# Patient Record
Sex: Female | Born: 1970 | Race: Black or African American | Hispanic: No | Marital: Single | Smoking: Former smoker
Health system: Southern US, Community
[De-identification: ages and names within clinical notes are randomized; demographics above are authoritative.]

## PROBLEM LIST (undated history)

## (undated) DIAGNOSIS — R0600 Dyspnea, unspecified: Secondary | ICD-10-CM

## (undated) DIAGNOSIS — R9431 Abnormal electrocardiogram [ECG] [EKG]: Secondary | ICD-10-CM

## (undated) DIAGNOSIS — I10 Essential (primary) hypertension: Secondary | ICD-10-CM

## (undated) DIAGNOSIS — Z720 Tobacco use: Secondary | ICD-10-CM

## (undated) DIAGNOSIS — E662 Morbid (severe) obesity with alveolar hypoventilation: Secondary | ICD-10-CM

## (undated) DIAGNOSIS — T4145XA Adverse effect of unspecified anesthetic, initial encounter: Secondary | ICD-10-CM

## (undated) DIAGNOSIS — N170 Acute kidney failure with tubular necrosis: Secondary | ICD-10-CM

## (undated) DIAGNOSIS — Z7952 Long term (current) use of systemic steroids: Secondary | ICD-10-CM

## (undated) DIAGNOSIS — J9621 Acute and chronic respiratory failure with hypoxia: Secondary | ICD-10-CM

## (undated) DIAGNOSIS — R7881 Bacteremia: Secondary | ICD-10-CM

## (undated) DIAGNOSIS — D649 Anemia, unspecified: Secondary | ICD-10-CM

## (undated) DIAGNOSIS — T884XXA Failed or difficult intubation, initial encounter: Secondary | ICD-10-CM

## (undated) DIAGNOSIS — R768 Other specified abnormal immunological findings in serum: Secondary | ICD-10-CM

## (undated) DIAGNOSIS — N182 Chronic kidney disease, stage 2 (mild): Secondary | ICD-10-CM

## (undated) DIAGNOSIS — N184 Chronic kidney disease, stage 4 (severe): Secondary | ICD-10-CM

## (undated) DIAGNOSIS — G4730 Sleep apnea, unspecified: Secondary | ICD-10-CM

## (undated) DIAGNOSIS — R7989 Other specified abnormal findings of blood chemistry: Secondary | ICD-10-CM

## (undated) DIAGNOSIS — J690 Pneumonitis due to inhalation of food and vomit: Secondary | ICD-10-CM

## (undated) DIAGNOSIS — J9610 Chronic respiratory failure, unspecified whether with hypoxia or hypercapnia: Secondary | ICD-10-CM

## (undated) DIAGNOSIS — T8859XA Other complications of anesthesia, initial encounter: Secondary | ICD-10-CM

## (undated) DIAGNOSIS — D869 Sarcoidosis, unspecified: Secondary | ICD-10-CM

## (undated) DIAGNOSIS — J9600 Acute respiratory failure, unspecified whether with hypoxia or hypercapnia: Secondary | ICD-10-CM

## (undated) DIAGNOSIS — Z0000 Encounter for general adult medical examination without abnormal findings: Secondary | ICD-10-CM

## (undated) DIAGNOSIS — I272 Pulmonary hypertension, unspecified: Secondary | ICD-10-CM

## (undated) DIAGNOSIS — I2729 Other secondary pulmonary hypertension: Secondary | ICD-10-CM

## (undated) DIAGNOSIS — I428 Other cardiomyopathies: Secondary | ICD-10-CM

## (undated) DIAGNOSIS — J9601 Acute respiratory failure with hypoxia: Secondary | ICD-10-CM

## (undated) DIAGNOSIS — N179 Acute kidney failure, unspecified: Secondary | ICD-10-CM

## (undated) DIAGNOSIS — R945 Abnormal results of liver function studies: Secondary | ICD-10-CM

## (undated) DIAGNOSIS — R0902 Hypoxemia: Secondary | ICD-10-CM

## (undated) DIAGNOSIS — Z9889 Other specified postprocedural states: Secondary | ICD-10-CM

## (undated) DIAGNOSIS — L219 Seborrheic dermatitis, unspecified: Secondary | ICD-10-CM

## (undated) DIAGNOSIS — I5032 Chronic diastolic (congestive) heart failure: Secondary | ICD-10-CM

## (undated) DIAGNOSIS — I469 Cardiac arrest, cause unspecified: Secondary | ICD-10-CM

## (undated) HISTORY — DX: Abnormal electrocardiogram (ECG) (EKG): R94.31

## (undated) HISTORY — DX: Morbid (severe) obesity with alveolar hypoventilation: E66.2

## (undated) HISTORY — DX: Chronic kidney disease, stage 4 (severe): N17.9

## (undated) HISTORY — DX: Acute respiratory failure with hypoxia: J96.01

## (undated) HISTORY — DX: Encounter for general adult medical examination without abnormal findings: Z00.00

## (undated) HISTORY — DX: Chronic kidney disease, stage 4 (severe): N18.4

## (undated) HISTORY — DX: Sarcoidosis, unspecified: D86.9

## (undated) HISTORY — DX: Tobacco use: Z72.0

## (undated) HISTORY — PX: TUBAL LIGATION: SHX77

## (undated) HISTORY — DX: Anemia, unspecified: D64.9

## (undated) HISTORY — DX: Bacteremia: R78.81

## (undated) HISTORY — DX: Morbid (severe) obesity due to excess calories: E66.01

## (undated) HISTORY — DX: Acute kidney failure with tubular necrosis: N17.0

## (undated) HISTORY — PX: BREAST SURGERY: SHX581

## (undated) HISTORY — DX: Pulmonary hypertension, unspecified: I27.20

## (undated) HISTORY — DX: Abnormal results of liver function studies: R94.5

## (undated) HISTORY — DX: Other specified postprocedural states: Z98.890

## (undated) HISTORY — DX: Failed or difficult intubation, initial encounter: T88.4XXA

## (undated) HISTORY — DX: Other specified abnormal findings of blood chemistry: R79.89

## (undated) HISTORY — DX: Chronic respiratory failure, unspecified whether with hypoxia or hypercapnia: J96.10

## (undated) HISTORY — DX: Hypoxemia: R09.02

## (undated) HISTORY — DX: Pneumonitis due to inhalation of food and vomit: J69.0

## (undated) HISTORY — DX: Other specified abnormal immunological findings in serum: R76.8

## (undated) HISTORY — DX: Acute and chronic respiratory failure with hypoxia: J96.21

## (undated) HISTORY — DX: Chronic diastolic (congestive) heart failure: I50.32

## (undated) HISTORY — DX: Cardiac arrest, cause unspecified: I46.9

## (undated) HISTORY — DX: Other cardiomyopathies: I42.8

## (undated) HISTORY — DX: Other secondary pulmonary hypertension: I27.29

## (undated) HISTORY — DX: Acute respiratory failure, unspecified whether with hypoxia or hypercapnia: J96.00

## (undated) HISTORY — DX: Chronic kidney disease, stage 2 (mild): N18.2

## (undated) HISTORY — DX: Essential (primary) hypertension: I10

## (undated) HISTORY — DX: Seborrheic dermatitis, unspecified: L21.9

## (undated) HISTORY — DX: Long term (current) use of systemic steroids: Z79.52

---

## 1999-03-14 ENCOUNTER — Ambulatory Visit (HOSPITAL_COMMUNITY): Admission: RE | Admit: 1999-03-14 | Discharge: 1999-03-14 | Payer: Self-pay | Admitting: Obstetrics & Gynecology

## 1999-04-20 ENCOUNTER — Ambulatory Visit (HOSPITAL_COMMUNITY): Admission: RE | Admit: 1999-04-20 | Discharge: 1999-04-20 | Payer: Self-pay | Admitting: *Deleted

## 1999-06-07 ENCOUNTER — Ambulatory Visit (HOSPITAL_COMMUNITY): Admission: RE | Admit: 1999-06-07 | Discharge: 1999-06-07 | Payer: Self-pay | Admitting: *Deleted

## 1999-07-07 ENCOUNTER — Inpatient Hospital Stay (HOSPITAL_COMMUNITY): Admission: AD | Admit: 1999-07-07 | Discharge: 1999-07-07 | Payer: Self-pay | Admitting: *Deleted

## 1999-07-21 ENCOUNTER — Encounter: Payer: Self-pay | Admitting: Obstetrics & Gynecology

## 1999-07-21 ENCOUNTER — Inpatient Hospital Stay (HOSPITAL_COMMUNITY): Admission: AD | Admit: 1999-07-21 | Discharge: 1999-07-27 | Payer: Self-pay | Admitting: Obstetrics & Gynecology

## 1999-07-21 ENCOUNTER — Encounter (INDEPENDENT_AMBULATORY_CARE_PROVIDER_SITE_OTHER): Payer: Self-pay | Admitting: Specialist

## 1999-07-22 ENCOUNTER — Encounter: Payer: Self-pay | Admitting: Obstetrics

## 1999-07-23 ENCOUNTER — Encounter: Payer: Self-pay | Admitting: Pulmonary Disease

## 1999-07-29 ENCOUNTER — Inpatient Hospital Stay (HOSPITAL_COMMUNITY): Admission: EM | Admit: 1999-07-29 | Discharge: 1999-07-29 | Payer: Self-pay | Admitting: Obstetrics

## 1999-08-04 ENCOUNTER — Encounter: Payer: Self-pay | Admitting: *Deleted

## 1999-08-04 ENCOUNTER — Encounter: Payer: Self-pay | Admitting: Internal Medicine

## 1999-08-04 ENCOUNTER — Encounter: Payer: Self-pay | Admitting: Emergency Medicine

## 1999-08-04 ENCOUNTER — Inpatient Hospital Stay (HOSPITAL_COMMUNITY): Admission: EM | Admit: 1999-08-04 | Discharge: 1999-08-10 | Payer: Self-pay | Admitting: Emergency Medicine

## 1999-08-04 ENCOUNTER — Inpatient Hospital Stay (HOSPITAL_COMMUNITY): Admission: AD | Admit: 1999-08-04 | Discharge: 1999-08-04 | Payer: Self-pay | Admitting: Obstetrics & Gynecology

## 1999-08-08 ENCOUNTER — Encounter: Payer: Self-pay | Admitting: Internal Medicine

## 1999-08-25 ENCOUNTER — Encounter: Admission: RE | Admit: 1999-08-25 | Discharge: 1999-08-25 | Payer: Self-pay | Admitting: Internal Medicine

## 1999-10-17 ENCOUNTER — Encounter: Admission: RE | Admit: 1999-10-17 | Discharge: 1999-10-17 | Payer: Self-pay | Admitting: Hematology and Oncology

## 2000-03-29 ENCOUNTER — Encounter: Admission: RE | Admit: 2000-03-29 | Discharge: 2000-03-29 | Payer: Self-pay | Admitting: Internal Medicine

## 2001-02-05 ENCOUNTER — Encounter: Admission: RE | Admit: 2001-02-05 | Discharge: 2001-02-05 | Payer: Self-pay | Admitting: Internal Medicine

## 2002-06-18 ENCOUNTER — Encounter: Admission: RE | Admit: 2002-06-18 | Discharge: 2002-06-18 | Payer: Self-pay | Admitting: Internal Medicine

## 2003-10-14 ENCOUNTER — Inpatient Hospital Stay (HOSPITAL_COMMUNITY): Admission: EM | Admit: 2003-10-14 | Discharge: 2003-10-26 | Payer: Self-pay | Admitting: Emergency Medicine

## 2004-07-04 ENCOUNTER — Ambulatory Visit: Payer: Self-pay | Admitting: Internal Medicine

## 2004-07-06 ENCOUNTER — Ambulatory Visit: Payer: Self-pay | Admitting: Internal Medicine

## 2004-07-18 ENCOUNTER — Ambulatory Visit: Payer: Self-pay | Admitting: Internal Medicine

## 2004-07-20 ENCOUNTER — Ambulatory Visit: Payer: Self-pay | Admitting: Internal Medicine

## 2004-07-25 ENCOUNTER — Ambulatory Visit: Payer: Self-pay | Admitting: Internal Medicine

## 2004-10-26 ENCOUNTER — Ambulatory Visit: Payer: Self-pay | Admitting: Internal Medicine

## 2004-12-24 ENCOUNTER — Emergency Department (HOSPITAL_COMMUNITY): Admission: EM | Admit: 2004-12-24 | Discharge: 2004-12-24 | Payer: Self-pay | Admitting: Emergency Medicine

## 2004-12-26 ENCOUNTER — Emergency Department (HOSPITAL_COMMUNITY): Admission: EM | Admit: 2004-12-26 | Discharge: 2004-12-26 | Payer: Self-pay | Admitting: Emergency Medicine

## 2005-03-29 ENCOUNTER — Ambulatory Visit: Payer: Self-pay | Admitting: Internal Medicine

## 2005-04-12 ENCOUNTER — Ambulatory Visit: Payer: Self-pay | Admitting: Internal Medicine

## 2005-04-14 ENCOUNTER — Ambulatory Visit (HOSPITAL_COMMUNITY): Admission: RE | Admit: 2005-04-14 | Discharge: 2005-04-14 | Payer: Self-pay | Admitting: Internal Medicine

## 2005-04-17 ENCOUNTER — Ambulatory Visit: Payer: Self-pay | Admitting: Internal Medicine

## 2005-04-27 ENCOUNTER — Encounter: Admission: RE | Admit: 2005-04-27 | Discharge: 2005-04-27 | Payer: Self-pay | Admitting: Surgery

## 2005-05-02 ENCOUNTER — Ambulatory Visit (HOSPITAL_COMMUNITY): Admission: RE | Admit: 2005-05-02 | Discharge: 2005-05-02 | Payer: Self-pay | Admitting: Surgery

## 2005-05-02 ENCOUNTER — Encounter (INDEPENDENT_AMBULATORY_CARE_PROVIDER_SITE_OTHER): Payer: Self-pay | Admitting: *Deleted

## 2005-06-23 ENCOUNTER — Ambulatory Visit: Payer: Self-pay | Admitting: Internal Medicine

## 2005-07-07 ENCOUNTER — Ambulatory Visit: Payer: Self-pay | Admitting: Internal Medicine

## 2006-05-30 ENCOUNTER — Ambulatory Visit: Payer: Self-pay | Admitting: *Deleted

## 2006-05-30 ENCOUNTER — Inpatient Hospital Stay (HOSPITAL_COMMUNITY): Admission: EM | Admit: 2006-05-30 | Discharge: 2006-06-06 | Payer: Self-pay | Admitting: Emergency Medicine

## 2006-06-01 DIAGNOSIS — Z9889 Other specified postprocedural states: Secondary | ICD-10-CM

## 2006-06-01 HISTORY — DX: Other specified postprocedural states: Z98.890

## 2006-06-05 ENCOUNTER — Encounter (INDEPENDENT_AMBULATORY_CARE_PROVIDER_SITE_OTHER): Payer: Self-pay | Admitting: Cardiovascular Disease

## 2006-07-02 DIAGNOSIS — I10 Essential (primary) hypertension: Secondary | ICD-10-CM | POA: Insufficient documentation

## 2006-07-02 DIAGNOSIS — O159 Eclampsia, unspecified as to time period: Secondary | ICD-10-CM | POA: Insufficient documentation

## 2006-07-02 DIAGNOSIS — Z87898 Personal history of other specified conditions: Secondary | ICD-10-CM | POA: Insufficient documentation

## 2006-07-11 ENCOUNTER — Ambulatory Visit: Payer: Self-pay | Admitting: *Deleted

## 2006-07-11 ENCOUNTER — Encounter (INDEPENDENT_AMBULATORY_CARE_PROVIDER_SITE_OTHER): Payer: Self-pay | Admitting: *Deleted

## 2006-07-11 LAB — CONVERTED CEMR LAB
BUN: 17 mg/dL (ref 6–23)
Blood Glucose, Fingerstick: 176
CO2: 24 meq/L (ref 19–32)
Calcium: 8.7 mg/dL (ref 8.4–10.5)
Chloride: 103 meq/L (ref 96–112)
Creatinine, Ser: 0.73 mg/dL (ref 0.40–1.20)
Glucose, Bld: 169 mg/dL — ABNORMAL HIGH (ref 70–99)
Hgb A1c MFr Bld: 6.2 %
Potassium: 3.6 meq/L (ref 3.5–5.3)
Sodium: 137 meq/L (ref 135–145)

## 2006-09-05 ENCOUNTER — Ambulatory Visit: Payer: Self-pay | Admitting: Internal Medicine

## 2006-10-04 ENCOUNTER — Ambulatory Visit: Payer: Self-pay | Admitting: Internal Medicine

## 2006-10-04 LAB — CONVERTED CEMR LAB
ALT: 26 units/L (ref 0–40)
AST: 40 units/L — ABNORMAL HIGH (ref 0–37)
Albumin: 2.7 g/dL — ABNORMAL LOW (ref 3.5–5.2)
Alkaline Phosphatase: 104 units/L (ref 39–117)
Angiotensin 1 Converting Enzyme: 143 units/L — ABNORMAL HIGH (ref 9–67)
Basophils Absolute: 0 10*3/uL (ref 0.0–0.1)
Basophils Relative: 0.3 % (ref 0.0–1.0)
Bilirubin, Direct: 0.1 mg/dL (ref 0.0–0.3)
Eosinophils Absolute: 0.2 10*3/uL (ref 0.0–0.6)
Eosinophils Relative: 2.2 % (ref 0.0–5.0)
HCT: 36.5 % (ref 36.0–46.0)
Hemoglobin: 12.6 g/dL (ref 12.0–15.0)
Lymphocytes Relative: 30 % (ref 12.0–46.0)
MCHC: 34.5 g/dL (ref 30.0–36.0)
MCV: 87.5 fL (ref 78.0–100.0)
Monocytes Absolute: 0.6 10*3/uL (ref 0.2–0.7)
Monocytes Relative: 7.7 % (ref 3.0–11.0)
Neutro Abs: 5.1 10*3/uL (ref 1.4–7.7)
Neutrophils Relative %: 59.8 % (ref 43.0–77.0)
Platelets: 285 10*3/uL (ref 150–400)
RBC: 4.17 M/uL (ref 3.87–5.11)
RDW: 13.3 % (ref 11.5–14.6)
Sed Rate: 67 mm/hr — ABNORMAL HIGH (ref 0–25)
Total Bilirubin: 0.7 mg/dL (ref 0.3–1.2)
Total Protein: 8.1 g/dL (ref 6.0–8.3)
WBC: 8.4 10*3/uL (ref 4.5–10.5)

## 2006-10-15 ENCOUNTER — Ambulatory Visit: Payer: Self-pay | Admitting: Internal Medicine

## 2006-12-19 ENCOUNTER — Ambulatory Visit: Payer: Self-pay | Admitting: Internal Medicine

## 2007-01-17 ENCOUNTER — Ambulatory Visit: Payer: Self-pay | Admitting: Internal Medicine

## 2007-02-10 DIAGNOSIS — Z794 Long term (current) use of insulin: Secondary | ICD-10-CM

## 2007-02-10 DIAGNOSIS — E119 Type 2 diabetes mellitus without complications: Secondary | ICD-10-CM | POA: Insufficient documentation

## 2007-02-28 ENCOUNTER — Ambulatory Visit: Payer: Self-pay | Admitting: Internal Medicine

## 2007-03-20 ENCOUNTER — Encounter: Payer: Self-pay | Admitting: Internal Medicine

## 2007-03-20 DIAGNOSIS — J9612 Chronic respiratory failure with hypercapnia: Secondary | ICD-10-CM | POA: Insufficient documentation

## 2007-04-16 ENCOUNTER — Ambulatory Visit: Payer: Self-pay | Admitting: Internal Medicine

## 2008-03-11 ENCOUNTER — Ambulatory Visit (HOSPITAL_COMMUNITY): Admission: RE | Admit: 2008-03-11 | Discharge: 2008-03-11 | Payer: Self-pay | Admitting: *Deleted

## 2008-03-11 ENCOUNTER — Encounter (INDEPENDENT_AMBULATORY_CARE_PROVIDER_SITE_OTHER): Payer: Self-pay | Admitting: Internal Medicine

## 2008-03-11 ENCOUNTER — Ambulatory Visit: Payer: Self-pay | Admitting: Cardiology

## 2008-03-11 ENCOUNTER — Ambulatory Visit: Payer: Self-pay | Admitting: *Deleted

## 2008-03-11 LAB — CONVERTED CEMR LAB
Blood Glucose, Fingerstick: 110
Hgb A1c MFr Bld: 5 %

## 2008-03-12 LAB — CONVERTED CEMR LAB
Candida species: NEGATIVE
Gardnerella vaginalis: POSITIVE — AB
Trichomonal Vaginitis: NEGATIVE

## 2008-03-15 LAB — CONVERTED CEMR LAB
ALT: 18 units/L (ref 0–35)
AST: 52 units/L — ABNORMAL HIGH (ref 0–37)
Albumin: 2.3 g/dL — ABNORMAL LOW (ref 3.5–5.2)
Alkaline Phosphatase: 713 units/L — ABNORMAL HIGH (ref 39–117)
BUN: 9 mg/dL (ref 6–23)
Basophils Absolute: 0 10*3/uL (ref 0.0–0.1)
Basophils Relative: 1 % (ref 0–1)
Beta hcg, urine, semiquantitative: NEGATIVE
CO2: 22 meq/L (ref 19–32)
Calcium: 8.8 mg/dL (ref 8.4–10.5)
Chlamydia, DNA Probe: NEGATIVE
Chloride: 99 meq/L (ref 96–112)
Creatinine, Ser: 0.51 mg/dL (ref 0.40–1.20)
Creatinine, Urine: 78.3 mg/dL
Eosinophils Absolute: 0.2 10*3/uL (ref 0.0–0.7)
Eosinophils Relative: 3 % (ref 0–5)
Free T4: 1.25 ng/dL (ref 0.89–1.80)
GC Probe Amp, Genital: NEGATIVE
Glucose, Bld: 72 mg/dL (ref 70–99)
HCT: 34.2 % — ABNORMAL LOW (ref 36.0–46.0)
Hemoglobin: 10.9 g/dL — ABNORMAL LOW (ref 12.0–15.0)
Lymphocytes Relative: 22 % (ref 12–46)
Lymphs Abs: 1.1 10*3/uL (ref 0.7–4.0)
MCHC: 31.9 g/dL (ref 30.0–36.0)
MCV: 91.2 fL (ref 78.0–100.0)
Magnesium: 1.8 mg/dL (ref 1.5–2.5)
Microalb Creat Ratio: 7 mg/g (ref 0.0–30.0)
Microalb, Ur: 0.55 mg/dL (ref 0.00–1.89)
Monocytes Absolute: 0.6 10*3/uL (ref 0.1–1.0)
Monocytes Relative: 13 % — ABNORMAL HIGH (ref 3–12)
Neutro Abs: 3 10*3/uL (ref 1.7–7.7)
Neutrophils Relative %: 61 % (ref 43–77)
Platelets: 284 10*3/uL (ref 150–400)
Potassium: 3.9 meq/L (ref 3.5–5.3)
RBC: 3.75 M/uL — ABNORMAL LOW (ref 3.87–5.11)
RDW: 16.8 % — ABNORMAL HIGH (ref 11.5–15.5)
Sodium: 134 meq/L — ABNORMAL LOW (ref 135–145)
TSH: 2.802 microintl units/mL (ref 0.350–4.50)
Total Bilirubin: 1.3 mg/dL — ABNORMAL HIGH (ref 0.3–1.2)
Total Protein: 8.4 g/dL — ABNORMAL HIGH (ref 6.0–8.3)
WBC: 4.9 10*3/uL (ref 4.0–10.5)

## 2008-03-16 ENCOUNTER — Encounter (INDEPENDENT_AMBULATORY_CARE_PROVIDER_SITE_OTHER): Payer: Self-pay | Admitting: Internal Medicine

## 2008-03-17 ENCOUNTER — Ambulatory Visit (HOSPITAL_COMMUNITY): Admission: RE | Admit: 2008-03-17 | Discharge: 2008-03-17 | Payer: Self-pay | Admitting: *Deleted

## 2008-03-18 ENCOUNTER — Encounter (INDEPENDENT_AMBULATORY_CARE_PROVIDER_SITE_OTHER): Payer: Self-pay | Admitting: *Deleted

## 2008-03-18 ENCOUNTER — Ambulatory Visit (HOSPITAL_COMMUNITY): Admission: RE | Admit: 2008-03-18 | Discharge: 2008-03-18 | Payer: Self-pay | Admitting: *Deleted

## 2008-03-23 ENCOUNTER — Encounter (INDEPENDENT_AMBULATORY_CARE_PROVIDER_SITE_OTHER): Payer: Self-pay | Admitting: Internal Medicine

## 2008-03-23 ENCOUNTER — Ambulatory Visit: Payer: Self-pay | Admitting: *Deleted

## 2008-03-23 DIAGNOSIS — R160 Hepatomegaly, not elsewhere classified: Secondary | ICD-10-CM | POA: Insufficient documentation

## 2008-04-08 ENCOUNTER — Ambulatory Visit: Payer: Self-pay | Admitting: Internal Medicine

## 2008-04-08 ENCOUNTER — Encounter (INDEPENDENT_AMBULATORY_CARE_PROVIDER_SITE_OTHER): Payer: Self-pay | Admitting: Internal Medicine

## 2008-04-08 ENCOUNTER — Ambulatory Visit (HOSPITAL_COMMUNITY): Admission: RE | Admit: 2008-04-08 | Discharge: 2008-04-08 | Payer: Self-pay | Admitting: Internal Medicine

## 2008-04-08 LAB — CONVERTED CEMR LAB
Ferritin: 57 ng/mL (ref 10–291)
HCV Ab: REACTIVE — AB
Hep B Core Total Ab: NEGATIVE
Hep B S Ab: NEGATIVE
Hepatitis B Surface Ag: NEGATIVE
INR: 1.2 (ref 0.0–1.5)
Iron: 41 ug/dL — ABNORMAL LOW (ref 42–145)
Prothrombin Time: 15.9 s — ABNORMAL HIGH (ref 11.6–15.2)
RBC Folate: 671 ng/mL — ABNORMAL HIGH (ref 180–600)
Saturation Ratios: 15 % — ABNORMAL LOW (ref 20–55)
TIBC: 282 ug/dL (ref 250–470)
UIBC: 241 ug/dL
Vitamin B-12: 412 pg/mL (ref 211–911)
aPTT: 43 s — ABNORMAL HIGH (ref 24–37)

## 2008-04-14 ENCOUNTER — Ambulatory Visit: Payer: Self-pay | Admitting: Internal Medicine

## 2008-04-15 LAB — CONVERTED CEMR LAB
ALT: 50 units/L — ABNORMAL HIGH (ref 0–35)
AST: 32 units/L (ref 0–37)
Albumin: 3.3 g/dL — ABNORMAL LOW (ref 3.5–5.2)
Alkaline Phosphatase: 358 units/L — ABNORMAL HIGH (ref 39–117)
BUN: 16 mg/dL (ref 6–23)
CO2: 22 meq/L (ref 19–32)
Calcium: 9.5 mg/dL (ref 8.4–10.5)
Chloride: 103 meq/L (ref 96–112)
Creatinine, Ser: 0.6 mg/dL (ref 0.40–1.20)
Glucose, Bld: 130 mg/dL — ABNORMAL HIGH (ref 70–99)
Potassium: 3.8 meq/L (ref 3.5–5.3)
Sodium: 137 meq/L (ref 135–145)
Total Bilirubin: 0.8 mg/dL (ref 0.3–1.2)
Total Protein: 8.2 g/dL (ref 6.0–8.3)

## 2008-04-22 ENCOUNTER — Encounter (INDEPENDENT_AMBULATORY_CARE_PROVIDER_SITE_OTHER): Payer: Self-pay | Admitting: Internal Medicine

## 2008-04-22 ENCOUNTER — Ambulatory Visit: Payer: Self-pay | Admitting: Gastroenterology

## 2008-04-23 ENCOUNTER — Ambulatory Visit (HOSPITAL_COMMUNITY): Admission: RE | Admit: 2008-04-23 | Discharge: 2008-04-23 | Payer: Self-pay | Admitting: Gastroenterology

## 2008-04-27 ENCOUNTER — Encounter: Payer: Self-pay | Admitting: Gastroenterology

## 2008-04-29 ENCOUNTER — Encounter (INDEPENDENT_AMBULATORY_CARE_PROVIDER_SITE_OTHER): Payer: Self-pay | Admitting: Interventional Radiology

## 2008-04-29 ENCOUNTER — Ambulatory Visit (HOSPITAL_COMMUNITY): Admission: RE | Admit: 2008-04-29 | Discharge: 2008-04-29 | Payer: Self-pay | Admitting: Gastroenterology

## 2008-05-22 ENCOUNTER — Ambulatory Visit: Payer: Self-pay | Admitting: Internal Medicine

## 2008-05-22 DIAGNOSIS — D869 Sarcoidosis, unspecified: Secondary | ICD-10-CM | POA: Insufficient documentation

## 2008-05-28 ENCOUNTER — Telehealth (INDEPENDENT_AMBULATORY_CARE_PROVIDER_SITE_OTHER): Payer: Self-pay | Admitting: Internal Medicine

## 2008-06-12 ENCOUNTER — Ambulatory Visit: Payer: Self-pay | Admitting: Internal Medicine

## 2008-06-12 DIAGNOSIS — K219 Gastro-esophageal reflux disease without esophagitis: Secondary | ICD-10-CM | POA: Insufficient documentation

## 2008-07-27 ENCOUNTER — Ambulatory Visit: Payer: Self-pay | Admitting: Internal Medicine

## 2008-07-28 ENCOUNTER — Ambulatory Visit: Payer: Self-pay | Admitting: Internal Medicine

## 2008-07-28 ENCOUNTER — Encounter (INDEPENDENT_AMBULATORY_CARE_PROVIDER_SITE_OTHER): Payer: Self-pay | Admitting: Internal Medicine

## 2008-07-28 LAB — CONVERTED CEMR LAB
ALT: 30 units/L (ref 0–35)
AST: 46 units/L — ABNORMAL HIGH (ref 0–37)
Albumin: 3.1 g/dL — ABNORMAL LOW (ref 3.5–5.2)
Alkaline Phosphatase: 459 units/L — ABNORMAL HIGH (ref 39–117)
BUN: 14 mg/dL (ref 6–23)
CO2: 23 meq/L (ref 19–32)
Calcium: 10.1 mg/dL (ref 8.4–10.5)
Chloride: 99 meq/L (ref 96–112)
Cholesterol: 171 mg/dL (ref 0–200)
Creatinine, Ser: 0.6 mg/dL (ref 0.40–1.20)
GFR calc Af Amer: >60 mL/min (ref >60)
GFR calc non Af Amer: >60 mL/min (ref >60)
Glucose, Bld: 102 mg/dL — ABNORMAL HIGH (ref 70–99)
HCT: 34.4 % — ABNORMAL LOW (ref 36.0–46.0)
HDL: 26 mg/dL — ABNORMAL LOW (ref >39)
Hemoglobin: 11.5 g/dL — ABNORMAL LOW (ref 12.0–15.0)
LDL Cholesterol: 115 mg/dL — ABNORMAL HIGH (ref 0–99)
MCHC: 33.4 g/dL (ref 30.0–36.0)
MCV: 87.5 fL (ref 78.0–100.0)
Platelets: 238 10*3/uL (ref 150–400)
Potassium: 4 meq/L (ref 3.5–5.3)
RBC: 3.93 M/uL (ref 3.87–5.11)
RDW: 15.8 % — ABNORMAL HIGH (ref 11.5–15.5)
Sodium: 137 meq/L (ref 135–145)
Total Bilirubin: 1.4 mg/dL — ABNORMAL HIGH (ref 0.3–1.2)
Total CHOL/HDL Ratio: 6.6
Total Protein: 8.3 g/dL (ref 6.0–8.3)
Triglycerides: 151 mg/dL — ABNORMAL HIGH (ref <150)
VLDL: 30 mg/dL (ref 0–40)
WBC: 5.1 10*3/uL (ref 4.0–10.5)

## 2008-08-08 ENCOUNTER — Inpatient Hospital Stay (HOSPITAL_COMMUNITY): Admission: EM | Admit: 2008-08-08 | Discharge: 2008-08-09 | Payer: Self-pay | Admitting: Emergency Medicine

## 2008-08-08 ENCOUNTER — Encounter: Payer: Self-pay | Admitting: Internal Medicine

## 2008-08-08 ENCOUNTER — Ambulatory Visit: Payer: Self-pay | Admitting: *Deleted

## 2008-08-09 ENCOUNTER — Encounter: Payer: Self-pay | Admitting: Internal Medicine

## 2008-08-13 ENCOUNTER — Telehealth: Payer: Self-pay | Admitting: Internal Medicine

## 2008-08-18 ENCOUNTER — Encounter: Payer: Self-pay | Admitting: Internal Medicine

## 2008-08-19 ENCOUNTER — Ambulatory Visit: Payer: Self-pay | Admitting: Internal Medicine

## 2008-08-19 ENCOUNTER — Encounter: Payer: Self-pay | Admitting: Internal Medicine

## 2008-08-20 LAB — CONVERTED CEMR LAB
ALT: 30 units/L (ref 0–35)
AST: 38 units/L — ABNORMAL HIGH (ref 0–37)
Albumin: 2.4 g/dL — ABNORMAL LOW (ref 3.5–5.2)
Alkaline Phosphatase: 449 units/L — ABNORMAL HIGH (ref 39–117)
BUN: 15 mg/dL (ref 6–23)
CO2: 25 meq/L (ref 19–32)
Calcium: 9.9 mg/dL (ref 8.4–10.5)
Chloride: 104 meq/L (ref 96–112)
Creatinine, Ser: 0.84 mg/dL (ref 0.40–1.20)
GFR calc Af Amer: >60 mL/min (ref >60)
GFR calc non Af Amer: >60 mL/min (ref >60)
Glucose, Bld: 175 mg/dL — ABNORMAL HIGH (ref 70–99)
INR: 1.1 (ref 0.0–1.5)
Potassium: 3.7 meq/L (ref 3.5–5.3)
Prothrombin Time: 14.8 s (ref 11.6–15.2)
Sodium: 140 meq/L (ref 135–145)
Total Bilirubin: 0.8 mg/dL (ref 0.3–1.2)
Total Protein: 7.9 g/dL (ref 6.0–8.3)

## 2008-09-02 ENCOUNTER — Telehealth: Payer: Self-pay | Admitting: Licensed Clinical Social Worker

## 2008-09-21 ENCOUNTER — Encounter (INDEPENDENT_AMBULATORY_CARE_PROVIDER_SITE_OTHER): Payer: Self-pay | Admitting: Internal Medicine

## 2008-09-21 ENCOUNTER — Ambulatory Visit: Payer: Self-pay | Admitting: Gastroenterology

## 2008-10-05 ENCOUNTER — Ambulatory Visit: Payer: Self-pay | Admitting: Internal Medicine

## 2008-10-05 LAB — CONVERTED CEMR LAB: Hgb A1c MFr Bld: 7.6 %

## 2008-11-23 ENCOUNTER — Ambulatory Visit: Payer: Self-pay | Admitting: Internal Medicine

## 2008-11-23 ENCOUNTER — Encounter (INDEPENDENT_AMBULATORY_CARE_PROVIDER_SITE_OTHER): Payer: Self-pay | Admitting: Internal Medicine

## 2008-11-23 LAB — CONVERTED CEMR LAB: Blood Glucose, Fingerstick: 390

## 2008-11-27 LAB — CONVERTED CEMR LAB
HCT: 37.6 % (ref 36.0–46.0)
Hemoglobin: 12.6 g/dL (ref 12.0–15.0)
INR: 1 (ref 0.0–1.5)
MCHC: 33.4 g/dL (ref 30.0–36.0)
MCV: 84.8 fL (ref 78.0–100.0)
Platelets: 171 10*3/uL (ref 150–400)
Prothrombin Time: 13 s (ref 11.6–15.2)
RBC: 4.43 M/uL (ref 3.87–5.11)
RDW: 16.9 % — ABNORMAL HIGH (ref 11.5–15.5)
WBC: 7 10*3/uL (ref 4.0–10.5)
aPTT: 25 s (ref 24–37)

## 2008-12-16 ENCOUNTER — Telehealth (INDEPENDENT_AMBULATORY_CARE_PROVIDER_SITE_OTHER): Payer: Self-pay | Admitting: Internal Medicine

## 2008-12-18 ENCOUNTER — Telehealth (INDEPENDENT_AMBULATORY_CARE_PROVIDER_SITE_OTHER): Payer: Self-pay | Admitting: Internal Medicine

## 2009-01-21 ENCOUNTER — Ambulatory Visit: Payer: Self-pay | Admitting: Internal Medicine

## 2009-01-21 LAB — CONVERTED CEMR LAB: Hgb A1c MFr Bld: >14 %

## 2009-01-25 ENCOUNTER — Telehealth: Payer: Self-pay | Admitting: *Deleted

## 2009-01-25 LAB — CONVERTED CEMR LAB
ALT: 37 units/L — ABNORMAL HIGH (ref 0–35)
AST: 27 units/L (ref 0–37)
Albumin: 3.2 g/dL — ABNORMAL LOW (ref 3.5–5.2)
Alkaline Phosphatase: 172 units/L — ABNORMAL HIGH (ref 39–117)
BUN: 13 mg/dL (ref 6–23)
Basophils Absolute: 0 10*3/uL (ref 0.0–0.1)
Basophils Relative: 0 % (ref 0–1)
CO2: 28 meq/L (ref 19–32)
Calcium: 10.1 mg/dL (ref 8.4–10.5)
Chloride: 99 meq/L (ref 96–112)
Cholesterol: 278 mg/dL — ABNORMAL HIGH (ref 0–200)
Creatinine, Ser: 0.97 mg/dL (ref 0.40–1.20)
Eosinophils Absolute: 0 10*3/uL (ref 0.0–0.7)
Eosinophils Relative: 0 % (ref 0–5)
Glucose, Bld: 708 mg/dL (ref 70–99)
HCT: 39.7 % (ref 36.0–46.0)
HDL: 56 mg/dL (ref >39)
Hemoglobin: 13 g/dL (ref 12.0–15.0)
Lymphocytes Relative: 14 % (ref 12–46)
Lymphs Abs: 0.9 10*3/uL (ref 0.7–4.0)
MCHC: 32.8 g/dL (ref 30.0–36.0)
MCV: 87.8 fL (ref >=78.0)
Monocytes Absolute: 0.4 10*3/uL (ref 0.1–1.0)
Monocytes Relative: 6 % (ref 3–12)
Neutro Abs: 5.4 10*3/uL (ref 1.7–7.7)
Neutrophils Relative %: 80 % — ABNORMAL HIGH (ref 43–77)
Platelets: 201 10*3/uL (ref 150–400)
Potassium: 4.2 meq/L (ref 3.5–5.3)
RBC: 4.52 M/uL (ref 3.87–5.11)
RDW: 16.8 % — ABNORMAL HIGH (ref 11.5–15.5)
Sodium: 138 meq/L (ref 135–145)
Total Bilirubin: 0.6 mg/dL (ref 0.3–1.2)
Total CHOL/HDL Ratio: 5
Total Protein: 7.5 g/dL (ref 6.0–8.3)
Triglycerides: 403 mg/dL — ABNORMAL HIGH (ref <150)
WBC: 6.8 10*3/uL (ref 4.0–10.5)

## 2009-01-28 ENCOUNTER — Ambulatory Visit: Payer: Self-pay | Admitting: Internal Medicine

## 2009-01-28 LAB — CONVERTED CEMR LAB: Blood Glucose, Home Monitor: 2 mg/dL

## 2009-02-09 ENCOUNTER — Encounter (INDEPENDENT_AMBULATORY_CARE_PROVIDER_SITE_OTHER): Payer: Self-pay | Admitting: Internal Medicine

## 2009-02-09 ENCOUNTER — Ambulatory Visit: Payer: Self-pay | Admitting: Internal Medicine

## 2009-02-09 LAB — CONVERTED CEMR LAB: Blood Glucose, Fingerstick: 468

## 2009-02-11 LAB — CONVERTED CEMR LAB: Direct LDL: 126 mg/dL — ABNORMAL HIGH

## 2009-02-25 ENCOUNTER — Ambulatory Visit (HOSPITAL_COMMUNITY): Admission: RE | Admit: 2009-02-25 | Discharge: 2009-02-25 | Payer: Self-pay | Admitting: Internal Medicine

## 2009-02-25 ENCOUNTER — Encounter (INDEPENDENT_AMBULATORY_CARE_PROVIDER_SITE_OTHER): Payer: Self-pay | Admitting: Internal Medicine

## 2009-03-02 ENCOUNTER — Telehealth: Payer: Self-pay | Admitting: Internal Medicine

## 2009-03-03 ENCOUNTER — Ambulatory Visit: Payer: Self-pay | Admitting: Internal Medicine

## 2009-03-03 DIAGNOSIS — L538 Other specified erythematous conditions: Secondary | ICD-10-CM | POA: Insufficient documentation

## 2009-03-03 LAB — CONVERTED CEMR LAB: Blood Glucose, Fingerstick: 264

## 2009-03-04 ENCOUNTER — Encounter (INDEPENDENT_AMBULATORY_CARE_PROVIDER_SITE_OTHER): Payer: Self-pay | Admitting: Internal Medicine

## 2009-04-06 ENCOUNTER — Ambulatory Visit: Payer: Self-pay | Admitting: Internal Medicine

## 2009-04-06 DIAGNOSIS — Z6841 Body Mass Index (BMI) 40.0 and over, adult: Secondary | ICD-10-CM | POA: Insufficient documentation

## 2009-04-06 DIAGNOSIS — R609 Edema, unspecified: Secondary | ICD-10-CM | POA: Insufficient documentation

## 2009-04-06 DIAGNOSIS — F17200 Nicotine dependence, unspecified, uncomplicated: Secondary | ICD-10-CM | POA: Insufficient documentation

## 2009-04-06 LAB — CONVERTED CEMR LAB
Blood Glucose, Fingerstick: 374
Hgb A1c MFr Bld: 13.1 %

## 2009-04-12 ENCOUNTER — Telehealth: Payer: Self-pay | Admitting: Licensed Clinical Social Worker

## 2009-04-12 DIAGNOSIS — N049 Nephrotic syndrome with unspecified morphologic changes: Secondary | ICD-10-CM | POA: Insufficient documentation

## 2009-04-12 LAB — CONVERTED CEMR LAB
ALT: 23 units/L (ref 0–35)
AST: 18 units/L (ref 0–37)
Albumin: 3.6 g/dL (ref 3.5–5.2)
Alkaline Phosphatase: 158 units/L — ABNORMAL HIGH (ref 39–117)
BUN: 9 mg/dL (ref 6–23)
CO2: 24 meq/L (ref 19–32)
Calcium: 10.2 mg/dL (ref 8.4–10.5)
Chloride: 100 meq/L (ref 96–112)
Creatinine, Ser: 0.67 mg/dL (ref 0.40–1.20)
Creatinine, Urine: 27.5 mg/dL
Glucose, Bld: 353 mg/dL — ABNORMAL HIGH (ref 70–99)
Microalb Creat Ratio: 3417.1 mg/g — ABNORMAL HIGH (ref 0.0–30.0)
Microalb, Ur: 93.97 mg/dL — ABNORMAL HIGH (ref 0.00–1.89)
Potassium: 4 meq/L (ref 3.5–5.3)
Sodium: 139 meq/L (ref 135–145)
Total Bilirubin: 0.4 mg/dL (ref 0.3–1.2)
Total Protein: 7.2 g/dL (ref 6.0–8.3)

## 2009-04-16 ENCOUNTER — Telehealth (INDEPENDENT_AMBULATORY_CARE_PROVIDER_SITE_OTHER): Payer: Self-pay | Admitting: Internal Medicine

## 2009-04-19 ENCOUNTER — Telehealth: Payer: Self-pay | Admitting: *Deleted

## 2009-04-20 ENCOUNTER — Encounter: Admission: RE | Admit: 2009-04-20 | Discharge: 2009-04-20 | Payer: Self-pay | Admitting: Internal Medicine

## 2009-04-22 ENCOUNTER — Telehealth (INDEPENDENT_AMBULATORY_CARE_PROVIDER_SITE_OTHER): Payer: Self-pay | Admitting: *Deleted

## 2009-04-26 ENCOUNTER — Inpatient Hospital Stay (HOSPITAL_COMMUNITY): Admission: EM | Admit: 2009-04-26 | Discharge: 2009-04-29 | Payer: Self-pay | Admitting: Emergency Medicine

## 2009-04-26 ENCOUNTER — Encounter (INDEPENDENT_AMBULATORY_CARE_PROVIDER_SITE_OTHER): Payer: Self-pay | Admitting: Internal Medicine

## 2009-04-26 ENCOUNTER — Ambulatory Visit: Payer: Self-pay | Admitting: Internal Medicine

## 2009-04-27 ENCOUNTER — Encounter: Payer: Self-pay | Admitting: Internal Medicine

## 2009-04-28 ENCOUNTER — Encounter (INDEPENDENT_AMBULATORY_CARE_PROVIDER_SITE_OTHER): Payer: Self-pay | Admitting: Internal Medicine

## 2009-04-28 DIAGNOSIS — I5032 Chronic diastolic (congestive) heart failure: Secondary | ICD-10-CM | POA: Insufficient documentation

## 2009-05-06 ENCOUNTER — Ambulatory Visit: Payer: Self-pay | Admitting: Internal Medicine

## 2009-05-06 LAB — CONVERTED CEMR LAB
BUN: 16 mg/dL (ref 6–23)
CO2: 28 meq/L (ref 19–32)
Calcium: 9.4 mg/dL (ref 8.4–10.5)
Chloride: 98 meq/L (ref 96–112)
Creatinine, Ser: 0.59 mg/dL (ref 0.40–1.20)
Glucose, Bld: 282 mg/dL — ABNORMAL HIGH (ref 70–99)
Potassium: 4.1 meq/L (ref 3.5–5.3)
Sodium: 138 meq/L (ref 135–145)

## 2009-06-02 ENCOUNTER — Ambulatory Visit: Payer: Self-pay | Admitting: Internal Medicine

## 2009-06-02 ENCOUNTER — Ambulatory Visit (HOSPITAL_COMMUNITY): Admission: RE | Admit: 2009-06-02 | Discharge: 2009-06-02 | Payer: Self-pay | Admitting: Internal Medicine

## 2009-06-02 DIAGNOSIS — J40 Bronchitis, not specified as acute or chronic: Secondary | ICD-10-CM | POA: Insufficient documentation

## 2009-06-02 LAB — CONVERTED CEMR LAB: Blood Glucose, Fingerstick: 416

## 2009-06-03 LAB — CONVERTED CEMR LAB
ALT: 36 units/L — ABNORMAL HIGH (ref 0–35)
AST: 17 units/L (ref 0–37)
Albumin: 3.1 g/dL — ABNORMAL LOW (ref 3.5–5.2)
Alkaline Phosphatase: 255 units/L — ABNORMAL HIGH (ref 39–117)
BUN: 22 mg/dL (ref 6–23)
Basophils Absolute: 0.1 10*3/uL (ref 0.0–0.1)
Basophils Relative: 1 % (ref 0–1)
CO2: 28 meq/L (ref 19–32)
Calcium: 9.3 mg/dL (ref 8.4–10.5)
Chloride: 98 meq/L (ref 96–112)
Creatinine, Ser: 0.95 mg/dL (ref 0.40–1.20)
Eosinophils Absolute: 0 10*3/uL (ref 0.0–0.7)
Eosinophils Relative: 0 % (ref 0–5)
Glucose, Bld: 391 mg/dL — ABNORMAL HIGH (ref 70–99)
HCT: 38.3 % (ref 36.0–46.0)
Hemoglobin: 12 g/dL (ref 12.0–15.0)
Lymphocytes Relative: 24 % (ref 12–46)
Lymphs Abs: 2.3 10*3/uL (ref 0.7–4.0)
MCHC: 31.3 g/dL (ref 30.0–36.0)
MCV: 85.9 fL (ref >=78.0)
Monocytes Absolute: 0.8 10*3/uL (ref 0.1–1.0)
Monocytes Relative: 8 % (ref 3–12)
Neutro Abs: 6.7 10*3/uL (ref 1.7–7.7)
Neutrophils Relative %: 67 % (ref 43–77)
Platelets: 247 10*3/uL (ref 150–400)
Potassium: 3.8 meq/L (ref 3.5–5.3)
RBC: 4.46 M/uL (ref 3.87–5.11)
RDW: 16.4 % — ABNORMAL HIGH (ref 11.5–15.5)
Sodium: 138 meq/L (ref 135–145)
Total Bilirubin: 0.4 mg/dL (ref 0.3–1.2)
Total Protein: 6.6 g/dL (ref 6.0–8.3)
WBC: 9.9 10*3/uL (ref 4.0–10.5)

## 2009-06-04 ENCOUNTER — Telehealth: Payer: Self-pay | Admitting: Licensed Clinical Social Worker

## 2009-06-16 ENCOUNTER — Ambulatory Visit: Payer: Self-pay | Admitting: Internal Medicine

## 2009-06-16 ENCOUNTER — Telehealth: Payer: Self-pay | Admitting: Licensed Clinical Social Worker

## 2009-06-16 DIAGNOSIS — E113299 Type 2 diabetes mellitus with mild nonproliferative diabetic retinopathy without macular edema, unspecified eye: Secondary | ICD-10-CM | POA: Insufficient documentation

## 2009-06-16 LAB — CONVERTED CEMR LAB
BUN: 20 mg/dL (ref 6–23)
Blood Glucose, Fingerstick: 259
CO2: 31 meq/L (ref 19–32)
Calcium: 9.4 mg/dL (ref 8.4–10.5)
Chloride: 100 meq/L (ref 96–112)
Creatinine, Ser: 0.83 mg/dL (ref 0.40–1.20)
Glucose, Bld: 268 mg/dL — ABNORMAL HIGH (ref 70–99)
Potassium: 4.1 meq/L (ref 3.5–5.3)
Sodium: 142 meq/L (ref 135–145)

## 2009-06-17 LAB — HM DIABETES EYE EXAM

## 2009-06-22 ENCOUNTER — Encounter (INDEPENDENT_AMBULATORY_CARE_PROVIDER_SITE_OTHER): Payer: Self-pay | Admitting: Internal Medicine

## 2009-06-23 ENCOUNTER — Ambulatory Visit: Payer: Self-pay | Admitting: Internal Medicine

## 2009-06-23 LAB — CONVERTED CEMR LAB
ALT: 40 units/L — ABNORMAL HIGH (ref 0–35)
AST: 25 units/L (ref 0–37)
Albumin: 3.6 g/dL (ref 3.5–5.2)
Alkaline Phosphatase: 127 units/L — ABNORMAL HIGH (ref 39–117)
BUN: 18 mg/dL (ref 6–23)
Blood Glucose, Fingerstick: 282
CO2: 30 meq/L (ref 19–32)
Calcium: 9.2 mg/dL (ref 8.4–10.5)
Chloride: 101 meq/L (ref 96–112)
Creatinine, Ser: 0.93 mg/dL (ref 0.40–1.20)
Glucose, Bld: 270 mg/dL — ABNORMAL HIGH (ref 70–99)
Potassium: 4.2 meq/L (ref 3.5–5.3)
Sodium: 141 meq/L (ref 135–145)
Total Bilirubin: 0.5 mg/dL (ref 0.3–1.2)
Total Protein: 6.4 g/dL (ref 6.0–8.3)

## 2009-07-12 ENCOUNTER — Other Ambulatory Visit: Payer: Self-pay | Admitting: Dietician

## 2009-07-12 ENCOUNTER — Encounter: Payer: Self-pay | Admitting: Licensed Clinical Social Worker

## 2009-07-12 ENCOUNTER — Ambulatory Visit: Payer: Self-pay | Admitting: Infectious Diseases

## 2009-07-12 LAB — CONVERTED CEMR LAB
ALT: 39 units/L — ABNORMAL HIGH (ref 0–35)
AST: 26 units/L (ref 0–37)
Albumin: 4 g/dL (ref 3.5–5.2)
Alkaline Phosphatase: 175 units/L — ABNORMAL HIGH (ref 39–117)
BUN: 20 mg/dL (ref 6–23)
Blood Glucose, Fingerstick: 263
CO2: 28 meq/L (ref 19–32)
Calcium: 10 mg/dL (ref 8.4–10.5)
Chloride: 95 meq/L — ABNORMAL LOW (ref 96–112)
Creatinine, Ser: 0.92 mg/dL (ref 0.40–1.20)
Glucose, Bld: 322 mg/dL — ABNORMAL HIGH (ref 70–99)
Hgb A1c MFr Bld: 9.8 %
Potassium: 4.3 meq/L (ref 3.5–5.3)
Sodium: 138 meq/L (ref 135–145)
Total Bilirubin: 0.6 mg/dL (ref 0.3–1.2)
Total Protein: 7.6 g/dL (ref 6.0–8.3)

## 2009-07-19 ENCOUNTER — Ambulatory Visit: Payer: Self-pay | Admitting: Internal Medicine

## 2009-07-19 DIAGNOSIS — J329 Chronic sinusitis, unspecified: Secondary | ICD-10-CM | POA: Insufficient documentation

## 2009-07-19 DIAGNOSIS — B370 Candidal stomatitis: Secondary | ICD-10-CM | POA: Insufficient documentation

## 2009-07-30 ENCOUNTER — Telehealth (INDEPENDENT_AMBULATORY_CARE_PROVIDER_SITE_OTHER): Payer: Self-pay | Admitting: Internal Medicine

## 2009-08-12 ENCOUNTER — Encounter (INDEPENDENT_AMBULATORY_CARE_PROVIDER_SITE_OTHER): Payer: Self-pay | Admitting: Internal Medicine

## 2009-08-12 ENCOUNTER — Ambulatory Visit: Payer: Self-pay | Admitting: Internal Medicine

## 2009-08-12 ENCOUNTER — Telehealth: Payer: Self-pay | Admitting: *Deleted

## 2009-08-12 LAB — CONVERTED CEMR LAB: Blood Glucose, Fingerstick: 117

## 2009-08-16 LAB — CONVERTED CEMR LAB
ALT: 23 units/L (ref 0–35)
AST: 26 units/L (ref 0–37)
Albumin: 4 g/dL (ref 3.5–5.2)
Alkaline Phosphatase: 125 units/L — ABNORMAL HIGH (ref 39–117)
BUN: 26 mg/dL — ABNORMAL HIGH (ref 6–23)
CO2: 26 meq/L (ref 19–32)
Calcium: 9.7 mg/dL (ref 8.4–10.5)
Chloride: 100 meq/L (ref 96–112)
Creatinine, Ser: 0.92 mg/dL (ref 0.40–1.20)
Creatinine, Urine: 110.9 mg/dL
Glucose, Bld: 81 mg/dL (ref 70–99)
Microalb Creat Ratio: 25 mg/g (ref 0.0–30.0)
Microalb, Ur: 2.77 mg/dL — ABNORMAL HIGH (ref 0.00–1.89)
Potassium: 4.6 meq/L (ref 3.5–5.3)
Sodium: 139 meq/L (ref 135–145)
Total Bilirubin: 0.5 mg/dL (ref 0.3–1.2)
Total Protein: 7.3 g/dL (ref 6.0–8.3)

## 2009-08-31 ENCOUNTER — Telehealth (INDEPENDENT_AMBULATORY_CARE_PROVIDER_SITE_OTHER): Payer: Self-pay | Admitting: *Deleted

## 2009-09-24 ENCOUNTER — Telehealth (INDEPENDENT_AMBULATORY_CARE_PROVIDER_SITE_OTHER): Payer: Self-pay | Admitting: Internal Medicine

## 2009-09-24 ENCOUNTER — Telehealth (INDEPENDENT_AMBULATORY_CARE_PROVIDER_SITE_OTHER): Payer: Self-pay | Admitting: *Deleted

## 2009-09-29 ENCOUNTER — Ambulatory Visit: Payer: Self-pay | Admitting: Internal Medicine

## 2009-09-29 ENCOUNTER — Encounter: Payer: Self-pay | Admitting: Internal Medicine

## 2009-09-29 ENCOUNTER — Inpatient Hospital Stay (HOSPITAL_COMMUNITY): Admission: AD | Admit: 2009-09-29 | Discharge: 2009-10-01 | Payer: Self-pay | Admitting: Internal Medicine

## 2009-09-29 DIAGNOSIS — R112 Nausea with vomiting, unspecified: Secondary | ICD-10-CM | POA: Insufficient documentation

## 2009-09-29 DIAGNOSIS — N179 Acute kidney failure, unspecified: Secondary | ICD-10-CM | POA: Insufficient documentation

## 2009-09-29 LAB — CONVERTED CEMR LAB
ALT: 25 units/L (ref 0–35)
AST: 40 units/L — ABNORMAL HIGH (ref 0–37)
Albumin: 3.2 g/dL — ABNORMAL LOW (ref 3.5–5.2)
Alkaline Phosphatase: 126 units/L — ABNORMAL HIGH (ref 39–117)
BUN: 17 mg/dL (ref 6–23)
Basophils Absolute: 0 10*3/uL (ref 0.0–0.1)
Basophils Relative: 0 % (ref 0–1)
Blood Glucose, Fingerstick: 167
CO2: 29 meq/L (ref 19–32)
Calcium: 9.7 mg/dL (ref 8.4–10.5)
Chloride: 99 meq/L (ref 96–112)
Creatinine, Ser: 1.97 mg/dL — ABNORMAL HIGH (ref 0.40–1.20)
Crystals: NONE SEEN
Eosinophils Absolute: 0.3 10*3/uL (ref 0.0–0.7)
Eosinophils Relative: 5 % (ref 0–5)
Glucose, Bld: 142 mg/dL — ABNORMAL HIGH (ref 70–99)
HCT: 35.9 % — ABNORMAL LOW (ref 36.0–46.0)
Hemoglobin: 12.2 g/dL (ref 12.0–15.0)
Hgb A1c MFr Bld: 8 %
Ketones, ur: 40 mg/dL — AB
Lipase: 83 units/L — ABNORMAL HIGH (ref 11–59)
Lymphocytes Relative: 36 % (ref 12–46)
Lymphs Abs: 2.2 10*3/uL (ref 0.7–4.0)
MCHC: 34.1 g/dL (ref 30.0–36.0)
MCV: 85.2 fL (ref >=78.0)
Monocytes Absolute: 1 10*3/uL (ref 0.1–1.0)
Monocytes Relative: 17 % — ABNORMAL HIGH (ref 3–12)
Neutro Abs: 2.5 10*3/uL (ref 1.7–7.7)
Neutrophils Relative %: 42 % — ABNORMAL LOW (ref 43–77)
Nitrite: POSITIVE — AB
Platelets: 187 10*3/uL (ref 150–400)
Potassium: 3.9 meq/L (ref 3.5–5.3)
Protein, ur: >300 mg/dL — AB
RBC: 4.21 M/uL (ref 3.87–5.11)
RDW: 15.9 % — ABNORMAL HIGH (ref 11.5–15.5)
Sodium: 137 meq/L (ref 135–145)
Specific Gravity, Urine: 1.021 (ref 1.005–1.0)
Total Bilirubin: 0.9 mg/dL (ref 0.3–1.2)
Total Protein: 7.7 g/dL (ref 6.0–8.3)
Urine Glucose: NEGATIVE mg/dL
Urobilinogen, UA: 1 (ref 0.0–1.0)
WBC: 5.9 10*3/uL (ref 4.0–10.5)
pH: 5 (ref 5.0–8.0)

## 2009-10-01 ENCOUNTER — Encounter: Payer: Self-pay | Admitting: Internal Medicine

## 2009-10-12 ENCOUNTER — Ambulatory Visit: Payer: Self-pay | Admitting: Internal Medicine

## 2009-10-12 DIAGNOSIS — R21 Rash and other nonspecific skin eruption: Secondary | ICD-10-CM | POA: Insufficient documentation

## 2009-10-13 ENCOUNTER — Telehealth (INDEPENDENT_AMBULATORY_CARE_PROVIDER_SITE_OTHER): Payer: Self-pay | Admitting: Internal Medicine

## 2009-11-17 ENCOUNTER — Encounter: Payer: Self-pay | Admitting: Ophthalmology

## 2009-11-17 ENCOUNTER — Ambulatory Visit: Payer: Self-pay | Admitting: Internal Medicine

## 2009-11-18 ENCOUNTER — Encounter: Payer: Self-pay | Admitting: Internal Medicine

## 2009-11-20 ENCOUNTER — Encounter: Payer: Self-pay | Admitting: Internal Medicine

## 2009-12-02 ENCOUNTER — Ambulatory Visit: Payer: Self-pay | Admitting: Internal Medicine

## 2009-12-02 LAB — CONVERTED CEMR LAB: Blood Glucose, Fingerstick: 165

## 2009-12-03 LAB — CONVERTED CEMR LAB
ALT: 26 units/L (ref 0–35)
AST: 21 units/L (ref 0–37)
Albumin: 3.4 g/dL — ABNORMAL LOW (ref 3.5–5.2)
Alkaline Phosphatase: 195 units/L — ABNORMAL HIGH (ref 39–117)
BUN: 16 mg/dL (ref 6–23)
Basophils Absolute: 0 10*3/uL (ref 0.0–0.1)
Basophils Relative: 0 % (ref 0–1)
CO2: 31 meq/L (ref 19–32)
Calcium: 9.3 mg/dL (ref 8.4–10.5)
Chloride: 100 meq/L (ref 96–112)
Creatinine, Ser: 0.69 mg/dL (ref 0.40–1.20)
Eosinophils Absolute: 0 10*3/uL (ref 0.0–0.7)
Eosinophils Relative: 1 % (ref 0–5)
Glucose, Bld: 110 mg/dL — ABNORMAL HIGH (ref 70–99)
HCT: 35.7 % — ABNORMAL LOW (ref 36.0–46.0)
Hemoglobin: 10.5 g/dL — ABNORMAL LOW (ref 12.0–15.0)
Lymphocytes Relative: 19 % (ref 12–46)
Lymphs Abs: 1.4 10*3/uL (ref 0.7–4.0)
MCHC: 29.4 g/dL — ABNORMAL LOW (ref 30.0–36.0)
MCV: 82.4 fL (ref >=78.0)
Monocytes Absolute: 0.5 10*3/uL (ref 0.1–1.0)
Monocytes Relative: 7 % (ref 3–12)
Neutro Abs: 5.3 10*3/uL (ref 1.7–7.7)
Neutrophils Relative %: 73 % (ref 43–77)
Platelets: 230 10*3/uL (ref 150–400)
Potassium: 4.3 meq/L (ref 3.5–5.3)
RBC: 4.33 M/uL (ref 3.87–5.11)
RDW: 16.6 % — ABNORMAL HIGH (ref 11.5–15.5)
Sodium: 142 meq/L (ref 135–145)
Total Bilirubin: 0.7 mg/dL (ref 0.3–1.2)
Total Protein: 7.3 g/dL (ref 6.0–8.3)
WBC: 7.3 10*3/uL (ref 4.0–10.5)

## 2009-12-06 ENCOUNTER — Telehealth: Payer: Self-pay | Admitting: Internal Medicine

## 2009-12-07 ENCOUNTER — Encounter: Payer: Self-pay | Admitting: Internal Medicine

## 2009-12-07 ENCOUNTER — Ambulatory Visit: Payer: Self-pay | Admitting: Internal Medicine

## 2009-12-07 LAB — CONVERTED CEMR LAB: Blood Glucose, Fingerstick: 100

## 2009-12-08 ENCOUNTER — Telehealth: Payer: Self-pay | Admitting: Licensed Clinical Social Worker

## 2009-12-09 LAB — CONVERTED CEMR LAB
BUN: 18 mg/dL (ref 6–23)
CO2: 42 meq/L — ABNORMAL HIGH (ref 19–32)
Calcium: 10.3 mg/dL (ref 8.4–10.5)
Chloride: 89 meq/L — ABNORMAL LOW (ref 96–112)
Creatinine, Ser: 0.67 mg/dL (ref 0.40–1.20)
Glucose, Bld: 80 mg/dL (ref 70–99)
Potassium: 3.7 meq/L (ref 3.5–5.3)
Sodium: 140 meq/L (ref 135–145)

## 2009-12-14 ENCOUNTER — Encounter: Payer: Self-pay | Admitting: Internal Medicine

## 2009-12-14 ENCOUNTER — Ambulatory Visit: Payer: Self-pay | Admitting: Internal Medicine

## 2009-12-14 DIAGNOSIS — E889 Metabolic disorder, unspecified: Secondary | ICD-10-CM | POA: Insufficient documentation

## 2009-12-14 DIAGNOSIS — I498 Other specified cardiac arrhythmias: Secondary | ICD-10-CM | POA: Insufficient documentation

## 2009-12-14 LAB — CONVERTED CEMR LAB: Blood Glucose, Fingerstick: 149

## 2009-12-15 LAB — CONVERTED CEMR LAB
BUN: 19 mg/dL (ref 6–23)
CO2: 35 meq/L — ABNORMAL HIGH (ref 19–32)
Calcium: 9.7 mg/dL (ref 8.4–10.5)
Chloride: 97 meq/L (ref 96–112)
Creatinine, Ser: 0.83 mg/dL (ref 0.40–1.20)
Glucose, Bld: 119 mg/dL — ABNORMAL HIGH (ref 70–99)
Potassium: 3.7 meq/L (ref 3.5–5.3)
Sodium: 141 meq/L (ref 135–145)

## 2009-12-16 ENCOUNTER — Encounter: Payer: Self-pay | Admitting: Internal Medicine

## 2009-12-16 ENCOUNTER — Telehealth: Payer: Self-pay | Admitting: Internal Medicine

## 2009-12-21 ENCOUNTER — Telehealth: Payer: Self-pay | Admitting: Internal Medicine

## 2009-12-30 ENCOUNTER — Telehealth: Payer: Self-pay | Admitting: Internal Medicine

## 2009-12-31 ENCOUNTER — Telehealth: Payer: Self-pay | Admitting: Internal Medicine

## 2010-01-06 ENCOUNTER — Ambulatory Visit: Payer: Self-pay | Admitting: Internal Medicine

## 2010-01-07 ENCOUNTER — Ambulatory Visit: Payer: Self-pay | Admitting: Internal Medicine

## 2010-01-13 ENCOUNTER — Telehealth (INDEPENDENT_AMBULATORY_CARE_PROVIDER_SITE_OTHER): Payer: Self-pay | Admitting: *Deleted

## 2010-01-19 ENCOUNTER — Ambulatory Visit: Payer: Self-pay | Admitting: Internal Medicine

## 2010-01-19 ENCOUNTER — Encounter: Payer: Self-pay | Admitting: Internal Medicine

## 2010-01-19 ENCOUNTER — Telehealth (INDEPENDENT_AMBULATORY_CARE_PROVIDER_SITE_OTHER): Payer: Self-pay

## 2010-01-19 DIAGNOSIS — N189 Chronic kidney disease, unspecified: Secondary | ICD-10-CM | POA: Insufficient documentation

## 2010-01-19 DIAGNOSIS — D631 Anemia in chronic kidney disease: Secondary | ICD-10-CM

## 2010-01-19 LAB — CONVERTED CEMR LAB
Blood Glucose, Fingerstick: 113
Hgb A1c MFr Bld: 5.2 %

## 2010-01-21 ENCOUNTER — Ambulatory Visit: Payer: Self-pay | Admitting: Internal Medicine

## 2010-01-21 LAB — CONVERTED CEMR LAB
BUN: 14 mg/dL (ref 6–23)
CO2: 38 meq/L — ABNORMAL HIGH (ref 19–32)
Calcium: 10.9 mg/dL — ABNORMAL HIGH (ref 8.4–10.5)
Chloride: 95 meq/L — ABNORMAL LOW (ref 96–112)
Cholesterol: 185 mg/dL (ref 0–200)
Creatinine, Ser: 0.72 mg/dL (ref 0.40–1.20)
Glucose, Bld: 94 mg/dL (ref 70–99)
HCT: 38.2 % (ref 36.0–46.0)
HDL: 43 mg/dL (ref >39)
Hemoglobin: 11.4 g/dL — ABNORMAL LOW (ref 12.0–15.0)
LDL Cholesterol: 109 mg/dL — ABNORMAL HIGH (ref 0–99)
MCHC: 29.8 g/dL — ABNORMAL LOW (ref 30.0–36.0)
MCV: 86 fL (ref >=78.0)
Platelets: 216 10*3/uL (ref 150–400)
Potassium: 4.1 meq/L (ref 3.5–5.3)
RBC: 4.44 M/uL (ref 3.87–5.11)
RDW: 17 % — ABNORMAL HIGH (ref 11.5–15.5)
Sodium: 143 meq/L (ref 135–145)
Total CHOL/HDL Ratio: 4.3
Triglycerides: 163 mg/dL — ABNORMAL HIGH (ref <150)
VLDL: 33 mg/dL (ref 0–40)
WBC: 5 10*3/uL (ref 4.0–10.5)

## 2010-02-09 ENCOUNTER — Ambulatory Visit: Payer: Self-pay | Admitting: Internal Medicine

## 2010-04-07 ENCOUNTER — Inpatient Hospital Stay (HOSPITAL_COMMUNITY): Admission: EM | Admit: 2010-04-07 | Discharge: 2009-11-20 | Payer: Self-pay | Admitting: Emergency Medicine

## 2010-04-19 ENCOUNTER — Telehealth: Payer: Self-pay | Admitting: Internal Medicine

## 2010-05-03 ENCOUNTER — Ambulatory Visit: Admission: RE | Admit: 2010-05-03 | Discharge: 2010-05-03 | Payer: Self-pay | Source: Home / Self Care

## 2010-05-03 ENCOUNTER — Encounter: Payer: Self-pay | Admitting: Licensed Clinical Social Worker

## 2010-05-03 LAB — CONVERTED CEMR LAB
Blood Glucose, Fingerstick: 173
Hgb A1c MFr Bld: 6 %

## 2010-05-03 LAB — HM DIABETES FOOT EXAM

## 2010-05-04 ENCOUNTER — Ambulatory Visit
Admission: RE | Admit: 2010-05-04 | Discharge: 2010-05-04 | Payer: Self-pay | Source: Home / Self Care | Attending: Internal Medicine | Admitting: Internal Medicine

## 2010-05-04 ENCOUNTER — Ambulatory Visit (HOSPITAL_COMMUNITY)
Admission: RE | Admit: 2010-05-04 | Discharge: 2010-05-04 | Payer: Self-pay | Source: Home / Self Care | Attending: Internal Medicine | Admitting: Internal Medicine

## 2010-05-04 LAB — HM MAMMOGRAPHY: HM Mammogram: NEGATIVE

## 2010-05-21 ENCOUNTER — Encounter: Payer: Self-pay | Admitting: Internal Medicine

## 2010-05-22 ENCOUNTER — Encounter: Payer: Self-pay | Admitting: Internal Medicine

## 2010-05-30 ENCOUNTER — Telehealth: Payer: Self-pay | Admitting: Internal Medicine

## 2010-05-31 NOTE — Progress Notes (Signed)
Summary: Social Work  Dealer placed by: Soc. Work Call placed to: Patient Summary of Call: Phone says customer is unavailable.   Follow-up for Phone Call        Her number [phone].  Left message at this number.  Katy Fitch  April 13, 2009 9:38 AM  Follow-up by: Junius Finner  MD,  April 12, 2009 4:45 PM  Additional Follow-up for Phone Call Additional follow up Details #1::        Patient will meet with me on Thursday at 10:00 for continued assessment and planning.  Katy Fitch  April 13, 2009 10:03 AM      Appended Document: Social Work Patient was [facility] today for her SW visit.

## 2010-05-31 NOTE — Discharge Summary (Signed)
  Patient discharged on Prednisone 40mg  by mouth daily for Liver Sarcoidosis. She will need GI follow-up and OPC HFU arranged.

## 2010-05-31 NOTE — Assessment & Plan Note (Signed)
Summary: est-ck/fu/meds/cfb   Vital Signs:  Patient Profile:   40 Years Old Female Height:     61 inches (154.94 cm) Weight:      202.0 pounds (91.82 kg) BMI:     38.31 Temp:     97.6 degrees F (36.44 degrees C) oral Pulse rate:   87 / minute BP sitting:   126 / 86  (right arm)  Pt. in pain?   no  Vitals Entered By: Hilda Blades Ditzler RN (May 22, 2008 1:38 PM)              Is Patient Diabetic? No Nutritional Status BMI of > 30 = obese Nutritional Status Detail appetite down with no Prednisone  Have you ever been in a relationship where you felt threatened, hurt or afraid?denies   Does patient need assistance? Functional Status Self care Ambulation Normal     PCP:  Junius Finner  MD  Chief Complaint:  Needs refills on meds and results liver bx..  History of Present Illness: Pt is a 40 yo female w/ past medical history of  Sarcoidosis (Dr Melvyn Novas) QT prolongation Diabetes mellitus, type II Hypertension Heart catheterization 06-06-06 : No CAD, no RAS, normal EF Hx eclampsia Hx of scalp seborrheic dermatitis Abnormal LFT's:   Alk phos 713, TB 1.3, AST 52, ALT 18 in 11/09- Liver u/s and exam c/w [facility].  Hep B                                serology neg but Hep C ab +, HIV neg.  Coags slightly prolonged and alb low.  Started                                empirically on prednisone for GI sarcoid.  AMA and Hep C viral load pending.  here accompanied by her young daughter.  She notes that she is out of all of her medications, including the prednisone. She notes her breathing is worse after stopping the prednisone.  She notes having to use her albuterol inhaler once.  She notes that Dr. Deatra Ina instructed her to taper the prednisone and she is not sure when she is supposed to f/u.  She also notes a cough since stopping the prednisone two weeks ago.  No hemoptysis, fevers, chills or other complaints.  She does note that she is having to use her reading glasses more often and feels like  her vision has been getting worse over the last month.    Updated Prior Medication List: She is out of all of her medications.  Current Allergies (reviewed today): No known allergies   Past Medical History:    Sarcoidosis (Dr Melvyn Novas)    QT prolongation    Diabetes mellitus, type II    Hypertension    Heart catheterization 06-06-06 : No CAD, no RAS, normal EF    Hx eclampsia    Hx of scalp seborrheic dermatitis    Abnormal LFT's:   Alk phos 713, TB 1.3, AST 52, ALT 18 in 11/09- Liver u/s and exam c/w [facility].  Hep B                             serology neg but Hep C ab +, HIV neg.  Coags slightly prolonged and alb low.  Started  empirically on prednisone for GI sarcoid.  AMA and Hep C viral load neg.                    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    Risk Factors: Tobacco use:  current    Year started:  25+ years    Cigarettes:  Yes -- 1/3 pack(s) per day Alcohol use:  no Exercise:  no Seatbelt use:  100 %  PAP Smear History:    Date of Last PAP Smear:  03/11/2008   Review of Systems       As per HPI.   Physical Exam  General:     alert, pleasant, no distress. Eyes:     anicteric, PERRL. Lungs:     normal respiratory effort, mild expiratory wheeze heard on the R, otherwise clear.  Heart:     normal rate, regular rhythm, no murmur, no gallop, and no rub.   Abdomen:     obese, soft, TTP in epigastrium without rebound or guarding. Extremities:     no peripheral edema.  Neurologic:     alert & oriented X3.   Psych:     mood euthymic.    Impression & Recommendations:  Problem # 1:  SARCOIDOSIS (ICD-135) Since she has stopped taking the predisone, her sarcoidosis seems to have flared a bit, particularly her pulmonary symptoms.  Her hx of blurry vision also makes me a bit concerned about uveitis and placed opthamology consult and Jackelyn Poling is working hard to get this arranged b/c she needs a slit lamp  exam.  Also, Dr. Kelby Fam note mentions continuing prednisone at 30 mg but she said it was tapered and stopped.  She is also not sure when she is to f/u w/ him.  I called and left a message for him to call me back to discuss tx of GI sarcoidosis, see if she needs EGD, and when he needs to see her back.  In the interim, b/c her pulmonary symptoms are worse, will restart prednisone at 20 mg and taper to 10 mg to see if she tolerates this b/c this has seemed to help her pulmonary flares in the past.  Plan to see back soon.    Problem # 2:  Preventive Health Care (ICD-V70.0) Pap 11/09 WNL.  Flu shots given.  Problem # 3:  HYPERTENSION (ICD-401.9) BP better and HR normal off metoprolol over 2 weeks, so will see how she does off metoprolol.  The following medications were removed from the medication list:    Metoprolol Tartrate 25 Mg Tabs (Metoprolol tartrate) .Marland Kitchen... Take 1 tablet by mouth two times a day   Complete Medication List: 1)  Albuterol 90 Mcg/act Aers (Albuterol) .... Inhale 2 puffs four times a day as needed 2)  Prednisone 20 Mg Tabs (Prednisone) .... Take one tablet by mouth once a day for one week and then take 1/2 tab by mouth once a day. 3)  Calcium 500/d 500-200 Mg-unit Tabs (Calcium carbonate-vitamin d) .... Take 1 tablet by mouth three times a day 4)  Protonix 40 Mg Tbec (Pantoprazole sodium) .... Take 1 tablet by mouth once a day  Other Orders: Ophthalmology Referral (Ophthalmology)   Patient Instructions: 1)  Please make a followup appointment in 2-4 weeks. 2)  Please see the eye doctor.  3)  Please take prednisone 20 mg once a day and then decrease to 10 mg a day. 4)  Please take the calcium+D and protonix.  Prescriptions: PROTONIX 40 MG TBEC (PANTOPRAZOLE SODIUM) Take 1 tablet by mouth once a day  #30 x 4   Entered and Authorized by:   Junius Finner  MD   Signed by:   Junius Finner  MD on 05/22/2008   Method used:   Print then Give to Patient   RxIDXB:[id number] CALCIUM 500/D 500-200 MG-UNIT TABS (CALCIUM CARBONATE-VITAMIN D) Take 1 tablet by mouth three times a day  #90 x 0   Entered and Authorized by:   Junius Finner  MD   Signed by:   Junius Finner  MD on 05/22/2008   Method used:   Print then Give to Patient   RxID:   [id number] PREDNISONE 20 MG TABS (PREDNISONE) Take one tablet by mouth once a day for one week and then take 1/2 tab by mouth once a day.  #30 x 1   Entered and Authorized by:   Junius Finner  MD   Signed by:   Junius Finner  MD on 05/22/2008   Method used:   Print then Give to Patient   RxID:   [id number]

## 2010-05-31 NOTE — Progress Notes (Signed)
Summary: refill/ hla  Phone Note From Pharmacy   Caller: [facility] Department Summary of Call: lantus is no longer available at [place], novolin n and r are available also novolin 70/30. would any of these be suitable substitutions. Initial call taken by: Freddy Finner RN,  January 25, 2009 1:55 PM  Follow-up for Phone Call        we can switch her to novolin nph.  thanks. Follow-up by: Junius Finner  MD,  January 26, 2009 8:51 AM  Additional Follow-up for Phone Call Additional follow up Details #1::        Phone call completed Additional Follow-up by: Freddy Finner RN,  February 02, 2009 9:12 AM    New/Updated Medications: NOVOLIN N 100 UNIT/ML SUSP (INSULIN ISOPHANE HUMAN) Inject 20 units subcutaneously once a day. Prescriptions: NOVOLIN N 100 UNIT/ML SUSP (INSULIN ISOPHANE HUMAN) Inject 20 units subcutaneously once a day.  #1 vial x 3   Entered and Authorized by:   Junius Finner  MD   Signed by:   Junius Finner  MD on 01/26/2009   Method used:   Telephoned to ...       [facility] Department (retail)       [address] [place], [place]  [address]       Ph: [phone]       Fax: [phone]   RxID:   [id number]

## 2010-05-31 NOTE — Progress Notes (Signed)
Summary: refill/ hla  Phone Note Refill Request Message from:  Fax from Pharmacy on October 13, 2009 3:51 PM  Refills Requested: Medication #1:  TOPROL XL 25 MG XR24H-TAB Take 1 tablet by mouth once a day.   Dosage confirmed as above?Dosage Confirmed   Last Refilled: 4/20 last visit 6/14, last labs 6/1  Initial call taken by: Freddy Finner RN,  October 13, 2009 3:51 PM  Follow-up for Phone Call        Rx called to [facility]. Follow-up by: Morrison Old RN,  October 14, 2009 11:34 AM    Prescriptions: TOPROL XL 25 MG XR24H-TAB (METOPROLOL SUCCINATE) Take 1 tablet by mouth once a day  #30 x 6   Entered by:   Burman Freestone MD   Authorized by:   Junius Finner  MD   Signed by:   Burman Freestone MD on 10/14/2009   Method used:   Telephoned to ...       [facility] Department (retail)       [address] [place], [place]  [address]       Ph: [phone]       Fax: [phone]   RxID:   [id number]

## 2010-05-31 NOTE — Progress Notes (Signed)
Summary: phone/gg  Phone Note Call from Patient   Caller: Patient Summary of Call: Pt called today stating she has taken meds today and shortly after felt weak, she ate something went to health dept.`She is not sure what is going on.  She also saw big bright circle in front of her eye.  It goes away after she eats.  Yesterday  CBG was 181.  She is checking now and CBG is 90.  I transfered call to Barnabas Harries for advise. Initial call taken by: Gevena Cotton RN,  December 30, 2009 11:51 AM  Follow-up for Phone Call        Patient reports at dinner last night her CBG was  181-she felt shakey, weak, laid down and felt better. again this morning: took  35 units 70/30 insulin and ate at 8 am (did not test blood sugar),  felt shakey, weak, had sandwich and coffee, still felt bad in 15 minutes- ate another sandwich- went to health depart, by the time she came home and was feeling a bit better cbg 90 ~ 11-12 noon. Thinks she must have been much lower earlier. took Pm insulin at 11 PM yesterday and had eaten dinner "way before".   Discussed proper timing of insulin (feel patient had PM and AM insulin both on board this am causing a low), prevention and treatment of hypoglycemia. patient urged to check blood sugar 4 x today and tomorrow,  always carry meter & treatment for hypoglycemia with her at all times.  Patient told to call us if additional low blood sugars as insulin may need to be adjusted.  Note (and also per patient report) she was to increas her PM dose of inulin to 40 units- medication record updated with new dose and to read more specifically.  will send to PCP. Follow-up by: Barnabas Harries RD,CDE,  December 30, 2009 12:58 PM    New/Updated Medications: NOVOLIN 70/30 70-30 % SUSP (INSULIN ISOPHANE & REGULAR) Inject 35 units 30 minutes before breakfast and 40 units 30 minutes before evening meal.   Agree with Donna's assessment. Would have her check sugars 4x per day for at least two  weeks and bring back patient to clinic for evaluation. Thank you.

## 2010-05-31 NOTE — Letter (Signed)
Summary: Chiropodist   Imported By: Bonner Puna 03/12/2009 15:13:21  _____________________________________________________________________  External Attachment:    Type:   Image     Comment:   External Document

## 2010-05-31 NOTE — Assessment & Plan Note (Signed)
Summary: DM TEACHING/CH   Allergies: No Known Drug Allergies   Complete Medication List: 1)  Prednisone 10 Mg Tabs (Prednisone) .... Take 3 tabs by mouth daily for now 2)  Metformin Hcl 500 Mg Tabs (Metformin hcl) .... Take 1 tablet by mouth two times a day 3)  Albuterol 90 Mcg/act Aers (Albuterol) .... Inhale 2 puffs four times a day as needed 4)  Calcium 500/d 500-200 Mg-unit Tabs (Calcium carbonate-vitamin d) .... Take 1 tablet by mouth three times a day 5)  Omeprazole 20 Mg Cpdr (Omeprazole) .... Take 1 tablet by mouth two times a day 6)  Novolin N 100 Unit/ml Susp (Insulin isophane human) .... Inject 20 units subcutaneously once a day. 7)  Insulin Syringe/needle 28g X 1/2" 0.5 Ml Misc (Insulin syringe-needle u-100) .... Per instruction 8)  Freestyle Lite Test Strp (Glucose blood) .... Use to check blood sugar 3x daily 9)  Lancets Misc (Lancets) .... 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 Seems very motivated for eat healthy for her self and her children  Potential Barriers  Economic/Supplies  Needs review/assistance  Diabetes Medications:  Comments: Freestyle lite- has been paying cash- suggested she finish paperwork at [facility] and apply for patient assistacen through [facility]. CBG this morning was 468 before breakfast. On chronic steroids for sarcoidosis.   Long Acting  Insulin Type:Lantus  Bedtime Dose: 20 units    Monitoring Self monitoring blood glucose 2 times a day Name of Meter  Freestyle Lite  Time of Testing  Before Breakfast  Recent Episodes of: Requiring  Help from another person  Hyperglycemia : Yes Hypoglycemia: No Severe Hypoglycemia : No     Estimated /Usual Carb Intake Breakfast # of Carbs/Grams pinnapple, cheese and coffeee Midmorning # of Carbs/Grams onion, pepper and cheese omelet, hash browns, keilbasa and unsweet tea  Nutrition assessment What beverages do you drink?  Like regualr soda- now limits herself to 8 oz /day Do you read food labels?                                                                          Yes What do you look at?                                                                                                                 reads labels for sugar, had misconception that as  long as she cut down on sugar she could lower her blood sugar Biggest challenge to eating healthy: Eating too much- especially fats  Activity Limitations  Inadequate physical activity Diabetes Disease Process  Discussed today Define diabetes in simple terms: Needs review/assistanceState own type of diabetes: Demonstrates competencyState diabetes is treated by meal plan-exercise-medication-monitoring-education: Demonstrates competency Medications State name-action-dose-duration-side effects-and time to take medication: Needs review/assistanceState appropriate timing of food related to medication: Not applicable Nutritional Management Identify what foods most often affect blood glucose: Needs review/assistance    Verbalize importance of controlling food portions: Needs review/assistance   State importance of spacing and not omitting meals and snacks: Needs review/assistance   State changes planned for home meals/snacks: Needs review/assistance    Monitoring State purpose and frequency of monitoring BG-ketones-HgbA1C  : Needs review/assistance   State target blood glucose and HgbA1C goals: Needs review/assistance    Complications State the causes-signs and symptoms and prevention of Hyperglycemia: Needs review/assistance     Explain proper treatment of hyperglycemia: Needs review/assistance    Exercise States importance of exercise: Needs review/assistance   States effect of exercise on blood glucose: Needs review/assistance   Verbalizes safety measures for exercise related to diabetes: Needs review/assistance Lifestyle changes:Goal setting and Problem solving State benefits of making appropriate lifestyle changes: Needs review/assistanceIdentify lifestyle behaviors that need to change: Needs review/assistance   Identify risk factors that interfere with health: Needs review/assistance   Develop strategies to reduce risk factors: Needs review/assistance   Identify Family/SO role in managing diabetes: Demonstrates competency Psychosocial Adjustment State three common feelings that might be experienced when learning to cope with diabetes: Needs review/assistanceIdentify two methods to cope with these feelings: Needs review/assistanceIdentify how stress affects diabetes & two sources of stress: Needs review/assistanceName two ways of obtaining support from family/friends: Needs review/assistanceDiabetes Management Education Done: 01/28/2009    BEHAVIORAL GOALS INITIAL Incorporating appropriate nutritional management: limit carbs to 30 -60 grams at meals, also try to eat less fatty foods Monitoring blood glucose levels daily: bring meter to next visit        Diabetes Self Management Support: sister, clinic staff Follow-up: 2 weeks

## 2010-05-31 NOTE — Assessment & Plan Note (Signed)
Summary: 17MONTH F/U/EST/VS   Vital Signs:  Patient profile:   40 year old female Height:      61 inches (154.94 cm) Weight:      214.7 pounds (97.59 kg) BMI:     40.71 O2 Sat:      97 % on Room air Temp:     97.7 degrees F oral Pulse rate:   116 / minute BP sitting:   123 / 82  (right arm)  Vitals Entered By: Morrison Old RN (October 05, 2008 2:06 PM)  O2 Flow:  Room air CC: F/U visit; pt. has been out Prednisone about 1  weeks d/t [facility] card had expired. Also has rash on feet/arms/chest/neck. Is Patient Diabetic? No Pain Assessment Patient in pain? no      Nutritional Status BMI of > 30 = obese  Have you ever been in a relationship where you felt threatened, hurt or afraid?No   Does patient need assistance? Functional Status Self care Ambulation Normal   Primary Care Provider:  Junius Finner  MD  CC:  F/U visit; pt. has been out Prednisone about 1  weeks d/t [facility] card had expired. Also has rash on feet/arms/chest/neck..  History of Present Illness: Pt is a 40 yo female w/ past medical history of  Sarcoidosis (Dr Deatra Ina) QT prolongation Diabetes mellitus, type II Hypertension Heart catheterization 06-06-06 : No CAD, no RAS, normal EF Hx eclampsia Hx of scalp seborrheic dermatitis Abnormal LFT's:   Alk phos 713, TB 1.3, AST 52, ALT 18 in 11/09- Liver u/s and exam c/w HSM.  Hep B                             serology neg but Hep C ab +, HIV neg.  Coags slightly prolonged and alb low.  Started                                           empirically on prednisone for GI sarcoid.  AMA and Hep C viral load neg.                 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  here for routine f/u of sarcoidosis.  She notes that she has been completely out of prednisone and PPI for one week b/c she did not have the money but she got recertified w/ [facility] and so she plans on picking it up today.  She notes that over the last two days she started getting a  rash in the inside of her foot and chest and arms.  It is pururitic in nature.  She notes that since she stopped the prednisone she is having significant dyspnea on exertion.  She notes that she saw a doctor last month at [facility] and she rescheduled her appt w/ Dr. Melvyn Novas for tomorrow.     Preventive Screening-Counseling & Management  Alcohol-Tobacco     Alcohol drinks/day: 0     Smoking Status: current     Smoking Cessation Counseling: yes     Packs/Day: 1/3     Year Started: 25+ years  Caffeine-Diet-Exercise     Does Patient Exercise: no  Current Medications (verified): 1)  Albuterol 90 Mcg/act Aers (Albuterol) .... Inhale 2 Puffs Four Times A Day As Needed 2)  Prednisone 10 Mg  Tabs (Prednisone) .... Take 4 Tabs By Mouth Daily or As Directed 3)  Calcium 500/d 500-200 Mg-Unit Tabs (Calcium Carbonate-Vitamin D) .... Take 1 Tablet By Mouth Three Times A Day 4)  Omeprazole 20 Mg Cpdr (Omeprazole) .... Take 1 Tablet By Mouth Two Times A Day 5)  Flonase 50 Mcg/act Susp (Fluticasone Propionate) .... 2 Sprays in Each Nostril Daily 6)  Metformin Hcl 500 Mg Tabs (Metformin Hcl) .... Take 1 Tablet By Mouth Once A Day  Allergies (verified): No Known Drug Allergies  Past History:  Past Medical History: Last updated: 05/22/2008 Sarcoidosis (Dr Melvyn Novas) QT prolongation Diabetes mellitus, type II Hypertension Heart catheterization 06-06-06 : No CAD, no RAS, normal EF Hx eclampsia Hx of scalp seborrheic dermatitis Abnormal LFT's:   Alk phos 713, TB 1.3, AST 52, ALT 18 in 11/09- Liver u/s and exam c/w HSM.  Hep B                             serology neg but Hep C ab +, HIV neg.  Coags slightly prolonged and alb low.  Started                                           empirically on prednisone for GI sarcoid.  AMA and Hep C viral load neg.                 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  Past Surgical History: Last updated: 08-04-202008 Tubal ligation  Social  History: Last updated: 03/11/2008 She is single, two children that are healthy.  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  She denies illegal drugs and etoh use.    Risk Factors: Smoking Status: current (10/05/2008) Packs/Day: 1/3 (10/05/2008)  Social History: Reviewed history from 03/11/2008 and no changes required. She is single, two children that are healthy.  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  She denies illegal drugs and etoh use.    Review of Systems       As per HPI.  Physical Exam  General:  alert, oriented x3, obese, no distress.   Eyes:  anicteric, PERRL, EOMI.  Chest Wall:    Lungs:  Crackles at R base, otherwise clear, no wheezing, slightly after walking from lobby.  Heart:  tachy, regular rhythm, no m/r/g. Abdomen:  obese, soft, NT and ND.  Extremities:  trace pitting lower extremity edema.  Skin:  dark discoloration of the medial side of both feet.  fine papules on chest about 4x3 in in size. Psych:  mood euthymic.   Impression & Recommendations:  Problem # 1:  SARCOIDOSIS ([id number]) Will likely need prolonged course of steroids.  Will get records from GI doctor at [facility] and plan on following LFT's regularly to monitor progression of hepatic sarcoiosis.  Also, f/u w/ Dr. Melvyn Novas in pulm soon.  May need PFT's to monitor progression of sarcoid.  Discussed side effects of prednisone and stopping prednisone abruptly and she will try not to do this again.  Cont Ca + Vit D and PPI.    Problem # 2:  TOBACCO USE (ICD-305.1) Can't afford chantix or other supplements to help w/ cessation.  Provided counceling and the number to the quit hotline.    Problem # 3:  DIABETES  MELLITUS, STEROID-INDUCED (ICD-251.8) A1c checked and she meets criteria for diabetes.  Her a1c was checked 3 months ago and < 6.5.  Started metformin and she has meter and strips at home.  Discussed possible side effects of hypoglycemia.  Also, referral made to our diabetes  educator.  She has an appt w/ optho in two months for f/u of sarcoid.  BP at goal for diabetic.  At her next visit, will check into last lipids and urine microalbumin/creatinine ratio and will perform foot exam at that time.  Orders: Diabetic Clinic Referral (Diabetic)  Problem # 4:  OBESITY (ICD-278.00) Prednisone contributing.  Unable to exercise b/c of DOE.  Discussed weight loss strategies and will try to eat out less for now.   Problem # 5:  TACHYCARDIA (ICD-785.0) TSH checked previously and WNL.  Likely secondary to sarcoidosis.  ECHO done w/ nl EF and only mild pulm htn, not enough to account for tachycardia.  When she was on steroids consistently, tachycardia resolved, so for now, will plan on aggressively tx'g sarcoid.    Problem # 6:  Preventive Health Care (ICD-V70.0) Repeat pap due 11/10.  Complete Medication List: 1)  Albuterol 90 Mcg/act Aers (Albuterol) .... Inhale 2 puffs four times a day as needed 2)  Prednisone 10 Mg Tabs (Prednisone) .... Take 4 tabs by mouth daily or as directed 3)  Calcium 500/d 500-200 Mg-unit Tabs (Calcium carbonate-vitamin d) .... Take 1 tablet by mouth three times a day 4)  Omeprazole 20 Mg Cpdr (Omeprazole) .... Take 1 tablet by mouth two times a day 5)  Flonase 50 Mcg/act Susp (Fluticasone propionate) .... 2 sprays in each nostril daily 6)  Metformin Hcl 500 Mg Tabs (Metformin hcl) .... Take 1 tablet by mouth once a day  Other Orders: T-Hgb A1C (in-house) [id number])  Patient Instructions: 1)  Please make a followup appointment in one month. 2)  Please remember the side effects of the medicines we talked about. 3)  Remember to never stop taking the prednisone. 4)  Please start you new blood sugar pill. 5)  Please see Barnabas Harries our diabetes educator.  Prescriptions: METFORMIN HCL 500 MG TABS (METFORMIN HCL) Take 1 tablet by mouth once a day  #30 x 1   Entered and Authorized by:   Junius Finner  MD   Signed by:   Junius Finner  MD on  10/05/2008   Method used:   Print then Give to Patient   RxID:   [id number]   Laboratory Results   Blood Tests   Date/Time Received: October 05, 2008 2:54 PM. Date/Time Reported: Maryan Rued  October 05, 2008 2:54 PM   HGBA1C: 7.6%   (Normal Range: Non-Diabetic - 3-6%   Control Diabetic - 6-8%)

## 2010-05-31 NOTE — Letter (Signed)
Summary: Disability / [employer]  Disability / [employer]   Imported By: Bonner Puna 09/01/2008 16:03:31  _____________________________________________________________________  External Attachment:    Type:   Image     Comment:   External Document

## 2010-05-31 NOTE — Assessment & Plan Note (Signed)
Summary: 2WK FU/OK PER DR Devonn Giampietro/VS   Vital Signs:  Patient Profile:   40 Years Old Female Height:     61 inches (154.94 cm) Weight:      187.02 pounds (85.01 kg) BMI:     35.46 Temp:     97.7 degrees F (36.50 degrees C) oral Pulse rate:   92 / minute BP sitting:   128 / 82  (right arm)  Pt. in pain?   no  Vitals Entered By: Sander Nephew RN (April 08, 2008 9:32 AM)              Is Patient Diabetic? No Nutritional Status BMI of > 30 = obese  Have you ever been in a relationship where you felt threatened, hurt or afraid?No   Does patient need assistance? Functional Status Self care Ambulation Normal     PCP:  Junius Finner  MD  Chief Complaint:  2 week check up .  History of Present Illness: Pt is a 40 yo female w/ past medical history of  Sarcoidosis (Dr Melvyn Novas) QT prolongation Diabetes mellitus, type II Hypertension Heart catheterization 06-06-06 : No CAD, no RAS, normal EF Hx eclampsia Hx of scalp seborrheic dermatitis  here for f/u of her abnormal LFT's.  She has started the metoprolol and prednisone and is doing well without noticing any side effects.  She notes that the small streaking of blood in her sputum she was having has completely resolved w/ the prednisone.  She has an appt w/ the gastroenterologist on 12/23 w/ the assistance of [facility].  She notes her lower extremity edema has improved, her cough has improved.      Prior Medications Reviewed Using: Medication Bottles  Prior Medication List:  ALBUTEROL 90 MCG/ACT AERS (ALBUTEROL) Inhale 2 puffs four times a day as needed PREDNISONE 20 MG TABS (PREDNISONE) Take two tablets by mouth once daily. METOPROLOL TARTRATE 25 MG TABS (METOPROLOL TARTRATE) Take 1 tablet by mouth two times a day CALCIUM 500/D 500-200 MG-UNIT TABS (CALCIUM CARBONATE-VITAMIN D) Take 1 tablet by mouth three times a day   Current Allergies (reviewed today): No known allergies   Past Medical History:    Reviewed history  from 2020-05-806 and no changes required:       Sarcoidosis (Dr Melvyn Novas)       QT prolongation       Diabetes mellitus, type II       Hypertension       Heart catheterization 06-06-06 : No CAD, no RAS, normal EF       Hx eclampsia       Hx of scalp seborrheic dermatitis       Abnormal LFT's:   Alk phos 713, TB 1.3, AST 52, ALT 18 in 11/09- Liver u/s and exam c/w HSM.  Hep B                             serology neg but Hep C ab +, HIV neg.  Coags slightly prolonged and alb low.  Started                             empirically on prednisone for GI sarcoid.  AMA and Hep C viral load pending.  Past Surgical History:    Reviewed history from 2020-05-806 and no changes required:       Tubal ligation   Family History:  Reviewed history from 03/11/2008 and no changes required:       Children-healthy       Mom-Colon ca       Father-MS  Social History:    Reviewed history from 03/11/2008 and no changes required:       She is single, two children that are healthy.  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  She denies illegal drugs and etoh use.     Risk Factors:  Tobacco use:  current    Year started:  25+ years    Cigarettes:  Yes -- 1/3 pack(s) per day    Counseled to quit/cut down tobacco use:  yes Alcohol use:  no Exercise:  no Seatbelt use:  100 %  PAP Smear History:    Date of Last PAP Smear:  03/11/2008   Review of Systems       As per HPI.   Physical Exam  General:     Alert, pleasant, no distress.  Eyes:     Anicteric. Lungs:     Normal respiratory effort, chest expands symmetrically. Lungs are clear to auscultation, no crackles or wheezes. Heart:     Normal rate and regular rhythm. S1 and S2 normal without gallop, murmur, click, rub or other extra sounds. Abdomen:     +BS's, slightly distended, HSM noted, but nontender today, soft.  Extremities:     No signficant edema-improved.   Psych:     Mood euthymic.    Impression &  Recommendations:  Problem # 1:  HEPATOMEGALY (ICD-789.1) HSM noted on routine exam and checked LFT's and u/s.  LFT's abnormal initially as above and checked coags at f/u.  Some synthetic dysfunction noted given her slightly prolonged PT/PTT and low albumin.  HSM noted on abdominal u/s.   Feel like this is most likely related to GI involvement of sarcoid and started empiric tx w/ prednisone 40 mg at her last visit and she is tolerating that well.  However, also got hepatitis serology at that time and her Hep C ab came back positive.  Will proceed w/ vial load, but think her elevated alk phos is from sarcoid.  Will also check AMA b/c could be PBC. HIV is neg.  Feel like she needs a liver biopsy and EGD(given HSM) and referred her to GI and she has an appt on 12/23.  Will f/u LFT's today after starting prednisone, check viral load and AMA.  F/u two weeks after GI appt.  Ca/Vit D and PPI added b/c will likely need steroids for a while.   Orders: T- * Misc. Laboratory test [phone]) T-Comprehensive Metabolic Panel ([id number])   Problem # 2:  HEMOPTYSIS (ICD-786.3) Likely secondary to sarcoidosis since this completely resolved w/ tx of prednisone.  Will check CXR and look to see if her sarcoidosis appears stable.  May need PPD at some point, esp if anything is suspicious on her CXR.  Will also consider CT scan of her chest at her next visit.  Will wait until she sees GI b/c may benefit from CT of her chest and abd to better evaluate her liver/spleen and look for possible esophageal varices.    Problem # 3:  HYPERTENSION (ICD-401.9) BP improved w/ the addition of metoprolol.  HR also better.  Cont current tx plan.  Her updated medication list for this problem includes:    Metoprolol Tartrate 25 Mg Tabs (Metoprolol tartrate) .Marland Kitchen... Take 1 tablet by mouth two times a day  Problem # 4:  Preventive Health Care (ICD-V70.0) Pap at her intial visit w/ me was normal.  F/u in one year.  Flu shot  given.  Problem # 5:  ? of CHRONIC HEPATITIS C WITHOUT MENTION HEPATIC COMA (ICD-070.54) Hep C ab positive, checking viral load.  Orders: T-Hepatitis C Viral Load ([phone]) T- * Misc. Laboratory test [phone])   Complete Medication List: 1)  Albuterol 90 Mcg/act Aers (Albuterol) .... Inhale 2 puffs four times a day as needed 2)  Prednisone 20 Mg Tabs (Prednisone) .... Take two tablets by mouth once daily. 3)  Metoprolol Tartrate 25 Mg Tabs (Metoprolol tartrate) .... Take 1 tablet by mouth two times a day 4)  Calcium 500/d 500-200 Mg-unit Tabs (Calcium carbonate-vitamin d) .... Take 1 tablet by mouth three times a day 5)  Protonix 40 Mg Tbec (Pantoprazole sodium) .... Take 1 tablet by mouth once a day  Other Orders: CXR- 2view (CXR) H1N1 vaccine [id number]) Influenza A (H1N1) w/ Phy couseling [id number]) Admin 1st Vaccine [id number]) Flu Vaccine 105yrs + [id number])   Patient Instructions: 1)  Please make a followup appointment with me the first week of January.  It is ok to overbook me for this. 2)  Call sooner if you need anything.  3)  Please see the gastroenterologist. 4)  Please get your bloodwork done. 5)  Please get your new medication filled. 6)  Great job taking your medications!!!  Flu Vaccine Consent Questions     Do you have a history of severe allergic reactions to this vaccine? no    Any prior history of allergic reactions to egg and/or gelatin? no    Do you have a sensitivity to the preservative Thimersol? no    Do you have a past history of Guillan-Barre Syndrome? no    Do you currently have an acute febrile illness? no    Have you ever had a severe reaction to latex? no    Vaccine information given and explained to patient? yes    Are you currently pregnant? no   Do you have Asthma? no   Lot Number: [id number]   Exp Date: August 25, 2009   Site Given  Left Deltoid Sander Nephew RN  April 08, 2008 11:11 AM  Flu Vaccine Consent Questions     Do you have a history of  severe allergic reactions to this vaccine? no    Any prior history of allergic reactions to egg and/or gelatin? no    Do you have a sensitivity to the preservative Thimersol? no    Do you have a past history of Guillan-Barre Syndrome? no    Do you currently have an acute febrile illness? no    Have you ever had a severe reaction to latex? no    Vaccine information given and explained to patient? yes    Are you currently pregnant? no    Lot Number: [id number]   Exp Date: 10/01/2008   Site Given  Left Deltoid IM Sander Nephew RN  April 08, 2008 11:12 AM     Prescriptions: PROTONIX 40 MG TBEC (PANTOPRAZOLE SODIUM) Take 1 tablet by mouth once a day  #30 x 4   Entered and Authorized by:   Junius Finner  MD   Signed by:   Junius Finner  MD on 04/08/2008   Method used:   Print then Give to Patient   [id number]  ]

## 2010-05-31 NOTE — Progress Notes (Signed)
Summary: cardizem or diltiazem  ok  Phone Note From Pharmacy Call back at [place]: [place] Call For: wert  Summary of Call: Pt was on Procardia with pt assistance, she has no insurance and would have to pay for Cardizem LA 240 xr tab (generic say deltiazem generic beads)  They have the generic coaded Bead but she will have to opay for this.  Please advise. Initial call taken by: Zigmund Gottron,  January 13, 2010 12:27 PM  Follow-up for Phone Call        Spoke with Diane.  She states that she needs okay for pt to have the diltiazem 240 mg coated beads rather than the cardizem LA 240 tabs.  This is so that she can afford med.  Pls advise if okay thanks! Follow-up by: Tilden Dome,  January 13, 2010 1:56 PM  Additional Follow-up for Phone Call Additional follow up Details #1::        that's fine Additional Follow-up by: Tanda Rockers MD,  January 13, 2010 2:06 PM    Additional Follow-up for Phone Call Additional follow up Details #2::    Called, spoke with Diane.  Informed her ok per MW for pt to have diltiazem 240 mg coated beads in place of cardizem LA 240mg  tabs.  She verbalized understanding of this.  Follow-up by: Raymondo Band RN,  January 13, 2010 2:17 PM  New/Updated Medications: DILTIAZEM HCL COATED BEADS 240 MG [id number] (DILTIAZEM HCL COATED BEADS) Take 1 capsule by mouth once a day

## 2010-05-31 NOTE — Progress Notes (Signed)
Summary: nos appt  Phone Note Call from Patient   Caller: [email or web address]  Call For: wert Summary of Call: Rsc nos from 8/22 to 9/8 @ 9:45a. Initial call taken by: Netta Neat,  December 21, 2009 11:03 AM

## 2010-05-31 NOTE — Progress Notes (Signed)
----   Converted from flag ---- ---- 08/17/2009 4:03 PM, Junius Finner  MD wrote: that is fine.  her CBG's seem to be much better w/ the prednisone decrease and hopefully will continue to improve.   ---- 08/17/2009 2:06 PM, Barnabas Harries RD,CDE wrote: patient seemed very motivted to decrease weight- may I teach her to self adjust insulin appropriate dose by  1 unit if CBG 70 > 90 x2 occassions at same time reading and  2 units for CBG < 70 on 1 occassion ?  Butch Penny ------------------------------

## 2010-05-31 NOTE — Progress Notes (Signed)
  Phone Note Outgoing Call   Call placed by: Soc. Work Call placed to: Patient Summary of Call: Called Ms. Tolin and no answer at phone number.

## 2010-05-31 NOTE — Miscellaneous (Signed)
Summary: liver biopsy  Clinical Lists Changes  Orders: Added new Referral order of CT/ULS Guided Liver Biospy (CT/ULS Guided Liv BX) - Signed

## 2010-05-31 NOTE — Assessment & Plan Note (Signed)
Summary: 2WK F/U/EST/VS   Vital Signs:  Patient profile:   40 year old female Height:      61 inches (154.94 cm) Weight:      233.04 pounds (105.93 kg) BMI:     44.19 Temp:     98.4 degrees F (36.89 degrees C) oral Pulse rate:   114 / minute BP sitting:   139 / 80  (right arm)  Vitals Entered By: Sander Nephew RN (June 16, 2009 3:58 PM) Is Patient Diabetic? Yes Did you bring your meter with you today? No Pain Assessment Patient in pain? no      Nutritional Status BMI of > 30 = obese CBG Result 259  Have you ever been in a relationship where you felt threatened, hurt or afraid?No   Does patient need assistance? Functional Status Self care Ambulation Normal Comments Recheck \\\\\\\\\\\\\\\\\\\\\\\\\\\\\\\\\\\\\\\\\\\\\\\\\\\\\\\\\\\\\\\\\\\\\\\\\\\\\\\\  Primary Care Provider:  Junius Finner  MD   History of Present Illness: Pt is a 40 yo female w/ past med hx below here for routine f/u of her chronic conditions, including sarcoidosis w/ hepatic involvement, CHF, and nephrotic syndrome.  She notes recent URI symptoms have resolved and breathing status is stable.  She notes worsening swelling, particularly in her lower extremities, and weight gain.   Depression History:      The patient denies a depressed mood most of the day and a diminished interest in her usual daily activities.         Preventive Screening-Counseling & Management  Alcohol-Tobacco     Alcohol drinks/day: 0     Smoking Status: current     Smoking Cessation Counseling: yes     Packs/Day: 1/3     Year Started: 25+ years  Current Medications (verified): 1)  Prednisone 10 Mg Tabs (Prednisone) .... Take 4 Tabs By Mouth Once Daily 2)  Metformin Hcl 1000 Mg Tabs (Metformin Hcl) .... Take 1 Tablet By Mouth Two Times A Day 3)  Albuterol 90 Mcg/act Aers (Albuterol) .... Inhale 2 Puffs Four Times A Day As Needed 4)  Calcium 500/d 500-200 Mg-Unit Tabs (Calcium Carbonate-Vitamin D) .... Take 1 Tablet By  Mouth Three Times A Day 5)  Omeprazole 20 Mg Cpdr (Omeprazole) .... Take 1 Tablet By Mouth Two Times A Day 6)  Novolin 70/30 70-30 % Susp (Insulin Isophane & Regular) .... Inject 35 Units Subcutaneously Two Times A Day 7)  Insulin Syringe/needle 28g X 1/2" 0.5 Ml Misc (Insulin Syringe-Needle U-100) .... Per Instruction 8)  Freestyle Lite Test  Strp (Glucose Blood) .... Use To Check Blood Sugar 3x Daily 9)  Lancets  Misc (Lancets) .... Use To Check Blood Sugar Three Times Daily 10)  Zyrtec Allergy 10 Mg Caps (Cetirizine Hcl) .... Take 1 Tablet By Mouth Once A Day 11)  Nystatin  Powd (Nystatin) .... Apply To Affected Area 2-3 Times Per Day 12)  Chantix 1 Mg Tabs (Varenicline Tartrate) .... Take 1 Tablet By Mouth Once A Day 13)  Accupril 20 Mg Tabs (Quinapril Hcl) .... Two Times A Day 14)  Furosemide 40 Mg Tabs (Furosemide) .... Take 1 Tablet By Mouth Once A Day 15)  K-Lor 20 Meq Pack (Potassium Chloride) .... Take 1 Tablet By Mouth Once A Day 16)  Toprol Xl 25 Mg Xr24h-Tab (Metoprolol Succinate) .... Take 1 Tablet By Mouth Once A Day 17)  Glipizide Xl 5 Mg Xr24h-Tab (Glipizide) .... Take 1 Tablet By Mouth Once A Day  Allergies (verified): No Known Drug Allergies  Past History:  Past Medical  History: Last updated: 06/02/2009 Sarcoidosis (Dr Melvyn Novas) QT prolongation Diabetes mellitus, type II Hypertension Heart catheterization 06-06-06 : No CAD, no RAS, normal EF Hx eclampsia Hx of scalp seborrheic dermatitis Abnormal LFT's:   Alk phos 713, TB 1.3, AST 52, ALT 18 in 11/09- Liver u/s and exam c/w HSM.  Hep B                             serology neg but Hep C ab +, HIV neg.  Coags slightly prolonged and alb low.  Started                                           empirically on prednisone for GI sarcoid.  AMA and Hep C viral load neg.                 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 Nonischemic cardiomyopathy-EF 45% 12/10 Nephrotic syndrome secondary to diabetes (neg  complement, ANA, etc 12/10)  Past Surgical History: Last updated: Mar 11, 202008 Tubal ligation  Social History: Last updated: 04/06/2009 She is single, two children that are healthy.  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  Smokes 1/2 ppd x 20 years.  She denies illegal drugs and alcohol use.    Risk Factors: Smoking Status: current (06/16/2009) Packs/Day: 1/3 (06/16/2009)  Social History: Reviewed history from 04/06/2009 and no changes required. She is single, two children that are healthy.  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  Smokes 1/2 ppd x 20 years.  She denies illegal drugs and alcohol use.    Review of Systems       As per HPI.   Physical Exam  General:  alert, pleasant, obese, no distress. Eyes:  PERRL, EOMI Mouth:  MMM, no thrush. Lungs:  bibasilar crackles, clear w/ coughing, decresed air mvt globally, nl effort. Heart:  Tachy, reg, no m/r/g. Abdomen:  obese, soft, NT and ND. Extremities:  2+ pitting bilateral lower extremity edema.   Impression & Recommendations:  Problem # 1:  EDEMA (Z5899001.3) Related to nephrotic syndrome and CHF.  She appears volume overloaded on exam.  Will increase lasix and f/u in 1 week for recheck and BMET.  Her updated medication list for this problem includes:    Furosemide 40 Mg Tabs (Furosemide) .Marland Kitchen... Take 1 tablet by mouth twice a day  Problem # 2:  HYPERTENSION (ICD-401.9) Improved, increasing lasix today and will improve once more euvolemic.  Her updated medication list for this problem includes:    Accupril 20 Mg Tabs (Quinapril hcl) .Marland Kitchen... Take two pills once daily.    Furosemide 40 Mg Tabs (Furosemide) .Marland Kitchen... Take 1 tablet by mouth twice a day    Toprol Xl 25 Mg Xr24h-tab (Metoprolol succinate) .Marland Kitchen... Take 1 tablet by mouth once a day  Problem # 3:  SARCOIDOSIS (ICD-135) Will beging steroid taper.  She has been on this a long time, so will taper slowly.  Will decrease from 40 mg to 35 mg a day.  Problem # 4:  BLURRED VISION  (ICD-368.8) Pt reports periodic blurred vision.  May be related to hyperglycemia.  However, needs full opthamology eval to r/o ocular sarcoid.  Also given diabetes, needs eval for possible diabetic retinopathy as she appears to have kidney disease related to this.  Problem # 5:  DIABETES MELLITUS,  TYPE II, UNCONTROLLED (ICD-250.02) A1c poorly controlled.  Increasing glipizide but I'm not sure this will help much.  Do not have her meter to make adjustments in her insulin today but will f/u in one week.  Her updated medication list for this problem includes:    Metformin Hcl 1000 Mg Tabs (Metformin hcl) .Marland Kitchen... Take 1 tablet by mouth two times a day    Novolin 70/30 70-30 % Susp (Insulin isophane & regular) ..... Inject 35 units subcutaneously two times a day    Accupril 20 Mg Tabs (Quinapril hcl) .Marland Kitchen... Take two pills once daily.    Glipizide Xl 5 Mg Xr24h-tab (Glipizide) .Marland Kitchen... Take 2 tablets by mouth once a day  Labs Reviewed: Creat: 0.95 (06/02/2009)    Reviewed HgBA1c results: 13.1 (04/06/2009)  >14.0 (01/21/2009)  Complete Medication List: 1)  Prednisone 10 Mg Tabs (Prednisone) .... Take 3 1/2  tabs by mouth once daily 2)  Metformin Hcl 1000 Mg Tabs (Metformin hcl) .... Take 1 tablet by mouth two times a day 3)  Albuterol 90 Mcg/act Aers (Albuterol) .... Inhale 2 puffs four times a day as needed 4)  Calcium 500/d 500-200 Mg-unit Tabs (Calcium carbonate-vitamin d) .... Take 1 tablet by mouth three times a day 5)  Omeprazole 20 Mg Cpdr (Omeprazole) .... Take 1 tablet by mouth two times a day 6)  Novolin 70/30 70-30 % Susp (Insulin isophane & regular) .... Inject 35 units subcutaneously two times a day 7)  Insulin Syringe/needle 28g X 1/2" 0.5 Ml Misc (Insulin syringe-needle u-100) .... Per instruction 8)  Freestyle Lite Test Strp (Glucose blood) .... Use to check blood sugar 3x daily 9)  Lancets Misc (Lancets) .... Use to check blood sugar three times daily 10)  Zyrtec Allergy 10 Mg Caps  (Cetirizine hcl) .... Take 1 tablet by mouth once a day 11)  Nystatin Powd (Nystatin) .... Apply to affected area 2-3 times per day 12)  Chantix 1 Mg Tabs (Varenicline tartrate) .... Take 1 tablet by mouth once a day 13)  Accupril 20 Mg Tabs (Quinapril hcl) .... Take two pills once daily. 14)  Furosemide 40 Mg Tabs (Furosemide) .... Take 1 tablet by mouth twice a day 15)  K-lor 20 Meq Pack (Potassium chloride) .... Take 1 tablet by mouth once a day 16)  Toprol Xl 25 Mg Xr24h-tab (Metoprolol succinate) .... Take 1 tablet by mouth once a day 17)  Glipizide Xl 5 Mg Xr24h-tab (Glipizide) .... Take 2 tablets by mouth once a day  Other Orders: Capillary Blood Glucose/CBG ([id number]) T-Basic Metabolic Panel ([id number])  Patient Instructions: 1)  Please make a followup appointment in 1 week for a checkup. 2)  Please see the eye doctor tomorrow. 3)  Please decrease prednisone to 3 1/2 tabs once a day. 4)  Please increase lasix to 1 pill twice a day. 5)  Please increase glipizide to 2 pills a day. Prescriptions: FUROSEMIDE 40 MG TABS (FUROSEMIDE) Take 1 tablet by mouth twice a day  #60 x 0   Entered and Authorized by:   Junius Finner  MD   Signed by:   Junius Finner  MD on 06/16/2009   Method used:   Historical   [id number]   Prevention & Chronic Care Immunizations   Influenza vaccine: Fluvax 3+  (04/08/2008)   Influenza vaccine deferral: Deferred  (05/06/2009)   Influenza vaccine due: 12/30/2009    Tetanus booster: Not documented   Td booster deferral: Deferred  (05/06/2009)    Pneumococcal  vaccine: Not documented  Other Screening   Pap smear: NEGATIVE FOR INTRAEPITHELIAL LESIONS OR MALIGNANCY.  (03/11/2008)   Pap smear action/deferral: Deferred  (06/02/2009)    Mammogram: BI-RADS CATEGORY 2:  Benign finding(s).^MM DIGITAL DIAG LTD R  (04/20/2009)   Mammogram action/deferral: Ordered  (01/21/2009)   Smoking status: current  (06/16/2009)   Smoking cessation  counseling: yes  (06/16/2009)  Diabetes Mellitus   HgbA1C: 13.1  (04/06/2009)    Eye exam: Not documented   Diabetic eye exam action/deferral: Ophthalmology referral  (02/09/2009)    Foot exam: yes  (03/03/2009)   Foot exam action/deferral: Do today   High risk foot: Not documented   Foot care education: Done  (02/09/2009)   Foot exam due: 02/09/2010    Urine microalbumin/creatinine ratio: 3417.1  (04/06/2009)   Urine microalbumin action/deferral: Ordered    Diabetes flowsheet reviewed?: Yes   Progress toward A1C goal: Unchanged  Lipids   Total Cholesterol: 278  (01/21/2009)   Lipid panel action/deferral: LDL Direct Ordered   LDL: * mg/dL  (01/21/2009)   LDL Direct: 126  (02/09/2009)   HDL: 56  (01/21/2009)   Triglycerides: 403  (01/21/2009)  Hypertension   Last Blood Pressure: 139 / 80  (06/16/2009)   Serum creatinine: 0.95  (06/02/2009)   BMP action: Ordered   Serum potassium 3.8  (06/02/2009)    Hypertension flowsheet reviewed?: Yes   Progress toward BP goal: Improved  Self-Management Support :   Personal Goals (by the next clinic visit) :     Personal A1C goal: 7  (02/09/2009)     Personal blood pressure goal: 130/80  (01/21/2009)     Personal LDL goal: 100  (05/06/2009)    Patient will work on the following items until the next clinic visit to reach self-care goals:     Medications and monitoring: take my medicines every day, check my blood sugar, bring all of my medications to every visit, examine my feet every day  (06/16/2009)     Eating: drink diet soda or water instead of juice or soda, eat more vegetables, eat foods that are low in salt, eat baked foods instead of fried foods, eat fruit for snacks and desserts  (06/16/2009)     Activity: take a 30 minute walk every day  (06/02/2009)    Diabetes self-management support: Written self-care plan  (06/02/2009)   Last diabetes self-management training by diabetes educator: 01/28/2009    Hypertension  self-management support: Written self-care plan  (06/02/2009)  Process Orders Check Orders Results:     [facility]: ABN not required for this insurance Tests Sent for requisitioning (June 16, 2009 10:12 PM):     06/16/2009: [facility] -- T-Basic Metabolic Panel 0000000 (signed)

## 2010-05-31 NOTE — Assessment & Plan Note (Signed)
Summary: HFU-BLOOD GLUCOSE/POTASSIUM LEVEL PER SAMS AI OFF ROTATION   Vital Signs:  Patient Profile:   40 Years Old Female Weight:      223 pounds (101.36 kg) Temp:     98.7 degrees F (37.06 degrees C) oral Pulse rate:   93 / minute BP sitting:   134 / 90  (right arm)  Pt. in pain?   no  Vitals Entered By: Gerlean Ren RN (July 11, 2006 9:59 AM)              Is Patient Diabetic? Yes  Nutritional Status Obese CBG Result 176  Does patient need assistance? Functional Status Self care Ambulation Normal   Chief Complaint:  hosp f/u.  History of Present Illness: This is a  year old woman with a past medical history of Sarcoidosis (Dr Melvyn Novas) QT prolongation Diabetes mellitus, type II Hypertension Heart catheterization 06-06-06 : No CAD, no RAS, normal EF Hx eclampsia Hx of scalp seborrheic dermatitis   who was admitted to the hospital in early February 2008 for sarcoidosis exacerbation with dyspnea and desaturation, as well as QT prolongation.  She underwent cardiac catheterization which was normal.  She also had a CT of the chest which showed bulky mediastinal, hilar, prepericardiac, axillary, retrocrural, and upper abdominal lymphadenopathies (unchanged from 03-2005).  She also had interval progression of pulmonary fibrosis with new areas of patchy ground glass opacities. She has been feeling ok since her discharge with minimal shortness of breath and wheezing.  She is still taking prednisone 20 mg, 3 tabs daily.  It has been 5 weeks now.  She was on prednisone before and was able to taper to off, but she is feeling scared at this point.  She has not seen Dr Melvyn Novas in a while.  She is still a current smoker and says that she is really motivated to quit now, especially after being hospitalized.  She wants to see her son grow up.  The nicotine patch is giving her a rash.  While she was in the hospital, her HCTZ was stopped and she was started on lisinopril secondary to  hypokalemia.  She admits that she has been taking an occasionnal dose of over-the-counter potassium pills since her discharge, but this was rare.    Prior Medications (reviewed today): METFORMIN HCL 500 MG TABS (METFORMIN HCL) Take 1 tablet by mouth once a day LISINOPRIL 10 MG TABS (LISINOPRIL) Take 1 tablet by mouth once a day LOPRESSOR 50 MG TABS (METOPROLOL TARTRATE) Take 1/2 tablet by mouth two times a day PREDNISONE 20 MG TABS (PREDNISONE) Take 3 tablets by mouth once a day FAMOTIDINE 20 MG TABS (FAMOTIDINE) Take 1 tablet by mouth twice a day Current Allergies (reviewed today): No known allergies     Risk Factors:  Tobacco use:  current    Cigarettes:  Yes -- 1/3 pack(s) per day   Review of Systems  General      Denies chills, fatigue, and fever.  CV      Complains of shortness of breath with exertion.      Denies chest pain or discomfort, fatigue, lightheadness, and palpitations.  Resp      Complains of wheezing.      Denies cough and sputum productive.  GI      Denies abdominal pain, bloody stools, and change in bowel habits.  GU      Denies abnormal vaginal bleeding, dysuria, and urinary frequency.   Physical Exam  General:     alert, well-developed,  and overweight-appearing.   Eyes:     vision grossly intact, pupils equal, pupils round, and pupils reactive to light.   Lungs:     normal respiratory effort and normal breath sounds. Wheezes bilaterally but very mild with good air movement. Heart:     normal rate, regular rhythm, no murmur, no gallop, and no rub.   Abdomen:     soft, non-tender, and normal bowel sounds.      Impression & Recommendations:  Problem # 1:  PULMONARY SARCOIDOSIS (ICD-135) She is to continue with prednisone 60 mg daily for now, and we can start tapering it down slowly in the next few weeks.  She actually has an appointment with Dr Melvyn Novas that we scheduled today for April 16th at 2h45 PM, who should be able to take care of the  tapering of her steroids.  I also gave her a script for albuterol as needed.  I strongly advised her to stop smoking.  See below.  Problem # 2:  TOBACCO USE (ICD-305.1) She definitely needs to quit smoking given her history of sarcoidosis with progression of her pulmonary fibrosis.  Will start her on Chantix, and offered extra help with Nicotine gum if this is not enough.   Her updated medication list for this problem includes:    Chantix Starting Month Pak 0.5 Mg X 11 & 1 Mg X 42 Misc (Varenicline tartrate) .Marland Kitchen... Take as directed    Chantix 1 Mg Tabs (Varenicline tartrate) .Marland Kitchen... Take 1 tablet by mouth two times a day   Problem # 3:  HYPERTENSION (ICD-401.9) Continue current meds as is.  Hopefully will get even better when steroids tapered down. Her updated medication list for this problem includes:    Lisinopril 10 Mg Tabs (Lisinopril) .Marland Kitchen... Take 1 tablet by mouth once a day    Lopressor 50 Mg Tabs (Metoprolol tartrate) .Marland Kitchen... Take 1/2 tablet by mouth two times a day  BP today: 134/90   Problem # 4:  DIABETES MELLITUS, TYPE II (ICD-250.00) Steroids induced. Her Hgb A1C is still in goal range.  Will continue as is with metformin once a day. Her updated medication list for this problem includes:    Metformin Hcl 500 Mg Tabs (Metformin hcl) .Marland Kitchen... Take 1 tablet by mouth once a day    Lisinopril 10 Mg Tabs (Lisinopril) .Marland Kitchen... Take 1 tablet by mouth once a day  Orders: T- Capillary Blood Glucose [id number]) T-Hgb A1C (in-house) [id number])  Labs Reviewed: HgBA1c: 6.2 (07/11/2006)      Medications Added to Medication List This Visit: 1)  Chantix Starting Month Pak 0.5 Mg X 11 & 1 Mg X 42 Misc (Varenicline tartrate) .... Take as directed 2)  Chantix 1 Mg Tabs (Varenicline tartrate) .... Take 1 tablet by mouth two times a day 3)  Albuterol 90 Mcg/act Aers (Albuterol) .... Inhale 2 puffs four times a day as needed  Other Orders: T-Basic Metabolic Panel ([id number])   Patient  Instructions: 1)  Please schedule a follow-up appointment in 6 months. 2)  Keep your appointment with Dr Melvyn Novas on April 16th.  Laboratory Results   Blood Tests   Date/Time Recieved: July 11, 2006 10:12 AM  Date/Time Reported: July 11, 2006 10:12 AM ..................................................................Marland KitchenTracey Fulcher  July 11, 2006 10:12 AM   HGBA1C: 6.2%   (Normal Range: Non-Diabetic - 3-6%   Control Diabetic - 6-8%) CBG Random: 176     Appended Document: HFU-BLOOD GLUCOSE/POTASSIUM LEVEL PER SAMS AI OFF ROTATION Prescriptions: ALBUTEROL 90 MCG/ACT AERS (ALBUTEROL)  Inhale 2 puffs four times a day as needed  #1 x prn   Entered and Authorized by:   Mohammed Kindle MD   Signed by:   Mohammed Kindle MD on 07/11/2006   Method used:   Print then Give to Patient   RxID:   [id number] CHANTIX 1 MG TABS (VARENICLINE TARTRATE) Take 1 tablet by mouth two times a day  #60 x 5   Entered and Authorized by:   Mohammed Kindle MD   Signed by:   Mohammed Kindle MD on 07/11/2006   Method used:   Print then Give to Patient   RxID:   [id number] [place] PAK 0.5 MG X 11 & 1 MG X 42 MISC (VARENICLINE TARTRATE) Take as directed  #1 pack x 0   Entered and Authorized by:   Mohammed Kindle MD   Signed by:   Mohammed Kindle MD on 07/11/2006   Method used:   Print then Give to Patient   [id number]     Clinical Lists Changes  Medications: Rx of CHANTIX STARTING MONTH PAK 0.5 MG X 11 & 1 MG X 42 MISC (VARENICLINE TARTRATE) Take as directed;  #1 pack x 0;  Signed;  Entered by: Mohammed Kindle MD;  Authorized by: Mohammed Kindle MD;  Method used: Print then Give to Patient Rx of CHANTIX 1 MG TABS (VARENICLINE TARTRATE) Take 1 tablet by mouth two times a day;  #60 x 5;  Signed;  Entered by: Mohammed Kindle MD;  Authorized by: Mohammed Kindle MD;  Method used: Print then Give to Patient Rx of ALBUTEROL 90 MCG/ACT AERS (ALBUTEROL) Inhale 2  puffs four times a day as needed;  #1 x prn;  Signed;  Entered by: Mohammed Kindle MD;  Authorized by: Mohammed Kindle MD;  Method used: Print then Give to Patient

## 2010-05-31 NOTE — Letter (Signed)
Summary: LOG BOOK REPORT  LOG BOOK REPORT   Imported By: Garlan Fillers 12/30/2009 10:20:53  _____________________________________________________________________  External Attachment:    Type:   Image     Comment:   External Document

## 2010-05-31 NOTE — Progress Notes (Signed)
Summary: Refill/gh  Phone Note Refill Request Message from:  Fax from Pharmacy on December 31, 2009 12:51 PM  Refills Requested: Medication #1:  FUROSEMIDE 40 MG TABS 1 tablet by mouth daily.   Last Refilled: 12/03/2009  Method Requested: Electronic Initial call taken by: Sander Nephew RN,  December 31, 2009 12:51 PM    Prescriptions: FUROSEMIDE 40 MG TABS (FUROSEMIDE) 1 tablet by mouth daily  #30 x 0   Entered and Authorized by:   Rudie Meyer MD   Signed by:   Rudie Meyer MD on 01/03/2010   Method used:   Faxed to ...       [facility] (retail)       [address] [place], [place]  [address]       Ph: [phone]       Fax: [phone]   RxID:   [id number]

## 2010-05-31 NOTE — Progress Notes (Signed)
Summary: phone note/gp  Phone Note Call from Patient   Caller: Patient Summary of Call: Pt. c/o being  very  SOB and increased sweating. She sounds SOB while talking on the telephone. States CBG this a.m. fasting was 228.  She had ran out out Prednisone last Friday. Restart taking Prednison 15mg  yesterday. Also states Dr. Redmond Pulling wanted her to startat 25mg  then down to 20mg  and by the time of her next appt. she would be at 15mg  of Prednisone. Initial call taken by: Morrison Old RN,  April 22, 2009 3:26 PM  Follow-up for Phone Call        Send her to the ER. Follow-up by: Milta Deiters MD,  April 22, 2009 3:29 PM  Additional Follow-up for Phone Call Additional follow up Details #1::        Pt. was called back and instructed to go to the ED per the Attending. Pt.apprehensive about being admitted d/t the holiday and her children but finally agreed to go. Additional Follow-up by: Morrison Old RN,  April 22, 2009 3:38 PM

## 2010-05-31 NOTE — Progress Notes (Signed)
  Phone Note Outgoing Call   Call placed by: Larey Dresser MD,  December 06, 2009 4:21 PM Call placed to: Patient Summary of Call: I saw that pt was no longer on Dr [facility] schedule and was concerned since we had made sig medication changes. Her legs are still swollen and she is still tired. But her breathing is better and her stomach "pulling" numbness sensation is better. She has an appt tomorrow.

## 2010-05-31 NOTE — Assessment & Plan Note (Signed)
Summary: hfu [mkj]   Vital Signs:  Patient profile:   40 year old female Height:      61 inches Weight:      225.2 pounds BMI:     42.70 Temp:     98.1 degrees F oral Pulse rate:   110 / minute BP sitting:   169 / 107  (right arm)  Vitals Entered By: Silverio Decamp NT II (May 06, 2009 3:06 PM) CC: HFU Is Patient Diabetic? Yes Did you bring your meter with you today? No Pain Assessment Patient in pain? no      Nutritional Status BMI of > 30 = obese  Have you ever been in a relationship where you felt threatened, hurt or afraid?No   Does patient need assistance? Functional Status Self care Ambulation Normal   Primary Care Provider:  Junius Finner  MD  CC:  HFU.  History of Present Illness: 40 yrs old with sarcoid and worsened CHF recnetly admitted to the hospital present for HFU. She has not been taking lasix since discharge as she is yet to get her orange card.  She is having some shortness of breath.    Preventive Screening-Counseling & Management  Alcohol-Tobacco     Alcohol drinks/day: 0     Smoking Status: current     Smoking Cessation Counseling: yes     Packs/Day: 1/3     Year Started: 25+ years  Caffeine-Diet-Exercise     Does Patient Exercise: no  Current Medications (verified): 1)  Prednisone 10 Mg Tabs (Prednisone) .... Take 4 Tabs By Mouth Once Daily 2)  Metformin Hcl 1000 Mg Tabs (Metformin Hcl) .... Take 1 Tablet By Mouth Two Times A Day 3)  Albuterol 90 Mcg/act Aers (Albuterol) .... Inhale 2 Puffs Four Times A Day As Needed 4)  Calcium 500/d 500-200 Mg-Unit Tabs (Calcium Carbonate-Vitamin D) .... Take 1 Tablet By Mouth Three Times A Day 5)  Omeprazole 20 Mg Cpdr (Omeprazole) .... Take 1 Tablet By Mouth Two Times A Day 6)  Novolin 70/30 70-30 % Susp (Insulin Isophane & Regular) .... Inject 30 Units Subcutaneously Two Times A Day 7)  Insulin Syringe/needle 28g X 1/2" 0.5 Ml Misc (Insulin Syringe-Needle U-100) .... Per Instruction 8)  Freestyle  Lite Test  Strp (Glucose Blood) .... Use To Check Blood Sugar 3x Daily 9)  Lancets  Misc (Lancets) .... Use To Check Blood Sugar Three Times Daily 10)  Zyrtec Allergy 10 Mg Caps (Cetirizine Hcl) .... Take 1 Tablet By Mouth Once A Day 11)  Nystatin  Powd (Nystatin) .... Apply To Affected Area 2-3 Times Per Day 12)  Chantix 1 Mg Tabs (Varenicline Tartrate) .... Take 1 Tablet By Mouth Once A Day 13)  Accupril 20 Mg Tabs (Quinapril Hcl) .... Two Times A Day 14)  Furosemide 40 Mg Tabs (Furosemide) .... Take 1 Tablet By Mouth Once A Day 15)  K-Lor 20 Meq Pack (Potassium Chloride) .... Take 1 Tablet By Mouth Once A Day 16)  Toprol Xl 25 Mg Xr24h-Tab (Metoprolol Succinate) .... Take 1 Tablet By Mouth Once A Day 17)  Glipizide Xl 2.5 Mg Xr24h-Tab (Glipizide) .... Take 1 Tablet By Mouth Once A Day  Allergies (verified): No Known Drug Allergies  Past History:  Past Medical History: Last updated: 05/22/2008 Sarcoidosis (Dr Melvyn Novas) QT prolongation Diabetes mellitus, type II Hypertension Heart catheterization 06-06-06 : No CAD, no RAS, normal EF Hx eclampsia Hx of scalp seborrheic dermatitis Abnormal LFT's:   Alk phos 713, TB 1.3, AST  52, ALT 18 in 11/09- Liver u/s and exam c/w HSM.  Hep B                             serology neg but Hep C ab +, HIV neg.  Coags slightly prolonged and alb low.  Started                                           empirically on prednisone for GI sarcoid.  AMA and Hep C viral load neg.                 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  Past Surgical History: Last updated: 03/22/2007 Tubal ligation  Family History: Last updated: 03/11/2008 Children-healthy Mom-Colon ca Merrily Pew  Social History: Last updated: 04/06/2009 She is single, two children that are healthy.  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  Smokes 1/2 ppd x 20 years.  She denies illegal drugs and alcohol use.    Risk Factors: Alcohol Use: 0 (05/06/2009) Exercise: no  (05/06/2009)  Risk Factors: Smoking Status: current (05/06/2009) Packs/Day: 1/3 (05/06/2009)  Review of Systems      See HPI  Physical Exam  General:  alert, well-developed, and well-nourished.   Head:  atraumatic.   Eyes:  pupils equal, pupils round, and pupils reactive to light.   Ears:  no external deformities.   Nose:  no external erythema.   Mouth:  pharynx pink and moist.   Neck:  supple, full ROM, and no masses.   Lungs:  no intercostal retractions, no accessory muscle use, and normal breath sounds.   Heart:  regular rhythm, no gallop, no JVD, no HJR, and tachycardia.   Abdomen:  soft, non-tender, normal bowel sounds, no distention, and no masses.   Msk:  normal ROM and no joint tenderness.   Extremities:  left pretibial edema and right pretibial edema.   Neurologic:  alert & oriented X3, cranial nerves II-XII intact, and gait normal.   Skin:  turgor normal, color normal, no petechiae, and no purpura.   Psych:  Oriented X3, normally interactive, good eye contact, and not anxious appearing.     Impression & Recommendations:  Problem # 1:  CONGESTIVE HEART FAILURE (ICD-428.0) Ms. Simkin is doing better overall. She is not taking lasix yet. I have encouraged her to start taking it ASAP. She is now approved for orange card and voices understanding. She will start taking it today.  Her updated medication list for this problem includes:    Accupril 20 Mg Tabs (Quinapril hcl) .Marland Kitchen..Marland Kitchen Two times a day    Furosemide 40 Mg Tabs (Furosemide) .Marland Kitchen... Take 1 tablet by mouth once a day    Toprol Xl 25 Mg Xr24h-tab (Metoprolol succinate) .Marland Kitchen... Take 1 tablet by mouth once a day  Problem # 2:  NEPHROTIC SYNDROME (ICD-581.9) Her proteinuria was worked up inpatient. It likely is due to diabetes. She does not have evidence of connective tissue disorder.   Problem # 3:  DIABETES MELLITUS, TYPE II, UNCONTROLLED (ICD-250.02) Her CBGs are quite elevated. She has not brought her meter today. She  informs me that her sugars are around 200 for most part. I will increase her insulin to 30 units two times a day. I have asked her to bring her meter next time with her  in 2 weeks.  Her updated medication list for this problem includes:    Metformin Hcl 1000 Mg Tabs (Metformin hcl) .Marland Kitchen... Take 1 tablet by mouth two times a day    Novolin 70/30 70-30 % Susp (Insulin isophane & regular) ..... Inject 30 units subcutaneously two times a day    Accupril 20 Mg Tabs (Quinapril hcl) .Marland Kitchen..Marland Kitchen Two times a day    Glipizide Xl 2.5 Mg Xr24h-tab (Glipizide) .Marland Kitchen... Take 1 tablet by mouth once a day  Labs Reviewed: Creat: 0.67 (04/06/2009)    Reviewed HgBA1c results: 13.1 (04/06/2009)  >14.0 (01/21/2009)  Problem # 4:  HYPERTENSION (ICD-401.9) Elevated likely secondary to steroid use. I have increased her accupril dose and she also will start lasix. Both of which would help lower her pressure. She has f/u in 2 weeks.  Her updated medication list for this problem includes:    Accupril 20 Mg Tabs (Quinapril hcl) .Marland Kitchen..Marland Kitchen Two times a day    Furosemide 40 Mg Tabs (Furosemide) .Marland Kitchen... Take 1 tablet by mouth once a day    Toprol Xl 25 Mg Xr24h-tab (Metoprolol succinate) .Marland Kitchen... Take 1 tablet by mouth once a day  BP today: 169/107 Prior BP: 173/103 (04/06/2009)  Labs Reviewed: K+: 4.0 (04/06/2009) Creat: : 0.67 (04/06/2009)   Chol: 278 (01/21/2009)   HDL: 56 (01/21/2009)   LDL: * mg/dL (01/21/2009)   TG: 403 (01/21/2009)  Orders: T-Basic Metabolic Panel (99991111)  Problem # 5:  NICOTINE ADDICTION (ICD-305.1) She continues to smoke. She has requested chantix through [facility] program but yet to be approved. Once again advised on quiting smoking.  Her updated medication list for this problem includes:    Chantix 1 Mg Tabs (Varenicline tartrate) .Marland Kitchen... Take 1 tablet by mouth once a day  Complete Medication List: 1)  Prednisone 10 Mg Tabs (Prednisone) .... Take 4 tabs by mouth once daily 2)  Metformin Hcl 1000 Mg Tabs  (Metformin hcl) .... Take 1 tablet by mouth two times a day 3)  Albuterol 90 Mcg/act Aers (Albuterol) .... Inhale 2 puffs four times a day as needed 4)  Calcium 500/d 500-200 Mg-unit Tabs (Calcium carbonate-vitamin d) .... Take 1 tablet by mouth three times a day 5)  Omeprazole 20 Mg Cpdr (Omeprazole) .... Take 1 tablet by mouth two times a day 6)  Novolin 70/30 70-30 % Susp (Insulin isophane & regular) .... Inject 30 units subcutaneously two times a day 7)  Insulin Syringe/needle 28g X 1/2" 0.5 Ml Misc (Insulin syringe-needle u-100) .... Per instruction 8)  Freestyle Lite Test Strp (Glucose blood) .... Use to check blood sugar 3x daily 9)  Lancets Misc (Lancets) .... Use to check blood sugar three times daily 10)  Zyrtec Allergy 10 Mg Caps (Cetirizine hcl) .... Take 1 tablet by mouth once a day 11)  Nystatin Powd (Nystatin) .... Apply to affected area 2-3 times per day 12)  Chantix 1 Mg Tabs (Varenicline tartrate) .... Take 1 tablet by mouth once a day 13)  Accupril 20 Mg Tabs (Quinapril hcl) .... Two times a day 14)  Furosemide 40 Mg Tabs (Furosemide) .... Take 1 tablet by mouth once a day 15)  K-lor 20 Meq Pack (Potassium chloride) .... Take 1 tablet by mouth once a day 16)  Toprol Xl 25 Mg Xr24h-tab (Metoprolol succinate) .... Take 1 tablet by mouth once a day 17)  Glipizide Xl 2.5 Mg Xr24h-tab (Glipizide) .... Take 1 tablet by mouth once a day  Patient Instructions: 1)  Please schedule a follow-up  appointment in 1 week. OK to schedule with Dr. Elie Confer and cancel the appointment with Dr. Amalia Hailey.  Prescriptions: ACCUPRIL 20 MG TABS (QUINAPRIL HCL) two times a day  #60 x 0   Entered and Authorized by:   Pershing Cox MD   Signed by:   Pershing Cox MD on 05/06/2009   Method used:   Print then Give to Patient   RxID:   [id number]   Prevention & Chronic Care Immunizations   Influenza vaccine: Fluvax 3+  (04/08/2008)   Influenza vaccine deferral: Deferred  (05/06/2009)   Influenza  vaccine due: 12/30/2009    Tetanus booster: Not documented   Td booster deferral: Deferred  (05/06/2009)    Pneumococcal vaccine: Not documented  Other Screening   Pap smear: NEGATIVE FOR INTRAEPITHELIAL LESIONS OR MALIGNANCY.  (03/11/2008)    Mammogram: BI-RADS CATEGORY 2:  Benign finding(s).^MM DIGITAL DIAG LTD R  (04/20/2009)   Mammogram action/deferral: Ordered  (01/21/2009)   Smoking status: current  (05/06/2009)   Smoking cessation counseling: yes  (05/06/2009)  Diabetes Mellitus   HgbA1C: 13.1  (04/06/2009)    Eye exam: Not documented   Diabetic eye exam action/deferral: Ophthalmology referral  (02/09/2009)    Foot exam: yes  (03/03/2009)   Foot exam action/deferral: Do today   High risk foot: Not documented   Foot care education: Done  (02/09/2009)   Foot exam due: 02/09/2010    Urine microalbumin/creatinine ratio: 3417.1  (04/06/2009)   Urine microalbumin action/deferral: Ordered    Diabetes flowsheet reviewed?: Yes   Progress toward A1C goal: Unchanged  Lipids   Total Cholesterol: 278  (01/21/2009)   Lipid panel action/deferral: LDL Direct Ordered   LDL: * mg/dL  (01/21/2009)   LDL Direct: 126  (02/09/2009)   HDL: 56  (01/21/2009)   Triglycerides: 403  (01/21/2009)  Hypertension   Last Blood Pressure: 169 / 107  (05/06/2009)   Serum creatinine: 0.67  (04/06/2009)   BMP action: Ordered   Serum potassium 4.0  (04/06/2009)    Hypertension flowsheet reviewed?: Yes   Progress toward BP goal: Unchanged  Self-Management Support :   Personal Goals (by the next clinic visit) :     Personal A1C goal: 7  (02/09/2009)     Personal blood pressure goal: 130/80  (01/21/2009)     Personal LDL goal: 100  (05/06/2009)    Patient will work on the following items until the next clinic visit to reach self-care goals:     Medications and monitoring: take my medicines every day, check my blood sugar  (05/06/2009)     Eating: drink diet soda or water instead of juice or  soda, eat more vegetables, eat foods that are low in salt, eat baked foods instead of fried foods, eat fruit for snacks and desserts  (05/06/2009)     Activity: take a 30 minute walk every day  (05/06/2009)    Diabetes self-management support: Written self-care plan  (05/06/2009)   Diabetes care plan printed   Last diabetes self-management training by diabetes educator: 01/28/2009    Hypertension self-management support: Written self-care plan  (05/06/2009)   Hypertension self-care plan printed.   Nursing Instructions: CBG today (see order)    Process Orders Check Orders Results:     [facility]: [id number] not required for this insurance Tests Sent for requisitioning (May 06, 2009 11:16 PM):     05/06/2009: [facility] -- T-Basic Metabolic Panel [id number] (signed)

## 2010-05-31 NOTE — Discharge Summary (Signed)
Summary: Hospital Discharge Update    Hospital Discharge Update:  Date of Admission: 09/29/2009 Date of Discharge: 10/01/2009  Brief Summary:  1. ARF 2/2 dehydration, resolving 2. N/V ->?gastroenteritis vs mild pancreatitis vs gastroparesis->resolved 3. New liver lesions ->likely, due to sarcoid. 4. ?UTI->treated. 5. Sarcoidosis->Prednisone restarted.  Labs needed at follow-up: Basic metabolic panel  Other follow-up issues:  Repeat Liver US ins 6 months to reevaluate liver lesions.  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 - Signed Removed medication of PREDNISONE 1 MG TABS (PREDNISONE) Take as directed. - Signed Changed medication from ACCUPRIL 40 MG TABS (QUINAPRIL HCL) Take two tabs by mouth once a day. to ACCUPRIL 40 MG TABS (QUINAPRIL HCL) Take two tabs by mouth once a day. - Signed Changed medication from FUROSEMIDE 40 MG TABS (FUROSEMIDE) Take 1 tablet by mouth twice a day to FUROSEMIDE 40 MG TABS (FUROSEMIDE) Take 1 tablet by mouth twice a day - Signed Changed medication from K-LOR 20 MEQ PACK (POTASSIUM CHLORIDE) Take 1 tablet by mouth once a day to K-LOR 20 MEQ PACK (POTASSIUM CHLORIDE) Take 1 tablet by mouth once a day - Signed  The medication, problem, and allergy lists have been updated.  Please see the dictated discharge summary for details.  Discharge medications:  PREDNISONE 5 MG TABS (PREDNISONE) Take as directed. METFORMIN HCL 1000 MG TABS (METFORMIN HCL) Take 1 tablet by mouth two times a day ALBUTEROL 90 MCG/ACT AERS (ALBUTEROL) Inhale 2 puffs four times a day as needed CALCIUM 500/D  500-200 MG-UNIT TABS (CALCIUM CARBONATE-VITAMIN D) Take 1 tablet by mouth three times a day OMEPRAZOLE 20 MG CPDR (OMEPRAZOLE) Take 1 tablet by mouth two times a day NOVOLIN 70/30 70-30 % SUSP (INSULIN ISOPHANE & REGULAR) Inject 35 units subcutaneously two times a day INSULIN SYRINGE/NEEDLE 28G X 1/2" 0.5 ML MISC (INSULIN SYRINGE-NEEDLE U-100) per instruction FREESTYLE LITE TEST  STRP (GLUCOSE BLOOD) use to check blood sugar 3x daily LANCETS  MISC (LANCETS) use to check blood sugar three times daily ACCUPRIL 40 MG TABS (QUINAPRIL HCL) Take two tabs by mouth once a day. FUROSEMIDE 40 MG TABS (FUROSEMIDE) Take 1 tablet by mouth twice a day K-LOR 20 MEQ PACK (POTASSIUM CHLORIDE) Take 1 tablet by mouth once a day TOPROL XL 25 MG XR24H-TAB (METOPROLOL SUCCINATE) Take 1 tablet by mouth once a day  Other patient instructions:  Please, call [facility] for a hospital follow up appointment with Dr. Redmond Pulling. Do not take Furosemide, Accupril, Toprol and Potassium pills until instructed otherwise. Call with any questions.  Note: Hospital Discharge Medications & Other Instructions handout was printed, one copy for patient and a second copy to be placed in hospital chart.

## 2010-05-31 NOTE — Assessment & Plan Note (Signed)
Summary: 2WK F/U/EST/VS   Vital Signs:  Patient profile:   40 year old female Height:      61 inches (154.94 cm) Weight:      234.02 pounds (106.37 kg) BMI:     44.38 Temp:     99.3 degrees F (37.39 degrees C) oral Pulse rate:   60 / minute BP sitting:   128 / 86  (right arm)  Vitals Entered By: Sander Nephew RN (July 12, 2009 8:30 AM) Is Patient Diabetic? Yes Did you bring your meter with you today? Yes Pain Assessment Patient in pain? no      Nutritional Status BMI of > 30 = obese CBG Result 263  Have you ever been in a relationship where you felt threatened, hurt or afraid?No   Does patient need assistance? Functional Status Self care Ambulation Normal Comments Check up.  Needs refills on Glipizide.   Primary Care Provider:  Junius Finner  MD   History of Present Illness: Pt is a pleasabt 40 yo female w/ past med hx below here for routine f/u.  She is doing well and her edema is improving.  She has decreased the prednisone without worsening symptoms.  She denies chest pain, worsening of chronic SOB/cough, nausea/vomiting.  Her blood sugars are doing better since decreasing the prednisone and have been in the 100's-200's range.  No low blood sugars.   She has not quit smoking yet but has been cutting back.  Depression History:      The patient denies a depressed mood most of the day and a diminished interest in her usual daily activities.         Preventive Screening-Counseling & Management  Alcohol-Tobacco     Alcohol drinks/day: 0     Smoking Status: current     Smoking Cessation Counseling: yes     Packs/Day: 1/3     Year Started: 25+ years  Current Medications (verified): 1)  Prednisone 10 Mg Tabs (Prednisone) .... Take 2  Tabs By Mouth Once Daily 2)  Metformin Hcl 1000 Mg Tabs (Metformin Hcl) .... Take 1 Tablet By Mouth Two Times A Day 3)  Albuterol 90 Mcg/act Aers (Albuterol) .... Inhale 2 Puffs Four Times A Day As Needed 4)  Calcium 500/d 500-200  Mg-Unit Tabs (Calcium Carbonate-Vitamin D) .... Take 1 Tablet By Mouth Three Times A Day 5)  Omeprazole 20 Mg Cpdr (Omeprazole) .... Take 1 Tablet By Mouth Two Times A Day 6)  Novolin 70/30 70-30 % Susp (Insulin Isophane & Regular) .... Inject 35 Units Subcutaneously Two Times A Day 7)  Insulin Syringe/needle 28g X 1/2" 0.5 Ml Misc (Insulin Syringe-Needle U-100) .... Per Instruction 8)  Freestyle Lite Test  Strp (Glucose Blood) .... Use To Check Blood Sugar 3x Daily 9)  Lancets  Misc (Lancets) .... Use To Check Blood Sugar Three Times Daily 10)  Zyrtec Allergy 10 Mg Caps (Cetirizine Hcl) .... Take 1 Tablet By Mouth Once A Day 11)  Nystatin  Powd (Nystatin) .... Apply To Affected Area 2-3 Times Per Day 12)  Chantix 1 Mg Tabs (Varenicline Tartrate) .... Take 1 Tablet By Mouth Once A Day 13)  Accupril 40 Mg Tabs (Quinapril Hcl) .... Take Two Tabs By Mouth Once A Day. 14)  Furosemide 40 Mg Tabs (Furosemide) .... Take 1 Tablet By Mouth Twice A Day 15)  K-Lor 20 Meq Pack (Potassium Chloride) .... Take 1 Tablet By Mouth Once A Day 16)  Toprol Xl 25 Mg Xr24h-Tab (Metoprolol Succinate) .... Take  1 Tablet By Mouth Once A Day 17)  Glipizide Xl 5 Mg Xr24h-Tab (Glipizide) .... Take 2 Tablets By Mouth Once A Day  Allergies (verified): No Known Drug Allergies  Past History:  Past Medical History: Last updated: 06/23/2009 Sarcoidosis (Dr Melvyn Novas) QT prolongation Diabetes mellitus, type II Hypertension Heart catheterization 06-06-06 : No CAD, no RAS, normal EF Hx eclampsia Hx of scalp seborrheic dermatitis Abnormal LFT's:   Alk phos 713, TB 1.3, AST 52, ALT 18 in 11/09- Liver u/s and exam c/w HSM.  Hep B                             serology neg but Hep C ab +, HIV neg.  Coags slightly prolonged and alb low.  Started                                           empirically on prednisone for GI sarcoid.  AMA and Hep C viral load neg.                 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  Smokes 1/2 ppd x 20 years.  She denies illegal drugs and alcohol use.    Social History: Reviewed history from 04/06/2009 and no changes required. She is single, two children that are healthy.  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  Smokes 1/2 ppd x 20 years.  She denies illegal drugs and alcohol use.    Review of Systems       as per HPI.  Physical Exam  General:  alert, well-developed, and appropriate dress.   Eyes:  anicteric. Mouth:  MMM, no thrush. Lungs:  mild wheezing on R, otherwise clear, no crackles, nl effort.  Heart:  RRR, no m/r/g. Abdomen:  +BS's, soft, NT and ND, obese. Extremities:  trace-1+ edema bilateral lower extremities-much improved.  Neurologic:  gait normal.  Psych:  mood euthymic, appropriately interactive.   Impression & Recommendations:  Problem # 1:  NEPHROTIC SYNDROME (ICD-581.9) Edema improving.  BP around goal.  Cont ACE I, diuretics, and blood sugar control.  Problem # 2:  DIABETES MELLITUS, TYPE II, UNCONTROLLED (ICD-250.02) A1c improved.  Reviewed blood sugars and much improved w/ decrease in prednisone.  Will cont to decrease prednisone and warned pt of signs/symptoms of low blood sugars but suspect she should tolerate going to 10 mg without difficulty.  Up to date on eye exam and foot exam.  On ACE I.  No statin b/c lipids weren't that elevated, she has hepatic sarcoid, and she still menstruates and has the potential for pregnancy.  Her updated medication list for this problem includes:    Metformin Hcl 1000 Mg Tabs (Metformin hcl) .Marland Kitchen... Take 1 tablet by mouth two times a day    Novolin 70/30 70-30 %  Susp (Insulin isophane & regular) ..... Inject 35 units subcutaneously two times a day    Accupril 40 Mg Tabs (Quinapril hcl) .Marland Kitchen... Take two tabs by mouth once a day.    Glipizide Xl 5 Mg  Xr24h-tab (Glipizide) .Marland Kitchen... Take 2 tablets by mouth once a day  Orders: T- Capillary Blood Glucose [id number]) T-Hgb A1C (in-house) [id number])  Labs Reviewed: Creat: 0.93 (06/23/2009)    Reviewed HgBA1c results: 9.8 (07/12/2009)  13.1 (04/06/2009)  Problem # 3:  CONGESTIVE HEART FAILURE (ICD-428.0) Plan on repeating 2D echo around 06/11. No evidence for decompensation.   Her updated medication list for this problem includes:    Accupril 40 Mg Tabs (Quinapril hcl) .Marland Kitchen... Take two tabs by mouth once a day.    Furosemide 40 Mg Tabs (Furosemide) .Marland Kitchen... Take 1 tablet by mouth twice a day    Toprol Xl 25 Mg Xr24h-tab (Metoprolol succinate) .Marland Kitchen... Take 1 tablet by mouth once a day  Problem # 4:  NICOTINE ADDICTION (ICD-305.1) Councelled on cesation.  Her updated medication list for this problem includes:    Chantix 1 Mg Tabs (Varenicline tartrate) .Marland Kitchen... Take 1 tablet by mouth once a day  Problem # 5:  SARCOIDOSIS (ICD-135) F/u LFT's today while decreasing prednisone.  Clinically appears stable.  At some point, would like to get repeat PFT's.  Cont PPI, ca/vit D while on prednisone.  No bisphosphonate b/c of risk of pregnancy.  Orders: T-Comprehensive Metabolic Panel ([id number])  Problem # 6:  OBESITY, MORBID (ICD-278.01) Weight stable.  Discussed weight loss strategies.  Hopefully will start to improve w/ pred decrease.  Problem # 7:  HYPERTENSION (ICD-401.9) BP around goal.  Not sexually active and reminded that she shouldn't get pregnant on her meds.  Her updated medication list for this problem includes:    Accupril 40 Mg Tabs (Quinapril hcl) .Marland Kitchen... Take two tabs by mouth once a day.    Furosemide 40 Mg Tabs (Furosemide) .Marland Kitchen... Take 1 tablet by mouth twice a day    Toprol Xl 25 Mg Xr24h-tab (Metoprolol  succinate) .Marland Kitchen... Take 1 tablet by mouth once a day  Complete Medication List: 1)  Prednisone 10 Mg Tabs (Prednisone) .... Take 2  tabs by mouth once daily 2)  Metformin Hcl 1000 Mg Tabs (Metformin hcl) .... Take 1 tablet by mouth two times a day 3)  Albuterol 90 Mcg/act Aers (Albuterol) .... Inhale 2 puffs four times a day as needed 4)  Calcium 500/d 500-200 Mg-unit Tabs (Calcium carbonate-vitamin d) .... Take 1 tablet by mouth three times a day 5)  Omeprazole 20 Mg Cpdr (Omeprazole) .... Take 1 tablet by mouth two times a day 6)  Novolin 70/30 70-30 % Susp (Insulin isophane & regular) .... Inject 35 units subcutaneously two times a day 7)  Insulin Syringe/needle 28g X 1/2" 0.5 Ml Misc (Insulin syringe-needle u-100) .... Per instruction 8)  Freestyle Lite Test Strp (Glucose blood) .... Use to check blood sugar 3x daily 9)  Lancets Misc (Lancets) .... Use to check blood sugar three times daily 10)  Zyrtec Allergy 10 Mg Caps (Cetirizine hcl) .... Take 1 tablet by mouth once a day 11)  Nystatin Powd (Nystatin) .... Apply to affected area 2-3 times per day 12)  Chantix 1 Mg Tabs (Varenicline tartrate) .... Take 1 tablet by mouth once a day 13)  Accupril 40 Mg Tabs (Quinapril hcl) .... Take two tabs by mouth once a day. 14)  Furosemide 40 Mg Tabs (Furosemide) .... Take 1 tablet by mouth twice a day 15)  K-lor 20 Meq Pack (Potassium chloride) .... Take 1 tablet by mouth once a day 16)  Toprol Xl 25 Mg Xr24h-tab (Metoprolol succinate) .... Take 1 tablet by mouth once a day 17)  Glipizide Xl 5 Mg Xr24h-tab (Glipizide) .... Take 2 tablets by mouth once a day  Patient Instructions: 1)  Please make a followup appointment in 1 month. 2)  Great job with your diabetes!  Continue your efforts at weight loss! 3)  Please try to quit smoking and see Lorie Phenix. 4)  Decrease prednisone to 15 mg today for two weeks, then go to 10 mg a day.  Prescriptions: GLIPIZIDE XL 5 MG XR24H-TAB (GLIPIZIDE) Take 2 tablets by  mouth once a day  #60 x 3   Entered and Authorized by:   Junius Finner  MD   Signed by:   Junius Finner  MD on 07/12/2009   Method used:   Print then Give to Patient   RxID:   [id number] NOVOLIN 70/30 70-30 % SUSP (INSULIN ISOPHANE & REGULAR) Inject 35 units subcutaneously two times a day  #4 bottles x prn   Entered and Authorized by:   Junius Finner  MD   Signed by:   Junius Finner  MD on 07/12/2009   Method used:   Print then Give to Patient   RxID:   [id number]    Vital Signs:  Patient profile:   40 year old female Height:      61 inches (154.94 cm) Weight:      234.02 pounds (106.37 kg) BMI:     44.38 Temp:     99.3 degrees F (37.39 degrees C) oral Pulse rate:   60 / minute BP sitting:   128 / 86  (right arm)  Vitals Entered By: Sander Nephew RN (July 12, 2009 8:30 AM)   Prevention & Chronic Care Immunizations   Influenza vaccine: Fluvax 3+  (04/08/2008)   Influenza vaccine deferral: Deferred  (05/06/2009)   Influenza vaccine due: 12/30/2009    Tetanus booster: Not documented   Td booster deferral: Deferred  (05/06/2009)    Pneumococcal vaccine: Not documented  Other Screening   Pap smear: NEGATIVE FOR INTRAEPITHELIAL LESIONS OR MALIGNANCY.  (03/11/2008)   Pap smear action/deferral: Deferred  (06/02/2009)    Mammogram: BI-RADS CATEGORY 2:  Benign finding(s).^MM DIGITAL DIAG LTD R  (04/20/2009)   Mammogram action/deferral: Ordered  (01/21/2009)   Smoking status: current  (07/12/2009)   Smoking cessation counseling: yes  (07/12/2009)  Diabetes Mellitus   HgbA1C: 9.8  (07/12/2009)    Eye exam: Not documented   Diabetic eye exam action/deferral: Ophthalmology referral  (02/09/2009)    Foot exam: yes  (03/03/2009)   Foot exam action/deferral: Do today   High risk foot: Not documented   Foot care education: Done  (02/09/2009)   Foot exam due: 02/09/2010    Urine microalbumin/creatinine ratio: 3417.1  (04/06/2009)   Urine microalbumin  action/deferral: Ordered    Diabetes flowsheet reviewed?: Yes   Progress toward A1C goal: Deteriorated  Lipids   Total Cholesterol: 278  (01/21/2009)   Lipid panel action/deferral: LDL Direct Ordered   LDL: * mg/dL  (01/21/2009)   LDL Direct: 126  (02/09/2009)   HDL: 56  (01/21/2009)   Triglycerides: 403  (01/21/2009)  Hypertension   Last Blood Pressure: 128 / 86  (07/12/2009)   Serum creatinine: 0.93  (06/23/2009)   BMP action: Ordered   Serum potassium 4.2  (06/23/2009) CMP ordered     Hypertension flowsheet reviewed?: Yes   Progress toward BP goal: Deteriorated  Self-Management Support :   Personal Goals (by the next clinic visit) :     Personal A1C goal: 7  (02/09/2009)  Personal blood pressure goal: 130/80  (01/21/2009)     Personal LDL goal: 100  (05/06/2009)    Patient will work on the following items until the next clinic visit to reach self-care goals:     Medications and monitoring: take my medicines every day, check my blood sugar, bring all of my medications to every visit, examine my feet every day  (07/12/2009)     Eating: drink diet soda or water instead of juice or soda, eat more vegetables, eat foods that are low in salt, eat baked foods instead of fried foods, eat fruit for snacks and desserts, limit or avoid alcohol  (07/12/2009)     Activity: take a 30 minute walk every day  (07/12/2009)    Diabetes self-management support: Copy of home glucose meter record, Written self-care plan, Education handout  (07/12/2009)   Diabetes care plan printed   Diabetes education handout printed   Last diabetes self-management training by diabetes educator: 01/28/2009    Hypertension self-management support: Written self-care plan, Education handout  (07/12/2009)   Hypertension self-care plan printed.   Hypertension education handout printed  Process Orders Check Orders Results:     [facility]: ABN not required for this insurance Tests Sent for  requisitioning (July 12, 2009 9:25 AM):     07/12/2009: [facility] -- T-Comprehensive Metabolic Panel [id number] (signed)     Laboratory Results   Blood Tests   Date/Time Received: July 12, 2009 9:01 AM  Date/Time Reported: Lenoria Farrier  July 12, 2009 9:01 AM   HGBA1C: 9.8%   (Normal Range: Non-Diabetic - 3-6%   Control Diabetic - 6-8%) CBG Random:: 263mg /dL

## 2010-05-31 NOTE — Assessment & Plan Note (Signed)
Summary: (ACUTE-WILSON)3 WEEK RECHECK PER YANG/CH   Vital Signs:  Patient profile:   40 year old female Height:      61 inches (154.94 cm) Weight:      206.9 pounds (94.05 kg) BMI:     39.23 O2 Sat:      98 % on Room air Temp:     97.8 degrees F (36.56 degrees C) oral Pulse rate:   103 / minute BP sitting:   131 / 93  (right arm)  Vitals Entered By: Hilda Blades Ditzler RN (February 09, 2009 10:44 AM)  O2 Flow:  Room air Is Patient Diabetic? Yes  Pain Assessment Patient in pain? no      Nutritional Status BMI of > 30 = obese Nutritional Status Detail appetite ok CBG Result 468  Have you ever been in a relationship where you felt threatened, hurt or afraid?denies   Does patient need assistance? Functional Status Self care Ambulation Normal Comments FU - needs tesat strips for Freestyle. ? sinus infection- yellow productive cough and odor in nose and drainage in throat.   Diabetic Foot Exam Foot Inspection Is there a history of a foot ulcer?              No Is there a foot ulcer now?              No Can the patient see the bottom of their feet?          Yes Are the shoes appropriate in style and fit?          Yes Is there swelling or an abnormal foot shape?          Yes Are the toenails long?                No Are the toenails thick?                No Are the toenails ingrown?              No Is there heavy callous build-up?              No Is there pain in the calf muscle (Intermittent claudication) when walking?    NoIs there a claw toe deformity?              No Is there elevated skin temperature?            No Is there limited ankle dorsiflexion?            No Is there foot or ankle muscle weakness?            No  Diabetic Foot Care Education Patient educated on appropriate care of diabetic feet.  Pulse Check          Right Foot          Left Foot Dorsalis Pedis:        normal            normal  Set Next Diabetic Foot Exam here: 02/09/2010   10-g (5.07)  Semmes-Weinstein Monofilament Test           Right Foot          Left Foot Test Control      normal         normal Site 1         normal          Site 4         normal  normal Site 5         normal         normal Site 6         normal         normal  Impression      normal         normal   Primary Care Provider:  Junius Finner  MD   History of Present Illness: Patient states that she has been taking her medications as prescribed and has not missed an insulin dose or metformin dose since the last visit. Glucometer interrogation shows blood sugars between 250-501 with average near 400. Patient states that her sinuses are bothering her. She feels congestion and there is an odor when the congestion drains. The drainage is yellow and when she blows her nose it is the same color. No fever, chills, nausea, vomiting or trouble breathing. Patient describes the odor as foul, but cannot elaborate on the smell - other than "it stinks". Patient states the sarcoidosis has not been bothering her - she has been without any flares since decreasing her prednisone from 40mg  to 30mg . Patient reports some cramping when she takes calcium-vitamin D and has stopped taking that. She also states that she has some swelling in her ankles that last for 2-3 weeks and then goes away for about that amount of time, then returns. This has been a problem for about a year. Denies chest pain, shortness of breath, trouble breathing at night, or chest pain. Worse when legs are dependent.   Depression History:      The patient denies a depressed mood most of the day and a diminished interest in her usual daily activities.         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   Head:  normocephalic and atraumatic.   Eyes:  vision grossly intact, pupils equal, pupils round, and pupils reactive to light.   Ears:  no external deformities.   Nose:  no external deformity, no nasal discharge, no sinus percussion tenderness, mucosal erythema, and mucosal edema.   Mouth:  pharynx pink and moist, no erythema, and no exudates.   Neck:  supple, full ROM, no masses, no thyromegaly, no thyroid nodules or tenderness, and no cervical lymphadenopathy.   Lungs:  normal respiratory effort, no intercostal retractions, no accessory muscle use, normal breath sounds, no crackles, and no wheezes.   Heart:  normal rate, regular rhythm, no murmur, no gallop, and no rub.   Abdomen:  soft, non-tender,  normal bowel sounds, no guarding, no rigidity, and no rebound tenderness.   Pulses:  R radial normal, R dorsalis pedis normal, L radial normal, and L dorsalis pedis normal.   Extremities:  2+ left pedal edema and 1+ right pedal edema, most pronounced in ankles.   Neurologic:  alert & oriented X3 and cranial nerves II-XII intact.   Skin:  turgor normal, color normal, and no rashes.   Cervical Nodes:  no anterior cervical adenopathy.   Psych:  Oriented X3, memory intact for  recent and remote, normally interactive, good eye contact, not anxious appearing, and not depressed appearing.    Diabetes Management Exam:    Foot Exam (with socks and/or shoes not present):       Sensory-Monofilament:          Left foot: normal          Right foot: normal   Impression & Recommendations:  Problem # 1:  DIABETES MELLITUS, TYPE II, UNCONTROLLED (ICD-250.02)  Will increase metformin to 1000 mg two times a day. Will see patient back in 2 weeks given extremely elevated A1c at last visit and continued elevation in CBGs (average of about 400 with own meter during last few weeks). Told patient to bring meds at next visit and encouraged compliance and frequent CBG checks.  Her updated medication list for this problem includes:    Metformin Hcl 1000 Mg Tabs (Metformin hcl) .Marland Kitchen... Take 1 tablet by mouth two times a day    Novolin N 100 Unit/ml Susp (Insulin isophane human) ..... Inject 20 units subcutaneously once a day.  Orders: T-Lipoprotein (LDL cholesterol)  [id number]) Ophthalmology Referral (Ophthalmology)  Problem # 2:  SARCOIDOSIS (ICD-135) Need follow up with Dr. Melvyn Novas in pulmonology for assistance in management of sarcoidosis - we need to taper prednisone if possible. [facility] agreed in their note in May that tapering steroids would be ideal if pulmonary agrees. Will decrease prednisone to 25 mg today (from 30mg  now, down from 40 mg a few weeks ago) and continue to taper as possible and with further recommendations from Pulmonology if patient keeps appointment. Will notify Dr. Melvyn Novas of visit today. Dexa Scan to be ordered and encouraged patient to have better compliance with calcium/vitamin D supplements to mitigate side effects of chronic steroid use.  Orders: Dexa scan (Dexa scan)  Problem # 3:  Sx of SINUS PAIN (ICD-478.19) More congestion than pain at this point. Will provide patient with script for decongestant (zyrtec 10 mg once daily). Will re-evaluate at  next visit and offer antibiotics vs. nasal steroid if not improved given clinical picture at the future appointment.  Problem # 4:  Preventive Health Care (ICD-V70.0) Mammogram appointment made at last visit. Will ensure that order is acted upon and appointment is scheduled. Will make appointment for diabetic eye exam again as patient missed her previous appointment in August.  Problem # 5:  HYPERTENSION (ICD-401.9) Borderline today. Not currently on medications. Discussed lifestyle changes with patient today and the possibility of future medication if BP uncontrolled. Patient agrees to lifestyle changes at this time and is willing to revisit medication use if it remains uncontrolled.  Complete Medication List: 1)  Prednisone 10 Mg Tabs (Prednisone) .... Take 2  tabs by mouth daily for now 2)  Metformin Hcl 1000 Mg Tabs (Metformin hcl) .... Take 1 tablet by mouth two times a day 3)  Albuterol 90 Mcg/act Aers (Albuterol) .... Inhale 2 puffs four times a day as needed 4)  Calcium  500/d 500-200 Mg-unit Tabs (Calcium carbonate-vitamin d) .... Take 1 tablet by mouth three times a day 5)  Omeprazole 20 Mg Cpdr (Omeprazole) .... Take 1 tablet by mouth two times a day 6)  Novolin N 100 Unit/ml Susp (Insulin isophane human) .... Inject 20 units subcutaneously once a day. 7)  Insulin Syringe/needle 28g X 1/2" 0.5 Ml Misc (Insulin syringe-needle u-100) .... Per instruction 8)  Freestyle Lite Test Strp (Glucose blood) .... Use to check blood sugar 3x daily 9)  Lancets Misc (Lancets) .... Use to check blood sugar three times daily 10)  Prednisone 5 Mg Tabs (Prednisone) .... Take 1 tablet by mouth once a day with 2 prednisone 10 mg tablets for a total dose of 25 mg daily for now 11)  Zyrtec Allergy 10 Mg Caps (Cetirizine hcl) .... Take 1 tablet by mouth once a day  Other Orders: Capillary Blood Glucose/CBG [id number])  Patient Instructions: 1)  Please schedule a follow-up appointment in 2 weeks. 2)  Check  your blood sugars regularly. 3)  Please bring all your medications at your next visit. Prescriptions: ZYRTEC ALLERGY 10 MG CAPS (CETIRIZINE HCL) Take 1 tablet by mouth once a day  #30 x 1   Entered and Authorized by:   Luvenia Starch MD   Signed by:   Luvenia Starch MD on 02/09/2009   Method used:   Print then Give to Patient   RxID:   [id number] PREDNISONE 5 MG TABS (PREDNISONE) Take 1 tablet by mouth once a day with 2 prednisone 10 mg tablets for a total dose of 25 mg daily for now  #30 x 1   Entered and Authorized by:   Luvenia Starch MD   Signed by:   Luvenia Starch MD on 02/09/2009   Method used:   Print then Give to Patient   RxID:   [id number] METFORMIN HCL 1000 MG TABS (METFORMIN HCL) Take 1 tablet by mouth two times a day  #60 x 1   Entered and Authorized by:   Luvenia Starch MD   Signed by:   Luvenia Starch MD on 02/09/2009   Method used:   Print then Give to Patient   RxID:   [id number]  Process Orders Check Orders Results:     [facility]: ABN not required for this insurance Tests Sent for requisitioning (February 09, 2009 2:03 PM):     02/09/2009: [facility] -- T-Lipoprotein (LDL cholesterol)  [phone] (signed)    Prevention & Chronic Care Immunizations   Influenza vaccine: Fluvax 3+  (04/08/2008)    Tetanus booster: Not documented    Pneumococcal vaccine: Not documented  Other Screening   Pap smear: NEGATIVE FOR INTRAEPITHELIAL LESIONS OR MALIGNANCY.  (03/11/2008)    Mammogram: Not documented   Mammogram action/deferral: Ordered  (01/21/2009)   Smoking status: current  (02/09/2009)   Smoking cessation counseling: yes  (02/09/2009)  Diabetes Mellitus   HgbA1C: >14.0  (01/21/2009)    Eye exam: Not documented   Diabetic eye exam action/deferral: Ophthalmology referral  (02/09/2009)    Foot exam: yes  (02/09/2009)   Foot exam action/deferral: Do today   High risk foot: Not documented   Foot care  education: Done  (02/09/2009)   Foot exam due: 02/09/2010    Urine microalbumin/creatinine ratio: 7.0  (03/11/2008)    Diabetes flowsheet reviewed?: Yes   Progress toward A1C goal: Unchanged  Lipids   Total Cholesterol: 278  (01/21/2009)   Lipid panel action/deferral: LDL Direct Ordered   LDL: *  mg/dL  (01/21/2009)   LDL Direct: Not documented   HDL: 56  (01/21/2009)   Triglycerides: 403  (01/21/2009)  Hypertension   Last Blood Pressure: 131 / 93  (02/09/2009)   Serum creatinine: 0.97  (01/21/2009)   Serum potassium 4.2  (01/21/2009)    Hypertension flowsheet reviewed?: Yes   Progress toward BP goal: Unchanged  Self-Management Support :   Personal Goals (by the next clinic visit) :     Personal A1C goal: 7  (02/09/2009)     Personal blood pressure goal: 130/80  (01/21/2009)   Patient will work on the following items until the next clinic visit to reach self-care goals:     Medications and monitoring: take my medicines every day, check my blood sugar  (02/09/2009)     Eating: drink diet soda or water instead of juice or soda, eat more vegetables, use fresh or frozen vegetables, eat foods that are low in salt, eat baked foods instead of fried foods  (02/09/2009)     Activity: take a 30 minute walk every day  (11/23/2008)    Diabetes self-management support: Written self-care plan  (02/09/2009)   Diabetes care plan printed   Last diabetes self-management training by diabetes educator: 01/28/2009    Hypertension self-management support: Written self-care plan  (02/09/2009)   Hypertension self-care plan printed.   Nursing Instructions: Give Flu vaccine today Refer for screening diabetic eye exam (see order) Diabetic foot exam today

## 2010-05-31 NOTE — Assessment & Plan Note (Signed)
Summary: 6 WEEKS/APC   PCP:  [facility]  Chief Complaint:  Pt here for rov.  She denies any complaints..  History of Present Illness: 40 year old black female with long-standing sarcoid which flared in June 2008 manifested by arthritis rash and lymph node swelling.  She was placed back on prednisone at that point it is tapered effectively than 10 mg one half pill every other day.  She denies any significant flare up of these symptoms or significant dyspnea or cough.  Pt denies any significant sore throat, nasal congestion or excess secretions, fever, chills, sweats, unintended wt loss, ex cp, orthopnea pnd or leg swelling.    Current Allergies: No known allergies       Vital Signs:  Patient Profile:   40 Years Old Female Weight:      219 pounds O2 Sat:      97 % Temp:     100.7 degrees F oral Pulse rate:   103 / minute BP sitting:   108 / 60  (left arm)  Vitals Entered By: Tilden Dome (April 16, 2007 11:24 AM) Oxygen therapy Room Air                 Physical Exam    Ambulatory black female morbidly obese but in no acute distress. Afeb with normal vital signs HEENT: nl dentition, turbinates, and orophanx. Nl external ear canals without cough reflex Neck without JVD/Nodes/TM Lungs clear to A and P bilaterally without cough on insp or exp maneuvers RRR no s3 or murmur or increase in P2 Abd soft and benign with nl excursion in the supine position. No bruits or organomegaly Ext warm without calf tenderness, cyanosis clubbing or edema Skin warm and dry without lesions       Impression & Recommendations:  Problem # 1:  PULMONARY SARCOIDOSIS (ICD-135)  There is no evidence of active sarcoid. I have therefore recommended that she tapered completely off of prednisone after the holidays.  If she layers, I recommended that she resume 10 mg per day and then taper back down to 10 mg one half every other day as before.  This patient needs significant help  with weight control.  We discussed this at length. Hopefully, maintain her off prednisone will help in this regard.  She is been marked referred back to the teaching service to come in for further follow-up.  Medications Added to Medication List This Visit: 1)  Prednisone 10 Mg Tabs (Prednisone) .... 1/2 tablets every other day 2)  Benicar Hct 40-12.5 Mg Tabs (Olmesartan medoxomil-hctz) .... 1/2 tablet once daily   Patient Instructions: 1)  Please schedule a follow-up appointment as needed. 2)  After first of year, take prednisone every 3rd day and then stop, looking for cough, rash or arthritis.   ]

## 2010-05-31 NOTE — Consult Note (Signed)
Summary: [facility] : Hepatology Clinic Notes  [facility] : Clinic Notes   Imported By: Bonner Puna 10/06/2008 14:58:55  _____________________________________________________________________  External Attachment:    Type:   Image     Comment:   External Document

## 2010-05-31 NOTE — Initial Assessments (Signed)
INTERNAL MEDICINE ADMISSION HISTORY AND PHYSICAL Attending: Dr. Lynnae January First Contact: Gwenlyn Found [phone] Second Contact: Dr. Ronny Flurry [phone] PCP: Dr. Sarita Haver  CC: SOB  HPI: This is a 40 year old [place] female with a history of sarcoidosis diagnosed in 2001, type 2 DM, and hypertension who presents with a progressive shortness of breath since her last hospitalization on June 4th, 2011.  Since that time, the patient initially felt that she was improving, however, soon after the hospitalization, she began having an increasingly difficult time walking from place to place in her home and feels that she gets short of breath with minor exertion.  This has been particularly noticeable to the patient over the last two weeks.  Notably, the patient has been on 5mg  of prednisone for her sarcoidosis throughout this time period, but endorses taking 40mg  doses last year that she felt controlled her respiratory symptoms better.  Over the last 6 months, the patient has also noted abdominal distention that comes and goes decreasing her appetite and making it more difficult for her to breath. She also states that over about two weeks ago after giving herself an insulin shot she noted a burning sensation and has subsequently developed a non-tender plaque on the under portion of the pannus on her right side.  Although the patient denies any chest pain, palpitations, or change in her bowel moments, she has experienced a long hx of night sweats, orthopnea and occasional PND, a well as a three week history of mild cough productive of clear sputum.    ALLERGIES: NKDA   PAST MEDICAL HISTORY: Sarcoidosis (Dr Wert)-w/ liver involvement per biopsy 12/09 QT prolongation Diabetes mellitus, type II Hypertension Heart catheterization 06-06-06 : No CAD, no RAS, normal EF Hx of preeclampsia Hx of scalp seborrheic dermatitis Abnormal LFT's:   Alk phos 713, TB 1.3, AST 52, ALT 18 in 11/09- Liver u/s and exam c/w HSM.  Hep B  serology neg but Hep C ab +, HIV neg.  Coags slightly prolonged and alb low.  Started                                             empirically on prednisone for GI sarcoid and tapering.  AMA and Hep C viral load neg.            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 METFORMIN HCL 1000 MG TABS (METFORMIN HCL) Take 1 tablet by mouth two times a day ALBUTEROL 90 MCG/ACT AERS (ALBUTEROL) Inhale 2 puffs four times a day as needed OMEPRAZOLE 20 MG CPDR (OMEPRAZOLE) Take 1 tablet by mouth two times a day NOVOLIN 70/30 70-30 % SUSP (INSULIN ISOPHANE & REGULAR) Inject 35 units subcutaneously two times a day INSULIN SYRINGE/NEEDLE 28G X 1/2" 0.5 ML MISC (INSULIN SYRINGE-NEEDLE U-100) per instruction FREESTYLE LITE TEST  STRP (GLUCOSE BLOOD) use to check blood sugar 3x daily LANCETS  MISC (LANCETS) use to check blood sugar three times daily TOPROL XL 25 MG XR24H-TAB (METOPROLOL SUCCINATE) Take 1 tablet by mouth once a day   SOCIAL HISTORY: She is single, two children that are healthy.  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  Smokes 1/2 ppd x 20 years now cut back to 1 pack Q3-4 days.  She denies illegal drugs and alcohol use.    FAMILY HISTORY Family History: Children-healthy Mom-Colon ca Father-MS  ROS: Negative as per HPI  VITALS: T: 98.0 P:111  BP:134/80  R:22  O2SAT:97%  ON:RA O2 , O2 Saturation dropped to 82% on RA with the when the patient walked a short distance.   PHYSICAL EXAM: General:  alert, well-developed, and cooperative to examination.   Head:  normocephalic and atraumatic.   Eyes:  vision grossly intact, pupils equal, pupils round, pupils reactive to light, no injection and anicteric.   Mouth:   pharynx pink and moist, no erythema, and no exudates.   Neck:  supple, full ROM, no thyromegaly, no JVD, and no carotid bruits.   Lungs:  mildly increased respiratory effort, no accessory muscle use, mild scattered inspiratory and expiratory wheezes without crackles.   Heart:  normal rate, regular rhythm, no murmur, no gallop, and no rub.   Abdomen:  soft, non-tender, normal bowel sounds, distended, no guarding, no rebound tenderness, no hepatomegaly, and no splenomegaly.  There was a 5/2 cm strip of induration on the patients right lower pannus without any overlying erythema or cellulites.  Msk:  no joint swelling, no joint warmth, and no redness over joints.   Pulses:  2+ DP/PT pulses bilaterally Extremities:  No cyanosis, clubbing,, 1+ pitting edema.  Neurologic:  alert & oriented X3, cranial nerves II-XII intact, strength normal in all extremities, sensation intact to light touch, and gait normal.   Skin:  turgor normal and no rashes.   Psych:  Oriented X3, memory intact for recent and remote, normally interactive, good eye contact, not anxious appearing, and not depressed  appearing.  LABS:   WBC                                      6.8               4.0-10.5         K/uL  RBC                                      4.35              3.87-5.11        MIL/uL  Hemoglobin (HGB)                         11.3       l      12.0-15.0        g/dL  Hematocrit (HCT)                         35.0       l      36.0-46.0        %  MCV                                      80.5              78.0-100.0       fL  MCH -  25.8       l      26.0-34.0        pg  MCHC                                     32.1              30.0-36.0        g/dL  RDW                                      17.5       h      11.5-15.5        %  Platelet Count (PLT)                     246               150-400          K/uL  Neutrophils, %                           60                43-77            %   Lymphocytes, %                           25                12-46            %  Monocytes, %                             11                3-12             %  Eosinophils, %                           4                 0-5              %  Basophils, %                             0                 0-1              %  Neutrophils, Absolute                    4.1               1.7-7.7          K/uL  Lymphocytes, Absolute                    1.7               0.7-4.0          K/uL  Monocytes, Absolute  0.7               0.1-1.0          K/uL  Eosinophils, Absolute                    0.3               0.0-0.7          K/uL  Basophils, Absolute                      0.0               0.0-0.1          K/uL   Sodium (NA)                              134        l      135-145          mEq/L  Potassium (K)                            4.1               3.5-5.1          mEq/L  Chloride                                 98                96-112           mEq/L  CO2                                      27                19-32            mEq/L  Glucose                                  99                70-99            mg/dL  BUN                                      10                6-23             mg/dL  Creatinine                               0.73              0.4-1.2          mg/dL  GFR, Est Non African American            >60               >60  mL/min  GFR, Est African American                >60               >60              mL/min    Oversized comment, see footnote  1  Calcium                                  9.3               8.4-10.5         mg/dL   Pregnancy, Urine-POC                     NEGATIVE  Color, Urine                             AMBER      a      YELLOW    BIOCHEMICALS MAY BE AFFECTED BY COLOR  Appearance                               CLOUDY     a      CLEAR  Specific Gravity                         1.024             1.005-1.030  pH                                        5.5               5.0-8.0  Urine Glucose                            NEGATIVE          NEG              mg/dL  Bilirubin                                SMALL      a      NEG  Ketones                                  NEGATIVE          NEG              mg/dL  Blood                                    LARGE      a      NEG  Protein                                  100        a  NEG              mg/dL  Urobilinogen                             1.0               0.0-1.0          mg/dL  Nitrite                                  NEGATIVE          NEG  Leukocytes                               SMALL      a      NEG  STUDIES:  Chest X-ray:  Findings: Cardiomegaly noted with indistinct opacity in the lingula   and left lower lobe which appears stable.  There is also right   basilar scarring which appears stable.  There is evidence of right   paratracheal and right hilar adenopathy with poor definition of the   AP window likely also due to adenopathy.    No new airspace opacity is identified.    IMPRESSION:    1.  Cardiomegaly.   2.  Mediastinal and hilar adenopathy.   3.  Stable bibasilar scarring and airspace opacities without new   opacity identified.  ASSESSMENT AND PLAN:  This is a 40 year old female with a past medical history significant for sarcoidosis who presents with SOB.   SOB- Considering pulmonary vs. cardiac  causes. SOB has been particularly progressive over last 2 weeks. Pt reports,  PND, orthopnea, and an inability to perform her activities of daily living without sob. While walking the patients O2 Saturation decreased to 82% with on RA here. The patient has inspiratory and expiratory wheezes improved with albuterol but does not have crackles nor recent sick contacts.  Although patient described a mild cough and night sweats,  atypical pneumonia is considered less likely at this time given the patient history of similar episodes that responded well to steroid  acceleration. At this point, the patient's SOB is likely secondary to sarcoidosis. Although pulmonary causes are most likely, the patient does have a history of ischemic cardiomyopathy with an EF of 45 % in 2010.  In the setting of orthopnea and PND, CHF  must be considered.  Although sarcoidosis can cause heart disease in itself, this typically result in arrhythmias.    Plan:   -Continue supplemental O2 2L by nasal canula.   -Albuterol as needed   -Patient received solumedrol in ED, will continue 40mg  prednisone daily from now.    - Will closely monitor clinically for response and consider azithromycin if patient's      condition worsens.   -Will recheck 2D echo and BMP to evaluate for cardiac etiology.   -Will check CBC/CMET in am a well as mag and phos.    Sarcoidosis:  Diagnosed in 2001 with liver involvement per biopsy 12/09.  Pt has been treated with prednisone in past did well with 40mg  once daily.  This was tapered down starting in January of this year.   Pt. last saw Dr. Leonides Schanz two years ago and has not followed up.  Now being seen in [place].  Will repeat ultrasound given  abdominal distention and induration, as well as to follow up on the patients liver involvement.  At this point the abdoinal induration is likly fat nicrosis, however, cutanious sarcoidosis is a possiblility.    Type II  DM -  Will begin home dose of metformin and Novolin 70/30 with sliding scale coverage.  Will do CBG, and anticipated hyperglycemia given acceleration of steroids.    Anemia: Likely due to chronic disease, however, we will get an anemia panel. Patient does not report dark stool or BRBPR.  Tobacco Abuse: Will provide patient with education regarding cessation as well as provide nicotine replacement.  Prophylaxis: Lovenox and Protonix  Deconditioning: likely due to sob, once improvement is seen, consider pt/ot while in house.    Attending Physician: I have seen and examined the patient. I reviewed the  resident/fellow note and agree with the findings and plan of care as documented. My additions and revisions are included.    Signature:  Date:

## 2010-05-31 NOTE — Assessment & Plan Note (Signed)
Summary: HEPATAMEGOLY/YF   History of Present Illness Visit Type: consult Primary GI MD: Erskine Emery MD [facility] Primary Provider: Junius Finner  MD Requesting Provider: Lucy Chris, MD Chief Complaint: Hepatamology/Liver biopsy History of Present Illness:   Melody Gray is a 40 year old Afro-American female referred at the request of Dr. Redmond Pulling for evaluation of hepatomegaly.  Abnormal liver tests were noted on routine testing in Nov elevation of the alkaline phosphatase to 358.  AST was normal and ALT was 50 bilirubin was normal, on April 08, 2008.  Her antimitochondrial antibody was negative as were serologies for hepatitis a D&C.  She was hepatitis C antibody positive though quantitative RNA was negative.  She has a history of sarcoidosis.  She has no history of hepatitis, pruritus, alcohol use or IV drug abuse.  A recent ultrasound demonstrated hepatosplenomegaly and a single large gallstone. She was started on prednisone 20 mg twice a day approximately month ago.    GI Review of Systems      Denies abdominal pain, acid reflux, belching, bloating, chest pain, dysphagia with liquids, dysphagia with solids, heartburn, loss of appetite, nausea, vomiting, vomiting blood, weight loss, and  weight gain.        Denies anal fissure, black tarry stools, change in bowel habit, constipation, diarrhea, diverticulosis, fecal incontinence, heme positive stool, hemorrhoids, irritable bowel syndrome, jaundice, light color stool, liver problems, rectal bleeding, and  rectal pain.     Prior Medications Reviewed Using: Medication Bottles  Updated Prior Medication List: ALBUTEROL 90 MCG/ACT AERS (ALBUTEROL) Inhale 2 puffs four times a day as needed PREDNISONE 20 MG TABS (PREDNISONE) Take two tablets by mouth once daily. METOPROLOL TARTRATE 25 MG TABS (METOPROLOL TARTRATE) Take 1 tablet by mouth two times a day CALCIUM 500/D 500-200 MG-UNIT TABS (CALCIUM CARBONATE-VITAMIN D) Take 1 tablet by mouth three  times a day PROTONIX 40 MG TBEC (PANTOPRAZOLE SODIUM) Take 1 tablet by mouth once a day  Current Allergies: No known allergies   Past Medical History:    Reviewed history from 04/08/2008 and no changes required:       Sarcoidosis (Dr Melvyn Novas)       QT prolongation       Diabetes mellitus, type II       Hypertension       Heart catheterization 06-06-06 : No CAD, no RAS, normal EF       Hx eclampsia       Hx of scalp seborrheic dermatitis       Abnormal LFT's:   Alk phos 713, TB 1.3, AST 52, ALT 18 in 11/09- Liver u/s and exam c/w HSM.  Hep B                             serology neg but Hep C ab +, HIV neg.  Coags slightly prolonged and alb low.  Started                             empirically on prednisone for GI sarcoid.  AMA and Hep C viral load pending.  Past Surgical History:    Reviewed history from 2020/06/2606 and no changes required:       Tubal ligation   Family History:    Reviewed history from 03/11/2008 and no changes required:       Children-healthy       Mom-Colon ca       [employer]  Review of Systems       The patient complains of cough, fatigue, menstrual pain, muscle pains/cramps, and urination - excessive.         Review of systems is otherwise negative    Vital Signs:  Patient Profile:   40 Years Old Female Height:     61 inches (154.94 cm) Weight:      194 pounds BMI:     36.79 Pulse rate:   100 / minute Pulse rhythm:   regular BP sitting:   110 / 72  (left arm)  Vitals Entered By: June McMurray [place] (April 22, 2008 3:33 PM)                  Physical Exam  She is a cushingoid-appearing female  There is no stigmata liver disease skin: anicteric HEENT: normocephalic; PEERLA; no nasal or pharyngeal abnormalities neck: supple nodes: no cervical lymphadenopathy chest: clear to ausculatation and percussion heart: no murmurs, gallops, or rubs abd: soft, nontender; BS normoactive; no abdominal masses, tenderness, organomegaly; abdomen is  obese rectal: deferred ext: no cynanosis, clubbing, edema skeletal: no deformities neuro: oriented x 3; no focal abnormalities     Impression & Recommendations:  Problem # 1:  HEPATOMEGALY (ICD-789.1) I 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  This would account for her abnormal, cholestatic liver tests and hepatomegaly.  Splenomegaly raises the question of cirrhosis  with  portal hypertension.  Recommendations #1 percutaneous liver biopsy #2 CT of the abdomen #3 reduce prednisone to 30 mg daily Orders: CT Abdomen/Pelvis w/o Contrast (CT ABD/Pel w/o con)    Patient Instructions: 1)  cc Dr. Junius Finner 2)  cc Dr. Sedonia Small    ]

## 2010-05-31 NOTE — Assessment & Plan Note (Signed)
Summary: diabetes teaching/cfb  MEDICAL NUTRITION THERAPY  Assessment: reports eating fast food often, has gained a lot of weight recently. wants to cook more at home, learn how to count calories for weight loss. She and physician set target weight loss of 5 # in 4 weeks. this will require 500 calorie deficit per day.   Wt: 239#     BMI: Medications:note Novolin 70/30 insulin twice daily 35 units, prednisone for sarcoidosis.  Blood sugars:all in the 100s recently per her record 24 hour recall:  fast food often, mac and cheese, will do recal at next visit Exercise: walks a lot Labs:Glucose 117 today Estimated needs: 1300-1500  calories/day  Diagnosis:  NB 1.1 -Food and nutrtion relatedknowledge derficit as related to becoming familiar with the amount fo caoas evidenced by her report and questions.  NB 1.4-self monitoring defecit as related to desire for weight loss as evidenced by her report and not keeping records of amounts of foods eaten.   Intervention:  1-educated about calorie counting using food label reading 2-Education about meal replacements for weight loss 3-Education about estimated calorie needs per day to decrease weight by 5 # in 4 weeks 4-Education about keeping a food record for calorie counting 5- need to follow up with hypoglycemia education and plan to discuss self adjustment fo insulin doses to promote weiught loss without sacrificing good blood sugar control  Monitoring:understanding of how to read label for portion size and calorie consumption, Evaluation:weight loss, decreased insulin doses  Follow-up:1 week  Allergies: No Known Drug Allergies   Complete Medication List: 1)  Prednisone 10 Mg Tabs (Prednisone) .... One tab daily 2)  Metformin Hcl 1000 Mg Tabs (Metformin hcl) .... Take 1 tablet by mouth two times a day 3)  Albuterol 90 Mcg/act Aers (Albuterol) .... Inhale 2 puffs four times a day as needed 4)  Calcium 500/d 500-200 Mg-unit Tabs (Calcium  carbonate-vitamin d) .... Take 1 tablet by mouth three times a day 5)  Omeprazole 20 Mg Cpdr (Omeprazole) .... Take 1 tablet by mouth two times a day 6)  Novolin 70/30 70-30 % Susp (Insulin isophane & regular) .... Inject 35 units subcutaneously two times a day 7)  Insulin Syringe/needle 28g X 1/2" 0.5 Ml Misc (Insulin syringe-needle u-100) .... Per instruction 8)  Freestyle Lite Test Strp (Glucose blood) .... Use to check blood sugar 3x daily 9)  Lancets Misc (Lancets) .... Use to check blood sugar three times daily 10)  Allegra 180 Mg Tabs (Fexofenadine hcl) .... Take 1 tablet by mouth once a day 11)  Nystatin Powd (Nystatin) .... Apply to affected area 2-3 times per day 12)  Chantix 1 Mg Tabs (Varenicline tartrate) .... Take 1 tablet by mouth once a day 13)  Accupril 40 Mg Tabs (Quinapril hcl) .... Take two tabs by mouth once a day. 14)  Furosemide 40 Mg Tabs (Furosemide) .... Take 1 tablet by mouth twice a day 15)  K-lor 20 Meq Pack (Potassium chloride) .... Take 1 tablet by mouth once a day 16)  Toprol Xl 25 Mg Xr24h-tab (Metoprolol succinate) .... Take 1 tablet by mouth once a day 17)  Glipizide Xl 5 Mg Xr24h-tab (Glipizide) .... Take 2 tablets by mouth once a day 18)  Zithromax 500 Mg Tabs (Azithromycin) .... Take 1 tablet by mouth once a day for five days 19)  Nystatin 100000 Unit/ml Susp (Nystatin) .Marland Kitchen.. 1 spoonful swish and swallow, four times a day  Other Orders: MNT/Initial Visit and Intervention, 15 minutes [id number])

## 2010-05-31 NOTE — Assessment & Plan Note (Signed)
Summary: Pulmonary/ ext ov with [facility] teaching and walking sats on 4= ok   Primary Provider/Referring Provider:  Junius Finner  MD  CC:  Followup sarcoid w/ PFTs.  Breathing is the same- no better or worse.Marland Kitchen  History of Present Illness: 40 yobf quit smoking July 2011 p dx of sarcoidosis in 2001 = sob, cough  became 02 dep  in July 2011 last off prednsione March 2011 but daily since then and complicated by [facility] detected   January 06, 2010 Pt states has been w/o Furosemide since  9/3  and is c/o increased SOB. Pt states outpt clinic advised her to follow up with Dr. Melvyn Novas for Sacoidosis. doe on 02 at 2 lpm sitting then 4 with activity gets off at the curb struggles with grocery store. Try taking Prednisone one half even days in am with breakfast Be sure you take omeprazole Take one 30-60 min before first and last meals of the day Stop nifedipine Start Cardizem 240 mg once daily Wear 24 hours per day, 2 lpm at rest and sleeping, 4lpm with activity, this is the best way to help your heart function better CT Chest ( repeated with contrast)> no PE, just sarcoid changes  February 09, 2010 Followup sarcoid w/ PFTs.  Breathing is the same- no better or worse.on 02 2 lpm w/in  and then outside using 4lpm>  cc doe x 50 ft still has to stop every aisle at [employer] but not [facility] parking.  Pt denies any significant sore throat, dysphagia, itching, sneezing,  nasal congestion or excess secretions,  fever, chills, sweats, unintended wt loss, pleuritic or exertional cp, hempoptysis, change in activity tolerance  orthopnea pnd or leg swelling Pt also denies any obvious fluctuation in symptoms with weather or environmental change or other alleviating or aggravating factors.       Current Medications (verified): 1)  Omeprazole 20 Mg Cpdr (Omeprazole) .... Take One 30-60 Min Before First and Last Meals of The Day 2)  Novolin 70/30 70-30 % Susp (Insulin Isophane & Regular) .... Inject 30 Units 30 Minutes Before  Breakfast and 30 Units 30 Minutes Before Evening Meal. 3)  Insulin Syringe/needle 28g X 1/2" 0.5 Ml Misc (Insulin Syringe-Needle U-100) .... Per Instruction 4)  Freestyle Lite Test  Strp (Glucose Blood) .... Use To Check Blood Sugar 3x Daily 5)  Lancets  Misc (Lancets) .... Use To Check Blood Sugar Three Times Daily 6)  Prednisone 20 Mg Tabs (Prednisone) .... Take 1/2 Tablet By Mouth Every Other Day 7)  Ferrous Sulfate 325 (65 Fe) Mg Tabs (Ferrous Sulfate) .... Take 1 Tablet By Mouth Two Times A Day 8)  Furosemide 40 Mg Tabs (Furosemide) .Marland Kitchen.. 1 Tablet By Mouth Daily 9)  Diltiazem Hcl Coated Beads 240 Mg Xr24h-Cap (Diltiazem Hcl Coated Beads) .... Take 1 Capsule By Mouth Once A Day 10)  Proventil Hfa 108 (90 Base) Mcg/act Aers (Albuterol Sulfate) .... 2 Puffs Every 4-6 Hrs As Needed 11)  Metformin Hcl 500 Mg Tabs (Metformin Hcl) .Marland Kitchen.. 1 Two Times A Day  Allergies (verified): No Known Drug Allergies  Past History:  Past Medical History: Sarcoidosis (Dr Danyel Tobey)-w/ liver involvement per biopsy 12/09 Unexplained Hypoxemia July 2011     - CT angiogram 01/07/10 >>> no PE      - PFT's February 09, 2010 FEV1  1.20 (49%) with 16% better p B2,  DLC0 33% > corrects to 84 QT prolongation Diabetes mellitus, type II Hypertension Heart catheterization 06-06-06 : No CAD, no RAS,  normal EF Hx eclampsia Hx of scalp seborrheic dermatitis Abnormal LFT's:   Alk phos 713, TB 1.3, AST 52, ALT 18 in 11/09- Liver u/s and exam c/w HSM.  Hep B                               serology neg but Hep C ab +, HIV neg.  Coags slightly prolonged and alb low.  Started                                             empirically on prednisone for GI sarcoid and tapering.  AMA and Hep C viral load neg.                 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 (neg complement, ANA, etc 12/10) Diabetic  retinopathy-R eye 02/11  Vital Signs:  Patient profile:   40 year old female Weight:      222 pounds O2 Sat:      95 % on 2 L/min Temp:     98.8 degrees F oral Pulse rate:   114 / minute BP sitting:   120 / 80  (left arm)  Vitals Entered By: Tilden Dome (February 09, 2010 11:13 AM)  O2 Flow:  2 L/min  Serial Vital Signs/Assessments:  Comments: 11:57 AM Ambulatory Pulse Oximetry  Resting; HR__53___    02 Sat__95% on 4 liters___  Lap1 (185 feet)   HR_125____   02 Sat__99% on 4 liters___ Lap2 (185 feet)   HR__132___   02 Sat__99% on 4 liters___    Lap3 (185 feet)   HR__137___   02 Sat__94% on 4 liters___  _x__Test Completed without Difficulty   sats stayed within normal range on 4 liters ___Test Stopped due to:  By: Francesca Jewett CMA    Physical Exam  Additional Exam:  She is a cushingoid-appearing female wt 234 > 238 January 06, 2010  > 222 February 09, 2010  There is no stigmata liver disease skin: anicteric HEENT: normocephalic; PEERLA; no nasal or pharyngeal abnormalities neck: supple nodes: no cervical lymphadenopathy chest: clear to ausculatation and percussion heart: no murmurs, gallops, or rubs abd: soft, nontender; BS normoactive; no abdominal masses, tenderness, organomegaly; abdomen is obese rectal: deferred ext: no cynanosis, clubbing, edema     Impression & Recommendations:  Problem # 1:  SARCOIDOSIS (ICD-135) Possible reversible airway component so start dulera trial  I spent extra time with the patient today explaining optimal mdi  technique.  This improved from  50-75% p coaching  Problem # 2:  RESPIRATORY FAILURE, ACUTE (ICD-518.81)  02 rx reviewed and adequate :  2lpm resting, 4lpm with activity  Orders: Est. Patient Level III [id number]  Medications Added to Medication List This Visit: 1)  Metformin Hcl 500 Mg Tabs (Metformin hcl) .Marland Kitchen.. 1 two times a day 2)  Dulera 200-5 Mcg/act Aero (Mometasone furo-formoterol fum) .... 2 puffs first  thing  in am and 2 puffs again in pm about 12 hours later 3)  Proventil Hfa 108 (90 Base) Mcg/act Aers (Albuterol sulfate) .... 2 puffs every 4-6 hrs as needed  Patient Instructions: 1)  Start Dulera 2 puffs first thing  in am and 2 puffs again in pm about 12 hours later 2)  Please schedule a follow-up appointment in 4 weeks, sooner if needed

## 2010-05-31 NOTE — Letter (Signed)
Summary: Generic Letter  [facility]  [address]   [place], [place] [address]   Phone: [phone]  Fax: [phone]    12/16/2009    Polo Riley, Manager of [employer] Fax [phone]  RE: [name]   To Whom It May Concern,  I am writing in regards to one of our clinic patients, [name] who is currently under my care.  Based on Ms. Partain's recent hospital admission and her multiple complicated medical problems, she cannot return to work.    From my physical examination on her last visit, 12/14/2009, she is not capable of working.  Should you have any further questions, please do not hesitate to contact me at [phone]. Sincerely,      Julius Bowels, MD

## 2010-05-31 NOTE — Initial Assessments (Signed)
INTERNAL MEDICINE ADMISSION HISTORY AND PHYSICAL  PCP: Dr. Junius Finner  CC: Nausea for 1 month  HPI:  Pt is a 40 yo female w/ past med hx below here for routine f/u.  She has been nauseated periodically for about 1 month.  It got worse yesterday and she actually vomitted yesterday, non bloody, and this morning x 1.  She has had abd'l pain w/ the vomiting. Nausea and vomitting seemed to be induced by food intake.  She had one episode of little diarrhea 4 days ago, non bloody.  Last BM on 09/28/09. She denies fevers/chills.  No known sick contacts.  She denies chest pain and SOB.  She has had some dizziness with this that occurs w/ standing for the last week.  She denies HA.  She has a little dysuria.  Of note, she ran out of prednisone last week and out of omeprazole 4 days ago. Resp symptoms aren't worse than usual.  She notes she has been out of the omeprazole for about 1 week.  Her sugars have been doing better since she has cut back on the prednisone.   She is currently menstrating.  ALLERGIES: NKDA   PAST MEDICAL HISTORY: Past Medical History: Sarcoidosis (Dr Wert)-w/ liver involvement per biopsy 12/09 QT prolongation Diabetes mellitus, type II Hypertension Heart catheterization 06-06-06 : No CAD, no RAS, normal EF Hx eclampsia Hx of scalp seborrheic dermatitis Abnormal LFT's:   Alk phos 713, TB 1.3, AST 52, ALT 18 in 11/09- Liver u/s and exam c/w HSM.  Hep B                               serology neg but Hep C ab +, HIV neg.  Coags slightly prolonged and alb low.  Started                                             empirically on prednisone for GI sarcoid and tapering.  AMA and Hep C viral load neg.                 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 METFORMIN HCL 1000 MG TABS (METFORMIN HCL) Take 1 tablet by mouth two times a day ALBUTEROL 90 MCG/ACT AERS (ALBUTEROL) Inhale 2 puffs four times a day as needed CALCIUM 500/D 500-200 MG-UNIT TABS (CALCIUM CARBONATE-VITAMIN D) Take 1 tablet by mouth three times a day OMEPRAZOLE 20 MG CPDR (OMEPRAZOLE) Take 1 tablet by mouth two times a day NOVOLIN 70/30 70-30 % SUSP (INSULIN ISOPHANE & REGULAR) Inject 35 units subcutaneously two times a day INSULIN SYRINGE/NEEDLE 28G X 1/2" 0.5 ML MISC (INSULIN SYRINGE-NEEDLE U-100) per instruction FREESTYLE LITE TEST  STRP (GLUCOSE BLOOD) use to check blood sugar 3x daily LANCETS  MISC (LANCETS) use to check blood sugar three times daily NYSTATIN  POWD (NYSTATIN) apply to affected area 2-3 times per day CHANTIX 1 MG TABS (VARENICLINE TARTRATE) Start 1/2 tab by mouth daily for three days then 1/2 tab twice daily for 4 days then increase to 1 full tab twice daily. ACCUPRIL 40 MG TABS (QUINAPRIL HCL) Take two tabs by  mouth once a day. FUROSEMIDE 40 MG TABS (FUROSEMIDE) Take 1 tablet by mouth twice a day K-LOR 20 MEQ PACK (POTASSIUM CHLORIDE) Take 1 tablet by mouth once a day TOPROL XL 25 MG XR24H-TAB (METOPROLOL SUCCINATE) Take 1 tablet by mouth once a day GLIPIZIDE XL 5 MG XR24H-TAB (GLIPIZIDE) Take 2 tablets by mouth once a day NYSTATIN 100000 UNIT/ML SUSP (NYSTATIN) 1 spoonful swish and swallow, four times a day PREDNISONE 1 MG TABS (PREDNISONE) Take as directed.   SOCIAL HISTORY: Social History: She is single, two children that are healthy.  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  Smokes 1/2 ppd x 20 years.  She denies illegal drugs and alcohol use.    FAMILY HISTORY Family History: Children-healthy Mom-Colon ca Father-MS  ROS: per HPI  VITALS:  Patient profile:   40 year old female Height:      61 inches (154.94 cm) Weight:      229.06 pounds (104.12 kg) BMI:     43.44 Temp:     97.8 degrees F (36.56 degrees C)  oral Pulse (ortho):   108 / minute BP sitting:   97 / 56  (left arm) BP standing:   88 / 58   Time      Position  BP       Pulse   1:54 PM   Lying LA  90/60    106                    1:55 PM   Sitting   96/64    107                   1:56 PM   Standing  88/58    108                      PHYSICAL EXAM:  Gen: Patient is in NAD, Pleasant. Eyes: PERRL, EOMI, No signs of anemia or jaundince. ENT: MMM, OP clear, No erythema, thrush or exudates. Neck: Supple, No carotid Bruits, No JVD, No thyromegaly Resp: CTA- Bilaterally, No W/C/R. CVS: S1S2 RRR, No M/R/G GI: Abdomen is soft. ND, NT, NG, NR, BS+. No organomegaly.       Rectal vault is empty, normal stool, no visible blood, Guaics ngetaive.  Ext: No pedal edema, cyanosis or clubbing. GU: No CVA tenderness. Skin: No visible rashes, scars. Lymph: No palpable lymphadenopathy. MS: Moving all 4 extremities. Neuro: A&O X3, CN II - XII are grossly intact. Motor strength is 5/5 in the all 4 extremities, Sensations intact to light touch, Gait normal, Cerebellar signs negative. Psych: Appropriate   General:  alert, pleasant, obese, no distress.  Eyes:  anicteric.  Mouth:  MMM, no thrush. Neck:  no LAD, no JVD.  Lungs:  normal respiratory effort, fair air mvt, occasional scattered crackles, no wheezing.  Heart:   tachy, reg rhythm, no m/r/g. Abdomen:  obese, soft, NT and ND.  Msk:  no joint effusion. Extremities:  no peripheral edema.  Neurologic:  gait normal.  LABS:   Tests: (1) CBC with Diff (10010)   WBC                       5.9 K/uL                    (4.0-10.5)   RBC  4.21 MIL/uL                 (3.87-5.11   Hemoglobin                12.2 g/dL                   (12.0-15.0   Hematocrit           [L]  35.9 %                      (36.0-46.0   MCV                       85.2 fL                     (78.0-100. ! MCH                       29.0 pg                     (26.0-34.0   MCHC                      34.1  g/dL                   (30.0-36.0   RDW                  [H]  15.9 %                      (11.5-15.5   Platelet Count            187 K/uL                    (150-400)   Granulocyte %        [L]  42 %                        (43-77)   Absolute Gran             2.5 K/uL                    (1.7-7.7)   Lymph %                   36 %                        (12-46)   Absolute Lymph            2.2 K/uL                    (0.7-4.0)   Mono %               [H]  17 %                        (3-12)   Absolute Mono             1.0 K/uL                    (0.1-1.0)   Eos %                     5 %                         (  0-5)   Absolute Eos              0.3 K/uL                    (0.0-0.7)   Baso %                    0 %                         (0-1)   Absolute Baso             0.0 K/uL                    (0.0-0.1)   WBC Morphology       RESULT: Criteria for review not met   RBC Morphology       RESULT: Criteria for review not met   Smear Review       RESULT: Criteria for review not met  Tests: (2) Comprehensive Metabolic Panel (123456)   Sodium                    137 mEq/L                   (135-145)   Potassium                 3.9 mEq/L                   (3.5-5.3)   Chloride                  99 mEq/L                    (96-112)   CO2                       29 mEq/L                    (19-32)   Glucose              [H]  142 mg/dL                   (70-99)   BUN                       17 mg/dL                    (6-23)   Creatinine           [H]  1.97 mg/dL                  (0.40-1.20   Bilirubin, Total          0.9 mg/dL                   (0.3-1.2)   Alkaline Phosphatase [H]  126 U/L                     (39-117)   AST/SGOT             [H]  40 U/L                      (0-37)   ALT/SGPT  25 U/L                      (0-35)   Total Protein             7.7 g/dL                    (6.0-8.3)   Albumin              [L]  3.2 g/dL                    (3.5-5.2)   Calcium                   9.7  mg/dL                   (8.4-10.5)  Tests: (3) Lipase [id number])   Lipase               [H]  83 U/L                      (11-59)  Tests: (4) Urinalysis Reflex (65000)   Color                [A]  RED                         (YELLOW)     Biochemicals may be affected by the color of the urine.   Appearance           [A]  TURBID                      (CLEAR)   Specific Gravity          1.021                       (1.005-1.0   pH                        5.0                         (5.0-8.0)   Glucose                   NEG mg/dL                   (NEG)   Bilirubin            [A]  LARGE                       (NEG)   Ketone               [A]  40 mg/dL                    (NEG)   Blood                [A]  LARGE                       (NEG)   Protein              [A]  > 300 mg/dL                 (NEG)   Urobilinogen  1 mg/dL                     (0.0-1.0)   Nitrite              [A]  POS                         (NEG)   Leukocyte Esterase        MODERATE                    (NEG)  Tests: (5) Urine Microscopic (65010)  Squamous Epithelial/ HPF                             FEW                         (RARE)   Crystals                  NONE SEEN                   (NEG)   Casts                     HYALINE                     (NEG)   WBC                  [A]  7-10 WBC/hpf                (<3)   RBC                  [A]  TNTC RBC/hpf                (<3)   Bacteria/ HPF        [A]  FEW                         (RARE)  HGBA1C: 8.0%   (Normal Range: Non-Diabetic - 3-6%   Control Diabetic - 6-8%) CBG Random:: 167mg /dL  ASSESSMENT AND PLAN:  (1)N/V due to*Gastroenteritis*Gastritis*PUD*Pancreatitis*Cholecystitis*UTI * Adrenal insufficiency PLAN:- NPO apart from meds- IVF- Stool WBC, O/P, C/S, Stool C.diff- Urine C+S- CBCD, BMP in AM- Liver/gallbladder U/S- Zofran 2 mg IV q 4 hr PRN N/V- Protonix 40 mg IV q 12 hrs (2)ARF, likely pre-renal secondary to volume contraction. Will check FeNA; IVF. (3)DM:  SSI, sensitive.CBG's three times a day ac and hs. (4)Orthostatic hypotension with a Hx of HTN. Will hold anti-HTN meds. (5)Sarcoidoses with EF of 45% per ECHO: restart Prednisone; Random Cortisol area. (6)VTE PROPH: SCD's  ATTENDING PHYSICIAN: I discussed the case with the resident(s) as noted ad reviewed the resident's notes. I agree with the finding and plan - please refer to the attending physician note for more details.  Signature__________________________________________  Printed Name_______________________________________

## 2010-05-31 NOTE — Miscellaneous (Signed)
Summary: H and P  INTERNAL MEDICINE ADMISSION HISTORY AND PHYSICAL First Contact: Doreene Burke [phone]) Second Contact: Pershing Cox [phone]) Attending: Rhea Pink  PCP: Redmond Pulling  CC: SOB  HPI:  Pt is a 40 yo F with Sarcoidosis, DM, and HTN who presents to ED today with SOB of about 2 weeks duration.  She reports that around that time she ran out of her steroids that she had been taking for her sarcoid.  Also around that time she reports that she began to notice an increase in swelling in her hands and feet.  She says that in the past this swelling was always attributed to her chronic steroid use.   SHe reports that over this 2 week time period she would feel short of breath worse with exertion but was also feeling it at rest.  She describes 3 pillow orthopnea as well as PND.  She denies any chest pain or palpitations.  She also denies any measured fevers but endorses night sweats.  She has not noticed any weight loss.  During these episodes she tried to use Albuterol which did not help.  ALLERGIES: NKDA  PAST MEDICAL HISTORY: Sarcoidosis (Dr Melvyn Novas) (2001) QT prolongation Diabetes mellitus, type II (2 months ago) Hypertension Heart catheterization 06-06-06 : No CAD, no RAS, normal EF Hx eclampsia (2001) Hx of scalp seborrheic dermatitis Abnormal LFT's:   Alk phos 713, TB 1.3, AST 52, ALT 18 in 11/09- Liver u/s and exam c/w HSM.  Hep B serology neg but Hep C ab +, HIV neg.  Coags slightly prolonged and alb low.  Started empirically on prednisone for GI sarcoid.  AMA and Hep C viral load neg.                 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   MEDICATIONS: PREDNISONE 10 MG TABS (PREDNISONE) Take 1 and a 1/2 tablets by  mouth once a day. METFORMIN HCL 1000 MG TABS (METFORMIN HCL) Take 1 tablet by mouth two times a day ALBUTEROL 90 MCG/ACT AERS (ALBUTEROL) Inhale 2 puffs four times a day as needed CALCIUM 500/D 500-200 MG-UNIT TABS (CALCIUM CARBONATE-VITAMIN D)  Take 1 tablet by mouth three times a day OMEPRAZOLE 20 MG CPDR (OMEPRAZOLE) Take 1 tablet by mouth two times a day NOVOLIN N 100 UNIT/ML SUSP (INSULIN ISOPHANE HUMAN) Inject 25 units twice daily. INSULIN SYRINGE/NEEDLE 28G X 1/2" 0.5 ML MISC (INSULIN SYRINGE-NEEDLE U-100) per instruction FREESTYLE LITE TEST  STRP (GLUCOSE BLOOD) use to check blood sugar 3x daily LANCETS  MISC (LANCETS) use to check blood sugar three times daily ZYRTEC ALLERGY 10 MG CAPS (CETIRIZINE HCL) Take 1 tablet by mouth once a day NYSTATIN  POWD (NYSTATIN) apply to affected area 2-3 times per day CHANTIX 1 MG TABS (VARENICLINE TARTRATE) Take 1 tablet by mouth once a day ACCUPRIL 10 MG TABS (QUINAPRIL HCL) Take 1 tablet by mouth once a day   SOCIAL HISTORY: She is single, two children that are healthy.  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  Smokes 1/2 ppd x 20 years.  She denies illegal drugs and alcohol use.     FAMILY HISTORY: Children-healthy Mom-Colon ca Father-MS, DM, HTN  ROS: as per HPI with the addition of dry looking rash on distal fingers of hands, worsened near vision  VITALS: T: 98.3 P: 113 BP: 144/87 R: 22 O2SAT: 93% ON: RA  PHYSICAL EXAM: General:  alert, well-developed, and cooperative to examination.   Head:  normocephalic and atraumatic.  Eyes:  vision grossly intact, pupils equal, pupils round, pupils reactive to light, no injection and anicteric.   Mouth:  pharynx pink and moist, no erythema, and no exudates.   Neck:  supple, full ROM, no thyromegaly, no JVD Lungs:  normal respiratory effort, no accessory muscle use, normal breath sounds, mild crackles (L>R), and occasional mild wheezes.  Heart:  normal rate, regular rhythm, no murmur, no gallop, and no rub.   Abdomen:  soft, obese, non-tender, normal bowel sounds, no distention, no guarding, no rebound tenderness, no hepatomegaly, and no splenomegaly.   Msk:  no joint swelling, no joint warmth, and no redness over joints.   Extremities:  No  cyanosis, clubbing, trace - 1+edema in bilateral lower extremities Neurologic:  alert & oriented X3, cranial nerves II-XII intact, strength normal in all extremities, sensation intact to light touch, and gait normal.   Skin:  turgor normal and mild dry appearing rash on distal fingers of right hand  Psych:  Oriented X3, memory intact for recent and remote, normally interactive, good eye contact, not anxious appearing, and not depressed appearing.  LABS: WBC                                      6.1               4.0-10.5         K/uL  RBC                                      4.03              3.87-5.11        MIL/uL  Hemoglobin (HGB)                         11.5       l      12.0-15.0        g/dL  Hematocrit (HCT)                         34.7       l      36.0-46.0        %  MCV                                      86.0              78.0-100.0       fL  MCHC                                     33.0              30.0-36.0        g/dL  RDW                                      16.8       h      11.5-15.5        %  Platelet Count (  PLT)                     266               150-400          K/uL  Neutrophils, %                           56                43-77            %  Lymphocytes, %                           33                12-46            %  Monocytes, %                             9                 3-12             %  Eosinophils, %                           2                 0-5              %  Basophils, %                             0                 0-1              %  Neutrophils, Absolute                    3.4               1.7-7.7          K/uL  Lymphocytes, Absolute                    2.0               0.7-4.0          K/uL  Monocytes, Absolute                      0.6               0.1-1.0          K/uL  Eosinophils, Absolute                    0.1               0.0-0.7          K/uL  Basophils, Absolute                      0.0               0.0-0.1          K/uL  Sodium (NA)  140               135-145          mEq/L  Potassium (K)                            4.3               3.5-5.1          mEq/L  Chloride                                 103               96-112           mEq/L  CO2                                      29                19-32            mEq/L  Glucose                                  154        h      70-99            mg/dL  BUN                                      12                6-23             mg/dL  Creatinine                               0.67              0.4-1.2          mg/dL  GFR, Est Non African American            >60               >60              mL/min  GFR, Est African American                >60               >60              mL/min    Oversized comment, see footnote  1  Calcium                                  9.4               8.4-10.5         mg/dL  CKMB, POC                                <1.0  l      1.0-8.0          ng/mL  Troponin I, POC                          <0.05             0.00-0.09        ng/mL  Myoglobin, POC                           36.0              12-200           ng/mL  D-Dimer, Fibrin Derivatives              3.34       h      0.00-0.48        ug/mL-FEU  Beta Natriuretic Peptide                 1061.0     h      0.0-100.0        pg/mL  IMAGING: CXR 2 View: IMPRESSION:   1.  Development of costophrenic angle and right lung airspace   disease and basilar predominant interstitial disease is suggestive   of progression of sarcoidosis. Infection or edema considered less   likely.  Hilar adenopathy.   2.  Basilar predominant pulmonary fibrosis also suggests   sarcoidosis.   3.    Cardiomegaly.  CTA Chest: IMPRESSION:   Negative for PE.  Mediastinal and bihilar adenopathy, impressive   but less marked in degree than noted on the 05/30/2006 CT exam.   Pulmonary arterial hypertension.  Splenomegaly.  Airspace disease   mainly in the right upper lobe - consider  asymmetrical edema versus   pneumonia versus alveolitis.  Pulmonary interstitial fibrosis,   bronchiectasis and paraseptal emphysema as noted previously.   Cirrhosis.  Splenomegaly.  ASSESSMENT AND PLAN: 1)  SOB- Given this patient's increased lower extremity edema, crackles on initial exam that improved with diuresis and elevated BNP (normal 6 months ago) the leading diagnosis is heart failure.  We will check a 2D Echo to further evaluate.  Pt had a normal EF on catheterization 2 yrs ago and no signs of CAD.  We will check an EKG as well but will not cycle enzymes as pt has no complaints of pain or pressure.  We will check daily weights, strict I and O's, salt restrict, and start on 40 mg lasix IV two times a day. Also on the differential is worsening pulmonary sarcoidosis.  This is supported by her recent cessation of use of steroids.  This seems less likely, but we will start her on 40mg  prednisone with plans for a slow taper.  We will increase her ACE-I to two times a day dosing as this is heart failure dosing.  PE was considered as a cause, but was effectively ruled out with CTA chest. 2) Edema- This pt's increase extremity edema may be 2/2 heart failure as described above.  Also may be due to nephrotic syndrome of which pt was recently given a diagnosis based on high microalbumin creatinine ratio.  To further work up this possibility and its underlying etiology we will check SPEP and UPEP, will check ANA and anti-dsDNA, and complement levels to look for collagen vascular disease causes.  Also given pt's h/o Hep C, we will check  cryoglobulins.  Will check a UA and urine microscopy as well.  This may all by DM related although this seems unlikely as her DM is a recent diagnosis and seems to be related to her steroid use ( ~6 months). 3) Sarcoidosis- increase steroids as described above. 4) DM- We will start her on 25 Units 70/30 two times a day as well as SSI and continue to monitor.  Hold  metformin. 5) GERD- continue PPI. 6) HTN- continue ACE-I.  Starting diuretics.  May be 2/2 chronic steroids. 7)VTE PROPH: lovenox  Attending Physician: I performed and/or observed a history and physical examination of the patient.  I discussed the case with the residents as noted and reviewed the residents' notes.  I agree with the findings and plan--please refer to the attending physician note for more details.  Signature  Printed Name

## 2010-05-31 NOTE — Assessment & Plan Note (Signed)
Summary: CHECKUP/ SB.   Vital Signs:  Patient Profile:   40 Years Old Female Height:     61 inches (154.94 cm) Weight:      184.2 pounds (83.73 kg) BMI:     34.93 Temp:     184.2 degrees F (84.56 degrees C) oral Pulse rate:   118 / minute BP sitting:   156 / 102  (right arm)  Pt. in pain?   no  Vitals Entered By: Nadine Counts Deborra Medina) (March 11, 2008 2:07 PM)              Is Patient Diabetic? Yes Did you bring your meter with you today? No Nutritional Status BMI of > 30 = obese CBG Result 110  Have you ever been in a relationship where you felt threatened, hurt or afraid?Unable to ask   Does patient need assistance? Functional Status Self care Ambulation Normal     PCP:  Junius Finner  MD  Chief Complaint:  Pap and routine f/u.  History of Present Illness: Pt is a 40 yo female w/ past medical history of  Sarcoidosis (Dr Melvyn Novas) QT prolongation Diabetes mellitus, type II Hypertension Heart catheterization 06-06-06 : No CAD, no RAS, normal EF Hx eclampsia Hx of scalp seborrheic dermatitis  here for a pap smear.  She has not seen a doctor in over a year.  She is not taking any medications except her albuterol inhaler but hasn't needed that in a long time.  She hasn't had any problems with SOB/cough.  She notes continued tobacco use.  She needs a pap smear.  LMP was 8 days ago and are regular.  She denies vaginal itching/burning.  She does note dysparunia starting 6 months ago and describes it as uncomfortable.  She has stopped having intercourse b/c of it.  She denies heavy periods.  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  She denies chest pain and SOB.  She also notes intermittent epiigastric and RUQ pain that has been ongoing for several months.  No nausea or vomitting.      Updated Prior Medication List: She has not been taking any medications  ALBUTEROL  90 MCG/ACT AERS (ALBUTEROL) Inhale 2 puffs four times a day as needed  Current Allergies (reviewed today): No known allergies   Past Medical History:    Reviewed history from 02/22/2007 and no changes required:       Sarcoidosis (Dr Melvyn Novas)       QT prolongation       Diabetes mellitus, type II       Hypertension       Heart catheterization 06-06-06 : No CAD, no RAS, normal EF       Hx eclampsia       Hx of scalp seborrheic dermatitis  Past Surgical History:    Reviewed history from 02/22/2007 and no changes required:       Tubal ligation   Family History:    Reviewed history and no changes required:       Children-healthy       Mom-Colon ca       [employer]  Social History:    Reviewed history and no changes required:       She is single, two children that are healthy.  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  She denies illegal drugs and etoh use.  Risk Factors:  Tobacco use:  current    Cigarettes:  Yes -- 1/3 pack(s) per day    Counseled to quit/cut down tobacco use:  yes   Review of Systems       As per HPI.   Physical Exam  General:     Alert, pleasant, no distress. Eyes:     PERRL, EOMI, muddied sclera but anicteric. Mouth:     MMM, fair dentition. Neck:     Supple, no LAD. Breasts:     No palpable masses.  Incision on R breast medial to nipple well healed.  Dense breast tissue noted.  No nipple discharge.  Areaoar tissue normal. Lungs:     Rare scattered crackles bilaterally, otherwise clear, normal respiratory effort.  Heart:     Tachy aroun 105, regular, no m/r/g.  Abdomen:     +BS's, soft, fullness/distension noted in the upper abdomen, TTP in the R UQ, epigastrium and mildly in the LUQ.  No rebound or guarding.   Rectal:     Very small external hemorrhoid in the 12 o'clock position.   Genitalia:     Normal external genitalia without lesions.  Cervix appears a bit friable without lesions.  Vaginal mucosa normal.  Pain with manual  exam.   Pulses:     2+ DP pulses. Extremities:     Trace pitting edema bilaterally.  Neurologic:     CN's II-XII intact.  Gait normal.   Cervical Nodes:     Small < 1cm LN that is very mobile noted in the L anterior cervical distribution.   Axillary Nodes:     No palpable lymphadenopathy Inguinal Nodes:     No palpable adenopathy Psych:     Mood euthymic.  Oriented x 3.    Impression & Recommendations:  Problem # 1:  ABDOMINAL DISTENSION (ICD-787.3) On exam, it looks like she may have significant hepatomegaly vs hepatosplenomegaly.  However, on exam the borders of the fullness were very difficult to differentiate.  Will check LFT's and get ultrasound to further evaluate.  Will also check CBC and HIV.  After reviewing her paper chart and hospital records, she has had elevated LFT's in the past and also noted an elevated GGT.  At this point, I am most concerned about possible hepatomegaly related to sarcoidosis.  Will f/u abdominal u/s/LFT's.  If she has significant hepatomegaly or liver dysfunction, she may need a GI consult and possible biopsy.    Orders: T-HIV Antibody  (Reflex) [id number]) T-Comprehensive Metabolic Panel ([id number]) T-Urine Pregnancy (in -house) [id number]) T-CBC w/Diff [id number]) Ultrasound (Ultrasound)   Problem # 2:  TACHYCARDIA (ICD-785.0) Pt is mildly tachy on exam.  Checked EKG and only significant finding is borderline prolonged QT and sinus tach w/ rate at 104.  She has had an ECHO almost two years ago that showed a preserved EF and diastolic dysfunction.  She also had a cath that was neg for CAD/RAS and had borderline LF fxn.  Given that she does have some mild peripheral edema and is tachycardic, will check 2D ECHO to evaluate systolic fxn, valves, etc.  At this pt, I'm concerned her sarcoid may also be affected her heart.  Will also check TSH/free T4 and magnesium.  T-TSH [phone]) T-T4, Free [phone]) T-Magnesium [phone]) 2 D Echo (2  D Echo) 12 Lead EKG (12 Lead EKG)  Orders: T-TSH [id number]) T-T4, Free [phone]) T-Magnesium [phone]) 2 D Echo (2 D Echo) 12 Lead EKG (12 Lead EKG)   Problem # 3:  LONG QT SYNDROME (ICD-794.31) Borderline prolonged QT seen on EKG today.  Has been prolonged in the past.  Will f/u mg level.  Problem # 4:  HYPERTENSION (ICD-401.9) BP elevated.  However, she has multiple problems going on today and is not taking any medications presently.  At this pt, I will check a TSH, urine microalb/cr ratio, and EKG.  Will bring her back in two weeks to re-evaluate ECHO.  She has had significant proteinuria in the past and if she cont to have proteinuria, will start ace inh.  She has a hx of diabetes documented, but it appears from the records that this was in the setting of steroid use and her a1c's have never been over 7 and her a1c is 5.0, so I dont think diabetes is an issue.  I would also like to check her K before starting a diuretic b/c she has been hypokalemic in the past.  The results of her ECHO, K, and urine microalb cr ratio will guid tx at her next visit.  Didn't want to start Ca channel blocker today b/c of lower extremity edema.    The following medications were removed from the medication list:    Benicar Hct 40-12.5 Mg Tabs (Olmesartan medoxomil-hctz) .Marland Kitchen... 1/2 tablet once daily  BP today: 156/102 Prior BP: 108/60 (04/16/2007)  Labs Reviewed: Creat: 0.73 (07/11/2006)   Problem # 5:  PERIPHERAL EDEMA (ICD-782.3) Feel like this is related to dependent edema from low albumin or possibly heart failure.  Will check albumin today and also checking 2D ECHO to look for causes since this will guide tx.   The following medications were removed from the medication list:    Benicar Hct 40-12.5 Mg Tabs (Olmesartan medoxomil-hctz) .Marland Kitchen... 1/2 tablet once daily   Problem # 6:  ROUTINE GYNECOLOGICAL EXAMINATION (ICD-V72.31) Did pelvic exam, pap, wet prep and GC/chlamydia.  She did have  some pain w/ the exam and has a hx of dysparunia.  Unsure the etiology.  Uterus did seem to be a bit full.  At this pt, will send of wet prep/gc/chalmydia looking for std's as the culprit.  Will also check hgb as fibroids could cause some pelvic discomfort.  Pending the results of these tests, will consider getting transvaginal u/s.  Orders: T-Wet Prep by [facility] [id number]) T- GC Chlamydia [phone]) T-Pap Smear, Thin Prep [id number])   Problem # 7:  DIABETES MELLITUS, TYPE II (ICD-250.00) This appears to have been diagnosed in the setting of steroid use.  A1c today is 5.0 off all meds, so do not think this is an active problem.  Will check urine microalb/cr ratio.  The following medications were removed from the medication list:    Benicar Hct 40-12.5 Mg Tabs (Olmesartan medoxomil-hctz) .Marland Kitchen... 1/2 tablet once daily  Orders: T-Hgb A1C (in-house) [id number]) T- Capillary Blood Glucose [id number]) T-Urine Microalbumin w/creat. ratio [phone] / [id number])  Labs Reviewed: HgBA1c: 5.0 (03/11/2008)   Creat: 0.73 (07/11/2006)      Problem # 8:  TOBACCO USE (ICD-305.1) Discussed cessation w/ her and deleterious effects this has on her health.  Plans to think about setting a quit date.   Complete Medication List: 1)  Albuterol 90 Mcg/act Aers (Albuterol) .... Inhale 2 puffs four times a day as needed   Patient Instructions: 1)  Please make a followup appointment with me in two weeks to go over all of your results. 2)  Please get all of your tests done. 3)  Please think about quitting smoking. 4)  We will call you with anything that is abnormal.   ] Laboratory Results   Blood Tests   Date/Time Received: March 11, 2008 2:18 PM  Date/Time Reported: Melvia Heaps  March 11, 2008 2:18 PM   HGBA1C: 5.0%   (Normal Range: Non-Diabetic - 3-6%   Control Diabetic - 6-8%) CBG Random:: 110mg /dL

## 2010-05-31 NOTE — Progress Notes (Signed)
Summary: diabetes support/dmr  Phone Note Outgoing Call   Call placed by: Barnabas Harries RD,CDE,  Aug 31, 2009 9:47 AM Summary of Call: called patient to follow-up on MNT/weight loss plan and blood sugars:she was at school, will try back again later

## 2010-05-31 NOTE — Assessment & Plan Note (Signed)
Summary: FU OV/WILSON/VS   Vital Signs:  Patient Profile:   40 Years Old Female Height:     61 inches (154.94 cm) Weight:      205.1 pounds (93.23 kg) BMI:     38.89 Temp:     97.9 degrees F (36.61 degrees C) oral Pulse rate:   106 / minute BP sitting:   145 / 89  (right arm)  Pt. in pain?   no  Vitals Entered By: Silverio Decamp NT II (June 12, 2008 3:41 PM)              Is Patient Diabetic? No Nutritional Status BMI of 25 - 29 = overweight  Have you ever been in a relationship where you felt threatened, hurt or afraid?No   Does patient need assistance? Functional Status Self care Ambulation Normal     PCP:  Junius Finner  MD  Chief Complaint:  FOLLOW-UP VISIT.  History of Present Illness: Pt is a 40 yo female w/ past medical history of Sarcoidosis,Diabetes mellitus, type II, Hypertension who comes in for routine follow-up. She still has isses with SOB and notes her breathing was much worse after stopping the prednisone. She has a chronic cough and cant seem to clear her secretions very well for relief.  She notes having to use her albuterol inhaler multiple times a day.  She notes that Dr. Deatra Ina restarted her prednisone recently but she still is not better at the current dose of 10 daily.   No hemoptysis, fevers, chills or other complaints.  She does note that she is having to use her reading glasses more often and feels like her vision has been getting worse over the last month.     Updated Prior Medication List: ALBUTEROL 90 MCG/ACT AERS (ALBUTEROL) Inhale 2 puffs four times a day as needed PREDNISONE 20 MG TABS (PREDNISONE) Take 1 tablet by mouth once a day CALCIUM 500/D 500-200 MG-UNIT TABS (CALCIUM CARBONATE-VITAMIN D) Take 1 tablet by mouth three times a day OMEPRAZOLE 20 MG CPDR (OMEPRAZOLE) Take 1 tablet by mouth two times a day FLONASE 50 MCG/ACT SUSP (FLUTICASONE PROPIONATE) 2 sprays in each nostril daily MUCINEX D [phone] MG XR12H-TAB  (PSEUDOEPHEDRINE-GUAIFENESIN) Take 1 tablet by mouth two times a day  Current Allergies: No known allergies   Past Medical History:    Reviewed history from 05/22/2008 and no changes required:       Sarcoidosis (Dr Melvyn Novas)       QT prolongation       Diabetes mellitus, type II       Hypertension       Heart catheterization 06-06-06 : No CAD, no RAS, normal EF       Hx eclampsia       Hx of scalp seborrheic dermatitis       Abnormal LFT's:   Alk phos 713, TB 1.3, AST 52, ALT 18 in 11/09- Liver u/s and exam c/w HSM.  Hep B                             serology neg but Hep C ab +, HIV neg.  Coags slightly prolonged and alb low.  Started                                           empirically on prednisone for GI  sarcoid.  AMA and Hep C viral load neg.                       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   Family History:    Reviewed history from 03/11/2008 and no changes required:       Children-healthy       Mom-Colon ca       Father-MS  Social History:    Reviewed history from 03/11/2008 and no changes required:       She is single, two children that are healthy.  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  She denies illegal drugs and etoh use.     Risk Factors: Tobacco use:  current    Year started:  25+ years    Cigarettes:  Yes -- 1/3 pack(s) per day Alcohol use:  no Exercise:  no Seatbelt use:  100 %  PAP Smear History:    Date of Last PAP Smear:  03/11/2008   Review of Systems      See HPI   Physical Exam  General:     alert, pleasant, no distress. Lungs:     normal respiratory effort, mild expiratory wheeze heard on the R, otherwise clear.  Heart:     normal rate, regular rhythm, no murmur, no gallop, and no rub.   Abdomen:     obese, soft, TTP in epigastrium without rebound or guarding. Pulses:     2+ DP pulses. Extremities:     no peripheral edema.  Neurologic:     alert & oriented X3.   Skin:     no rashes.     Psych:     mood euthymic.    Impression & Recommendations:  Problem # 1:  SARCOIDOSIS (ICD-135) I increased her prednisone to 20 daily as this controlled her symptoms in the past. She needs to follow-up with Dr. Melvyn Novas in the next few weeks. Continue with her albuterol as needed.   Problem # 2:  POSTNASAL DRIP (ICD-784.91) She also has quite a bit of PND so I recommended she use a nasal steroid spray and Mucinex DM for her persistent cough but for no more than 2 weeks.  Problem # 3:  GERD (ICD-530.81) Will continue with PPI.  Her updated medication list for this problem includes:    Omeprazole 20 Mg Cpdr (Omeprazole) .Marland Kitchen... Take 1 tablet by mouth two times a day   Complete Medication List: 1)  Albuterol 90 Mcg/act Aers (Albuterol) .... Inhale 2 puffs four times a day as needed 2)  Prednisone 20 Mg Tabs (Prednisone) .... Take 1 tablet by mouth once a day 3)  Calcium 500/d 500-200 Mg-unit Tabs (Calcium carbonate-vitamin d) .... Take 1 tablet by mouth three times a day 4)  Omeprazole 20 Mg Cpdr (Omeprazole) .... Take 1 tablet by mouth two times a day 5)  Flonase 50 Mcg/act Susp (Fluticasone propionate) .... 2 sprays in each nostril daily 6)  Mucinex D [phone] Mg Xr12h-tab (Pseudoephedrine-guaifenesin) .... Take 1 tablet by mouth two times a day   Patient Instructions: 1)  Please schedule a follow-up appointment in 1 month.   Prescriptions: OMEPRAZOLE 20 MG CPDR (OMEPRAZOLE) Take 1 tablet by mouth two times a day  #60 x 6   Entered and Authorized by:   Rhea Pink  DO   Signed by:   Rhea Pink  DO on 06/12/2008   Method used:   Print  then Give to Patient   RxID:   [id number] [facility] D [phone] MG XR12H-TAB (PSEUDOEPHEDRINE-GUAIFENESIN) Take 1 tablet by mouth two times a day  #20 x 3   Entered and Authorized by:   Rhea Pink  DO   Signed by:   Rhea Pink  DO on 06/12/2008   Method used:   Print then Give to Patient   RxID:   [id number] FLONASE 50 MCG/ACT SUSP  (FLUTICASONE PROPIONATE) 2 sprays in each nostril daily  #1 x 3   Entered and Authorized by:   Rhea Pink  DO   Signed by:   Rhea Pink  DO on 06/12/2008   Method used:   Print then Give to Patient   RxID:   [id number] ALBUTEROL 90 MCG/ACT AERS (ALBUTEROL) Inhale 2 puffs four times a day as needed  #1 x 11   Entered and Authorized by:   Rhea Pink  DO   Signed by:   Rhea Pink  DO on 06/12/2008   Method used:   Print then Give to Patient   RxID:   [id number] PROTONIX 40 MG TBEC (PANTOPRAZOLE SODIUM) Take 1 tablet by mouth once a day  #30 x 4   Entered and Authorized by:   Rhea Pink  DO   Signed by:   Rhea Pink  DO on 06/12/2008   Method used:   Print then Give to Patient   RxIDER:[id number] PREDNISONE 20 MG TABS (PREDNISONE) Take 1 tablet by mouth once a day  #30 x 3   Entered and Authorized by:   Rhea Pink  DO   Signed by:   Rhea Pink  DO on 06/12/2008   Method used:   Print then Give to Patient   RxID:   [id number]

## 2010-05-31 NOTE — Progress Notes (Signed)
Summary: MNT for weight loss follow-up/dmr  Phone Note Outgoing Call   Call placed by: Barnabas Harries RD,CDE,  Sep 24, 2009 5:04 PM Summary of Call: called again to follow-up on weight loss efforts/need for adjustment of insulin if intake decreased. patient reports intake decreased after change in prednisone dose, but she doesn;t think she has lost weight, CBGs in 100s an dno low blood sugars. We discussed setting SMART goals: small, measured acheivable , relavatn and timed goals: She would like to be able to walk, but cannot walk much without getting out of breath and this bothers her. would like to quit smoking but  having a hard time quitting- would like chantix- but hasn't been able obtain it from [facility]. Marland Kitchen  goal set today to be done by Tuesday 09/29/09 for her to  go to [facility] Tuesday morning to look into getting the chantix. Apopinbtment wiht RD/CDE scheduled for Tuesday at 2 PM to follow up.

## 2010-05-31 NOTE — Discharge Summary (Signed)
Summary: Hospital Discharge Update    Hospital Discharge Update:  Date of Admission: 11/18/2009 Date of Discharge: 11/20/2009  Brief Summary:  Patient was admitted to the hospital for SOB. This is likely due to sarcoidosis flare and she responds very well to prednisone. On discharge her SOB been significantly improved, but 89% O2Sat on ambulation, so we will discharge her to home with home oxygen at 1-2 L. She will have a follow-up with CCM Dr. Melvyn Novas for management of sarcoidosis and possible pulmonary rehab. We have increased her prednisone to 40 mg once daily and she will continue this untill see Dr. Melvyn Novas.  Labs needed at follow-up: CBC with differential, Comprehensive metabolic panel  Other follow-up issues:  She will have an appointment with Dr. Melvyn Novas on 12/20/2009 at 9:45AM for management of her sarcoidosis.  She will have an appointment with Dr. Silverio Decamp on 12/02/2009 at 1:30PM forher SOB abd ambulation O2Sat on RA.   Medication list changes:  Removed medication of ACCUPRIL 40 MG TABS (QUINAPRIL HCL) Take two tabs by mouth once a day. Removed medication of FUROSEMIDE 40 MG TABS (FUROSEMIDE) Take 1 tablet by mouth twice a day Removed medication of K-LOR 20 MEQ PACK (POTASSIUM CHLORIDE) Take 1 tablet by mouth once a day Removed medication of PREDNISONE 5 MG TABS (PREDNISONE) Take as directed. Added new medication of PREDNISONE 20 MG TABS (PREDNISONE) Take 2 tablets by mouth once a day untill you see your pulmonary doctor. - Signed Added new medication of FERROUS SULFATE 325 (65 FE) MG TABS (FERROUS SULFATE) .bidtab - Signed Changed medication from FERROUS SULFATE 325 (65 FE) MG TABS (FERROUS SULFATE) .bidtab to FERROUS SULFATE 325 (65 FE) MG TABS (FERROUS SULFATE) Take 1 tablet by mouth two times a day Rx of PREDNISONE 20 MG TABS (PREDNISONE) Take 2 tablets by mouth once a day untill you see your pulmonary doctor.;  #60 x 1;  Signed;  Entered by: Geanie Kenning MD;  Authorized by: Geanie Kenning  MD;  Method used: Faxed to [facility], [place], [place], [place]  [address], Ph: [phone], Fax: [phone] Rx of FERROUS SULFATE 325 (65 FE) MG TABS (FERROUS SULFATE) .bidtab;  #60 x 3;  Signed;  Entered by: Geanie Kenning MD;  Authorized by: Geanie Kenning MD;  Method used: Faxed to [facility], [place], [place], [place]  [address], Ph: [phone], Fax: [phone]  The medication, problem, and allergy lists have been updated.  Please see the dictated discharge summary for details.  Discharge medications:  METFORMIN HCL 1000 MG TABS (METFORMIN HCL) Take 1 tablet by mouth two times a day ALBUTEROL 90 MCG/ACT AERS (ALBUTEROL) Inhale 2 puffs four times a day as needed CALCIUM 500/D 500-200 MG-UNIT TABS (CALCIUM CARBONATE-VITAMIN D) Take 1 tablet by mouth three times a day OMEPRAZOLE 20 MG CPDR (OMEPRAZOLE) Take 1 tablet by mouth two times a day NOVOLIN 70/30 70-30 % SUSP (INSULIN ISOPHANE & REGULAR) Inject 35 units subcutaneously two times a day INSULIN SYRINGE/NEEDLE 28G X 1/2" 0.5 ML MISC (INSULIN SYRINGE-NEEDLE U-100) per instruction FREESTYLE LITE TEST  STRP (GLUCOSE BLOOD) use to check blood sugar 3x daily LANCETS  MISC (LANCETS) use to check blood sugar three times daily TOPROL XL 25 MG XR24H-TAB (METOPROLOL SUCCINATE) Take 1 tablet by mouth once a day PREDNISONE 20 MG TABS (PREDNISONE) Take 2 tablets by mouth once a day untill you see your pulmonary doctor. FERROUS SULFATE 325 (65 FE) MG TABS (FERROUS SULFATE) Take 1 tablet by mouth two  times a day  Other patient instructions:  You will have an appointment with Dr. Melvyn Novas on 12/20/2009 at 9:45AM. Phone: [phone]. you will have an appointment with Dr. Silverio Decamp on 12/02/2009 at 1:30PM. Phone: [phone]. Please quit your smoking.    Note: Hospital Discharge Medications & Other Instructions handout was printed, one copy for patient and a second copy to be placed in hospital chart.

## 2010-05-31 NOTE — Progress Notes (Signed)
Summary: HI cbg's/ hla  Phone Note From Pharmacy   Summary of Call: crystal at [facility] calls to say pt is there w/ cbg 468, refuses to be seen today due to her work schedule, missed last 2 appts, cbg's been running in 400-500 per pt. she c/o vision problems, tired and increased urination. pt scheduled tomorrow am w/ dr Donnella Bi, asked to come to appt fasting for 6 hrs for labs and if needed to go to er. Initial call taken by: Freddy Finner RN,  March 02, 2009 11:20 AM  Follow-up for Phone Call        no problem Follow-up by: Trinidad Curet MD,  March 02, 2009 11:44 AM

## 2010-05-31 NOTE — Assessment & Plan Note (Signed)
Summary: ACUTE/VEGA/RECHECK/CH   Vital Signs:  Patient profile:   40 year old female Height:      61 inches (154.94 cm) Weight:      235.8 pounds (107.18 kg) BMI:     44.72 O2 Sat:      93 % on Room air Temp:     97.4 degrees F (36.33 degrees C) oral Pulse rate:   111 / minute BP sitting:   147 / 95  (right arm)  Vitals Entered By: Hilda Blades Ditzler RN (December 07, 2009 9:48 AM)  O2 Flow:  Room air Is Patient Diabetic? Yes Did you bring your meter with you today? Yes Pain Assessment Patient in pain? no      Nutritional Status BMI of > 30 = obese Nutritional Status Detail appetite good CBG Result 100  Have you ever been in a relationship where you felt threatened, hurt or afraid?denies   Does patient need assistance? Functional Status Self care Ambulation Normal Comments Daughter with pt. Has home O2 with pt at 2L. FU - breathing sl easier. PSO2 at RA 96%.   Primary Care Provider:  Junius Finner  MD   History of Present Illness: 40 yo female with sarcoidosis presents today for follow up.  Patient reports feeling much better than last office visit on 12/02/09 and she is tolerating Lasix, Nifedipine, and steroid taper well. Tightness in her chest is gone but still has some swelling in abdomen and lower extremities. She continues to use 2 L of O2 at home but she can be on room air when she is in her bed because she is not moving around.  Sometimes she still need 4 L of O2 when she is up and moving around exerting herself.   Reports mild SOB when she walks. Denies any chestpain, N/V.  She has lost 15 pounds from the fluid.   Depression History:      The patient denies a depressed mood most of the day and a diminished interest in her usual daily activities.         Preventive Screening-Counseling & Management  Alcohol-Tobacco     Alcohol drinks/day: 0     Smoking Status: quit     Smoking Cessation Counseling: yes     Packs/Day: 1/3     Year Started: 25+  years  Caffeine-Diet-Exercise     Does Patient Exercise: yes     Type of exercise: walking     Times/week: 5  Allergies: No Known Drug Allergies  Review of Systems  The patient denies anorexia, fever, weight loss, weight gain, vision loss, decreased hearing, hoarseness, chest pain, syncope, dyspnea on exertion, peripheral edema, prolonged cough, headaches, hemoptysis, abdominal pain, melena, hematochezia, severe indigestion/heartburn, hematuria, incontinence, genital sores, muscle weakness, suspicious skin lesions, transient blindness, difficulty walking, depression, unusual weight change, abnormal bleeding, enlarged lymph nodes, angioedema, breast masses, and testicular masses.    Physical Exam  General:  alert, well-developed, well-nourished, and well-hydrated.   Lungs:  normal respiratory effort, no intercostal retractions, no accessory muscle use, normal breath sounds, no crackles, and no wheezes.   Heart:  normal rate, regular rhythm, no murmur, no gallop, no rub, and no JVD.   Abdomen:  Obese, soft, non-tender, normal bowel sounds, no distention, and no masses.  + 1pitting edema .on lower abdominal wall-improving from 12/02/09). Extremities:  1+ left pedal edema and 1+ right pedal edema.   Neurologic:  alert & oriented X3.     Impression & Recommendations:  Problem #  1:  SARCOIDOSIS (ICD-135) Assessment Improved Improving.  I would continue her tapering to 20mg  daily x7 days and then 10mg x10days until she sees Dr. Melvyn Novas. She is 96% on RA while waiting in examining room.  She does not need to be on continuous [place] and can  continue to use 2L [place] as needed at home.  She is scheduled to see Dr.Wert on 12/25/09.  Orders: T-Basic Metabolic Panel (99991111) Social Work Referral (Social )  Problem # 2:  EDEMA (W8805310.3) Assessment: Improved  Much improved.  Will continue her Lasix 40mg  daily, her Cr on 12/02/09 was 0.63 and will recheck her Cr today. Follow up with me in 1 wk.      Her updated medication list for this problem includes:    Furosemide 40 Mg Tabs (Furosemide) .Marland Kitchen... 1 tablet by mouth daily  Orders: T-Basic Metabolic Panel (99991111)  Problem # 3:  DIABETES MELLITUS, TYPE II, UNCONTROLLED (ICD-250.02) CBG 100 today. BS at home has been adequately controlled ranging from low 100s to mid 100s with a few that are in 300s.  Will continue current regimen. Her updated medication list for this problem includes:    Metformin Hcl 1000 Mg Tabs (Metformin hcl) .Marland Kitchen... Take 1 tablet by mouth two times a day    Novolin 70/30 70-30 % Susp (Insulin isophane & regular) ..... Inject 35 units subcutaneously two times a day  Orders: T-Basic Metabolic Panel (99991111)  Problem # 4:  HYPERTENSION (ICD-401.9) B today is 147/95.  Improved from last visit 160/103.  Will continue current meds for now and may have to increase dosage if pressure is still elevated. Her updated medication list for this problem includes:    Nifedipine 30 Mg Xr24h-tab (Nifedipine) .Marland Kitchen... 1 tablet by mouth daily    Furosemide 40 Mg Tabs (Furosemide) .Marland Kitchen... 1 tablet by mouth daily  Complete Medication List: 1)  Metformin Hcl 1000 Mg Tabs (Metformin hcl) .... Take 1 tablet by mouth two times a day 2)  Albuterol 90 Mcg/act Aers (Albuterol) .... Inhale 2 puffs four times a day as needed 3)  Calcium 500/d 500-200 Mg-unit Tabs (Calcium carbonate-vitamin d) .... Take 1 tablet by mouth three times a day 4)  Omeprazole 20 Mg Cpdr (Omeprazole) .... Take 1 tablet by mouth two times a day 5)  Novolin 70/30 70-30 % Susp (Insulin isophane & regular) .... Inject 35 units subcutaneously two times a day 6)  Insulin Syringe/needle 28g X 1/2" 0.5 Ml Misc (Insulin syringe-needle u-100) .... Per instruction 7)  Freestyle Lite Test Strp (Glucose blood) .... Use to check blood sugar 3x daily 8)  Lancets Misc (Lancets) .... Use to check blood sugar three times daily 9)  Prednisone 20 Mg Tabs (Prednisone) .... Take  2 tablets by mouth once a day untill you see your pulmonary doctor. 10)  Ferrous Sulfate 325 (65 Fe) Mg Tabs (Ferrous sulfate) .... Take 1 tablet by mouth two times a day 11)  Nifedipine 30 Mg Xr24h-tab (Nifedipine) .Marland Kitchen.. 1 tablet by mouth daily 12)  Furosemide 40 Mg Tabs (Furosemide) .Marland Kitchen.. 1 tablet by mouth daily  Other Orders: Capillary Blood Glucose/CBG [id number])  Patient Instructions: 1)  1. Continue Prednisone tapering 20mg  x 7 days, then 10mg  x 10 days until you see Dr. Melvyn Novas 2)  2. Use oxygen via nasal canula as needed when you are exerting yourself; otherwise, you can be on room-air 3)  3. Continue Lasix 40mg  daily to decrease your swelling 4)  4. Return to clinic for follow up in  7-10 days to see Dr. Silverio Decamp Process Orders Check Orders Results:     [facility]: ABN not required for this insurance Tests Sent for requisitioning (December 07, 2009 12:07 PM):     12/07/2009: [facility] -- T-Basic Metabolic Panel 0000000 (signed)     Prevention & Chronic Care Immunizations   Influenza vaccine: Fluvax 3+  (04/08/2008)   Influenza vaccine deferral: Deferred  (05/06/2009)   Influenza vaccine due: 12/30/2009    Tetanus booster: Not documented   Td booster deferral: Deferred  (05/06/2009)    Pneumococcal vaccine: Not documented   Pneumococcal vaccine deferral: Deferred  (10/12/2009)  Other Screening   Pap smear: NEGATIVE FOR INTRAEPITHELIAL LESIONS OR MALIGNANCY.  (03/11/2008)   Pap smear action/deferral: Deferred  (06/02/2009)    Mammogram: BI-RADS CATEGORY 2:  Benign finding(s).^MM DIGITAL DIAG LTD R  (04/20/2009)   Mammogram action/deferral: Ordered  (01/21/2009)   Smoking status: quit  (12/07/2009)  Diabetes Mellitus   HgbA1C: 8.0  (09/29/2009)    Eye exam: Mild non-proliferative diabetic retinopathy.  OD   (06/17/2009)   Diabetic eye exam action/deferral: Ophthalmology referral  (02/09/2009)   Eye exam due: 12/2009    Foot exam: yes   (03/03/2009)   Foot exam action/deferral: Do today   High risk foot: Not documented   Foot care education: Done  (02/09/2009)   Foot exam due: 02/09/2010    Urine microalbumin/creatinine ratio: 25.0  (08/12/2009)   Urine microalbumin action/deferral: Ordered    Diabetes flowsheet reviewed?: Yes   Progress toward A1C goal: Improved  Lipids   Total Cholesterol: 278  (01/21/2009)   Lipid panel action/deferral: LDL Direct Ordered   LDL: * mg/dL  (01/21/2009)   LDL Direct: 126  (02/09/2009)   HDL: 56  (01/21/2009)   Triglycerides: 403  (01/21/2009)   Lipid panel due: 01/07/2010  Hypertension   Last Blood Pressure: 147 / 95  (12/07/2009)   Serum creatinine: 0.69  (12/02/2009)   BMP action: Ordered   Serum potassium 4.3  (12/02/2009)    Hypertension flowsheet reviewed?: Yes   Progress toward BP goal: Improved  Self-Management Support :   Personal Goals (by the next clinic visit) :     Personal A1C goal: 7  (02/09/2009)     Personal blood pressure goal: 130/80  (01/21/2009)     Personal LDL goal: 100  (05/06/2009)    Patient will work on the following items until the next clinic visit to reach self-care goals:     Medications and monitoring: take my medicines every day, check my blood sugar, check my blood pressure, bring all of my medications to every visit, examine my feet every day  (12/07/2009)     Eating: drink diet soda or water instead of juice or soda, eat more vegetables, use fresh or frozen vegetables, eat foods that are low in salt, eat baked foods instead of fried foods, eat fruit for snacks and desserts, limit or avoid alcohol  (12/07/2009)     Activity: park at the far end of the parking lot  (12/07/2009)    Diabetes self-management support: Copy of home glucose meter record, Written self-care plan, Education handout, Resources for patients handout  (12/07/2009)   Diabetes care plan printed   Diabetes education handout printed   Last diabetes self-management training  by diabetes educator: 01/28/2009    Hypertension self-management support: Written self-care plan, Education handout, Resources for patients handout  (12/07/2009)   Hypertension self-care plan printed.   Hypertension education handout printed  Resource handout printed.

## 2010-05-31 NOTE — Assessment & Plan Note (Signed)
Summary: 2WK FU/PER DR Letrice Pollok/VS   Vital Signs:  Patient Profile:   40 Years Old Female Height:     61 inches (154.94 cm) Weight:      185.1 pounds (84.14 kg) BMI:     35.10 O2 Sat:      97 % O2 treatment:    Room Air Temp:     98.2 degrees F (36.78 degrees C) oral Pulse rate:   115 / minute BP sitting:   145 / 89  (right arm)  Pt. in pain?   no  Vitals Entered By: Morrison Old RN (March 23, 2008 1:43 PM)              Is Patient Diabetic? No Nutritional Status BMI of > 30 = obese  Have you ever been in a relationship where you felt threatened, hurt or afraid?No   Does patient need assistance? Functional Status Self care Ambulation Normal     PCP:  Junius Finner  MD  Chief Complaint:  2week f/u;here for test results.  History of Present Illness: Pt is a 40 yo female w/ past medical history of  Sarcoidosis (Dr Melvyn Novas) QT prolongation Diabetes mellitus, type II Hypertension Heart catheterization 06-06-06 : No CAD, no RAS, normal EF Hx eclampsia Hx of scalp seborrheic dermatitis   here for f/u of her labwork and u/s results.   She denies significant alcohol use.  Still w/ continued feelings of occasional abdominal pain/fullness, tired,  and occasional cough.  She notes that in the mornings when she wakes up, she coughs and her sputum is blood tinged.  She also has seen blood when she blows her nose.  She also notes her gums have been bleeding when she brushes her teeth.  This has been ongoing for several months.  She denies menorrhagia, fevers, night sweats, weight loss, and sick contacts.        Prior Medications Reviewed Using: Patient Recall  Prior Medication List:  ALBUTEROL 90 MCG/ACT AERS (ALBUTEROL) Inhale 2 puffs four times a day as needed   Current Allergies (reviewed today): No known allergies   Past Medical History:    Reviewed history from 03/30/202008 and no changes required:       Sarcoidosis (Dr Melvyn Novas)       QT prolongation       Diabetes  mellitus, type II       Hypertension       Heart catheterization 06-06-06 : No CAD, no RAS, normal EF       Hx eclampsia       Hx of scalp seborrheic dermatitis  Past Surgical History:    Reviewed history from 03/30/202008 and no changes required:       Tubal ligation   Family History:    Reviewed history from 03/11/2008 and no changes required:       Children-healthy       Mom-Colon ca       Father-MS  Social History:    Reviewed history from 03/11/2008 and no changes required:       She is single, two children that are healthy.  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  She denies illegal drugs and etoh use.     Risk Factors:  Tobacco use:  current    Year started:  25+ years    Cigarettes:  Yes -- 1/3 pack(s) per day    Counseled to quit/cut down tobacco use:  yes Alcohol use:  no Exercise:  no Seatbelt use:  100 %  PAP Smear History:    Date of Last PAP Smear:  03/11/2008   Review of Systems       As per HPI.    Physical Exam  General:     Alert, pleasant, no distress.  Head:     [place]/AT Eyes:     Anicteric, bilateral pterygium. Mouth:     MMM, fair dentition. Lungs:     Normal respiratory effort, chest expands symmetrically. Lungs are clear to auscultation, no crackles or wheezes. Heart:     Borderline tachycardic, regular rhythm, no m/r/g. Abdomen:     +BS's, distended, + HSM, mildly TTP in epigastrium and R and L UQ's without rebound or guarding.   Extremities:     Trace pitting edema.  Psych:     Mood euthymic.      Impression & Recommendations:  Problem # 1:  HEPATOMEGALY (C6110506.1) Pt had markedly abnormal LFT's at her last visit: Bilirubin, Total     [H]  1.3 mg/dL                   0.3-1.2 Alkaline Phosphatase [H]  713 U/L                     39-117 AST/SGOT             [H]  52 U/L                      0-37 ALT/SGPT                  18 U/L                      0-35 Total Protein        [H]  8.4 g/dL                     6.0-8.3 Albumin              [L]  2.3 g/dL                    3.5-5.2 These have been abnormal in the past but not to this degree.  I got LFT's and an u/s at her last visit b/c of hepatosplenomegaly noted on PE and u/s is consistent w/ significant hepatosplenomegaly.  At this point, I am most concerned about hepatic involvement of her sarcoidosis and will refer to GI b/c I think she may need a biopsy and EGD b/c of hepatosplenomegaly.  Will also check hepatitis serology.  HIV neg at last visit. Will also check coags given low albumin to look for synthetic function of the liver.  Will start 40 mg of prednisone daily for empiric tx of GI sarcoidosis and f/u labs.  Will see pt back in two weeks to make sure all of the above got done.  Will start Ca and Vit D and will start PPI at next visit if she is going to need prolonged steroid therapy.   Orders: T-PTT [id number]) T-Protime, Auto [phone]) T-Hepatitis B Surface Antigen [phone]) T-Hepatitis B Core Antibody [id number]) T-Hepatitis B Surface Antibody [id number]) T-Hepatitis C Antibody [id number]) Gastroenterology Referral (GI)   Problem # 2:  UNSPECIFIED ANEMIA (ICD-285.9) DDx likely AOCD vs Fe def given her menstrating hx.  Will check below studies and tx as indicated.  Orders: T-Vitamin B12 [phone]) T-Ferritin [id number]) T-Iron [phone]) T-Iron Binding Capacity (TIBC) ([id number])  T-Folic Acid; RBC (Q000111Q)   Problem # 3:  HEMOPTYSIS (ICD-786.3) DDx to include sarcoidosis, malignancy, TB, nosebleeds, etc.  Feel like this may be related to occasional posterior nosebleeds given hx.  However, could be any of the above.  At this pt, esp given she reports bleeding gums and has signficant liver dysfxn, will check coags.  Also plan to start therapy w/ steroids to tx sarcoidosis and if that contributing, may help w/ symptoms.  Will f/u in two weeks.  May need to go to pulm if symptoms recurring, consider CT  chest-will likely be abnormal given sarcoidosis, or placement of PPD.  HIV neg.    Problem # 4:  HYPERTENSION (ICD-401.9) Started metoprolol b/c of tachycardia.  Will f/u BP in two weeks and titrate up metoprolol as needed.    Her updated medication list for this problem includes:    Metoprolol Tartrate 25 Mg Tabs (Metoprolol tartrate) .Marland Kitchen... Take 1 tablet by mouth two times a day   Problem # 5:  Preventive Health Care (ICD-V70.0) Pap 11/09 WNL.    Complete Medication List: 1)  Albuterol 90 Mcg/act Aers (Albuterol) .... Inhale 2 puffs four times a day as needed 2)  Prednisone 20 Mg Tabs (Prednisone) .... Take two tablets by mouth once daily. 3)  Metoprolol Tartrate 25 Mg Tabs (Metoprolol tartrate) .... Take 1 tablet by mouth two times a day 4)  Calcium 500/d 500-200 Mg-unit Tabs (Calcium carbonate-vitamin d) .... Take 1 tablet by mouth three times a day   Patient Instructions: 1)  Please make a followup appointment in two weeks.  It is ok to overbook  my schedule. 2)  Please see the gastroenterologist. 3)  Please try to stop smoking. 4)  Please take your new medications. 5)  Please call sooner if you need anything. 6)  Please see Clarice Pole to help with getting insurance.     Prescriptions: CALCIUM 500/D 500-200 MG-UNIT TABS (CALCIUM CARBONATE-VITAMIN D) Take 1 tablet by mouth three times a day  #90 x 0   Entered and Authorized by:   Junius Finner  MD   Signed by:   Junius Finner  MD on 03/23/2008   Method used:   Print then Give to Patient   RxID:   [id number] METOPROLOL TARTRATE 25 MG TABS (METOPROLOL TARTRATE) Take 1 tablet by mouth two times a day  #60 x 0   Entered and Authorized by:   Junius Finner  MD   Signed by:   Junius Finner  MD on 03/23/2008   Method used:   Print then Give to Patient   [id number] PREDNISONE 20 MG TABS (PREDNISONE) Take two tablets by mouth once daily.  #60 x 0   Entered and Authorized by:   Junius Finner  MD   Signed by:    Junius Finner  MD on 03/23/2008   Method used:   Print then Give to Patient   [id number]  ]

## 2010-05-31 NOTE — Assessment & Plan Note (Signed)
Summary: 1WK F/U/EST/VS   Vital Signs:  Patient profile:   40 year old female Height:      61 inches Weight:      235.01 pounds BMI:     44.57 Temp:     98.5 degrees F oral Pulse rate:   82 / minute BP sitting:   129 / 79  (right arm)  Vitals Entered By: Sander Nephew RN (June 23, 2009 2:17 PM) CC: Depression Is Patient Diabetic? Yes Did you bring your meter with you today? No Pain Assessment Patient in pain? no      Nutritional Status BMI of > 30 = obese CBG Result 282  Have you ever been in a relationship where you felt threatened, hurt or afraid?No   Does patient need assistance? Functional Status Self care Ambulation Normal Comments Recheck   Primary Care Provider:  Junius Finner  MD  CC:  Depression.  History of Present Illness: Pt is a pleasant 40 yo female w/ extensive past med hx below here for f/u on several chronic conditions.  Last week she noted increased lower extremity edema and we increased her lasix and this has improved w/ the increase and application of TED hose.    She saw the opthamologist and told she had diabetic retinopathy in her R eye but did not think sarcoidosis was affecting the eye.   Depression History:      The patient denies a depressed mood most of the day and a diminished interest in her usual daily activities.         Preventive Screening-Counseling & Management  Alcohol-Tobacco     Alcohol drinks/day: 0     Smoking Status: current     Smoking Cessation Counseling: yes     Packs/Day: 1/3     Year Started: 25+ years  Current Medications (verified): 1)  Prednisone 10 Mg Tabs (Prednisone) .... Take 3 1/2  Tabs By Mouth Once Daily 2)  Metformin Hcl 1000 Mg Tabs (Metformin Hcl) .... Take 1 Tablet By Mouth Two Times A Day 3)  Albuterol 90 Mcg/act Aers (Albuterol) .... Inhale 2 Puffs Four Times A Day As Needed 4)  Calcium 500/d 500-200 Mg-Unit Tabs (Calcium Carbonate-Vitamin D) .... Take 1 Tablet By Mouth Three Times A Day 5)   Omeprazole 20 Mg Cpdr (Omeprazole) .... Take 1 Tablet By Mouth Two Times A Day 6)  Novolin 70/30 70-30 % Susp (Insulin Isophane & Regular) .... Inject 35 Units Subcutaneously Two Times A Day 7)  Insulin Syringe/needle 28g X 1/2" 0.5 Ml Misc (Insulin Syringe-Needle U-100) .... Per Instruction 8)  Freestyle Lite Test  Strp (Glucose Blood) .... Use To Check Blood Sugar 3x Daily 9)  Lancets  Misc (Lancets) .... Use To Check Blood Sugar Three Times Daily 10)  Zyrtec Allergy 10 Mg Caps (Cetirizine Hcl) .... Take 1 Tablet By Mouth Once A Day 11)  Nystatin  Powd (Nystatin) .... Apply To Affected Area 2-3 Times Per Day 12)  Chantix 1 Mg Tabs (Varenicline Tartrate) .... Take 1 Tablet By Mouth Once A Day 13)  Accupril 20 Mg Tabs (Quinapril Hcl) .... Take Two Pills Once Daily. 14)  Furosemide 40 Mg Tabs (Furosemide) .... Take 1 Tablet By Mouth Twice A Day 15)  K-Lor 20 Meq Pack (Potassium Chloride) .... Take 1 Tablet By Mouth Once A Day 16)  Toprol Xl 25 Mg Xr24h-Tab (Metoprolol Succinate) .... Take 1 Tablet By Mouth Once A Day 17)  Glipizide Xl 5 Mg Xr24h-Tab (Glipizide) .... Take 2  Tablets By Mouth Once A Day  Allergies: No Known Drug Allergies  Past History:  Past Surgical History: Last updated: 12-28-202008 Tubal ligation  Social History: Last updated: 04/06/2009 She is single, two children that are healthy.  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  Smokes 1/2 ppd x 20 years.  She denies illegal drugs and alcohol use.    Risk Factors: Smoking Status: current (06/23/2009) Packs/Day: 1/3 (06/23/2009)  Past Medical History: Sarcoidosis (Dr Melvyn Novas) QT prolongation Diabetes mellitus, type II Hypertension Heart catheterization 06-06-06 : No CAD, no RAS, normal EF Hx eclampsia Hx of scalp seborrheic dermatitis Abnormal LFT's:   Alk phos 713, TB 1.3, AST 52, ALT 18 in 11/09- Liver u/s and exam c/w HSM.  Hep B                             serology neg but Hep C ab +, HIV neg.  Coags slightly prolonged  and alb low.  Started                                           empirically on prednisone for GI sarcoid.  AMA and Hep C viral load neg.                 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 Children-healthy Mom-Colon ca [employer]  Social History: Reviewed history from 04/06/2009 and no changes required. She is single, two children that are healthy.  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  Smokes 1/2 ppd x 20 years.  She denies illegal drugs and alcohol use.    Review of Systems       As per HPI.   Physical Exam  General:  alert, pleasant, obese, no distress.  Eyes:  anicteric Lungs:  slightly increased effort, no wheezing, few scattered crackles. Heart:  RRR, no m/r/g Abdomen:  obese, soft, NT and ND Extremities:  1+ pitting edema to knees, TED hose intact.   Impression & Recommendations:  Problem # 1:  BLURRED VISION (ICD-368.8) Related to diabetic retinopathy per hx.  Will attempt to get those records.  Fortunately, sarcoidosis does not appear to have affected the eye.   Problem # 2:  NEPHROTIC SYNDROME (ICD-581.9) Peripheral edema improved. F/u BMET today after lasix dose increased.   Will push her ACE I to max dose today.    Problem # 3:  DIABETES MELLITUS, TYPE II, UNCONTROLLED (ICD-250.02) Hopefully will improve once tapering the steroids. Maximize ACE I.  Her updated medication list for this problem includes:    Metformin Hcl 1000 Mg Tabs (Metformin hcl) .Marland Kitchen... Take 1 tablet by mouth two times a day    Novolin 70/30 70-30 % Susp (Insulin isophane & regular) ..... Inject 35 units subcutaneously two times a day    Accupril 40 Mg Tabs (Quinapril hcl) .Marland Kitchen... Take two tabs by mouth once a day.    Glipizide Xl 5 Mg Xr24h-tab (Glipizide)  .Marland Kitchen... Take 2 tablets by mouth once a day  Problem # 4:  SARCOIDOSIS (ICD-135) Plan to taper steroids by 5 mg every two weeks w/ close f/u.  Will also monitor LFT's as we do this  given her hx of hepatic sarcoid.  Orders: T-Comprehensive Metabolic Panel ([id number])  Problem # 5:  CONGESTIVE HEART FAILURE (ICD-428.0) Plan on repeating 2D echo around 06/11. No evidence for decompensation.   Her updated medication list for this problem includes:    Accupril 40 Mg Tabs (Quinapril hcl) .Marland Kitchen... Take two tabs by mouth once a day.    Furosemide 40 Mg Tabs (Furosemide) .Marland Kitchen... Take 1 tablet by mouth twice a day    Toprol Xl 25 Mg Xr24h-tab (Metoprolol succinate) .Marland Kitchen... Take 1 tablet by mouth once a day  Problem # 6:  HYPERTENSION (ICD-401.9)  Improved.  Wll maximize ACE I dose b/c of nephrotic syndrome.  BMET today as lasix dose increased.  Her updated medication list for this problem includes:    Accupril 40 Mg Tabs (Quinapril hcl) .Marland Kitchen... Take two tabs by mouth once a day.    Furosemide 40 Mg Tabs (Furosemide) .Marland Kitchen... Take 1 tablet by mouth twice a day    Toprol Xl 25 Mg Xr24h-tab (Metoprolol succinate) .Marland Kitchen... Take 1 tablet by mouth once a day  BP today: 129/79 Prior BP: 139/80 (06/16/2009)  Labs Reviewed: K+: 4.1 (06/16/2009) Creat: : 0.83 (06/16/2009)   Chol: 278 (01/21/2009)   HDL: 56 (01/21/2009)   LDL: * mg/dL (01/21/2009)   TG: 403 (01/21/2009)  Complete Medication List: 1)  Prednisone 10 Mg Tabs (Prednisone) .... Take 3 1/2  tabs by mouth once daily 2)  Metformin Hcl 1000 Mg Tabs (Metformin hcl) .... Take 1 tablet by mouth two times a day 3)  Albuterol 90 Mcg/act Aers (Albuterol) .... Inhale 2 puffs four times a day as needed 4)  Calcium 500/d 500-200 Mg-unit Tabs (Calcium carbonate-vitamin d) .... Take 1 tablet by mouth three times a day 5)  Omeprazole 20 Mg Cpdr (Omeprazole) .... Take 1 tablet by mouth two times a day 6)  Novolin 70/30 70-30 % Susp (Insulin isophane & regular) ....  Inject 35 units subcutaneously two times a day 7)  Insulin Syringe/needle 28g X 1/2" 0.5 Ml Misc (Insulin syringe-needle u-100) .... Per instruction 8)  Freestyle Lite Test Strp (Glucose blood) .... Use to check blood sugar 3x daily 9)  Lancets Misc (Lancets) .... Use to check blood sugar three times daily 10)  Zyrtec Allergy 10 Mg Caps (Cetirizine hcl) .... Take 1 tablet by mouth once a day 11)  Nystatin Powd (Nystatin) .... Apply to affected area 2-3 times per day 12)  Chantix 1 Mg Tabs (Varenicline tartrate) .... Take 1 tablet by mouth once a day 13)  Accupril 40 Mg Tabs (Quinapril hcl) .... Take two tabs by mouth once a day. 14)  Furosemide 40 Mg Tabs (Furosemide) .... Take 1 tablet by mouth twice a day 15)  K-lor 20 Meq Pack (Potassium chloride) .... Take 1 tablet by mouth once a day 16)  Toprol Xl 25 Mg Xr24h-tab (Metoprolol succinate) .... Take 1 tablet by mouth once a day 17)  Glipizide Xl 5 Mg Xr24h-tab (Glipizide) .... Take 2 tablets by mouth once a day  Other Orders: Capillary Blood Glucose/CBG [id number])  Patient Instructions: 1)  Please make a followup appointment in 2 weeks for a checkup. 2)  Please decrease prednisone by 5 mg every two weeks. 3)  Decrease to 30 mg/day starting today. 4)  Please increase accumpril to 80 mg once daily.  Prescriptions: K-LOR 20 MEQ PACK (POTASSIUM CHLORIDE) Take 1 tablet by mouth once a day  #30 x 0   Entered and Authorized by:   Mateo Flow  Neoma Uhrich  MD   Signed by:   Junius Finner  MD on 06/23/2009   Method used:   Faxed to ...       [facility] (retail)       [place], [place]  [address]       Ph: [phone]       Fax: [phone]   RxID:   [id number] ACCUPRIL 40 MG TABS (QUINAPRIL HCL) Take two tabs by mouth once a day.  #60 x 1   Entered and Authorized by:   Junius Finner  MD   Signed by:   Junius Finner  MD on 06/23/2009   Method used:   Print then Give to Patient   RxID:    [id number]   Prevention & Chronic Care Immunizations   Influenza vaccine: Fluvax 3+  (04/08/2008)   Influenza vaccine deferral: Deferred  (05/06/2009)   Influenza vaccine due: 12/30/2009    Tetanus booster: Not documented   Td booster deferral: Deferred  (05/06/2009)    Pneumococcal vaccine: Not documented  Other Screening   Pap smear: NEGATIVE FOR INTRAEPITHELIAL LESIONS OR MALIGNANCY.  (03/11/2008)   Pap smear action/deferral: Deferred  (06/02/2009)    Mammogram: BI-RADS CATEGORY 2:  Benign finding(s).^MM DIGITAL DIAG LTD R  (04/20/2009)   Mammogram action/deferral: Ordered  (01/21/2009)   Smoking status: current  (06/23/2009)   Smoking cessation counseling: yes  (06/23/2009)  Diabetes Mellitus   HgbA1C: 13.1  (04/06/2009)    Eye exam: Not documented   Diabetic eye exam action/deferral: Ophthalmology referral  (02/09/2009)    Foot exam: yes  (03/03/2009)   Foot exam action/deferral: Do today   High risk foot: Not documented   Foot care education: Done  (02/09/2009)   Foot exam due: 02/09/2010    Urine microalbumin/creatinine ratio: 3417.1  (04/06/2009)   Urine microalbumin action/deferral: Ordered    Diabetes flowsheet reviewed?: Yes   Progress toward A1C goal: At goal  Lipids   Total Cholesterol: 278  (01/21/2009)   Lipid panel action/deferral: LDL Direct Ordered   LDL: * mg/dL  (01/21/2009)   LDL Direct: 126  (02/09/2009)   HDL: 56  (01/21/2009)   Triglycerides: 403  (01/21/2009)  Hypertension   Last Blood Pressure: 129 / 79  (06/23/2009)   Serum creatinine: 0.83  (06/16/2009)   BMP action: Ordered   Serum potassium 4.1  (06/16/2009) CMP ordered     Hypertension flowsheet reviewed?: Yes   Progress toward BP goal: At goal  Self-Management Support :   Personal Goals (by the next clinic visit) :     Personal A1C goal: 7  (02/09/2009)     Personal blood pressure goal: 130/80  (01/21/2009)     Personal LDL goal: 100  (05/06/2009)    Patient will  work on the following items until the next clinic visit to reach self-care goals:     Medications and monitoring: take my medicines every day, check my blood sugar, bring all of my medications to every visit, examine my feet every day  (06/23/2009)     Eating: drink diet soda or water instead of juice or soda, eat more vegetables, eat foods that are low in salt, eat baked foods instead of fried foods, eat fruit for snacks and desserts  (06/23/2009)     Activity: take a 30 minute walk every day  (06/23/2009)    Diabetes self-management support: Written self-care plan, Education handout  (06/23/2009)   Diabetes care  plan printed   Diabetes education handout printed   Last diabetes self-management training by diabetes educator: 01/28/2009    Hypertension self-management support: Education handout  (06/23/2009)   Hypertension education handout printed  Process Orders Check Orders Results:     [facility]: ABN not required for this insurance Tests Sent for requisitioning (June 24, 2009 6:47 AM):     06/23/2009: [facility] -- T-Comprehensive Metabolic Panel [id number] (signed)     Vital Signs:  Patient profile:   40 year old female Height:      61 inches Weight:      235.01 pounds BMI:     44.57 Temp:     98.5 degrees F oral Pulse rate:   82 / minute BP sitting:   129 / 79  (right arm)  Vitals Entered By: Sander Nephew RN (June 23, 2009 2:17 PM)

## 2010-05-31 NOTE — Assessment & Plan Note (Signed)
Summary: ACUTE-SINUS PROBLEMS/TONGUE IS ALSO WHITE/CFB([facility])   Vital Signs:  Patient profile:   40 year old female Height:      61 inches (154.94 cm) Weight:      235.3 pounds (106.37 kg) BMI:     44.62 Temp:     98.6 degrees F (37.00 degrees C) oral Pulse rate:   115 / minute BP sitting:   125 / 84  (right arm) Cuff size:   large  Vitals Entered By: Lucky Rathke NT II (July 19, 2009 1:44 PM) CC: SINUS PROBLEM /  TONGUE IS WHITE   /  MEDICATION REFILL Is Patient Diabetic? Yes Did you bring your meter with you today? No Pain Assessment Patient in pain? no      Nutritional Status BMI of < 19 = underweight  Have you ever been in a relationship where you felt threatened, hurt or afraid?No   Does patient need assistance? Functional Status Self care Ambulation Normal Comments MEDICATION REFILL /  TONGUE IS WHITE / SINUS PROBLEM   Primary Care Provider:  Junius Finner  MD  CC:  SINUS PROBLEM /  TONGUE IS WHITE   /  MEDICATION REFILL.  History of Present Illness: Melody Gray is a 40 year old Female with PMH/problems as outlined in the EMR, who presents to the [facility] with chief complaint(s) of:    -- sinus problem: started one week ago, has facial pain, usu. takes zyrtec, but not taking them because it makes her sleepy; feels feverish and has sweats at night time. Breathing is labored, but that is not new for her. Has cough and phlegms, often yellow. Yellow mucus coming out of nose too.  -- tongue is white, since friday.  -- needs med refills  Preventive Screening-Counseling & Management  Alcohol-Tobacco     Alcohol drinks/day: 0     Smoking Status: current     Smoking Cessation Counseling: yes     Packs/Day: 1/3     Year Started: 25+ years  Caffeine-Diet-Exercise     Does Patient Exercise: no  Current Medications (verified): 1)  Prednisone 10 Mg Tabs (Prednisone) .... Take 2  Tabs By Mouth Once Daily 2)  Metformin Hcl 1000 Mg Tabs (Metformin Hcl) .... Take 1  Tablet By Mouth Two Times A Day 3)  Albuterol 90 Mcg/act Aers (Albuterol) .... Inhale 2 Puffs Four Times A Day As Needed 4)  Calcium 500/d 500-200 Mg-Unit Tabs (Calcium Carbonate-Vitamin D) .... Take 1 Tablet By Mouth Three Times A Day 5)  Omeprazole 20 Mg Cpdr (Omeprazole) .... Take 1 Tablet By Mouth Two Times A Day 6)  Novolin 70/30 70-30 % Susp (Insulin Isophane & Regular) .... Inject 35 Units Subcutaneously Two Times A Day 7)  Insulin Syringe/needle 28g X 1/2" 0.5 Ml Misc (Insulin Syringe-Needle U-100) .... Per Instruction 8)  Freestyle Lite Test  Strp (Glucose Blood) .... Use To Check Blood Sugar 3x Daily 9)  Lancets  Misc (Lancets) .... Use To Check Blood Sugar Three Times Daily 10)  Zyrtec Allergy 10 Mg Caps (Cetirizine Hcl) .... Take 1 Tablet By Mouth Once A Day 11)  Nystatin  Powd (Nystatin) .... Apply To Affected Area 2-3 Times Per Day 12)  Chantix 1 Mg Tabs (Varenicline Tartrate) .... Take 1 Tablet By Mouth Once A Day 13)  Accupril 40 Mg Tabs (Quinapril Hcl) .... Take Two Tabs By Mouth Once A Day. 14)  Furosemide 40 Mg Tabs (Furosemide) .... Take 1 Tablet By Mouth Twice A Day 15)  K-Lor  20 Meq Pack (Potassium Chloride) .... Take 1 Tablet By Mouth Once A Day 16)  Toprol Xl 25 Mg Xr24h-Tab (Metoprolol Succinate) .... Take 1 Tablet By Mouth Once A Day 17)  Glipizide Xl 5 Mg Xr24h-Tab (Glipizide) .... Take 2 Tablets By Mouth Once A Day  Allergies (verified): No Known Drug Allergies  Past History:  Past Medical History: Last updated: 06/23/2009 Sarcoidosis (Dr Melvyn Novas) QT prolongation Diabetes mellitus, type II Hypertension Heart catheterization 06-06-06 : No CAD, no RAS, normal EF Hx eclampsia Hx of scalp seborrheic dermatitis Abnormal LFT's:   Alk phos 713, TB 1.3, AST 52, ALT 18 in 11/09- Liver u/s and exam c/w HSM.  Hep B                             serology neg but Hep C ab +, HIV neg.  Coags slightly prolonged and alb low.  Started                                            empirically on prednisone for GI sarcoid.  AMA and Hep C viral load neg.                 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  Smokes 1/2 ppd x 20 years.  She denies illegal drugs and alcohol use.    Risk Factors: Alcohol Use: 0 (07/19/2009) Exercise: no (07/19/2009)  Risk Factors: Smoking Status: current (07/19/2009) Packs/Day: 1/3 (07/19/2009)  Review of Systems       as per HPI  Physical Exam  General:  alert and overweight-appearing.   Head:  maxillary sinus tenderness + Eyes:  pupils round and pupils reactive to light.   Mouth:  pharynx pink and moist and no erythema.  + whitish exudate on tongue Neck:  supple.   Lungs:  normal respiratory effort.  occasional wheezes Heart:  normal rate and regular rhythm.   Abdomen:  soft and non-tender.   Pulses:  normal peripheral pulses  Extremities:  no cyanosis, clubbing or edema  Neurologic:  non focal Cervical Nodes:  none palpable   Impression & Recommendations:  Problem # 1:  SINUSITIS (ICD-473.9) Treat with empric abx. I will give her allegra to help with secretions. She will let us know if no improvement.   Her updated medication list for this problem includes:    Zithromax 500 Mg Tabs (Azithromycin) .Marland Kitchen... 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 Will treat with nystatin.   Problem # 3:  SARCOIDOSIS (ICD-135) continue with steroid  taper as outlined in Dr. Dois Davenport note.   Problem # 4:  DIABETES  MELLITUS, TYPE II, UNCONTROLLED (ICD-250.02) Data reviewed: A1c: 9.8 (07/12/2009 8:16:52 AM)  MICROALB/CR: 3417.1 (04/06/2009 6:27:00 PM) FOOT: yes (03/03/2009 9:54:18 AM) No changes today. CBG should improve with decrease in steroid.  Her updated medication list for this problem includes:  Metformin Hcl 1000 Mg Tabs (Metformin hcl) .Marland Kitchen... Take 1 tablet by mouth two times a day    Novolin 70/30 70-30 % Susp (Insulin isophane & regular) ..... Inject 35 units subcutaneously two times a day    Accupril 40 Mg Tabs (Quinapril hcl) .Marland Kitchen... Take two tabs by mouth once a day.    Glipizide Xl 5 Mg Xr24h-tab (Glipizide) .Marland Kitchen... Take 2 tablets by mouth once a day  Problem # 5:  HYPERTENSION (ICD-401.9) Well-controlled. Continue the current regimen.  Med refills done.   Her updated medication list for this problem includes:    Accupril 40 Mg Tabs (Quinapril hcl) .Marland Kitchen... Take two tabs by mouth once a day.    Furosemide 40 Mg Tabs (Furosemide) .Marland Kitchen... Take 1 tablet by mouth twice a day    Toprol Xl 25 Mg Xr24h-tab (Metoprolol succinate) .Marland Kitchen... Take 1 tablet by mouth once a day  Problem # 6:  CONGESTIVE HEART FAILURE (ICD-428.0) Stable, no changes today.  Her updated medication list for this problem includes:    Accupril 40 Mg Tabs (Quinapril hcl) .Marland Kitchen... Take two tabs by mouth once a day.    Furosemide 40 Mg Tabs (Furosemide) .Marland Kitchen... Take 1 tablet by mouth twice a day    Toprol Xl 25 Mg Xr24h-tab (Metoprolol succinate) .Marland Kitchen... Take 1 tablet by mouth once a day  Complete Medication List: 1)  Prednisone 10 Mg Tabs (Prednisone) .... One tab daily 2)  Metformin Hcl 1000 Mg Tabs (Metformin hcl) .... Take 1 tablet by mouth two times a day 3)  Albuterol 90 Mcg/act Aers (Albuterol) .... Inhale 2 puffs four times a day as needed 4)  Calcium 500/d 500-200 Mg-unit Tabs (Calcium carbonate-vitamin d) .... Take 1 tablet by mouth three times a day 5)  Omeprazole 20 Mg Cpdr (Omeprazole) .... Take 1 tablet by mouth two times a  day 6)  Novolin 70/30 70-30 % Susp (Insulin isophane & regular) .... Inject 35 units subcutaneously two times a day 7)  Insulin Syringe/needle 28g X 1/2" 0.5 Ml Misc (Insulin syringe-needle u-100) .... Per instruction 8)  Freestyle Lite Test Strp (Glucose blood) .... Use to check blood sugar 3x daily 9)  Lancets Misc (Lancets) .... Use to check blood sugar three times daily 10)  Allegra 180 Mg Tabs (Fexofenadine hcl) .... Take 1 tablet by mouth once a day 11)  Nystatin Powd (Nystatin) .... Apply to affected area 2-3 times per day 12)  Chantix 1 Mg Tabs (Varenicline tartrate) .... Take 1 tablet by mouth once a day 13)  Accupril 40 Mg Tabs (Quinapril hcl) .... Take two tabs by mouth once a day. 14)  Furosemide 40 Mg Tabs (Furosemide) .... Take 1 tablet by mouth twice a day 15)  K-lor 20 Meq Pack (Potassium chloride) .... Take 1 tablet by mouth once a day 16)  Toprol Xl 25 Mg Xr24h-tab (Metoprolol succinate) .... Take 1 tablet by mouth once a day 17)  Glipizide Xl 5 Mg Xr24h-tab (Glipizide) .... Take 2 tablets by mouth once a day 18)  Zithromax 500 Mg Tabs (Azithromycin) .... Take 1 tablet by mouth once a day for five days 19)  Nystatin 100000 Unit/ml Susp (Nystatin) .Marland Kitchen.. 1 spoonful swish and swallow, four times a day  Patient Instructions: 1)  Please keep up your appointment with Dr. Redmond Pulling. 2)  Do let us know if your problem worsens.   Prescriptions: ACCUPRIL 40 MG TABS (QUINAPRIL HCL) Take two tabs by mouth once a day.  #60 x 6  Entered and Authorized by:   Dawna Part MD   Signed by:   Dawna Part MD on 07/19/2009   Method used:   Print then Give to Patient   RxID:   [id number] NYSTATIN 100000 UNIT/ML SUSP (NYSTATIN) 1 spoonful swish and swallow, four times a day  #1 bottle x 1   Entered and Authorized by:   Dawna Part MD   Signed by:   Dawna Part MD on 07/19/2009   Method used:   Faxed to ...       [facility] (retail)       [place], [place]  [address]       Ph: [phone]       Fax: [phone]   RxID:   [id number] ALLEGRA 180 MG TABS (FEXOFENADINE HCL) Take 1 tablet by mouth once a day  #15 x 1   Entered and Authorized by:   Dawna Part MD   Signed by:   Dawna Part MD on 07/19/2009   Method used:   Faxed to ...       [facility] (retail)       [place], [place]  [address]       Ph: [phone]       Fax: [phone]   RxID:   [id number] OMEPRAZOLE 20 MG CPDR (OMEPRAZOLE) Take 1 tablet by mouth two times a day  #60 x 6   Entered and Authorized by:   Dawna Part MD   Signed by:   Dawna Part MD on 07/19/2009   Method used:   Faxed to ...       [facility] (retail)       [place], [place]  [address]       Ph: [phone]       Fax: [phone]   RxID:   [id number] PREDNISONE 10 MG TABS (PREDNISONE) One tab daily  #30 x 1   Entered and Authorized by:   Dawna Part MD   Signed by:   Dawna Part MD on 07/19/2009   Method used:   Faxed to ...       [facility] (retail)       [place], [place]  [address]       Ph: [phone]       Fax: [phone]   RxID:   [id number] ZITHROMAX 500 MG TABS (AZITHROMYCIN) Take 1 tablet by mouth once a day for five days  #5 x 0   Entered and Authorized by:   Dawna Part MD   Signed by:   Dawna Part MD on 07/19/2009   Method used:   Faxed to ...       [facility] (retail)       [place], [place]  [address]       Ph: [phone]       Fax: [phone]   RxID:   [id number] METFORMIN HCL 1000 MG TABS (METFORMIN HCL) Take 1 tablet by mouth two times a day  #60 x 6   Entered and Authorized by:   Dawna Part MD   Signed by:   Dawna Part MD on 07/19/2009   Method used:   Faxed to [facility]  [facility] (retail)       [place], [place]  [address]       Ph: [phone]       Fax: [phone]   RxID:   [id number] TOPROL XL 25 MG XR24H-TAB (METOPROLOL SUCCINATE) Take 1 tablet by mouth once a day  #30 x 2   Entered and Authorized by:   Dawna Part MD   Signed by:   Dawna Part MD on 07/19/2009   Method used:   Faxed to ...       [facility] (retail)       [place], [place]  [address]       Ph: [phone]       Fax: [phone]   RxID:   [id number] K-LOR 20 MEQ PACK (POTASSIUM CHLORIDE) Take 1 tablet by mouth once a day  #30 x 0   Entered and Authorized by:   Dawna Part MD   Signed by:   Dawna Part MD on 07/19/2009   Method used:   Faxed to ...       [facility] (retail)       [place], [place]  [address]       Ph: [phone]       Fax: [phone]   RxID:   [id number] FUROSEMIDE 40 MG TABS (FUROSEMIDE) Take 1 tablet by mouth twice a day  #60 x 0   Entered and Authorized by:   Dawna Part MD   Signed by:   Dawna Part MD on 07/19/2009   Method used:   Faxed to ...       [facility] (retail)       [address] [place], [place]  [address]       Ph: [phone]       Fax: [phone]   RxID:   [id number]   Prevention & Chronic Care Immunizations   Influenza vaccine: Fluvax 3+  (04/08/2008)   Influenza vaccine deferral: Deferred  (05/06/2009)   Influenza vaccine due: 12/30/2009    Tetanus booster: Not documented   Td booster deferral: Deferred  (05/06/2009)    Pneumococcal vaccine: Not documented  Other Screening   Pap smear: NEGATIVE FOR INTRAEPITHELIAL LESIONS OR MALIGNANCY.  (03/11/2008)   Pap smear action/deferral: Deferred  (06/02/2009)    Mammogram: BI-RADS CATEGORY 2:  Benign finding(s).^MM DIGITAL DIAG LTD R  (04/20/2009)   Mammogram action/deferral: Ordered  (01/21/2009)   Smoking status: current  (07/19/2009)    Smoking cessation counseling: yes  (07/19/2009)  Diabetes Mellitus   HgbA1C: 9.8  (07/12/2009)    Eye exam: Not documented   Diabetic eye exam action/deferral: Ophthalmology referral  (02/09/2009)    Foot exam: yes  (03/03/2009)   Foot exam action/deferral: Do today   High risk foot: Not documented   Foot care education: Done  (02/09/2009)   Foot exam due: 02/09/2010    Urine microalbumin/creatinine ratio: 3417.1  (04/06/2009)   Urine microalbumin action/deferral: Ordered    Diabetes flowsheet reviewed?: Yes   Progress toward A1C goal: Unchanged  Lipids   Total Cholesterol: 278  (01/21/2009)   Lipid panel action/deferral: LDL Direct Ordered   LDL: * mg/dL  (01/21/2009)   LDL Direct: 126  (02/09/2009)   HDL: 56  (01/21/2009)   Triglycerides: 403  (  01/21/2009)  Hypertension   Last Blood Pressure: 125 / 84  (07/19/2009)   Serum creatinine: 0.92  (07/12/2009)   BMP action: Ordered   Serum potassium 4.3  (07/12/2009)    Hypertension flowsheet reviewed?: Yes   Progress toward BP goal: At goal  Self-Management Support :   Personal Goals (by the next clinic visit) :     Personal A1C goal: 7  (02/09/2009)     Personal blood pressure goal: 130/80  (01/21/2009)     Personal LDL goal: 100  (05/06/2009)    Patient will work on the following items until the next clinic visit to reach self-care goals:     Medications and monitoring: take my medicines every day, check my blood sugar, bring all of my medications to every visit, examine my feet every day  (07/19/2009)     Eating: drink diet soda or water instead of juice or soda, eat more vegetables, use fresh or frozen vegetables, eat foods that are low in salt, eat baked foods instead of fried foods, eat fruit for snacks and desserts, limit or avoid alcohol  (07/19/2009)     Activity: park at the far end of the parking lot  (07/19/2009)    Diabetes self-management support: Resources for patients handout  (07/19/2009)   Last diabetes  self-management training by diabetes educator: 01/28/2009    Hypertension self-management support: Resources for patients handout  (07/19/2009)      Resource handout printed.

## 2010-05-31 NOTE — Assessment & Plan Note (Signed)
Summary: 1 month f/u/cfb  1st contact: Dr. Stanford Scotland: [phone] 2nd contact: Dr. Eyvonne Mechanic: [phone]  Nights/weekends:  1st contact: [phone] 2nd contact: [phone]  Vital Signs:  Patient profile:   40 year old female Height:      61 inches (154.94 cm) Weight:      229.06 pounds (104.12 kg) BMI:     43.44 Temp:     97.8 degrees F (36.56 degrees C) oral Pulse (ortho):   108 / minute BP sitting:   97 / 56  (left arm) BP standing:   88 / 58  Vitals Entered By: Sander Nephew RN (September 29, 2009 1:35 PM) Is Patient Diabetic? Yes Did you bring your meter with you today? No Pain Assessment Patient in pain? no      Nutritional Status BMI of > 30 = obese CBG Result 167  Have you ever been in a relationship where you felt threatened, hurt or afraid?No   Does patient need assistance? Functional Status Self care Ambulation Normal Comments problems eating, N/V started yesterday, drinking fluids, taking meds, some abdominal pain, needs refills    Primary Care Provider:  Junius Finner  MD   History of Present Illness: Pt is a 40 yo female w/ past med hx below here for routine f/u.  She has been nauseated periodically for about 1 month.  It got worse yesterday and she actually vomitted yesterday, non bloody, and this morning.  She has had abd'l pain w/ the vomiting.  She had a little diarrhea, non bloody.  No fevers/chills.  No known sick contacts.  She denies chest pain and SOB.  She has had some dizziness with this that occurs w/ standing for the last week.  She denies HA.  She has a little dysuria.  Of note, she ran out of prednisone last week.  Resp symptoms aren't worse than usual.  She notes she has been out of the omeprazole for about 1 week.  Her sugars have been doing better since she has cut back on the prednisone.   She is currently menstrating.   Depression History:      The patient denies a depressed mood most of the day and a diminished interest in her usual daily activities.          Preventive Screening-Counseling & Management  Alcohol-Tobacco     Alcohol drinks/day: 0     Smoking Status: current     Smoking Cessation Counseling: yes     Packs/Day: 1/3     Year Started: 25+ years  Current Medications (verified): 1)  Prednisone 5 Mg Tabs (Prednisone) .... Take As Directed. 2)  Metformin Hcl 1000 Mg Tabs (Metformin Hcl) .... Take 1 Tablet By Mouth Two Times A Day 3)  Albuterol 90 Mcg/act Aers (Albuterol) .... Inhale 2 Puffs Four Times A Day As Needed 4)  Calcium 500/d 500-200 Mg-Unit Tabs (Calcium Carbonate-Vitamin D) .... Take 1 Tablet By Mouth Three Times A Day 5)  Omeprazole 20 Mg Cpdr (Omeprazole) .... Take 1 Tablet By Mouth Two Times A Day 6)  Novolin 70/30 70-30 % Susp (Insulin Isophane & Regular) .... Inject 35 Units Subcutaneously Two Times A Day 7)  Insulin Syringe/needle 28g X 1/2" 0.5 Ml Misc (Insulin Syringe-Needle U-100) .... Per Instruction 8)  Freestyle Lite Test  Strp (Glucose Blood) .... Use To Check Blood Sugar 3x Daily 9)  Lancets  Misc (Lancets) .... Use To Check Blood Sugar Three Times Daily 10)  Nystatin  Powd (Nystatin) .... Apply To Affected  Area 2-3 Times Per Day 11)  Chantix 1 Mg Tabs (Varenicline Tartrate) .... Start 1/2 Tab By Mouth Daily For Three Days Then 1/2 Tab Twice Daily For 4 Days Then Increase To 1 Full Tab Twice Daily. 12)  Accupril 40 Mg Tabs (Quinapril Hcl) .... Take Two Tabs By Mouth Once A Day. 13)  Furosemide 40 Mg Tabs (Furosemide) .... Take 1 Tablet By Mouth Twice A Day 14)  K-Lor 20 Meq Pack (Potassium Chloride) .... Take 1 Tablet By Mouth Once A Day 15)  Toprol Xl 25 Mg Xr24h-Tab (Metoprolol Succinate) .... Take 1 Tablet By Mouth Once A Day 16)  Prednisone 1 Mg Tabs (Prednisone) .... Take As Directed.  Allergies (verified): No Known Drug Allergies  Past History:  Past Medical History: Last updated: 08/12/2009 Sarcoidosis (Dr Wert)-w/ liver involvement per biopsy 12/09 QT prolongation Diabetes mellitus,  type II Hypertension Heart catheterization 06-06-06 : No CAD, no RAS, normal EF Hx eclampsia Hx of scalp seborrheic dermatitis Abnormal LFT's:   Alk phos 713, TB 1.3, AST 52, ALT 18 in 11/09- Liver u/s and exam c/w HSM.  Hep B                               serology neg but Hep C ab +, HIV neg.  Coags slightly prolonged and alb low.  Started                                             empirically on prednisone for GI sarcoid and tapering.  AMA and Hep C viral load neg.                 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  Smokes 1/2 ppd x 20 years.  She denies illegal drugs and alcohol use.    Family History: Reviewed history from 03/11/2008 and no changes required. Children-healthy Mom-Col[employer] ca [employer]  Social History: Reviewed history from 04/06/2009 and no changes required. She is single, two children that are healthy.  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  Smokes 1/2 ppd x 20 years.  She denies illegal drugs and alcohol use.    Review of Systems       as per HPI.  Physical Exam  General:  alert, pleasant, obese, no distress.  Eyes:  anicteric.  Mouth:  MMM, no thrush. Neck:  no LAD, no JVD.  Lungs:  normal respiratory effort, fair air mvt, occasional scattered crackles, no wheezing.  Heart:   tachy, reg rhythm, no m/r/g. Abdomen:  obese, soft, mildly TTP in the epigastrium without rebound/gaurding and ND.  Msk:  no joint effusion. Extremities:  no peripheral edema.  Neurologic:  gait normal.  Cervical Nodes:  No lymphadenopathy noted Psych:  mood euthymic, affect appropriate.   Impression &  Recommendations:  Problem # 1:  NAUSEA AND VOMITING (ICD-787.01) ? related to UTI vs gastroenteritis.  Lipase up but not c/w pancreatitis.  Could also be related to adrenal insufficiency since abruptly stopped prednisone.  Also consider gastritis since  stopped her PPI last week and that is when symptoms started.  Pt being admitted b/c of hypotension, renal failure, and will get IV abx to tx UTi.  Keep NPO for now and start PPI and IV abx.   Orders: T-Culture, Urine [id number]) T-Urine Microalbumin w/creat. ratio [id number]) T-Comprehensive Metabolic Panel ([id number]) T-Urinalysis [id number]) T-Lipase [id number]) T-CBC w/Diff [id number])  Problem # 2:  HYPOTENSION (ICD-458.9) Hx c/w volume depletion.  She is also on multiple antihypertensive meds that could be contributing.  She does not appear septic at this time and she is afebrile and white count is normal.  Also ? adrenal insuff given abruptly stopping steroids so will check cortisol and restart steroids and consider stress dose steroids.   Problem # 3:  UTI (ICD-599.0) Hx of dysuria and + leuk/nitr and pyuria c/w uti.  Will admit and start IV abx and f/u urine cx.   Problem # 4:  CONGESTIVE HEART FAILURE (ICD-428.0) Will need repeat 2D echo in the near future to f/u EF.    Her updated medication list for this problem includes:    Accupril 40 Mg Tabs (Quinapril hcl) .Marland Kitchen... Take two tabs by mouth once a day.    Furosemide 40 Mg Tabs (Furosemide) .Marland Kitchen... Take 1 tablet by mouth twice a day    Toprol Xl 25 Mg Xr24h-tab (Metoprolol succinate) .Marland Kitchen... Take 1 tablet by mouth once a day  Problem # 5:  RENAL FAILURE, ACUTE (ICD-584.9) Hx c/w prerenal but BUN isn't up as would be expected.  F/u FENA, hold meds that could be contributing, such as ACE I and lasix.  Hydrate gently.    Problem # 6:  DIABETES MELLITUS, TYPE II, UNCONTROLLED (ICD-250.02) Stop metformin and ACE I due to acute renal failure.  Will start SSI while  inpt.  The following medications were removed from the medication list:    Glipizide Xl 5 Mg Xr24h-tab (Glipizide) .Marland Kitchen... Take 2 tablets by mouth once a day Her updated medication list for this problem includes:    Metformin Hcl 1000 Mg Tabs (Metformin hcl) .Marland Kitchen... Take 1 tablet by mouth two times a day    Novolin 70/30 70-30 % Susp (Insulin isophane & regular) ..... Inject 35 units subcutaneously two times a day    Accupril 40 Mg Tabs (Quinapril hcl) .Marland Kitchen... Take two tabs by mouth once a day.  Orders: T- Capillary Blood Glucose ([id number]) T-Hgb A1C (in-house) [id number])  Problem # 7:  NEPHROTIC SYNDROME (ICD-581.9) It is unusual that her urine protein normalized 04/11 and I suspect this may have been a lab error, so plan on repeating.  Problem # 8:  SARCOIDOSIS (ICD-135) Pulm and liver invt.  Presently, she has been on steroids b/c of liver invt and we have been tapering this slowly over the last few mos w/ goal to completely d/c.  LFT's stable today and hopefully will be able to continue to taper in the future once more stable clinically.   Complete Medication List: 1)  Prednisone 5 Mg Tabs (Prednisone) .... Take as directed. 2)  Metformin Hcl 1000 Mg Tabs (Metformin hcl) .... Take 1 tablet by mouth two times a day 3)  Albuterol 90 Mcg/act Aers (Albuterol) .... Inhale 2 puffs four times a day as needed 4)  Calcium 500/d 500-200 Mg-unit Tabs (Calcium carbonate-vitamin d) .... Take 1 tablet by mouth three times a day 5)  Omeprazole 20 Mg Cpdr (Omeprazole) .... Take 1 tablet by mouth two times a day 6)  Novolin 70/30 70-30 % Susp (Insulin  isophane & regular) .... Inject 35 units subcutaneously two times a day 7)  Insulin Syringe/needle 28g X 1/2" 0.5 Ml Misc (Insulin syringe-needle u-100) .... Per instruction 8)  Freestyle Lite Test Strp (Glucose blood) .... Use to check blood sugar 3x daily 9)  Lancets Misc (Lancets) .... Use to check blood sugar three times daily 10)  Nystatin Powd (Nystatin)  .... Apply to affected area 2-3 times per day 11)  Chantix 1 Mg Tabs (Varenicline tartrate) .... Start 1/2 tab by mouth daily for three days then 1/2 tab twice daily for 4 days then increase to 1 full tab twice daily. 12)  Accupril 40 Mg Tabs (Quinapril hcl) .... Take two tabs by mouth once a day. 13)  Furosemide 40 Mg Tabs (Furosemide) .... Take 1 tablet by mouth twice a day 14)  K-lor 20 Meq Pack (Potassium chloride) .... Take 1 tablet by mouth once a day 15)  Toprol Xl 25 Mg Xr24h-tab (Metoprolol succinate) .... Take 1 tablet by mouth once a day 16)  Prednisone 1 Mg Tabs (Prednisone) .... Take as directed.   Vital Signs:  Patient profile:   40 year old female Height:      61 inches (154.94 cm) Weight:      229.06 pounds (104.12 kg) BMI:     43.44 Temp:     97.8 degrees F (36.56 degrees C) oral Pulse (ortho):   108 / minute BP sitting:   97 / 56  (left arm) BP standing:   88 / 58  Vitals Entered By: Sander Nephew RN (September 29, 2009 1:35 PM)   Serial Vital Signs/Assessments:  Time      Position  BP       Pulse  Resp  Temp     By 1:54 PM   Lying LA  90/60    106                   Gladys Herbin RN 1:55 PM   Sitting   96/64    107                   Gladys Herbin RN 1:56 PM   Standing  88/58    [place]-Lyndora RN   Prevention & Chronic Care Immunizations   Influenza vaccine: Fluvax 3+  (04/08/2008)   Influenza vaccine deferral: Deferred  (05/06/2009)   Influenza vaccine due: 12/30/2009    Tetanus booster: Not documented   Td booster deferral: Deferred  (05/06/2009)    Pneumococcal vaccine: Not documented  Other Screening   Pap smear: NEGATIVE FOR INTRAEPITHELIAL LESIONS OR MALIGNANCY.  (03/11/2008)   Pap smear action/deferral: Deferred  (06/02/2009)    Mammogram: BI-RADS CATEGORY 2:  Benign finding(s).^MM DIGITAL DIAG LTD R  (04/20/2009)   Mammogram action/deferral: Ordered  (01/21/2009)   Smoking status: current  (09/29/2009)   Smoking cessation  counseling: yes  (09/29/2009)  Diabetes Mellitus   HgbA1C: 8.0  (09/29/2009)    Eye exam: Mild non-proliferative diabetic retinopathy.  OD   (06/17/2009)   Diabetic eye exam action/deferral: Ophthalmology referral  (02/09/2009)   Eye exam due: 12/2009    Foot exam: yes  (03/03/2009)   Foot exam action/deferral: Do today   High risk foot: Not documented   Foot care education: Done  (02/09/2009)   Foot exam due: 02/09/2010    Urine microalbumin/creatinine ratio: 25.0  (08/12/2009)  Urine microalbumin action/deferral: Ordered  Lipids   Total Cholesterol: 278  (01/21/2009)   Lipid panel action/deferral: LDL Direct Ordered   LDL: * mg/dL  (01/21/2009)   LDL Direct: 126  (02/09/2009)   HDL: 56  (01/21/2009)   Triglycerides: 403  (01/21/2009)  Hypertension   Last Blood Pressure: 97 / 56  (09/29/2009)   Serum creatinine: 0.92  (08/12/2009)   BMP action: Ordered   Serum potassium 4.6  (08/12/2009) CMP ordered   Self-Management Support :   Personal Goals (by the next clinic visit) :     Personal A1C goal: 7  (02/09/2009)     Personal blood pressure goal: 130/80  (01/21/2009)     Personal LDL goal: 100  (05/06/2009)    Patient will work on the following items until the next clinic visit to reach self-care goals:     Medications and monitoring: take my medicines every day, check my blood sugar, bring all of my medications to every visit, examine my feet every day  (09/29/2009)     Eating: drink diet soda or water instead of juice or soda, eat more vegetables, use fresh or frozen vegetables, eat foods that are low in salt, eat baked foods instead of fried foods, eat fruit for snacks and desserts, limit or avoid alcohol  (09/29/2009)     Activity: take a 30 minute walk every day, take the stairs instead of the elevator  (09/29/2009)    Diabetes self-management support: Written self-care plan, Education handout, Pre-printed educational material  (09/29/2009)   Diabetes care plan  printed   Diabetes education handout printed   Last diabetes self-management training by diabetes educator: 01/28/2009    Hypertension self-management support: Written self-care plan, Education handout, Pre-printed educational material  (09/29/2009)   Hypertension self-care plan printed.   Hypertension education handout printed     Primary Care Provider:  Junius Finner  MD   History of Present Illness: Pt is a 40 yo female w/ past med hx below here for routine f/u.  She has been nauseated periodically for about 1 month.  It got worse yesterday and she actually vomitted yesterday, non bloody, and this morning.  She has had abd'l pain w/ the vomiting.  She had a little diarrhea, non bloody.  No fevers/chills.  No known sick contacts.  She denies chest pain and SOB.  She has had some dizziness with this that occurs w/ standing for the last week.  She denies HA.  She has a little dysuria.  Of note, she ran out of prednisone last week.  Resp symptoms aren't worse than usual.  She notes she has been out of the omeprazole for about 1 week.  Her sugars have been doing better since she has cut back on the prednisone.   She is currently menstrating.   Depression History:      The patient denies a depressed mood most of the day and a diminished interest in her usual daily activities.         Laboratory Results   Blood Tests   Date/Time Received: September 29, 2009 2:15 PM  Date/Time Reported: Lenoria Farrier  September 29, 2009 2:15 PM   HGBA1C: 8.0%   (Normal Range: Non-Diabetic - 3-6%   Control Diabetic - 6-8%) CBG Random:: 167mg /dL      Process Orders Check Orders Results:     [facility]: ABN not required for this insurance Tests Sent for requisitioning (September 29, 2009 5:14 PM):  09/29/2009: [facility] -- T-Culture, Urine [id number] (signed)     09/29/2009: [facility] -- T-Urine Microalbumin w/creat. ratio [[id number]]  (signed)     09/29/2009: [facility] -- T-Comprehensive Metabolic Panel [id number] (signed)     09/29/2009: [facility] -- T-Urinalysis [id number] (signed)     09/29/2009: [facility] -- T-Lipase [id number] (signed)     09/29/2009: [facility] -- [facility] w/Diff [id number] (signed)

## 2010-05-31 NOTE — Progress Notes (Signed)
Summary: Refill/gh  Phone Note Refill Request Message from:  Fax from Pharmacy on December 16, 2008 10:43 AM  Refills Requested: Medication #1:  METFORMIN HCL 500 MG TABS Take 1 tablet by mouth once a day   Last Refilled: 11/09/2008  Method Requested: Fax to [facility] Initial call taken by: Sander Nephew RN,  December 16, 2008 10:44 AM    Prescriptions: METFORMIN HCL 500 MG TABS (METFORMIN HCL) Take 1 tablet by mouth once a day  #30 x 3   Entered and Authorized by:   Junius Finner  MD   Signed by:   Junius Finner  MD on 12/17/2008   Method used:   Faxed to ...       [facility] (retail)       [address] [place], [place]  [address]       Ph: [phone]       Fax: [phone]   RxID:   [id number]

## 2010-05-31 NOTE — Assessment & Plan Note (Signed)
Summary: acute-f/u with bs and bruises on rt elbow/rt knee/left arm/(w...   Vital Signs:  Patient profile:   40 year old female Height:      61 inches (154.94 cm) Weight:      213.7 pounds (97.14 kg) BMI:     40.52 Temp:     98.2 degrees F (36.78 degrees C) oral Pulse rate:   96 / minute BP sitting:   146 / 90  (right arm)  Vitals Entered By: Melody Blades Ditzler RN (November 23, 2008 4:01 PM) CC: Depression Is Patient Diabetic? Yes  Pain Assessment Patient in pain? no      Nutritional Status BMI of > 30 = obese Nutritional Status Detail appetite good CBG Result 390  Have you ever been in a relationship where you felt threatened, hurt or afraid?denies   Does patient need assistance? Functional Status Self care Ambulation Normal Comments Problems with bruising on arms and legs past month. Has bruise on upper left arm and "knot" at right elbow. Numbness on outer side of right knee for 2 weeks.   Primary Care [name]:  Junius Finner  MD  CC:  Depression.  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 It is not painful or warm to touch, not itching and not getting worse. No aspecific aggrevating or alleviating factors as far as she can tell. She has also noticed small nodule right above her right elbow also about 1 month ago, nontender, no redness or warmth to touch, about 1/2 inch in size, round and occasionally feels like it is movable. She denies fever, chills, shortness of breath, no chest pain, no palpitations or cough. No night sweats, no recent weight loss or gain, no changes in appetite. No other lumps or nodules noted on the rest of the body, no  myalgias or arthralgias, no joint swellings, no dry eyes or dry mouth, no mouth sores. Also denies morning stiffness. Tolerates prednisone rather well and has been feeling rather well lately. She would also like to know if we need to increase her metformin now that she is on chronic steroids. She denies any other bruises on her body, no blood in urine or stool. No weakness or fatigue. No recent traumas.   Depression History:      The patient denies a depressed mood most of the day and a diminished interest in her usual daily activities.  The patient denies significant weight loss, significant weight gain, insomnia, hypersomnia, psychomotor agitation, psychomotor retardation, fatigue (loss of energy), feelings of worthlessness (guilt), impaired concentration (indecisiveness), and recurrent thoughts of death or suicide.        The patient denies that she feels like life is not worth living, denies that she wishes that she were dead, and denies that she has thought about ending her life.         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 Take 4 Tabs By Mouth Daily or As Directed 2)  Metformin Hcl 500 Mg Tabs (Metformin Hcl) .... Take 1 Tablet By Mouth Once A Day 3)  Albuterol 90 Mcg/act Aers (Albuterol) .... Inhale 2 Puffs Four Times A Day As Needed 4)  Calcium 500/d 500-200 Mg-Unit Tabs (Calcium Carbonate-Vitamin D) .... Take 1 Tablet By Mouth Three Times A Day 5)  Omeprazole 20 Mg Cpdr (Omeprazole) .... Take 1 Tablet By Mouth Two Times A Day 6)  Flonase 50 Mcg/act Susp (Fluticasone Propionate) .... 2 Sprays in Each Nostril Daily  Current Medications (verified): 1)  Prednisone 10 Mg Tabs (Prednisone) .... Take 4 Tabs By Mouth Daily or As Directed 2)  Metformin Hcl 500 Mg Tabs (Metformin Hcl) .... Take 1 Tablet By Mouth Once A Day 3)  Albuterol 90 Mcg/act Aers (Albuterol) .... Inhale 2 Puffs Four Times A Day As Needed 4)  Calcium 500/d 500-200 Mg-Unit Tabs (Calcium Carbonate-Vitamin D) .... Take 1 Tablet By Mouth Three Times A Day 5)  Omeprazole 20 Mg Cpdr (Omeprazole) .... Take 1 Tablet By Mouth Two Times A Day  Allergies (verified): No Known Drug Allergies  Past History:  Past Medical History: Last updated: 05/22/2008 Sarcoidosis (Dr Melvyn Novas) QT prolongation Diabetes mellitus, type II Hypertension Heart catheterization 06-06-06 : No CAD, no RAS, normal EF Hx eclampsia Hx of scalp seborrheic dermatitis Abnormal LFT's:   Alk phos 713, TB 1.3, AST 52, ALT 18 in 11/09- Liver u/s and exam c/w HSM.  Hep B                             serology neg but Hep C ab +, HIV neg.  Coags slightly prolonged and alb low.  Started                                           empirically on prednisone for GI sarcoid.  AMA and Hep C viral load neg.                 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  Past Surgical History: Last updated: 05-29-2006 Tubal ligation  Family  History: Last updated: 03/11/2008 Children-healthy Mom-Colon ca Merrily Pew  Social History: Last updated: 03/11/2008 She is single, two children that are healthy.  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  She denies illegal drugs and etoh use.    Risk Factors: Alcohol Use: 0 (11/23/2008) Exercise: no (11/23/2008)  Risk Factors: Smoking Status: current (11/23/2008) Packs/Day: 1/3 (11/23/2008)  Family History:  Reviewed history from 03/11/2008 and no changes required. Children-healthy Mom-Colon ca [employer]  Social History: Reviewed history from 03/11/2008 and no changes required. She is single, two children that are healthy.  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  She denies illegal drugs and etoh use.    Review of Systems       per HPI  Physical Exam  General:  Well-developed,well-nourished,in no acute distress; alert,appropriate and cooperative throughout examination Head:  Normocephalic and atraumatic without obvious abnormalities. No apparent alopecia or balding. Nose:  External nasal examination shows no deformity or inflammation. Nasal mucosa are pink and moist without lesions or exudates. Mouth:  Oral mucosa and oropharynx without lesions or exudates.  Teeth in good repair. Neck:  No deformities, masses, or tenderness noted. Chest Wall:  No deformities, masses, or tenderness noted. Lungs:  Normal respiratory effort, chest expands symmetrically. Lungs are clear to auscultation, no crackles or wheezes. Heart:  Normal rate and regular rhythm. S1 and S2 normal without gallop, murmur, click, rub or other extra sounds. Abdomen:  Bowel sounds positive,abdomen soft and non-tender without masses, organomegaly or hernias noted. Msk:  normal ROM, no joint tenderness, no joint swelling, no joint warmth, no redness over joints, no joint deformities, no joint instability, no crepitation, and no muscle atrophy.  Small 1 cm round nodule above the right elbow,  not swollen,  nontender to palpation, no erythema, movable, smooth to touch Pulses:  R and L carotid,radial,femoral,dorsalis pedis and posterior tibial pulses are full and equal bilaterally Extremities:  trace left pedal edema and trace right pedal edema.   Neurologic:  No cranial nerve deficits noted. Station and gait are normal. Plantar reflexes are down-going bilaterally. DTRs are symmetrical throughout. Sensory, motor and coordinative functions appear intact. 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 CBG high this AM but she reports it is usually not that high. she did miss the metformin dose this AM and will take it when she comes home. She also reported eating right before coming here. I think that we can give it another shot and recheck her CBG on the next visit and will readjust the regimen as indicated.   Problem # 2:  LOCALIZED SUPERFICIAL SWELLING MASS OR LUMP (ICD-782.2) Above right elbow. Since not presenting any problem at this time and not really changing in size I think it is reasonable to watch it for next few weeks and then reexamine it. The nodule is not cst so it is not possile to drain anything from it and in addition it is nonpainful and noninfectious appearing. Pt has been on steroids for few months now and there is possibility that this is one of the side effects. I have told patient to continue to monitor this herself as well and if she does notice any differences or if it becomes more painful and reythematous that she needs to come back sooner, otherwise will see  her in 3-4 weeks and will reexamine it.   Problem # 3:  BRUISE (ICD-924.9) Getting better, pt has no other signs of bleeding anywhere on the body. Will check  coags just in case. Will monitor and I also advised patient to monitor any similar bruising anywhere else on the body. If she does notices more bruising or bleeding she needs to go to ED immediatelly.   Problem # 4:  HYPERTENSION (ICD-401.9) Slightly above the goal. I have discussed this with the patient and she has agreed to monitor the BP herself for next few weeks and she will write it down so that we can review it on her next visit and we can initiate the treatment if indicated.  BP today: 146/90 Prior BP: 123/82 (10/05/2008)  Labs Reviewed: K+: 3.7 (08/19/2008) Creat: : 0.84 (08/19/2008)   Chol: 171 (07/28/2008)   HDL: 26 (07/28/2008)   LDL: 115 (07/28/2008)   TG: 151 (07/28/2008)  Complete Medication List: 1)  Prednisone 10 Mg Tabs (Prednisone) .... Take 4 tabs by mouth daily or as directed 2)  Metformin Hcl 500 Mg Tabs (Metformin hcl) .... Take 1 tablet by mouth once a day 3)  Albuterol 90 Mcg/act Aers (Albuterol) .... Inhale 2 puffs four times a day as needed 4)  Calcium 500/d 500-200 Mg-unit Tabs (Calcium carbonate-vitamin d) .... Take 1 tablet by mouth three times a day 5)  Omeprazole 20 Mg Cpdr (Omeprazole) .... Take 1 tablet by mouth two times a day  Other Orders: Capillary Blood Glucose/CBG [id number]) T-CBC No Diff [id number]) T-PTT [id number]) T-Protime, Auto [id number])  Patient Instructions: 1)  Please schedule a follow-up appointment in 1 month. 2)  Will recheck the bruise and nodule at that time.   Prevention & Chronic Care Immunizations   Influenza vaccine: Fluvax 3+  (04/08/2008)    Tetanus booster: Not documented    Pneumococcal vaccine: Not documented  Other Screening   Pap smear: NEGATIVE FOR INTRAEPITHELIAL LESIONS OR MALIGNANCY.  (03/11/2008)   Smoking status: current  (11/23/2008)    Smoking cessation counseling: yes  (11/23/2008)  Lipids   Total Cholesterol: 171  (07/28/2008)   LDL: 115  (07/28/2008)   LDL Direct: Not documented   HDL: 26  (07/28/2008)   Triglycerides: 151  (07/28/2008)  Hypertension   Last Blood Pressure: 146 / 90  (11/23/2008)   Serum creatinine: 0.84  (08/19/2008)   Serum potassium 3.7  (08/19/2008)  Self-Management Support :    Patient will work on the following items until the next clinic visit to reach self-care goals:     Medications and monitoring: take my medicines every day, weigh myself weekly, examine my feet every day  (11/23/2008)     Eating: drink diet soda or water instead of juice or soda, eat more vegetables, use fresh or frozen vegetables, eat foods that are low in salt, eat baked foods instead of fried foods, eat fruit for snacks and desserts, limit or avoid alcohol  (11/23/2008)     Activity: take a 30 minute walk every day  (11/23/2008)    Hypertension self-management support: Not documented

## 2010-05-31 NOTE — Assessment & Plan Note (Signed)
Summary: Social Work   Social Work Evaluation Date  07/12/2009 Patient name Melody Gray  Primary MD   : Junius Finner  MD Social Worker's name : Katy Fitch MSW- [place] [phone]  Work phone: [phone]  Cell phone: .  Marland Kitchen     Alternate phone: . Marland Kitchen        Primary Reason for Referral:     Assist with Medicare D/Medicaid Eligibility/Medication Assist Comments Patient in need of Medicaid application to apply under [facility].  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.   Multiple health issues including diabetes and sarcoid.   Two children ages 29 and 27 who need to be renewed for Medicaid.  Pt has applications from [facility].   Visit at [facility] went well and she was billed at $159 which she was able to pay.  Action taken by Social Work: [employer] application with info on what financial items she needs.  She plans to complete the application at home and mail in to [address]. However, the patient will likely be denied due to income but she needs the denial letter to continue getting her meds at [facility].    Patient has my card for future questions or concerns.

## 2010-05-31 NOTE — Assessment & Plan Note (Signed)
Summary: infected tooth/pcp-wilson/hla   Vital Signs:  Patient Profile:   40 Years Old Female Height:     61 inches (154.94 cm) Weight:      191.3 pounds (86.95 kg) BMI:     36.28 Temp:     97.5 degrees F Pulse rate:   110 / minute BP sitting:   123 / 77  (right arm)  Pt. in pain?   yes    Location:   top left    Intensity:   5    Type:       throbbing  Vitals Entered By: Yvonna Alanis RN (April 14, 2008 9:34 AM)              Is Patient Diabetic? No Nutritional Status BMI of > 30 = obese  Have you ever been in a relationship where you felt threatened, hurt or afraid?No   Does patient need assistance? Functional Status Self care Ambulation Normal     PCP:  Junius Finner  MD  Chief Complaint:  top left tooth painful again.  History of Present Illness: 40 yo w/ sacroidosis (tx'd by Dr. Melvyn Novas), DMII, HTN, recent dx/o Hep C, seen 12/9 by Dr. Redmond Pulling for Hepatomegaly, hemoptysis --> to see GI in one week. Pt presents today for acute on chronic (going on 1 year) upper left molar (furthest back) tooth pain that worsened two days ago. Pain described as shooting pain with any chewing on left. Pt reports that it may have been more swollen, but swelling has resolved since Sunday. Denies fevers/ chills.     Current Allergies: No known allergies     Risk Factors: Tobacco use:  current    Year started:  25+ years    Cigarettes:  Yes -- 1/3 pack(s) per day Alcohol use:  no Exercise:  no Seatbelt use:  100 %  PAP Smear History:    Date of Last PAP Smear:  03/11/2008    Physical Exam  General:     NAD Head:     No cervical lymphadenopathy,  Mouth:     Top left furthest back molar is TTP, which induces shooting sharp pain. There is no associated soft tissue swelling or soft tissue tenderness. There is no visible deformity to affected tooth. No erythema, lesions in soft palate or in oropharynx. Lungs:     Mild inspiratory wheezes, no accessory muscle use and no  crackles.   Heart:     normal rate, regular rhythm, no murmur, no gallop, and no rub.   Abdomen:     Soft, non-tender.      Impression & Recommendations:  Problem # 1:  DENTAL PAIN (ICD-525.9) Acute on chronic dental pain. No evidence of infection and seems localized to single tooth. No obvious external deformity. Pain decreased with Excedrin. Pt on waiting list to see free/ low cost dentist in town. Today, we called another clinic in town that does emergency procedures for 15 dollars, but were unable to get through. We provided the patient with the phone number to call to schedule an appt. We also called [facility] dental clinic and they would be able to schedule her within a day, but the cost was prohibitive at 100-200 dollars. In the interim, we recommended to pt to try using Tylenol instead of Excedrin as the aspirin in combination with the prednisone may be harsh on her stomach. She is currently on protonix for ppx.  We will F/U with her at the next appt.   Complete Medication List:  1)  Albuterol 90 Mcg/act Aers (Albuterol) .... Inhale 2 puffs four times a day as needed 2)  Prednisone 20 Mg Tabs (Prednisone) .... Take two tablets by mouth once daily. 3)  Metoprolol Tartrate 25 Mg Tabs (Metoprolol tartrate) .... Take 1 tablet by mouth two times a day 4)  Calcium 500/d 500-200 Mg-unit Tabs (Calcium carbonate-vitamin d) .... Take 1 tablet by mouth three times a day 5)  Protonix 40 Mg Tbec (Pantoprazole sodium) .... Take 1 tablet by mouth once a day   Patient Instructions: 1)  Please follow-up with Dr. Redmond Pulling as scheduled. 2)  Please try Tylenol first for your tooth pain, as this is safer for your stomach. If Tylenol is not effective enough, you may use excedrin, but try to limit your use as it contains aspirin. 3)  An emergency dental visit may be scheduled with Odetta Pink at [phone] or [phone] and the cost will be 15 dollars. We tried to call them for an appt, but were unable to  because there clinic was closed today. Please try calling them tomorrow.  4)  We tried to call [place] [facility] and they said for an emergency dental procedure, it will cost 100-200 dollars. If you would like to try to call them, they have good availability. Please call Caren Griffins at [phone].   ]

## 2010-05-31 NOTE — Initial Assessments (Signed)
Hospital Admission History and Physical Exam Attending : Felicity Pellegrini, MD [facility]  CC: Abdominal Pain  HPI: Melody Gray is a 40 year old woman with liver biopsy proven GI sarcoidosis who presents to the ED complaining of increasing abdominal pain and fever. Her abdominal pain and "fullness" is chronic problem but over the past 2-3 days she has had worsening epigastric pain, no radiation, nausea, no vomitting or diarrhea. She has been out of her prednisone now for 4 days and usually takes 15mg  daily -recently decreased from 20mg  for the past several weeks.She notes generalized fatigue, a chronic non-productive cough, no dysuria, increased peripheral edema, no rashes or swollen lymph nodes or joints. She has had subjective fevers- been feeling "hot", for one week. No night sweats or chills. No mental status changes or HA.  Past Medical History: Sarcoidosis (Followed by Dr Melvyn Novas, Dr. 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   -Hep B serology neg     -Hep C ab +, viral load neg   -HIV neg     -AMA             -Liver biopsy 12/09 c/w liver sarcoid and portal fibrosis.  Cholelithiasis Abdominal US 11/09  Current Meds: ALBUTEROL 90 MCG/ACT AERS (ALBUTEROL) Inhale 2 puffs four times a day as needed PREDNISONE 5 MG TABS (PREDNISONE) Take 3 tablets by mouth once a day CALCIUM 500/D 500-200 MG-UNIT TABS (CALCIUM CARBONATE-VITAMIN D) Take 1 tablet by mouth three times a day OMEPRAZOLE 20 MG CPDR (OMEPRAZOLE) Take 1 tablet by mouth two times a day FLONASE 50 MCG/ACT SUSP (FLUTICASONE PROPIONATE) 2 sprays in each nostril daily   Allergies:  NKDA  Family History: 2 Children-healthy Mom-Colon ca [employer]   Social History: She is single, two children that are healthy.  She drives a  schoolbus.  Smokes 1/2 ppd x 20 years.  She denies illegal drugs and ETOH use.  Uninsured.  ROS: Per HPI  Vitals: 1600-Temp(arrival): 101.0 HR128 BP:132/91 Resp: 22 1700- Temp: 102.0 HR:115 BP:102/70 Resp:16  General Medical Physical Exam:  General Appearance:      non-icteric, non-toxic appearing, overweight  Head:      Inspection:     normocephalic without obvious abnormalities  Eyes:      External:     conjunctiva and lids normal      Pupils:     equal, round, and reactive to light and accommodation  Ears, Nose, Throat:      External:     no significant lesions or deformities noted      Nasal:     mucosa, septum, and turbinates normal      Dental:     fair dentition      Pharynx:     tongue normal; posterior pharynx without erythema or exudate  Neck:      Neck:     supple; no masses; trachea midline      Thyroid:     slight enlargement, no nodules  Respiratory:      Resp. effort:     no intercostal retractions or use of accessory muscles      Auscultation:     fine (dry) bibasilar fixed crackles  Chest Wall:      Chest wall:     no masses or tenderness  Axilla:     no axillary adenopathy, surgical scar  Cardiovascular:      Palpation:     no thrill or displacement of PMI      Auscultation:     normal S1 and  S2; no murmur, rub, or gallop  Gastrointestinal:      Abdomen:     distended, +HSM, epigastric tenderness mild, no masses, no fluid wave or stigmata of liver disease      Liver/spleen:     hepatomegaly 2 cm below RCM      Other:     bowel sounds active  Genitourinary:      no CVA tenderness  Musculoskeletal:      Digits/nails:     no cyanosis, clubbing, or petechiae      Head/neck:     normal alignment and mobility  Lymphatic:      Neck:     no cervical adenopathy      Axilla:     no axillary adenopathy  Skin:      Inspection:     no rashes, suspicious lesions, or ulcerations  Neurological:      Cranial N:     II - XII grossly  intact  Labs:  Ammonia                                  38         h      11-35             Lipase                                   62         h      11-59              Sodium (NA)                              132        l      135-145            Potassium (K)                            4.3               3.5-5.1            Chloride                                 99                96-112             CO2                                      24                19-32              Glucose  77                70-99              BUN                                      10                6-23               Creatinine                               0.87              0.4-1.2              Bilirubin, Total                         1.8        h      0.3-1.2            Alkaline Phosphatase                     430        h      39-117             SGOT (AST)                               77         h      0-37               SGPT (ALT)                               38         h      0-35               Total  Protein                           7.8               6.0-8.3            Albumin-Blood                            2.1        l      3.5-5.2            Calcium                                  9.2               8.4-10.5            Color, Urine                             YELLOW            YELLOW  Appearance  CLOUDY     a      CLEAR  Specific Gravity                         1.012             1.005-1.030  pH                                       5.5               5.0-8.0  Urine Glucose                            NEGATIVE          NEG                Bilirubin                                NEGATIVE          NEG  Ketones                                  NEGATIVE          NEG                Blood                                    NEGATIVE          NEG  Protein                                  NEGATIVE          NEG              m  Urobilinogen                              1.0               0.0-1.0          m  Nitrite                                  NEGATIVE          NEG  Leukocytes                               NEGATIVE          NEG   WBC                                      6.7               4.0-10.5           RBC  4.08              3.87-5.11        M  Hemoglobin (HGB)                         11.7       l      12.0-15.0        g/dL  Hematocrit (HCT)                         34.3       l      36.0-46.0        %  MCV                                      83.9              78.0-100.0       fL  MCHC                                     34.1              30.0-36.0        g/dL  RDW                                      15.7       h      11.5-15.5        %  Platelet Count (PLT)                     231               150-400          K/uL  Neutrophils, %                           67                43-77            %  Lymphocytes, %                           16                12-46            %  Monocytes, %                             15         h      3-12             %  Eosinophils, %                           3                 0-5              %  Basophils, %  0                 0-1              %  Neutrophils, Absolute                    4.5               1.7-7.7          K/uL  Lymphocytes, Absolute                    1.1               0.7-4.0          K/uL  Monocytes, Absolute                      1.0               0.1-1.0          K/uL  Eosinophils, Absolute                    0.2               0.0-0.7          K/uL  Basophils, Absolute                      0.0               0.0-0.1          K/uL  Imaging/Tests:  CT ABDOMEN AND PELVIS WITH CONTRAST    Technique: Multidetector CT imaging of the abdomen and pelvis was   performed using the standard protocol following bolus   administration of intravenous contrast.    Contrast: 100 ml Omnipaque 300    Comparison:  04/23/2008    CT ABDOMEN    Findings:  Chronic bronchiectasis is again noted at the lung bases.   There is continued enlargement of a right infrahilar lymph node   mass.  Lymph nodes at the esophageal hiatus have also increased in   size.    The liver remains diffusely enlarged with some irregularity margin   compatible with cirrhosis.  There is free fluid along the right   edge of the liver.  Layering gallstones are noted.  The spleen size   is not significantly changed.  There is diffuse heterogeneity of   the spleen without a dominant lesion.  Stomach, pancreas and common   bile duct are within normal limits.  A portal caval node is   slightly increased in size, now measuring 2.0 x 0.4 cm.  Multiple   para-aortic nodes are also increased in size.  The index organ is   at the level of the left renal hilum measuring 2.7 x 2.2 cm, not   significantly enlarged.  A node just below this is increased from   16 to 17 mm.  The adrenal glands and kidneys are within normal   limits bilaterally.  The bowel is unremarkable. Bone windows are   within normal limits.    IMPRESSION:    1.  Stable hepatosplenomegaly.   2.  Minimal free fluid along the right edge of the liver is new.   3.  Stable slight increase in the lymph nodes multiple stations   from the right hilum, through the esophageal hiatus, and at the   porta hepatis  and para-aortic stations.  While this may be   associated sarcoidosis, lymphoma is also considered.  CHEST - 2 VIEW    Comparison: 04/08/2009.    Findings: Fibrotic changes of the bases are again noted, left worse   than right with stable elevation of the left hemidiaphragm.  Heart   size is within normal limits.  The upper lung fields are clear.   Surgical clips are again noted in the left axilla.    IMPRESSION:    1.  No acute cardiopulmonary disease.   2.  Stable scarring at the bases, left greater than right with   elevation of the left hemidiaphragm.   3.   Postoperative changes of the left axilla.     Assessment & Plan:  Impression & Recommendations:  Problem # 1:  ABDOMINAL PAIN (ICD-789.00) Reviewed findings from CT scan. Not clear underlying cause- differential- sarcoidosis flare, lymphoma, ascites, mechanical distension of liver, cholelithiasis, gastritis, PUD, pancreatitis. Notably this is not an acute abdomen- there is mild distension but not markedly painful to palaption. At this time I have no evidence to support that this is infectious in nature- will hold treating for SBP. I will start Zosyn empirically for acute cholecystitis-intrabdominal process since she is febrile at 102, has free fluid in the abdomen and in immunosuppressed on long term steroids.  Plan: Will treat sarcoid flare with steroids- this has helped her abdominal pain in teh past. GI evaluation- Possible EGD Serial abdominal exams. Zosyn 3.375 q 6 hours IV hydration Will check one set of cardiac enzymes. Check UPreg  Problem # 2:  SARCOIDOSIS ([id number]) Likely Ms. Lobdell is having a flare of her sarcoidosis since she stopped her steroids 4 days ago - she did not understand the implications fo this for her disease process and risk for AI. She may have some early evidence of adrenal insufficiency since her BP is trending down. I will check a cortisol level. Will Start her on Solumedrol 40mg  IV daily and if needed start on stress dose steroids. Patient has known liver involvment by biopsy. Persistent fevers are seen in liver sarcoidosis. Most concerning is that her disease seems to be progressive on CT with enlarging lymph nodes now free fluid in her abdomen and liver changes consistent with cirrhosis. This is a quite rare progression since in about 1% of cases, sarcoidosis of the liver can lead to portal hypertension, cirrhosis, and liver failure. Th outlier here is that she has significant cholelithiasis so with her chronically elevated LFTs and now a slightly elevated  Lipase I question if she does not have gallstone pancreatitis or even early acute cholecystitis- the CT does not mention and inflammed gallbladder- but she does have free fluid along the portahepatis. Fortunately her pulonary symptoms are well controlled despite an enlarging mediastinal mass.  Plan: Will consult Dr. Deatra Ina for GI Sarcoidosis Management with GB disease Start Solu-Medrol 40mg  IV daily NPO for now.  Problem # 3:  GERD (ICD-530.81) Possible contributing factor to epigastric pain. Protonix 40mg  IV daily.  Problem # 4:  UNSPECIFIED ANEMIA (ICD-285.9)  Will guiac stools. Hb 11.7. Stable.  Reviewed last labs : Hgb: 11.5 (07/28/2008)   Hct: 34.4 (07/28/2008)   Platelets: 238 (07/28/2008) RBC: 3.93 (07/28/2008)   RDW: 15.8 (07/28/2008)   WBC: 5.1 (07/28/2008) MCV: 87.5 (07/28/2008)   MCHC: 33.4 (07/28/2008) Ferritin: 57 (03/23/2008) Iron: 41 (03/23/2008)   TIBC: 282 (03/23/2008)   % Sat: 15 (03/23/2008) B12: 412 (03/23/2008)   Folate: 671 (03/23/2008)   TSH:  2.802 (03/11/2008)  Problem # 5:  TACHYCARDIA (ICD-785.0) Will monitor on tele. Hx of Prolonged QT. Now in sinus tach. IV fluids.

## 2010-05-31 NOTE — Assessment & Plan Note (Signed)
Summary: [name] PCP/Melody Gray   Vital Signs:  Patient profile:   40 year old female Height:      61 inches (154.94 cm) Weight:      250.7 pounds (113.95 kg) BMI:     47.54 O2 Sat:      89 % on Room air Temp:     97.3 degrees F (36.28 degrees C) oral Pulse rate:   121 / minute BP sitting:   160 / 103  (right arm)  Vitals Entered By: Hilda Blades Ditzler RN (December 02, 2009 1:55 PM)  O2 Flow:  Room air Is Patient Diabetic? Yes Did you bring your meter with you today? No Pain Assessment Patient in pain? no      Nutritional Status BMI of > 30 = obese Nutritional Status Detail appetite ok CBG Result 165  Have you ever been in a relationship where you felt threatened, hurt or afraid?denies   Does patient need assistance? Functional Status Self care Ambulation Normal Comments Ck ears - hurting. Swelling both legs and right side of abd larger, tight feeling and numbness past month. PSO2 reck 2:05PM after 5 min 2 L O2 - 100% and pulse 107.   Primary Care Provider:  Junius Finner  MD   History of Present Illness: 40 yo with PMH HTN, DM type II, GERD, sarcoidosis who was recently  admitted for flare up in july presents for follow-up today.  She stated that she feels a little better since hospital discharge.  She has some chest tightness in mid-sternum which is chronic for her whenever she feels SOB.  She has been using 2L [place] at home and up to 4L when she is exerting herself.  Last night, she could not sleep at all and felt like gasping for air.  She normally sleeps on 2 pillows.  Patient reports increase in swelling in lower extremities.   She has not been taking Toprol because she did not have her orange card.    c/o of right upper quadrant numbness that has been going on in the past 1 month but no abdominal pain.  She states that she cannot sleep on her right side.  Also her ears feel like it's popping but no ear pain or discharge.    DM-checking blood sugar 3 times  daily-normally 110's but had one BS that was in 300s.  Taking Novolin 70/30 35u qAc and  35u at bedtime.   Depression History:      The patient denies a depressed mood most of the day and a diminished interest in her usual daily activities.         Preventive Screening-Counseling & Management  Alcohol-Tobacco     Alcohol drinks/day: 0     Smoking Status: quit     Smoking Cessation Counseling: yes     Packs/Day: 1/3     Year Started: 25+ years  Caffeine-Diet-Exercise     Does Patient Exercise: yes     Type of exercise: walking     Times/week: 5  Allergies: No Known Drug Allergies  Social History: Smoking Status:  quit  Review of Systems       The patient complains of dyspnea on exertion.    Physical Exam  General:  alert, well-developed, and well-nourished.  No acute distress. sitting comfortable with 2L [place] satting at 100% Lungs:  normal respiratory effort, no intercostal retractions, no accessory muscle use, no crackles, and no wheezes.  decrease breath sounds on bilateral bases  Heart:  tachycardic, regular rhythm, no murmur, no gallop, and no JVD.   Abdomen:  obese, soft, non-tender, normal bowel sounds, no guarding, no rigidity, no rebound tenderness, and no abdominal hernia.   Extremities:  2+ left pedal edema up to level of knee. 1+PD pulses bilaterally Neurologic:  alert & oriented X3, cranial nerves II-XII intact, and strength normal in all extremities.   Skin:  turgor normal and color normal.   Psych:  Oriented X3.     Impression & Recommendations:  Problem # 1:  SARCOIDOSIS (ICD-135) Patient still has SOB on exertion.  Currently on 2-4L [place] at home.    1.Decrease  Prednisone to  30mg  1 tablet by mouth daily 2.Continue Nasal Canula at home 2-4L 3. Has appointment with Melody. [name] on 12/25/09 4. Will return to clinic on Monday 12/06/09 for follow up    Orders: T-Comprehensive Metabolic Panel ([id number]) T-CBC w/Diff [id number])  Problem # 2:  HYPERTENSION  (ICD-401.9) Assessment: Deteriorated BP today 160/103.  Last visit in June 2011 was 123/84.  Patient has not been taking her Toprol because she did not have her orange card.  We  will stop Toprol XL today and start Nifedipine 30mg  by mouth daily.     The following medications were removed from the medication list:    Toprol Xl 25 Mg Xr24h-tab (Metoprolol succinate) .Marland Kitchen... Take 1 tablet by mouth once a day Her updated medication list for this problem includes:    Nifedipine 30 Mg Xr24h-tab (Nifedipine) .Marland Kitchen... 1 tablet by mouth daily    Furosemide 40 Mg Tabs (Furosemide) .Marland Kitchen... 1 tablet by mouth daily  Problem # 3:  DIABETES MELLITUS, TYPE II, UNCONTROLLED (ICD-250.02) Assessment: Unchanged CBG today 165.  HbA1c in 09/2009 was 8.0. Patient did not bring her meter today.   Continue current regimen Her updated medication list for this problem includes:    Metformin Hcl 1000 Mg Tabs (Metformin hcl) .Marland Kitchen... Take 1 tablet by mouth two times a day    Novolin 70/30 70-30 % Susp (Insulin isophane & regular) ..... Inject 35 units subcutaneously two times a day  Orders: T-Comprehensive Metabolic Panel ([id number]) T-CBC w/Diff [id number])  Problem # 4:  EDEMA (ICD-782.3) Assessment: Deteriorated Increase in edema lower extremities bilaterally and abdomen.  +2 pitting edema Start Lasix 40mg  daily and I will check her Cr level today.  She is scheduled for close follow-up on 12/06/09. Keep legs elevated   Orders: T-Comprehensive Metabolic Panel ([id number])  Her updated medication list for this problem includes:    Furosemide 40 Mg Tabs (Furosemide) .Marland Kitchen... 1 tablet by mouth daily  Complete Medication List: 1)  Metformin Hcl 1000 Mg Tabs (Metformin hcl) .... Take 1 tablet by mouth two times a day 2)  Albuterol 90 Mcg/act Aers (Albuterol) .... Inhale 2 puffs four times a day as needed 3)  Calcium 500/d 500-200 Mg-unit Tabs (Calcium carbonate-vitamin d) .... Take 1 tablet by mouth three times a  day 4)  Omeprazole 20 Mg Cpdr (Omeprazole) .... Take 1 tablet by mouth two times a day 5)  Novolin 70/30 70-30 % Susp (Insulin isophane & regular) .... Inject 35 units subcutaneously two times a day 6)  Insulin Syringe/needle 28g X 1/2" 0.5 Ml Misc (Insulin syringe-needle u-100) .... Per instruction 7)  Freestyle Lite Test Strp (Glucose blood) .... Use to check blood sugar 3x daily 8)  Lancets Misc (Lancets) .... Use to check blood sugar three times daily 9)  Prednisone 20 Mg Tabs (Prednisone) .... Take 2 tablets by mouth once  a day untill you see your pulmonary doctor. 10)  Ferrous Sulfate 325 (65 Fe) Mg Tabs (Ferrous sulfate) .... Take 1 tablet by mouth two times a day 11)  Nifedipine 30 Mg Xr24h-tab (Nifedipine) .Marland Kitchen.. 1 tablet by mouth daily 12)  Furosemide 40 Mg Tabs (Furosemide) .Marland Kitchen.. 1 tablet by mouth daily  Other Orders: Capillary Blood Glucose/CBG [id number])  Patient Instructions: 1)  1. Decrease Prednisone to 30mg  daily 2)  2. Start Nifedipine 30mg  1 tablet by mouth once daily for blood pressure 3)  3. Stop Toprol XL 4)  4. Start Lasix 40mg  1 table by mouth daily for your edema 5)  5. Keep your legs elevated  6)  6. Start ferrous sulfate  7)  7. Follow-up with Melody. Silverio Gray on Monday 8)  8. If symptoms worsen, please go to the Emergency Department or call our clinic Prescriptions: FUROSEMIDE 40 MG TABS (FUROSEMIDE) 1 tablet by mouth daily  #30 x 0   Entered and Authorized by:   Marcelle Smiling MD   Signed by:   Marcelle Smiling MD on 12/02/2009   Method used:   Faxed to ...       [facility] (retail)       [place], [place]  [address]       Ph: [phone]       Fax: [phone]   RxID:   [id number] NIFEDIPINE 30 MG XR24H-TAB (NIFEDIPINE) 1 tablet by mouth daily  #30 x 3   Entered and Authorized by:   Marcelle Smiling MD   Signed by:   Marcelle Smiling MD on 12/02/2009   Method used:   Faxed to ...       [facility] (retail)        [place], [place]  [address]       Ph: [phone]       Fax: [phone]   RxID:   [id number]  Process Orders Check Orders Results:     [facility]: ABN not required for this insurance Tests Sent for requisitioning (December 02, 2009 5:41 PM):     12/02/2009: [facility] -- T-Comprehensive Metabolic Panel [id number] (signed)     12/02/2009: [facility] -- [facility] w/Diff [id number] (signed)     Prevention & Chronic Care Immunizations   Influenza vaccine: Fluvax 3+  (04/08/2008)   Influenza vaccine deferral: Deferred  (05/06/2009)   Influenza vaccine due: 12/30/2009    Tetanus booster: Not documented   Td booster deferral: Deferred  (05/06/2009)    Pneumococcal vaccine: Not documented   Pneumococcal vaccine deferral: Deferred  (10/12/2009)  Other Screening   Pap smear: NEGATIVE FOR INTRAEPITHELIAL LESIONS OR MALIGNANCY.  (03/11/2008)   Pap smear action/deferral: Deferred  (06/02/2009)    Mammogram: BI-RADS CATEGORY 2:  Benign finding(s).^MM DIGITAL DIAG LTD R  (04/20/2009)   Mammogram action/deferral: Ordered  (01/21/2009)   Smoking status: quit  (12/02/2009)  Diabetes Mellitus   HgbA1C: 8.0  (09/29/2009)    Eye exam: Mild non-proliferative diabetic retinopathy.  OD   (06/17/2009)   Diabetic eye exam action/deferral: Ophthalmology referral  (02/09/2009)   Eye exam due: 12/2009    Foot exam: yes  (03/03/2009)   Foot exam action/deferral: Do today   High risk foot: Not documented   Foot care education: Done  (02/09/2009)   Foot exam due: 02/09/2010    Urine microalbumin/creatinine ratio: 25.0  (08/12/2009)  Urine microalbumin action/deferral: Ordered  Lipids   Total Cholesterol: 278  (01/21/2009)   Lipid panel action/deferral: LDL Direct Ordered   LDL: * mg/dL  (01/21/2009)   LDL Direct: 126  (02/09/2009)   HDL: 56  (01/21/2009)   Triglycerides: 403  (01/21/2009)  Hypertension    Last Blood Pressure: 160 / 103  (12/02/2009)   Serum creatinine: 1.97  (09/29/2009)   BMP action: Ordered   Serum potassium 3.9  (09/29/2009) CMP ordered   Self-Management Support :   Personal Goals (by the next clinic visit) :     Personal A1C goal: 7  (02/09/2009)     Personal blood pressure goal: 130/80  (01/21/2009)     Personal LDL goal: 100  (05/06/2009)    Patient will work on the following items until the next clinic visit to reach self-care goals:     Medications and monitoring: take my medicines every day, check my blood sugar, check my blood pressure, bring all of my medications to every visit, weigh myself weekly, examine my feet every day  (12/02/2009)     Eating: drink diet soda or water instead of juice or soda, eat more vegetables, use fresh or frozen vegetables, eat foods that are low in salt, eat baked foods instead of fried foods, eat fruit for snacks and desserts, limit or avoid alcohol  (12/02/2009)     Activity: take a 30 minute walk every day  (12/02/2009)    Diabetes self-management support: Written self-care plan, Education handout, Resources for patients handout  (12/02/2009)   Diabetes care plan printed   Diabetes education handout printed   Last diabetes self-management training by diabetes educator: 01/28/2009    Hypertension self-management support: Written self-care plan, Education handout, Resources for patients handout  (12/02/2009)   Hypertension self-care plan printed.   Hypertension education handout printed      Resource handout printed.   Appended Document: [name] PCP/Melody Gray I saw and examined Melody Gray with Melody Gray. I agree with Melody Gray note above with the following additions. We tried lasix in the hospital and she did not decline with diuresis. She was not D/C'd on Lasix. She now has sig increased edema - both LE and lower ABD wall. Agree with starting Lasix but need to follow closely to ensure that we don't drop intravascular vol  and decrease CO. Agree with changing BB to CCB - she might be one of the few that are responsive to CCB. She never really had great response to increase prednisone so will start taper to avoid the side effects of steroids. She has F/U with Melody Gray end of month. 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 Will need Melody Gray assistance to help figure out best treatment. Melody Gray

## 2010-05-31 NOTE — Assessment & Plan Note (Signed)
Summary: ACUTE/VEGA/1 WEEK RECHECK/CH   Vital Signs:  Patient profile:   40 year old female Height:      61 inches (154.94 cm) Weight:      234.6 pounds (106.64 kg) BMI:     44.49 O2 Sat:      89 % on Room air Temp:     97.9 degrees F Pulse rate:   126 / minute Resp:     24 per minute BP sitting:   140 / 95  (right arm) Cuff size:   large  Vitals Entered By: Yvonna Alanis RN (December 14, 2009 2:43 PM)  O2 Flow:  Room air  Serial Vital Signs/Assessments:  Time      Position  BP       Pulse  Resp  Temp     By 4:01 PM             146/105  120    24             Sharon Powers RN  CC: recheck per pt - ran out of portable O2 so short of breath on arrival - pt will call for O2 refill when she gets home today to be there to receive O2- pt states "stomach felt tight" since yesterda Is Patient Diabetic? Yes Did you bring your meter with you today? Yes Pain Assessment Patient in pain? no      Nutritional Status BMI of > 30 = obese CBG Result 149  Have you ever been in a relationship where you felt threatened, hurt or afraid?Unable to ask  Domestic Violence Intervention children in room  Does patient need assistance? Functional Status Self care Ambulation Normal Comments O2 sat 100% after approx. 5-10 min on 2L O2, and pulse decreased to 117.   Primary Care Provider:  Junius Finner  MD  CC:  recheck per pt - ran out of portable O2 so short of breath on arrival - pt will call for O2 refill when she gets home today to be there to receive O2- pt states "stomach felt tight" since yesterda.  History of Present Illness: 40 yo female with PMH of HTN, DM type II, GERD, sarcoidosis with recent flare up is here for follow up.  Patient has been follow up weekly since her hospital discharge until she sees Dr. Melvyn Novas on 8/27.  She has been feeling fine but she feels tired today and feel bloated which she attributes to staying up late last night and that her period might be coming soon.  She has  been on Room Air at home for most of the day and only requires 2 L when she gets up and moves around.  She is currently on Prednisone 20mg  x 1 week and will start 10mg  starting tomorrow until she sees Dr. Melvyn Novas.  Every once in a while she has mild SOB but denies chest pain.  Her BS has been spiking during night time- 225-390.    Preventive Screening-Counseling & Management  Alcohol-Tobacco     Alcohol drinks/day: 0     Smoking Status: quit     Smoking Cessation Counseling: yes     Packs/Day: 1/3     Year Started: 25+ years  Caffeine-Diet-Exercise     Does Patient Exercise: yes     Type of exercise: walking     Times/week: 5  Allergies: No Known Drug Allergies  Review of Systems  The patient denies anorexia, fever, weight loss, weight gain, vision loss, decreased hearing, hoarseness, chest  pain, syncope, dyspnea on exertion, peripheral edema, prolonged cough, headaches, hemoptysis, abdominal pain, melena, hematochezia, severe indigestion/heartburn, hematuria, incontinence, genital sores, muscle weakness, suspicious skin lesions, transient blindness, difficulty walking, depression, unusual weight change, abnormal bleeding, enlarged lymph nodes, angioedema, breast masses, and testicular masses.         mild SOB occasionally  Physical Exam  General:  alert, well-developed, well-nourished, and well-hydrated.   Lungs:  normal respiratory effort, no intercostal retractions, no accessory muscle use, no crackles, and no wheezes.  Coarse breath sounds at bases Heart:  Tachycardic, regular rhythm, no murmur, and no gallop.   Abdomen:  obese, soft, non-tender, normal bowel sounds, no rigidity, and no rebound tenderness.  +1 pitting edema on lower abdominal wall more on right side. Extremities:  +1-2 pitting edema up to knee level bilaterally, improve from last visit Neurologic:  alert & oriented X3 and cranial nerves II-XII intact.     Impression & Recommendations:  Problem # 1:   SARCOIDOSIS (ICD-135) Assessment Improved Patient reports improving on her SOB.   Continue Prednisone 10mg  until follow up visit with Dr. Melvyn Novas Continue oxygen via [place] as needed for SOB when exerting  I will check her BMET again today to make sure that her bicarb is not trending up. Stop Calcium Carbonate  Problem # 2:  DIABETES MELLITUS, TYPE II, UNCONTROLLED (ICD-250.02) Based on her glucose meter, she seems to have elevated glucose at night time.  She is currently on 35u bid,  I would increase her Novolin to 40 units at bed time and continue current other meds.    Her updated medication list for this problem includes:    Metformin Hcl 1000 Mg Tabs (Metformin hcl) .Marland Kitchen... Take 1 tablet by mouth two times a day    Novolin 70/30 70-30 % Susp (Insulin isophane & regular) ..... Inject 35 units subcutaneously two times a day  Problem # 3:  HYPERTENSION (ICD-401.9) 140/95 today on arrival.  Repeat 146/105.  I will increase Nifedipine to 60mg  Xr one tablet daily  Her updated medication list for this problem includes:    Nifedipine 60 Mg Xr24h-tab (Nifedipine) .Marland Kitchen... 1 tablet by mouth daily    Furosemide 40 Mg Tabs (Furosemide) .Marland Kitchen... 1 tablet by mouth daily  Problem # 4:  EDEMA (ICD-782.3) Improving; however, she still has +1-2 edema on lower extremities and lower abdominal wall.  Continue Lasix 40mg  as her Cr is WNL. Will check BMET again today as well. Her updated medication list for this problem includes:    Furosemide 40 Mg Tabs (Furosemide) .Marland Kitchen... 1 tablet by mouth daily  Problem # 5:  SINUS TACHYCARDIA (ICD-427.89) Pulse was 126 and on repeat was 117.  Patient has a history of tachycardia since 2008.  She did not have her portable O2 machine with her today as she ran out of it right before she came to clinic.  She was hypoxic with an O2 sat of 89% on arrival.  She was placed on 2 L of  and her O2 sat increased to 100%.  I will continue to monitor.  Will not place patient on beta blocker  because of her lung disease.    Complete Medication List: 1)  Metformin Hcl 1000 Mg Tabs (Metformin hcl) .... Take 1 tablet by mouth two times a day 2)  Albuterol 90 Mcg/act Aers (Albuterol) .... Inhale 2 puffs four times a day as needed 3)  Calcium 500/d 500-200 Mg-unit Tabs (Calcium carbonate-vitamin d) .... Take 1 tablet by mouth three times  a day 4)  Omeprazole 20 Mg Cpdr (Omeprazole) .... Take 1 tablet by mouth two times a day 5)  Novolin 70/30 70-30 % Susp (Insulin isophane & regular) .... Inject 35 units subcutaneously two times a day 6)  Insulin Syringe/needle 28g X 1/2" 0.5 Ml Misc (Insulin syringe-needle u-100) .... Per instruction 7)  Freestyle Lite Test Strp (Glucose blood) .... Use to check blood sugar 3x daily 8)  Lancets Misc (Lancets) .... Use to check blood sugar three times daily 9)  Prednisone 20 Mg Tabs (Prednisone) .... Take 2 tablets by mouth once a day untill you see your pulmonary doctor. 10)  Ferrous Sulfate 325 (65 Fe) Mg Tabs (Ferrous sulfate) .... Take 1 tablet by mouth two times a day 11)  Nifedipine 60 Mg Xr24h-tab (Nifedipine) .Marland Kitchen.. 1 tablet by mouth daily 12)  Furosemide 40 Mg Tabs (Furosemide) .Marland Kitchen.. 1 tablet by mouth daily  Other Orders: Capillary Blood Glucose/CBG ([id number]) T-Basic Metabolic Panel ([id number])  Patient Instructions: 1)  1. Stop Calcium Carbonate 2)  2. Increase your Nifedipine to 60mg  once daily 3)  3.Increase your Novolin to 40 units at night 4)  4. Start Prednisone 10mg  on 8/17/2011until you see Dr. Melvyn Novas 5)  5. Continue oxygen via nasal canula as needed when you are exerting yourself 6)  6. Follow up with Dr. Wendee Beavers in 2-3 weeks 7)  7. Get bloodwork today Prescriptions: NIFEDIPINE 60 MG XR24H-TAB (NIFEDIPINE) 1 tablet by mouth daily  #30 x 3   Entered and Authorized by:   Marcelle Smiling MD   Signed by:   Marcelle Smiling MD on 12/14/2009   Method used:   Faxed to ...       [facility] (retail)       [place], [place]  [address]       Ph: [phone]       Fax: [phone]   RxID:   [id number]  Process Orders Check Orders Results:     [facility]: ABN not required for this insurance Tests Sent for requisitioning (December 14, 2009 4:24 PM):     12/14/2009: [facility] -- T-Basic Metabolic Panel [id number] (signed)     Prevention & Chronic Care Immunizations   Influenza vaccine: Fluvax 3+  (04/08/2008)   Influenza vaccine deferral: Deferred  (05/06/2009)   Influenza vaccine due: 12/30/2009    Tetanus booster: Not documented   Td booster deferral: Deferred  (05/06/2009)    Pneumococcal vaccine: Not documented   Pneumococcal vaccine deferral: Deferred  (10/12/2009)  Other Screening   Pap smear: NEGATIVE FOR INTRAEPITHELIAL LESIONS OR MALIGNANCY.  (03/11/2008)   Pap smear action/deferral: Deferred  (06/02/2009)    Mammogram: BI-RADS CATEGORY 2:  Benign finding(s).^MM DIGITAL DIAG LTD R  (04/20/2009)   Mammogram action/deferral: Ordered  (01/21/2009)   Smoking status: quit  (12/14/2009)  Diabetes Mellitus   HgbA1C: 8.0  (09/29/2009)    Eye exam: Mild non-proliferative diabetic retinopathy.  OD   (06/17/2009)   Diabetic eye exam action/deferral: Ophthalmology referral  (02/09/2009)   Eye exam due: 12/2009    Foot exam: yes  (03/03/2009)   Foot exam action/deferral: Do today   High risk foot: Not documented   Foot care education: Done  (02/09/2009)   Foot exam due: 02/09/2010    Urine microalbumin/creatinine ratio: 25.0  (08/12/2009)   Urine microalbumin action/deferral: Ordered  Lipids   Total Cholesterol: 278  (01/21/2009)   Lipid panel action/deferral: LDL Direct  Ordered   LDL: * mg/dL  (01/21/2009)   LDL Direct: 126  (02/09/2009)   HDL: 56  (01/21/2009)   Triglycerides: 403  (01/21/2009)   Lipid panel due: 01/07/2010  Hypertension   Last Blood Pressure: 140 / 95  (12/14/2009)   Serum creatinine: 0.67   (12/07/2009)   BMP action: Ordered   Serum potassium 3.7  (12/07/2009)  Self-Management Support :   Personal Goals (by the next clinic visit) :     Personal A1C goal: 7  (02/09/2009)     Personal blood pressure goal: 130/80  (01/21/2009)     Personal LDL goal: 100  (05/06/2009)    Patient will work on the following items until the next clinic visit to reach self-care goals:     Medications and monitoring: take my medicines every day, check my blood sugar, bring all of my medications to every visit, examine my feet every day  (12/14/2009)     Eating: drink diet soda or water instead of juice or soda, eat more vegetables, use fresh or frozen vegetables, eat foods that are low in salt  (12/14/2009)     Activity: join a walking program  (12/14/2009)     Other: walking when not too short of breath  (12/14/2009)    Diabetes self-management support: Pre-printed educational material, Written self-care plan, Resources for patients handout  (12/14/2009)   Diabetes care plan printed   Last diabetes self-management training by diabetes educator: 01/28/2009    Hypertension self-management support: Pre-printed educational material, Written self-care plan, Resources for patients handout  (12/14/2009)   Hypertension self-care plan printed.      Resource handout printed.

## 2010-05-31 NOTE — Progress Notes (Signed)
Summary: phone/gg  Phone Note Call from Patient   Summary of Call: Pt called and needs note stating she needs to go on disability.  Will you write this for her? Pt # [id number]  or [id number] Initial call taken by: Gevena Cotton RN,  August 13, 2008 3:19 PM  Follow-up for Phone Call        Will forward to Katy Fitch- she really needs this and also to be referred for liver transplant-[facility].  Butch Penny- this is a very sick lady with no health insurance and a serious progressive liver disease that may need transplant. I think we need to start the process of getting her on medicaid/disability- she tells me her children are on medicaid but she is not. She has the potential to get sick very fast. 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 Would you be able to help me write a letter about this patient for siability and speak tith her regarding her options?? Thanks! -BG Follow-up by: Rhea Pink  DO,  August 13, 2008 9:06 PM  Additional Follow-up for Phone Call Additional follow up Details #1::        I will begin drafting a letter on the patient's behalf and get in touch with her about filing for disability.  Katy Fitch  August 17, 2008 11:13 AM     Additional Follow-up for Phone Call Additional follow up Details #2::    letter signed [facility] Follow-up by: Rhea Pink  DO,  Sep 02, 2008 7:34 AM

## 2010-05-31 NOTE — Assessment & Plan Note (Signed)
Summary: 34MONTH F/U/EST/VS   Vital Signs:  Patient profile:   40 year old female Height:      61 inches (154.94 cm) Weight:      239.1 pounds (108.68 kg) BMI:     45.34 O2 Sat:      95 % on Room air Temp:     98.5 degrees F oral Pulse rate:   102 / minute BP sitting:   106 / 66  (right arm)  Vitals Entered By: Morrison Old RN (August 12, 2009 3:16 PM)  O2 Flow:  Room air CC: F/U visit. Is Patient Diabetic? Yes Did you bring your meter with you today? Yes Pain Assessment Patient in pain? no      Nutritional Status BMI of > 30 = obese CBG Result 117  Have you ever been in a relationship where you felt threatened, hurt or afraid?No   Does patient need assistance? Functional Status Self care Ambulation Normal   Primary Care Provider:  Junius Finner  MD  CC:  F/U visit.Marland Kitchen  History of Present Illness: Pt is a 40 yo female here for f/u on her chronic medical conditions below.  She saw Dr. Caryn Section recently  for sinusitis and her symptoms are better after finishing a course of azithro.  Her SOB is stable and she thinks it may be improving since it is primarily with exertion only now.  She has continued to taper the steroids and is now down to 10 mg a day of pred and is tolerating this well.  She notes her sugars are doing much better, too.   Per her meter they have been from 100-186 in the morning.  She has not had any lows.   Depression History:      The patient denies a depressed mood most of the day and a diminished interest in her usual daily activities.         Preventive Screening-Counseling & Management  Alcohol-Tobacco     Alcohol drinks/day: 0     Smoking Status: current     Smoking Cessation Counseling: yes     Packs/Day: 1/3     Year Started: 25+ years  Caffeine-Diet-Exercise     Does Patient Exercise: yes     Type of exercise: walking     Times/week: 5  Current Medications (verified): 1)  Prednisone 10 Mg Tabs (Prednisone) .... One Tab Daily 2)   Metformin Hcl 1000 Mg Tabs (Metformin Hcl) .... Take 1 Tablet By Mouth Two Times A Day 3)  Albuterol 90 Mcg/act Aers (Albuterol) .... Inhale 2 Puffs Four Times A Day As Needed 4)  Calcium 500/d 500-200 Mg-Unit Tabs (Calcium Carbonate-Vitamin D) .... Take 1 Tablet By Mouth Three Times A Day 5)  Omeprazole 20 Mg Cpdr (Omeprazole) .... Take 1 Tablet By Mouth Two Times A Day 6)  Novolin 70/30 70-30 % Susp (Insulin Isophane & Regular) .... Inject 35 Units Subcutaneously Two Times A Day 7)  Insulin Syringe/needle 28g X 1/2" 0.5 Ml Misc (Insulin Syringe-Needle U-100) .... Per Instruction 8)  Freestyle Lite Test  Strp (Glucose Blood) .... Use To Check Blood Sugar 3x Daily 9)  Lancets  Misc (Lancets) .... Use To Check Blood Sugar Three Times Daily 10)  Nystatin  Powd (Nystatin) .... Apply To Affected Area 2-3 Times Per Day 11)  Chantix 1 Mg Tabs (Varenicline Tartrate) .... Take 1 Tablet By Mouth Once A Day 12)  Accupril 40 Mg Tabs (Quinapril Hcl) .... Take Two Tabs By Mouth Once A Day.  13)  Furosemide 40 Mg Tabs (Furosemide) .... Take 1 Tablet By Mouth Twice A Day 14)  K-Lor 20 Meq Pack (Potassium Chloride) .... Take 1 Tablet By Mouth Once A Day 15)  Toprol Xl 25 Mg Xr24h-Tab (Metoprolol Succinate) .... Take 1 Tablet By Mouth Once A Day 16)  Glipizide Xl 5 Mg Xr24h-Tab (Glipizide) .... Take 2 Tablets By Mouth Once A Day 17)  Nystatin 100000 Unit/ml Susp (Nystatin) .Marland Kitchen.. 1 Spoonful Swish and Swallow, Four Times A Day  Allergies (verified): No Known Drug Allergies  Past History:  Social History: Last updated: 04/06/2009 She is single, two children that are healthy.  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  Smokes 1/2 ppd x 20 years.  She denies illegal drugs and alcohol use.    Past Medical History: Sarcoidosis (Dr Wert)-w/ liver involvement per biopsy 12/09 QT prolongation Diabetes mellitus, type II Hypertension Heart catheterization 06-06-06 : No CAD, no RAS, normal EF Hx eclampsia Hx of scalp  seborrheic dermatitis Abnormal LFT's:   Alk phos 713, TB 1.3, AST 52, ALT 18 in 11/09- Liver u/s and exam c/w [facility].  Hep B                               serology neg but Hep C ab +, HIV neg.  Coags slightly prolonged and alb low.  Started                                             empirically on prednisone for GI sarcoid and tapering.  AMA and Hep C viral load neg.                 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 She is single, two children that are healthy.  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  Smokes 1/2 ppd x 20 years.  She denies illegal drugs and alcohol use.  Does Patient Exercise:  yes  Review of Systems       as per hpi.   Physical Exam  General:  alert, pleasant, oriented, appropriately groomed, no distress.  Eyes:  anicteric Mouth:  MMM, no thrush today. Lungs:  normal respiratory effort.  occasional wheezes, fair air movement.  Heart:  borderline tachy, reg rhythm, no m/r/g. Abdomen:  obese, soft, nt and nd.  Extremities:  trace pitting edema on R LE, 1+ on L LE-discrepency is chronic per pt.  Neurologic:  gait normal Psych:  mood euthymic.   Impression & Recommendations:  Problem # 1:  SARCOIDOSIS (ICD-135) Seems to be doing ok.  Will repeat LFT's to make sure they are not increasing.  Will start tapering prednisone by 1 mg/week with goal of ultimately being off this.  Will likely repeat PFT's soon to see how she is doing from a pulm status.  I have encouraged her to stop smoking.   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  Will repeat urine microalbumin/creatinine ratio today to see if proteinuria has improved w/ max dose ACE I.  Orders: T-Urine Microalbumin w/creat. ratio  [phone]) T-Comprehensive Metabolic Panel ([id number])  Problem # 3:  CONGESTIVE HEART  FAILURE (ICD-428.0) Nonischemic.  Plan on repeating 2D echo around 06/11. No evidence for decompensation.  Her updated medication list for this problem includes:    Accupril 40 Mg Tabs (Quinapril hcl) .Marland Kitchen... Take two tabs by mouth once a day.    Furosemide 40 Mg Tabs (Furosemide) .Marland Kitchen... Take 1 tablet by mouth twice a day    Toprol Xl 25 Mg Xr24h-tab (Metoprolol succinate) .Marland Kitchen... Take 1 tablet by mouth once a day  Problem # 4:  DIABETES MELLITUS, TYPE II, UNCONTROLLED (ICD-250.02) CBG's are much improved w/ decrease in prednisone.  I have asked her to monitor for evidence of lows.  Cont current meds.  Up to date on eye and foot exam.  Cont ACE I.  No statin b/c of hepatic sarcoid and risk of pregnancy and lipids close to goal.  Her updated medication list for this problem includes:    Metformin Hcl 1000 Mg Tabs (Metformin hcl) .Marland Kitchen... Take 1 tablet by mouth two times a day    Novolin 70/30 70-30 % Susp (Insulin isophane & regular) ..... Inject 35 units subcutaneously two times a day    Accupril 40 Mg Tabs (Quinapril hcl) .Marland Kitchen... Take two tabs by mouth once a day.    Glipizide Xl 5 Mg Xr24h-tab (Glipizide) .Marland Kitchen... Take 2 tablets by mouth once a day  Problem # 5:  NICOTINE ADDICTION (ICD-305.1) I have councelled on cessation.  She has not been able to pick up the chantix at this time b/c [facility] [place] [facility] yet,  Her updated medication list for this problem includes:    Chantix 1 Mg Tabs (Varenicline tartrate) .Marland Kitchen... Take 1 tablet by mouth once a day  Problem # 6:  HYPERTENSION (ICD-401.9) K/cr today.  BP at goal.  Her updated medication list for this problem includes:    Accupril 40 Mg Tabs (Quinapril hcl) .Marland Kitchen... Take two tabs by mouth once a day.    Furosemide 40 Mg Tabs (Furosemide) .Marland Kitchen... Take 1 tablet by mouth twice a day    Toprol Xl 25 Mg Xr24h-tab (Metoprolol succinate) .Marland Kitchen...  Take 1 tablet by mouth once a day  BP today: 106/66 Prior BP: 125/84 (07/19/2009)  Labs Reviewed: K+: 4.3 (07/12/2009) Creat: : 0.92 (07/12/2009)   Chol: 278 (01/21/2009)   HDL: 56 (01/21/2009)   LDL: * mg/dL (01/21/2009)   TG: 403 (01/21/2009)  Problem # 7:  CANDIDIASIS OF MOUTH (ICD-112.0) Resolved on exam today.   Complete Medication List: 1)  Prednisone 5 Mg Tabs (Prednisone) .... Take as directed. 2)  Metformin Hcl 1000 Mg Tabs (Metformin hcl) .... Take 1 tablet by mouth two times a day 3)  Albuterol 90 Mcg/act Aers (Albuterol) .... Inhale 2 puffs four times a day as needed 4)  Calcium 500/d 500-200 Mg-unit Tabs (Calcium carbonate-vitamin d) .... Take 1 tablet by mouth three times a day 5)  Omeprazole 20 Mg Cpdr (Omeprazole) .... Take 1 tablet by mouth two times a day 6)  Novolin 70/30 70-30 % Susp (Insulin isophane & regular) .... Inject 35 units subcutaneously two times a day 7)  Insulin Syringe/needle 28g X 1/2" 0.5 Ml Misc (Insulin syringe-needle u-100) .... Per instruction 8)  Freestyle Lite Test Strp (Glucose blood) .... Use to check blood sugar 3x daily 9)  Lancets Misc (Lancets) .... Use to check blood sugar three times daily 10)  Nystatin Powd (Nystatin) .... Apply to affected area 2-3 times per day 11)  Chantix 1 Mg Tabs (Varenicline tartrate) .... Take 1 tablet  by mouth once a day 12)  Accupril 40 Mg Tabs (Quinapril hcl) .... Take two tabs by mouth once a day. 13)  Furosemide 40 Mg Tabs (Furosemide) .... Take 1 tablet by mouth twice a day 14)  K-lor 20 Meq Pack (Potassium chloride) .... Take 1 tablet by mouth once a day 15)  Toprol Xl 25 Mg Xr24h-tab (Metoprolol succinate) .... Take 1 tablet by mouth once a day 16)  Glipizide Xl 5 Mg Xr24h-tab (Glipizide) .... Take 2 tablets by mouth once a day 17)  Nystatin 100000 Unit/ml Susp (Nystatin) .Marland Kitchen.. 1 spoonful swish and swallow, four times a day 18)  Prednisone 1 Mg Tabs (Prednisone) .... Take as directed.  Other  Orders: Capillary Blood Glucose/CBG [id number])  Patient Instructions: 1)  Please make a followup appointment in 1 month. 2)  Please decrease your prednisone by 1 mg a week until you get to 5 mg a day. 3)  Please try to lose weight.  Goal of 5 lbs at our next visit.  I know you can do it! 4)  Please try to quit smoking.  You can try calling 1-800-QUIT-NOW which is a free hotline to help you quit smoking.  Prescriptions: PREDNISONE 1 MG TABS (PREDNISONE) Take as directed.  #105 x 0   Entered and Authorized by:   Junius Finner  MD   Signed by:   Junius Finner  MD on 08/12/2009   Method used:   Faxed to ...       [facility] (retail)       [place], [place]  [address]       Ph: [phone]       Fax: [phone]   RxID:   [id number] PREDNISONE 5 MG TABS (PREDNISONE) Take as directed.  #30 x 0   Entered and Authorized by:   Junius Finner  MD   Signed by:   Junius Finner  MD on 08/12/2009   Method used:   Faxed to ...       [facility] (retail)       [place], [place]  [address]       Ph: [phone]       Fax: [phone]   RxID:   [id number] K-LOR 20 MEQ PACK (POTASSIUM CHLORIDE) Take 1 tablet by mouth once a day  #30 x 3   Entered and Authorized by:   Junius Finner  MD   Signed by:   Junius Finner  MD on 08/12/2009   Method used:   Faxed to ...       [facility] (retail)       [place], [place]  [address]       Ph: [phone]       Fax: [phone]   RxID:   [id number] FUROSEMIDE 40 MG TABS (FUROSEMIDE) Take 1 tablet by mouth twice a day  #60 x 3   Entered and Authorized by:   Junius Finner  MD   Signed by:   Junius Finner  MD on 08/12/2009   Method used:   Faxed to ...       [facility] (retail)       [address] [place], [place]  [address]       Ph: [phone]  Fax: [phone]   [id number]:[id number]   Prevention & Chronic Care Immunizations   Influenza vaccine: Fluvax 3+  (04/08/2008)   Influenza vaccine deferral: Deferred  (05/06/2009)   Influenza vaccine due: 12/30/2009    Tetanus booster: Not documented   Td booster deferral: Deferred  (05/06/2009)    Pneumococcal vaccine: Not documented  Other Screening   Pap smear: NEGATIVE FOR INTRAEPITHELIAL LESIONS OR MALIGNANCY.  (03/11/2008)   Pap smear action/deferral: Deferred  (06/02/2009)    Mammogram: BI-RADS CATEGORY 2:  Benign finding(s).^MM DIGITAL DIAG LTD R  (04/20/2009)   Mammogram action/deferral: Ordered  (01/21/2009)   Smoking status: current  (08/12/2009)   Smoking cessation counseling: yes  (08/12/2009)  Diabetes Mellitus   HgbA1C: 9.8  (07/12/2009)    Eye exam: Mild non-proliferative diabetic retinopathy.  OD   (06/17/2009)   Diabetic eye exam action/deferral: Ophthalmology referral  (02/09/2009)   Eye exam due: 12/2009    Foot exam: yes  (03/03/2009)   Foot exam action/deferral: Do today   High risk foot: Not documented   Foot care education: Done  (02/09/2009)   Foot exam due: 02/09/2010    Urine microalbumin/creatinine ratio: 3417.1  (04/06/2009)   Urine microalbumin action/deferral: Ordered    Diabetes flowsheet reviewed?: Yes   Progress toward A1C goal: Improved  Lipids   Total Cholesterol: 278  (01/21/2009)   Lipid panel action/deferral: LDL Direct Ordered   LDL: * mg/dL  (01/21/2009)   LDL Direct: 126  (02/09/2009)   HDL: 56  (01/21/2009)   Triglycerides: 403  (01/21/2009)  Hypertension   Last Blood Pressure: 106 / 66  (08/12/2009)   Serum creatinine: 0.92  (07/12/2009)   BMP action: Ordered   Serum potassium 4.3  (07/12/2009) CMP ordered     Hypertension flowsheet reviewed?: Yes   Progress toward BP goal: At goal  Self-Management Support :   Personal Goals (by the next clinic visit) :     Personal A1C goal: 7  (02/09/2009)     Personal blood pressure goal:  130/80  (01/21/2009)     Personal LDL goal: 100  (05/06/2009)    Patient will work on the following items until the next clinic visit to reach self-care goals:     Medications and monitoring: check my blood sugar, bring all of my medications to every visit, examine my feet every day  (08/12/2009)     Eating: drink diet soda or water instead of juice or soda, eat more vegetables, use fresh or frozen vegetables, eat foods that are low in salt, eat baked foods instead of fried foods, eat fruit for snacks and desserts  (08/12/2009)     Activity: take a 30 minute walk every day, take the stairs instead of the elevator  (08/12/2009)    Diabetes self-management support: Written self-care plan  (08/12/2009)   Diabetes care plan printed   Last diabetes self-management training by diabetes educator: 01/28/2009    Hypertension self-management support: Written self-care plan  (08/12/2009)   Hypertension self-care plan printed.  Process Orders Check Orders Results:     [facility]: [id number] not required for this insurance Tests Sent for requisitioning (August 16, 2009 8:15 AM):     08/12/2009: [facility] -- T-Urine Microalbumin w/creat. ratio [[id number]] (signed)     08/12/2009: [facility] -- T-Comprehensive Metabolic Panel [id number] (signed)

## 2010-05-31 NOTE — Consult Note (Signed)
Summary: [employer].  [employer].   Imported By: Bonner Puna 06/29/2009 09:48:20  _____________________________________________________________________  External Attachment:    Type:   Image     Comment:   External Document  Appended Document: [facility].    Clinical Lists Changes  Problems: Changed problem from BLURRED VISION (ICD-368.8) to DIABETIC  RETINOPATHY (ICD-250.50)      Appended Document: [facility].    Clinical Lists Changes  Observations: Added new observation of [id number]: 12/2009 (07/20/2009 14:58) Added new observation of DIAB EYE EX: Mild non-proliferative diabetic retinopathy.  OD  (06/17/2009 15:00)       Diabetic Eye Exam  Procedure date:  06/17/2009  Findings:      Mild non-proliferative diabetic retinopathy.  OD   Procedures Next Due Date:    Diabetic Eye Exam: 12/2009   Diabetic Eye Exam  Procedure date:  06/17/2009  Findings:      Mild non-proliferative diabetic retinopathy.  OD   Procedures Next Due Date:    Diabetic Eye Exam: 12/2009

## 2010-05-31 NOTE — Assessment & Plan Note (Signed)
Summary: Pulmonary/ ext ov with walking sats = 85% on 4lpm p 185 ft   Primary Provider/Referring Provider:  Junius Finner  MD  CC:  Pt states has been w/o Furosemide since Saturday and is c/o increased SOB. Pt states outpt clinic advised her to follow up with Dr. [name] for Sacoidosis.  History of Present Illness: 40 yobf quit smoking July 2011 p dx of sarcoidosis in 2001 = sob, cough  became 02 dep  in July 2011 last off prednsione March 2011 but daily since then and complicated by [facility] detected   January 06, 2010 Pt states has been w/o Furosemide since  9/3  and is c/o increased SOB. Pt states outpt clinic advised her to follow up with Dr. [name] for Sacoidosis. doe on 02 at 2 lpm sitting then 4 with activity gets off at the curb struggles with grocery store.  Pt denies any significant sore throat, dysphagia, itching, sneezing,  nasal congestion or excess secretions,  fever, chills, sweats, unintended wt loss, pleuritic or exertional cp, hempoptysis, variability  in activity tolerance  orthopnea pnd or leg swelling.  Pt also denies any obvious fluctuation in symptoms with weather or environmental change or other alleviating or aggravating factors.       Preventive Screening-Counseling & Management  Alcohol-Tobacco     Alcohol drinks/day: 0     Smoking Status: quit     Smoking Cessation Counseling: yes     Packs/Day: 1/3     Year Started: 25+ years  Current Medications (verified): 1)  Metformin Hcl 1000 Mg Tabs (Metformin Hcl) .... Take 1 Tablet By Mouth Two Times A Day 2)  Albuterol 90 Mcg/act Aers (Albuterol) .... Inhale 2 Puffs Four Times A Day As Needed 3)  Calcium 500/d 500-200 Mg-Unit Tabs (Calcium Carbonate-Vitamin D) .... Take 1 Tablet By Mouth Three Times A Day 4)  Omeprazole 20 Mg Cpdr (Omeprazole) .... Take 1 Tablet By Mouth Two Times A Day 5)  Novolin 70/30 70-30 % Susp (Insulin Isophane & Regular) .... Inject 35 Units 30 Minutes Before Breakfast and 40 Units 30 Minutes Before  Evening Meal. 6)  Insulin Syringe/needle 28g X 1/2" 0.5 Ml Misc (Insulin Syringe-Needle U-100) .... Per Instruction 7)  Freestyle Lite Test  Strp (Glucose Blood) .... Use To Check Blood Sugar 3x Daily 8)  Lancets  Misc (Lancets) .... Use To Check Blood Sugar Three Times Daily 9)  Prednisone 20 Mg Tabs (Prednisone) .... Take 1/2 Tablet By Mouth Once A Day 10)  Ferrous Sulfate 325 (65 Fe) Mg Tabs (Ferrous Sulfate) .... Take 1 Tablet By Mouth Two Times A Day 11)  Nifedipine 60 Mg Xr24h-Tab (Nifedipine) .... Take 1 Tablet By Mouth Two Times A Day 12)  Furosemide 40 Mg Tabs (Furosemide) .Marland Kitchen.. 1 Tablet By Mouth Daily  Allergies (verified): No Known Drug Allergies  Past History:  Past Medical History: Sarcoidosis (Dr Mehreen Azizi)-w/ liver involvement per biopsy 12/09 Unexplained Hypoxemia July 2011     - CT angiogram 01/07/10 >>> QT prolongation Diabetes mellitus, type II Hypertension Heart catheterization 06-06-06 : No CAD, no RAS, normal EF Hx eclampsia Hx of scalp seborrheic dermatitis Abnormal LFT's:   Alk phos 713, TB 1.3, AST 52, ALT 18 in 11/09- Liver u/s and exam c/w HSM.  Hep B                               serology neg but Hep C ab +, HIV neg.  Coags slightly prolonged and alb low.  Started                                             empirically on prednisone for GI sarcoid and tapering.  AMA and Hep C viral load neg.                 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 (neg complement, ANA, etc 12/10) Diabetic retinopathy-R eye 02/11  Family History: Children-healthy Mom-Colon ca Father-MS Asthma is sister in childhood   Vital Signs:  Patient profile:   40 year old female Weight:      238.25 pounds O2 Sat:      93 % on 2 L/min Temp:     98.8 degrees F oral Pulse rate:   112 / minute BP sitting:   120 / 70  (right arm) Cuff size:    large  Vitals Entered By: Iran Planas CMA (January 06, 2010 9:46 AM)  O2 Flow:  2 L/min  O2 Sat Comments Upon arrival pt O2 79% RA, pt recovered to 93% 2 liters Iran Planas CMA  January 06, 2010 9:56 AM   Serial Vital Signs/Assessments:  Comments: Ambulatory Pulse Oximetry  Resting; HR__105___    02 Sat__98%4 liters___  Lap1 (185 feet)   HR__156___   02 Sat__94% 4 liters___ Lap2 (185 feet)   HR__116___   02 Sat__85% 4 liters___    Lap3 (185 feet)   HR_____   02 Sat_____  ___Test Completed without Difficulty _x__Test Stopped due to: pt desat to 85% 4liters at end of 2nd lap. Pt recovered to 96% 4liters at end of walk. Iran Planas CMA  January 06, 2010 11:07 AM    By: Iran Planas CMA   CC: Pt states has been w/o Furosemide since Saturday and is c/o increased SOB. Pt states outpt clinic advised her to follow up with Dr. [name] for Sacoidosis Comments Medications reviewed with patient Verified contact number and pharmacy with patient Iran Planas CMA  January 06, 2010 9:46 AM    Physical Exam  Additional Exam:  She is a cushingoid-appearing female wt 234 > 238 January 06, 2010  There is no stigmata liver disease skin: anicteric HEENT: normocephalic; PEERLA; no nasal or pharyngeal abnormalities neck: supple nodes: no cervical lymphadenopathy chest: clear to ausculatation and percussion heart: no murmurs, gallops, or rubs abd: soft, nontender; BS normoactive; no abdominal masses, tenderness, organomegaly; abdomen is obese rectal: deferred ext: no cynanosis, clubbing, edema skeletal: no deformities neuro: oriented x 3; no focal abnormalities    Impression & Recommendations:  Problem # 1:  SARCOIDOSIS (ICD-135) No evidence of active disease by CT without contrast from 10/2009.  The goal with a chronic steroid dependent illness is always arriving at the lowest effective dose that controls the disease/symptoms and not accepting a set  "formula" which is based on statistics that don't take into accound individual variability or the natural hx of the dz in every individual patient, which may well vary over time.  Will ask her to try every other day dosing to see if there is any evidence at all of benefit  Problem # 2:  RESPIRATORY FAILURE, ACUTE (ICD-518.81)  needs repeat CT this time with contrast to exclude occult thromboembolic dz  Orders: Est. Patient Level IV [id number])  Problem # 3:  HYPERTENSION (ICD-401.9)  The following medications were removed from the medication list:    Nifedipine 60 Mg Xr24h-tab (Nifedipine) .Marland Kitchen... Take 1 tablet by mouth two times a day Her updated medication list for this problem includes:    Furosemide 40 Mg Tabs (Furosemide) .Marland Kitchen... 1 tablet by mouth daily    Cardizem La 240 Mg Xr24h-tab (Diltiazem hcl coated beads) ..... One daily  Nifedipine poor choice for ccb in pt with nl ef and marked shunting as it tends to interfere with Hypoxic pulmonary vasoconstriction - will try cardizem with low threshold to shift to verapamil or even bystolic here.  Medications Added to Medication List This Visit: 1)  Omeprazole 20 Mg Cpdr (Omeprazole) .... Take one 30-60 min before first and last meals of the day 2)  Prednisone 20 Mg Tabs (Prednisone) .... Take 1/2 tablet by mouth once a day 3)  Nifedipine 60 Mg Xr24h-tab (Nifedipine) .... Take 1 tablet by mouth two times a day 4)  Cardizem La 240 Mg Xr24h-tab (Diltiazem hcl coated beads) .... One daily  Other Orders: Misc. Referral (Misc. Ref)  Patient Instructions: 1)  Try taking Prednisone one half even days in am with breakfast 2)  Be sure you take omeprazole Take one 30-60 min before first and last meals of the day 3)  Stop nifedipine 4)  Start Cardizem 240 mg once daily 5)  Wear 24 hours per day, 2 lpm at rest and sleeping, 4lpm with activity, this is the best way to help your heart function better 6)  Please schedule a follow-up appointment in 4  weeks, sooner if needed with PFT's on return 7)  See Patient Care Coordinator before leaving for CT Chest (needs to be repeated with contrast) Prescriptions: CARDIZEM LA 240 MG XR24H-TAB (DILTIAZEM HCL COATED BEADS) one daily  #30 x 11   Entered and Authorized by:   Tanda Rockers MD   Signed by:   Tanda Rockers MD on 01/06/2010   Method used:   Print then Give to Patient   RxID:   [id number]     Immunization History:  Influenza Immunization History:    Influenza:  historical (03/01/2009)  Pneumovax Immunization History:    Pneumovax:  historical (03/01/2009)

## 2010-05-31 NOTE — Letter (Signed)
Summary: LOG BOOK   LOG BOOK   Imported By: Garlan Fillers 01/20/2010 15:53:21  _____________________________________________________________________  External Attachment:    Type:   Image     Comment:   External Document

## 2010-05-31 NOTE — Progress Notes (Signed)
  Phone Note Outgoing Call   Call placed by: Junius Finner  MD,  May 28, 2008 4:18 PM Call placed to: Patient Summary of Call: Called pt to let her know I spoke w/ Dr. Deatra Ina and he would like to see her back in the next week or two and to call his office for an appt.  She states she is feeling better and will call his office for an appt.  F/u of GI sarcoidosis per Dr. Deatra Ina.  May need EGD at some point b/c of hepatosplenomegaly. Initial call taken by: Junius Finner  MD,  May 28, 2008 4:18 PM

## 2010-05-31 NOTE — Miscellaneous (Signed)
Summary: HIV Test  HIV Test   Imported By: Bonner Puna 03/17/2008 15:29:17  _____________________________________________________________________  External Attachment:    Type:   Image     Comment:   External Document

## 2010-05-31 NOTE — Assessment & Plan Note (Signed)
Summary: NEED MEDICATION/ SB.   Vital Signs:  Patient profile:   40 year old female Height:      61 inches (154.94 cm) Weight:      203.3 pounds (92.41 kg) BMI:     38.55 Temp:     97.2 degrees F (36.22 degrees C) oral Pulse rate:   96 / minute BP sitting:   121 / 78  (right arm)  Vitals Entered By: Silverio Decamp NT II (January 21, 2009 11:02 AM) Is Patient Diabetic? Yes  Pain Assessment Patient in pain? no      Nutritional Status BMI of > 30 = obese  Have you ever been in a relationship where you felt threatened, hurt or afraid?No   Does patient need assistance? 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 She has been on prednisone 40 mg once daily and has run out of metformin about a week, her prednisone is almost run off. She usually checked her CBg once a week and it runs about 300->500 since this April. She has no cp, SOB, AMS, vision change but urenary frequency and polydipsia, no dysuria. She has good appetite, no N/V/D, lost about 10 lbs since last visit 3 months ago intentionally. She is current smoker, about 5-8 cigarretes a day, denies ETOHor drug.   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 Take 4 Tabs By Mouth Daily or As Directed 2)  Metformin Hcl 500 Mg Tabs (Metformin Hcl) .... Take 1 Tablet By Mouth Two Times A Day 3)  Albuterol 90 Mcg/act Aers (Albuterol) .... Inhale 2 Puffs Four Times A Day As Needed 4)  Calcium 500/d 500-200 Mg-Unit Tabs (Calcium Carbonate-Vitamin D) .... Take 1 Tablet By Mouth Three Times A Day 5)  Omeprazole 20 Mg Cpdr (Omeprazole) .... Take 1 Tablet By Mouth Two Times A Day  Current Medications (verified): 1)  Prednisone 10 Mg Tabs (Prednisone) .... Take 3 Tabs By Mouth Daily For Now 2)  Metformin Hcl 500 Mg Tabs (Metformin Hcl) .... Take 1 Tablet By Mouth Two Times A Day 3)  Albuterol 90 Mcg/act Aers (Albuterol) .... Inhale 2 Puffs Four Times A Day As Needed 4)  Calcium 500/d 500-200 Mg-Unit Tabs (Calcium Carbonate-Vitamin D) .... Take 1 Tablet By Mouth Three Times A Day 5)  Omeprazole 20 Mg Cpdr (Omeprazole) .Marland KitchenMarland KitchenMarland Kitchen  Take 1 Tablet By Mouth Two Times A Day 6)  Lantus 100 Unit/ml Soln (Insulin Glargine) .... 20 Units Subcutaneously At Bedtime Daily 7)  Insulin Syringe/needle 28g X 1/2" 0.5 Ml Misc (Insulin Syringe-Needle U-100) .... Per Instruction  Allergies (verified): No Known Drug Allergies  Past History:  Past Medical History: Last updated: 05/22/2008 Sarcoidosis (Dr Melvyn Novas) QT prolongation Diabetes mellitus, type II Hypertension Heart catheterization 06-06-06 : No CAD, no RAS, normal EF Hx eclampsia Hx of scalp seborrheic dermatitis Abnormal LFT's:   Alk phos 713, TB 1.3, AST 52, ALT 18 in 11/09- Liver u/s and exam c/w HSM.  Hep B                             serology neg but Hep C ab +, HIV neg.   Coags slightly prolonged and alb low.  Started                                           empirically on prednisone for GI sarcoid.  AMA and Hep C viral load neg.                 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  Family History: Last updated: 03/11/2008 Children-healthy Mom-Colon ca Merrily Pew  Social History: Last updated: 03/11/2008 She is single, two children that are healthy.  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  She denies illegal drugs and etoh use.    Risk Factors: Alcohol Use: 0 (01/21/2009) Exercise: no (01/21/2009)  Risk Factors: Smoking Status: current (01/21/2009) Packs/Day: 1/3 (01/21/2009)  Family History: Reviewed history from 03/11/2008 and no changes required. Children-healthy Mom-Colon ca [employer]  Social History: Reviewed history from 03/11/2008 and no changes required. She is single, two children that are healthy.  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  She denies illegal drugs and etoh use.    Review of Systems  The patient denies anorexia, fever, weight gain, vision loss, decreased hearing, chest pain, syncope, dyspnea on exertion, peripheral edema, headaches, hemoptysis, abdominal pain, and melena.    Physical Exam  General:  alert, well-developed, well-nourished, well-hydrated, and overweight-appearing.   Head:  normocephalic.   Eyes:  vision grossly intact, pupils equal, pupils round, and pupils reactive to light.   Ears:  no external deformities.   Nose:  no nasal discharge.   Mouth:  pharynx pink and moist.   Neck:  supple.   Lungs:  normal respiratory effort, normal breath sounds, no crackles, and no wheezes.   Heart:  normal rate, regular rhythm, no murmur, and no JVD.   Abdomen:  soft, non-tender, normal bowel sounds, no distention, and no masses.   Msk:  normal ROM, no joint tenderness, no joint swelling, no joint warmth, and no redness over joints.   Pulses:  2+ Extremities:  No  edema.  Neurologic:  alert & oriented X3, cranial nerves II-XII intact, strength normal in all extremities, sensation intact to light touch, sensation intact to pinprick, gait normal, and DTRs symmetrical and normal.     Impression & Recommendations:  Problem # 1:  SARCOIDOSIS (ICD-135) Assessment Comment Only She has been on prednisone 40 mg since this April. As she has CBG >500, will taper her prednisone to 30 mg once daily for one month and have  pumonary referral in 2 weeks to evaluate and manage her sarcoidosis as [facility] GI doctor in [facility] would like to taper her prednisone if pulmonary doctors think it is fine too. As she has worsening DM with high CBg >500 and A1C>14, check CMET to r/o DKA or HHS.   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 Her A1C goes from 7.6 in 09/2008  to >14 today. Her DM is steriod-induced, will paper prednisone to 30 mg and start lantus 20 units subcutaneous injection at bedtime daily besides metformin, needs to check CBG at least three times a week. Will check CMET to r/o DKA or HHS and has diabetes referral to [facility] for DM education. Today her CMET show Na 138, Bicarb 28, glucose 708, the calculated serum osmolality 320. Has discussed with Dr. Stann Mainland regarding to hyperglycemia. Because she has no AMS, N/V except polyuria and polydipsia, has hyperglycemia for several months, will not admit her today, asked her to get and take her medications today, including meformin and lantus.  Will follow up tomorrow by calling her, if she has AMS with N/V and CBG does not fall, will admit her to the hospital. She verbally understands these and would like to check her CBG daily.   Orders: T- Capillary Blood Glucose [id number]) T-Hgb A1C (in-house) [id number]) T-Comprehensive Metabolic Panel ([id number]) Diabetic Clinic Referral  (Diabetic)  Problem # 3:  HYPERTENSION (ICD-401.9) Assessment: Improved At goal, not on medications.  Orders: T-Comprehensive Metabolic Panel ([id number])  BP today: 121/78 Prior BP: 146/90 (11/23/2008)  Labs Reviewed: K+: 3.7 (08/19/2008) Creat: : 0.84 (08/19/2008)   Chol: 171 (07/28/2008)   HDL: 26 (07/28/2008)   LDL: 115 (07/28/2008)   TG: 151 (07/28/2008)  Problem # 4:  Preventive Health Care (ICD-V70.0) Assessment: Comment Only Will check mammogram which is due and she would like to do it.   Complete Medication List: 1)  Prednisone 10 Mg Tabs (Prednisone) .... Take 3 tabs by mouth daily for now 2)  Metformin Hcl 500 Mg Tabs (Metformin hcl) .... Take 1 tablet by mouth two times a day 3)  Albuterol 90 Mcg/act Aers (Albuterol) .... Inhale 2 puffs four times a day as needed 4)  Calcium 500/d 500-200 Mg-unit Tabs (Calcium carbonate-vitamin d) .... Take 1 tablet by mouth three times a day 5)  Omeprazole 20 Mg Cpdr (Omeprazole) .... Take 1 tablet by mouth two times a day 6)  Lantus 100 Unit/ml Soln (Insulin glargine) .... 20 units subcutaneously at bedtime daily 7)  Insulin Syringe/needle 28g X 1/2" 0.5 Ml Misc (Insulin syringe-needle u-100) .... Per instruction  Other Orders: T-CBC w/Diff [id number]) T-Lipid Profile [id number]) Mammogram (Screening) (Mammo)  Patient Instructions: 1)  Please schedule a follow-up appointment in 2-3 weeks. 2)  Please take all the medications as instructed.  3)  We will call you if any abnormal lab or need admission.  4)  Please check your blood sugar at least three times a day.  Prescriptions: INSULIN SYRINGE/NEEDLE 28G X 1/2" 0.5 ML MISC (INSULIN SYRINGE-NEEDLE U-100) per instruction  #100 x 5   Entered and Authorized by:   Geanie Kenning MD   Signed by:   Geanie Kenning MD on 01/21/2009   Method used:   Faxed to ...       [facility] (retail)       [address]  [place], [place]  [address]       Ph:  [phone]       Fax: [phone]   RxID:   [id number] METFORMIN HCL 500 MG TABS (METFORMIN HCL) Take 1 tablet by mouth two times a day  #60 x 3   Entered and Authorized by:   Geanie Kenning MD   Signed by:   Geanie Kenning MD on 01/21/2009   Method used:   Print then Give to Patient   RxID:   [id number] LANTUS 100 UNIT/ML SOLN (INSULIN GLARGINE) 20 units subcutaneously at bedtime daily  #1 x 3   Entered and Authorized by:   Geanie Kenning MD   Signed by:   Geanie Kenning MD on 01/21/2009   Method used:   Print then Give to Patient   RxID:   [id number] PREDNISONE 10 MG TABS (PREDNISONE) Take 3 tabs by mouth daily for now  #90 x 3   Entered and Authorized by:   Geanie Kenning MD   Signed by:   Geanie Kenning MD on 01/21/2009   Method used:   Print then Give to Patient   RxID:   [id number]   Process Orders Check Orders Results:     [facility]: ABN not required for this insurance Tests Sent for requisitioning (January 21, 2009 3:55 PM):     01/21/2009: [facility] -- T-Comprehensive Metabolic Panel [id number] (signed)     01/21/2009: [facility] -- T-CBC w/Diff [id number] (signed)     01/21/2009: [facility] -- T-Lipid Profile [id number] (signed)    Prevention & Chronic Care Immunizations   Influenza vaccine: Fluvax 3+  (04/08/2008)   Influenza vaccine deferral: Not indicated  (01/21/2009)    Tetanus booster: Not documented    Pneumococcal vaccine: Not documented  Other Screening   Pap smear: NEGATIVE FOR INTRAEPITHELIAL LESIONS OR MALIGNANCY.  (03/11/2008)    Mammogram: Not documented   Mammogram action/deferral: Ordered  (01/21/2009)   Smoking status: current  (01/21/2009)   Smoking cessation counseling: yes  (01/21/2009)  Lipids   Total Cholesterol: 171  (07/28/2008)   Lipid panel action/deferral: Lipid Panel ordered   LDL: 115  (07/28/2008)   LDL Direct: Not  documented   HDL: 26  (07/28/2008)   Triglycerides: 151  (07/28/2008)  Hypertension   Last Blood Pressure: 121 / 78  (01/21/2009)   Serum creatinine: 0.84  (08/19/2008)   Serum potassium 3.7  (08/19/2008) CMP ordered     Hypertension flowsheet reviewed?: Yes   Progress toward BP goal: At goal  Self-Management Support :   Personal Goals (by the next clinic visit) :      Personal blood pressure goal: 130/80  (01/21/2009)   Patient will work on the following items until the next clinic visit to reach self-care goals:     Medications and monitoring: check my blood sugar  (01/21/2009)     Eating: drink diet soda or water instead of juice or soda, eat more vegetables  (01/21/2009)     Activity: take a 30 minute walk every day  (11/23/2008)    Hypertension self-management support: Written self-care plan  (01/21/2009)   Hypertension self-care plan printed.   Nursing Instructions: Schedule screening mammogram (see order)      Process Orders Check Orders Results:     [facility]: ABN not required for this insurance Tests Sent for requisitioning (January 21, 2009 3:55 PM):     01/21/2009: [facility] -- T-Comprehensive Metabolic Panel [id number] (signed)  01/21/2009: [facility] -- T-CBC w/Diff [id number] (signed)     01/21/2009: [facility] -- T-Lipid Profile [id number] (signed)   Laboratory Results   Blood Tests   Date/Time Received: January 21, 2009 11:47 AM  Date/Time Reported: Melvia Heaps  January 21, 2009 11:48 AM   HGBA1C: >14.0%   (Normal Range: Non-Diabetic - 3-6%   Control Diabetic - 6-8%)  Comments: CBG Critical High Result Given to [facility] NT and Dr. Bonney Aid Labs drawn and sent to [place]  January 21, 2009 11:49 AM

## 2010-05-31 NOTE — Progress Notes (Signed)
Summary: CBGs high/dmr  Phone Note Call from Patient Call back at Home Phone [phone]   Caller: Patient Summary of Call: patient call for help with knowing what to eat to keep her blood sugar down while on prednisone. knows what to eat to not raise it, please call @ 4 pm at [phone] Initial call taken by: Barnabas Harries RD,CDE,  December 18, 2008 4:30 PM  Follow-up for Phone Call        CBG 372 this am fasting, yesterday meter read HI, feet swollen so bad- thinks it has to do with her sugar. stated back to school this week- feet really started swellinghtis week, started feeling bad this week- aching and feeling bad- eyes feel sleepy- not tired untll yesterday and today. told her to eat lean meat, lot's of water nad nonstarchy veggies will send this to attending and told her we'd get back to her.  Follow-up by: Barnabas Harries RD,CDE,  December 18, 2008 4:27 PM  Additional Follow-up for Phone Call Additional follow up Details #1::        Tell pt to take metformin two times a day starting today. Come to see Butch Penny on Monday and we will work her in to see an MD as well.  Additional Follow-up by: Efraim Kaufmann MD,  December 18, 2008 4:42 PM    Additional Follow-up for Phone Call Additional follow up Details #2::    Patient comin gin Monday to see CDE at 1 PM, have sent a note to traige about appointment with docotor. Patien verbalized understandingn to new metformin dose fo 500 mg twice daily, she just checked her blood sugar and it was 559. reiterated drinking lot's of water, eating only lean meats and non starchy veggies. . Follow-up by: Barnabas Harries RD,CDE,  December 18, 2008 4:48 PM  Additional Follow-up for Phone Call Additional follow up Details #3:: Details for Additional Follow-up Action Taken: spoke w/ pt she verb understanding of metformin dose twice daily and to come to clinic mon for appt w/ d riley and will be worked in to see md Additional Follow-up by: Freddy Finner RN,  December 18, 2008  5:52 PM  New/Updated Medications: METFORMIN HCL 500 MG TABS (METFORMIN HCL) Take 1 tablet by mouth two times a day

## 2010-05-31 NOTE — Letter (Signed)
Summary: DIABETES-LOG BOOK REPORT 07/11-08/12/2009  DIABETES-LOG BOOK REPORT 07/11-08/12/2009   Imported By: Enedina Finner 12/08/2009 16:15:31  _____________________________________________________________________  External Attachment:    Type:   Image     Comment:   External Document

## 2010-05-31 NOTE — Miscellaneous (Signed)
Summary: HIPAA Restrictions  HIPAA Restrictions   Imported By: Bonner Puna 03/25/2008 15:34:56  _____________________________________________________________________  External Attachment:    Type:   Image     Comment:   External Document

## 2010-05-31 NOTE — Progress Notes (Signed)
  Phone Note Outgoing Call   Call placed by: Soc. Work Architectural technologist of Call: Patient not at home.  Young girl and uncle answered phone.  Will send letter to home.   Follow-up for Phone Call        Mailed letter to home to call SW  Katy Fitch  December 13, 2009 12:10 PM

## 2010-05-31 NOTE — Progress Notes (Signed)
Summary: refills/ hla  Phone Note Refill Request Message from:  Pharmacy on April 16, 2009 4:13 PM  Refills Requested: Medication #1:  PREDNISONE 10 MG TABS Take two tablets by mouth once a day for two weeks and then decrease to 1 and a 1/2 tablets by  mouth once a day.  Medication #2:  METFORMIN HCL 1000 MG TABS Take 1 tablet by mouth two times a day  Medication #3:  OMEPRAZOLE 20 MG CPDR Take 1 tablet by mouth two times a day  Medication #4:  NOVOLIN N 100 UNIT/ML SUSP Inject 25 units twice daily. Initial call taken by: Freddy Finner RN,  April 16, 2009 4:13 PM  Follow-up for Phone Call        Rx faxed to pharmacy Follow-up by: Sander Nephew RN,  April 21, 2009 9:54 AM    New/Updated Medications: PREDNISONE 10 MG TABS (PREDNISONE) Take 1 and a 1/2 tablets by  mouth once a day. Prescriptions: PREDNISONE 10 MG TABS (PREDNISONE) Take 1 and a 1/2 tablets by  mouth once a day.  #45 x 0   Entered and Authorized by:   Junius Finner  MD   Signed by:   Junius Finner  MD on 04/19/2009   Method used:   Telephoned to ...       [facility] (retail)       [place], [place]  [address]       Ph: [phone]       Fax: [phone]   RxID:   [id number] NOVOLIN N 100 UNIT/ML SUSP (INSULIN ISOPHANE HUMAN) Inject 25 units twice daily.  #3 vials x prn   Entered and Authorized by:   Junius Finner  MD   Signed by:   Junius Finner  MD on 04/19/2009   Method used:   Telephoned to ...       [facility] (retail)       [place], [place]  [address]       Ph: [phone]       Fax: [phone]   RxID:   [id number] OMEPRAZOLE 20 MG CPDR (OMEPRAZOLE) Take 1 tablet by mouth two times a day  #60 x 6   Entered and Authorized by:   Junius Finner  MD   Signed by:   Junius Finner  MD on 04/19/2009   Method used:   Telephoned to ...       [facility] (retail)       [place], [place]  [address]       Ph: [phone]       Fax: [phone]   RxID:   [id number] METFORMIN HCL 1000 MG TABS (METFORMIN HCL) Take 1 tablet by mouth two times a day  #60 x 3   Entered and Authorized by:   Junius Finner  MD   Signed by:   Junius Finner  MD on 04/19/2009   Method used:   Telephoned to ...       [facility] (retail)       [address] [place],   [address]       Ph: [phone]       Fax: [phone]   RxID:   [id number]

## 2010-05-31 NOTE — Assessment & Plan Note (Signed)
Summary: HFU-NO LABS SCH/CFB   Vital Signs:  Patient profile:   40 year old female Height:      61 inches (154.94 cm) Weight:      202.5 pounds (92.05 kg) BMI:     38.40 Temp:     99.1 degrees F oral Pulse rate:   113 / minute BP sitting:   139 / 94  (right arm)  Vitals Entered By: Morrison Old RN (August 19, 2008 3:06 PM) CC: Hospital f/u - "feeling tired" Is Patient Diabetic? No Pain Assessment Patient in pain? yes     Location: BLE'S Intensity: 4 Type: aching Onset of pain  With activity Nutritional Status BMI of > 30 = obese  Have you ever been in a relationship where you felt threatened, hurt or afraid?No   Does patient need assistance? Functional Status Self care Ambulation Normal   Primary Care Provider:  Junius Finner  MD  CC:  Hospital f/u - "feeling tired".  History of Present Illness: Melody Gray is a 40 yo woman with PMH as outlined in chart.  She is here today for hospital follow up.  States she is still tired, legs still swelling, generalized aches that are mainly in the morning all of which are old.  Doesn't really feel different after increase in steroids during hospitalization.    Of note, she is taking 20mg  of prednisone, not 40mg .  Her bottle is for 10mg  and states to take 2 tabs daily.  D/c summary indicates 40mg  and med list shows 4 tabs, 10mg  each.  She has not yet seen Dr. Deatra Ina or Dr. Melvyn Novas.  Tolerating by mouth intake adequately, only problem is early satiety but not nausea/vomiting/abdominal pain.    Preventive Screening-Counseling & Management     Alcohol drinks/day: 0     Smoking Status: current     Smoking Cessation Counseling: yes     Packs/Day: 1/3     Year Started: 25+ years     Does Patient Exercise: no  Medications Prior to Update: 1)  Albuterol 90 Mcg/act Aers (Albuterol) .... Inhale 2 Puffs Four Times A Day As Needed 2)  Prednisone 10 Mg Tabs (Prednisone) .... Take 4 Tabs By Mouth Daily or As Directed 3)  Calcium 500/d 500-200  Mg-Unit Tabs (Calcium Carbonate-Vitamin D) .... Take 1 Tablet By Mouth Three Times A Day 4)  Omeprazole 20 Mg Cpdr (Omeprazole) .... Take 1 Tablet By Mouth Two Times A Day 5)  Flonase 50 Mcg/act Susp (Fluticasone Propionate) .... 2 Sprays in Each Nostril Daily  Allergies: No Known Drug Allergies  Past History:  Past Medical History:    Sarcoidosis (Dr Melvyn Novas)    QT prolongation    Diabetes mellitus, type II    Hypertension    Heart catheterization 06-06-06 : No CAD, no RAS, normal EF    Hx eclampsia    Hx of scalp seborrheic dermatitis    Abnormal LFT's:   Alk phos 713, TB 1.3, AST 52, ALT 18 in 11/09- Liver u/s and exam c/w HSM.  Hep B                             serology neg but Hep C ab +, HIV neg.  Coags slightly prolonged and alb low.  Started  empirically on prednisone for GI sarcoid.  AMA and Hep C viral load neg.                    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 (05/22/2008)  Risk Factors:    Alcohol Use: 0 (08/19/2008)    >5 drinks/d w/in last 3 months: N/A    Caffeine Use: N/A    Diet: N/A    Exercise: no (08/19/2008)  Review of Systems      See HPI  Physical Exam  General:  overweight-appearing.  NAD Eyes:  pupils equal, pupils round, and pupils reactive to light.  anicteric Lungs:  normal respiratory effort, no accessory muscle use, normal breath sounds, no crackles, and no wheezes.   Heart:  borderline tachy rate, regular rhythm, no murmur, no gallop, no rub, and no JVD.   Abdomen:  obese, soft, non-tender, and normal bowel sounds.  Marked hapatosplenomegaly (liver edge down to iliac crest) Neurologic:  alert & oriented X3, cranial nerves II-XII intact, strength normal in all extremities, and gait normal.     Impression & Recommendations:  Problem # 1:  SARCOIDOSIS (ICD-135) Pt apparently has stable hilar adenopathy, but has active liver involvement. Will increase prednisone to 40mg  as inteded  upon discharge. Will set up appointments with Dr. Deatra Ina and Dr. Melvyn Novas for further management options.  Refer to [facility] in order to get established with anticipation of ? need for transplant in future? Check LFTs and coag to assess stability.  Orders: T-Comprehensive Metabolic Panel ([id number]) T-Protime, Auto [id number]) Pulmonary Referral (Pulmonary) Gastroenterology Referral (GI)  Problem # 2:  TACHYCARDIA (ICD-785.0) Reveiwed patients heart rate trend. She has been borderline tachy since 04/2007. Will discuss with PCP as the treatment is that of the underlying cause.      No significant anemia      TSH is wnl      Pheochromocytoma is highly unlikely      No fever      No volume depletion / hypotension      No hypoxia May need to consider AV nodal agent  to avoid tachycardia induced cardiomyopathy, although this is a rare entity and likely related to faster rhythms.  Complete Medication List: 1)  Albuterol 90 Mcg/act Aers (Albuterol) .... Inhale 2 puffs four times a day as needed 2)  Prednisone 10 Mg Tabs (Prednisone) .... Take 4 tabs by mouth daily or as directed 3)  Calcium 500/d 500-200 Mg-unit Tabs (Calcium carbonate-vitamin d) .... Take 1 tablet by mouth three times a day 4)  Omeprazole 20 Mg Cpdr (Omeprazole) .... Take 1 tablet by mouth two times a day 5)  Flonase 50 Mcg/act Susp (Fluticasone propionate) .... 2 sprays in each nostril daily  Patient Instructions: 1)  Please schedule a follow-up appointment in 1 month, please schedule with Dr. Redmond Pulling at next available. 2)  Will schedule appointments with Dr. Deatra Ina, Dr. Melvyn Novas and [facility]. 3)  Will check some labs, and if any change from prior labs, we will call you. 4)  Increase your prednisone to 40mg  (4 tablets) daily, the prescription is in the pharmacy.   5)  If you have any problems before your next visit, call for an appointment.  Appended Document: HFU-NO LABS SCH/CFB Pt.'s info.  sent to [facility] 08/21/2008 per Dr. Philbert Riser.  I called them today; the person I spoke to stated she has been referred to the office here in [place].

## 2010-05-31 NOTE — Progress Notes (Signed)
Summary: Soc. Work  Dealer placed by: Soc. Work Call placed to: Patient Summary of Call: Called patient re: Disability letter of support.   She said OK to mail to her at address on chart.   Also said she was denied disability because she was working too many hours.   I have encouraged her to file for reconsideration anyway because of medical issues.

## 2010-05-31 NOTE — Progress Notes (Signed)
----   Converted from flag ---- ---- 04/15/2009 10:11 AM, Morrison Old RN wrote: I will forward this to Katy Fitch.  ---- 04/15/2009 9:50 AM, Junius Finner  MD wrote: Do you know how much they will charge her?  Perhaps we can access the [facility] through Loree Fee  ---- 04/14/2009 11:42 AM, Morrison Old RN wrote: [facility] was called and she gave me the # to [place] and she said she would call.  ---- 04/13/2009 4:12 PM, Junius Finner  MD wrote: How much would our opthamologist here in [place] charge?  Mateo Flow  ---- 04/13/2009 4:11 PM, Morrison Old RN wrote: [id number] requires $150.00 with her appt. I tried calling pt.; unavailable or out of service.  ---- 04/12/2009 4:57 PM, Junius Finner  MD wrote: Holley Raring, Can you please see if you can get her into [facility] with opthamology for eye exam to evaluate for diabetic retinopathy and eye involvement of sarcoidosis?  She has no insurance and may be able to be seen at [facility] and said she would go to [place] if she needed to.  She needs to be seen in the next few weeks if possible. Thanks! Mateo Flow ------------------------------

## 2010-05-31 NOTE — Progress Notes (Signed)
Summary: Soc. Work  Dealer placed by: Soc. Work Call placed to: [facility] Summary of Call: Dr. Nehemiah Massed is on call.  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   Marlowe Kays said they can see patient tomorrow at 1:45.  I have given all info to [facility] and she will fax over office notes, med/problem list to [facility] at 1:45.   Fax is [phone].

## 2010-05-31 NOTE — Medication Information (Signed)
Summary: [facility]: RX  [facility]: RX   Imported By: Bonner Puna 03/05/2009 12:03:57  _____________________________________________________________________  External Attachment:    Type:   Image     Comment:   External Document

## 2010-05-31 NOTE — Procedures (Signed)
Summary: [facility]  [facility]   Imported By: Bonner Puna 05/05/2008 16:00:58  _____________________________________________________________________  External Attachment:    Type:   Image     Comment:   External Document

## 2010-05-31 NOTE — Assessment & Plan Note (Signed)
Summary: 85MONTH FU/WILSON/CFB   Vital Signs:  Patient profile:   40 year old female Height:      61 inches (154.94 cm) Weight:      201.8 pounds (91.73 kg) BMI:     38.27 Pulse rate:   118 / minute BP sitting:   130 / 80  (left arm) Cuff size:   large  Vitals Entered By: Lucky Rathke NT II (July 27, 2008 4:36 PM) Is Patient Diabetic? No Pain Assessment Patient in pain? no      Nutritional Status BMI of > 30 = obese  Have you ever been in a relationship where you felt threatened, hurt or afraid?No   Does patient need assistance? Functional Status Self care Ambulation Normal Comments FOLLOW UP ON MEDICATION REFILL /  STILL HAVING RIGHT SIDE ABD TENDERNESS   Primary Care Provider:  Junius Finner  MD   History of Present Illness: This is a 40   y/o woman with PMH of   Sarcoidosis (Dr Melvyn Novas) QT prolongation Diabetes mellitus, type II Hypertension Heart catheterization 06-06-06 : No CAD, no RAS, normal EF Hx eclampsia Hx of scalp seborrheic dermatitis Abnormal LFT's:   Alk phos 713, TB 1.3, AST 52, ALT 18 in 11/09- Liver u/s and exam c/w HSM.  Hep B                             serology neg but Hep C ab +, HIV neg.  Coags slightly prolonged and alb low.  Started                                           empirically on prednisone for GI sarcoid.  AMA and Hep C viral load neg.                 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  Coming for a checkup - she had SOB at last check a month ago - Flonase started. Breathing better on Flonase.  She feels that her liver is swollen, she has tightness over her liver and also tenderness. She cannot eat a regular meal as she feels very full. She was tapered from 20 mg Prednisone to 10 mg at last check a month ago, right before these spx started. She had this before and she usually gets her Prednisone increased with these spx.  No N/V/D - has constipation, No Cp/Palpitations/no blood in stool.  Preventive  Screening-Counseling & Management     Smoking Status: current     Smoking Cessation Counseling: yes     Packs/Day: 1/3     Year Started: 25+ years     Does Patient Exercise: no  Current Medications (verified): 1)  Albuterol 90 Mcg/act Aers (Albuterol) .... Inhale 2 Puffs Four Times A Day As Needed 2)  Prednisone 5 Mg Tabs (Prednisone) .... Take 3 Tablets By Mouth Once A Day 3)  Calcium 500/d 500-200 Mg-Unit Tabs (Calcium Carbonate-Vitamin D) .... Take 1 Tablet By Mouth Three Times A Day 4)  Omeprazole 20 Mg Cpdr (Omeprazole) .... Take 1 Tablet By Mouth Two Times A Day 5)  Flonase 50 Mcg/act Susp (Fluticasone Propionate) .... 2 Sprays in Each Nostril Daily  Allergies: No Known Drug Allergies  Review of Systems       per HPI  Physical Exam  General:  alert and overweight-appearing.  Protuberant abdomen. Eyes:  vision grossly intact, pupils equal, pupils round, and pupils reactive to light.   Mouth:  pharynx pink and moist.   Lungs:  normal respiratory effort and no wheezes.  Minimal crackles at bases. Heart:  regular rhythm, no murmur, no gallop, and tachycardia.   Abdomen:  soft, non-tender, no guarding, no rigidity, no rebound tenderness, bowel sounds hyperactive, and distended.   Extremities:  periankle edema   Impression & Recommendations:  Problem # 1:  HEPATOMEGALY (L4387844.1) Assessment Deteriorated Pt with previous similar spx, with liver Bx in 03/2008 showing sarcoid changes. She usually has relief after Prednisone increased. I will increase from 10 to 15 mg daily. We discussed about steroids SEs. Will also check CMET and CBC today, along with FLP.  Orders: T-Comprehensive Metabolic Panel ([id number]) T-CBC No Diff [id number]) T-Lipid Profile [id number])  Problem # 2:  HYPERTENSION (ICD-401.9) Good control, will maintain same treatment.   BP today: 130/80 Prior BP: 145/89 (06/12/2008)  Labs Reviewed: K+: 3.8 (04/08/2008) Creat: : 0.60 (04/08/2008)      Complete Medication List: 1)  Albuterol 90 Mcg/act Aers (Albuterol) .... Inhale 2 puffs four times a day as needed 2)  Prednisone 5 Mg Tabs (Prednisone) .... Take 3 tablets by mouth once a day 3)  Calcium 500/d 500-200 Mg-unit Tabs (Calcium carbonate-vitamin d) .... Take 1 tablet by mouth three times a day 4)  Omeprazole 20 Mg Cpdr (Omeprazole) .... Take 1 tablet by mouth two times a day 5)  Flonase 50 Mcg/act Susp (Fluticasone propionate) .... 2 sprays in each nostril daily  Patient Instructions: 1)  Please schedule a follow-up appointment in 3 months. Prescriptions: PREDNISONE 5 MG TABS (PREDNISONE) Take 3 tablets by mouth once a day  #45 x 5   Entered and Authorized by:   Philemon Kingdom MD   Signed by:   Philemon Kingdom MD on 07/27/2008   Method used:   Print then Give to Patient   RxID:   [id number]

## 2010-05-31 NOTE — Assessment & Plan Note (Signed)
Summary: FU/SB.   Vital Signs:  Patient profile:   40 year old female Height:      61 inches (154.94 cm) Weight:      226.7 pounds (103.05 kg) BMI:     42.99 O2 Sat:      100 % on 2 L/min Temp:     97.9 degrees F oral Pulse rate:   120 / minute BP sitting:   122 / 83  (left arm) Cuff size:   large  Vitals Entered By: Morrison Old RN (January 19, 2010 11:32 AM)  O2 Flow:  2 L/min CC: F/U visit. Using oxygen. Feels tired-wants to know if she might be depressed. Is Patient Diabetic? Yes Did you bring your meter with you today? Yes Pain Assessment Patient in pain? no      Nutritional Status BMI of > 30 = obese CBG Result 113  Have you ever been in a relationship where you felt threatened, hurt or afraid?No   Does patient need assistance? Functional Status Self care Ambulation Normal   Primary Care Provider:  Junius Finner  MD  CC:  F/U visit. Using oxygen. Feels tired-wants to know if she might be depressed.Marland Kitchen  History of Present Illness: 40 yo female with PMH of HTN, DM type II, GERD, sarcoidosis is here for follow-up. Pt was seen in clinic 2 weeks ago by Dr. Silverio Decamp and Dr. Melvyn Novas for management of chronic medical conditions.   1) Sarcoidosis - dx of sarcoidosis in 2001 = sob, cough  became 02 dep  in July 2011 and is on chronic prednisone therapy every other day that was started by Dr. Melvyn Novas at last visit. Pt also had a CT of chest done which showed stable sarcoidosis changes.  Pt does not complain of worsening sob, or cough, and complains of mild fatigue only.   2) HTN - Pt's nifedipine was stopped. Per Dr. Gustavus Bryant report: Nifedipine poor choice for ccb in pt with nl ef and marked shunting as it tends to interfere with Hypoxic pulmonary vasoconstriction   Depression History:      The patient denies a depressed mood most of the day and a diminished interest in her usual daily activities.         Preventive Screening-Counseling & Management  Alcohol-Tobacco     Alcohol  drinks/day: 0     Smoking Status: quit     Smoking Cessation Counseling: yes     Packs/Day: 1/3     Year Started: 25+ years  Caffeine-Diet-Exercise     Does Patient Exercise: yes     Type of exercise: walking     Times/week: 5  Current Medications (verified): 1)  Metformin Hcl 1000 Mg Tabs (Metformin Hcl) .... Take 1 Tablet By Mouth Two Times A Day 2)  Omeprazole 20 Mg Cpdr (Omeprazole) .... Take One 30-60 Min Before First and Last Meals of The Day 3)  Novolin 70/30 70-30 % Susp (Insulin Isophane & Regular) .... Inject 35 Units 30 Minutes Before Breakfast and 40 Units 30 Minutes Before Evening Meal. 4)  Insulin Syringe/needle 28g X 1/2" 0.5 Ml Misc (Insulin Syringe-Needle U-100) .... Per Instruction 5)  Freestyle Lite Test  Strp (Glucose Blood) .... Use To Check Blood Sugar 3x Daily 6)  Lancets  Misc (Lancets) .... Use To Check Blood Sugar Three Times Daily 7)  Prednisone 20 Mg Tabs (Prednisone) .... Take 1/2 Tablet By Mouth Every Other Day 8)  Ferrous Sulfate 325 (65 Fe) Mg Tabs (Ferrous Sulfate) .... Take 1 Tablet  By Mouth Two Times A Day 9)  Furosemide 40 Mg Tabs (Furosemide) .Marland Kitchen.. 1 Tablet By Mouth Daily 10)  Diltiazem Hcl Coated Beads 240 Mg Xr24h-Cap (Diltiazem Hcl Coated Beads) .... Take 1 Capsule By Mouth Once A Day 11)  Albuterol 90 Mcg/act Aers (Albuterol) .... Inhale 2 Puffs Four Times A Day As Needed  Allergies (verified): No Known Drug Allergies  Past History:  Past Medical History: Last updated: 01/06/2010 Sarcoidosis (Dr Wert)-w/ liver involvement per biopsy 12/09 Unexplained Hypoxemia July 2011     - CT angiogram 01/07/10 >>> QT prolongation Diabetes mellitus, type II Hypertension Heart catheterization 06-06-06 : No CAD, no RAS, normal EF Hx eclampsia Hx of scalp seborrheic dermatitis Abnormal LFT's:   Alk phos 713, TB 1.3, AST 52, ALT 18 in 11/09- Liver u/s and exam c/w HSM.  Hep B                               serology neg but Hep C ab +, HIV neg.  Coags  slightly prolonged and alb low.  Started                                             empirically on prednisone for GI sarcoid and tapering.  AMA and Hep C viral load neg.                 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  Smokes 1/2 ppd x 20 years.  She denies illegal drugs and alcohol use.    Risk Factors: Alcohol Use: 0 (01/19/2010) Exercise: yes (01/19/2010)  Risk Factors: Smoking Status: quit (01/19/2010) Packs/Day: 1/3 (01/19/2010)  Review of Systems General:  Complains of malaise. CV:  Denies chest pain or discomfort and swelling of feet. Resp:  Denies cough and shortness of breath. GI:  Denies abdominal pain, indigestion, nausea, and vomiting. GU:  Denies dysuria and urinary frequency. MS:  Denies joint pain.  Physical Exam  General:  alert and well-developed.   Head:  normocephalic and atraumatic.   Eyes:  vision grossly intact, pupils equal, pupils round, and pupils reactive to light.   Mouth:  pharynx pink and moist.   Neck:  supple.   Lungs:  normal respiratory effort and normal breath sounds.   Heart:  regular rhythm, no murmur, no gallop, no rub, and tachycardia.   Abdomen:  soft and non-tender.   Msk:  normal ROM.     Impression & Recommendations:  Problem # 1:  DIABETES MELLITUS, TYPE II, UNCONTROLLED (ICD-250.02) Assessment Improved DM in excellent control. I have reduced her insulin to 30 units in am and pm. Will also take Metformin off med list,  as A1C wnl.   The following medications were removed from the medication list:    Metformin Hcl 1000 Mg Tabs (Metformin hcl) .Marland Kitchen... Take 1 tablet by mouth  two times a day Her updated medication list for this problem includes:    Novolin 70/30 70-30 % Susp (Insulin isophane & regular) ..... Inject 30 units 30 minutes before breakfast and 30 units 30 minutes before evening meal.  Orders: T-Hgb A1C (in-house) ([id number])Future Orders: T-Lipid Profile [id number] ... 01/20/2010 T-CBC No Diff ([id number]) ... 01/20/2010  Labs Reviewed: Creat: 0.83 (12/14/2009)     Last Eye Exam: Mild non-proliferative diabetic retinopathy.  OD  (06/17/2009) Reviewed HgBA1c results: 5.2 (01/19/2010)  8.0 (09/29/2009)  Problem # 2:  HYPERTENSION (ICD-401.9) Assessment: Improved Well controlled. As mentioned in HPI, nifedipine was d/c per Dr. Melvyn Novas as Nifedipine poor choice for ccb in pt with nl ef and marked shunting as it tends to interfere with Hypoxic pulmonary vasoconstriction. Will continue this new regimen.     Her updated medication list for this problem includes:    Furosemide 40 Mg Tabs (Furosemide) .Marland Kitchen... 1 tablet by mouth daily    Diltiazem Hcl Coated Beads 240 Mg Xr24h-cap (Diltiazem hcl coated beads) .Marland Kitchen... Take 1 capsule by mouth once a day  BP today: 122/83 Prior BP: 120/70 (01/06/2010)  Labs Reviewed: K+: 3.7 (12/14/2009) Creat: : 0.83 (12/14/2009)   Chol: 278 (01/21/2009)   HDL: 56 (01/21/2009)   LDL: * mg/dL (01/21/2009)   TG: 403 (01/21/2009)  Problem # 3:  SARCOIDOSIS (ICD-135) No new CT changes. Appears stable. Continue with prednisone therapy every other day. Pt followed by [facility] for this matter.   Problem # 4:  ANEMIA IN CHRONIC KIDNEY DISEASE (ICD-285.21) Assessment: Improved Anemia of chronic disease (sarcoidosis), with relatively low iron level in the past. Will check CBC along with lipid profile in AM. Continue current therapy for now.  Her updated medication list  for this problem includes:    Ferrous Sulfate 325 (65 Fe) Mg Tabs (Ferrous sulfate) .Marland Kitchen... Take 1 tablet by mouth two times a day  Future Orders: T-CBC No Diff [id number]) ... 01/20/2010  Hgb: 10.5 (12/02/2009)   Hct: 35.7 (12/02/2009)   Platelets: 230 (12/02/2009) RBC: 4.33 (12/02/2009)   RDW: 16.6 (12/02/2009)   WBC: 7.3 (12/02/2009) MCV: 82.4 (12/02/2009)   MCHC: 29.4 (12/02/2009) Ferritin: 57 (03/23/2008) Iron: 41 (03/23/2008)   TIBC: 282 (03/23/2008)   % Sat: 15 (03/23/2008) B12: 412 (03/23/2008)   Folate: 671 (03/23/2008)   TSH: 2.802 (03/11/2008)  Complete Medication List: 1)  Omeprazole 20 Mg Cpdr (Omeprazole) .... Take one 30-60 min before first and last meals of the day 2)  Novolin 70/30 70-30 % Susp (Insulin isophane & regular) .... Inject 30 units 30 minutes before breakfast and 30 units 30 minutes before evening meal. 3)  Insulin Syringe/needle 28g X 1/2" 0.5 Ml Misc (Insulin syringe-needle u-100) .... Per instruction 4)  Freestyle Lite Test Strp (Glucose blood) .... Use to check blood sugar 3x daily 5)  Lancets Misc (Lancets) .... Use to check blood sugar three times daily 6)  Prednisone 20 Mg Tabs (Prednisone) .... Take 1/2 tablet by mouth every other day 7)  Ferrous Sulfate 325 (65 Fe) Mg Tabs (Ferrous sulfate) .... Take 1 tablet by mouth two times a day 8)  Furosemide 40 Mg Tabs (Furosemide) .Marland Kitchen.. 1 tablet by mouth daily 9)  Diltiazem Hcl Coated Beads 240 Mg Xr24h-cap (Diltiazem hcl coated beads) .... Take 1 capsule by mouth once a day 10)  Albuterol 90 Mcg/act Aers (Albuterol) .... Inhale 2 puffs four times a day as needed  Other Orders: T- Capillary Blood Glucose [id number]) Future Orders: T-Basic Metabolic Panel ([id number]) .Marland KitchenMarland Kitchen  01/20/2010  Patient Instructions: 1)  Please come to the clinic to have labs drawn tomorrow morning Sept. 22, 2011 2)  Please make sure you are fasting for blood work.  3)  Please take insulin 30 units in am and pm.  4)  Please take all  other meds as prescribed 5)  Please follow up in 3 months, or earlier if needed.  Prescriptions: PREDNISONE 20 MG TABS (PREDNISONE) Take 1/2 tablet by mouth every other day  #15 x 3   Entered and Authorized by:   Jolene Provost MD   Signed by:   Jolene Provost MD on 01/19/2010   Method used:   Print then Give to Patient   RxID:   [id number] DILTIAZEM HCL COATED BEADS 240 MG XR24H-CAP (DILTIAZEM HCL COATED BEADS) Take 1 capsule by mouth once a day  #31 x 3   Entered and Authorized by:   Jolene Provost MD   Signed by:   Jolene Provost MD on 01/19/2010   Method used:   Print then Give to Patient   RxID:   [id number] FUROSEMIDE 40 MG TABS (FUROSEMIDE) 1 tablet by mouth daily  #30 x 3   Entered and Authorized by:   Jolene Provost MD   Signed by:   Jolene Provost MD on 01/19/2010   Method used:   Print then Give to Patient   RxID:   [id number]   Prevention & Chronic Care Immunizations   Influenza vaccine: Historical  (03/01/2009)   Influenza vaccine deferral: Deferred  (01/19/2010)   Influenza vaccine due: 12/30/2009    Tetanus booster: Not documented   Td booster deferral: Deferred  (05/06/2009)    Pneumococcal vaccine: Historical  (03/01/2009)   Pneumococcal vaccine deferral: Deferred  (10/12/2009)  Other Screening   Pap smear: NEGATIVE FOR INTRAEPITHELIAL LESIONS OR MALIGNANCY.  (03/11/2008)   Pap smear action/deferral: Deferred  (06/02/2009)    Mammogram: BI-RADS CATEGORY 2:  Benign finding(s).^MM DIGITAL DIAG LTD R  (04/20/2009)   Mammogram action/deferral: Ordered  (01/21/2009)   Smoking status: quit  (01/19/2010)  Diabetes Mellitus   HgbA1C: 5.2  (01/19/2010)    Eye exam: Mild non-proliferative diabetic retinopathy.  OD   (06/17/2009)   Diabetic eye exam action/deferral: Ophthalmology referral  (02/09/2009)   Eye exam due: 12/2009    Foot exam: yes  (03/03/2009)   Foot exam action/deferral: Do today   High risk foot: Not documented   Foot care  education: Done  (02/09/2009)   Foot exam due: 02/09/2010    Urine microalbumin/creatinine ratio: 25.0  (08/12/2009)   Urine microalbumin action/deferral: Ordered    Diabetes flowsheet reviewed?: Yes   Progress toward A1C goal: At goal  Lipids   Total Cholesterol: 278  (01/21/2009)   Lipid panel action/deferral: LDL Direct Ordered   LDL: * mg/dL  (01/21/2009)   LDL Direct: 126  (02/09/2009)   HDL: 56  (01/21/2009)   Triglycerides: 403  (01/21/2009)   Lipid panel due: 01/07/2010  Hypertension   Last Blood Pressure: 122 / 83  (01/19/2010)   Serum creatinine: 0.83  (12/14/2009)   BMP action: Ordered   Serum potassium 3.7  (12/14/2009)    Hypertension flowsheet reviewed?: Yes   Progress toward BP goal: At goal  Self-Management Support :   Personal Goals (by the next clinic visit) :     Personal A1C goal: 7  (02/09/2009)     Personal blood pressure goal: 130/80  (01/21/2009)     Personal LDL goal: 100  (05/06/2009)  Patient will work on the following items until the next clinic visit to reach self-care goals:     Medications and monitoring: take my medicines every day, check my blood pressure, bring all of my medications to every visit, examine my feet every day  (01/19/2010)     Eating: eat more vegetables, use fresh or frozen vegetables, eat foods that are low in salt, eat baked foods instead of fried foods  (01/19/2010)     Activity: join a walking program  (12/14/2009)     Other: walking when not too short of breath  (12/14/2009)    Diabetes self-management support: Written self-care plan  (01/19/2010)   Diabetes care plan printed   Last diabetes self-management training by diabetes educator: 01/28/2009    Hypertension self-management support: Written self-care plan  (01/19/2010)   Hypertension self-care plan printed.  Process Orders Check Orders Results:     [facility]: [id number] not required for this insurance Tests Sent for requisitioning (January 21, 2010 7:24 PM):     01/20/2010: [facility] -- T-Basic Metabolic Panel [id number] (signed)     01/20/2010: [facility] -- T-Lipid Profile [phone] (signed)     01/20/2010: [facility] -- T-CBC No Diff [id number] (signed)     Laboratory Results   Blood Tests   Date/Time Received: January 19, 2010 11:51 AM  Date/Time Reported: Lenoria Farrier  January 19, 2010 11:51 AM   HGBA1C: 5.2%   (Normal Range: Non-Diabetic - 3-6%   Control Diabetic - 6-8%) CBG Random:: 113mg /dL

## 2010-05-31 NOTE — Assessment & Plan Note (Signed)
Summary: HFU-PER DR (W[facility])/CFB   Vital Signs:  Patient profile:   40 year old female Height:      61 inches (154.94 cm) Weight:      228.7 pounds (103.95 kg) BMI:     43.37 Temp:     97.3 degrees F (36.28 degrees C) oral Pulse rate:   88 / minute BP sitting:   123 / 84  (right arm)  Vitals Entered By: Hilda Blades Ditzler RN (October 12, 2009 8:57 AM) Is Patient Diabetic? Yes Did you bring your meter with you today? No Pain Assessment Patient in pain? no      Nutritional Status BMI of > 30 = obese Nutritional Status Detail appetite fair  Have you ever been in a relationship where you felt threatened, hurt or afraid?denies   Does patient need assistance? Functional Status Self care Ambulation Normal Comments HFU - tighs feels tight when sitting past 2 months. Skin has white coating. Calcium makes hands cramp.   Primary Care Provider:  Junius Finner  MD   History of Present Illness: [name] comes for a hfu visit. She was admitted for N/V/D, etiology unclear. It has resolved now.   She has rash on upper chest in front. It is itching. She had it for a month. She is not working in the sun. She used hydrocortisone cream, which decreased some itching but did not cured it. No similar rash elsewhere. No use of necklace. No use of new detergents, clothes, soap or deodarant. No bug bites.   Depression History:      The patient denies a depressed mood most of the day and a diminished interest in her usual daily activities.         Preventive Screening-Counseling & Management  Alcohol-Tobacco     Alcohol drinks/day: 0     Smoking Status: current     Smoking Cessation Counseling: yes     Packs/Day: 1/3     Year Started: 25+ years  Caffeine-Diet-Exercise     Does Patient Exercise: yes     Type of exercise: walking     Times/week: 5  Current Medications (verified): 1)  Prednisone 5 Mg Tabs (Prednisone) .... Take As Directed. 2)  Metformin Hcl 1000 Mg Tabs (Metformin Hcl) ....  Take 1 Tablet By Mouth Two Times A Day 3)  Albuterol 90 Mcg/act Aers (Albuterol) .... Inhale 2 Puffs Four Times A Day As Needed 4)  Calcium 500/d 500-200 Mg-Unit Tabs (Calcium Carbonate-Vitamin D) .... Take 1 Tablet By Mouth Three Times A Day 5)  Omeprazole 20 Mg Cpdr (Omeprazole) .... Take 1 Tablet By Mouth Two Times A Day 6)  Novolin 70/30 70-30 % Susp (Insulin Isophane & Regular) .... Inject 35 Units Subcutaneously Two Times A Day 7)  Insulin Syringe/needle 28g X 1/2" 0.5 Ml Misc (Insulin Syringe-Needle U-100) .... Per Instruction 8)  Freestyle Lite Test  Strp (Glucose Blood) .... Use To Check Blood Sugar 3x Daily 9)  Lancets  Misc (Lancets) .... Use To Check Blood Sugar Three Times Daily 10)  Accupril 40 Mg Tabs (Quinapril Hcl) .... Take Two Tabs By Mouth Once A Day. 11)  Furosemide 40 Mg Tabs (Furosemide) .... Take 1 Tablet By Mouth Twice A Day 12)  K-Lor 20 Meq Pack (Potassium Chloride) .... Take 1 Tablet By Mouth Once A Day 13)  Toprol Xl 25 Mg Xr24h-Tab (Metoprolol Succinate) .... Take 1 Tablet By Mouth Once A Day  Allergies: No Known Drug Allergies  Review of Systems  See HPI  Physical Exam  Mouth:  pharynx pink and moist.   Chest Wall:  There is a curvilinear rash, on the upper chest with little pink hue. It seems like photosensitive rash. THere are tiny papules. No discharge or open wound. No rash seen elsewhere.  Lungs:  normal breath sounds, no crackles, and no wheezes.   Heart:  normal rate, regular rhythm, no murmur, and no gallop.   Extremities:  trace left pedal edema and trace right pedal edema.   Neurologic:  alert & oriented X3.     Impression & Recommendations:  Problem # 1:  NAUSEA AND VOMITING (ICD-787.01) resolved. Her cr was normal at d/c, will not repeat BMET.   Problem # 2:  SKIN RASH (ICD-782.1) See PE and HPI. This looks like photosensitivity, but pt states she has not been in the sun. This is also very dry. WIll try something for the dryness with  OTC meds like acquaphor and hydrocortisone cream mixed and f/u in next appt.   Complete Medication List: 1)  Prednisone 5 Mg Tabs (Prednisone) .... Take as directed. 2)  Metformin Hcl 1000 Mg Tabs (Metformin hcl) .... Take 1 tablet by mouth two times a day 3)  Albuterol 90 Mcg/act Aers (Albuterol) .... Inhale 2 puffs four times a day as needed 4)  Calcium 500/d 500-200 Mg-unit Tabs (Calcium carbonate-vitamin d) .... Take 1 tablet by mouth three times a day 5)  Omeprazole 20 Mg Cpdr (Omeprazole) .... Take 1 tablet by mouth two times a day 6)  Novolin 70/30 70-30 % Susp (Insulin isophane & regular) .... Inject 35 units subcutaneously two times a day 7)  Insulin Syringe/needle 28g X 1/2" 0.5 Ml Misc (Insulin syringe-needle u-100) .... Per instruction 8)  Freestyle Lite Test Strp (Glucose blood) .... Use to check blood sugar 3x daily 9)  Lancets Misc (Lancets) .... Use to check blood sugar three times daily 10)  Accupril 40 Mg Tabs (Quinapril hcl) .... Take two tabs by mouth once a day. 11)  Furosemide 40 Mg Tabs (Furosemide) .... Take 1 tablet by mouth twice a day 12)  K-lor 20 Meq Pack (Potassium chloride) .... Take 1 tablet by mouth once a day 13)  Toprol Xl 25 Mg Xr24h-tab (Metoprolol succinate) .... Take 1 tablet by mouth once a day  Patient Instructions: 1)  Please schedule a follow-up appointment in 1 month. 2)  Limit your Sodium (Salt) to less than 2 grams a day(slightly less than 1/2 a teaspoon) to prevent fluid retention, swelling, or worsening of symptoms. 3)  It is important that you exercise regularly at least 20 minutes 5 times a week. If you develop chest pain, have severe difficulty breathing, or feel very tired , stop exercising immediately and seek medical attention. 4)  You need to lose weight. Consider a lower calorie diet and regular exercise.  5)  Check your Blood Pressure regularly. If it is above: you should make an appointment.   Prevention & Chronic  Care Immunizations   Influenza vaccine: Fluvax 3+  (04/08/2008)   Influenza vaccine deferral: Deferred  (05/06/2009)   Influenza vaccine due: 12/30/2009    Tetanus booster: Not documented   Td booster deferral: Deferred  (05/06/2009)    Pneumococcal vaccine: Not documented   Pneumococcal vaccine deferral: Deferred  (10/12/2009)  Other Screening   Pap smear: NEGATIVE FOR INTRAEPITHELIAL LESIONS OR MALIGNANCY.  (03/11/2008)   Pap smear action/deferral: Deferred  (06/02/2009)    Mammogram: BI-RADS CATEGORY 2:  Benign finding(s).^MM DIGITAL  DIAG LTD R  (04/20/2009)   Mammogram action/deferral: Ordered  (01/21/2009)   Smoking status: current  (10/12/2009)   Smoking cessation counseling: yes  (10/12/2009)  Diabetes Mellitus   HgbA1C: 8.0  (09/29/2009)    Eye exam: Mild non-proliferative diabetic retinopathy.  OD   (06/17/2009)   Diabetic eye exam action/deferral: Ophthalmology referral  (02/09/2009)   Eye exam due: 12/2009    Foot exam: yes  (03/03/2009)   Foot exam action/deferral: Do today   High risk foot: Not documented   Foot care education: Done  (02/09/2009)   Foot exam due: 02/09/2010    Urine microalbumin/creatinine ratio: 25.0  (08/12/2009)   Urine microalbumin action/deferral: Ordered    Diabetes flowsheet reviewed?: Yes   Progress toward A1C goal: At goal  Lipids   Total Cholesterol: 278  (01/21/2009)   Lipid panel action/deferral: LDL Direct Ordered   LDL: * mg/dL  (01/21/2009)   LDL Direct: 126  (02/09/2009)   HDL: 56  (01/21/2009)   Triglycerides: 403  (01/21/2009)  Hypertension   Last Blood Pressure: 123 / 84  (10/12/2009)   Serum creatinine: 1.97  (09/29/2009)   BMP action: Ordered   Serum potassium 3.9  (09/29/2009)    Hypertension flowsheet reviewed?: Yes   Progress toward BP goal: Unchanged  Self-Management Support :   Personal Goals (by the next clinic visit) :     Personal A1C goal: 7  (02/09/2009)     Personal blood pressure goal: 130/80   (01/21/2009)     Personal LDL goal: 100  (05/06/2009)    Patient will work on the following items until the next clinic visit to reach self-care goals:     Medications and monitoring: take my medicines every day, check my blood pressure, bring all of my medications to every visit, weigh myself weekly, examine my feet every day  (10/12/2009)     Eating: drink diet soda or water instead of juice or soda, eat more vegetables, use fresh or frozen vegetables, eat foods that are low in salt, eat baked foods instead of fried foods, eat fruit for snacks and desserts  (10/12/2009)     Activity: take a 30 minute walk every day, park at the far end of the parking lot  (10/12/2009)    Diabetes self-management support: Written self-care plan, Education handout, Resources for patients handout  (10/12/2009)   Diabetes care plan printed   Diabetes education handout printed   Last diabetes self-management training by diabetes educator: 01/28/2009    Hypertension self-management support: Written self-care plan, Education handout, Resources for patients handout  (10/12/2009)   Hypertension self-care plan printed.   Hypertension education handout printed      Resource handout printed.

## 2010-05-31 NOTE — Progress Notes (Signed)
Summary: new script, chantix/ hla  Phone Note Refill Request Message from:  Patient on Sep 24, 2009 5:24 PM  Refills Requested: Medication #1:  CHANTIX 1 MG TABS Take 1 tablet by mouth once a day pt never picked up her first script of chantix at the health de[facility], they need a new script  Initial call taken by: Freddy Finner RN,  Sep 24, 2009 5:25 PM  Follow-up for Phone Call        Refill approved-nurse to complete Follow-up by: Junius Finner  MD,  Sep 28, 2009 7:07 AM    New/Updated Medications: CHANTIX 1 MG TABS (VARENICLINE TARTRATE) Start 1/2 tab by mouth daily for three days then 1/2 tab twice daily for 4 days then increase to 1 full tab twice daily. Prescriptions: CHANTIX 1 MG TABS (VARENICLINE TARTRATE) Start 1/2 tab by mouth daily for three days then 1/2 tab twice daily for 4 days then increase to 1 full tab twice daily.  #60 x 0   Entered and Authorized by:   Junius Finner  MD   Signed by:   Junius Finner  MD on 09/29/2009   Method used:   Faxed to ...       [facility] Department (retail)       [address] [place], [place]  [address]       Ph: [phone]       Fax: [phone]   RxID:   [id number]

## 2010-05-31 NOTE — Miscellaneous (Signed)
Summary: Orders Update pft charges  Clinical Lists Changes  Orders: Added new Service order of Carbon Monoxide diffusing w/capacity (94720) - Signed Added new Service order of Lung Volumes (94240) - Signed Added new Service order of Spirometry (Pre & Post) (94060) - Signed 

## 2010-05-31 NOTE — Progress Notes (Signed)
Summary: work Counselling psychologist Note Call from Patient   Summary of Call: pt calls, needs a note for work, stating she cannot return to work. please address to teresa simmons, [employer], fax to [phone].  Initial call taken by: Freddy Finner RN,  December 16, 2009 11:27 AM  Follow-up for Phone Call        Letter written and faxed to Ms. Polo Riley, manager of [employer] Follow-up by: Marcelle Smiling MD,  December 16, 2009 2:28 PM

## 2010-05-31 NOTE — Progress Notes (Signed)
Summary: Refill/gh  Phone Note Refill Request Message from:  Patient on July 30, 2009 5:01 PM  Refills Requested: Medication #1:  K-LOR 20 MEQ PACK Take 1 tablet by mouth once a day  Medication #3:  l  Method Requested: Fax to [facility] Initial call taken by: Sander Nephew RN,  July 30, 2009 5:01 PM Caller: Patient    Prescriptions: K-LOR 20 MEQ PACK (POTASSIUM CHLORIDE) Take 1 tablet by mouth once a day  #30 x 3   Entered and Authorized by:   Junius Finner  MD   Signed by:   Junius Finner  MD on 08/02/2009   Method used:   Faxed to ...       [facility] Department (retail)       [address] [place], [place]  [address]       Ph: [phone]       Fax: [phone]   RxID:   [id number]

## 2010-05-31 NOTE — Progress Notes (Signed)
Summary: LANTUS? / hla  Phone Note From Pharmacy   Summary of Call: pt got lantus on 9/24, the script was written by dr Ronny Flurry and confirmed by Barnabas Harries, it was never added to her med list and she has only gotten it filled that 1 time, that would have been enough for 50 days = 20units q hs. does she need to be taking this? she has been filling her novolin 70/30 on an appr schedule. she did get 1 vial of novolin n in dec 2010 that possibly substituted for the lantus. this is quite confusing. Initial call taken by: Freddy Finner RN,  August 12, 2009 2:32 PM  Follow-up for Phone Call        He must have forgotton to enter this in our EMR when she was d/c'd from the hospital.  She is supposed to be taking the 70/30 and I confirmed with her that this is what she is taking.  She is not supposed to be taking lantus and it is not on her active medication list.  Follow-up by: Junius Finner  MD,  August 12, 2009 3:45 PM

## 2010-05-31 NOTE — Progress Notes (Signed)
  Phone Note Other Incoming   Request: Send information Summary of Call: Request received from [place]. Forwarded to [employer].

## 2010-05-31 NOTE — Assessment & Plan Note (Signed)
Summary: 2WK F/U/SHAH/VS   Vital Signs:  Patient profile:   40 year old female Height:      61 inches (154.94 cm) Weight:      226.04 pounds (102.75 kg) O2 Sat:      95 % on Room air Temp:     98.4 degrees F (36.89 degrees C) oral Pulse rate:   126 / minute BP sitting:   157 / 107  (right arm)  Vitals Entered By: Sander Nephew RN (June 02, 2009 3:44 PM)  O2 Flow:  Room air CC: Depression Is Patient Diabetic? Yes Did you bring your meter with you today? Yes Pain Assessment Patient in pain? no      Nutritional Status BMI of > 30 = obese CBG Result 416  Have you ever been in a relationship where you felt threatened, hurt or afraid?No   Does patient need assistance? Functional Status Self care Ambulation Normal Comments Shortness of breath.  Coughing up yellow phlegm  Has had a cold since last week.  Inhaler does not help.   Primary Care Provider:  Junius Finner  MD  CC:  Depression.  History of Present Illness: Pt is a 40 yo female w/ extensive past medical history below here for f/u on chronic issues.  She is very overwhelmed by all of her medical conditions.  She has not started the enalapril b/c she said the Health Dept said she couldn't get it for free.    She started feeling bad 5 days ago w/ cough w/ yellowish phlegm.  No known fevers but she has sweat some but no rigors. She denies chest pain but did have some low back pain.  She has had a runny nose.  Her children at home have been sick.  She continues to smoke b/c can't afford chantix yet.   She has been out of several of her meds b/c she can't afford them until Friday, including her diabetes meds.  All of her blood sugars have been too high at home.   Depression History:      The patient denies a depressed mood most of the day and a diminished interest in her usual daily activities.         Preventive Screening-Counseling & Management  Alcohol-Tobacco     Alcohol drinks/day: 0     Smoking Status:  current     Smoking Cessation Counseling: yes     Packs/Day: 1/3     Year Started: 25+ years  Current Medications (verified): 1)  Prednisone 10 Mg Tabs (Prednisone) .... Take 4 Tabs By Mouth Once Daily 2)  Metformin Hcl 1000 Mg Tabs (Metformin Hcl) .... Take 1 Tablet By Mouth Two Times A Day 3)  Albuterol 90 Mcg/act Aers (Albuterol) .... Inhale 2 Puffs Four Times A Day As Needed 4)  Calcium 500/d 500-200 Mg-Unit Tabs (Calcium Carbonate-Vitamin D) .... Take 1 Tablet By Mouth Three Times A Day 5)  Omeprazole 20 Mg Cpdr (Omeprazole) .... Take 1 Tablet By Mouth Two Times A Day 6)  Novolin 70/30 70-30 % Susp (Insulin Isophane & Regular) .... Inject 30 Units Subcutaneously Two Times A Day 7)  Insulin Syringe/needle 28g X 1/2" 0.5 Ml Misc (Insulin Syringe-Needle U-100) .... Per Instruction 8)  Freestyle Lite Test  Strp (Glucose Blood) .... Use To Check Blood Sugar 3x Daily 9)  Lancets  Misc (Lancets) .... Use To Check Blood Sugar Three Times Daily 10)  Zyrtec Allergy 10 Mg Caps (Cetirizine Hcl) .... Take 1 Tablet By  Mouth Once A Day 11)  Nystatin  Powd (Nystatin) .... Apply To Affected Area 2-3 Times Per Day 12)  Chantix 1 Mg Tabs (Varenicline Tartrate) .... Take 1 Tablet By Mouth Once A Day 13)  Accupril 20 Mg Tabs (Quinapril Hcl) .... Two Times A Day 14)  Furosemide 40 Mg Tabs (Furosemide) .... Take 1 Tablet By Mouth Once A Day 15)  K-Lor 20 Meq Pack (Potassium Chloride) .... Take 1 Tablet By Mouth Once A Day 16)  Toprol Xl 25 Mg Xr24h-Tab (Metoprolol Succinate) .... Take 1 Tablet By Mouth Once A Day 17)  Glipizide Xl 2.5 Mg Xr24h-Tab (Glipizide) .... Take 1 Tablet By Mouth Once A Day  Allergies (verified): No Known Drug Allergies  Past History:  Past Surgical History: Last updated: 30-Nov-202008 Tubal ligation  Social History: Last updated: 04/06/2009 She is single, two children that are healthy.  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  Smokes 1/2 ppd x 20 years.  She denies illegal drugs and  alcohol use.    Risk Factors: Smoking Status: current (06/02/2009) Packs/Day: 1/3 (06/02/2009)  Past Medical History: Sarcoidosis (Dr Melvyn Novas) QT prolongation Diabetes mellitus, type II Hypertension Heart catheterization 06-06-06 : No CAD, no RAS, normal EF Hx eclampsia Hx of scalp seborrheic dermatitis Abnormal LFT's:   Alk phos 713, TB 1.3, AST 52, ALT 18 in 11/09- Liver u/s and exam c/w HSM.  Hep B                             serology neg but Hep C ab +, HIV neg.  Coags slightly prolonged and alb low.  Started                                           empirically on prednisone for GI sarcoid.  AMA and Hep C viral load neg.                 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 She is single, two children that are healthy.  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  Smokes 1/2 ppd x 20 years.  She denies illegal drugs and alcohol use.    Review of Systems       As per HPI.  Physical Exam  General:  alert, pleasant, cushinoid appearance. Eyes:  anicteric Mouth:  MMM, no thrush Neck:  no JVD Lungs:  few scattered expiratory crackles, nl resp effort Heart:  borderline tachy, distant Abdomen:  obese, soft, NT and ND. Extremities:  2+ pitting edema bilaterally up to the knees Neurologic:  gait normal. Psych:  eye contact good, mood and affect appear appropropriate.   Impression & Recommendations:  Problem # 1:  BRONCHITIS (ICD-490) Sounds like a viral URI vs bronchitis.  Sats good.  However, given that she is on a large dose of pred, will check CXR to look for pna as cause.  Will also check CBC looking for leukocytosis.  Will give 1 week of doxy but she notes that she can't pick it up for 2 days and I suggested she not buy it if she is feeling better.  If she gets worse, she knows  to go to the  ER.  Advised smoking cessation.  Her updated medication list for this problem includes:    Albuterol 90 Mcg/act Aers (Albuterol) ..... Inhale 2 puffs four times a day as needed    Doxycycline Hyclate 100 Mg Tabs (Doxycycline hyclate) .Marland Kitchen... Take 1 tablet by mouth two times a day  Orders: CXR- 2view (CXR) T-CBC w/Diff [id number])  Problem # 2:  SARCOIDOSIS (ICD-135) Would like to start tapering steroids but given her resp symptoms, will hold on tapering at this time.  Will start taper as soon as possible.  Needs eye exam as soon as possible to evaluate for ocular invt and states she plans on doing this in the next few weeks.  Orders: T-Comprehensive Metabolic Panel ([id number])  Problem # 3:  NEPHROTIC SYNDROME (ICD-581.9) She has not started ARB b/c Healt Dept stated it would be 5 dollars and she can't afford this.  She states that it is not free b/c she is not on medicaid.  I am not quite sure what to do in this situation medically.  I will have our SW get in touch w/ her regarding her disability claim to see if there is anything we can do to expedite her medicaid application.    Problem # 4:  HYPERTENSION (ICD-401.9) Off accupril.  Will see if we can get SW to help w/ medicaid application.  Her updated medication list for this problem includes:    Accupril 20 Mg Tabs (Quinapril hcl) .Marland Kitchen..Marland Kitchen Two times a day    Furosemide 40 Mg Tabs (Furosemide) .Marland Kitchen... Take 1 tablet by mouth once a day    Toprol Xl 25 Mg Xr24h-tab (Metoprolol succinate) .Marland Kitchen... Take 1 tablet by mouth once a day  Problem # 5:  DIABETES MELLITUS, TYPE II, UNCONTROLLED (ICD-250.02) She is out of metformin and glipizide b/c of finances.  Increase insulin dose to 35 units two times a day.  Her updated medication list for this problem includes:    Metformin Hcl 1000 Mg Tabs (Metformin hcl) .Marland Kitchen... Take 1 tablet by mouth two times a day    Novolin 70/30 70-30 % Susp (Insulin isophane & regular) ..... Inject 35 units  subcutaneously two times a day    Accupril 20 Mg Tabs (Quinapril hcl) .Marland Kitchen..Marland Kitchen Two times a day    Glipizide Xl 5 Mg Xr24h-tab (Glipizide) .Marland Kitchen... Take 1 tablet by mouth once a day  Problem # 6:  OBESITY, MORBID (ICD-278.01) She continues to gain weight.  Part of this may be related to the nephrotic syndrome vs CHF vs steroids.  Trying to aggressively tx the 1st two and plan is to taper steroids as soon as possible.  Problem # 7:  NICOTINE ADDICTION (ICD-305.1) Discussed cessation strategies.  Her updated medication list for this problem includes:    Chantix 1 Mg Tabs (Varenicline tartrate) .Marland Kitchen... Take 1 tablet by mouth once a day  Problem # 8:  CONGESTIVE HEART FAILURE (ICD-428.0) Nonischemic (cath 2008) neg. Will try to start all meds as soon as possible.  + edema, but may be related to nephrotic syndrome and no other overt signs of failure on exam.  Her updated medication list for this problem includes:    Accupril 20 Mg Tabs (Quinapril hcl) .Marland Kitchen..Marland Kitchen Two times a day    Furosemide 40 Mg Tabs (Furosemide) .Marland Kitchen... Take 1 tablet by mouth once a day    Toprol Xl 25 Mg Xr24h-tab (Metoprolol succinate) .Marland Kitchen... Take 1 tablet by mouth once a day  Complete Medication List: 1)  Prednisone 10 Mg Tabs (  Prednisone) .... Take 4 tabs by mouth once daily 2)  Metformin Hcl 1000 Mg Tabs (Metformin hcl) .... Take 1 tablet by mouth two times a day 3)  Albuterol 90 Mcg/act Aers (Albuterol) .... Inhale 2 puffs four times a day as needed 4)  Calcium 500/d 500-200 Mg-unit Tabs (Calcium carbonate-vitamin d) .... Take 1 tablet by mouth three times a day 5)  Omeprazole 20 Mg Cpdr (Omeprazole) .... Take 1 tablet by mouth two times a day 6)  Novolin 70/30 70-30 % Susp (Insulin isophane & regular) .... Inject 35 units subcutaneously two times a day 7)  Insulin Syringe/needle 28g X 1/2" 0.5 Ml Misc (Insulin syringe-needle u-100) .... Per instruction 8)  Freestyle Lite Test Strp (Glucose blood) .... Use to check blood sugar 3x  daily 9)  Lancets Misc (Lancets) .... Use to check blood sugar three times daily 10)  Zyrtec Allergy 10 Mg Caps (Cetirizine hcl) .... Take 1 tablet by mouth once a day 11)  Nystatin Powd (Nystatin) .... Apply to affected area 2-3 times per day 12)  Chantix 1 Mg Tabs (Varenicline tartrate) .... Take 1 tablet by mouth once a day 13)  Accupril 20 Mg Tabs (Quinapril hcl) .... Two times a day 14)  Furosemide 40 Mg Tabs (Furosemide) .... Take 1 tablet by mouth once a day 15)  K-lor 20 Meq Pack (Potassium chloride) .... Take 1 tablet by mouth once a day 16)  Toprol Xl 25 Mg Xr24h-tab (Metoprolol succinate) .... Take 1 tablet by mouth once a day 17)  Glipizide Xl 5 Mg Xr24h-tab (Glipizide) .... Take 1 tablet by mouth once a day 18)  Doxycycline Hyclate 100 Mg Tabs (Doxycycline hyclate) .... Take 1 tablet by mouth two times a day  Other Orders: Capillary Blood Glucose/CBG [id number])  Patient Instructions: 1)  Please make a followup appointment in 2 weeks for a check up. 2)  Please talk with Katy Fitch to see if she can help you with your medical finances. 3)  Please get your chest xray done and start your meds. 4)  You will be called with any abnormal labwork.  Please make sure your phone number is correct at the front desk. 5)  Please see the eye doctor as soon as possible. 6)  Increase your insulin to 35 units before breakfast and supper. Prescriptions: GLIPIZIDE XL 5 MG XR24H-TAB (GLIPIZIDE) Take 1 tablet by mouth once a day  #30 x 3   Entered and Authorized by:   Junius Finner  MD   Signed by:   Junius Finner  MD on 06/03/2009   Method used:   Electronically to        [facility].* (retail)       [phone]       [place], [place]  [address]       Ph: [phone]       Fax: [phone]   [id number] DOXYCYCLINE HYCLATE 100 MG TABS (DOXYCYCLINE HYCLATE) Take 1 tablet by mouth two times a day  #14 x 0   Entered and Authorized  by:   Junius Finner  MD   Signed by:   Junius Finner  MD on 06/03/2009   Method used:   Electronically to        [facility].* (retail)       [phone]       [facility]  [place], [place]  [address]       Ph: [phone]       Fax: [phone]   [id number] ACCUPRIL 20 MG TABS (QUINAPRIL HCL) two times a day  #60 x 0   Entered and Authorized by:   Junius Finner  MD   Signed by:   Junius Finner  MD on 06/02/2009   Method used:   Electronically to        [facility].* (retail)       [phone]       [place], [place]  [address]       Ph: [phone]       Fax: [phone]   RxID:   [id number]   Prevention & Chronic Care Immunizations   Influenza vaccine: Fluvax 3+  (04/08/2008)   Influenza vaccine deferral: Deferred  (05/06/2009)   Influenza vaccine due: 12/30/2009    Tetanus booster: Not documented   Td booster deferral: Deferred  (05/06/2009)    Pneumococcal vaccine: Not documented  Other Screening   Pap smear: NEGATIVE FOR INTRAEPITHELIAL LESIONS OR MALIGNANCY.  (03/11/2008)   Pap smear action/deferral: Deferred  (06/02/2009)    Mammogram: BI-RADS CATEGORY 2:  Benign finding(s).^MM DIGITAL DIAG LTD R  (04/20/2009)   Mammogram action/deferral: Ordered  (01/21/2009)   Smoking status: current  (06/02/2009)   Smoking cessation counseling: yes  (06/02/2009)  Diabetes Mellitus   HgbA1C: 13.1  (04/06/2009)    Eye exam: Not documented   Diabetic eye exam action/deferral: Ophthalmology referral  (02/09/2009)    Foot exam: yes  (03/03/2009)   Foot exam action/deferral: Do today   High risk foot: Not documented   Foot care education: Done  (02/09/2009)   Foot exam due: 02/09/2010    Urine microalbumin/creatinine ratio: 3417.1  (04/06/2009)   Urine microalbumin action/deferral: Ordered    Diabetes flowsheet reviewed?: Yes   Progress toward A1C goal:  Unchanged  Lipids   Total Cholesterol: 278  (01/21/2009)   Lipid panel action/deferral: LDL Direct Ordered   LDL: * mg/dL  (01/21/2009)   LDL Direct: 126  (02/09/2009)   HDL: 56  (01/21/2009)   Triglycerides: 403  (01/21/2009)  Hypertension   Last Blood Pressure: 157 / 107  (06/02/2009)   Serum creatinine: 0.59  (05/06/2009)   BMP action: Ordered   Serum potassium 4.1  (05/06/2009) CMP ordered     Hypertension flowsheet reviewed?: Yes   Progress toward BP goal: Unchanged  Self-Management Support :   Personal Goals (by the next clinic visit) :     Personal A1C goal: 7  (02/09/2009)     Personal blood pressure goal: 130/80  (01/21/2009)     Personal LDL goal: 100  (05/06/2009)    Patient will work on the following items until the next clinic visit to reach self-care goals:     Medications and monitoring: take my medicines every day, check my blood sugar, bring all of my medications to every visit  (06/02/2009)     Eating: drink diet soda or water instead of juice or soda, eat more vegetables, use fresh or frozen vegetables, eat foods that are low in salt, eat baked foods instead of fried foods  (06/02/2009)     Activity: take a 30 minute walk every day  (06/02/2009)    Diabetes self-management support: Written self-care plan  (06/02/2009)   Diabetes care plan printed   Last diabetes self-management training by diabetes educator: 01/28/2009  Hypertension self-management support: Written self-care plan  (06/02/2009)   Hypertension self-care plan printed.  Process Orders Check Orders Results:     [facility]: [id number] not required for this insurance Tests Sent for requisitioning (June 03, 2009 4:16 PM):     06/02/2009: [facility] -- T-CBC w/Diff [id number] (signed)     06/02/2009: [facility] -- T-Comprehensive Metabolic Panel [id number] (signed)     Vital Signs:  Patient profile:   40 year old female Height:       61 inches (154.94 cm) Weight:      226.04 pounds (102.75 kg) O2 Sat:      95 % Temp:     98.4 degrees F (36.89 degrees C) oral Pulse rate:   126 / minute BP sitting:   157 / 107  (right arm)  Vitals Entered By: Sander Nephew RN (June 02, 2009 3:44 PM)  O2 Flow:  Room air

## 2010-06-02 NOTE — Assessment & Plan Note (Signed)
Summary: Soc. Work  20 minutes.  Patient with sarcoidosis.  Oxygen 24/7.   Met with patient for status update on Disability and Medicaid.  Jorita receives $911 per month in SSD.  She has two children.  Lives with sister who is not considered part of household. She needs to submit further paperwork to qualify for Medicaid.  She recd about $5,000 in backpay.    Reports dental issues and Regino Schultze has done a referral to the [facility].  I've advised Ms. Reeder that once she gets her Medicaid card, please call here and we will give her list of dentists that will make access to dental much easier.     Card given should she have any other obstacles to her Medicaid.   Appended Document: Soc. Work Ms. Carlos Levering is Insurance account manager.

## 2010-06-02 NOTE — Progress Notes (Signed)
Summary: med refill/gp  Phone Note Refill Request Message from:  Fax from Pharmacy on April 19, 2010 10:00 AM  Refills Requested: Medication #1:  OMEPRAZOLE 20 MG CPDR Take one 30-60 min before first and last meals of the day  Method Requested: Fax to [facility] Initial call taken by: Morrison Old RN,  April 19, 2010 10:00 AM    Prescriptions: OMEPRAZOLE 20 MG CPDR (OMEPRAZOLE) Take one 30-60 min before first and last meals of the day  #60 x 6   Entered and Authorized by:   Rudie Meyer MD   Signed by:   Rudie Meyer MD on 04/20/2010   Method used:   Faxed to ...       [facility] Department (retail)       [address] [place], [place]  [address]       Ph: [phone]       Fax: [phone]   RxID:   [id number]

## 2010-06-02 NOTE — Assessment & Plan Note (Signed)
Summary: EST-CK/FU/MEDS/CFB   Vital Signs:  Patient profile:   40 year old female Height:      61 inches (154.94 cm) Weight:      214.01 pounds (97.28 kg) O2 Sat:      100 % on 2 L/min Temp:     97.8 degrees F (36.56 degrees C) oral Pulse rate:   116 / minute BP sitting:   141 / 80  (right arm) Cuff size:   regular  Vitals Entered By: Sander Nephew RN (May 03, 2010 1:58 PM)  O2 Flow:  2 L/min  Primary Care Provider:  Junius Finner  MD   History of Present Illness: 40 yr old woman with pmhx as described below comes to the clinic for diabetes management. Patient has no complains. Patient has no felt any hypoglycemic events since September.   Last time patient saw Dr. Melvyn Novas in August 2011. She was supposed to return on September but did not make it to appointment.   Pack/ 3days. Would like chantix.   Patient did not take her HTN medication today.    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 Take One 30-60 Min Before First and Last Meals of The Day 2)  Novolin 70/30 70-30 % Susp (Insulin Isophane & Regular) .... Inject 30 Units 30 Minutes Before Breakfast and 30 Units 30 Minutes Before Evening Meal. 3)  Insulin Syringe/needle 28g X 1/2" 0.5 Ml Misc (Insulin Syringe-Needle U-100) .... Per Instruction 4)  Freestyle Lite Test  Strp (Glucose Blood) .... Use To Check Blood Sugar 3x Daily 5)  Lancets  Misc (Lancets) .... Use To Check Blood Sugar Three Times Daily 6)  Prednisone 20 Mg Tabs (Prednisone) .... Take 1/2 Tablet By Mouth Every Other Day 7)  Ferrous Sulfate 325 (65 Fe) Mg Tabs (Ferrous Sulfate) .... Take 1 Tablet By Mouth Two Times A Day 8)  Furosemide 40 Mg Tabs (Furosemide) .Marland Kitchen.. 1 Tablet By Mouth Daily 9)  Diltiazem Hcl Coated Beads 240 Mg Xr24h-Cap (Diltiazem Hcl Coated Beads) .... Take 1 Capsule By Mouth Once A Day 10)  Metformin Hcl 500 Mg Tabs (Metformin Hcl) .Marland Kitchen.. 1 Two Times A Day 11)  Dulera 200-5 Mcg/act Aero (Mometasone Furo-Formoterol Fum) .... 2 Puffs First Thing  in Am and 2 Puffs Again in Pm About 12 Hours Later 12)  Proventil Hfa 108 (90 Base) Mcg/act  Aers (Albuterol Sulfate) .... 2 Puffs Every 4-6 Hrs As Needed  Current Medications (verified): 1)  Omeprazole 20 Mg Cpdr (Omeprazole) .... Take One 30-60 Min Before First and Last Meals of The Day 2)  Novolin 70/30 70-30 % Susp (Insulin Isophane & Regular) .... Inject 30 Units 30 Minutes Before Breakfast and 30 Units 30 Minutes Before Evening Meal. 3)  Insulin Syringe/needle 28g X 1/2" 0.5 Ml Misc (Insulin Syringe-Needle U-100) .... Per Instruction 4)  Freestyle Lite Test  Strp (Glucose Blood) .... Use To Check Blood Sugar 3x Daily 5)  Lancets  Misc (Lancets) .... Use To Check Blood Sugar Three Times Daily 6)  Prednisone 20 Mg Tabs (Prednisone) .... Take 1/2 Tablet By  Mouth Every Other Day 7)  Ferrous Sulfate 325 (65 Fe) Mg Tabs (Ferrous Sulfate) .... Take 1 Tablet By Mouth Two Times A Day 8)  Furosemide 40 Mg Tabs (Furosemide) .Marland Kitchen.. 1 Tablet By Mouth Daily 9)  Diltiazem Hcl Coated Beads 240 Mg Xr24h-Cap (Diltiazem Hcl Coated Beads) .... Take 1 Capsule By Mouth Once A Day 10)  Metformin Hcl 500 Mg Tabs (Metformin Hcl) .Marland Kitchen.. 1 Two Times A Day 11)  Dulera 200-5 Mcg/act Aero (Mometasone Furo-Formoterol Fum) .... 2 Puffs First Thing  in Am and 2 Puffs Again in Pm About 12 Hours Later  Allergies: No Known Drug Allergies  Past History:  Past Medical History: Last updated: 02/09/2010 Sarcoidosis (Dr Wert)-w/ liver involvement per biopsy 12/09 Unexplained Hypoxemia July 2011     - CT angiogram 01/07/10 >>> no PE      - PFT's February 09, 2010 FEV1  1.20 (49%) with 16% better p B2,  DLC0 33% > corrects to 84 QT prolongation Diabetes mellitus, type II Hypertension Heart catheterization 06-06-06 : No CAD, no RAS, normal EF Hx eclampsia Hx of scalp seborrheic dermatitis Abnormal LFT's:   Alk phos 713, TB 1.3, AST 52, ALT 18 in 11/09- Liver u/s and exam c/w HSM.  Hep B                               serology neg but Hep C ab +, HIV neg.  Coags slightly prolonged and alb low.  Started                                             empirically on prednisone for GI sarcoid and tapering.  AMA and Hep C viral load neg.                 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  Smokes 1/2 ppd x 20 years.  She denies illegal drugs and alcohol use.    Risk Factors: Alcohol Use: 0 (01/19/2010) Exercise: yes (01/19/2010)  Risk Factors: Smoking Status: quit (01/19/2010) Packs/Day: 1/3 (01/19/2010)  Family History: Reviewed history from 01/06/2010 and no changes required. Children-healthy  Mom-Colon ca Father-MS Asthma is sister in childhood   Social History: Reviewed history from 04/06/2009 and no changes required. She is single, two children that are healthy.  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  Smokes 1/2 ppd x 20 years.  She denies illegal drugs and alcohol use.    Review of Systems  The patient denies fever, chest pain, syncope, peripheral edema, prolonged cough, headaches, hemoptysis, abdominal pain, melena, hematochezia, severe indigestion/heartburn, hematuria, muscle weakness, difficulty walking, and abnormal bleeding.    Physical Exam  General:  NAD on oxygen, vitals reviewed Mouth:  MMM Neck:  supple.   Lungs:  normal respiratory effort and normal breath sounds.   Heart:  regular rhythm, no murmur, no gallop, no rub, and tachycardia.   Abdomen:  soft and non-tender.   Msk:  normal ROM.   Extremities:  trace edema bilaterally Neurologic:  alert & oriented X3 and cranial nerves II-XII intact.    Diabetes Management Exam:    Foot Exam (with socks and/or shoes not present):       Sensory-Monofilament:          Left foot: normal          Right foot: normal   Impression & Recommendations:  Problem # 1:  SARCOIDOSIS (ICD-135) Stable. Will have patient follow up with Dr. Melvyn Novas. Appointment was made for patient will in the clinic. Will follow up.  Problem # 2:  DIABETES MELLITUS, TYPE II, UNCONTROLLED (ICD-250.02) Controlled. Continue current regimen. Diabetic foot exam done today. Will referr for Diabetic Eye exam on 02/12.  Her updated medication list for this problem includes:    Novolin 70/30 70-30 % Susp (Insulin isophane &  regular) ..... Inject 30 units 30 minutes before breakfast and 30 units 30 minutes before evening meal.    Metformin Hcl 500 Mg Tabs (Metformin hcl) .Marland Kitchen... 1 two times a day  Orders: T- Capillary Blood Glucose [id number]) T-Hgb A1C (in-house) [id number])  Labs Reviewed: Creat: 0.72 (01/21/2010)     Last Eye Exam: Mild non-proliferative diabetic retinopathy.  OD  (06/17/2009) Reviewed HgBA1c results: 6.0 (05/03/2010)  5.2 (01/19/2010)  Problem # 3:  HYPERTENSION (ICD-401.9) Uncontrolled today due to patient not taking medication. Continue current regimen.  Her updated medication list for this problem includes:    Furosemide 40 Mg Tabs (Furosemide) .Marland Kitchen... 1 tablet by mouth daily    Diltiazem Hcl Coated Beads 240 Mg Xr24h-cap (Diltiazem hcl coated beads) .Marland Kitchen... Take 1 capsule by mouth once a day  BP today: 141/80 Prior BP: 120/80 (02/09/2010)  Labs Reviewed: K+: 4.1 (01/21/2010) Creat: : 0.72 (01/21/2010)   Chol: 185 (01/21/2010)   HDL: 43 (01/21/2010)   LDL: 109 (01/21/2010)   TG: 163 (01/21/2010)  Problem # 4:  CONGESTIVE HEART FAILURE (ICD-428.0) No signs of volume overload. Continue current regimen.  Her updated medication list for this problem includes:    Furosemide 40 Mg Tabs (Furosemide) .Marland Kitchen... 1 tablet by mouth daily  Problem # 5:  NICOTINE ADDICTION (ICD-305.1) Encouraged smoking cessation and discussed different methods for smoking cessation. Started patient on chantix. Will follow up.  Her updated medication list for this problem includes:    Chantix 0.5 Mg Tabs (Varenicline tartrate) .Marland Kitchen... Take 1 tablet by mouth once a day x 3 days, then one tablet by mouth two times a day x 4 days, then 2 tablets by mouth two times a day x 10 weeks    Problem # 6:  Preventive Health Care (ICD-V70.0) Pap smear and Mammogram ordered  today.  Complete Medication List: 1)  Omeprazole 20 Mg Cpdr (Omeprazole) .... Take one 30-60 min before first and last meals of the day 2)  Novolin 70/30  70-30 % Susp (Insulin isophane & regular) .... Inject 30 units 30 minutes before breakfast and 30 units 30 minutes before evening meal. 3)  Insulin Syringe/needle 28g X 1/2" 0.5 Ml Misc (Insulin syringe-needle u-100) .... Per instruction 4)  Freestyle Lite Test Strp (Glucose blood) .... Use to check blood sugar 3x daily 5)  Lancets Misc (Lancets) .... Use to check blood sugar three times daily 6)  Prednisone 20 Mg Tabs (Prednisone) .... Take 1/2 tablet by mouth every other day 7)  Ferrous Sulfate 325 (65 Fe) Mg Tabs (Ferrous sulfate) .... Take 1 tablet by mouth two times a day 8)  Furosemide 40 Mg Tabs (Furosemide) .Marland Kitchen.. 1 tablet by mouth daily 9)  Diltiazem Hcl Coated Beads 240 Mg Xr24h-cap (Diltiazem hcl coated beads) .... Take 1 capsule by mouth once a day 10)  Metformin Hcl 500 Mg Tabs (Metformin hcl) .Marland Kitchen.. 1 two times a day 11)  Dulera 200-5 Mcg/act Aero (Mometasone furo-formoterol fum) .... 2 puffs first thing  in am and 2 puffs again in pm about 12 hours later 12)  Chantix 0.5 Mg Tabs (Varenicline tartrate) .... Take 1 tablet by mouth once a day x 3 days, then one tablet by mouth two times a day x 4 days, then 2 tablets by mouth two times a day x 10 weeks  Other Orders: Mammogram (Screening) (Mammo) Gynecologic Referral (Gyn)  Patient Instructions: 1)  Please schedule a follow-up appointment in 3 months. 2)  Continue to take all medications as directed. 3)  Make an appointment with Dr. Melvyn Novas.  Prescriptions: CHANTIX 0.5 MG TABS (VARENICLINE TARTRATE) Take 1 tablet by mouth once a day X 3 days, then one tablet by mouth two times a day X 4 days, then 2 tablets by mouth two times a day X 10 weeks  #224 x 2   Entered and Authorized by:   Rudie Meyer MD   Signed by:   Rudie Meyer MD on 05/03/2010   Method used:   Faxed to ...       [facility] (retail)       [address] [place], [place]  [address]       Ph: [phone]       Fax:  [phone]   RxID:   [id number]    Orders Added: 1)  T- Capillary Blood Glucose [82948] 2)  T-Hgb A1C (in-house) [id number] 3)  Mammogram (Screening) [Mammo] 4)  Gynecologic Referral [Gyn] 5)  Est. Patient Level III [99213]    Laboratory Results   Blood Tests   Date/Time Received: May 03, 2010 2:19 PM Date/Time Reported: Maryan Rued  May 03, 2010 2:19 PM   HGBA1C: 6.0%   (Normal Range: Non-Diabetic - 3-6%   Control Diabetic - 6-8%) CBG Random:: 173mg /dL     Prevention & Chronic Care Immunizations   Influenza vaccine: Historical  (03/01/2009)   Influenza vaccine deferral: Deferred  (01/19/2010)   Influenza vaccine due: 12/30/2009    Tetanus booster: Not documented   Td booster deferral: Deferred  (05/06/2009)    Pneumococcal vaccine: Historical  (03/01/2009)   Pneumococcal vaccine deferral: Deferred  (10/12/2009)  Other Screening   Pap smear: NEGATIVE FOR INTRAEPITHELIAL LESIONS OR MALIGNANCY.  (03/11/2008)   Pap smear action/deferral: GYN Referral  (05/03/2010)  Mammogram: BI-RADS CATEGORY 2:  Benign finding(s).^MM DIGITAL DIAG LTD R  (04/20/2009)   Mammogram action/deferral: Ordered  (05/03/2010)   Smoking status: quit  (01/19/2010)  Diabetes Mellitus   HgbA1C: 6.0  (05/03/2010)    Eye exam: Mild non-proliferative diabetic retinopathy.  OD   (06/17/2009)   Diabetic eye exam action/deferral: Ophthalmology referral  (02/09/2009)   Eye exam due: 12/2009    Foot exam: yes  (05/03/2010)   Foot exam action/deferral: Do today   High risk foot: No  (05/03/2010)   Foot care education: Done  (02/09/2009)   Foot exam due: 02/09/2010    Urine microalbumin/creatinine ratio: 25.0  (08/12/2009)   Urine microalbumin action/deferral: Ordered    Diabetes flowsheet reviewed?: Yes   Progress toward A1C goal: At goal  Lipids   Total Cholesterol: 185  (01/21/2010)   Lipid panel action/deferral: LDL Direct Ordered   LDL: 109  (01/21/2010)   LDL  Direct: 126  (02/09/2009)   HDL: 43  (01/21/2010)   Triglycerides: 163  (01/21/2010)   Lipid panel due: 01/07/2010  Hypertension   Last Blood Pressure: 141 / 80  (05/03/2010)   Serum creatinine: 0.72  (01/21/2010)   BMP action: Ordered   Serum potassium 4.1  (01/21/2010)    Hypertension flowsheet reviewed?: Yes   Progress toward BP goal: Unchanged  Self-Management Support :   Personal Goals (by the next clinic visit) :     Personal A1C goal: 7  (02/09/2009)     Personal blood pressure goal: 130/80  (01/21/2009)     Personal LDL goal: 100  (05/06/2009)    Patient will work on the following items until the next clinic visit to reach self-care goals:     Medications and monitoring: take my medicines every day, check my blood sugar, bring all of my medications to every visit  (05/03/2010)     Eating: eat more vegetables, use fresh or frozen vegetables, eat foods that are low in salt, eat baked foods instead of fried foods  (05/03/2010)     Activity: join a walking program  (12/14/2009)     Other: walking when not too short of breath  (12/14/2009)    Diabetes self-management support: Written self-care plan  (05/03/2010)   Diabetes care plan printed   Last diabetes self-management training by diabetes educator: 01/28/2009    Hypertension self-management support: Written self-care plan  (05/03/2010)   Hypertension self-care plan printed.   Nursing Instructions: Gyn referral for screening Pap (see order) Schedule screening mammogram (see order)     Last LDL:                                                 109 (01/21/2010 6:50:00 PM)        Diabetic Foot Exam Foot Inspection Is there a history of a foot ulcer?              No Is there a foot ulcer now?              No Can the patient see the bottom of their feet?          Yes Are the shoes appropriate in style and fit?          Yes Is there swelling or an abnormal foot shape?          No Are the  toenails long?                 No Are the toenails thick?                Yes Are the toenails ingrown?              No Is there heavy callous build-up?              Yes Is there a claw toe deformity?                          No Is there elevated skin temperature?            No Is there limited ankle dorsiflexion?            No Is there foot or ankle muscle weakness?            No Do you have pain in calf while walking?           No      Pulse Check          Right Foot          Left Foot Posterior Tibial:        3+            3+ Dorsalis Pedis:        3+            3+ Comments: Dryness over foot.  Callus build up on the heels and sides of both feet.   High Risk Feet? No   10-g (5.07) Semmes-Weinstein Monofilament Test Performed by: Sander Nephew RN          Right Foot          Left Foot Visual Inspection               Test Control      normal         normal Site 1         normal         normal Site 2         normal         normal Site 3         normal         normal Site 4         normal         normal Site 5         normal         normal Site 6         normal         normal Site 7         normal         normal Site 8         normal         normal Site 9         normal         normal Site 10         normal         normal  Impression      normal         normal

## 2010-06-02 NOTE — Assessment & Plan Note (Signed)
Summary: Pulmonary/ f/u sarcoidosis  02 sats ok on 4lpm x 3 laps   Primary Provider/Referring Provider:  Junius Finner  MD  CC:  Dyspnea- the same.  History of Present Illness: 40 yobf quit smoking July 2011 p dx of sarcoidosis in 2001 = sob, cough  became 02 dep  in July 2011 last off prednsione March 2011 but daily since then and complicated by [facility] detected   January 06, 2010 Pt states has been w/o Furosemide since  9/3  and is c/o increased SOB. Pt states outpt clinic advised her to follow up with Dr. Melvyn Novas for Sacoidosis. doe on 02 at 2 lpm sitting then 4 with activity gets off at the curb struggles with grocery store. Try taking Prednisone one half even days in am with breakfast Be sure you take omeprazole Take one 30-60 min before first and last meals of the day Stop nifedipine Start Cardizem 240 mg once daily Wear 24 hours per day, 2 lpm at rest and sleeping, 4lpm with activity, this is the best way to help your heart function better CT Chest ( repeated with contrast)> no PE, just sarcoid changes  February 09, 2010 Followup sarcoid w/ PFTs.  Breathing is the same- no better or worse.on 02 2 lpm w/in  and then outside using 4lpm>  cc doe x 50 ft still has to stop every aisle at [employer] but not [facility] parking.  rec Start Dulera 2 puffs first thing  in am and 2 puffs again in pm about 12 hours later and prednisone 10 mg every other day.  May 04, 2010 ov cc doe some better to point to where feels needs 02 less.  No cough or rash articular or occular complaints.  Pt denies any significant sore throat, dysphagia, itching, sneezing,  nasal congestion or excess secretions,  fever, chills, sweats, unintended wt loss, pleuritic or exertional cp, hempoptysis, change in activity tolerance  orthopnea pnd or leg swelling.     Current Medications (verified): 1)  Omeprazole 20 Mg Cpdr (Omeprazole) .... Take One 30-60 Min Before First and Last Meals of The Day 2)  Novolin 70/30 70-30 % Susp  (Insulin Isophane & Regular) .... Inject 30 Units 30 Minutes Before Breakfast and 30 Units 30 Minutes Before Evening Meal. 3)  Insulin Syringe/needle 28g X 1/2" 0.5 Ml Misc (Insulin Syringe-Needle U-100) .... Per Instruction 4)  Freestyle Lite Test  Strp (Glucose Blood) .... Use To Check Blood Sugar 3x Daily 5)  Lancets  Misc (Lancets) .... Use To Check Blood Sugar Three Times Daily 6)  Prednisone 20 Mg Tabs (Prednisone) .... Take 1/2 Tablet By Mouth Every Other Day 7)  Ferrous Sulfate 325 (65 Fe) Mg Tabs (Ferrous Sulfate) .... Take 1 Tablet By Mouth Two Times A Day 8)  Furosemide 40 Mg Tabs (Furosemide) .Marland Kitchen.. 1 Tablet By Mouth Daily 9)  Diltiazem Hcl Coated Beads 240 Mg Xr24h-Cap (Diltiazem Hcl Coated Beads) .... Take 1 Capsule By Mouth Once A Day 10)  Metformin Hcl 500 Mg Tabs (Metformin Hcl) .Marland Kitchen.. 1 Two Times A Day 11)  Dulera 200-5 Mcg/act Aero (Mometasone Furo-Formoterol Fum) .... 2 Puffs First Thing  in Am and 2 Puffs Again in Pm About 12 Hours Later 12)  Chantix 0.5 Mg Tabs (Varenicline Tartrate) .... Take 1 Tablet By Mouth Once A Day X 3 Days, Then One Tablet By Mouth Two Times A Day X 4 Days, Then 2 Tablets By Mouth Two Times A Day X 10 Weeks  Allergies (  verified): No Known Drug Allergies  Past History:  Past Medical History: Sarcoidosis (Dr Stephenia Vogan)-w/ liver involvement per biopsy 12/09     - Reversible airway component so start [facility] 01/2010 > better     - HFa 75% p coaching May 04, 2010  Unexplained Hypoxemia July 2011     - CT angiogram 01/07/10 >>> no PE      - PFT's February 09, 2010 FEV1  1.20 (49%) with 16% better p B2,  DLC0 33% > corrects to 84     - 02 sats ok on 4lpm x rapid walk x 3 laps May 04, 2010  Morbid obesity      - Target wt  =  153  for BMI < 30  QT prolongation Diabetes mellitus, type II Hypertension Heart catheterization 06-06-06 : No CAD, no RAS, normal EF Hx eclampsia Hx of scalp seborrheic dermatitis Abnormal LFT's:   Alk phos 713, TB 1.3, AST 52,  ALT 18 in 11/09- Liver u/s and exam c/w HSM.  Hep B                               serology neg but Hep C ab +, HIV neg.  Coags slightly prolonged and alb low.  Started                                             empirically on prednisone for GI sarcoid and tapering.  AMA and Hep C viral load neg.                 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 (neg complement, ANA, etc 12/10) Diabetic retinopathy-R eye 02/11  Vital Signs:  Patient profile:   40 year old female Weight:      217 pounds O2 Sat:      98 % on 2 L/min Temp:     98.2 degrees F oral Pulse rate:   135 / minute BP sitting:   124 / 88  (right arm) Cuff size:   large  Vitals Entered By: Tilden Dome (May 04, 2010 9:37 AM)  O2 Flow:  2 L/min  Physical Exam  Additional Exam:  She is a cushingoid-appearing amb bf nad on 02 2lpm wt   238 January 06, 2010  > 222 February 09, 2010 > 217 May 04, 2010  There is no stigmata liver disease skin: anicteric HEENT: normocephalic; PEERLA; no nasal or pharyngeal abnormalities neck: supple nodes: no cervical lymphadenopathy chest: clear to ausculatation and percussion heart: no murmurs, gallops, or rubs abd: soft, nontender; BS normoactive; no abdominal masses, tenderness, organomegaly; abdomen is obese rectal: deferred ext: no cynanosis, clubbing, edema     Impression & Recommendations:  Problem # 1:  SARCOIDOSIS (ICD-135) The goal with a chronic steroid dependent illness is always arriving at the lowest effective dose that controls the disease/symptoms and not accepting a set "formula" which is based on statistics that don't take into accound individual variability or the natural hx of the dz in every individual patient, which may well vary over time.   Try new floor of 5mg  every other day plus continue dulera 200  2bid for airway  involvement  I spent extra time with the patient today explaining optimal mdi  technique.  This improved from  50-75%  Problem # 2:  RESPIRATORY FAILURE, ACUTE (ICD-518.81)  02 dep chronically secondary to sarcoid and morbid obesity  On 4lpm > adequate sats with rapid walk so needs to use this with ex to help burn  calories and prevent worse pah by using the 02 2lpm around the clock otherwise   Each maintenance medication was reviewed in detail including most importantly the difference between maintenance and as needed and under what circumstances the prns are to be used. See instructions for specific recommendations   Orders: Est. Patient Level IV [id number])  Medications Added to Medication List This Visit: 1)  Prednisone 20 Mg Tabs (Prednisone) .... Take 1/4  tablet by mouth every other day  Patient Instructions: 1)  Stay on dulera 200 2 puffs first thing  in am and 2 puffs again in pm about 12 hours later  2)  Work on perfecting inhaler technique:  relax and blow all the way out then take a nice smooth deep breath back in, triggering the inhaler at same time you start breathing  3)  Prednisone 20 mg 1/4 every other day 4)  Stay on 02 2lpm 24 hours per day except increase to 4lpm with exercise  5)  Weight control is simply a matter of calorie balance which needs to be tilted in your favor by eating less and exercising more.  To get the most out of exercise, you need to be continuously aware that you are short of breath, but never out of breath, for 30 minutes daily @ 4lpm. As you improve, it will actually be easier for you to do the same amount in  30 minutes so always push to the level where you are short of breath.  If this does not result in gradual weight reduction,  I recommend  a nutritionist for a food diary Prescriptions: PREDNISONE 20 MG TABS (PREDNISONE) Take 1/4  tablet by mouth every other day  #30 x 1   Entered and Authorized by:   Tanda Rockers MD   Signed by:   Tanda Rockers MD on 05/04/2010   Method used:   Print then Give to Patient   RxID:   [id number]   Appended Document: Pulmonary/ f/u sarcoidosis  02 sats ok on 4lpm x 3 laps copy to Melody Finner, MD  Appended Document: Pulmonary/ f/u sarcoidosis  02 sats ok on 4lpm x 3 laps Ambulatory Pulse Oximetry  Resting; HR__138___    02 Sat__97%4lpm___  Lap1 (185 feet)   HR__91___   02 Sat__94%4lpm___ Lap2 (185 feet)   HR__127___   02 Sat__97%4lpm___    Lap3 (185 feet)   HR__104___   02 Sat__98%4lpm___  ___Test Completed without Difficulty ___Test Stopped due to:

## 2010-06-03 NOTE — Letter (Signed)
Summary: BLOOD SUGAR/ [facility]  BLOOD SUGAR/ [facility]   Imported By: Garlan Fillers 08/13/2009 13:55:51  _____________________________________________________________________  External Attachment:    Type:   Image     Comment:   External Document

## 2010-06-08 NOTE — Progress Notes (Signed)
Summary: Medication change.  Phone Note Refill Request Message from:  Fax from Pharmacy on May 30, 2010 2:06 PM  Refills Requested: Medication #1:  OMEPRAZOLE 20 MG CPDR Take one 30-60 min before first and last meals of the day   Last Refilled: 05/26/2010 Pharmacy can provide 40 mg Nexium @ no charge for pt.  Last office visit was 05/03/2010.  Last labs were 01/21/2010.   Method Requested: Fax to [place] Initial call taken by: Sander Nephew RN,  May 30, 2010 2:06 PM    New/Updated Medications: NEXIUM 20 MG CPDR (ESOMEPRAZOLE MAGNESIUM) Take 1 tablet by mouth once a day Prescriptions: NEXIUM 20 MG CPDR (ESOMEPRAZOLE MAGNESIUM) Take 1 tablet by mouth once a day  #30 x 11   Entered and Authorized by:   Milta Deiters MD   Signed by:   Milta Deiters MD on 05/30/2010   Method used:   Faxed to ...       [facility] (retail)       [address] [place], [place]  [address]       Ph: [phone]       Fax: [phone]   RxID:   [id number]

## 2010-06-15 ENCOUNTER — Telehealth: Payer: Self-pay | Admitting: Internal Medicine

## 2010-06-16 ENCOUNTER — Encounter: Payer: Self-pay | Admitting: Obstetrics and Gynecology

## 2010-06-22 NOTE — Progress Notes (Signed)
Summary: wants portable oxygen tank > ok  Phone Note Call from Patient   Caller: Patient Call For: Nyhla Mountjoy Summary of Call: patient spoke with [facility] she would like portable oxygen and [facility] told her they would need the order from Dr Melvyn Novas. Patient can be reached at [phone] or [phone]. She wants to know if Dr Melvyn Novas will give her this because it is easier for her to carry around. Initial call taken by: Ozella Rocks,  June 15, 2010 10:41 AM  Follow-up for Phone Call        pt is requesting an order be sent for portable oxygen tank. Please advise if ok to send order.Davey Bing CMA  June 15, 2010 12:23 PM ok see last ov re 02 4lpm with ex Follow-up by: Tanda Rockers MD,  June 15, 2010 1:15 PM  Additional Follow-up for Phone Call Additional follow up Details #1::        order placed.[place] Bing CMA  June 15, 2010 3:42 PM

## 2010-07-11 ENCOUNTER — Other Ambulatory Visit: Payer: Self-pay | Admitting: *Deleted

## 2010-07-11 LAB — GLUCOSE, CAPILLARY: Glucose-Capillary: 173 mg/dL — ABNORMAL HIGH (ref 70–99)

## 2010-07-11 MED ORDER — FUROSEMIDE 40 MG PO TABS: ORAL_TABLET | ORAL | Status: DC

## 2010-07-11 MED ORDER — METFORMIN HCL 1000 MG PO TABS: 1000.0000 mg | ORAL_TABLET | Freq: Two times a day (BID) | ORAL | Status: DC

## 2010-07-14 LAB — GLUCOSE, CAPILLARY
Glucose-Capillary: 113 mg/dL — ABNORMAL HIGH (ref 70–99)
Glucose-Capillary: 149 mg/dL — ABNORMAL HIGH (ref 70–99)

## 2010-07-15 LAB — GLUCOSE, CAPILLARY
Glucose-Capillary: 100 mg/dL — ABNORMAL HIGH (ref 70–99)
Glucose-Capillary: 165 mg/dL — ABNORMAL HIGH (ref 70–99)

## 2010-07-16 LAB — CBC
HCT: 32.1 % — ABNORMAL LOW (ref 36.0–46.0)
HCT: 32.4 % — ABNORMAL LOW (ref 36.0–46.0)
HCT: 35 % — ABNORMAL LOW (ref 36.0–46.0)
Hemoglobin: 10.3 g/dL — ABNORMAL LOW (ref 12.0–15.0)
Hemoglobin: 10.3 g/dL — ABNORMAL LOW (ref 12.0–15.0)
Hemoglobin: 11.3 g/dL — ABNORMAL LOW (ref 12.0–15.0)
MCH: 25.5 pg — ABNORMAL LOW (ref 26.0–34.0)
MCH: 25.7 pg — ABNORMAL LOW (ref 26.0–34.0)
MCH: 25.8 pg — ABNORMAL LOW (ref 26.0–34.0)
MCHC: 31.9 g/dL (ref 30.0–36.0)
MCHC: 32.1 g/dL (ref 30.0–36.0)
MCHC: 32.2 g/dL (ref 30.0–36.0)
MCV: 79.7 fL (ref 78.0–100.0)
MCV: 80 fL (ref 78.0–100.0)
MCV: 80.5 fL (ref 78.0–100.0)
Platelets: 197 10*3/uL (ref 150–400)
Platelets: 236 10*3/uL (ref 150–400)
Platelets: 246 10*3/uL (ref 150–400)
RBC: 4.02 MIL/uL (ref 3.87–5.11)
RBC: 4.04 MIL/uL (ref 3.87–5.11)
RBC: 4.35 MIL/uL (ref 3.87–5.11)
RDW: 17.3 % — ABNORMAL HIGH (ref 11.5–15.5)
RDW: 17.5 % — ABNORMAL HIGH (ref 11.5–15.5)
RDW: 17.6 % — ABNORMAL HIGH (ref 11.5–15.5)
WBC: 5.9 10*3/uL (ref 4.0–10.5)
WBC: 6.7 10*3/uL (ref 4.0–10.5)
WBC: 6.8 10*3/uL (ref 4.0–10.5)

## 2010-07-16 LAB — BLOOD GAS, ARTERIAL
Acid-Base Excess: 1.8 mmol/L (ref 0.0–2.0)
Bicarbonate: 26.4 mEq/L — ABNORMAL HIGH (ref 20.0–24.0)
Drawn by: 222511
FIO2: 0.21 %
O2 Saturation: 86 %
Patient temperature: 98.6
TCO2: 27.8 mmol/L (ref 0–100)
pCO2 arterial: 45.7 mmHg — ABNORMAL HIGH (ref 35.0–45.0)
pH, Arterial: 7.38 (ref 7.350–7.400)
pO2, Arterial: 55.4 mmHg — ABNORMAL LOW (ref 80.0–100.0)

## 2010-07-16 LAB — URINALYSIS, ROUTINE W REFLEX MICROSCOPIC
Glucose, UA: NEGATIVE mg/dL
Ketones, ur: NEGATIVE mg/dL
Nitrite: NEGATIVE
Protein, ur: 100 mg/dL — AB
Specific Gravity, Urine: 1.024 (ref 1.005–1.030)
Urobilinogen, UA: 1 mg/dL (ref 0.0–1.0)
pH: 5.5 (ref 5.0–8.0)

## 2010-07-16 LAB — FERRITIN: Ferritin: 60 ng/mL (ref 10–291)

## 2010-07-16 LAB — VITAMIN B12: Vitamin B-12: 527 pg/mL (ref 211–911)

## 2010-07-16 LAB — COMPREHENSIVE METABOLIC PANEL
ALT: 19 U/L (ref 0–35)
AST: 30 U/L (ref 0–37)
Albumin: 2.8 g/dL — ABNORMAL LOW (ref 3.5–5.2)
Alkaline Phosphatase: 219 U/L — ABNORMAL HIGH (ref 39–117)
BUN: 11 mg/dL (ref 6–23)
CO2: 28 mEq/L (ref 19–32)
Calcium: 8.9 mg/dL (ref 8.4–10.5)
Chloride: 99 mEq/L (ref 96–112)
Creatinine, Ser: 0.79 mg/dL (ref 0.4–1.2)
GFR calc Af Amer: >60 mL/min (ref >60)
GFR calc non Af Amer: >60 mL/min (ref >60)
Glucose, Bld: 269 mg/dL — ABNORMAL HIGH (ref 70–99)
Potassium: 4.7 mEq/L (ref 3.5–5.1)
Sodium: 135 mEq/L (ref 135–145)
Total Bilirubin: 0.9 mg/dL (ref 0.3–1.2)
Total Protein: 7.6 g/dL (ref 6.0–8.3)

## 2010-07-16 LAB — GLUCOSE, CAPILLARY
Glucose-Capillary: 115 mg/dL — ABNORMAL HIGH (ref 70–99)
Glucose-Capillary: 118 mg/dL — ABNORMAL HIGH (ref 70–99)
Glucose-Capillary: 136 mg/dL — ABNORMAL HIGH (ref 70–99)
Glucose-Capillary: 140 mg/dL — ABNORMAL HIGH (ref 70–99)
Glucose-Capillary: 142 mg/dL — ABNORMAL HIGH (ref 70–99)
Glucose-Capillary: 150 mg/dL — ABNORMAL HIGH (ref 70–99)
Glucose-Capillary: 162 mg/dL — ABNORMAL HIGH (ref 70–99)
Glucose-Capillary: 177 mg/dL — ABNORMAL HIGH (ref 70–99)
Glucose-Capillary: 196 mg/dL — ABNORMAL HIGH (ref 70–99)
Glucose-Capillary: 208 mg/dL — ABNORMAL HIGH (ref 70–99)
Glucose-Capillary: 215 mg/dL — ABNORMAL HIGH (ref 70–99)
Glucose-Capillary: 290 mg/dL — ABNORMAL HIGH (ref 70–99)
Glucose-Capillary: 311 mg/dL — ABNORMAL HIGH (ref 70–99)

## 2010-07-16 LAB — IRON AND TIBC
Iron: 17 ug/dL — ABNORMAL LOW (ref 42–135)
Saturation Ratios: 4 % — ABNORMAL LOW (ref 20–55)
TIBC: 378 ug/dL (ref 250–470)
UIBC: 361 ug/dL

## 2010-07-16 LAB — URINE CULTURE: Colony Count: 3000

## 2010-07-16 LAB — PHOSPHORUS: Phosphorus: 4.4 mg/dL (ref 2.3–4.6)

## 2010-07-16 LAB — BASIC METABOLIC PANEL
BUN: 10 mg/dL (ref 6–23)
BUN: 15 mg/dL (ref 6–23)
CO2: 27 mEq/L (ref 19–32)
CO2: 30 mEq/L (ref 19–32)
Calcium: 9.3 mg/dL (ref 8.4–10.5)
Calcium: 9.3 mg/dL (ref 8.4–10.5)
Chloride: 98 mEq/L (ref 96–112)
Chloride: 99 mEq/L (ref 96–112)
Creatinine, Ser: 0.73 mg/dL (ref 0.4–1.2)
Creatinine, Ser: 0.83 mg/dL (ref 0.4–1.2)
GFR calc Af Amer: >60 mL/min (ref >60)
GFR calc Af Amer: >60 mL/min (ref >60)
GFR calc non Af Amer: >60 mL/min (ref >60)
GFR calc non Af Amer: >60 mL/min (ref >60)
Glucose, Bld: 201 mg/dL — ABNORMAL HIGH (ref 70–99)
Glucose, Bld: 99 mg/dL (ref 70–99)
Potassium: 4.1 mEq/L (ref 3.5–5.1)
Potassium: 4.9 mEq/L (ref 3.5–5.1)
Sodium: 134 mEq/L — ABNORMAL LOW (ref 135–145)
Sodium: 137 mEq/L (ref 135–145)

## 2010-07-16 LAB — DIFFERENTIAL
Basophils Absolute: 0 10*3/uL (ref 0.0–0.1)
Basophils Relative: 0 % (ref 0–1)
Eosinophils Absolute: 0.3 10*3/uL (ref 0.0–0.7)
Eosinophils Relative: 4 % (ref 0–5)
Lymphocytes Relative: 25 % (ref 12–46)
Lymphs Abs: 1.7 10*3/uL (ref 0.7–4.0)
Monocytes Absolute: 0.7 10*3/uL (ref 0.1–1.0)
Monocytes Relative: 11 % (ref 3–12)
Neutro Abs: 4.1 10*3/uL (ref 1.7–7.7)
Neutrophils Relative %: 60 % (ref 43–77)

## 2010-07-16 LAB — RETICULOCYTES
RBC.: 4.1 MIL/uL (ref 3.87–5.11)
Retic Count, Absolute: 77.9 10*3/uL (ref 19.0–186.0)
Retic Ct Pct: 1.9 % (ref 0.4–3.1)

## 2010-07-16 LAB — POCT PREGNANCY, URINE: Preg Test, Ur: NEGATIVE

## 2010-07-16 LAB — BRAIN NATRIURETIC PEPTIDE: Pro B Natriuretic peptide (BNP): 932 pg/mL — ABNORMAL HIGH (ref 0.0–100.0)

## 2010-07-16 LAB — MAGNESIUM: Magnesium: 1.4 mg/dL — ABNORMAL LOW (ref 1.5–2.5)

## 2010-07-16 LAB — URINE MICROSCOPIC-ADD ON

## 2010-07-16 LAB — FOLATE: Folate: 8.3 ng/mL

## 2010-07-18 LAB — GLUCOSE, CAPILLARY
Glucose-Capillary: 131 mg/dL — ABNORMAL HIGH (ref 70–99)
Glucose-Capillary: 138 mg/dL — ABNORMAL HIGH (ref 70–99)
Glucose-Capillary: 141 mg/dL — ABNORMAL HIGH (ref 70–99)
Glucose-Capillary: 156 mg/dL — ABNORMAL HIGH (ref 70–99)
Glucose-Capillary: 162 mg/dL — ABNORMAL HIGH (ref 70–99)
Glucose-Capillary: 162 mg/dL — ABNORMAL HIGH (ref 70–99)
Glucose-Capillary: 167 mg/dL — ABNORMAL HIGH (ref 70–99)
Glucose-Capillary: 168 mg/dL — ABNORMAL HIGH (ref 70–99)
Glucose-Capillary: 182 mg/dL — ABNORMAL HIGH (ref 70–99)
Glucose-Capillary: 184 mg/dL — ABNORMAL HIGH (ref 70–99)
Glucose-Capillary: 199 mg/dL — ABNORMAL HIGH (ref 70–99)
Glucose-Capillary: 227 mg/dL — ABNORMAL HIGH (ref 70–99)

## 2010-07-18 LAB — BASIC METABOLIC PANEL
BUN: 16 mg/dL (ref 6–23)
BUN: 5 mg/dL — ABNORMAL LOW (ref 6–23)
CO2: 27 mEq/L (ref 19–32)
CO2: 27 mEq/L (ref 19–32)
Calcium: 9.2 mg/dL (ref 8.4–10.5)
Calcium: 9.4 mg/dL (ref 8.4–10.5)
Chloride: 100 mEq/L (ref 96–112)
Chloride: 102 mEq/L (ref 96–112)
Creatinine, Ser: 0.86 mg/dL (ref 0.4–1.2)
Creatinine, Ser: 1.55 mg/dL — ABNORMAL HIGH (ref 0.4–1.2)
GFR calc Af Amer: 45 mL/min — ABNORMAL LOW (ref >60)
GFR calc Af Amer: >60 mL/min (ref >60)
GFR calc non Af Amer: 37 mL/min — ABNORMAL LOW (ref >60)
GFR calc non Af Amer: >60 mL/min (ref >60)
Glucose, Bld: 140 mg/dL — ABNORMAL HIGH (ref 70–99)
Glucose, Bld: 151 mg/dL — ABNORMAL HIGH (ref 70–99)
Potassium: 4 mEq/L (ref 3.5–5.1)
Potassium: 4.4 mEq/L (ref 3.5–5.1)
Sodium: 136 mEq/L (ref 135–145)
Sodium: 136 mEq/L (ref 135–145)

## 2010-07-18 LAB — TSH: TSH: 2.078 u[IU]/mL (ref 0.350–4.500)

## 2010-07-18 LAB — AMYLASE: Amylase: 65 U/L (ref 0–105)

## 2010-07-18 LAB — URINALYSIS, MICROSCOPIC ONLY
Bilirubin Urine: NEGATIVE
Glucose, UA: NEGATIVE mg/dL
Ketones, ur: NEGATIVE mg/dL
Nitrite: NEGATIVE
Protein, ur: NEGATIVE mg/dL
Specific Gravity, Urine: 1.016 (ref 1.005–1.030)
Urobilinogen, UA: 1 mg/dL (ref 0.0–1.0)
pH: 5 (ref 5.0–8.0)

## 2010-07-18 LAB — CBC
HCT: 33.1 % — ABNORMAL LOW (ref 36.0–46.0)
HCT: 34.6 % — ABNORMAL LOW (ref 36.0–46.0)
Hemoglobin: 11.1 g/dL — ABNORMAL LOW (ref 12.0–15.0)
Hemoglobin: 11.8 g/dL — ABNORMAL LOW (ref 12.0–15.0)
MCHC: 33.6 g/dL (ref 30.0–36.0)
MCHC: 34.1 g/dL (ref 30.0–36.0)
MCV: 85 fL (ref 78.0–100.0)
MCV: 85.3 fL (ref 78.0–100.0)
Platelets: 171 10*3/uL (ref 150–400)
Platelets: 173 10*3/uL (ref 150–400)
RBC: 3.9 MIL/uL (ref 3.87–5.11)
RBC: 4.06 MIL/uL (ref 3.87–5.11)
RDW: 16 % — ABNORMAL HIGH (ref 11.5–15.5)
RDW: 16.1 % — ABNORMAL HIGH (ref 11.5–15.5)
WBC: 5 10*3/uL (ref 4.0–10.5)
WBC: 5.7 10*3/uL (ref 4.0–10.5)

## 2010-07-18 LAB — CREATININE, URINE, RANDOM: Creatinine, Urine: 165.6 mg/dL

## 2010-07-18 LAB — CORTISOL: Cortisol, Plasma: 2.6 ug/dL

## 2010-07-18 LAB — SODIUM, URINE, RANDOM: Sodium, Ur: 66 mEq/L

## 2010-07-20 LAB — GLUCOSE, CAPILLARY
Glucose-Capillary: 259 mg/dL — ABNORMAL HIGH (ref 70–99)
Glucose-Capillary: 282 mg/dL — ABNORMAL HIGH (ref 70–99)
Glucose-Capillary: 416 mg/dL — ABNORMAL HIGH (ref 70–99)
Glucose-Capillary: 117 mg/dL — ABNORMAL HIGH (ref 70–99)

## 2010-07-22 LAB — GLUCOSE, CAPILLARY: Glucose-Capillary: 263 mg/dL — ABNORMAL HIGH (ref 70–99)

## 2010-07-28 ENCOUNTER — Ambulatory Visit (INDEPENDENT_AMBULATORY_CARE_PROVIDER_SITE_OTHER): Payer: Medicaid Other | Admitting: Internal Medicine

## 2010-07-28 ENCOUNTER — Encounter: Payer: Self-pay | Admitting: Internal Medicine

## 2010-07-28 VITALS — BP 139/90 | HR 61 | Temp 98.4°F | Ht 61.0 in | Wt 297.9 lb

## 2010-07-28 DIAGNOSIS — Z0000 Encounter for general adult medical examination without abnormal findings: Secondary | ICD-10-CM

## 2010-07-28 DIAGNOSIS — I509 Heart failure, unspecified: Secondary | ICD-10-CM

## 2010-07-28 DIAGNOSIS — IMO0001 Reserved for inherently not codable concepts without codable children: Secondary | ICD-10-CM

## 2010-07-28 DIAGNOSIS — I10 Essential (primary) hypertension: Secondary | ICD-10-CM

## 2010-07-28 DIAGNOSIS — R059 Cough, unspecified: Secondary | ICD-10-CM

## 2010-07-28 DIAGNOSIS — D869 Sarcoidosis, unspecified: Secondary | ICD-10-CM

## 2010-07-28 DIAGNOSIS — D631 Anemia in chronic kidney disease: Secondary | ICD-10-CM

## 2010-07-28 DIAGNOSIS — N189 Chronic kidney disease, unspecified: Secondary | ICD-10-CM

## 2010-07-28 DIAGNOSIS — M25552 Pain in left hip: Secondary | ICD-10-CM

## 2010-07-28 DIAGNOSIS — R05 Cough: Secondary | ICD-10-CM

## 2010-07-28 DIAGNOSIS — M25559 Pain in unspecified hip: Secondary | ICD-10-CM

## 2010-07-28 DIAGNOSIS — K219 Gastro-esophageal reflux disease without esophagitis: Secondary | ICD-10-CM

## 2010-07-28 DIAGNOSIS — F17200 Nicotine dependence, unspecified, uncomplicated: Secondary | ICD-10-CM

## 2010-07-28 DIAGNOSIS — E1139 Type 2 diabetes mellitus with other diabetic ophthalmic complication: Secondary | ICD-10-CM

## 2010-07-28 LAB — COMPREHENSIVE METABOLIC PANEL
ALT: 14 U/L (ref 0–35)
AST: 25 U/L (ref 0–37)
Albumin: 3.6 g/dL (ref 3.5–5.2)
Alkaline Phosphatase: 166 U/L — ABNORMAL HIGH (ref 39–117)
BUN: 13 mg/dL (ref 6–23)
CO2: 25 mEq/L (ref 19–32)
Calcium: 9.4 mg/dL (ref 8.4–10.5)
Chloride: 103 mEq/L (ref 96–112)
Creat: 0.69 mg/dL (ref 0.40–1.20)
Glucose, Bld: 80 mg/dL (ref 70–99)
Potassium: 3.6 mEq/L (ref 3.5–5.3)
Sodium: 143 mEq/L (ref 135–145)
Total Bilirubin: 0.6 mg/dL (ref 0.3–1.2)
Total Protein: 8.7 g/dL — ABNORMAL HIGH (ref 6.0–8.3)

## 2010-07-28 LAB — LIPID PANEL
Cholesterol: 158 mg/dL (ref 0–200)
HDL: 35 mg/dL — ABNORMAL LOW (ref >39)
LDL Cholesterol: 86 mg/dL (ref 0–99)
Total CHOL/HDL Ratio: 4.5 Ratio
Triglycerides: 187 mg/dL — ABNORMAL HIGH (ref <150)
VLDL: 37 mg/dL (ref 0–40)

## 2010-07-28 LAB — CBC
HCT: 33 % — ABNORMAL LOW (ref 36.0–46.0)
Hemoglobin: 10.3 g/dL — ABNORMAL LOW (ref 12.0–15.0)
MCH: 26.8 pg (ref 26.0–34.0)
MCHC: 31.2 g/dL (ref 30.0–36.0)
MCV: 85.9 fL (ref 78.0–100.0)
Platelets: 238 10*3/uL (ref 150–400)
RBC: 3.84 MIL/uL — ABNORMAL LOW (ref 3.87–5.11)
RDW: 17.1 % — ABNORMAL HIGH (ref 11.5–15.5)
WBC: 8 10*3/uL (ref 4.0–10.5)

## 2010-07-28 LAB — POCT GLYCOSYLATED HEMOGLOBIN (HGB A1C): Hemoglobin A1C: 6

## 2010-07-28 LAB — CALCIUM, IONIZED: Calcium, Ion: 1.31 mmol/L (ref 1.12–1.32)

## 2010-07-28 LAB — GLUCOSE, CAPILLARY: Glucose-Capillary: 85 mg/dL (ref 70–99)

## 2010-07-28 MED ORDER — BENZONATATE 100 MG PO CAPS: 100.0000 mg | ORAL_CAPSULE | Freq: Four times a day (QID) | ORAL | Status: DC | PRN

## 2010-07-28 NOTE — Patient Instructions (Signed)
Make an appointment in 2 weeks. Take all medications as directed.

## 2010-07-28 NOTE — Progress Notes (Signed)
  Subjective:    Patient ID: Melody Gray, female    DOB: 1970-10-01, 40 y.o.   MRN: [id number]  HPI  40 yr old woman with  Past Medical History  Diagnosis Date  . Sarcoidosis     Followed by Dr. Melvyn Novas; w/ liver involvement per biopsy 12/09, Reversible airway component so started on [facility] 01/2010; HFA 75% p coaching 05/2010  . Hypoxemia     CT angiogram 9/11>> No PE; PFTs 10/11- FEV1 1.20 (49%) with 16% better p B2, DLCO 33%> corrects to 84; O2 sats ok on 4 lpm X rapid walk X 3 laps 05/2010  . Morbid obesity     Target wt= 153 for BMI <30  . QT prolongation   . Diabetes mellitus   . Hypertension   . Hx of cardiac cath 2/08    No CAD, no RAS,  normal EF  . Seborrheic dermatitis of scalp   . 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 Hep B serology neg. but Hep C ab +, HIV neg. 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 Cardiomyopathy, nonischemic     EF 45% 12/10; Echo 7/11 normal EF, PAS 48  . Diabetic retinopathy     Right eye 2/11  . Health maintenance examination     Mammogram 05/2010 Negative; Last Pap smear 03/2008; Last DM eye exam 2/11> mild non-proliferative diabetic retinopathy. OD   comes to the clinic complaining of cough. Sometimes produces whitish phlegm. Associated with rhinorrhea> clear. Denies fever/chills, shortness of breath, chest pain, palpitations, diaphoresis, or headache.  Patient also reports to have hip pain for the last 2 weeks. Denies trauma/fall. Hip pain constant aggravated at night and on weight bearing. Described as sharp. 8/10 intensity.   Patient saw Dr. Melvyn Novas on January. He instructed patient to continue using oxygen and to decrease prednisone to 5mg  every other day.   DM: Reports to have good control.   Tobacco abuse: Has not filled chantix. Continue to smoke. Is willing to try patches as they work for her.   Review of Systems  All other systems reviewed and are  negative.       Objective:   Physical Exam  Constitutional: She is oriented to person, place, and time. No distress.       Central obesity  HENT:  Mouth/Throat: Oropharynx is clear and moist. No oropharyngeal exudate.       Moon face  Eyes: Conjunctivae and EOM are normal. Pupils are equal, round, and reactive to light.  Neck: Normal range of motion. Neck supple.  Cardiovascular: Normal rate, regular rhythm and normal heart sounds.   Pulmonary/Chest: Effort normal and breath sounds normal.  Abdominal: Soft. Bowel sounds are normal.  Musculoskeletal: Normal range of motion. She exhibits no tenderness.       Trace edema bilaterally Pain on external rotation of the hip. No pain on palpation of left trochanteric bursa  Neurological: She is alert and oriented to person, place, and time.  Skin: She is not diaphoretic.  Psychiatric: She has a normal mood and affect.          Assessment & Plan:

## 2010-07-29 LAB — PTH, INTACT AND CALCIUM
Calcium, Total (PTH): 9.5 mg/dL (ref 8.4–10.5)
PTH: 14.9 pg/mL (ref 14.0–72.0)

## 2010-08-01 LAB — CBC
HCT: 33.5 % — ABNORMAL LOW (ref 36.0–46.0)
HCT: 34.7 % — ABNORMAL LOW (ref 36.0–46.0)
HCT: 38 % (ref 36.0–46.0)
HCT: 39.6 % (ref 36.0–46.0)
Hemoglobin: 11 g/dL — ABNORMAL LOW (ref 12.0–15.0)
Hemoglobin: 11.5 g/dL — ABNORMAL LOW (ref 12.0–15.0)
Hemoglobin: 12.5 g/dL (ref 12.0–15.0)
Hemoglobin: 13.2 g/dL (ref 12.0–15.0)
MCHC: 32.9 g/dL (ref 30.0–36.0)
MCHC: 33 g/dL (ref 30.0–36.0)
MCHC: 33 g/dL (ref 30.0–36.0)
MCHC: 33.3 g/dL (ref 30.0–36.0)
MCV: 85.6 fL (ref 78.0–100.0)
MCV: 85.9 fL (ref 78.0–100.0)
MCV: 86 fL (ref 78.0–100.0)
MCV: 86.1 fL (ref 78.0–100.0)
Platelets: 261 10*3/uL (ref 150–400)
Platelets: 266 10*3/uL (ref 150–400)
Platelets: 274 10*3/uL (ref 150–400)
Platelets: 292 10*3/uL (ref 150–400)
RBC: 3.92 MIL/uL (ref 3.87–5.11)
RBC: 4.03 MIL/uL (ref 3.87–5.11)
RBC: 4.41 MIL/uL (ref 3.87–5.11)
RBC: 4.61 MIL/uL (ref 3.87–5.11)
RDW: 15.9 % — ABNORMAL HIGH (ref 11.5–15.5)
RDW: 16.2 % — ABNORMAL HIGH (ref 11.5–15.5)
RDW: 16.5 % — ABNORMAL HIGH (ref 11.5–15.5)
RDW: 16.8 % — ABNORMAL HIGH (ref 11.5–15.5)
WBC: 5.9 10*3/uL (ref 4.0–10.5)
WBC: 6.1 10*3/uL (ref 4.0–10.5)
WBC: 6.8 10*3/uL (ref 4.0–10.5)
WBC: 7.3 10*3/uL (ref 4.0–10.5)

## 2010-08-01 LAB — ANTI-DNA ANTIBODY, DOUBLE-STRANDED: ds DNA Ab: <1 IU/mL (ref <5)

## 2010-08-01 LAB — BASIC METABOLIC PANEL
BUN: 11 mg/dL (ref 6–23)
BUN: 12 mg/dL (ref 6–23)
BUN: 14 mg/dL (ref 6–23)
CO2: 29 mEq/L (ref 19–32)
CO2: 38 mEq/L — ABNORMAL HIGH (ref 19–32)
CO2: 39 mEq/L — ABNORMAL HIGH (ref 19–32)
Calcium: 10.3 mg/dL (ref 8.4–10.5)
Calcium: 9.4 mg/dL (ref 8.4–10.5)
Calcium: 9.5 mg/dL (ref 8.4–10.5)
Chloride: 103 mEq/L (ref 96–112)
Chloride: 87 mEq/L — ABNORMAL LOW (ref 96–112)
Chloride: 92 mEq/L — ABNORMAL LOW (ref 96–112)
Creatinine, Ser: 0.67 mg/dL (ref 0.4–1.2)
Creatinine, Ser: 0.77 mg/dL (ref 0.4–1.2)
Creatinine, Ser: 0.78 mg/dL (ref 0.4–1.2)
GFR calc Af Amer: >60 mL/min (ref >60)
GFR calc Af Amer: >60 mL/min (ref >60)
GFR calc Af Amer: >60 mL/min (ref >60)
GFR calc non Af Amer: >60 mL/min (ref >60)
GFR calc non Af Amer: >60 mL/min (ref >60)
GFR calc non Af Amer: >60 mL/min (ref >60)
Glucose, Bld: 104 mg/dL — ABNORMAL HIGH (ref 70–99)
Glucose, Bld: 154 mg/dL — ABNORMAL HIGH (ref 70–99)
Glucose, Bld: 219 mg/dL — ABNORMAL HIGH (ref 70–99)
Potassium: 3.4 mEq/L — ABNORMAL LOW (ref 3.5–5.1)
Potassium: 3.9 mEq/L (ref 3.5–5.1)
Potassium: 4.3 mEq/L (ref 3.5–5.1)
Sodium: 136 mEq/L (ref 135–145)
Sodium: 139 mEq/L (ref 135–145)
Sodium: 140 mEq/L (ref 135–145)

## 2010-08-01 LAB — TSH: TSH: 0.588 u[IU]/mL (ref 0.350–4.500)

## 2010-08-01 LAB — GLUCOSE, CAPILLARY
Glucose-Capillary: 155 mg/dL — ABNORMAL HIGH (ref 70–99)
Glucose-Capillary: 178 mg/dL — ABNORMAL HIGH (ref 70–99)
Glucose-Capillary: 179 mg/dL — ABNORMAL HIGH (ref 70–99)
Glucose-Capillary: 220 mg/dL — ABNORMAL HIGH (ref 70–99)
Glucose-Capillary: 264 mg/dL — ABNORMAL HIGH (ref 70–99)
Glucose-Capillary: 282 mg/dL — ABNORMAL HIGH (ref 70–99)
Glucose-Capillary: 301 mg/dL — ABNORMAL HIGH (ref 70–99)
Glucose-Capillary: 312 mg/dL — ABNORMAL HIGH (ref 70–99)
Glucose-Capillary: 340 mg/dL — ABNORMAL HIGH (ref 70–99)
Glucose-Capillary: 420 mg/dL — ABNORMAL HIGH (ref 70–99)
Glucose-Capillary: 95 mg/dL (ref 70–99)
Glucose-Capillary: 97 mg/dL (ref 70–99)

## 2010-08-01 LAB — POCT CARDIAC MARKERS
CKMB, poc: <1 ng/mL — ABNORMAL LOW (ref 1.0–8.0)
Myoglobin, poc: 36 ng/mL (ref 12–200)
Troponin i, poc: <0.05 ng/mL (ref 0.00–0.09)

## 2010-08-01 LAB — ANA: Anti Nuclear Antibody(ANA): NEGATIVE

## 2010-08-01 LAB — PROTEIN ELECTROPH W RFLX QUANT IMMUNOGLOBULINS
Albumin ELP: 40.1 % — ABNORMAL LOW (ref 55.8–66.1)
Alpha-1-Globulin: 6.8 % — ABNORMAL HIGH (ref 2.9–4.9)
Alpha-2-Globulin: 10.9 % (ref 7.1–11.8)
Beta 2: 10 % — ABNORMAL HIGH (ref 3.2–6.5)
Beta Globulin: 7.7 % — ABNORMAL HIGH (ref 4.7–7.2)
Gamma Globulin: 24.5 % — ABNORMAL HIGH (ref 11.1–18.8)
M-Spike, %: NOT DETECTED g/dL
Total Protein ELP: 7.1 g/dL (ref 6.0–8.3)

## 2010-08-01 LAB — DIFFERENTIAL
Basophils Absolute: 0 10*3/uL (ref 0.0–0.1)
Basophils Relative: 0 % (ref 0–1)
Eosinophils Absolute: 0.1 10*3/uL (ref 0.0–0.7)
Eosinophils Relative: 2 % (ref 0–5)
Lymphocytes Relative: 33 % (ref 12–46)
Lymphs Abs: 2 10*3/uL (ref 0.7–4.0)
Monocytes Absolute: 0.6 10*3/uL (ref 0.1–1.0)
Monocytes Relative: 9 % (ref 3–12)
Neutro Abs: 3.4 10*3/uL (ref 1.7–7.7)
Neutrophils Relative %: 56 % (ref 43–77)

## 2010-08-01 LAB — COMPREHENSIVE METABOLIC PANEL
ALT: 19 U/L (ref 0–35)
AST: 21 U/L (ref 0–37)
Albumin: 2.6 g/dL — ABNORMAL LOW (ref 3.5–5.2)
Alkaline Phosphatase: 185 U/L — ABNORMAL HIGH (ref 39–117)
BUN: 16 mg/dL (ref 6–23)
CO2: 30 mEq/L (ref 19–32)
Calcium: 9.6 mg/dL (ref 8.4–10.5)
Chloride: 98 mEq/L (ref 96–112)
Creatinine, Ser: 0.77 mg/dL (ref 0.4–1.2)
GFR calc Af Amer: >60 mL/min (ref >60)
GFR calc non Af Amer: >60 mL/min (ref >60)
Glucose, Bld: 249 mg/dL — ABNORMAL HIGH (ref 70–99)
Potassium: 4 mEq/L (ref 3.5–5.1)
Sodium: 138 mEq/L (ref 135–145)
Total Bilirubin: 0.4 mg/dL (ref 0.3–1.2)
Total Protein: 7.3 g/dL (ref 6.0–8.3)

## 2010-08-01 LAB — HIV ANTIBODY (ROUTINE TESTING W REFLEX): HIV: NONREACTIVE

## 2010-08-01 LAB — D-DIMER, QUANTITATIVE: D-Dimer, Quant: 3.34 ug/mL-FEU — ABNORMAL HIGH (ref 0.00–0.48)

## 2010-08-01 LAB — LIPID PANEL
Cholesterol: 179 mg/dL (ref 0–200)
HDL: 35 mg/dL — ABNORMAL LOW (ref >39)
LDL Cholesterol: 104 mg/dL — ABNORMAL HIGH (ref 0–99)
Total CHOL/HDL Ratio: 5.1 RATIO
Triglycerides: 200 mg/dL — ABNORMAL HIGH (ref <150)
VLDL: 40 mg/dL (ref 0–40)

## 2010-08-01 LAB — BRAIN NATRIURETIC PEPTIDE: Pro B Natriuretic peptide (BNP): 1061 pg/mL — ABNORMAL HIGH (ref 0.0–100.0)

## 2010-08-01 LAB — C4 COMPLEMENT: Complement C4, Body Fluid: 52 mg/dL — ABNORMAL HIGH (ref 16–47)

## 2010-08-01 LAB — C3 COMPLEMENT: C3 Complement: 194 mg/dL (ref 88–201)

## 2010-08-01 LAB — RPR: RPR Ser Ql: NONREACTIVE

## 2010-08-02 ENCOUNTER — Encounter: Payer: Self-pay | Admitting: Internal Medicine

## 2010-08-02 DIAGNOSIS — Z0000 Encounter for general adult medical examination without abnormal findings: Secondary | ICD-10-CM | POA: Insufficient documentation

## 2010-08-02 DIAGNOSIS — M25552 Pain in left hip: Secondary | ICD-10-CM | POA: Insufficient documentation

## 2010-08-02 LAB — GLUCOSE, CAPILLARY: Glucose-Capillary: 374 mg/dL — ABNORMAL HIGH (ref 70–99)

## 2010-08-02 NOTE — Assessment & Plan Note (Signed)
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 Will order MRI of the Hip and follow up.

## 2010-08-02 NOTE — Assessment & Plan Note (Addendum)
Patient prefers to go to [facility] for Pap smear.  Check lipid panel.

## 2010-08-02 NOTE — Assessment & Plan Note (Signed)
Check CBC and renal function today.

## 2010-08-02 NOTE — Assessment & Plan Note (Signed)
No signs of volume overload. Continue current regimen. 

## 2010-08-02 NOTE — Assessment & Plan Note (Signed)
Opthalmology referral done for Diabetic Eye Exam.

## 2010-08-02 NOTE — Assessment & Plan Note (Signed)
At goal. Continue current regimen. 

## 2010-08-02 NOTE — Assessment & Plan Note (Signed)
May be 2/2 sarcoidosis. Will check ionized calcium, cmet, and PTH. If elevated due to sarcoid may need to have prednisone increased.

## 2010-08-02 NOTE — Assessment & Plan Note (Signed)
Discussed use of nicotine patches.

## 2010-08-02 NOTE — Assessment & Plan Note (Signed)
Stable  Continue current regimen  

## 2010-08-02 NOTE — Assessment & Plan Note (Signed)
Continue per Dr. Melvyn Novas

## 2010-08-03 LAB — GLUCOSE, CAPILLARY: Glucose-Capillary: 264 mg/dL — ABNORMAL HIGH (ref 70–99)

## 2010-08-04 ENCOUNTER — Encounter: Payer: Self-pay | Admitting: Internal Medicine

## 2010-08-04 LAB — GLUCOSE, CAPILLARY: Glucose-Capillary: 468 mg/dL — ABNORMAL HIGH (ref 70–99)

## 2010-08-05 LAB — GLUCOSE, CAPILLARY: Glucose-Capillary: >600 mg/dL (ref 70–99)

## 2010-08-07 LAB — GLUCOSE, CAPILLARY: Glucose-Capillary: 390 mg/dL — ABNORMAL HIGH (ref 70–99)

## 2010-08-10 LAB — CORTISOL: Cortisol, Plasma: 7.6 ug/dL

## 2010-08-10 LAB — COMPREHENSIVE METABOLIC PANEL
ALT: 31 U/L (ref 0–35)
ALT: 38 U/L — ABNORMAL HIGH (ref 0–35)
AST: 65 U/L — ABNORMAL HIGH (ref 0–37)
AST: 77 U/L — ABNORMAL HIGH (ref 0–37)
Albumin: 2 g/dL — ABNORMAL LOW (ref 3.5–5.2)
Albumin: 2.1 g/dL — ABNORMAL LOW (ref 3.5–5.2)
Alkaline Phosphatase: 370 U/L — ABNORMAL HIGH (ref 39–117)
Alkaline Phosphatase: 430 U/L — ABNORMAL HIGH (ref 39–117)
BUN: 10 mg/dL (ref 6–23)
BUN: 11 mg/dL (ref 6–23)
CO2: 23 mEq/L (ref 19–32)
CO2: 24 mEq/L (ref 19–32)
Calcium: 9.2 mg/dL (ref 8.4–10.5)
Calcium: 9.3 mg/dL (ref 8.4–10.5)
Chloride: 102 mEq/L (ref 96–112)
Chloride: 99 mEq/L (ref 96–112)
Creatinine, Ser: 0.87 mg/dL (ref 0.4–1.2)
Creatinine, Ser: 0.87 mg/dL (ref 0.4–1.2)
GFR calc Af Amer: >60 mL/min (ref >60)
GFR calc Af Amer: >60 mL/min (ref >60)
GFR calc non Af Amer: >60 mL/min (ref >60)
GFR calc non Af Amer: >60 mL/min (ref >60)
Glucose, Bld: 77 mg/dL (ref 70–99)
Glucose, Bld: 94 mg/dL (ref 70–99)
Potassium: 3.9 mEq/L (ref 3.5–5.1)
Potassium: 4.3 mEq/L (ref 3.5–5.1)
Sodium: 132 mEq/L — ABNORMAL LOW (ref 135–145)
Sodium: 136 mEq/L (ref 135–145)
Total Bilirubin: 1.8 mg/dL — ABNORMAL HIGH (ref 0.3–1.2)
Total Bilirubin: 1.9 mg/dL — ABNORMAL HIGH (ref 0.3–1.2)
Total Protein: 7.7 g/dL (ref 6.0–8.3)
Total Protein: 7.8 g/dL (ref 6.0–8.3)

## 2010-08-10 LAB — PROTIME-INR
INR: 1.3 (ref 0.00–1.49)
Prothrombin Time: 16.2 seconds — ABNORMAL HIGH (ref 11.6–15.2)

## 2010-08-10 LAB — CBC
HCT: 32.4 % — ABNORMAL LOW (ref 36.0–46.0)
HCT: 34.3 % — ABNORMAL LOW (ref 36.0–46.0)
Hemoglobin: 11.1 g/dL — ABNORMAL LOW (ref 12.0–15.0)
Hemoglobin: 11.7 g/dL — ABNORMAL LOW (ref 12.0–15.0)
MCHC: 34.1 g/dL (ref 30.0–36.0)
MCHC: 34.3 g/dL (ref 30.0–36.0)
MCV: 83.9 fL (ref 78.0–100.0)
MCV: 83.9 fL (ref 78.0–100.0)
Platelets: 210 10*3/uL (ref 150–400)
Platelets: 231 10*3/uL (ref 150–400)
RBC: 3.86 MIL/uL — ABNORMAL LOW (ref 3.87–5.11)
RBC: 4.08 MIL/uL (ref 3.87–5.11)
RDW: 15.7 % — ABNORMAL HIGH (ref 11.5–15.5)
RDW: 16.6 % — ABNORMAL HIGH (ref 11.5–15.5)
WBC: 5.9 10*3/uL (ref 4.0–10.5)
WBC: 6.7 10*3/uL (ref 4.0–10.5)

## 2010-08-10 LAB — IRON AND TIBC
Iron: 59 ug/dL (ref 42–135)
Saturation Ratios: 20 % (ref 20–55)
TIBC: 288 ug/dL (ref 250–470)
UIBC: 229 ug/dL

## 2010-08-10 LAB — URINALYSIS, ROUTINE W REFLEX MICROSCOPIC
Bilirubin Urine: NEGATIVE
Glucose, UA: NEGATIVE mg/dL
Hgb urine dipstick: NEGATIVE
Ketones, ur: NEGATIVE mg/dL
Nitrite: NEGATIVE
Protein, ur: NEGATIVE mg/dL
Specific Gravity, Urine: 1.012 (ref 1.005–1.030)
Urobilinogen, UA: 1 mg/dL (ref 0.0–1.0)
pH: 5.5 (ref 5.0–8.0)

## 2010-08-10 LAB — CULTURE, BLOOD (ROUTINE X 2)
Culture: NO GROWTH
Culture: NO GROWTH

## 2010-08-10 LAB — HEMOGLOBIN A1C
Hgb A1c MFr Bld: 6 % (ref 4.6–6.1)
Mean Plasma Glucose: 126 mg/dL

## 2010-08-10 LAB — LIPASE, BLOOD: Lipase: 62 U/L — ABNORMAL HIGH (ref 11–59)

## 2010-08-10 LAB — CK TOTAL AND CKMB (NOT AT ARMC)
CK, MB: 0.5 ng/mL (ref 0.3–4.0)
Relative Index: INVALID (ref 0.0–2.5)
Total CK: 15 U/L (ref 7–177)

## 2010-08-10 LAB — FERRITIN: Ferritin: 344 ng/mL — ABNORMAL HIGH (ref 10–291)

## 2010-08-10 LAB — RETICULOCYTES
RBC.: 3.93 MIL/uL (ref 3.87–5.11)
Retic Count, Absolute: 82.5 10*3/uL (ref 19.0–186.0)
Retic Ct Pct: 2.1 % (ref 0.4–3.1)

## 2010-08-10 LAB — GLUCOSE, CAPILLARY
Glucose-Capillary: 147 mg/dL — ABNORMAL HIGH (ref 70–99)
Glucose-Capillary: 83 mg/dL (ref 70–99)
Glucose-Capillary: 89 mg/dL (ref 70–99)

## 2010-08-10 LAB — DIFFERENTIAL
Basophils Absolute: 0 10*3/uL (ref 0.0–0.1)
Basophils Relative: 0 % (ref 0–1)
Eosinophils Absolute: 0.2 10*3/uL (ref 0.0–0.7)
Eosinophils Relative: 3 % (ref 0–5)
Lymphocytes Relative: 16 % (ref 12–46)
Lymphs Abs: 1.1 10*3/uL (ref 0.7–4.0)
Monocytes Absolute: 1 10*3/uL (ref 0.1–1.0)
Monocytes Relative: 15 % — ABNORMAL HIGH (ref 3–12)
Neutro Abs: 4.5 10*3/uL (ref 1.7–7.7)
Neutrophils Relative %: 67 % (ref 43–77)

## 2010-08-10 LAB — APTT: aPTT: 45 seconds — ABNORMAL HIGH (ref 24–37)

## 2010-08-10 LAB — FOLATE: Folate: 15.3 ng/mL

## 2010-08-10 LAB — VITAMIN B12: Vitamin B-12: 541 pg/mL (ref 211–911)

## 2010-08-10 LAB — URINE CULTURE
Colony Count: NO GROWTH
Culture: NO GROWTH

## 2010-08-10 LAB — PREGNANCY, URINE: Preg Test, Ur: NEGATIVE

## 2010-08-10 LAB — TSH: TSH: 2.044 u[IU]/mL (ref 0.350–4.500)

## 2010-08-10 LAB — TROPONIN I: Troponin I: 0.03 ng/mL (ref 0.00–0.06)

## 2010-08-10 LAB — AMMONIA: Ammonia: 38 umol/L — ABNORMAL HIGH (ref 11–35)

## 2010-08-10 LAB — LACTIC ACID, PLASMA: Lactic Acid, Venous: 2.6 mmol/L — ABNORMAL HIGH (ref 0.5–2.2)

## 2010-08-15 ENCOUNTER — Ambulatory Visit: Payer: Medicaid Other | Admitting: Ophthalmology

## 2010-08-25 ENCOUNTER — Encounter: Payer: Medicaid Other | Admitting: Family

## 2010-09-13 NOTE — Assessment & Plan Note (Signed)
[facility]                             PULMONARY OFFICE NOTE   NAME:Melody Gray, Melody Gray                       MRN:          [id number]  DATE:10/04/2006                            DOB:          03/31/1971    HISTORY:  A 40 year old, black female seen on May 7, actively smoking  with complaints of cramping in her hands from prednisone. She had been  on prednisone at 60 mg per day after hospitalization at [facility]  and was still on 40 mg per day when I saw her on May 7 and recommended  she taper it off. However, within a week of stopping the prednisone she  had increasing swelling and pain in her feet with a rash over the  anterior surface of her shin. She does also have mild increased dyspnea  and cough.   MEDICATIONS:  1. Benicar 20 mg 1 daily.  2. Famotidine 20 mg b.i.d.   PHYSICAL EXAMINATION:  GENERAL:  She is an ambulatory, obese, black  female in no acute distress.  VITAL SIGNS:  Temperature is 100 degrees, otherwise normal vital signs.  HEENT:  Unremarkable. Pharynx clear. Dentition intact.  NECK:  Supple without cervical adenopathy or tenderness. The trachea is  midline, no thyromegaly.  LUNGS:  Lung fields reveal inspiratory and expiratory rhonchi but these  are minimal.  HEART:  There is a regular rate and rhythm without murmur, gallop or  rub.  ABDOMEN:  Soft and benign.  EXTREMITIES:  Warm without calf tenderness, cyanosis, clubbing or edema.   Chest x-ray reveals classic sarcoid changes that is reticular and  nodular infiltrates associated with bulky adenopathy.   Lab studies are pending.   IMPRESSION:  This patient clearly has evidence of sarcoid by skin exam  with a flare up of also arthritis and symptomatic cough and dyspnea that  all may be related to the fact that prednisone was tapered off.  Therefore the prednisone needs to be tapered to the lowest effective  dose and I recommended the following strategies:  Start 10 mg  tablets 2  until 100% back to baseline and then 1 daily until she returns here for  followup in 4 weeks, sooner if needed.     Christena Deem. Melvyn Novas, MD, [facility]  Electronically Signed    MBW/MedQ  DD: 10/04/2006  DT: 10/05/2006  Job #: [id number]   cc:   [facility]

## 2010-09-13 NOTE — Discharge Summary (Signed)
NAMEFEDRA, SATURNO NO.:  [id number]   MEDICAL RECORD NO.:  [id number]          PATIENT TYPE:  INP   LOCATION:  [id number]                         FACILITY:  [facility]   PHYSICIAN:  Felicity Pellegrini, MD     DATE OF BIRTH:  [birth year]   DATE OF ADMISSION:  08/08/2008  DATE OF DISCHARGE:  08/09/2008                               DISCHARGE SUMMARY   DISCHARGE DIAGNOSES:  1. Gastrointestinal/liver sarcoidosis and pulmonary sarcoidosis,      followed by Dr. Melvyn Novas and Dr. Deatra Ina diagnosed in 2007.  2. 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  A1c in November 2009 was 5.0.  3. Hypertension.  4. Cardiac catheterization on June 06, 2006, showed no coronary      artery disease, no renal artery stenosis, and a normal ejection      fraction.  5. History of QT prolongation.  6. History of eclampsia with her pregnancies.  7. History of scalp seborrheic dermatitis.  8. History of abnormal LFTs.  Liver biopsy on February 2009 consistent      with liver sarcoid and portal fibrosis.  Ultrasound on November      2009 cholelithiasis.   DISCHARGE MEDICATIONS:  1. Prednisone 40 mg p.o. daily.  2. Albuterol 90 mcg 2 puffs q.i.d. p.r.n.  3. Prednisone 40 mg p.o. daily.  4. Calcium 500 mg 200.  5. Vitamin D 1 tablet p.o. b.i.d.  6. Omeprazole 20 mg 1 tablet p.o. daily.   DISPOSITION AND FOLLOWUP:  The patient will follow up in the Outpatient  Clinic in 1-2 weeks to see how her symptoms are doing.  She needs a  followup arranged with  [facility], Dr. Deatra Ina as soon as  possible given her abnormal liver function test and follow up on liver  sarcoidosis.  Ultimately, I think she is going to need a referral to a  [facility] given how unusual liver sarcoidosis is.  She may  need to be evaluated for liver transplant, especially given the  transformation seen in her CT scan this admission.  We will increase her  steroids at this time.  She also will need followup with Dr. Melvyn Novas  with  [facility] to check on the status of her pulmonary sarcoidosis.   BRIEF ADMITTING HISTORY AND PHYSICAL:  Ms. Coby is a 40 year old woman  with liver biopsy-proven sarcoidosis who presents to the ED complaining  of increased abdominal pain and fever.  She did note she had been out of  her prednisone for 4 days.  She usually takes 15 mg daily.  This was  recently decreased from 20 in the past.  She has generalized fatigue,  chronic nonproductive cough.  No dysuria.  She has no active rashes or  swollen lymph nodes.  She has complained of fevers for 1 week.   PHYSICAL EXAMINATION:  VITAL SIGNS:  On admission, temperature 102.1,  heart rate 115, blood pressure 102/70, respirations 16.   ADMISSION LABORATORIES:  Ammonia 38, lipase 62.  Sodium 132, potassium  4.3, chloride 99, bicarb 24, glucose 77, BUN 10, creatinine 0.6.  Bilirubin 1.8, alk phos  430, AST 77, ALT 38, total protein 7.8, albumin  2.1, calcium 9.2.  UA negative.  Hemoglobin 11.7, hematocrit 34.3,  platelets 231.   IMAGING AND TEST/PROCEDURES:  1. CT of the abdomen and pelvis with contrast.  CT of the abdomen      findings:  Chronic bronchiectasis noted in the lung bases.  Lymph      nodes at the esophageal hiatus have increased in size, stable      hepatosplenomegaly, free fluid along the right edge of the liver      which is new, stable increase in lymph nodes multiple stations from      the right hilum to the esophageal hiatus at the porta hepatis and      para-aortic stations.  This may be associated with sarcoidosis.      Radiology also states that lymphoma should also be considered.  Of      particular note, she had a lymph node biopsied in 2007 which was      consistent with sarcoidosis.  2. Chest x-ray, 2 views, no acute cardiopulmonary disease, stable      scarring in the bilateral lung bases, postoperative changes in the      left axilla.   HOSPITAL COURSE:  1. Abdominal pain.  After reviewing the CT  scan findings, there is no      clear underlying cause and a large differential for her abdominal      pain.  Currently, we believe that this is a sarcoidosis flare.  She      has mild distention of her abdomen and chronic baseline      hepatosplenomegaly.  We will at this point increase her steroids to      40 mg p.o. daily since she does seem to improve symptomatically and      her liver function test improved when she is on higher doses of      steroids.  She will need GI evaluation as soon as possible as an      outpatient.  She still may need an EGD to rule out another      underlying cause of her epigastric pain, such as gastritis or      peptic ulcer disease.  She also may need to be evaluated for      gallbladder disease, as she has a known history of gallstones.  2. Sarcoidosis:  As above, we will increase her steroids to 40 mg.      She needs a referral to the Liver Clinic or [place] for      treatment of her liver sarcoid and possible liver transplantation      should she continue with the transformation to cirrhosis.  3. GERD.  We will continue on her PPI daily.  4. Unspecified anemia, is stable at 11.7.      Acquanetta Chain, D.O.  Electronically Signed      Felicity Pellegrini, MD  Electronically Signed    ELG/MEDQ  D:  08/09/2008  T:  08/10/2008  Job:  [id number]   cc:   Junius Finner, MD  Sandy Salaam. Deatra Ina, MD,FACG

## 2010-09-13 NOTE — Assessment & Plan Note (Signed)
 HEALTHCARE                             PULMONARY OFFICE NOTE   NAME:Melody Gray, Melody Gray                       MRN:          [id number]  DATE:12/19/2006                            DOB:          18-Aug-1970    HISTORY:  A 40 year old black female with documented sarcoid in 2007  with relapse of symptoms of arthritis, and rash, and subjective lymph  node swelling in April of 2008, who has been maintained on prednisone  since May of this year. She states that on prednisone she is still on 20  mg a day but is almost completely better. When she began to feel better  she switched to every other day dosing instead of following the tapering  schedule that I had previously given her. She has had no flare up of  either dyspnea, cough, or arthritis, and continues to only be bothered  by mild subjective lymph node swelling under her right axilla.   PHYSICAL EXAMINATION:  She is an obese, ambulatory black female in no  acute distress. Her blood pressure is 140/96 after not taking her blood  pressure medicines this morning.  HEENT: Unremarkable. Pharynx clear.  LUNG FIELDS: Completely clear bilaterally to auscultation and  percussion.  There is a regular rate and rhythm without murmur, gallop, or rub.  ABDOMEN: Soft and benign.  EXTREMITIES: Warm without calf tenderness cyanosis, clubbing, or edema.   IMPRESSION:  1. No evidence of active sarcoid. I have recommended that she taper      the prednisone down to a low floor of 10 mg tablets dose one half      on alternate days and given her very specific instructions on how      to do this in 7 day increments. However, reducing the dose from 20      mg daily to 20 mg every other day is not a normal way to taper it      with the goal of minimizing symptoms, and also minimizing adrenal      suppression with prednisone.  2. Hypertension, not well controlled. I have recommended changing      Benicar to 20/12.5 continue 1  daily with follow up here in 4 weeks.     Christena Deem. Melvyn Novas, MD, [facility]  Electronically Signed    MBW/MedQ  DD: 12/19/2006  DT: 12/20/2006  Job #: [id number]   cc:   [facility] Teaching Service

## 2010-09-16 ENCOUNTER — Encounter: Payer: Self-pay | Admitting: Internal Medicine

## 2010-09-16 ENCOUNTER — Telehealth: Payer: Self-pay | Admitting: Internal Medicine

## 2010-09-16 NOTE — Discharge Summary (Signed)
[place]  Patient:    Melody Gray, Melody Gray                       MRN: [id number] Adm. Date:  [id number] Disc. Date: 07/27/99 Attending:  Lahoma Crocker Dictator:   Elvera Bicker, M.D.                           Discharge Summary  DATE OF BIRTH:                Apr 11, 1971.  ADMISSION DIAGNOSES:          1. Preeclampsia.                               2. Intrauterine pregnancy at 37 weeks.                               3. Hypoxia.  DISCHARGE DIAGNOSIS:          Postpartum, status post delivery.  Please see LTCS.  CONSULTS:                     Critical care, Dr. Gwenette Greet.  PROCEDURES:                   1. LTCS on July 22, 1999.                               2. Central CVP placement on July 21, 1999.                               3. Echocardiogram of the heart on July 26, 1999.  ADMISSION HISTORY AND PHYSICAL: Melody Gray is a 40 year old G2, P1-0-0-1 who presented at 37 weeks with complaints of contractions, shortness of breath, and chest pain.  Her chest pain was described as occurring once a day since the previous week, was dull, aching, nonradiating. he said that her chest pain was associated with shortness of breath and that she had had periods of shortness of breath throughout her pregnancy.  She had a blood pressure of 158/73 and DIH panel was drawn.  A cath UA was performed and ABG was ordered and she was admitted to the AICU.  Please refer to the admit note for more complete history and physical.  HOSPITAL COURSE: #1 - SHORTNESS OF BREATH:     Her initial ABG revealed a PO2 of 65, PCO2 of 33, and pH of 7.472.  She was put on a face mask DD:  07/27/99 TD:  07/27/99 Job: [id number]

## 2010-09-16 NOTE — Assessment & Plan Note (Signed)
[place] [facility]                             PULMONARY OFFICE NOTE   NAME:VOIDMessina, Marren                       MRN:          [id number]  DATE:09/05/2006                            DOB:          08-02-1970    PULMONARY/EXTENDED FOLLOWUP OFFICE VISIT   HISTORY:  This is a 40 year old black female, active smoker,  with  suspected sarcoid seen on July 07, 2005, associated with a cough that  did not improve on prednisone and therefore was felt to be upper  airway/reflux related. She was lost to followup a year ago and had  undergone a lymph node biopsy in January of 2007, indicating that she  might have sarcoid, but did not return for followup after the lymph node  biopsy and was maintained off of prednisone apparently until for maybe  a year until she was admitted recently to [facility] with dyspnea  and cough and placed on prednisone at 6 mg per day, which she says has  helped some. She comes back mainly complaining of muscle cramps in her  feet and hands.   Presently she is maintained on:  1. 40 mg of prednisone a day.  2. Lisinopril 10.  3. Metoprolol 25 mg b.i.d.  4. Amlodipine 20 mg b.i.d.  5. Metformin 500 mg daily.   PHYSICAL EXAMINATION:  She is an obese, ambulatory black female in no  acute distress. She is 235 pounds, which is up from 194 when she was  evaluated here on July 07, 2005.  HEENT: Is unremarkable. Oropharynx is clear.  LUNGS: Lung fields are completely clear bilaterally to auscultation and  percussion.  HEART: Regular rate and rhythm without murmur, gallop or rub.  ABDOMEN: Soft, benign.  EXTREMITIES: Warm without calf tenderness, cyanosis, clubbing or edema.   I reviewed a left heart cath showing normal coronary arteries on  June 06, 2006, with a left ventricular and diastolic pressure  documented at 16 and low-normal ejection fraction. Her most recent chest  x-ray documented on May 31, 2006 indicated bilateral  infiltrates  with slight improvement in right upper lobe aeration.  A CT scan done  on January 30th, showed bulky mediastinal hilar nodes with interval  progression of pulmonary fibrosis and new areas of patchy ground glass  opacity.   IMPRESSION:  The most impressive aspect of this patient is that she has  been gaining significant weight since on prednisone and the prednisone  was started for what was considered to be a sarcoidosis flare. Her  main symptom now is cramping, muscle cramping, which is probably from  taking high doses of prednisone.   Parenthetically, it is not usually necessary to treat sarcoidosis, even  during acute exacerbations, with any more than 20 mg per day and I have  asked her to actually begin to taper this off over the next several  weeks.   Since many of her symptoms at this point may be directly related to ACE  inhibitors rather than due to sarcoid, I have recommended that she  switch over to Benicar 20 mg one daily and observe and  a GERD diet as  well as continue the famotidine at the previous level.   Next, I would want much more objective evidence that sarcoid was active  before I would continue her longterm on prednisone because morbid  obesity with secondary reflux is probably drive many of her respiratory  symptoms to much greater extent than is inflammation related to sarcoid  and since prednisone can make obesity and reflux worse, it actually may  end up causing more problems than it is helping.   Also, note that the natural history of sarcoid is towards spontaneous  resolution within three years anyways so I would strongly recommend if  she receives longterm prednisone it be through a sarcoid specialist  having weighed the risks, benefits and alternatives in this difficult  setting. With that in mind, I have asked her to keep and see [name] in four  weeks for followup and a chest x-ray will be obtained at that time to  compare to previous studies  available on the [facility] system and at that  time hopefully off the systemic steroids, recheck an ACE level and sed  rate to see what extend we can get more objective evidence regarding the  systemic nature of her sarcoid.     Christena Deem. Melvyn Novas, MD, [facility]  Electronically Signed    MBW/MedQ  DD: 09/05/2006  DT: 09/05/2006  Job #: [id number]   cc:   [facility] Teaching Service

## 2010-09-16 NOTE — Consult Note (Signed)
NAME:  Melody Gray, CISNEROZ                          ACCOUNT NO.:  [id number]   MEDICAL RECORD NO.:  [id number]                   PATIENT TYPE:  INP   LOCATION:  5504                                 FACILITY:  [place]   PHYSICIAN:  Sammuel Hines. Daiva Nakayama, M.D.              DATE OF BIRTH:  07/18/1970   DATE OF CONSULTATION:  10/17/2003  DATE OF DISCHARGE:                                   CONSULTATION   REFERRING PHYSICIAN:  Dr. Renato Shin.   REASON FOR CONSULTATION:  [name] is a 40 year old black female who  presented a few days ago with about a week's worth of right breast pain.  She has had redness and swelling of the right breast.  She was admitted and  started on antibiotic therapy and her pain has continued to worsen.  She has  run fevers of 102.  Her white count has been around 17,000 and has not  improved.  Yesterday, her breast started to drain purulent material and  surgery was called today to evaluate.  She otherwise denies nausea,  vomiting, chest pain, shortness of breath, diarrhea and dysuria.  The rest  of her review of systems is unremarkable.   PAST MEDICAL HISTORY:  Her past medical history is significant for:  1. Diabetes.  2. Hypertension.  3. Obesity.   PAST SURGICAL HISTORY:  None.   MEDICATIONS:  None.   ALLERGIES:  No known drug allergies.   SOCIAL HISTORY:  She smokes about a 2 packs of cigarettes a week and denies  any alcohol use.   FAMILY HISTORY:  Her family history is significant for colon cancer in her  mother, diabetes and hypertension in her father.   PHYSICAL EXAMINATION:  GENERAL:  On physical exam, in general, she is an  obese black female in no acute distress.  SKIN:  Her skin is warm and dry with no jaundice.  EYES:  Her extraocular muscles are intact.  Pupils are equal, round and  reactive to light.  Sclerae nonicteric.  LUNGS:  Lungs are clear bilaterally with no use of accessory respiratory  muscles.  HEART:  Heart has a regular rate  and rhythm with an impulse in the left  chest.  ABDOMEN:  Abdomen is soft and nontender with no palpable mass or  hepatosplenomegaly.  EXTREMITIES:  No cyanosis, clubbing or edema.  PSYCHOLOGICAL:  She is alert and oriented x3 with no evidence of any anxiety  or depression.  BREASTS:  On examination of her breasts, on her right breast she has a very  tender, red, swollen right breast with skin breakdown on the underside and  draining purulent foul-smelling material from the underside.   LABORATORY DATA:  Her white count is 17,000.   ASSESSMENT AND PLAN:  This is a 40 year old black female with a right breast  abscess that will require I&D or debridement in the operating room; this  area is too large to do at the bedside.  The area will need to be left open  and packed and dressing changes done every day.  I have explained to her in  detail the risks and benefits of the operation as well as some of the  technical aspects and she understands and wishes to proceed.  The patient  did eat breakfast this morning, so we will need to wait a few hours and plan  to do this in the early afternoon.                                               Sammuel Hines. Daiva Nakayama, M.D.    PST/MEDQ  D:  10/17/2003  T:  10/19/2003  Job:  [id number]

## 2010-09-16 NOTE — Discharge Summary (Signed)
NAMEJENSINE, WATERHOUSE NO.:  [id number]   MEDICAL RECORD NO.:  [id number]          PATIENT TYPE:  INP   LOCATION:  3703                         FACILITY:  [place]   PHYSICIAN:  Rico Sheehan, D.O.  DATE OF BIRTH:  1971/01/11   DATE OF ADMISSION:  05/30/2006  DATE OF DISCHARGE:                               DISCHARGE SUMMARY   DISCHARGE DIAGNOSES:  1. Shortness of breath, likely secondary to sarcoidosis flare.  2. QT prolongation.  3. Hypertension.  4. Borderline diabetes mellitus type 2.   DISCHARGE MEDICATIONS:  Include:  1. Metformin 500 mg p.o. daily.  2. Lisinopril 10 mg 1 tablet p.o. daily.  3. Lopressor 25 mg 1 tablet in the morning and 1 tablet in the      evening.  4. Prednisone 60 mg 1 tablet p.o. daily.  5. Famotidine 20 mg, take 1 tab in the morning and in the evening.   The patient is to follow up with Dr. Rico Sheehan at the outpatient  clinic and Dr. Jiles Crocker will consult a rheumatologist for further workup  and treatment of sarcoidosis.   A chest x-ray was revealed on January 30 which revealed bilateral  infiltrates and cardiac enlargement and probable hilar adenopathy.   A CT angiogram also on May 30, 2006 revealed:  1.  No pulmonary  embolus.  2.  Bulky mediastinal, hilar, peri, precardiac, axillary,  retropleural, and upper abdominal lymphadenopathy which has not  significantly changed from April 15, 2005.  3.  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  4.  Cardiomegaly.  5.  Hepatomegaly.   An upper quadrant ultrasound was performed on June 01, 2006.  Impression:  1.  Prominent upper abdominal retroperitoneal adenopathy  with limits of evaluation of the pancreas.  2.  Hepatomegaly spanning of  17.6 cm.  3.  Gallstones and gallbladder sludge with mild gallbladder  wall thickening measuring up to 3.4 mm,  slight prominent of the common  bile duct measuring up to 8 mm, distal aspect of the common bile duct  not visualized well enough to rule out a distal common bile duct stone.   A cardiac cath was performed on June 06, 2006, and preliminary  reveals no significant coronary artery stenosis.  The cardiac cath also  revealed normal coronary arteries and normal aortoiliac system without  evidence for renal artery stenosis and low-normal left ventricular  function.   A cardiac consult was obtained during this admission by Mr. Shelva Majestic.   HOSPITAL COURSE:  1. The patient was initially admitted on May 30, 2006.  In      summary, she is a 40 year old African-American female with a past      medical history significant for sarcoidosis who presented with      severe shortness of breath and hypoxia of approximately 1 week's      duration.  She presented to the ED after waking up extremely short      of breath and diaphoretic, at which time  she went to the ED and her      oxygen sats on room air were 85%.  She described a cough that had      persistent which is minimally productive, and the symptoms had not      been amenable to over-the-counter treatment.  Since being admitted,      when her shortness of breath had significantly improved, she      underwent a course of steroids beginning with 80 mg of IV Solu-      Medrol t.i.d. and was subsequently weaned to 60 mg of p.o.      prednisone.  She was started on Avelox on the time of admission and      was switched to doxycycline 3 days into the admission for treatment      of a presumed pneumonia.  At the time of discharge, her shortness      of breath is completely resolved and her cough is minimal.  At the      time of discharge, she was able to ambulate the distance of      hospital floor without dropping her saturations below 94%.  2. Prolonged QT intervals.  Upon arrival to the ED, an EKG was      performed for her shortness of  breath which showed a prolonged QT.      The patient had been started on Avelox, and this drug was      discontinued as its known cause of QT prolonged; however, with the      cessation of this drug, her QT prolongation continued and a cardiac      consult was obtained.  They recommended drawing cardiac enzymes,      which were negative, as well as starting metoprolol, which we did.      They were concerned that the QT prolongation could be caused by      ischemia, so she underwent a cardiac catheterization which did not      reveal any areas of stenosis or concern.  The patient will be      discharged with instructions to avoid any medications that prolong      QT intervals and to maintain normal electrolytes.  3. Hypertension.  At the time of admission, the patient's blood      pressure was 174/112.  Over the subsequent days, this blood      pressure proved difficult to treat.  She was restarted on her home      dose of HCTZ; however, with that dose, her potassium dropped      significantly, so the HCTZ was discontinued.  She was started on      Lopressor 25 mg b.i.d. as well as lisinopril 10 mg daily.  Her      hypokalemia also resolved, and at the time of discharge, her      potassium was 4.5.  4. Borderline diabetes.  At the time of admission, the patient's Alc      was 6.2, indicating very well controlled diabetes; however, in the      setting of steroid use and significant hyperglycemia while in-      house, the patient will be started on metformin daily in the hopes      that this will counteract the hyperglycemic effects of the      steroids.   In summary, at the time of discharge, the patient's shortness of breath  had completely resolved as well as her  cough.  She had experienced no  chest pain during the course of her evaluation and her cardiac  catheterization was normal.  At the time of discharge, her white blood cell count was 9.2, her H&H was 13.5 and 39.5 with platelets  of 275.  Her sodium was 134, potassium of 4.5, chloride 102, bicarb 26, BUN 18,  creatinine of 0.6, calcium of 9.2, and a glucose of 125.     ______________________________  Theodore Demark    ______________________________  Rico Sheehan, D.O.    CS/MEDQ  D:  06/06/2006  T:  06/07/2006  Job:  [id number]

## 2010-09-16 NOTE — Telephone Encounter (Signed)
Per MW ONO results show that she needs to stay on o2 at hs 2lpm and come in for rov to discuss further recs. Spoke with pt and notified her of this and she verbalized understanding. She sched rov with MW for 10/05/10 at 10:45 am.

## 2010-09-16 NOTE — Op Note (Signed)
NAME:  Melody Gray, Melody Gray                          ACCOUNT NO.:  [id number]   MEDICAL RECORD NO.:  [id number]                   PATIENT TYPE:  INP   LOCATION:  6706                                 FACILITY:  [facility]   PHYSICIAN:  Sammuel Hines. Daiva Nakayama, M.D.              DATE OF BIRTH:  1970-05-10   DATE OF PROCEDURE:  10/17/2003  DATE OF DISCHARGE:                                 OPERATIVE REPORT   PREOPERATIVE DIAGNOSIS:  Right breast abscess.   POSTOPERATIVE DIAGNOSIS:  Right breast abscess.   PROCEDURE:  I&D of right breast.   SURGEON:  Dr. Marlou Starks   ANESTHESIA:  General endotracheal.   DESCRIPTION OF PROCEDURE:  After informed consent was obtained, the patient  was brought to the operating room and placed in the supine position on the  operating room table.  After adequate induction of general anesthesia, the  patient's right breast was prepped with Betadine and draped in the usual  sterile manner.  The inferior portion of the breast was draining foul-  smelling, purulent fluid.  The skin in this area just beneath the nipple  areolar complex was black and necrotic.  This black, necrotic skin was  excised sharply with a 15 blade knife.  The underlying abscess cavity was  able to be evacuated and suctioned out.  A finger was inserted into the  abscess cavity to break up any loculations.  All of the tissue that appeared  to be necrotic was debrided sharply with the electrocautery.  Once this was  accomplished, the wound was irrigated with copious amounts of saline.  Several small bleeding points were coagulated with the electrocautery until  the area was fairly hemostatic.  The wound was then packed with Kerlix, and  sterile dressings were applied.  The patient tolerated the procedure well.  At the end of the case, all needle, sponge, and instrument counts were  correct.  The patient was then awakened and taken to the recovery room in  stable condition.               Sammuel Hines. Daiva Nakayama, M.D.    PST/MEDQ  D:  10/21/2003  T:  10/21/2003  Job:  [id number]

## 2010-09-16 NOTE — Cardiovascular Report (Signed)
[name], Gray NO.:  [id number]   MEDICAL RECORD NO.:  [id number]          PATIENT TYPE:  INP   LOCATION:  3703                         FACILITY:  [place]   PHYSICIAN:  Melody Gray, M.D.     DATE OF BIRTH:  [birth year]   DATE OF PROCEDURE:  06/06/2006  DATE OF DISCHARGE:                            CARDIAC CATHETERIZATION   INDICATIONS:  [name] is a 40 year old African American female  with a history of obesity, hypertension, glucose intolerance as well as  family history for coronary artery disease in addition to tobacco use.  She had noted a decline in exercise tolerance as well as some vague  episodes of chest fullness.  A recent ECG suggested transient QT  prolongation and also raised the concern of possible ischemia as a cause  for transient QTC prolongation.  She had T-waves inferiorly and  anteriorly.  Definitive diagnostic cardiac catheterization was  recommended.   PROCEDURE:  After premedication with Versed 2 mg intravenously, the  patient was prepped and draped in usual fashion.  Her right femoral  artery was punctured anteriorly and a 5-French sheath was inserted.  Diagnostic catheterization was done utilizing 5-French Judkins 4 left  and right coronary catheters.  A 5-French pigtail catheter was used for  biplane cine left ventriculography.  Distal aortography was also done  due to the patient's risk factors and hypertension to make certain there  was no renovascular etiology.  She tolerated the procedure well and  returned to her room in satisfactory condition.   HEMODYNAMIC DATA:  Central aortic pressure was 130/82.  Left ventricular  pressure is 130/16, post A-wave 19.   ANGIOGRAPHIC DATA:  Left main coronary artery was angiographically  normal and bifurcated into an LAD and left circumflex system.   The LAD was a large angiographically normal vessel that gave rise to two  large diagonal vessels and several septal perforating  arteries.   The circumflex vessel was angiographically normal.  It gave rise to one  major marginal vessel.   The right coronary was an angiographically normal dominant vessel, gave  rise to a PDA and a posterolateral system.   Biplane cine left ventriculography revealed low normal LV function with  an ejection fraction of 50-55% without definitive wall motion  abnormalities.  Contractility appeared normal on the LAO projection.   Distal aortography revealed a relatively normal aortoiliac system.  There was no evidence for renal artery stenosis.   IMPRESSION:  1. Low normal left ventricular function.  2. Normal coronary arteries.  3. Normal aortoiliac system without evidence for renal artery      stenosis.           ______________________________  Melody Gray, M.D.     TK/MEDQ  D:  06/06/2006  T:  06/06/2006  Job:  [id number]   cc:   Melody Chris, MD  Cardiac Cath Lab

## 2010-09-16 NOTE — Discharge Summary (Signed)
[place]  Patient:    Melody Gray, Melody Gray                       MRN: [id number] Adm. Date:  [id number] Disc. Date: 07/27/99 Attending:  Lahoma Crocker Dictator:   Tsosie Billing, M.D.                           Discharge Summary  DATE OF BIRTH:                23-Feb-1971  ADMISSION DIAGNOSES:          1. Intrauterine pregnancy at 37 weeks.                               2. Preeclampsia.                               3. Hypoxia.  DISCHARGE DIAGNOSES:          Postpartum status post LTCS.  ATTENDING:                    Agnes Lawrence, M.D.  RESIDENT:                     Elvera Bicker, M.D.  CONSULTS:                     Critical care, Dr. Jamal Collin.  PROCEDURES:                   1. CBP placement on July 21, 1999.                               2. Low transverse cesarean section on July 22, 1999.                               3. 2-D echocardiogram on July 26, 1999.  HISTORY AND PHYSICAL:         Melody Gray is a 40 year old G2, P1-0-0-1 who presented at 37 weeks with a complaint of contractions, shortness of breath, and chest pain.  She reports the chest pain had been occurring once a day for the last week and was nonradiating and dull.  She said it was associated with episodes of shortness of breath.  She said she would have shortness of breath during her pregnancy and was presently suffering from shortness of breath on admission. She was admitted with a PIH panel, catheter UA, ABG, and a chest x-ray.  Please refer to the admit note for more complete history and physical.  HOSPITAL COURSE:              # 1 - Shortness of breath.  She is admitted to the                               AICU.  Her initial ABG showed O2 of 65, a PCO2 of 33,                               and  a pH of 7.472.  She was placed on O2 mask                               requiring between 7-11 L O2 on day of admission.  Chest x-ray revealed pulmonary edema, but no evidence                               of a pneumonia.  CBP was placed.  She was given Lasix                               and scheduled for induction of labor as pulmonary                               edema was thought to be secondary to [facility] and delivery                               was thought to be the best treatment.  She was                               treated with repeated small doses approximately 10 mg                               of Lasix IV to achieve diuresis and lower her CBPs.                               Critical care was consulted on July 22, 1999. Their                               impression was an acute respiratory failure probably                               secondary to either pneumonia, atelectasis, or                               plugging as well as some hypercarbia secondary to                               obesity, pain meds, and hypoaeration.  They suggested                               a change in Unasyn to Zosyn.  Unasyn was started or                               a presumed pneumonia.  Recommended albuterol nebs,                               chest PT and symptoms spirometry and repeat  chest                               x-ray.                                Repeat chest x-ray revealed improvement.  It did how                               a para-aortic density in the right lower lobe,                               questionable infiltrate.  There was an improvement in                               her edema.  She was maintained on antibiotics for                               questionable pneumonia and followed for clinical                               improvement.  She did improve following delivery nd                               had pretty significant diuresis.  She was saturating                               94% on 1 L by March 25 and was on room air by July 25, 1999.  She was saturating 96-98% on room air                               prior to discharge.  Her antibiotics were                               discontinued on July 25, 1999 as there was a low                               likelihood for pneumonia at that time.                                # 2 - Intrauterine pregnancy.  Given that her                               pulmonary edema was likely secondary to a PIH, she  was started on magnesium for seizure prophylaxis nd                               improvement in her pulmonary status.  Her membranes                               were ruptured and IUP was placed on July 22, 1999.                               She was taken to the OR on July 22, 1999 at 1830 for                               prolonged latent labor and LTCS was performed as ell                               as a bilateral tubal ligation.  She recovered well                               with minimal pain following the procedure.                                # 3 - Preeclampsia.  Melody Gray laboratories were                               normal except for an elevated uric acid throughout                               the course of her hospital stay. Her uric acid was                               5.4 on admission and was 6.1 on July 24, 1999. he                               had blood pressures ranging from 105-150/60-80 prior                               to discharge.  Patients magnesium was discontinued                               by July 24, 1999.                                # 4 - Postpartum hemorrhage.  Patient did have a                               significant bleed likely secondary to ______  following LTCS.  She was given 3 units PRBCs. Her                               hemoglobin dropped from 9.5 to 6.9.  After                               replacement it went to 8.0.  She  was discharged with                               a hemoglobin at 7.2.  She was asymptomatic.  Her                               vital signs were stable.  She had no problems with                               ambulation.  DISPOSITION:                  Discharged to home.   MEDICATIONS:                  1. Ibuprofen 600 mg p.o. q.i.d. p.r.n.                               2. Iron.  Continue as prior to delivery.                               3. Prenatal vitamins.  Continue as prior to delivery.  FOLLOW-UP:                    At the MAU on Friday, March 30 for staple removal. Follow up at [facility] for a postpartum check. DD:  07/27/99 TD:  07/27/99 Job: [id number]

## 2010-09-16 NOTE — H&P (Signed)
. [facility]  Patient:    Melody Gray, Melody Gray                       MRN: [id number] Adm. Date:  [id number] Attending:  Henrine Screws CC:         Doyce Para, M.D.                         History and Physical  DATE OF BIRTH:  05-23-1970  CHIEF COMPLAINT:  Shortness of breath, hypoxia and chills.  HISTORY OF PRESENT ILLNESS:  Melody Gray is a very pleasant 40 year old female with a recent admission to [facility] for eclampsia.  She had congestive heart failure, hypertension and proteinuria requiring an emergency C-section on July 22, 1999, and she and her baby son did very well postoperatively.  She did suffer some bronchospasm while hospitalized which was treated with albuterol successfully. She also had some uterine bleeding requiring transfusion of three units of packed red cells with a discharge hemoglobin of 7.2 but no evidence of active bleeding. At the time of her eclampsia, she was seen in conjunction with critical care and she was treated for a questionable right lower lobe pneumonia as well.  She received four to five days of intravenous Unasyn and then switching to Zosyn.  The last several days of her admission were uncomplicated with blood pressures ranging from [id number] to [id number] systolic and diastolics were 60 to 80.  She was discharged on iron therapy and prenatal vitamins, feeling well on July 27, 1999.  This was five days after admission.  In the past week, her first three to four days at home were fine with no shortness of breath and feeling well.  In the last two to three days she developed dyspnea on exertion and had very frank severe orthopnea, staring last  night.  She could not lie down at all to sleep.  She also noticed chills and a productive cough with "thin, brown phlegm," and she did take her temperature once and it was 99.9 and this is when she did not feel hot.  With severe shortness of breath, she  presented to [facility] earlier today and was hypertensive with a blood pressure of 175/101, pulse is 125 and regular, respiratory rate was 30 and labored.  He pulse oximetry on room air was 61% to 70% and her initial arterial  blood gases revealed a pH of 7.37, low pCO2 at 31 and severely low pO2 at 36. n 100% nonrebreather her pulse oximetry came up to 89% to 90%.  She was given 40 g of IV Lasix and considered to be in congestive heart failure and transferred to  [facility] for further evaluation.  Of note, she did have a 2-D echocardiogram on July 25, 1999, three days after he delivered her child, which apparently had normal LV functions, but I do not have that report.  She currently denies leg pain, chest pain or abdominal pain.  She  simply is short of breath and has had chills and the productive cough.  She is admitted now for further work-up.  MEDICATIONS:  Include prenatal vitamins which she has been taking, and iron tablets which she has not been taking.  PAST MEDICAL HISTORY:  Significant for obesity, anemia, and a question of a recent pneumonia, and recent eclampsia with apparent resolution one week ago.  PAST SURGICAL HISTORY:  C-section on July 22, 1999, with concomitant bilateral  tubal ligation.  FAMILY HISTORY:  Father died at age 69 with multiple sclerosis.  Her mother and two sisters have no health problems.  SOCIAL HISTORY:  The patient smoked for three to four years but stopped three months ago.  She does not drink alcohol.  She has never used IV drugs.  She does not use other drugs as well.  She lives with her mother and is single, and has  34-year-old daughter who is healthy.  Her baby boy delivered on July 22, 1999, nd is healthy as well and is feeding with formula.  REVIEW OF SYSTEMS:  Question of seasonal allergies--the patient states she might be allergic "to pollen."  She has no history of heart, lung, hepatic,  renal, neurological, or blood disease that she is aware of.  She does have some mild residual vaginal spotting since her delivery.  PHYSICAL EXAMINATION:  GENERAL:  Obese female, sitting upright on face mask O2.  She appears comfortable currently.  VITAL SIGNS:  Her blood pressure is 149/99, pulse is 100 and regular, respiratory rate is 22, temperature is 99.4 and this is after Tylenol.  HEENT:  Atraumatic, normocephalic, no AV nicking noted on fundi.  NECK:  Supple.  No thyromegaly.  CHEST:  Has diffuse expiratory wheezes noted in the right upper lobe posteriorly. No rales in the bases currently.  CARDIAC:  Examination reveals regular rate and rhythm with no gallop, no murmur.  ABDOMEN:  Soft, nontender, normal bowel sounds.  She is obese.  C-section scar slightly opened on the right lateral suture line, though superficial and nonerythematous and nonpurulent.  EXTREMITIES:  With 3+ edema up to the knees--she has had this for several months and this is actually better since she has delivered her child.  This is by her history.  She has no Homans bilaterally and no palpable calf pain and no palpable cords bilaterally.  Capillary refill is excellent distally.  NEUROLOGICAL:  Examination reveals her to be oriented x 3.  This is a nonfocal examination.  Good strength throughout.  Chest x-ray reveals marked airspace disease, right greater than left.  It does appear that she may be in congestive heart failure and there may be some hepatomegaly.  This is a portable examination.  ECG reveals sinus tachycardia with a P wave prominently ______ with no ST segment elevation or depression.  Her ______ looks borderline low but basically normal.  Her hemoglobin is 11, white blood cell count elevated at 16,400 and platelet count 440,000.  Other i-STAT results include a sodium of 141, potassium 3.9, chloride  107, bicarb 25, BUN 10, creatinine 7, chloride 97.  Arterial blood gases  at [facility] as above--room air--ph 7.37, pCO2 31, p02 36.  Chest CT is pending to rule out pulmonary embolus as well as other laboratories as mentioned in the plan below.   ASSESSMENT:  A 40 year old female with marked gradient with severe hypoxia with  probable mixed pulmonary processes causing respiratory failure.  She likely has  congestive heart failure and pneumonia.  I am also concerned about a pulmonary embolus.  I doubt she has postpartum cardiomyopathy with a elevated blood pressure with respect to increased pleural effusions.  The patient have brown phlegm which is watery and chills.  Suspect some element of pulmonary infection.  PLAN:  1. Check chest CT to rule out obvious PE.  If there is no PE, will start     DVT, PE prophylaxis until clinically better--Lovenox  30 mg subcu q.12h.  2. Repeat 2-D echocardiogram to assure normal LV function.  Again, I doubt she  has postpartum cardiomyopathy secondary to such elevated blood pressures.  3. Chills, leukocytosis and right greater than left airspace disease on chest     x-ray--likely an element of pneumonia.  Not sure how much.  Will culture     sputum.  Could not culture blood test too because of the way her IVs are     placed.  We will go ahead and start Levaquin 500 mg q.24h.  4. Elevated blood pressure--this has come down relatively well.  We will continue     IV nitroglycerin tonight.  Consider Norvasc 5 mg p.o. q.d.  Question first ose     this p.m. if systolic blood pressures are higher than [id number] or diastolic blood     pressures consistently higher than 100.  Can also consider Catapres patch but I     would like to avoid beta blockers because of mild bronchospasm.  5. Recent proteinuria--check urinalysis and send culture.  6. Lung spasm--albuterol nebs t.i.d. for now.  She does have a history of smoking     and perhaps a history of allergic to pollens.  7. Send LFT and UA.  Check CPK.  8. Repeat CBC,  CMET in a.m.  No further diuresis currently.  9. Check ABG in a.m. p.r.n. 10. Seen by critical care at [facility] and consider reconsult them if she does     not improve quickly.  She will be admitted to 3307--the step-down unit. DD:  08/05/99 TD:  08/05/99 Job: [id number]

## 2010-09-16 NOTE — Op Note (Signed)
Melody Gray, Melody Gray                ACCOUNT NO.:  [id number]   MEDICAL RECORD NO.:  [id number]          PATIENT TYPE:  AMB   LOCATION:  DAY                          FACILITY:  [facility]   PHYSICIAN:  Imogene Burn. Georgette Dover, M.D. DATE OF BIRTH:  10-08-1970   DATE OF PROCEDURE:  05/02/2005  DATE OF DISCHARGE:                                 OPERATIVE REPORT   PREOPERATIVE DIAGNOSIS:  Generalized lymphadenopathy.   POSTOPERATIVE DIAGNOSIS:  Generalized lymphadenopathy.   PROCEDURE PERFORMED:  Left axillary lymph node biopsy.   SURGEON:  Imogene Burn. Georgette Dover, M.D.   ASSISTANT:  None.   ANESTHESIA:  General endotracheal anesthesia.   INDICATIONS FOR PROCEDURE:  The patient is a 40 year old [place]  female who presented with palpable lymphadenopathy which has developed over  the last couple of months.  She underwent a CT scan of the chest, abdomen  and pelvis on April 14, 2005, which showed marked thoracic adenopathy.  She also had marked upper abdominal lymphadenopathy with the largest node  measuring 4.2 cm.  Pelvis CT also showed some pelvic lymphadenopathy.  She  presented for a lymph node biopsy to aid in diagnosis.  She had a fairly  large palpable node in her left axilla.   DESCRIPTION OF PROCEDURE:  Patient was brought to the operating room and  placed in the supine position on the operating room table.  After an  adequate level of general endotracheal anesthesia was obtained, the  patient's left axilla was prepped with Betadine and draped in sterile  fashion.  I was able to easily palpate the node.  This area was infiltrated  with 10 mL of 0.25% Marcaine with epinephrine.  A transverse incision was  made over this site.  Dissection was carried down through the subcutaneous  fat and down to the lymph node.  The lymph node was bluntly dissected free  from the surrounding tissue.  It was quite large in size.  Several small  lymphatics and blood vessels were ligated with clips,  then divided.  A stay  suture of 2-0 silk was used to provide some traction on the lymph node.  The  lymph node was freed up from the surrounding tissue and removed and sent for  pathology examination as a fresh specimen.  Grossly, my estimation is that  this node was approximately 3 cm in greatest dimension.  The wound was then  thoroughly irrigated.  Hemostasis was obtained with Bovie cautery.  A small  piece of Surgicel was placed deep in the axilla.  The wound was then closed  with a deep layer of 3-0 Vicryl and a  subcuticular layer of 4-0 Monocryl.  Steri-Strips were applied with Benzoin.  A dry dressing was then applied.  The patient was extubated and brought to  the recovery room in stable condition.  All sponge, needle and instrument  counts were correct.      Imogene Burn. Tsuei, M.D.  Electronically Signed     MKT/MEDQ  D:  05/02/2005  T:  05/02/2005  Job:  [id number]   cc:   Jay Schlichter, M.D.  Fax: [phone]

## 2010-09-16 NOTE — Discharge Summary (Signed)
NAME:  Melody Gray, Melody Gray                          ACCOUNT NO.:  [id number]   MEDICAL RECORD NO.:  [id number]                   PATIENT TYPE:  INP   LOCATION:  6706                                 FACILITY:  [facility]   PHYSICIAN:  C. Milta Deiters, M.D.             DATE OF BIRTH:  04/30/1971   DATE OF ADMISSION:  10/14/2003  DATE OF DISCHARGE:  10/26/2003                                 DISCHARGE SUMMARY   DISCHARGE DIAGNOSES:  1. Breast abscess.  2. Diabetes mellitus, new onset.  3. Hypertension.   DISCHARGE MEDICATIONS:  1. Hydrochlorothiazide 12.5 mg p.o. daily.  2. Percocet 5/325 mg 1-2 tablets q.6h p.r.n. for pain.  3. Ibuprofen 200 mg 2-3 tablets p.r.n. q.6h for pain.   DISCHARGE INSTRUCTIONS:  The patient was instructed to continue her daily  dressing changes in conjunction with her home nursing.   DISCHARGE DIET:  The patient was instructed to continue the 1800 calorie  diabetic diet that was provided in the hospital and to follow nutrition  recommendations.   DISPOSITION:  The patient was discharged home with assistance of [place] for dressing changes.  She has an anticipated return to work date of  mid to late July, after November 13, 2003 if her wound healing continues as  anticipated.   FOLLOW UP APPOINTMENTS:  1. The patient was instructed to contact [facility] for follow     up of incision and drainage in 1-2 weeks.  2. The patient was instructed that she would be contacted by [facility] for primary care follow up in the next 1-2 months.   CONSULTATIONS:  General surgery, Dr. Marlou Starks.   PROCEDURES:  1. Incision and drainage performed by Dr. Marlou Starks on October 18, 2003.  2. Right breast ultrasound performed on October 17, 2003.  RESULTS:  Well-defined fluid collection suggesting abscess.  1. PIC line placement performed on October 22, 2003.   HISTORY OF PRESENT ILLNESS:  [name] is a 40 year old African-American  female who presented  to the [facility] ED with a history of breast  pain and erythema that had been lasting x5 days.  The patient noticed that  she had popped a pimple on her breast prior to initiation of the pain.  From  that time she has noticed continued drainage from that.  She also noticed an  area of firmness and erythema that was covering her right breast.  The  patient also reported new onset of fever and chills the day prior to  admission.   PHYSICAL EXAMINATION:  VITAL SIGNS:  Temperature 99.5, pulse 131, blood  pressure 134/91, respirations 14, oxygen saturation 96 to 98% on room air.  GENERAL:  The patient was oriented x4, she was eating her supper and was in  no apparent distress.  NECK:  Supple with no JVD.  RESPIRATORY EXAM:  Clear to auscultation  bilaterally.  CARDIOVASCULAR EXAM:  Regular rate, normal S1, S2.  No murmurs, rubs, or  gallops.  GI EXAM:  Soft, nontender, nondistended, positive bowel sounds.  BREAST EXAM:      There was a 2 mm vesical over the sternum which the  patient states was the pimple that she had popped prior to admission.  On  the right side of this covering, the inferior quadrants of the breast as  well as some of the superior medial quadrant, there was significant  erythema. There was significant firmness and extreme tenderness of the  bilateral inferior quadrants of the right breast; the left breast was  normal.  NEUROLOGIC EXAM:  Nonfocal.   ADMISSION LABORATORY DATA:  White blood count 18,000, hemoglobin 12,  platelets 290,000, neutrophils 79%, absolute neutrophil count 14.2, MCV  87.5.  Sodium 136, potassium 2.9, chloride 107, bicarb 21, BUN 8, creatinine  0.9, calcium 8.8, AST 31, ALT 36, alkaline phosphatase 69, total bilirubin  0.9, magnesium 2.2.  Blood culture obtained from admission were negative.   HOSPITAL COURSE:  PROBLEM 1.  Right breast abscess.  The patient was  initially placed on vancomycin.  She experienced great improvement of her   erythema on hospital day number 2 and was switched to Ancef with the belief  that this was simply a cellulitis.  However following transition to the  Ancef, the next day the patient had worsening erythema and tenderness and  was started back on vancomycin and Zosyn.  The erythema continued to  progressive despite IV antibiotic therapy and at this point the breast  ultrasound was obtained, the results of which are noted above.  Following  the results of the breast ultrasound, general surgery was consulted and the  patient was taken for incision and drainage of breast abscess the next day.  Following the incision and drainage the patient's tenderness and erythema  resolved greatly.  In addition following surgery the patient's white blood  cell count which had been in the 17,000 to 18,000 range began to drop  quickly and was normal on October 22, 2003.  The patient's white blood cell  count at discharge was 8200.  During the postoperative period the patient  had continual dressing changes as well as pulse lavage hydrotherapy provided  by physical therapy.  This promoted very good wound healing.  In addition,  wound care consult was placed; there was talk about possible skin graft,  however the patient's wound had been healing quite well and it was felt that  this was not necessary at this time.  However if future trouble did arise  then skin graft would possibly be necessary.  While in the hospital the  patient was trained on continued dressing changes for her wound.  However  Home Health was consulted in addition to assist with dressing changes and  for monitoring.   PROBLEM  2.  Type 2 diabetes.  The patient did not have a primary care  physician and had not been to see a physician for some time.  She is a  relatively obese African-American female and had elevated blood sugars with no other explanation.  A morning glucose obtained the day after admission  was found to be 156.  Nutrition was  consulted and patient was placed on a  diabetic diet.  Her morning glucoses then fell to the normal range, thus at  this point she is a diet controlled diabetic.  The patient's hemoglobin A1C  was 6.7, indicating an elevation  of her glucose for some time now.  However  if she continues with her diet control, I expect this hemoglobin A1C to fall  in the same rate that her morning glucoses did.  The patient was given diet  counseling and was very motivated and encouraged to prevent further  progression of her diabetes at discharge.   PROBLEM 3.  Hypertension.  Given the patient's diabetes, the patient's blood  pressure is less than 130/80, and she had multiple blood pressures above  this following admission, thus she was placed on hydrochlorothiazide 12.5 mg  p.o. daily.  This did a very good job controlling the patient's blood  pressure.  However she did have some elevated pressures prior to discharge.  When I see the patient back for follow up she may require additional  medication to control her blood pressures and would likely benefit from an  ACE inhibitor given her diabetes.   PROBLEM 4.  Hypokalemia.  The patient's admission potassium was 2.9, she was  given potassium p.o. to supplement, the potassium then normalized following  several days of p.o. potassium.  She was provided potassium as needed  throughout the remainder of hospital course.  Her discharge potassium was  3.9.   PROBLEM 5.  Cutaneous candidiasis.  The patient did have erythema under her  breast folds consistent with candidiasis.  She was started on Miconazole  powder and had relief of itching as well as the erythema in this area; this  was continued throughout hospital course, however the patient was not  discharged on this medication.  She is to continue proper hygiene in that  area to help prevent future outbreaks of the candidiasis.  In addition,  control of the patient's blood sugars will help this.    DISCHARGE LABORATORY DATA:  Other laboratory data during hospital course  included abscess culture which provided Peptostreptococcus species  sensitivities of which are not available.  In addition a fasting lipid  profile obtained while in the hospital revealed a cholesterol of 97,  triglycerides of 129, HDL of 21 and LDL of 50.  The patient's discharge  hemoglobin is 10.6.   SUGGESTED FOLLOW UP ITEMS:  Continued closure of the incision and drainage  is expected with the dressing changes and this will be followed up by  general surgery.   The patient's hemoglobin A1C will need to be followed up in 3 months, as  well, her blood pressure monitoring.  This will be done in her primary care  follow up.      Elwyn Reach, MD                         Renato Shin, M.D.    TV/MEDQ  D:  11/12/2003  T:  11/13/2003  Job:  [id number]   cc:   Sammuel Hines. Daiva Nakayama, M.D. [id number] [address]  [place]  [place] [phone]  Fax: [phone]

## 2010-09-16 NOTE — Consult Note (Signed)
NAME:  Melody Gray, Melody Gray                          ACCOUNT NO.:  [id number]   MEDICAL RECORD NO.:  [id number]                   PATIENT TYPE:  INP   LOCATION:  6706                                 FACILITY:  [facility]   PHYSICIAN:  Aretha Parrot., M.D.         DATE OF BIRTH:  1971-03-31   DATE OF CONSULTATION:  DATE OF DISCHARGE:                                   CONSULTATION   HISTORY OF PRESENT ILLNESS:  I was asked to see this 40 year old female by  Dr. Autumn Messing.  The patient was admitted to the hospital on October 14, 2003,  with fever, elevated white count and what appeared to be right-breast  cellulitis/ abscess.  Dr. Marlou Starks performed incision and drainage of the right  breast, cellulitis abscess on June 18.  She apparently has had significant  improvement with reduction in fever and white count.  She is currently on  daily physical therapy with pulse lavage and IV vancomycin.  The patient  recently was diagnosed with diabetes mellitus, and she in addition has the  increased risk factors of smoking two packs a day, and is obese.   Examination showed a large, open right-breast wound measuring approximately  7 x11 cm just beneath the right nipple areolar complex.  There was  underlying red granulation tissue on two tracts coursing off considerably to  the side.   My suggestion would be to continue her present regimen of IV vancomycin and  daily physical therapy with pulse lavage.  I would think that this will heal  secondarily.  Once the tunneling has resolved, a vac which could be used on  an outpatient basis might hasten secondary healing.  Another option would be  skin grafting, but I would at least want to see how quickly it might proceed  with allowing it to heal secondarily either with or without a vac.  We will  continue to follow with you.   Thank you very much for asking me to see the patient.                                               Aretha Parrot., M.D.    Binnie Rail  D:  10/22/2003  T:  10/23/2003  Job:  [id number]

## 2010-09-20 ENCOUNTER — Encounter: Payer: Self-pay | Admitting: Internal Medicine

## 2010-10-05 ENCOUNTER — Encounter: Payer: Self-pay | Admitting: Internal Medicine

## 2010-10-05 ENCOUNTER — Ambulatory Visit (INDEPENDENT_AMBULATORY_CARE_PROVIDER_SITE_OTHER): Payer: Medicaid Other | Admitting: Internal Medicine

## 2010-10-05 ENCOUNTER — Ambulatory Visit (INDEPENDENT_AMBULATORY_CARE_PROVIDER_SITE_OTHER)
Admission: RE | Admit: 2010-10-05 | Discharge: 2010-10-05 | Disposition: A | Discharge status: Home / Self Care | Payer: Medicaid Other | Source: Ambulatory Visit | Attending: Internal Medicine | Admitting: Internal Medicine

## 2010-10-05 DIAGNOSIS — I2789 Other specified pulmonary heart diseases: Secondary | ICD-10-CM

## 2010-10-05 DIAGNOSIS — J9610 Chronic respiratory failure, unspecified whether with hypoxia or hypercapnia: Secondary | ICD-10-CM

## 2010-10-05 DIAGNOSIS — D869 Sarcoidosis, unspecified: Secondary | ICD-10-CM | POA: Insufficient documentation

## 2010-10-05 DIAGNOSIS — F17200 Nicotine dependence, unspecified, uncomplicated: Secondary | ICD-10-CM | POA: Insufficient documentation

## 2010-10-05 HISTORY — DX: Other secondary pulmonary hypertension: D86.9

## 2010-10-05 MED ORDER — PREDNISONE (PAK) 10 MG PO TABS: ORAL_TABLET | ORAL | Status: AC

## 2010-10-05 NOTE — Assessment & Plan Note (Signed)
Reviewed need for 24 02, no smoking, increase to 4lpm with activity

## 2010-10-05 NOTE — Assessment & Plan Note (Signed)
I emphasized that although we never turn away smokers from the pulmonary clinic, we do ask that they understand that the recommendations that we make  won't work nearly as well in the presence of continued cigarette exposure.  In fact, we may very well  reach a point where we can't promise to help the patient if he/she can't quit smoking. (We can and will promise to try to help, we just can't promise what we recommend will really work)  

## 2010-10-05 NOTE — Progress Notes (Signed)
Subjective:     Patient ID: Melody Gray, female   DOB: 10/07/1970, 40 y.o.   MRN: [id number]  HPI  32 yobf  Smoker dx of sarcoidosis in 2001 = sob, cough became 02 dep in July 2011 last off prednsione March 2011 but daily since then and complicated by [facility]    January 06, 2010 ov  c/o increased SOB. Pt states outpt clinic advised her to follow up with Dr. Melvyn Novas for Sacoidosis. doe on 02 at 2 lpm sitting then 4 with activity gets off at the curb struggles with grocery store. Try taking Prednisone one half even days in am with breakfast  Be sure you take omeprazole Take one 30-60 min before first and last meals of the day  Stop nifedipine  Start Cardizem 240 mg once daily  Wear 24 hours per day, 2 lpm at rest and sleeping, 4lpm with activity, this is the best way to help your heart function better  CT Chest ( repeated with contrast)> no PE, just sarcoid changes   February 09, 2010 Followup sarcoid w/ PFTs. Breathing is the same- no better or worse.on 02 2 lpm w/in and then outside using 4lpm> cc doe x 50 ft still has to stop every aisle at [employer] but not [facility] parking. rec Start Dulera 2 puffs first thing in am and 2 puffs again in pm about 12 hours later and prednisone 10 mg every other day.   May 04, 2010 ov cc doe some better to point to where feels needs 02 less. rec  1) Stay on dulera 200 2 puffs first thing in am and 2 puffs again in pm about 12 hours later  2) Work on perfecting inhaler technique 3) Prednisone 20 mg 1/4 every other day  4) Stay on 02 2lpm 24 hours per day except increase to 4lpm with exercise  5) Weight control t thru ex    10/05/2010 ov/Kandas Oliveto back smoking and using 02 most of the time at 2 lpm never up to 4 and prednisone 10mg   One half every other day. No cough. Sleeping ok without nocturnal  or early am exac of resp c/o's or need for noct saba.    Pt denies any significant sore throat, dysphagia, itching, sneezing,  nasal congestion or excess/ purulent  secretions,  fever, chills, sweats, unintended wt loss, pleuritic or exertional cp, hempoptysis, orthopnea pnd or leg swelling.    Also denies any obvious fluctuation of symptoms with weather or environmental changes or other aggravating or alleviating factors.       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 per biopsy 12/09  - Reversible airway component so start [facility] 01/2010 > better  - HFa 75% p coaching May 04, 2010  Unexplained Hypoxemia July 2011  - CT angiogram 01/07/10 >>> no PE  - PFT's February 09, 2010 FEV1 1.20 (49%) with ratio 72 and FRC 90%, FEV1  16% better p B2, DLC0 33% > corrects to 84  - 02 sats ok on 4lpm x rapid walk x 3 laps May 04, 2010 > Walked 3 laps @ 185 ft each stopped due to   Morbid obesity  - Target wt = 153 for BMI < 30  QT prolongation  Diabetes mellitus, type II  Hypertension  Heart catheterization 06-06-06 : No CAD, no RAS, normal EF  Hx eclampsia  Hx of scalp seborrheic dermatitis       Review of Systems     Objective:   Physical Exam  She is a cushingoid-appearing amb bf nad on 02 2lpm  wt 238 January 06, 2010 > 222 February 09, 2010 > 217 May 04, 2010 > 203 10/05/2010  There is no stigmata liver disease  skin: anicteric  HEENT: normocephalic; PEERLA; no nasal or pharyngeal abnormalities  neck: supple  nodes: no cervical lymphadenopathy  chest: clear to ausculatation and percussion  heart: no murmurs, gallops, or rubs  abd: soft, nontender; BS normoactive; no abdominal masses, tenderness, organomegaly; abdomen is obese  rectal: deferred  ext: no cynanosis, clubbing, edema      Assessment:          Plan:

## 2010-10-05 NOTE — Progress Notes (Signed)
Addended by: Manson Allan on: 10/05/2010 11:37 AM   Modules accepted: Orders

## 2010-10-05 NOTE — Assessment & Plan Note (Signed)
The goal with a chronic steroid dependent illness is always arriving at the lowest effective dose that controls the disease/symptoms and not accepting a set "formula" which is based on statistics or guidelines that don't always take into account patient  variability or the natural hx of the dz in every individual patient, which may well vary over time.  For now therefore I recommend the patient maintain    For now on near physiologic levels at 5 mg qod and no flare so continue

## 2010-10-05 NOTE — Patient Instructions (Addendum)
1) Stay on dulera 200 2 puffs first thing in am and 2 puffs again in pm about 12 hours later  2) Work on perfecting inhaler technique: relax and blow all the way out then take a nice smooth deep breath back in, triggering the inhaler at same time you start breathing  3) Prednisone 10  mg 1/2 every other day  4) Stay on 02 2lpm 24 hours per day except increase to 4lpm with exercise  5) Weight control t thru exercise on 02 4lpm 6) Please see patient coordinator before you leave today  to schedule repeat echo and we will call when it comes back for review   It is critical that you not smoke at all   Please schedule a follow up visit in 3 months but call sooner if needed

## 2010-10-13 ENCOUNTER — Other Ambulatory Visit (HOSPITAL_COMMUNITY): Payer: Medicaid Other

## 2010-10-14 ENCOUNTER — Ambulatory Visit (HOSPITAL_COMMUNITY): Payer: Medicaid Other | Attending: Internal Medicine | Admitting: Radiology

## 2010-10-14 DIAGNOSIS — F17200 Nicotine dependence, unspecified, uncomplicated: Secondary | ICD-10-CM | POA: Insufficient documentation

## 2010-10-14 DIAGNOSIS — D869 Sarcoidosis, unspecified: Secondary | ICD-10-CM

## 2010-10-14 DIAGNOSIS — R0602 Shortness of breath: Secondary | ICD-10-CM

## 2010-10-14 DIAGNOSIS — I2789 Other specified pulmonary heart diseases: Secondary | ICD-10-CM | POA: Insufficient documentation

## 2010-10-16 ENCOUNTER — Encounter: Payer: Self-pay | Admitting: Internal Medicine

## 2010-10-18 ENCOUNTER — Telehealth: Payer: Self-pay | Admitting: Internal Medicine

## 2010-10-18 DIAGNOSIS — I509 Heart failure, unspecified: Secondary | ICD-10-CM

## 2010-10-18 NOTE — Progress Notes (Signed)
Quick Note:  Spoke with pt and notified of results per Dr. Wert. Pt verbalized understanding and denied any questions.  ______ 

## 2010-10-18 NOTE — Telephone Encounter (Signed)
Spoke with pt and she states nurse called her this morning with echo results and advised her to f/u with Dr. Georgina Peer. She states she has never seen a Dr. Claiborne Billings before. She is requesting a referral to [facility]. Please advise. [place] Bing, CMA

## 2010-10-19 NOTE — Telephone Encounter (Signed)
Ok to refer to [facility] (Dr Claiborne Billings did her cath but it's fine to keep her care here if she prefers)

## 2010-10-19 NOTE — Telephone Encounter (Signed)
Called, spoke with pt.  She is aware Dr. Claiborne Billings did her cath.  She would still like to be referred to [facility] -- order placed.  Pt aware she will receive call regarding appt and verbalized understanding of this.

## 2010-11-17 ENCOUNTER — Ambulatory Visit (INDEPENDENT_AMBULATORY_CARE_PROVIDER_SITE_OTHER): Payer: Medicaid Other | Admitting: Cardiology

## 2010-11-17 ENCOUNTER — Ambulatory Visit: Payer: Medicaid Other | Admitting: Cardiology

## 2010-11-17 ENCOUNTER — Encounter: Payer: Self-pay | Admitting: Cardiology

## 2010-11-17 VITALS — BP 118/75 | HR 107 | Resp 16 | Ht 61.0 in | Wt 197.0 lb

## 2010-11-17 DIAGNOSIS — I428 Other cardiomyopathies: Secondary | ICD-10-CM

## 2010-11-17 DIAGNOSIS — F17200 Nicotine dependence, unspecified, uncomplicated: Secondary | ICD-10-CM

## 2010-11-17 MED ORDER — LISINOPRIL 5 MG PO TABS: 5.0000 mg | ORAL_TABLET | Freq: Every day | ORAL | Status: DC

## 2010-11-17 MED ORDER — CARVEDILOL 3.125 MG PO TABS: 3.1250 mg | ORAL_TABLET | Freq: Two times a day (BID) | ORAL | Status: DC

## 2010-11-17 MED ORDER — LISINOPRIL 2.5 MG PO TABS: 2.5000 mg | ORAL_TABLET | Freq: Every day | ORAL | Status: DC

## 2010-11-17 NOTE — Assessment & Plan Note (Signed)
Adjust blood pressure medications as prescribed.

## 2010-11-17 NOTE — Progress Notes (Signed)
HPI: 40 year old female for evaluation of abnormal echocardiogram. Cardiac catheterization in 2008 revealed no coronary artery disease, ejection fraction 50-55% and no renal artery stenosis. Echocardiogram in June of 2012 revealed an ejection fraction of 35% which was new. There was mild biatrial enlargement, mild right ventricular enlargement and mildly reduced RV function. Because of the above we were asked to further evaluate. Patient has chronic dyspnea on exertion which is unchanged and attributed to sarcoid. No orthopnea, PND, palpitations, syncope or chest pain. No pedal edema while taking diuretics.   Current Outpatient Prescriptions  Medication Sig Dispense Refill  . esomeprazole (NEXIUM) 20 MG capsule Take 20 mg by mouth daily before breakfast.        . furosemide (LASIX) 40 MG tablet Take 1 tablet by mouth daily  30 tablet  3  . insulin NPH-insulin regular (NOVOLIN 70/30) (70-30) 100 UNIT/ML injection Inject into the skin. Inject 30 units 30 minutes before breakfast and 30 units 30 minutes before evening meal.       . Insulin Syringe-Needle U-100 28G X 1/2" 0.5 ML MISC by Does not apply route as directed.        . Lancets MISC by Does not apply route 3 (three) times daily.        . metFORMIN (GLUCOPHAGE) 1000 MG tablet Take 1 tablet (1,000 mg total) by mouth 2 (two) times daily with a meal.  60 tablet  6  . Mometasone Furo-Formoterol Fum (DULERA) 200-5 MCG/ACT AERO Inhale into the lungs. 2 puffs first thing  in am and 2 puffs again in pm about 12 hours later       . predniSONE (DELTASONE) 20 MG tablet Take 20 mg by mouth daily.        . carvedilol (COREG) 3.125 MG tablet Take 1 tablet (3.125 mg total) by mouth 2 (two) times daily.  60 tablet  11  . lisinopril (PRINIVIL,ZESTRIL) 5 MG tablet Take 1 tablet (5 mg total) by mouth daily.  30 tablet  11    Allergies  Allergen Reactions  . Vicodin (Hydrocodone-Acetaminophen)     itch    Past Medical History  Diagnosis Date  . Sarcoidosis      Followed by Dr. Melvyn Novas; w/ liver involvement per biopsy 12/09, Reversible airway component so started on [facility] 01/2010; HFA 75% p coaching 05/2010  . Hypoxemia     CT angiogram 9/11>> No PE; PFTs 10/11- FEV1 1.20 (49%) with 16% better p B2, DLCO 33%> corrects to 84; O2 sats ok on 4 lpm X rapid walk X 3 laps 05/2010  . Morbid obesity     Target wt= 153 for BMI <30  . QT prolongation   . Diabetes mellitus   . Hypertension   . Hx of cardiac cath 2/08    No CAD, no RAS,  normal EF  . Seborrheic dermatitis of scalp   . 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 Hep B serology neg. but Hep C ab +, HIV neg. 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 Cardiomyopathy, nonischemic     EF 45% 12/10; Echo 7/11 normal EF, PAS 48  . Diabetic retinopathy     Right eye 2/11  . Health maintenance examination     Mammogram 05/2010 Negative; Last Pap smear 03/2008; Last DM eye exam 2/11> mild non-proliferative diabetic retinopathy. OD    Past Surgical History  Procedure Date  .  Tubal ligation   . Breast surgery   . Cesarean section     History   Social History  . Marital Status: Single    Spouse Name: N/A    Number of Children: 2  . Years of Education: N/A   Occupational History  .     Social History Main Topics  . Smoking status: Current Some Day Smoker -- 0.5 packs/day for 20 years    Types: Cigarettes  . Smokeless tobacco: Never Used  . Alcohol Use: Yes     rare  . Drug Use: No  . Sexually Active: Not on file   Other Topics Concern  . Not on file   Social History Narrative   Diabetic card given 05/03/2010.Financial assistance approved for 100% discount at [facility] and has [facility] card. 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    Family History  Problem Relation Age of Onset  . Cancer Mother     colon cancer  . Multiple sclerosis Father   . Asthma Sister      in childhood    ROS: no fevers or chills, productive cough, hemoptysis, dysphasia, odynophagia, melena, hematochezia, dysuria, hematuria, rash, seizure activity, orthopnea, PND, pedal edema, claudication. Remaining systems are negative.  Physical Exam: General:  Well developed/well nourished in NAD Skin warm/dry Patient not depressed No peripheral clubbing Back-normal HEENT-normal/normal eyelids Neck supple/normal carotid upstroke bilaterally; no bruits; no JVD; no thyromegaly chest - CTA/ normal expansion CV - RRR/normal S1 and S2; no murmurs, rubs or gallops;  PMI nondisplaced Abdomen -NT/ND, hepatomegaly noted and percussed at 2 cm below costal margin, no mass, + bowel sounds, no bruit 2+ femoral pulses, no bruits Ext-no edema, chords, 2+ DP Neuro-grossly nonfocal  ECG sinus tachycardia at a rate of 107. No ST changes.

## 2010-11-17 NOTE — Assessment & Plan Note (Signed)
Management per primary care. 

## 2010-11-17 NOTE — Assessment & Plan Note (Signed)
Patient counseled on discontinuing. 

## 2010-11-17 NOTE — Assessment & Plan Note (Addendum)
Etiology unclear. Given long history of sarcoid I am concerned about cardiac involvement. I will schedule a cardiac MRI to exclude and better assess LV function. Check TSH and ferritin. Note she has cardiac risk factors but previous catheterization normal. If there is no evidence of cardiac sarcoid and ejection fraction remains 30-35% then she will most likely require repeat cardiac catheterization. Discontinue Cardizem. Begin Coreg 3.125 mg p.o. B.i.d. And lisinopril 2.5 mg daily. Titrate as tolerated by pulse and blood pressure. Check potassium and renal function in one week (? Previous renal insuff with ACE-I; previous cath showed no RAS).

## 2010-11-17 NOTE — Patient Instructions (Addendum)
Your physician recommends that you schedule a follow-up appointment in: [place] has requested that you have a cardiac MRI. Cardiac MRI uses a computer to create images of your heart as its beating, producing both still and moving pictures of your heart and major blood vessels. For further information please visit [email or web address] Please follow the instruction sheet given to you today for more information.   Your physician recommends that you return for lab work in: MONDAY  START LISINOPRIL 2.5 MG ONCE DAILY  START CARVEDILOL 3.125 MG ONE TABLET TWICE DAILY

## 2010-11-21 ENCOUNTER — Other Ambulatory Visit (INDEPENDENT_AMBULATORY_CARE_PROVIDER_SITE_OTHER): Payer: Medicaid Other | Admitting: *Deleted

## 2010-11-21 DIAGNOSIS — I10 Essential (primary) hypertension: Secondary | ICD-10-CM

## 2010-11-21 DIAGNOSIS — I428 Other cardiomyopathies: Secondary | ICD-10-CM

## 2010-11-21 LAB — BASIC METABOLIC PANEL
BUN: 18 mg/dL (ref 6–23)
CO2: 27 mEq/L (ref 19–32)
Calcium: 9.6 mg/dL (ref 8.4–10.5)
Chloride: 96 mEq/L (ref 96–112)
Creatinine, Ser: 0.9 mg/dL (ref 0.4–1.2)
GFR: 84.04 mL/min (ref >60.00)
Glucose, Bld: 199 mg/dL — ABNORMAL HIGH (ref 70–99)
Potassium: 3.4 mEq/L — ABNORMAL LOW (ref 3.5–5.1)
Sodium: 133 mEq/L — ABNORMAL LOW (ref 135–145)

## 2010-11-21 LAB — TSH: TSH: 3.49 u[IU]/mL (ref 0.35–5.50)

## 2010-11-21 LAB — FERRITIN: Ferritin: 67.8 ng/mL (ref 10.0–291.0)

## 2010-11-23 ENCOUNTER — Other Ambulatory Visit: Payer: Self-pay | Admitting: *Deleted

## 2010-11-23 ENCOUNTER — Telehealth: Payer: Self-pay | Admitting: *Deleted

## 2010-11-23 DIAGNOSIS — E876 Hypokalemia: Secondary | ICD-10-CM

## 2010-11-23 MED ORDER — POTASSIUM CHLORIDE CRYS ER 20 MEQ PO TBCR: 20.0000 meq | EXTENDED_RELEASE_TABLET | Freq: Every day | ORAL | Status: DC

## 2010-11-23 NOTE — Telephone Encounter (Signed)
Spoke with pt, she will pick up the potassium 12-02-10. She will increase potassium rich foods until then. Labs will be checked one week after starting k+ Fredia Beets

## 2010-11-23 NOTE — Telephone Encounter (Signed)
Message copied by Cristopher Estimable on Wed Nov 23, 2010 12:34 PM ------      Message from: Lelon Perla      Created: Mon Nov 21, 2010  1:49 PM       kcl 20 meq po daily, bmet one week      Kirk Ruths

## 2010-11-30 ENCOUNTER — Inpatient Hospital Stay (HOSPITAL_COMMUNITY): Admission: RE | Admit: 2010-11-30 | Payer: Medicaid Other | Source: Ambulatory Visit

## 2010-12-01 ENCOUNTER — Encounter: Payer: Self-pay | Admitting: Cardiology

## 2010-12-05 ENCOUNTER — Other Ambulatory Visit (INDEPENDENT_AMBULATORY_CARE_PROVIDER_SITE_OTHER): Payer: Medicaid Other | Admitting: *Deleted

## 2010-12-05 ENCOUNTER — Encounter: Payer: Self-pay | Admitting: *Deleted

## 2010-12-05 DIAGNOSIS — I10 Essential (primary) hypertension: Secondary | ICD-10-CM

## 2010-12-05 DIAGNOSIS — E876 Hypokalemia: Secondary | ICD-10-CM

## 2010-12-05 LAB — BASIC METABOLIC PANEL
BUN: 21 mg/dL (ref 6–23)
CO2: 23 mEq/L (ref 19–32)
Calcium: 10 mg/dL (ref 8.4–10.5)
Chloride: 98 mEq/L (ref 96–112)
Creatinine, Ser: 1.1 mg/dL (ref 0.4–1.2)
GFR: 71.59 mL/min (ref >60.00)
Glucose, Bld: 164 mg/dL — ABNORMAL HIGH (ref 70–99)
Potassium: 4 mEq/L (ref 3.5–5.1)
Sodium: 133 mEq/L — ABNORMAL LOW (ref 135–145)

## 2010-12-07 ENCOUNTER — Encounter: Payer: Self-pay | Admitting: Cardiology

## 2010-12-07 ENCOUNTER — Ambulatory Visit (HOSPITAL_COMMUNITY)
Admission: RE | Admit: 2010-12-07 | Discharge: 2010-12-07 | Disposition: A | Discharge status: Home / Self Care | Payer: Medicaid Other | Source: Ambulatory Visit | Attending: Cardiology | Admitting: Cardiology

## 2010-12-07 DIAGNOSIS — I428 Other cardiomyopathies: Secondary | ICD-10-CM

## 2010-12-07 DIAGNOSIS — J99 Respiratory disorders in diseases classified elsewhere: Secondary | ICD-10-CM | POA: Insufficient documentation

## 2010-12-07 DIAGNOSIS — D869 Sarcoidosis, unspecified: Secondary | ICD-10-CM | POA: Insufficient documentation

## 2010-12-07 DIAGNOSIS — I519 Heart disease, unspecified: Secondary | ICD-10-CM | POA: Insufficient documentation

## 2010-12-07 MED ORDER — GADOBENATE DIMEGLUMINE 529 MG/ML IV SOLN
30.0000 mL | Freq: Once | INTRAVENOUS | Status: AC
  Administered 2010-12-07: 30 mL via INTRAVENOUS

## 2010-12-16 ENCOUNTER — Encounter: Payer: Self-pay | Admitting: Internal Medicine

## 2010-12-16 ENCOUNTER — Ambulatory Visit (INDEPENDENT_AMBULATORY_CARE_PROVIDER_SITE_OTHER): Payer: Medicaid Other | Admitting: Internal Medicine

## 2010-12-16 VITALS — BP 111/74 | HR 51 | Temp 97.2°F | Ht 61.0 in | Wt 200.8 lb

## 2010-12-16 DIAGNOSIS — M25559 Pain in unspecified hip: Secondary | ICD-10-CM

## 2010-12-16 DIAGNOSIS — I10 Essential (primary) hypertension: Secondary | ICD-10-CM

## 2010-12-16 DIAGNOSIS — M25552 Pain in left hip: Secondary | ICD-10-CM

## 2010-12-16 DIAGNOSIS — Z0000 Encounter for general adult medical examination without abnormal findings: Secondary | ICD-10-CM

## 2010-12-16 DIAGNOSIS — D869 Sarcoidosis, unspecified: Secondary | ICD-10-CM

## 2010-12-16 DIAGNOSIS — F17200 Nicotine dependence, unspecified, uncomplicated: Secondary | ICD-10-CM

## 2010-12-16 DIAGNOSIS — I509 Heart failure, unspecified: Secondary | ICD-10-CM

## 2010-12-16 DIAGNOSIS — IMO0001 Reserved for inherently not codable concepts without codable children: Secondary | ICD-10-CM

## 2010-12-16 DIAGNOSIS — E119 Type 2 diabetes mellitus without complications: Secondary | ICD-10-CM

## 2010-12-16 DIAGNOSIS — D649 Anemia, unspecified: Secondary | ICD-10-CM

## 2010-12-16 LAB — GLUCOSE, CAPILLARY: Glucose-Capillary: 94 mg/dL (ref 70–99)

## 2010-12-16 LAB — POCT GLYCOSYLATED HEMOGLOBIN (HGB A1C): Hemoglobin A1C: 5.9

## 2010-12-16 MED ORDER — FERROUS SULFATE 325 (65 FE) MG PO TABS: 325.0000 mg | ORAL_TABLET | Freq: Two times a day (BID) | ORAL | Status: DC

## 2010-12-16 MED ORDER — PREDNISONE 20 MG PO TABS: 10.0000 mg | ORAL_TABLET | ORAL | Status: DC

## 2010-12-16 MED ORDER — NICOTINE 14 MG/24HR TD PT24: 1.0000 | MEDICATED_PATCH | TRANSDERMAL | Status: AC

## 2010-12-16 MED ORDER — METFORMIN HCL 1000 MG PO TABS: 1000.0000 mg | ORAL_TABLET | Freq: Two times a day (BID) | ORAL | Status: DC

## 2010-12-16 MED ORDER — FUROSEMIDE 40 MG PO TABS: ORAL_TABLET | ORAL | Status: DC

## 2010-12-16 MED ORDER — NICOTINE 7 MG/24HR TD PT24: 1.0000 | MEDICATED_PATCH | TRANSDERMAL | Status: AC

## 2010-12-16 NOTE — Progress Notes (Signed)
  Subjective:    Patient ID: Melody Gray, female    DOB: 1970-07-21, 40 y.o.   MRN: [id number]  HPI Patient is a 40 year old woman who comes for followup of multiple medical issues.  #Sarcoid: Patient followed closely by Dr. Melvyn Novas of [facility] pulmonology. Patient reports that she is not on O2 21st day. She takes breaks rate because of skin irritation. He does sleep with it as advised. Breathing is at baseline at this time. Nicotine cessation and following up her cardiac findings are 2 other related issues to this problem.  #Smoking cessation: Patient reports trying to quit cold [place] roughly one month ago unsuccessfully and continues to smoke roughly one pack every 3 days. She is interested in trying nicotine replacement therapy or medical therapy for smoking cessation.  #Diabetes: Patient continues to take insulin 7030 30 units with breakfast and dinner as well as metformin twice daily. She reports no episodes of shaking, sweatiness, or dizziness. Her last A1c was 6.0 in March of 2012.  #Patient continues to have left hip pain that was initially worked up in March of 2012. 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  #Routine healthcare: Patient says she went to [facility] for monitoring of diabetic retinopathy, and the results were negative. The patient was unable to get a Pap smear at [facility] as she desired over the summer, and would like a repeat referral.   Review of Systems     Objective:   Physical Exam Vitals: T: 97.2   HR:  51   BP:  111/74  General: NAD, pleasant, cooperative HEENT: PERRL, EOMI, no scleral icterus Cardiac: RRR, no rubs, murmurs or gallops Pulm: clear to auscultation bilaterally, moving normal volumes of air Abd: soft, nontender, nondistended, BS present Ext: warm and well perfused, no pedal edema. L hip pain c internal rotation, neg straight leg raise, no TTP over  trochanter. Neuro: alert and oriented X3, cranial nerves II-XII grossly intact, strength and sensation to light touch equal in bilateral upper and lower extremities        Assessment & Plan:

## 2010-12-16 NOTE — Assessment & Plan Note (Signed)
This patient has tried both quitting cold [place] as well as using nicotine patches.  Nicotine patches had previously given her a pruritic reaction as an outpatient, however in the hospital she had responded well to nicotine replacement therapy. It has been over 2 years since she tried outpatient nicotine patches, and it is possible that the brand or formulary has since changed. Thus we will prescribe her a course of nicotine patch therapy to try. We discussed that if she develops a reaction that she should stop and return to the office immediately for discussion and prescription of either Zyban or Chantix to help her. We also discussed the option of alternative nicotine replacement therapies like gum or lozenges. --nicoderm patch --gum vs zyban vs chantix if can't tolerate patches.

## 2010-12-16 NOTE — Assessment & Plan Note (Signed)
This patient continues to have left hip pain concerning for AVN versus osteo- arthritis. She does not have tenderness to palpation, making trochanteric bursitis less likely. Arrangement of MRI had been attempted in March of this year and was unsuccessful. We will start with a left hip x-ray series and move forward from there. --Followup x-ray results

## 2010-12-16 NOTE — Assessment & Plan Note (Signed)
Patient followed by Dr. Melvyn Novas for continued management of her sarcoidosis.  Per Dr. Melvyn Novas, the most important things for this patient at this time remains smoking cessation. We will attempt to assist her with this.

## 2010-12-16 NOTE — Assessment & Plan Note (Signed)
--  Saw [facility] earlier this year --Refer to [facility] for Pap smear. Also Splenda we would be happy to do a Pap smear and clinic here.

## 2010-12-16 NOTE — Assessment & Plan Note (Signed)
Patient's blood pressure today was 111/74, and back the patient described some episodes of dizziness. It is unclear the exact etiology of this disease but is possible that she is overmedicated with antihypertensives given that she was just started on both Coreg and lisinopril. These could also be related to hypoglycemia. She did not seem to be overly concerned with these episodes, and I chose not to alter her regimen today. --Continue meds as prescribed

## 2010-12-16 NOTE — Assessment & Plan Note (Signed)
He would A1c was 5.9 today. This is likely too low for this patient and we will decrease her insulin to 25 units down from 30 units twice a day. She'll continue metformin.

## 2010-12-16 NOTE — Assessment & Plan Note (Addendum)
Exam was unremarkable for signs of congestion. Cardiac MR indicates unlikely sarcoid involvement. Discussed these results with patient. Patient has appointment on September 5 with Dr. Stanford Breed to continue workup for her decreased ejection fraction. May need repeat cardiac catheterization.

## 2010-12-30 ENCOUNTER — Other Ambulatory Visit: Payer: Self-pay | Admitting: *Deleted

## 2010-12-30 DIAGNOSIS — IMO0001 Reserved for inherently not codable concepts without codable children: Secondary | ICD-10-CM

## 2010-12-30 MED ORDER — INSULIN NPH ISOPHANE & REGULAR (70-30) 100 UNIT/ML ~~LOC~~ SUSP: 25.0000 [IU] | Freq: Two times a day (BID) | SUBCUTANEOUS | Status: DC

## 2011-01-04 ENCOUNTER — Encounter: Payer: Self-pay | Admitting: *Deleted

## 2011-01-04 ENCOUNTER — Ambulatory Visit (INDEPENDENT_AMBULATORY_CARE_PROVIDER_SITE_OTHER): Payer: Medicaid Other | Admitting: Cardiology

## 2011-01-04 ENCOUNTER — Encounter: Payer: Self-pay | Admitting: Cardiology

## 2011-01-04 DIAGNOSIS — F17200 Nicotine dependence, unspecified, uncomplicated: Secondary | ICD-10-CM

## 2011-01-04 DIAGNOSIS — I428 Other cardiomyopathies: Secondary | ICD-10-CM

## 2011-01-04 DIAGNOSIS — Z01810 Encounter for preprocedural cardiovascular examination: Secondary | ICD-10-CM

## 2011-01-04 DIAGNOSIS — I10 Essential (primary) hypertension: Secondary | ICD-10-CM

## 2011-01-04 LAB — CBC WITH DIFFERENTIAL/PLATELET
Basophils Absolute: 0 10*3/uL (ref 0.0–0.1)
Basophils Relative: 0.3 % (ref 0.0–3.0)
Eosinophils Absolute: 0.2 10*3/uL (ref 0.0–0.7)
Eosinophils Relative: 3.9 % (ref 0.0–5.0)
HCT: 32.8 % — ABNORMAL LOW (ref 36.0–46.0)
Hemoglobin: 10.9 g/dL — ABNORMAL LOW (ref 12.0–15.0)
Lymphocytes Relative: 24 % (ref 12.0–46.0)
Lymphs Abs: 1.4 10*3/uL (ref 0.7–4.0)
MCHC: 33.4 g/dL (ref 30.0–36.0)
MCV: 86.5 fl (ref 78.0–100.0)
Monocytes Absolute: 0.8 10*3/uL (ref 0.1–1.0)
Monocytes Relative: 13.9 % — ABNORMAL HIGH (ref 3.0–12.0)
Neutro Abs: 3.4 10*3/uL (ref 1.4–7.7)
Neutrophils Relative %: 57.9 % (ref 43.0–77.0)
Platelets: 206 10*3/uL (ref 150.0–400.0)
RBC: 3.79 Mil/uL — ABNORMAL LOW (ref 3.87–5.11)
RDW: 16.4 % — ABNORMAL HIGH (ref 11.5–14.6)
WBC: 5.8 10*3/uL (ref 4.5–10.5)

## 2011-01-04 LAB — BASIC METABOLIC PANEL
BUN: 16 mg/dL (ref 6–23)
CO2: 24 mEq/L (ref 19–32)
Calcium: 9.8 mg/dL (ref 8.4–10.5)
Chloride: 103 mEq/L (ref 96–112)
Creatinine, Ser: 0.8 mg/dL (ref 0.4–1.2)
GFR: 95.63 mL/min (ref >60.00)
Glucose, Bld: 133 mg/dL — ABNORMAL HIGH (ref 70–99)
Potassium: 4.1 mEq/L (ref 3.5–5.1)
Sodium: 137 mEq/L (ref 135–145)

## 2011-01-04 LAB — PROTIME-INR
INR: 1 ratio (ref 0.8–1.0)
Prothrombin Time: 11.7 s (ref 10.2–12.4)

## 2011-01-04 MED ORDER — LISINOPRIL 5 MG PO TABS: 5.0000 mg | ORAL_TABLET | Freq: Every day | ORAL | Status: DC

## 2011-01-04 MED ORDER — CARVEDILOL 6.25 MG PO TABS: 6.2500 mg | ORAL_TABLET | Freq: Two times a day (BID) | ORAL | Status: DC

## 2011-01-04 NOTE — Assessment & Plan Note (Signed)
Blood pressure controlled. Continue present medications. 

## 2011-01-04 NOTE — Patient Instructions (Addendum)
Your physician has requested that you have a cardiac catheterization. Cardiac catheterization is used to diagnose and/or treat various heart conditions. Doctors may recommend this procedure for a number of different reasons. The most common reason is to evaluate chest pain. Chest pain can be a symptom of coronary artery disease (CAD), and cardiac catheterization can show whether plaque is narrowing or blocking your heart's arteries. This procedure is also used to evaluate the valves, as well as measure the blood flow and oxygen levels in different parts of your heart. For further information please visit [email or web address]. Please follow instruction sheet, as given.   Your physician recommends that you return for lab work in: [place]   Your physician recommends that you schedule a follow-up appointment in: 6 WEEKS  INCREASE LISINOPRIL TO 5 MG ONCE DAILY  INCREASE CARVEDILOL TO 6.25 MG TWICE DAILY

## 2011-01-04 NOTE — Assessment & Plan Note (Addendum)
Etiology remains unclear. Ferritin and TSH normal. Cardiac MRI not classic for cardiac sarcoid. I will arrange a cardiac catheterization to exclude coronary disease. The risks and benefits have been discussed and she agrees to proceed. Increase lisinopril to 5 mg daily and Coreg 6.25 mg b.i.d. After titrating medications Will repeat echocardiogram. If ejection fraction less than or equal to 35% she will need ICD. Check potassium and renal function one week after increasing lisinopril.

## 2011-01-04 NOTE — Progress Notes (Signed)
HPI: Pleasant female I saw in July of 2012 for evaluation of abnormal echocardiogram. Cardiac catheterization in 2008 revealed no coronary artery disease, ejection fraction 50-55% and no renal artery stenosis. Echocardiogram in June of 2012 revealed an ejection fraction of 35% which was new. There was mild biatrial enlargement, mild right ventricular enlargement and mildly reduced RV function. Cardiac MRI in July of 2012 revealed Moderate to severe LV systolic dysfunction, EF 123456. The anterior and anterolateral walls appeared severely hypokinetic. Mild RV dilation with mild systolic dysfunction. Possible small areas of mid wall delayed enhancement in the apical septal and apical lateral walls. These areas were very faint and not definitely of clinical significance. Mid wall enhancement can be found in infiltrative cardiomyopathies such as that due to sarcoidosis. TSH and ferritin normal. Since I last saw her, the patient has dyspnea with more extreme activities but not with routine activities. It is relieved with rest. It is not associated with chest pain. There is no orthopnea, PND or pedal edema. There is no syncope or palpitations. There is no exertional chest pain. Occasional brief episodes of pain in the substernal area unrelated to exertion. These last 2-3 minutes and resolve.      Current Outpatient Prescriptions  Medication Sig Dispense Refill  . carvedilol (COREG) 3.125 MG tablet Take 1 tablet (3.125 mg total) by mouth 2 (two) times daily.  60 tablet  11  . esomeprazole (NEXIUM) 20 MG capsule Take 20 mg by mouth daily before breakfast.        . ferrous sulfate 325 (65 FE) MG tablet Take 1 tablet (325 mg total) by mouth 2 (two) times daily.  60 tablet  5  . furosemide (LASIX) 40 MG tablet Take 1 tablet by mouth daily  30 tablet  3  . insulin NPH-insulin regular (NOVOLIN 70/30) (70-30) 100 UNIT/ML injection Inject 25 Units into the skin 2 (two) times daily before a meal.  50 mL  3  . Insulin  Syringe-Needle U-100 28G X 1/2" 0.5 ML MISC by Does not apply route as directed.        . Lancets MISC by Does not apply route 3 (three) times daily.        Marland Kitchen lisinopril (PRINIVIL,ZESTRIL) 2.5 MG tablet Take 1 tablet (2.5 mg total) by mouth daily.  30 tablet  11  . metFORMIN (GLUCOPHAGE) 1000 MG tablet Take 1 tablet (1,000 mg total) by mouth 2 (two) times daily with a meal.  60 tablet  6  . Mometasone Furo-Formoterol Fum (DULERA) 200-5 MCG/ACT AERO Inhale into the lungs. 2 puffs first thing  in am and 2 puffs again in pm about 12 hours later       . nicotine (NICODERM CQ - DOSED IN MG/24 HOURS) 14 mg/24hr patch Place 1 patch onto the skin daily.  30 patch  1  . nicotine (NICODERM CQ - DOSED IN MG/24 HR) 7 mg/24hr patch Place 1 patch onto the skin daily.  14 patch  0  . potassium chloride SA (K-DUR,KLOR-CON) 20 MEQ tablet Take 1 tablet (20 mEq total) by mouth daily.  30 tablet  12  . predniSONE (DELTASONE) 20 MG tablet Take 0.5 tablets (10 mg total) by mouth every other day. Please take 5mg  total every other day as discussed with Dr. Melvyn Novas  30 tablet  4     Past Medical History  Diagnosis Date  . Sarcoidosis     Followed by Dr. Melvyn Novas; w/ liver involvement per biopsy 12/09, Reversible airway component so  started on [facility] 01/2010; HFA 75% p coaching 05/2010  . Hypoxemia     CT angiogram 9/11>> No PE; PFTs 10/11- FEV1 1.20 (49%) with 16% better p B2, DLCO 33%> corrects to 84; O2 sats ok on 4 lpm X rapid walk X 3 laps 05/2010  . Morbid obesity     Target wt= 153 for BMI <30  . QT prolongation   . Diabetes mellitus   . Hypertension   . Hx of cardiac cath 2/08    No CAD, no RAS,  normal EF  . Seborrheic dermatitis of scalp   . 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 Hep B serology neg. but Hep C ab +, HIV neg. 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 Cardiomyopathy, nonischemic     EF 45% 12/10; Echo 7/11 normal EF, PAS  48  . Diabetic retinopathy     Right eye 2/11  . Health maintenance examination     Mammogram 05/2010 Negative; Last Pap smear 03/2008; Last DM eye exam 2/11> mild non-proliferative diabetic retinopathy. OD    Past Surgical History  Procedure Date  . Tubal ligation   . Breast surgery   . Cesarean section     History   Social History  . Marital Status: Single    Spouse Name: N/A    Number of Children: 2  . Years of Education: N/A   Occupational History  . 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 Smoking status: Current Some Day Smoker -- 0.5 packs/day for 20 years    Types: Cigarettes  . Smokeless tobacco: Never Used  . Alcohol Use: Yes     rare  . Drug Use: No  . Sexually Active: Not on file   Other Topics Concern  . Not on file   Social History Narrative   Diabetic card given 05/03/2010.Financial assistance approved for 100% discount at [facility] and has [facility] card. 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    ROS: no fevers or chills, productive cough, hemoptysis, dysphasia, odynophagia, melena, hematochezia, dysuria, hematuria, rash, seizure activity, orthopnea, PND, pedal edema, claudication. Remaining systems are negative.  Physical Exam: Well-developed well-nourished in no acute distress.  Skin is warm and dry.  HEENT is normal.  Neck is supple. No thyromegaly.  Chest is clear to auscultation with normal expansion.  Cardiovascular exam is regular rate and rhythm.  Abdominal exam nontender or distended. No masses palpated. Extremities show no edema. neuro grossly intact  ECG sinus rhythm at a rate of 99. Left ventricular hypertrophy.

## 2011-01-04 NOTE — Assessment & Plan Note (Signed)
Patient counseled on discontinuing. 

## 2011-01-06 ENCOUNTER — Ambulatory Visit (HOSPITAL_COMMUNITY)
Admission: RE | Admit: 2011-01-06 | Discharge: 2011-01-06 | Disposition: A | Discharge status: Home / Self Care | Payer: Medicaid Other | Source: Ambulatory Visit | Attending: Cardiovascular Disease | Admitting: Cardiovascular Disease

## 2011-01-06 DIAGNOSIS — I428 Other cardiomyopathies: Secondary | ICD-10-CM

## 2011-01-06 DIAGNOSIS — D869 Sarcoidosis, unspecified: Secondary | ICD-10-CM | POA: Insufficient documentation

## 2011-01-06 LAB — GLUCOSE, CAPILLARY
Glucose-Capillary: 134 mg/dL — ABNORMAL HIGH (ref 70–99)
Glucose-Capillary: 123 mg/dL — ABNORMAL HIGH (ref 70–99)

## 2011-01-06 LAB — PREGNANCY, URINE: Preg Test, Ur: NEGATIVE

## 2011-01-11 ENCOUNTER — Other Ambulatory Visit: Payer: Medicaid Other | Admitting: *Deleted

## 2011-01-14 NOTE — Cardiovascular Report (Signed)
  NAMEWILLOWDEAN, Melody Gray NO.:  [id number]  MEDICAL RECORD NO.:  [id number]  LOCATION:  [facility]                         FACILITY:  [place]  PHYSICIAN:  Loralie Champagne, MD      DATE OF BIRTH:  December 22, 1970  DATE OF PROCEDURE:  01/06/2011 DATE OF DISCHARGE:  01/06/2011                           CARDIAC CATHETERIZATION   PROCEDURE: 1. Left heart catheterization. 2. Coronary angiography. 3. Left ventriculography.  INDICATIONS:  This is a 40 year old who has a history of sarcoidosis. She has had a recent fall in her EF and some congestive heart failure symptoms.  She is sent for evaluation in the setting of a new-onset cardiomyopathy to assess for any coronary disease.  PROCEDURE NOTE:  After informed consent was obtained, the patient underwent Allen's testing on her right wrist.  The right ulnar artery gave good collateral circulation to the radial side of the hand constituting a positive Allen's test.  The right radial artery was entered using modified Seldinger technique, and a 5-French arterial sheath was placed.  The patient received 3 mg of intraarterial verapamil and 4000 units of IV heparin.  The right coronary artery was engaged best using the JL-3.5 catheter.  The left coronary artery was engaged using the JL-3.5 catheter.  Left ventricle was entered using angled pigtail catheter.  There were no complications.  FINDINGS: 1. Hemodynamics, LV 91/15, aorta 90/58. 2. Left ventriculography.  EF was estimated to be about 40% with     diffuse hypokinesis. 3. The patient has a right dominant coronary system.  There was no     angiographic coronary artery disease.  IMPRESSION:  This is a 39 year old with a history of sarcoid who has a nonischemic cardiomyopathy.  EF is mild to moderately depressed by left ventriculogram.  There is no angiographic coronary disease.     Loralie Champagne, MD     DM/MEDQ  D:  01/06/2011  T:  01/06/2011  Job:  [id number]  cc:   Denice Bors. Stanford Breed, MD, [facility]  Electronically Signed by Loralie Champagne MD on 01/14/2011 11:42:15 PM

## 2011-01-18 ENCOUNTER — Encounter: Payer: Medicaid Other | Admitting: Internal Medicine

## 2011-01-18 NOTE — Progress Notes (Signed)
Subjective:     Patient ID: Melody Gray, female   DOB: 1970/09/12, 40 y.o.   MRN: [id number]  HPI  28 yobf  Smoker dx of sarcoidosis in 2001 = sob, cough became 02 dep in July 2011 last off prednsione March 2011 but daily since then and complicated by [facility]    January 06, 2010 ov  c/o increased SOB. Pt states outpt clinic advised her to follow up with Dr. Melvyn Novas for Sacoidosis. doe on 02 at 2 lpm sitting then 4 with activity gets off at the curb struggles with grocery store. Try taking Prednisone one half even days in am with breakfast  Be sure you take omeprazole Take one 30-60 min before first and last meals of the day  Stop nifedipine  Start Cardizem 240 mg once daily  Wear 24 hours per day, 2 lpm at rest and sleeping, 4lpm with activity, this is the best way to help your heart function better  CT Chest ( repeated with contrast)> no PE, just sarcoid changes   February 09, 2010 Followup sarcoid w/ PFTs. Breathing is the same- no better or worse.on 02 2 lpm w/in and then outside using 4lpm> cc doe x 50 ft still has to stop every aisle at [employer] but not [facility] parking. rec Start Dulera 2 puffs first thing in am and 2 puffs again in pm about 12 hours later and prednisone 10 mg every other day.   May 04, 2010 ov cc doe some better to point to where feels needs 02 less. rec  1) Stay on dulera 200 2 puffs first thing in am and 2 puffs again in pm about 12 hours later  2) Work on perfecting inhaler technique 3) Prednisone 20 mg 1/4 every other day  4) Stay on 02 2lpm 24 hours per day except increase to 4lpm with exercise  5) Weight control t thru ex    10/05/2010 ov/Melody Gray back smoking and using 02 most of the time at 2 lpm never up to 4 and prednisone 10mg   One half every other day. No cough. Sleeping ok without nocturnal  or early am exac of resp c/o's or need for noct saba. 1) Stay on dulera 200 2 puffs first thing in am and 2 puffs again in pm about 12 hours later  2) Work on perfecting  inhaler technique: relax and blow all the way out then take a nice smooth deep breath back in, triggering the inhaler at same time you start breathing  3) Prednisone 10  mg 1/2 every other day  4) Stay on 02 2lpm 24 hours per day except increase to 4lpm with exercise  5) Weight control t thru exercise on 02 4lpm 6) Please see patient coordinator before you leave today  to schedule repeat echo and we will call when it comes back for review   It is critical that you not smoke at all      01/18/2011 f/u ov/Melody Gray cc   Pt denies any significant sore throat, dysphagia, itching, sneezing,  nasal congestion or excess/ purulent secretions,  fever, chills, sweats, unintended wt loss, pleuritic or exertional cp, hempoptysis, orthopnea pnd or leg swelling.    Also denies any obvious fluctuation of symptoms with weather or environmental changes or other aggravating or alleviating factors.       Past Medical History:  Sarcoidosis (Dr Melody Gray)-w/ liver involvement per biopsy 12/09  - Reversible airway component so start [facility] 01/2010 > better  - HFa 75% p coaching  May 04, 2010  Unexplained Hypoxemia July 2011  - CT angiogram 01/07/10 >>> no PE  - PFT's February 09, 2010 FEV1 1.20 (49%) with ratio 72 and FRC 90%, FEV1  16% better p B2, DLC0 33% > corrects to 84  - 02 sats ok on 4lpm x rapid walk x 3 laps May 04, 2010 > Walked 3 laps @ 185 ft each stopped due to   Morbid obesity  - Target wt = 153 for BMI < 30  QT prolongation  Diabetes mellitus, type II  Hypertension  Heart catheterization 06-06-06 : No CAD, no RAS, normal EF  Hx eclampsia  Hx of scalp seborrheic dermatitis            Objective:   Physical Exam She is a cushingoid-appearing amb bf nad on 02 2lpm  wt 238 January 06, 2010 > 222 February 09, 2010 > 217 May 04, 2010 > 203 10/05/2010 > 01/18/2011  There is no stigmata liver disease  skin: anicteric  HEENT: normocephalic; PEERLA; no nasal or pharyngeal abnormalities  neck:  supple  nodes: no cervical lymphadenopathy  chest: clear to ausculatation and percussion  heart: no murmurs, gallops, or rubs  abd: soft, nontender; BS normoactive; no abdominal masses, tenderness, organomegaly; abdomen is obese  rectal: deferred  ext: no cynanosis, clubbing, edema      Assessment:          Plan:

## 2011-01-31 LAB — GLUCOSE, CAPILLARY: Glucose-Capillary: 110 — ABNORMAL HIGH

## 2011-02-02 ENCOUNTER — Encounter: Payer: Self-pay | Admitting: Internal Medicine

## 2011-02-02 ENCOUNTER — Ambulatory Visit (INDEPENDENT_AMBULATORY_CARE_PROVIDER_SITE_OTHER): Payer: Medicaid Other | Admitting: Internal Medicine

## 2011-02-02 DIAGNOSIS — I509 Heart failure, unspecified: Secondary | ICD-10-CM

## 2011-02-02 DIAGNOSIS — J9610 Chronic respiratory failure, unspecified whether with hypoxia or hypercapnia: Secondary | ICD-10-CM

## 2011-02-02 DIAGNOSIS — I2789 Other specified pulmonary heart diseases: Secondary | ICD-10-CM

## 2011-02-02 DIAGNOSIS — D869 Sarcoidosis, unspecified: Secondary | ICD-10-CM

## 2011-02-02 DIAGNOSIS — I2729 Other secondary pulmonary hypertension: Secondary | ICD-10-CM

## 2011-02-02 LAB — CBC
HCT: 35.2 % — ABNORMAL LOW (ref 36.0–46.0)
Hemoglobin: 11.8 g/dL — ABNORMAL LOW (ref 12.0–15.0)
MCHC: 33.5 g/dL (ref 30.0–36.0)
MCV: 92.3 fL (ref 78.0–100.0)
Platelets: 188 10*3/uL (ref 150–400)
RBC: 3.82 MIL/uL — ABNORMAL LOW (ref 3.87–5.11)
RDW: 17.2 % — ABNORMAL HIGH (ref 11.5–15.5)
WBC: 4.7 10*3/uL (ref 4.0–10.5)

## 2011-02-02 LAB — PROTIME-INR
INR: 1 (ref 0.00–1.49)
Prothrombin Time: 13.2 seconds (ref 11.6–15.2)

## 2011-02-02 LAB — APTT: aPTT: 28 seconds (ref 24–37)

## 2011-02-02 NOTE — Assessment & Plan Note (Signed)
The goal with a chronic steroid dependent illness is always arriving at the lowest effective dose that controls the disease/symptoms and not accepting a set "formula" which is based on statistics or guidelines that don't always take into account patient  variability or the natural hx of the dz in every individual patient, which may well vary over time.  For now therefore I recommend the patient maintain  A floor of 10mg  prednisone one half qod.

## 2011-02-02 NOTE — Patient Instructions (Signed)
Change your 02 to 2lpm at bedtime and with any activity outside your house  Please schedule a follow up visit in 3 months but call sooner if needed

## 2011-02-02 NOTE — Assessment & Plan Note (Signed)
Since does not have sign primary PAH ok to reduce 02 to use 2lpm with activity and sleeping but not 24 hours per day. Cautioned though if any sob or increase leg swelling should be using 24 hours per day

## 2011-02-02 NOTE — Progress Notes (Signed)
Subjective:     Patient ID: Melody Gray, female   DOB: 1971/03/17, 40 y.o.   MRN: [id number]  HPI  43 yobf  Smoker dx of sarcoidosis in 2001 = sob, cough became 02 dep in July 2011 last off prednsione March 2011 but daily since then and complicated by [facility]    January 06, 2010 ov  c/o increased SOB. Pt states outpt clinic advised her to follow up with Dr. Melvyn Novas for Sacoidosis. doe on 02 at 2 lpm sitting then 4 with activity gets off at the curb struggles with grocery store. Try taking Prednisone one half even days in am with breakfast  Be sure you take omeprazole Take one 30-60 min before first and last meals of the day  Stop nifedipine  Start Cardizem 240 mg once daily  Wear 24 hours per day, 2 lpm at rest and sleeping, 4lpm with activity, this is the best way to help your heart function better  CT Chest ( repeated with contrast)> no PE, just sarcoid changes   February 09, 2010 Followup sarcoid w/ PFTs. Breathing is the same- no better or worse.on 02 2 lpm w/in and then outside using 4lpm> cc doe x 50 ft still has to stop every aisle at [employer] but not [facility] parking. rec Start Dulera 2 puffs first thing in am and 2 puffs again in pm about 12 hours later and prednisone 10 mg every other day.   May 04, 2010 ov cc doe some better to point to where feels needs 02 less. rec  1) Stay on dulera 200 2 puffs first thing in am and 2 puffs again in pm about 12 hours later  2) Work on perfecting inhaler technique 3) Prednisone 20 mg 1/4 every other day  4) Stay on 02 2lpm 24 hours per day except increase to 4lpm with exercise  5) Weight control t thru ex    10/05/2010 ov/Rubin Dais back smoking and using 02 most of the time at 2 lpm never up to 4 and prednisone 10mg   One half every other day.  rec 1) Stay on dulera 200 2 puffs first thing in am and 2 puffs again in pm about 12 hours later  2) Work on perfecting inhaler technique: relax and blow all the way out then take a nice smooth deep breath back  in, triggering the inhaler at same time you start breathing  3) Prednisone 10  mg 1/2 every other day  4) Stay on 02 2lpm 24 hours per day except increase to 4lpm with exercise  5) Weight control   thru exercise on 02 4lpm 6) Please see patient coordinator before you leave today  to schedule repeat echo and we will call when it comes back for review   It is critical that you not smoke at all      02/02/2011 f/u ov/Melody Gray cc no change doe,  Minimal cough, now on ace and coreg. No excess mucus.  Sleeping ok without nocturnal  or early am exacerbation  of respiratory  c/o's or need for noct saba. Also denies any obvious fluctuation of symptoms with weather or environmental changes or other aggravating or alleviating factors except as outlined above    Pt denies any significant sore throat, dysphagia, itching, sneezing,  nasal congestion or excess/ purulent secretions,  fever, chills, sweats, unintended wt loss, pleuritic or exertional cp, hempoptysis, orthopnea pnd or leg swelling.           Past Medical History:  Sarcoidosis (  Dr Lenetta Piche)-w/ liver involvement per biopsy 12/09  - Reversible airway component so start [facility] 01/2010 > better  - HFa 75% p coaching May 04, 2010  Unexplained Hypoxemia July 2011  - CT angiogram 01/07/10 >>> no PE  - PFT's February 09, 2010 FEV1 1.20 (49%) with ratio 72 and FRC 90%, FEV1  16% better p B2, DLC0 33% > corrects to 84  - 02 sats ok on 4lpm x rapid walk x 3 laps May 04, 2010 > Walked 3 laps @ 185 ft each stopped due to   Morbid obesity  - Target wt = 153 for BMI < 30  QT prolongation  Diabetes mellitus, type II  Hypertension  Heart catheterization 06-06-06 : No CAD, no RAS, normal EF  Hx eclampsia  Hx of scalp seborrheic dermatitis            Objective:   Physical Exam She is a cushingoid-appearing amb bf nad on 02 2lpm  wt 238 January 06, 2010 > 222 February 09, 2010 > 217 May 04, 2010 > 203 10/05/2010 >  02/02/2011  209  There is no  stigmata liver disease  skin: anicteric  HEENT: normocephalic; PEERLA; no nasal or pharyngeal abnormalities  neck: supple  nodes: no cervical lymphadenopathy  chest: clear to ausculatation and percussion  heart: no murmurs, gallops, or rubs  abd: soft, nontender; BS normoactive; no abdominal masses, tenderness, organomegaly; abdomen is obese  rectal: deferred  ext: no cynanosis, clubbing, edema      Assessment:          Plan:

## 2011-02-02 NOTE — Assessment & Plan Note (Signed)
rx as predominant L Ht failure for now by most recent echo 10/14/10

## 2011-02-13 ENCOUNTER — Ambulatory Visit (HOSPITAL_COMMUNITY)
Admission: RE | Admit: 2011-02-13 | Discharge: 2011-02-13 | Disposition: A | Discharge status: Home / Self Care | Payer: Medicaid Other | Source: Ambulatory Visit | Attending: Internal Medicine | Admitting: Internal Medicine

## 2011-02-13 ENCOUNTER — Encounter: Payer: Medicaid Other | Admitting: Cardiology

## 2011-02-13 ENCOUNTER — Ambulatory Visit (INDEPENDENT_AMBULATORY_CARE_PROVIDER_SITE_OTHER): Payer: Medicaid Other | Admitting: Internal Medicine

## 2011-02-13 ENCOUNTER — Encounter: Payer: Self-pay | Admitting: *Deleted

## 2011-02-13 VITALS — BP 114/78 | HR 100 | Temp 97.5°F

## 2011-02-13 DIAGNOSIS — M79609 Pain in unspecified limb: Secondary | ICD-10-CM | POA: Insufficient documentation

## 2011-02-13 DIAGNOSIS — Z23 Encounter for immunization: Secondary | ICD-10-CM

## 2011-02-13 DIAGNOSIS — M79672 Pain in left foot: Secondary | ICD-10-CM | POA: Insufficient documentation

## 2011-02-13 DIAGNOSIS — IMO0001 Reserved for inherently not codable concepts without codable children: Secondary | ICD-10-CM

## 2011-02-13 DIAGNOSIS — D869 Sarcoidosis, unspecified: Secondary | ICD-10-CM

## 2011-02-13 DIAGNOSIS — M7730 Calcaneal spur, unspecified foot: Secondary | ICD-10-CM | POA: Insufficient documentation

## 2011-02-13 DIAGNOSIS — M7989 Other specified soft tissue disorders: Secondary | ICD-10-CM | POA: Insufficient documentation

## 2011-02-13 LAB — GLUCOSE, CAPILLARY: Glucose-Capillary: 124 mg/dL — ABNORMAL HIGH (ref 70–99)

## 2011-02-13 MED ORDER — PREDNISONE 20 MG PO TABS: ORAL_TABLET | ORAL | Status: DC

## 2011-02-13 NOTE — Assessment & Plan Note (Addendum)
Acute onset. Worrisome for stress  fracture since patient is on chronic prednisone. No recent bone scan in records. Xray of the foot showed Small plantar calcaneal spur. Osseous demineralization and diffuse soft tissue swelling.  No acute bony abnormalities. After further researching patient with sarcoidosis can develop in 10% muscle skeletal disease . Will increase home dose prednisone to 20 mg for 3 days, decrease to 10 mg for 3 days , 5 mg for 3 days and then restart home regimen per Dr Melvyn Novas with 5 mg every other day. Will reevaluate the patient in one week. Patient may have to be referred back to Dr Melvyn Novas for possible changes in management.

## 2011-02-13 NOTE — Progress Notes (Deleted)
  Subjective:    Patient ID: Melody Gray, female    DOB: 19-Feb-1971, 40 y.o.   MRN: [id number]  HPI    Review of Systems     Objective:   Physical Exam        Assessment & Plan:

## 2011-02-13 NOTE — Progress Notes (Unsigned)
Pt presents to front desk c/o L foot pain,swelling x 1 week. Denies injury. Cannot walk on foot. Denies any event that may have contributed to foot pain. appt given for 1115, dr Newt Lukes. Mamieh. informed

## 2011-02-13 NOTE — Progress Notes (Signed)
[id number] female I saw in July of 2012 for evaluation of abnormal echocardiogram. Cardiac catheterization in 2008 revealed no coronary artery disease, ejection fraction 50-55% and no renal artery stenosis. Echocardiogram in June of 2012 revealed an ejection fraction of 35% which was new. There was mild biatrial enlargement, mild right ventricular enlargement and mildly reduced RV function. Cardiac MRI in July of 2012 revealed Moderate to severe LV systolic dysfunction, EF 123456. The anterior and anterolateral walls appeared severely hypokinetic. Mild RV dilation with mild systolic dysfunction. Possible small areas of mid wall delayed enhancement in the apical septal and apical lateral walls. These areas were very faint and not definitely of clinical significance. Mid wall enhancement can be found in infiltrative cardiomyopathies such as that due to sarcoidosis. TSH and ferritin normal. Cardiac cath in Sept 2012 showed normal coronary arteries and EF 40.  I last saw her in Sept of 2012. Since then,    Current Outpatient Prescriptions  Medication Sig Dispense Refill  . carvedilol (COREG) 6.25 MG tablet Take 1 tablet (6.25 mg total) by mouth 2 (two) times daily.  60 tablet  11  . esomeprazole (NEXIUM) 20 MG capsule Take 20 mg by mouth daily before breakfast.        . ferrous sulfate 325 (65 FE) MG tablet Take 1 tablet (325 mg total) by mouth 2 (two) times daily.  60 tablet  5  . furosemide (LASIX) 40 MG tablet Take 1 tablet by mouth daily  30 tablet  3  . insulin NPH-insulin regular (NOVOLIN 70/30) (70-30) 100 UNIT/ML injection Inject 25 Units into the skin 2 (two) times daily before a meal.  50 mL  3  . Insulin Syringe-Needle U-100 28G X 1/2" 0.5 ML MISC by Does not apply route as directed.        . Lancets MISC by Does not apply route 3 (three) times daily.        Marland Kitchen lisinopril (PRINIVIL,ZESTRIL) 5 MG tablet Take 1 tablet (5 mg total) by mouth daily.  30 tablet  11  . metFORMIN (GLUCOPHAGE) 1000 MG  tablet Take 1 tablet (1,000 mg total) by mouth 2 (two) times daily with a meal.  60 tablet  6  . Mometasone Furo-Formoterol Fum (DULERA) 200-5 MCG/ACT AERO Inhale into the lungs. 2 puffs first thing  in am and 2 puffs again in pm about 12 hours later       . potassium chloride SA (K-DUR,KLOR-CON) 20 MEQ tablet Take 1 tablet (20 mEq total) by mouth daily.  30 tablet  12  . predniSONE (DELTASONE) 20 MG tablet Take 0.5 tablets (10 mg total) by mouth every other day. Please take 5mg  total every other day as discussed with Dr. Melvyn Novas  30 tablet  4     Past Medical History  Diagnosis Date  . Sarcoidosis     Followed by Dr. Melvyn Novas; w/ liver involvement per biopsy 12/09, Reversible airway component so started on [facility] 01/2010; HFA 75% p coaching 05/2010  . Hypoxemia     CT angiogram 9/11>> No PE; PFTs 10/11- FEV1 1.20 (49%) with 16% better p B2, DLCO 33%> corrects to 84; O2 sats ok on 4 lpm X rapid walk X 3 laps 05/2010  . Morbid obesity     Target wt= 153 for BMI <30  . QT prolongation   . Diabetes mellitus   . Hypertension   . Hx of cardiac cath 2/08    No CAD, no RAS,  normal EF  . Seborrheic dermatitis  of scalp   . 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 Hep B serology neg. but Hep C ab +, HIV neg. 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 Cardiomyopathy, nonischemic     EF 45% 12/10; Echo 7/11 normal EF, PAS 48  . Diabetic retinopathy     Right eye 2/11  . Health maintenance examination     Mammogram 05/2010 Negative; Last Pap smear 03/2008; Last DM eye exam 2/11> mild non-proliferative diabetic retinopathy. OD    Past Surgical History  Procedure Date  . Tubal ligation   . Breast surgery   . Cesarean section     History   Social History  . Marital Status: Single    Spouse Name: N/A    Number of Children: 2  . Years of Education: N/A   Occupational History  . 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 Smoking status: Former Smoker -- 0.5 packs/day for 20 years    Types: Cigarettes    Quit date: 01/19/2011  . Smokeless tobacco: Never Used  . Alcohol Use: Yes     rare  . Drug Use: No  . Sexually Active: Not on file   Other Topics Concern  . Not on file   Social History Narrative   Diabetic card given 05/03/2010.Financial assistance approved for 100% discount at [facility] and has [facility] card. 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    ROS: no fevers or chills, productive cough, hemoptysis, dysphasia, odynophagia, melena, hematochezia, dysuria, hematuria, rash, seizure activity, orthopnea, PND, pedal edema, claudication. Remaining systems are negative.  Physical Exam: Well-developed well-nourished in no acute distress.  Skin is warm and dry.  HEENT is normal.  Neck is supple. No thyromegaly.  Chest is clear to auscultation with normal expansion.  Cardiovascular exam is regular rate and rhythm.  Abdominal exam nontender or distended. No masses palpated. Extremities show no edema. neuro grossly intact      This encounter was created in error - please disregard.

## 2011-02-13 NOTE — Progress Notes (Signed)
Subjective:   Patient ID: Melody Gray female   DOB: Apr 08, 1971 40 y.o.   MRN: [id number]  HPI: [name] is a 40 y.o. female with PMH significant as outlined below who presented to the clinic for foot pain. Patient noted that one week ago she woke up and noted significant pain in her left foot. She was not able put any weight on her foot due to pain. She took extra strenght Tylenol which somewhat relieved the pain but over the weekend she noted significant worsening of her pain . The pain is described as throbbing , aching pain especially when standing on it. She is just able to mover her toes without any pain. Denies any trauma, injury, fevers or chills, increased exercise , increased walking.     Past Medical History  Diagnosis Date  . Sarcoidosis     Followed by Dr. Melvyn Novas; w/ liver involvement per biopsy 12/09, Reversible airway component so started on [facility] 01/2010; HFA 75% p coaching 05/2010  . Hypoxemia     CT angiogram 9/11>> No PE; PFTs 10/11- FEV1 1.20 (49%) with 16% better p B2, DLCO 33%> corrects to 84; O2 sats ok on 4 lpm X rapid walk X 3 laps 05/2010  . Morbid obesity     Target wt= 153 for BMI <30  . QT prolongation   . Diabetes mellitus   . Hypertension   . Hx of cardiac cath 2/08    No CAD, no RAS,  normal EF  . Seborrheic dermatitis of scalp   . 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 Hep B serology neg. but Hep C ab +, HIV neg. 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 Cardiomyopathy, nonischemic     EF 45% 12/10; Echo 7/11 normal EF, PAS 48  . Diabetic retinopathy     Right eye 2/11  . Health maintenance examination     Mammogram 05/2010 Negative; Last Pap smear 03/2008; Last DM eye exam 2/11> mild non-proliferative diabetic retinopathy. OD   Current Outpatient Prescriptions  Medication Sig Dispense Refill  . carvedilol (COREG) 6.25 MG tablet Take 1 tablet (6.25 mg total) by  mouth 2 (two) times daily.  60 tablet  11  . esomeprazole (NEXIUM) 20 MG capsule Take 20 mg by mouth daily before breakfast.        . ferrous sulfate 325 (65 FE) MG tablet Take 1 tablet (325 mg total) by mouth 2 (two) times daily.  60 tablet  5  . furosemide (LASIX) 40 MG tablet Take 1 tablet by mouth daily  30 tablet  3  . insulin NPH-insulin regular (NOVOLIN 70/30) (70-30) 100 UNIT/ML injection Inject 25 Units into the skin 2 (two) times daily before a meal.  50 mL  3  . Insulin Syringe-Needle U-100 28G X 1/2" 0.5 ML MISC by Does not apply route as directed.        . Lancets MISC by Does not apply route 3 (three) times daily.        Marland Kitchen lisinopril (PRINIVIL,ZESTRIL) 5 MG tablet Take 1 tablet (5 mg total) by mouth daily.  30 tablet  11  . metFORMIN (GLUCOPHAGE) 1000 MG tablet Take 1 tablet (1,000 mg total) by mouth 2 (two) times daily with a meal.  60 tablet  6  . Mometasone Furo-Formoterol Fum (DULERA) 200-5 MCG/ACT AERO Inhale into the lungs. 2 puffs first thing  in am and 2 puffs again in pm about 12 hours later       . potassium chloride SA (K-DUR,KLOR-CON) 20 MEQ tablet Take 1 tablet (20 mEq total) by mouth daily.  30 tablet  12  . predniSONE (DELTASONE) 20 MG tablet Take 0.5 tablets (10 mg total) by mouth every other day. Please take 5mg  total every other day as discussed with Dr. Melvyn Novas  30 tablet  4   Review of Systems: Constitutional: Denies fever, chills, diaphoresis, appetite change and fatigue.  Respiratory: Noted some SOB, DOE, cough, chest tightness,  and wheezing.   Cardiovascular: Denies chest pain, palpitations and leg swelling.  Gastrointestinal: Denies nausea, vomiting, abdominal pain, diarrhea, constipation, blood in stool and abdominal distention.  Genitourinary: Denies dysuria, urgency, frequency, hematuria, flank pain and difficulty urinating.  Skin: Denies pallor, rash and wound.  Neurological: Denies dizziness, seizures, syncope, weakness, light-headedness, numbness and  headaches.    Objective:  Physical Exam: Filed Vitals:   02/13/11 1119  BP: 114/78  Pulse: 100  Temp: 97.5 F (36.4 C)  TempSrc: Oral   Constitutional: Vital signs reviewed.  Patient is a well-developed and well-nourished in no acute distress and cooperative with exam. Alert and oriented x3.  Neck: Supple,  Cardiovascular: RRR, S1 normal, S2 normal, no MRG, pulses symmetric and intact bilaterally Pulmonary/Chest: wheezes throughout, No rales, or rhonchi Abdominal: Soft. Non-tender, non-distended, bowel sounds are normal, no masses, organomegaly, or guarding present.  GU: no CVA tenderness Musculoskeletal:left foot: swelling, decreased ROM due to pain, scaling noted. No erythema. No open lesion. Normal sensation. Decreased strength due to pain.  Neurological: A&O x3, no focal motor deficit, sensory intact to light touch bilaterally.

## 2011-02-17 ENCOUNTER — Encounter: Payer: Medicaid Other | Admitting: Obstetrics and Gynecology

## 2011-02-21 ENCOUNTER — Encounter: Payer: Self-pay | Admitting: Internal Medicine

## 2011-02-21 ENCOUNTER — Ambulatory Visit (INDEPENDENT_AMBULATORY_CARE_PROVIDER_SITE_OTHER): Payer: Medicaid Other | Admitting: Internal Medicine

## 2011-02-21 ENCOUNTER — Encounter: Payer: Self-pay | Admitting: Cardiology

## 2011-02-21 VITALS — BP 138/89 | HR 86 | Temp 98.0°F | Ht 61.0 in | Wt 207.3 lb

## 2011-02-21 DIAGNOSIS — M79672 Pain in left foot: Secondary | ICD-10-CM

## 2011-02-21 DIAGNOSIS — IMO0001 Reserved for inherently not codable concepts without codable children: Secondary | ICD-10-CM

## 2011-02-21 DIAGNOSIS — M79609 Pain in unspecified limb: Secondary | ICD-10-CM

## 2011-02-21 DIAGNOSIS — D869 Sarcoidosis, unspecified: Secondary | ICD-10-CM

## 2011-02-21 LAB — GLUCOSE, CAPILLARY: Glucose-Capillary: 175 mg/dL — ABNORMAL HIGH (ref 70–99)

## 2011-02-21 NOTE — Progress Notes (Signed)
Subjective:   Patient ID: Melody Gray female   DOB: 21-May-1970 40 y.o.   MRN: [id number]  HPI: [name] is a 40 y.o. female with PMH significant as outlined below who presented to the clinic for a follow up for left foot pain. Patient noted that the pain has significantly improved since  Starting the prednisone taper dosage. She is doing a lot better. Denies any fever or chills. She mentioned that she has occasionally some sharp pain in her foot and in her back ( recommended by Dr Dorthula Nettles to get Xray of the spine which she did not since the pain has improved.)     Past Medical History  Diagnosis Date  . Sarcoidosis     Followed by Dr. Melvyn Novas; w/ liver involvement per biopsy 12/09, Reversible airway component so started on [facility] 01/2010; HFA 75% p coaching 05/2010  . Hypoxemia     CT angiogram 9/11>> No PE; PFTs 10/11- FEV1 1.20 (49%) with 16% better p B2, DLCO 33%> corrects to 84; O2 sats ok on 4 lpm X rapid walk X 3 laps 05/2010  . Morbid obesity     Target wt= 153 for BMI <30  . QT prolongation   . Diabetes mellitus   . Hypertension   . Hx of cardiac cath 2/08    No CAD, no RAS,  normal EF  . Seborrheic dermatitis of scalp   . 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 Hep B serology neg. but Hep C ab +, HIV neg. 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 Cardiomyopathy, nonischemic     EF 45% 12/10; Echo 7/11 normal EF, PAS 48  . Diabetic retinopathy     Right eye 2/11  . Health maintenance examination     Mammogram 05/2010 Negative; Last Pap smear 03/2008; Last DM eye exam 2/11> mild non-proliferative diabetic retinopathy. OD   Current Outpatient Prescriptions  Medication Sig Dispense Refill  . carvedilol (COREG) 6.25 MG tablet Take 1 tablet (6.25 mg total) by mouth 2 (two) times daily.  60 tablet  11  . esomeprazole (NEXIUM) 20 MG capsule Take 20 mg by mouth daily before breakfast.         . ferrous sulfate 325 (65 FE) MG tablet Take 1 tablet (325 mg total) by mouth 2 (two) times daily.  60 tablet  5  . furosemide (LASIX) 40 MG tablet Take 1 tablet by mouth daily  30 tablet  3  . insulin NPH-insulin regular (NOVOLIN 70/30) (70-30) 100 UNIT/ML injection Inject 25 Units into the skin 2 (two) times daily before a meal.  50 mL  3  . Insulin Syringe-Needle U-100 28G X 1/2" 0.5 ML MISC by Does not apply route as directed.        . Lancets MISC by Does not apply route 3 (three) times daily.        Marland Kitchen lisinopril (PRINIVIL,ZESTRIL) 5 MG tablet Take 1 tablet (5 mg total) by mouth daily.  30 tablet  11  . metFORMIN (GLUCOPHAGE) 1000 MG tablet Take 1 tablet (1,000 mg total) by mouth 2 (two) times daily with a meal.  60 tablet  6  . Mometasone Furo-Formoterol Fum (DULERA) 200-5 MCG/ACT AERO Inhale into the lungs. 2 puffs first thing  in am and 2 puffs again in pm about 12 hours later       . potassium chloride SA (  K-DUR,KLOR-CON) 20 MEQ tablet Take 1 tablet (20 mEq total) by mouth daily.  30 tablet  12  . predniSONE (DELTASONE) 20 MG tablet Take 20 mg once a day for 3 days,  then take 10 mg for 3 days then take 5 mg for 3 days and then  take 5mg  total every other day as discussed with Dr. Melvyn Novas  30 tablet  4   Review of Systems: Constitutional: Denies fever, chills, diaphoresis, appetite change and fatigue.  Respiratory: Denies SOB, DOE, cough, chest tightness,  and wheezing.   Cardiovascular: Denies chest pain, palpitations and leg swelling.  Gastrointestinal: Denies nausea, vomiting, abdominal pain, diarrhea, constipation, blood in stool and abdominal distention.  Skin: Denies pallor, rash and wound.  Neurological: Denies dizziness, seizures, syncope, weakness, light-headedness, numbness and headaches.   Objective:  Physical Exam:  Constitutional: Vital signs reviewed.  Patient is a well-developed and well-nourished  in no acute distress and cooperative with exam. Alert and oriented x3.    Cardiovascular: RRR, S1 normal, S2 normal, no MRG, pulses symmetric and intact bilaterally Pulmonary/Chest: CTAB, no wheezes, rales, or rhonchi Abdominal: Soft. Non-tender, non-distended, bowel sounds are normal, no masses, organomegaly, or guarding present.  Musculoskeletal: No joint deformities, erythema, or stiffness, ROM full and no nontender Neurological: A&O x3, no focal motor deficit, sensory intact to light touch bilaterally.  Skin: Warm, dry and intact. No rash, cyanosis, or clubbing.

## 2011-02-21 NOTE — Assessment & Plan Note (Signed)
Will send patient for a Dexa scan since patient is chronic prednisone.  She will follow up with Dr Melvyn Novas in 2-3 month.

## 2011-02-21 NOTE — Progress Notes (Signed)
Pt aware of appt [facility] Bone Density 02/27/11 9:15AM. Hilda Blades Manu Rubey RN 02/21/11 11:20AM

## 2011-02-21 NOTE — Patient Instructions (Signed)
1. Please make sure you taper down the prednisone to 5 mg every other day as Dr Melvyn Novas recommended. 2. Please make sure to make a follow up appointment for December or January with Dr Melvyn Novas.

## 2011-02-21 NOTE — Assessment & Plan Note (Signed)
Improved. Likely due to Sarcoidosis flare.  Completing the prednisone taper and will resume maintenance dosage of 5 mg every other day.

## 2011-02-27 ENCOUNTER — Ambulatory Visit (HOSPITAL_COMMUNITY): Admission: RE | Admit: 2011-02-27 | Payer: Medicaid Other | Source: Ambulatory Visit

## 2011-03-20 ENCOUNTER — Encounter: Payer: Self-pay | Admitting: Cardiology

## 2011-03-22 ENCOUNTER — Other Ambulatory Visit: Payer: Self-pay | Admitting: *Deleted

## 2011-03-22 DIAGNOSIS — IMO0001 Reserved for inherently not codable concepts without codable children: Secondary | ICD-10-CM

## 2011-03-22 MED ORDER — INSULIN NPH ISOPHANE & REGULAR (70-30) 100 UNIT/ML ~~LOC~~ SUSP: 25.0000 [IU] | Freq: Two times a day (BID) | SUBCUTANEOUS | Status: DC

## 2011-03-22 NOTE — Telephone Encounter (Signed)
Novolin 70/30 rx called to [place].

## 2011-04-05 ENCOUNTER — Encounter: Payer: Medicaid Other | Admitting: Obstetrics and Gynecology

## 2011-04-11 ENCOUNTER — Encounter: Payer: Medicaid Other | Admitting: Internal Medicine

## 2011-04-11 ENCOUNTER — Telehealth: Payer: Self-pay | Admitting: *Deleted

## 2011-04-11 NOTE — Telephone Encounter (Signed)
Pt is requesting a Proventil Inhaler HFA 7 GM Inhale 2 puffs by mouth 4 times a day as needed.  Goes to the [facility].  Sander Nephew, RN 04/11/2011 10:36 AM

## 2011-04-26 ENCOUNTER — Encounter: Payer: Self-pay | Admitting: Cardiology

## 2011-05-02 DIAGNOSIS — R768 Other specified abnormal immunological findings in serum: Secondary | ICD-10-CM

## 2011-05-02 HISTORY — DX: Other specified abnormal immunological findings in serum: R76.8

## 2011-05-05 ENCOUNTER — Encounter: Payer: Medicaid Other | Admitting: Internal Medicine

## 2011-05-08 ENCOUNTER — Other Ambulatory Visit: Payer: Self-pay | Admitting: *Deleted

## 2011-05-08 DIAGNOSIS — K219 Gastro-esophageal reflux disease without esophagitis: Secondary | ICD-10-CM

## 2011-05-08 MED ORDER — ESOMEPRAZOLE MAGNESIUM 20 MG PO CPDR: 20.0000 mg | DELAYED_RELEASE_CAPSULE | Freq: Every day | ORAL | Status: DC

## 2011-05-09 ENCOUNTER — Encounter (HOSPITAL_COMMUNITY): Payer: Self-pay | Admitting: Emergency Medicine

## 2011-05-09 ENCOUNTER — Inpatient Hospital Stay (HOSPITAL_COMMUNITY)
Admission: EM | Admit: 2011-05-09 | Discharge: 2011-05-11 | DRG: 683 | Disposition: A | Discharge status: Home / Self Care | Payer: Medicaid Other | Source: Ambulatory Visit | Attending: Internal Medicine | Admitting: Internal Medicine

## 2011-05-09 DIAGNOSIS — D869 Sarcoidosis, unspecified: Secondary | ICD-10-CM

## 2011-05-09 DIAGNOSIS — I509 Heart failure, unspecified: Secondary | ICD-10-CM

## 2011-05-09 DIAGNOSIS — N189 Chronic kidney disease, unspecified: Secondary | ICD-10-CM | POA: Diagnosis present

## 2011-05-09 DIAGNOSIS — I498 Other specified cardiac arrhythmias: Secondary | ICD-10-CM | POA: Diagnosis present

## 2011-05-09 DIAGNOSIS — K219 Gastro-esophageal reflux disease without esophagitis: Secondary | ICD-10-CM

## 2011-05-09 DIAGNOSIS — E119 Type 2 diabetes mellitus without complications: Secondary | ICD-10-CM | POA: Diagnosis present

## 2011-05-09 DIAGNOSIS — R1013 Epigastric pain: Secondary | ICD-10-CM | POA: Diagnosis present

## 2011-05-09 DIAGNOSIS — R160 Hepatomegaly, not elsewhere classified: Secondary | ICD-10-CM | POA: Diagnosis present

## 2011-05-09 DIAGNOSIS — F17200 Nicotine dependence, unspecified, uncomplicated: Secondary | ICD-10-CM

## 2011-05-09 DIAGNOSIS — E876 Hypokalemia: Secondary | ICD-10-CM | POA: Diagnosis present

## 2011-05-09 DIAGNOSIS — E1139 Type 2 diabetes mellitus with other diabetic ophthalmic complication: Secondary | ICD-10-CM

## 2011-05-09 DIAGNOSIS — Z794 Long term (current) use of insulin: Secondary | ICD-10-CM

## 2011-05-09 DIAGNOSIS — I428 Other cardiomyopathies: Secondary | ICD-10-CM | POA: Diagnosis present

## 2011-05-09 DIAGNOSIS — J99 Respiratory disorders in diseases classified elsewhere: Secondary | ICD-10-CM | POA: Diagnosis present

## 2011-05-09 DIAGNOSIS — E871 Hypo-osmolality and hyponatremia: Secondary | ICD-10-CM | POA: Diagnosis present

## 2011-05-09 DIAGNOSIS — IMO0002 Reserved for concepts with insufficient information to code with codable children: Secondary | ICD-10-CM

## 2011-05-09 DIAGNOSIS — I5032 Chronic diastolic (congestive) heart failure: Secondary | ICD-10-CM | POA: Diagnosis present

## 2011-05-09 DIAGNOSIS — E869 Volume depletion, unspecified: Secondary | ICD-10-CM

## 2011-05-09 DIAGNOSIS — E11319 Type 2 diabetes mellitus with unspecified diabetic retinopathy without macular edema: Secondary | ICD-10-CM | POA: Diagnosis present

## 2011-05-09 DIAGNOSIS — IMO0001 Reserved for inherently not codable concepts without codable children: Secondary | ICD-10-CM

## 2011-05-09 DIAGNOSIS — J9612 Chronic respiratory failure with hypercapnia: Secondary | ICD-10-CM | POA: Diagnosis present

## 2011-05-09 DIAGNOSIS — A498 Other bacterial infections of unspecified site: Secondary | ICD-10-CM | POA: Diagnosis present

## 2011-05-09 DIAGNOSIS — Z79899 Other long term (current) drug therapy: Secondary | ICD-10-CM

## 2011-05-09 DIAGNOSIS — R509 Fever, unspecified: Secondary | ICD-10-CM

## 2011-05-09 DIAGNOSIS — D631 Anemia in chronic kidney disease: Secondary | ICD-10-CM | POA: Diagnosis present

## 2011-05-09 DIAGNOSIS — Z6838 Body mass index (BMI) 38.0-38.9, adult: Secondary | ICD-10-CM

## 2011-05-09 DIAGNOSIS — R6510 Systemic inflammatory response syndrome (SIRS) of non-infectious origin without acute organ dysfunction: Secondary | ICD-10-CM | POA: Diagnosis present

## 2011-05-09 DIAGNOSIS — D509 Iron deficiency anemia, unspecified: Secondary | ICD-10-CM | POA: Diagnosis present

## 2011-05-09 DIAGNOSIS — I10 Essential (primary) hypertension: Secondary | ICD-10-CM | POA: Diagnosis present

## 2011-05-09 DIAGNOSIS — R112 Nausea with vomiting, unspecified: Secondary | ICD-10-CM | POA: Diagnosis present

## 2011-05-09 DIAGNOSIS — D72829 Elevated white blood cell count, unspecified: Secondary | ICD-10-CM

## 2011-05-09 DIAGNOSIS — J9610 Chronic respiratory failure, unspecified whether with hypoxia or hypercapnia: Secondary | ICD-10-CM | POA: Diagnosis present

## 2011-05-09 DIAGNOSIS — K3189 Other diseases of stomach and duodenum: Secondary | ICD-10-CM | POA: Diagnosis present

## 2011-05-09 DIAGNOSIS — N390 Urinary tract infection, site not specified: Secondary | ICD-10-CM | POA: Diagnosis present

## 2011-05-09 DIAGNOSIS — E2749 Other adrenocortical insufficiency: Secondary | ICD-10-CM | POA: Diagnosis present

## 2011-05-09 DIAGNOSIS — N179 Acute kidney failure, unspecified: Secondary | ICD-10-CM

## 2011-05-09 DIAGNOSIS — K8020 Calculus of gallbladder without cholecystitis without obstruction: Secondary | ICD-10-CM

## 2011-05-09 LAB — DIFFERENTIAL
Basophils Absolute: 0 10*3/uL (ref 0.0–0.1)
Basophils Relative: 0 % (ref 0–1)
Eosinophils Absolute: 0.2 10*3/uL (ref 0.0–0.7)
Eosinophils Relative: 1 % (ref 0–5)
Lymphocytes Relative: 14 % (ref 12–46)
Lymphs Abs: 1.9 10*3/uL (ref 0.7–4.0)
Monocytes Absolute: 2 10*3/uL — ABNORMAL HIGH (ref 0.1–1.0)
Monocytes Relative: 15 % — ABNORMAL HIGH (ref 3–12)
Neutro Abs: 9.9 10*3/uL — ABNORMAL HIGH (ref 1.7–7.7)
Neutrophils Relative %: 71 % (ref 43–77)

## 2011-05-09 LAB — BASIC METABOLIC PANEL
BUN: 21 mg/dL (ref 6–23)
CO2: 25 mEq/L (ref 19–32)
Calcium: 9.6 mg/dL (ref 8.4–10.5)
Chloride: 96 mEq/L (ref 96–112)
Creatinine, Ser: 1.29 mg/dL — ABNORMAL HIGH (ref 0.50–1.10)
GFR calc Af Amer: 58 mL/min — ABNORMAL LOW (ref >90)
GFR calc non Af Amer: 50 mL/min — ABNORMAL LOW (ref >90)
Glucose, Bld: 134 mg/dL — ABNORMAL HIGH (ref 70–99)
Potassium: 4.2 mEq/L (ref 3.5–5.1)
Sodium: 133 mEq/L — ABNORMAL LOW (ref 135–145)

## 2011-05-09 LAB — CBC
HCT: 34 % — ABNORMAL LOW (ref 36.0–46.0)
Hemoglobin: 11.4 g/dL — ABNORMAL LOW (ref 12.0–15.0)
MCH: 30 pg (ref 26.0–34.0)
MCHC: 33.5 g/dL (ref 30.0–36.0)
MCV: 89.5 fL (ref 78.0–100.0)
Platelets: 153 10*3/uL (ref 150–400)
RBC: 3.8 MIL/uL — ABNORMAL LOW (ref 3.87–5.11)
RDW: 13.9 % (ref 11.5–15.5)
WBC: 14 10*3/uL — ABNORMAL HIGH (ref 4.0–10.5)

## 2011-05-09 LAB — URINALYSIS, ROUTINE W REFLEX MICROSCOPIC
Bilirubin Urine: NEGATIVE
Glucose, UA: NEGATIVE mg/dL
Ketones, ur: NEGATIVE mg/dL
Nitrite: NEGATIVE
Protein, ur: NEGATIVE mg/dL
Specific Gravity, Urine: 1.008 (ref 1.005–1.030)
Urobilinogen, UA: 1 mg/dL (ref 0.0–1.0)
pH: 5 (ref 5.0–8.0)

## 2011-05-09 LAB — URINE MICROSCOPIC-ADD ON

## 2011-05-09 LAB — CULTURE, BLOOD (ROUTINE X 2)
Culture  Setup Time: 201301090348
Culture: NO GROWTH

## 2011-05-09 LAB — POCT PREGNANCY, URINE: Preg Test, Ur: NEGATIVE

## 2011-05-09 MED ORDER — PIPERACILLIN-TAZOBACTAM 3.375 G IVPB
3.3750 g | Freq: Once | INTRAVENOUS | Status: AC
  Administered 2011-05-10: 3.375 g via INTRAVENOUS
  Filled 2011-05-09: qty 50

## 2011-05-09 MED ORDER — ONDANSETRON HCL 4 MG/2ML IJ SOLN
4.0000 mg | Freq: Once | INTRAMUSCULAR | Status: AC
  Administered 2011-05-10: 4 mg via INTRAVENOUS
  Filled 2011-05-09: qty 2

## 2011-05-09 MED ORDER — SODIUM CHLORIDE 0.9 % IV BOLUS (SEPSIS)
1000.0000 mL | Freq: Once | INTRAVENOUS | Status: AC
  Administered 2011-05-10: 1000 mL via INTRAVENOUS

## 2011-05-09 MED ORDER — IBUPROFEN 200 MG PO TABS
600.0000 mg | ORAL_TABLET | Freq: Once | ORAL | Status: AC
  Administered 2011-05-10: 600 mg via ORAL

## 2011-05-09 MED ORDER — MORPHINE SULFATE 4 MG/ML IJ SOLN
8.0000 mg | Freq: Once | INTRAMUSCULAR | Status: AC
  Administered 2011-05-10: 8 mg via INTRAVENOUS
  Filled 2011-05-09: qty 1

## 2011-05-09 NOTE — ED Provider Notes (Signed)
History     CSN: [id number]  Arrival date & time 05/09/11  2028   First MD Initiated Contact with Patient 05/09/11 2304      Chief Complaint  Patient presents with  . Abdominal Pain    HPI The patient reports approximately 24 hours of right-sided abdominal pain with associated nausea and diarrhea and chills.  She reports 2 episodes of nonbloody nonbilious vomiting.  She's had no appetite.  She reports fever with chills.  She was noted to have a fever of 101.3 on arrival to the emergency department.  She has a history of diabetes.  She's been told before that she has gallstones but has never had a cholecystectomy.  She reports she's concerned this pain could be coming from her gallbladder is  similar to her prior gallbladder pain.  Her pain is worsened by movement and palpation.  Her pain is improved by nothing.  Her symptoms are constant.  Her pain is moderate to severe in severity   Past Medical History  Diagnosis Date  . Sarcoidosis     Followed by Dr. Melvyn Novas; w/ liver involvement per biopsy 12/09, Reversible airway component so started on [facility] 01/2010; HFA 75% p coaching 05/2010  . Hypoxemia     CT angiogram 9/11>> No PE; PFTs 10/11- FEV1 1.20 (49%) with 16% better p B2, DLCO 33%> corrects to 84; O2 sats ok on 4 lpm X rapid walk X 3 laps 05/2010  . Morbid obesity     Target wt= 153 for BMI <30  . QT prolongation   . Diabetes mellitus   . Hypertension   . Hx of cardiac cath 2/08    No CAD, no RAS,  normal EF  . Seborrheic dermatitis of scalp   . 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 Hep B serology neg. but Hep C ab +, HIV neg. 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 Cardiomyopathy, nonischemic     EF 45% 12/10; Echo 7/11 normal EF, PAS 48  . Diabetic retinopathy     Right eye 2/11  . Health maintenance examination     Mammogram 05/2010 Negative; Last Pap smear 03/2008; Last DM eye exam 2/11> mild  non-proliferative diabetic retinopathy. OD    Past Surgical History  Procedure Date  . Tubal ligation   . Breast surgery   . Cesarean section     Family History  Problem Relation Age of Onset  . Cancer Mother     colon cancer  . Multiple sclerosis Father   . Asthma Sister     in childhood    History  Substance Use Topics  . Smoking status: Former Smoker -- 0.5 packs/day for 20 years    Types: Cigarettes    Quit date: 01/19/2011  . Smokeless tobacco: Never Used  . Alcohol Use: Yes     rare    OB History    Grav Para Term Preterm Abortions TAB SAB Ect Mult Living                  Review of Systems  All other systems reviewed and are negative.    Allergies  Vicodin  Home Medications   Current Outpatient Rx  Name Route Sig Dispense Refill  . CARVEDILOL 6.25 MG PO TABS Oral Take 6.25 mg by mouth 2 (two) times daily.      Marland Kitchen FERROUS SULFATE 325 (65  FE) MG PO TABS Oral Take 325 mg by mouth 2 (two) times daily.      . FUROSEMIDE 40 MG PO TABS Oral Take 40 mg by mouth daily. Take 1 tablet by mouth daily     . INSULIN ISOPHANE & REGULAR (70-30) 100 UNIT/ML [place] SUSP Subcutaneous Inject 25 Units into the skin 2 (two) times daily before a meal.      . LISINOPRIL 5 MG PO TABS Oral Take 5 mg by mouth daily.      Marland Kitchen METFORMIN HCL 1000 MG PO TABS Oral Take 1,000 mg by mouth 2 (two) times daily with a meal.      . MOMETASONE FURO-FORMOTEROL FUM 200-5 MCG/ACT IN AERO Inhalation Inhale 2 puffs into the lungs 2 (two) times daily.     Marland Kitchen POTASSIUM CHLORIDE CRYS ER 20 MEQ PO TBCR Oral Take 1 tablet (20 mEq total) by mouth daily. 30 tablet 12  . PREDNISONE 20 MG PO TABS Oral Take 5 mg by mouth every other day. Take 20 mg once a day for 3 days,  then take 10 mg for 3 days then take 5 mg for 3 days and then  take 5mg  total every other day (on odd days) as discussed with Dr. Melvyn Novas.       BP 93/51  Pulse 105  Temp(Src) 101.3 F (38.5 C) (Oral)  Resp 24  SpO2 99%  LMP  04/06/2011  Physical Exam  Nursing note and vitals reviewed. Constitutional: She is oriented to person, place, and time. She appears well-developed and well-nourished. No distress.       Febrile tachycardic and hypotensive.  HENT:  Head: Normocephalic and atraumatic.  Eyes: EOM are normal.  Neck: Normal range of motion.  Cardiovascular: Normal rate, regular rhythm and normal heart sounds.   Pulmonary/Chest: Effort normal and breath sounds normal.  Abdominal: Soft. She exhibits no distension.       Tenderness in the right upper quadrant without guarding or rebound.  Mild epigastric tenderness as well.  No tenderness in the right lower quadrant.  No tenderness at McBurney's point.  Musculoskeletal: Normal range of motion.  Neurological: She is alert and oriented to person, place, and time.  Skin: Skin is warm and dry.  Psychiatric: She has a normal mood and affect. Judgment normal.    ED Course  Procedures (including critical care time)  Labs Reviewed  CBC - Abnormal; Notable for the following:    WBC 14.0 (*)    RBC 3.80 (*)    Hemoglobin 11.4 (*)    HCT 34.0 (*)    All other components within normal limits  DIFFERENTIAL - Abnormal; Notable for the following:    Neutro Abs 9.9 (*)    Monocytes Relative 15 (*)    Monocytes Absolute 2.0 (*)    All other components within normal limits  BASIC METABOLIC PANEL - Abnormal; Notable for the following:    Sodium 133 (*)    Glucose, Bld 134 (*)    Creatinine, Ser 1.29 (*)    GFR calc non Af Amer 50 (*)    GFR calc Af Amer 58 (*)    All other components within normal limits  URINALYSIS, ROUTINE W REFLEX MICROSCOPIC - Abnormal; Notable for the following:    APPearance CLOUDY (*)    Hgb urine dipstick LARGE (*)    Leukocytes, UA SMALL (*)    All other components within normal limits  URINE MICROSCOPIC-ADD ON - Abnormal; Notable for the following:  Squamous Epithelial / LPF FEW (*)    All other components within normal limits   LIPASE, BLOOD - Abnormal; Notable for the following:    Lipase 89 (*)    All other components within normal limits  HEPATIC FUNCTION PANEL - Abnormal; Notable for the following:    Albumin 3.1 (*)    Alkaline Phosphatase 148 (*)    All other components within normal limits  POCT PREGNANCY, URINE  LACTIC ACID, PLASMA  POCT PREGNANCY, URINE  CULTURE, BLOOD (ROUTINE X 2)  CULTURE, BLOOD (ROUTINE X 2)  URINE CULTURE  URINE CULTURE   US Abdomen Complete  05/10/2011  *RADIOLOGY REPORT*  Clinical Data:  Right upper quadrant pain.  Gallstones.  COMPLETE ABDOMINAL ULTRASOUND  Comparison:  08/08/2008.  11/18/2009.  Findings:  Gallbladder:  Cholelithiasis is present.  No sonographic Murphy's sign.  No wall thickening.  Gallbladder wall is echogenic and there may be calcification.  Posterior acoustic shadowing is present.  Common bile duct:  4 mm, normal.  Liver:  Hepatomegaly and hepatic cirrhosis is present.  No focal mass lesion.  IVC:  Appears normal.  Pancreas:  Pancreas grossly within normal limits.  Question mildly enlarged lymph node near the porta hepatis and pancreatic neck.  Spleen:  Splenomegaly with splenic volume of 1128 ml.  16 cm splenic span.  Right Kidney:  12.2 cm. Normal echotexture.  Normal central sinus echo complex.  No calculi or hydronephrosis.  Left Kidney:  10.9 cm. Normal echotexture.  Normal central sinus echo complex.  No calculi or hydronephrosis.  Abdominal aorta:  No aneurysm identified.  IMPRESSION: 1.  Echogenic gallbladder wall, likely representing dependently layering gallstones.  Calcification of the gallbladder wall is possible as well.  No mass lesion.  No findings of acute cholecystitis. 2.  Hepatic splenomegaly and nodular contour of the liver suggesting hepatic cirrhosis.  Previously seen liver lesion is not identified on today's examination. 3.  Possible small lymph node anterior to the pancreatic neck. This is nonspecific and not uncommonly seen in the setting of  cirrhosis.  Original Report Authenticated By: Dereck Ligas, M.D.   i personally reviewed the [name]  No diagnosis found.    MDM  Presentation concerning for cholecystitis.  We'll obtain labs and an ultrasound.  She has a temp of 101.3 pulse [id number] and her systolic blood pressure on my valuation was 87.  Lactate was obtained.  She'll be given 2 L IV fluids.  She's been started on IV Zosyn at this time.  We'll continue monitor her blood pressure and heart rate closely as this may be developing sepsis  4:54 AM The patient is feeling better.  Her hypokalemia will be treated.  Her lactate is normal.  I still have significant concern for sending patient home at present with a blood pressure in the 70s and now is only 91 after 2 L of normal saline. Will admit to [facility] for observation.         Hoy Morn, MD 05/10/11 [phone]

## 2011-05-09 NOTE — ED Notes (Signed)
PT. REPORTS RIGHT ABDOMINAL PAIN WITH NAUSEA , DIARRHEA AND CHILLS ONSET YESTERDAY.

## 2011-05-10 ENCOUNTER — Encounter (HOSPITAL_COMMUNITY): Payer: Self-pay | Admitting: Internal Medicine

## 2011-05-10 ENCOUNTER — Emergency Department (HOSPITAL_COMMUNITY): Payer: Medicaid Other

## 2011-05-10 ENCOUNTER — Encounter: Payer: Medicaid Other | Admitting: Obstetrics & Gynecology

## 2011-05-10 DIAGNOSIS — IMO0002 Reserved for concepts with insufficient information to code with codable children: Secondary | ICD-10-CM

## 2011-05-10 DIAGNOSIS — N179 Acute kidney failure, unspecified: Secondary | ICD-10-CM

## 2011-05-10 DIAGNOSIS — E1165 Type 2 diabetes mellitus with hyperglycemia: Secondary | ICD-10-CM

## 2011-05-10 DIAGNOSIS — E869 Volume depletion, unspecified: Secondary | ICD-10-CM | POA: Diagnosis present

## 2011-05-10 HISTORY — DX: Acute kidney failure, unspecified: N17.9

## 2011-05-10 LAB — HEPATIC FUNCTION PANEL
ALT: 22 U/L (ref 0–35)
AST: 23 U/L (ref 0–37)
Albumin: 3.1 g/dL — ABNORMAL LOW (ref 3.5–5.2)
Alkaline Phosphatase: 148 U/L — ABNORMAL HIGH (ref 39–117)
Bilirubin, Direct: 0.2 mg/dL (ref 0.0–0.3)
Indirect Bilirubin: 0.4 mg/dL (ref 0.3–0.9)
Total Bilirubin: 0.6 mg/dL (ref 0.3–1.2)
Total Protein: 8.2 g/dL (ref 6.0–8.3)

## 2011-05-10 LAB — BASIC METABOLIC PANEL
BUN: 25 mg/dL — ABNORMAL HIGH (ref 6–23)
CO2: 24 mEq/L (ref 19–32)
Calcium: 8 mg/dL — ABNORMAL LOW (ref 8.4–10.5)
Chloride: 102 mEq/L (ref 96–112)
Creatinine, Ser: 2.16 mg/dL — ABNORMAL HIGH (ref 0.50–1.10)
GFR calc Af Amer: 31 mL/min — ABNORMAL LOW (ref >90)
GFR calc non Af Amer: 27 mL/min — ABNORMAL LOW (ref >90)
Glucose, Bld: 166 mg/dL — ABNORMAL HIGH (ref 70–99)
Potassium: 4.8 mEq/L (ref 3.5–5.1)
Sodium: 136 mEq/L (ref 135–145)

## 2011-05-10 LAB — URINE CULTURE
Colony Count: >=100000
Culture  Setup Time: 201301090121

## 2011-05-10 LAB — GLUCOSE, CAPILLARY
Glucose-Capillary: 109 mg/dL — ABNORMAL HIGH (ref 70–99)
Glucose-Capillary: 180 mg/dL — ABNORMAL HIGH (ref 70–99)
Glucose-Capillary: 182 mg/dL — ABNORMAL HIGH (ref 70–99)

## 2011-05-10 LAB — CULTURE, BLOOD (ROUTINE X 2)
Culture  Setup Time: 201301090348
Culture: NO GROWTH

## 2011-05-10 LAB — HEMOGLOBIN A1C
Hgb A1c MFr Bld: 8 % — ABNORMAL HIGH (ref <5.7)
Mean Plasma Glucose: 183 mg/dL — ABNORMAL HIGH (ref <117)

## 2011-05-10 LAB — CORTISOL-AM, BLOOD: Cortisol - AM: 11.2 ug/dL (ref 4.3–22.4)

## 2011-05-10 LAB — LACTIC ACID, PLASMA: Lactic Acid, Venous: 1.1 mmol/L (ref 0.5–2.2)

## 2011-05-10 LAB — LIPASE, BLOOD: Lipase: 89 U/L — ABNORMAL HIGH (ref 11–59)

## 2011-05-10 MED ORDER — HEPARIN SODIUM (PORCINE) 5000 UNIT/ML IJ SOLN
5000.0000 [IU] | Freq: Three times a day (TID) | INTRAMUSCULAR | Status: DC
  Administered 2011-05-10 – 2011-05-11 (×4): 5000 [IU] via SUBCUTANEOUS
  Filled 2011-05-10 (×9): qty 1

## 2011-05-10 MED ORDER — INSULIN ASPART 100 UNIT/ML ~~LOC~~ SOLN
0.0000 [IU] | Freq: Three times a day (TID) | SUBCUTANEOUS | Status: DC
  Administered 2011-05-10 – 2011-05-11 (×3): 2 [IU] via SUBCUTANEOUS
  Administered 2011-05-11: 3 [IU] via SUBCUTANEOUS
  Filled 2011-05-10: qty 3

## 2011-05-10 MED ORDER — POTASSIUM CHLORIDE 20 MEQ/15ML (10%) PO LIQD
ORAL | Status: AC
  Filled 2011-05-10: qty 30

## 2011-05-10 MED ORDER — PREDNISONE 5 MG PO TABS
7.5000 mg | ORAL_TABLET | Freq: Every day | ORAL | Status: DC
  Filled 2011-05-10 (×2): qty 1

## 2011-05-10 MED ORDER — SODIUM CHLORIDE 0.9 % IV BOLUS (SEPSIS)
1000.0000 mL | Freq: Once | INTRAVENOUS | Status: AC
  Administered 2011-05-10: 1000 mL via INTRAVENOUS

## 2011-05-10 MED ORDER — PREDNISONE 5 MG PO TABS: 7.5000 mg | ORAL_TABLET | Freq: Every day | ORAL | Status: DC

## 2011-05-10 MED ORDER — PREDNISONE 5 MG PO TABS
7.5000 mg | ORAL_TABLET | Freq: Every day | ORAL | Status: DC
  Administered 2011-05-10 – 2011-05-11 (×2): 7.5 mg via ORAL
  Filled 2011-05-10 (×3): qty 1

## 2011-05-10 MED ORDER — PREDNISONE 5 MG PO TABS
5.0000 mg | ORAL_TABLET | ORAL | Status: DC
  Administered 2011-05-10: 5 mg via ORAL
  Filled 2011-05-10: qty 1

## 2011-05-10 MED ORDER — ACETAMINOPHEN 325 MG PO TABS: 650.0000 mg | ORAL_TABLET | Freq: Four times a day (QID) | ORAL | Status: DC | PRN

## 2011-05-10 MED ORDER — PREDNISONE 5 MG PO TABS: 5.0000 mg | ORAL_TABLET | ORAL | Status: DC

## 2011-05-10 MED ORDER — ONDANSETRON HCL 4 MG/2ML IJ SOLN: 4.0000 mg | Freq: Four times a day (QID) | INTRAMUSCULAR | Status: DC | PRN

## 2011-05-10 MED ORDER — METFORMIN HCL 500 MG PO TABS
1000.0000 mg | ORAL_TABLET | Freq: Two times a day (BID) | ORAL | Status: DC
  Administered 2011-05-10 – 2011-05-11 (×3): 1000 mg via ORAL
  Filled 2011-05-10 (×5): qty 2

## 2011-05-10 MED ORDER — METFORMIN HCL 500 MG PO TABS: 1000.0000 mg | ORAL_TABLET | Freq: Two times a day (BID) | ORAL | Status: DC

## 2011-05-10 MED ORDER — MOMETASONE FURO-FORMOTEROL FUM 200-5 MCG/ACT IN AERO
2.0000 | INHALATION_SPRAY | Freq: Two times a day (BID) | RESPIRATORY_TRACT | Status: DC
  Administered 2011-05-10: 2 via RESPIRATORY_TRACT

## 2011-05-10 MED ORDER — SODIUM CHLORIDE 0.9 % IV SOLN
INTRAVENOUS | Status: AC
  Administered 2011-05-10: 10:00:00 via INTRAVENOUS

## 2011-05-10 MED ORDER — SODIUM CHLORIDE 0.9 % IV SOLN: INTRAVENOUS | Status: DC

## 2011-05-10 MED ORDER — PANTOPRAZOLE SODIUM 40 MG PO TBEC
40.0000 mg | DELAYED_RELEASE_TABLET | Freq: Every day | ORAL | Status: DC
  Administered 2011-05-10 – 2011-05-11 (×2): 40 mg via ORAL
  Filled 2011-05-10: qty 1

## 2011-05-10 MED ORDER — ONDANSETRON HCL 4 MG PO TABS: 4.0000 mg | ORAL_TABLET | Freq: Four times a day (QID) | ORAL | Status: DC | PRN

## 2011-05-10 MED ORDER — INSULIN ASPART PROT & ASPART (70-30 MIX) 100 UNIT/ML ~~LOC~~ SUSP
15.0000 [IU] | Freq: Two times a day (BID) | SUBCUTANEOUS | Status: DC
  Administered 2011-05-10 – 2011-05-11 (×3): 15 [IU] via SUBCUTANEOUS
  Filled 2011-05-10: qty 3

## 2011-05-10 MED ORDER — POTASSIUM CHLORIDE 20 MEQ/15ML (10%) PO LIQD
40.0000 meq | Freq: Once | ORAL | Status: AC
  Administered 2011-05-10: 40 meq via ORAL

## 2011-05-10 MED ORDER — ACETAMINOPHEN 650 MG RE SUPP: 650.0000 mg | Freq: Four times a day (QID) | RECTAL | Status: DC | PRN

## 2011-05-10 NOTE — ED Notes (Signed)
Pt sleeping soundly after meds and meds and iv were recoreded

## 2011-05-10 NOTE — ED Notes (Signed)
Er Dr aware that pt remains hypotensive and nonsymptomatic.

## 2011-05-10 NOTE — Progress Notes (Signed)
Utilization review complete 

## 2011-05-10 NOTE — H&P (Signed)
Date: 05/10/2011                  Patient Name:  Melody Gray  MRN: [id number]  DOB: 09-23-1970  Age / Sex: 41 y.o., female   PCP: Rich Reining, MD, MD                 Medical Service: Internal Medicine Teaching Service                 Attending Physician: Dr. Larey Dresser     First Contact: Dr. Rosine Door  Pager: [phone]  Second Contact: Dr. Silverio Decamp  Pager: [phone]              After Hours (After 5pm / weekends / holidays): First Contact  Pager: [phone]   Second Contact  Pager: [phone]     Chief Complaint: abdominal pain  History of Present Illness: Patient is a 41 y.o. female with a PMHx of DM, sarcoidosis of lung and liver, who presents to [facility] for evaluation of abdominal pain. Pt indicates that since Monday, she has had right upper quadrant crampy abdominal pain that is intermittent in nature, and radiates to her right flank. Is aggravated with activity and improved with rest. Has had recent decreased oral intake during this time, and is drinking only hot water to help stop the pain. Has also had diarrhea for 4 days prior to admission with approximately 3 watery stools daily that is nonbloody in nature. She states that it has slowed since onset. Has also had malaise and fatigue during this time. Confirms chills and subjective fevers at home, but remained without lightheadedness, dizziness. Notably, pt has been out of her nexium for 1 month. Has continued to take all of her medications regularly including blood pressure medications and insulin as prescribed despite reduced decreased oral intake. Does not check her blood sugars at all, because she can tell when her blood sugars are high or low, and she hasn't felt symptomatic as such, therefore, was not too concerned about her diabetes control currently.  One month ago, was out of insulin for 1 week due to inability to afford. During that time period she checked her sugars and they got as high as 400s with some symptoms. But  has since continued to daily as prescribed.  Current Outpatient Medications: Medications Prior to Admission  Medication Dose Route Frequency Provider Last Rate Last Dose  . ibuprofen (ADVIL,MOTRIN) tablet 600 mg  600 mg Oral Once Hoy Morn, MD   600 mg at 05/10/11 0030  . morphine 4 MG/ML injection 8 mg  8 mg Intravenous Once Hoy Morn, MD   8 mg at 05/10/11 0030  . ondansetron (ZOFRAN) injection 4 mg  4 mg Intravenous Once Hoy Morn, MD   4 mg at 05/10/11 0030  . piperacillin-tazobactam (ZOSYN) IVPB 3.375 g  3.375 g Intravenous Once Hoy Morn, MD   3.375 g at 05/10/11 0035  . potassium chloride 20 MEQ/15ML (10%) liquid 40 mEq  40 mEq Oral Once Hoy Morn, MD      . sodium chloride 0.9 % bolus 1,000 mL  1,000 mL Intravenous Once Hoy Morn, MD   1,000 mL at 05/10/11 0030  . sodium chloride 0.9 % bolus 1,000 mL  1,000 mL Intravenous Once Hoy Morn, MD   1,000 mL at 05/10/11 0115  . sodium chloride 0.9 % bolus 1,000 mL  1,000 mL Intravenous Once Hoy Morn, MD  1,000 mL at 05/10/11 0454   Medications Prior to Admission  Medication Sig Dispense Refill  . carvedilol (COREG) 6.25 MG tablet Take 6.25 mg by mouth 2 (two) times daily.        . ferrous sulfate 325 (65 FE) MG tablet Take 325 mg by mouth 2 (two) times daily.        . furosemide (LASIX) 40 MG tablet Take 40 mg by mouth daily. Take 1 tablet by mouth daily       . insulin NPH-insulin regular (NOVOLIN 70/30) (70-30) 100 UNIT/ML injection Inject 25 Units into the skin 2 (two) times daily before a meal.        . lisinopril (PRINIVIL,ZESTRIL) 5 MG tablet Take 5 mg by mouth daily.        . metFORMIN (GLUCOPHAGE) 1000 MG tablet Take 1,000 mg by mouth 2 (two) times daily with a meal.        . Mometasone Furo-Formoterol Fum (DULERA) 200-5 MCG/ACT AERO Inhale 2 puffs into the lungs 2 (two) times daily.       . potassium chloride SA (K-DUR,KLOR-CON) 20 MEQ tablet Take 1 tablet (20 mEq total) by mouth daily.  30  tablet  12  . predniSONE (DELTASONE) 20 MG tablet Take 5 mg by mouth every other day. take 5mg  total every other day (on odd days)        Allergies: Allergies  Allergen Reactions  . Vicodin (Hydrocodone-Acetaminophen) Itching     Past Medical History: Past Medical History  Diagnosis Date  . Sarcoidosis     Followed by Dr. Melvyn Novas; w/ liver involvement per biopsy 12/09, Reversible airway component so started on [facility] 01/2010; HFA 75% p coaching 05/2010  . Hypoxemia     CT angiogram 9/11>> No PE; PFTs 10/11- FEV1 1.20 (49%) with 16% better p B2, DLCO 33%> corrects to 84; O2 sats ok on 4 lpm X rapid walk X 3 laps 05/2010 Uses nocturnal oxygen 2L.  . Morbid obesity     Target wt= 153 for BMI <30  . QT prolongation   . Diabetes mellitus   . Hypertension   . Hx of cardiac cath 2/08    No CAD, no RAS,  normal EF  . Seborrheic dermatitis of scalp   . 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 Hep B serology neg. but Hep C ab +, HIV neg. 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 Cardiomyopathy, nonischemic     EF 45% 12/10; Echo 7/11 normal EF, PAS 48  . Diabetic retinopathy     Right eye 2/11  . Health maintenance examination     Mammogram 05/2010 Negative; Last Pap smear 03/2008; Last DM eye exam 2/11> mild non-proliferative diabetic retinopathy. OD    Past Surgical History: Past Surgical History  Procedure Date  . Tubal ligation   . Breast surgery   . Cesarean section     Family History: Family History  Problem Relation Age of Onset  . Cancer Mother     colon cancer  . Multiple sclerosis Father   . Asthma Sister     in childhood  . Diabetes Father   . Hypertension      Social History: History   Social History  . Marital Status: Single    Spouse Name: N/A    Number of Children: 2  . Years of Education: N/A   Occupational History  .  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 Smoking status: Current Everyday Smoker -- 0.2 packs/day for 20 years    Types: Cigarettes  . Smokeless tobacco: Never Used  . Alcohol Use: Yes     rare  . Drug Use: No  . Sexually Active: Not on file   Other Topics Concern  . Not on file   Social History Narrative   Diabetic card given 05/03/2010.Financial assistance approved for 100% discount at [name] and has [facility] card. 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    Review of Systems: Constitutional:  admits to fever, chills, diaphoresis, appetite change and fatigue.  HEENT: denies photophobia, eye pain, redness, hearing loss, ear pain, congestion, sore throat, rhinorrhea, sneezing, neck pain, neck stiffness and tinnitus.  Respiratory: denies SOB, DOE, chest tightness, and wheezing. ADMITS to cough sporadically.   Cardiovascular: denies chest pain, palpitations and leg swelling.  Gastrointestinal: admits to nausea, vomiting, abdominal pain, diarrhea, constipation, blood in stool.  Genitourinary: denies dysuria, urgency, frequency, hematuria, and difficulty urinating. ADMITS to flank pain on the Right side.   Musculoskeletal: denies  myalgias, back pain, joint swelling, arthralgias and gait problem.   Skin: denies pallor, rash and wound.  Neurological: admits to dizziness, headaches.  DENIES seizures, syncope, weakness, light-headedness, numbness.  Hematological: denies adenopathy, easy bruising, personal or family bleeding history.  Psychiatric/ Behavioral: denies suicidal ideation, mood changes, confusion, nervousness, sleep disturbance and agitation.    Vital Signs: Blood pressure 98/51, pulse 91, temperature 99.3 F (37.4 C), temperature source Oral, resp. rate 16, last menstrual period 04/06/2011, SpO2 100.00%.  Physical Exam: General: Vital signs reviewed and noted. Well-developed, obese, in no acute distress; alert, appropriate and cooperative throughout examination.    Head: Normocephalic, atraumatic.  Eyes: PERRL, EOMI, No signs of anemia or jaundince.  Nose: Mucous membranes moist, not inflammed, nonerythematous.  Throat: Oropharynx erythematous, no exudate appreciated.   Neck: No deformities, masses, or tenderness noted.Supple, No carotid Bruits, no JVD.  Lungs:  Normal respiratory effort. Clear to auscultation BL without crackles or wheezes.  Heart:  S1 and S2 heard, no murmurs appreciated.  Abdomen:  BS normoactive. Soft, Nondistended, majority of tenderness in midepigastrium. Some pain in upper abdomen bilaterally with radiation to R flank.  Liver not palpated due to body habitus.   Extremities: No pretibial edema.  Neurologic: A&O X3, CN II - XII are grossly intact. Motor strength is 5/5 in the all 4 extremities, Sensations intact to light touch, Cerebellar signs negative.  Skin: No visible rashes, scars.   Lab results: Basic Metabolic Panel: Recent Labs  [facility] 05/09/11 2205   NA 133*   K 4.2   CL 96   CO2 25   GLUCOSE 134*   BUN 21   CREATININE 1.29*   CALCIUM 9.6   MG --   PHOS --   Liver Function Tests: Recent Labs  [facility]ename 05/09/11 2341   AST 23   ALT 22   ALKPHOS 148*   BILITOT 0.6   PROT 8.2   ALBUMIN 3.1*   Recent Labs  [facility] 05/09/11 2341   LIPASE 89*   AMYLASE --   CBC: Recent Labs  Basename 05/09/11 2205   WBC 14.0*   NEUTROABS 9.9*   HGB 11.4*   HCT 34.0*   MCV 89.5   PLT 153   Hemoglobin A1C: Lab Results  Component Value Date   HGBA1C 5.9 12/16/2010  Urinalysis: Some Hg, 3-6 RBCs, small leukocytes, 3-6 WBCs    Imaging results:  US Abdomen Complete  05/10/2011  *RADIOLOGY REPORT*  Clinical Data:  Right upper quadrant pain.  Gallstones.  COMPLETE ABDOMINAL ULTRASOUND  Comparison:  08/08/2008.  11/18/2009.  Findings:  Gallbladder:  Cholelithiasis is present.  No sonographic Murphy's sign.  No wall thickening.  Gallbladder wall is echogenic and there may be calcification.  Posterior acoustic  shadowing is present.  Common bile duct:  4 mm, normal.  Liver:  Hepatomegaly and hepatic cirrhosis is present.  No focal mass lesion.  IVC:  Appears normal.  Pancreas:  Pancreas grossly within normal limits.  Question mildly enlarged lymph node near the porta hepatis and pancreatic neck.  Spleen:  Splenomegaly with splenic volume of 1128 ml.  16 cm splenic span.  Right Kidney:  12.2 cm. Normal echotexture.  Normal central sinus echo complex.  No calculi or hydronephrosis.  Left Kidney:  10.9 cm. Normal echotexture.  Normal central sinus echo complex.  No calculi or hydronephrosis.  Abdominal aorta:  No aneurysm identified.  IMPRESSION: 1.  Echogenic gallbladder wall, likely representing dependently layering gallstones.  Calcification of the gallbladder wall is possible as well.  No mass lesion.  No findings of acute cholecystitis. 2.  Hepatic splenomegaly and nodular contour of the liver suggesting hepatic cirrhosis.  Previously seen liver lesion is not identified on today's examination. 3.  Possible small lymph node anterior to the pancreatic neck. This is nonspecific and not uncommonly seen in the setting of cirrhosis.  Original Report Authenticated By: Dereck Ligas, M.D.   Dg Chest Portable 1 View  05/10/2011  *RADIOLOGY REPORT*  Clinical Data: Right upper chest pain.  Short of breath. Sarcoidosis.  PORTABLE CHEST - 1 VIEW  Comparison: 10/05/2010.  Findings: Cardiomegaly is present.  Left axillary dissection. Right basilar scarring and / or atelectasis.  Prominence of the pulmonary hila, likely representing adenopathy associated with a history of sarcoidosis.  Left basilar atelectasis is noted.  No focal consolidation.  No effusion.  IMPRESSION: 1.  Cardiomegaly without failure. 2.  Low volume chest with basilar atelectasis. 3.  Prominence of the right pulmonary hilum, likely secondary to clinical history of sarcoidosis.  Original Report Authenticated By: Dereck Ligas, M.D.      Assessment &  Plan:  Pt is a 41 y.o. yo female with a PMHx of sarcoidosis, DM who was admitted on 05/09/2011 with symptoms of abdominal pain and diarrhea, which was determined to be secondary to out of PPI, gallstones, viral gastroenteritis. Interventions at this time will be focused on fluid repletion and electrolyte monitoring.    Abdominal pain - Pain is located mainly in the mid-epigastric region and is probably multi-factorial. [name] of abdomen ruled out acute cholecystitis but she does have gall stones that could be painful and pt is recently out of her nexium for 1 month with pain gradually worsening over that time, additionally she could have a viral enteritis. Did receive zosyn for 1 dose in the ED while concern for acute cholecystitis. At time of evaluation abdomen not acute, if worsening can consider getting CT abdomen or continuing antibiotics. If not improved with resuming PPI then it may be necessary to consider endoscopy to evaluate for PUD or inflammation. Will start protonix.    Fever - Pt arrived to ED with temp of 101. Could be from viral enteritis, UTI.  Blood cultures have been obtained as well as urine culture. She did have a chest x-ray in the  ED which did not show any focal consolidation concerning for pneumonia or infection.    Tachycardia - Likely secondary to volume depletion and poor PO intake but will check EKG. Currently HR 91.    Hyponatremia - Na of 133 likely due to increased free water intake with continued diuretic usage. Has received 2 L bolus in the ED and will be monitored with PO intake and maintenance fluids of 75 cc/hr.    Volume depletion - Pt's blood pressure was [id number] systolic with symptoms upon arrival to the ED and likely is due to GI loss coupled with decreased PO intake and continued diuretic use.    AKI - Cr. Currently at 1.29 and baseline is .9 We will continue to monitor post 2 L of fluids. Likely etiology pre-renal from GI loss and still taking diuretic and pt is on  a diuretic so getting a FeNa would not be helpful.    DM II - Will put pt on reduced dose of 70/30 insulin while she is here and keep her metformin going. Not likely to be the etiology of the diarrhea as that is acute in time course.    Sarcoidosis - Currently active in the lungs and liver. If abdominal pain is not resolving could be involvement of the small bowel and may warrant endoscopic evaluation. Currently on prednisone 5 mg PO every other day (on odd days) and will check AM cortisol.    Hepatomegaly - noted in the past, likely due to sarcoidosis and biopsied in the past. Hep C positive with undetectable rna load at that time.   Hx cardiomyopathy - EF is 45 % with cath in 2008 showing no CAD. Received 2 L bolus in the ED and will give limited fluids.    DVT PPX - heparin    Servando Snare, Internal Medicine Resident 05/10/2011, 5:47 AM

## 2011-05-10 NOTE — H&P (Signed)
Internal Medicine Teaching Service Attending Note Date: 05/10/2011  Patient name: Melody Gray  Medical record number: [id number]  Date of birth: 02/04/1971   I have seen and evaluated Melody Gray and discussed their care with the Residency Team. Please see Melody Gray H&P for full details. Briefly, Melody Gray was admitted for substernal burning, RUQ and epigastric ABD pain, abd bloating, anorexia, & diarrhea. She has been able to eat, just less than normal. She ate a bacon sandwich on day of admit and a grilled cheese sandwich and milk the day prior. Food does not make the pain worse but does cause abd bloating. On admission, she was hypotensive, febrile, and tachycardic. She has not increased her steroids for stress dose steroids. ABD U/S showed known gallstones.   She has been out of her Nexium for one month 2/2 insurance coverage. She states that before she was on a PPI, she had the same sxs and Melody Gray started a PPI and the sxs resolved.   Physical Exam: Blood pressure 114/65, pulse 91, temperature 99.1 F (37.3 C), temperature source Oral, resp. rate 18, last menstrual period 04/06/2011, SpO2 100.00%. NAD ABD + BS, soft, slight tenderness epigastric and RUQ. No acute ABD.  Lab results: Results for orders placed during the hospital encounter of 05/09/11 (from the past 24 hour(s))  URINALYSIS, ROUTINE W REFLEX MICROSCOPIC     Status: Abnormal   Collection Time   05/09/11  9:23 PM      Component Value Range   Color, Urine YELLOW  YELLOW    APPearance CLOUDY (*) CLEAR    Specific Gravity, Urine 1.008  1.005 - 1.030    pH 5.0  5.0 - 8.0    Glucose, UA NEGATIVE  NEGATIVE (mg/dL)   Hgb urine dipstick LARGE (*) NEGATIVE    Bilirubin Urine NEGATIVE  NEGATIVE    Ketones, ur NEGATIVE  NEGATIVE (mg/dL)   Protein, ur NEGATIVE  NEGATIVE (mg/dL)   Urobilinogen, UA 1.0  0.0 - 1.0 (mg/dL)   Nitrite NEGATIVE  NEGATIVE    Leukocytes, UA SMALL (*) NEGATIVE   URINE MICROSCOPIC-ADD ON     Status:  Abnormal   Collection Time   05/09/11  9:23 PM      Component Value Range   Squamous Epithelial / LPF FEW (*) RARE    WBC, UA 3-6  <3 (WBC/hpf)   RBC / HPF 3-6  <3 (RBC/hpf)  POCT PREGNANCY, URINE     Status: Normal   Collection Time   05/09/11  9:29 PM      Component Value Range   Preg Test, Ur NEGATIVE    CBC     Status: Abnormal   Collection Time   05/09/11 10:05 PM      Component Value Range   WBC 14.0 (*) 4.0 - 10.5 (K/uL)   RBC 3.80 (*) 3.87 - 5.11 (MIL/uL)   Hemoglobin 11.4 (*) 12.0 - 15.0 (g/dL)   HCT 34.0 (*) 36.0 - 46.0 (%)   MCV 89.5  78.0 - 100.0 (fL)   MCH 30.0  26.0 - 34.0 (pg)   MCHC 33.5  30.0 - 36.0 (g/dL)   RDW 13.9  11.5 - 15.5 (%)   Platelets 153  150 - 400 (K/uL)  DIFFERENTIAL     Status: Abnormal   Collection Time   05/09/11 10:05 PM      Component Value Range   Neutrophils Relative 71  43 - 77 (%)   Neutro Abs 9.9 (*) 1.7 -  7.7 (K/uL)   Lymphocytes Relative 14  12 - 46 (%)   Lymphs Abs 1.9  0.7 - 4.0 (K/uL)   Monocytes Relative 15 (*) 3 - 12 (%)   Monocytes Absolute 2.0 (*) 0.1 - 1.0 (K/uL)   Eosinophils Relative 1  0 - 5 (%)   Eosinophils Absolute 0.2  0.0 - 0.7 (K/uL)   Basophils Relative 0  0 - 1 (%)   Basophils Absolute 0.0  0.0 - 0.1 (K/uL)  BASIC METABOLIC PANEL     Status: Abnormal   Collection Time   05/09/11 10:05 PM      Component Value Range   Sodium 133 (*) 135 - 145 (mEq/L)   Potassium 4.2  3.5 - 5.1 (mEq/L)   Chloride 96  96 - 112 (mEq/L)   CO2 25  19 - 32 (mEq/L)   Glucose, Bld 134 (*) 70 - 99 (mg/dL)   BUN 21  6 - 23 (mg/dL)   Creatinine, Ser 1.29 (*) 0.50 - 1.10 (mg/dL)   Calcium 9.6  8.4 - 10.5 (mg/dL)   GFR calc non Af Amer 50 (*) >90 (mL/min)   GFR calc Af Amer 58 (*) >90 (mL/min)  LIPASE, BLOOD     Status: Abnormal   Collection Time   05/09/11 11:41 PM      Component Value Range   Lipase 89 (*) 11 - 59 (U/L)  HEPATIC FUNCTION PANEL     Status: Abnormal   Collection Time   05/09/11 11:41 PM      Component Value Range   Total  Protein 8.2  6.0 - 8.3 (g/dL)   Albumin 3.1 (*) 3.5 - 5.2 (g/dL)   AST 23  0 - 37 (U/L)   ALT 22  0 - 35 (U/L)   Alkaline Phosphatase 148 (*) 39 - 117 (U/L)   Total Bilirubin 0.6  0.3 - 1.2 (mg/dL)   Bilirubin, Direct 0.2  0.0 - 0.3 (mg/dL)   Indirect Bilirubin 0.4  0.3 - 0.9 (mg/dL)  LACTIC ACID, PLASMA     Status: Normal   Collection Time   05/09/11 11:42 PM      Component Value Range   Lactic Acid, Venous 1.1  0.5 - 2.2 (mmol/L)  BASIC METABOLIC PANEL     Status: Abnormal   Collection Time   05/10/11  7:20 AM      Component Value Range   Sodium 136  135 - 145 (mEq/L)   Potassium 4.8  3.5 - 5.1 (mEq/L)   Chloride 102  96 - 112 (mEq/L)   CO2 24  19 - 32 (mEq/L)   Glucose, Bld 166 (*) 70 - 99 (mg/dL)   BUN 25 (*) 6 - 23 (mg/dL)   Creatinine, Ser 2.16 (*) 0.50 - 1.10 (mg/dL)   Calcium 8.0 (*) 8.4 - 10.5 (mg/dL)   GFR calc non Af Amer 27 (*) >90 (mL/min)   GFR calc Af Amer 31 (*) >90 (mL/min)  CORTISOL-AM, BLOOD     Status: Normal   Collection Time   05/10/11  7:20 AM      Component Value Range   Cortisol - AM 11.2  4.3 - 22.4 (ug/dL)  HEMOGLOBIN A1C     Status: Abnormal   Collection Time   05/10/11  7:20 AM      Component Value Range   Hemoglobin A1C 8.0 (*) <5.7 (%)   Mean Plasma Glucose 183 (*) <117 (mg/dL)  GLUCOSE, CAPILLARY     Status: Abnormal   Collection Time  05/10/11 11:27 AM      Component Value Range   Glucose-Capillary 109 (*) 70 - 99 (mg/dL)    Imaging results:  US Abdomen Complete  05/10/2011  *RADIOLOGY REPORT*  Clinical Data:  Right upper quadrant pain.  Gallstones.  COMPLETE ABDOMINAL ULTRASOUND  Comparison:  08/08/2008.  11/18/2009.  Findings:  Gallbladder:  Cholelithiasis is present.  No sonographic Murphy's sign.  No wall thickening.  Gallbladder wall is echogenic and there may be calcification.  Posterior acoustic shadowing is present.  Common bile duct:  4 mm, normal.  Liver:  Hepatomegaly and hepatic cirrhosis is present.  No focal mass lesion.  IVC:   Appears normal.  Pancreas:  Pancreas grossly within normal limits.  Question mildly enlarged lymph node near the porta hepatis and pancreatic neck.  Spleen:  Splenomegaly with splenic volume of 1128 ml.  16 cm splenic span.  Right Kidney:  12.2 cm. Normal echotexture.  Normal central sinus echo complex.  No calculi or hydronephrosis.  Left Kidney:  10.9 cm. Normal echotexture.  Normal central sinus echo complex.  No calculi or hydronephrosis.  Abdominal aorta:  No aneurysm identified.  IMPRESSION: 1.  Echogenic gallbladder wall, likely representing dependently layering gallstones.  Calcification of the gallbladder wall is possible as well.  No mass lesion.  No findings of acute cholecystitis. 2.  Hepatic splenomegaly and nodular contour of the liver suggesting hepatic cirrhosis.  Previously seen liver lesion is not identified on today's examination. 3.  Possible small lymph node anterior to the pancreatic neck. This is nonspecific and not uncommonly seen in the setting of cirrhosis.  Original Report Authenticated By: Dereck Ligas, M.D.   Dg Chest Portable 1 View  05/10/2011  *RADIOLOGY REPORT*  Clinical Data: Right upper chest pain.  Short of breath. Sarcoidosis.  PORTABLE CHEST - 1 VIEW  Comparison: 10/05/2010.  Findings: Cardiomegaly is present.  Left axillary dissection. Right basilar scarring and / or atelectasis.  Prominence of the pulmonary hila, likely representing adenopathy associated with a history of sarcoidosis.  Left basilar atelectasis is noted.  No focal consolidation.  No effusion.  IMPRESSION: 1.  Cardiomegaly without failure. 2.  Low volume chest with basilar atelectasis. 3.  Prominence of the right pulmonary hilum, likely secondary to clinical history of sarcoidosis.  Original Report Authenticated By: Dereck Ligas, M.D.    Assessment and Plan: I agree with the formulated Assessment and Plan with the following changes:  1. Dyspepsia - [facility]'s strategy for dyspepsia for a high H pylori  prev country is to test and tx for h pylori. I could find no abx test nor path report for H pylori. Would rec H pylori serology if the pt confirms that she has never been treated previously for H pylori. Restart her PPI. Monitor sxs. EGD if sxs recur or do not get better as Melody Doug Sou has pointed out that sarcoid of the sm intestine, although rare, can present with these sxs.   2. Adrenal insuff - needs stress dose steroids. Triple dose for three days. Would help the fever and fatigue. Possibly her hypotension also.   3. SIRS - doubt infectious etiology. No ABX at this time. Could be mix of vol contraction (although pt still taking PO), adrenal insuff, and pain. Follow vitals.   Likely D/C 1 or 2 days

## 2011-05-11 LAB — BASIC METABOLIC PANEL
BUN: 16 mg/dL (ref 6–23)
CO2: 25 mEq/L (ref 19–32)
Calcium: 8.7 mg/dL (ref 8.4–10.5)
Chloride: 102 mEq/L (ref 96–112)
Creatinine, Ser: 0.94 mg/dL (ref 0.50–1.10)
GFR calc Af Amer: 86 mL/min — ABNORMAL LOW (ref >90)
GFR calc non Af Amer: 74 mL/min — ABNORMAL LOW (ref >90)
Glucose, Bld: 208 mg/dL — ABNORMAL HIGH (ref 70–99)
Potassium: 4.8 mEq/L (ref 3.5–5.1)
Sodium: 132 mEq/L — ABNORMAL LOW (ref 135–145)

## 2011-05-11 LAB — CBC
HCT: 29.3 % — ABNORMAL LOW (ref 36.0–46.0)
Hemoglobin: 9.5 g/dL — ABNORMAL LOW (ref 12.0–15.0)
MCH: 29.4 pg (ref 26.0–34.0)
MCHC: 32.4 g/dL (ref 30.0–36.0)
MCV: 90.7 fL (ref 78.0–100.0)
Platelets: 136 10*3/uL — ABNORMAL LOW (ref 150–400)
RBC: 3.23 MIL/uL — ABNORMAL LOW (ref 3.87–5.11)
RDW: 13.8 % (ref 11.5–15.5)
WBC: 6.2 10*3/uL (ref 4.0–10.5)

## 2011-05-11 LAB — GLUCOSE, CAPILLARY
Glucose-Capillary: 186 mg/dL — ABNORMAL HIGH (ref 70–99)
Glucose-Capillary: 166 mg/dL — ABNORMAL HIGH (ref 70–99)

## 2011-05-11 MED ORDER — CIPROFLOXACIN HCL 500 MG PO TABS: 500.0000 mg | ORAL_TABLET | Freq: Two times a day (BID) | ORAL | Status: AC

## 2011-05-11 MED ORDER — LEVOFLOXACIN 500 MG PO TABS
500.0000 mg | ORAL_TABLET | Freq: Every day | ORAL | Status: DC
  Administered 2011-05-11: 500 mg via ORAL
  Filled 2011-05-11: qty 1

## 2011-05-11 MED ORDER — PREDNISONE 2.5 MG PO TABS: 7.5000 mg | ORAL_TABLET | Freq: Every day | ORAL | Status: AC

## 2011-05-11 MED ORDER — OMEPRAZOLE 40 MG PO CPDR: 40.0000 mg | DELAYED_RELEASE_CAPSULE | Freq: Two times a day (BID) | ORAL | Status: DC

## 2011-05-11 NOTE — Progress Notes (Signed)
Subjective: She is doing well, the abdominal pain is much improved but still has SOB whenever she has pain but this is her baseline.  She denies any dysuria but reports having diarrhea for several days last week.  Currently she does not have anymore diarrhea episodes since yesterday. She is ready to go home. Objective: Vital signs in last 24 hours: Filed Vitals:   05/10/11 1527 05/10/11 2156 05/11/11 0500 05/11/11 0600  BP: 114/65 100/66  131/78  Pulse: 91 78  79  Temp:  97.8 F (36.6 C)  97.6 F (36.4 C)  TempSrc:  Oral  Oral  Resp:  18  19  Height:  5\' 1"  (1.549 m)    Weight:  204 lb (92.534 kg) 204 lb 5.9 oz (92.7 kg)   SpO2:  100%  100%   Weight change:   Intake/Output Summary (Last 24 hours) at 05/11/11 0752 Last data filed at 05/10/11 1800  Gross per 24 hour  Intake   2200 ml  Output      0 ml  Net   2200 ml   Physical Exam: GEN: NAD Heart: distant heart sounds, S1 S2, no murmur gallop or rubs Lungs: mild coarse breath sounds and occasional wheezing, but significant improvement from yesterday, no accessory muscle use Abdomen: soft, nontender, nondistended, normal BS Neuro: nonfocal  Lab Results: Basic Metabolic Panel:  Lab [id number] 05/10/11 0720  NA 132* 136  K 4.8 4.8  CL 102 102  CO2 25 24  GLUCOSE 208* 166*  BUN 16 25*  CREATININE 0.94 2.16*  CALCIUM 8.7 8.0*  MG -- --  PHOS -- --   Liver Function Tests:  Lab 05/09/11 2341  AST 23  ALT 22  ALKPHOS 148*  BILITOT 0.6  PROT 8.2  ALBUMIN 3.1*    Lab 05/09/11 2341  LIPASE 89*  AMYLASE --   CBC:  Lab 05/11/11 0558 05/09/11 2205  WBC 6.2 14.0*  NEUTROABS -- 9.9*  HGB 9.5* 11.4*  HCT 29.3* 34.0*  MCV 90.7 89.5  PLT 136* 153   CBG:  Lab 05/11/11 0649 05/10/11 2140 05/10/11 1713 05/10/11 1127  GLUCAP 186* 182* 180* 109*   Hemoglobin A1C:  Lab 05/10/11 0720  HGBA1C 8.0*     Micro Results: Recent Results (from the past 240 hour(s))  URINE CULTURE     Status: Normal (Preliminary  result)   Collection Time   05/10/11 12:41 AM      Component Value Range Status Comment   Specimen Description URINE, RANDOM   Final    Special Requests NONE   Final    Setup Time [id number]   Final    Colony Count >=100,000 COLONIES/ML   Final    Culture ESCHERICHIA COLI   Final    Report Status PENDING   Incomplete    Studies/Results: US Abdomen Complete  05/10/2011  *RADIOLOGY REPORT*  Clinical Data:  Right upper quadrant pain.  Gallstones.  COMPLETE ABDOMINAL ULTRASOUND  Comparison:  08/08/2008.  11/18/2009.  Findings:  Gallbladder:  Cholelithiasis is present.  No sonographic Murphy's sign.  No wall thickening.  Gallbladder wall is echogenic and there may be calcification.  Posterior acoustic shadowing is present.  Common bile duct:  4 mm, normal.  Liver:  Hepatomegaly and hepatic cirrhosis is present.  No focal mass lesion.  IVC:  Appears normal.  Pancreas:  Pancreas grossly within normal limits.  Question mildly enlarged lymph node near the porta hepatis and pancreatic neck.  Spleen:  Splenomegaly with splenic volume  of 1128 ml.  16 cm splenic span.  Right Kidney:  12.2 cm. Normal echotexture.  Normal central sinus echo complex.  No calculi or hydronephrosis.  Left Kidney:  10.9 cm. Normal echotexture.  Normal central sinus echo complex.  No calculi or hydronephrosis.  Abdominal aorta:  No aneurysm identified.  IMPRESSION: 1.  Echogenic gallbladder wall, likely representing dependently layering gallstones.  Calcification of the gallbladder wall is possible as well.  No mass lesion.  No findings of acute cholecystitis. 2.  Hepatic splenomegaly and nodular contour of the liver suggesting hepatic cirrhosis.  Previously seen liver lesion is not identified on today's examination. 3.  Possible small lymph node anterior to the pancreatic neck. This is nonspecific and not uncommonly seen in the setting of cirrhosis.  Original Report Authenticated By: Dereck Ligas, M.D.   Dg Chest Portable 1  View  05/10/2011  *RADIOLOGY REPORT*  Clinical Data: Right upper chest pain.  Short of breath. Sarcoidosis.  PORTABLE CHEST - 1 VIEW  Comparison: 10/05/2010.  Findings: Cardiomegaly is present.  Left axillary dissection. Right basilar scarring and / or atelectasis.  Prominence of the pulmonary hila, likely representing adenopathy associated with a history of sarcoidosis.  Left basilar atelectasis is noted.  No focal consolidation.  No effusion.  IMPRESSION: 1.  Cardiomegaly without failure. 2.  Low volume chest with basilar atelectasis. 3.  Prominence of the right pulmonary hilum, likely secondary to clinical history of sarcoidosis.  Original Report Authenticated By: Dereck Ligas, M.D.   Medications: Scheduled Meds:   . heparin  5,000 Units Subcutaneous Q8H  . insulin aspart  0-9 Units Subcutaneous TID WC  . insulin aspart protamine-insulin aspart  15 Units Subcutaneous BID WC  . metFORMIN  1,000 mg Oral BID WC  . Mometasone Furo-Formoterol Fum  2 puff Inhalation BID  . pantoprazole  40 mg Oral Q1200  . potassium chloride      . predniSONE  7.5 mg Oral Q breakfast  . predniSONE  5 mg Oral QODAY  . sodium chloride  1,000 mL Intravenous Once  . DISCONTD: predniSONE  7.5 mg Oral Q breakfast  . DISCONTD: predniSONE  5 mg Oral QODAY  . DISCONTD: predniSONE  7.5 mg Oral Q breakfast   Continuous Infusions:   . sodium chloride 75 mL/hr at 05/10/11 0945  . sodium chloride 100 mL/hr at 05/10/11 1400   PRN Meds:.acetaminophen, acetaminophen, ondansetron (ZOFRAN) IV, ondansetron Assessment/Plan: Abdominal pain -Resolved after resume PPI. likely from GERD.  Will continue PPI Tachycardia - Resolved  Hyponatremia - Slightly low with Na of 132.  Likely from volume depletion.  Will continue to monitor BMET and continue IVF.  AKI - likely volume contraction. Cr is back to baseline after repletion with IVF  DM II - stable, CBGs 189-208.  She is on a reduced dose than home dose (25units BID of 70/30).   Will resume home dose at discharge  Sarcoidosis - stable, will continue the increased dose of 7.5mg  x 2 more days and then resume 5mg  every other day. UTI- Urinalysis shows E coli >100,000 colonies in setting of recent diarrhea.  Will treat with 5 day course of Levaquin while urine culture pending. Will follow up with her PCP in the next few days. Anemia of Iron deficiency: Hb baseline is between 10-11.  She was hemoconcentrated on admission 11 but now dropped to 9.5 which is probably hemodilution.  She denies any bleeding at this time.  She did have an anemia panel in 10/2009 which shows  Iron 17, Ferritin 60, Sat 4%, UIBC 361, and TIBC 378.  She is currently taking Iron supplementation BID as outpatient. Will resume her home med at discharge and repeat CBC next week to make sure her Hb is stabilized.  DVT PPX - heparin      LOS: 2 days   Melody Gray 05/11/2011, 7:52 AM

## 2011-05-11 NOTE — Discharge Summary (Signed)
[facility] Discharge Note  Name: Melody Gray MRN: [id number] DOB: 1971-04-24 41 y.o.  Date of Admission: 05/09/2011  9:04 PM Date of Discharge: 05/11/2011 Attending Physician: Larey Dresser  Discharge Diagnosis: Principal Problem:  *Volume depletion, gastrointestinal loss Active Problems:  DIABETES MELLITUS, TYPE II, UNCONTROLLED  Anemia in chronic kidney disease  HYPERTENSION  SINUS TACHYCARDIA  CONGESTIVE HEART FAILURE  Respiratory failure, chronic  GERD  AKI (acute kidney injury)   Discharge Medications: Current Discharge Medication List    CONTINUE these medications which have NOT CHANGED   Details  carvedilol (COREG) 6.25 MG tablet Take 6.25 mg by mouth 2 (two) times daily.      ferrous sulfate 325 (65 FE) MG tablet Take 325 mg by mouth 2 (two) times daily.      furosemide (LASIX) 40 MG tablet Take 40 mg by mouth daily. Take 1 tablet by mouth daily     insulin NPH-insulin regular (NOVOLIN 70/30) (70-30) 100 UNIT/ML injection Inject 25 Units into the skin 2 (two) times daily before a meal.      lisinopril (PRINIVIL,ZESTRIL) 5 MG tablet Take 5 mg by mouth daily.      metFORMIN (GLUCOPHAGE) 1000 MG tablet Take 1,000 mg by mouth 2 (two) times daily with a meal.      Mometasone Furo-Formoterol Fum (DULERA) 200-5 MCG/ACT AERO Inhale 2 puffs into the lungs 2 (two) times daily.    Associated Diagnoses: Sarcoidosis    potassium chloride SA (K-DUR,KLOR-CON) 20 MEQ tablet Take 1 tablet (20 mEq total) by mouth daily. Qty: 30 tablet, Refills: 12   Associated Diagnoses: Hypokalemia    predniSONE (DELTASONE) 20 MG tablet Take 5 mg by mouth every other day. take 5mg  total every other day (on odd days)        Disposition and follow-up:   [name] was discharged from [facility] in stable and improved condition.    Follow-up Appointments: Follow-up Information    Follow up with Rich Reining, MD in 5  days.        Discharge Orders    Future Appointments: Provider: Department: Dept Phone: Center:   05/12/2011 11:30 AM Rich Reining, MD [facility] [phone] [facility]      Consultations:  None  Procedures Performed:  US Abdomen Complete  05/10/2011  *RADIOLOGY REPORT*  Clinical Data:  Right upper quadrant pain.  Gallstones.  COMPLETE ABDOMINAL ULTRASOUND  Comparison:  08/08/2008.  11/18/2009.  Findings:  Gallbladder:  Cholelithiasis is present.  No sonographic Murphy's sign.  No wall thickening.  Gallbladder wall is echogenic and there may be calcification.  Posterior acoustic shadowing is present.  Common bile duct:  4 mm, normal.  Liver:  Hepatomegaly and hepatic cirrhosis is present.  No focal mass lesion.  IVC:  Appears normal.  Pancreas:  Pancreas grossly within normal limits.  Question mildly enlarged lymph node near the porta hepatis and pancreatic neck.  Spleen:  Splenomegaly with splenic volume of 1128 ml.  16 cm splenic span.  Right Kidney:  12.2 cm. Normal echotexture.  Normal central sinus echo complex.  No calculi or hydronephrosis.  Left Kidney:  10.9 cm. Normal echotexture.  Normal central sinus echo complex.  No calculi or hydronephrosis.  Abdominal aorta:  No aneurysm identified.  IMPRESSION: 1.  Echogenic gallbladder wall, likely representing dependently layering gallstones.  Calcification of the gallbladder wall is possible as well.  No mass lesion.  No findings of acute cholecystitis. 2.  Hepatic splenomegaly  and nodular contour of the liver suggesting hepatic cirrhosis.  Previously seen liver lesion is not identified on today's examination. 3.  Possible small lymph node anterior to the pancreatic neck. This is nonspecific and not uncommonly seen in the setting of cirrhosis.  Original Report Authenticated By: Dereck Ligas, M.D.   Dg Chest Portable 1 View  05/10/2011  *RADIOLOGY REPORT*  Clinical Data: Right upper chest pain.  Short of breath. Sarcoidosis.  PORTABLE  CHEST - 1 VIEW  Comparison: 10/05/2010.  Findings: Cardiomegaly is present.  Left axillary dissection. Right basilar scarring and / or atelectasis.  Prominence of the pulmonary hila, likely representing adenopathy associated with a history of sarcoidosis.  Left basilar atelectasis is noted.  No focal consolidation.  No effusion.  IMPRESSION: 1.  Cardiomegaly without failure. 2.  Low volume chest with basilar atelectasis. 3.  Prominence of the right pulmonary hilum, likely secondary to clinical history of sarcoidosis.  Original Report Authenticated By: Dereck Ligas, M.D.    Admission HPI: Patient is a 41 y.o. female with a PMHx of DM, sarcoidosis of lung and liver, who presents to [facility] for evaluation of abdominal pain. Pt indicates that since Monday, she has had right upper quadrant crampy abdominal pain that is intermittent in nature, and radiates to her right flank. Is aggravated with activity and improved with rest. Has had recent decreased oral intake during this time, and is drinking only hot water to help stop the pain. Has also had diarrhea for 4 days prior to admission with approximately 3 watery stools daily that is nonbloody in nature. She states that it has slowed since onset. Has also had malaise and fatigue during this time. Confirms chills and subjective fevers at home, but remained without lightheadedness, dizziness. Notably, pt has been out of her nexium for 1 month. Has continued to take all of her medications regularly including blood pressure medications and insulin as prescribed despite reduced decreased oral intake. Does not check her blood sugars at all, because she can tell when her blood sugars are high or low, and she hasn't felt symptomatic as such, therefore, was not too concerned about her diabetes control currently.  One month ago, was out of insulin for 1 week due to inability to afford. During that time period she checked her sugars and they got as high as 400s with some symptoms.  But has since continued to daily as prescribed.      Hospital Course by problem list:  Abdominal pain -Likely from GERD vs. Dyspepsia from H. Pylori.  Her abdominal pain/bloating sensation resolved after we resumed her PPI.  Given that her symptoms are consistent with dyspepsia, will get serology of H. Pylori prior to discharge and her PCP can follow up the results as outpatient.  Adrenal insufficiency- Patient was hypotensive at admission as well as fatigue.  We tripled her home dose of steroids to 7.5mg  x 3 days in setting of acute illness, then will resume her home dose after 3 days.  AM cortisol was 11.2.  Hyponatremia - Slightly low with Na of 132 with a corrected Na of 133. Likely from volume depletion. She received IVF hydration and will need to repeat BMP in a few days with her PCP.   AKI - On admission her Cr was 1.29 - 2.16 which is likely volume contraction. Cr normalized back to baseline at time of discharge with a level of 0.94.  DM II - stable, CBGs 189-208. HbA1c on this admission is 8.0.  She is  on a reduced dose than home dose (25units BID of 70/30). Will resume home dose at discharge.  May consider to increase her 70/30 to help lower her HbA1c lower than 7.0.    Sarcoidosis - stable, will continue the increased dose of 7.5mg  x 2 more days and then resume 5mg  every other day.   UTI- Urinalysis shows E coli >100,000 colonies in setting of recent diarrhea. Will treat with 5 day course of Ciprofloxacin while urine culture pending. Will follow up with her PCP in the next few days.   Anemia of Iron deficiency: Hb baseline is between 10-11. She was hemoconcentrated on admission 11 but now dropped to 9.5 which is probably hemodilution. She denies any bleeding at this time. She did have an anemia panel in 10/2009 which shows Iron 17, Ferritin 60, Sat 4%, UIBC 361, and TIBC 378. She is currently taking Iron supplementation BID as outpatient. Will resume her home med at discharge and  repeat CBC next week to make sure her Hb is stabilized.   DVT PPX - heparin         Discharge Vitals:  BP 131/78  Pulse 79  Temp(Src) 97.6 F (36.4 C) (Oral)  Resp 19  Ht 5\' 1"  (1.549 m)  Wt 204 lb 5.9 oz (92.7 kg)  BMI 38.61 kg/m2  SpO2 100%  LMP 04/06/2011  Discharge Labs:  Results for orders placed during the hospital encounter of 05/09/11 (from the past 24 hour(s))  GLUCOSE, CAPILLARY     Status: Abnormal   Collection Time   05/10/11  5:13 PM      Component Value Range   Glucose-Capillary 180 (*) 70 - 99 (mg/dL)  GLUCOSE, CAPILLARY     Status: Abnormal   Collection Time   05/10/11  9:40 PM      Component Value Range   Glucose-Capillary 182 (*) 70 - 99 (mg/dL)  CBC     Status: Abnormal   Collection Time   05/11/11  5:58 AM      Component Value Range   WBC 6.2  4.0 - 10.5 (K/uL)   RBC 3.23 (*) 3.87 - 5.11 (MIL/uL)   Hemoglobin 9.5 (*) 12.0 - 15.0 (g/dL)   HCT 29.3 (*) 36.0 - 46.0 (%)   MCV 90.7  78.0 - 100.0 (fL)   MCH 29.4  26.0 - 34.0 (pg)   MCHC 32.4  30.0 - 36.0 (g/dL)   RDW 13.8  11.5 - 15.5 (%)   Platelets 136 (*) 150 - 400 (K/uL)  BASIC METABOLIC PANEL     Status: Abnormal   Collection Time   05/11/11  5:58 AM      Component Value Range   Sodium 132 (*) 135 - 145 (mEq/L)   Potassium 4.8  3.5 - 5.1 (mEq/L)   Chloride 102  96 - 112 (mEq/L)   CO2 25  19 - 32 (mEq/L)   Glucose, Bld 208 (*) 70 - 99 (mg/dL)   BUN 16  6 - 23 (mg/dL)   Creatinine, Ser 0.94  0.50 - 1.10 (mg/dL)   Calcium 8.7  8.4 - 10.5 (mg/dL)   GFR calc non Af Amer 74 (*) >90 (mL/min)   GFR calc Af Amer 86 (*) >90 (mL/min)  GLUCOSE, CAPILLARY     Status: Abnormal   Collection Time   05/11/11  6:49 AM      Component Value Range   Glucose-Capillary 186 (*) 70 - 99 (mg/dL)   Comment 1 Documented in Chart  Comment 2 Notify RN      Signed: Thomos Domine 05/11/2011, 11:34 AM

## 2011-05-12 ENCOUNTER — Encounter: Payer: Medicaid Other | Admitting: Internal Medicine

## 2011-05-12 ENCOUNTER — Encounter: Payer: Self-pay | Admitting: Internal Medicine

## 2011-05-15 ENCOUNTER — Other Ambulatory Visit: Payer: Self-pay | Admitting: Internal Medicine

## 2011-05-15 LAB — HELICOBACTER PYLORI ABS-IGG+IGA, BLD
H Pylori IgA: 25.8 U/mL — ABNORMAL HIGH (ref <9.0)
H Pylori IgG: 1.37 {ISR} — ABNORMAL HIGH

## 2011-05-15 MED ORDER — AMOXICILLIN 500 MG PO CAPS: 1000.0000 mg | ORAL_CAPSULE | Freq: Two times a day (BID) | ORAL | Status: AC

## 2011-05-15 MED ORDER — CLARITHROMYCIN 500 MG PO TABS: 500.0000 mg | ORAL_TABLET | Freq: Two times a day (BID) | ORAL | Status: AC

## 2011-05-15 NOTE — Progress Notes (Signed)
Pt was called and informed that an antibiotic was called to her pharmacy for possible bacteria in her stomach per Dr. Tressie Stalker.  Pt is to take the antibiotic along with the Omeprazole.  Pt voiced understanding of the plan.  Sander Nephew, RN 1/14/20122 9:40 AM.

## 2011-06-28 ENCOUNTER — Other Ambulatory Visit: Payer: Self-pay | Admitting: *Deleted

## 2011-06-28 NOTE — Telephone Encounter (Signed)
Pt # [id number] or [id number]  Pt has scheduled appointment with you on 4/9 ( first available )

## 2011-06-29 MED ORDER — FUROSEMIDE 40 MG PO TABS: 40.0000 mg | ORAL_TABLET | Freq: Every day | ORAL | Status: DC

## 2011-06-30 ENCOUNTER — Encounter: Payer: Self-pay | Admitting: Cardiology

## 2011-06-30 ENCOUNTER — Ambulatory Visit (INDEPENDENT_AMBULATORY_CARE_PROVIDER_SITE_OTHER): Payer: Medicaid Other | Admitting: Cardiology

## 2011-06-30 DIAGNOSIS — I428 Other cardiomyopathies: Secondary | ICD-10-CM

## 2011-06-30 DIAGNOSIS — I10 Essential (primary) hypertension: Secondary | ICD-10-CM

## 2011-06-30 DIAGNOSIS — F17200 Nicotine dependence, unspecified, uncomplicated: Secondary | ICD-10-CM

## 2011-06-30 MED ORDER — LISINOPRIL 20 MG PO TABS: 20.0000 mg | ORAL_TABLET | Freq: Every day | ORAL | Status: DC

## 2011-06-30 MED ORDER — FUROSEMIDE 40 MG PO TABS: 40.0000 mg | ORAL_TABLET | Freq: Every day | ORAL | Status: DC

## 2011-06-30 NOTE — Assessment & Plan Note (Signed)
Blood pressure elevated. Increase lisinopril as described.

## 2011-06-30 NOTE — Progress Notes (Signed)
HPI: Pleasant female I saw in July of 2012 for evaluation of abnormal echocardiogram. Cardiac catheterization in 2008 revealed no coronary artery disease, ejection fraction 50-55% and no renal artery stenosis. Echocardiogram in June of 2012 revealed an ejection fraction of 35% which was new. There was mild biatrial enlargement, mild right ventricular enlargement and mildly reduced RV function. Cardiac MRI in July of 2012 revealed Moderate to severe LV systolic dysfunction, EF [id number]. The anterior and anterolateral walls appeared severely hypokinetic. Mild RV dilation with mild systolic dysfunction. Possible small areas of mid wall delayed enhancement in the apical septal and apical lateral walls. These areas were very faint and not definitely of clinical significance. Mid wall enhancement can be found in infiltrative cardiomyopathies such as that due to sarcoidosis. TSH and ferritin normal. Cardiac catheterization repeated in September of 2012 and showed an ejection fraction of 40%. There was no coronary disease. Since I last saw her, she ran out of her Lasix and is describing increased dyspnea on exertion and mild lower extremity edema. No chest pain or syncope.   Current Outpatient Prescriptions  Medication Sig Dispense Refill  . carvedilol (COREG) 6.25 MG tablet Take 6.25 mg by mouth 2 (two) times daily.        . ferrous sulfate 325 (65 FE) MG tablet Take 325 mg by mouth 2 (two) times daily.        . insulin NPH-insulin regular (NOVOLIN 70/30) (70-30) 100 UNIT/ML injection Inject 25 Units into the skin 2 (two) times daily before a meal.        . lisinopril (PRINIVIL,ZESTRIL) 5 MG tablet Take 5 mg by mouth daily.        . metFORMIN (GLUCOPHAGE) 1000 MG tablet Take 1,000 mg by mouth 2 (two) times daily with a meal.        . omeprazole (PRILOSEC) 40 MG capsule Take 1 capsule (40 mg total) by mouth 2 (two) times daily.  60 capsule  11  . potassium chloride SA (K-DUR,KLOR-CON) 20 MEQ tablet Take 1 tablet  (20 mEq total) by mouth daily.  30 tablet  12     Past Medical History  Diagnosis Date  . Sarcoidosis     Followed by Dr. Melvyn Novas; w/ liver involvement per biopsy 12/09, Reversible airway component so started on [facility] 01/2010; HFA 75% p coaching 05/2010  . Hypoxemia     CT angiogram 9/11>> No PE; PFTs 10/11- FEV1 1.20 (49%) with 16% better p B2, DLCO 33%> corrects to 84; O2 sats ok on 4 lpm X rapid walk X 3 laps 05/2010  . Morbid obesity     Target wt= 153 for BMI <30  . QT prolongation   . Diabetes mellitus   . Hypertension   . Hx of cardiac cath 2/08    No CAD, no RAS,  normal EF  . Seborrheic dermatitis of scalp   . 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 Hep B serology neg. but Hep C ab +, HIV neg. 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 Cardiomyopathy, nonischemic     EF 45% 12/10; Echo 7/11 normal EF, PAS 48  . Diabetic retinopathy     Right eye 2/11  . Health maintenance examination     Mammogram 05/2010 Negative; Last Pap smear 03/2008; Last DM eye exam 2/11> mild non-proliferative diabetic retinopathy. OD  . Helicobacter pylori ab+ 05/2011  Pt was symptomatic and treatment planned for 05/2011    Past Surgical History  Procedure Date  . Tubal ligation   . Breast surgery   . Cesarean section     History   Social History  . Marital Status: Single    Spouse Name: N/A    Number of Children: 2  . Years of Education: N/A   Occupational History  . 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 Smoking status: Current Everyday Smoker -- 0.2 packs/day for 20 years    Types: Cigarettes  . Smokeless tobacco: Never Used  . Alcohol Use: Yes     rare  . Drug Use: No  . Sexually Active: Yes   Other Topics Concern  . Not on file   Social History Narrative   Diabetic card given 05/03/2010.Financial assistance approved for 100% discount at [facility] and has [facility] card. 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    ROS: Ongoing URI but hemoptysis, dysphasia, odynophagia, melena, hematochezia, dysuria, hematuria, rash, seizure activity, orthopnea, PND, claudication. Remaining systems are negative.  Physical Exam: Well-developed obese in no acute distress.  Skin is warm and dry.  HEENT is normal.  Neck is supple. No thyromegaly.  Chest is clear to auscultation with normal expansion.  Cardiovascular exam is regular rate and rhythm.  Abdominal exam nontender or distended. No masses palpated. Extremities show edema. neuro grossly intact  ECG sinus rhythm at a rate of 97. No significant ST changes.

## 2011-06-30 NOTE — Assessment & Plan Note (Signed)
Patient with increased dyspnea. She has run out of her Lasix. Resume Lasix 40 mg daily. Check potassium and renal function in one week.

## 2011-06-30 NOTE — Assessment & Plan Note (Signed)
Etiology unclear. Catheterization showed no coronary disease. Previous TSH and ferritin normal. Cardiac MRI not classic for cardiac sarcoid. Question hypertension. Continue beta blocker. Increase lisinopril to 20 mg daily. Plan repeat echocardiogram when meds fully titrated. Note the ejection fraction 40% on previous cath.

## 2011-06-30 NOTE — Patient Instructions (Signed)
Your physician recommends that you schedule a follow-up appointment in: 3 MONTHS  RESTART FUROSEMIDE 40 MG ONCE DAILY  INCREASE LISINOPRIL TO 20 MG ONCE DAILY  Your physician recommends that you return for lab work in: [place]

## 2011-06-30 NOTE — Assessment & Plan Note (Signed)
Patient counseled on discontinuing. 

## 2011-07-07 ENCOUNTER — Other Ambulatory Visit: Payer: Self-pay

## 2011-08-07 ENCOUNTER — Ambulatory Visit: Payer: Self-pay | Admitting: Internal Medicine

## 2011-08-08 ENCOUNTER — Ambulatory Visit (INDEPENDENT_AMBULATORY_CARE_PROVIDER_SITE_OTHER): Payer: Medicaid Other | Admitting: Internal Medicine

## 2011-08-08 ENCOUNTER — Encounter: Payer: Self-pay | Admitting: Internal Medicine

## 2011-08-08 VITALS — BP 162/88 | HR 98 | Temp 98.9°F | Ht 61.0 in | Wt 217.2 lb

## 2011-08-08 DIAGNOSIS — IMO0001 Reserved for inherently not codable concepts without codable children: Secondary | ICD-10-CM

## 2011-08-08 DIAGNOSIS — F17200 Nicotine dependence, unspecified, uncomplicated: Secondary | ICD-10-CM

## 2011-08-08 DIAGNOSIS — Z0000 Encounter for general adult medical examination without abnormal findings: Secondary | ICD-10-CM

## 2011-08-08 DIAGNOSIS — Z79899 Other long term (current) drug therapy: Secondary | ICD-10-CM

## 2011-08-08 LAB — POCT GLYCOSYLATED HEMOGLOBIN (HGB A1C): Hemoglobin A1C: 7

## 2011-08-08 LAB — GLUCOSE, CAPILLARY: Glucose-Capillary: 228 mg/dL — ABNORMAL HIGH (ref 70–99)

## 2011-08-08 NOTE — Progress Notes (Signed)
Subjective:     Patient ID: Melody Gray, female   DOB: August 14, 1970, 41 y.o.   MRN: [id number]  HPI Pt is a very pleasant 41 yo F with multiple medical problems who 41 presents for followup.  # Sarcoid: pt followed by Dr. Melvyn Novas of [facility] pulm.  She is on O2 at night and during activity during the day.  She con't to smoke 1/3 PPD and is interested in quitting.  Breathing a little worse than usual, but not too much so.  # CHF: reduced EF seen on EF, with negative cath recently.  Followed by [facility] cardiology. Also has cardiac MRI, etiology remains unclear.  PT endorses some PND.  # DM: compliance with meds but not checking sugars frequently as the meter our clinic uses has changed.  # Pt says her Fe and K have not been filled by pharmacare and thus she has not been taking them.  She is also trying to get a BP cuff/monitor for home, as directed by her cardiologist.  Review of Systems As per HPI    Objective:   Physical Exam Gen: NAD Chest: CTAB CV: RRR    Assessment:         Plan:

## 2011-08-08 NOTE — Assessment & Plan Note (Signed)
A1c is 7.0 today, down from 8.  Pt compliant with meds but does not check at home.  No lows. - con't 70/30 - con't metformin - make appt with Butch Penny for discussion about meter and general DM check-in (did not have enough time to discuss exercise, which the pt is interested in)

## 2011-08-08 NOTE — Assessment & Plan Note (Signed)
-   Pt unable to get Fe and K from pharmacare - I will call them to clarify. - see discussion on smoking cessation

## 2011-08-08 NOTE — Assessment & Plan Note (Addendum)
A significant portion of this visit was spent discussing smoking cessation, as that is an area that has been difficult for this pt for quite some time.  We discussed triggers, pro's/con's of quitting, and general strategies.  She is interested in quitting and has had unsuccessul attemps in the past.  She has patches at home but does not use them.  She is willing to try medical therapy but we do not know what her insurance will cover. - I will call pharmacare to determine if chantix or wellbutrin is covered and hopefully initiate therapy  ADDENDUM: chantix is covered.  Have rx'ed and discussed with the pt over the phone.  She is pleased to be starting soon.

## 2011-08-09 ENCOUNTER — Other Ambulatory Visit: Payer: Self-pay | Admitting: Internal Medicine

## 2011-08-09 ENCOUNTER — Telehealth: Payer: Self-pay | Admitting: Licensed Clinical Social Worker

## 2011-08-09 DIAGNOSIS — E876 Hypokalemia: Secondary | ICD-10-CM

## 2011-08-09 LAB — CBC
HCT: 34.3 % — ABNORMAL LOW (ref 36.0–46.0)
Hemoglobin: 10.8 g/dL — ABNORMAL LOW (ref 12.0–15.0)
MCH: 28.3 pg (ref 26.0–34.0)
MCHC: 31.5 g/dL (ref 30.0–36.0)
MCV: 89.8 fL (ref 78.0–100.0)
Platelets: 183 10*3/uL (ref 150–400)
RBC: 3.82 MIL/uL — ABNORMAL LOW (ref 3.87–5.11)
RDW: 14.1 % (ref 11.5–15.5)
WBC: 6 10*3/uL (ref 4.0–10.5)

## 2011-08-09 LAB — BASIC METABOLIC PANEL
BUN: 9 mg/dL (ref 6–23)
CO2: 32 mEq/L (ref 19–32)
Calcium: 8.4 mg/dL (ref 8.4–10.5)
Chloride: 100 mEq/L (ref 96–112)
Creat: 0.66 mg/dL (ref 0.50–1.10)
Glucose, Bld: 244 mg/dL — ABNORMAL HIGH (ref 70–99)
Potassium: 3.4 mEq/L — ABNORMAL LOW (ref 3.5–5.3)
Sodium: 138 mEq/L (ref 135–145)

## 2011-08-09 LAB — LIPID PANEL
Cholesterol: 185 mg/dL (ref 0–200)
HDL: 46 mg/dL (ref >39)
LDL Cholesterol: 75 mg/dL (ref 0–99)
Total CHOL/HDL Ratio: 4 Ratio
Triglycerides: 319 mg/dL — ABNORMAL HIGH (ref <150)
VLDL: 64 mg/dL — ABNORMAL HIGH (ref 0–40)

## 2011-08-09 MED ORDER — VARENICLINE TARTRATE 0.5 MG X 11 & 1 MG X 42 PO MISC: ORAL | Status: DC

## 2011-08-09 MED ORDER — POTASSIUM CHLORIDE CRYS ER 20 MEQ PO TBCR: 20.0000 meq | EXTENDED_RELEASE_TABLET | Freq: Every day | ORAL | Status: DC

## 2011-08-09 NOTE — Telephone Encounter (Signed)
Melody Gray was referred to CSW for referral to [facility] for chronic disease management.  Physician also discussed smoking cessation with pt as she is ready to quit.  CSW called pt to discuss [facility] and smoking cessation resources.  Melody Gray is already connect with [facility], her case manager is Melody Gray.  Pt states [facility] has been a good resources but she has not had any issues until now, needing a BP cuff per her cardiologist.  CSW inquired if pt had been given the smoking cessation Quit Line.  CSW provided the 800-QUIT-NOW line and informed pt she would be able to get linked with a smoking cessation coach.  Pt aware CSW to call Melody Gray case manager for Melody Gray and share concerns physician had regarding disease management.  Pt aware CSW is available to assist as needed.  CSW placed call to pt's [facility] care manager, Melody Gray discussed with care manager physician concerns regarding pt needing BP cuff, Rx not filled by pharmacare, DM management and smoking cessation.  Melody Gray states [facility] has access to BP cuff and can provide pt with one.  CSW provided name of pt's cardiologist.  Case Manager will also f/u with pharmacare and confirm pt has scheduled appt with our RD, Melody Penny P. As well as, contacted the Quit Now line.

## 2011-08-15 ENCOUNTER — Other Ambulatory Visit: Payer: Self-pay | Admitting: Dietician

## 2011-08-15 ENCOUNTER — Telehealth: Payer: Self-pay | Admitting: Dietician

## 2011-08-15 DIAGNOSIS — IMO0001 Reserved for inherently not codable concepts without codable children: Secondary | ICD-10-CM

## 2011-08-15 NOTE — Telephone Encounter (Signed)
Referral received from Dr. Dorthula Nettles. Spoke with patient who agreed to visit on Monday 08/21/11 at 3:30 PM for meter and weight loss for her diabetes.

## 2011-08-21 ENCOUNTER — Encounter: Payer: Self-pay | Admitting: Internal Medicine

## 2011-08-21 ENCOUNTER — Ambulatory Visit (INDEPENDENT_AMBULATORY_CARE_PROVIDER_SITE_OTHER): Payer: Medicaid Other | Admitting: Internal Medicine

## 2011-08-21 ENCOUNTER — Encounter: Payer: Self-pay | Admitting: Dietician

## 2011-08-21 VITALS — BP 124/84 | HR 95 | Temp 98.1°F | Ht 61.0 in | Wt 221.8 lb

## 2011-08-21 DIAGNOSIS — D869 Sarcoidosis, unspecified: Secondary | ICD-10-CM

## 2011-08-21 DIAGNOSIS — J9610 Chronic respiratory failure, unspecified whether with hypoxia or hypercapnia: Secondary | ICD-10-CM

## 2011-08-21 NOTE — Assessment & Plan Note (Signed)
Reviewed cal balance issues with her in detail

## 2011-08-21 NOTE — Assessment & Plan Note (Signed)
No evidence of active disease, needs f/u pft's scheduled  No evidence benefit from dulera so ok to leave off at this point

## 2011-08-21 NOTE — Progress Notes (Signed)
Subjective:     Patient ID: Melody Gray, female   DOB: November 13, 1970   MRN: [id number]  HPI  50 yobf  Smoker dx of sarcoidosis in 2001 = sob, cough became 02 dep in July 2011 last off prednsione March 2011 but daily since then and complicated by [facility]    January 06, 2010 ov  c/o increased SOB. Pt states outpt clinic advised her to follow up with Dr. Melvyn Novas for Sacoidosis. doe on 02 at 2 lpm sitting then 4 with activity gets off at the curb struggles with grocery store. Try taking Prednisone one half even days in am with breakfast  Be sure you take omeprazole Take one 30-60 min before first and last meals of the day  Stop nifedipine  Start Cardizem 240 mg once daily  Wear 24 hours per day, 2 lpm at rest and sleeping, 4lpm with activity, this is the best way to help your heart function better  CT Chest ( repeated with contrast)> no PE, just sarcoid changes   February 09, 2010 Followup sarcoid w/ PFTs. Breathing is the same- no better or worse.on 02 2 lpm w/in and then outside using 4lpm> cc doe x 50 ft still has to stop every aisle at [employer] but not [employer] parking. rec Start Dulera 2 puffs first thing in am and 2 puffs again in pm about 12 hours later and prednisone 10 mg every other day.   May 04, 2010 ov cc doe some better to point to where feels needs 02 less. rec  1) Stay on dulera 200 2 puffs first thing in am and 2 puffs again in pm about 12 hours later  2) Work on perfecting inhaler technique 3) Prednisone 20 mg 1/4 every other day  4) Stay on 02 2lpm 24 hours per day except increase to 4lpm with exercise  5) Weight control t thru ex    10/05/2010 ov/[name] back smoking and using 02 most of the time at 2 lpm never up to 4 and prednisone 10mg   One half every other day.  rec 1) Stay on dulera 200 2 puffs first thing in am and 2 puffs again in pm about 12 hours later  2) Work on perfecting inhaler technique: relax and blow all the way out then take a nice smooth deep breath back in,  triggering the inhaler at same time you start breathing  3) Prednisone 10  mg 1/2 every other day  4) Stay on 02 2lpm 24 hours per day except increase to 4lpm with exercise  5) Weight control   thru exercise on 02 4lpm 6) Please see patient coordinator before you leave today  to schedule repeat echo and we will call when it comes back for review   It is critical that you not smoke at all      02/02/2011 f/u ov/[name] cc no change doe,  Minimal cough, now on ace and coreg. No excess mucus. rec Change your 02 to 2lpm at bedtime and with any activity outside your house    08/21/2011 f/u ov/[name] still smoking but on chantix  cc doe x "up to a mile s stopping - off dulera and 02" but last did this one month prior to OV  - no cough or overt sinus or hb symptoms.  Sleeping ok on 02 without nocturnal  or early am exacerbation  of respiratory  c/o's or need for noct saba. Also denies any obvious fluctuation of symptoms with weather or environmental changes or  other aggravating or alleviating factors except as outlined above    Pt denies any significant sore throat, dysphagia, itching, sneezing,  nasal congestion or excess/ purulent secretions,  fever, chills, sweats, unintended wt loss, pleuritic or exertional cp, hempoptysis, orthopnea pnd or leg swelling.           Past Medical History:  Sarcoidosis (Dr [name])-w/ liver involvement per biopsy 03/2008 - Reversible airway component so start [facility] 01/2010 > better  - HFa 75% p coaching May 04, 2010  - ? Off prednisone 05/2011 Unexplained Hypoxemia July 2011  - CT angiogram 01/07/10 >>> no PE  - PFT's February 09, 2010 FEV1 1.20 (49%) with ratio 72 and FRC 90%, FEV1  16% better p B2, DLC0 33% > corrects to 84   Morbid obesity  - Target wt = 153 for BMI < 30  QT prolongation  Diabetes mellitus, type II  Hypertension  Heart catheterization 06-06-06 : No CAD, no RAS, normal EF  Hx eclampsia  Hx of scalp seborrheic dermatitis              Objective:   Physical Exam She is a minimally  cushingoid-appearing amb bf nad off 02  wt 238 January 06, 2010 >   217 May 04, 2010 > 203 10/05/2010 > 08/21/2011    221 There is no stigmata liver disease  skin: anicteric  HEENT: normocephalic; PEERLA; no nasal or pharyngeal abnormalities  neck: supple  nodes: no cervical lymphadenopathy  chest: clear to ausculatation and percussion  heart: no murmurs, gallops, or rubs  abd: soft, nontender; BS normoactive; no abdominal masses, tenderness, organomegaly; abdomen is obese    cxr 05/10/11 1. Cardiomegaly without failure.  2. Low volume chest with basilar atelectasis.  3. Prominence of the right pulmonary hilum, likely secondary to  clinical history of sarcoidosis.     Assessment:          Plan:

## 2011-08-21 NOTE — Assessment & Plan Note (Signed)
-    Complicated by PAH - Walked 3 laps @ 185 ft each stopped due to  End of test, no desat on 4lpm 10/05/2010  - 02/02/2011  Walked RA  2 laps @ 185 ft each stopped due to  desat to 88 so change rx to 2lpm with activity outside the house and at hs - 08/21/2011  Walked RA x 1 laps @ 185 ft each stopped due to desat 85%  Mostly due to obesity so needs to remember to wear 02 with activity to help burn fat

## 2011-08-21 NOTE — Patient Instructions (Signed)
Wear 02 at 2lpm whenever exercise outside your house and always when you sleep.  Please schedule a follow up visit in 3 months but call sooner if needed with PFT's on return

## 2011-08-23 ENCOUNTER — Other Ambulatory Visit: Payer: Self-pay | Admitting: Internal Medicine

## 2011-08-23 ENCOUNTER — Telehealth: Payer: Self-pay | Admitting: *Deleted

## 2011-08-23 NOTE — Telephone Encounter (Signed)
Message copied by Rosana Berger on Wed Aug 23, 2011  2:46 PM ------      Message from: Christinia Gully B      Created: Mon Aug 21, 2011 11:50 AM       See when she stopped the prednisone ? 05/2011

## 2011-08-23 NOTE — Telephone Encounter (Signed)
LMTCB to find out exactly when she stopped the pred

## 2011-08-24 NOTE — Telephone Encounter (Signed)
Spoke with pt and notified of recs per MW. She verbalized understanding

## 2011-08-24 NOTE — Telephone Encounter (Signed)
Would like her to try then 2.5 mg qod as long as doing well, this will help her with wt loss, with option back to 5 mg qod if any problem with the lower dose

## 2011-08-24 NOTE — Telephone Encounter (Signed)
Spoke with the pt. She now says that she never stopped taking prednisone, she had ran out at one point but is still taking 5 mg every other day.

## 2011-08-29 ENCOUNTER — Emergency Department (HOSPITAL_COMMUNITY)
Admission: EM | Admit: 2011-08-29 | Discharge: 2011-08-29 | Disposition: A | Discharge status: Home / Self Care | Payer: Medicaid Other | Attending: Emergency Medicine | Admitting: Emergency Medicine

## 2011-08-29 ENCOUNTER — Encounter (HOSPITAL_COMMUNITY): Payer: Self-pay | Admitting: Emergency Medicine

## 2011-08-29 DIAGNOSIS — E11319 Type 2 diabetes mellitus with unspecified diabetic retinopathy without macular edema: Secondary | ICD-10-CM | POA: Insufficient documentation

## 2011-08-29 DIAGNOSIS — I10 Essential (primary) hypertension: Secondary | ICD-10-CM | POA: Insufficient documentation

## 2011-08-29 DIAGNOSIS — E1139 Type 2 diabetes mellitus with other diabetic ophthalmic complication: Secondary | ICD-10-CM | POA: Insufficient documentation

## 2011-08-29 DIAGNOSIS — Z79899 Other long term (current) drug therapy: Secondary | ICD-10-CM | POA: Insufficient documentation

## 2011-08-29 DIAGNOSIS — F17200 Nicotine dependence, unspecified, uncomplicated: Secondary | ICD-10-CM | POA: Insufficient documentation

## 2011-08-29 DIAGNOSIS — Z794 Long term (current) use of insulin: Secondary | ICD-10-CM | POA: Insufficient documentation

## 2011-08-29 DIAGNOSIS — D869 Sarcoidosis, unspecified: Secondary | ICD-10-CM | POA: Insufficient documentation

## 2011-08-29 DIAGNOSIS — R040 Epistaxis: Secondary | ICD-10-CM | POA: Insufficient documentation

## 2011-08-29 NOTE — ED Notes (Signed)
Patient sitting on a chair started to have a bloody nose stopped then returned with clots. Patient ax4 denies taking aspirin coumadin or plavix. Airway intact bilateral equal chest rise and fall.

## 2011-08-29 NOTE — Discharge Instructions (Signed)
Use Vaseline in her nose to prevent recurrence of the drying of the mucous membranes and bleeding.  Followup with your Dr. for reevaluation.  Return for worse or uncontrolled symptoms or uncontrolled bleeding.

## 2011-08-30 ENCOUNTER — Encounter (HOSPITAL_COMMUNITY): Payer: Self-pay | Admitting: *Deleted

## 2011-08-30 ENCOUNTER — Emergency Department (HOSPITAL_COMMUNITY)
Admission: EM | Admit: 2011-08-30 | Discharge: 2011-08-30 | Disposition: A | Discharge status: Home / Self Care | Payer: Medicaid Other | Attending: Emergency Medicine | Admitting: Emergency Medicine

## 2011-08-30 DIAGNOSIS — Z79899 Other long term (current) drug therapy: Secondary | ICD-10-CM | POA: Insufficient documentation

## 2011-08-30 DIAGNOSIS — R000 Tachycardia, unspecified: Secondary | ICD-10-CM | POA: Insufficient documentation

## 2011-08-30 DIAGNOSIS — E119 Type 2 diabetes mellitus without complications: Secondary | ICD-10-CM | POA: Insufficient documentation

## 2011-08-30 DIAGNOSIS — I10 Essential (primary) hypertension: Secondary | ICD-10-CM | POA: Insufficient documentation

## 2011-08-30 DIAGNOSIS — R040 Epistaxis: Secondary | ICD-10-CM | POA: Insufficient documentation

## 2011-08-30 DIAGNOSIS — Z794 Long term (current) use of insulin: Secondary | ICD-10-CM | POA: Insufficient documentation

## 2011-08-30 DIAGNOSIS — D869 Sarcoidosis, unspecified: Secondary | ICD-10-CM | POA: Insufficient documentation

## 2011-08-30 LAB — CBC
HCT: 30 % — ABNORMAL LOW (ref 36.0–46.0)
Hemoglobin: 9.8 g/dL — ABNORMAL LOW (ref 12.0–15.0)
MCH: 28.7 pg (ref 26.0–34.0)
MCHC: 32.7 g/dL (ref 30.0–36.0)
MCV: 88 fL (ref 78.0–100.0)
Platelets: 192 10*3/uL (ref 150–400)
RBC: 3.41 MIL/uL — ABNORMAL LOW (ref 3.87–5.11)
RDW: 14.1 % (ref 11.5–15.5)
WBC: 7.2 10*3/uL (ref 4.0–10.5)

## 2011-08-30 LAB — PROTIME-INR
INR: 0.95 (ref 0.00–1.49)
Prothrombin Time: 12.9 seconds (ref 11.6–15.2)

## 2011-08-30 LAB — COMPREHENSIVE METABOLIC PANEL
ALT: 11 U/L (ref 0–35)
AST: 18 U/L (ref 0–37)
Albumin: 2.9 g/dL — ABNORMAL LOW (ref 3.5–5.2)
Alkaline Phosphatase: 104 U/L (ref 39–117)
BUN: 13 mg/dL (ref 6–23)
CO2: 27 mEq/L (ref 19–32)
Calcium: 8.5 mg/dL (ref 8.4–10.5)
Chloride: 94 mEq/L — ABNORMAL LOW (ref 96–112)
Creatinine, Ser: 0.6 mg/dL (ref 0.50–1.10)
GFR calc Af Amer: >90 mL/min (ref >90)
GFR calc non Af Amer: >90 mL/min (ref >90)
Glucose, Bld: 275 mg/dL — ABNORMAL HIGH (ref 70–99)
Potassium: 3 mEq/L — ABNORMAL LOW (ref 3.5–5.1)
Sodium: 133 mEq/L — ABNORMAL LOW (ref 135–145)
Total Bilirubin: 0.3 mg/dL (ref 0.3–1.2)
Total Protein: 7.3 g/dL (ref 6.0–8.3)

## 2011-08-30 MED ORDER — COCAINE HCL 4 % EX SOLN
CUTANEOUS | Status: AC
  Administered 2011-08-30: 2 mL via NASAL
  Filled 2011-08-30: qty 4

## 2011-08-30 MED ORDER — OXYMETAZOLINE HCL 0.05 % NA SOLN
NASAL | Status: AC
  Administered 2011-08-30: 2
  Filled 2011-08-30: qty 15

## 2011-08-30 NOTE — Discharge Instructions (Signed)

## 2011-08-30 NOTE — ED Notes (Signed)
Bleeding remains controlled at this time, pt remains alert & oriented x 4, pt remains pain free, will continue to monitor

## 2011-08-30 NOTE — ED Notes (Signed)
Bleeding controlled at this time, pt tolerated procedure well, pt denies pain, will continue to monitor

## 2011-08-30 NOTE — ED Notes (Signed)
Pt's family at bedside, pt remains A&O x4, pt denies dizziness & blurred vision, pt remains pain free, will continue to monitor, NAD

## 2011-08-30 NOTE — ED Notes (Signed)
4% Cocaine Topical solution given at 12:42

## 2011-08-30 NOTE — ED Provider Notes (Signed)
History     CSN: [id number]  Arrival date & time 08/29/11  1536   First MD Initiated Contact with Patient 08/29/11 1715      Chief Complaint  Patient presents with  . Epistaxis    (Consider location/radiation/quality/duration/timing/severity/associated sxs/prior treatment) HPI The patient is a 41 year old, female, uses oxygen at night.  She presents to emergency department complaining of uncontrolled epistaxis.  She says that she had significant bleeding and blood clots in her nose.  She denies lightheadedness, or shortness of breath.  She denies recent URI symptoms.  She is not on any anticoagulants.  Presently, the bleeding has stopped and she is asymptomatic Past Medical History  Diagnosis Date  . Sarcoidosis     Followed by Dr. Melvyn Novas; w/ liver involvement per biopsy 12/09, Reversible airway component so started on [facility] 01/2010; HFA 75% p coaching 05/2010  . Hypoxemia     CT angiogram 9/11>> No PE; PFTs 10/11- FEV1 1.20 (49%) with 16% better p B2, DLCO 33%> corrects to 84; O2 sats ok on 4 lpm X rapid walk X 3 laps 05/2010  . Morbid obesity     Target wt= 153 for BMI <30  . QT prolongation   . Diabetes mellitus   . Hypertension   . Hx of cardiac cath 2/08    No CAD, no RAS,  normal EF  . Seborrheic dermatitis of scalp   . 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 Hep B serology neg. but Hep C ab +, HIV neg. 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 Cardiomyopathy, nonischemic     EF 45% 12/10; Echo 7/11 normal EF, PAS 48  . Diabetic retinopathy     Right eye 2/11  . Health maintenance examination     Mammogram 05/2010 Negative; Last Pap smear 03/2008; Last DM eye exam 2/11> mild non-proliferative diabetic retinopathy. OD  . Helicobacter pylori ab+ 05/2011    Pt was symptomatic and treatment planned for 05/2011    Past Surgical History  Procedure Date  . Tubal ligation   . Breast surgery   .  Cesarean section     Family History  Problem Relation Age of Onset  . Cancer Mother     colon cancer  . Multiple sclerosis Father   . Asthma Sister     in childhood  . Diabetes Father   . Hypertension      History  Substance Use Topics  . Smoking status: Current Everyday Smoker -- 0.2 packs/day for 20 years    Types: Cigarettes  . Smokeless tobacco: Never Used  . Alcohol Use: Yes     rare    OB History    Grav Para Term Preterm Abortions TAB SAB Ect Mult Living                  Review of Systems  Constitutional: Negative for fever and chills.  HENT: Positive for nosebleeds.   Respiratory: Negative for cough and shortness of breath.   Gastrointestinal: Negative for nausea and vomiting.  Neurological: Negative for weakness and headaches.  Hematological: Does not bruise/bleed easily.  Psychiatric/Behavioral: Negative for confusion.  All other systems reviewed and are negative.    Allergies  Vicodin  Home Medications   Current Outpatient Rx  Name Route Sig Dispense Refill  . CARVEDILOL 6.25 MG PO TABS Oral Take 6.25 mg by mouth 2 (two) times daily.      Marland Kitchen  FUROSEMIDE 40 MG PO TABS Oral Take 40 mg by mouth daily.    . INSULIN ISOPHANE & REGULAR (70-30) 100 UNIT/ML [place] SUSP Subcutaneous Inject 25 Units into the skin 2 (two) times daily before a meal.      . LISINOPRIL 20 MG PO TABS Oral Take 20 mg by mouth daily.    Marland Kitchen METFORMIN HCL 500 MG PO TABS Oral Take 1,000 mg by mouth 2 (two) times daily with a meal.    . OMEPRAZOLE 20 MG PO CPDR Oral Take 40 mg by mouth 2 (two) times daily.    Marland Kitchen POTASSIUM CHLORIDE CRYS ER 20 MEQ PO TBCR Oral Take 20 mEq by mouth daily.    Marland Kitchen VARENICLINE TARTRATE 1 MG PO TABS Oral Take 1 mg by mouth 2 (two) times daily.      BP 183/93  Pulse 66  Temp(Src) 98.6 F (37 C) (Oral)  Resp 20  SpO2 97%  LMP 07/22/2011  Physical Exam  Vitals reviewed. Constitutional: She is oriented to person, place, and time.       Morbidly obese  HENT:    Head: Normocephalic and atraumatic.  Mouth/Throat: Oropharynx is clear and moist.       I had the patient.  Blow from both nares.  Large clots were expelled from both nostrils.  Reevaluation after 30 minutes.  Demonstrated.  No recurrent bleeding  Eyes: Conjunctivae are normal.  Neck: Normal range of motion. Neck supple.  Pulmonary/Chest: Effort normal.  Musculoskeletal: Normal range of motion.  Neurological: She is alert and oriented to person, place, and time.  Skin: Skin is warm and dry.  Psychiatric: She has a normal mood and affect. Thought content normal.    ED Course  Procedures (including critical care time) Epistaxis, resolved.  No signs or symptoms to suggest significant volume loss from her bleeding. The patient uses oxygen at night time.  I suspect that this has caused crying of the mucous membranes in her nose resulting in friable, blood vessels, and tissue, and subsequent bleeding.  Labs Reviewed - No data to display No results found.   No diagnosis found.    MDM  Epistaxis, resolved.  No evidence of significant volume loss.  Patient is asymptomatic after a period of observation in the emergency department without recurrence of bleeding        Barbara Cower, MD 08/30/11 [phone]

## 2011-08-30 NOTE — ED Notes (Signed)
Attempted to have pt pinch her nose and lean her head back.  Pt states that blood is running down her throat and vomited about 50cc frank blood.  Pt then blew her nose releasing some frank blood and blood clots.  Pt moved to back in ER in [facility]

## 2011-08-30 NOTE — ED Notes (Signed)
Pt has a nosebleed which began yesterday.  Bleeding stopped yesterday and began again about 15 minutes ago.  Pt is spitting up blood and blood is dripping from her nose.  Pt has had some dizziness with this.  Pt is alert and oriented.  Pt denies being on any blood thinners.  Pt is unsure how much blood she has lost but states that she has lost "a lot"

## 2011-08-30 NOTE — ED Provider Notes (Signed)
History     CSN: [id number]  Arrival date & time 08/30/11  1223   First MD Initiated Contact with Patient 08/30/11 1244      Chief Complaint  Patient presents with  . Epistaxis    (Consider location/radiation/quality/duration/timing/severity/associated sxs/prior treatment) HPI Comments: Patient presents with nosebleed that started about 20 minutes ago. She denies any trauma. She is not on any anticoagulants. She seen yesterday for similar complaints and discharge with minimal intervention. She said the bleeding stopped last night to recur while she was sitting at home. She uses oxygen for sarcoidosis. Denies chest pain, shortness of breath, lightheadedness.  The history is provided by the patient.    Past Medical History  Diagnosis Date  . Sarcoidosis     Followed by Dr. Melvyn Novas; w/ liver involvement per biopsy 12/09, Reversible airway component so started on [facility] 01/2010; HFA 75% p coaching 05/2010  . Hypoxemia     CT angiogram 9/11>> No PE; PFTs 10/11- FEV1 1.20 (49%) with 16% better p B2, DLCO 33%> corrects to 84; O2 sats ok on 4 lpm X rapid walk X 3 laps 05/2010  . Morbid obesity     Target wt= 153 for BMI <30  . QT prolongation   . Diabetes mellitus   . Hypertension   . Hx of cardiac cath 2/08    No CAD, no RAS,  normal EF  . Seborrheic dermatitis of scalp   . 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 Hep B serology neg. but Hep C ab +, HIV neg. 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 Cardiomyopathy, nonischemic     EF 45% 12/10; Echo 7/11 normal EF, PAS 48  . Diabetic retinopathy     Right eye 2/11  . Health maintenance examination     Mammogram 05/2010 Negative; Last Pap smear 03/2008; Last DM eye exam 2/11> mild non-proliferative diabetic retinopathy. OD  . Helicobacter pylori ab+ 05/2011    Pt was symptomatic and treatment planned for 05/2011    Past Surgical History  Procedure Date  .  Tubal ligation   . Breast surgery   . Cesarean section     Family History  Problem Relation Age of Onset  . Cancer Mother     colon cancer  . Multiple sclerosis Father   . Asthma Sister     in childhood  . Diabetes Father   . Hypertension      History  Substance Use Topics  . Smoking status: Former Smoker -- 0.0 packs/day for 20 years    Types: Cigarettes  . Smokeless tobacco: Not on file  . Alcohol Use: Yes     rare    OB History    Grav Para Term Preterm Abortions TAB SAB Ect Mult Living                  Review of Systems  Constitutional: Negative for fever and activity change.  HENT: Positive for nosebleeds.   Eyes: Negative for visual disturbance.  Respiratory: Negative for cough, chest tightness and shortness of breath.   Cardiovascular: Negative for chest pain.  Gastrointestinal: Negative for nausea and vomiting.  Neurological: Negative for weakness and light-headedness.    Allergies  Vicodin  Home Medications   Current Outpatient Rx  Name Route Sig Dispense Refill  . CARVEDILOL 6.25 MG PO TABS Oral Take 6.25 mg by mouth 2 (two) times  daily.      . FUROSEMIDE 40 MG PO TABS Oral Take 40 mg by mouth daily.    . INSULIN ISOPHANE & REGULAR (70-30) 100 UNIT/ML [place] SUSP Subcutaneous Inject 25 Units into the skin 2 (two) times daily before a meal.      . LISINOPRIL 20 MG PO TABS Oral Take 20 mg by mouth daily.    Marland Kitchen METFORMIN HCL 500 MG PO TABS Oral Take 1,000 mg by mouth 2 (two) times daily with a meal.    . OMEPRAZOLE 20 MG PO CPDR Oral Take 40 mg by mouth 2 (two) times daily.    Marland Kitchen POTASSIUM CHLORIDE CRYS ER 20 MEQ PO TBCR Oral Take 20 mEq by mouth daily.    Marland Kitchen VARENICLINE TARTRATE 1 MG PO TABS Oral Take 1 mg by mouth 2 (two) times daily.      BP 140/84  Pulse 119  Temp(Src) 98.1 F (36.7 C) (Oral)  Resp 22  SpO2 95%  LMP 08/25/2011  Physical Exam  Constitutional: She is oriented to person, place, and time. She appears well-developed and  well-nourished. She appears distressed.  HENT:  Head: Normocephalic and atraumatic.  Nose: Epistaxis is observed.  Mouth/Throat: Oropharynx is clear and moist. No oropharyngeal exudate.       Patient has active nasal bleeding from both nares. After blowing her nose she expelled large clots from both nostrils. Mucosa appears friable without any specific area of bleeding noted.  Eyes: Conjunctivae and EOM are normal. Pupils are equal, round, and reactive to light.  Neck: Normal range of motion. Neck supple.  Cardiovascular: Normal rate, regular rhythm and normal heart sounds.        Tachycardic  Pulmonary/Chest: Effort normal and breath sounds normal. No respiratory distress.  Abdominal: Soft. There is no tenderness. There is no rebound and no guarding.  Musculoskeletal: Normal range of motion. She exhibits no edema and no tenderness.  Neurological: She is alert and oriented to person, place, and time. No cranial nerve deficit.  Skin: Skin is warm.    ED Course  EPISTAXIS MANAGEMENT Date/Time: 08/30/2011 1:03 PM Performed by: Ezequiel Essex Authorized by: Ezequiel Essex Consent: Verbal consent obtained. The procedure was performed in an emergent situation. Risks and benefits: risks, benefits and alternatives were discussed Consent given by: patient Patient identity confirmed: verbally with patient Local anesthetic: topical anesthetic Patient sedated: no Treatment site: right anterior and left anterior Repair method: cophenylcaine and silver nitrate Post-procedure assessment: bleeding stopped Treatment complexity: simple Recurrence: recurrence of recent bleed Patient tolerance: Patient tolerated the procedure well with no immediate complications. Comments: Silver nitrate to left lateral nare   (including critical care time)  Labs Reviewed  CBC - Abnormal; Notable for the following:    RBC 3.41 (*)    Hemoglobin 9.8 (*)    HCT 30.0 (*)    All other components within normal  limits  COMPREHENSIVE METABOLIC PANEL - Abnormal; Notable for the following:    Sodium 133 (*)    Potassium 3.0 (*)    Chloride 94 (*)    Glucose, Bld 275 (*)    Albumin 2.9 (*)    All other components within normal limits  PROTIME-INR   No results found.   No diagnosis found.    MDM  Epistaxis. No chest pain, lightheadedness.   After clots evacuated, patient was treated with cocaine pledgets. Results all area on the left lateral nasal wall that was cauterized. However there is no focality of patient's bleeding. Tachycardia  improved. Observed for one hour with no additional bleeding.       Ezequiel Essex, MD 08/30/11 [phone]

## 2011-09-03 ENCOUNTER — Emergency Department (HOSPITAL_COMMUNITY)
Admission: EM | Admit: 2011-09-03 | Discharge: 2011-09-03 | Disposition: A | Discharge status: Home / Self Care | Payer: Medicaid Other | Attending: Emergency Medicine | Admitting: Emergency Medicine

## 2011-09-03 ENCOUNTER — Encounter (HOSPITAL_COMMUNITY): Payer: Self-pay | Admitting: Emergency Medicine

## 2011-09-03 DIAGNOSIS — Z794 Long term (current) use of insulin: Secondary | ICD-10-CM | POA: Insufficient documentation

## 2011-09-03 DIAGNOSIS — R040 Epistaxis: Secondary | ICD-10-CM | POA: Insufficient documentation

## 2011-09-03 DIAGNOSIS — Z825 Family history of asthma and other chronic lower respiratory diseases: Secondary | ICD-10-CM | POA: Insufficient documentation

## 2011-09-03 DIAGNOSIS — E11319 Type 2 diabetes mellitus with unspecified diabetic retinopathy without macular edema: Secondary | ICD-10-CM | POA: Insufficient documentation

## 2011-09-03 DIAGNOSIS — Z800 Family history of malignant neoplasm of digestive organs: Secondary | ICD-10-CM | POA: Insufficient documentation

## 2011-09-03 DIAGNOSIS — I10 Essential (primary) hypertension: Secondary | ICD-10-CM | POA: Insufficient documentation

## 2011-09-03 DIAGNOSIS — Z8249 Family history of ischemic heart disease and other diseases of the circulatory system: Secondary | ICD-10-CM | POA: Insufficient documentation

## 2011-09-03 DIAGNOSIS — Z885 Allergy status to narcotic agent status: Secondary | ICD-10-CM | POA: Insufficient documentation

## 2011-09-03 DIAGNOSIS — Z87891 Personal history of nicotine dependence: Secondary | ICD-10-CM | POA: Insufficient documentation

## 2011-09-03 DIAGNOSIS — Z833 Family history of diabetes mellitus: Secondary | ICD-10-CM | POA: Insufficient documentation

## 2011-09-03 DIAGNOSIS — I428 Other cardiomyopathies: Secondary | ICD-10-CM | POA: Insufficient documentation

## 2011-09-03 DIAGNOSIS — E1139 Type 2 diabetes mellitus with other diabetic ophthalmic complication: Secondary | ICD-10-CM | POA: Insufficient documentation

## 2011-09-03 NOTE — ED Provider Notes (Signed)
History     CSN: [id number]  Arrival date & time 09/03/11  0242   First MD Initiated Contact with Patient 09/03/11 0315      Chief Complaint  Patient presents with  . Epistaxis    (Consider location/radiation/quality/duration/timing/severity/associated sxs/prior treatment) HPI Patient presents to the emergency department with complaint of nosebleed. Patient is been seen in the ER twice the last week for similar complaints. She reports that she has had intermittent nosebleeds all week. She's been able to control most of them by pressure. Patient without prior history of nosebleeds. Patient wears oxygen chronically for her history of sarcoidosis. 2 prior ER visits or reviewed. On the first one she had spontaneous cessation without intervention. On the second visit, patient had topical cocaine and silver nitrate application with resolution. Patient is due to followup with ENT but has not yet made the appointment. Patient reports she had a nosebleed tonight before going to bed, but was able to stop it with pressure. She woke up just prior to arrival with profuse bleeding and was unable to get it to stop. Patient is not on any anticoagulants Past Medical History  Diagnosis Date  . Sarcoidosis     Followed by Dr. Melvyn Novas; w/ liver involvement per biopsy 12/09, Reversible airway component so started on [facility] 01/2010; HFA 75% p coaching 05/2010  . Hypoxemia     CT angiogram 9/11>> No PE; PFTs 10/11- FEV1 1.20 (49%) with 16% better p B2, DLCO 33%> corrects to 84; O2 sats ok on 4 lpm X rapid walk X 3 laps 05/2010  . Morbid obesity     Target wt= 153 for BMI <30  . QT prolongation   . Diabetes mellitus   . Hypertension   . Hx of cardiac cath 2/08    No CAD, no RAS,  normal EF  . Seborrheic dermatitis of scalp   . 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 Hep B serology neg. but Hep C ab +, HIV neg. AMA and Hep C viral load neg.; Liver biopsy 12/09 c/w liver sarcoid and  portal fibrosis  . Cardiomyopathy, nonischemic     EF 45% 12/10; Echo 7/11 normal EF, PAS 48  . Diabetic retinopathy     Right eye 2/11  . Health maintenance examination     Mammogram 05/2010 Negative; Last Pap smear 03/2008; Last DM eye exam 2/11> mild non-proliferative diabetic retinopathy. OD  . Helicobacter pylori ab+ 05/2011    Pt was symptomatic and treatment planned for 05/2011    Past Surgical History  Procedure Date  . Tubal ligation   . Breast surgery   . Cesarean section     Family History  Problem Relation Age of Onset  . Cancer Mother     colon cancer  . Multiple sclerosis Father   . Asthma Sister     in childhood  . Diabetes Father   . Hypertension      History  Substance Use Topics  . Smoking status: Former Smoker -- 0.0 packs/day for 20 years    Types: Cigarettes  . Smokeless tobacco: Not on file  . Alcohol Use: Yes     rare    OB History    Grav Para Term Preterm Abortions TAB SAB Ect Mult Living                  Review of Systems  All other systems reviewed and are negative.    Allergies  Vicodin  Home Medications   Current Outpatient Rx  Name Route Sig Dispense Refill  . CARVEDILOL 6.25 MG PO TABS Oral Take 6.25 mg by mouth 2 (two) times daily.      . FUROSEMIDE 40 MG PO TABS Oral Take 40 mg by mouth daily.    . INSULIN ISOPHANE & REGULAR (70-30) 100 UNIT/ML [place] SUSP Subcutaneous Inject 25 Units into the skin 2 (two) times daily before a meal.      . LISINOPRIL 20 MG PO TABS Oral Take 20 mg by mouth daily.    Marland Kitchen METFORMIN HCL 500 MG PO TABS Oral Take 1,000 mg by mouth 2 (two) times daily with a meal.    . OMEPRAZOLE 20 MG PO CPDR Oral Take 40 mg by mouth 2 (two) times daily.    Marland Kitchen POTASSIUM CHLORIDE CRYS ER 20 MEQ PO TBCR Oral Take 20 mEq by mouth daily.    Marland Kitchen VARENICLINE TARTRATE 1 MG PO TABS Oral Take 1 mg by mouth 2 (two) times daily.      BP 133/84  Pulse 112  Temp(Src) 98.3 F (36.8 C) (Oral)  Resp 18  Ht 5\' 1"  (1.549 m)  Wt 217  lb (98.431 kg)  BMI 41.00 kg/m2  SpO2 100%  LMP 08/25/2011  Physical Exam  Nursing note and vitals reviewed. Constitutional: She appears well-developed and well-nourished.  HENT:  Head: Normocephalic and atraumatic.  Right Ear: External ear normal.  Left Ear: External ear normal.  Mouth/Throat: Oropharynx is clear and moist.       Dried blood noted in both nares. Left nostril with several very friable areas, but no active bleeding. Right nostril without source of bleeding noted.  Eyes: Conjunctivae and EOM are normal. Pupils are equal, round, and reactive to light. Right eye exhibits no discharge. Left eye exhibits no discharge.  Neck: Normal range of motion. Neck supple. No JVD present. No tracheal deviation present. No thyromegaly present.  Pulmonary/Chest: Effort normal and breath sounds normal. No stridor. No respiratory distress. She has no wheezes. She has no rales. She exhibits no tenderness.  Abdominal: Soft. Bowel sounds are normal. She exhibits no distension and no mass. There is no tenderness. There is no rebound and no guarding.  Musculoskeletal: Normal range of motion. She exhibits no edema and no tenderness.  Lymphadenopathy:    She has no cervical adenopathy.  Skin: Skin is warm and dry. No rash noted. No erythema. No pallor.    ED Course  Procedures (including critical care time)  Labs Reviewed - No data to display No results found.   1. Epistaxis, recurrent       MDM  41 year old female with began spontaneous resolution of nose bleed. No intervention required at this time. Patient does have history of hypertension, but will give Afrin for the next 3 days. Patient educated on treatment of nose bleed and reasons for return. She is to follow up with Dr. Benjamine Mola tomorrow        Kalman Drape, MD 09/03/11 [phone]

## 2011-09-03 NOTE — Discharge Instructions (Signed)
Please contact DR TEOH's office tomorrow morning for follow up.  Use afrin two sprays in each nostril twice a day for three days.  Apply vaseline ointment inside both nostrils three times a day to help with dryness.    Nosebleed  Should your nose begin bleeding again, hold pressure (HARD!) just below the bony part of your nose.  Hold pressure continuously for 15 minutes.  NO PEEKING!.  If bleeding continues, blow your nose to get rid of any clots.  Spray afrin x 2 into affected nostril.  Then apply pressure again, this time for 30 minutes.  Should your nose continue to bleed after this time, return to the ER for further treatment.  EPISTAXIS - WITHOUT PACKING  EPISTAXIS: You have been seen for Epistaxis (bloody nose).  Epistaxis in the medical term for a bloody nose. Epistaxis is a common condition and although frightening, it seldom becomes serious. The skin in the front of the nose is very fragile and in children is most often damaged by direct trauma (such as nose-picking) or when the skin becomes very dry or irritated (such as in dry environments on when you have a cold).  The bleeding often begins at the end of the nose, so pinching the entire nose for 15 minutes is often effective at stopping the bleeding.  When the bleeding area can be found, the doctor may will use a chemical called Silver Nitrate to stop the bleeding.  Other chemicals are sometimes used to stop the bleeding by causing the oozing blood to clot.  You may have had either or both of these treatments done today.  If the bleeding does not stop with chemical treatment, packing may be placed into the nose.  Although packing is sometimes uncomfortable, it is usually very effective at stopping a bloody nose.  Sometimes the bleeding starts from a blood vessel way in the back of the nose or in the throat. This type of bleeding is difficult to control and often requires nasal packing by an Ear, Nose and Throat specialist.  Applying ice  to the forehead or back of the neck is not effective and WILL NOT stop the bleeding. Instead, press or squeeze the nose directly.  If the bleeding doesnt stop in 15 minutes, return here or go to the nearest Emergency Department.  Avoid aspirin and non-steroidal anti-inflammatory medications (Advil, Motrin, Ibuprofen, Aleve, Naprosyn, etc.) and alcohol. These medications will make it difficult for your blood to clot.  Avoid blowing your nose, sneezing and straining for the next several days.  YOU SHOULD SEEK MEDICAL ATTENTION IMMEDIATELY, EITHER HERE OR AT THE NEAREST EMERGENCY DEPARTMENT, IF ANY OF THE FOLLOWING OCCURS:      You are unable to stop the bleeding with direct pressure.     You experience bleeding down the back of the throat.   If you develop symptoms of Shortness of Breath, Chest Pain, Swelling of lips, mouth or tongue or if your condition becomes worse with any new symptoms, see your doctor or return to the Emergency Department for immediate care. Emergency services are not intended to be a substitute for comprehensive medical attention.  Please contact your doctor for follow up if not improving as expected.   Call your doctor in 5-7 days or as directed if there is no improvement.

## 2011-09-03 NOTE — ED Notes (Signed)
Patient is AOx4 and comfortable with her discharge instructions. 

## 2011-09-03 NOTE — ED Notes (Signed)
Patient complaining of nosebleed that started around 0200 this morning; patient bleeding from nose and eyes.  Patient was seen here this past Monday and Wednesday for the same symptoms (bleeding from nose and eyes).  Patient denies bleeding from anywhere else.  Patient denies being on blood thinners.

## 2011-09-04 ENCOUNTER — Encounter (HOSPITAL_COMMUNITY): Payer: Self-pay | Admitting: Adult Health

## 2011-09-04 ENCOUNTER — Inpatient Hospital Stay (HOSPITAL_COMMUNITY)
Admission: EM | Admit: 2011-09-04 | Discharge: 2011-09-05 | DRG: 812 | Disposition: A | Discharge status: Home / Self Care | Payer: Medicaid Other | Source: Ambulatory Visit | Attending: Emergency Medicine | Admitting: Emergency Medicine

## 2011-09-04 DIAGNOSIS — R040 Epistaxis: Secondary | ICD-10-CM | POA: Diagnosis present

## 2011-09-04 DIAGNOSIS — D62 Acute posthemorrhagic anemia: Secondary | ICD-10-CM

## 2011-09-04 DIAGNOSIS — E11319 Type 2 diabetes mellitus with unspecified diabetic retinopathy without macular edema: Secondary | ICD-10-CM | POA: Diagnosis present

## 2011-09-04 DIAGNOSIS — E1139 Type 2 diabetes mellitus with other diabetic ophthalmic complication: Secondary | ICD-10-CM | POA: Diagnosis present

## 2011-09-04 DIAGNOSIS — Z87891 Personal history of nicotine dependence: Secondary | ICD-10-CM

## 2011-09-04 DIAGNOSIS — Z9981 Dependence on supplemental oxygen: Secondary | ICD-10-CM

## 2011-09-04 DIAGNOSIS — D869 Sarcoidosis, unspecified: Secondary | ICD-10-CM | POA: Diagnosis present

## 2011-09-04 LAB — DIFFERENTIAL
Basophils Absolute: 0 10*3/uL (ref 0.0–0.1)
Basophils Relative: 0 % (ref 0–1)
Eosinophils Absolute: 0.5 10*3/uL (ref 0.0–0.7)
Eosinophils Relative: 4 % (ref 0–5)
Lymphocytes Relative: 28 % (ref 12–46)
Lymphs Abs: 3.4 10*3/uL (ref 0.7–4.0)
Monocytes Absolute: 0.9 10*3/uL (ref 0.1–1.0)
Monocytes Relative: 7 % (ref 3–12)
Neutro Abs: 7.4 10*3/uL (ref 1.7–7.7)
Neutrophils Relative %: 61 % (ref 43–77)

## 2011-09-04 LAB — BASIC METABOLIC PANEL
BUN: 10 mg/dL (ref 6–23)
CO2: 26 mEq/L (ref 19–32)
Calcium: 8.8 mg/dL (ref 8.4–10.5)
Chloride: 100 mEq/L (ref 96–112)
Creatinine, Ser: 0.74 mg/dL (ref 0.50–1.10)
GFR calc Af Amer: >90 mL/min (ref >90)
GFR calc non Af Amer: >90 mL/min (ref >90)
Glucose, Bld: 223 mg/dL — ABNORMAL HIGH (ref 70–99)
Potassium: 3.5 mEq/L (ref 3.5–5.1)
Sodium: 137 mEq/L (ref 135–145)

## 2011-09-04 LAB — CBC
HCT: 21.4 % — ABNORMAL LOW (ref 36.0–46.0)
Hemoglobin: 7 g/dL — ABNORMAL LOW (ref 12.0–15.0)
MCH: 28.8 pg (ref 26.0–34.0)
MCHC: 32.7 g/dL (ref 30.0–36.0)
MCV: 88.1 fL (ref 78.0–100.0)
Platelets: 270 10*3/uL (ref 150–400)
RBC: 2.43 MIL/uL — ABNORMAL LOW (ref 3.87–5.11)
RDW: 15.4 % (ref 11.5–15.5)
WBC: 12.2 10*3/uL — ABNORMAL HIGH (ref 4.0–10.5)

## 2011-09-04 LAB — PREPARE RBC (CROSSMATCH)

## 2011-09-04 MED ORDER — SODIUM CHLORIDE 0.9 % IV SOLN: INTRAVENOUS | Status: DC

## 2011-09-04 NOTE — ED Notes (Signed)
Report called to [facility] ed charge rn

## 2011-09-04 NOTE — ED Notes (Addendum)
Right nare packed by Dr. Benjamine Mola today in his office and pt c/o bloody drainage still. Pt wants surgery done to fix blood vessel in nose.  Packing bloody, no drainage noted at this time.  Pt c/o weakness and dizziness

## 2011-09-04 NOTE — ED Notes (Signed)
Th pt has started bleeding from the lt nostril

## 2011-09-04 NOTE — ED Notes (Signed)
No bleeding from the nose at present

## 2011-09-04 NOTE — ED Provider Notes (Addendum)
History     CSN: [id number]  Arrival date & time 09/04/11  1827   First MD Initiated Contact with Patient 09/04/11 2042      Chief Complaint  Patient presents with  . Epistaxis    (Consider location/radiation/quality/duration/timing/severity/associated sxs/prior treatment) HPI Pt with history of sarcoidosis who wears chronic nasal canula oxygen has had intermittent nose bleeds for the last week, seen in the ED three times prior to today for same, but has spontaneous resolution or control without packing each visit. She was seen today by Dr. Benjamine Mola who packed her R nare and told her there was a blood vessel in there that was the source of her bleeding. She reports at home she has had two self limited episodes of bleeding and one in the waiting room. She is currently not bleeding. She states that her BP has not been elevated at home today.   Past Medical History  Diagnosis Date  . Sarcoidosis     Followed by Dr. Melvyn Novas; w/ liver involvement per biopsy 12/09, Reversible airway component so started on [facility] 01/2010; HFA 75% p coaching 05/2010  . Hypoxemia     CT angiogram 9/11>> No PE; PFTs 10/11- FEV1 1.20 (49%) with 16% better p B2, DLCO 33%> corrects to 84; O2 sats ok on 4 lpm X rapid walk X 3 laps 05/2010  . Morbid obesity     Target wt= 153 for BMI <30  . QT prolongation   . Diabetes mellitus   . Hypertension   . Hx of cardiac cath 2/08    No CAD, no RAS,  normal EF  . Seborrheic dermatitis of scalp   . 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 Hep B serology neg. but Hep C ab +, HIV neg. 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 Cardiomyopathy, nonischemic     EF 45% 12/10; Echo 7/11 normal EF, PAS 48  . Diabetic retinopathy     Right eye 2/11  . Health maintenance examination     Mammogram 05/2010 Negative; Last Pap smear 03/2008; Last DM eye exam 2/11> mild non-proliferative diabetic retinopathy. OD  .  Helicobacter pylori ab+ 05/2011    Pt was symptomatic and treatment planned for 05/2011    Past Surgical History  Procedure Date  . Tubal ligation   . Breast surgery   . Cesarean section     Family History  Problem Relation Age of Onset  . Cancer Mother     colon cancer  . Multiple sclerosis Father   . Asthma Sister     in childhood  . Diabetes Father   . Hypertension      History  Substance Use Topics  . Smoking status: Former Smoker -- 0.0 packs/day for 20 years    Types: Cigarettes  . Smokeless tobacco: Not on file  . Alcohol Use: Yes     rare    OB History    Grav Para Term Preterm Abortions TAB SAB Ect Mult Living                  Review of Systems All other systems reviewed and are negative except as noted in HPI.   Allergies  Vicodin  Home Medications   Current Outpatient Rx  Name Route Sig Dispense Refill  . CARVEDILOL 6.25 MG PO TABS Oral Take 6.25 mg by mouth 2 (two) times daily.      Marland Kitchen  FUROSEMIDE 40 MG PO TABS Oral Take 40 mg by mouth daily.    . INSULIN ISOPHANE & REGULAR (70-30) 100 UNIT/ML [place] SUSP Subcutaneous Inject 25 Units into the skin 2 (two) times daily before a meal.      . LISINOPRIL 20 MG PO TABS Oral Take 20 mg by mouth daily.    Marland Kitchen METFORMIN HCL 500 MG PO TABS Oral Take 1,000 mg by mouth 2 (two) times daily with a meal.    . OMEPRAZOLE 20 MG PO CPDR Oral Take 40 mg by mouth 2 (two) times daily.    Marland Kitchen POTASSIUM CHLORIDE CRYS ER 20 MEQ PO TBCR Oral Take 20 mEq by mouth daily.    Marland Kitchen VARENICLINE TARTRATE 1 MG PO TABS Oral Take 1 mg by mouth 2 (two) times daily.      BP 126/96  Pulse 114  Temp(Src) 99.3 F (37.4 C) (Oral)  Resp 18  SpO2 100%  LMP 08/25/2011  Physical Exam  Nursing note and vitals reviewed. Constitutional: She is oriented to person, place, and time. She appears well-developed and well-nourished.  HENT:  Head: Normocephalic and atraumatic.       R nare packed with gauze, small amount of dried blood in L nare but no  active bleeding and no clots  Eyes: EOM are normal. Pupils are equal, round, and reactive to light.  Neck: Normal range of motion. Neck supple.  Cardiovascular: Normal rate, normal heart sounds and intact distal pulses.   Pulmonary/Chest: Effort normal and breath sounds normal.  Abdominal: Bowel sounds are normal. She exhibits no distension. There is no tenderness.  Musculoskeletal: Normal range of motion. She exhibits no edema and no tenderness.  Neurological: She is alert and oriented to person, place, and time. She has normal strength. No cranial nerve deficit or sensory deficit.  Skin: Skin is warm and dry. No rash noted.  Psychiatric: She has a normal mood and affect.    ED Course  Procedures (including critical care time)  Labs Reviewed  CBC - Abnormal; Notable for the following:    WBC 12.2 (*)    RBC 2.43 (*)    Hemoglobin 7.0 (*)    HCT 21.4 (*)    All other components within normal limits  BASIC METABOLIC PANEL - Abnormal; Notable for the following:    Glucose, Bld 223 (*)    All other components within normal limits  DIFFERENTIAL  PREPARE RBC (CROSSMATCH)  TYPE AND SCREEN   No results found.   No diagnosis found.    MDM  Discussed with Dr. Benjamine Mola who recommends packing L nare if bleeding continues, however patient does not want this done at this time due to discomfort. Dr. Benjamine Mola also mentioned that she has a posterior blood vessel in her R nare which may benefit from embolization in IR. This can be done in the AM if necessary. Her Hgb has dropped considerably in the last 5 days, will admit for transfusion, observation and possible intervention in the morning. OPC to see in the ED.        Charles B. Karle Starch, MD 09/04/11 2223  10:51 PM Pt began to bleed around her packing in her R nare. Discussed with Dr. Barbie Banner who agrees with need for urgent interventional embolization and deferred to the Neuro-inteventionalist. Dr. Estanislado Pandy is out of town and Dr. Bryon Lions is  covering for neuro interventions from [facility]. He has agreed to accept the patient in ED to ED transfer for embolization there. Pt is  aware of the need to transfer and amenable to the plan. Will begin transfusion in the ED.   CRITICAL CARE Performed by: Truddie Hidden   Total critical care time: 50  Critical care time was exclusive of separately billable procedures and treating other patients.  Critical care was necessary to treat or prevent imminent or life-threatening deterioration.  Critical care was time spent personally by me on the following activities: development of treatment plan with patient and/or surrogate as well as nursing, discussions with consultants, evaluation of patient's response to treatment, examination of patient, obtaining history from patient or surrogate, ordering and performing treatments and interventions, ordering and review of laboratory studies, ordering and review of radiographic studies, pulse oximetry and re-evaluation of patient's condition.   Charles B. Karle Starch, MD 09/04/11 2253

## 2011-09-05 LAB — TYPE AND SCREEN
ABO/RH(D): A POS
Antibody Screen: NEGATIVE
Unit division: 0
Unit division: 0

## 2011-09-05 LAB — ABO/RH: ABO/RH(D): A POS

## 2011-09-19 ENCOUNTER — Ambulatory Visit (INDEPENDENT_AMBULATORY_CARE_PROVIDER_SITE_OTHER): Payer: Medicaid Other | Admitting: Internal Medicine

## 2011-09-19 ENCOUNTER — Other Ambulatory Visit: Payer: Self-pay | Admitting: Dietician

## 2011-09-19 ENCOUNTER — Ambulatory Visit (INDEPENDENT_AMBULATORY_CARE_PROVIDER_SITE_OTHER): Payer: Medicaid Other | Admitting: Dietician

## 2011-09-19 ENCOUNTER — Encounter: Payer: Self-pay | Admitting: Internal Medicine

## 2011-09-19 VITALS — BP 141/82 | HR 101 | Temp 98.6°F | Ht 61.0 in | Wt 223.5 lb

## 2011-09-19 DIAGNOSIS — IMO0001 Reserved for inherently not codable concepts without codable children: Secondary | ICD-10-CM

## 2011-09-19 DIAGNOSIS — Z7901 Long term (current) use of anticoagulants: Secondary | ICD-10-CM

## 2011-09-19 DIAGNOSIS — R040 Epistaxis: Secondary | ICD-10-CM

## 2011-09-19 DIAGNOSIS — F17200 Nicotine dependence, unspecified, uncomplicated: Secondary | ICD-10-CM

## 2011-09-19 DIAGNOSIS — I10 Essential (primary) hypertension: Secondary | ICD-10-CM

## 2011-09-19 LAB — POCT GLYCOSYLATED HEMOGLOBIN (HGB A1C): Hemoglobin A1C: 6.3

## 2011-09-19 LAB — GLUCOSE, CAPILLARY: Glucose-Capillary: 207 mg/dL — ABNORMAL HIGH (ref 70–99)

## 2011-09-19 MED ORDER — ACCU-CHEK FASTCLIX LANCETS MISC: 1.0000 | Freq: Three times a day (TID) | Status: DC

## 2011-09-19 MED ORDER — GLUCOSE BLOOD VI STRP: 1.0000 | ORAL_STRIP | Freq: Three times a day (TID) | Status: DC

## 2011-09-19 NOTE — Progress Notes (Signed)
Diabetes Self-Management Training (DSMT)  Follow-Up 1 Visit  09/19/2011 [name], identified by name and date of birth, is a 41 y.o. female with Type 2 Diabetes. Year of diabetes diagnosis: before 2008 Other persons present: spouse/SO- james and is very supportive ASSESSMENT Patient concerns are Monitoring and Weight control.  Labs and medications reveiwed.   Patients belief/attitude about diabetes: Diabetes can be controlled. Self foot exams daily: No Diabetes Complications: Retinopathy Support systems: significant other and children Special needs: able to do math calculations without difficulty Prior DM Education: Yes   Medications See Medications list.  Has adequate knowledge and Is interested in learning more   Exercise Plan Doing ADLs and sedentary activites- admits to fear of passing out from breathing problems beign a barrier for most of day.   Self-Monitoring Medication Nutrition Monitor: gave accu chek nano today Frequency of testing: 1-2 times/day Breakfast: 200s, but often wakes so hungry she eats before she checks  Hyperglycemia: Yes Weekly Hypoglycemia: No- has symptoms of intense hunger at bedtime and arising suspicious for hypoglycemia   Meal Planning Some knowledge and Interested in improving   Assessment comments: provided options for weight loss- patient wants to try carb counting. Back up plan is to address fat intake if needed.     INDIVIDUAL DIABETES EDUCATION PLAN:  Nutrition management Monitoring Acute complication: _______________________________________________________________________  Intervention TOPICS COVERED TODAY:  Nutrition management  Role of diet in the treatment of diabetes and the relationship between the three main macronutritents and blood glucose control. basic weight loss principles, how to determine # of carbs in foods and beverages.  Monitoring  Taught/evaluated SMBG with accu check nano meter. Purpose and frequency  of SMBG. Acute complication  Taught treatment of hypoglycemia .  PATIENTS GOALS/PLAN (copy and paste in patient instructions so patient receives a copy): 1.  Learning Objective:       Able to identify carb foods and carb content of those foods and portions as well as what foods do not contain carbs 2.  Behavioral Objective:         Nutrition: To improve blood glucose control I will follow meal plan of see patient instructions.  Never 0%  Personalized Follow-Up Plan for Ongoing Self Management Support:  [place], friends, family and CDE visits ______________________________________________________________________   Outcomes Expected outcomes: Demonstrated interest in learning.Expect positive changes in lifestyle. Self-care Barriers: Debilitated state due to current medical condition, Lack of material resources Education material provided: yes- calorie king books x 2 Patient to contact team via Phone if problems or questions. Time in: 1630     Time out: 1530 Future DSMT - 2 wks   Naziah Weckerly, Butch Penny

## 2011-09-19 NOTE — Assessment & Plan Note (Signed)
Patient down to one cigarette per day, previously had been one-third pack per day. Had been taking Chantix with good success. Unfortunately develop epistaxis while taking Chantix, and she attributed to epistaxis to that. It is unlikely this was contributing according to her ENT. - Resume Chantix - Try to eliminate any remaining smoking

## 2011-09-19 NOTE — Progress Notes (Signed)
Subjective:     Patient ID: Melody Gray, female   DOB: 10-May-1970, 41 y.o.   MRN: [id number]  HPI Patient is a very pleasant 41 year old woman with an extensive past medical history including sarcoidosis, diabetes, cardiomyopathy, and recent epistaxis who presents for followup.  Patient was hospitalized at [facility] after 4 consecutive ED visits for epistaxis. She underwent embolization, for which details I do not have. She's had no recurrent episodes since. She does have continued right ear fullness with no loss of hearing, tinnitus, or discharge. She was seen by Dr. Benjamine Mola, and ENT, following this procedure, and was felt to be fine.  Seen by Dr. Melvyn Novas for her sarcoid. Recently seen in April. Now on prednisone 2.5 mg every other day. Using oxygen more since her discharge from the hospital after her nasal embolization. Overall breathing okay. Needs PFTs scheduled per Dr. Melvyn Novas.  Seen by Dr. Stanford Breed for her cardiomyopathy. Has followup in June for her cardiomyopathy of undetermined etiology.  Review of Systems As per history of present illness    Objective:   Physical Exam GEN: NAD.  Alert and oriented x 3.  Pleasant, conversant, and cooperative to exam. RESP:  Scant crackles diffusely, trace end expiratory wheezes, good air movement CARDIOVASCULAR: RRR, S1, S2, no m/r/g EXT: warm and dry. No edema in b/l LE     Assessment:         Plan:

## 2011-09-19 NOTE — Patient Instructions (Addendum)
Per Dr. Dorthula Nettles you are to decrease your evening Novolin 70/30 insulin dose to 23 units before dinner.   You meal  Plan is a budget of 225 grams carbohydrate each day.   Suggested amount per meal/snack  Breakfast- 55 grams carbohydrate Lunch- 55 grams carbohydrate Dinner- 55 grams carbohydrate Snack - 55 grams carbohydrate  You may spend your carbohydrates on anthing you have in the house or want.  Let's follow up in 2 weeks.  Talk to Dr. Melvyn Novas about Pulmonary rehab here at [facility].

## 2011-09-19 NOTE — Assessment & Plan Note (Signed)
BP Readings from Last 3 Encounters:  09/19/11 141/82  09/04/11 123/69  09/03/11 133/84   Patient has been at goal recently. - Continue Coreg, lisinopril

## 2011-09-19 NOTE — Assessment & Plan Note (Signed)
Meeting with Butch Penny today for discussion about meter and general diabetes checking. Does state she is compliant with her insulin. No A1c today. Lipids are controlled. No aspirin given age. - Can get urine microalbumin at followup appointment - A1c at next visit - Continue metformin - Continue insulin 70/30

## 2011-09-19 NOTE — Assessment & Plan Note (Addendum)
Patient underwent embolization procedure at [facility]. Apparently she was intubated following the procedure, got a blood transfusion, and spent 6 days in the hospital.  She feels much better, with only some residual throat soreness from the intubation. Her energy level and breathing are good. We are obtaining the records from this encounter. No further epistaxis since. Patient has seen her ENT Dr Benjamine Mola following the procedure, but complains of continued right ear fullness. The right TM appears perforated, though I am unsure if this is an acute or chronic problem. The left TM appears to have a slight crescent of blood surrounding the inferior portion, though she has no complaints the left. I suspect his right ear fullness is a benign problem, possibly related to seasonal allergies versus sinus congestion, but because of the proximity to an invasive procedure we will like her to be seen once more by her ENT. I have scheduled her for June 7, and she will attend if her symptoms continue to persist.

## 2011-09-21 ENCOUNTER — Other Ambulatory Visit: Payer: Self-pay | Admitting: Internal Medicine

## 2011-09-21 MED ORDER — INSULIN ASPART PROT & ASPART (70-30 MIX) 100 UNIT/ML ~~LOC~~ SUSP: 25.0000 [IU] | Freq: Two times a day (BID) | SUBCUTANEOUS | Status: DC

## 2011-10-03 ENCOUNTER — Other Ambulatory Visit: Payer: Self-pay | Admitting: *Deleted

## 2011-10-04 ENCOUNTER — Encounter: Payer: Self-pay | Admitting: Dietician

## 2011-10-04 MED ORDER — INSULIN PEN NEEDLE 31G X 5 MM MISC: Status: DC

## 2011-10-12 ENCOUNTER — Encounter: Payer: Self-pay | Admitting: Dietician

## 2011-10-17 ENCOUNTER — Ambulatory Visit: Payer: Self-pay | Admitting: Cardiology

## 2011-10-17 ENCOUNTER — Other Ambulatory Visit: Payer: Self-pay

## 2011-10-18 ENCOUNTER — Telehealth: Payer: Self-pay | Admitting: Dietician

## 2011-10-18 DIAGNOSIS — E139 Other specified diabetes mellitus without complications: Secondary | ICD-10-CM

## 2011-10-18 NOTE — Telephone Encounter (Addendum)
Refilled metformin.  Will refer to [facility].  Requests metformin rx and that we make referral for eye doctor appointment and make appointment for her at [facility], has not been since 2011.

## 2011-10-19 MED ORDER — METFORMIN HCL 500 MG PO TABS: 1000.0000 mg | ORAL_TABLET | Freq: Two times a day (BID) | ORAL | Status: DC

## 2011-10-19 NOTE — Telephone Encounter (Signed)
Hi Lela-  Can we set up Melody Gray with an ophto f/u appt.  She last saw [facility] in 2011 for DM checkup.  Thanks, [employer]

## 2011-11-09 ENCOUNTER — Encounter (HOSPITAL_COMMUNITY): Payer: Self-pay | Admitting: *Deleted

## 2011-11-09 ENCOUNTER — Emergency Department (HOSPITAL_COMMUNITY): Payer: Medicare Other

## 2011-11-09 ENCOUNTER — Emergency Department (HOSPITAL_COMMUNITY): Payer: Medicare Other | Admitting: *Deleted

## 2011-11-09 ENCOUNTER — Inpatient Hospital Stay (HOSPITAL_COMMUNITY): Payer: Medicare Other

## 2011-11-09 ENCOUNTER — Encounter (HOSPITAL_COMMUNITY)
Admission: EM | Disposition: A | Discharge status: Other Acute Inpatient Hospital | Payer: Self-pay | Source: Home / Self Care | Attending: Pulmonary Disease

## 2011-11-09 ENCOUNTER — Encounter (HOSPITAL_COMMUNITY): Payer: Self-pay

## 2011-11-09 ENCOUNTER — Ambulatory Visit: Admit: 2011-11-09 | Payer: Self-pay | Admitting: Interventional Radiology

## 2011-11-09 ENCOUNTER — Inpatient Hospital Stay (HOSPITAL_COMMUNITY)
Admission: EM | Admit: 2011-11-09 | Discharge: 2011-11-11 | DRG: 981 | Disposition: A | Discharge status: Other Acute Inpatient Hospital | Payer: Medicare Other | Attending: Pulmonary Disease | Admitting: Pulmonary Disease

## 2011-11-09 DIAGNOSIS — Z4682 Encounter for fitting and adjustment of non-vascular catheter: Secondary | ICD-10-CM | POA: Diagnosis not present

## 2011-11-09 DIAGNOSIS — Z794 Long term (current) use of insulin: Secondary | ICD-10-CM

## 2011-11-09 DIAGNOSIS — D638 Anemia in other chronic diseases classified elsewhere: Secondary | ICD-10-CM | POA: Diagnosis present

## 2011-11-09 DIAGNOSIS — I10 Essential (primary) hypertension: Secondary | ICD-10-CM | POA: Diagnosis present

## 2011-11-09 DIAGNOSIS — I5032 Chronic diastolic (congestive) heart failure: Secondary | ICD-10-CM | POA: Diagnosis present

## 2011-11-09 DIAGNOSIS — K219 Gastro-esophageal reflux disease without esophagitis: Secondary | ICD-10-CM | POA: Diagnosis present

## 2011-11-09 DIAGNOSIS — I509 Heart failure, unspecified: Secondary | ICD-10-CM | POA: Diagnosis present

## 2011-11-09 DIAGNOSIS — J9589 Other postprocedural complications and disorders of respiratory system, not elsewhere classified: Secondary | ICD-10-CM | POA: Diagnosis not present

## 2011-11-09 DIAGNOSIS — N039 Chronic nephritic syndrome with unspecified morphologic changes: Secondary | ICD-10-CM

## 2011-11-09 DIAGNOSIS — E11319 Type 2 diabetes mellitus with unspecified diabetic retinopathy without macular edema: Secondary | ICD-10-CM | POA: Diagnosis present

## 2011-11-09 DIAGNOSIS — Z09 Encounter for follow-up examination after completed treatment for conditions other than malignant neoplasm: Secondary | ICD-10-CM | POA: Diagnosis not present

## 2011-11-09 DIAGNOSIS — D869 Sarcoidosis, unspecified: Secondary | ICD-10-CM

## 2011-11-09 DIAGNOSIS — I428 Other cardiomyopathies: Secondary | ICD-10-CM | POA: Diagnosis present

## 2011-11-09 DIAGNOSIS — E662 Morbid (severe) obesity with alveolar hypoventilation: Secondary | ICD-10-CM | POA: Diagnosis present

## 2011-11-09 DIAGNOSIS — F17200 Nicotine dependence, unspecified, uncomplicated: Secondary | ICD-10-CM | POA: Diagnosis present

## 2011-11-09 DIAGNOSIS — IMO0002 Reserved for concepts with insufficient information to code with codable children: Secondary | ICD-10-CM

## 2011-11-09 DIAGNOSIS — R040 Epistaxis: Principal | ICD-10-CM

## 2011-11-09 DIAGNOSIS — E669 Obesity, unspecified: Secondary | ICD-10-CM | POA: Diagnosis not present

## 2011-11-09 DIAGNOSIS — D631 Anemia in chronic kidney disease: Secondary | ICD-10-CM

## 2011-11-09 DIAGNOSIS — N189 Chronic kidney disease, unspecified: Secondary | ICD-10-CM | POA: Diagnosis present

## 2011-11-09 DIAGNOSIS — E1139 Type 2 diabetes mellitus with other diabetic ophthalmic complication: Secondary | ICD-10-CM | POA: Diagnosis present

## 2011-11-09 DIAGNOSIS — J9620 Acute and chronic respiratory failure, unspecified whether with hypoxia or hypercapnia: Secondary | ICD-10-CM | POA: Diagnosis not present

## 2011-11-09 DIAGNOSIS — Z79899 Other long term (current) drug therapy: Secondary | ICD-10-CM | POA: Diagnosis not present

## 2011-11-09 DIAGNOSIS — E1165 Type 2 diabetes mellitus with hyperglycemia: Secondary | ICD-10-CM | POA: Diagnosis present

## 2011-11-09 DIAGNOSIS — J99 Respiratory disorders in diseases classified elsewhere: Secondary | ICD-10-CM | POA: Diagnosis present

## 2011-11-09 DIAGNOSIS — Z6839 Body mass index (BMI) 39.0-39.9, adult: Secondary | ICD-10-CM | POA: Diagnosis not present

## 2011-11-09 DIAGNOSIS — I499 Cardiac arrhythmia, unspecified: Secondary | ICD-10-CM | POA: Diagnosis not present

## 2011-11-09 DIAGNOSIS — E119 Type 2 diabetes mellitus without complications: Secondary | ICD-10-CM | POA: Diagnosis present

## 2011-11-09 LAB — COMPREHENSIVE METABOLIC PANEL
ALT: 12 U/L (ref 0–35)
AST: 20 U/L (ref 0–37)
Albumin: 3 g/dL — ABNORMAL LOW (ref 3.5–5.2)
Alkaline Phosphatase: 152 U/L — ABNORMAL HIGH (ref 39–117)
BUN: 12 mg/dL (ref 6–23)
CO2: 29 mEq/L (ref 19–32)
Calcium: 9.3 mg/dL (ref 8.4–10.5)
Chloride: 96 mEq/L (ref 96–112)
Creatinine, Ser: 0.58 mg/dL (ref 0.50–1.10)
GFR calc Af Amer: >90 mL/min (ref >90)
GFR calc non Af Amer: >90 mL/min (ref >90)
Glucose, Bld: 232 mg/dL — ABNORMAL HIGH (ref 70–99)
Potassium: 3.6 mEq/L (ref 3.5–5.1)
Sodium: 136 mEq/L (ref 135–145)
Total Bilirubin: 0.3 mg/dL (ref 0.3–1.2)
Total Protein: 7.8 g/dL (ref 6.0–8.3)

## 2011-11-09 LAB — DIFFERENTIAL
Basophils Absolute: 0 10*3/uL (ref 0.0–0.1)
Basophils Relative: 0 % (ref 0–1)
Eosinophils Absolute: 0.3 10*3/uL (ref 0.0–0.7)
Eosinophils Relative: 5 % (ref 0–5)
Lymphocytes Relative: 39 % (ref 12–46)
Lymphs Abs: 2.5 10*3/uL (ref 0.7–4.0)
Monocytes Absolute: 0.7 10*3/uL (ref 0.1–1.0)
Monocytes Relative: 11 % (ref 3–12)
Neutro Abs: 2.8 10*3/uL (ref 1.7–7.7)
Neutrophils Relative %: 44 % (ref 43–77)

## 2011-11-09 LAB — GLUCOSE, CAPILLARY
Glucose-Capillary: 193 mg/dL — ABNORMAL HIGH (ref 70–99)
Glucose-Capillary: 137 mg/dL — ABNORMAL HIGH (ref 70–99)
Glucose-Capillary: 148 mg/dL — ABNORMAL HIGH (ref 70–99)
Glucose-Capillary: 131 mg/dL — ABNORMAL HIGH (ref 70–99)

## 2011-11-09 LAB — CBC WITH DIFFERENTIAL/PLATELET
Basophils Absolute: 0 10*3/uL (ref 0.0–0.1)
Basophils Relative: 0 % (ref 0–1)
Eosinophils Absolute: 0.4 10*3/uL (ref 0.0–0.7)
Eosinophils Relative: 5 % (ref 0–5)
HCT: 31 % — ABNORMAL LOW (ref 36.0–46.0)
Hemoglobin: 9.8 g/dL — ABNORMAL LOW (ref 12.0–15.0)
Lymphocytes Relative: 27 % (ref 12–46)
Lymphs Abs: 1.9 10*3/uL (ref 0.7–4.0)
MCH: 26.3 pg (ref 26.0–34.0)
MCHC: 31.6 g/dL (ref 30.0–36.0)
MCV: 83.1 fL (ref 78.0–100.0)
Monocytes Absolute: 0.7 10*3/uL (ref 0.1–1.0)
Monocytes Relative: 9 % (ref 3–12)
Neutro Abs: 4.1 10*3/uL (ref 1.7–7.7)
Neutrophils Relative %: 58 % (ref 43–77)
Platelets: 204 10*3/uL (ref 150–400)
RBC: 3.73 MIL/uL — ABNORMAL LOW (ref 3.87–5.11)
RDW: 15 % (ref 11.5–15.5)
WBC: 7.1 10*3/uL (ref 4.0–10.5)

## 2011-11-09 LAB — CBC
HCT: 27.7 % — ABNORMAL LOW (ref 36.0–46.0)
Hemoglobin: 8.7 g/dL — ABNORMAL LOW (ref 12.0–15.0)
MCH: 25.9 pg — ABNORMAL LOW (ref 26.0–34.0)
MCHC: 31.4 g/dL (ref 30.0–36.0)
MCV: 82.4 fL (ref 78.0–100.0)
Platelets: 174 10*3/uL (ref 150–400)
RBC: 3.36 MIL/uL — ABNORMAL LOW (ref 3.87–5.11)
RDW: 15.2 % (ref 11.5–15.5)
WBC: 6.3 10*3/uL (ref 4.0–10.5)

## 2011-11-09 LAB — POCT I-STAT 3, ART BLOOD GAS (G3+)
Acid-Base Excess: 5 mmol/L — ABNORMAL HIGH (ref 0.0–2.0)
Bicarbonate: 33.1 mEq/L — ABNORMAL HIGH (ref 20.0–24.0)
O2 Saturation: 100 %
Patient temperature: 98
TCO2: 35 mmol/L (ref 0–100)
pCO2 arterial: 67.5 mmHg (ref 35.0–45.0)
pH, Arterial: 7.297 — ABNORMAL LOW (ref 7.350–7.450)
pO2, Arterial: 327 mmHg — ABNORMAL HIGH (ref 80.0–100.0)

## 2011-11-09 LAB — PROTIME-INR
INR: 0.96 (ref 0.00–1.49)
Prothrombin Time: 13 seconds (ref 11.6–15.2)

## 2011-11-09 LAB — APTT: aPTT: 38 seconds — ABNORMAL HIGH (ref 24–37)

## 2011-11-09 SURGERY — RADIOLOGY WITH ANESTHESIA
Anesthesia: General

## 2011-11-09 MED ORDER — ASPIRIN 81 MG PO CHEW: 324.0000 mg | CHEWABLE_TABLET | ORAL | Status: DC

## 2011-11-09 MED ORDER — MIDAZOLAM HCL 2 MG/2ML IJ SOLN: 2.0000 mg | INTRAMUSCULAR | Status: DC | PRN

## 2011-11-09 MED ORDER — ROCURONIUM BROMIDE 100 MG/10ML IV SOLN
INTRAVENOUS | Status: DC | PRN
  Administered 2011-11-09: 50 mg via INTRAVENOUS

## 2011-11-09 MED ORDER — DEXMEDETOMIDINE HCL IN NACL 200 MCG/50ML IV SOLN
0.4000 ug/kg/h | INTRAVENOUS | Status: DC
  Filled 2011-11-09: qty 50

## 2011-11-09 MED ORDER — SODIUM CHLORIDE 0.9 % IV SOLN
200.0000 ug | INTRAVENOUS | Status: DC | PRN
  Administered 2011-11-09: 0.4 ug/kg/h via INTRAVENOUS

## 2011-11-09 MED ORDER — SODIUM CHLORIDE 0.9 % IV SOLN: Freq: Once | INTRAVENOUS | Status: DC

## 2011-11-09 MED ORDER — DROPERIDOL 2.5 MG/ML IJ SOLN: 0.6250 mg | INTRAMUSCULAR | Status: DC | PRN

## 2011-11-09 MED ORDER — HYDROMORPHONE HCL PF 1 MG/ML IJ SOLN: 0.2500 mg | INTRAMUSCULAR | Status: DC | PRN

## 2011-11-09 MED ORDER — FENTANYL CITRATE 0.05 MG/ML IJ SOLN
INTRAMUSCULAR | Status: DC | PRN
  Administered 2011-11-09: 150 ug via INTRAVENOUS

## 2011-11-09 MED ORDER — INSULIN ASPART 100 UNIT/ML ~~LOC~~ SOLN: 0.0000 [IU] | Freq: Every day | SUBCUTANEOUS | Status: DC

## 2011-11-09 MED ORDER — SODIUM CHLORIDE 0.9 % IV SOLN: INTRAVENOUS | Status: DC

## 2011-11-09 MED ORDER — SODIUM CHLORIDE 0.9 % IV SOLN
INTRAVENOUS | Status: DC | PRN
  Administered 2011-11-09 (×2): via INTRAVENOUS

## 2011-11-09 MED ORDER — MIDAZOLAM HCL 2 MG/2ML IJ SOLN
1.0000 mg | INTRAMUSCULAR | Status: DC | PRN
  Administered 2011-11-09: 1 mg via INTRAVENOUS

## 2011-11-09 MED ORDER — HYDROCORTISONE SOD SUCCINATE 100 MG IJ SOLR
50.0000 mg | Freq: Four times a day (QID) | INTRAMUSCULAR | Status: DC
  Administered 2011-11-09 – 2011-11-10 (×3): 50 mg via INTRAVENOUS
  Filled 2011-11-09 (×7): qty 1

## 2011-11-09 MED ORDER — ONDANSETRON HCL 4 MG/2ML IJ SOLN: 4.0000 mg | Freq: Four times a day (QID) | INTRAMUSCULAR | Status: DC | PRN

## 2011-11-09 MED ORDER — ASPIRIN 300 MG RE SUPP
300.0000 mg | RECTAL | Status: DC
  Filled 2011-11-09: qty 1

## 2011-11-09 MED ORDER — PHENYLEPHRINE HCL 10 MG/ML IJ SOLN
INTRAMUSCULAR | Status: DC | PRN
  Administered 2011-11-09: 40 ug via INTRAVENOUS
  Administered 2011-11-09 (×2): 80 ug via INTRAVENOUS

## 2011-11-09 MED ORDER — SODIUM CHLORIDE 0.9 % IV SOLN: 250.0000 mL | INTRAVENOUS | Status: DC | PRN

## 2011-11-09 MED ORDER — LACTATED RINGERS IV SOLN
INTRAVENOUS | Status: DC
  Administered 2011-11-09: 10:00:00 via INTRAVENOUS

## 2011-11-09 MED ORDER — PROPOFOL 10 MG/ML IV EMUL
INTRAVENOUS | Status: DC | PRN
  Administered 2011-11-09: 130 mg via INTRAVENOUS

## 2011-11-09 MED ORDER — CEFAZOLIN SODIUM 1-5 GM-% IV SOLN: 1.0000 g | Freq: Once | INTRAVENOUS | Status: DC

## 2011-11-09 MED ORDER — SODIUM CHLORIDE 0.9 % IJ SOLN
1.5000 mg | INTRAVENOUS | Status: AC
  Filled 2011-11-09: qty 0.3

## 2011-11-09 MED ORDER — FENTANYL CITRATE 0.05 MG/ML IJ SOLN
50.0000 ug | INTRAMUSCULAR | Status: DC | PRN
  Administered 2011-11-10 – 2011-11-11 (×3): 100 ug via INTRAVENOUS
  Filled 2011-11-09 (×3): qty 2

## 2011-11-09 MED ORDER — MIDAZOLAM BOLUS VIA INFUSION
1.0000 mg | INTRAVENOUS | Status: DC | PRN
  Administered 2011-11-09: 1 mg via INTRAVENOUS
  Filled 2011-11-09: qty 2

## 2011-11-09 MED ORDER — INSULIN ASPART 100 UNIT/ML ~~LOC~~ SOLN
0.0000 [IU] | Freq: Three times a day (TID) | SUBCUTANEOUS | Status: DC
  Administered 2011-11-09: 3 [IU] via SUBCUTANEOUS
  Administered 2011-11-10: 7 [IU] via SUBCUTANEOUS
  Administered 2011-11-10 (×2): 4 [IU] via SUBCUTANEOUS

## 2011-11-09 MED ORDER — VECURONIUM BROMIDE 10 MG IV SOLR
INTRAVENOUS | Status: DC | PRN
  Administered 2011-11-09: 2 mg via INTRAVENOUS
  Administered 2011-11-09: 1 mg via INTRAVENOUS
  Administered 2011-11-09: 2 mg via INTRAVENOUS
  Administered 2011-11-09: 3 mg via INTRAVENOUS

## 2011-11-09 MED ORDER — LISINOPRIL 20 MG PO TABS
20.0000 mg | ORAL_TABLET | Freq: Every day | ORAL | Status: DC
  Administered 2011-11-10: 20 mg via ORAL
  Filled 2011-11-09 (×3): qty 1

## 2011-11-09 MED ORDER — OXYMETAZOLINE HCL 0.05 % NA SOLN
NASAL | Status: AC
  Administered 2011-11-09: 05:00:00
  Filled 2011-11-09: qty 15

## 2011-11-09 MED ORDER — LACTATED RINGERS IV SOLN
INTRAVENOUS | Status: DC | PRN
  Administered 2011-11-09: 10:00:00 via INTRAVENOUS

## 2011-11-09 MED ORDER — HEPARIN SODIUM (PORCINE) 1000 UNIT/ML IJ SOLN
INTRAMUSCULAR | Status: DC | PRN
  Administered 2011-11-09: 1000 [IU] via INTRAVENOUS
  Administered 2011-11-09: 500 [IU] via INTRAVENOUS
  Administered 2011-11-09: 2000 [IU] via INTRAVENOUS
  Administered 2011-11-09 (×3): 500 [IU] via INTRAVENOUS

## 2011-11-09 MED ORDER — SODIUM CHLORIDE 0.9 % IV SOLN
2.0000 mg/h | INTRAVENOUS | Status: DC
  Administered 2011-11-09: 2 mg/h via INTRAVENOUS
  Filled 2011-11-09 (×2): qty 10

## 2011-11-09 MED ORDER — FENTANYL BOLUS VIA INFUSION
50.0000 ug | Freq: Four times a day (QID) | INTRAVENOUS | Status: DC | PRN
  Filled 2011-11-09: qty 100

## 2011-11-09 MED ORDER — SUCCINYLCHOLINE CHLORIDE 20 MG/ML IJ SOLN
INTRAMUSCULAR | Status: DC | PRN
  Administered 2011-11-09: 100 mg via INTRAVENOUS

## 2011-11-09 MED ORDER — PREDNISONE 2.5 MG PO TABS
2.5000 mg | ORAL_TABLET | ORAL | Status: DC
  Filled 2011-11-09: qty 1

## 2011-11-09 MED ORDER — SODIUM CHLORIDE 0.9 % IV SOLN
50.0000 ug/h | INTRAVENOUS | Status: DC
  Administered 2011-11-09: 50 ug/h via INTRAVENOUS
  Filled 2011-11-09 (×2): qty 50

## 2011-11-09 MED ORDER — CARVEDILOL 6.25 MG PO TABS
6.2500 mg | ORAL_TABLET | Freq: Two times a day (BID) | ORAL | Status: DC
  Administered 2011-11-09 – 2011-11-10 (×3): 6.25 mg via ORAL
  Filled 2011-11-09 (×6): qty 1

## 2011-11-09 MED ORDER — PANTOPRAZOLE SODIUM 40 MG IV SOLR
40.0000 mg | Freq: Every day | INTRAVENOUS | Status: DC
  Administered 2011-11-09: 40 mg via INTRAVENOUS
  Filled 2011-11-09: qty 40

## 2011-11-09 MED ORDER — OXYCODONE-ACETAMINOPHEN 5-325 MG PO TABS
1.0000 | ORAL_TABLET | Freq: Once | ORAL | Status: AC
  Administered 2011-11-09: 1 via ORAL
  Filled 2011-11-09: qty 1

## 2011-11-09 MED ORDER — SODIUM CHLORIDE 0.9 % IV SOLN
10.0000 mg | INTRAVENOUS | Status: DC | PRN
  Administered 2011-11-09: 14:00:00 via INTRAVENOUS
  Administered 2011-11-09: 50 ug/min via INTRAVENOUS

## 2011-11-09 MED ORDER — IOHEXOL 300 MG/ML  SOLN
150.0000 mL | Freq: Once | INTRAMUSCULAR | Status: AC | PRN
  Administered 2011-11-09: 125 mL via INTRA_ARTERIAL

## 2011-11-09 MED ORDER — NICARDIPINE HCL IN NACL 20-0.86 MG/200ML-% IV SOLN
5.0000 mg/h | INTRAVENOUS | Status: DC
  Filled 2011-11-09: qty 200

## 2011-11-09 MED ORDER — ALBUTEROL SULFATE (2.5 MG/3ML) 0.083% IN NEBU
INHALATION_SOLUTION | RESPIRATORY_TRACT | Status: DC | PRN
  Administered 2011-11-09: 1 mL via RESPIRATORY_TRACT

## 2011-11-09 NOTE — ED Notes (Signed)
From home. Was sitting up and had an acute onset of bleeding from both nostrils and bleeding from bilateral tear ducts. Patient denies any injury or insult to nose/eyes. This has happened before about 3 months ago and required surgical intervention.

## 2011-11-09 NOTE — H&P (Signed)
Name: Melody Gray MRN: [id number] DOB: 09-22-1970    LOS: 0  Referring Provider:  Dr. Estanislado Pandy (IR)  Reason for Referral:  S/P embolization after severe epistaxis and Sarcoidosis requiring intubation   PULMONARY / CRITICAL CARE MEDICINE  HPI:  Intermittent epistaxis for several months w/ previous hospitalization for embolization (09/14/11 by Dr Bryon Lions). Pt started heavy epistaxis yesterday morning after coughing. Admitted by IR who performed bilateral superselective embolization of abnormal bilat IMAX branches and abnormal nasal branches of facial artery. This was performed under general anesthesia as pt unable to protect airway. Of note pt w/ h/o Sarcoidosis, smoker, Obesity CAD s/p Cath, DM, HTN, and GERD  Past Medical History  Diagnosis Date  . Sarcoidosis     Followed by Dr. Melvyn Novas; w/ liver involvement per biopsy 12/09, Reversible airway component so started on [facility] 01/2010; HFA 75% p coaching 05/2010  . Hypoxemia     CT angiogram 9/11>> No PE; PFTs 10/11- FEV1 1.20 (49%) with 16% better p B2, DLCO 33%> corrects to 84; O2 sats ok on 4 lpm X rapid walk X 3 laps 05/2010  . Morbid obesity     Target wt= 153 for BMI <30  . QT prolongation   . Diabetes mellitus   . Hypertension   . Hx of cardiac cath 2/08    No CAD, no RAS,  normal EF  . Seborrheic dermatitis of scalp   . 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 Hep B serology neg. but Hep C ab +, HIV neg. 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 Cardiomyopathy, nonischemic     EF 45% 12/10; Echo 7/11 normal EF, PAS 48  . Diabetic retinopathy     Right eye 2/11  . Health maintenance examination     Mammogram 05/2010 Negative; Last Pap smear 03/2008; Last DM eye exam 2/11> mild non-proliferative diabetic retinopathy. OD  . Helicobacter pylori ab+ 05/2011    Pt was symptomatic and treatment planned for 05/2011   Past Surgical History  Procedure Date    . Tubal ligation   . Breast surgery   . Cesarean section    Prior to Admission medications   Medication Sig Start Date End Date Taking? Authorizing Provider  carvedilol (COREG) 6.25 MG tablet Take 6.25 mg by mouth 2 (two) times daily.   01/04/11 01/04/12  Lelon Perla, MD  furosemide (LASIX) 40 MG tablet Take 40 mg by mouth daily.    Historical Provider, MD  insulin aspart protamine-insulin aspart (NOVOLOG 70/30) (70-30) 100 UNIT/ML injection Inject 23-25 Units into the skin 2 (two) times daily with a meal. Inject 25 units before breakfast and inject 23 units before dinner.    Historical Provider, MD  lisinopril (PRINIVIL,ZESTRIL) 20 MG tablet Take 20 mg by mouth daily.    Historical Provider, MD  metFORMIN (GLUCOPHAGE) 500 MG tablet Take 1,000 mg by mouth 2 (two) times daily with a meal.    Historical Provider, MD  omeprazole (PRILOSEC) 20 MG capsule Take 40 mg by mouth 2 (two) times daily.    Historical Provider, MD  potassium chloride SA (K-DUR,KLOR-CON) 20 MEQ tablet Take 20 mEq by mouth daily.    Historical Provider, MD  predniSONE (DELTASONE) 2.5 MG tablet Take 2.5 mg by mouth every other day.    Historical Provider, MD  varenicline (CHANTIX) 1 MG tablet Take 1 mg by mouth 2 (two) times  daily.    Historical Provider, MD   Allergies Allergies  Allergen Reactions  . Vicodin (Hydrocodone-Acetaminophen) Itching    Family History Family History  Problem Relation Age of Onset  . Cancer Mother     colon cancer  . Multiple sclerosis Father   . Asthma Sister     in childhood  . Diabetes Father   . Hypertension     Social History  reports that she has been smoking Cigarettes.  She has been smoking about 0 packs per day for the past 20 years. She does not have any smokeless tobacco history on file. She reports that she drinks alcohol. She reports that she does not use illicit drugs.  Review Of Systems:  Unable to obtain as pt intubated and sedated  Brief patient description:    Arrived from IR intubated and sedated w/ bloody packing in R nare. Stable on admission on Vent.  Events Since Admission: 7/12 Planned Extubation  Current Status: Intubated and sedated  Vital Signs: Temp:  [98 F (36.7 C)-98.2 F (36.8 C)] 98 F (36.7 C) (07/11 1600) Pulse Rate:  [78-115] 78  (07/11 1700) Resp:  [17-20] 17  (07/11 1700) BP: (99-180)/(63-120) 99/64 mmHg (07/11 1700) SpO2:  [92 %-100 %] 100 % (07/11 1700) FiO2 (%):  [50 %-100 %] 50 % (07/11 1700) Weight:  [223 lb 8.7 oz (101.4 kg)] 223 lb 8.7 oz (101.4 kg) (07/11 1300)  Physical Examination: General:  Intubated, Obese Neuro:  Sedated HEENT:  R Nare w/ packing Cardiovascular:  RRR no m/r/g Lungs: Course breath sounds, intubated Abdomen:  NABS, obese Musculoskeletal/Ext: no edema, 2+ distal pulses Skin:  Intact, warm   Active Problems:  * No active hospital problems. *    ASSESSMENT AND PLAN  PULMONARY  Lab 11/09/11 1556  PHART 7.297*  PCO2ART 67.5*  PO2ART 327.0*  HCO3 33.1*  O2SAT 100.0   Ventilator Settings: Vent Mode:  [-] PRVC FiO2 (%):  [50 %-100 %] 50 % Set Rate:  [2 bmp-16 bmp] 2 bmp Vt Set:  [400 mL] 400 mL PEEP:  [5 cmH20] 5 cmH20 Plateau Pressure:  [23 cmH20] 23 cmH20 [id number] tube in good position.  Low lung volumes and central venous congestion.  ETT:  7/11  A:  Acute respiratory failure requiring mechanically ventilation. Likely multifactorial. Obesity hypoventilation syndrome, sarcoidosis flare/progression, +/- aspiration, airway occlusion due to thick neck especially on sedation.   P:   - continue vent w/ likely extubation tomorrow - Wean sedation in am for SBT - ABG in am - CXR in am - Due to likely adrenal insufficiency will replace home prednisone 2.5mg  (sarcoidosis) for Hydrocortisone 50mg  Q6   CARDIOVASCULAR No results found for this basename: TROPONINI:5,LATICACIDVEN:5, O2SATVEN:5,PROBNP:5 in the last 168 hours ECG:  None  Lines:  7/11 L arm  PIV>>> 7/11 R forearm PIV>>>  A: HTN at baseline and stable on initial admission to floor but BP dropped after Fentanyl and versed. No significant blood loss per IR report and no evidence of intravascular depletion from home prior to admission  P:  - Hold home Lasix, Coreg, and Lisinopril until pressure returns - consider fluid bolus if BP drops - consider stress dose steroids if BP remains low   RENAL  Lab 11/09/11 0902  NA 136  K 3.6  CL 96  CO2 29  BUN 12  CREATININE 0.58  CALCIUM 9.3  MG --  PHOS --   Intake/Output      07/10 0701 - 07/11 0700 07/11 0701 -  07/12 0700   I.V. (mL/kg)  1800 (17.8)   Total Intake(mL/kg)  1800 (17.8)   Urine (mL/kg/hr)  375 (0.4)   Total Output  375   Net  +1425         Foley:  7/11  A: No apparent renal insult. H/o nephrotic syndrome  P:   - BMET in am - strict I/O   GASTROINTESTINAL  Lab 11/09/11 0902  AST 20  ALT 12  ALKPHOS 152*  BILITOT 0.3  PROT 7.8  ALBUMIN 3.0*    A:  H/O GERD and H. Pylori. On PPI at home. Mildly elevated Alk PHos w/ normal liver enzymes. No intervention at this time  P:   - Protonix as on omeprazole prior to admission - NPO    HEMATOLOGIC  Lab 11/09/11 1528 11/09/11 0534  HGB 8.7* 9.8*  HCT 27.7* 31.0*  PLT 174 204  INR -- 0.96  APTT -- 38*   A:  Baseline anemic to 10.5hgb likely of chronic disease. Slightly down on admission likely due to blood loss and dilution. Pt/INR normal.  P:  - CBC in am - Monitor VS for evidence of continued anemia  INFECTIOUS  Lab 11/09/11 1528 11/09/11 0534  WBC 6.3 7.1  PROCALCITON -- --   Cultures: None  Antibiotics: none  A:  No WBC. No fever.   P:   - CBC in am.  - Monitor for fevers  ENDOCRINE  Lab 11/09/11 1705 11/09/11 1552 11/09/11 1012  GLUCAP 148* 137* 193*   A:  H/O DM. On Novolog 70/30 at home. CBG elevated on admission. Likely component of adrenal insufficiency. On Prednisone 2.5 at home for sarcoid.  P:   - SSI -  Monitor CBG closely - BMET in am - DC home prednisone and start Stress steroid of hydrocortisone 50mg  Q6.  NEUROLOGIC  A:  No h/o neurological deficits. Sedated  P:   - continue w/ Fentanyl and versed through the night and wean in the am for likely sedation.   BEST PRACTICE / DISPOSITION  Level of Care: ICU  Primary Service: PCCM  Consultants: ENT  Code Status: Full  Diet: NPO DVT Px: SCDs  GI Px: Protonix (on omeprazole preadmission)  Skin Integrity: Intact  Social / Family: Not available   MERRELL, Angela Vazguez, M.D. Family Medicine PGY-2  11/09/2011, 5:28 PM  Seen, examined and discussed in detail with Dr Marily Memos. Agree with above. 30 mins CCM time  Merton Border, MD ; [facility] [phone].  After 5:30 PM or weekends, call [phone]

## 2011-11-09 NOTE — Progress Notes (Signed)
ETT advanced per Dr. Alva Garnet.

## 2011-11-09 NOTE — Procedures (Signed)
S/P bilateral CCA and bilateral ECA arteriograms followed by superselective embolization of abnormal bilateral IMAX branches ,and abnormal nasal branches  of  facial artery bilaterally.,under GA

## 2011-11-09 NOTE — H&P (Signed)
Melody Gray is an 41 y.o. female.   Chief Complaint: epistaxis- recurrent Pt has had hx of epistaxis off and on for months Embolization performed at [facility] 5/6 /13 - Dr Bryon Lions No bleeding til early am today - coughed and started bleeding  Heavily from rt nares - packed now  Scheduled for cerebral arteriogram and possible embolization HPI: Sarcoidosis; DM; HTN; GERD; smoker  Past Medical History  Diagnosis Date  . Sarcoidosis     Followed by Dr. Melvyn Novas; w/ liver involvement per biopsy 12/09, Reversible airway component so started on [facility] 01/2010; HFA 75% p coaching 05/2010  . Hypoxemia     CT angiogram 9/11>> No PE; PFTs 10/11- FEV1 1.20 (49%) with 16% better p B2, DLCO 33%> corrects to 84; O2 sats ok on 4 lpm X rapid walk X 3 laps 05/2010  . Morbid obesity     Target wt= 153 for BMI <30  . QT prolongation   . Diabetes mellitus   . Hypertension   . Hx of cardiac cath 2/08    No CAD, no RAS,  normal EF  . Seborrheic dermatitis of scalp   . 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 Hep B serology neg. but Hep C ab +, HIV neg. 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 Cardiomyopathy, nonischemic     EF 45% 12/10; Echo 7/11 normal EF, PAS 48  . Diabetic retinopathy     Right eye 2/11  . Health maintenance examination     Mammogram 05/2010 Negative; Last Pap smear 03/2008; Last DM eye exam 2/11> mild non-proliferative diabetic retinopathy. OD  . Helicobacter pylori ab+ 05/2011    Pt was symptomatic and treatment planned for 05/2011    Past Surgical History  Procedure Date  . Tubal ligation   . Breast surgery   . Cesarean section     Family History  Problem Relation Age of Onset  . Cancer Mother     colon cancer  . Multiple sclerosis Father   . Asthma Sister     in childhood  . Diabetes Father   . Hypertension     Social History:  reports that she has been smoking Cigarettes.  She has  been smoking about 0 packs per day for the past 20 years. She does not have any smokeless tobacco history on file. She reports that she drinks alcohol. She reports that she does not use illicit drugs.  Allergies:  Allergies  Allergen Reactions  . Vicodin (Hydrocodone-Acetaminophen) Itching     (Not in a hospital admission)  Results for orders placed during the hospital encounter of 11/09/11 (from the past 48 hour(s))  CBC WITH DIFFERENTIAL     Status: Abnormal   Collection Time   11/09/11  5:34 AM      Component Value Range Comment   WBC 7.1  4.0 - 10.5 K/uL    RBC 3.73 (*) 3.87 - 5.11 MIL/uL    Hemoglobin 9.8 (*) 12.0 - 15.0 g/dL    HCT 31.0 (*) 36.0 - 46.0 %    MCV 83.1  78.0 - 100.0 fL    MCH 26.3  26.0 - 34.0 pg    MCHC 31.6  30.0 - 36.0 g/dL    RDW 15.0  11.5 - 15.5 %    Platelets 204  150 - 400 K/uL    Neutrophils Relative 58  43 -  77 %    Neutro Abs 4.1  1.7 - 7.7 K/uL    Lymphocytes Relative 27  12 - 46 %    Lymphs Abs 1.9  0.7 - 4.0 K/uL    Monocytes Relative 9  3 - 12 %    Monocytes Absolute 0.7  0.1 - 1.0 K/uL    Eosinophils Relative 5  0 - 5 %    Eosinophils Absolute 0.4  0.0 - 0.7 K/uL    Basophils Relative 0  0 - 1 %    Basophils Absolute 0.0  0.0 - 0.1 K/uL   PROTIME-INR     Status: Normal   Collection Time   11/09/11  5:34 AM      Component Value Range Comment   Prothrombin Time 13.0  11.6 - 15.2 seconds    INR 0.96  0.00 - 1.49   APTT     Status: Abnormal   Collection Time   11/09/11  5:34 AM      Component Value Range Comment   aPTT 38 (*) 24 - 37 seconds    No results found.  Review of Systems  Constitutional: Negative for fever.  HENT: Positive for nosebleeds and sore throat.   Eyes: Negative for blurred vision.  Respiratory: Positive for cough and hemoptysis.   Cardiovascular: Positive for leg swelling. Negative for chest pain.  Gastrointestinal: Negative for nausea, vomiting and abdominal pain.  Neurological: Positive for headaches. Negative  for dizziness.    Blood pressure 153/84, pulse 105, temperature 98.2 F (36.8 C), temperature source Oral, resp. rate 19, SpO2 93.00%. Physical Exam  Constitutional: She is oriented to person, place, and time. She appears well-developed and well-nourished.  HENT:       Active bleeding from rt nares; packed in ER Evidence of bleeding from Rt tear duct- none now  Cardiovascular: Normal rate, regular rhythm and normal heart sounds.   No murmur heard. Respiratory: Effort normal and breath sounds normal. She has no wheezes.  GI: Soft. Bowel sounds are normal. There is no tenderness.  Musculoskeletal: Normal range of motion.  Neurological: She is alert and oriented to person, place, and time.     Assessment/Plan Hx of nose bleeds; packing many times in several months Embolization performed at [facility] 09/04/11 - Dr Bryon Lions No bleeding til early this am after coughing Bleeding from rt nares Now scheduled for cerebral arteriogram and possible embolization in IR Pt aware of procedure benefits and risks and agreeable to proceed. Consent signed and in chart  Kumari Sculley A 11/09/2011, 8:46 AM

## 2011-11-09 NOTE — ED Notes (Signed)
ENT cart and Dr. Karle Starch to bedside at patients' arrival

## 2011-11-09 NOTE — Progress Notes (Signed)
Patient received from IR and placed on the ventilator.  Settings PRVC 400 RR 16 Peep +5 FIO2 100%.  Patient has a size 7.5 ETT placed in IR secured at 19@Lip .  RT changed out pink tape and placed a tube holder on patient.  This tube is not a subglottic ETT.  RT will continue to monitor.

## 2011-11-09 NOTE — ED Notes (Signed)
MD at bedside. Pt will remain in ED until transported to interventional radiology for procedure.

## 2011-11-09 NOTE — Transfer of Care (Signed)
Immediate Anesthesia Transfer of Care Note  Patient: Melody Gray  Procedure(s) Performed: Procedure(s) (LRB): RADIOLOGY WITH ANESTHESIA (N/A)  Patient Location: ICU  Anesthesia Type: General  Level of Consciousness: sedated  Airway & Oxygen Therapy: Patient connected to tracheostomy mask oxygen  Post-op Assessment: Report given to PACU RN and Post -op Vital signs reviewed and stable  Post vital signs: Reviewed and stable  Complications: No apparent anesthesia complications

## 2011-11-09 NOTE — ED Notes (Signed)
Waiting for Dr. Earley Brooke to see pt and have procedure in interventional radiology.

## 2011-11-09 NOTE — Progress Notes (Signed)
MRSA nasal swab not completed due to bilateral nare embolization.

## 2011-11-09 NOTE — Progress Notes (Signed)
I SUX PT AND SHE BEGAN TO COUGH, AS SHE BEGAN COUGHING HER NOSE BEGAN BLEEDING HEAVILY.  I INFORMED THE RN AND HE CAME IN TO PACK HER NOSE WITH GAUZE AFTER CALLING THE BOX AND DR TELLING HIM TO TRY TO GET IT STOPPED AND CALL BACK IF ANYMORE PROBLEMS.  SHE IS NOT BLEEDING AT THIS TIME, AND IS RESTING QUIETLY

## 2011-11-09 NOTE — Preoperative (Signed)
Beta Blockers   Reason not to administer Beta Blockers:Not Applicable, took Coreg 0000000 at 2300

## 2011-11-09 NOTE — ED Provider Notes (Signed)
History     CSN: [id number]  Arrival date & time 11/09/11  [id number]   First MD Initiated Contact with Patient 11/09/11 [phone]      Chief Complaint  Patient presents with  . Epistaxis  . Eye Drainage    (Consider location/radiation/quality/duration/timing/severity/associated sxs/prior treatment) HPI Pt with multiple medical problems was seen for epistaxis several times in May 2013, found by ENT to have a bleeding posterior vessel and eventually had embolization at [facility]. She reports she was doing well until just prior to arrival when she began having brisk bright red blood from her R nare, not painful but also coming from her L nare less severely and through her tear ducts.   Past Medical History  Diagnosis Date  . Sarcoidosis     Followed by Dr. Melvyn Novas; w/ liver involvement per biopsy 12/09, Reversible airway component so started on [facility] 01/2010; HFA 75% p coaching 05/2010  . Hypoxemia     CT angiogram 9/11>> No PE; PFTs 10/11- FEV1 1.20 (49%) with 16% better p B2, DLCO 33%> corrects to 84; O2 sats ok on 4 lpm X rapid walk X 3 laps 05/2010  . Morbid obesity     Target wt= 153 for BMI <30  . QT prolongation   . Diabetes mellitus   . Hypertension   . Hx of cardiac cath 2/08    No CAD, no RAS,  normal EF  . Seborrheic dermatitis of scalp   . 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 Hep B serology neg. but Hep C ab +, HIV neg. 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 Cardiomyopathy, nonischemic     EF 45% 12/10; Echo 7/11 normal EF, PAS 48  . Diabetic retinopathy     Right eye 2/11  . Health maintenance examination     Mammogram 05/2010 Negative; Last Pap smear 03/2008; Last DM eye exam 2/11> mild non-proliferative diabetic retinopathy. OD  . Helicobacter pylori ab+ 05/2011    Pt was symptomatic and treatment planned for 05/2011    Past Surgical History  Procedure Date  . Tubal ligation   .  Breast surgery   . Cesarean section     Family History  Problem Relation Age of Onset  . Cancer Mother     colon cancer  . Multiple sclerosis Father   . Asthma Sister     in childhood  . Diabetes Father   . Hypertension      History  Substance Use Topics  . Smoking status: Former Smoker -- 0.0 packs/day for 20 years    Types: Cigarettes  . Smokeless tobacco: Not on file  . Alcohol Use: Yes     rare    OB History    Grav Para Term Preterm Abortions TAB SAB Ect Mult Living                  Review of Systems All other systems reviewed and are negative except as noted in HPI.   Allergies  Vicodin  Home Medications   Current Outpatient Rx  Name Route Sig Dispense Refill  . ACCU-CHEK FASTCLIX LANCETS MISC Does not apply 1 each by Does not apply route 3 (three) times daily before meals. Dx code- 250.00 102 each 12  . CARVEDILOL 6.25 MG PO TABS Oral Take 6.25 mg by mouth 2 (two) times daily.      . FUROSEMIDE  40 MG PO TABS Oral Take 40 mg by mouth daily.    Marland Kitchen GLUCOSE BLOOD VI STRP Other 1 each by Other route 3 (three) times daily before meals. Dx code 250.00 100 each 12  . INSULIN ASPART PROT & ASPART (70-30) 100 UNIT/ML  SUSP Subcutaneous Inject 25 Units into the skin 2 (two) times daily before a meal. Take 25 units before breakfast and 23 units before dinner. 10 mL 12  . INSULIN PEN NEEDLE 31G X 5 MM MISC  Box of 100 needs to use with flexpen2 times before meals. Dx code 250.00 100 each 12  . LISINOPRIL 20 MG PO TABS Oral Take 20 mg by mouth daily.    Marland Kitchen METFORMIN HCL 500 MG PO TABS Oral Take 2 tablets (1,000 mg total) by mouth 2 (two) times daily with a meal. 60 tablet 5  . OMEPRAZOLE 20 MG PO CPDR Oral Take 40 mg by mouth 2 (two) times daily.    Marland Kitchen POTASSIUM CHLORIDE CRYS ER 20 MEQ PO TBCR Oral Take 20 mEq by mouth daily.    Marland Kitchen PREDNISONE 2.5 MG PO TABS Oral Take 2.5 mg by mouth every other day.    Marland Kitchen VARENICLINE TARTRATE 1 MG PO TABS Oral Take 1 mg by mouth 2 (two) times  daily.      BP 180/90  Pulse 109  Temp 98.2 F (36.8 C) (Oral)  Resp 19  SpO2 97%  Physical Exam  Nursing note and vitals reviewed. Constitutional: She is oriented to person, place, and time. She appears well-developed and well-nourished.  HENT:  Head: Normocephalic and atraumatic.       Brisk bright red blood from both nares R>L  Eyes: EOM are normal. Pupils are equal, round, and reactive to light.       Bloody tears  Neck: Normal range of motion. Neck supple.  Cardiovascular: Normal rate, normal heart sounds and intact distal pulses.   Pulmonary/Chest: Effort normal and breath sounds normal.  Abdominal: Bowel sounds are normal. She exhibits no distension. There is no tenderness.  Musculoskeletal: Normal range of motion. She exhibits no edema and no tenderness.  Neurological: She is alert and oriented to person, place, and time. She has normal strength. No cranial nerve deficit or sensory deficit.  Skin: Skin is warm and dry. No rash noted.  Psychiatric: She has a normal mood and affect.    ED Course  Procedures (including critical care time)  Labs Reviewed  CBC WITH DIFFERENTIAL - Abnormal; Notable for the following:    RBC 3.73 (*)     Hemoglobin 9.8 (*)     HCT 31.0 (*)     All other components within normal limits  APTT - Abnormal; Notable for the following:    aPTT 38 (*)     All other components within normal limits  PROTIME-INR   No results found.   No diagnosis found.    MDM  Pt had clots which were blown out, afrin was instilled and a rhino rocket was placed. Bleeding is improved. Discussed with Dr. Benjamine Mola who recommended discussion with neuro-interventional radiology about embolization.   Dr. Estanislado Pandy to see the patient in the ED and take for embolization. Bleeding remains well controlled for now.       Akaiya Touchette B. Karle Starch, MD 11/09/11 [phone]

## 2011-11-09 NOTE — Anesthesia Preprocedure Evaluation (Addendum)
Anesthesia Evaluation  Patient identified by MRN, date of birth, ID band Patient awake    Reviewed: Allergy & Precautions, H&P , NPO status , Patient's Chart, lab work & pertinent test results, reviewed documented beta blocker date and time   History of Anesthesia Complications (+) PROLONGED EMERGENCE  Airway Mallampati: III TM Distance: >3 FB Neck ROM: Full    Dental  (+) Teeth Intact and Dental Advisory Given   Pulmonary shortness of breath and with exertion, Current Smoker,  breath sounds clear to auscultation  + wheezing      Cardiovascular hypertension, Pt. on medications and Pt. on home beta blockers +CHF Rhythm:Regular Rate:Tachycardia     Neuro/Psych negative neurological ROS     GI/Hepatic Neg liver ROS, GERD-  Medicated,  Endo/Other  Insulin Dependent  Renal/GU negative Renal ROS     Musculoskeletal   Abdominal (+) + obese,   Peds  Hematology   Anesthesia Other Findings   Reproductive/Obstetrics                          Anesthesia Physical Anesthesia Plan  ASA: III and Emergent  Anesthesia Plan: General   Post-op Pain Management:    Induction: Intravenous, Rapid sequence and Cricoid pressure planned  Airway Management Planned: Oral ETT  Additional Equipment:   Intra-op Plan:   Post-operative Plan: Possible Post-op intubation/ventilation  Informed Consent: I have reviewed the patients History and Physical, chart, labs and discussed the procedure including the risks, benefits and alternatives for the proposed anesthesia with the patient or authorized representative who has indicated his/her understanding and acceptance.   Dental advisory given  Plan Discussed with: Anesthesiologist, Surgeon and CRNA  Anesthesia Plan Comments:        Anesthesia Quick Evaluation

## 2011-11-10 ENCOUNTER — Inpatient Hospital Stay (HOSPITAL_COMMUNITY): Payer: Medicare Other

## 2011-11-10 DIAGNOSIS — I422 Other hypertrophic cardiomyopathy: Secondary | ICD-10-CM

## 2011-11-10 DIAGNOSIS — N039 Chronic nephritic syndrome with unspecified morphologic changes: Secondary | ICD-10-CM

## 2011-11-10 DIAGNOSIS — I509 Heart failure, unspecified: Secondary | ICD-10-CM

## 2011-11-10 DIAGNOSIS — N189 Chronic kidney disease, unspecified: Secondary | ICD-10-CM

## 2011-11-10 DIAGNOSIS — Z09 Encounter for follow-up examination after completed treatment for conditions other than malignant neoplasm: Secondary | ICD-10-CM | POA: Diagnosis not present

## 2011-11-10 DIAGNOSIS — D631 Anemia in chronic kidney disease: Secondary | ICD-10-CM

## 2011-11-10 DIAGNOSIS — Z4682 Encounter for fitting and adjustment of non-vascular catheter: Secondary | ICD-10-CM | POA: Diagnosis not present

## 2011-11-10 DIAGNOSIS — R040 Epistaxis: Secondary | ICD-10-CM | POA: Diagnosis not present

## 2011-11-10 LAB — POCT ACTIVATED CLOTTING TIME
Activated Clotting Time: 174 seconds
Activated Clotting Time: 169 seconds

## 2011-11-10 LAB — GLUCOSE, CAPILLARY
Glucose-Capillary: 220 mg/dL — ABNORMAL HIGH (ref 70–99)
Glucose-Capillary: 185 mg/dL — ABNORMAL HIGH (ref 70–99)
Glucose-Capillary: 164 mg/dL — ABNORMAL HIGH (ref 70–99)
Glucose-Capillary: 162 mg/dL — ABNORMAL HIGH (ref 70–99)

## 2011-11-10 LAB — CBC
HCT: 27.9 % — ABNORMAL LOW (ref 36.0–46.0)
Hemoglobin: 8.7 g/dL — ABNORMAL LOW (ref 12.0–15.0)
MCH: 26.2 pg (ref 26.0–34.0)
MCHC: 31.2 g/dL (ref 30.0–36.0)
MCV: 84 fL (ref 78.0–100.0)
Platelets: 209 10*3/uL (ref 150–400)
RBC: 3.32 MIL/uL — ABNORMAL LOW (ref 3.87–5.11)
RDW: 15.3 % (ref 11.5–15.5)
WBC: 8.3 10*3/uL (ref 4.0–10.5)

## 2011-11-10 LAB — BASIC METABOLIC PANEL
BUN: 14 mg/dL (ref 6–23)
CO2: 28 mEq/L (ref 19–32)
Calcium: 8.3 mg/dL — ABNORMAL LOW (ref 8.4–10.5)
Chloride: 99 mEq/L (ref 96–112)
Creatinine, Ser: 0.74 mg/dL (ref 0.50–1.10)
GFR calc Af Amer: >90 mL/min (ref >90)
GFR calc non Af Amer: >90 mL/min (ref >90)
Glucose, Bld: 203 mg/dL — ABNORMAL HIGH (ref 70–99)
Potassium: 4 mEq/L (ref 3.5–5.1)
Sodium: 136 mEq/L (ref 135–145)

## 2011-11-10 MED ORDER — PANTOPRAZOLE SODIUM 40 MG PO TBEC
40.0000 mg | DELAYED_RELEASE_TABLET | Freq: Every day | ORAL | Status: DC
  Administered 2011-11-10: 40 mg via ORAL
  Filled 2011-11-10: qty 1

## 2011-11-10 MED ORDER — FUROSEMIDE 10 MG/ML IJ SOLN
INTRAMUSCULAR | Status: AC
  Filled 2011-11-10: qty 4

## 2011-11-10 MED ORDER — PREDNISONE 2.5 MG PO TABS: 2.5000 mg | ORAL_TABLET | ORAL | Status: DC

## 2011-11-10 MED ORDER — PHENYLEPHRINE HCL 0.5 % NA SOLN
5.0000 [drp] | Freq: Once | NASAL | Status: AC
  Administered 2011-11-11: 5 [drp] via NASAL
  Filled 2011-11-10: qty 15

## 2011-11-10 MED ORDER — FUROSEMIDE 40 MG PO TABS
40.0000 mg | ORAL_TABLET | Freq: Every day | ORAL | Status: DC
  Administered 2011-11-10: 40 mg via ORAL
  Filled 2011-11-10 (×2): qty 1

## 2011-11-10 MED ORDER — HYDRALAZINE HCL 20 MG/ML IJ SOLN
5.0000 mg | Freq: Three times a day (TID) | INTRAMUSCULAR | Status: DC | PRN
  Administered 2011-11-10 (×2): 5 mg via INTRAVENOUS
  Filled 2011-11-10 (×2): qty 0.25

## 2011-11-10 MED ORDER — HYDRALAZINE HCL 20 MG/ML IJ SOLN: 5.0000 mg | Freq: Three times a day (TID) | INTRAMUSCULAR | Status: DC | PRN

## 2011-11-10 NOTE — Progress Notes (Addendum)
Inpatient Diabetes Program Recommendations  AACE/ADA: New Consensus Statement on Inpatient Glycemic Control (2009)  Target Ranges:  Prepandial:   less than 140 mg/dL      Peak postprandial:   less than 180 mg/dL (1-2 hours)      Critically ill patients:  140 - 180 mg/dL   Reason for Visit: Hyperglycemia in 200's   Inpatient Diabetes Program Recommendations Correction (SSI): Please change to q 4 hrs while patient is NPO Might be extremely helpful to use the ICU Hyperglycemia protocol/order set until po for glucose control.  Note: Thank you, Rosita Kea, RN, CNS, Diabetes Coordinator [phone])

## 2011-11-10 NOTE — Consult Note (Signed)
Reason for Consult: Persistent Epistaxis Referring Physician: Collene Gobble, MD   HPI:  Melody Gray is an 41 y.o. female with a history of mltiple medical problems and bilateral severe epistaxis. She previously underwent bilateral nasal packing, cauterization, and embolization in May 2013. She returned to [facility] ER yesterday c/o bilateral epistaxis with brisk bright red blood from her R nare,L nare, and through her tear ducts. She underwent repeat embolization of bilateral IMA and facial arteries yesterday. However, pt developed profuse bilateral bleeding today. ENT re-consulted for further evaluation.   Past Medical History  Diagnosis Date  . Sarcoidosis     Followed by Dr. Melvyn Novas; w/ liver involvement per biopsy 12/09, Reversible airway component so started on [facility] 01/2010; HFA 75% p coaching 05/2010  . Hypoxemia     CT angiogram 9/11>> No PE; PFTs 10/11- FEV1 1.20 (49%) with 16% better p B2, DLCO 33%> corrects to 84; O2 sats ok on 4 lpm X rapid walk X 3 laps 05/2010  . Morbid obesity     Target wt= 153 for BMI <30  . QT prolongation   . Diabetes mellitus   . Hypertension   . Hx of cardiac cath 2/08    No CAD, no RAS,  normal EF  . Seborrheic dermatitis of scalp   . 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 Hep B serology neg. but Hep C ab +, HIV neg. 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 Cardiomyopathy, nonischemic     EF 45% 12/10; Echo 7/11 normal EF, PAS 48  . Diabetic retinopathy     Right eye 2/11  . Health maintenance examination     Mammogram 05/2010 Negative; Last Pap smear 03/2008; Last DM eye exam 2/11> mild non-proliferative diabetic retinopathy. OD  . Helicobacter pylori ab+ 05/2011    Pt was symptomatic and treatment planned for 05/2011    Past Surgical History  Procedure Date  . Tubal ligation   . Breast surgery   . Cesarean section     Family History  Problem Relation Age of Onset    . Cancer Mother     colon cancer  . Multiple sclerosis Father   . Asthma Sister     in childhood  . Diabetes Father   . Hypertension      Social History:  reports that she has been smoking Cigarettes.  She has been smoking about 0 packs per day for the past 20 years. She does not have any smokeless tobacco history on file. She reports that she drinks alcohol. She reports that she does not use illicit drugs.  Allergies:  Allergies  Allergen Reactions  . Vicodin (Hydrocodone-Acetaminophen) Itching    Medications:  I have reviewed the patient's current medications. Scheduled:   . sodium chloride   Intravenous Once  . carvedilol  6.25 mg Oral BID  . furosemide  40 mg Oral Daily  . insulin aspart  0-20 Units Subcutaneous TID WC  . insulin aspart  0-5 Units Subcutaneous QHS  . lisinopril  20 mg Oral Daily  . nitroGLYCERIN 1.5mg /41ml(25 mcg/ml) - MC-IR  1.5 mg Intra-arterial to XRAY  . pantoprazole  40 mg Oral Q1200  . predniSONE  2.5 mg Oral Q48H  . DISCONTD: aspirin  324 mg Oral NOW  . DISCONTD: aspirin  300 mg Rectal NOW  . DISCONTD: dexmedetomidine  0.4-1.2 mcg/kg/hr Intravenous to XRAY  .  DISCONTD: hydrocortisone sod succinate (SOLU-CORTEF) injection  50 mg Intravenous Q6H  . DISCONTD: pantoprazole (PROTONIX) IV  40 mg Intravenous QHS  . DISCONTD: predniSONE  2.5 mg Oral Q48H   [id number] chloride, fentaNYL, fentaNYL, hydrALAZINE, midazolam  Results for orders placed during the hospital encounter of 11/09/11 (from the past 48 hour(s))  CBC WITH DIFFERENTIAL     Status: Abnormal   Collection Time   11/09/11  5:34 AM      Component Value Range Comment   WBC 7.1  4.0 - 10.5 K/uL    RBC 3.73 (*) 3.87 - 5.11 MIL/uL    Hemoglobin 9.8 (*) 12.0 - 15.0 g/dL    HCT 31.0 (*) 36.0 - 46.0 %    MCV 83.1  78.0 - 100.0 fL    MCH 26.3  26.0 - 34.0 pg    MCHC 31.6  30.0 - 36.0 g/dL    RDW 15.0  11.5 - 15.5 %    Platelets 204  150 - 400 K/uL    Neutrophils Relative 58  43 - 77 %     Neutro Abs 4.1  1.7 - 7.7 K/uL    Lymphocytes Relative 27  12 - 46 %    Lymphs Abs 1.9  0.7 - 4.0 K/uL    Monocytes Relative 9  3 - 12 %    Monocytes Absolute 0.7  0.1 - 1.0 K/uL    Eosinophils Relative 5  0 - 5 %    Eosinophils Absolute 0.4  0.0 - 0.7 K/uL    Basophils Relative 0  0 - 1 %    Basophils Absolute 0.0  0.0 - 0.1 K/uL   PROTIME-INR     Status: Normal   Collection Time   11/09/11  5:34 AM      Component Value Range Comment   Prothrombin Time 13.0  11.6 - 15.2 seconds    INR 0.96  0.00 - 1.49   APTT     Status: Abnormal   Collection Time   11/09/11  5:34 AM      Component Value Range Comment   aPTT 38 (*) 24 - 37 seconds   COMPREHENSIVE METABOLIC PANEL     Status: Abnormal   Collection Time   11/09/11  9:02 AM      Component Value Range Comment   Sodium 136  135 - 145 mEq/L    Potassium 3.6  3.5 - 5.1 mEq/L    Chloride 96  96 - 112 mEq/L    CO2 29  19 - 32 mEq/L    Glucose, Bld 232 (*) 70 - 99 mg/dL    BUN 12  6 - 23 mg/dL    Creatinine, Ser 0.58  0.50 - 1.10 mg/dL    Calcium 9.3  8.4 - 10.5 mg/dL    Total Protein 7.8  6.0 - 8.3 g/dL    Albumin 3.0 (*) 3.5 - 5.2 g/dL    AST 20  0 - 37 U/L    ALT 12  0 - 35 U/L    Alkaline Phosphatase 152 (*) 39 - 117 U/L    Total Bilirubin 0.3  0.3 - 1.2 mg/dL    GFR calc non Af Amer >90  >90 mL/min    GFR calc Af Amer >90  >90 mL/min   GLUCOSE, CAPILLARY     Status: Abnormal   Collection Time   11/09/11 10:12 AM      Component Value Range Comment   Glucose-Capillary 193 (*) 70 -  99 mg/dL   POCT ACTIVATED CLOTTING TIME     Status: Normal   Collection Time   11/09/11 12:19 PM      Component Value Range Comment   Activated Clotting Time 174     POCT ACTIVATED CLOTTING TIME     Status: Normal   Collection Time   11/09/11  2:09 PM      Component Value Range Comment   Activated Clotting Time 169     CBC     Status: Abnormal   Collection Time   11/09/11  3:28 PM      Component Value Range Comment   WBC 6.3  4.0 - 10.5 K/uL     RBC 3.36 (*) 3.87 - 5.11 MIL/uL    Hemoglobin 8.7 (*) 12.0 - 15.0 g/dL    HCT 27.7 (*) 36.0 - 46.0 %    MCV 82.4  78.0 - 100.0 fL    MCH 25.9 (*) 26.0 - 34.0 pg    MCHC 31.4  30.0 - 36.0 g/dL    RDW 15.2  11.5 - 15.5 %    Platelets 174  150 - 400 K/uL   DIFFERENTIAL     Status: Normal   Collection Time   11/09/11  3:28 PM      Component Value Range Comment   Neutrophils Relative 44  43 - 77 %    Neutro Abs 2.8  1.7 - 7.7 K/uL    Lymphocytes Relative 39  12 - 46 %    Lymphs Abs 2.5  0.7 - 4.0 K/uL    Monocytes Relative 11  3 - 12 %    Monocytes Absolute 0.7  0.1 - 1.0 K/uL    Eosinophils Relative 5  0 - 5 %    Eosinophils Absolute 0.3  0.0 - 0.7 K/uL    Basophils Relative 0  0 - 1 %    Basophils Absolute 0.0  0.0 - 0.1 K/uL   GLUCOSE, CAPILLARY     Status: Abnormal   Collection Time   11/09/11  3:52 PM      Component Value Range Comment   Glucose-Capillary 137 (*) 70 - 99 mg/dL   POCT I-STAT 3, BLOOD GAS (G3+)     Status: Abnormal   Collection Time   11/09/11  3:56 PM      Component Value Range Comment   pH, Arterial 7.297 (*) 7.350 - 7.450    pCO2 arterial 67.5 (*) 35.0 - 45.0 mmHg    pO2, Arterial 327.0 (*) 80.0 - 100.0 mmHg    Bicarbonate 33.1 (*) 20.0 - 24.0 mEq/L    TCO2 35  0 - 100 mmol/L    O2 Saturation 100.0      Acid-Base Excess 5.0 (*) 0.0 - 2.0 mmol/L    Patient temperature 98.0 F      Collection site RADIAL, ALLEN'S TEST ACCEPTABLE      Sample type ARTERIAL      Comment NOTIFIED PHYSICIAN     GLUCOSE, CAPILLARY     Status: Abnormal   Collection Time   11/09/11  5:05 PM      Component Value Range Comment   Glucose-Capillary 148 (*) 70 - 99 mg/dL   GLUCOSE, CAPILLARY     Status: Abnormal   Collection Time   11/09/11  9:27 PM      Component Value Range Comment   Glucose-Capillary 131 (*) 70 - 99 mg/dL   BASIC METABOLIC PANEL     Status: Abnormal  Collection Time   11/10/11  3:30 AM      Component Value Range Comment   Sodium 136  135 - 145 mEq/L     Potassium 4.0  3.5 - 5.1 mEq/L    Chloride 99  96 - 112 mEq/L    CO2 28  19 - 32 mEq/L    Glucose, Bld 203 (*) 70 - 99 mg/dL    BUN 14  6 - 23 mg/dL    Creatinine, Ser 0.74  0.50 - 1.10 mg/dL    Calcium 8.3 (*) 8.4 - 10.5 mg/dL    GFR calc non Af Amer >90  >90 mL/min    GFR calc Af Amer >90  >90 mL/min   CBC     Status: Abnormal   Collection Time   11/10/11  3:30 AM      Component Value Range Comment   WBC 8.3  4.0 - 10.5 K/uL    RBC 3.32 (*) 3.87 - 5.11 MIL/uL    Hemoglobin 8.7 (*) 12.0 - 15.0 g/dL    HCT 27.9 (*) 36.0 - 46.0 %    MCV 84.0  78.0 - 100.0 fL    MCH 26.2  26.0 - 34.0 pg    MCHC 31.2  30.0 - 36.0 g/dL    RDW 15.3  11.5 - 15.5 %    Platelets 209  150 - 400 K/uL   GLUCOSE, CAPILLARY     Status: Abnormal   Collection Time   11/10/11  7:52 AM      Component Value Range Comment   Glucose-Capillary 220 (*) 70 - 99 mg/dL   GLUCOSE, CAPILLARY     Status: Abnormal   Collection Time   11/10/11 11:17 AM      Component Value Range Comment   Glucose-Capillary 185 (*) 70 - 99 mg/dL   GLUCOSE, CAPILLARY     Status: Abnormal   Collection Time   11/10/11  4:49 PM      Component Value Range Comment   Glucose-Capillary 164 (*) 70 - 99 mg/dL     Portable Chest Xray In Am  11/10/2011  *RADIOLOGY REPORT*  Clinical Data: Intubated  PORTABLE CHEST - 1 VIEW  Comparison: Chest radiograph 11/09/2011  Findings: Patient rotated rightward.  Endotracheal tube appears approximately 3 cm from carina.  NG tube within the stomach. Stable cardiac silhouette.  There is improved aeration to the lung bases compared to prior.  Improvement of venous pulmonary congestion additionally.  IMPRESSION:  1.  Stable support apparatus. 2.   Improved ventilation to the lungs.  Original Report Authenticated By: Suzy Bouchard, M.D.   Dg Chest Port 1 View  11/09/2011  *RADIOLOGY REPORT*  Clinical Data: Post endotracheal tube placement  PORTABLE CHEST - 1 VIEW  Comparison: Chest radiograph 05/10/2011  Findings:  Interval placement of endotracheal tube with tip approximately 5 cm from carina.  NG tube extends to the stomach. Heart silhouette is large similar to prior with low lung volumes. Central venous congestion is present.  IMPRESSION: Endotracheal tube in good position.  Low lung volumes and central venous congestion.  Original Report Authenticated By: Suzy Bouchard, M.D.   Review of Systems  All other systems reviewed and are negative except as noted in HPI.   Blood pressure 134/81, pulse 106, temperature 98.2 F (36.8 C), temperature source Oral, resp. rate 15, height 5\' 3"  (1.6 m), weight 102 kg (224 lb 13.9 oz), SpO2 100.00%.  Physical Exam  Nursing note and vitals reviewed.  Constitutional: She  is oriented to person, place, and time. She appears well-developed and well-nourished. Mildly distressed HENT:  Head: Normocephalic and atraumatic.  Brisk bright red blood from both nares L more than R. OC: Blood clots in pharynx. Eyes: EOM are normal. Pupils are equal, round, and reactive to light.  Neck: Normal range of motion. Neck supple.  Cardiovascular: Normal rate, normal heart sounds and intact distal pulses.  Musculoskeletal: Normal range of motion. She exhibits no edema and no tenderness.  Neurological: She is alert and oriented to person, place, and time. She has normal strength. No cranial nerve deficit or sensory deficit.  Skin: Skin is warm and dry. No rash noted.  Psychiatric: She has a normal mood and affect.    Procedure: Anterior/Posterior nasal packing for control of bilateral epistaxis. Anesthesia: Topical xylocaine and Afrin Description: The patient is placed upright in her hospital bed.  Blood clot is suctions from the right nasal cavity. Bleeding is noted from anterior and posterior right nasal cavity.Topical xylocaine and Afrin are applied. A 10 cm Merocel packing is placed in the right nasal cavity with good hemostasis. The same procedure is repeated on the left side. The  patient tolerated the procedure well.  Assessment/Plan: Recurrent bilateral severe posterior epistaxis. Not responding to multiple cauterization and embolization attempts.  Now pt has bilateral AP packing with 10cm Merocel packs.  If patient has more epistaxis, will need to be transferred to a rhinologist [facility] or [facility]) for further evaluation and treatment.   Melody Gray,SUI W 11/10/2011, 5:43 PM

## 2011-11-10 NOTE — Discharge Summary (Signed)
Physician Discharge Summary  Patient ID: Melody Gray MRN: [id number] DOB: [birth year] Age: 41 y.o.  Admit date: 11/09/2011 Discharge date: 11/10/2011  PCP: Jerene Pitch, MD  Consultants:Interventional Radiology, ENT    Admitting Diagnosis: Epistaxis  Discharge Diagnosis: Principal Problem:  *Epistaxis Secondary Problems:  Sarcoidosis  DIABETES MELLITUS, TYPE II, UNCONTROLLED  Anemia in chronic kidney disease  HYPERTENSION  CONGESTIVE HEART FAILURE  Pulmonary hypertension associated with sarcoidosis  Acute and chronic respiratory failure    Hospital Course 41 year old female with medical h/o Sarcoidosis, smoker, Obesity, CAD s/p Cath, DM, HTN, GERD, Intermittent epistaxis for several months w/ previous hospitalization for embolization (09/14/11 by Dr Bryon Lions) admitted with heavy epistaxis. Admitted by IR who performed bilateral superselective embolization of abnormal bilat IMAX branches and abnormal nasal branches of facial artery. Performed under general anesthesia secondary to patient being unable to protect airway from blood loss. Patient transported to MICU post op due to acute and chronic respiratory failure. Patient was placed on stress Patient extubated on 7/12 without difficulty. Other respiratory issues included likely obesity hypoventilation, sarcoidosis. Patient wasplaced on stress dose steroids while intubated which were converted back to her home PO meds for sarcoid when able to tolerate PO. Marland Kitchen Patient's bleeding increased after extubation. ENT was called and placed bilateral AP packing with 10cm Merocel packs. Due to continued severe epistaxis nonresponsive to multiple cauterization and embolization attempts decision was made to transport to a rhinologist  atfor further evaluation and treatment. Patient's hgb decreased from 9.8 on admit to 8.7 in AM before discharge. Patient did become mildly tachycardic with increasing blood loss.   Other active issues 1.  HTN-patient restarted on home meds (Lasix, Coreg, and Lisinopril) once able to tolerate PO. Blood pressure likely elevated due to patient distress. Prn Hydralazine ordered on top of home meds.  2. GERD and H. Pylori Hx. Continued on PPI.  3. DM-glucose well controlled on sliding scale. On novolog 70/30 at home-this was initially held and restarted when tolerating PO.  Physical exam:  BP 160/85  Pulse 108  Temp 98.2 F (36.8 C) (Oral)  Resp 17  Ht 5\' 3"  (1.6 m)  Wt 224 lb 13.9 oz (102 kg)  BMI 39.83 kg/m2  SpO2 96%  Procedures/Imaging:  Portable Chest Xray In Am  11/10/2011  *RADIOLOGY REPORT*  Clinical Data: Intubated  PORTABLE CHEST - 1 VIEW  Comparison: Chest radiograph 11/09/2011  Findings: Patient rotated rightward.  Endotracheal tube appears approximately 3 cm from carina.  NG tube within the stomach. Stable cardiac silhouette.  There is improved aeration to the lung bases compared to prior.  Improvement of venous pulmonary congestion additionally.  IMPRESSION:  1.  Stable support apparatus. 2.   Improved ventilation to the lungs.  Original Report Authenticated By: Suzy Bouchard, M.D.   Dg Chest Port 1 View  11/09/2011  *RADIOLOGY REPORT*  Clinical Data: Post endotracheal tube placement  PORTABLE CHEST - 1 VIEW  Comparison: Chest radiograph 05/10/2011  Findings: Interval placement of endotracheal tube with tip approximately 5 cm from carina.  NG tube extends to the stomach. Heart silhouette is large similar to prior with low lung volumes. Central venous congestion is present.  IMPRESSION: Endotracheal tube in good position.  Low lung volumes and central venous congestion.  Original Report Authenticated By: Suzy Bouchard, M.D.    Labs  CBC  Lab 11/10/11 0330 11/09/11 1528 11/09/11 0534  WBC 8.3 6.3 7.1  HGB 8.7* 8.7* 9.8*  HCT 27.9* 27.7* 31.0*  PLT 209  174 204   BMET  Lab 11/10/11 0330 11/09/11 0902  NA 136 136  K 4.0 3.6  CL 99 96  CO2 28 29  BUN 14 12  CREATININE  0.74 0.58  CALCIUM 8.3* 9.3  PROT -- 7.8  BILITOT -- 0.3  ALKPHOS -- 152*  ALT -- 12  AST -- 20  GLUCOSE 203* 232*   Results for orders placed during the hospital encounter of 11/09/11 (from the past 72 hour(s))  CBC WITH DIFFERENTIAL     Status: Abnormal   Collection Time   11/09/11  5:34 AM      Component Value Range Comment   WBC 7.1  4.0 - 10.5 K/uL    RBC 3.73 (*) 3.87 - 5.11 MIL/uL    Hemoglobin 9.8 (*) 12.0 - 15.0 g/dL    HCT 31.0 (*) 36.0 - 46.0 %    MCV 83.1  78.0 - 100.0 fL    MCH 26.3  26.0 - 34.0 pg    MCHC 31.6  30.0 - 36.0 g/dL    RDW 15.0  11.5 - 15.5 %    Platelets 204  150 - 400 K/uL    Neutrophils Relative 58  43 - 77 %    Neutro Abs 4.1  1.7 - 7.7 K/uL    Lymphocytes Relative 27  12 - 46 %    Lymphs Abs 1.9  0.7 - 4.0 K/uL    Monocytes Relative 9  3 - 12 %    Monocytes Absolute 0.7  0.1 - 1.0 K/uL    Eosinophils Relative 5  0 - 5 %    Eosinophils Absolute 0.4  0.0 - 0.7 K/uL    Basophils Relative 0  0 - 1 %    Basophils Absolute 0.0  0.0 - 0.1 K/uL   PROTIME-INR     Status: Normal   Collection Time   11/09/11  5:34 AM      Component Value Range Comment   Prothrombin Time 13.0  11.6 - 15.2 seconds    INR 0.96  0.00 - 1.49   APTT     Status: Abnormal   Collection Time   11/09/11  5:34 AM      Component Value Range Comment   aPTT 38 (*) 24 - 37 seconds   COMPREHENSIVE METABOLIC PANEL     Status: Abnormal   Collection Time   11/09/11  9:02 AM      Component Value Range Comment   Sodium 136  135 - 145 mEq/L    Potassium 3.6  3.5 - 5.1 mEq/L    Chloride 96  96 - 112 mEq/L    CO2 29  19 - 32 mEq/L    Glucose, Bld 232 (*) 70 - 99 mg/dL    BUN 12  6 - 23 mg/dL    Creatinine, Ser 0.58  0.50 - 1.10 mg/dL    Calcium 9.3  8.4 - 10.5 mg/dL    Total Protein 7.8  6.0 - 8.3 g/dL    Albumin 3.0 (*) 3.5 - 5.2 g/dL    AST 20  0 - 37 U/L    ALT 12  0 - 35 U/L    Alkaline Phosphatase 152 (*) 39 - 117 U/L    Total Bilirubin 0.3  0.3 - 1.2 mg/dL    GFR calc non Af  Amer >90  >90 mL/min    GFR calc Af Amer >90  >90 mL/min   POCT ACTIVATED CLOTTING TIME  Status: Normal   Collection Time   11/09/11 12:19 PM      Component Value Range Comment   Activated Clotting Time 174     POCT ACTIVATED CLOTTING TIME     Status: Normal   Collection Time   11/09/11  2:09 PM      Component Value Range Comment   Activated Clotting Time 169     CBC     Status: Abnormal   Collection Time   11/09/11  3:28 PM      Component Value Range Comment   WBC 6.3  4.0 - 10.5 K/uL    RBC 3.36 (*) 3.87 - 5.11 MIL/uL    Hemoglobin 8.7 (*) 12.0 - 15.0 g/dL    HCT 27.7 (*) 36.0 - 46.0 %    MCV 82.4  78.0 - 100.0 fL    MCH 25.9 (*) 26.0 - 34.0 pg    MCHC 31.4  30.0 - 36.0 g/dL    RDW 15.2  11.5 - 15.5 %    Platelets 174  150 - 400 K/uL   DIFFERENTIAL     Status: Normal   Collection Time   11/09/11  3:28 PM      Component Value Range Comment   Neutrophils Relative 44  43 - 77 %    Neutro Abs 2.8  1.7 - 7.7 K/uL    Lymphocytes Relative 39  12 - 46 %    Lymphs Abs 2.5  0.7 - 4.0 K/uL    Monocytes Relative 11  3 - 12 %    Monocytes Absolute 0.7  0.1 - 1.0 K/uL    Eosinophils Relative 5  0 - 5 %    Eosinophils Absolute 0.3  0.0 - 0.7 K/uL    Basophils Relative 0  0 - 1 %    Basophils Absolute 0.0  0.0 - 0.1 K/uL   POCT I-STAT 3, BLOOD GAS (G3+)     Status: Abnormal   Collection Time   11/09/11  3:56 PM      Component Value Range Comment   pH, Arterial 7.297 (*) 7.350 - 7.450    pCO2 arterial 67.5 (*) 35.0 - 45.0 mmHg    pO2, Arterial 327.0 (*) 80.0 - 100.0 mmHg    Bicarbonate 33.1 (*) 20.0 - 24.0 mEq/L    TCO2 35  0 - 100 mmol/L    O2 Saturation 100.0      Acid-Base Excess 5.0 (*) 0.0 - 2.0 mmol/L    Patient temperature 98.0 F      Collection site RADIAL, ALLEN'S TEST ACCEPTABLE      Sample type ARTERIAL      Comment NOTIFIED PHYSICIAN     BASIC METABOLIC PANEL     Status: Abnormal   Collection Time   11/10/11  3:30 AM      Component Value Range Comment   Sodium 136   135 - 145 mEq/L    Potassium 4.0  3.5 - 5.1 mEq/L    Chloride 99  96 - 112 mEq/L    CO2 28  19 - 32 mEq/L    Glucose, Bld 203 (*) 70 - 99 mg/dL    BUN 14  6 - 23 mg/dL    Creatinine, Ser 0.74  0.50 - 1.10 mg/dL    Calcium 8.3 (*) 8.4 - 10.5 mg/dL    GFR calc non Af Amer >90  >90 mL/min    GFR calc Af Amer >90  >90 mL/min   CBC  Status: Abnormal   Collection Time   11/10/11  3:30 AM      Component Value Range Comment   WBC 8.3  4.0 - 10.5 K/uL    RBC 3.32 (*) 3.87 - 5.11 MIL/uL    Hemoglobin 8.7 (*) 12.0 - 15.0 g/dL    HCT 27.9 (*) 36.0 - 46.0 %    MCV 84.0  78.0 - 100.0 fL    MCH 26.2  26.0 - 34.0 pg    MCHC 31.2  30.0 - 36.0 g/dL    RDW 15.3  11.5 - 15.5 %    Platelets 209  150 - 400 K/uL        Patient condition at time of discharge/disposition: stable but requiring transport for surgery not available at current facility  Disposition- [facility]   Discharge follow up:   Discharge Orders    Future Appointments: Provider: Department: Dept Phone: Center:   11/21/2011 10:00 AM [facility] [phone] None   11/21/2011 11:00 AM Tanda Rockers, MD [facility] [phone] None      Discharge Medications  Velicia, Walker  Home Medication Instructions [id number]   Printed on:11/10/11 1828  Medication Information                    carvedilol (COREG) 6.25 MG tablet Take 6.25 mg by mouth 2 (two) times daily.             furosemide (LASIX) 40 MG tablet Take 40 mg by mouth daily.           lisinopril (PRINIVIL,ZESTRIL) 20 MG tablet Take 20 mg by mouth daily.           omeprazole (PRILOSEC) 20 MG capsule Take 40 mg by mouth 2 (two) times daily.           potassium chloride SA (K-DUR,KLOR-CON) 20 MEQ tablet Take 20 mEq by mouth daily.           predniSONE (DELTASONE) 2.5 MG tablet Take 2.5 mg by mouth every other day.           metFORMIN (GLUCOPHAGE) 500 MG tablet Take 1,000 mg by mouth 2 (two) times daily with a  meal.           insulin aspart protamine-insulin aspart (NOVOLOG 70/30) (70-30) 100 UNIT/ML injection Inject 23-25 Units into the skin 2 (two) times daily with a meal. Inject 25 units before breakfast and inject 23 units before dinner.           hydrALAZINE (APRESOLINE) 20 MG/ML injection Inject 0.25 mLs (5 mg total) into the vein every 8 (eight) hours as needed (sbp >160).             Garret Reddish, MD, PGY2  11/10/2011 6:28 PM   Baltazar Apo, MD, PhD 11/10/2011, 8:23 PM [facility] [phone] or if no answer [phone]

## 2011-11-10 NOTE — Anesthesia Postprocedure Evaluation (Signed)
  Anesthesia Post-op Note  Patient: Melody Gray  Procedure(s) Performed: Procedure(s) (LRB): RADIOLOGY WITH ANESTHESIA (N/A)  Patient Location: SICU  Anesthesia Type: General  Level of Consciousness: sedated and Patient remains intubated per anesthesia plan  Airway and Oxygen Therapy: Patient remains intubated per anesthesia plan and Patient placed on Ventilator (see vital sign flow sheet for setting)  Post-op Pain: none  Post-op Assessment: Post-op Vital signs reviewed, Patient's Cardiovascular Status Stable, Respiratory Function Stable, Patent Airway, No signs of Nausea or vomiting and Pain level controlled  Post-op Vital Signs: stable  Complications: No apparent anesthesia complications

## 2011-11-10 NOTE — Progress Notes (Signed)
Name: Melody Gray MRN: [id number] DOB: 1970/12/10    LOS: 1  Referring Provider:  Dr. Estanislado Pandy (IR)  Reason for Referral:  S/P embolization after severe epistaxis and Sarcoidosis requiring intubation   PULMONARY / CRITICAL CARE MEDICINE Brief patient description:   Arrived from IR intubated and sedated w/ bloody packing in R nare. Stable on admission on Vent.  Events Since Admission: 7/12 cough w/ removal of packing and bleeding for 3-5 min. 7/12 Planned Extubation  Current Status: Intubate, awake, tolerating SBT More nose bleeding with cough this am  Vital Signs: Temp:  [98 F (36.7 C)-98.6 F (37 C)] 98.4 F (36.9 C) (07/12 0355) Pulse Rate:  [76-112] 98  (07/12 0500) Resp:  [8-25] 8  (07/12 0500) BP: (88-180)/(48-103) 130/74 mmHg (07/12 0500) SpO2:  [92 %-100 %] 94 % (07/12 0500) FiO2 (%):  [30 %-100 %] 30 % (07/12 0407) Weight:  [215 lb 2.7 oz (97.6 kg)-224 lb 13.9 oz (102 kg)] 224 lb 13.9 oz (102 kg) (07/12 0500)  Intake/Output Summary (Last 24 hours) at 11/10/11 1000 Last data filed at 11/10/11 0600  Gross per 24 hour  Intake   2185 ml  Output    875 ml  Net   1310 ml    Physical Examination: General:  Intubated, Obese Neuro:  Sedated HEENT:  R Nare w/ packing Cardiovascular:  RRR no m/r/g Lungs: Course breath sounds, intubated Abdomen:  NABS, obese Musculoskeletal/Ext: no edema, 2+ distal pulses Skin:  Intact, warm   Active Problems:  Acute and chronic respiratory failure   ASSESSMENT AND PLAN  PULMONARY  Lab 11/09/11 1556  PHART 7.297*  PCO2ART 67.5*  PO2ART 327.0*  HCO3 33.1*  O2SAT 100.0   Ventilator Settings: Vent Mode:  [-] PRVC FiO2 (%):  [30 %-100 %] 30 % Set Rate:  [2 bmp-18 bmp] 18 bmp Vt Set:  [400 mL] 400 mL PEEP:  [5 cmH20] 5 cmH20 Plateau Pressure:  [2 cmH20-23 cmH20] 18 cmH20  CXR: Official read pending but film is rotated w/ ETT approximately 2cm above charina  and improved aeration from previous film w/ low lung  volumes w/ small patchy infiltrate in lower lung fields L>R representing likely atelectasis.   ETT:  7/11  A:  Acute respiratory failure requiring mechanically ventilation. Likely multifactorial. Obesity hypoventilation syndrome, sarcoidosis flare/progression, +/- aspiration, airway occlusion due to thick neck especially on sedation. Following commands and breathing over vent.  Sedation off  P:   - Likely extubation today - ABG pending - CXR in am - Due to likely adrenal insufficiency will replace home prednisone 2.5mg  (sarcoidosis) for Hydrocortisone 50mg  Q6   CARDIOVASCULAR No results found for this basename: TROPONINI:5,LATICACIDVEN:5, O2SATVEN:5,PROBNP:5 in the last 168 hours ECG:  None  Lines:  7/11 L arm PIV>>> 7/11 R forearm PIV>>>  A: HTN at baseline and stable on initial admission to floor but BP dropped after Fentanyl and versed. No significant blood loss per IR report and no evidence of intravascular depletion from home prior to admission  P:  - restart home Lasix, Coreg, and Lisinopril once able to take PO - Convert stress dose steroids to home pred dose once extubated   RENAL  Lab 11/10/11 0330 11/09/11 0902  NA 136 136  K 4.0 3.6  CL 99 96  CO2 28 29  BUN 14 12  CREATININE 0.74 0.58  CALCIUM 8.3* 9.3  MG -- --  PHOS -- --   Intake/Output      07/11 0701 - 07/12 0700  I.V. (mL/kg) 2158 (21.2)   Total Intake(mL/kg) 2158 (21.2)   Urine (mL/kg/hr) 875 (0.4)   Total Output 875   Net +1283        Foley:  7/11  A: No apparent renal insult. H/o nephrotic syndrome. UOP of 875 for past 24hrs and overall fluid positive 1.3L.  P:   - BMET in am - strict I/O   GASTROINTESTINAL  Lab 11/09/11 0902  AST 20  ALT 12  ALKPHOS 152*  BILITOT 0.3  PROT 7.8  ALBUMIN 3.0*    A:  H/O GERD and H. Pylori. On PPI at home. Mildly elevated Alk PHos w/ normal liver enzymes. No intervention at this time  P:   - Protonix as on omeprazole prior to admission -  Diet after extubation   HEMATOLOGIC  Lab 11/10/11 0330 11/09/11 1528 11/09/11 0534  HGB 8.7* 8.7* 9.8*  HCT 27.9* 27.7* 31.0*  PLT 209 174 204  INR -- -- 0.96  APTT -- -- 38*   A: Stable since embolization at 8.7.  Baseline anemic to 10.5hgb likely of chronic disease.  Slightly down on admission likely due to blood loss and dilution. Pt/INR normal. ENT following  P:  - F/U ENT recs - CBC in am - Monitor VS for evidence of continued anemia  INFECTIOUS  Lab 11/10/11 0330 11/09/11 1528 11/09/11 0534  WBC 8.3 6.3 7.1  PROCALCITON -- -- --   Cultures: None  Antibiotics: none  A:  No WBC. No fever.   P:   - CBC in am.  - Monitor for fevers  ENDOCRINE  Lab 11/09/11 2127 11/09/11 1705 11/09/11 1552 11/09/11 1012  GLUCAP 131* 148* 137* 193*   A:  Glucose well controlled here. H/O DM. On Novolog 70/30 at home. CBG elevated on admission. Likely component of adrenal insufficiency. On Prednisone 2.5 at home for sarcoid, but changed to Hydrocortisone 50 Q6 here.   P:   - SSI - Monitor CBG closely - BMET in am - convert hydrocortisone 50mg  Q6 to home pred  NEUROLOGIC  A:  No h/o neurological deficits. Follows commands. Awake and alert this am.   P:   - Keep Fentanyl and Versed off for likely extubation this am  BEST PRACTICE / DISPOSITION  Level of Care: ICU  Primary Service: PCCM  Consultants: ENT, IR Code Status: Full  Diet: NPO DVT Px: SCDs  GI Px: Protonix (on omeprazole preadmission)  Skin Integrity: Intact  Social / Family: Not available   MERRELL, DAVID, M.D. Family Medicine PGY-2  11/10/2011, 6:10 AM  Attending Addendum:  I have seen the patient, discussed the issues, test results and plans with Dr Marily Memos. I agree with the Assessment and Plans as ammended above.   Baltazar Apo, MD, PhD 11/10/2011, 10:02 AM [facility] [phone] or if no answer [phone]

## 2011-11-11 DIAGNOSIS — Z781 Physical restraint status: Secondary | ICD-10-CM | POA: Diagnosis not present

## 2011-11-11 DIAGNOSIS — I509 Heart failure, unspecified: Secondary | ICD-10-CM | POA: Diagnosis not present

## 2011-11-11 DIAGNOSIS — K219 Gastro-esophageal reflux disease without esophagitis: Secondary | ICD-10-CM | POA: Diagnosis present

## 2011-11-11 DIAGNOSIS — E11319 Type 2 diabetes mellitus with unspecified diabetic retinopathy without macular edema: Secondary | ICD-10-CM | POA: Diagnosis not present

## 2011-11-11 DIAGNOSIS — Z794 Long term (current) use of insulin: Secondary | ICD-10-CM | POA: Diagnosis not present

## 2011-11-11 DIAGNOSIS — I2510 Atherosclerotic heart disease of native coronary artery without angina pectoris: Secondary | ICD-10-CM | POA: Diagnosis present

## 2011-11-11 DIAGNOSIS — D869 Sarcoidosis, unspecified: Secondary | ICD-10-CM | POA: Diagnosis present

## 2011-11-11 DIAGNOSIS — J189 Pneumonia, unspecified organism: Secondary | ICD-10-CM | POA: Diagnosis not present

## 2011-11-11 DIAGNOSIS — I10 Essential (primary) hypertension: Secondary | ICD-10-CM | POA: Diagnosis present

## 2011-11-11 DIAGNOSIS — Z79899 Other long term (current) drug therapy: Secondary | ICD-10-CM | POA: Diagnosis not present

## 2011-11-11 DIAGNOSIS — E119 Type 2 diabetes mellitus without complications: Secondary | ICD-10-CM | POA: Diagnosis present

## 2011-11-11 DIAGNOSIS — Z6839 Body mass index (BMI) 39.0-39.9, adult: Secondary | ICD-10-CM | POA: Diagnosis not present

## 2011-11-11 DIAGNOSIS — G4733 Obstructive sleep apnea (adult) (pediatric): Secondary | ICD-10-CM | POA: Diagnosis present

## 2011-11-11 DIAGNOSIS — IMO0002 Reserved for concepts with insufficient information to code with codable children: Secondary | ICD-10-CM | POA: Diagnosis not present

## 2011-11-11 DIAGNOSIS — F17200 Nicotine dependence, unspecified, uncomplicated: Secondary | ICD-10-CM | POA: Diagnosis present

## 2011-11-11 DIAGNOSIS — J9620 Acute and chronic respiratory failure, unspecified whether with hypoxia or hypercapnia: Secondary | ICD-10-CM | POA: Diagnosis not present

## 2011-11-11 DIAGNOSIS — E662 Morbid (severe) obesity with alveolar hypoventilation: Secondary | ICD-10-CM | POA: Diagnosis not present

## 2011-11-11 DIAGNOSIS — Z6841 Body Mass Index (BMI) 40.0 and over, adult: Secondary | ICD-10-CM | POA: Diagnosis not present

## 2011-11-11 DIAGNOSIS — J99 Respiratory disorders in diseases classified elsewhere: Secondary | ICD-10-CM | POA: Diagnosis not present

## 2011-11-11 DIAGNOSIS — J9589 Other postprocedural complications and disorders of respiratory system, not elsewhere classified: Secondary | ICD-10-CM | POA: Diagnosis not present

## 2011-11-11 DIAGNOSIS — J449 Chronic obstructive pulmonary disease, unspecified: Secondary | ICD-10-CM | POA: Diagnosis present

## 2011-11-11 DIAGNOSIS — E1139 Type 2 diabetes mellitus with other diabetic ophthalmic complication: Secondary | ICD-10-CM | POA: Diagnosis not present

## 2011-11-11 DIAGNOSIS — E1165 Type 2 diabetes mellitus with hyperglycemia: Secondary | ICD-10-CM | POA: Diagnosis not present

## 2011-11-11 DIAGNOSIS — R040 Epistaxis: Secondary | ICD-10-CM | POA: Diagnosis not present

## 2011-11-11 DIAGNOSIS — I428 Other cardiomyopathies: Secondary | ICD-10-CM | POA: Diagnosis not present

## 2011-11-11 DIAGNOSIS — D638 Anemia in other chronic diseases classified elsewhere: Secondary | ICD-10-CM | POA: Diagnosis not present

## 2011-11-11 DIAGNOSIS — E669 Obesity, unspecified: Secondary | ICD-10-CM | POA: Diagnosis present

## 2011-11-11 NOTE — Progress Notes (Signed)
Patient send to [facility] , by [facility]. Report called and given to Bary Castilla, RN at [address] , [address]. Pt family informed of this. Belonging sent with patient

## 2011-11-13 DIAGNOSIS — R040 Epistaxis: Secondary | ICD-10-CM | POA: Insufficient documentation

## 2011-11-16 ENCOUNTER — Telehealth: Payer: Self-pay | Admitting: Internal Medicine

## 2011-11-16 DIAGNOSIS — J9690 Respiratory failure, unspecified, unspecified whether with hypoxia or hypercapnia: Secondary | ICD-10-CM

## 2011-11-16 NOTE — Telephone Encounter (Signed)
Referral sent and pt is aware

## 2011-11-21 ENCOUNTER — Ambulatory Visit (INDEPENDENT_AMBULATORY_CARE_PROVIDER_SITE_OTHER): Payer: Medicaid Other | Admitting: Internal Medicine

## 2011-11-21 ENCOUNTER — Encounter: Payer: Self-pay | Admitting: Internal Medicine

## 2011-11-21 VITALS — BP 142/90 | HR 87 | Temp 98.1°F | Ht 61.0 in | Wt 218.0 lb

## 2011-11-21 DIAGNOSIS — J9610 Chronic respiratory failure, unspecified whether with hypoxia or hypercapnia: Secondary | ICD-10-CM

## 2011-11-21 DIAGNOSIS — D869 Sarcoidosis, unspecified: Secondary | ICD-10-CM

## 2011-11-21 DIAGNOSIS — I2789 Other specified pulmonary heart diseases: Secondary | ICD-10-CM

## 2011-11-21 LAB — PULMONARY FUNCTION TEST

## 2011-11-21 NOTE — Progress Notes (Signed)
Subjective:     Patient ID: Melody Gray, female   DOB: [birth year]   MRN: [id number]  HPI  33 yobf  Smoker dx of sarcoidosis in 2001 = sob, cough became 02 dep in July 2011 last off prednsione March 2011 but daily since then and complicated by [facility]    January 06, 2010 ov  c/o increased SOB. Pt states outpt clinic advised her to follow up with Dr. Melvyn Novas for Sacoidosis. doe on 02 at 2 lpm sitting then 4 with activity gets off at the curb struggles with grocery store. Try taking Prednisone one half even days in am with breakfast  Be sure you take omeprazole Take one 30-60 min before first and last meals of the day  Stop nifedipine  Start Cardizem 240 mg once daily  Wear 24 hours per day, 2 lpm at rest and sleeping, 4lpm with activity, this is the best way to help your heart function better  CT Chest ( repeated with contrast)> no PE, just sarcoid changes   February 09, 2010 Followup sarcoid w/ PFTs. Breathing is the same- no better or worse.on 02 2 lpm w/in and then outside using 4lpm> cc doe x 50 ft still has to stop every aisle at [employer] but not [facility] parking. rec Start Dulera 2 puffs first thing in am and 2 puffs again in pm about 12 hours later and prednisone 10 mg every other day.   May 04, 2010 ov cc doe some better to point to where feels needs 02 less. rec  1) Stay on dulera 200 2 puffs first thing in am and 2 puffs again in pm about 12 hours later  2) Work on perfecting inhaler technique 3) Prednisone 20 mg 1/4 every other day  4) Stay on 02 2lpm 24 hours per day except increase to 4lpm with exercise  5) Weight control t thru ex    10/05/2010 ov/Gladine Plude back smoking and using 02 most of the time at 2 lpm never up to 4 and prednisone 10mg   One half every other day.  rec 1) Stay on dulera 200 2 puffs first thing in am and 2 puffs again in pm about 12 hours later  2) Work on perfecting inhaler technique: relax and blow all the way out then take a nice smooth deep breath back in,  triggering the inhaler at same time you start breathing  3) Prednisone 10  mg 1/2 every other day  4) Stay on 02 2lpm 24 hours per day except increase to 4lpm with exercise  5) Weight control   thru exercise on 02 4lpm 6) Please see patient coordinator before you leave today  to schedule repeat echo and we will call when it comes back for review   It is critical that you not smoke at all      02/02/2011 f/u ov/Marea Reasner cc no change doe,  Minimal cough, now on ace and coreg. No excess mucus. rec Change your 02 to 2lpm at bedtime and with any activity outside your house    08/21/2011 f/u ov/Shakeem Stern still smoking but on chantix  cc doe x "up to a mile s stopping - off dulera and 02" but last did this one month prior to OV  - no cough or overt sinus or hb symptoms. rec Wear 02 at 2lpm whenever exercise outside your house and always when you sleep. Please schedule a follow up visit in 3 months but call sooner if needed with PFT's on return  Admit date: 11/09/2011  Discharge date: 11/10/2011  PCP: Jerene Pitch, MD  Consultants:Interventional Radiology, ENT  Admitting Diagnosis: Epistaxis  Discharge Diagnosis:  Principal Problem:  *Epistaxis  Secondary Problems:  Sarcoidosis  DIABETES MELLITUS, TYPE II, UNCONTROLLED  Anemia in chronic kidney disease  HYPERTENSION  CONGESTIVE HEART FAILURE  Pulmonary hypertension associated with sarcoidosis  Acute and chronic respiratory failure  Hospital Course  , Intermittent epistaxis for several months w/ previous hospitalization for embolization (09/14/11 by Dr Bryon Lions) admitted with heavy epistaxis. Admitted by IR who performed bilateral superselective embolization of abnormal bilat IMAX branches and abnormal nasal branches of facial artery. Performed under general anesthesia secondary to patient being unable to protect airway from blood loss. Patient transported to MICU post op due to acute and chronic respiratory failure. Patient was placed  on stress Patient extubated on 7/12 without difficulty. Other respiratory issues included likely obesity hypoventilation, sarcoidosis. Patient wasplaced on stress dose steroids while intubated which were converted back to her home PO meds for sarcoid when able to tolerate PO. Marland Kitchen Patient's bleeding increased after extubation. ENT was called and placed bilateral AP packing with 10cm Merocel packs. Due to continued severe epistaxis nonresponsive to multiple cauterization and embolization attempts decision was made to transport to a rhinologist atfor further evaluation and treatment. Patient's hgb decreased from 9.8 on admit to 8.7 in AM before discharge. Patient did become mildly tachycardic with increasing blood loss.  Other active issues  1. HTN-patient restarted on home meds (Lasix, Coreg, and Lisinopril) once able to tolerate PO. Blood pressure likely elevated due to patient distress. Prn Hydralazine ordered on top of home meds.  2. GERD and H. Pylori Hx. Continued on PPI.  3. DM-glucose well controlled on sliding scale. On novolog 70/30 at home-this was initially held and restarted when tolerating PO   11/21/2011 f/u ov/Sheniqua Carolan still smoking and 2.5 mg pred qod cc not limited by breathing as long as wears 2lpm with activity, minimal cough improving since et , did not take  bp meds on day of ov   No unusual cough, purulent sputum or sinus/hb symptoms on present rx.  Sleeping ok on 02 without nocturnal  or early am exacerbation  of respiratory  c/o's or need for noct saba. Also denies any obvious fluctuation of symptoms with weather or environmental changes or other aggravating or alleviating factors except as outlined above    Pt denies any significant sore throat, dysphagia, itching, sneezing,  nasal congestion or excess/ purulent secretions,  fever, chills, sweats, unintended wt loss, pleuritic or exertional cp, hempoptysis, orthopnea pnd or leg swelling.           Past Medical History:  Sarcoidosis  (Dr Sajjad Honea)-w/ liver involvement per biopsy 03/2008 - Reversible airway component so start [facility] 01/2010 > better  - HFa 75% p coaching May 04, 2010  - ? Off prednisone 05/2011 Unexplained Hypoxemia July 2011  - CT angiogram 01/07/10 >>> no PE  - PFT's February 09, 2010 FEV1 1.20 (49%) with ratio 72 and FRC 90%, FEV1  16% better p B2, DLC0 33% > corrects to 84   Morbid obesity  - Target wt = 153 for BMI < 30  QT prolongation  Diabetes mellitus, type II  Hypertension  Heart catheterization 06-06-06 : No CAD, no RAS, normal EF  Hx eclampsia  Hx of scalp seborrheic dermatitis            Objective:   Physical Exam She is a minimally  cushingoid-appearing amb bf nad  off 02  wt 238 January 06, 2010 >   217 May 04, 2010 > 203 10/05/2010 > 08/21/2011    221> 218 11/21/11 There is no stigmata liver disease  skin: anicteric  HEENT: normocephalic; PEERLA; no nasal or pharyngeal abnormalities  neck: supple  nodes: no cervical lymphadenopathy  chest: clear to ausculatation and percussion  heart: no murmurs, gallops, or rubs  abd: soft, nontender; BS normoactive; no abdominal masses, tenderness, organomegaly; abdomen is obese    cxr 05/10/11 1. Cardiomegaly without failure.  2. Low volume chest with basilar atelectasis.  3. Prominence of the right pulmonary hilum, likely secondary to  clinical history of sarcoidosis.     Assessment:          Plan:

## 2011-11-21 NOTE — Patient Instructions (Addendum)
Stop lisinopril and for the next month take benicar 40 mg one half daily to see if it makes any difference in any of your respiratory symptoms and if not ok to lisinopril when samples run out  The key is to stop smoking completely before smoking completely stops you!  Please schedule a follow up visit in 3 months but call sooner if needed    Late add:  Try pred 2.5 mg p 3 days

## 2011-11-21 NOTE — Progress Notes (Signed)
PFT done today. 

## 2011-11-22 ENCOUNTER — Ambulatory Visit: Payer: Self-pay | Admitting: Internal Medicine

## 2011-11-22 NOTE — Assessment & Plan Note (Signed)
-    Complicated by PAH - Walked 3 laps @ 185 ft each stopped due to  End of test, no desat on 4lpm 10/05/2010  - 02/02/2011  Walked RA  2 laps @ 185 ft each stopped due to  desat to 88 so change rx to 2lpm with activity outside the house and at hs - 08/21/2011  Walked RA x 1 laps @ 185 ft each stopped due to desat 85% - 11/21/2011  Walked RA  2 laps @ 185 ft each stopped due to  Sat 88  Adequate control on present rx, reviewed

## 2011-11-22 NOTE — Assessment & Plan Note (Signed)
-   PFT's 11/21/2011 FEV1 1.23 (50%) ratio 67 and no better p B2,  DLCO 31 corrects to 73  No evidence of active sarcoid on a physiologic dose of prednisone so will try q 3 d dosing.

## 2011-11-30 ENCOUNTER — Encounter: Payer: Self-pay | Admitting: Internal Medicine

## 2011-12-05 DIAGNOSIS — D869 Sarcoidosis, unspecified: Secondary | ICD-10-CM | POA: Diagnosis not present

## 2011-12-05 DIAGNOSIS — R040 Epistaxis: Secondary | ICD-10-CM | POA: Diagnosis not present

## 2012-01-19 ENCOUNTER — Ambulatory Visit (INDEPENDENT_AMBULATORY_CARE_PROVIDER_SITE_OTHER): Payer: Medicare Other | Admitting: Internal Medicine

## 2012-01-19 ENCOUNTER — Encounter: Payer: Self-pay | Admitting: Internal Medicine

## 2012-01-19 VITALS — BP 148/94 | HR 112 | Temp 97.5°F | Ht 61.0 in | Wt 218.3 lb

## 2012-01-19 DIAGNOSIS — I509 Heart failure, unspecified: Secondary | ICD-10-CM

## 2012-01-19 DIAGNOSIS — IMO0001 Reserved for inherently not codable concepts without codable children: Secondary | ICD-10-CM

## 2012-01-19 DIAGNOSIS — Z23 Encounter for immunization: Secondary | ICD-10-CM | POA: Diagnosis not present

## 2012-01-19 DIAGNOSIS — I10 Essential (primary) hypertension: Secondary | ICD-10-CM | POA: Diagnosis not present

## 2012-01-19 DIAGNOSIS — Z0000 Encounter for general adult medical examination without abnormal findings: Secondary | ICD-10-CM | POA: Diagnosis not present

## 2012-01-19 DIAGNOSIS — D869 Sarcoidosis, unspecified: Secondary | ICD-10-CM

## 2012-01-19 DIAGNOSIS — F17200 Nicotine dependence, unspecified, uncomplicated: Secondary | ICD-10-CM

## 2012-01-19 DIAGNOSIS — R040 Epistaxis: Secondary | ICD-10-CM

## 2012-01-19 LAB — GLUCOSE, CAPILLARY: Glucose-Capillary: 181 mg/dL — ABNORMAL HIGH (ref 70–99)

## 2012-01-19 LAB — POCT GLYCOSYLATED HEMOGLOBIN (HGB A1C): Hemoglobin A1C: 6.7

## 2012-01-19 MED ORDER — INSULIN ASPART PROT & ASPART (70-30 MIX) 100 UNIT/ML ~~LOC~~ SUSP: 23.0000 [IU] | Freq: Two times a day (BID) | SUBCUTANEOUS | Status: DC

## 2012-01-19 MED ORDER — OLMESARTAN MEDOXOMIL 40 MG PO TABS: 20.0000 mg | ORAL_TABLET | Freq: Every day | ORAL | Status: DC

## 2012-01-19 NOTE — Patient Instructions (Addendum)
CHECK YOUR BLOOD SUGARS THREE TIMES A DAY, at least for one whole week before seeing me  RETURN TO CLINIC FOR F/U IN ONE MONTH AND SCHEDULE WITH DONNA PLYLER FOR DIABETES EDUCATION  F/U WITH SOCIAL WORK FOR P4[facility] AS WE DISCUSSED  PLEASE CONTINUE TO TRY QUITTING SMOKING, TRY CALLING 1800QUITNOW  CONTINUE NOVOLOG 70/30 AS SCHEDULED RIGHT NOW, WILL NOT CHANGE BUT TRY THE NEW DIET WE TALKED ABOUT, GOOD BREAKFAST IN THE MORNING, DINNER AROUND 7PM  Continue metformin  Start taking Benacar 20mg  daily and continue Coreg  Your goal is to lose 3 pounds by next visit! :)  I will call you with potassium results  Continue to exercise and weight loss as discussed

## 2012-01-19 NOTE — Assessment & Plan Note (Signed)
Complaining of worsening dyspnea on exertion. Has not seen Dr. Stanford Breed in some time now. Encouraged to follow with Dr. Stanford Breed soon as possible. Continues to smoke. Last echo June 2012 noted to have ejection fraction of 35%. Is on Lasix 40 mg daily.  Sleeps with 2 pillows and with an interface to breathe better. Is on continuous oxygen even during the day approximately 2 L nasal cannula. Get short of breath even walking short distances.  -Followup BMP potassium and renal function -Follow with Dr. Stanford Breed as soon as possible -Smoking cessation advised

## 2012-01-19 NOTE — Assessment & Plan Note (Signed)
Admitted to the hospital for severe epistaxis to July 2013, transferred to [facility], status post surgery. Resolution of symptoms at this time. No major complaints.  -Followup CBC -Continue to monitor

## 2012-01-19 NOTE — Assessment & Plan Note (Signed)
Followed by Dr. Melvyn Novas.  Noted to not have any active sarcoid in July 2013. On physiologic dose of prednisone q3 d dosing  -Smoking cessation advised -Continue to followup with Dr. Melvyn Novas

## 2012-01-19 NOTE — Assessment & Plan Note (Addendum)
Weight loss advised counseled extensively on diet modifications and exercise as tolerated. Encourage better glucose control.  Weight today 218 pounds which is approximately 4-5 pounds less than her last visit.  -Goal of losing 3 pounds until next followup visit next month -Continue exercise, diet modifications as discussed -Followup with Butch Penny for nutrition plan

## 2012-01-19 NOTE — Assessment & Plan Note (Signed)
Hemoglobin A1c of 6.7 today, improved since last time. Melody Gray claims to not adhered to a proper diabetic diet lately and has not checked her blood sugar in the past 2 months. She denies any hypoglycemic episodes and says that if anything she has high blood sugars. She does claim to feel really hungry when she wakes up in the morning. She claims to be compliant with her medications. She takes metformin 500 mg twice a day and she also takes NovoLog 70/30 25 units in the morning and 23 units at night.  She has lost approximately 4 pounds since her prior visit. I advised her to followup with Melody Gray our diabetes coordinator who she has met with in the past however I feel it is necessary to reestablish care and start new plan for her improvement of diabetic control. She is in agreement and claims she'll continue to check her blood sugar 3 times a day and bring back her recordings. She will also continue to work on weight loss, exercise, diet modifications. I advised her to have a breakfast which usually skips, skipped lunch as needed or eat some fruit, and have dinner around 7 PM instead of 5 PM which usually doesn't end up stacking later. Try to avoid snacking as much as possible.  -Followup Melody Gray -Continue to monitor -Continue current regimen with NovoLog and metformin -Continue weight loss and diet modifications -Check blood sugars 3 times a day and bring in recordings to next followup appointment in one month

## 2012-01-19 NOTE — Progress Notes (Addendum)
Subjective:   Patient ID: Melody Gray female   DOB: May 19, 1970 41 y.o.   MRN: [id number]  HPI: Melody Gray is a 41 y.o. African American obese female with extensive past medical history significant for sarcoidosis followed by Dr. Melvyn Novas, congestive heart failure followed by Dr. Stanford Breed with EF of approximately 35-45%, uncontrolled hypertension, and diabetes presenting to the clinic today for routine followup and change of insulin pen prescription. Melody Gray was recently discharged from the hospital in July 20 13th for recurrent epistaxis for which his transfer to [facility] and is now status post surgery. Diminishes resolution of symptoms. She is followed closely by Dr. Melvyn Novas for her sarcoidosis. She continues to smoke cigarettes, smoking approximately one pack of cigarettes for 3 days. She is also on nasal cannula 2 L of oxygen at home, she is supposed to have it only at night but usually has it on during the day. She complains of continuous dyspnea on exertion and worsening over time. She claims she does get short of breath walking short distances around the house and occasional lower extremity edema. She is on Lasix daily. Of note, Melody Gray has not followed up with Dr. Stanford Breed for several months. I have encouraged her to followup with Dr. Stanford Breed as soon as possible. She does not exercise much. She is noted to have approximately 4-5 pound weight loss since her last clinic visit. She does not follow a strict diabetic diet and says that she will try to get better at this. She is on metformin and NovoLog 70/30 for her diabetes.  Her A1c has decreased since prior visit today noted to be 6.7. She does not regularly check her blood sugars but I have counseled her extensively and she has agreed to continue taking her blood sugars at least 3 times a day bringing in her values to her next clinic followup visit. She will also see Debera Lat for diabetes education and nutrition in the meantime. I have advised  her on heating regular meals, not skipping breakfast, not snacking late at night and eating fruit and healthy as much as possible. She claims she will try harder and now that her boyfriend has moved in to have enough food stamps to help around the house and with her 2 children. I will also get social work involved for possible referral to [facility].  She is also requesting possible referral to pulmonary rehabilitation if approved by her insurance. Her blood pressure continues to remain uncontrolled. She claims she was tried on a trial of Benicar after stopping lisinopril due to complaint of cough. She says she did notice excessive Benicar and cessation of cough however ran out of the trial and restart lisinopril. We will likely restart Benicar and continue Coreg today we discontinuation of lisinopril until further evaluation and suggestions by Dr. Stanford Breed. I also advised her following sodium restricted diet. Finally, I have advised her extensively on smoking cessation and also referred her to 1 800 quit now hotline. She claims she's had success in the past with Chantix however she stopped taking it. I've offered to restart Chantix at this time but she would like to think about it.  Otherwise she has no major complaints at this time. She denies any fever, chills, chest pain, nausea, vomiting, diarrhea, abdominal pain, or any urinary complaints at this time. She denies any recurrent episodes of epistaxis. She occasionally has headaches which he thinks is secondary to her surgery.  Past Medical History  Diagnosis Date  . Sarcoidosis  Followed by Dr. Melvyn Novas; w/ liver involvement per biopsy 12/09, Reversible airway component so started on [facility] 01/2010; HFA 75% p coaching 05/2010  . Hypoxemia     CT angiogram 9/11>> No PE; PFTs 10/11- FEV1 1.20 (49%) with 16% better p B2, DLCO 33%> corrects to 84; O2 sats ok on 4 lpm X rapid walk X 3 laps 05/2010  . Morbid obesity     Target wt= 153 for BMI <30  . QT prolongation     . Diabetes mellitus   . Hypertension   . Hx of cardiac cath 2/08    No CAD, no RAS,  normal EF  . Seborrheic dermatitis of scalp   . 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 Hep B serology neg. but Hep C ab +, HIV neg. 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 Cardiomyopathy, nonischemic     EF 45% 12/10; Echo 7/11 normal EF, PAS 48  . Diabetic retinopathy     Right eye 2/11  . Health maintenance examination     Mammogram 05/2010 Negative; Last Pap smear 03/2008; Last DM eye exam 2/11> mild non-proliferative diabetic retinopathy. OD  . Helicobacter pylori ab+ 05/2011    Pt was symptomatic and treatment planned for 05/2011   Current Outpatient Prescriptions  Medication Sig Dispense Refill  . furosemide (LASIX) 40 MG tablet Take 40 mg by mouth daily.      . insulin aspart protamine-insulin aspart (NOVOLOG 70/30) (70-30) 100 UNIT/ML injection Inject 23-25 Units into the skin 2 (two) times daily with a meal. Inject 25 units before breakfast and inject 23 units before dinner.  20 mL  5  . metFORMIN (GLUCOPHAGE) 500 MG tablet Take 1,000 mg by mouth 2 (two) times daily with a meal.      . omeprazole (PRILOSEC) 20 MG capsule Take 40 mg by mouth 2 (two) times daily.      . potassium chloride SA (K-DUR,KLOR-CON) 20 MEQ tablet Take 20 mEq by mouth daily.      . predniSONE (DELTASONE) 2.5 MG tablet Take 2.5 mg by mouth every other day.      . carvedilol (COREG) 6.25 MG tablet Take 6.25 mg by mouth 2 (two) times daily.        Marland Kitchen olmesartan (BENICAR) 40 MG tablet Take 0.5 tablets (20 mg total) by mouth daily.  30 tablet  11   Family History  Problem Relation Age of Onset  . Cancer Mother     colon cancer  . Multiple sclerosis Father   . Asthma Sister     in childhood  . Diabetes Father   . Hypertension     History   Social History  . Marital Status: Single    Spouse Name: N/A    Number of Children: 2  . Years of  Education: N/A   Occupational History  . 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 Smoking status: Current Every Day Smoker -- 0.5 packs/day for 20 years    Types: Cigarettes  . Smokeless tobacco: Never Used   Comment: 1 pack lasts 3 days  . Alcohol Use: No  . Drug Use: No  . Sexually Active: Yes    Birth Control/ Protection: None   Other Topics Concern  . None   Social History Narrative   Diabetic card given 05/03/2010.Financial assistance approved for 100% discount  at [facility] and has [facility] card. 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   Review of Systems: Constitutional: Denies fever, chills, diaphoresis, appetite change and fatigue.  HEENT: Denies photophobia, eye pain, redness, hearing loss, ear pain, congestion, sore throat, rhinorrhea, sneezing, mouth sores, trouble swallowing, neck pain, neck stiffness and tinnitus.   Respiratory: SOB, DOE, occasional dry cough.  History of sarcoidosis. Smoker. Denies chest tightness,  and wheezing.   Cardiovascular: Leg swelling. History of congestive heart failure. Denies chest pain, palpitations. Gastrointestinal: Obese. Denies nausea, vomiting, abdominal pain, diarrhea, constipation, blood in stool and abdominal distention.  Genitourinary: Denies dysuria, urgency, frequency, hematuria, flank pain and difficulty urinating.  Musculoskeletal: Denies myalgias, back pain, joint swelling, arthralgias and gait problem.  Skin: Denies pallor, rash and wound.  Neurological: Denies dizziness, seizures, syncope, weakness, light-headedness, numbness and headaches.  Hematological: Denies adenopathy. Easy bruising, personal or family bleeding history  Psychiatric/Behavioral: Denies suicidal ideation, mood changes, confusion, nervousness, sleep disturbance and agitation  Objective:  Physical Exam: Filed Vitals:   01/19/12 1431 01/19/12 1506  BP: 172/95 148/94  Pulse: 112 112  Temp:  97.5 F (36.4 C)   TempSrc: Oral   Height: 5\' 1"  (1.549 m)   Weight: 218 lb 4.8 oz (99.02 kg)   SpO2: 94%    Constitutional: Vital signs reviewed.  Patient is a obese female in no acute distress and cooperative with exam. Alert and oriented x3.  Head: Normocephalic and atraumatic Ear: TM normal bilaterally Mouth: no erythema or exudates, MMM Eyes: PERRLA, EOMI, conjunctivae normal, No scleral icterus.  Neck: Supple, Trachea midline normal ROM, No JVD, mass, thyromegaly, or carotid bruit present.  Cardiovascular: RRR, S1 normal, S2 normal, no MRG, pulses symmetric and intact bilaterally Pulmonary/Chest: CTAB, no wheezes, rales, or rhonchi Abdominal: Soft. Obese, Non-tender, non-distended, bowel sounds are normal, no masses, organomegaly, or guarding present.  GU: no CVA tenderness Musculoskeletal: Bilateral lower extremity +1 pitting edema. No joint deformities, erythema, or stiffness, ROM full and no nontender Hematology: no cervical, inginal, or axillary adenopathy.  Neurological: A&O x3, Strength is normal and symmetric bilaterally, cranial nerve II-XII are grossly intact, no focal motor deficit, sensory intact to light touch bilaterally.  Skin: Warm, dry and intact. No rash, cyanosis, or clubbing.  Psychiatric: Normal mood and affect. speech and behavior is normal. Judgment and thought content normal. Cognition and memory are normal.   Assessment & Plan:  Discuss case with Dr. Lynnae January  -CHF-follow up with Dr. Stanford Breed, worsening dyspnea and on exertion, continue Lasix -Hypertension-discontinue lisinopril, restart Benicar 20 mg daily, continue Coreg 6.25 mg twice a day until further evaluation and suggestions by Dr. Stanford Breed -Tobacco abuse-smoking cessation advised, offer Chantix, refer to 1 800 quit now hotline -Sarcoidosis--followup Dr. Marlene Lard -Diabetes-hemoglobin A1c 6.7, improved, continue weight loss, diet modifications, check her blood sugars 3 times a day and bring to clinic on  next followup visit -Health maintenance-flu vaccine given today, followup clinic one month will likely due Pap smear during that visit  Addendum: Social work recommended pulmonary rehab referral to be put in separately which was done.

## 2012-01-19 NOTE — Assessment & Plan Note (Signed)
Stop taking Chantix. Continues to smoke approximately one pack of cigarettes lasting for 3 days. Have counseled her extensively on smoking cessation and offered to restart Chantix which she claims she would think about and let me know on followup visit next month. She is getting tight Chantix. I also referred her to one 800 quit now hotline  -Smoking cessation strongly advised

## 2012-01-19 NOTE — Assessment & Plan Note (Signed)
Seen by Dr. Melvyn Novas as her pulmonologist and Dr. Stanford Breed is her cardiologist. Has not seen Dr. Stanford Breed lately however has seen Dr. Melvyn Novas in July. Claims he was tried on Benicar since she is coughing with lisinopril. However ran out of her trilobate Benicar and resume lisinopril and her dry cough has also resumed. She is also on Lasix 40 mg daily. And Coreg 6.25 mg twice a day.    -Followup with Dr. Stanford Breed, call and make an appointment -Continue Coreg and Benicar, discontinue lisinopril at this time -Continue to monitor blood pressure -Avoid excessive salt intake, continue diet modifications, continued exercise, continue weight loss -Followup in one month

## 2012-01-20 LAB — CBC
HCT: 26.8 % — ABNORMAL LOW (ref 36.0–46.0)
Hemoglobin: 8.6 g/dL — ABNORMAL LOW (ref 12.0–15.0)
MCH: 23.6 pg — ABNORMAL LOW (ref 26.0–34.0)
MCHC: 32.1 g/dL (ref 30.0–36.0)
MCV: 73.6 fL — ABNORMAL LOW (ref 78.0–100.0)
Platelets: 244 10*3/uL (ref 150–400)
RBC: 3.64 MIL/uL — ABNORMAL LOW (ref 3.87–5.11)
RDW: 16.8 % — ABNORMAL HIGH (ref 11.5–15.5)
WBC: 6.4 10*3/uL (ref 4.0–10.5)

## 2012-01-20 LAB — BASIC METABOLIC PANEL WITH GFR
BUN: 10 mg/dL (ref 6–23)
CO2: 37 mEq/L — ABNORMAL HIGH (ref 19–32)
Calcium: 8.3 mg/dL — ABNORMAL LOW (ref 8.4–10.5)
Chloride: 95 mEq/L — ABNORMAL LOW (ref 96–112)
Creat: 0.66 mg/dL (ref 0.50–1.10)
GFR, Est African American: >89 mL/min
GFR, Est Non African American: >89 mL/min
Glucose, Bld: 132 mg/dL — ABNORMAL HIGH (ref 70–99)
Potassium: 3.4 mEq/L — ABNORMAL LOW (ref 3.5–5.3)
Sodium: 138 mEq/L (ref 135–145)

## 2012-01-22 ENCOUNTER — Telehealth: Payer: Self-pay | Admitting: Internal Medicine

## 2012-01-22 ENCOUNTER — Other Ambulatory Visit: Payer: Self-pay | Admitting: Internal Medicine

## 2012-01-22 DIAGNOSIS — E876 Hypokalemia: Secondary | ICD-10-CM

## 2012-01-22 NOTE — Telephone Encounter (Signed)
Noted hypokalemia.  Called and spoke to Melody Gray this morning, found out she has not taken potassium supplements in one month, advised her to get refilled today and take 64meq today, and then return to usual 68meq daily and return to clinic in 7-10 days for recheck potassium.   We started her back on benicar last Friday.    Discussed with Dr. Murlean Caller

## 2012-01-24 NOTE — Progress Notes (Signed)
I saw, examined, and discussed the patient with Dr Eula Fried and agree with the note contained here. Pt states cough did better OFF lisinopril so will D/C lisinopril and resume Benicar that Dr Melvyn Novas had tried. Pt's A1C is controlled but pt's diet needs improvement. She is upfront about her high sodium, high fat, high calorie diet.

## 2012-01-25 ENCOUNTER — Encounter: Payer: Self-pay | Admitting: Licensed Clinical Social Worker

## 2012-01-25 ENCOUNTER — Telehealth: Payer: Self-pay | Admitting: Licensed Clinical Social Worker

## 2012-01-25 NOTE — Telephone Encounter (Signed)
Ms. Weisbrodt was referred to CSW for [facility] and Food bank information.  CSW placed call to Ms. Pasquarello.  Pt states she worked with Futures trader" at [facility].  CSW informed pt "Georgina Snell" left [facility] in the spring of this year and Mariel Craft is now provided coverage.  CSW will forward pt's information to Ms. Stang to follow.  CSW and pt discussed pt's current needs.  Pt states she is in need of food bank, pantry and hot meals information.  Pt requesting CSW to place information in the mail.  CSW also discussed [facility] nutrition program and informed Ms. Brege to notify Ms. Stang if pt wanted referral to [facility] nutrition program.  Pt denies add'l needs at this time.  CSW will sign off.

## 2012-01-26 ENCOUNTER — Other Ambulatory Visit: Payer: Self-pay

## 2012-01-26 ENCOUNTER — Other Ambulatory Visit: Payer: Self-pay | Admitting: Cardiology

## 2012-01-31 ENCOUNTER — Other Ambulatory Visit: Payer: Self-pay | Admitting: Internal Medicine

## 2012-01-31 NOTE — Addendum Note (Signed)
Addended by: Wilber Oliphant on: 01/31/2012 10:38 AM   Modules accepted: Orders

## 2012-02-12 ENCOUNTER — Ambulatory Visit (INDEPENDENT_AMBULATORY_CARE_PROVIDER_SITE_OTHER): Payer: Medicare Other | Admitting: Internal Medicine

## 2012-02-12 ENCOUNTER — Other Ambulatory Visit (INDEPENDENT_AMBULATORY_CARE_PROVIDER_SITE_OTHER): Payer: Medicare Other

## 2012-02-12 ENCOUNTER — Encounter: Payer: Self-pay | Admitting: Internal Medicine

## 2012-02-12 VITALS — BP 124/80 | HR 99 | Temp 98.1°F | Ht 61.0 in | Wt 213.4 lb

## 2012-02-12 DIAGNOSIS — E876 Hypokalemia: Secondary | ICD-10-CM | POA: Diagnosis not present

## 2012-02-12 DIAGNOSIS — D869 Sarcoidosis, unspecified: Secondary | ICD-10-CM | POA: Diagnosis not present

## 2012-02-12 DIAGNOSIS — I2789 Other specified pulmonary heart diseases: Secondary | ICD-10-CM | POA: Diagnosis not present

## 2012-02-12 DIAGNOSIS — J9610 Chronic respiratory failure, unspecified whether with hypoxia or hypercapnia: Secondary | ICD-10-CM | POA: Diagnosis not present

## 2012-02-12 LAB — BASIC METABOLIC PANEL WITH GFR
BUN: 11 mg/dL (ref 6–23)
CO2: 30 mEq/L (ref 19–32)
Calcium: 9.9 mg/dL (ref 8.4–10.5)
Chloride: 97 mEq/L (ref 96–112)
Creat: 0.77 mg/dL (ref 0.50–1.10)
GFR, Est African American: >89 mL/min
GFR, Est Non African American: >89 mL/min
Glucose, Bld: 128 mg/dL — ABNORMAL HIGH (ref 70–99)
Potassium: 3.8 mEq/L (ref 3.5–5.3)
Sodium: 138 mEq/L (ref 135–145)

## 2012-02-12 NOTE — Progress Notes (Signed)
Subjective:     Patient ID: Melody Gray, female   DOB: 1970-06-11   MRN: [id number]  HPI  64 yobf  Smoker dx of sarcoidosis in 2001 = sob, cough became 02 dep in July 2011 last off prednsione March 2011 but daily since then and complicated by [facility]    January 06, 2010 ov  c/o increased SOB. Pt states outpt clinic advised her to follow up with Dr. Melvyn Novas for Sacoidosis. doe on 02 at 2 lpm sitting then 4 with activity gets off at the curb struggles with grocery store. Try taking Prednisone one half even days in am with breakfast  Be sure you take omeprazole Take one 30-60 min before first and last meals of the day  Stop nifedipine  Start Cardizem 240 mg once daily  Wear 24 hours per day, 2 lpm at rest and sleeping, 4lpm with activity, this is the best way to help your heart function better  CT Chest ( repeated with contrast)> no PE, just sarcoid changes   February 09, 2010 Followup sarcoid w/ PFTs. Breathing is the same- no better or worse.on 02 2 lpm w/in and then outside using 4lpm> cc doe x 50 ft still has to stop every aisle at [employer] but not [facility] parking. rec Start Dulera 2 puffs first thing in am and 2 puffs again in pm about 12 hours later and prednisone 10 mg every other day.   May 04, 2010 ov cc doe some better to point to where feels needs 02 less. rec  1) Stay on dulera 200 2 puffs first thing in am and 2 puffs again in pm about 12 hours later  2) Work on perfecting inhaler technique 3) Prednisone 20 mg 1/4 every other day  4) Stay on 02 2lpm 24 hours per day except increase to 4lpm with exercise  5) Weight control t thru ex    10/05/2010 ov/Wert back smoking and using 02 most of the time at 2 lpm never up to 4 and prednisone 10mg   One half every other day.  rec 1) Stay on dulera 200 2 puffs first thing in am and 2 puffs again in pm about 12 hours later  2) Work on perfecting inhaler technique: relax and blow all the way out then take a nice smooth deep breath back in,  triggering the inhaler at same time you start breathing  3) Prednisone 10  mg 1/2 every other day  4) Stay on 02 2lpm 24 hours per day except increase to 4lpm with exercise  5) Weight control   thru exercise on 02 4lpm 6) Please see patient coordinator before you leave today  to schedule repeat echo and we will call when it comes back for review   It is critical that you not smoke at all      02/02/2011 f/u ov/Wert cc no change doe,  Minimal cough, now on ace and coreg. No excess mucus. rec Change your 02 to 2lpm at bedtime and with any activity outside your house    08/21/2011 f/u ov/Wert still smoking but on chantix  cc doe x "up to a mile s stopping - off dulera and 02" but last did this one month prior to OV  - no cough or overt sinus or hb symptoms. rec Wear 02 at 2lpm whenever exercise outside your house and always when you sleep.        Admit date: 11/09/2011  Discharge date: 11/10/2011  PCP: Jerene Pitch, MD  Consultants:Interventional Radiology, ENT  Admitting Diagnosis: Epistaxis  Discharge Diagnosis:  Principal Problem:  *Epistaxis  Secondary Problems:  Sarcoidosis  DIABETES MELLITUS, TYPE II, UNCONTROLLED  Anemia in chronic kidney disease  HYPERTENSION  CONGESTIVE HEART FAILURE  Pulmonary hypertension associated with sarcoidosis  Acute and chronic respiratory failure  Hospital Course  , Intermittent epistaxis for several months w/ previous hospitalization for embolization (09/14/11 by Dr Bryon Lions) admitted with heavy epistaxis. Admitted by IR who performed bilateral superselective embolization of abnormal bilat IMAX branches and abnormal nasal branches of facial artery. Performed under general anesthesia secondary to patient being unable to protect airway from blood loss. Patient transported to MICU post op due to acute and chronic respiratory failure. Patient was placed on stress Patient extubated on 7/12 without difficulty. Other respiratory issues included  likely obesity hypoventilation, sarcoidosis. Patient wasplaced on stress dose steroids while intubated which were converted back to her home PO meds for sarcoid when able to tolerate PO. Marland Kitchen Patient's bleeding increased after extubation. ENT was called and placed bilateral AP packing with 10cm Merocel packs. Due to continued severe epistaxis nonresponsive to multiple cauterization and embolization attempts decision was made to transport to a rhinologist atfor further evaluation and treatment. Patient's hgb decreased from 9.8 on admit to 8.7 in AM before discharge. Patient did become mildly tachycardic with increasing blood loss.  Other active issues  1. HTN-patient restarted on home meds (Lasix, Coreg, and Lisinopril) once able to tolerate PO. Blood pressure likely elevated due to patient distress. Prn Hydralazine ordered on top of home meds.  2. GERD and H. Pylori Hx. Continued on PPI.  3. DM-glucose well controlled on sliding scale. On novolog 70/30 at home-this was initially held and restarted when tolerating PO   11/21/2011 f/u ov/Wert still smoking and 2.5 mg pred qod cc not limited by breathing as long as wears 2lpm with activity, minimal cough improving since et , did not take  bp meds on day of ov rec Stop lisinopril and for the next month take benicar 40 mg one half daily to see if it makes any difference in any of your respiratory symptoms and if not ok to lisinopril when samples run out The key is to stop smoking completely before smoking completely stops you! Please schedule a follow up visit in 3 months but call sooner if needed  Late add:  Try pred 2.5 mg p 3 days   02/12/2012 still smoking f/u ov/Wert much better, no cough off acei cc no change doe @ prednisone q 3 days, walking 15 min on 02 - no change on days when misses prednisone.     Sleeping ok on 02 without nocturnal  or early am exacerbation  of respiratory  c/o's or need for noct saba. Also denies any obvious fluctuation of  symptoms with weather or environmental changes or other aggravating or alleviating factors except as outlined above   ROS  The following are not active complaints unless bolded sore throat, dysphagia, dental problems, itching, sneezing,  nasal congestion or excess/ purulent secretions, ear ache,   fever, chills, sweats, unintended wt loss, pleuritic or exertional cp, hemoptysis,  orthopnea pnd or leg swelling, presyncope, palpitations, heartburn, abdominal pain, anorexia, nausea, vomiting, diarrhea  or change in bowel or urinary habits, change in stools or urine, dysuria,hematuria,  rash, arthralgias, visual complaints, headache, numbness weakness or ataxia or problems with walking or coordination,  change in mood/affect or memory.         Past Medical History:  Sarcoidosis (Dr Wert)-w/ liver involvement per biopsy 03/2008 - Reversible airway component so start [facility] 01/2010 > better  - HFa 75% p coaching May 04, 2010  - ? Off prednisone 05/2011 Unexplained Hypoxemia July 2011  - CT angiogram 01/07/10 >>> no PE  - PFT's February 09, 2010 FEV1 1.20 (49%) with ratio 72 and FRC 90%, FEV1  16% better p B2, DLC0 33% > corrects to 84   Morbid obesity  - Target wt = 153 for BMI < 30  QT prolongation  Diabetes mellitus, type II  Hypertension  Heart catheterization 06-06-06 : No CAD, no RAS, normal EF  Hx eclampsia  Hx of scalp seborrheic dermatitis            Objective:   Physical Exam She is a minimally  cushingoid-appearing amb bf nad off 02  wt 238 January 06, 2010 >   217 May 04, 2010 > 218 11/21/11> 02/12/2012  213  There is no stigmata liver disease  skin: anicteric  HEENT: normocephalic; PEERLA; no nasal or pharyngeal abnormalities  neck: supple  nodes: no cervical lymphadenopathy  chest: clear to ausculatation and percussion  heart: no murmurs, gallops, or rubs  abd: soft, nontender; BS normoactive; no abdominal masses, tenderness, organomegaly; abdomen is obese    cxr  05/10/11 1. Cardiomegaly without failure.  2. Low volume chest with basilar atelectasis.  3. Prominence of the right pulmonary hilum, likely secondary to  clinical history of sarcoidosis.     Assessment:          Plan:

## 2012-02-12 NOTE — Addendum Note (Signed)
Addended by: Truddie Crumble on: 02/12/2012 02:00 PM   Modules accepted: Orders

## 2012-02-12 NOTE — Patient Instructions (Addendum)
Try off prednisone completely - call if breathing worsens or cough starts back or nausea/ weakness develop  Weight control is simply a matter of calorie balance which needs to be tilted in your favor by eating less and exercising more.  To get the most out of exercise, you need to be continuously aware that you are short of breath, but never out of breath, for 30 minutes daily. As you improve, it will actually be easier for you to do the same amount of exercise on 02  in  30 minutes so always push to the level where you are short of breath.     Please schedule a follow up office visit in 6 weeks, call sooner if needed  Late add walking sat ra and cxr on return

## 2012-02-13 NOTE — Assessment & Plan Note (Signed)
-   PFT's 11/21/2011 FEV1 1.23 (50%) ratio 67 and no better p B2,  DLCO 31 corrects to 73   - trial off prednisone 02/12/2012   The goal with a chronic steroid dependent illness is always arriving at the lowest effective dose that controls the disease/symptoms and not accepting a set "formula" which is based on statistics or guidelines that don't always take into account patient  variability or the natural hx of the dz in every individual patient, which may well vary over time.  For now therefore I recommend the patient maintain  Off prednisone if at all possible

## 2012-02-13 NOTE — Assessment & Plan Note (Signed)
-   repeat echo 10/14/10 LV much worse than RV so rx as L Ht failure by cards/Crenshaw  Well compensated and tolerating coreg ok despite previous concerns she might have asthmatic  component

## 2012-02-13 NOTE — Assessment & Plan Note (Signed)
-    Complicated by PAH - Walked 3 laps @ 185 ft each stopped due to  End of test, no desat on 4lpm 10/05/2010  - 02/02/2011  Walked RA  2 laps @ 185 ft each stopped due to  desat to 88 so change rx to 2lpm with activity outside the house and at hs - 08/21/2011  Walked RA x 1 laps @ 185 ft each stopped due to desat 85% - 11/21/2011  Walked RA  2 laps @ 185 ft each stopped due to  Sat 88  Reviewed need to always walk on 02 at 4lpm to help improve cal balance

## 2012-02-27 ENCOUNTER — Encounter: Payer: Self-pay | Admitting: Internal Medicine

## 2012-03-01 ENCOUNTER — Other Ambulatory Visit: Payer: Self-pay | Admitting: *Deleted

## 2012-03-04 MED ORDER — FUROSEMIDE 40 MG PO TABS: 40.0000 mg | ORAL_TABLET | Freq: Every day | ORAL | Status: DC

## 2012-03-07 ENCOUNTER — Encounter: Payer: Self-pay | Admitting: Cardiology

## 2012-03-07 ENCOUNTER — Ambulatory Visit (INDEPENDENT_AMBULATORY_CARE_PROVIDER_SITE_OTHER): Payer: Medicare Other | Admitting: Cardiology

## 2012-03-07 ENCOUNTER — Encounter: Payer: Self-pay | Admitting: Internal Medicine

## 2012-03-07 VITALS — BP 152/92 | HR 95 | Ht 61.0 in | Wt 211.0 lb

## 2012-03-07 DIAGNOSIS — F17200 Nicotine dependence, unspecified, uncomplicated: Secondary | ICD-10-CM

## 2012-03-07 DIAGNOSIS — I429 Cardiomyopathy, unspecified: Secondary | ICD-10-CM

## 2012-03-07 DIAGNOSIS — I10 Essential (primary) hypertension: Secondary | ICD-10-CM

## 2012-03-07 DIAGNOSIS — I428 Other cardiomyopathies: Secondary | ICD-10-CM | POA: Diagnosis not present

## 2012-03-07 MED ORDER — CARVEDILOL 12.5 MG PO TABS: 12.5000 mg | ORAL_TABLET | Freq: Two times a day (BID) | ORAL | Status: DC

## 2012-03-07 NOTE — Assessment & Plan Note (Signed)
Blood pressure elevated. Increase carvedilol to 12.5 mg by mouth twice a day. Monitor blood pressure at home and continue to increase medications as needed.

## 2012-03-07 NOTE — Patient Instructions (Addendum)
Your physician recommends that you schedule a follow-up appointment in: [place] has requested that you have an echocardiogram. Echocardiography is a painless test that uses sound waves to create images of your heart. It provides your doctor with information about the size and shape of your heart and how well your heart's chambers and valves are working. This procedure takes approximately one hour. There are no restrictions for this procedure.   INCREASE CARVEDILOL TO 12.5 MG ONE TABLET TWICE DAILY  TRACK BP AT HOME

## 2012-03-07 NOTE — Assessment & Plan Note (Signed)
Patient counseled on discontinuing her tobacco use.

## 2012-03-07 NOTE — Assessment & Plan Note (Signed)
Possibly related to hypertension. I will increase carvedilol to 12.5 mg by mouth twice a day. Repeat echocardiogram to see if LV function has improved.

## 2012-03-07 NOTE — Progress Notes (Signed)
HPI: Pleasant female I saw in July of 2012 for evaluation of abnormal echocardiogram. Echocardiogram in June of 2012 revealed an ejection fraction of 35% which was new. There was mild biatrial enlargement, mild right ventricular enlargement and mildly reduced RV function. Cardiac MRI in July of 2012 revealed Moderate to severe LV systolic dysfunction, EF [id number]. The anterior and anterolateral walls appeared severely hypokinetic. Mild RV dilation with mild systolic dysfunction. Possible small areas of mid wall delayed enhancement in the apical septal and apical lateral walls. These areas were very faint and not definitely of clinical significance. Mid wall enhancement can be found in infiltrative cardiomyopathies such as that due to sarcoidosis. TSH and ferritin normal. Cardiac catheterization repeated in September of 2012 and showed an ejection fraction of 40%. There was no coronary disease. Since I last saw her in March of 2013, she has dyspnea on exertion but no orthopnea, PND, pedal edema, syncope or chest pain.   Current Outpatient Prescriptions  Medication Sig Dispense Refill  . carvedilol (COREG) 6.25 MG tablet take 1 tablet by mouth twice a day  30 tablet  6  . furosemide (LASIX) 40 MG tablet Take 1 tablet (40 mg total) by mouth daily.  30 tablet  2  . insulin aspart protamine-insulin aspart (NOVOLOG 70/30) (70-30) 100 UNIT/ML injection Inject 23-25 Units into the skin 2 (two) times daily with a meal. Inject 25 units before breakfast and inject 23 units before dinner.  20 mL  5  . metFORMIN (GLUCOPHAGE) 500 MG tablet Take 500 mg by mouth 2 (two) times daily with a meal.       . olmesartan (BENICAR) 40 MG tablet Take 0.5 tablets (20 mg total) by mouth daily.  30 tablet  11  . omeprazole (PRILOSEC) 20 MG capsule Take 20 mg by mouth 2 (two) times daily.       . potassium chloride SA (K-DUR,KLOR-CON) 20 MEQ tablet Take 20 mEq by mouth daily.         Past Medical History  Diagnosis Date  .  Sarcoidosis     Followed by Dr. Melvyn Novas; w/ liver involvement per biopsy 12/09, Reversible airway component so started on [facility] 01/2010; HFA 75% p coaching 05/2010  . Hypoxemia     CT angiogram 9/11>> No PE; PFTs 10/11- FEV1 1.20 (49%) with 16% better p B2, DLCO 33%> corrects to 84; O2 sats ok on 4 lpm X rapid walk X 3 laps 05/2010  . Morbid obesity     Target wt= 153 for BMI <30  . QT prolongation   . Diabetes mellitus   . Hypertension   . Hx of cardiac cath 2/08    No CAD, no RAS,  normal EF  . Seborrheic dermatitis of scalp   . 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 Hep B serology neg. but Hep C ab +, HIV neg. 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 Cardiomyopathy, nonischemic     EF 45% 12/10; Echo 7/11 normal EF, PAS 48  . Diabetic retinopathy(362.0)     Right eye 2/11  . Health maintenance examination     Mammogram 05/2010 Negative; Last Pap smear 03/2008; Last DM eye exam 2/11> mild non-proliferative diabetic retinopathy. OD  . Helicobacter pylori ab+ 05/2011    Pt was symptomatic and treatment planned for 05/2011    Past Surgical History  Procedure Date  . Tubal ligation   .  Breast surgery   . Cesarean section     History   Social History  . Marital Status: Single    Spouse Name: N/A    Number of Children: 2  . Years of Education: N/A   Occupational History  . 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 Smoking status: Current Every Day Smoker -- 0.5 packs/day for 20 years    Types: Cigarettes  . Smokeless tobacco: Never Used     Comment: 1 pack lasts 3 days  . Alcohol Use: No  . Drug Use: No  . Sexually Active: Yes    Birth Control/ Protection: None   Other Topics Concern  . Not on file   Social History Narrative   Diabetic card given 05/03/2010.Financial assistance approved for 100% discount at [facility] and has [facility] card. 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    ROS: no fevers or chills, productive cough, hemoptysis, dysphasia, odynophagia, melena, hematochezia, dysuria, hematuria, rash, seizure activity, orthopnea, PND, pedal edema, claudication. Remaining systems are negative.  Physical Exam: Well-developed obese in no acute distress.  Skin is warm and dry.  HEENT is normal.  Neck is supple.  Chest with mild rhonchi Cardiovascular exam is regular rate and rhythm.  Abdominal exam nontender or distended. No masses palpated. Extremities show no edema. neuro grossly intact  ECG sinus rhythm at a rate of 95. No ST changes.

## 2012-03-12 ENCOUNTER — Encounter: Payer: Self-pay | Admitting: Dietician

## 2012-03-12 NOTE — Progress Notes (Signed)
Patient ID: Melody Gray, female   DOB: [birth year], 41 y.o.   MRN: [id number] Patient was counseled to quit smoking on 08/08/11 & 03/07/12. CDE called her to reassess readiness to quit: she says she plans to talk with Dr. Eula Fried about Chantix and does not have a quit date, but knows she needs to set one. CDE encouraged her for having a plan to quit and offered assistance as needed.

## 2012-03-13 ENCOUNTER — Other Ambulatory Visit (HOSPITAL_COMMUNITY): Payer: Self-pay

## 2012-03-19 DIAGNOSIS — N179 Acute kidney failure, unspecified: Secondary | ICD-10-CM | POA: Diagnosis not present

## 2012-03-19 DIAGNOSIS — D869 Sarcoidosis, unspecified: Secondary | ICD-10-CM | POA: Diagnosis not present

## 2012-03-19 DIAGNOSIS — I509 Heart failure, unspecified: Secondary | ICD-10-CM | POA: Diagnosis not present

## 2012-03-19 DIAGNOSIS — K7460 Unspecified cirrhosis of liver: Secondary | ICD-10-CM | POA: Diagnosis not present

## 2012-03-20 ENCOUNTER — Encounter: Payer: Self-pay | Admitting: Internal Medicine

## 2012-03-20 ENCOUNTER — Ambulatory Visit (HOSPITAL_COMMUNITY): Payer: Medicare Other | Attending: Cardiology | Admitting: Radiology

## 2012-03-20 DIAGNOSIS — I429 Cardiomyopathy, unspecified: Secondary | ICD-10-CM

## 2012-03-20 DIAGNOSIS — I517 Cardiomegaly: Secondary | ICD-10-CM | POA: Diagnosis not present

## 2012-03-20 DIAGNOSIS — D869 Sarcoidosis, unspecified: Secondary | ICD-10-CM | POA: Diagnosis not present

## 2012-03-20 DIAGNOSIS — I10 Essential (primary) hypertension: Secondary | ICD-10-CM | POA: Insufficient documentation

## 2012-03-20 DIAGNOSIS — F17200 Nicotine dependence, unspecified, uncomplicated: Secondary | ICD-10-CM | POA: Insufficient documentation

## 2012-03-20 DIAGNOSIS — E119 Type 2 diabetes mellitus without complications: Secondary | ICD-10-CM | POA: Diagnosis not present

## 2012-03-20 DIAGNOSIS — I428 Other cardiomyopathies: Secondary | ICD-10-CM | POA: Insufficient documentation

## 2012-03-20 NOTE — Progress Notes (Signed)
Echocardiogram performed.  

## 2012-03-21 ENCOUNTER — Encounter: Payer: Self-pay | Admitting: Internal Medicine

## 2012-03-21 ENCOUNTER — Telehealth: Payer: Self-pay | Admitting: Internal Medicine

## 2012-03-21 NOTE — Telephone Encounter (Signed)
Per MW- ONO shows pt still needs to use o2 with sleep.  ATC with results, NA and LMTCB

## 2012-03-22 NOTE — Telephone Encounter (Signed)
LMTCB

## 2012-03-25 ENCOUNTER — Encounter: Payer: Self-pay | Admitting: Internal Medicine

## 2012-03-25 NOTE — Progress Notes (Signed)
 This encounter was created in error - please disregard.

## 2012-03-29 NOTE — Telephone Encounter (Signed)
Spoke with pt and notified of results per Dr. Wert. Pt verbalized understanding and denied any questions. 

## 2012-04-03 ENCOUNTER — Telehealth (HOSPITAL_COMMUNITY): Payer: Self-pay | Admitting: *Deleted

## 2012-04-03 ENCOUNTER — Telehealth: Payer: Self-pay | Admitting: Internal Medicine

## 2012-04-03 NOTE — Telephone Encounter (Signed)
Last seen by MW 10.14.13, follow up in 6 weeks.  Pt had noshowed for 11.25.13 ov w/ MW and this was rescheduled to tomorrow 12.5.13 @ 0945.  Called spoke with patient, informed her of the above.  Pt okay with this and will keep her upcoming ov.  Nothing further needed; will sign off.

## 2012-04-04 ENCOUNTER — Encounter: Payer: Self-pay | Admitting: Internal Medicine

## 2012-04-04 ENCOUNTER — Ambulatory Visit (INDEPENDENT_AMBULATORY_CARE_PROVIDER_SITE_OTHER)
Admission: RE | Admit: 2012-04-04 | Discharge: 2012-04-04 | Disposition: A | Discharge status: Home / Self Care | Payer: Medicare Other | Source: Ambulatory Visit | Attending: Internal Medicine | Admitting: Internal Medicine

## 2012-04-04 ENCOUNTER — Ambulatory Visit (INDEPENDENT_AMBULATORY_CARE_PROVIDER_SITE_OTHER): Payer: Medicare Other | Admitting: Internal Medicine

## 2012-04-04 VITALS — BP 120/80 | HR 97 | Temp 98.5°F | Ht 61.0 in | Wt 215.4 lb

## 2012-04-04 DIAGNOSIS — J9610 Chronic respiratory failure, unspecified whether with hypoxia or hypercapnia: Secondary | ICD-10-CM

## 2012-04-04 DIAGNOSIS — F17200 Nicotine dependence, unspecified, uncomplicated: Secondary | ICD-10-CM

## 2012-04-04 DIAGNOSIS — D869 Sarcoidosis, unspecified: Secondary | ICD-10-CM | POA: Diagnosis not present

## 2012-04-04 DIAGNOSIS — J984 Other disorders of lung: Secondary | ICD-10-CM | POA: Diagnosis not present

## 2012-04-04 NOTE — Assessment & Plan Note (Signed)
-   PFT's 11/21/2011 FEV1 1.23 (50%) ratio 67 and no better p B2,  DLCO 31 corrects to 73   - trial off prednisone 02/12/2012   No flare off prednisone either in terms of cxr or symptoms  The goal with a chronic steroid dependent illness is always arriving at the lowest effective dose that controls the disease/symptoms and not accepting a set "formula" which is based on statistics or guidelines that don't always take into account patient  variability or the natural hx of the dz in every individual patient, which may well vary over time.  For now therefore I recommend the patient maintain  A floor of Zero

## 2012-04-04 NOTE — Progress Notes (Signed)
Subjective:     Patient ID: Melody Gray, female   DOB: 1970/06/18   MRN: [id number]  HPI  22 yobf  Smoker dx of sarcoidosis in 2001 = sob, cough became 02 dep in July 2011 last off prednsione March 2011 but daily since then and complicated by [facility]    January 06, 2010 ov  c/o increased SOB. Pt states outpt clinic advised her to follow up with Dr. Melvyn Novas for Sacoidosis. doe on 02 at 2 lpm sitting then 4 with activity gets off at the curb struggles with grocery store. Try taking Prednisone one half even days in am with breakfast  Be sure you take omeprazole Take one 30-60 min before first and last meals of the day  Stop nifedipine  Start Cardizem 240 mg once daily  Wear 24 hours per day, 2 lpm at rest and sleeping, 4lpm with activity, this is the best way to help your heart function better  CT Chest ( repeated with contrast)> no PE, just sarcoid changes   February 09, 2010 Followup sarcoid w/ PFTs. Breathing is the same- no better or worse.on 02 2 lpm w/in and then outside using 4lpm> cc doe x 50 ft still has to stop every aisle at [employer] but not [facility] parking. rec Start Dulera 2 puffs first thing in am and 2 puffs again in pm about 12 hours later and prednisone 10 mg every other day.   May 04, 2010 ov cc doe some better to point to where feels needs 02 less. rec  1) Stay on dulera 200 2 puffs first thing in am and 2 puffs again in pm about 12 hours later  2) Work on perfecting inhaler technique 3) Prednisone 20 mg 1/4 every other day  4) Stay on 02 2lpm 24 hours per day except increase to 4lpm with exercise  5) Weight control t thru ex    10/05/2010 ov/Melody Gray back smoking and using 02 most of the time at 2 lpm never up to 4 and prednisone 10mg   One half every other day.  rec 1) Stay on dulera 200 2 puffs first thing in am and 2 puffs again in pm about 12 hours later  2) Work on perfecting inhaler technique: relax and blow all the way out then take a nice smooth deep breath back in,  triggering the inhaler at same time you start breathing  3) Prednisone 10  mg 1/2 every other day  4) Stay on 02 2lpm 24 hours per day except increase to 4lpm with exercise  5) Weight control   thru exercise on 02 4lpm 6) Please see patient coordinator before you leave today  to schedule repeat echo and we will call when it comes back for review   It is critical that you not smoke at all      02/02/2011 f/u ov/Melody Gray cc no change doe,  Minimal cough, now on ace and coreg. No excess mucus. rec Change your 02 to 2lpm at bedtime and with any activity outside your house    08/21/2011 f/u ov/Melody Gray still smoking but on chantix  cc doe x "up to a mile s stopping - off dulera and 02" but last did this one month prior to OV  - no cough or overt sinus or hb symptoms. rec Wear 02 at 2lpm whenever exercise outside your house and always when you sleep.        Admit date: 11/09/2011  Discharge date: 11/10/2011  PCP: Jerene Pitch, MD  Consultants:Interventional Radiology, ENT  Admitting Diagnosis: Epistaxis  Discharge Diagnosis:  Principal Problem:  *Epistaxis  Secondary Problems:  Sarcoidosis  DIABETES MELLITUS, TYPE II, UNCONTROLLED  Anemia in chronic kidney disease  HYPERTENSION  CONGESTIVE HEART FAILURE  Pulmonary hypertension associated with sarcoidosis  Acute and chronic respiratory failure  Hospital Course  , Intermittent epistaxis for several months w/ previous hospitalization for embolization (09/14/11 by Dr Bryon Lions) admitted with heavy epistaxis. Admitted by IR who performed bilateral superselective embolization of abnormal bilat IMAX branches and abnormal nasal branches of facial artery. Performed under general anesthesia secondary to patient being unable to protect airway from blood loss. Patient transported to MICU post op due to acute and chronic respiratory failure. Patient was placed on stress Patient extubated on 7/12 without difficulty. Other respiratory issues included  likely obesity hypoventilation, sarcoidosis. Patient wasplaced on stress dose steroids while intubated which were converted back to her home PO meds for sarcoid when able to tolerate PO. Marland Kitchen Patient's bleeding increased after extubation. ENT was called and placed bilateral AP packing with 10cm Merocel packs. Due to continued severe epistaxis nonresponsive to multiple cauterization and embolization attempts decision was made to transport to a rhinologist atfor further evaluation and treatment. Patient's hgb decreased from 9.8 on admit to 8.7 in AM before discharge. Patient did become mildly tachycardic with increasing blood loss.  Other active issues  1. HTN-patient restarted on home meds (Lasix, Coreg, and Lisinopril) once able to tolerate PO. Blood pressure likely elevated due to patient distress. Prn Hydralazine ordered on top of home meds.  2. GERD and H. Pylori Hx. Continued on PPI.  3. DM-glucose well controlled on sliding scale. On novolog 70/30 at home-this was initially held and restarted when tolerating PO   11/21/2011 f/u ov/Melody Gray still smoking and 2.5 mg pred qod cc not limited by breathing as long as wears 2lpm with activity, minimal cough improving since et , did not take  bp meds on day of ov rec Stop lisinopril and for the next month take benicar 40 mg one half daily to see if it makes any difference in any of your respiratory symptoms and if not ok to lisinopril when samples run out The key is to stop smoking completely before smoking completely stops you! Please schedule a follow up visit in 3 months but call sooner if needed  Late add:  Try pred 2.5 mg p 3 days   02/12/2012 still smoking f/u ov/Melody Gray much better, no cough off acei cc no change doe @ prednisone q 3 days, walking 15 min on 02 - no change on days when misses prednisone.   rec Try off prednisone completely - call if breathing worsens or cough starts back or nausea/ weakness develop Weight control by neg cal balance Late  add walking sat ra and cxr on return   03/25/2012 f/u ov/Melody Gray cc rescheduled  04/04/2012 f/u ov/Melody Gray cc doe improving on 02, No obvious daytime variabilty or assoc chronic cough or cp or chest tightness, subjective wheeze overt sinus or hb symptoms. No unusual exp hx or h/o childhood pna/ asthma or premature birth to his knowledge.    Sleeping ok on 02 without nocturnal  or early am exacerbation  of respiratory  c/o's or need for noct saba. Also denies any obvious fluctuation of symptoms with weather or environmental changes or other aggravating or alleviating factors except as outlined above   ROS  The following are not active complaints unless bolded sore throat, dysphagia, dental problems, itching, sneezing,  nasal congestion or excess/ purulent secretions, ear ache,   fever, chills, sweats, unintended wt loss, pleuritic or exertional cp, hemoptysis,  orthopnea pnd or leg swelling, presyncope, palpitations, heartburn, abdominal pain, anorexia, nausea, vomiting, diarrhea  or change in bowel or urinary habits, change in stools or urine, dysuria,hematuria,  rash, arthralgias, visual complaints, headache, numbness weakness or ataxia or problems with walking or coordination,  change in mood/affect or memory.         Past Medical History:  Sarcoidosis (Dr Raeleigh Guinn)-w/ liver involvement per biopsy 03/2008 - Reversible airway component so start [facility] 01/2010 > better  - HFa 75% p coaching May 04, 2010  - ? Off prednisone 05/2011 Unexplained Hypoxemia July 2011  - CT angiogram 01/07/10 >>> no PE  - PFT's February 09, 2010 FEV1 1.20 (49%) with ratio 72 and FRC 90%, FEV1  16% better p B2, DLC0 33% > corrects to 84   Morbid obesity  - Target wt = 153 for BMI < 30  QT prolongation  Diabetes mellitus, type II  Hypertension  Heart catheterization 06-06-06 : No CAD, no RAS, normal EF  Hx eclampsia  Hx of scalp seborrheic dermatitis            Objective:   Physical Exam She is a minimally   cushingoid-appearing amb bf nad off 02  wt 238 January 06, 2010 >   217 May 04, 2010 >  02/12/2012  213 >  215 04/04/2012  There is no stigmata liver disease  skin: anicteric  HEENT: normocephalic; PEERLA; no nasal or pharyngeal abnormalities  neck: supple  nodes: no cervical lymphadenopathy  chest: clear to ausculatation and percussion  heart: no murmurs, gallops, or rubs  abd: soft, nontender; BS normoactive; no abdominal masses, tenderness, organomegaly; abdomen is obese    CXR  04/04/2012 :  There is hilar and mediastinal adenopathy consistent with sarcoid. Mild scarring in the lung bases.        Assessment:          Plan:

## 2012-04-04 NOTE — Assessment & Plan Note (Addendum)
>   3 min discussion  I emphasized that although we never turn away smokers from the pulmonary clinic, we do ask that they understand that the recommendations that we make  won't work nearly as well in the presence of continued cigarette exposure.  In fact, we may very well  reach a point where we can't promise to help the patient if  she can't quit smoking. (We can and will promise to try to help, we just can't promise what we recommend will really work)   Plans to see Dr Eula Fried for chantix trial, strongly encouraged her to fully committ to quit and maintain off cigs

## 2012-04-04 NOTE — Progress Notes (Signed)
Quick Note:  ATC patient, no answer [id number] ______ 

## 2012-04-04 NOTE — Assessment & Plan Note (Signed)
-    Complicated by PAH - Walked 3 laps @ 185 ft each stopped due to  End of test, no desat on 4lpm 10/05/2010  - 02/02/2011  Walked RA  2 laps @ 185 ft each stopped due to  desat to 88 so change rx to 2lpm with activity outside the house and at hs - 08/21/2011  Walked RA x 1 laps @ 185 ft each stopped due to desat 85% - 11/21/2011  Walked RA  2 laps @ 185 ft each stopped due to  Sat 88 - ONO RA < 89% x 6h 26 min 03/19/12 > repeat on 2lpm ordered 04/04/2012

## 2012-04-04 NOTE — Patient Instructions (Addendum)
Please see patient coordinator before you leave today  to schedule overnight oximetry on 2lpm  Please remember to go to the  x-ray department downstairs for your tests - we will call you with the results when they are available.  Please schedule a follow up visit in 3 months but call sooner if needed

## 2012-04-10 ENCOUNTER — Encounter: Payer: Self-pay | Admitting: Internal Medicine

## 2012-04-10 ENCOUNTER — Telehealth: Payer: Self-pay | Admitting: Internal Medicine

## 2012-04-10 NOTE — Telephone Encounter (Signed)
Per MW- ONO on 2lpm looks good, needs to cont nocturnal o2 at 2lpm. Spoke with pt and notified of results/recs per Dr. Melvyn Novas. Pt verbalized understanding and denied any questions.

## 2012-04-10 NOTE — Progress Notes (Signed)
Quick Note:  Spoke with pt and notified of results per Dr. Wert. Pt verbalized understanding and denied any questions.  ______ 

## 2012-04-16 ENCOUNTER — Other Ambulatory Visit (HOSPITAL_COMMUNITY)
Admission: RE | Admit: 2012-04-16 | Discharge: 2012-04-16 | Disposition: A | Discharge status: Home / Self Care | Payer: Medicare Other | Source: Ambulatory Visit | Attending: Internal Medicine | Admitting: Internal Medicine

## 2012-04-16 ENCOUNTER — Encounter: Payer: Self-pay | Admitting: Internal Medicine

## 2012-04-16 ENCOUNTER — Ambulatory Visit (INDEPENDENT_AMBULATORY_CARE_PROVIDER_SITE_OTHER): Payer: Medicare Other | Admitting: Internal Medicine

## 2012-04-16 VITALS — BP 139/81 | HR 90 | Temp 98.5°F | Ht 61.0 in | Wt 218.1 lb

## 2012-04-16 DIAGNOSIS — N760 Acute vaginitis: Secondary | ICD-10-CM | POA: Diagnosis not present

## 2012-04-16 DIAGNOSIS — Z113 Encounter for screening for infections with a predominantly sexual mode of transmission: Secondary | ICD-10-CM | POA: Diagnosis not present

## 2012-04-16 DIAGNOSIS — IMO0001 Reserved for inherently not codable concepts without codable children: Secondary | ICD-10-CM | POA: Diagnosis not present

## 2012-04-16 DIAGNOSIS — Z1151 Encounter for screening for human papillomavirus (HPV): Secondary | ICD-10-CM | POA: Insufficient documentation

## 2012-04-16 DIAGNOSIS — F17200 Nicotine dependence, unspecified, uncomplicated: Secondary | ICD-10-CM

## 2012-04-16 DIAGNOSIS — D869 Sarcoidosis, unspecified: Secondary | ICD-10-CM | POA: Diagnosis not present

## 2012-04-16 DIAGNOSIS — I10 Essential (primary) hypertension: Secondary | ICD-10-CM

## 2012-04-16 DIAGNOSIS — Z124 Encounter for screening for malignant neoplasm of cervix: Secondary | ICD-10-CM | POA: Diagnosis not present

## 2012-04-16 DIAGNOSIS — N898 Other specified noninflammatory disorders of vagina: Secondary | ICD-10-CM | POA: Diagnosis not present

## 2012-04-16 DIAGNOSIS — I509 Heart failure, unspecified: Secondary | ICD-10-CM | POA: Diagnosis not present

## 2012-04-16 LAB — GLUCOSE, CAPILLARY: Glucose-Capillary: 144 mg/dL — ABNORMAL HIGH (ref 70–99)

## 2012-04-16 LAB — POCT GLYCOSYLATED HEMOGLOBIN (HGB A1C): Hemoglobin A1C: 7

## 2012-04-16 MED ORDER — VARENICLINE TARTRATE 0.5 MG X 11 & 1 MG X 42 PO MISC: ORAL | Status: DC

## 2012-04-16 MED ORDER — METFORMIN HCL 1000 MG PO TABS: 1000.0000 mg | ORAL_TABLET | Freq: Two times a day (BID) | ORAL | Status: DC

## 2012-04-16 NOTE — Assessment & Plan Note (Addendum)
HbA1c 7.0 today, slightly higher from 12/2011 where it was 6.7.  Did not bring in her meter today, claims to be averaging 150s at home fasting.  CBG today 140s  Increased metformin to 1000mg  BID and continued Novolog 70/30 25 units in AM and 23 units in PM Continue to check cbgs at home and BRING IN METER NEXT VISIT Diet and exercise counseling given again  Goals (1 Years of Data) as of 04/16/2012          As of Today 04/04/12 03/07/12 02/12/12 01/19/12     Blood Pressure    . Blood Pressure < 140/90  139/81 120/80 152/92 124/80 148/94    . Blood Pressure < 140/90  139/81 120/80 152/92 124/80 148/94    . Blood Pressure < 140/90  139/81 120/80 152/92 124/80 148/94         Note created  04/16/2012  4:59 PM by Jerene Pitch, MD    Goals (1 Years of Data) as of 04/16/2012          As of Today 04/04/12 03/07/12 02/12/12 01/19/12     Blood Pressure    . Blood Pressure < 140/90  139/81 120/80 152/92 124/80 148/94    . Blood Pressure < 140/90  139/81 120/80 152/92 124/80 148/94     Lifestyle    . Quit smoking / using tobacco  No         Result Component    . HEMOGLOBIN A1C < 7.0  7.0    6.7    . LDL CALC < 100           Weight    . Weight < 200 lb (90.719 kg)  218 lb 1.6 oz (98.93 kg) 215 lb 6.4 oz (97.705 kg) 211 lb (95.709 kg) 213 lb 6.4 oz (96.798 kg) 218 lb 4.8 oz (99.02 kg)     BP in clinic today 139/81.  Compliant with all her medications  -continue Coreg 12.5mg  BID, lasix 40mg  qd, and Benicar 20mg  qd          Lifestyle    . Quit smoking / using tobacco  No         Result Component    . HEMOGLOBIN A1C < 7.0  7.0    6.7    . LDL CALC < 100           Weight    . Weight < 200 lb (90.719 kg)  218 lb 1.6 oz (98.93 kg) 215 lb 6.4 oz (97.705 kg) 211 lb (95.709 kg) 213 lb 6.4 oz (96.798 kg) 218 lb 4.8 oz (99.02 kg)

## 2012-04-16 NOTE — Patient Instructions (Addendum)
We talked about smoking cessation again today and we will start Chantix again, if you have any trouble with the [place] [id number]  Please continue to follow with Dr. Melvyn Novas and Dr. Stanford Breed  Please continue to lost weight...OUR WEIGHT LOSS GOAL FOR NEXT VISIT IS AT LEAST 3 POUNDS.     I will see you for follow up in 2-3 months  We will inform you of the pap results when they are back

## 2012-04-16 NOTE — Assessment & Plan Note (Addendum)
BP in clinic today 139/81.  Compliant with all her medications  -continue Coreg 12.5mg  BID (increaed by Dr. Stanford Breed), lasix 40mg  qd, and Benicar 20mg  qd -smoking cessation advised!  Goals (1 Years of Data) as of 04/16/2012          As of Today 04/04/12 03/07/12 02/12/12 01/19/12     Blood Pressure    . Blood Pressure < 140/90  139/81 120/80 152/92 124/80 148/94    . Blood Pressure < 140/90  139/81 120/80 152/92 124/80 148/94    . Blood Pressure < 140/90  139/81 120/80 152/92 124/80 148/94         Note created  04/16/2012  4:59 PM by Jerene Pitch, MD    Goals (1 Years of Data) as of 04/16/2012          As of Today 04/04/12 03/07/12 02/12/12 01/19/12     Blood Pressure    . Blood Pressure < 140/90  139/81 120/80 152/92 124/80 148/94    . Blood Pressure < 140/90  139/81 120/80 152/92 124/80 148/94     Lifestyle    . Quit smoking / using tobacco  No         Result Component    . HEMOGLOBIN A1C < 7.0  7.0    6.7    . LDL CALC < 100           Weight    . Weight < 200 lb (90.719 kg)  218 lb 1.6 oz (98.93 kg) 215 lb 6.4 oz (97.705 kg) 211 lb (95.709 kg) 213 lb 6.4 oz (96.798 kg) 218 lb 4.8 oz (99.02 kg)     BP in clinic today 139/81.  Compliant with all her medications  -continue Coreg 12.5mg  BID, lasix 40mg  qd, and Benicar 20mg  qd          Lifestyle    . Quit smoking / using tobacco  No         Result Component    . HEMOGLOBIN A1C < 7.0  7.0    6.7    . LDL CALC < 100           Weight    . Weight < 200 lb (90.719 kg)  218 lb 1.6 oz (98.93 kg) 215 lb 6.4 oz (97.705 kg) 211 lb (95.709 kg) 213 lb 6.4 oz (96.798 kg) 218 lb 4.8 oz (99.02 kg)

## 2012-04-16 NOTE — Assessment & Plan Note (Signed)
Continue to follow with Dr. Stanford Breed  -continue coreg, benicar, and lasix -smoking cessation!

## 2012-04-16 NOTE — Assessment & Plan Note (Signed)
Currently off prendnisone per Dr. Melvyn Novas and on [place] O2.  -Continue to follow up with Dr. Melvyn Novas -smoking cessation strongly advised, counseling given, will restart Chantix today--prescription given

## 2012-04-16 NOTE — Assessment & Plan Note (Addendum)
Weight has not changed this visit, 218lbs  -counseled again on diet and exercise and glucose control -goal to lose at least 3-5 pounds by next office visit  Goals (1 Years of Data) as of 04/16/2012          As of Today 04/04/12 03/07/12 02/12/12 01/19/12     Blood Pressure    . Blood Pressure < 140/90  139/81 120/80 152/92 124/80 148/94    . Blood Pressure < 140/90  139/81 120/80 152/92 124/80 148/94    . Blood Pressure < 140/90  139/81 120/80 152/92 124/80 148/94         Note created  04/16/2012  4:59 PM by Jerene Pitch, MD    Goals (1 Years of Data) as of 04/16/2012          As of Today 04/04/12 03/07/12 02/12/12 01/19/12     Blood Pressure    . Blood Pressure < 140/90  139/81 120/80 152/92 124/80 148/94    . Blood Pressure < 140/90  139/81 120/80 152/92 124/80 148/94     Lifestyle    . Quit smoking / using tobacco  No         Result Component    . HEMOGLOBIN A1C < 7.0  7.0    6.7    . LDL CALC < 100           Weight    . Weight < 200 lb (90.719 kg)  218 lb 1.6 oz (98.93 kg) 215 lb 6.4 oz (97.705 kg) 211 lb (95.709 kg) 213 lb 6.4 oz (96.798 kg) 218 lb 4.8 oz (99.02 kg)     BP in clinic today 139/81.  Compliant with all her medications  -continue Coreg 12.5mg  BID, lasix 40mg  qd, and Benicar 20mg  qd          Lifestyle    . Quit smoking / using tobacco  No         Result Component    . HEMOGLOBIN A1C < 7.0  7.0    6.7    . LDL CALC < 100           Weight    . Weight < 200 lb (90.719 kg)  218 lb 1.6 oz (98.93 kg) 215 lb 6.4 oz (97.705 kg) 211 lb (95.709 kg) 213 lb 6.4 oz (96.798 kg) 218 lb 4.8 oz (99.02 kg)

## 2012-04-16 NOTE — Assessment & Plan Note (Addendum)
Had another very long discussion and also reviewed Dr. Gustavus Bryant comments.  She would like to restart Chantix this visit and hopefully have success for smoking cessation.  I have discussed the side effects in detail and will continue to follow, monitor, and help her in her smoking cessation efforts.    -Chantix starting month pak prescribed  Ready to quit: Yes Counseling given: Yes

## 2012-04-17 ENCOUNTER — Other Ambulatory Visit: Payer: Self-pay | Admitting: Internal Medicine

## 2012-04-17 LAB — BASIC METABOLIC PANEL
BUN: 12 mg/dL (ref 6–23)
CO2: 35 mEq/L — ABNORMAL HIGH (ref 19–32)
Calcium: 9.3 mg/dL (ref 8.4–10.5)
Chloride: 95 mEq/L — ABNORMAL LOW (ref 96–112)
Creat: 0.77 mg/dL (ref 0.50–1.10)
Glucose, Bld: 109 mg/dL — ABNORMAL HIGH (ref 70–99)
Potassium: 3.9 mEq/L (ref 3.5–5.3)
Sodium: 139 mEq/L (ref 135–145)

## 2012-04-17 MED ORDER — METRONIDAZOLE 500 MG PO TABS: 500.0000 mg | ORAL_TABLET | Freq: Two times a day (BID) | ORAL | Status: DC

## 2012-04-17 NOTE — Progress Notes (Signed)
Subjective:   Patient ID: Melody Gray female   DOB: 07-31-1970 41 y.o.   MRN: [id number]  HPI: Melody Gray is a 41 y.o. obese African American female with extensive past medical history significant for sarcoidosis followed by Dr. Melvyn Novas, congestive heart failure followed by Dr. Stanford Breed with EF of approximately 35-45%, uncontrolled hypertension, chronic tobacco abuse, and diabetes presenting to the clinic today for routine followup visit and pap smear.  [name] continues to smoke cigarettes despite constant counseling and cessation advice given to her from several physicians.  I had another long discussion with her today and she would like to try Chantix today since she had success with that in the past.  She is now on continuous  O2 at home as well and continues to not follow a regular well controlled diabetic diet and very limited exercise.  She apparently lost 5 pounds since her last visit with me, but has gained it back due to unhealthy eating habits as per her.  She currently denies any headaches, N/V/D, chest pain, abdominal pain, or any urinary complaints at this time.    Past Medical History  Diagnosis Date  . Sarcoidosis     Followed by Dr. Melvyn Novas; w/ liver involvement per biopsy 12/09, Reversible airway component so started on [facility] 01/2010; HFA 75% p coaching 05/2010  . Hypoxemia     CT angiogram 9/11>> No PE; PFTs 10/11- FEV1 1.20 (49%) with 16% better p B2, DLCO 33%> corrects to 84; O2 sats ok on 4 lpm X rapid walk X 3 laps 05/2010  . Morbid obesity     Target wt= 153 for BMI <30  . QT prolongation   . Diabetes mellitus   . Hypertension   . Hx of cardiac cath 2/08    No CAD, no RAS,  normal EF  . Seborrheic dermatitis of scalp   . 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 Hep B serology neg. but Hep C ab +, HIV neg. 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 Cardiomyopathy, nonischemic     EF 45%  12/10; Echo 7/11 normal EF, PAS 48  . Diabetic retinopathy(362.0)     Right eye 2/11  . Health maintenance examination     Mammogram 05/2010 Negative; Last Pap smear 03/2008; Last DM eye exam 2/11> mild non-proliferative diabetic retinopathy. OD  . Helicobacter pylori ab+ 05/2011    Pt was symptomatic and treatment planned for 05/2011   Current Outpatient Prescriptions  Medication Sig Dispense Refill  . carvedilol (COREG) 12.5 MG tablet Take 1 tablet (12.5 mg total) by mouth 2 (two) times daily with a meal.  60 tablet  12  . furosemide (LASIX) 40 MG tablet Take 1 tablet (40 mg total) by mouth daily.  30 tablet  2  . insulin aspart protamine-insulin aspart (NOVOLOG 70/30) (70-30) 100 UNIT/ML injection Inject 23-25 Units into the skin 2 (two) times daily with a meal. Inject 25 units before breakfast and inject 23 units before dinner.  20 mL  5  . olmesartan (BENICAR) 40 MG tablet Take 0.5 tablets (20 mg total) by mouth daily.  30 tablet  11  . omeprazole (PRILOSEC) 20 MG capsule Take 20 mg by mouth 2 (two) times daily.       . potassium chloride SA (K-DUR,KLOR-CON) 20 MEQ tablet Take 20 mEq by mouth daily.      . metFORMIN (  GLUCOPHAGE) 1000 MG tablet Take 1 tablet (1,000 mg total) by mouth 2 (two) times daily with a meal.  60 tablet  3  . varenicline (CHANTIX STARTING MONTH PAK) 0.5 MG X 11 & 1 MG X 42 tablet Take one 0.5 mg tablet by mouth once daily for 3 days, then increase to one 0.5 mg tablet twice daily for 4 days, then increase to one 1 mg tablet twice daily.  53 tablet  0   Family History  Problem Relation Age of Onset  . Cancer Mother     colon cancer  . Multiple sclerosis Father   . Asthma Sister     in childhood  . Diabetes Father   . Hypertension     History   Social History  . Marital Status: Single    Spouse Name: N/A    Number of Children: 2  . Years of Education: N/A   Occupational History  . 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 Smoking  status: Current Every Day Smoker -- 0.5 packs/day for 20 years    Types: Cigarettes  . Smokeless tobacco: Never Used     Comment: wants to try Chanix again.  . Alcohol Use: No  . Drug Use: No  . Sexually Active: Yes    Birth Control/ Protection: None   Other Topics Concern  . None   Social History Narrative   Diabetic card given 05/03/2010.Financial assistance approved for 100% discount at [facility] and has [facility] card. 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   Review of Systems: Constitutional: Denies fever, chills, diaphoresis, appetite change and fatigue.  HEENT: Denies photophobia, eye pain, redness, hearing loss, ear pain, congestion, sore throat, rhinorrhea, sneezing, mouth sores, trouble swallowing, neck pain, neck stiffness and tinnitus.  Respiratory: SOB, DOE, occasional dry cough. History of sarcoidosis. Smoker. Denies chest tightness, and wheezing.  Cardiovascular: History of congestive heart failure. Denies chest pain, palpitations.  Gastrointestinal: Obese. Denies nausea, vomiting, abdominal pain, diarrhea, constipation, blood in stool and abdominal distention.  Genitourinary: Denies dysuria, urgency, frequency, hematuria, flank pain and difficulty urinating.  Musculoskeletal: Denies myalgias, back pain, joint swelling, arthralgias and gait problem.  Skin: Denies pallor, rash and wound.  Neurological: Denies dizziness, seizures, syncope, weakness, light-headedness, numbness and headaches.  Hematological: Denies adenopathy. Easy bruising, personal or family bleeding history  Psychiatric/Behavioral: Denies suicidal ideation, mood changes, confusion, nervousness, sleep disturbance and agitation  Objective:  Physical Exam: Filed Vitals:   04/16/12 1525  BP: 139/81  Pulse: 90  Temp: 98.5 F (36.9 C)  TempSrc: Oral  Height: 5\' 1"  (1.549 m)  Weight: 218 lb 1.6 oz (98.93 kg)  SpO2: 96%   Constitutional: Vital signs reviewed.   Patient is a obese female in no acute distress and cooperative with exam. Alert and oriented x3. On [place] 2L O2 Head: Normocephalic and atraumatic Ear: TM normal bilaterally Mouth: no erythema or exudates, MMM Eyes: PERRLA, EOMI, conjunctivae normal, No scleral icterus.  Neck: Supple, Trachea midline normal ROM, No JVD, mass, thyromegaly, or carotid bruit present.  Cardiovascular: RRR, S1 normal, S2 normal, no MRG, pulses symmetric and intact bilaterally Pulmonary/Chest: CTAB, no wheezes, rales, or rhonchi Abdominal: Soft. Non-tender, non-distended, bowel sounds are normal, no masses, organomegaly, or guarding present.  GU: no CVA tenderness.  Pap smear done: no external erythema, thick white discharge around cervix.   Musculoskeletal: No joint deformities, erythema, or stiffness, ROM full and  no nontender Hematology: no cervical, inginal, or axillary adenopathy.  Neurological: A&O x3, Strength is normal and symmetric bilaterally, cranial nerve II-XII are grossly intact, no focal motor deficit, sensory intact to light touch bilaterally.  Skin: Warm, dry and intact. No rash, cyanosis, or clubbing.  Psychiatric: Normal mood and affect. speech and behavior is normal. Judgment and thought content normal. Cognition and memory are normal.   Assessment & Plan:  Sarcoidosis: f/u Dr. Melvyn Novas CHF: f/u Dr. Stanford Breed Health Maintenance: pap smear done today DM: Hba1c went up to 7, increased metformin to 1000mg  BID Obesity: weight loss goal for next visit at least 3 pounds Smoking Cessation: Start Chantix

## 2012-04-17 NOTE — Progress Notes (Signed)
[place]: POSITIVE from most recent pap smear.    Prescribed 7 day course of Metronidazole 500mg  BID PO.    Spoke to Melody Gray on the phone this evening of the results and the medication.  She will pick up her prescription as soon as possible.

## 2012-04-22 ENCOUNTER — Encounter: Payer: Self-pay | Admitting: Internal Medicine

## 2012-05-06 ENCOUNTER — Telehealth: Payer: Self-pay | Admitting: Internal Medicine

## 2012-05-06 NOTE — Addendum Note (Signed)
Addended by: Wilber Oliphant on: 05/06/2012 08:36 PM   Modules accepted: Orders

## 2012-05-06 NOTE — Telephone Encounter (Signed)
Telephone Call Addendum:  I spoke to Ms. Hofacker this evening in regards to her most recent papsmear that was done in her last office visit.  I reviewed that cytology showed ASCUS but HPV was not detected.  Per guidelines, the recommendation is to repeat cotesting in 3 years, however, if she likes it may be good for her to regularly follow up with GYN as well, especially since she does not have regular follow up with a GYN.    Ms. Murri acknowledges understanding of her results and follow up.  She requests to follow up with a female GYN and in the meantime will also continue to follow up with me in clinic as well.    I have sent an order for GYN referral at this time.    On an unrelated matter, Ms. Hailu inquired about her chantix prescription claiming at the time it was given she could not afford it, however, she has enough money now and will purchase soon.  Since her last office visit, she has cut down her smoking to approximately 2-3 a day with meals.  She still plans to proceed with smoking cessation and will purchase Chantix.

## 2012-05-07 ENCOUNTER — Ambulatory Visit: Payer: Self-pay | Admitting: Cardiology

## 2012-05-08 ENCOUNTER — Encounter: Payer: Self-pay | Admitting: Obstetrics & Gynecology

## 2012-05-15 ENCOUNTER — Other Ambulatory Visit: Payer: Self-pay | Admitting: Internal Medicine

## 2012-05-15 MED ORDER — OMEPRAZOLE 20 MG PO CPDR: 20.0000 mg | DELAYED_RELEASE_CAPSULE | Freq: Two times a day (BID) | ORAL | Status: DC

## 2012-05-22 ENCOUNTER — Encounter: Payer: Self-pay | Admitting: Obstetrics & Gynecology

## 2012-06-04 ENCOUNTER — Ambulatory Visit: Payer: Self-pay | Admitting: Cardiology

## 2012-06-07 ENCOUNTER — Encounter: Payer: Self-pay | Admitting: Obstetrics & Gynecology

## 2012-06-07 NOTE — Addendum Note (Signed)
Addended by: Hulan Fray on: 06/07/2012 08:15 PM   Modules accepted: Orders

## 2012-06-11 ENCOUNTER — Other Ambulatory Visit: Payer: Self-pay | Admitting: *Deleted

## 2012-06-11 ENCOUNTER — Encounter: Payer: Self-pay | Admitting: Internal Medicine

## 2012-06-11 MED ORDER — FUROSEMIDE 40 MG PO TABS: 40.0000 mg | ORAL_TABLET | Freq: Every day | ORAL | Status: DC

## 2012-06-21 ENCOUNTER — Ambulatory Visit (INDEPENDENT_AMBULATORY_CARE_PROVIDER_SITE_OTHER): Payer: Medicare Other | Admitting: Cardiology

## 2012-06-21 ENCOUNTER — Encounter: Payer: Self-pay | Admitting: Cardiology

## 2012-06-21 VITALS — BP 142/82 | HR 93 | Ht 61.0 in | Wt 216.0 lb

## 2012-06-21 DIAGNOSIS — I429 Cardiomyopathy, unspecified: Secondary | ICD-10-CM

## 2012-06-21 DIAGNOSIS — F17200 Nicotine dependence, unspecified, uncomplicated: Secondary | ICD-10-CM

## 2012-06-21 DIAGNOSIS — I428 Other cardiomyopathies: Secondary | ICD-10-CM | POA: Diagnosis not present

## 2012-06-21 LAB — BASIC METABOLIC PANEL WITH GFR
BUN: 11 mg/dL (ref 6–23)
CO2: 32 mEq/L (ref 19–32)
Calcium: 9 mg/dL (ref 8.4–10.5)
Chloride: 95 mEq/L — ABNORMAL LOW (ref 96–112)
Creat: 0.71 mg/dL (ref 0.50–1.10)
GFR, Est African American: >89 mL/min
GFR, Est Non African American: >89 mL/min
Glucose, Bld: 150 mg/dL — ABNORMAL HIGH (ref 70–99)
Potassium: 3.4 mEq/L — ABNORMAL LOW (ref 3.5–5.3)
Sodium: 136 mEq/L (ref 135–145)

## 2012-06-21 MED ORDER — CARVEDILOL 25 MG PO TABS: 25.0000 mg | ORAL_TABLET | Freq: Two times a day (BID) | ORAL | Status: DC

## 2012-06-21 NOTE — Progress Notes (Signed)
HPI: Pleasant female I saw in July of 2012 for evaluation of abnormal echocardiogram. Echocardiogram in June of 2012 revealed an ejection fraction of 35% which was new. There was mild biatrial enlargement, mild right ventricular enlargement and mildly reduced RV function. Cardiac MRI in July of 2012 revealed moderate to severe LV systolic dysfunction, EF [id number]. The anterior and anterolateral walls appeared severely hypokinetic. Mild RV dilation with mild systolic dysfunction. Possible small areas of mid wall delayed enhancement in the apical septal and apical lateral walls. These areas were very faint and not definitely of clinical significance. Mid wall enhancement can be found in infiltrative cardiomyopathies such as that due to sarcoidosis. TSH and ferritin normal. Cardiac catheterization repeated in September of 2012 and showed an ejection fraction of 40%. There was no coronary disease. Echo repeated in November of 2013 and showed an ejection fraction of 45-50%, mild left atrial enlargement and grade 1 diastolic dysfunction. Since I last saw her in Nov of 2013, she has dyspnea on exertion but no orthopnea, PND, pedal edema, syncope or chest pain.   Current Outpatient Prescriptions  Medication Sig Dispense Refill  . carvedilol (COREG) 12.5 MG tablet Take 1 tablet (12.5 mg total) by mouth 2 (two) times daily with a meal.  60 tablet  12  . furosemide (LASIX) 40 MG tablet Take 1 tablet (40 mg total) by mouth daily.  30 tablet  2  . insulin aspart protamine-insulin aspart (NOVOLOG 70/30) (70-30) 100 UNIT/ML injection Inject 23-25 Units into the skin 2 (two) times daily with a meal. Inject 25 units before breakfast and inject 23 units before dinner.  20 mL  5  . metFORMIN (GLUCOPHAGE) 1000 MG tablet Take 1 tablet (1,000 mg total) by mouth 2 (two) times daily with a meal.  60 tablet  3  . omeprazole (PRILOSEC) 20 MG capsule Take 1 capsule (20 mg total) by mouth 2 (two) times daily.  60 capsule  3  .  potassium chloride SA (K-DUR,KLOR-CON) 20 MEQ tablet Take 20 mEq by mouth daily.      Marland Kitchen olmesartan (BENICAR) 40 MG tablet Take 0.5 tablets (20 mg total) by mouth daily.  30 tablet  11   No current facility-administered medications for this visit.     Past Medical History  Diagnosis Date  . Sarcoidosis     Followed by Dr. Melvyn Novas; w/ liver involvement per biopsy 12/09, Reversible airway component so started on [facility] 01/2010; HFA 75% p coaching 05/2010  . Hypoxemia     CT angiogram 9/11>> No PE; PFTs 10/11- FEV1 1.20 (49%) with 16% better p B2, DLCO 33%> corrects to 84; O2 sats ok on 4 lpm X rapid walk X 3 laps 05/2010  . Morbid obesity     Target wt= 153 for BMI <30  . QT prolongation   . Diabetes mellitus   . Hypertension   . Hx of cardiac cath 2/08    No CAD, no RAS,  normal EF  . Seborrheic dermatitis of scalp   . 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 Hep B serology neg. but Hep C ab +, HIV neg. 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 Cardiomyopathy, nonischemic     EF 45% 12/10; Echo 7/11 normal EF, PAS 48  . Diabetic retinopathy(362.0)     Right eye 2/11  . Health maintenance examination     Mammogram 05/2010 Negative;  Last Pap smear 03/2008; Last DM eye exam 2/11> mild non-proliferative diabetic retinopathy. OD  . Helicobacter pylori ab+ 05/2011    Pt was symptomatic and treatment planned for 05/2011    Past Surgical History  Procedure Laterality Date  . Tubal ligation    . Breast surgery    . Cesarean section      History   Social History  . Marital Status: Single    Spouse Name: N/A    Number of Children: 2  . Years of Education: N/A   Occupational History  . 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 Smoking status: Current Every Day Smoker -- 0.50 packs/day for 20 years    Types: Cigarettes  . Smokeless tobacco: Never Used     Comment: wants to try Chanix  again.  . Alcohol Use: No  . Drug Use: No  . Sexually Active: Yes    Birth Control/ Protection: None   Other Topics Concern  . Not on file   Social History Narrative   Diabetic card given 05/03/2010.   Financial assistance approved for 100% discount at [facility] and has [facility] card. [name] 12/07/2009.      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    ROS: no fevers or chills, productive cough, hemoptysis, dysphasia, odynophagia, melena, hematochezia, dysuria, hematuria, rash, seizure activity, orthopnea, PND, pedal edema, claudication. Remaining systems are negative.  Physical Exam: Well-developed obese in no acute distress.  Skin is warm and dry.  HEENT is normal.  Neck is supple.  Chest is clear to auscultation with normal expansion.  Cardiovascular exam is regular rate and rhythm.  Abdominal exam nontender or distended. No masses palpated. Extremities show no edema. neuro grossly intact  Electrocardiogram shows sinus rhythm at a rate of 93. Nonspecific ST changes.

## 2012-06-21 NOTE — Patient Instructions (Addendum)
Your physician wants you to follow-up in: [place] will receive a reminder letter in the mail two months in advance. If you don't receive a letter, please call our office to schedule the follow-up appointment.   INCREASE CARVEDILOL TO 25 MG TWICE DAILY  Your physician recommends that you HAVE LAB WORK TODAY

## 2012-06-21 NOTE — Assessment & Plan Note (Signed)
Blood pressure mildly elevated at home. Increase carvedilol to 25 mg twice daily.

## 2012-06-21 NOTE — Assessment & Plan Note (Signed)
Patient counseled on discontinuing. 

## 2012-06-21 NOTE — Assessment & Plan Note (Signed)
Continue beta blocker and ARB. LV function somewhat improved on most recent echocardiogram. Cardiomyopathy possibly related to hypertension.

## 2012-06-25 ENCOUNTER — Encounter: Payer: Self-pay | Admitting: Internal Medicine

## 2012-09-03 ENCOUNTER — Ambulatory Visit (INDEPENDENT_AMBULATORY_CARE_PROVIDER_SITE_OTHER): Payer: Medicare Other | Admitting: Internal Medicine

## 2012-09-03 ENCOUNTER — Encounter: Payer: Self-pay | Admitting: Internal Medicine

## 2012-09-03 VITALS — BP 138/85 | HR 91 | Temp 97.7°F | Ht 62.0 in | Wt 218.3 lb

## 2012-09-03 DIAGNOSIS — M79672 Pain in left foot: Secondary | ICD-10-CM

## 2012-09-03 DIAGNOSIS — N898 Other specified noninflammatory disorders of vagina: Secondary | ICD-10-CM | POA: Insufficient documentation

## 2012-09-03 DIAGNOSIS — I509 Heart failure, unspecified: Secondary | ICD-10-CM

## 2012-09-03 DIAGNOSIS — R6889 Other general symptoms and signs: Secondary | ICD-10-CM | POA: Diagnosis not present

## 2012-09-03 DIAGNOSIS — L293 Anogenital pruritus, unspecified: Secondary | ICD-10-CM | POA: Diagnosis not present

## 2012-09-03 DIAGNOSIS — D649 Anemia, unspecified: Secondary | ICD-10-CM

## 2012-09-03 DIAGNOSIS — IMO0001 Reserved for inherently not codable concepts without codable children: Secondary | ICD-10-CM | POA: Insufficient documentation

## 2012-09-03 DIAGNOSIS — I10 Essential (primary) hypertension: Secondary | ICD-10-CM | POA: Diagnosis not present

## 2012-09-03 DIAGNOSIS — M79609 Pain in unspecified limb: Secondary | ICD-10-CM | POA: Diagnosis not present

## 2012-09-03 DIAGNOSIS — K219 Gastro-esophageal reflux disease without esophagitis: Secondary | ICD-10-CM

## 2012-09-03 DIAGNOSIS — F17200 Nicotine dependence, unspecified, uncomplicated: Secondary | ICD-10-CM

## 2012-09-03 LAB — CBC WITH DIFFERENTIAL/PLATELET
Basophils Absolute: 0 10*3/uL (ref 0.0–0.1)
Basophils Relative: 1 % (ref 0–1)
Eosinophils Absolute: 0.6 10*3/uL (ref 0.0–0.7)
Eosinophils Relative: 10 % — ABNORMAL HIGH (ref 0–5)
HCT: 28.1 % — ABNORMAL LOW (ref 36.0–46.0)
Hemoglobin: 8.9 g/dL — ABNORMAL LOW (ref 12.0–15.0)
Lymphocytes Relative: 37 % (ref 12–46)
Lymphs Abs: 2 10*3/uL (ref 0.7–4.0)
MCH: 24.4 pg — ABNORMAL LOW (ref 26.0–34.0)
MCHC: 31.7 g/dL (ref 30.0–36.0)
MCV: 77 fL — ABNORMAL LOW (ref 78.0–100.0)
Monocytes Absolute: 0.6 10*3/uL (ref 0.1–1.0)
Monocytes Relative: 10 % (ref 3–12)
Neutro Abs: 2.4 10*3/uL (ref 1.7–7.7)
Neutrophils Relative %: 42 % — ABNORMAL LOW (ref 43–77)
Platelets: 246 10*3/uL (ref 150–400)
RBC: 3.65 MIL/uL — ABNORMAL LOW (ref 3.87–5.11)
RDW: 18.9 % — ABNORMAL HIGH (ref 11.5–15.5)
WBC: 5.6 10*3/uL (ref 4.0–10.5)

## 2012-09-03 LAB — LIPID PANEL
Cholesterol: 194 mg/dL (ref 0–200)
HDL: 40 mg/dL (ref >39)
LDL Cholesterol: 91 mg/dL (ref 0–99)
Total CHOL/HDL Ratio: 4.9 Ratio
Triglycerides: 313 mg/dL — ABNORMAL HIGH (ref <150)
VLDL: 63 mg/dL — ABNORMAL HIGH (ref 0–40)

## 2012-09-03 LAB — BASIC METABOLIC PANEL WITH GFR
BUN: 15 mg/dL (ref 6–23)
CO2: 29 meq/L (ref 19–32)
Calcium: 9.2 mg/dL (ref 8.4–10.5)
Chloride: 101 meq/L (ref 96–112)
Creat: 0.89 mg/dL (ref 0.50–1.10)
Glucose, Bld: 154 mg/dL — ABNORMAL HIGH (ref 70–99)
Potassium: 3.7 meq/L (ref 3.5–5.3)
Sodium: 142 meq/L (ref 135–145)

## 2012-09-03 LAB — FERRITIN: Ferritin: 6 ng/mL — ABNORMAL LOW (ref 10–291)

## 2012-09-03 LAB — POCT GLYCOSYLATED HEMOGLOBIN (HGB A1C): Hemoglobin A1C: 6.3

## 2012-09-03 LAB — GLUCOSE, CAPILLARY: Glucose-Capillary: 162 mg/dL — ABNORMAL HIGH (ref 70–99)

## 2012-09-03 LAB — IRON AND TIBC
%SAT: 10 % — ABNORMAL LOW (ref 20–55)
Iron: 45 ug/dL (ref 42–145)
TIBC: 464 ug/dL (ref 250–470)
UIBC: 419 ug/dL — ABNORMAL HIGH (ref 125–400)

## 2012-09-03 MED ORDER — VARENICLINE TARTRATE 0.5 MG X 11 & 1 MG X 42 PO MISC: ORAL | Status: DC

## 2012-09-03 MED ORDER — OMEPRAZOLE 40 MG PO CPDR: 40.0000 mg | DELAYED_RELEASE_CAPSULE | Freq: Every day | ORAL | Status: DC

## 2012-09-03 MED ORDER — BENZOCAINE-RESORCINOL 5-2 % VA CREA: TOPICAL_CREAM | Freq: Every day | VAGINAL | Status: DC

## 2012-09-03 NOTE — Assessment & Plan Note (Signed)
External surface only.  Relief with vagisil in past.  -vagisil prn

## 2012-09-03 NOTE — Patient Instructions (Signed)
General Instructions:  Please bring your meter to your next visit and monitor for low blood sugars, call the clinic if any concerns [phone]  Please get your chantix prescription filled today, I have handed you the prescription during our visit today and stop smoking!  Continue to follow up with Dr. Melvyn Novas and Stanford Breed  Follow up with the silver sneakers program to get physical exercise into your daily routine  We will get a left foot xray today and call you with the result  Rescheduled your GYN appointment that we referred you to last time  Your weight was 218lbs today, lets try to drop 3 pounds on next visit    Treatment Goals:  Goals (1 Years of Data) as of 09/03/12         As of Today 06/21/12 04/16/12 04/04/12 03/07/12     Blood Pressure    . Blood Pressure < 140/90  138/85 142/82 139/81 120/80 152/92    . Blood Pressure < 140/90  138/85 142/82 139/81 120/80 152/92    . Blood Pressure < 140/90  138/85 142/82 139/81 120/80 152/92       Note created  04/16/2012  4:59 PM by Jerene Pitch, MD   Goals (1 Years of Data) as of 04/16/2012          As of Today 04/04/12 03/07/12 02/12/12 01/19/12     Blood Pressure    . Blood Pressure < 140/90  139/81 120/80 152/92 124/80 148/94    . Blood Pressure < 140/90  139/81 120/80 152/92 124/80 148/94     Lifestyle    . Quit smoking / using tobacco  No         Result Component    . HEMOGLOBIN A1C < 7.0  7.0    6.7    . LDL CALC < 100           Weight    . Weight < 200 lb (90.719 kg)  218 lb 1.6 oz (98.93 kg) 215 lb 6.4 oz (97.705 kg) 211 lb (95.709 kg) 213 lb 6.4 oz (96.798 kg) 218 lb 4.8 oz (99.02 kg)     BP in clinic today 139/81.  Compliant with all her medications  -continue Coreg 12.5mg  BID, lasix 40mg  qd, and Benicar 20mg  qd            Lifestyle    . Quit smoking / using tobacco    No       Result Component    . HEMOGLOBIN A1C < 7.0  6.3  7.0      . LDL CALC < 100           Weight    . Weight < 200 lb (90.719 kg)  218 lb  4.8 oz (99.02 kg) 216 lb (97.977 kg) 218 lb 1.6 oz (98.93 kg) 215 lb 6.4 oz (97.705 kg) 211 lb (95.709 kg)     Progress Toward Treatment Goals:  Treatment Goal 09/03/2012  Hemoglobin A1C at goal  Blood pressure at goal  Stop smoking smoking less   Self Care Goals & Plans:  Self Care Goal 09/03/2012  Manage my medications take my medicines as prescribed  Monitor my health keep track of my blood glucose  Eat healthy foods -  Be physically active find an activity I enjoy  Stop smoking call [facility] (1-800-QUIT-NOW); cut down the number of cigarettes smoked    Home Blood Glucose Monitoring 09/03/2012  Check my blood sugar once a day  When to  check my blood sugar -    Care Management & Community Referrals:  Referral 09/03/2012  Referrals made to community resources exercise/physical therapy    Varenicline oral tablets What is this medicine? VARENICLINE (var EN i kleen) is used to help people quit smoking. It can reduce the symptoms caused by stopping smoking. It is used with a patient support program recommended by your physician. This medicine may be used for other purposes; ask your health care provider or pharmacist if you have questions. What should I tell my health care provider before I take this medicine? They need to know if you have any of these conditions: -bipolar disorder, depression, schizophrenia or other mental illness -heart disease -kidney disease -peripheral vascular disease -stroke -suicidal thoughts, plans, or attempt; a previous suicide attempt by you or a family member -an unusual or allergic reaction to varenicline, other medicines, foods, dyes, or preservatives -pregnant or trying to get pregnant -breast-feeding How should I use this medicine? You should set a date to stop smoking and tell your doctor. Start this medicine one week before the quit date. You can also start taking this medicine before you choose a quit date, and then pick a quit date that is  between 8 and 35 days of treatment with this medicine. Stick to your plan; ask about support groups or other ways to help you remain a 'quitter'. Take this medicine by mouth after eating. Take with a full glass of water. Follow the directions on the prescription label. Take your doses at regular intervals. Do not take your medicine more often than directed. A special MedGuide will be given to you by the pharmacist with each prescription and refill. Be sure to read this information carefully each time. Talk to your pediatrician regarding the use of this medicine in children. This medicine is not approved for use in children. Overdosage: If you think you have taken too much of this medicine contact a poison control center or emergency room at once. NOTE: This medicine is only for you. Do not share this medicine with others. What if I miss a dose? If you miss a dose, take it as soon as you can. If it is almost time for your next dose, take only that dose. Do not take double or extra doses. What may interact with this medicine? -insulin -other stop smoking aids -theophylline -warfarin This list may not describe all possible interactions. Give your health care provider a list of all the medicines, herbs, non-prescription drugs, or dietary supplements you use. Also tell them if you smoke, drink alcohol, or use illegal drugs. Some items may interact with your medicine. What should I watch for while using this medicine? Visit your doctor or health care professional for regular check ups. Ask for ongoing advice and encouragement from your doctor or healthcare professional, friends, and family to help you quit. If you smoke while on this medication, quit again Your mouth may get dry. Chewing sugarless gum or sucking hard candy, and drinking plenty of water may help. Contact your doctor if the problem does not go away or is severe. You may get drowsy or dizzy. Do not drive, use machinery, or do anything that  needs mental alertness until you know how this medicine affects you. Do not stand or sit up quickly, especially if you are an older patient. This reduces the risk of dizzy or fainting spells. The use of this medicine may increase the chance of suicidal thoughts or actions. Pay special attention to  how you are responding while on this medicine. Any worsening of mood, or thoughts of suicide or dying should be reported to your health care professional right away. What side effects may I notice from receiving this medicine? Side effects that you should report to your doctor or health care professional as soon as possible: -allergic reactions like skin rash, itching or hives, swelling of the face, lips, tongue, or throat -breathing problems -changes in vision -chest pain or chest tightness -confusion, trouble speaking or understanding -fast, irregular heartbeat -feeling faint or lightheaded, falls -fever -pain in legs when walking -problems with balance, talking, walking -ringing in ears -sudden numbness or weakness of the face, arm or leg -suicidal thoughts or other mood changes -trouble passing urine or change in the amount of urine -unusual bleeding or bruising -unusually weak or tired Side effects that usually do not require medical attention (report to your doctor or health care professional if they continue or are bothersome): -constipation -headache -nausea, vomiting -strange dreams -stomach gas -trouble sleeping This list may not describe all possible side effects. Call your doctor for medical advice about side effects. You may report side effects to FDA at 1-800-FDA-1088. Where should I keep my medicine? Keep out of the reach of children. Store at room temperature between 15 and 30 degrees C (59 and 86 degrees F). Throw away any unused medicine after the expiration date. NOTE: This sheet is a summary. It may not cover all possible information. If you have questions about this  medicine, talk to your doctor, pharmacist, or health care provider.  2013, [employer]. (11/22/2009 3:12:38 PM)

## 2012-09-03 NOTE — Assessment & Plan Note (Signed)
BP Readings from Last 3 Encounters:  09/03/12 138/85  06/21/12 142/82  04/16/12 139/81   Lab Results  Component Value Date   NA 136 06/21/2012   K 3.4* 06/21/2012   CREATININE 0.71 06/21/2012    Assessment: Blood pressure control: controlled Progress toward BP goal:  at goal  Plan: Medications:  continue current medications on coreg 25mg  bid and benicar 20mg  qd and lasix 40mg  qd Educational resources provided: brochure Self management tools provided:   Other plans: smoking cessation and continue to follow up with Dr. Stanford Breed

## 2012-09-03 NOTE — Assessment & Plan Note (Addendum)
Lab Results  Component Value Date   HGBA1C 6.3 09/03/2012   HGBA1C 7.0 04/16/2012   HGBA1C 6.7 01/19/2012    Assessment: Diabetes control: good control (HgbA1C at goal) Progress toward A1C goal:  at goal Comments: improved  Plan: Medications:  continue current medications: on metformin 1000mg  bid and 70/30 25 units BID with meals.  Claims to have been more compliant with medications lately and watching what she is eating.  Home glucose monitoring: Frequency: once a day  Timing:   Instruction/counseling given: reminded to bring blood glucose meter & log to each visit, discussed the need for weight loss and discussed diet Educational resources provided: brochure Self management tools provided:   Other plans: discussed hypoglycemia, pt denies.  Requested her to check regular for 1 week and call clinic with results.

## 2012-09-03 NOTE — Assessment & Plan Note (Addendum)
On BB and ARB and Lasix.    -Continue to follow with Dr. Stanford Breed -will follow up about cardiopulmonary rehab

## 2012-09-03 NOTE — Assessment & Plan Note (Signed)
Intermittent, several months, localized to dorsal aspect of foot with occasional radiation to left heel.  Worse with movement and certain motions.  On physical exam, pain on eversion of foot. Denies any trauma to foot. Hx of multiple ankle sprains as a child  ?old stress fracture -L foot xray -reports improvement with tylenol prn -supportive footwear

## 2012-09-03 NOTE — Progress Notes (Signed)
Subjective:   Patient ID: Melody Gray female   DOB: Apr 26, 1971 42 y.o.   MRN: [id number]  HPI: Melody Gray is a 42 y.o. obese African American female with past medical history significant for sarcoidosis followed by Dr. Melvyn Novas, congestive heart failure followed by Dr. Stanford Breed with EF 45% per echo 03/2012, uncontrolled hypertension, chronic tobacco dependence, and diabetes presenting to the clinic today for routine follow up visit.  Since our last visit, she continues to smoke but is buying only single cigarettes now and smokes approximately 6 a day.  She never got her chantix prescription filled from last visit.  Her wight is unchanged at 218lbs today and she is interested in joining a gym if its provided per medicare, we have discussed the silver sneakers program today and she will look further into it and increase exercise as tolerated.    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  She also has stresses at home with her boyfriend but she says overall things are okay she just wishes she could work but she knows her health does not permit it and she is on disability.  We have discussed trying to incorporate physical exercise into her regimen to try to give her something else to do and she claims she will try it.  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  She technically is only supposed to use it at nights but runs it for most of the day.  She is easily winded even with short amounts of exercise but her EF has improved.  She is compliant with her medications and follows with Dr. Melvyn Novas and Stanford Breed.  Finally, she endorses several months of intermittent left foot pain localized to the dorsal aspect.  The pain is not severe but it bothersome and causes discomfort while walking and with certain movements to her foot.  It  occasionally radiates to her heel and she takes tylenol for relief prn.  She has sprained her ankles multiple times as a child but denies any recent trauma.    Past Medical History  Diagnosis Date  . Sarcoidosis     Followed by Dr. Melvyn Novas; w/ liver involvement per biopsy 12/09, Reversible airway component so started on [facility] 01/2010; HFA 75% p coaching 05/2010  . Hypoxemia     CT angiogram 9/11>> No PE; PFTs 10/11- FEV1 1.20 (49%) with 16% better p B2, DLCO 33%> corrects to 84; O2 sats ok on 4 lpm X rapid walk X 3 laps 05/2010  . Morbid obesity     Target wt= 153 for BMI <30  . QT prolongation   . Diabetes mellitus   . Hypertension   . Hx of cardiac cath 2/08    No CAD, no RAS,  normal EF  . Seborrheic dermatitis of scalp   . 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 Hep B serology neg. but Hep C ab +, HIV neg. 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 Cardiomyopathy, nonischemic     EF 45% 12/10; Echo 7/11 normal EF, PAS 48  . Diabetic retinopathy(362.0)     Right eye 2/11  . Health maintenance examination     Mammogram 05/2010 Negative; Last Pap smear 03/2008;  Last DM eye exam 2/11> mild non-proliferative diabetic retinopathy. OD  . Helicobacter pylori ab+ 05/2011    Pt was symptomatic and treatment planned for 05/2011   Current Outpatient Prescriptions  Medication Sig Dispense Refill  . carvedilol (COREG) 25 MG tablet Take 1 tablet (25 mg total) by mouth 2 (two) times daily with a meal.  60 tablet  12  . furosemide (LASIX) 40 MG tablet Take 1 tablet (40 mg total) by mouth daily.  30 tablet  2  . insulin aspart protamine-insulin aspart (NOVOLOG 70/30) (70-30) 100 UNIT/ML injection Inject 23-25 Units into the skin 2 (two) times daily with a meal. Inject 25 units before breakfast and inject 23 units before dinner.  20 mL  5  . metFORMIN (GLUCOPHAGE) 1000 MG tablet Take 1 tablet (1,000 mg total) by mouth 2 (two)  times daily with a meal.  60 tablet  3  . olmesartan (BENICAR) 40 MG tablet Take 0.5 tablets (20 mg total) by mouth daily.  30 tablet  11  . omeprazole (PRILOSEC) 20 MG capsule Take 1 capsule (20 mg total) by mouth 2 (two) times daily.  60 capsule  3  . potassium chloride SA (K-DUR,KLOR-CON) 20 MEQ tablet Take 20 mEq by mouth daily.       No current facility-administered medications for this visit.   Family History  Problem Relation Age of Onset  . Cancer Mother     colon cancer  . Multiple sclerosis Father   . Asthma Sister     in childhood  . Diabetes Father   . Hypertension     History   Social History  . Marital Status: Single    Spouse Name: N/A    Number of Children: 2  . Years of Education: N/A   Occupational History  . 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 Smoking status: Current Every Day Smoker -- 0.50 packs/day for 20 years    Types: Cigarettes  . Smokeless tobacco: Never Used     Comment: wants to try Chanix again.  . Alcohol Use: No  . Drug Use: No  . Sexually Active: Yes    Birth Control/ Protection: None   Other Topics Concern  . Not on file   Social History Narrative   Diabetic card given 05/03/2010.   Financial assistance approved for 100% discount at [facility] and has [facility] card. [name] 12/07/2009.      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   Review of Systems: Constitutional: Fatigue.  Denies fever, chills, diaphoresis, appetite change.  HEENT: Denies photophobia, eye pain, redness, hearing loss, ear pain, congestion, sore throat, rhinorrhea, sneezing, mouth sores, trouble swallowing, neck pain, neck stiffness and tinnitus.   Respiratory: SOB, DOE.  Denies cough, chest tightness,  and wheezing.   Cardiovascular: Denies chest pain, palpitations and leg swelling.  Gastrointestinal: Denies nausea, vomiting, abdominal pain, diarrhea, constipation, blood in stool and abdominal distention.   Genitourinary: Vaginal itching.  Denies dysuria, urgency, frequency, hematuria, flank pain and difficulty urinating.  Endocrine: Denies: hot or cold intolerance, sweats, changes in hair or nails, polyuria, polydipsia. Musculoskeletal: Left foot pain.  Denies myalgias, back pain, joint swelling, arthralgias.   Skin: Denies pallor, rash and wound.  Neurological: Denies dizziness, seizures, syncope, weakness, light-headedness, numbness and headaches.  Hematological: Denies adenopathy. Easy bruising, personal or family bleeding history  Psychiatric/Behavioral: Denies suicidal ideation, mood changes, confusion, nervousness, sleep disturbance and agitation  Objective:  Physical Exam: Filed Vitals:   09/03/12 1334  BP: 138/85  Pulse: 91  Temp: 97.7 F (36.5 C)  TempSrc: Oral  Height: 5\' 2"  (1.575 m)  Weight: 218 lb 4.8 oz (99.02 kg)  SpO2: 95%   General: AAOX3, NAD Head: Normocephalic and atraumatic.  Eyes: Vision grossly intact, PERRLA, EOMI  Mouth: Pharynx pink and moist, no erythema, and no exudates.  Neck: Supple, full ROM Lungs: Normal respiratory effort, CTA B/L, no accessory muscle use, no crackles, and no wheezes. Heart: Normal rate, regular rhythm, no murmur, no gallop, and no rub.  Abdomen: Soft, obese, non-tender, normal bowel sounds, non-distended Msk: No joint swelling, no joint warmth, and no redness over joints. Pain on eversion of left foot or dorsal region.  Non tender to palpation.   Pulses: 2+ DP/PT pulses bilaterally Extremities: No cyanosis, clubbing, edema Neurologic: Alert & oriented X3, cranial nerves II-XII intact, strength normal in all extremities, sensation intact to light touch, and gait normal.  Skin: Turgor normal and no rashes.  Psych: Oriented X3, memory intact for recent and remote, normally interactive, good eye contact, not anxious appearing, and not depressed appearing. Assessment & Plan:  Discussed with Dr. Ellwood Dense  L foot pain: L foot xray,  ?stress fracture DM2: needs to bring meter, HbA1C well controlled, lipid panel today. Silver sneakers Smoking: gave chantix prescription again Hypokalemia: on K supplement at home, recheck bmet  Fatigue: hx of anemia, recheck cbc

## 2012-09-03 NOTE — Assessment & Plan Note (Signed)
Weight stable at 218lbs.  Discussed need to continue to lose weight.  -referred to silver sneakers program today -continue to work on diet and portion control and exercise as tolerated

## 2012-09-04 ENCOUNTER — Telehealth (HOSPITAL_COMMUNITY): Payer: Self-pay | Admitting: *Deleted

## 2012-09-04 NOTE — Telephone Encounter (Signed)
Melody Gray was contacted by telephone on 09/03/12 regarding recent referral to Pulmonary Rehab.  She has been referred previously in December 13.  Patient was contacted at that time and financial assist forms were mailed to patient.  Yesterday when I spoke with her she stated she had not received forms. They have been mailed again with instructions on how to fill out.  We will wait to hear if she is approved. Leverne Humbles RN

## 2012-09-04 NOTE — Progress Notes (Signed)
I discussed this case with Dr. Eula Fried soon after she saw the patient. I have read her documentation and I agree with her plan of care. Please see the resident note for details of management.

## 2012-09-16 ENCOUNTER — Other Ambulatory Visit: Payer: Self-pay | Admitting: *Deleted

## 2012-09-16 ENCOUNTER — Encounter: Payer: Self-pay | Admitting: Internal Medicine

## 2012-09-16 DIAGNOSIS — N898 Other specified noninflammatory disorders of vagina: Secondary | ICD-10-CM

## 2012-09-16 DIAGNOSIS — IMO0001 Reserved for inherently not codable concepts without codable children: Secondary | ICD-10-CM

## 2012-09-16 MED ORDER — BENZOCAINE-RESORCINOL 5-2 % VA CREA: TOPICAL_CREAM | Freq: Every day | VAGINAL | Status: DC

## 2012-09-16 MED ORDER — METFORMIN HCL 1000 MG PO TABS: 1000.0000 mg | ORAL_TABLET | Freq: Two times a day (BID) | ORAL | Status: DC

## 2012-09-20 ENCOUNTER — Ambulatory Visit (HOSPITAL_COMMUNITY)
Admission: RE | Admit: 2012-09-20 | Discharge: 2012-09-20 | Disposition: A | Discharge status: Home / Self Care | Payer: Medicare Other | Source: Ambulatory Visit | Attending: Internal Medicine | Admitting: Internal Medicine

## 2012-09-20 DIAGNOSIS — M79609 Pain in unspecified limb: Secondary | ICD-10-CM | POA: Insufficient documentation

## 2012-09-20 DIAGNOSIS — M79672 Pain in left foot: Secondary | ICD-10-CM

## 2012-09-26 ENCOUNTER — Encounter: Payer: Self-pay | Admitting: Internal Medicine

## 2012-09-27 ENCOUNTER — Telehealth: Payer: Self-pay | Admitting: Internal Medicine

## 2012-09-27 ENCOUNTER — Other Ambulatory Visit: Payer: Self-pay | Admitting: Internal Medicine

## 2012-09-27 DIAGNOSIS — I10 Essential (primary) hypertension: Secondary | ICD-10-CM

## 2012-09-27 DIAGNOSIS — I509 Heart failure, unspecified: Secondary | ICD-10-CM

## 2012-09-27 MED ORDER — FUROSEMIDE 40 MG PO TABS: 40.0000 mg | ORAL_TABLET | Freq: Every day | ORAL | Status: DC

## 2012-09-27 NOTE — Telephone Encounter (Signed)
I received an epic message from Ms. Mcgilvery asking me to call her in regards to an important matter. I called her this morning and she wanted to let me know that she has started her chantix and that she has cut down to 1 cigarette per day since our last office visit.  I congratulated her on this effort and encouraged her to continue on her plans for complete smoking cessation.    She also called to ask about Benicar side-effects.  She claims she saw a commercial on TV about Benicar and just wanted to make sure she is okay to continue based on the side-effects she heard on TV.  She reports no problems since she has been on the Benicar and she claims she was transitioned to Benicar by Dr. Melvyn Novas.  We reviewed the side effect profile of this medication on the phone today per up to date including 1-10% significant adverse reactions including dizziness, headaches, hyperglycemia, diarrhea, flu-like syndrome, and respiratory issues including bronchitis, pharyngitis, rhinitis, and sinusitis.  We also reviewed <1% adverse reactions including: ARF, angioedema, rhabdomyolysis, and sprue-like symptoms including others.   I asked her if she has been experiencing any side-effects, and she said not really, just diarrhea occasionally, but she attributed that to her metformin.  Otherwise, she claims her BP has been well controlled since being on Benicar.  She was planning to call Dr. Gustavus Bryant office for follow up, and I encouraged her to discuss with both Dr. Melvyn Novas and Dr. Stanford Breed in regards to her concerns about Benicar if she still has them.  She did wish to discuss with them as well.  I will also forward this note to both providers who have graciously been working closely with Ms. Coulson over the years.    Finally, she asked for a refill of her Lasix since she has only 1 pill left, which I refilled today.  She is not sure of her next appointment with cardiology and will inquire with their office.    I am very pleased to hear she  has finally cut down on her smoking and hope she continues to do so and I look forward to seeing her in clinic in the future. She is encouraged to call back anytime or come to clinic for any further questions or concerns.

## 2012-11-12 ENCOUNTER — Telehealth: Payer: Self-pay | Admitting: Internal Medicine

## 2012-11-12 DIAGNOSIS — IMO0001 Reserved for inherently not codable concepts without codable children: Secondary | ICD-10-CM

## 2012-11-12 MED ORDER — INSULIN ASPART PROT & ASPART (70-30 MIX) 100 UNIT/ML ~~LOC~~ SUSP: SUBCUTANEOUS | Status: DC

## 2012-11-12 NOTE — Telephone Encounter (Signed)
I called and spoke to Ms. Genis this morning to clarify some paperwork that had arrived in our office for her for her diabetic supplies and ankle stabilizer.    She informed me she has requested a new meter that will be easier for her to check her blood sugars and less painful.  I discussed that she should be checking her sugars three times a day before her meals and she says she is getting better at doing so.    Also, she is recommended to try an over the counter ankle brace or wrap for her left foot pain first prior to having to need a stabilizer as that has not been prescribed and she would need to get re-evaluated.  If her pain continues, may consider sending to sports medicine for further evaluation.    Finally, she needs a refill of her insulin that has no more refills at this time.  Otherwise, she reports doing well.

## 2013-01-08 ENCOUNTER — Other Ambulatory Visit: Payer: Self-pay | Admitting: Internal Medicine

## 2013-01-15 NOTE — Progress Notes (Signed)
This encounter was created in error - please disregard.

## 2013-01-28 ENCOUNTER — Encounter: Payer: Self-pay | Admitting: Internal Medicine

## 2013-01-31 ENCOUNTER — Other Ambulatory Visit: Payer: Self-pay | Admitting: Internal Medicine

## 2013-02-06 ENCOUNTER — Ambulatory Visit (HOSPITAL_COMMUNITY)
Admit: 2013-02-06 | Discharge: 2013-02-06 | Disposition: A | Discharge status: Home / Self Care | Payer: Medicare Other | Attending: Family Medicine | Admitting: Family Medicine

## 2013-02-06 ENCOUNTER — Encounter (HOSPITAL_COMMUNITY): Payer: Self-pay | Admitting: Emergency Medicine

## 2013-02-06 ENCOUNTER — Other Ambulatory Visit (HOSPITAL_COMMUNITY): Payer: Self-pay | Admitting: Emergency Medicine

## 2013-02-06 ENCOUNTER — Emergency Department (INDEPENDENT_AMBULATORY_CARE_PROVIDER_SITE_OTHER)
Admission: EM | Admit: 2013-02-06 | Discharge: 2013-02-06 | Disposition: A | Discharge status: Home / Self Care | Payer: Medicare Other | Source: Home / Self Care | Attending: Family Medicine | Admitting: Family Medicine

## 2013-02-06 DIAGNOSIS — M79671 Pain in right foot: Secondary | ICD-10-CM

## 2013-02-06 DIAGNOSIS — M25572 Pain in left ankle and joints of left foot: Secondary | ICD-10-CM

## 2013-02-06 DIAGNOSIS — M7989 Other specified soft tissue disorders: Secondary | ICD-10-CM | POA: Diagnosis not present

## 2013-02-06 DIAGNOSIS — M79609 Pain in unspecified limb: Secondary | ICD-10-CM | POA: Insufficient documentation

## 2013-02-06 LAB — CBC WITH DIFFERENTIAL/PLATELET
Basophils Absolute: 0 10*3/uL (ref 0.0–0.1)
Basophils Relative: 1 % (ref 0–1)
Eosinophils Absolute: 0.4 10*3/uL (ref 0.0–0.7)
Eosinophils Relative: 6 % — ABNORMAL HIGH (ref 0–5)
HCT: 30.2 % — ABNORMAL LOW (ref 36.0–46.0)
Hemoglobin: 9.4 g/dL — ABNORMAL LOW (ref 12.0–15.0)
Lymphocytes Relative: 34 % (ref 12–46)
Lymphs Abs: 2.3 10*3/uL (ref 0.7–4.0)
MCH: 26 pg (ref 26.0–34.0)
MCHC: 31.1 g/dL (ref 30.0–36.0)
MCV: 83.4 fL (ref 78.0–100.0)
Monocytes Absolute: 0.6 10*3/uL (ref 0.1–1.0)
Monocytes Relative: 9 % (ref 3–12)
Neutro Abs: 3.4 10*3/uL (ref 1.7–7.7)
Neutrophils Relative %: 51 % (ref 43–77)
Platelets: 209 10*3/uL (ref 150–400)
RBC: 3.62 MIL/uL — ABNORMAL LOW (ref 3.87–5.11)
RDW: 15.7 % — ABNORMAL HIGH (ref 11.5–15.5)
WBC: 6.8 10*3/uL (ref 4.0–10.5)

## 2013-02-06 LAB — COMPREHENSIVE METABOLIC PANEL
ALT: 12 U/L (ref 0–35)
AST: 16 U/L (ref 0–37)
Albumin: 3.3 g/dL — ABNORMAL LOW (ref 3.5–5.2)
Alkaline Phosphatase: 82 U/L (ref 39–117)
BUN: 13 mg/dL (ref 6–23)
CO2: 34 mEq/L — ABNORMAL HIGH (ref 19–32)
Calcium: 9.3 mg/dL (ref 8.4–10.5)
Chloride: 99 mEq/L (ref 96–112)
Creatinine, Ser: 0.81 mg/dL (ref 0.50–1.10)
GFR calc Af Amer: >90 mL/min (ref >90)
GFR calc non Af Amer: 87 mL/min — ABNORMAL LOW (ref >90)
Glucose, Bld: 92 mg/dL (ref 70–99)
Potassium: 3.4 mEq/L — ABNORMAL LOW (ref 3.5–5.1)
Sodium: 141 mEq/L (ref 135–145)
Total Bilirubin: 0.3 mg/dL (ref 0.3–1.2)
Total Protein: 8 g/dL (ref 6.0–8.3)

## 2013-02-06 LAB — SEDIMENTATION RATE: Sed Rate: 59 mm/hr — ABNORMAL HIGH (ref 0–22)

## 2013-02-06 LAB — URIC ACID: Uric Acid, Serum: 8 mg/dL — ABNORMAL HIGH (ref 2.4–7.0)

## 2013-02-06 MED ORDER — MELOXICAM 15 MG PO TABS: 15.0000 mg | ORAL_TABLET | Freq: Every day | ORAL | Status: DC

## 2013-02-06 MED ORDER — TRAMADOL HCL 50 MG PO TABS: 50.0000 mg | ORAL_TABLET | Freq: Four times a day (QID) | ORAL | Status: DC | PRN

## 2013-02-06 NOTE — Progress Notes (Signed)
VASCULAR LAB PRELIMINARY  PRELIMINARY  PRELIMINARY  PRELIMINARY  Left lower extremity venous Doppler completed.    Preliminary report:  There is no DVT or SVT noted in the left lower extremity.  Micky Sheller, RVT 02/06/2013, 1:49 PM

## 2013-02-06 NOTE — ED Notes (Signed)
Pt  Has  Pain  and  Swelling  Of  The  l  Foot   She  denys  Any  Injury      She is  Sitting  Upright in exam chair  In no  Severe  distyress   She  Reports  Pain on palpation and  Weight  Bearing

## 2013-02-06 NOTE — ED Provider Notes (Signed)
CSN: [id number]     Arrival date & time 02/06/13  1007 History   First MD Initiated Contact with Patient 02/06/13 1105     Chief Complaint  Patient presents with  . Foot Pain   (Consider location/radiation/quality/duration/timing/severity/associated sxs/prior Treatment) HPI Comments: 42 year old female presents complaining of left ankle swelling and tenderness of the past 2 weeks. She does not recall any injury that could have brought this on. The ankle is swollen and tender and does not improve with elevation or with Lasix. She believes she may have gout. She denies fever, chills, chest pain, shortness of breath, history of DVT or PE. She denies history of similar ankle swelling.   Past Medical History  Diagnosis Date  . Sarcoidosis     Followed by Dr. Melvyn Novas; w/ liver involvement per biopsy 12/09, Reversible airway component so started on [facility] 01/2010; HFA 75% p coaching 05/2010  . Hypoxemia     CT angiogram 9/11>> No PE; PFTs 10/11- FEV1 1.20 (49%) with 16% better p B2, DLCO 33%> corrects to 84; O2 sats ok on 4 lpm X rapid walk X 3 laps 05/2010  . Morbid obesity     Target wt= 153 for BMI <30  . QT prolongation   . Diabetes mellitus   . Hypertension   . Hx of cardiac cath 2/08    No CAD, no RAS,  normal EF  . Seborrheic dermatitis of scalp   . 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 Hep B serology neg. but Hep C ab +, HIV neg. 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 Cardiomyopathy, nonischemic     EF 45% 12/10; Echo 7/11 normal EF, PAS 48  . Diabetic retinopathy     Right eye 2/11  . Health maintenance examination     Mammogram 05/2010 Negative; Last Pap smear 03/2008; Last DM eye exam 2/11> mild non-proliferative diabetic retinopathy. OD  . Helicobacter pylori ab+ 05/2011    Pt was symptomatic and treatment planned for 05/2011   Past Surgical History  Procedure Laterality Date  . Tubal ligation    .  Breast surgery    . Cesarean section     Family History  Problem Relation Age of Onset  . Cancer Mother     colon cancer  . Multiple sclerosis Father   . Asthma Sister     in childhood  . Diabetes Father   . Hypertension     History  Substance Use Topics  . Smoking status: Current Every Day Smoker -- 0.50 packs/day for 20 years    Types: Cigarettes  . Smokeless tobacco: Never Used     Comment: Needs to cut back.  . Alcohol Use: No   OB History   Grav Para Term Preterm Abortions TAB SAB Ect Mult Living                 Review of Systems  Constitutional: Negative for fever and chills.  Eyes: Negative for visual disturbance.  Respiratory: Negative for cough and shortness of breath.   Cardiovascular: Positive for leg swelling (see HPI). Negative for chest pain and palpitations.  Gastrointestinal: Negative for nausea, vomiting and abdominal pain.  Endocrine: Negative for polydipsia and polyuria.  Genitourinary: Negative for dysuria, urgency and frequency.  Musculoskeletal: Positive for arthralgias (see HPI regarding foot pain). Negative for myalgias.  Skin: Negative for rash.  Neurological: Negative for dizziness, weakness  and light-headedness.    Allergies  Vicodin  Home Medications   Current Outpatient Rx  Name  Route  Sig  Dispense  Refill  . BENICAR 40 MG tablet      take 1/2 tablet by mouth once daily   30 tablet   11   . benzocaine-resorcinol (VAGISIL) 5-2 % vaginal cream   Vaginal   Place vaginally at bedtime.   60 g   0   . carvedilol (COREG) 25 MG tablet   Oral   Take 1 tablet (25 mg total) by mouth 2 (two) times daily with a meal.   60 tablet   12   . furosemide (LASIX) 40 MG tablet      take 1 tablet by mouth once daily   30 tablet   3   . insulin aspart protamine- aspart (NOVOLOG MIX 70/30) (70-30) 100 UNIT/ML injection      Inject 25 units before breakfast and inject 23 units before dinner.   20 mL   5   . meloxicam (MOBIC) 15 MG  tablet   Oral   Take 1 tablet (15 mg total) by mouth daily.   30 tablet   0   . metFORMIN (GLUCOPHAGE) 1000 MG tablet   Oral   Take 1 tablet (1,000 mg total) by mouth 2 (two) times daily with a meal.   180 tablet   3   . omeprazole (PRILOSEC) 40 MG capsule      take 1 capsule by mouth once daily   30 capsule   3   . potassium chloride SA (K-DUR,KLOR-CON) 20 MEQ tablet   Oral   Take 20 mEq by mouth daily.         . traMADol (ULTRAM) 50 MG tablet   Oral   Take 1 tablet (50 mg total) by mouth every 6 (six) hours as needed for pain.   20 tablet   0   . varenicline (CHANTIX PAK) 0.5 MG X 11 & 1 MG X 42 tablet      Take one 0.5 mg tablet by mouth once daily for 3 days, then increase to one 0.5 mg tablet twice daily for 4 days, then increase to one 1 mg tablet twice daily.   53 tablet   0    BP 158/95  Pulse 72  Temp(Src) 98.3 F (36.8 C) (Oral)  Resp 20  SpO2 99%  LMP 01/22/2013 Physical Exam  Nursing note and vitals reviewed. Constitutional: She is oriented to person, place, and time. Vital signs are normal. She appears well-developed and well-nourished. No distress.  HENT:  Head: Normocephalic and atraumatic.  Cardiovascular: Normal rate, regular rhythm and normal heart sounds.  Exam reveals no gallop and no friction rub.   No murmur heard. Pulmonary/Chest: Effort normal and breath sounds normal. No respiratory distress. She has no wheezes. She has no rales.  Musculoskeletal:       Left foot: She exhibits tenderness (diffuse, worse laterally) and swelling (pitting edema, 2+, of the foot only.  ).  Neurological: She is alert and oriented to person, place, and time. She has normal strength. Coordination normal.  Skin: Skin is warm and dry. No rash noted. She is not diaphoretic.  Psychiatric: She has a normal mood and affect. Judgment normal.    ED Course  Procedures (including critical care time) Labs Review Labs Reviewed  CBC WITH DIFFERENTIAL - Abnormal;  Notable for the following:    RBC 3.62 (*)    Hemoglobin 9.4 (*)  HCT 30.2 (*)    RDW 15.7 (*)    Eosinophils Relative 6 (*)    All other components within normal limits  COMPREHENSIVE METABOLIC PANEL - Abnormal; Notable for the following:    Potassium 3.4 (*)    CO2 34 (*)    Albumin 3.3 (*)    GFR calc non Af Amer 87 (*)    All other components within normal limits  SEDIMENTATION RATE - Abnormal; Notable for the following:    Sed Rate 59 (*)    All other components within normal limits  URIC ACID - Abnormal; Notable for the following:    Uric Acid, Serum 8.0 (*)    All other components within normal limits   Imaging Review No results found.    MDM   1. Right leg swelling   2. Foot pain, right    Venous duplex is negative for DVT. Sedimentation rate is elevated, uric acid is also slightly elevated. She has other lab abnormalities but they are consistent with her previous labs. Treat with anti-inflammatories, she will follow up if not improving.     Meds ordered this encounter  Medications  . meloxicam (MOBIC) 15 MG tablet    Sig: Take 1 tablet (15 mg total) by mouth daily.    Dispense:  30 tablet    Refill:  0    Order Specific Question:  Supervising Provider    Answer:  Jake Michaelis, DAVID C [id number]  . traMADol (ULTRAM) 50 MG tablet    Sig: Take 1 tablet (50 mg total) by mouth every 6 (six) hours as needed for pain.    Dispense:  20 tablet    Refill:  0    Order Specific Question:  Supervising Provider    Answer:  Jake Michaelis, DAVID C [place], PA-C 02/07/13 [phone]

## 2013-02-11 NOTE — ED Provider Notes (Signed)
Medical screening examination/treatment/procedure(s) were performed by a resident physician or non-physician practitioner and as the supervising physician I was immediately available for consultation/collaboration.  Lynne Leader, MD    Gregor Hams, MD 02/11/13 [phone]

## 2013-03-06 ENCOUNTER — Other Ambulatory Visit: Payer: Self-pay

## 2013-04-01 ENCOUNTER — Other Ambulatory Visit: Payer: Self-pay | Admitting: Internal Medicine

## 2013-04-01 DIAGNOSIS — IMO0002 Reserved for concepts with insufficient information to code with codable children: Secondary | ICD-10-CM

## 2013-04-07 ENCOUNTER — Ambulatory Visit: Payer: Medicare Other | Admitting: Internal Medicine

## 2013-04-08 ENCOUNTER — Ambulatory Visit (INDEPENDENT_AMBULATORY_CARE_PROVIDER_SITE_OTHER): Payer: Medicare Other | Admitting: Internal Medicine

## 2013-04-08 ENCOUNTER — Ambulatory Visit (INDEPENDENT_AMBULATORY_CARE_PROVIDER_SITE_OTHER)
Admission: RE | Admit: 2013-04-08 | Discharge: 2013-04-08 | Disposition: A | Discharge status: Home / Self Care | Payer: Medicare Other | Source: Ambulatory Visit | Attending: Internal Medicine | Admitting: Internal Medicine

## 2013-04-08 ENCOUNTER — Encounter: Payer: Self-pay | Admitting: Internal Medicine

## 2013-04-08 VITALS — BP 114/90 | HR 89 | Temp 98.3°F | Ht 61.0 in | Wt 239.0 lb

## 2013-04-08 DIAGNOSIS — J9610 Chronic respiratory failure, unspecified whether with hypoxia or hypercapnia: Secondary | ICD-10-CM

## 2013-04-08 DIAGNOSIS — J984 Other disorders of lung: Secondary | ICD-10-CM | POA: Diagnosis not present

## 2013-04-08 DIAGNOSIS — D869 Sarcoidosis, unspecified: Secondary | ICD-10-CM | POA: Diagnosis not present

## 2013-04-08 NOTE — Progress Notes (Signed)
Subjective:     Patient ID: Melody Gray, female   DOB: Sep 17, 1970   MRN: [id number]   Brief patient profile:  28 yobf  Smoker dx of sarcoidosis in 2001 = sob, cough became 02 dep in July [id number] complicated by [facility]  last on chronic  prednsione 01/2012      History of Present Illness   January 06, 2010 ov  c/o increased SOB. Pt states outpt clinic advised her to follow up with Dr. Melvyn Novas for Sacoidosis. doe on 02 at 2 lpm sitting then 4 with activity gets off at the curb struggles with grocery store. Try taking Prednisone one half even days in am with breakfast  Be sure you take omeprazole Take one 30-60 min before first and last meals of the day  Stop nifedipine  Start Cardizem 240 mg once daily  Wear 24 hours per day, 2 lpm at rest and sleeping, 4lpm with activity, this is the best way to help your heart function better  CT Chest ( repeated with contrast)> no PE, just sarcoid changes   February 09, 2010 Followup sarcoid w/ PFTs. Breathing is the same- no better or worse.on 02 2 lpm w/in and then outside using 4lpm> cc doe x 50 ft still has to stop every aisle at [employer] but not [facility] parking. rec Start Dulera 2 puffs first thing in am and 2 puffs again in pm about 12 hours later and prednisone 10 mg every other day.   May 04, 2010 ov cc doe some better to point to where feels needs 02 less. rec  1) Stay on dulera 200 2 puffs first thing in am and 2 puffs again in pm about 12 hours later  2) Work on perfecting inhaler technique 3) Prednisone 20 mg 1/4 every other day  4) Stay on 02 2lpm 24 hours per day except increase to 4lpm with exercise  5) Weight control t thru ex    10/05/2010 ov/Abbie Jablon back smoking and using 02 most of the time at 2 lpm never up to 4 and prednisone 10mg   One half every other day.  rec 1) Stay on dulera 200 2 puffs first thing in am and 2 puffs again in pm about 12 hours later  2) Work on perfecting inhaler technique: relax and blow all the way out then take a nice  smooth deep breath back in, triggering the inhaler at same time you start breathing  3) Prednisone 10  mg 1/2 every other day  4) Stay on 02 2lpm 24 hours per day except increase to 4lpm with exercise  5) Weight control   thru exercise on 02 4lpm 6) Please see patient coordinator before you leave today  to schedule repeat echo and we will call when it comes back for review   It is critical that you not smoke at all      02/02/2011 f/u ov/Jamy Whyte cc no change doe,  Minimal cough, now on ace and coreg. No excess mucus. rec Change your 02 to 2lpm at bedtime and with any activity outside your house    08/21/2011 f/u ov/Georgianna Band still smoking but on chantix  cc doe x "up to a mile s stopping - off dulera and 02" but last did this one month prior to OV  - no cough or overt sinus or hb symptoms. rec Wear 02 at 2lpm whenever exercise outside your house and always when you sleep.        Admit date: 11/09/2011  Discharge  date: 11/10/2011  PCP: Jerene Pitch, MD  Consultants:Interventional Radiology, ENT  Admitting Diagnosis: Epistaxis  Discharge Diagnosis:  Principal Problem:  *Epistaxis  Secondary Problems:  Sarcoidosis  DIABETES MELLITUS, TYPE II, UNCONTROLLED  Anemia in chronic kidney disease  HYPERTENSION  CONGESTIVE HEART FAILURE  Pulmonary hypertension associated with sarcoidosis  Acute and chronic respiratory failure  Hospital Course  , Intermittent epistaxis for several months w/ previous hospitalization for embolization (09/14/11 by Dr Bryon Lions) admitted with heavy epistaxis. Admitted by IR who performed bilateral superselective embolization of abnormal bilat IMAX branches and abnormal nasal branches of facial artery. Performed under general anesthesia secondary to patient being unable to protect airway from blood loss. Patient transported to MICU post op due to acute and chronic respiratory failure. Patient was placed on stress Patient extubated on 7/12 without difficulty. Other  respiratory issues included likely obesity hypoventilation, sarcoidosis. Patient wasplaced on stress dose steroids while intubated which were converted back to her home PO meds for sarcoid when able to tolerate PO. Marland Kitchen Patient's bleeding increased after extubation. ENT was called and placed bilateral AP packing with 10cm Merocel packs. Due to continued severe epistaxis nonresponsive to multiple cauterization and embolization attempts decision was made to transport to a rhinologist atfor further evaluation and treatment. Patient's hgb decreased from 9.8 on admit to 8.7 in AM before discharge. Patient did become mildly tachycardic with increasing blood loss.  Other active issues  1. HTN-patient restarted on home meds (Lasix, Coreg, and Lisinopril) once able to tolerate PO. Blood pressure likely elevated due to patient distress. Prn Hydralazine ordered on top of home meds.  2. GERD and H. Pylori Hx. Continued on PPI.  3. DM-glucose well controlled on sliding scale. On novolog 70/30 at home-this was initially held and restarted when tolerating PO   11/21/2011 f/u ov/Levie Wages still smoking and 2.5 mg pred qod cc not limited by breathing as long as wears 2lpm with activity, minimal cough improving since et , did not take  bp meds on day of ov rec Stop lisinopril and for the next month take benicar 40 mg one half daily to see if it makes any difference in any of your respiratory symptoms and if not ok to lisinopril when samples run out The key is to stop smoking completely before smoking completely stops you! Please schedule a follow up visit in 3 months but call sooner if needed  Late add:  Try pred 2.5 mg p 3 days   02/12/2012 still smoking f/u ov/Labresha Mellor much better, no cough off acei cc no change doe @ prednisone q 3 days, walking 15 min on 02 - no change on days when misses prednisone.   rec Try off prednisone completely - call if breathing worsens or cough starts back or nausea/ weakness develop      03/25/2012 f/u ov/Anevay Campanella cc rescheduled     04/08/2013 f/u ov/Angelus Hoopes re: sarcoid doe x one aisle at grocery, not wearing 02 with activity Chief Complaint  Patient presents with  . Follow-up    Last seen Nov 2013. Pt reports her breathing is unchanged since her last visit. No new co's today.      No obvious day to day or daytime variabilty or assoc chronic cough or cp or chest tightness, subjective wheeze overt sinus or hb symptoms. No unusual exp hx or h/o childhood pna/ asthma or knowledge of premature birth.  Sleeping ok without nocturnal  or early am exacerbation  of respiratory  c/o's or need for noct saba. Also  denies any obvious fluctuation of symptoms with weather or environmental changes or other aggravating or alleviating factors except as outlined above   Current Medications, Allergies, Complete Past Medical History, Past Surgical History, Family History, and Social History were reviewed in [employer] record.  ROS  The following are not active complaints unless bolded sore throat, dysphagia, dental problems, itching, sneezing,  nasal congestion or excess/ purulent secretions, ear ache,   fever, chills, sweats, unintended wt loss, pleuritic or exertional cp, hemoptysis,  orthopnea pnd or leg swelling, presyncope, palpitations, heartburn, abdominal pain, anorexia, nausea, vomiting, diarrhea  or change in bowel or urinary habits, change in stools or urine, dysuria,hematuria,  rash, arthralgias, visual complaints, headache, numbness weakness or ataxia or problems with walking or coordination,  change in mood/affect or memory.        No obvious daytime variabilty or assoc chronic cough or cp or chest tightness, subjective wheeze overt sinus or hb symptoms. No unusual exp hx or h/o childhood pna/ asthma or premature birth to his knowledge.    Sleeping ok on 02 without nocturnal  or early am exacerbation  of respiratory  c/o's or need for noct saba. Also denies any  obvious fluctuation of symptoms with weather or environmental changes or other aggravating or alleviating factors except as outlined above   ROS  The following are not active complaints unless bolded sore throat, dysphagia, dental problems, itching, sneezing,  nasal congestion or excess/ purulent secretions, ear ache,   fever, chills, sweats, unintended wt loss, pleuritic or exertional cp, hemoptysis,  orthopnea pnd or leg swelling, presyncope, palpitations, heartburn, abdominal pain, anorexia, nausea, vomiting, diarrhea  or change in bowel or urinary habits, change in stools or urine, dysuria,hematuria,  rash, arthralgias, visual complaints, headache, numbness weakness or ataxia or problems with walking or coordination,  change in mood/affect or memory.         Past Medical History:  Sarcoidosis (Dr Ivania Teagarden)-w/ liver involvement per biopsy 03/2008 - Reversible airway component so start [facility] 01/2010 > better  - HFa 75% p coaching May 04, 2010  - ? Off prednisone 05/2011 Unexplained Hypoxemia July 2011  - CT angiogram 01/07/10 >>> no PE  - PFT's February 09, 2010 FEV1 1.20 (49%) with ratio 72 and FRC 90%, FEV1  16% better p B2, DLC0 33% > corrects to 84   Morbid obesity  - Target wt = 153 for BMI < 30  QT prolongation  Diabetes mellitus, type II  Hypertension  Heart catheterization 06-06-06 : No CAD, no RAS, normal EF  Hx eclampsia  Hx of scalp seborrheic dermatitis            Objective:   Physical Exam She is a minimally  cushingoid-appearing amb bf nad off 02  wt 238 January 06, 2010 >   217 May 04, 2010 >  02/12/2012  213 >  215 04/04/2012  > 239 04/08/2013  There is no stigmata liver disease  skin: anicteric  HEENT: normocephalic; PEERLA; no nasal or pharyngeal abnormalities  neck: supple  nodes: no cervical lymphadenopathy  chest: clear to ausculatation and percussion  heart: no murmurs, gallops, or rubs  abd: soft, nontender; BS normoactive; no abdominal masses,  tenderness, organomegaly; abdomen is obese     CXR  04/08/2013 : Mild scarring within the lung bases. Findings suggestive of mediastinal adenopathy.           Assessment:

## 2013-04-08 NOTE — Patient Instructions (Addendum)
Please remember to go to the  x-ray department downstairs for your tests - we will call you with the results when they are available.     Weight control is simply a matter of calorie balance which needs to be tilted in your favor by eating less and exercising more.  To get the most out of exercise, you need to be continuously aware that you are short of breath, but never out of breath, for 30 minutes daily. As you improve, it will actually be easier for you to do the same amount of exercise  in  30 minutes on 02 2lpm so always push to the level where you are short of breath.  If this does not result in gradual weight reduction then I strongly recommend you see a nutritionist with a food diary x 2 weeks so that we can work out a negative calorie balance which is universally effective in steady weight loss programs.  Think of your calorie balance like you do your bank account where in this case you want the balance to go down so you must take in less calories than you burn up.  It's just that simple:  Hard to do, but easy to understand.  Good luck!   Pulmonary follow up can be as needed.

## 2013-04-09 NOTE — Assessment & Plan Note (Signed)
-   PFT's 11/21/2011 FEV1 1.23 (50%) ratio 67 and no better p B2,  DLCO 31 corrects to 73   - trial off prednisone 02/12/2012   No evidence of dz activity - most of her symptoms are related to obesity and chf  F/u pulmonary prn and work on wt    Each maintenance medication was reviewed in detail including most importantly the difference between maintenance and as needed and under what circumstances the prns are to be used.  Please see instructions for details which were reviewed in writing and the patient given a copy.

## 2013-04-09 NOTE — Assessment & Plan Note (Signed)
Target Wt < 158 to get BMI < 30  - PFT's 11/21/2011 ERV 39% c/w effects of obesity.  Calorie balance issues reviewed - See instructions for specific recommendations which were reviewed directly with the patient who was given a copy with highlighter outlining the key components.

## 2013-04-09 NOTE — Progress Notes (Signed)
Quick Note:  Spoke with pt and notified of results per Dr. Wert. Pt verbalized understanding and denied any questions.  ______ 

## 2013-04-09 NOTE — Assessment & Plan Note (Signed)
-    Complicated by PAH - Walked 3 laps @ 185 ft each stopped due to  End of test, no desat on 4lpm 10/05/2010  - 02/02/2011  Walked RA  2 laps @ 185 ft each stopped due to  desat to 88 so change rx to 2lpm with activity outside the house and at hs - 08/21/2011  Walked RA x 1 laps @ 185 ft each stopped due to desat 85% - 11/21/2011  Walked RA  2 laps @ 185 ft each stopped due to  Sat 88 -   ONO RA < 89% x 6h 26 min 03/19/12 > repeat on 2lpm 04/05/12 > desats resolved - 04/08/2013   Walked RA x one lap @ 185 stopped due to  sats 85%   rec walk on 2lpm if more than room to room , ok to take off at rest x wear q hs

## 2013-04-22 ENCOUNTER — Telehealth: Payer: Self-pay | Admitting: *Deleted

## 2013-04-22 ENCOUNTER — Encounter: Payer: Self-pay | Admitting: Internal Medicine

## 2013-04-22 MED ORDER — MELOXICAM 15 MG PO TABS: 15.0000 mg | ORAL_TABLET | Freq: Every day | ORAL | Status: DC

## 2013-04-22 MED ORDER — TRAMADOL HCL 50 MG PO TABS: 50.0000 mg | ORAL_TABLET | Freq: Four times a day (QID) | ORAL | Status: DC | PRN

## 2013-04-22 NOTE — Telephone Encounter (Signed)
Chart reviewed.  Tramadol and meloxicam renewed for re-exacerbation of foot pain.

## 2013-04-22 NOTE — Telephone Encounter (Signed)
Pt is requesting refill on tramadol and meloxicam - pt states for arthritis foot. Both are on med hx sheet from 01/2013. Pt uses [facility]. Pt requested meds on computer. Hilda Blades Enora Trillo RN 04/22/13 4PM

## 2013-04-22 NOTE — Telephone Encounter (Signed)
Rx called in to pharmacy and pt aware.

## 2013-05-06 ENCOUNTER — Other Ambulatory Visit: Payer: Self-pay | Admitting: *Deleted

## 2013-05-06 DIAGNOSIS — E1165 Type 2 diabetes mellitus with hyperglycemia: Secondary | ICD-10-CM

## 2013-05-06 DIAGNOSIS — E11319 Type 2 diabetes mellitus with unspecified diabetic retinopathy without macular edema: Secondary | ICD-10-CM

## 2013-05-06 DIAGNOSIS — IMO0001 Reserved for inherently not codable concepts without codable children: Secondary | ICD-10-CM

## 2013-05-06 MED ORDER — INSULIN ASPART PROT & ASPART (70-30 MIX) 100 UNIT/ML ~~LOC~~ SUSP: 25.0000 [IU] | Freq: Two times a day (BID) | SUBCUTANEOUS | Status: DC

## 2013-05-06 MED ORDER — "INSULIN SYRINGE-NEEDLE U-100 28G X 1/2"" 0.5 ML MISC": 1.0000 | Status: DC

## 2013-05-15 ENCOUNTER — Encounter: Payer: Medicare Other | Admitting: Obstetrics and Gynecology

## 2013-05-15 NOTE — Addendum Note (Signed)
Addended by: Hulan Fray on: 05/15/2013 07:10 PM   Modules accepted: Orders

## 2013-05-16 ENCOUNTER — Ambulatory Visit: Payer: Medicare Other | Admitting: Cardiology

## 2013-05-16 ENCOUNTER — Other Ambulatory Visit: Payer: Self-pay | Admitting: Internal Medicine

## 2013-05-19 ENCOUNTER — Other Ambulatory Visit: Payer: Self-pay | Admitting: Internal Medicine

## 2013-05-20 ENCOUNTER — Encounter: Payer: Self-pay | Admitting: Internal Medicine

## 2013-05-20 ENCOUNTER — Ambulatory Visit (INDEPENDENT_AMBULATORY_CARE_PROVIDER_SITE_OTHER): Payer: Medicare Other | Admitting: Internal Medicine

## 2013-05-20 VITALS — BP 151/88 | HR 104 | Temp 97.2°F | Ht 62.0 in | Wt 243.3 lb

## 2013-05-20 DIAGNOSIS — Z23 Encounter for immunization: Secondary | ICD-10-CM | POA: Diagnosis not present

## 2013-05-20 DIAGNOSIS — I10 Essential (primary) hypertension: Secondary | ICD-10-CM

## 2013-05-20 DIAGNOSIS — M79609 Pain in unspecified limb: Secondary | ICD-10-CM | POA: Diagnosis not present

## 2013-05-20 DIAGNOSIS — F17200 Nicotine dependence, unspecified, uncomplicated: Secondary | ICD-10-CM | POA: Diagnosis not present

## 2013-05-20 DIAGNOSIS — E1165 Type 2 diabetes mellitus with hyperglycemia: Secondary | ICD-10-CM

## 2013-05-20 DIAGNOSIS — E119 Type 2 diabetes mellitus without complications: Secondary | ICD-10-CM

## 2013-05-20 DIAGNOSIS — M79652 Pain in left thigh: Secondary | ICD-10-CM

## 2013-05-20 DIAGNOSIS — E876 Hypokalemia: Secondary | ICD-10-CM

## 2013-05-20 DIAGNOSIS — E669 Obesity, unspecified: Secondary | ICD-10-CM

## 2013-05-20 DIAGNOSIS — IMO0001 Reserved for inherently not codable concepts without codable children: Secondary | ICD-10-CM | POA: Diagnosis not present

## 2013-05-20 LAB — GLUCOSE, CAPILLARY: Glucose-Capillary: 105 mg/dL — ABNORMAL HIGH (ref 70–99)

## 2013-05-20 LAB — POCT GLYCOSYLATED HEMOGLOBIN (HGB A1C): Hemoglobin A1C: 6.5

## 2013-05-20 MED ORDER — FUROSEMIDE 40 MG PO TABS: ORAL_TABLET | ORAL | Status: DC

## 2013-05-20 MED ORDER — FUROSEMIDE 40 MG PO TABS
ORAL_TABLET | ORAL | Status: DC
Start: 2013-05-20 — End: 2013-05-20

## 2013-05-20 MED ORDER — OMEPRAZOLE 40 MG PO CPDR: DELAYED_RELEASE_CAPSULE | ORAL | Status: DC

## 2013-05-20 MED ORDER — METFORMIN HCL 1000 MG PO TABS: 1000.0000 mg | ORAL_TABLET | Freq: Two times a day (BID) | ORAL | Status: DC

## 2013-05-20 NOTE — Progress Notes (Signed)
Subjective:   Patient ID: Melody Gray female   DOB: 1970-07-14 43 y.o.   MRN: [id number]  HPI: Melody Gray is a very pleasant 43 y.o. African American female with PMH of Sarcoidosis O2 dependent, chronic tobacco dependence, morbid obesity, DM2 well controlled, HTN, and NICM presenting to [facility] today for routine visit.  She was last seen by Dr. Melvyn Novas in December 2014 and counseled again on smoking cessation and weight loss, both of which she has not done but says she will continue to work at it.  She is asking about possibility of seeing CDE or bariatric clinic if she can afford it to help her with diet plan.    In regards to her diabetes, her A1C is very well controlled at 6.5.  She did not bring her meter but reports compliance to her medications with metformin 1000mg  bid and 70/30 25 units in AM and takes 23 units in PM (was supposed to be on 25 units BID).  She denies any hypoglycemic episodes. She admits to a poor diet, eats potato chips, sometimes skips regular meals but snacks often and likes to eat late at night.  Lately, she has been eating an orange in the middle of the night. She is willing to discuss with [name] possible diet plan.    She was last seen by Dr. Stanford Breed in February 2014. She has not taken her lasix for a few days since she ran out. Her weight is up 4 pounds since 03/2013.  She has been on 40mg  lasix for some time now and may eventually be able to get to a PRN basis?  She is requesting 3 month refills on her medications if possible.   Past Medical History  Diagnosis Date  . Sarcoidosis     Followed by Dr. Melvyn Novas; w/ liver involvement per biopsy 12/09, Reversible airway component so started on [facility] 01/2010; HFA 75% p coaching 05/2010  . Hypoxemia     CT angiogram 9/11>> No PE; PFTs 10/11- FEV1 1.20 (49%) with 16% better p B2, DLCO 33%> corrects to 84; O2 sats ok on 4 lpm X rapid walk X 3 laps 05/2010  . Morbid obesity     Target wt= 153 for BMI <30  . QT  prolongation   . Diabetes mellitus   . Hypertension   . Hx of cardiac cath 2/08    No CAD, no RAS,  normal EF  . Seborrheic dermatitis of scalp   . 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 Hep B serology neg. but Hep C ab +, HIV neg. 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 Cardiomyopathy, nonischemic     EF 45% 12/10; Echo 7/11 normal EF, PAS 48  . Diabetic retinopathy     Right eye 2/11  . Health maintenance examination     Mammogram 05/2010 Negative; Last Pap smear 03/2008; Last DM eye exam 2/11> mild non-proliferative diabetic retinopathy. OD  . Helicobacter pylori ab+ 05/2011    Pt was symptomatic and treatment planned for 05/2011   Current Outpatient Prescriptions  Medication Sig Dispense Refill  . BENICAR 40 MG tablet take 1/2 tablet by mouth once daily  30 tablet  11  . carvedilol (COREG) 25 MG tablet Take 1 tablet (25 mg total) by mouth 2 (two) times daily with a meal.  60 tablet  12  . furosemide (LASIX) 40 MG  tablet take 1 tablet by mouth once daily  90 tablet  2  . insulin aspart protamine- aspart (NOVOLOG MIX 70/30) (70-30) 100 UNIT/ML injection Inject 25 Units into the skin 2 (two) times daily with a meal. Taking 25 in AM and 23 in PM      . Insulin Syringe-Needle U-100 28G X 1/2" 0.5 ML MISC 1 Syringe by Does not apply route as directed.  100 each  5  . metFORMIN (GLUCOPHAGE) 1000 MG tablet Take 1 tablet (1,000 mg total) by mouth 2 (two) times daily with a meal.  180 tablet  4  . omeprazole (PRILOSEC) 40 MG capsule take 1 capsule by mouth once daily  90 capsule  5  . traMADol (ULTRAM) 50 MG tablet Take 1 tablet (50 mg total) by mouth every 6 (six) hours as needed.  20 tablet  0  . benzocaine-resorcinol (VAGISIL) 5-2 % vaginal cream Place vaginally at bedtime.  60 g  0  . meloxicam (MOBIC) 15 MG tablet Take 1 tablet (15 mg total) by mouth daily.  30 tablet  0   No current facility-administered  medications for this visit.   Family History  Problem Relation Age of Onset  . Cancer Mother     colon cancer  . Multiple sclerosis Father   . Asthma Sister     in childhood  . Diabetes Father   . Hypertension     History   Social History  . Marital Status: Single    Spouse Name: N/A    Number of Children: 2  . Years of Education: N/A   Occupational History  . 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 Smoking status: Current Every Day Smoker -- 0.50 packs/day for 20 years    Types: Cigarettes  . Smokeless tobacco: Never Used     Comment: Needs to cut back.  . Alcohol Use: No  . Drug Use: No  . Sexual Activity: None   Other Topics Concern  . None   Social History Narrative   Diabetic card given 05/03/2010.   Financial assistance approved for 100% discount at [facility] and has [facility] card. [name] 12/07/2009.      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   Review of Systems:  Constitutional:  Easily tired. Denies fever, chills, diaphoresis.   HEENT:  Denies congestion, rhinorrhea, sneezing   Respiratory:  SOB, DOE. Denies cough, and wheezing.   Cardiovascular:  LLE swelling. Denies chest pain, palpitations.  Gastrointestinal:  Denies nausea, vomiting, abdominal pain  Genitourinary:  Vaginal itching at times. Denies dysuria, urgency, frequency.   Musculoskeletal:  Arthralgias, occasional L lateral thigh pain.   Skin:  Denies pallor, rash and wound.   Neurological:  Denies dizziness, seizures, syncope, light-headedness, numbness and headaches.    Objective:  Physical Exam: Filed Vitals:   05/20/13 1451  BP: 151/88  Pulse: 104  Temp: 97.2 F (36.2 C)  TempSrc: Oral  Height: 5\' 2"  (1.575 m)  Weight: 243 lb 4.8 oz (110.36 kg)  SpO2: 86%   Vitals reviewed. General: sitting in chair, NAD, on [place] O2 HEENT: PERRL, EOMI Cardiac: Tachycardia Pulm: clear to auscultation bilaterally, no wheezes Abd: soft, obese,  nontender, nondistended, BS present Ext: warm and well perfused, +1 pitting edema LLE, no edema, no tenderness to palpation of b/l hips, good ROM of all extremities, no tenderness to palpation of b/l extremities, +2DP B/L Neuro: alert and oriented  X3, cranial nerves II-XII grossly intact, strength equal in bilateral upper and lower extremities  Assessment & Plan:  Discussed with Dr. Eppie Gibson -flu and pneumococcal vaccine today -checked hba1c and foot exam -BMET

## 2013-05-20 NOTE — Patient Instructions (Addendum)
Dear Melody Gray, thank you for coming in today  PLEASE work on your weight loss and improved diet as we discussed today and walking as tolerated  I will have our CDE contact you in regards to diet control  Please get your eye exam done and you have gotten your flu and pneumonia vaccine today  Please continue to follow up with Dr. Melvyn Novas and Dr. Stanford Breed  PLEASE STOP SMOKING!!! Or at least start cutting down  Follow up in 3-6 months, you are doing great with your diabetes control  Smoking Cessation, Tips for Success If you are ready to quit smoking, congratulations! You have chosen to help yourself be healthier. Cigarettes bring nicotine, tar, carbon monoxide, and other irritants into your body. Your lungs, heart, and blood vessels will be able to work better without these poisons. There are many different ways to quit smoking. Nicotine gum, nicotine patches, a nicotine inhaler, or nicotine nasal spray can help with physical craving. Hypnosis, support groups, and medicines help break the habit of smoking. WHAT THINGS CAN I DO TO MAKE QUITTING EASIER?  Here are some tips to help you quit for good:  Pick a date when you will quit smoking completely. Tell all of your friends and family about your plan to quit on that date.  Do not try to slowly cut down on the number of cigarettes you are smoking. Pick a quit date and quit smoking completely starting on that day.  Throw away all cigarettes.   Clean and remove all ashtrays from your home, work, and car.   On a card, write down your reasons for quitting. Carry the card with you and read it when you get the urge to smoke.   Cleanse your body of nicotine. Drink enough water and fluids to keep your urine clear or pale yellow. Do this after quitting to flush the nicotine from your body.   Learn to predict your moods. Do not let a bad situation be your excuse to have a cigarette. Some situations in your life might tempt you into wanting a  cigarette.   Never have "just one" cigarette. It leads to wanting another and another. Remind yourself of your decision to quit.   Change habits associated with smoking. If you smoked while driving or when feeling stressed, try other activities to replace smoking. Stand up when drinking your coffee. Brush your teeth after eating. Sit in a different chair when you read the paper. Avoid alcohol while trying to quit, and try to drink fewer caffeinated beverages. Alcohol and caffeine may urge you to smoke.   Avoid foods and drinks that can trigger a desire to smoke, such as sugary or spicy foods and alcohol.   Ask people who smoke not to smoke around you.   Have something planned to do right after eating or having a cup of coffee. For example, plan to take a walk or exercise.   Try a relaxation exercise to calm you down and decrease your stress. Remember, you may be tense and nervous for the first 2 weeks after you quit, but this will pass.   Find new activities to keep your hands busy. Play with a pen, coin, or rubber band. Doodle or draw things on paper.   Brush your teeth right after eating. This will help cut down on the craving for the taste of tobacco after meals. You can also try mouthwash.   Use oral substitutes in place of cigarettes. Try using lemon drops, carrots, cinnamon  sticks, or chewing gum. Keep them handy so they are available when you have the urge to smoke.   When you have the urge to smoke, try deep breathing.   Designate your home as a nonsmoking area.   If you are a heavy smoker, ask your health care provider about a prescription for nicotine chewing gum. It can ease your withdrawal from nicotine.   Reward yourself. Set aside the cigarette money you save and buy yourself something nice.   Look for support from others. Join a support group or smoking cessation program. Ask someone at home or at work to help you with your plan to quit smoking.   Always  ask yourself, "Do I need this cigarette or is this just a reflex?" Tell yourself, "Today, I choose not to smoke," or "I do not want to smoke." You are reminding yourself of your decision to quit.  Do not replace cigarette smoking with electronic cigarettes (commonly called e-cigarettes). The safety of e-cigarettes is unknown, and some may contain harmful chemicals.  If you relapse, do not give up! Plan ahead and think about what you will do the next time you get the urge to smoke.  HOW WILL I FEEL WHEN I QUIT SMOKING? You may have symptoms of withdrawal because your body is used to nicotine (the addictive substance in cigarettes). You may crave cigarettes, be irritable, feel very hungry, cough often, get headaches, or have difficulty concentrating. The withdrawal symptoms are only temporary. They are strongest when you first quit but will go away within 10 14 days. When withdrawal symptoms occur, stay in control. Think about your reasons for quitting. Remind yourself that these are signs that your body is healing and getting used to being without cigarettes. Remember that withdrawal symptoms are easier to treat than the major diseases that smoking can cause.  Even after the withdrawal is over, expect periodic urges to smoke. However, these cravings are generally short lived and will go away whether you smoke or not. Do not smoke!  WHAT RESOURCES ARE AVAILABLE TO HELP ME QUIT SMOKING? Your health care provider can direct you to community resources or hospitals for support, which may include:  Group support.  Education.  Hypnosis.  Therapy. Document Released: 01/14/2004 Document Revised: 02/05/2013 Document Reviewed: 10/03/2012 [facility] Patient Information 2014 [place], [place].  Diabetes Meal Planning Guide The diabetes meal planning guide is a tool to help you plan your meals and snacks. It is important for people with diabetes to manage their blood glucose (sugar) levels. Choosing the right  foods and the right amounts throughout your day will help control your blood glucose. Eating right can even help you improve your blood pressure and reach or maintain a healthy weight. CARBOHYDRATE COUNTING MADE EASY When you eat carbohydrates, they turn to sugar. This raises your blood glucose level. Counting carbohydrates can help you control this level so you feel better. When you plan your meals by counting carbohydrates, you can have more flexibility in what you eat and balance your medicine with your food intake. Carbohydrate counting simply means adding up the total amount of carbohydrate grams in your meals and snacks. Try to eat about the same amount at each meal. Foods with carbohydrates are listed below. Each portion below is 1 carbohydrate serving or 15 grams of carbohydrates. Ask your dietician how many grams of carbohydrates you should eat at each meal or snack. Grains and Starches  1 slice bread.   English muffin or hotdog/hamburger bun.  cup cold cereal (unsweetened).   cup cooked pasta or rice.   cup starchy vegetables (corn, potatoes, peas, beans, winter squash).  1 tortilla (6 inches).   bagel.  1 waffle or pancake (size of a CD).   cup cooked cereal.  4 to 6 small crackers. *Whole grain is recommended. Fruit  1 cup fresh unsweetened berries, melon, papaya, pineapple.  1 small fresh fruit.   banana or mango.   cup fruit juice (4 oz unsweetened).   cup canned fruit in natural juice or water.  2 tbs dried fruit.  12 to 15 grapes or cherries. Milk and Yogurt  1 cup fat-free or 1% milk.  1 cup soy milk.  6 oz light yogurt with sugar-free sweetener.  6 oz low-fat soy yogurt.  6 oz plain yogurt. Vegetables  1 cup raw or  cup cooked is counted as 0 carbohydrates or a "free" food.  If you eat 3 or more servings at 1 meal, count them as 1 carbohydrate serving. Other Carbohydrates   oz chips or pretzels.   cup ice cream or frozen  yogurt.   cup sherbet or sorbet.  2 inch square cake, no frosting.  1 tbs honey, sugar, jam, jelly, or syrup.  2 small cookies.  3 squares of graham crackers.  3 cups popcorn.  6 crackers.  1 cup broth-based soup.  Count 1 cup casserole or other mixed foods as 2 carbohydrate servings.  Foods with less than 20 calories in a serving may be counted as 0 carbohydrates or a "free" food. You may want to purchase a book or computer software that lists the carbohydrate gram counts of different foods. In addition, the nutrition facts panel on the labels of the foods you eat are a good source of this information. The label will tell you how big the serving size is and the total number of carbohydrate grams you will be eating per serving. Divide this number by 15 to obtain the number of carbohydrate servings in a portion. Remember, 1 carbohydrate serving equals 15 grams of carbohydrate. SERVING SIZES Measuring foods and serving sizes helps you make sure you are getting the right amount of food. The list below tells how big or small some common serving sizes are.  1 oz.........4 stacked dice.  3 oz........Marland KitchenDeck of cards.  1 tsp.......Marland KitchenTip of little finger.  1 tbs......Marland KitchenMarland KitchenThumb.  2 tbs.......Marland KitchenGolf ball.   cup......Marland KitchenHalf of a fist.  1 cup.......Marland KitchenA fist. SAMPLE DIABETES MEAL PLAN Below is a sample meal plan that includes foods from the grain and starches, dairy, vegetable, fruit, and meat groups. A dietician can individualize a meal plan to fit your calorie needs and tell you the number of servings needed from each food group. However, controlling the total amount of carbohydrates in your meal or snack is more important than making sure you include all of the food groups at every meal. You may interchange carbohydrate containing foods (dairy, starches, and fruits). The meal plan below is an example of a 2000 calorie diet using carbohydrate counting. This meal plan has 17 carbohydrate  servings. Breakfast  1 cup oatmeal (2 carb servings).   cup light yogurt (1 carb serving).  1 cup blueberries (1 carb serving).   cup almonds. Snack  1 large apple (2 carb servings).  1 low-fat string cheese stick. Lunch  Chicken breast salad.  1 cup spinach.   cup chopped tomatoes.  2 oz chicken breast, sliced.  2 tbs low-fat [place] dressing.  12 whole-wheat crackers (  2 carb servings).  12 to 15 grapes (1 carb serving).  1 cup low-fat milk (1 carb serving). Snack  1 cup carrots.   cup hummus (1 carb serving). Dinner  3 oz broiled salmon.  1 cup brown rice (3 carb servings). Snack  1  cups steamed broccoli (1 carb serving) drizzled with 1 tsp olive oil and lemon juice.  1 cup light pudding (2 carb servings). DIABETES MEAL PLANNING WORKSHEET Your dietician can use this worksheet to help you decide how many servings of foods and what types of foods are right for you.  BREAKFAST Food Group and Servings / Carb Servings Grain/Starches __________________________________ Dairy __________________________________________ Vegetable ______________________________________ Fruit ___________________________________________ Meat __________________________________________ Fat ____________________________________________ LUNCH Food Group and Servings / Carb Servings Grain/Starches ___________________________________ Dairy ___________________________________________ Fruit ____________________________________________ Meat ___________________________________________ Fat _____________________________________________ Melody Gray Food Group and Servings / Carb Servings Grain/Starches ___________________________________ Dairy ___________________________________________ Fruit ____________________________________________ Meat ___________________________________________ Fat _____________________________________________ SNACKS Food Group and Servings / Carb  Servings Grain/Starches ___________________________________ Dairy ___________________________________________ Vegetable _______________________________________ Fruit ____________________________________________ Meat ___________________________________________ Fat _____________________________________________ DAILY TOTALS Starches _________________________ Vegetable ________________________ Fruit ____________________________ Dairy ____________________________ Meat ____________________________ Fat ______________________________ Document Released: 01/12/2005 Document Revised: 07/10/2011 Document Reviewed: 11/23/2008 [employer] Patient Information 2014 [employer].

## 2013-05-20 NOTE — Assessment & Plan Note (Addendum)
Lab Results  Component Value Date   HGBA1C 6.5 05/20/2013   HGBA1C 6.3 09/03/2012   HGBA1C 7.0 04/16/2012    Assessment: Diabetes control: good control (HgbA1C at goal) Progress toward A1C goal:  at goal  Plan: Medications:  continue current medications 70/30 25 units in AM and 23 units in PM, metformin 1000mg  bid Home glucose monitoring: Frequency: 3 times a day Timing: before meals Instruction/counseling given: reminded to get eye exam, reminded to bring blood glucose meter & log to each visit, reminded to bring medications to each visit, discussed foot care, discussed the need for weight loss and discussed diet Educational resources provided:   Self management tools provided:   Other plans: discuss with CDE about diet

## 2013-05-20 NOTE — Assessment & Plan Note (Signed)
Up 4 pounds since December. Counseled extensively on weight loss and exercise as tolerated and working on improved diet.  -discuss diet plan with CDE

## 2013-05-20 NOTE — Assessment & Plan Note (Signed)
  Assessment: Progress toward smoking cessation:  smoking the same amount Barriers to progress toward smoking cessation:    Comments: smokes 1 pack in 3 days  Plan: Instruction/counseling given:  I counseled patient on the dangers of tobacco use, advised patient to stop smoking, and reviewed strategies to maximize success. Educational resources provided:    Self management tools provided:    Medications to assist with smoking cessation:  None Patient agreed to the following self-care plans for smoking cessation: call [facility] (1-800-QUIT-NOW)  Other plans: no motivation to quit, but maybe cut down

## 2013-05-20 NOTE — Assessment & Plan Note (Signed)
Occasional, chronic, ?muscular pain left lateral thigh.  No specific exacerbating or alleviating factors. Improves with mobic or tramadol prn. No tenderness on palpation to area or hip. Noted to have pitting edema of LLE compared to RLE but no tenderness, swelling, or erythema. Unclear etiology, possibly meralgia paresthetica vs. Muscle sprain vs. Avascular necrosis (steroid use in past with sarcoidosis but not currently) vs. Adenopathy in setting of sarcoidosis affecting the LLE vs. Diabetic amyotrophy (although unlikely no weight loss or weakness).   -noted to have xray's and MRI of L hip ordered in the past 2012 but never done, unclear why. Patient reports it has been done in the past and nothing was noted, however no records. Will need to clarify. -monitor for now, if pain continues at time of next visit will proceed with xray and further imaging and workup if needed

## 2013-05-20 NOTE — Assessment & Plan Note (Signed)
BP Readings from Last 3 Encounters:  05/20/13 151/88  04/08/13 114/90  02/06/13 158/95   Lab Results  Component Value Date   NA 141 02/06/2013   K 3.4* 02/06/2013   CREATININE 0.81 02/06/2013   Assessment: Blood pressure control: mildly elevated Progress toward BP goal:  deteriorated  Plan: Medications:  continue current medications benicar 40mg , coreg 25mg  bid, and lasix 40mg  qd. May be able to take lasix prn?? Educational resources provided:   Self management tools provided:   Other plans: needs to follow up with cards

## 2013-05-21 LAB — BASIC METABOLIC PANEL
BUN: 11 mg/dL (ref 6–23)
CO2: 35 mEq/L — ABNORMAL HIGH (ref 19–32)
Calcium: 8.9 mg/dL (ref 8.4–10.5)
Chloride: 97 mEq/L (ref 96–112)
Creat: 0.84 mg/dL (ref 0.50–1.10)
Glucose, Bld: 117 mg/dL — ABNORMAL HIGH (ref 70–99)
Potassium: 3.9 mEq/L (ref 3.5–5.3)
Sodium: 138 mEq/L (ref 135–145)

## 2013-05-23 ENCOUNTER — Other Ambulatory Visit: Payer: Self-pay | Admitting: Internal Medicine

## 2013-05-23 DIAGNOSIS — Z7189 Other specified counseling: Secondary | ICD-10-CM

## 2013-05-25 NOTE — Progress Notes (Signed)
Case discussed with Dr. Qureshi at time of visit.  We reviewed the resident's history and exam and pertinent patient test results.  I agree with the assessment, diagnosis, and plan of care documented in the resident's note. 

## 2013-06-05 ENCOUNTER — Other Ambulatory Visit: Payer: Self-pay | Admitting: *Deleted

## 2013-06-05 MED ORDER — INSULIN ASPART PROT & ASPART (70-30 MIX) 100 UNIT/ML ~~LOC~~ SUSP: 25.0000 [IU] | Freq: Two times a day (BID) | SUBCUTANEOUS | Status: DC

## 2013-06-17 ENCOUNTER — Telehealth: Payer: Self-pay | Admitting: Dietician

## 2013-06-17 NOTE — Telephone Encounter (Signed)
Called to follow up on getting new Medicaid card so she can schedule an annual eye exam. She says she still has to contact socail services about her Medicaid card. She agreed to call us to let us know when and where she has made an appointment for an eye exam.

## 2013-06-20 ENCOUNTER — Encounter: Payer: Self-pay | Admitting: Cardiology

## 2013-06-25 ENCOUNTER — Other Ambulatory Visit: Payer: Self-pay | Admitting: Cardiology

## 2013-07-01 ENCOUNTER — Encounter: Payer: Medicare Other | Admitting: Dietician

## 2013-07-04 ENCOUNTER — Ambulatory Visit (INDEPENDENT_AMBULATORY_CARE_PROVIDER_SITE_OTHER): Payer: Medicare Other | Admitting: Dietician

## 2013-07-04 ENCOUNTER — Other Ambulatory Visit: Payer: Self-pay | Admitting: Dietician

## 2013-07-04 VITALS — Wt 235.9 lb

## 2013-07-04 DIAGNOSIS — E119 Type 2 diabetes mellitus without complications: Secondary | ICD-10-CM

## 2013-07-04 LAB — HM DIABETES EYE EXAM

## 2013-07-04 NOTE — Progress Notes (Signed)
Medical Nutrition Therapy:  Appt start time: 1000 end time:  1030.  Assessment:  Primary concerns today: Weight management, Blood sugar control and Meal planning.  Patient presented asking about a high protein diet and a structured meal plan. She changed her mind later in visit about wanting any meal plan input.  Usual eating pattern includes 1-3 meals per day.  She reports that somedays she is very hungry and other not hungry at all. Bowels are normal.   24-hr recall not done today as patient decided she did not want  A meal plan ( after initialing asking for rigid meal plan rather than suggestions for what to eat more often and less often)  CBG today in office 128 ~ 2 hours after taking insulin, metformin and has not had any food. She does not check blood sugars- last time she checked was that last time she was in our office. With goals of weight loss, blood sugar control and not having to check blood sugar, it would be safest for her to be on medicines for her diabetes that do not cause low blood sugar or are less likely to cause low blood sugar like lantus. Patient reports history of not being abel to control her blood sugar on diabetes pills or basal only insulin at a time when she was eating more heavily and significantly more carbs.  Usual physical activity includes walking when she can(weather and pain permitting) . Weight- 235.9# A1C 6.5%  Progress Towards Goal(s):  In progress. ( says she lost 3 # from her last visit and actually lost ~ 8#)    Nutritional Diagnosis:  NB-1.1 Food and nutrition-related knowledge deficit as related to lack of knowledge and desire to self manage blood sugars to encourage weight loss and decrease low blood sugars as evidenced by her interest about diabetes medicines and their side effects.  NB-1.4 Self-monitoring deficit As related to lack of desire to check blood sugars for multiple reasons.  As evidenced by her report and not having checked since last office  visit. patient also reports feeling very hungry with possible symptoms of low blood sugar, and she eats but does not check blood sugar. .    Intervention:  Nutrition education about diabetes medicine and their side effects including their effects on blood sugar and weight.  Nutrition education- about checking a weekly CBG profile rather than every day testing as a doable option  Coordination of care- consider an incretin mimetic and basal & metformin  vs retry of basal & metformin with low carb meal plan to encourage weight loss and safety ( lower risk of hypoglycemia)   Monitoring/Evaluation:  Dietary intake, exercise, blood sugars per patient desire, weight, prn ( suggest more frequent visits to assist patient with monitoring progress if patient agrees.

## 2013-07-04 NOTE — Progress Notes (Signed)
Need referral for 2015

## 2013-07-04 NOTE — Patient Instructions (Signed)
Tamyah,  You lost 8 pounds since January!!! Whatever you are doing - it is working!!!! ( I had to let you  Know it is more than 3#)  Butch Penny [phone]

## 2013-07-18 ENCOUNTER — Telehealth: Payer: Self-pay | Admitting: *Deleted

## 2013-07-18 ENCOUNTER — Encounter: Payer: Self-pay | Admitting: Internal Medicine

## 2013-07-18 NOTE — Telephone Encounter (Signed)
Thank you. She needs to go to UC as recommended. Please also have donna touch base with her if possible

## 2013-07-18 NOTE — Telephone Encounter (Signed)
Pt sent an email stating her blood sugar is elevated from its normal, asking advice on what to do. Pt was called and is scheduled for Monday but as we talked about her blood sugars she reported needing to increase her 02 from 2L to 4L and feeling weak, having a cough since Monday 3/16. States she does not know if she has fevers. She is then advised to go to urgent care this pm for eval and keep appt Monday for f/u.

## 2013-07-19 ENCOUNTER — Emergency Department (HOSPITAL_COMMUNITY): Payer: Medicare Other

## 2013-07-19 ENCOUNTER — Encounter (HOSPITAL_COMMUNITY): Payer: Self-pay | Admitting: Emergency Medicine

## 2013-07-19 ENCOUNTER — Inpatient Hospital Stay (HOSPITAL_COMMUNITY)
Admission: EM | Admit: 2013-07-19 | Discharge: 2013-07-21 | DRG: 196 | Disposition: A | Discharge status: Home / Self Care | Payer: Medicare Other | Attending: Internal Medicine | Admitting: Internal Medicine

## 2013-07-19 DIAGNOSIS — Z6841 Body Mass Index (BMI) 40.0 and over, adult: Secondary | ICD-10-CM | POA: Diagnosis present

## 2013-07-19 DIAGNOSIS — I10 Essential (primary) hypertension: Secondary | ICD-10-CM

## 2013-07-19 DIAGNOSIS — J189 Pneumonia, unspecified organism: Secondary | ICD-10-CM | POA: Diagnosis present

## 2013-07-19 DIAGNOSIS — R0602 Shortness of breath: Secondary | ICD-10-CM | POA: Diagnosis not present

## 2013-07-19 DIAGNOSIS — Z825 Family history of asthma and other chronic lower respiratory diseases: Secondary | ICD-10-CM

## 2013-07-19 DIAGNOSIS — I2729 Other secondary pulmonary hypertension: Secondary | ICD-10-CM

## 2013-07-19 DIAGNOSIS — E1139 Type 2 diabetes mellitus with other diabetic ophthalmic complication: Secondary | ICD-10-CM | POA: Diagnosis present

## 2013-07-19 DIAGNOSIS — I509 Heart failure, unspecified: Secondary | ICD-10-CM | POA: Diagnosis not present

## 2013-07-19 DIAGNOSIS — I2789 Other specified pulmonary heart diseases: Secondary | ICD-10-CM | POA: Diagnosis present

## 2013-07-19 DIAGNOSIS — R059 Cough, unspecified: Secondary | ICD-10-CM | POA: Diagnosis not present

## 2013-07-19 DIAGNOSIS — I428 Other cardiomyopathies: Secondary | ICD-10-CM | POA: Diagnosis present

## 2013-07-19 DIAGNOSIS — J9620 Acute and chronic respiratory failure, unspecified whether with hypoxia or hypercapnia: Secondary | ICD-10-CM | POA: Diagnosis present

## 2013-07-19 DIAGNOSIS — Z800 Family history of malignant neoplasm of digestive organs: Secondary | ICD-10-CM

## 2013-07-19 DIAGNOSIS — E11319 Type 2 diabetes mellitus with unspecified diabetic retinopathy without macular edema: Secondary | ICD-10-CM | POA: Diagnosis present

## 2013-07-19 DIAGNOSIS — R0989 Other specified symptoms and signs involving the circulatory and respiratory systems: Secondary | ICD-10-CM | POA: Diagnosis not present

## 2013-07-19 DIAGNOSIS — Z9851 Tubal ligation status: Secondary | ICD-10-CM | POA: Diagnosis not present

## 2013-07-19 DIAGNOSIS — R0609 Other forms of dyspnea: Secondary | ICD-10-CM | POA: Diagnosis not present

## 2013-07-19 DIAGNOSIS — N039 Chronic nephritic syndrome with unspecified morphologic changes: Secondary | ICD-10-CM

## 2013-07-19 DIAGNOSIS — D869 Sarcoidosis, unspecified: Secondary | ICD-10-CM | POA: Diagnosis not present

## 2013-07-19 DIAGNOSIS — N182 Chronic kidney disease, stage 2 (mild): Secondary | ICD-10-CM | POA: Diagnosis not present

## 2013-07-19 DIAGNOSIS — R062 Wheezing: Secondary | ICD-10-CM

## 2013-07-19 DIAGNOSIS — R05 Cough: Secondary | ICD-10-CM | POA: Diagnosis not present

## 2013-07-19 DIAGNOSIS — J449 Chronic obstructive pulmonary disease, unspecified: Secondary | ICD-10-CM | POA: Diagnosis present

## 2013-07-19 DIAGNOSIS — J9612 Chronic respiratory failure with hypercapnia: Secondary | ICD-10-CM | POA: Diagnosis present

## 2013-07-19 DIAGNOSIS — I5032 Chronic diastolic (congestive) heart failure: Secondary | ICD-10-CM | POA: Diagnosis present

## 2013-07-19 DIAGNOSIS — K219 Gastro-esophageal reflux disease without esophagitis: Secondary | ICD-10-CM | POA: Diagnosis present

## 2013-07-19 DIAGNOSIS — N189 Chronic kidney disease, unspecified: Secondary | ICD-10-CM | POA: Diagnosis present

## 2013-07-19 DIAGNOSIS — D631 Anemia in chronic kidney disease: Secondary | ICD-10-CM | POA: Diagnosis present

## 2013-07-19 DIAGNOSIS — Z833 Family history of diabetes mellitus: Secondary | ICD-10-CM | POA: Diagnosis not present

## 2013-07-19 DIAGNOSIS — Z794 Long term (current) use of insulin: Secondary | ICD-10-CM

## 2013-07-19 DIAGNOSIS — F17200 Nicotine dependence, unspecified, uncomplicated: Secondary | ICD-10-CM | POA: Diagnosis present

## 2013-07-19 DIAGNOSIS — E119 Type 2 diabetes mellitus without complications: Secondary | ICD-10-CM | POA: Diagnosis present

## 2013-07-19 DIAGNOSIS — I129 Hypertensive chronic kidney disease with stage 1 through stage 4 chronic kidney disease, or unspecified chronic kidney disease: Secondary | ICD-10-CM | POA: Diagnosis present

## 2013-07-19 DIAGNOSIS — J4489 Other specified chronic obstructive pulmonary disease: Secondary | ICD-10-CM | POA: Diagnosis present

## 2013-07-19 LAB — CBC WITH DIFFERENTIAL/PLATELET
Basophils Absolute: 0 10*3/uL (ref 0.0–0.1)
Basophils Relative: 0 % (ref 0–1)
Eosinophils Absolute: 0.4 10*3/uL (ref 0.0–0.7)
Eosinophils Relative: 5 % (ref 0–5)
HCT: 28.7 % — ABNORMAL LOW (ref 36.0–46.0)
Hemoglobin: 8.3 g/dL — ABNORMAL LOW (ref 12.0–15.0)
Lymphocytes Relative: 33 % (ref 12–46)
Lymphs Abs: 2.5 10*3/uL (ref 0.7–4.0)
MCH: 24.1 pg — ABNORMAL LOW (ref 26.0–34.0)
MCHC: 28.9 g/dL — ABNORMAL LOW (ref 30.0–36.0)
MCV: 83.2 fL (ref 78.0–100.0)
Monocytes Absolute: 0.7 10*3/uL (ref 0.1–1.0)
Monocytes Relative: 9 % (ref 3–12)
Neutro Abs: 4 10*3/uL (ref 1.7–7.7)
Neutrophils Relative %: 53 % (ref 43–77)
Platelets: 207 10*3/uL (ref 150–400)
RBC: 3.45 MIL/uL — ABNORMAL LOW (ref 3.87–5.11)
RDW: 16.4 % — ABNORMAL HIGH (ref 11.5–15.5)
WBC: 7.6 10*3/uL (ref 4.0–10.5)

## 2013-07-19 LAB — BASIC METABOLIC PANEL
BUN: 30 mg/dL — ABNORMAL HIGH (ref 6–23)
CO2: 34 mEq/L — ABNORMAL HIGH (ref 19–32)
Calcium: 8.7 mg/dL (ref 8.4–10.5)
Chloride: 98 mEq/L (ref 96–112)
Creatinine, Ser: 1.11 mg/dL — ABNORMAL HIGH (ref 0.50–1.10)
GFR calc Af Amer: 69 mL/min — ABNORMAL LOW (ref >90)
GFR calc non Af Amer: 59 mL/min — ABNORMAL LOW (ref >90)
Glucose, Bld: 141 mg/dL — ABNORMAL HIGH (ref 70–99)
Potassium: 4.3 mEq/L (ref 3.7–5.3)
Sodium: 142 mEq/L (ref 137–147)

## 2013-07-19 LAB — I-STAT TROPONIN, ED: Troponin i, poc: 0.01 ng/mL (ref 0.00–0.08)

## 2013-07-19 LAB — CBG MONITORING, ED: Glucose-Capillary: 147 mg/dL — ABNORMAL HIGH (ref 70–99)

## 2013-07-19 MED ORDER — AZITHROMYCIN 250 MG PO TABS
500.0000 mg | ORAL_TABLET | Freq: Once | ORAL | Status: AC
  Administered 2013-07-19: 500 mg via ORAL
  Filled 2013-07-19: qty 2

## 2013-07-19 MED ORDER — DEXTROSE 5 % IV SOLN
1.0000 g | Freq: Once | INTRAVENOUS | Status: AC
  Administered 2013-07-19: 1 g via INTRAVENOUS
  Filled 2013-07-19: qty 10

## 2013-07-19 MED ORDER — IPRATROPIUM BROMIDE 0.02 % IN SOLN
0.5000 mg | Freq: Once | RESPIRATORY_TRACT | Status: AC
  Administered 2013-07-19: 0.5 mg via RESPIRATORY_TRACT
  Filled 2013-07-19: qty 2.5

## 2013-07-19 MED ORDER — ALBUTEROL SULFATE (2.5 MG/3ML) 0.083% IN NEBU
5.0000 mg | INHALATION_SOLUTION | Freq: Once | RESPIRATORY_TRACT | Status: AC
  Administered 2013-07-19: 5 mg via RESPIRATORY_TRACT
  Filled 2013-07-19: qty 6

## 2013-07-19 MED ORDER — METHYLPREDNISOLONE SODIUM SUCC 125 MG IJ SOLR
125.0000 mg | Freq: Once | INTRAMUSCULAR | Status: AC
  Administered 2013-07-19: 125 mg via INTRAVENOUS
  Filled 2013-07-19: qty 2

## 2013-07-19 NOTE — ED Provider Notes (Signed)
  Face-to-face evaluation   History: She complains of worsening shortness of breath for several days. It is tight feeling in her chest. She uses home oxygen, but has had to use more than usual.  Physical exam: Alert, cooperative. No respiratory distress.  Medical screening examination/treatment/procedure(s) were conducted as a shared visit with non-physician practitioner(s) and myself.  I personally evaluated the patient during the encounter  Richarda Blade, MD 07/20/13 [phone]

## 2013-07-19 NOTE — H&P (Signed)
Date: 07/19/2013               Patient Name:  Melody Gray MRN: [id number]  DOB: [birth year] Age / Sex: 43 y.o., female   PCP: Jerene Pitch, MD         Medical Service: Internal Medicine Teaching Service         Attending Physician: Dr. Richarda Blade, MD    First Contact: Dr. Lesly Dukes Pager: [phone]  Second Contact: Dr. Adele Barthel Pager: [phone]       After Hours (After 5p/  First Contact Pager: [phone]  weekends / holidays): Second Contact Pager: [phone]   Chief Complaint: DOE  History of Present Illness: Melody Gray is a 43 year old woman with a PMH of sarcoidosis (on 2L home oxygen), pulmonary HTN, obstructive lung disease, diastolic HF (EF [id number] Nov 2013), CKD, anemia of ckd, HTN and DM type 2 (Hgb A1c 6.05 May 2013) and GERD who presents with c/o increased DOE x 1 week.  She follows with Dr. Melvyn Novas for her pulmonary disease and has not required steroid treatment since Oct 2013.  She thought she was coming down with a cold and that it was contributing to her dyspnea, however her symptoms are not improving.  She normally uses 2L of oxygen when she is at home.  She usually can go out shopping without her oxygen.  For the past week she has had to increase from 2L --> 4L.  Associated symptoms include central chest and upper abdomen tightness that she says feels like indigestion.  She denies radiation of the pain into arm/jaw, N/V or diaphoresis.  She notes the pain when she has the dyspnea, which is on exertion.  She reports subjective fevers and has a dry cough but denies mucous production.  Regarding her HF, she reports compliance with medications, including Lasix and has not noticed an increase in swelling.  She reports orthopnea and PND that is unchanged.   She is smoking 1 pack of cigarettes every 3 days.  In the ED:  T 98.10F, RR 22, SpO2 100% on 4L via [place], HR 83, BP 131/64mmHg; she received 500mg  azithromycin, 1g Rocephin, 125mg  Solu-Medrol and duoneb.  Meds: Current  Facility-Administered Medications  Medication Dose Route Frequency Provider Last Rate Last Dose  . cefTRIAXone (ROCEPHIN) 1 g in dextrose 5 % 50 mL IVPB  1 g Intravenous Once Carlisle Cater, PA-C 100 mL/hr at 07/19/13 2305 1 g at 07/19/13 2305   Current Outpatient Prescriptions  Medication Sig Dispense Refill  . BENICAR 40 MG tablet take 1/2 tablet by mouth once daily  30 tablet  11  . carvedilol (COREG) 25 MG tablet take 1 tablet by mouth twice a day with food  60 tablet  0  . furosemide (LASIX) 40 MG tablet take 1 tablet by mouth once daily  90 tablet  2  . insulin aspart protamine- aspart (NOVOLOG MIX 70/30) (70-30) 100 UNIT/ML injection Inject 0.25 mLs (25 Units total) into the skin 2 (two) times daily with a meal. Taking 25 in AM and 23 in PM  10 mL  3  . metFORMIN (GLUCOPHAGE) 1000 MG tablet Take 1 tablet (1,000 mg total) by mouth 2 (two) times daily with a meal.  180 tablet  4  . omeprazole (PRILOSEC) 40 MG capsule take 1 capsule by mouth once daily  90 capsule  5  . Phenyleph-CPM-DM-Aspirin (ALKA-SELTZER PLUS COLD & COUGH) 7.11-30-08-325 MG TBEF Take 1 capsule by mouth daily as needed (  for cold).        Allergies: Allergies as of 07/19/2013 - Review Complete 07/19/2013  Allergen Reaction Noted  . Vicodin [hydrocodone-acetaminophen] Itching 11/17/2010   Past Medical History  Diagnosis Date  . Sarcoidosis     Followed by Dr. Melvyn Novas; w/ liver involvement per biopsy 12/09, Reversible airway component so started on [facility] 01/2010; HFA 75% p coaching 05/2010  . Hypoxemia     CT angiogram 9/11>> No PE; PFTs 10/11- FEV1 1.20 (49%) with 16% better p B2, DLCO 33%> corrects to 84; O2 sats ok on 4 lpm X rapid walk X 3 laps 05/2010  . Morbid obesity     Target wt= 153 for BMI <30  . QT prolongation   . Diabetes mellitus   . Hypertension   . Hx of cardiac cath 2/08    No CAD, no RAS,  normal EF  . Seborrheic dermatitis of scalp   . Abnormal LFTs (liver function tests)     Liver U/S and exam c/w  HSM. Hep B serology neg. but Hep C ab +, HIV neg. 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 Cardiomyopathy, nonischemic     EF 45% 12/10; Echo 7/11 normal EF, PAS 48  . Diabetic retinopathy     Right eye 2/11  . Health maintenance examination     Mammogram 05/2010 Negative; Last Pap smear 03/2008; Last DM eye exam 2/11> mild non-proliferative diabetic retinopathy. OD  . Helicobacter pylori ab+ 05/2011    Pt was symptomatic and treatment planned for 05/2011   Past Surgical History  Procedure Laterality Date  . Tubal ligation    . Breast surgery    . Cesarean section     Family History  Problem Relation Age of Onset  . Cancer Mother     colon cancer  . Multiple sclerosis Father   . Asthma Sister     in childhood  . Diabetes Father   . Hypertension     History   Social History  . Marital Status: Single    Spouse Name: N/A    Number of Children: 2  . Years of Education: N/A   Occupational History  . 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 Smoking status: Current Every Day Smoker -- 0.50 packs/day for 20 years    Types: Cigarettes  . Smokeless tobacco: Never Used     Comment: Needs to cut back.  . Alcohol Use: No  . Drug Use: No  . Sexual Activity: Not on file   Other Topics Concern  . Not on file   Social History Narrative   Diabetic card given 05/03/2010.   Financial assistance approved for 100% discount at [facility] and has [facility] card. [name] 12/07/2009.      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    Review of Systems: Pertinent items are noted in HPI. HEENT:  Denies sore throat, sneezing, + dry cough Cardiopulm:  See HPI GI:  Appetite normal, denies N/V or diarrhea GU:  Denies dysuria  Physical Exam: Blood pressure 131/81, pulse 77, temperature 98.5 F (36.9 C), temperature source Oral, resp. rate 16, weight 234 lb (106.142 kg), last menstrual period  07/10/2013, SpO2 100.00%. General: resting in bed in NAD, she is able to speak in complete sentences HEENT: PERRL, EOMI, oropharynx clear and moist, no JVD Cardiac: RRR, no rubs, murmurs  or gallops, + central chest TTP Pulm: diffuse wheezing, no rales, no respiratory distress Abd: soft, nondistended, BS present, + epigastric TTP Ext: warm and well perfused, 1+ lower extremity edema, 2 DPs Neuro: alert and oriented X3, cranial nerves II-XII grossly intact, 5/5 MMS upper and lower extremities  Lab results: Basic Metabolic Panel:  Recent Labs  07/19/13 1900  NA 142  K 4.3  CL 98  CO2 34*  GLUCOSE 141*  BUN 30*  CREATININE 1.11*  CALCIUM 8.7   CBC:  Recent Labs  07/19/13 1900  WBC 7.6  NEUTROABS 4.0  HGB 8.3*  HCT 28.7*  MCV 83.2  PLT 207   Cardiac Enzymes:  Troponin negative x 1  CBG:  Recent Labs  07/19/13 1846  GLUCAP 147*   Imaging results:  Dg Chest 2 View  07/19/2013   CLINICAL DATA:  Shortness of breath, cold, cough  EXAM: CHEST  2 VIEW  COMPARISON:  04/08/2013  FINDINGS: Mild patchy right upper lobe opacity, suspicious for pneumonia.  Additional mild patchy bilateral lower lobe opacities, possibly atelectasis. No pleural effusion or pneumothorax.  Mild cardiomegaly.  Mild degenerative changes of the visualized thoracolumbar spine.  IMPRESSION: Mild patchy right upper lobe opacity, suspicious for pneumonia.  Additional mild patchy bilateral lower lobe opacities, possibly atelectasis.   Electronically Signed   By: Julian Hy M.D.   On: 07/19/2013 20:13    Other results: EKG:  NSR, 84 bpm, low voltage.  Assessment & Plan by Problem:  Dyspnea and chest pain 2/2 to CAP and Sarcoidosis:  She felt she was getting a cold, with subjective fever, dry cough and increasing DOE x 1 week.  CXR findings concerning for pneumonia and she has a sick contact, a sister who had similar symptoms.  However, no fever or leukocytosis.  She describes associated CP that "feels  like indigestion."  Doubt cardiac etiology of chest pain since it is reproducible on physical exam (along with epigastric tenderness), troponin negative and she had no evidence of CAD on cardiac cath in Sept 2012.  PE very unlikely given Well's score of 0.  Chest pain, cough and dyspnea are common symptoms of Sarcoidosis, though pneumonia is likely contributing as well.  The patient's feeling of indigestion and epigastric tenderness also support a GI component, such as GERD, for which she takes Prilosec at home. - admit to med/surg on telemetry - droplet isolation - supplemental oxygen prn - ceftriaxone and azithromycin  - Prednisone 40mg  daily - duonebs q6h - Protonix 40mg  BID  - urine legionella and strep, flu panel  Sarcoidosis:  The patient is followed by Dr. Melvyn Novas.  She has been treated with steroids in the past but has not used them in over a year.  PFTs in July 2013 are consistent with mild restrictive lung defect.  Dyspnea, chest pain and cough are also consistent with Sarcoidosis. Per Pulmonology note 3 months ago, the patient's symptoms (including DOE at that time) seemed 2/2 to HF and obesity (10/2011 PFTs with ERV 39% c/w effects of obesity). - Prednisone 40mg  daily - duonebs q6h  Obstructive lung disease/questionable COPD:  The patient is a smoker and has PFTs consistent with mild obstructive (and mild restrictive) lung defect.  She is wheezing on exam but denies mucous production making COPD less likely.  Wheezing could also be 2/2 to sarcoidosis and endobronchial sarcoidosis can given an obstructive pattern. - supplemental oxygen prn - duonebs  Chronic diastolic heart failure:  ECHO in Nov 2013 - EF 45-50%  with grade 1 diastolic dysfunction.  No evidence of acute exacerbation - mild lower extremity edema, wt unchanged, PND and orthopnea stable.  DOE seems 2/2 to sarcoid and acute infection (CAP).  However, would recommend repeat ECHO. - continue Lasix 40mg  daily, Coreg 25mg  BID -  repeat ECHO  CKD2:  GFR 69 at admission.  Most recent GFR > 90 in Oct 2014, but has been as low as 31 in the past.  Baseline Cr 0.6-0.8. Cr 1.11 at admission. - monitor renal function  Anemia of CKD:  Baseline Hgb 8.6-9.8.  Hgb 8.3 on admission.  No s/s of bleeding. - monitor CBC  DM type 2, controlled:  Stable.  Home medications:  70/30 Novolog 25 units qAM and 23 units qPM, 1000mg  metformin BID. - holding metformin during admission - 25 units 70/30 Novolog BID - SSI sensitive  HTN:  Stable. Home medications are Coreg 25mg  BID and olmesartan 20mg  daily. - Coreg 25mg  BID - irbesartan 150mg    Diet:  Carb modified  VTE ppx:  heparin  Dispo: Disposition is deferred at this time, awaiting improvement of current medical problems. Anticipated discharge in approximately 1-2 day(s).   The patient does have a current PCP Jerene Pitch, MD) and does need an [facility] hospital follow-up appointment after discharge.  The patient does not know have transportation limitations that hinder transportation to clinic appointments.  Signed: Duwaine Maxin, DO 07/19/2013, 11:13 PM

## 2013-07-19 NOTE — ED Provider Notes (Signed)
CSN: [id number]     Arrival date & time 07/19/13  1836 History   First MD Initiated Contact with Patient 07/19/13 2114     Chief Complaint  Patient presents with  . Hyperglycemia  . Shortness of Breath   (Consider location/radiation/quality/duration/timing/severity/associated sxs/prior Treatment)  HPI Comments: Patients with history of sarcoidosis presents with complaint of increasing shortness of breath over the past week. She is concerned that this is a flare of her sarcoidosis. Patient has not had fever or worsening cough. She has had some minimal "indigestion" type feeling in lower chest. No cardiac history. Patient is on 2L home O2 however she has increased this to 4L. Her shortness of breath is much worse with exertion. She reports decreased activity at home due to shortness of breath. No lower extremity swelling. The onset of this condition was acute. The course is constant. Aggravating factors: none. Alleviating factors: none.    Patient is a 43 y.o. female presenting with hyperglycemia and shortness of breath. The history is provided by the patient and medical records.  Hyperglycemia Associated symptoms: chest pain and shortness of breath   Associated symptoms: no abdominal pain, no dysuria, no fever, no nausea and no vomiting   Shortness of Breath Associated symptoms: chest pain   Associated symptoms: no abdominal pain, no cough, no fever, no headaches, no rash, no sore throat and no vomiting     Past Medical History  Diagnosis Date  . Sarcoidosis     Followed by Dr. Melvyn Novas; w/ liver involvement per biopsy 12/09, Reversible airway component so started on [facility] 01/2010; HFA 75% p coaching 05/2010  . Hypoxemia     CT angiogram 9/11>> No PE; PFTs 10/11- FEV1 1.20 (49%) with 16% better p B2, DLCO 33%> corrects to 84; O2 sats ok on 4 lpm X rapid walk X 3 laps 05/2010  . Morbid obesity     Target wt= 153 for BMI <30  . QT prolongation   . Diabetes mellitus   . Hypertension   . Hx of  cardiac cath 2/08    No CAD, no RAS,  normal EF  . Seborrheic dermatitis of scalp   . 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 Hep B serology neg. but Hep C ab +, HIV neg. 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 Cardiomyopathy, nonischemic     EF 45% 12/10; Echo 7/11 normal EF, PAS 48  . Diabetic retinopathy     Right eye 2/11  . Health maintenance examination     Mammogram 05/2010 Negative; Last Pap smear 03/2008; Last DM eye exam 2/11> mild non-proliferative diabetic retinopathy. OD  . Helicobacter pylori ab+ 05/2011    Pt was symptomatic and treatment planned for 05/2011   Past Surgical History  Procedure Laterality Date  . Tubal ligation    . Breast surgery    . Cesarean section     Family History  Problem Relation Age of Onset  . Cancer Mother     colon cancer  . Multiple sclerosis Father   . Asthma Sister     in childhood  . Diabetes Father   . Hypertension     History  Substance Use Topics  . Smoking status: Current Every Day Smoker -- 0.50 packs/day for 20 years    Types: Cigarettes  . Smokeless tobacco: Never Used     Comment: Needs to cut back.  Marland Kitchen  Alcohol Use: No   OB History   Grav Para Term Preterm Abortions TAB SAB Ect Mult Living                 Review of Systems  Constitutional: Negative for fever.  HENT: Negative for rhinorrhea and sore throat.   Eyes: Negative for redness.  Respiratory: Positive for shortness of breath. Negative for cough.   Cardiovascular: Positive for chest pain. Negative for leg swelling.  Gastrointestinal: Negative for nausea, vomiting, abdominal pain and diarrhea.  Genitourinary: Negative for dysuria.  Musculoskeletal: Negative for myalgias.  Skin: Negative for rash.  Neurological: Negative for headaches.    Allergies  Vicodin  Home Medications   Current Outpatient Rx  Name  Route  Sig  Dispense  Refill  . BENICAR 40 MG tablet      take  1/2 tablet by mouth once daily   30 tablet   11   . carvedilol (COREG) 25 MG tablet      take 1 tablet by mouth twice a day with food   60 tablet   0     **PATIENT NEEDS APPOINTMENT**   . furosemide (LASIX) 40 MG tablet      take 1 tablet by mouth once daily   90 tablet   2   . insulin aspart protamine- aspart (NOVOLOG MIX 70/30) (70-30) 100 UNIT/ML injection   Subcutaneous   Inject 0.25 mLs (25 Units total) into the skin 2 (two) times daily with a meal. Taking 25 in AM and 23 in PM   10 mL   3   . metFORMIN (GLUCOPHAGE) 1000 MG tablet   Oral   Take 1 tablet (1,000 mg total) by mouth 2 (two) times daily with a meal.   180 tablet   4   . omeprazole (PRILOSEC) 40 MG capsule      take 1 capsule by mouth once daily   90 capsule   5   . Phenyleph-CPM-DM-Aspirin (ALKA-SELTZER PLUS COLD & COUGH) 7.11-30-08-325 MG TBEF   Oral   Take 1 capsule by mouth daily as needed (for cold).          BP 143/93  Pulse 79  Temp(Src) 98.5 F (36.9 C) (Oral)  Resp 15  Wt 234 lb (106.142 kg)  SpO2 98%  LMP 07/10/2013  Physical Exam  Nursing note and vitals reviewed. Constitutional: She appears well-developed and well-nourished.  HENT:  Head: Normocephalic and atraumatic.  Mouth/Throat: Oropharynx is clear and moist.  Eyes: Conjunctivae are normal. Right eye exhibits no discharge. Left eye exhibits no discharge.  Neck: Normal range of motion. Neck supple.  Cardiovascular: Normal rate, regular rhythm and normal heart sounds.   No murmur heard. Pulmonary/Chest: Effort normal and breath sounds normal. No respiratory distress. She has no wheezes. She has no rales.  Abdominal: Soft. There is no tenderness. There is no rebound and no guarding.  Neurological: She is alert.  Skin: Skin is warm and dry.  Psychiatric: She has a normal mood and affect.    ED Course  Procedures (including critical care time) Labs Review Labs Reviewed  CBC WITH DIFFERENTIAL - Abnormal; Notable for the  following:    RBC 3.45 (*)    Hemoglobin 8.3 (*)    HCT 28.7 (*)    MCH 24.1 (*)    MCHC 28.9 (*)    RDW 16.4 (*)    All other components within normal limits  BASIC METABOLIC PANEL - Abnormal; Notable for the following:  CO2 34 (*)    Glucose, Bld 141 (*)    BUN 30 (*)    Creatinine, Ser 1.11 (*)    GFR calc non Af Amer 59 (*)    GFR calc Af Amer 69 (*)    All other components within normal limits  CBG MONITORING, ED - Abnormal; Notable for the following:    Glucose-Capillary 147 (*)    All other components within normal limits  I-STAT TROPOININ, ED   Imaging Review Dg Chest 2 View  07/19/2013   CLINICAL DATA:  Shortness of breath, cold, cough  EXAM: CHEST  2 VIEW  COMPARISON:  04/08/2013  FINDINGS: Mild patchy right upper lobe opacity, suspicious for pneumonia.  Additional mild patchy bilateral lower lobe opacities, possibly atelectasis. No pleural effusion or pneumothorax.  Mild cardiomegaly.  Mild degenerative changes of the visualized thoracolumbar spine.  IMPRESSION: Mild patchy right upper lobe opacity, suspicious for pneumonia.  Additional mild patchy bilateral lower lobe opacities, possibly atelectasis.   Electronically Signed   By: Julian Hy M.D.   On: 07/19/2013 20:13     EKG Interpretation None      9:41 PM Patient seen and examined. CXR concerning for PNA. Work-up reviewed. Medications ordered. EKG shows new lateral t-wave inversion. Troponin added. Will ambulate and check pulse ox.  Vital signs reviewed and are as follows: Filed Vitals:   07/19/13 2130  BP: 117/68  Pulse: 85  Temp:   Resp: 15   10:41 PM Patient desats into 70's with ambulation on her home O2 setting. Will ask IMTS to admit. D/w Dr. Eulis Foster.   10:54 PM IMTS to see.   MDM   Final diagnoses:  Sarcoidosis  Shortness of breath  Community acquired pneumonia   Admit for above.     Carlisle Cater, PA-C 07/19/13 2254

## 2013-07-19 NOTE — ED Notes (Signed)
Pt presents to department for evaluation of hyperglycemia, increasing SOB and "tightness" feeling all over body. History of Sarcoidosis. States she normally wears 2L O2, increased to 4L O2 today for worsening SOB. States pain all over body. Pt is conscious alert and oriented x4. Speaking complete sentences at the time.

## 2013-07-19 NOTE — ED Notes (Signed)
Internal MD at bedside.

## 2013-07-20 DIAGNOSIS — J189 Pneumonia, unspecified organism: Secondary | ICD-10-CM | POA: Diagnosis present

## 2013-07-20 LAB — BASIC METABOLIC PANEL
BUN: 29 mg/dL — ABNORMAL HIGH (ref 6–23)
BUN: 27 mg/dL — ABNORMAL HIGH (ref 6–23)
CO2: 31 mEq/L (ref 19–32)
CO2: 34 mEq/L — ABNORMAL HIGH (ref 19–32)
Calcium: 8.6 mg/dL (ref 8.4–10.5)
Calcium: 8.9 mg/dL (ref 8.4–10.5)
Chloride: 96 mEq/L (ref 96–112)
Chloride: 97 mEq/L (ref 96–112)
Creatinine, Ser: 1.15 mg/dL — ABNORMAL HIGH (ref 0.50–1.10)
Creatinine, Ser: 1.06 mg/dL (ref 0.50–1.10)
GFR calc Af Amer: 66 mL/min — ABNORMAL LOW (ref >90)
GFR calc Af Amer: 73 mL/min — ABNORMAL LOW (ref >90)
GFR calc non Af Amer: 57 mL/min — ABNORMAL LOW (ref >90)
GFR calc non Af Amer: 63 mL/min — ABNORMAL LOW (ref >90)
Glucose, Bld: 269 mg/dL — ABNORMAL HIGH (ref 70–99)
Glucose, Bld: 265 mg/dL — ABNORMAL HIGH (ref 70–99)
Potassium: 5.4 mEq/L — ABNORMAL HIGH (ref 3.7–5.3)
Potassium: 5.5 mEq/L — ABNORMAL HIGH (ref 3.7–5.3)
Sodium: 139 mEq/L (ref 137–147)
Sodium: 141 mEq/L (ref 137–147)

## 2013-07-20 LAB — INFLUENZA PANEL BY PCR (TYPE A & B)
H1N1 flu by pcr: NOT DETECTED
Influenza A By PCR: NEGATIVE
Influenza B By PCR: NEGATIVE

## 2013-07-20 LAB — GLUCOSE, CAPILLARY
Glucose-Capillary: 300 mg/dL — ABNORMAL HIGH (ref 70–99)
Glucose-Capillary: 308 mg/dL — ABNORMAL HIGH (ref 70–99)
Glucose-Capillary: 313 mg/dL — ABNORMAL HIGH (ref 70–99)
Glucose-Capillary: 287 mg/dL — ABNORMAL HIGH (ref 70–99)

## 2013-07-20 LAB — STREP PNEUMONIAE URINARY ANTIGEN: Strep Pneumo Urinary Antigen: NEGATIVE

## 2013-07-20 MED ORDER — SODIUM CHLORIDE 0.9 % IV SOLN: 250.0000 mL | INTRAVENOUS | Status: DC | PRN

## 2013-07-20 MED ORDER — IRBESARTAN 300 MG PO TABS
300.0000 mg | ORAL_TABLET | Freq: Every day | ORAL | Status: DC
  Filled 2013-07-20: qty 1

## 2013-07-20 MED ORDER — AZITHROMYCIN 500 MG PO TABS
500.0000 mg | ORAL_TABLET | ORAL | Status: DC
  Administered 2013-07-20: 500 mg via ORAL
  Filled 2013-07-20 (×3): qty 1

## 2013-07-20 MED ORDER — PREDNISONE 20 MG PO TABS
40.0000 mg | ORAL_TABLET | Freq: Every day | ORAL | Status: DC
  Administered 2013-07-20 – 2013-07-21 (×2): 40 mg via ORAL
  Filled 2013-07-20 (×3): qty 2

## 2013-07-20 MED ORDER — INSULIN ASPART 100 UNIT/ML ~~LOC~~ SOLN
0.0000 [IU] | Freq: Three times a day (TID) | SUBCUTANEOUS | Status: DC
  Administered 2013-07-20: 5 [IU] via SUBCUTANEOUS
  Administered 2013-07-20 (×2): 7 [IU] via SUBCUTANEOUS
  Administered 2013-07-21: 3 [IU] via SUBCUTANEOUS
  Administered 2013-07-21: 7 [IU] via SUBCUTANEOUS

## 2013-07-20 MED ORDER — DEXTROSE 5 % IV SOLN
1.0000 g | INTRAVENOUS | Status: DC
  Administered 2013-07-20: 1 g via INTRAVENOUS
  Filled 2013-07-20 (×2): qty 10

## 2013-07-20 MED ORDER — PANTOPRAZOLE SODIUM 40 MG PO TBEC
40.0000 mg | DELAYED_RELEASE_TABLET | Freq: Two times a day (BID) | ORAL | Status: DC
  Administered 2013-07-20 (×2): 40 mg via ORAL
  Filled 2013-07-20 (×3): qty 1

## 2013-07-20 MED ORDER — CARVEDILOL 25 MG PO TABS
25.0000 mg | ORAL_TABLET | Freq: Two times a day (BID) | ORAL | Status: DC
  Administered 2013-07-20 – 2013-07-21 (×3): 25 mg via ORAL
  Filled 2013-07-20 (×5): qty 1

## 2013-07-20 MED ORDER — SODIUM CHLORIDE 0.9 % IJ SOLN
3.0000 mL | Freq: Two times a day (BID) | INTRAMUSCULAR | Status: DC
  Administered 2013-07-20: 3 mL via INTRAVENOUS

## 2013-07-20 MED ORDER — FUROSEMIDE 40 MG PO TABS
40.0000 mg | ORAL_TABLET | Freq: Every day | ORAL | Status: DC
  Administered 2013-07-20 – 2013-07-21 (×2): 40 mg via ORAL
  Filled 2013-07-20 (×2): qty 1

## 2013-07-20 MED ORDER — IPRATROPIUM BROMIDE 0.02 % IN SOLN: 0.5000 mg | Freq: Four times a day (QID) | RESPIRATORY_TRACT | Status: DC

## 2013-07-20 MED ORDER — HEPARIN SODIUM (PORCINE) 5000 UNIT/ML IJ SOLN
5000.0000 [IU] | Freq: Three times a day (TID) | INTRAMUSCULAR | Status: DC
  Administered 2013-07-20 – 2013-07-21 (×4): 5000 [IU] via SUBCUTANEOUS
  Filled 2013-07-20 (×7): qty 1

## 2013-07-20 MED ORDER — SODIUM CHLORIDE 0.9 % IJ SOLN
3.0000 mL | Freq: Two times a day (BID) | INTRAMUSCULAR | Status: DC
  Administered 2013-07-20 – 2013-07-21 (×2): 3 mL via INTRAVENOUS

## 2013-07-20 MED ORDER — IRBESARTAN 150 MG PO TABS
150.0000 mg | ORAL_TABLET | Freq: Every day | ORAL | Status: DC
  Filled 2013-07-20: qty 1

## 2013-07-20 MED ORDER — SODIUM CHLORIDE 0.9 % IJ SOLN: 3.0000 mL | INTRAMUSCULAR | Status: DC | PRN

## 2013-07-20 MED ORDER — ALBUTEROL SULFATE (2.5 MG/3ML) 0.083% IN NEBU
2.5000 mg | INHALATION_SOLUTION | Freq: Four times a day (QID) | RESPIRATORY_TRACT | Status: DC
Start: 2013-07-20 — End: 2013-07-20

## 2013-07-20 MED ORDER — IPRATROPIUM-ALBUTEROL 0.5-2.5 (3) MG/3ML IN SOLN
3.0000 mL | Freq: Four times a day (QID) | RESPIRATORY_TRACT | Status: DC
  Administered 2013-07-20 – 2013-07-21 (×6): 3 mL via RESPIRATORY_TRACT
  Filled 2013-07-20 (×5): qty 3

## 2013-07-20 MED ORDER — ALBUTEROL SULFATE (2.5 MG/3ML) 0.083% IN NEBU: 2.5000 mg | INHALATION_SOLUTION | RESPIRATORY_TRACT | Status: DC | PRN

## 2013-07-20 MED ORDER — INSULIN ASPART PROT & ASPART (70-30 MIX) 100 UNIT/ML ~~LOC~~ SUSP
25.0000 [IU] | Freq: Two times a day (BID) | SUBCUTANEOUS | Status: DC
  Administered 2013-07-20 – 2013-07-21 (×3): 25 [IU] via SUBCUTANEOUS
  Filled 2013-07-20: qty 10

## 2013-07-20 NOTE — ED Provider Notes (Signed)
Medical screening examination/treatment/procedure(s) were performed by non-physician practitioner and as supervising physician I was immediately available for consultation/collaboration.  Richarda Blade, MD 07/20/13 [phone]

## 2013-07-20 NOTE — Progress Notes (Signed)
Subjective: Patient seen and examined at the bedside this morning. Very pleasant. She feels well when she is lying in bed, but notes her dyspnea returns with even short walks to the bathroom. This has been such a problem at home that she hasn't left the house in 2 weeks. She denies chest pain, abdominal pain. Denies rash.  Objective: Vital signs in last 24 hours: Filed Vitals:   07/20/13 0000 07/20/13 0054 07/20/13 0114 07/20/13 0444  BP: 133/79 111/63 123/79 138/78  Pulse: 89 89 68 73  Temp:  98.9 F (37.2 C) 97.8 F (36.6 C) 97.6 F (36.4 C)  TempSrc:  Oral Oral Oral  Resp: 18 20 19 21   Height:   5' (1.524 m)   Weight:   239 lb 4.8 oz (108.546 kg)   SpO2: 91% 92% 97% 100%   Weight change:   Intake/Output Summary (Last 24 hours) at 07/20/13 1157 Last data filed at 07/20/13 1004  Gross per 24 hour  Intake    240 ml  Output      0 ml  Net    240 ml   Physical Exam:  General: resting in bed in NAD, she is able to speak in complete sentences, very pleasant HEENT: PERRL, EOMI, oropharynx clear and moist, no JVD  Cardiac: RRR, no rubs, murmurs or gallops Pulm: no wheezing, soft velco-like crackles in right upper lung field, no respiratory distress  Abd: soft, nontender, nondistended, BS present Ext: warm and well perfused, trace lower extremity edema Neuro: alert and oriented X3, cranial nerves II-XII grossly intact, 5/5 MMS upper and lower extremities  Lab Results: Basic Metabolic Panel:  Recent Labs Lab 07/20/13 0455 07/20/13 0854  NA 139 141  K 5.4* 5.5*  CL 96 97  CO2 31 34*  GLUCOSE 269* 265*  BUN 29* 27*  CREATININE 1.15* 1.06  CALCIUM 8.6 8.9   Liver Function Tests: No results found for this basename: AST, ALT, ALKPHOS, BILITOT, PROT, ALBUMIN,  in the last 168 hours No results found for this basename: LIPASE, AMYLASE,  in the last 168 hours No results found for this basename: AMMONIA,  in the last 168 hours CBC:  Recent Labs Lab 07/19/13 1900  WBC  7.6  NEUTROABS 4.0  HGB 8.3*  HCT 28.7*  MCV 83.2  PLT 207   Cardiac Enzymes: No results found for this basename: CKTOTAL, CKMB, CKMBINDEX, TROPONINI,  in the last 168 hours BNP: No results found for this basename: PROBNP,  in the last 168 hours D-Dimer: No results found for this basename: DDIMER,  in the last 168 hours CBG:  Recent Labs Lab 07/19/13 1846 07/20/13 0639  GLUCAP 147* 300*   Hemoglobin A1C: No results found for this basename: HGBA1C,  in the last 168 hours Fasting Lipid Panel: No results found for this basename: CHOL, HDL, LDLCALC, TRIG, CHOLHDL, LDLDIRECT,  in the last 168 hours Thyroid Function Tests: No results found for this basename: TSH, T4TOTAL, FREET4, T3FREE, THYROIDAB,  in the last 168 hours Coagulation: No results found for this basename: LABPROT, INR,  in the last 168 hours Anemia Panel: No results found for this basename: VITAMINB12, FOLATE, FERRITIN, TIBC, IRON, RETICCTPCT,  in the last 168 hours Urine Drug Screen: Drugs of Abuse  No results found for this basename: labopia, cocainscrnur, labbenz, amphetmu, thcu, labbarb    Alcohol Level: No results found for this basename: ETH,  in the last 168 hours Urinalysis: No results found for this basename: COLORURINE, APPERANCEUR, LABSPEC, [place], GLUCOSEU,  HGBUR, BILIRUBINUR, KETONESUR, PROTEINUR, UROBILINOGEN, NITRITE, LEUKOCYTESUR,  in the last 168 hours   Micro Results: No results found for this or any previous visit (from the past 240 hour(s)). Studies/Results: Dg Chest 2 View  07/19/2013   CLINICAL DATA:  Shortness of breath, cold, cough  EXAM: CHEST  2 VIEW  COMPARISON:  04/08/2013  FINDINGS: Mild patchy right upper lobe opacity, suspicious for pneumonia.  Additional mild patchy bilateral lower lobe opacities, possibly atelectasis. No pleural effusion or pneumothorax.  Mild cardiomegaly.  Mild degenerative changes of the visualized thoracolumbar spine.  IMPRESSION: Mild patchy right upper lobe  opacity, suspicious for pneumonia.  Additional mild patchy bilateral lower lobe opacities, possibly atelectasis.   Electronically Signed   By: Julian Hy M.D.   On: 07/19/2013 20:13   Medications: I have reviewed the patient's current medications. Scheduled Meds: . azithromycin  500 mg Oral Q24H  . carvedilol  25 mg Oral BID WC  . cefTRIAXone (ROCEPHIN)  IV  1 g Intravenous Q24H  . furosemide  40 mg Oral Daily  . heparin  5,000 Units Subcutaneous 3 times per day  . insulin aspart  0-9 Units Subcutaneous TID WC  . insulin aspart protamine- aspart  25 Units Subcutaneous BID WC  . ipratropium-albuterol  3 mL Nebulization Q6H  . pantoprazole  40 mg Oral BID  . predniSONE  40 mg Oral Q breakfast  . sodium chloride  3 mL Intravenous Q12H  . sodium chloride  3 mL Intravenous Q12H   Continuous Infusions:  PRN Meds:.sodium chloride, albuterol, sodium chloride  Assessment/Plan:  #Dyspnea on exertion, likely 2/2 to CAP and Sarcoidosis: Main issue is dyspnea on exertion. She feels well at rest. XR show mild patchy right upper lobe opacity, suspicious for pneumonia, which correlates with crackles on exam. Strep pneumo antigen negative. Flu panel negative. PFTs 07/13 suggested mixed obstructive/restrictive disease, but her pulmonologist does not feel she has COPD. (Per Dr. Gustavus Bryant note 3 months ago, the patient's symptoms including DOE at that time seemed 2/2 to HF and obesity.) No wheezing noted on exam today. History of sarcoidosis with last flare in 2013. Will treat for both. - Ceftriaxone and azithromycin IV, may convert to po tomorrow if clinical improvement - Prednisone 40mg  daily, she is s/p Solu-Medrol 125 IV in ED - Duonebs q6h  - Protonix 40mg  BID  - Telemetry - Supplemental oxygen prn  - BMP, CBC in am - PT eval and treat  Chronic diastolic heart failure: ECHO in Nov 2013 - EF 45-50% with grade 1 diastolic dysfunction. No evidence of acute exacerbation - trace lower extremity  edema, wt unchanged, PND and orthopnea stable. DOE seems 2/2 to sarcoid and acute infection (CAP). No need for repeat echo. - continue Lasix 40mg  daily, Coreg 25mg  BID   CKD2: Baseline Cr 0.6-0.8. Cr 1.11 at admission. Downtrending this am. - monitor renal function   Creatinine, Ser  Date Value Ref Range Status  07/20/2013 1.06  0.50 - 1.10 mg/dL Final  07/20/2013 1.15* 0.50 - 1.10 mg/dL Final  07/19/2013 1.11* 0.50 - 1.10 mg/dL Final    Anemia of CKD: Baseline Hgb 8.6-9.8. Hgb 8.3 on admission. No s/s of bleeding.  - monitor CBC   Hemoglobin  Date Value Ref Range Status  07/19/2013 8.3* 12.0 - 15.0 g/dL Final  02/06/2013 9.4* 12.0 - 15.0 g/dL Final  09/03/2012 8.9* 12.0 - 15.0 g/dL Final    DM type 2, controlled: Stable. Home medications: 70/30 Novolog 25 units qAM and 23 units  qPM, 1000mg  metformin BID.  - holding metformin during admission  - 25 units 70/30 Novolog BID  - SSI sensitive  - CBG AC and QHS  HTN: Stable. Home medications are Coreg 25mg  BID and olmesartan 20mg  daily.  - Coreg 25mg  BID  - irbesartan 150mg    Diet: Carb modified   VTE ppx: heparin  Dispo: Disposition is deferred at this time, awaiting improvement of current medical problems.  Anticipated discharge in approximately 1-3 day(s).   The patient does have a current PCP Jerene Pitch, MD) and does need an [facility] hospital follow-up appointment after discharge.  The patient does not have transportation limitations that hinder transportation to clinic appointments.  .Services Needed at time of discharge: Y = Yes, Blank = No PT:   OT:   RN:   Equipment:   Other:     LOS: 1 day   Lesly Dukes, MD 07/20/2013, 11:57 AM  Lesly Dukes, MD  [email or web address] Pager # [phone] Office # [phone]

## 2013-07-20 NOTE — H&P (Signed)
  Date: 07/20/2013  Patient name: Melody Gray  Medical record number: [id number]  Date of birth: [birth year]   I have seen and evaluated Loma and discussed their care with the Residency Team. Ms Repsher has Pul Sarcoid, last on steroids Fall 2013. She had onset of two weeks of progressive DOE, dry cough, increased O2 usage, and chest tightness. The sxs have severely limited her ability to walk and she has not been out of the home for two weeks. Her CXR suggests a RUL infiltrate. She has been afebrile without a leukocytosis. She was admitted and started on steroids, ABX, and nebs.   Assessment and Plan: I have seen and evaluated the patient as outlined above. I agree with the formulated Assessment and Plan as detailed in the residents' admission note, with the following changes:   1. Acute on chronic resp failure - likely 2/2 CAP and sarcoid flare. Cont ABX, O2, steroids, and nebs. She will need F/U Dr Melvyn Novas for steroid taper as sxs could be 2/2 CAP or Sarcoid and length of tx would differ.   Bartholomew Crews, MD 3/22/201511:53 AM

## 2013-07-21 ENCOUNTER — Ambulatory Visit: Payer: Medicare Other | Admitting: Internal Medicine

## 2013-07-21 DIAGNOSIS — R0609 Other forms of dyspnea: Secondary | ICD-10-CM

## 2013-07-21 DIAGNOSIS — I5032 Chronic diastolic (congestive) heart failure: Secondary | ICD-10-CM | POA: Diagnosis not present

## 2013-07-21 DIAGNOSIS — R0989 Other specified symptoms and signs involving the circulatory and respiratory systems: Secondary | ICD-10-CM

## 2013-07-21 DIAGNOSIS — I509 Heart failure, unspecified: Secondary | ICD-10-CM | POA: Diagnosis not present

## 2013-07-21 DIAGNOSIS — N182 Chronic kidney disease, stage 2 (mild): Secondary | ICD-10-CM | POA: Diagnosis not present

## 2013-07-21 LAB — CBC
HCT: 29.4 % — ABNORMAL LOW (ref 36.0–46.0)
Hemoglobin: 8.3 g/dL — ABNORMAL LOW (ref 12.0–15.0)
MCH: 24.1 pg — ABNORMAL LOW (ref 26.0–34.0)
MCHC: 28.2 g/dL — ABNORMAL LOW (ref 30.0–36.0)
MCV: 85.5 fL (ref 78.0–100.0)
Platelets: 212 10*3/uL (ref 150–400)
RBC: 3.44 MIL/uL — ABNORMAL LOW (ref 3.87–5.11)
RDW: 16.5 % — ABNORMAL HIGH (ref 11.5–15.5)
WBC: 10.8 10*3/uL — ABNORMAL HIGH (ref 4.0–10.5)

## 2013-07-21 LAB — BASIC METABOLIC PANEL
BUN: 26 mg/dL — ABNORMAL HIGH (ref 6–23)
CO2: 33 mEq/L — ABNORMAL HIGH (ref 19–32)
Calcium: 8.7 mg/dL (ref 8.4–10.5)
Chloride: 96 mEq/L (ref 96–112)
Creatinine, Ser: 1.07 mg/dL (ref 0.50–1.10)
GFR calc Af Amer: 72 mL/min — ABNORMAL LOW (ref >90)
GFR calc non Af Amer: 62 mL/min — ABNORMAL LOW (ref >90)
Glucose, Bld: 369 mg/dL — ABNORMAL HIGH (ref 70–99)
Potassium: 4.9 mEq/L (ref 3.7–5.3)
Sodium: 138 mEq/L (ref 137–147)

## 2013-07-21 LAB — LEGIONELLA ANTIGEN, URINE: Legionella Antigen, Urine: NEGATIVE

## 2013-07-21 LAB — GLUCOSE, CAPILLARY
Glucose-Capillary: 313 mg/dL — ABNORMAL HIGH (ref 70–99)
Glucose-Capillary: 238 mg/dL — ABNORMAL HIGH (ref 70–99)

## 2013-07-21 MED ORDER — PREDNISONE 20 MG PO TABS: 40.0000 mg | ORAL_TABLET | Freq: Every day | ORAL | Status: DC

## 2013-07-21 MED ORDER — AZITHROMYCIN 250 MG PO TABS
250.0000 mg | ORAL_TABLET | ORAL | Status: DC
  Filled 2013-07-21: qty 1

## 2013-07-21 MED ORDER — AZITHROMYCIN 250 MG PO TABS: 250.0000 mg | ORAL_TABLET | Freq: Every day | ORAL | Status: AC

## 2013-07-21 NOTE — Discharge Summary (Signed)
Name: Melody Gray MRN: [id number] DOB: 05/12/1970 43 y.o. PCP: Jerene Pitch, MD  Date of Admission: 07/19/2013  8:30 PM Date of Discharge: 07/21/2013 Attending Physician: Dominic Pea, DO  Discharge Diagnosis: Principal Problem:   Community acquired pneumonia Active Problems:   Sarcoidosis   Diabetes mellitus type II, controlled   OBESITY, MORBID   Anemia in chronic kidney disease(285.21)   NICOTINE ADDICTION   HYPERTENSION   CONGESTIVE HEART FAILURE   Respiratory failure, chronic   GERD   Pulmonary hypertension associated with sarcoidosis   Cardiomyopathy   CAP (community acquired pneumonia)  Discharge Medications:   Medication List    STOP taking these medications       ALKA-SELTZER PLUS COLD & COUGH 7.11-30-08-325 MG Tbef  Generic drug:  Phenyleph-CPM-DM-Aspirin      TAKE these medications       azithromycin 250 MG tablet  Commonly known as:  ZITHROMAX  Take 1 tablet (250 mg total) by mouth daily.     BENICAR 40 MG tablet  Generic drug:  olmesartan  take 1/2 tablet by mouth once daily     carvedilol 25 MG tablet  Commonly known as:  COREG  take 1 tablet by mouth twice a day with food     furosemide 40 MG tablet  Commonly known as:  LASIX  take 1 tablet by mouth once daily     insulin aspart protamine- aspart (70-30) 100 UNIT/ML injection  Commonly known as:  NOVOLOG MIX 70/30  Inject 0.25 mLs (25 Units total) into the skin 2 (two) times daily with a meal. Taking 25 in AM and 23 in PM     metFORMIN 1000 MG tablet  Commonly known as:  GLUCOPHAGE  Take 1 tablet (1,000 mg total) by mouth 2 (two) times daily with a meal.     omeprazole 40 MG capsule  Commonly known as:  PRILOSEC  take 1 capsule by mouth once daily     predniSONE 20 MG tablet  Commonly known as:  DELTASONE  Take 2 tablets (40 mg total) by mouth daily with breakfast.        Disposition and follow-up:   MelodyAlexanderia S Gougeon was discharged from [facility] in  Stable condition.  At the hospital follow up visit please address:  1.  Improvement in DOE? CBG/DM2 control on steroids. Please refer to pulmonary rehabilitation. Please taper steroids.  2.  Labs / imaging needed at time of follow-up: BMP (Cr), CBC  3.  Pending labs/ test needing follow-up: None  Follow-up Appointments:     Follow-up Information   Follow up with Christinia Gully, MD On 07/24/2013. (@9am . This is your lung doctor.)    Specialty:  Pulmonary Disease   Contact information:   [address] [place] [address] [phone]       Follow up with Vertell Novak, MD On 07/25/2013. (@9 :45am. This is your primary care clinic.)    Specialty:  Internal Medicine   Contact information:   [place] [address] [phone]       Discharge Instructions: Discharge Orders   Future Appointments Provider Department Dept Phone   07/24/2013 9:00 AM Tanda Rockers, MD [facility] [phone]   07/25/2013 9:45 AM Olga Millers, MD [place] [phone]   09/16/2013 3:15 PM Jerene Pitch, MD [facility] [place] [phone]   Future Orders Complete By Expires   Call MD for:  difficulty breathing, headache or  visual disturbances  As directed    Call MD for:  severe uncontrolled pain  As directed    Call MD for:  temperature >100.4  As directed    Diet - low sodium heart healthy  As directed    Increase activity slowly  As directed       Consultations:  None  Procedures Performed:  Dg Chest 2 View  07/19/2013   CLINICAL DATA:  Shortness of breath, cold, cough  EXAM: CHEST  2 VIEW  COMPARISON:  04/08/2013  FINDINGS: Mild patchy right upper lobe opacity, suspicious for pneumonia.  Additional mild patchy bilateral lower lobe opacities, possibly atelectasis. No pleural effusion or pneumothorax.  Mild cardiomegaly.  Mild degenerative changes of the visualized thoracolumbar spine.  IMPRESSION: Mild patchy right  upper lobe opacity, suspicious for pneumonia.  Additional mild patchy bilateral lower lobe opacities, possibly atelectasis.   Electronically Signed   By: Julian Hy M.D.   On: 07/19/2013 20:13    Admission HPI:  Melody Gray is a 43 year old woman with a PMH of sarcoidosis (on 2L home oxygen), pulmonary HTN, obstructive lung disease, diastolic HF (EF Q000111Q Nov 2013), CKD, anemia of ckd, HTN and DM type 2 (Hgb A1c 6.05 May 2013) and GERD who presents with c/o increased DOE x 1 week. She follows with Dr. Melvyn Novas for her pulmonary disease and has not required steroid treatment since Oct 2013. She thought she was coming down with a cold and that it was contributing to her dyspnea, however her symptoms are not improving. She normally uses 2L of oxygen when she is at home. She usually can go out shopping without her oxygen. For the past week she has had to increase from 2L --> 4L. Associated symptoms include central chest and upper abdomen tightness that she says feels like indigestion. She denies radiation of the pain into arm/jaw, N/V or diaphoresis. She notes the pain when she has the dyspnea, which is on exertion. She reports subjective fevers and has a dry cough but denies mucous production. Regarding her HF, she reports compliance with medications, including Lasix and has not noticed an increase in swelling. She reports orthopnea and PND that is unchanged. She is smoking 1 pack of cigarettes every 3 days.   In the ED: T 98.70F, RR 22, SpO2 100% on 4L via [place], HR 83, BP 131/32mmHg; she received 500mg  azithromycin, 1g Rocephin, 125mg  Solu-Medrol and duoneb.  Physical Exam:  Blood pressure 131/81, pulse 77, temperature 98.5 F (36.9 C), temperature source Oral, resp. rate 16, weight 234 lb (106.142 kg), last menstrual period 07/10/2013, SpO2 100.00%.  General: resting in bed in NAD, she is able to speak in complete sentences  HEENT: PERRL, EOMI, oropharynx clear and moist, no JVD  Cardiac: RRR, no rubs,  murmurs or gallops, + central chest TTP  Pulm: diffuse wheezing, no rales, no respiratory distress  Abd: soft, nondistended, BS present, + epigastric TTP  Ext: warm and well perfused, 1+ lower extremity edema, 2 DPs  Neuro: alert and oriented X3, cranial nerves II-XII grossly intact, 5/5 MMS upper and lower extremities   Hospital Course by problem list:  1. Dyspnea on exertion, likely 2/2 to CAP and Sarcoidosis flare - Main issue is dyspnea on exertion. She feels well at rest. XR showed mild patchy right upper lobe opacity, suspicious for pneumonia, which correlates with soft crackles on exam. Strep pneumo/legionella urine antigens negative. Flu panel negative. PFTs 07/13 suggested mixed obstructive/restrictive disease, but her pulmonologist does not  feel she has COPD. (Per Dr. Gustavus Bryant note 3 months ago, the patient's symptoms including DOE at that time seemed 2/2 to HF and obesity.) Mild wheezing was noted on her exam. She has a history of sarcoidosis with last flare in 2013. We treated for both with IV ceftriaxone and azithromycin, Prednisone 40mg  daily (she got Solu-Medrol 125 IV in ED), and Duonebs q6h. On hosptial day 2 she was feeling better, so we converted her to antibiotic monotherapy with Azithromycin 250mg  po. PT worked with her and recommended pulmonary rehabilitation. She was noted to require 3L/min supplemental O2 throughout session, so we involved care management to increase her home O2 rate (she used 2L prior to admission). Close follow up was arranged with Dr. Melvyn Novas to arrange for pulmonary rehab and taper steroids.  2. Chronic diastolic heart failure: ECHO in Nov 2013 - EF 45-50% with grade 1 diastolic dysfunction. No evidence of acute exacerbation - no/trace lower extremity edema, wt unchanged, PND and orthopnea stable. DOE seems 2/2 to sarcoid and acute infection (CAP). We felt no need for repeat echo. We continued Lasix 40mg  daily, Coreg 25mg  BID.  3. CKD2: Baseline Cr 0.6-0.8. Cr  1.11 at admission. Downtrending this am. Repeat BMP as outpatient.   Creatinine, Ser   Date  Value  Ref Range  Status   07/21/2013  1.07  0.50 - 1.10 mg/dL  Final   07/20/2013  1.06  0.50 - 1.10 mg/dL  Final   07/20/2013  1.15*  0.50 - 1.10 mg/dL  Final    4. Anemia of CKD: Baseline Hgb 8.6-9.8. Hgb 8.3 on admission. No s/s of bleeding.    Hemoglobin   Date  Value  Ref Range  Status   07/21/2013  8.3*  12.0 - 15.0 g/dL  Final   07/19/2013  8.3*  12.0 - 15.0 g/dL  Final   02/06/2013  9.4*  12.0 - 15.0 g/dL  Final    5. DM type 2, controlled: A1C 6.5 in 01/15. Home medications: 70/30 Novolog 25 units qAM and 23 units qPM, 1000mg  metformin BID. We held metformin during admission and provided 25 units 70/30 Novolog BID with a SSI sensitive. She may need outpatient adjustment of her medications due to steroids.  6. HTN: Stable. Home medications are Coreg 25mg  BID and olmesartan 20mg  daily.    Discharge Vitals:   BP 142/73  Pulse 90  Temp(Src) 97.7 F (36.5 C) (Oral)  Resp 18  Ht 5' (1.524 m)  Wt 239 lb 4.8 oz (108.546 kg)  BMI 46.74 kg/m2  SpO2 97%  LMP 07/10/2013  Discharge Labs:  Results for orders placed during the hospital encounter of 07/19/13 (from the past 24 hour(s))  GLUCOSE, CAPILLARY     Status: Abnormal   Collection Time    07/20/13  2:04 PM      Result Value Ref Range   Glucose-Capillary 308 (*) 70 - 99 mg/dL  GLUCOSE, CAPILLARY     Status: Abnormal   Collection Time    07/20/13  6:13 PM      Result Value Ref Range   Glucose-Capillary 313 (*) 70 - 99 mg/dL  GLUCOSE, CAPILLARY     Status: Abnormal   Collection Time    07/20/13  9:30 PM      Result Value Ref Range   Glucose-Capillary 287 (*) 70 - 99 mg/dL  BASIC METABOLIC PANEL     Status: Abnormal   Collection Time    07/21/13  5:39 AM  Result Value Ref Range   Sodium 138  137 - 147 mEq/L   Potassium 4.9  3.7 - 5.3 mEq/L   Chloride 96  96 - 112 mEq/L   CO2 33 (*) 19 - 32 mEq/L   Glucose, Bld 369 (*) 70 -  99 mg/dL   BUN 26 (*) 6 - 23 mg/dL   Creatinine, Ser 1.07  0.50 - 1.10 mg/dL   Calcium 8.7  8.4 - 10.5 mg/dL   GFR calc non Af Amer 62 (*) >90 mL/min   GFR calc Af Amer 72 (*) >90 mL/min  CBC     Status: Abnormal   Collection Time    07/21/13  5:39 AM      Result Value Ref Range   WBC 10.8 (*) 4.0 - 10.5 K/uL   RBC 3.44 (*) 3.87 - 5.11 MIL/uL   Hemoglobin 8.3 (*) 12.0 - 15.0 g/dL   HCT 29.4 (*) 36.0 - 46.0 %   MCV 85.5  78.0 - 100.0 fL   MCH 24.1 (*) 26.0 - 34.0 pg   MCHC 28.2 (*) 30.0 - 36.0 g/dL   RDW 16.5 (*) 11.5 - 15.5 %   Platelets 212  150 - 400 K/uL  GLUCOSE, CAPILLARY     Status: Abnormal   Collection Time    07/21/13  6:24 AM      Result Value Ref Range   Glucose-Capillary 313 (*) 70 - 99 mg/dL  GLUCOSE, CAPILLARY     Status: Abnormal   Collection Time    07/21/13 11:11 AM      Result Value Ref Range   Glucose-Capillary 238 (*) 70 - 99 mg/dL    Signed: Lesly Dukes, MD 07/21/2013, 1:40 PM   Time Spent on Discharge: 35 minutes Services Ordered on Discharge: None Equipment Ordered on Discharge: None

## 2013-07-21 NOTE — Evaluation (Signed)
Physical Therapy Evaluation Patient Details Name: Melody Gray MRN: [id number] DOB: [birth year] Today's Date: 07/21/2013 Time: [id number] PT Time Calculation (min): 24 min  PT Assessment / Plan / Recommendation History of Present Illness  Pt is a 43 y/o female admitted with community aquired pneumonia. Pt with progressively worsening DOE and increasing home O2 use. Symptoms have prevented her from leaving the house in the past 2 weeks.   Clinical Impression  This patient presents with acute pain and decreased functional independence following the above mentioned diagnosis. At the time of PT eval, pt was able to ambulate in hallways with no AD or LOB noted. Pt did complain of BLE pain which she attributes to a medication she has been getting since admission. Pt states she is close to her baseline of function and will have 24 hour assist at home. Will continue to follow in the acute care setting to improve safety and independence prior to d/c.      PT Assessment  Patient needs continued PT services    Follow Up Recommendations  Other (comment) (Pulmonary Rehab)    Does the patient have the potential to tolerate intense rehabilitation      Barriers to Discharge        Equipment Recommendations  None recommended by PT    Recommendations for Other Services     Frequency Min 3X/week    Precautions / Restrictions Precautions Precautions: Fall Restrictions Weight Bearing Restrictions: No   Pertinent Vitals/Pain Pt on 3L/min supplemental O2 throughout session. At end of gait training, pt states she feels pressure in her ears upon seated rest break. Vital signs at that time are as follows: 141/85 BP, 90% O2, 81 HR. RN aware      Mobility  Bed Mobility General bed mobility comments: NT - pt received sitting EOB with family member present in room. Transfers Overall transfer level: Needs assistance Equipment used: None Transfers: Sit to/from Stand Sit to Stand: Supervision General  transfer comment: Pt able to power-up to full stand, taking increased time at completion of stand to steady herself before beginning ambulation.  Ambulation/Gait Ambulation/Gait assistance: Min guard;Supervision Ambulation Distance (Feet): 160 Feet Assistive device: None Gait Pattern/deviations: Step-through pattern;Decreased stride length;Leaning posteriorly;Wide base of support Gait velocity: Decreased Gait velocity interpretation: Below normal speed for age/gender General Gait Details: Pt overweight and balances herself with posterior lean of trunk while ambulating. Pt was able to ambulate with no AD and no noted LOB, however was observed running into objects as she passed - chair backs, railings on corners, etc. When asked about this pt states "I meant to do that".     Exercises     PT Diagnosis: Difficulty walking  PT Problem List: Decreased strength;Decreased range of motion;Decreased activity tolerance;Decreased balance;Decreased mobility;Decreased knowledge of use of DME;Decreased safety awareness;Pain;Cardiopulmonary status limiting activity PT Treatment Interventions: DME instruction;Gait training;Functional mobility training;Stair training;Therapeutic activities;Therapeutic exercise;Neuromuscular re-education;Patient/family education     PT Goals(Current goals can be found in the care plan section) Acute Rehab PT Goals Patient Stated Goal: To be able to leave the house  PT Goal Formulation: With patient Time For Goal Achievement: 08/04/13 Potential to Achieve Goals: Good  Visit Information  Last PT Received On: 07/21/13 Assistance Needed: +1 History of Present Illness: Pt is a 43 y/o female admitted with community aquired pneumonia. Pt with progressively worsening DOE and increasing home O2 use. Symptoms have prevented her from leaving the house in the past 2 weeks.        Prior  Functioning  Home Living Family/patient expects to be discharged to:: Private residence Living  Arrangements: Other relatives Available Help at Discharge: Family;Available 24 hours/day Type of Home: House Home Access: Stairs to enter [employer] of Steps: 5 Entrance Stairs-Rails: Left Home Layout: One level Home Equipment: Crutches;Cane - single point;Other (comment) (Oxygen ) Additional Comments: 2L/min supplemental O2 at home Prior Function Level of Independence: Independent Comments: Not working - on disability. Still driving. When gout flares up in L ankle use cane. Communication Communication: No difficulties Dominant Hand: Right    Cognition  Cognition Arousal/Alertness: Awake/alert Behavior During Therapy: WFL for tasks assessed/performed Overall Cognitive Status: Within Functional Limits for tasks assessed    Extremity/Trunk Assessment Upper Extremity Assessment Upper Extremity Assessment: Overall WFL for tasks assessed Lower Extremity Assessment Lower Extremity Assessment: Overall WFL for tasks assessed Cervical / Trunk Assessment Cervical / Trunk Assessment: Lordotic   Balance Balance Overall balance assessment: No apparent balance deficits (not formally assessed)  End of Session PT - End of Session Equipment Utilized During Treatment: Gait belt;Oxygen (3L/min supplemental O2) Activity Tolerance: Patient tolerated treatment well Patient left: in chair;with call bell/phone within reach Nurse Communication: Mobility status  GP     Jolyn Lent 07/21/2013, 12:21 PM  Jolyn Lent, PT, DPT Acute Rehabilitation Services Pager: [phone]

## 2013-07-21 NOTE — Care Management Note (Signed)
    Page 1 of 1   07/21/2013     5:19:35 PM   CARE MANAGEMENT NOTE 07/21/2013  Patient:  Melody Gray, Melody Gray   Account Number:  [id number]  Date Initiated:  07/21/2013  Documentation initiated by:  Fahim Kats  Subjective/Objective Assessment:   PT ADM ON 3/21 WITH PNA.  PTA, PT INDEPENDENT OF ADLS.  SHE IS ON CHRONIC HOME OXYGEN AT HOME THROUGH [facility]  (2L/[place])     Action/Plan:   NOTIFIED [facility] OF CHANGE IN O2 LITER FLOW TO 3L/[place].  [place] [facility] WILL SEND THIS INFO IN TO MAIN OFFICE.   Anticipated DC Date:  07/21/2013   Anticipated DC Plan:  [place]  CM consult      Choice offered to / List presented to:             Status of service:  Completed, signed off Medicare Important Message given?   (If response is "NO", the following Medicare IM given date fields will be blank) Date Medicare IM given:   Date Additional Medicare IM given:    Discharge Disposition:  HOME/SELF CARE  Per UR Regulation:  Reviewed for med. necessity/level of care/duration of stay  If discussed at [place] of Stay Meetings, dates discussed:    Comments:

## 2013-07-21 NOTE — Progress Notes (Signed)
Inpatient Diabetes Program Recommendations  AACE/ADA: New Consensus Statement on Inpatient Glycemic Control (2013)  Target Ranges:  Prepandial:   less than 140 mg/dL      Peak postprandial:   less than 180 mg/dL (1-2 hours)      Critically ill patients:  140 - 180 mg/dL  Results for AURELIE, MISA (MRN [id number]) as of 07/21/2013 13:30  Ref. Range 07/20/2013 14:04 07/20/2013 18:13 07/20/2013 21:30 07/21/2013 06:24 07/21/2013 11:11  Glucose-Capillary Latest Range: 70-99 mg/dL 308 (H) 313 (H) 287 (H) 313 (H) 238 (H)   Inpatient Diabetes Program Recommendations Correction (SSI): increase to resistant scale during steroid therapy  Thank you  Raoul Pitch BSN, RN,CDE Inpatient Diabetes Coordinator [phone] (team pager)

## 2013-07-21 NOTE — Discharge Instructions (Signed)
It was a pleasure taking care of you. - Please follow up with Dr. Melvyn Novas this week on Thursday. We are going to recommend that he taper your steroids and refer you to something called "pulmonary rehab" to help with your breathing. - We have also made a hospital follow up appointment in the clinic later this week. - You have a pneumonia. Please continue to take your antibiotic for 3 more days. - You also may have a sarcoid flare. Please continue your steroid until you see Dr. Melvyn Novas this week (2 tabs per day). I have ordered you #30 tablets (15 days worth). - We have increased your oxygen to 3L continuous. Our care manager will coordinate with your home health agency to make sure you have enough tanks. - If you develop severe shortness of breath, chest pain, or fever please call the clinic or return to the ED.

## 2013-07-21 NOTE — Progress Notes (Signed)
Subjective: Patient seen and examined at the bedside this morning. Very pleasant. She is eating breakfast and feels well. She is still a little SOB when walking and amenable to working with PT.  Objective: Vital signs in last 24 hours: Filed Vitals:   07/20/13 2027 07/20/13 2100 07/21/13 0157 07/21/13 0500  BP:  177/83  142/73  Pulse:  88  90  Temp:  97.8 F (36.6 C)  97.7 F (36.5 C)  TempSrc:  Oral  Oral  Resp:  22  18  Height:      Weight:      SpO2: 97% 97% 96% 97%   Weight change:   Intake/Output Summary (Last 24 hours) at 07/21/13 0903 Last data filed at 07/20/13 1847  Gross per 24 hour  Intake    720 ml  Output      0 ml  Net    720 ml   Physical Exam:  General: resting in bed in NAD, she is able to speak in complete sentences, very pleasant HEENT: PERRL, EOMI, oropharynx clear and moist, no JVD  Cardiac: RRR, no rubs, murmurs or gallops Pulm: Mild wheezing, soft velco-like crackles in right upper lung field, no respiratory distress  Abd: soft, nontender, nondistended, BS present Ext: warm and well perfused, trace lower extremity edema Neuro: alert and oriented X3, cranial nerves II-XII grossly intact, 5/5 MMS upper and lower extremities  Lab Results: Basic Metabolic Panel:  Recent Labs Lab 07/20/13 0854 07/21/13 0539  NA 141 138  K 5.5* 4.9  CL 97 96  CO2 34* 33*  GLUCOSE 265* 369*  BUN 27* 26*  CREATININE 1.06 1.07  CALCIUM 8.9 8.7   Liver Function Tests: No results found for this basename: AST, ALT, ALKPHOS, BILITOT, PROT, ALBUMIN,  in the last 168 hours No results found for this basename: LIPASE, AMYLASE,  in the last 168 hours No results found for this basename: AMMONIA,  in the last 168 hours CBC:  Recent Labs Lab 07/19/13 1900 07/21/13 0539  WBC 7.6 10.8*  NEUTROABS 4.0  --   HGB 8.3* 8.3*  HCT 28.7* 29.4*  MCV 83.2 85.5  PLT 207 212   Cardiac Enzymes: No results found for this basename: CKTOTAL, CKMB, CKMBINDEX, TROPONINI,  in  the last 168 hours BNP: No results found for this basename: PROBNP,  in the last 168 hours D-Dimer: No results found for this basename: DDIMER,  in the last 168 hours CBG:  Recent Labs Lab 07/19/13 1846 07/20/13 0639 07/20/13 1404 07/20/13 1813 07/20/13 2130 07/21/13 0624  GLUCAP 147* 300* 308* 313* 287* 313*   Hemoglobin A1C: No results found for this basename: HGBA1C,  in the last 168 hours Fasting Lipid Panel: No results found for this basename: CHOL, HDL, LDLCALC, TRIG, CHOLHDL, LDLDIRECT,  in the last 168 hours Thyroid Function Tests: No results found for this basename: TSH, T4TOTAL, FREET4, T3FREE, THYROIDAB,  in the last 168 hours Coagulation: No results found for this basename: LABPROT, INR,  in the last 168 hours Anemia Panel: No results found for this basename: VITAMINB12, FOLATE, FERRITIN, TIBC, IRON, RETICCTPCT,  in the last 168 hours Urine Drug Screen: Drugs of Abuse  No results found for this basename: labopia,  cocainscrnur,  labbenz,  amphetmu,  thcu,  labbarb    Alcohol Level: No results found for this basename: ETH,  in the last 168 hours Urinalysis: No results found for this basename: COLORURINE, APPERANCEUR, LABSPEC, PHURINE, GLUCOSEU, HGBUR, BILIRUBINUR, [place], PROTEINUR, UROBILINOGEN, NITRITE, LEUKOCYTESUR,  in the last 168 hours   Micro Results: No results found for this or any previous visit (from the past 240 hour(s)). Studies/Results: Dg Chest 2 View  07/19/2013   CLINICAL DATA:  Shortness of breath, cold, cough  EXAM: CHEST  2 VIEW  COMPARISON:  04/08/2013  FINDINGS: Mild patchy right upper lobe opacity, suspicious for pneumonia.  Additional mild patchy bilateral lower lobe opacities, possibly atelectasis. No pleural effusion or pneumothorax.  Mild cardiomegaly.  Mild degenerative changes of the visualized thoracolumbar spine.  IMPRESSION: Mild patchy right upper lobe opacity, suspicious for pneumonia.  Additional mild patchy bilateral lower  lobe opacities, possibly atelectasis.   Electronically Signed   By: Julian Hy M.D.   On: 07/19/2013 20:13   Medications: I have reviewed the patient's current medications. Scheduled Meds: . azithromycin  250 mg Oral Q24H  . carvedilol  25 mg Oral BID WC  . furosemide  40 mg Oral Daily  . heparin  5,000 Units Subcutaneous 3 times per day  . insulin aspart  0-9 Units Subcutaneous TID WC  . insulin aspart protamine- aspart  25 Units Subcutaneous BID WC  . ipratropium-albuterol  3 mL Nebulization Q6H  . pantoprazole  40 mg Oral BID  . predniSONE  40 mg Oral Q breakfast  . sodium chloride  3 mL Intravenous Q12H  . sodium chloride  3 mL Intravenous Q12H   Continuous Infusions:  PRN Meds:.sodium chloride, albuterol, sodium chloride  Assessment/Plan:  #Dyspnea on exertion, likely 2/2 to CAP and Sarcoidosis: Main issue is dyspnea on exertion. She feels well at rest. XR show mild patchy right upper lobe opacity, suspicious for pneumonia, which correlates with soft crackles on exam. Strep pneumo antigen negative. Flu panel negative. PFTs 07/13 suggested mixed obstructive/restrictive disease, but her pulmonologist does not feel she has COPD. (Per Dr. Gustavus [place] note 3 months ago, the patient's symptoms including DOE at that time seemed 2/2 to HF and obesity.) Mild wheezing noted on exam today. History of sarcoidosis with last flare in 2013. Will treat for both. - Convert to monotherapy with Azithromycin 250mg  po, day 2/5 - Prednisone 40mg  daily, she is s/p Solu-Medrol 125 IV in ED - Duonebs q6h  - Protonix 40mg  BID  - Telemetry - Supplemental oxygen prn  - PT eval and treat, then likely medically stable for discharge  Chronic diastolic heart failure: ECHO in Nov 2013 - EF 45-50% with grade 1 diastolic dysfunction. No evidence of acute exacerbation - trace lower extremity edema, wt unchanged, PND and orthopnea stable. DOE seems 2/2 to sarcoid and acute infection (CAP). No need for repeat  echo. - Continue Lasix 40mg  daily, Coreg 25mg  BID   CKD2: Baseline Cr 0.6-0.8. Cr 1.11 at admission. Downtrending this am. - Monitor renal function   Creatinine, Ser  Date Value Ref Range Status  07/21/2013 1.07  0.50 - 1.10 mg/dL Final  07/20/2013 1.06  0.50 - 1.10 mg/dL Final  07/20/2013 1.15* 0.50 - 1.10 mg/dL Final    Anemia of CKD: Baseline Hgb 8.6-9.8. Hgb 8.3 on admission. No s/s of bleeding.  - Monitor CBC   Hemoglobin  Date Value Ref Range Status  07/21/2013 8.3* 12.0 - 15.0 g/dL Final  07/19/2013 8.3* 12.0 - 15.0 g/dL Final  02/06/2013 9.4* 12.0 - 15.0 g/dL Final    DM type 2, controlled: Stable. Home medications: 70/30 Novolog 25 units qAM and 23 units qPM, 1000mg  metformin BID.  - Holding metformin during admission  - 25 units 70/30 Novolog BID  -  SSI sensitive  - CBG AC and QHS  HTN: Stable. Home medications are Coreg 25mg  BID and olmesartan 20mg  daily.  - Coreg 25mg  BID  - Irbesartan 150mg    Diet: Carb modified   VTE ppx: heparin  Dispo: Disposition is deferred at this time, awaiting improvement of current medical problems.  Anticipated discharge in approximately 1-3 day(s).   The patient does have a current PCP Jerene Pitch, MD) and does need an [facility] hospital follow-up appointment after discharge.  The patient does not have transportation limitations that hinder transportation to clinic appointments.  .Services Needed at time of discharge: Y = Yes, Blank = No PT:   OT:   RN:   Equipment:   Other:     LOS: 2 days   Lesly Dukes, MD 07/21/2013, 9:03 AM  Lesly Dukes, MD  [email or web address] Pager # [phone] Office # [phone]

## 2013-07-21 NOTE — Progress Notes (Signed)
  Date: 07/21/2013  Patient name: Melody Gray  Medical record number: [id number]  Date of birth: April 13, 1971   This patient has been seen and the plan of care was discussed with the house staff. Please see their note for complete details. I concur with their findings with the following additions/corrections: Feels better this morning. Will continue prednisone as outpatient and follow up with Pulmonary. Finish course of azithro for CAP. O2 to be adjusted given acute state. Medically stable for D/C.  Dominic Pea, DO, [facility] 07/21/2013, 3:23 PM

## 2013-07-22 NOTE — Discharge Summary (Signed)
  Date: 07/22/2013  Patient name: Melody Gray  Medical record number: [id number]  Date of birth: Feb 15, 1971   This patient has been seen and the plan of care was discussed with the house staff. Please see their note for complete details. I concur with their findings and plan.  Dominic Pea, DO, [facility] 07/22/2013, 7:57 PM

## 2013-07-24 ENCOUNTER — Ambulatory Visit (INDEPENDENT_AMBULATORY_CARE_PROVIDER_SITE_OTHER): Payer: Medicare Other | Admitting: Internal Medicine

## 2013-07-24 ENCOUNTER — Encounter: Payer: Self-pay | Admitting: Internal Medicine

## 2013-07-24 VITALS — BP 142/86 | HR 79 | Temp 98.0°F | Ht 61.0 in | Wt 247.4 lb

## 2013-07-24 DIAGNOSIS — I2789 Other specified pulmonary heart diseases: Secondary | ICD-10-CM | POA: Diagnosis not present

## 2013-07-24 DIAGNOSIS — D869 Sarcoidosis, unspecified: Secondary | ICD-10-CM | POA: Diagnosis not present

## 2013-07-24 DIAGNOSIS — F17200 Nicotine dependence, unspecified, uncomplicated: Secondary | ICD-10-CM | POA: Diagnosis not present

## 2013-07-24 DIAGNOSIS — J9610 Chronic respiratory failure, unspecified whether with hypoxia or hypercapnia: Secondary | ICD-10-CM

## 2013-07-24 DIAGNOSIS — I2729 Other secondary pulmonary hypertension: Secondary | ICD-10-CM

## 2013-07-24 NOTE — Progress Notes (Signed)
Subjective:     Patient ID: Melody Gray, female   DOB: 05-17-1970   MRN: [id number]   Brief patient profile:  17 yobf  Smoker dx of sarcoidosis in 2001 = sob, cough became 02 dep in July [id number] complicated by [facility]  last on chronic  prednsione 01/2012      History of Present Illness   January 06, 2010 ov  c/o increased SOB. Pt states outpt clinic advised her to follow up with Dr. Melvyn Novas for Sacoidosis. doe on 02 at 2 lpm sitting then 4 with activity gets off at the curb struggles with grocery store. Try taking Prednisone one half even days in am with breakfast  Be sure you take omeprazole Take one 30-60 min before first and last meals of the day  Stop nifedipine  Start Cardizem 240 mg once daily  Wear 24 hours per day, 2 lpm at rest and sleeping, 4lpm with activity, this is the best way to help your heart function better  CT Chest ( repeated with contrast)> no PE, just sarcoid changes   February 09, 2010 Followup sarcoid w/ PFTs. Breathing is the same- no better or worse.on 02 2 lpm w/in and then outside using 4lpm> cc doe x 50 ft still has to stop every aisle at [employer] but not [facility] parking. rec Start Dulera 2 puffs first thing in am and 2 puffs again in pm about 12 hours later and prednisone 10 mg every other day.   May 04, 2010 ov cc doe some better to point to where feels needs 02 less. rec  1) Stay on dulera 200 2 puffs first thing in am and 2 puffs again in pm about 12 hours later  2) Work on perfecting inhaler technique 3) Prednisone 20 mg 1/4 every other day  4) Stay on 02 2lpm 24 hours per day except increase to 4lpm with exercise  5) Weight control t thru ex      02/12/2012 still smoking f/u ov/Jahnasia Tatum much better, no cough off acei cc no change doe @ prednisone q 3 days, walking 15 min on 02 - no change on days when misses prednisone.   rec Try off prednisone completely - call if breathing worsens or cough starts back or nausea/ weakness develop    04/08/2013 f/u ov/Kori Goins  re: sarcoid doe x one aisle at grocery, not wearing 02 with activity Chief Complaint  Patient presents with  . Follow-up    Last seen Nov 2013. Pt reports her breathing is unchanged since her last visit. No new co's today.   rec Wt loss/ wear 02 with activity to achieve neg cal balance.    Date of Admission: 07/19/2013 8:30 PM  Date of Discharge: 07/21/2013  Attending Physician: Dominic Pea, DO  Discharge Diagnosis:  Principal Problem:  Community acquired pneumonia  Active Problems:  Sarcoidosis  Diabetes mellitus type II, controlled  OBESITY, MORBID  Anemia in chronic kidney disease(285.21)  NICOTINE ADDICTION  HYPERTENSION  CONGESTIVE HEART FAILURE  Respiratory failure, chronic  GERD  Pulmonary hypertension associated with sarcoidosis  Cardiomyopathy  CAP (community acquired pneumonia)   Hospital Course by problem list:  1. Dyspnea on exertion, likely 2/2 to CAP and Sarcoidosis flare - Main issue is dyspnea on exertion. She feels well at rest. XR showed mild patchy right upper lobe opacity, suspicious for pneumonia, which correlates with soft crackles on exam. Strep pneumo/legionella urine antigens negative. Flu panel negative. PFTs 07/13 suggested mixed obstructive/restrictive disease, but her pulmonologist does not  feel she has COPD. (Per Dr. Gustavus Bryant note 3 months ago, the patient's symptoms including DOE at that time seemed 2/2 to HF and obesity.) Mild wheezing was noted on her exam. She has a history of sarcoidosis with last flare in 2013. We treated for both with IV ceftriaxone and azithromycin, Prednisone 40mg  daily (she got Solu-Medrol 125 IV in ED), and Duonebs q6h. On hosptial day 2 she was feeling better, so we converted her to antibiotic monotherapy with Azithromycin 250mg  po. PT worked with her and recommended pulmonary rehabilitation. She was noted to require 3L/min supplemental O2 throughout session, so we involved care management to increase her home O2 rate (she used  2L prior to admission). Close follow up was arranged with Dr. Melvyn Novas to arrange for pulmonary rehab and taper steroids.  2. Chronic diastolic heart failure: ECHO in Nov 2013 - EF 45-50% with grade 1 diastolic dysfunction. No evidence of acute exacerbation - no/trace lower extremity edema, wt unchanged, PND and orthopnea stable. DOE seems 2/2 to sarcoid and acute infection (CAP). We felt no need for repeat echo. We continued Lasix 40mg  daily, Coreg 25mg  BID.  3. CKD2: Baseline Cr 0.6-0.8. Cr 1.11 at admission. Downtrending this am. Repeat BMP as outpatient.    07/24/2013 post hosp f/u ov/Breahna Boylen re: ? Sarcoid flare/ still smoking  Chief Complaint  Patient presents with  . HFU    Pt states that her breathing is no better since hospital d/c. She reports having to use her o2 all of the time. She has minimal non prod cough.   onset was abrupt, sob developed in setting of gen chest/ upper abd tight sensation anteriorly, sym bilaterally better lying down, not much cough all, no better back on prednisone.   No obvious daytime variabilty or assoc pleurritic/ex cp or  subjective wheeze overt sinus or hb symptoms. No unusual exp hx or h/o childhood pna/ asthma or premature birth to his knowledge.    Sleeping ok on 02 without nocturnal  or early am exacerbation  of respiratory  c/o's or need for noct saba. Also denies any obvious fluctuation of symptoms with weather or environmental changes or other aggravating or alleviating factors except as outlined above   ROS  The following are not active complaints unless bolded sore throat, dysphagia, dental problems, itching, sneezing,  nasal congestion or excess/ purulent secretions, ear ache,   fever, chills, sweats, unintended wt loss, pleuritic or exertional cp, hemoptysis,  orthopnea pnd or leg swelling, presyncope, palpitations, heartburn, abdominal pain, anorexia, nausea, vomiting, diarrhea  or change in bowel or urinary habits, change in stools or urine,  dysuria,hematuria,  rash, arthralgias, visual complaints, headache, numbness weakness or ataxia or problems with walking or coordination,  change in mood/affect or memory.         Past Medical History:  Sarcoidosis (Dr Keonia Pasko)-w/ liver involvement per biopsy 03/2008 - Reversible airway component so start [facility] 01/2010 > better  - HFa 75% p coaching May 04, 2010  -  Off chronic prednisone 05/2011 Unexplained Hypoxemia July 2011  - CT angiogram 01/07/10 >>> no PE  - PFT's February 09, 2010 FEV1 1.20 (49%) with ratio 72 and FRC 90%, FEV1  16% better p B2, DLC0 33% > corrects to 84   Morbid obesity  - Target wt = 153 for BMI < 30  QT prolongation  Diabetes mellitus, type II  Hypertension  Heart catheterization 06-06-06 : No CAD, no RAS, normal EF  Hx eclampsia  Hx of scalp seborrheic dermatitis  Objective:   Physical Exam She is a minimally  cushingoid-appearing amb bf nad on 4lpm  wt 238 January 06, 2010 >   217 May 04, 2010 >  02/12/2012  213 >  215 04/04/2012  > 239 04/08/2013 > 07/24/2013 249  There is no stigmata liver disease  skin: anicteric  HEENT: normocephalic; PEERLA; no nasal or pharyngeal abnormalities  neck: supple  nodes: no cervical lymphadenopathy  chest: clear to ausculatation and percussion  heart: no murmurs, gallops, or rubs  abd: soft, nontender; BS normoactive; no abdominal masses, tenderness, organomegaly; abdomen is obese         cxr 07/19/13 Mild patchy right upper lobe opacity, suspicious for pneumonia.  Additional mild patchy bilateral lower lobe opacities, possibly  atelectasis.         Assessment:

## 2013-07-24 NOTE — Patient Instructions (Addendum)
Please see patient coordinator before you leave today  to schedule CTa chest  Treatment for your abdominal tightness  consists of avoiding foods that cause gas (especially beans and raw vegetables like spinach and salads boiled eggs)  and citrucel 1 heaping tsp twice daily with a large glass of water.  Pain should improve w/in 2 weeks and if not then consider further GI work up.    Wear 02 4lpm 24/7 and stop all smoking   Prednisone 20 mg one daily x 3 days, half daily x 3 days and stop  Please schedule a follow up office visit in 1 weeks, sooner if needed

## 2013-07-25 ENCOUNTER — Ambulatory Visit (HOSPITAL_COMMUNITY): Payer: Medicare Other | Attending: Cardiology | Admitting: *Deleted

## 2013-07-25 ENCOUNTER — Encounter: Payer: Self-pay | Admitting: Internal Medicine

## 2013-07-25 ENCOUNTER — Ambulatory Visit (INDEPENDENT_AMBULATORY_CARE_PROVIDER_SITE_OTHER): Payer: Medicare Other | Admitting: Internal Medicine

## 2013-07-25 ENCOUNTER — Other Ambulatory Visit: Payer: Self-pay | Admitting: Internal Medicine

## 2013-07-25 ENCOUNTER — Ambulatory Visit (INDEPENDENT_AMBULATORY_CARE_PROVIDER_SITE_OTHER)
Admission: RE | Admit: 2013-07-25 | Discharge: 2013-07-25 | Disposition: A | Discharge status: Home / Self Care | Payer: Medicare Other | Source: Ambulatory Visit | Attending: Internal Medicine | Admitting: Internal Medicine

## 2013-07-25 VITALS — BP 144/84 | HR 89 | Temp 97.0°F | Ht 61.0 in | Wt 256.0 lb

## 2013-07-25 DIAGNOSIS — I509 Heart failure, unspecified: Secondary | ICD-10-CM | POA: Diagnosis not present

## 2013-07-25 DIAGNOSIS — I10 Essential (primary) hypertension: Secondary | ICD-10-CM | POA: Diagnosis not present

## 2013-07-25 DIAGNOSIS — R0602 Shortness of breath: Secondary | ICD-10-CM | POA: Insufficient documentation

## 2013-07-25 DIAGNOSIS — I2699 Other pulmonary embolism without acute cor pulmonale: Secondary | ICD-10-CM

## 2013-07-25 DIAGNOSIS — J90 Pleural effusion, not elsewhere classified: Secondary | ICD-10-CM | POA: Diagnosis not present

## 2013-07-25 DIAGNOSIS — I2789 Other specified pulmonary heart diseases: Secondary | ICD-10-CM | POA: Insufficient documentation

## 2013-07-25 DIAGNOSIS — E119 Type 2 diabetes mellitus without complications: Secondary | ICD-10-CM

## 2013-07-25 DIAGNOSIS — D869 Sarcoidosis, unspecified: Secondary | ICD-10-CM

## 2013-07-25 DIAGNOSIS — F17200 Nicotine dependence, unspecified, uncomplicated: Secondary | ICD-10-CM | POA: Diagnosis not present

## 2013-07-25 DIAGNOSIS — I2729 Other secondary pulmonary hypertension: Secondary | ICD-10-CM

## 2013-07-25 DIAGNOSIS — J9819 Other pulmonary collapse: Secondary | ICD-10-CM | POA: Diagnosis not present

## 2013-07-25 DIAGNOSIS — I2721 Secondary pulmonary arterial hypertension: Secondary | ICD-10-CM

## 2013-07-25 LAB — BASIC METABOLIC PANEL WITH GFR
BUN: 16 mg/dL (ref 6–23)
CO2: 36 mEq/L — ABNORMAL HIGH (ref 19–32)
Calcium: 9.2 mg/dL (ref 8.4–10.5)
Chloride: 98 mEq/L (ref 96–112)
Creat: 0.83 mg/dL (ref 0.50–1.10)
GFR, Est African American: >89 mL/min
GFR, Est Non African American: 86 mL/min
Glucose, Bld: 178 mg/dL — ABNORMAL HIGH (ref 70–99)
Potassium: 4.1 mEq/L (ref 3.5–5.3)
Sodium: 145 mEq/L (ref 135–145)

## 2013-07-25 LAB — GLUCOSE, CAPILLARY: Glucose-Capillary: 148 mg/dL — ABNORMAL HIGH (ref 70–99)

## 2013-07-25 MED ORDER — IOHEXOL 350 MG/ML SOLN
80.0000 mL | Freq: Once | INTRAVENOUS | Status: AC | PRN
  Administered 2013-07-25: 80 mL via INTRAVENOUS

## 2013-07-25 NOTE — Assessment & Plan Note (Signed)

## 2013-07-25 NOTE — Progress Notes (Signed)
Subjective:     Patient ID: Melody Gray, female   DOB: Aug 26, 1970, 43 y.o.   MRN: [id number]  HPI The patient is a 43 YO female who is coming in today for a hospital follow up. She was in the hospital with SOB with exertion which was felt to be a sarcoid flare. She has PMH of sarcoid, PAH, heart failure. She was seen by the lung doctor yesterday. Reviewing their records would appear that they did not feel this was related to sarcoid and may be an abdominal process. They did order a repeat CT chest. Upon review of that imaging the lungs appear stable with some possible cirrhosis in the liver and enlarged spleen as well. She is supposed to taper off her steroids as the lung doctor does not think her sarcoid is to blame for her symptoms. She is on O2 2L by mouth 24/7. She has been doing okay since leaving the hospital but feels that she has gained several pounds from the prednisone. She is having some tightness in her abdomen that causes her breathing to be slightly worse although this clears when she is lying down. No fevers, chills, sputum. She is working on walking a little although this is a struggle for her. She is still smoking.   Review of Systems  Constitutional: Positive for fatigue. Negative for fever, chills, diaphoresis, activity change, appetite change and unexpected weight change.  Respiratory: Positive for cough and shortness of breath. Negative for apnea, choking, chest tightness, wheezing and stridor.   Cardiovascular: Negative for chest pain, palpitations and leg swelling.  Gastrointestinal: Positive for abdominal pain and abdominal distention. Negative for nausea, vomiting, diarrhea and constipation.  Musculoskeletal: Positive for gait problem. Negative for back pain, myalgias, neck pain and neck stiffness.  Neurological: Negative for dizziness, tremors, seizures, syncope, facial asymmetry, speech difficulty, weakness, light-headedness, numbness and headaches.       Objective:   Physical Exam  Constitutional: She is oriented to person, place, and time. She appears well-developed and well-nourished. No distress.  Obese  HENT:  Head: Normocephalic and atraumatic.  Neck: Normal range of motion. Neck supple. No tracheal deviation present. No thyromegaly present.  JVD hard to assess due to habitus  Cardiovascular: Normal rate and regular rhythm.   Pulmonary/Chest: Effort normal. No respiratory distress. She has no wheezes. She has no rales.  Breath sounds decreased due to habitus but no overt wheezing or rales. Able to carry on a conversation without breathlessness.  Abdominal: Soft. Bowel sounds are normal. She exhibits distension. There is no tenderness. There is no rebound and no guarding.  Patient has some mild gaseous distention as well as significant obesity.   Musculoskeletal: Normal range of motion. She exhibits no edema and no tenderness.  Lymphadenopathy:    She has no cervical adenopathy.  Neurological: She is alert and oriented to person, place, and time. No cranial nerve deficit.  Skin: Skin is warm and dry. She is not diaphoretic.       Assessment/Plan:   1. Hospital follow up - She was advised to stop smoking and spoke with her at length about how serious her lung and heart problems are and that she may soon be in a position that is much worse than her current situation. Discussed CT results with her. No change to regimen and she is tapering her prednisone and will return to pulmonology in 1 week.   2. Please see problem oriented charting.   3. Disposition - Patient will return in  1-2 months with PCP. She will follow up with lung doctor in 1 week. No change to regimen. Continue with steroid taper and no more antibiotics needed at this time. She will call if she continues to have weight gain, swelling, worsening SOB.

## 2013-07-25 NOTE — Progress Notes (Signed)
Lower extremity venous duplex complete

## 2013-07-25 NOTE — Assessment & Plan Note (Signed)
BP mildly above normal and the patient is out of benicar at this time which she will fill today. No change to regimen but would watch and adjust as needed for tight control given her other comorbid conditions. Check BMP today.

## 2013-07-25 NOTE — Patient Instructions (Signed)
You are at a time in your lung disease where you have a lot of serious lung problems which have started to affect your other organs including your heart. We cannot fix this damage but we can manage with medicines for now.  Some time soon in the future the lungs may get worse to the point where we can't manage your heart and your kidneys with just medicines and this could cause a lot of problems and you may not be able to get around as good as you can now.  Smoking will cause this time to come sooner and cause you to miss out in things in your life.   We are not changing your medicines today and the scan of your lungs did not show any changes.   Come back in 1-2 months to check on your blood pressure at our clinic and go to the lung doctor next week.  Call us with problems or questions at [phone].  Pulmonary Hypertension Pulmonary hypertension is high blood pressure within the arteries in your lungs (pulmonary arteries). It is different than having high blood pressure elsewhere in your body, such as blood pressure that is measured with a blood pressure cuff. Pulmonary hypertension makes it harder for blood to flow through the lungs. As a result, the heart must work harder to pump blood through the lungs, and it may be harder for you to breathe. Over time, this can weaken the heart muscle. Pulmonary hypertension is a serious condition and can be fatal.  CAUSES Many different medical conditions can cause pulmonary hypertension. They can be grouped into five different categories:  Group 1 Pulmonary hypertension caused by abnormal growth of small blood vessels in the lungs (pulmonary arterial hypertension). The abnormal blood vessel growth may have no known cause or may be:   Hereditary.  Caused by another disease such as a connective tissue disease (including lupus or scleroderma) or HIV.  Caused by certain drugs or toxins. Group 2 Pulmonary hypertension caused by weakness of the main chamber  of the heart (left ventricle) or heart valve disease. Group 3 Pulmonary hypertension caused by lung disease or low oxygen levels. Causes in this group include:  Emphysema or chronic obstructive pulmonary disease (COPD).  Untreated sleep apnea.  Pulmonary fibrosis. Group 4 Pulmonary hypertension caused by blood clots in the lungs (pulmonary emboli).  Group 5 Other causes of pulmonary hypertension, such as sickle cell anemia, or a mix between multiple causes. SIGNS AND SYMPTOMS  Shortness of breath. You may notice shortness of breath with:  Activity such as walking.  No activity.  Tiredness and fatigue.  Dizziness or fainting.  Rapid heartbeat or feeling the heart flutter or skip a beat (palpitations).  Neck vein enlargement.  Bluish color to the lips and fingertips. DIAGNOSIS  Various tests may be used to help diagnose pulmonary hypertension. These can include:  Chest X-ray.  Arterial blood gases. This test checks the oxygen level in your blood.  CT scans. This test can provide detailed images of your lungs.  Pulmonary function test. This test measures how much air your lungs can hold. It also tests how well air moves in and out of your lungs.  Electrocardiography. This test traces the electrical activity of your heart.  Echocardiography. This test is used to look at your heart in motion and how it functions.  Heart catheterization. This test can measure the pressure in your pulmonary artery and the right side of the heart.  Lung biopsy. A tissue sample  is sometimes taken from the lung to check for underlying causes. TREATMENT Pulmonary hypertension has no cure. Treatment is to help relieve symptoms and slow the progress of the condition. Treatment can involve:  Medicines such as:  Blood pressure medicines.  Medicines to relax (dilate) the pulmonary blood vessels.  Diuretic medicines.  Blood thinning medicines.  Surgery. For severe pulmonary hypertension  that does not respond to medical treatment, heart-lung or lung transplant may be needed. HOME CARE INSTRUCTIONS  Only take over-the-counter or prescription medicines as directed by your health care provider. Take all medicines exactly as instructed. Do not change or stop medicines without first checking with your health care provider.  Do not smoke.  Eat a healthy diet. Decrease how much salt (sodium) you eat by checking nutrition labels and using seasonings without salt. Talk to your health care provider or a dietitian about foods you should eat.  Stay as active as possible. Exercise as directed by your health care provider. Talk to your health care provider about what type of exercise is safe for you.  Avoid high altitudes.  Avoid hot tubs and saunas.  Avoid becoming pregnant, if this applies. Talk to your health care provider about safe methods of birth control.  Follow up with your health care provider as directed. SEEK IMMEDIATE MEDICAL CARE IF:  You have severe shortness of breath.  You develop chest pain or pressure.  You cough up blood.  You develop swelling of your feet or legs.  You have a significant increase in weight over 1 2 days. Document Released: 02/12/2007 Document Revised: 02/05/2013 Document Reviewed: 10/07/2012 [facility] Patient Information 2014 [place].

## 2013-07-25 NOTE — Assessment & Plan Note (Signed)
She will follow up with Dr. Melvyn Novas next week and he may switch her beta blocker. Advised of the harms of smoking and need to quit. Explained that this damage is permanent and will worsen over time causing more problems with her quality of life and that obesity and smoking will accelerate this progression.

## 2013-07-25 NOTE — Assessment & Plan Note (Signed)
Spoke with patient about the fact that her weight and especially her abdominal weight are affecting her breathing and making her problems worse. She is unable to exercise much and admits to eating salty foods and what she wants. She is gaining weight due to steroids at this time. Advised her that this weight will not just fall back off after stopping although weight gain should stop.

## 2013-07-25 NOTE — Assessment & Plan Note (Signed)
-   PFT's 11/21/2011 FEV1 1.23 (50%) ratio 67 and no better p B2,  DLCO 31 corrects to 73   - trial off chronic prednisone 02/12/2012   No evidence present symptoms are sarcoid related and have not changed since starting back on pred empirically so should taper off and check CT chest to sort all this out.  In meantime need to address her chief c/o = low chest and upper abd tightness which resolves in supine position typical of IBS

## 2013-07-25 NOTE — Assessment & Plan Note (Signed)
She is not ready to quit at this time although did talk with her about the risk of continuing to smoke. She states she knows but cannot quit at this time as it is easy to say but hard to do.

## 2013-07-25 NOTE — Assessment & Plan Note (Signed)
Stable weight with some weight gain from prednisone. She is not having swelling or worsening DOE. She is able to lie flat. She will call with weight gain. Talked to her about the seriousness of her heart failure and the affect of her lung disease on her heart.

## 2013-07-25 NOTE — Assessment & Plan Note (Signed)
-    Complicated by PAH - Walked 3 laps @ 185 ft each stopped due to  End of test, no desat on 4lpm 10/05/2010  - 02/02/2011  Walked RA  2 laps @ 185 ft each stopped due to  desat to 88 so change rx to 2lpm with activity outside the house and at hs - 08/21/2011  Walked RA x 1 laps @ 185 ft each stopped due to desat 85% - 11/21/2011  Walked RA  2 laps @ 185 ft each stopped due to  Sat 88 -   ONO RA < 89% x 6h 26 min 03/19/12 > repeat on 2lpm 04/05/12 > desats resolved - 04/08/2013   Walked RA x one lap @ 185 stopped due to  sats 85%   rx is 2lpm 24/7 as of 07/24/13

## 2013-07-25 NOTE — Progress Notes (Signed)
Quick Note:  Spoke with pt and notified of results per Dr. Wert. Pt verbalized understanding and denied any questions.  ______ 

## 2013-07-25 NOTE — Assessment & Plan Note (Signed)
-   repeat echo 10/14/10 LV much worse than RV so rx as L Ht failure by cards/[place]  If she does have an asthmatic component (as suggested by hosp notes), Strongly prefer in this setting: Bystolic, the most beta -1  selective Beta blocker available in sample form, with bisoprolol the most selective generic choice  on the market.

## 2013-07-28 ENCOUNTER — Encounter: Payer: Self-pay | Admitting: Internal Medicine

## 2013-07-28 NOTE — Progress Notes (Signed)
Case discussed with Dr. Kollar soon after the resident saw the patient.  We reviewed the resident's history and exam and pertinent patient test results.  I agree with the assessment, diagnosis, and plan of care documented in the resident's note. 

## 2013-07-29 ENCOUNTER — Telehealth: Payer: Self-pay | Admitting: Internal Medicine

## 2013-07-29 MED ORDER — VALSARTAN 320 MG PO TABS: 320.0000 mg | ORAL_TABLET | Freq: Every day | ORAL | Status: DC

## 2013-07-29 NOTE — Telephone Encounter (Signed)
Spoke with the pt and notified of recs per MW  She verbalized understanding  Rx was sent to pharm  

## 2013-07-29 NOTE — Progress Notes (Signed)
Quick Note:  LMTCB ______ 

## 2013-07-29 NOTE — Telephone Encounter (Signed)
Called [facility] regarding Benicar denial. Merrilee Seashore, Pharmacist, stated pt needs to try two other BP medications before Benicar or PA will need to be obtained. Called [employer] to see what other covered medications could be tried before Benicar. Any "sartan" medication can be tried and also any "sartan-hctz" medication. Please advise on preferred medication.  Allergies  Allergen Reactions  . Vicodin [Hydrocodone-Acetaminophen] Itching

## 2013-07-29 NOTE — Telephone Encounter (Signed)
diovan 320 mg one daily or avapro 150 mg one daily both in generic

## 2013-07-31 ENCOUNTER — Encounter: Payer: Self-pay | Admitting: Internal Medicine

## 2013-07-31 ENCOUNTER — Ambulatory Visit (INDEPENDENT_AMBULATORY_CARE_PROVIDER_SITE_OTHER): Payer: Medicare Other | Admitting: Internal Medicine

## 2013-07-31 ENCOUNTER — Other Ambulatory Visit (INDEPENDENT_AMBULATORY_CARE_PROVIDER_SITE_OTHER): Payer: Medicare Other

## 2013-07-31 VITALS — BP 114/70 | HR 90 | Temp 98.2°F | Ht 61.0 in | Wt 252.0 lb

## 2013-07-31 DIAGNOSIS — R0609 Other forms of dyspnea: Secondary | ICD-10-CM | POA: Diagnosis not present

## 2013-07-31 DIAGNOSIS — D869 Sarcoidosis, unspecified: Secondary | ICD-10-CM | POA: Diagnosis not present

## 2013-07-31 DIAGNOSIS — R0600 Dyspnea, unspecified: Secondary | ICD-10-CM | POA: Insufficient documentation

## 2013-07-31 DIAGNOSIS — R0989 Other specified symptoms and signs involving the circulatory and respiratory systems: Secondary | ICD-10-CM | POA: Diagnosis not present

## 2013-07-31 DIAGNOSIS — J9610 Chronic respiratory failure, unspecified whether with hypoxia or hypercapnia: Secondary | ICD-10-CM | POA: Diagnosis not present

## 2013-07-31 DIAGNOSIS — F17200 Nicotine dependence, unspecified, uncomplicated: Secondary | ICD-10-CM | POA: Diagnosis not present

## 2013-07-31 LAB — CBC WITH DIFFERENTIAL/PLATELET
Basophils Absolute: 0 10*3/uL (ref 0.0–0.1)
Basophils Relative: 0.3 % (ref 0.0–3.0)
Eosinophils Absolute: 0.2 10*3/uL (ref 0.0–0.7)
Eosinophils Relative: 2.6 % (ref 0.0–5.0)
HCT: 27.6 % — ABNORMAL LOW (ref 36.0–46.0)
Hemoglobin: 8.2 g/dL — ABNORMAL LOW (ref 12.0–15.0)
Lymphocytes Relative: 25.5 % (ref 12.0–46.0)
Lymphs Abs: 2.3 10*3/uL (ref 0.7–4.0)
MCHC: 29.8 g/dL — ABNORMAL LOW (ref 30.0–36.0)
MCV: 79.5 fl (ref 78.0–100.0)
Monocytes Absolute: 0.7 10*3/uL (ref 0.1–1.0)
Monocytes Relative: 7.4 % (ref 3.0–12.0)
Neutro Abs: 5.8 10*3/uL (ref 1.4–7.7)
Neutrophils Relative %: 64.2 % (ref 43.0–77.0)
Platelets: 197 10*3/uL (ref 150.0–400.0)
RBC: 3.48 Mil/uL — ABNORMAL LOW (ref 3.87–5.11)
RDW: 17.7 % — ABNORMAL HIGH (ref 11.5–14.6)
WBC: 9 10*3/uL (ref 4.5–10.5)

## 2013-07-31 LAB — BASIC METABOLIC PANEL
BUN: 15 mg/dL (ref 6–23)
CO2: 40 mEq/L — ABNORMAL HIGH (ref 19–32)
Calcium: 8.3 mg/dL — ABNORMAL LOW (ref 8.4–10.5)
Chloride: 92 mEq/L — ABNORMAL LOW (ref 96–112)
Creatinine, Ser: 0.9 mg/dL (ref 0.4–1.2)
GFR: 87.26 mL/min (ref >60.00)
Glucose, Bld: 165 mg/dL — ABNORMAL HIGH (ref 70–99)
Potassium: 3.7 mEq/L (ref 3.5–5.1)
Sodium: 140 mEq/L (ref 135–145)

## 2013-07-31 LAB — BRAIN NATRIURETIC PEPTIDE: Pro B Natriuretic peptide (BNP): 556 pg/mL — ABNORMAL HIGH (ref 0.0–100.0)

## 2013-07-31 LAB — TSH: TSH: 4.94 u[IU]/mL (ref 0.35–5.50)

## 2013-07-31 NOTE — Progress Notes (Signed)
Quick Note:  lmtocb ______

## 2013-07-31 NOTE — Patient Instructions (Signed)
Treatment for your abdominal tightness  consists of avoiding foods that cause gas (especially beans and raw vegetables like spinach and salads boiled eggs)  and citrucel 1 heaping tsp twice daily with a large glass of water.  Pain should improve w/in 2 weeks and if not then consider further GI work up.    Wear 02 4lpm 24/7 and stop all smoking   Please remember to go to the lab   department downstairs for your tests - we will call you with the results when they are available.  Make a food diary for the next two weeks and show it to your clinic doctor/ nutritionist   Please schedule a follow up office visit in 4 weeks, sooner if needed

## 2013-07-31 NOTE — Progress Notes (Signed)
Quick Note:  Spoke with pt and notified of results per Dr. Wert. Pt verbalized understanding and denied any questions.  ______ 

## 2013-07-31 NOTE — Progress Notes (Signed)
Subjective:     Patient ID: Melody Gray, female   DOB: [birth year]   MRN: [id number]   Brief patient profile:  35 yobf  Smoker dx of sarcoidosis in 2001 = sob, cough became 02 dep in July [id number] complicated by [facility]  last on chronic  prednsione 01/2012      History of Present Illness   January 06, 2010 ov  c/o increased SOB. Pt states outpt clinic advised her to follow up with Dr. Melvyn Novas for Sacoidosis. doe on 02 at 2 lpm sitting then 4 with activity gets off at the curb struggles with grocery store. Try taking Prednisone one half even days in am with breakfast  Be sure you take omeprazole Take one 30-60 min before first and last meals of the day  Stop nifedipine  Start Cardizem 240 mg once daily  Wear 24 hours per day, 2 lpm at rest and sleeping, 4lpm with activity, this is the best way to help your heart function better  CT Chest ( repeated with contrast)> no PE, just sarcoid changes   February 09, 2010 Followup sarcoid w/ PFTs. Breathing is the same- no better or worse.on 02 2 lpm w/in and then outside using 4lpm> cc doe x 50 ft still has to stop every aisle at [employer] but not [facility] parking. rec Start Dulera 2 puffs first thing in am and 2 puffs again in pm about 12 hours later and prednisone 10 mg every other day.   May 04, 2010 ov cc doe some better to point to where feels needs 02 less. rec  1) Stay on dulera 200 2 puffs first thing in am and 2 puffs again in pm about 12 hours later  2) Work on perfecting inhaler technique 3) Prednisone 20 mg 1/4 every other day  4) Stay on 02 2lpm 24 hours per day except increase to 4lpm with exercise  5) Weight control t thru ex      02/12/2012 still smoking f/u ov/Wert much better, no cough off acei cc no change doe @ prednisone q 3 days, walking 15 min on 02 - no change on days when misses prednisone.   rec Try off prednisone completely - call if breathing worsens or cough starts back or nausea/ weakness develop    04/08/2013 f/u ov/Wert  re: sarcoid doe x one aisle at grocery, not wearing 02 with activity Chief Complaint  Patient presents with  . Follow-up    Last seen Nov 2013. Pt reports her breathing is unchanged since her last visit. No new co's today.   rec Wt loss/ wear 02 with activity to achieve neg cal balance.    Date of Admission: 07/19/2013   Date of Discharge: 07/21/2013   Discharge Diagnosis:  Principal Problem:  Community acquired pneumonia  Active Problems:  Sarcoidosis  Diabetes mellitus type II, controlled  OBESITY, MORBID  Anemia in chronic kidney disease(285.21)  NICOTINE ADDICTION  HYPERTENSION  CONGESTIVE HEART FAILURE  Respiratory failure, chronic  GERD  Pulmonary hypertension associated with sarcoidosis  Cardiomyopathy  CAP (community acquired pneumonia)   Hospital Course by problem list:  1. Dyspnea on exertion, likely 2/2 to CAP and Sarcoidosis flare - Main issue is dyspnea on exertion. She feels well at rest. XR showed mild patchy right upper lobe opacity, suspicious for pneumonia, which correlates with soft crackles on exam. Strep pneumo/legionella urine antigens negative. Flu panel negative. PFTs 07/13 suggested mixed obstructive/restrictive disease, but her pulmonologist does not feel she has COPD. (Per Dr.  Wert's note 3 months ago, the patient's symptoms including DOE at that time seemed 2/2 to HF and obesity.) Mild wheezing was noted on her exam. She has a history of sarcoidosis with last flare in 2013. We treated for both with IV ceftriaxone and azithromycin, Prednisone 40mg  daily (she got Solu-Medrol 125 IV in ED), and Duonebs q6h. On hosptial day 2 she was feeling better, so we converted her to antibiotic monotherapy with Azithromycin 250mg  po. PT worked with her and recommended pulmonary rehabilitation. She was noted to require 3L/min supplemental O2 throughout session, so we involved care management to increase her home O2 rate (she used 2L prior to admission). Close follow up was  arranged with Dr. Melvyn Novas to arrange for pulmonary rehab and taper steroids.  2. Chronic diastolic heart failure: ECHO in Nov 2013 - EF 45-50% with grade 1 diastolic dysfunction. No evidence of acute exacerbation - no/trace lower extremity edema, wt unchanged, PND and orthopnea stable. DOE seems 2/2 to sarcoid and acute infection (CAP). We felt no need for repeat echo. We continued Lasix 40mg  daily, Coreg 25mg  BID.  3. CKD2: Baseline Cr 0.6-0.8. Cr 1.11 at admission. Downtrending this am. Repeat BMP as outpatient.    07/24/2013 post hosp f/u ov/Wert re: ? Sarcoid flare/ still smoking  Chief Complaint  Patient presents with  . HFU    Pt states that her breathing is no better since hospital d/c. She reports having to use her o2 all of the time. She has minimal non prod cough.   onset wasnot  abrupt, sob developed gradually  in setting of gen chest/ upper abd tight sensation anteriorly, sym bilaterally better lying down, not much cough all, no better back on prednisone rec Please see patient coordinator before you leave today  to schedule CTa chest> ? LL PE vs artifact > bialteral venous dopplers > neg / D Dimer nl  Treatment for your abdominal tightness  consists of avoiding foods that cause gas (especially beans and raw vegetables like spinach and salads boiled eggs)  and citrucel 1 heaping tsp twice daily with a large glass of water.  Pain should improve w/in 2 weeks and if not then consider further GI work up.   Wear 02 4lpm 24/7 and stop all smoking  Prednisone 20 mg one daily x 3 days, half daily x 3 days and stop   07/31/2013 f/u ov/Wert re: last pred one day  prior to [place]  Patient presents with  . Follow-up    Pt reports her breathing is starting to improve. Still has minimal non prod cough. No new co's today.  did not start citrucel as rec and main c/o is abd tightness s change in bowel habits  No obvious daytime variabilty or assoc pleurritic/ex cp or  subjective wheeze  overt sinus or hb symptoms. No unusual exp hx or h/o childhood pna/ asthma or premature birth to his knowledge.    Sleeping ok on 02 without nocturnal  or early am exacerbation  of respiratory  c/o's or need for noct saba. Also denies any obvious fluctuation of symptoms with weather or environmental changes or other aggravating or alleviating factors except as outlined above   ROS  The following are not active complaints unless bolded sore throat, dysphagia, dental problems, itching, sneezing,  nasal congestion or excess/ purulent secretions, ear ache,   fever, chills, sweats, unintended wt loss, pleuritic or exertional cp, hemoptysis,  orthopnea pnd or leg swelling, presyncope, palpitations, heartburn, abdominal pain,  anorexia, nausea, vomiting, diarrhea  or change in bowel or urinary habits, change in stools or urine, dysuria,hematuria,  rash, arthralgias, visual complaints, headache, numbness weakness or ataxia or problems with walking or coordination,  change in mood/affect or memory.         Past Medical History:  Sarcoidosis (Dr Wert)-w/ liver involvement per biopsy 03/2008 - Reversible airway component so start [facility] 01/2010 > better  - HFa 75% p coaching May 04, 2010  -  Off chronic prednisone 05/2011 Unexplained Hypoxemia July 2011  - CT angiogram 01/07/10 >>> no PE  - PFT's February 09, 2010 FEV1 1.20 (49%) with ratio 72 and FRC 90%, FEV1  16% better p B2, DLC0 33% > corrects to 84   Morbid obesity  - Target wt = 153 for BMI < 30  QT prolongation  Diabetes mellitus, type II  Hypertension  Heart catheterization 06-06-06 : No CAD, no RAS, normal EF  Hx eclampsia  Hx of scalp seborrheic dermatitis            Objective:   Physical Exam She is a minimally  cushingoid-appearing amb bf nad on 4lpm in w/c  wt 238 January 06, 2010 >   217 May 04, 2010 >  02/12/2012  213 >  215 04/04/2012  > 239 04/08/2013 > 07/24/2013 249 > 07/31/2013  252 There is no stigmata liver disease   skin: anicteric  HEENT: normocephalic; PEERLA; no nasal or pharyngeal abnormalities  neck: supple  nodes: no cervical lymphadenopathy  chest: clear to ausculatation and percussion  heart: no murmurs, gallops, or rubs  abd: soft, nontender; BS normoactive; no abdominal masses, tenderness, organomegaly; abdomen is obese         Recent Labs Lab 07/31/13 0948  NA 140  K 3.7  CL 92*  CO2 40*  BUN 15  CREATININE 0.9  GLUCOSE 165*   No results found for this basename: HGB, HCT, WBC, PLT,  in the last 168 hours    Lab Results  Component Value Date   PROBNP 556.0* 07/31/2013     Lab Results  Component Value Date   TSH 4.94 07/31/2013     D dimer 07/31/13 wnl      Assessment:

## 2013-08-01 LAB — D-DIMER, QUANTITATIVE: D-Dimer, Quant: 0.46 ug/mL-FEU (ref 0.00–0.48)

## 2013-08-02 NOTE — Assessment & Plan Note (Signed)
Symptoms are markedly disproportionate to objective findings and not clear this is a lung problem but pt does appear to have difficult airway management issues. DDX of  difficult airways managment all start with A and  include Adherence, Ace Inhibitors, Acid Reflux, Active Sinus Disease, Alpha 1 Antitripsin deficiency, Anxiety masquerading as Airways dz,  ABPA,  allergy(esp in young), Aspiration (esp in elderly), Adverse effects of DPI,  Active smokers, plus two Bs  = Bronchiectasis and Beta blocker use..and one C= CHF  Adherence is always the initial "prime suspect" and is a multilayered concern that requires a "trust but verify" approach in every patient - starting with knowing how to use medications, especially inhalers, correctly, keeping up with refills and understanding the fundamental difference between maintenance and prns vs those medications only taken for a very short course and then stopped and not refilled.   Active smoking discussed separately   ? CHF > note bnp trending down  ? BB effects > she does have mild airflow obst and may consider Strongly prefer in this setting: Bystolic, the most beta -1  selective Beta blocker available in sample form, with bisoprolol the most selective generic choice  on the market rather than escalate pulmonary meds   See instructions for specific recommendations which were reviewed directly with the patient who was given a copy with highlighter outlining the key components.

## 2013-08-02 NOTE — Assessment & Plan Note (Signed)

## 2013-08-02 NOTE — Assessment & Plan Note (Signed)
-    Complicated by PAH - Walked 3 laps @ 185 ft each stopped due to  End of test, no desat on 4lpm 10/05/2010  - 02/02/2011  Walked RA  2 laps @ 185 ft each stopped due to  desat to 88 so change rx to 2lpm with activity outside the house and at hs - 08/21/2011  Walked RA x 1 laps @ 185 ft each stopped due to desat 85% - 11/21/2011  Walked RA  2 laps @ 185 ft each stopped due to  Sat 88 -   ONO RA < 89% x 6h 26 min 03/19/12 > repeat on 2lpm 04/05/12 > desats resolved - 04/08/2013   Walked RA x one lap @ 185 stopped due to  sats 85%  - HCO3 = 40 07/31/13 c/w hypercarbia  rx is 4lpm 24/7 as of 07/31/13  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

## 2013-08-02 NOTE — Assessment & Plan Note (Addendum)
-   PFT's 11/21/2011 FEV1 1.23 (50%) ratio 67 and no better p B2,  DLCO 31 corrects to 73   - trial off chronic prednisone 02/12/2012   No evidence of sign sarcoid activty as of most recent CT > no need for pred

## 2013-08-03 ENCOUNTER — Other Ambulatory Visit: Payer: Self-pay | Admitting: Cardiology

## 2013-08-04 ENCOUNTER — Encounter: Payer: Self-pay | Admitting: Internal Medicine

## 2013-08-04 ENCOUNTER — Telehealth: Payer: Self-pay | Admitting: *Deleted

## 2013-08-04 ENCOUNTER — Ambulatory Visit (INDEPENDENT_AMBULATORY_CARE_PROVIDER_SITE_OTHER): Payer: Medicare Other | Admitting: Internal Medicine

## 2013-08-04 VITALS — BP 113/74 | HR 106 | Temp 98.2°F | Ht 61.0 in | Wt 254.2 lb

## 2013-08-04 DIAGNOSIS — R040 Epistaxis: Secondary | ICD-10-CM

## 2013-08-04 DIAGNOSIS — R141 Gas pain: Secondary | ICD-10-CM | POA: Diagnosis not present

## 2013-08-04 DIAGNOSIS — I2789 Other specified pulmonary heart diseases: Secondary | ICD-10-CM | POA: Diagnosis not present

## 2013-08-04 DIAGNOSIS — K5900 Constipation, unspecified: Secondary | ICD-10-CM | POA: Diagnosis not present

## 2013-08-04 DIAGNOSIS — I2729 Other secondary pulmonary hypertension: Secondary | ICD-10-CM

## 2013-08-04 DIAGNOSIS — I10 Essential (primary) hypertension: Secondary | ICD-10-CM | POA: Diagnosis not present

## 2013-08-04 DIAGNOSIS — R143 Flatulence: Secondary | ICD-10-CM | POA: Diagnosis not present

## 2013-08-04 DIAGNOSIS — R109 Unspecified abdominal pain: Secondary | ICD-10-CM | POA: Diagnosis not present

## 2013-08-04 DIAGNOSIS — R142 Eructation: Secondary | ICD-10-CM | POA: Diagnosis not present

## 2013-08-04 DIAGNOSIS — K5909 Other constipation: Secondary | ICD-10-CM | POA: Diagnosis not present

## 2013-08-04 DIAGNOSIS — F17200 Nicotine dependence, unspecified, uncomplicated: Secondary | ICD-10-CM | POA: Diagnosis not present

## 2013-08-04 DIAGNOSIS — D869 Sarcoidosis, unspecified: Secondary | ICD-10-CM

## 2013-08-04 LAB — CBC
HCT: 30 % — ABNORMAL LOW (ref 36.0–46.0)
Hemoglobin: 8.4 g/dL — ABNORMAL LOW (ref 12.0–15.0)
MCH: 22.8 pg — ABNORMAL LOW (ref 26.0–34.0)
MCHC: 28 g/dL — ABNORMAL LOW (ref 30.0–36.0)
MCV: 81.3 fL (ref 78.0–100.0)
Platelets: 225 10*3/uL (ref 150–400)
RBC: 3.69 MIL/uL — ABNORMAL LOW (ref 3.87–5.11)
RDW: 17.3 % — ABNORMAL HIGH (ref 11.5–15.5)
WBC: 10.3 10*3/uL (ref 4.0–10.5)

## 2013-08-04 LAB — COMPLETE METABOLIC PANEL WITH GFR
ALT: 24 U/L (ref 0–35)
AST: 26 U/L (ref 0–37)
Albumin: 3.4 g/dL — ABNORMAL LOW (ref 3.5–5.2)
Alkaline Phosphatase: 91 U/L (ref 39–117)
BUN: 16 mg/dL (ref 6–23)
CO2: 38 mEq/L — ABNORMAL HIGH (ref 19–32)
Calcium: 8.5 mg/dL (ref 8.4–10.5)
Chloride: 93 mEq/L — ABNORMAL LOW (ref 96–112)
Creat: 1.1 mg/dL (ref 0.50–1.10)
GFR, Est African American: 71 mL/min
GFR, Est Non African American: 61 mL/min
Glucose, Bld: 185 mg/dL — ABNORMAL HIGH (ref 70–99)
Potassium: 4.6 mEq/L (ref 3.5–5.3)
Sodium: 139 mEq/L (ref 135–145)
Total Bilirubin: 0.6 mg/dL (ref 0.2–1.2)
Total Protein: 6.4 g/dL (ref 6.0–8.3)

## 2013-08-04 MED ORDER — IRBESARTAN 150 MG PO TABS: 300.0000 mg | ORAL_TABLET | Freq: Every day | ORAL | Status: DC

## 2013-08-04 NOTE — Progress Notes (Signed)
INTERNAL MEDICINE TEACHING ATTENDING ADDENDUM - Nischal Narendra, MD: I reviewed and discussed at the time of visit with the resident Dr. Chikowski, the patient's medical history, physical examination, diagnosis and results of tests and treatment and I agree with the patient's care as documented. 

## 2013-08-04 NOTE — Assessment & Plan Note (Signed)
Patient with c/o vague diffuse abdominal "tightness", though no other GI complaints today. VSS, mild tachycardia does not look to be atypical for her per chart review. She does have h/o abdominal surgeries, though she is passing normal stool and flatus with a benign abdominal exam so SBO very unlikely. No diarrhea, so less likely to be infectious in origin. Patient with chronic shortness of breath with recent increased O2 requirement, I am curious if her abdominal symptoms are related to worsening breathing status as this can definitely cause early satiety. Will check CMP today to check liver function. Will d/c diovan as pt believes this may be related to her abdominal symptoms. Diovan can cause GI upset, though without other GI symptoms I am less inclined to believe this to be the cause of her symptoms. I asked patient to follow up in clinic in 1 week if her symptoms do not improve or if they worsen.

## 2013-08-04 NOTE — Progress Notes (Signed)
Patient ID: Melody Gray, female   DOB: 11/13/1970, 43 y.o.   MRN: [id number] HPI The patient is a 43 y.o. 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  Patient c/o have abd tightness and early satiety x 2 weeks. Normal bowel movements during this time. No N/V/D, constipation, melena, hematochezia, dysuria, F/C, night sweats, weight loss, CP. She is SOB at baseline, no changes recently. Patient does endorse having some lightheadedness intermittently at home over the past week. She also endorses having 3 nose bleeds over the last 24hrs. She has hx of epistaxis and was treated with embolization 1-2 years ago at [facility]. Patient has not had episodes of nosebleeds since her surgery. She was admitted to the [facility] from 3/21-3/23 for treatment of CAP w/ superimposed sarcoid flare. At time of admission she had been on 2LPM O2 at home, at time of discharge she required 3LPM. Patient saw her pulmonologist, Dr. Melvyn Novas, on 4/2 at which time he increased her oxygen requirement to 4LPM. Increased O2 need thought to be combination of morbid obesity, PAH, sarcoidosis, and continued tobacco abuse. Patient was also started on valsartan by Dr. Melvyn Novas 2 weeks ago. She thinks this medication is causing her side effects of abd tightness and lightheadedness and she desires switching to a different medication.   ROS: General: no fevers, chills Skin: no rash HEENT: no vision changes, ST, HA Pulm: SOB at baseline on 4LPM O2 per [place] CV: no chest pain, BLE swelling Abd: see HPI GU: no dysuria Ext: no arthralgias Neuro: no weakness, numbness, or tingling  Filed Vitals:   08/04/13 1351  BP: 113/74  Pulse: 106  Temp: 98.2 F (36.8 C)  SpO2 97% on 4LPM [place]  Physical Exam: General: alert, cooperative, and in no apparent distress HEENT: pupils  equal round and reactive to light, vision grossly intact, oropharynx clear and non-erythematous  Neck: supple Lungs: faint crackles to bilateral bases otherwise CTAB, normal work of respiration, no accessory muscle use Heart: regular rate and rhythm, no murmurs, gallops, or rubs Abdomen: morbidly obese, soft, non-tender, non-distended, normal bowel sounds, no guarding or rebound tenderness  Extremities: warm, no pedal edema Neurologic: alert & oriented X3, cranial nerves II-XII grossly intact, strength grossly intact, sensation intact to light touch  Current Outpatient Prescriptions on File Prior to Visit  Medication Sig Dispense Refill  . carvedilol (COREG) 25 MG tablet take 1 tablet by mouth twice a day with food  60 tablet  0  . furosemide (LASIX) 40 MG tablet take 1 tablet by mouth once daily  90 tablet  2  . insulin aspart protamine- aspart (NOVOLOG MIX 70/30) (70-30) 100 UNIT/ML injection Inject 0.25 mLs (25 Units total) into the skin 2 (two) times daily with a meal. Taking 25 in AM and 23 in PM  10 mL  3  . metFORMIN (GLUCOPHAGE) 1000 MG tablet Take 1 tablet (1,000 mg total) by mouth 2 (two) times daily with a meal.  180 tablet  4  . omeprazole (PRILOSEC) 40 MG capsule take 1 capsule by mouth once daily  90 capsule  5  . valsartan (DIOVAN) 320 MG tablet Take 1 tablet (320 mg total) by mouth daily.  90 tablet  3   No current facility-administered medications on file prior to visit.    Assessment/Plan

## 2013-08-04 NOTE — Progress Notes (Signed)
Quick Note:  lmtcb ______ 

## 2013-08-04 NOTE — Patient Instructions (Signed)
Thank you for your visit.  I submitted a referral to ENT for your nose bleeds. They will call you with an appointment date/time. If you do not receive a call within a few days, please call our office.  We stopped your diovan today and started a new medication in the same "family" of medications called irbesartan. This will be taken 300mg  once per day. Please check your blood pressure at home. If you become dizzy or feel lightheaded with standing, please check your blood pressure to make sure it is not too low (less than 110/70).  We also checked some basic lab work today, CBC and CMP. We will call with any abnormalities.  Follow up in 1 week if symptoms have not resolved.

## 2013-08-04 NOTE — Telephone Encounter (Signed)
Pt emails dr Eula Fried c/o distended abdomen for several days, states gi dr told her to drink metamucil or citracil, has had a glass last pm and this am, no relief. States unable to eat, abdomen is tight and causing some difficulty with resp., she is able to carry on conversation with no distress. appt this pm at 1330 dr Mechele Claude

## 2013-08-04 NOTE — Assessment & Plan Note (Signed)
Patient's blood pressure is well controlled on diovan and lasix. However, given we do not have a better explanation for her abdominal pain and the addition of this medication correlates temporally with her symptoms of lightheadedness and abd "tightness" we will stop this medication today and start irbesartan 300mg  daily. I am unsure if this medication will be covered by her insurance, however I asked patient to call our office if it is not so we can arrange for another solution to this problem. Patient does check her blood pressures at home and I asked that she call the clinic if her lightheadedness worsens (especially with standing) and if her blood pressure at home is <110/60.

## 2013-08-04 NOTE — Assessment & Plan Note (Addendum)
Patient has recent hx of up titration of her oxygen flow. She does have humidifier at home, which she uses. I suspect recent episodes are due to her increase oxygen flow over the last few weeks. She is not on any blood thinners. However, given patient has had 3 episodes in 24 hours and her complicated hx of prior frequent epistaxis requiring ENT intervention, will refer to ENT again today. Checking CBC as well, though bleeding sounds minimal and I do not suspect patient will have Hb drop.

## 2013-08-06 NOTE — Progress Notes (Signed)
Quick Note:  Pt aware ______ 

## 2013-08-08 ENCOUNTER — Emergency Department (HOSPITAL_COMMUNITY): Payer: Medicare Other

## 2013-08-08 ENCOUNTER — Telehealth: Payer: Self-pay | Admitting: Internal Medicine

## 2013-08-08 ENCOUNTER — Encounter (HOSPITAL_COMMUNITY): Payer: Self-pay | Admitting: Emergency Medicine

## 2013-08-08 ENCOUNTER — Other Ambulatory Visit: Payer: Self-pay

## 2013-08-08 ENCOUNTER — Inpatient Hospital Stay (HOSPITAL_COMMUNITY)
Admission: EM | Admit: 2013-08-08 | Discharge: 2013-08-15 | DRG: 003 | Disposition: A | Discharge status: Other Acute Inpatient Hospital | Payer: Medicare Other | Attending: Critical Care Medicine | Admitting: Critical Care Medicine

## 2013-08-08 DIAGNOSIS — R918 Other nonspecific abnormal finding of lung field: Secondary | ICD-10-CM | POA: Diagnosis not present

## 2013-08-08 DIAGNOSIS — J9600 Acute respiratory failure, unspecified whether with hypoxia or hypercapnia: Secondary | ICD-10-CM | POA: Diagnosis not present

## 2013-08-08 DIAGNOSIS — D869 Sarcoidosis, unspecified: Secondary | ICD-10-CM

## 2013-08-08 DIAGNOSIS — E872 Acidosis, unspecified: Secondary | ICD-10-CM | POA: Diagnosis not present

## 2013-08-08 DIAGNOSIS — J811 Chronic pulmonary edema: Secondary | ICD-10-CM | POA: Diagnosis not present

## 2013-08-08 DIAGNOSIS — E662 Morbid (severe) obesity with alveolar hypoventilation: Secondary | ICD-10-CM | POA: Diagnosis present

## 2013-08-08 DIAGNOSIS — I509 Heart failure, unspecified: Secondary | ICD-10-CM | POA: Diagnosis present

## 2013-08-08 DIAGNOSIS — J9620 Acute and chronic respiratory failure, unspecified whether with hypoxia or hypercapnia: Secondary | ICD-10-CM | POA: Diagnosis present

## 2013-08-08 DIAGNOSIS — N039 Chronic nephritic syndrome with unspecified morphologic changes: Secondary | ICD-10-CM

## 2013-08-08 DIAGNOSIS — D509 Iron deficiency anemia, unspecified: Secondary | ICD-10-CM | POA: Diagnosis present

## 2013-08-08 DIAGNOSIS — Z930 Tracheostomy status: Secondary | ICD-10-CM | POA: Diagnosis not present

## 2013-08-08 DIAGNOSIS — N182 Chronic kidney disease, stage 2 (mild): Secondary | ICD-10-CM | POA: Diagnosis present

## 2013-08-08 DIAGNOSIS — G4733 Obstructive sleep apnea (adult) (pediatric): Secondary | ICD-10-CM | POA: Diagnosis present

## 2013-08-08 DIAGNOSIS — E11319 Type 2 diabetes mellitus with unspecified diabetic retinopathy without macular edema: Secondary | ICD-10-CM | POA: Diagnosis present

## 2013-08-08 DIAGNOSIS — I10 Essential (primary) hypertension: Secondary | ICD-10-CM | POA: Diagnosis not present

## 2013-08-08 DIAGNOSIS — N179 Acute kidney failure, unspecified: Secondary | ICD-10-CM | POA: Diagnosis not present

## 2013-08-08 DIAGNOSIS — I2789 Other specified pulmonary heart diseases: Secondary | ICD-10-CM | POA: Diagnosis not present

## 2013-08-08 DIAGNOSIS — I5033 Acute on chronic diastolic (congestive) heart failure: Principal | ICD-10-CM | POA: Diagnosis present

## 2013-08-08 DIAGNOSIS — I428 Other cardiomyopathies: Secondary | ICD-10-CM | POA: Diagnosis present

## 2013-08-08 DIAGNOSIS — Z87891 Personal history of nicotine dependence: Secondary | ICD-10-CM | POA: Diagnosis not present

## 2013-08-08 DIAGNOSIS — R9431 Abnormal electrocardiogram [ECG] [EKG]: Secondary | ICD-10-CM

## 2013-08-08 DIAGNOSIS — R0602 Shortness of breath: Secondary | ICD-10-CM | POA: Diagnosis not present

## 2013-08-08 DIAGNOSIS — J449 Chronic obstructive pulmonary disease, unspecified: Secondary | ICD-10-CM | POA: Diagnosis present

## 2013-08-08 DIAGNOSIS — S128XXA Fracture of other parts of neck, initial encounter: Secondary | ICD-10-CM | POA: Diagnosis not present

## 2013-08-08 DIAGNOSIS — Z9981 Dependence on supplemental oxygen: Secondary | ICD-10-CM | POA: Diagnosis not present

## 2013-08-08 DIAGNOSIS — R0609 Other forms of dyspnea: Secondary | ICD-10-CM | POA: Diagnosis not present

## 2013-08-08 DIAGNOSIS — D631 Anemia in chronic kidney disease: Secondary | ICD-10-CM | POA: Diagnosis present

## 2013-08-08 DIAGNOSIS — N049 Nephrotic syndrome with unspecified morphologic changes: Secondary | ICD-10-CM

## 2013-08-08 DIAGNOSIS — E1139 Type 2 diabetes mellitus with other diabetic ophthalmic complication: Secondary | ICD-10-CM

## 2013-08-08 DIAGNOSIS — I2729 Other secondary pulmonary hypertension: Secondary | ICD-10-CM

## 2013-08-08 DIAGNOSIS — I429 Cardiomyopathy, unspecified: Secondary | ICD-10-CM

## 2013-08-08 DIAGNOSIS — R040 Epistaxis: Secondary | ICD-10-CM

## 2013-08-08 DIAGNOSIS — I959 Hypotension, unspecified: Secondary | ICD-10-CM | POA: Diagnosis not present

## 2013-08-08 DIAGNOSIS — D376 Neoplasm of uncertain behavior of liver, gallbladder and bile ducts: Secondary | ICD-10-CM

## 2013-08-08 DIAGNOSIS — I369 Nonrheumatic tricuspid valve disorder, unspecified: Secondary | ICD-10-CM | POA: Diagnosis not present

## 2013-08-08 DIAGNOSIS — R768 Other specified abnormal immunological findings in serum: Secondary | ICD-10-CM

## 2013-08-08 DIAGNOSIS — K219 Gastro-esophageal reflux disease without esophagitis: Secondary | ICD-10-CM | POA: Diagnosis present

## 2013-08-08 DIAGNOSIS — J90 Pleural effusion, not elsewhere classified: Secondary | ICD-10-CM | POA: Diagnosis not present

## 2013-08-08 DIAGNOSIS — Z6841 Body Mass Index (BMI) 40.0 and over, adult: Secondary | ICD-10-CM

## 2013-08-08 DIAGNOSIS — J9819 Other pulmonary collapse: Secondary | ICD-10-CM | POA: Diagnosis not present

## 2013-08-08 DIAGNOSIS — E8770 Fluid overload, unspecified: Secondary | ICD-10-CM

## 2013-08-08 DIAGNOSIS — E875 Hyperkalemia: Secondary | ICD-10-CM

## 2013-08-08 DIAGNOSIS — R1313 Dysphagia, pharyngeal phase: Secondary | ICD-10-CM | POA: Diagnosis not present

## 2013-08-08 DIAGNOSIS — Z87898 Personal history of other specified conditions: Secondary | ICD-10-CM | POA: Diagnosis present

## 2013-08-08 DIAGNOSIS — Z794 Long term (current) use of insulin: Secondary | ICD-10-CM

## 2013-08-08 DIAGNOSIS — R0989 Other specified symptoms and signs involving the circulatory and respiratory systems: Secondary | ICD-10-CM | POA: Diagnosis not present

## 2013-08-08 DIAGNOSIS — R109 Unspecified abdominal pain: Secondary | ICD-10-CM

## 2013-08-08 DIAGNOSIS — J9610 Chronic respiratory failure, unspecified whether with hypoxia or hypercapnia: Secondary | ICD-10-CM

## 2013-08-08 DIAGNOSIS — J4489 Other specified chronic obstructive pulmonary disease: Secondary | ICD-10-CM | POA: Diagnosis present

## 2013-08-08 DIAGNOSIS — J9612 Chronic respiratory failure with hypercapnia: Secondary | ICD-10-CM | POA: Diagnosis present

## 2013-08-08 DIAGNOSIS — N19 Unspecified kidney failure: Secondary | ICD-10-CM | POA: Diagnosis not present

## 2013-08-08 DIAGNOSIS — N189 Chronic kidney disease, unspecified: Secondary | ICD-10-CM | POA: Diagnosis present

## 2013-08-08 DIAGNOSIS — J95821 Acute postprocedural respiratory failure: Secondary | ICD-10-CM | POA: Diagnosis not present

## 2013-08-08 DIAGNOSIS — Z862 Personal history of diseases of the blood and blood-forming organs and certain disorders involving the immune mechanism: Secondary | ICD-10-CM

## 2013-08-08 DIAGNOSIS — F17200 Nicotine dependence, unspecified, uncomplicated: Secondary | ICD-10-CM | POA: Diagnosis present

## 2013-08-08 DIAGNOSIS — E119 Type 2 diabetes mellitus without complications: Secondary | ICD-10-CM | POA: Diagnosis not present

## 2013-08-08 DIAGNOSIS — R0600 Dyspnea, unspecified: Secondary | ICD-10-CM

## 2013-08-08 DIAGNOSIS — R579 Shock, unspecified: Secondary | ICD-10-CM | POA: Diagnosis not present

## 2013-08-08 LAB — BASIC METABOLIC PANEL
BUN: 51 mg/dL — ABNORMAL HIGH (ref 6–23)
CO2: 32 mEq/L (ref 19–32)
Calcium: 9.3 mg/dL (ref 8.4–10.5)
Chloride: 97 mEq/L (ref 96–112)
Creatinine, Ser: 2.53 mg/dL — ABNORMAL HIGH (ref 0.50–1.10)
GFR calc Af Amer: 25 mL/min — ABNORMAL LOW (ref >90)
GFR calc non Af Amer: 22 mL/min — ABNORMAL LOW (ref >90)
Glucose, Bld: 118 mg/dL — ABNORMAL HIGH (ref 70–99)
Potassium: 4.8 mEq/L (ref 3.7–5.3)
Sodium: 143 mEq/L (ref 137–147)

## 2013-08-08 LAB — CBC
HCT: 26.7 % — ABNORMAL LOW (ref 36.0–46.0)
Hemoglobin: 7.5 g/dL — ABNORMAL LOW (ref 12.0–15.0)
MCH: 23.7 pg — ABNORMAL LOW (ref 26.0–34.0)
MCHC: 28.1 g/dL — ABNORMAL LOW (ref 30.0–36.0)
MCV: 84.5 fL (ref 78.0–100.0)
Platelets: 182 10*3/uL (ref 150–400)
RBC: 3.16 MIL/uL — ABNORMAL LOW (ref 3.87–5.11)
RDW: 19.4 % — ABNORMAL HIGH (ref 11.5–15.5)
WBC: 8.6 10*3/uL (ref 4.0–10.5)

## 2013-08-08 LAB — I-STAT TROPONIN, ED: Troponin i, poc: 0.06 ng/mL (ref 0.00–0.08)

## 2013-08-08 LAB — MRSA PCR SCREENING: MRSA by PCR: NEGATIVE

## 2013-08-08 LAB — PRO B NATRIURETIC PEPTIDE: Pro B Natriuretic peptide (BNP): 5862 pg/mL — ABNORMAL HIGH (ref 0–125)

## 2013-08-08 LAB — GLUCOSE, CAPILLARY: Glucose-Capillary: 123 mg/dL — ABNORMAL HIGH (ref 70–99)

## 2013-08-08 MED ORDER — CARVEDILOL 25 MG PO TABS
25.0000 mg | ORAL_TABLET | Freq: Two times a day (BID) | ORAL | Status: DC
  Administered 2013-08-09: 25 mg via ORAL
  Filled 2013-08-08 (×3): qty 1

## 2013-08-08 MED ORDER — INSULIN ASPART 100 UNIT/ML ~~LOC~~ SOLN
0.0000 [IU] | Freq: Three times a day (TID) | SUBCUTANEOUS | Status: DC
  Administered 2013-08-09: 3 [IU] via SUBCUTANEOUS
  Administered 2013-08-09 – 2013-08-10 (×3): 2 [IU] via SUBCUTANEOUS

## 2013-08-08 MED ORDER — INSULIN GLARGINE 100 UNIT/ML ~~LOC~~ SOLN
5.0000 [IU] | Freq: Every day | SUBCUTANEOUS | Status: DC
  Administered 2013-08-08 – 2013-08-14 (×7): 5 [IU] via SUBCUTANEOUS
  Filled 2013-08-08 (×8): qty 0.05

## 2013-08-08 MED ORDER — FUROSEMIDE 10 MG/ML IJ SOLN
80.0000 mg | Freq: Two times a day (BID) | INTRAMUSCULAR | Status: DC
  Administered 2013-08-08 – 2013-08-09 (×3): 80 mg via INTRAVENOUS
  Filled 2013-08-08 (×5): qty 8

## 2013-08-08 MED ORDER — IRBESARTAN 300 MG PO TABS
300.0000 mg | ORAL_TABLET | Freq: Every day | ORAL | Status: DC
  Administered 2013-08-08 – 2013-08-09 (×2): 300 mg via ORAL
  Filled 2013-08-08 (×2): qty 1

## 2013-08-08 MED ORDER — HEPARIN SODIUM (PORCINE) 5000 UNIT/ML IJ SOLN
5000.0000 [IU] | Freq: Three times a day (TID) | INTRAMUSCULAR | Status: DC
  Administered 2013-08-08 – 2013-08-15 (×21): 5000 [IU] via SUBCUTANEOUS
  Filled 2013-08-08 (×23): qty 1

## 2013-08-08 MED ORDER — INSULIN ASPART 100 UNIT/ML ~~LOC~~ SOLN: 0.0000 [IU] | SUBCUTANEOUS | Status: DC

## 2013-08-08 MED ORDER — ASPIRIN EC 81 MG PO TBEC
81.0000 mg | DELAYED_RELEASE_TABLET | Freq: Every day | ORAL | Status: DC
  Administered 2013-08-08 – 2013-08-15 (×8): 81 mg via ORAL
  Filled 2013-08-08 (×9): qty 1

## 2013-08-08 MED ORDER — PANTOPRAZOLE SODIUM 40 MG PO TBEC
40.0000 mg | DELAYED_RELEASE_TABLET | Freq: Every day | ORAL | Status: DC
  Administered 2013-08-09 – 2013-08-10 (×2): 40 mg via ORAL
  Filled 2013-08-08 (×2): qty 1

## 2013-08-08 NOTE — ED Notes (Signed)
Diet tray ordered 

## 2013-08-08 NOTE — ED Provider Notes (Signed)
CSN: [id number]     Arrival date & time 08/08/13  1253 History   First MD Initiated Contact with Patient 08/08/13 1323     Chief Complaint  Patient presents with  . Edema     (Consider location/radiation/quality/duration/timing/severity/associated sxs/prior Treatment) HPI Comments: Patient is a 43 year old female with history of diabetes and sarcoidosis on home oxygen. She presents with complaints of worsening dyspnea and weight gain over the past month. She is finding it difficult to eat, ambulate, and sleep. She has seen her pulmonologist who is hesitant to increase her Lasix due to renal insufficiency.  Patient is a 43 y.o. female presenting with shortness of breath. The history is provided by the patient.  Shortness of Breath Severity:  Moderate Onset quality:  Gradual Duration:  1 month Timing:  Constant Progression:  Worsening Chronicity:  Chronic Context: activity   Relieved by:  Nothing Worsened by:  Nothing tried Ineffective treatments:  None tried Associated symptoms: no chest pain, no cough and no fever     Past Medical History  Diagnosis Date  . Sarcoidosis     Followed by Dr. Melvyn Novas; w/ liver involvement per biopsy 12/09, Reversible airway component so started on [facility] 01/2010; HFA 75% p coaching 05/2010  . Hypoxemia     CT angiogram 9/11>> No PE; PFTs 10/11- FEV1 1.20 (49%) with 16% better p B2, DLCO 33%> corrects to 84; O2 sats ok on 4 lpm X rapid walk X 3 laps 05/2010  . Morbid obesity     Target wt= 153 for BMI <30  . QT prolongation   . Diabetes mellitus   . Hypertension   . Hx of cardiac cath 2/08    No CAD, no RAS,  normal EF  . Seborrheic dermatitis of scalp   . 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 Hep B serology neg. but Hep C ab +, HIV neg. 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 Cardiomyopathy, nonischemic     EF 45% 12/10; Echo 7/11 normal EF, PAS 48  . Diabetic  retinopathy     Right eye 2/11  . Health maintenance examination     Mammogram 05/2010 Negative; Last Pap smear 03/2008; Last DM eye exam 2/11> mild non-proliferative diabetic retinopathy. OD  . Helicobacter pylori ab+ 05/2011    Pt was symptomatic and treatment planned for 05/2011  . CHF (congestive heart failure)    Past Surgical History  Procedure Laterality Date  . Tubal ligation    . Breast surgery    . Cesarean section     Family History  Problem Relation Age of Onset  . Cancer Mother     colon cancer  . Multiple sclerosis Father   . Asthma Sister     in childhood  . Diabetes Father   . Hypertension     History  Substance Use Topics  . Smoking status: Former Smoker -- 0.30 packs/day for 20 years  . Smokeless tobacco: Never Used     Comment: Needs to cut back.  . Alcohol Use: No   OB History   Grav Para Term Preterm Abortions TAB SAB Ect Mult Living                 Review of Systems  Constitutional: Negative for fever.  Respiratory: Positive for shortness of breath. Negative for cough.   Cardiovascular: Negative for chest pain.  All other systems  reviewed and are negative.     Allergies  Vicodin  Home Medications   Current Outpatient Rx  Name  Route  Sig  Dispense  Refill  . carvedilol (COREG) 25 MG tablet      take 1 tablet by mouth twice a day with food   60 tablet   0     **NO FURTHER REFILLS WITHOUT APPOINTMENT**   . furosemide (LASIX) 40 MG tablet      take 1 tablet by mouth once daily   90 tablet   2   . insulin aspart protamine- aspart (NOVOLOG MIX 70/30) (70-30) 100 UNIT/ML injection   Subcutaneous   Inject 0.25 mLs (25 Units total) into the skin 2 (two) times daily with a meal. Taking 25 in AM and 23 in PM   10 mL   3   . metFORMIN (GLUCOPHAGE) 1000 MG tablet   Oral   Take 1 tablet (1,000 mg total) by mouth 2 (two) times daily with a meal.   180 tablet   4   . omeprazole (PRILOSEC) 40 MG capsule      take 1 capsule by mouth  once daily   90 capsule   5   . valsartan (DIOVAN) 40 MG tablet   Oral   Take 40 mg by mouth daily.         . irbesartan (AVAPRO) 150 MG tablet   Oral   Take 2 tablets (300 mg total) by mouth daily.   60 tablet   1    BP 112/63  Pulse 96  Temp(Src) 98.4 F (36.9 C) (Oral)  Resp 17  Ht 5\' 2"  (1.575 m)  Wt 253 lb (114.76 kg)  BMI 46.26 kg/m2  SpO2 97%  LMP 07/10/2013 Physical Exam  Nursing note and vitals reviewed. Constitutional: She is oriented to person, place, and time. She appears well-developed and well-nourished. No distress.  HENT:  Head: Normocephalic and atraumatic.  Mouth/Throat: Oropharynx is clear and moist.  Neck: Normal range of motion. Neck supple.  Cardiovascular: Normal rate and regular rhythm.  Exam reveals no gallop and no friction rub.   No murmur heard. Pulmonary/Chest: Effort normal and breath sounds normal. No respiratory distress. She has no wheezes.  Abdominal: Soft. Bowel sounds are normal. She exhibits no distension. There is no tenderness.  Musculoskeletal: Normal range of motion. She exhibits edema.  There is 2+ pitting edema present in the bilateral lower extremities.  Neurological: She is alert and oriented to person, place, and time.  Skin: Skin is warm and dry. She is not diaphoretic.    ED Course  Procedures (including critical care time) Labs Review Labs Reviewed  CBC - Abnormal; Notable for the following:    RBC 3.16 (*)    Hemoglobin 7.5 (*)    HCT 26.7 (*)    MCH 23.7 (*)    MCHC 28.1 (*)    RDW 19.4 (*)    All other components within normal limits  BASIC METABOLIC PANEL - Abnormal; Notable for the following:    Glucose, Bld 118 (*)    BUN 51 (*)    Creatinine, Ser 2.53 (*)    GFR calc non Af Amer 22 (*)    GFR calc Af Amer 25 (*)    All other components within normal limits  PRO B NATRIURETIC PEPTIDE - Abnormal; Notable for the following:    Pro B Natriuretic peptide (BNP) 5862.0 (*)    All other components within  normal limits  I-STAT  TROPOININ, ED   Imaging Review Dg Chest 2 View  08/08/2013   CLINICAL DATA:  EDEMA  EXAM: CHEST  2 VIEW  COMPARISON:  CT ANGIO CHEST W/CM &/OR WO/CM dated 07/25/2013  FINDINGS: Cardiac silhouette is enlarged. There is mild prominence of interstitial markings and peribronchial prominence. There are areas of increased density within the lung bases. Mild blunting of the right costophrenic angle. Osseous structures are unremarkable.  IMPRESSION: Bibasilar infiltrates versus atelectasis. Pulmonary edema. Minimal right pleural effusion.   Electronically Signed   By: Margaree Mackintosh M.D.   On: 08/08/2013 14:14     EKG Interpretation None      MDM   Final diagnoses:  None    Patient is a 43 year old female with history of sarcoidosis and congestive heart failure. She presents with worsening breathing and weight gain over the past month. Workup reveals an elevated BNP with evidence for pulmonary edema on her chest x-ray. She is also having worsening renal function from prior studies. I've spoken with the internal medicine teaching service who will admit the patient to their service.    Veryl Speak, MD 08/08/13 [phone]

## 2013-08-08 NOTE — ED Notes (Signed)
Report attempted x 1

## 2013-08-08 NOTE — ED Notes (Signed)
She states she has gained about 30 lbs over the past month, feels sob every time she walks or eats, and has had a dry cough. She is on 2L [place] at home all the time. She is able to speak in full unlabored sentences now

## 2013-08-08 NOTE — H&P (Signed)
Date: 08/08/2013               Patient Name:  Melody Gray MRN: [id number]  DOB: 1970-11-30 Age / Sex: 43 y.o., female   PCP: Jerene Pitch, MD         Medical Service: Internal Medicine Teaching Service         Attending Physician: Dr. Axel Filler, MD    First Contact: Dr. Lesly Dukes Pager: [phone]  Second Contact: Dr. Clayburn Pert Pager: [phone]       After Hours (After 5p/  First Contact Pager: [phone]  weekends / holidays): Second Contact Pager: [phone]   Chief Complaint: Shortness of breath  History of Present Illness:  Melody Gray is a 42 y.o. woman [facility] patient with PMH of sarcoidosis (on 2L home oxygen), pulmonary HTN, obstructive lung disease, diastolic HF (EF Q000111Q Nov 2013), CKD, anemia of ckd, HTN and DM type 2 (Hgb A1c 6.05 May 2013) and GERD who presents to the ED with a chief complaint of worsening shortness of breath.  Patient has chronic dyspnea thought to be multifactorial secondary to sarcoidosis, obesity hypoventilation syndrome, diastolic heart failure, COPD, and tobacco abuse. Her shortness of breath has gotten worse over the past 3 weeks, most notably on exertion. She is having trouble walking from the bed to the bathroom. She has even noticed shortness of breath when she is eating. Associated symptoms include subjectively increased abdominal girth and lower extremity edema, and nonproductive cough. She has baseline orthopnea. She denies dietary indiscretions. Has been compliant with all of her medications including Lasix. She tried taking an extra dose of Lasix x1 yesterday with no improvement.  She denies fevers, chills, chest pain, abdominal pain, nausea, vomiting, new weakness or numbness.  She was recently admitted in March for dyspnea on exertion, felt secondary to CAP and sarcoidosis flare. She was treated with antibiotics and steroids. The steroids were tapered off by her pulmonologist, Dr. Melvyn Novas, who did not feel her symptoms and imaging were  consistent with sarcoid flare.  She continues to smoke tobacco, but notes that she has cut down since her last appointment with Dr. Melvyn Novas on 4/2. She is smoking about one cigarette per day. She admits a big reason why she cut down is that she has trouble walking outside due to her dyspnea.  In the ED she was given Lasix 80 mg IV x1.   Meds: Current Facility-Administered Medications  Medication Dose Route Frequency Provider Last Rate Last Dose  . furosemide (LASIX) injection 80 mg  80 mg Intravenous BID Lesly Dukes, MD       Current Outpatient Prescriptions  Medication Sig Dispense Refill  . carvedilol (COREG) 25 MG tablet take 1 tablet by mouth twice a day with food  60 tablet  0  . furosemide (LASIX) 40 MG tablet take 1 tablet by mouth once daily  90 tablet  2  . insulin aspart protamine- aspart (NOVOLOG MIX 70/30) (70-30) 100 UNIT/ML injection Inject 0.25 mLs (25 Units total) into the skin 2 (two) times daily with a meal. Taking 25 in AM and 23 in PM  10 mL  3  . metFORMIN (GLUCOPHAGE) 1000 MG tablet Take 1 tablet (1,000 mg total) by mouth 2 (two) times daily with a meal.  180 tablet  4  . omeprazole (PRILOSEC) 40 MG capsule take 1 capsule by mouth once daily  90 capsule  5  . valsartan (DIOVAN) 40 MG tablet Take 40 mg by mouth daily.      Marland Kitchen  irbesartan (AVAPRO) 150 MG tablet Take 2 tablets (300 mg total) by mouth daily.  60 tablet  1    Allergies: Allergies as of 08/08/2013 - Review Complete 08/08/2013  Allergen Reaction Noted  . Vicodin [hydrocodone-acetaminophen] Itching 11/17/2010   Past Medical History  Diagnosis Date  . Sarcoidosis     Followed by Dr. Melvyn Novas; w/ liver involvement per biopsy 12/09, Reversible airway component so started on [employer] 01/2010; HFA 75% p coaching 05/2010  . Hypoxemia     CT angiogram 9/11>> No PE; PFTs 10/11- FEV1 1.20 (49%) with 16% better p B2, DLCO 33%> corrects to 84; O2 sats ok on 4 lpm X rapid walk X 3 laps 05/2010  . Morbid obesity     Target wt=  153 for BMI <30  . QT prolongation   . Diabetes mellitus   . Hypertension   . Hx of cardiac cath 2/08    No CAD, no RAS,  normal EF  . Seborrheic dermatitis of scalp   . 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 Hep B serology neg. but Hep C ab +, HIV neg. 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 Cardiomyopathy, nonischemic     EF 45% 12/10; Echo 7/11 normal EF, PAS 48  . Diabetic retinopathy     Right eye 2/11  . Health maintenance examination     Mammogram 05/2010 Negative; Last Pap smear 03/2008; Last DM eye exam 2/11> mild non-proliferative diabetic retinopathy. OD  . Helicobacter pylori ab+ 05/2011    Pt was symptomatic and treatment planned for 05/2011  . CHF (congestive heart failure)    Past Surgical History  Procedure Laterality Date  . Tubal ligation    . Breast surgery    . Cesarean section     Family History  Problem Relation Age of Onset  . Cancer Mother     colon cancer  . Multiple sclerosis Father   . Asthma Sister     in childhood  . Diabetes Father   . Hypertension     History   Social History  . Marital Status: Single    Spouse Name: N/A    Number of Children: 2  . Years of Education: N/A   Occupational History  . 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 Smoking status: Former Smoker -- 0.30 packs/day for 20 years  . Smokeless tobacco: Never Used     Comment: Needs to cut back.  . Alcohol Use: No  . Drug Use: No  . Sexual Activity: Not on file   Other Topics Concern  . Not on file   Social History Narrative   Diabetic card given 05/03/2010.   Financial assistance approved for 100% discount at [facility] and has [facility] card. [name] 12/07/2009.      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    Review of Systems: Pertinent items are noted in HPI.  Physical Exam: Blood pressure 112/63, pulse 96, temperature 98.4 F  (36.9 C), temperature source Oral, resp. rate 17, height 5\' 2"  (1.575 m), weight 253 lb (114.76 kg), last menstrual period 07/10/2013, SpO2 97.00%. Physical Exam  Constitutional: She is oriented to person, place, and time and well-developed, well-nourished, and in no distress.  Speaking full sentences. Pleasant.  HENT:  Head: Normocephalic and atraumatic.  Eyes: Conjunctivae and  EOM are normal. Pupils are equal, round, and reactive to light.  Neck: Normal range of motion. Neck supple. No JVD (But exam limited by body habitus) present.  Cardiovascular: Normal rate, regular rhythm, normal heart sounds and intact distal pulses.  Exam reveals no gallop and no friction rub.   No murmur heard. Pulmonary/Chest: Effort normal. No respiratory distress. She has wheezes (Some mild scattered expiratory wheezing). She has no rales. She exhibits no tenderness.  Difficult exam due to body habitus  Abdominal: Soft. Bowel sounds are normal. She exhibits no distension. There is no tenderness.  Musculoskeletal: Normal range of motion. She exhibits edema (1+ pitting edema to shins). She exhibits no tenderness.  Neurological: She is alert and oriented to person, place, and time. No cranial nerve deficit. GCS score is 15.  Skin: Skin is warm and dry.  Psychiatric: Mood and affect normal.    Lab results: Basic Metabolic Panel:  Recent Labs  08/08/13 1321  NA 143  K 4.8  CL 97  CO2 32  GLUCOSE 118*  BUN 51*  CREATININE 2.53*  CALCIUM 9.3   CBC:  Recent Labs  08/08/13 1321  WBC 8.6  HGB 7.5*  HCT 26.7*  MCV 84.5  PLT 182   BNP:  Recent Labs  08/08/13 1402  PROBNP 5862.0*    Imaging results:  Dg Chest 2 View  08/08/2013   CLINICAL DATA:  EDEMA  EXAM: CHEST  2 VIEW  COMPARISON:  CT ANGIO CHEST W/CM &/OR WO/CM dated 07/25/2013  FINDINGS: Cardiac silhouette is enlarged. There is mild prominence of interstitial markings and peribronchial prominence. There are areas of increased density  within the lung bases. Mild blunting of the right costophrenic angle. Osseous structures are unremarkable.  IMPRESSION: Bibasilar infiltrates versus atelectasis. Pulmonary edema. Minimal right pleural effusion.   Electronically Signed   By: Margaree Mackintosh M.D.   On: 08/08/2013 14:14    Other results: EKG: Pending.  Assessment & Plan by Problem: Melody Gray is a 43 y.o. woman Melody Gray patient with PMH of sarcoidosis (on 2L home oxygen), pulmonary HTN, obstructive lung disease, diastolic HF (EF Q000111Q Nov 2013), CKD, anemia of ckd, HTN and DM type 2 (Hgb A1c 6.05 May 2013) and GERD who presents to the ED with a chief complaint of worsening shortness of breath.  #Acute on chronic diastolic heart failure exacerbation - Patient presents with acute on chronic dyspnea, weight gain (239 on 3/22 > 253 on 4/10), nonproductive cough, worsening edema. ProBNP elevated at 5800. ED CXR suggestive of pulmonary edema with minimal right pleural effusion. ECHO in Nov 2013 - EF 45-50% with grade 1 diastolic dysfunction, normal RV function. At home she takes Lasix 40mg  daily, Coreg 25mg  BID, olmesartan 20mg  daily. She notably also has an AKI, see below. We curbsided Dr. Haroldine Laws, who recommended Lasix 80 mg IV twice a day and monitor urine output, if no improvement will see the patient in consultation tomorrow. Otherwise, AVSS, afebrile, without leukocytosis. - Admit to IMTS, telemetry - Lasix 80mg  IV BID - ASA 81mg  daily - Coreg 25 mg twice a day - Trending troponins to rule out ischemia, point of care was negative - EKG now and in am - Daily weights - Strict I/Os - NPO for now while diuresing - BMP in am  #Acute on CKD2 - Baseline Cr 0.6-0.8. On admission Cr was 2.53, up from 1.10 on 4/6. Pre-renal vs. Cardiorenal. - Checking FeUrea - Diuresing as above - Strict I/Os - Trend BMPs  #Sarcoidosis -  Stable. She saw her pulmonologist, Dr. Melvyn Novas, on April 4 who saw no evidence of sarcoid activity on her most recent  CT and felt there was no need for prednisone at that time. She was on a short course of prednisone after her admission in 3/15 also for dyspnea on exertion (felt likely 2/2 to CAP and Sarcoidosis flare)  that was tapered by Dr. Melvyn Novas.  #DM type 2 - A1C 7.5 in 04/10 (after a course of steroids s/p admission in 03/15, previously well controlled). Home medications: 70/30 Novolog 25 units qAM and 23 units qPM, 1000mg  metformin BID. - Lantus 5 units daily at bedtime - SSI sensitive - CBG monitoring - Hold Metformin  #HTN - Stable. Continue home medications: Coreg 25mg  BID and olmesartan 20mg  daily.   #Anemia of CKD - Baseline Hgb 8.6-9.8. Hgb 7.5 on admission. No s/s of bleeding.  - CBC in am  #GERD - Continue PPI  #DVT PPX - Heparin subq and SCDs  Dispo: Disposition is deferred at this time, awaiting improvement of current medical problems. Anticipated discharge in approximately 2-3 day(s).   The patient does have a current PCP Jerene Pitch, MD) and does need an [facility] follow-up appointment after discharge.  The patient does not have transportation limitations that hinder transportation to clinic appointments.  Signed: Lesly Dukes, MD 08/08/2013, 3:36 PM   Lesly Dukes, MD  [email or web address] Pager # [phone] After hours and weekends # [phone] Office # [phone]

## 2013-08-08 NOTE — Telephone Encounter (Signed)
Spoke with pt. She is going to ED. She reports she is having fluid build up in legs and stomach, she feels SOB. She did not want to come in and be seen as she reports the ED can do more. Nothing further needed

## 2013-08-08 NOTE — ED Notes (Signed)
Pt arrived to room 40 now.

## 2013-08-09 ENCOUNTER — Inpatient Hospital Stay (HOSPITAL_COMMUNITY): Payer: Medicare Other

## 2013-08-09 DIAGNOSIS — K219 Gastro-esophageal reflux disease without esophagitis: Secondary | ICD-10-CM

## 2013-08-09 DIAGNOSIS — J811 Chronic pulmonary edema: Secondary | ICD-10-CM | POA: Diagnosis not present

## 2013-08-09 DIAGNOSIS — I5033 Acute on chronic diastolic (congestive) heart failure: Principal | ICD-10-CM

## 2013-08-09 DIAGNOSIS — N179 Acute kidney failure, unspecified: Secondary | ICD-10-CM | POA: Diagnosis not present

## 2013-08-09 DIAGNOSIS — I129 Hypertensive chronic kidney disease with stage 1 through stage 4 chronic kidney disease, or unspecified chronic kidney disease: Secondary | ICD-10-CM

## 2013-08-09 DIAGNOSIS — E872 Acidosis: Secondary | ICD-10-CM | POA: Diagnosis not present

## 2013-08-09 DIAGNOSIS — J9620 Acute and chronic respiratory failure, unspecified whether with hypoxia or hypercapnia: Secondary | ICD-10-CM | POA: Diagnosis not present

## 2013-08-09 DIAGNOSIS — J9819 Other pulmonary collapse: Secondary | ICD-10-CM | POA: Diagnosis not present

## 2013-08-09 DIAGNOSIS — N182 Chronic kidney disease, stage 2 (mild): Secondary | ICD-10-CM | POA: Diagnosis not present

## 2013-08-09 DIAGNOSIS — I509 Heart failure, unspecified: Secondary | ICD-10-CM | POA: Diagnosis not present

## 2013-08-09 LAB — BASIC METABOLIC PANEL
BUN: 47 mg/dL — ABNORMAL HIGH (ref 6–23)
CO2: 35 mEq/L — ABNORMAL HIGH (ref 19–32)
Calcium: 9.5 mg/dL (ref 8.4–10.5)
Chloride: 98 mEq/L (ref 96–112)
Creatinine, Ser: 2.26 mg/dL — ABNORMAL HIGH (ref 0.50–1.10)
GFR calc Af Amer: 29 mL/min — ABNORMAL LOW (ref >90)
GFR calc non Af Amer: 25 mL/min — ABNORMAL LOW (ref >90)
Glucose, Bld: 109 mg/dL — ABNORMAL HIGH (ref 70–99)
Potassium: 5 mEq/L (ref 3.7–5.3)
Sodium: 145 mEq/L (ref 137–147)

## 2013-08-09 LAB — CREATININE, SERUM
Creatinine, Ser: 2.14 mg/dL — ABNORMAL HIGH (ref 0.50–1.10)
GFR calc Af Amer: 31 mL/min — ABNORMAL LOW (ref >90)
GFR calc non Af Amer: 27 mL/min — ABNORMAL LOW (ref >90)

## 2013-08-09 LAB — IRON AND TIBC
Iron: 27 ug/dL — ABNORMAL LOW (ref 42–135)
Saturation Ratios: 5 % — ABNORMAL LOW (ref 20–55)
TIBC: 495 ug/dL — ABNORMAL HIGH (ref 250–470)
UIBC: 468 ug/dL — ABNORMAL HIGH (ref 125–400)

## 2013-08-09 LAB — CBC
HCT: 26.6 % — ABNORMAL LOW (ref 36.0–46.0)
Hemoglobin: 7.2 g/dL — ABNORMAL LOW (ref 12.0–15.0)
MCH: 23.2 pg — ABNORMAL LOW (ref 26.0–34.0)
MCHC: 27.1 g/dL — ABNORMAL LOW (ref 30.0–36.0)
MCV: 85.8 fL (ref 78.0–100.0)
Platelets: 170 10*3/uL (ref 150–400)
RBC: 3.1 MIL/uL — ABNORMAL LOW (ref 3.87–5.11)
RDW: 19.4 % — ABNORMAL HIGH (ref 11.5–15.5)
WBC: 7.6 10*3/uL (ref 4.0–10.5)

## 2013-08-09 LAB — TROPONIN I
Troponin I: <0.3 ng/mL (ref <0.30)
Troponin I: <0.3 ng/mL (ref <0.30)
Troponin I: <0.3 ng/mL (ref <0.30)

## 2013-08-09 LAB — RETICULOCYTES
RBC.: 3.34 MIL/uL — ABNORMAL LOW (ref 3.87–5.11)
Retic Count, Absolute: 160.3 10*3/uL (ref 19.0–186.0)
Retic Ct Pct: 4.8 % — ABNORMAL HIGH (ref 0.4–3.1)

## 2013-08-09 LAB — GLUCOSE, CAPILLARY
Glucose-Capillary: 113 mg/dL — ABNORMAL HIGH (ref 70–99)
Glucose-Capillary: 184 mg/dL — ABNORMAL HIGH (ref 70–99)
Glucose-Capillary: 226 mg/dL — ABNORMAL HIGH (ref 70–99)
Glucose-Capillary: 274 mg/dL — ABNORMAL HIGH (ref 70–99)

## 2013-08-09 LAB — UREA NITROGEN, URINE: Urea Nitrogen, Ur: 357 mg/dL

## 2013-08-09 LAB — CREATININE, URINE, RANDOM: Creatinine, Urine: 56.48 mg/dL

## 2013-08-09 LAB — PREPARE RBC (CROSSMATCH)

## 2013-08-09 LAB — FERRITIN: Ferritin: 16 ng/mL (ref 10–291)

## 2013-08-09 LAB — VITAMIN B12: Vitamin B-12: 908 pg/mL (ref 211–911)

## 2013-08-09 LAB — FOLATE: Folate: >20 ng/mL

## 2013-08-09 MED ORDER — FUROSEMIDE 80 MG PO TABS
80.0000 mg | ORAL_TABLET | Freq: Two times a day (BID) | ORAL | Status: DC
  Administered 2013-08-09: 80 mg via ORAL
  Filled 2013-08-09 (×4): qty 1

## 2013-08-09 NOTE — H&P (Signed)
Internal Medicine Attending Admission Note Date: 08/09/2013  Patient name: Melody Gray Medical record number: [id number] Date of birth: [birth year] Age: 43 y.o. Gender: female  I saw and evaluated the patient. I reviewed the resident's note and I agree with the resident's findings and plan as documented in the resident's note, with the following additional comments.  Chief Complaint(s): Shortness of breath  History - key components related to admission: Patient is a 43 year old woman with history of sarcoidosis, pulmonary hypertension, obstructive lung disease, diastolic heart failure, chronic kidney disease, hypertension, type 2 diabetes mellitus, and other problems as outlined in the medical history, admitted with complaint of progressively worsening shortness of breath over the past 3 weeks.  Patient reports dyspnea with minimal exertion; she also feels that her legs and abdomen have been more swollen.  She denies chest pain or abdominal pain.  She reports that she has been compliant with her medications.   Physical Exam - key components related to admission:  Filed Vitals:   08/08/13 2200 08/08/13 2215 08/09/13 0428 08/09/13 0812  BP: 115/73 130/81 103/47 105/68  Pulse: 96 95 90 86  Temp:   97.1 F (36.2 C) 97.9 F (36.6 C)  TempSrc:   Oral Oral  Resp: 19 16 22 23   Height:      Weight:   255 lb 11.7 oz (116 kg)   SpO2: 93% 94% 95% 100%   General: Alert, oriented, no distress Lungs: Few basilar crackles Heart: Regular; no extra sounds or murmurs Abdomen: Bowel sounds present, soft, nontender Extremities: 1+ bilateral lower extremity edema   Intake/Output Summary (Last 24 hours) at 08/09/13 1209 Last data filed at 08/09/13 1100  Gross per 24 hour  Intake    360 ml  Output   1650 ml  Net  -1290 ml     Lab results:   Basic Metabolic Panel:  Recent Labs  08/08/13 1321 08/08/13 2314 08/09/13 0343  NA 143  --  145  K 4.8  --  5.0  CL 97  --  98  CO2 32  --  35*   GLUCOSE 118*  --  109*  BUN 51*  --  47*  CREATININE 2.53* 2.14* 2.26*  CALCIUM 9.3  --  9.5     Lab Results  Component Value Date   CREATININE 2.26* 08/09/2013   CREATININE 2.14* 08/08/2013   CREATININE 2.53* 08/08/2013   CREATININE 1.10 08/04/2013   CREATININE 0.9 07/31/2013   CREATININE 0.83 07/25/2013   CREATININE 1.07 07/21/2013   CREATININE 1.06 07/20/2013     CBC:  Recent Labs  08/08/13 1321 08/09/13 0343  WBC 8.6 7.6  HGB 7.5* 7.2*  HCT 26.7* 26.6*  MCV 84.5 85.8  PLT 182 170     Cardiac Enzymes:  Recent Labs  08/08/13 2314 08/09/13 0348  TROPONINI <0.30 <0.30    BNP:  Recent Labs  08/08/13 1402  PROBNP 5862.0*     CBG:  Recent Labs  08/08/13 2117 08/09/13 0815  GLUCAP 123* 113*     Urinalysis    Component Value Date/Time   COLORURINE YELLOW 05/09/2011 2123   APPEARANCEUR CLOUDY* 05/09/2011 2123   LABSPEC 1.008 05/09/2011 2123   PHURINE 5.0 05/09/2011 2123   GLUCOSEU NEGATIVE 05/09/2011 2123   GLUCOSEU NEG mg/dL 09/29/2009 1450   HGBUR LARGE* 05/09/2011 2123   BILIRUBINUR NEGATIVE 05/09/2011 2123   KETONESUR NEGATIVE 05/09/2011 2123   PROTEINUR NEGATIVE 05/09/2011 2123   UROBILINOGEN 1.0 05/09/2011 2123   NITRITE NEGATIVE 05/09/2011 2123  LEUKOCYTESUR SMALL* 05/09/2011 2123     Imaging results:  Dg Chest 2 View  08/09/2013   CLINICAL DATA:  Volume overload.  EXAM: CHEST  2 VIEW  COMPARISON:  08/08/2013  FINDINGS: Cardiac silhouette is mildly enlarged. No convincing mediastinal or hilar mass or adenopathy. Prominent central pulmonary vascularity, but no overt edema. Lung base opacity is noted bilaterally most consistent with atelectasis  No pneumothorax.  IMPRESSION: 1. Over pulmonary edema. There is mild cardiomegaly and central vascular congestion similar to the previous day's study. 2. Stable basilar atelectasis.   Electronically Signed   By: Lajean Manes M.D.   On: 08/09/2013 09:15   Dg Chest 2 View  08/08/2013   CLINICAL DATA:  EDEMA  EXAM: CHEST  2  VIEW  COMPARISON:  CT ANGIO CHEST W/CM &/OR WO/CM dated 07/25/2013  FINDINGS: Cardiac silhouette is enlarged. There is mild prominence of interstitial markings and peribronchial prominence. There are areas of increased density within the lung bases. Mild blunting of the right costophrenic angle. Osseous structures are unremarkable.  IMPRESSION: Bibasilar infiltrates versus atelectasis. Pulmonary edema. Minimal right pleural effusion.   Electronically Signed   By: Margaree Mackintosh M.D.   On: 08/08/2013 14:14    Other results: EKG: Sinus rhythm; low voltage, precordial leads; borderline T wave abnormalities  Assessment & Plan by Problem:  1.  Progressive dyspnea, likely due to CHF exacerbation.  Patient reports progressively worsening dyspnea over 3 weeks, accompanied by weight gain and worsening edema.  Her proBNP is elevated, and her chest x-ray supports the diagnosis.  The plan is diurese with IV Lasix; follow in/outs, weights, electrolytes, and renal function; monitor.    2.  Acute renal insufficiency.  The etiology of this is unclear; patient's creatinine has increased from baseline of 1.1 on 08/04/2013.  Review of the chart does show recent changes in her ARB, with her current dose of irbesartan 300 mg daily apparently started in clinic on 08/04/2013.  Her admission medication list shows both valsartan and irbesartan; we need to clarify whether she was taking both medications or not prior to admission.  Her creatinine is slightly better today; if it does not further improve, would reduce or hold the irbesartan as we are trying to sort this out, and would obtain renal ultrasound to rule out hydronephrosis.     3.  Anemia.  Patient has chronic normocytic anemia; her last ferritin level was 6 in May of 2014, indicative of iron deficiency.   Plan is oral iron replacement; follow CBC; Hemoccult stools.  4.  Other problems and plans as per the resident.

## 2013-08-09 NOTE — Progress Notes (Addendum)
Subjective: Patient seen and examined at the bedside this morning. She feels a little better. She is making good urine. She was able to ambulate to the bathroom without much trouble. She asks if she can get a sleep study. I offered to provide CPAP overnight and she is amenable.  Objective: Vital signs in last 24 hours: Filed Vitals:   08/08/13 2200 08/08/13 2215 08/09/13 0428 08/09/13 0812  BP: 115/73 130/81 103/47 105/68  Pulse: 96 95 90 86  Temp:   97.1 F (36.2 C) 97.9 F (36.6 C)  TempSrc:   Oral Oral  Resp: 19 16 22 23   Height:      Weight:   255 lb 11.7 oz (116 kg)   SpO2: 93% 94% 95% 100%   Weight change:   Intake/Output Summary (Last 24 hours) at 08/09/13 1114 Last data filed at 08/09/13 0500  Gross per 24 hour  Intake    240 ml  Output    850 ml  Net   -610 ml   Physical Exam  Constitutional: She is oriented to person, place, and time and well-developed, well-nourished, and in no distress.  Speaking full sentences. Pleasant.  HENT:  Head: Normocephalic and atraumatic.  Eyes: Conjunctivae and EOM are normal. Pupils are equal, round, and reactive to light.  Neck: Normal range of motion. Neck supple. No JVD (But exam limited by body habitus) present.  Cardiovascular: Normal rate, regular rhythm, normal heart sounds and intact distal pulses. Exam reveals no gallop and no friction rub.  No murmur heard.  Pulmonary/Chest: Effort normal. No respiratory distress. No wheezes. She exhibits no tenderness.  Soft crackles at the bases bilaterally. Difficult exam due to body habitus  Abdominal: Soft. Bowel sounds are normal. She exhibits no distension. There is no tenderness.  Musculoskeletal: Normal range of motion. She exhibits edema (1+ pitting edema to shins). She exhibits no tenderness.  Neurological: She is alert and oriented to person, place, and time. No cranial nerve deficit. GCS score is 15.  Skin: Skin is warm and dry.  Psychiatric: Mood and affect normal.   Lab  Results: Basic Metabolic Panel:  Recent Labs Lab 08/08/13 1321 08/08/13 2314 08/09/13 0343  NA 143  --  145  K 4.8  --  5.0  CL 97  --  98  CO2 32  --  35*  GLUCOSE 118*  --  109*  BUN 51*  --  47*  CREATININE 2.53* 2.14* 2.26*  CALCIUM 9.3  --  9.5   Liver Function Tests:  Recent Labs Lab 08/04/13 1500  AST 26  ALT 24  ALKPHOS 91  BILITOT 0.6  PROT 6.4  ALBUMIN 3.4*   CBC:  Recent Labs Lab 08/08/13 1321 08/09/13 0343  WBC 8.6 7.6  HGB 7.5* 7.2*  HCT 26.7* 26.6*  MCV 84.5 85.8  PLT 182 170   Cardiac Enzymes:  Recent Labs Lab 08/08/13 2314 08/09/13 0348  TROPONINI <0.30 <0.30   BNP:  Recent Labs Lab 08/08/13 1402  PROBNP 5862.0*   CBG:  Recent Labs Lab 08/08/13 2117 08/09/13 0815  GLUCAP 123* 113*    Micro Results: Recent Results (from the past 240 hour(s))  MRSA PCR SCREENING     Status: None   Collection Time    08/08/13  8:44 PM      Result Value Ref Range Status   MRSA by PCR NEGATIVE  NEGATIVE Final   Comment:            The GeneXpert  MRSA Assay (FDA     approved for NASAL specimens     only), is one component of a     comprehensive MRSA colonization     surveillance program. It is not     intended to diagnose MRSA     infection nor to guide or     monitor treatment for     MRSA infections.   Studies/Results: Dg Chest 2 View  08/09/2013   CLINICAL DATA:  Volume overload.  EXAM: CHEST  2 VIEW  COMPARISON:  08/08/2013  FINDINGS: Cardiac silhouette is mildly enlarged. No convincing mediastinal or hilar mass or adenopathy. Prominent central pulmonary vascularity, but no overt edema. Lung base opacity is noted bilaterally most consistent with atelectasis  No pneumothorax.  IMPRESSION: 1. Over pulmonary edema. There is mild cardiomegaly and central vascular congestion similar to the previous day's study. 2. Stable basilar atelectasis.   Electronically Signed   By: Lajean Manes M.D.   On: 08/09/2013 09:15   Dg Chest 2  View  08/08/2013   CLINICAL DATA:  EDEMA  EXAM: CHEST  2 VIEW  COMPARISON:  CT ANGIO CHEST W/CM &/OR WO/CM dated 07/25/2013  FINDINGS: Cardiac silhouette is enlarged. There is mild prominence of interstitial markings and peribronchial prominence. There are areas of increased density within the lung bases. Mild blunting of the right costophrenic angle. Osseous structures are unremarkable.  IMPRESSION: Bibasilar infiltrates versus atelectasis. Pulmonary edema. Minimal right pleural effusion.   Electronically Signed   By: Margaree Mackintosh M.D.   On: 08/08/2013 14:14   Medications: I have reviewed the patient's current medications. Scheduled Meds: . aspirin EC  81 mg Oral Daily  . carvedilol  25 mg Oral BID WC  . furosemide  80 mg Intravenous BID  . heparin  5,000 Units Subcutaneous 3 times per day  . insulin aspart  0-9 Units Subcutaneous TID WC  . insulin glargine  5 Units Subcutaneous QHS  . irbesartan  300 mg Oral Daily  . pantoprazole  40 mg Oral Daily   Continuous Infusions:  PRN Meds:.  Assessment/Plan: Melody Gray is a 43 y.o. woman [facility] patient with PMH of sarcoidosis (on 2L home oxygen), pulmonary HTN, obstructive lung disease, diastolic HF (EF Q000111Q Nov 2013), CKD, anemia of ckd, HTN and DM type 2 (Hgb A1c 6.05 May 2013) and GERD who presents to the ED with a chief complaint of worsening shortness of breath.   #Acute on chronic diastolic heart failure exacerbation - Shortness of breath subjectively improved today. She diuresed overnight. AVSS, she is satting well on her home oxygen 2L . Troponins have been negative. EKG unremarkable. Repeat CXR shows NO overt edema, mild cardiomegaly and central vascular congestion, stable basilar atelectasis. - Transfer to telemetry - Repeat 2D echo to evaluate for evolution of CHF (last was in 11/13) - Continue Lasix 80mg  IV BID  - ASA 81mg  daily  - Coreg 25 mg twice a day  - Daily weights > 256 > 255 - Strict I/Os > UOP 850cc + 1 unmeasured  [place] - Renal/carb mod diet - PT eval and treat - BMP in am   #Acute on CKD2 - Baseline Cr 0.6-0.8. On admission Cr was 2.53, up from 1.10 on 4/6. FeUrea 31%, suggesting possible pre-renal etiology, but Cr improved with diuresis overnight and clinically assessment points more towards volume overload. - Diuresing as above  - Trend BMPs   Creatinine, Ser  Date Value Ref Range Status  08/09/2013 2.26* 0.50 - 1.10  mg/dL Final  08/08/2013 2.14* 0.50 - 1.10 mg/dL Final  08/08/2013 2.53* 0.50 - 1.10 mg/dL Final    #Sarcoidosis - Stable. She saw her pulmonologist, Dr. Melvyn Novas, on April 4 who saw no evidence of sarcoid activity on her most recent CT and felt there was no need for prednisone at that time. She was on a short course of prednisone after her admission in 3/15 also for dyspnea on exertion (felt likely 2/2 to CAP and Sarcoidosis flare) that was tapered by Dr. Melvyn Novas.   #DM type 2 - A1C 7.5 in 04/10 (after a course of steroids s/p admission in 03/15, previously well controlled). Home medications: 70/30 Novolog 25 units qAM and 23 units qPM, 1000mg  metformin BID.  - Lantus 5 units daily at bedtime  - SSI sensitive  - CBG monitoring  - Hold Metformin   #HTN - Stable. Continue home medications: Coreg 25mg  BID and olmesartan 20mg  daily.   #Anemia of CKD - Baseline Hgb 8.6-9.8. Hgb 7.5 on admission. Stable today. No signs or symptoms of bleeding.  - Checking anemia panel - CBC in am   Hemoglobin  Date Value Ref Range Status  08/09/2013 7.2* 12.0 - 15.0 g/dL Final  08/08/2013 7.5* 12.0 - 15.0 g/dL Final  08/04/2013 8.4* 12.0 - 15.0 g/dL Final  07/31/2013 8.2 Repeated and verified X2.* 12.0 - 15.0 g/dL Final  07/21/2013 8.3* 12.0 - 15.0 g/dL Final    #OHS/OSA - Per Dr. 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 I do not see a sleep study in her chart but will continute to look, she denies ever having one. - CPAP  qhs.  #GERD - Continue PPI.  #DVT PPX - Heparin subq and SCDs.   Dispo: Disposition is deferred at this time, awaiting improvement of current medical problems.  Anticipated discharge in approximately 1-3 day(s).   The patient does have a current PCP Jerene Pitch, MD) and does need an [facility] hospital follow-up appointment after discharge.  The patient does not have transportation limitations that hinder transportation to clinic appointments.  .Services Needed at time of discharge: Y = Yes, Blank = No PT:   OT:   RN:   Equipment:   Other:     LOS: 1 day   Lesly Dukes, MD 08/09/2013, 11:14 AM  Lesly Dukes, MD  [email or web address] Pager # [phone] After hours and weekends # [phone] Office # [phone]

## 2013-08-09 NOTE — Progress Notes (Signed)
Pt with hypotension in the setting of worsening anemia. Hgb 7.2 today from 7.5 on admission. She is menstruating currently which could explain some of the anemia, but is otherwise not actively bleeding. She is still with SOB and dyspnea, which could be a sequelle of her anemia. Will transfuse 1 u blood and see if this not only helps her blood pressure but her breathing.  - Checking FOBT - Transfusing 1u pRBC with post transfusion CBC

## 2013-08-10 ENCOUNTER — Encounter (HOSPITAL_COMMUNITY): Payer: Medicare Other | Admitting: Anesthesiology

## 2013-08-10 ENCOUNTER — Encounter (HOSPITAL_COMMUNITY)
Admission: EM | Disposition: A | Discharge status: Other Acute Inpatient Hospital | Payer: Self-pay | Source: Home / Self Care | Attending: Critical Care Medicine

## 2013-08-10 ENCOUNTER — Inpatient Hospital Stay (HOSPITAL_COMMUNITY): Payer: Medicare Other

## 2013-08-10 ENCOUNTER — Inpatient Hospital Stay (HOSPITAL_COMMUNITY): Payer: Medicare Other | Admitting: Anesthesiology

## 2013-08-10 DIAGNOSIS — E875 Hyperkalemia: Secondary | ICD-10-CM | POA: Diagnosis not present

## 2013-08-10 DIAGNOSIS — I5033 Acute on chronic diastolic (congestive) heart failure: Secondary | ICD-10-CM | POA: Diagnosis not present

## 2013-08-10 DIAGNOSIS — I509 Heart failure, unspecified: Secondary | ICD-10-CM | POA: Diagnosis not present

## 2013-08-10 DIAGNOSIS — F17200 Nicotine dependence, unspecified, uncomplicated: Secondary | ICD-10-CM

## 2013-08-10 DIAGNOSIS — N19 Unspecified kidney failure: Secondary | ICD-10-CM | POA: Diagnosis not present

## 2013-08-10 DIAGNOSIS — E119 Type 2 diabetes mellitus without complications: Secondary | ICD-10-CM | POA: Diagnosis not present

## 2013-08-10 DIAGNOSIS — I2789 Other specified pulmonary heart diseases: Secondary | ICD-10-CM

## 2013-08-10 DIAGNOSIS — R0609 Other forms of dyspnea: Secondary | ICD-10-CM | POA: Diagnosis not present

## 2013-08-10 DIAGNOSIS — D631 Anemia in chronic kidney disease: Secondary | ICD-10-CM

## 2013-08-10 DIAGNOSIS — J95821 Acute postprocedural respiratory failure: Secondary | ICD-10-CM

## 2013-08-10 DIAGNOSIS — N179 Acute kidney failure, unspecified: Secondary | ICD-10-CM | POA: Diagnosis not present

## 2013-08-10 DIAGNOSIS — J9620 Acute and chronic respiratory failure, unspecified whether with hypoxia or hypercapnia: Secondary | ICD-10-CM

## 2013-08-10 DIAGNOSIS — N039 Chronic nephritic syndrome with unspecified morphologic changes: Secondary | ICD-10-CM

## 2013-08-10 DIAGNOSIS — E1139 Type 2 diabetes mellitus with other diabetic ophthalmic complication: Secondary | ICD-10-CM

## 2013-08-10 DIAGNOSIS — I369 Nonrheumatic tricuspid valve disorder, unspecified: Secondary | ICD-10-CM

## 2013-08-10 DIAGNOSIS — R579 Shock, unspecified: Secondary | ICD-10-CM | POA: Diagnosis not present

## 2013-08-10 DIAGNOSIS — D869 Sarcoidosis, unspecified: Secondary | ICD-10-CM

## 2013-08-10 DIAGNOSIS — R918 Other nonspecific abnormal finding of lung field: Secondary | ICD-10-CM | POA: Diagnosis not present

## 2013-08-10 DIAGNOSIS — J9600 Acute respiratory failure, unspecified whether with hypoxia or hypercapnia: Secondary | ICD-10-CM | POA: Diagnosis not present

## 2013-08-10 DIAGNOSIS — I10 Essential (primary) hypertension: Secondary | ICD-10-CM | POA: Diagnosis not present

## 2013-08-10 DIAGNOSIS — S128XXA Fracture of other parts of neck, initial encounter: Secondary | ICD-10-CM | POA: Diagnosis not present

## 2013-08-10 DIAGNOSIS — J9610 Chronic respiratory failure, unspecified whether with hypoxia or hypercapnia: Secondary | ICD-10-CM

## 2013-08-10 DIAGNOSIS — E872 Acidosis: Secondary | ICD-10-CM | POA: Diagnosis not present

## 2013-08-10 DIAGNOSIS — I428 Other cardiomyopathies: Secondary | ICD-10-CM

## 2013-08-10 HISTORY — PX: TRACHEOSTOMY TUBE PLACEMENT: SHX814

## 2013-08-10 LAB — BASIC METABOLIC PANEL
BUN: 54 mg/dL — ABNORMAL HIGH (ref 6–23)
BUN: 57 mg/dL — ABNORMAL HIGH (ref 6–23)
BUN: 57 mg/dL — ABNORMAL HIGH (ref 6–23)
BUN: 56 mg/dL — ABNORMAL HIGH (ref 6–23)
CO2: 36 mEq/L — ABNORMAL HIGH (ref 19–32)
CO2: 37 mEq/L — ABNORMAL HIGH (ref 19–32)
CO2: 31 mEq/L (ref 19–32)
CO2: 35 mEq/L — ABNORMAL HIGH (ref 19–32)
Calcium: 9 mg/dL (ref 8.4–10.5)
Calcium: 8.9 mg/dL (ref 8.4–10.5)
Calcium: 8.6 mg/dL (ref 8.4–10.5)
Calcium: 8.9 mg/dL (ref 8.4–10.5)
Chloride: 97 mEq/L (ref 96–112)
Chloride: 97 mEq/L (ref 96–112)
Chloride: 95 mEq/L — ABNORMAL LOW (ref 96–112)
Chloride: 97 mEq/L (ref 96–112)
Creatinine, Ser: 3.44 mg/dL — ABNORMAL HIGH (ref 0.50–1.10)
Creatinine, Ser: 3.53 mg/dL — ABNORMAL HIGH (ref 0.50–1.10)
Creatinine, Ser: 3.36 mg/dL — ABNORMAL HIGH (ref 0.50–1.10)
Creatinine, Ser: 3.28 mg/dL — ABNORMAL HIGH (ref 0.50–1.10)
GFR calc Af Amer: 18 mL/min — ABNORMAL LOW (ref >90)
GFR calc Af Amer: 17 mL/min — ABNORMAL LOW (ref >90)
GFR calc Af Amer: 18 mL/min — ABNORMAL LOW (ref >90)
GFR calc Af Amer: 19 mL/min — ABNORMAL LOW (ref >90)
GFR calc non Af Amer: 15 mL/min — ABNORMAL LOW (ref >90)
GFR calc non Af Amer: 15 mL/min — ABNORMAL LOW (ref >90)
GFR calc non Af Amer: 16 mL/min — ABNORMAL LOW (ref >90)
GFR calc non Af Amer: 16 mL/min — ABNORMAL LOW (ref >90)
Glucose, Bld: 190 mg/dL — ABNORMAL HIGH (ref 70–99)
Glucose, Bld: 161 mg/dL — ABNORMAL HIGH (ref 70–99)
Glucose, Bld: 174 mg/dL — ABNORMAL HIGH (ref 70–99)
Glucose, Bld: 179 mg/dL — ABNORMAL HIGH (ref 70–99)
Potassium: 6 mEq/L — ABNORMAL HIGH (ref 3.7–5.3)
Potassium: 6.7 mEq/L (ref 3.7–5.3)
Potassium: 6.7 mEq/L (ref 3.7–5.3)
Potassium: 6 mEq/L — ABNORMAL HIGH (ref 3.7–5.3)
Sodium: 141 mEq/L (ref 137–147)
Sodium: 142 mEq/L (ref 137–147)
Sodium: 140 mEq/L (ref 137–147)
Sodium: 143 mEq/L (ref 137–147)

## 2013-08-10 LAB — LACTIC ACID, PLASMA: Lactic Acid, Venous: 1 mmol/L (ref 0.5–2.2)

## 2013-08-10 LAB — CBC
HCT: 28.1 % — ABNORMAL LOW (ref 36.0–46.0)
Hemoglobin: 7.6 g/dL — ABNORMAL LOW (ref 12.0–15.0)
MCH: 24.2 pg — ABNORMAL LOW (ref 26.0–34.0)
MCHC: 27 g/dL — ABNORMAL LOW (ref 30.0–36.0)
MCV: 89.5 fL (ref 78.0–100.0)
Platelets: 150 10*3/uL (ref 150–400)
RBC: 3.14 MIL/uL — ABNORMAL LOW (ref 3.87–5.11)
RDW: 18.8 % — ABNORMAL HIGH (ref 11.5–15.5)
WBC: 7 10*3/uL (ref 4.0–10.5)

## 2013-08-10 LAB — TYPE AND SCREEN
ABO/RH(D): A POS
Antibody Screen: NEGATIVE
Unit division: 0

## 2013-08-10 LAB — URINALYSIS, ROUTINE W REFLEX MICROSCOPIC
Glucose, UA: NEGATIVE mg/dL
Hgb urine dipstick: NEGATIVE
Ketones, ur: NEGATIVE mg/dL
Leukocytes, UA: NEGATIVE
Nitrite: NEGATIVE
Protein, ur: 30 mg/dL — AB
Specific Gravity, Urine: 1.021 (ref 1.005–1.030)
Urobilinogen, UA: 1 mg/dL (ref 0.0–1.0)
pH: 5 (ref 5.0–8.0)

## 2013-08-10 LAB — BLOOD GAS, ARTERIAL
Acid-Base Excess: 6 mmol/L — ABNORMAL HIGH (ref 0.0–2.0)
Acid-Base Excess: 7.9 mmol/L — ABNORMAL HIGH (ref 0.0–2.0)
Bicarbonate: 34.1 mEq/L — ABNORMAL HIGH (ref 20.0–24.0)
Bicarbonate: 36.2 mEq/L — ABNORMAL HIGH (ref 20.0–24.0)
Delivery systems: POSITIVE
Drawn by: 313941
Drawn by: 313941
Expiratory PAP: 5
FIO2: 0.4 %
Inspiratory PAP: 14
Mode: POSITIVE
O2 Content: 3 L/min
O2 Saturation: 91.1 %
O2 Saturation: 99.9 %
Patient temperature: 98.6
Patient temperature: 97.1
RATE: 15 resp/min
TCO2: 37 mmol/L (ref 0–100)
TCO2: 39.4 mmol/L (ref 0–100)
pCO2 arterial: 95.9 mmHg (ref 35.0–45.0)
pCO2 arterial: 98.8 mmHg (ref 35.0–45.0)
pH, Arterial: 7.176 — CL (ref 7.350–7.450)
pH, Arterial: 7.183 — CL (ref 7.350–7.450)
pO2, Arterial: 74.3 mmHg — ABNORMAL LOW (ref 80.0–100.0)
pO2, Arterial: 116 mmHg — ABNORMAL HIGH (ref 80.0–100.0)

## 2013-08-10 LAB — POCT I-STAT 3, ART BLOOD GAS (G3+)
Acid-Base Excess: 10 mmol/L — ABNORMAL HIGH (ref 0.0–2.0)
Bicarbonate: 38 mEq/L — ABNORMAL HIGH (ref 20.0–24.0)
O2 Saturation: 97 %
Patient temperature: 98
TCO2: 40 mmol/L (ref 0–100)
pCO2 arterial: 70.9 mmHg (ref 35.0–45.0)
pH, Arterial: 7.336 — ABNORMAL LOW (ref 7.350–7.450)
pO2, Arterial: 103 mmHg — ABNORMAL HIGH (ref 80.0–100.0)

## 2013-08-10 LAB — GLUCOSE, CAPILLARY
Glucose-Capillary: 155 mg/dL — ABNORMAL HIGH (ref 70–99)
Glucose-Capillary: 193 mg/dL — ABNORMAL HIGH (ref 70–99)
Glucose-Capillary: 228 mg/dL — ABNORMAL HIGH (ref 70–99)
Glucose-Capillary: 184 mg/dL — ABNORMAL HIGH (ref 70–99)

## 2013-08-10 LAB — TECHNOLOGIST SMEAR REVIEW

## 2013-08-10 LAB — PROCALCITONIN: Procalcitonin: 0.15 ng/mL

## 2013-08-10 LAB — URINE MICROSCOPIC-ADD ON

## 2013-08-10 SURGERY — CREATION, TRACHEOSTOMY
Anesthesia: General | Site: Neck

## 2013-08-10 MED ORDER — PROPOFOL 10 MG/ML IV EMUL
INTRAVENOUS | Status: AC
  Filled 2013-08-10: qty 50

## 2013-08-10 MED ORDER — MIDAZOLAM HCL 2 MG/2ML IJ SOLN
INTRAMUSCULAR | Status: AC
  Filled 2013-08-10: qty 2

## 2013-08-10 MED ORDER — FUROSEMIDE 40 MG PO TABS: 40.0000 mg | ORAL_TABLET | Freq: Every day | ORAL | Status: DC

## 2013-08-10 MED ORDER — SODIUM CHLORIDE 0.9 % IV BOLUS (SEPSIS): 250.0000 mL | Freq: Once | INTRAVENOUS | Status: DC

## 2013-08-10 MED ORDER — DEXTROSE 50 % IV SOLN
INTRAVENOUS | Status: AC
  Filled 2013-08-10: qty 50

## 2013-08-10 MED ORDER — LIDOCAINE HCL (PF) 1 % IJ SOLN
INTRAMUSCULAR | Status: AC
  Filled 2013-08-10: qty 30

## 2013-08-10 MED ORDER — SODIUM BICARBONATE 8.4 % IV SOLN
50.0000 meq | Freq: Once | INTRAVENOUS | Status: AC
  Administered 2013-08-10: 50 meq via INTRAVENOUS
  Filled 2013-08-10 (×2): qty 50

## 2013-08-10 MED ORDER — FENTANYL CITRATE 0.05 MG/ML IJ SOLN
INTRAMUSCULAR | Status: DC | PRN
  Administered 2013-08-10 (×3): 50 ug via INTRAVENOUS
  Administered 2013-08-10: 100 ug via INTRAVENOUS

## 2013-08-10 MED ORDER — INSULIN ASPART 100 UNIT/ML IV SOLN
10.0000 [IU] | Freq: Once | INTRAVENOUS | Status: AC
  Administered 2013-08-10: 10 [IU] via INTRAVENOUS

## 2013-08-10 MED ORDER — SODIUM CHLORIDE 0.9 % IV BOLUS (SEPSIS)
250.0000 mL | Freq: Once | INTRAVENOUS | Status: AC
  Administered 2013-08-10: 250 mL via INTRAVENOUS

## 2013-08-10 MED ORDER — SODIUM CHLORIDE 0.9 % IV SOLN
INTRAVENOUS | Status: DC
  Administered 2013-08-10 – 2013-08-11 (×4): via INTRAVENOUS
  Administered 2013-08-12: 50 mL/h via INTRAVENOUS
  Administered 2013-08-13: 50 mL via INTRAVENOUS

## 2013-08-10 MED ORDER — SUCCINYLCHOLINE CHLORIDE 20 MG/ML IJ SOLN
100.0000 mg | Freq: Once | INTRAMUSCULAR | Status: AC
  Administered 2013-08-10: 100 mg via INTRAVENOUS

## 2013-08-10 MED ORDER — PROPOFOL 10 MG/ML IV BOLUS
100.0000 mg | Freq: Once | INTRAVENOUS | Status: AC
  Administered 2013-08-10: 100 mg via INTRAVENOUS

## 2013-08-10 MED ORDER — ATROPINE SULFATE 0.1 MG/ML IJ SOLN
INTRAMUSCULAR | Status: AC
  Filled 2013-08-10: qty 10

## 2013-08-10 MED ORDER — FENTANYL CITRATE 0.05 MG/ML IJ SOLN
INTRAMUSCULAR | Status: AC
  Administered 2013-08-10: 100 ug
  Filled 2013-08-10: qty 2

## 2013-08-10 MED ORDER — SODIUM POLYSTYRENE SULFONATE 15 GM/60ML PO SUSP
30.0000 g | Freq: Once | ORAL | Status: AC
  Administered 2013-08-10: 30 g via RECTAL
  Filled 2013-08-10 (×2): qty 120

## 2013-08-10 MED ORDER — FENTANYL CITRATE 0.05 MG/ML IJ SOLN
INTRAMUSCULAR | Status: AC
  Administered 2013-08-10: 50 ug
  Filled 2013-08-10: qty 2

## 2013-08-10 MED ORDER — SODIUM BICARBONATE 4.2 % IV SOLN: 50.0000 meq | Freq: Once | INTRAVENOUS | Status: AC

## 2013-08-10 MED ORDER — ETOMIDATE 2 MG/ML IV SOLN
20.0000 mg | Freq: Once | INTRAVENOUS | Status: AC
  Administered 2013-08-10: 20 mg via INTRAVENOUS

## 2013-08-10 MED ORDER — MIDAZOLAM HCL 5 MG/5ML IJ SOLN
INTRAMUSCULAR | Status: DC | PRN
  Administered 2013-08-10: 2 mg via INTRAVENOUS
  Administered 2013-08-10: 1 mg via INTRAVENOUS

## 2013-08-10 MED ORDER — FUROSEMIDE 40 MG PO TABS
40.0000 mg | ORAL_TABLET | Freq: Two times a day (BID) | ORAL | Status: DC
  Administered 2013-08-10: 40 mg via ORAL
  Filled 2013-08-10: qty 1

## 2013-08-10 MED ORDER — METHYLPREDNISOLONE SODIUM SUCC 125 MG IJ SOLR
80.0000 mg | Freq: Two times a day (BID) | INTRAMUSCULAR | Status: DC
  Administered 2013-08-10 – 2013-08-12 (×4): 80 mg via INTRAVENOUS
  Filled 2013-08-10: qty 2
  Filled 2013-08-10 (×5): qty 1.28

## 2013-08-10 MED ORDER — DEXTROSE 5 % IV SOLN
INTRAVENOUS | Status: DC | PRN
  Administered 2013-08-10: 18:00:00 via INTRAVENOUS

## 2013-08-10 MED ORDER — BIOTENE DRY MOUTH MT LIQD
1.0000 "application " | Freq: Four times a day (QID) | OROMUCOSAL | Status: DC
  Administered 2013-08-11 – 2013-08-15 (×19): 15 mL via OROMUCOSAL

## 2013-08-10 MED ORDER — SODIUM CHLORIDE 0.9 % IV BOLUS (SEPSIS)
1000.0000 mL | Freq: Once | INTRAVENOUS | Status: AC
  Administered 2013-08-10: 1000 mL via INTRAVENOUS

## 2013-08-10 MED ORDER — PANTOPRAZOLE SODIUM 40 MG IV SOLR
40.0000 mg | INTRAVENOUS | Status: DC
  Administered 2013-08-10 – 2013-08-11 (×2): 40 mg via INTRAVENOUS
  Filled 2013-08-10 (×3): qty 40

## 2013-08-10 MED ORDER — FENTANYL CITRATE 0.05 MG/ML IJ SOLN
INTRAMUSCULAR | Status: AC
  Filled 2013-08-10: qty 5

## 2013-08-10 MED ORDER — PROPOFOL 10 MG/ML IV EMUL
INTRAVENOUS | Status: AC
  Filled 2013-08-10: qty 100

## 2013-08-10 MED ORDER — MIDAZOLAM HCL 2 MG/2ML IJ SOLN
INTRAMUSCULAR | Status: AC
  Administered 2013-08-10: 2 mg
  Filled 2013-08-10: qty 2

## 2013-08-10 MED ORDER — VECURONIUM BROMIDE 10 MG IV SOLR
INTRAVENOUS | Status: DC | PRN
  Administered 2013-08-10: 10 mg via INTRAVENOUS
  Administered 2013-08-10: 2 mg via INTRAVENOUS

## 2013-08-10 MED ORDER — INSULIN ASPART 100 UNIT/ML ~~LOC~~ SOLN
0.0000 [IU] | SUBCUTANEOUS | Status: DC
  Administered 2013-08-10 (×2): 2 [IU] via SUBCUTANEOUS
  Administered 2013-08-11: 1 [IU] via SUBCUTANEOUS
  Administered 2013-08-11 (×4): 2 [IU] via SUBCUTANEOUS
  Administered 2013-08-12: 1 [IU] via SUBCUTANEOUS
  Administered 2013-08-12: 2 [IU] via SUBCUTANEOUS
  Administered 2013-08-12 (×3): 1 [IU] via SUBCUTANEOUS
  Administered 2013-08-12 (×2): 2 [IU] via SUBCUTANEOUS
  Administered 2013-08-13 (×4): 1 [IU] via SUBCUTANEOUS
  Administered 2013-08-13: 2 [IU] via SUBCUTANEOUS
  Administered 2013-08-14: 1 [IU] via SUBCUTANEOUS
  Administered 2013-08-14: 5 [IU] via SUBCUTANEOUS
  Administered 2013-08-14: 1 [IU] via SUBCUTANEOUS
  Administered 2013-08-14: 2 [IU] via SUBCUTANEOUS
  Administered 2013-08-15 (×2): 5 [IU] via SUBCUTANEOUS
  Administered 2013-08-15 (×3): 2 [IU] via SUBCUTANEOUS

## 2013-08-10 MED ORDER — MIDAZOLAM HCL 2 MG/2ML IJ SOLN
2.0000 mg | INTRAMUSCULAR | Status: DC | PRN
  Administered 2013-08-10: 1 mg via INTRAVENOUS
  Filled 2013-08-10: qty 2

## 2013-08-10 MED ORDER — DEXTROSE 50 % IV SOLN
50.0000 mL | Freq: Once | INTRAVENOUS | Status: AC
Start: 2013-08-10 — End: 2013-08-10
  Administered 2013-08-10: 50 mL via INTRAVENOUS

## 2013-08-10 MED ORDER — CEFAZOLIN SODIUM-DEXTROSE 2-3 GM-% IV SOLR
INTRAVENOUS | Status: DC | PRN
  Administered 2013-08-10: 2 g via INTRAVENOUS

## 2013-08-10 MED ORDER — SUCCINYLCHOLINE CHLORIDE 20 MG/ML IJ SOLN: 10.0000 mg | Freq: Once | INTRAMUSCULAR | Status: DC

## 2013-08-10 MED ORDER — SODIUM BICARBONATE 8.4 % IV SOLN
INTRAVENOUS | Status: AC
  Administered 2013-08-10: 50 meq
  Filled 2013-08-10: qty 50

## 2013-08-10 MED ORDER — CHLORHEXIDINE GLUCONATE 0.12 % MT SOLN
15.0000 mL | Freq: Two times a day (BID) | OROMUCOSAL | Status: DC
  Administered 2013-08-10 – 2013-08-15 (×10): 15 mL via OROMUCOSAL
  Filled 2013-08-10 (×11): qty 15

## 2013-08-10 MED ORDER — CHLORHEXIDINE GLUCONATE 0.12 % MT SOLN
OROMUCOSAL | Status: AC
  Administered 2013-08-10: 15 mL
  Filled 2013-08-10: qty 15

## 2013-08-10 MED ORDER — ARTIFICIAL TEARS OP OINT
TOPICAL_OINTMENT | OPHTHALMIC | Status: DC | PRN
  Administered 2013-08-10: 1 via OPHTHALMIC

## 2013-08-10 MED ORDER — LIDOCAINE HCL (CARDIAC) 20 MG/ML IV SOLN
INTRAVENOUS | Status: DC | PRN
  Administered 2013-08-10 (×2): 50 mg via INTRAVENOUS

## 2013-08-10 MED ORDER — SODIUM CHLORIDE 0.9 % IV SOLN
25.0000 ug/h | INTRAVENOUS | Status: DC
  Administered 2013-08-10: 50 ug/h via INTRAVENOUS
  Filled 2013-08-10: qty 50

## 2013-08-10 SURGICAL SUPPLY — 42 items
BLADE SURG 11 STRL SS (BLADE) IMPLANT
BLADE SURG 15 STRL LF DISP TIS (BLADE) ×1 IMPLANT
BLADE SURG 15 STRL SS (BLADE) ×2
BLADE SURG ROTATE 9660 (MISCELLANEOUS) IMPLANT
CANISTER SUCTION 2500CC (MISCELLANEOUS) ×2 IMPLANT
CLEANER TIP ELECTROSURG 2X2 (MISCELLANEOUS) ×2 IMPLANT
COVER SURGICAL LIGHT HANDLE (MISCELLANEOUS) ×2 IMPLANT
CRADLE DONUT ADULT HEAD (MISCELLANEOUS) IMPLANT
ELECT COATED BLADE 2.86 ST (ELECTRODE) ×2 IMPLANT
ELECT REM PT RETURN 9FT ADLT (ELECTROSURGICAL) ×2
ELECTRODE REM PT RTRN 9FT ADLT (ELECTROSURGICAL) ×1 IMPLANT
GAUZE SPONGE 4X4 16PLY XRAY LF (GAUZE/BANDAGES/DRESSINGS) ×2 IMPLANT
GLOVE ECLIPSE 7.5 STRL STRAW (GLOVE) ×4 IMPLANT
GOWN STRL REUS W/ TWL LRG LVL3 (GOWN DISPOSABLE) ×2 IMPLANT
GOWN STRL REUS W/TWL LRG LVL3 (GOWN DISPOSABLE) ×4
KIT BASIN OR (CUSTOM PROCEDURE TRAY) ×2 IMPLANT
KIT ROOM TURNOVER OR (KITS) ×2 IMPLANT
NDL HYPO 25GX1X1/2 BEV (NEEDLE) ×1 IMPLANT
NEEDLE HYPO 25GX1X1/2 BEV (NEEDLE) ×2 IMPLANT
NS IRRIG 1000ML POUR BTL (IV SOLUTION) ×2 IMPLANT
PAD ARMBOARD 7.5X6 YLW CONV (MISCELLANEOUS) ×4 IMPLANT
PENCIL FOOT CONTROL (ELECTRODE) ×2 IMPLANT
SPONGE DRAIN TRACH 4X4 STRL 2S (GAUZE/BANDAGES/DRESSINGS) IMPLANT
STRIP CLOSURE SKIN 1/2X4 (GAUZE/BANDAGES/DRESSINGS) IMPLANT
SURGILUBE 2OZ TUBE FLIPTOP (MISCELLANEOUS) IMPLANT
SUT CHROMIC GUT 2 0 PS 2 27 (SUTURE) ×2 IMPLANT
SUT ETHILON 3 0 PS 1 (SUTURE) ×2 IMPLANT
SUT SILK 3 0 SH 30 (SUTURE) ×2 IMPLANT
SUT SILK 3 0 TIES 17X18 (SUTURE) ×2
SUT SILK 3-0 18XBRD TIE BLK (SUTURE) ×1 IMPLANT
SUT VIC AB 3-0 PS2 18 (SUTURE) ×2
SUT VIC AB 3-0 PS2 18XBRD (SUTURE) IMPLANT
SUT VIC AB 5-0 P-3 18XBRD (SUTURE) IMPLANT
SUT VIC AB 5-0 P3 18 (SUTURE) ×2
SYR CONTROL 10ML LL (SYRINGE) ×2 IMPLANT
SYRINGE 10CC LL (SYRINGE) ×2 IMPLANT
TOWEL OR 17X24 6PK STRL BLUE (TOWEL DISPOSABLE) ×2 IMPLANT
TOWEL OR 17X26 10 PK STRL BLUE (TOWEL DISPOSABLE) ×2 IMPLANT
TRAY ENT MC OR (CUSTOM PROCEDURE TRAY) ×2 IMPLANT
TUBE CONNECTING 12X1/4 (SUCTIONS) ×2 IMPLANT
TUBE TRACH SHILEY  6 DIST  CUF (TUBING) ×1 IMPLANT
WATER STERILE IRR 1000ML POUR (IV SOLUTION) ×2 IMPLANT

## 2013-08-10 NOTE — Progress Notes (Signed)
Called to bedside to help unit RT place pt on bipap.  Pt tol bipap setting well presently, no resp distress currently noted, pt is able to speak and answer questions approp.  Pt states bipap is comfortable to her presently.  VSS.

## 2013-08-10 NOTE — Transfer of Care (Signed)
Immediate Anesthesia Transfer of Care Note  Patient: Melody Gray  Procedure(s) Performed: Procedure(s): TRACHEOSTOMY (N/A)  Patient Location: SICU  Anesthesia Type:General  Level of Consciousness: sedated, responds to stimulation and Patient remains intubated per anesthesia plan  Airway & Oxygen Therapy: Patient remains intubated per anesthesia plan, Patient placed on Ventilator (see vital sign flow sheet for setting) and Tracheostomy  Post-op Assessment: Report given to SICU RN . VSS. On ventilator .  Post vital signs: Reviewed and stable  Complications: No apparent anesthesia complications

## 2013-08-10 NOTE — Consult Note (Signed)
Was called by Dr.Avva at home for an airway issue and when I arrived the cricothyrotomy surgical airway was already started by Dr. Rosendo Gros. Patient already had had multiple anesthesia Staff  attempts at airway as well as the pulmonary attempt at fiberoptic intubation. I assisted Dr. Rosendo Gros with the surgical airway which was difficult with the patient's anatomy  with a very short fat neck and her sats were already declining into the 70s. A #6 endotracheal tube was inserted.

## 2013-08-10 NOTE — Consult Note (Signed)
Renal Service Consult Note [facility]  Melody Gray 08/10/2013 Sol [place]azing Requesting Physician:  Dr Joya Gaskins  Reason for Consult:  Renal failure HPI: The patient is a 43 y.o. year-old with mulitple medical problems presented to ED on 4/10 with DOE, wt gain 30 lbs over one month, dry cough.  On home O2 since 2011.  Admitted by [facility]. Hb 7.2. Treated with IV lasix for diast HF exacerbation. Good UOP, transfused 1 unit prbc. Troponins were negative. EKG negative. CXR no overt edema. UOP since admission was about 2100 cc.  BP dropped into 90's, creat 2.5 on admission up to 3.53 today. WBC 7, H   Chart review 2001 >> preeclampsia, IUP 37 weeks, pulm edema/PNA w resp failure > Rx w IV Mg and delivery with improvement, had postpartum hemorrhage transfused 3u prbc 2005 >> R breast abcess Rx with I&D and abx 2008 >> Dyspnea , sarcoid flare; bulky LAN chest, perihilar, axillary, upper abd + alveolitis w background pulm fibrosis; treated with steroids; cardiac cath was negative; also +HTN and DM borderline 2010 >> abd pain and fever, ^LFT's, HSM thought sec to sarcoid, steroid induced DM > treated as sarcoid GI flare with increased pred dose, refer to [facility] liver team; note pt had liver bx 2009 + for sarcoidosis, also hep C Ab + in 2009 Dec 2010 >> SOB, leg swelling, stopped her steroids > dx'd possible CHF and/or neph syndrome (3.5gm proteinuria); CT angio showed SM, PAH, RUL edema vs pna, PIF, bronchiectasis, emphysema, and prev noted cirrhosis/splenomegaly; discharged on pred 40/d, nebs, insulin, lasix 40/d, accupril, Toprol, OHG's and others June 2011 >> N/V/D gastoenteritis, new liver lesions, ? Sarcoid, prerenal ARF, DM2, NICM EF 45%, diab retinopathy R eye (Feb 2011) Dec 2011 >> SOB, DOE, possible sarcoid flare treated with inc'd po pred to 40/d ; home O2 as RA sat was only 89%, HTN on lasix and ACEI, mild pulm HTN, DM on insulin and metformin Jan 2013 >> abd pain, N/V,  adrenal insuff, acute renal failure prerenal , creat 2.16 > 0.96 at discharge, sarcoidosis, DM2, UTI, anemia July 2013 >> epistaxis, resp failure acute on chronic, OHS, sarcoidosis > resolved, extubated, etc Mar 2015 >> Comm-acquired PNA, sarcoidosis, DM2, HTN, CHF, CM, pulm HTN, OHS, creat was 0.6-1.1 max, HTN on ARB, coreg , lasix 40/d at d/c      ROS  not available  Past Medical History  Past Medical History  Diagnosis Date  . Sarcoidosis     Followed by Dr. Melvyn Novas; w/ liver involvement per biopsy 12/09, Reversible airway component so started on [facility] 01/2010; HFA 75% p coaching 05/2010  . Hypoxemia     CT angiogram 9/11>> No PE; PFTs 10/11- FEV1 1.20 (49%) with 16% better p B2, DLCO 33%> corrects to 84; O2 sats ok on 4 lpm X rapid walk X 3 laps 05/2010  . Morbid obesity     Target wt= 153 for BMI <30  . QT prolongation   . Diabetes mellitus   . Hypertension   . Hx of cardiac cath 2/08    No CAD, no RAS,  normal EF  . Seborrheic dermatitis of scalp   . 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 Hep B serology neg. but Hep C ab +, HIV neg. 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 Cardiomyopathy, nonischemic     EF 45% 12/10; Echo 7/11  normal EF, PAS 48  . Diabetic retinopathy     Right eye 2/11  . Health maintenance examination     Mammogram 05/2010 Negative; Last Pap smear 03/2008; Last DM eye exam 2/11> mild non-proliferative diabetic retinopathy. OD  . Helicobacter pylori ab+ 05/2011    Pt was symptomatic and treatment planned for 05/2011  . CHF (congestive heart failure)    Past Surgical History  Past Surgical History  Procedure Laterality Date  . Tubal ligation    . Breast surgery    . Cesarean section     Family History  Family History  Problem Relation Age of Onset  . Cancer Mother     colon cancer  . Multiple sclerosis Father   . Asthma Sister     in childhood  . Diabetes Father   .  Hypertension     Social History  reports that she has quit smoking. She has never used smokeless tobacco. She reports that she does not drink alcohol or use illicit drugs. Allergies  Allergies  Allergen Reactions  . Vicodin [Hydrocodone-Acetaminophen] Itching   Home medications Prior to Admission medications   Medication Sig Start Date End Date Taking? Authorizing Provider  carvedilol (COREG) 25 MG tablet take 1 tablet by mouth twice a day with food   Yes Lelon Perla, MD  furosemide (LASIX) 40 MG tablet take 1 tablet by mouth once daily 05/20/13  Yes Jerene Pitch, MD  insulin aspart protamine- aspart (NOVOLOG MIX 70/30) (70-30) 100 UNIT/ML injection Inject 0.25 mLs (25 Units total) into the skin 2 (two) times daily with a meal. Taking 25 in AM and 23 in PM 06/05/13  Yes Jerene Pitch, MD  metFORMIN (GLUCOPHAGE) 1000 MG tablet Take 1 tablet (1,000 mg total) by mouth 2 (two) times daily with a meal. 05/20/13  Yes Jerene Pitch, MD  omeprazole (PRILOSEC) 40 MG capsule take 1 capsule by mouth once daily 05/20/13  Yes Jerene Pitch, MD  valsartan (DIOVAN) 40 MG tablet Take 40 mg by mouth daily.   Yes Historical Provider, MD  irbesartan (AVAPRO) 150 MG tablet Take 2 tablets (300 mg total) by mouth daily. 08/04/13   Rebecca Eaton, MD   Liver Function Tests  Recent Labs Lab 08/04/13 1500  AST 26  ALT 24  ALKPHOS 91  BILITOT 0.6  PROT 6.4  ALBUMIN 3.4*   No results found for this basename: LIPASE, AMYLASE,  in the last 168 hours CBC  Recent Labs Lab 08/08/13 1321 08/09/13 0343 08/10/13 0312  WBC 8.6 7.6 7.0  HGB 7.5* 7.2* 7.6*  HCT 26.7* 26.6* 28.1*  MCV 84.5 85.8 89.5  PLT 182 170 [id number]   Basic Metabolic Panel  Recent Labs Lab 08/04/13 1500 08/08/13 1321 08/08/13 2314 08/09/13 0343 08/10/13 0312 08/10/13 0823  NA 139 143  --  145 141 142  K 4.6 4.8  --  5.0 6.0* 6.7*  CL 93* 97  --  98 97 97  CO2 38* 32  --  35* 36* 37*  GLUCOSE 185* 118*  --  109* 190* 161*   BUN 16 51*  --  47* 54* 57*  CREATININE 1.10 2.53* 2.14* 2.26* 3.44* 3.53*  CALCIUM 8.5 9.3  --  9.5 9.0 8.9    Filed Vitals:   08/10/13 0300 08/10/13 0500 08/10/13 0753 08/10/13 1008  BP: 103/54  94/50   Pulse:    82  Temp: 98.1 F (36.7 C)  97.6 F (36.4 C)   TempSrc: Oral  Oral  Resp: 15  14 19   Height:      Weight:  115.6 kg (254 lb 13.6 oz)    SpO2: 95%  99% 100%   Exam Pt poorly responsive, on BiPAP No rash, cyanosis or gangrene Sclera anicteric, throat clear No JVD Chest bibasilar soft insp rales, no wheezing, no ^WOB RRR no MRG Abd obese, no obvious ascites or HSM 1+ pretibial edema bilat LE's no anasarca Neuro is not responding to voice  UA pending CXR- vasc congestion  Assessment: 1 Acute renal failure- unclear cause, check UA, urine Na. BP's low. Could be cardiorenal if pt has R HF (high risk given her hx).  Has left HF but not severe (EF 45%).  No gross vol excess on exam could have early pulm edema but would not diurese aggressively at this time without other supporting data [facility]  Her K+ is high and is partly due to acute resp acidosis with cellular shifts.  On Bipap now and would repeat K now; if it is worse then she would prob need CRRT today.   2 Acute on chronic resp failure- hx sarcoid (stable now), OHS, pulm HTN 3 Systolic HF EF [id number] 4 DM w retinopathy 5 CKD/proteinuria - 3.5gm 2010, baseline creat 0.6-1.1 in Mar 2015 6 Sarcoidosis (liver/pulm) 7 Morbid obesity 8 DM on insulin 9 Hypotension- hx HTN was on ARB, lasix and coreg at home   Plan- agree with holding lasix, repeat ABG/K+ now that on bipap, may need CRRT   Kelly Splinter MD (pgr) [phone]    [phone] 08/10/2013, 11:51 AM

## 2013-08-10 NOTE — Progress Notes (Signed)
eLink Physician-Brief Progress Note Patient Name: Melody Gray DOB: 1970-12-05 MRN: [id number]  Date of Service  08/10/2013   HPI/Events of Note   Emergent trach Back from OR   eICU Interventions   Vent orders ABG PCXR Fentanyl gtt / Versed PRN GI / VTE / VAP Px NGT Bedside MD to place CVL    Intervention Category Major Interventions: Respiratory failure - evaluation and management  Orlando Devereux 08/10/2013, 8:19 PM

## 2013-08-10 NOTE — Anesthesia Preprocedure Evaluation (Signed)
Anesthesia Evaluation    Airway       Dental   Pulmonary former smoker,          Cardiovascular hypertension,     Neuro/Psych    GI/Hepatic   Endo/Other  diabetes  Renal/GU      Musculoskeletal   Abdominal   Peds  Hematology   Anesthesia Other Findings   Reproductive/Obstetrics                           Anesthesia Physical Anesthesia Plan  ASA: IV and emergent  Anesthesia Plan: General ETT   Post-op Pain Management:    Induction:   Airway Management Planned:   Additional Equipment:   Intra-op Plan:   Post-operative Plan:   Informed Consent:   Plan Discussed with:   Anesthesia Plan Comments:         Anesthesia Quick Evaluation

## 2013-08-10 NOTE — Progress Notes (Signed)
Patient had begun to show increasing lethargy and lack of responsiveness.  Would drift from being able to follow commands to being minimally responsive. Dr. Elsworth Soho was contacted regarding patient's altered mental status.  Decision was made by Dr. Elsworth Soho to perform a rapid intubation.  Patient's sister arrived and the rapid intubation was discussed with the sister and consent was given.  Intubation was difficult and unsuccessful.  After several attempts involving CCM, CCS, and anesthesiology; an emergent crich was performed and patient was taken to the OR.  Patient's family was informed.  Rachel Moulds Ivor Reining

## 2013-08-10 NOTE — Progress Notes (Signed)
Patient transferred to 2S16 on tele accompanied by RT and RN. Vitals remained stable. Report given to Corn, Therapist, sports. Patients sister updated to patients condition and transfer to 2S. Recent bloodgas results called to [facility].

## 2013-08-10 NOTE — Consult Note (Signed)
Reason for Consult: Difficult airway  Referring Physician: Dr. Lacie Scotts Gray is an 43 y.o. female.  HPI: the patient is a 43 year old female. I was consulted by Dr. Elsworth Soho secondary to the patient's difficult airway. Anesthesia was advanced I had attempted multiple times to place the endotracheal airway. Secondary to the patient's short neck and a large tongue multiple attempts were unsuccessful by anesthesia staff.  I was asked to come to bedside secondary to a possible emergent cricothyroidotomy.  Past Medical History  Diagnosis Date  . Sarcoidosis     Followed by Dr. Melvyn Novas; w/ liver involvement per biopsy 12/09, Reversible airway component so started on [facility] 01/2010; HFA 75% p coaching 05/2010  . Hypoxemia     CT angiogram 9/11>> No PE; PFTs 10/11- FEV1 1.20 (49%) with 16% better p B2, DLCO 33%> corrects to 84; O2 sats ok on 4 lpm X rapid walk X 3 laps 05/2010  . Morbid obesity     Target wt= 153 for BMI <30  . QT prolongation   . Diabetes mellitus   . Hypertension   . Hx of cardiac cath 2/08    No CAD, no RAS,  normal EF  . Seborrheic dermatitis of scalp   . 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 Hep B serology neg. but Hep C ab +, HIV neg. 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 Cardiomyopathy, nonischemic     EF 45% 12/10; Echo 7/11 normal EF, PAS 48  . Diabetic retinopathy     Right eye 2/11  . Health maintenance examination     Mammogram 05/2010 Negative; Last Pap smear 03/2008; Last DM eye exam 2/11> mild non-proliferative diabetic retinopathy. OD  . Helicobacter pylori ab+ 05/2011    Pt was symptomatic and treatment planned for 05/2011  . CHF (congestive heart failure)     Past Surgical History  Procedure Laterality Date  . Tubal ligation    . Breast surgery    . Cesarean section      Family History  Problem Relation Age of Onset  . Cancer Mother     colon cancer  . Multiple  sclerosis Father   . Asthma Sister     in childhood  . Diabetes Father   . Hypertension      Social History:  reports that she has quit smoking. She has never used smokeless tobacco. She reports that she does not drink alcohol or use illicit drugs.  Allergies:  Allergies  Allergen Reactions  . Vicodin [Hydrocodone-Acetaminophen] Itching    Medications: I have reviewed the patient's current medications.  Results for orders placed during the hospital encounter of 08/08/13 (from the past 48 hour(s))  MRSA PCR SCREENING     Status: None   Collection Time    08/08/13  8:44 PM      Result Value Ref Range   MRSA by PCR NEGATIVE  NEGATIVE   Comment:            The GeneXpert MRSA Assay (FDA     approved for NASAL specimens     only), is one component of a     comprehensive MRSA colonization     surveillance program. It is not     intended to diagnose MRSA     infection nor to guide or     monitor treatment for     MRSA  infections.  GLUCOSE, CAPILLARY     Status: Abnormal   Collection Time    08/08/13  9:17 PM      Result Value Ref Range   Glucose-Capillary 123 (*) 70 - 99 mg/dL  UREA NITROGEN, URINE     Status: None   Collection Time    08/08/13 11:07 PM      Result Value Ref Range   Urea Nitrogen, Ur 357     Comment: (NOTE)       No established reference range for random urine.     Performed at [facility], URINE, RANDOM     Status: None   Collection Time    08/08/13 11:07 PM      Result Value Ref Range   Creatinine, Urine 56.48    TROPONIN I     Status: None   Collection Time    08/08/13 11:14 PM      Result Value Ref Range   Troponin I <0.30  <0.30 ng/mL   Comment:            Due to the release kinetics of cTnI,     a negative result within the first hours     of the onset of symptoms does not rule out     myocardial infarction with certainty.     If myocardial infarction is still suspected,     repeat the test at appropriate intervals.   CREATININE, SERUM     Status: Abnormal   Collection Time    08/08/13 11:14 PM      Result Value Ref Range   Creatinine, Ser 2.14 (*) 0.50 - 1.10 mg/dL   GFR calc non Af Amer 27 (*) >90 mL/min   GFR calc Af Amer 31 (*) >90 mL/min   Comment: (NOTE)     The eGFR has been calculated using the CKD EPI equation.     This calculation has not been validated in all clinical situations.     eGFR's persistently <90 mL/min signify possible Chronic Kidney     Disease.  BASIC METABOLIC PANEL     Status: Abnormal   Collection Time    08/09/13  3:43 AM      Result Value Ref Range   Sodium 145  137 - 147 mEq/L   Potassium 5.0  3.7 - 5.3 mEq/L   Chloride 98  96 - 112 mEq/L   CO2 35 (*) 19 - 32 mEq/L   Glucose, Bld 109 (*) 70 - 99 mg/dL   BUN 47 (*) 6 - 23 mg/dL   Creatinine, Ser 2.26 (*) 0.50 - 1.10 mg/dL   Calcium 9.5  8.4 - 10.5 mg/dL   GFR calc non Af Amer 25 (*) >90 mL/min   GFR calc Af Amer 29 (*) >90 mL/min   Comment: (NOTE)     The eGFR has been calculated using the CKD EPI equation.     This calculation has not been validated in all clinical situations.     eGFR's persistently <90 mL/min signify possible Chronic Kidney     Disease.  CBC     Status: Abnormal   Collection Time    08/09/13  3:43 AM      Result Value Ref Range   WBC 7.6  4.0 - 10.5 K/uL   RBC 3.10 (*) 3.87 - 5.11 MIL/uL   Hemoglobin 7.2 (*) 12.0 - 15.0 g/dL   HCT 26.6 (*) 36.0 - 46.0 %   MCV 85.8  78.0 -  100.0 fL   MCH 23.2 (*) 26.0 - 34.0 pg   MCHC 27.1 (*) 30.0 - 36.0 g/dL   RDW 19.4 (*) 11.5 - 15.5 %   Platelets 170  150 - 400 K/uL  TROPONIN I     Status: None   Collection Time    08/09/13  3:48 AM      Result Value Ref Range   Troponin I <0.30  <0.30 ng/mL   Comment:            Due to the release kinetics of cTnI,     a negative result within the first hours     of the onset of symptoms does not rule out     myocardial infarction with certainty.     If myocardial infarction is still suspected,      repeat the test at appropriate intervals.  GLUCOSE, CAPILLARY     Status: Abnormal   Collection Time    08/09/13  8:15 AM      Result Value Ref Range   Glucose-Capillary 113 (*) 70 - 99 mg/dL  TROPONIN I     Status: None   Collection Time    08/09/13  9:15 AM      Result Value Ref Range   Troponin I <0.30  <0.30 ng/mL   Comment:            Due to the release kinetics of cTnI,     a negative result within the first hours     of the onset of symptoms does not rule out     myocardial infarction with certainty.     If myocardial infarction is still suspected,     repeat the test at appropriate intervals.  VITAMIN B12     Status: None   Collection Time    08/09/13  9:15 AM      Result Value Ref Range   Vitamin B-12 908  211 - 911 pg/mL   Comment: Performed at [facility]     Status: None   Collection Time    08/09/13  9:15 AM      Result Value Ref Range   Folate >20.0     Comment: (NOTE)     Reference Ranges            Deficient:       0.4 - 3.3 ng/mL            Indeterminate:   3.4 - 5.4 ng/mL            Normal:              > 5.4 ng/mL     Performed at [facility]     Status: Abnormal   Collection Time    08/09/13  9:15 AM      Result Value Ref Range   Iron 27 (*) 42 - 135 ug/dL   TIBC 495 (*) 250 - 470 ug/dL   Saturation Ratios 5 (*) 20 - 55 %   UIBC 468 (*) 125 - 400 ug/dL   Comment: Performed at [facility]     Status: None   Collection Time    08/09/13  9:15 AM      Result Value Ref Range   Ferritin 16  10 - 291 ng/mL   Comment: Performed at [facility]     Status: Abnormal   Collection Time  08/09/13  9:15 AM      Result Value Ref Range   Retic Ct Pct 4.8 (*) 0.4 - 3.1 %   RBC. 3.34 (*) 3.87 - 5.11 MIL/uL   Retic Count, Manual 160.3  19.0 - 186.0 K/uL  GLUCOSE, CAPILLARY     Status: Abnormal   Collection Time    08/09/13 11:30 AM      Result Value Ref Range    Glucose-Capillary 184 (*) 70 - 99 mg/dL  GLUCOSE, CAPILLARY     Status: Abnormal   Collection Time    08/09/13  4:37 PM      Result Value Ref Range   Glucose-Capillary 226 (*) 70 - 99 mg/dL  TYPE AND SCREEN     Status: None   Collection Time    08/09/13  7:52 PM      Result Value Ref Range   ABO/RH(D) A POS     Antibody Screen NEG     Sample Expiration 08/12/2013     Unit Number [id number]     Blood Component Type RED CELLS,LR     Unit division 00     Status of Unit ISSUED,FINAL     Transfusion Status OK TO TRANSFUSE     Crossmatch Result Compatible    PREPARE RBC (CROSSMATCH)     Status: None   Collection Time    08/09/13  7:52 PM      Result Value Ref Range   Order Confirmation ORDER PROCESSED BY BLOOD BANK    GLUCOSE, CAPILLARY     Status: Abnormal   Collection Time    08/09/13  9:22 PM      Result Value Ref Range   Glucose-Capillary 274 (*) 70 - 99 mg/dL  BASIC METABOLIC PANEL     Status: Abnormal   Collection Time    08/10/13  3:12 AM      Result Value Ref Range   Sodium 141  137 - 147 mEq/L   Potassium 6.0 (*) 3.7 - 5.3 mEq/L   Chloride 97  96 - 112 mEq/L   CO2 36 (*) 19 - 32 mEq/L   Glucose, Bld 190 (*) 70 - 99 mg/dL   BUN 54 (*) 6 - 23 mg/dL   Creatinine, Ser 3.44 (*) 0.50 - 1.10 mg/dL   Comment: DELTA CHECK NOTED   Calcium 9.0  8.4 - 10.5 mg/dL   GFR calc non Af Amer 15 (*) >90 mL/min   GFR calc Af Amer 18 (*) >90 mL/min   Comment: (NOTE)     The eGFR has been calculated using the CKD EPI equation.     This calculation has not been validated in all clinical situations.     eGFR's persistently <90 mL/min signify possible Chronic Kidney     Disease.  CBC     Status: Abnormal   Collection Time    08/10/13  3:12 AM      Result Value Ref Range   WBC 7.0  4.0 - 10.5 K/uL   RBC 3.14 (*) 3.87 - 5.11 MIL/uL   Hemoglobin 7.6 (*) 12.0 - 15.0 g/dL   HCT 28.1 (*) 36.0 - 46.0 %   MCV 89.5  78.0 - 100.0 fL   MCH 24.2 (*) 26.0 - 34.0 pg   MCHC 27.0 (*) 30.0 -  36.0 g/dL   RDW 18.8 (*) 11.5 - 15.5 %   Platelets 150  150 - 400 K/uL  GLUCOSE, CAPILLARY     Status: Abnormal   Collection Time  08/10/13  7:43 AM      Result Value Ref Range   Glucose-Capillary 155 (*) 70 - 99 mg/dL  BASIC METABOLIC PANEL     Status: Abnormal   Collection Time    08/10/13  8:23 AM      Result Value Ref Range   Sodium 142  137 - 147 mEq/L   Potassium 6.7 (*) 3.7 - 5.3 mEq/L   Comment: NO VISIBLE HEMOLYSIS     CRITICAL RESULT CALLED TO, READ BACK BY AND VERIFIED WITH:     P.PAYNE,RN 0914 08/10/13 M.CAMPBELL   Chloride 97  96 - 112 mEq/L   CO2 37 (*) 19 - 32 mEq/L   Glucose, Bld 161 (*) 70 - 99 mg/dL   BUN 57 (*) 6 - 23 mg/dL   Creatinine, Ser 3.53 (*) 0.50 - 1.10 mg/dL   Calcium 8.9  8.4 - 10.5 mg/dL   GFR calc non Af Amer 15 (*) >90 mL/min   GFR calc Af Amer 17 (*) >90 mL/min   Comment: (NOTE)     The eGFR has been calculated using the CKD EPI equation.     This calculation has not been validated in all clinical situations.     eGFR's persistently <90 mL/min signify possible Chronic Kidney     Disease.  BLOOD GAS, ARTERIAL     Status: Abnormal   Collection Time    08/10/13  9:18 AM      Result Value Ref Range   O2 Content 3.0     Delivery systems NASAL CANNULA     pH, Arterial 7.176 (*) 7.350 - 7.450   Comment: CRITICAL RESULT CALLED TO, READ BACK BY AND VERIFIED WITH:     PORTIA PAYNE,RN AT 0930 BY JENNIFER KEEN,RRT,RCP ON 08/10/13   pCO2 arterial 95.9 (*) 35.0 - 45.0 mmHg   Comment: CRITICAL RESULT CALLED TO, READ BACK BY AND VERIFIED WITH:     PORTIA PAYNE,RN AT 0930 BY JENNIFER KEEN,RRT,RCP ON 08/10/13   pO2, Arterial 74.3 (*) 80.0 - 100.0 mmHg   Bicarbonate 34.1 (*) 20.0 - 24.0 mEq/L   TCO2 37.0  0 - 100 mmol/L   Acid-Base Excess 6.0 (*) 0.0 - 2.0 mmol/L   O2 Saturation 91.1     Patient temperature 98.6     Collection site RIGHT RADIAL     Drawn by [phone]     Sample type ARTERIAL DRAW     Allens test (pass/fail) PASS  PASS  GLUCOSE,  CAPILLARY     Status: Abnormal   Collection Time    08/10/13 11:59 AM      Result Value Ref Range   Glucose-Capillary 193 (*) 70 - 99 mg/dL  BLOOD GAS, ARTERIAL     Status: Abnormal   Collection Time    08/10/13 12:00 PM      Result Value Ref Range   FIO2 0.40     Delivery systems BILEVEL POSITIVE AIRWAY PRESSURE     Mode BILEVEL POSITIVE AIRWAY PRESSURE     Rate 15     Inspiratory PAP 14.0     Expiratory PAP 5.0     pH, Arterial 7.183 (*) 7.350 - 7.450   Comment: CRITICAL RESULT CALLED TO, READ BACK BY AND VERIFIED WITH:     RAKESH ALVA, MD AT 1238 BY MORGAN CLOSSON,RRT,RCP ON 08/10/13   pCO2 arterial 98.8 (*) 35.0 - 45.0 mmHg   Comment: CRITICAL RESULT CALLED TO, READ BACK BY AND VERIFIED WITH:      RAKESH  ALVA, MD AT 1238 BY MORGAN CLOSSON, RRT,RCP ON 08/10/13   pO2, Arterial 116.0 (*) 80.0 - 100.0 mmHg   Bicarbonate 36.2 (*) 20.0 - 24.0 mEq/L   TCO2 39.4  0 - 100 mmol/L   Acid-Base Excess 7.9 (*) 0.0 - 2.0 mmol/L   O2 Saturation 99.9     Patient temperature 97.1     Collection site RIGHT RADIAL     Drawn by [phone]     Sample type ARTERIAL DRAW     Allens test (pass/fail) PASS  PASS  TECHNOLOGIST SMEAR REVIEW     Status: None   Collection Time    08/10/13 12:55 PM      Result Value Ref Range   Tech Review POLYCHROMASIA PRESENT     Comment: RARE NRBCs  LACTIC ACID, PLASMA     Status: None   Collection Time    08/10/13 12:55 PM      Result Value Ref Range   Lactic Acid, Venous 1.0  0.5 - 2.2 mmol/L  PROCALCITONIN     Status: None   Collection Time    08/10/13 12:55 PM      Result Value Ref Range   Procalcitonin 0.15     Comment:            Interpretation:     PCT (Procalcitonin) <= 0.5 ng/mL:     Systemic infection (sepsis) is not likely.     Local bacterial infection is possible.     (NOTE)             ICU PCT Algorithm               Non ICU PCT Algorithm        ----------------------------     ------------------------------             PCT < 0.25 ng/mL                  PCT < 0.1 ng/mL         Stopping of antibiotics            Stopping of antibiotics           strongly encouraged.               strongly encouraged.        ----------------------------     ------------------------------           PCT level decrease by               PCT < 0.25 ng/mL           >= 80% from peak PCT           OR PCT 0.25 - 0.5 ng/mL          Stopping of antibiotics                                                 encouraged.         Stopping of antibiotics               encouraged.        ----------------------------     ------------------------------           PCT level decrease by              PCT >= 0.25 ng/mL           <  80% from peak PCT            AND PCT >= 0.5 ng/mL            Continuing antibiotics                                                  encouraged.           Continuing antibiotics                encouraged.        ----------------------------     ------------------------------         PCT level increase compared          PCT > 0.5 ng/mL             with peak PCT AND              PCT >= 0.5 ng/mL             Escalation of antibiotics                                              strongly encouraged.          Escalation of antibiotics            strongly encouraged.  BASIC METABOLIC PANEL     Status: Abnormal   Collection Time    08/10/13 12:55 PM      Result Value Ref Range   Sodium 140  137 - 147 mEq/L   Potassium 6.7 (*) 3.7 - 5.3 mEq/L   Comment: CRITICAL RESULT CALLED TO, READ BACK BY AND VERIFIED WITH:     P.MCKINNEY,RN 1430 08/10/13 M.CAMPBELL   Chloride 95 (*) 96 - 112 mEq/L   CO2 31  19 - 32 mEq/L   Glucose, Bld 174 (*) 70 - 99 mg/dL   BUN 57 (*) 6 - 23 mg/dL   Creatinine, Ser 3.36 (*) 0.50 - 1.10 mg/dL   Calcium 8.6  8.4 - 10.5 mg/dL   GFR calc non Af Amer 16 (*) >90 mL/min   GFR calc Af Amer 18 (*) >90 mL/min   Comment: (NOTE)     The eGFR has been calculated using the CKD EPI equation.     This calculation has not been validated  in all clinical situations.     eGFR's persistently <90 mL/min signify possible Chronic Kidney     Disease.  URINALYSIS, ROUTINE W REFLEX MICROSCOPIC     Status: Abnormal   Collection Time    08/10/13  1:37 PM      Result Value Ref Range   Color, Urine YELLOW  YELLOW   APPearance CLOUDY (*) CLEAR   Specific Gravity, Urine 1.021  1.005 - 1.030   pH 5.0  5.0 - 8.0   Glucose, UA NEGATIVE  NEGATIVE mg/dL   Hgb urine dipstick NEGATIVE  NEGATIVE   Bilirubin Urine SMALL (*) NEGATIVE   Ketones, ur NEGATIVE  NEGATIVE mg/dL   Protein, ur 30 (*) NEGATIVE mg/dL   Urobilinogen, UA 1.0  0.0 - 1.0 mg/dL   Nitrite NEGATIVE  NEGATIVE   Leukocytes, UA NEGATIVE  NEGATIVE  URINE MICROSCOPIC-ADD ON     Status: Abnormal  Collection Time    08/10/13  1:37 PM      Result Value Ref Range   Squamous Epithelial / LPF RARE  RARE   WBC, UA 0-2  <3 WBC/hpf   Casts HYALINE CASTS (*) NEGATIVE   Urine-Other AMORPHOUS URATES/PHOSPHATES    GLUCOSE, CAPILLARY     Status: Abnormal   Collection Time    08/10/13  4:23 PM      Result Value Ref Range   Glucose-Capillary 228 (*) 70 - 99 mg/dL   Comment 1 Documented in Chart     Comment 2 Notify RN      Dg Chest 2 View  08/09/2013   CLINICAL DATA:  Volume overload.  EXAM: CHEST  2 VIEW  COMPARISON:  08/08/2013  FINDINGS: Cardiac silhouette is mildly enlarged. No convincing mediastinal or hilar mass or adenopathy. Prominent central pulmonary vascularity, but no overt edema. Lung base opacity is noted bilaterally most consistent with atelectasis  No pneumothorax.  IMPRESSION: 1. Over pulmonary edema. There is mild cardiomegaly and central vascular congestion similar to the previous day's study. 2. Stable basilar atelectasis.   Electronically Signed   By: Lajean Manes M.D.   On: 08/09/2013 09:15   US Renal Port  08/10/2013   CLINICAL DATA:  Renal failure, history of morbid obesity, sarcoidosis, diabetes and hypertension  EXAM: RENAL/URINARY TRACT ULTRASOUND COMPLETE   COMPARISON:  CT ABD W/CM dated 08/08/2008  FINDINGS: The examination is degraded due to patient body habitus and poor sonographic window.  Right Kidney:  Normal cortical thickness, echogenicity and size, measuring 12.9 cm in length. No focal renal lesions. No echogenic renal stones. No urinary obstruction.  Left Kidney:  Normal cortical thickness, echogenicity and size, measuring 12.0 cm in length. No focal renal lesions. No echogenic renal stones. No urinary obstruction.  Bladder:  Appears normal for degree of bladder distention.  The spleen remains enlarged measuring approximately 13.1 x 0.9 x 6.8 cm (volume - 651 mL).  IMPRESSION: 1. No explanation for patient's renal failure. Specifically, no evidence of urinary obstruction. 2. Splenomegaly, similar to remote prior abdominal CT.   Electronically Signed   By: Sandi Mariscal M.D.   On: 08/10/2013 12:31    Review of Systems  Unable to perform ROS: acuity of condition   Blood pressure 93/54, pulse 83, temperature 98.2 F (36.8 C), temperature source Axillary, resp. rate 22, height 5' 1"  (1.549 m), weight 254 lb 13.6 oz (115.6 kg), last menstrual period 08/08/2013, SpO2 97.00%. Physical Exam  Vitals reviewed. Constitutional: She appears well-developed and well-nourished.  HENT:  Head: Normocephalic and atraumatic.  Neck: Neck supple.  Cardiovascular: Normal rate and regular rhythm.   Respiratory: Effort normal.  GI: Soft. Bowel sounds are normal.  Neurological: GCS eye subscore is 1. GCS verbal subscore is 1. GCS motor subscore is 1.    Assessment/Plan: A 43 year old female with a difficult airway 1. I proceeded with a cricothyrotomy as per the procedure note. 2. The patient was taken to the operating room for formalization of a tracheostomy tube.  Ralene Ok 08/10/2013, 7:34 PM

## 2013-08-10 NOTE — Progress Notes (Signed)
Pt was placed on CPAP by RT  per order,  however pt unable to tolerate.  02 sats dropped to upper 70's lower  80s.  Replace 02 prob and checked manual 02 sats  RT called to bedside.  Placed pt  on 6L [place]  Then was able to decrease [place] to 3l.  No distress noted. Will continue to monitor. Melody Gray

## 2013-08-10 NOTE — Progress Notes (Signed)
  Echocardiogram 2D Echocardiogram has been performed.  Carney Corners 08/10/2013, 11:33 AM

## 2013-08-10 NOTE — Progress Notes (Addendum)
Subjective: Pt states that her breathing is back to baseline; however, overnight with CPAP, she desaturated to 81%. She was taken off the CPAP and placed back on nasal canula at 6L. Her O2 saturation improved, but according to her nurse, she has been apneic intermittently.  This morning while she is arousable, she is more lethargic and confused at times.    Objective: Vital signs in last 24 hours: Filed Vitals:   08/10/13 0236 08/10/13 0239 08/10/13 0300 08/10/13 0500  BP:   103/54   Pulse:      Temp:   98.1 F (36.7 C)   TempSrc:   Oral   Resp: 16 14 15    Height:      Weight:    254 lb 13.6 oz (115.6 kg)  SpO2: 85% 96% 95%    Weight change: 1 lb 13.6 oz (0.84 kg)  Intake/Output Summary (Last 24 hours) at 08/10/13 0921 Last data filed at 08/10/13 0600  Gross per 24 hour  Intake    965 ml  Output    850 ml  Net    115 ml   Physical Exam  Vitals reviewed. General: Lying in bed falling asleep while eating her breakfast HEENT: PERRL, EOMI Cardiac: RRR, no rubs, murmurs or gallops Pulm: Clear to auscultation bilaterally, but difficult exam 2/2 body habitus Abd: soft, nontender, nondistended, BS present Ext: warm and well perfused, trace-1+ BLE edema Neuro: Lethargic but arousable. Oriented to person and place but not date or year.    Lab Results: Basic Metabolic Panel:  Recent Labs Lab 08/10/13 0312 08/10/13 0823  NA 141 142  K 6.0* 6.7*  CL 97 97  CO2 36* 37*  GLUCOSE 190* 161*  BUN 54* 57*  CREATININE 3.44* 3.53*  CALCIUM 9.0 8.9   Liver Function Tests:  Recent Labs Lab 08/04/13 1500  AST 26  ALT 24  ALKPHOS 91  BILITOT 0.6  PROT 6.4  ALBUMIN 3.4*   CBC:  Recent Labs Lab 08/09/13 0343 08/10/13 0312  WBC 7.6 7.0  HGB 7.2* 7.6*  HCT 26.6* 28.1*  MCV 85.8 89.5  PLT 170 150   Cardiac Enzymes:  Recent Labs Lab 08/08/13 2314 08/09/13 0348 08/09/13 0915  TROPONINI <0.30 <0.30 <0.30   BNP:  Recent Labs Lab 08/08/13 1402  PROBNP  5862.0*   CBG:  Recent Labs Lab 08/08/13 2117 08/09/13 0815 08/09/13 1130 08/09/13 1637 08/09/13 2122 08/10/13 0743  GLUCAP 123* 113* 184* 226* 274* 155*    Micro Results: Recent Results (from the past 240 hour(s))  MRSA PCR SCREENING     Status: None   Collection Time    08/08/13  8:44 PM      Result Value Ref Range Status   MRSA by PCR NEGATIVE  NEGATIVE Final   Comment:            The GeneXpert MRSA Assay (FDA     approved for NASAL specimens     only), is one component of a     comprehensive MRSA colonization     surveillance program. It is not     intended to diagnose MRSA     infection nor to guide or     monitor treatment for     MRSA infections.   Studies/Results: Dg Chest 2 View  08/09/2013   CLINICAL DATA:  Volume overload.  EXAM: CHEST  2 VIEW  COMPARISON:  08/08/2013  FINDINGS: Cardiac silhouette is mildly enlarged. No convincing mediastinal or hilar mass or adenopathy.  Prominent central pulmonary vascularity, but no overt edema. Lung base opacity is noted bilaterally most consistent with atelectasis  No pneumothorax.  IMPRESSION: 1. Over pulmonary edema. There is mild cardiomegaly and central vascular congestion similar to the previous day's study. 2. Stable basilar atelectasis.   Electronically Signed   By: Lajean Manes M.D.   On: 08/09/2013 09:15   Dg Chest 2 View  08/08/2013   CLINICAL DATA:  EDEMA  EXAM: CHEST  2 VIEW  COMPARISON:  CT ANGIO CHEST W/CM &/OR WO/CM dated 07/25/2013  FINDINGS: Cardiac silhouette is enlarged. There is mild prominence of interstitial markings and peribronchial prominence. There are areas of increased density within the lung bases. Mild blunting of the right costophrenic angle. Osseous structures are unremarkable.  IMPRESSION: Bibasilar infiltrates versus atelectasis. Pulmonary edema. Minimal right pleural effusion.   Electronically Signed   By: Margaree Mackintosh M.D.   On: 08/08/2013 14:14   Medications: I have reviewed the patient's  current medications. Scheduled Meds: . aspirin EC  81 mg Oral Daily  . furosemide  40 mg Oral BID  . heparin  5,000 Units Subcutaneous 3 times per day  . insulin aspart  0-9 Units Subcutaneous TID WC  . insulin glargine  5 Units Subcutaneous QHS  . pantoprazole  40 mg Oral Daily   Continuous Infusions:  PRN Meds:.  Assessment/Plan: Melody Gray is a 43 y.o. woman [facility] patient with PMH of sarcoidosis (on 2L home oxygen), pulmonary HTN, obstructive lung disease, diastolic HF (EF [id number] Nov 2013), CKD, anemia of ckd, HTN and DM type 2 (Hgb A1c 6.05 May 2013) and GERD who presents to the ED with a chief complaint of worsening shortness of breath.   #Lethergy: Unfortunately she became hypoxic while on CPAP overnight. She was placed back on [place] and her O2 sats have returned to normal; however with her lethargy this morning I am concerned that she is retaining CO2. Checking ABG: pH 7.17, pCO2 95, pO2 74, bicarb 34.4. PCCM consulted for her hypercapnia and they recommend starting bipap.  - Starting bipap - F/u with PCCM, appreciate recs  #Acute kidney injury with hyperkalemia: Baseline Cr 0.6-0.8. On admission Cr was 2.53, up from 1.10 on 4/6. FeUrea 31%, suggesting possible pre-renal etiology, but Cr improved with diuresis on 4/11, and we were thinking that her AKI was 2/2 cardiorenal syndrome. However this morning her Cr has continue to worsen, now with hyperkalemia to 6.7. Consulting Renal. - F/u Renal, appreciate recommendations  Creatinine, Ser  Date Value Ref Range Status  08/10/2013 3.53* 0.50 - 1.10 mg/dL Final  08/10/2013 3.44* 0.50 - 1.10 mg/dL Final     DELTA CHECK NOTED  08/09/2013 2.26* 0.50 - 1.10 mg/dL Final   Recent Labs Lab 08/04/13 1500 08/08/13 1321 08/09/13 0343 08/10/13 0312 08/10/13 0823  K 4.6 4.8 5.0 6.0* 6.7*    #Hypotension: Her blood pressure began to soften on 4/11, which was thought to be 2.2 diuresis. Her home blood pressure medications were held overnight and  this morning. The IV Lasix was discontinued. She was also found to be anemic to 7.2 and was transfused 1u pRBCs. Her blood pressure improved slightly with the blood, but have remained low. She had been mentating normally until this morning (changes thought to be 2/2 hypercapnia) and has had no EKG changes, to suggest ischemia. On bipap her BP has improved. Will continue to monitor for now.  #Normocytic anemia in the setting of CHF: Baseline Hgb 8.6-9.8. Hgb 7.5 on admission. 7.2  on 4/11, transfused 1u pRBCs. No signs or symptoms of bleeding. Anemia panel with normal folate and B12. Low iron and mildly elevated TIBC, normal ferritin. Likely related to her CHF - CBC in am    Hemoglobin  Date Value Ref Range Status  08/10/2013 7.6* 12.0 - 15.0 g/dL Final  08/09/2013 7.2* 12.0 - 15.0 g/dL Final  08/08/2013 7.5* 12.0 - 15.0 g/dL Final  08/04/2013 8.4* 12.0 - 15.0 g/dL Final  07/31/2013 8.2 Repeated and verified X2.* 12.0 - 15.0 g/dL Final   #Acute on chronic diastolic heart failure exacerbation: Shortness of breath subjectively improved today and at her baseline. Troponins have been negative. EKG unremarkable. Repeat CXR from 4/12 shows NO overt edema, mild cardiomegaly and central vascular congestion, stable basilar atelectasis. Weight is stable. Converted back to home Lasix. - Lasix 40mg  po qday - Repeating 2D echo to evaluate for evolution of CHF (last was in 11/13) - ASA 81mg  daily  - Coreg 25 mg twice a day  - Daily weights - Strict I/Os - Renal/carb mod diet - PT eval and treat - BMP in am   #Sarcoidosis: She saw her pulmonologist, Dr. Melvyn Novas, on April 4 who saw no evidence of sarcoid activity on her most recent CT and felt there was no need for prednisone at that time. She was on a short course of prednisone after her admission in 3/15 also for dyspnea on exertion (felt likely 2/2 to CAP and Sarcoidosis flare) that was tapered by Dr. Melvyn Novas.   #DM type 2: A1C 7.5 in 04/10 (after a course of steroids  s/p admission in 03/15, previously well controlled). Home medications: 70/30 Novolog 25 units qAM and 23 units qPM, 1000mg  metformin BID.  - Lantus 5 units daily at bedtime  - SSI sensitive  - CBG monitoring  - Holding Metformin   #OHS/OSA: Per Dr. 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 I do not see a sleep study in her chart but will continute to look, she denies ever having one. Attempted CPAP last nigth and was not tolerated, and pt desat'd to 81%. Please see above. - bipap  #GERD: Continue PPI.  #DVT PPx: Heparin subq and SCDs.   Dispo: Disposition is deferred at this time, awaiting improvement of current medical problems.    The patient does have a current PCP Jerene Pitch, MD) and does need an [facility] hospital follow-up appointment after discharge.  The patient does not have transportation limitations that hinder transportation to clinic appointments.  .Services Needed at time of discharge: Y = Yes, Blank = No PT:   OT:   RN:   Equipment:   Other:     LOS: 2 days   Otho Bellows, MD 08/10/2013, 9:21 AM

## 2013-08-10 NOTE — Progress Notes (Signed)
Procedure note:  I was asked from the bedside per anesthesia staff secondary to difficult airway. The patient had had multiple attempts to place a ET-tube. However these were unsuccessful.  Secondary to the above I prepped the patient's neck with ChloraPrep. An 11 blade was used to make a decision at approximate level of the cricothyroid membrane. It was difficult to assess secondary the patient's habitus and subcutaneous tissue. Blunt dissection was taken down to the anterior trachea. It was difficult to assess the cricoid. The limb blade was used to make a small incision I believe was the cricothyroid membrane. The handle of the scalpel was then used to enlarge the incision. I was able to place my finger into the trachea. I placed a 6-1/2 endotracheal tube into the trachea. The endotracheal tube however migrated towards oral pharynx. This was then removed.  Dr. Janace Hoard  Was also able to digitalize the trachea. A bougie was then placed into the trachea. The 6-1/2 endotracheal tube was then placed over this, and into the trachea.. The balloon was insufflated. End tidal CO2 was confirmed. The tube was held in place and the patient was taken to the operating room for formalization of a tracheostomy tube.  It should be noted that Dr. Janace Hoard assisted me with this procedure after the initial incision.

## 2013-08-10 NOTE — Anesthesia Postprocedure Evaluation (Signed)
  Anesthesia Post-op Note  Patient: Melody Gray  Procedure(s) Performed: Procedure(s): TRACHEOSTOMY (N/A)  Patient Location: ICU  Anesthesia Type:General  Level of Consciousness: sedated  Airway and Oxygen Therapy: Patient remains intubated per anesthesia plan  Post-op Pain: none  Post-op Assessment: Post-op Vital signs reviewed, Patient's Cardiovascular Status Stable, Respiratory Function Stable and Patent Airway  Post-op Vital Signs: Reviewed and stable  Last Vitals:  Filed Vitals:   08/10/13 2200  BP: 115/73  Pulse: 77  Temp:   Resp: 14    Complications: No apparent anesthesia complications

## 2013-08-10 NOTE — Progress Notes (Signed)
Dr. Eulas Post notified of AM labs. K6.0, Creat 3.44, Hgb 7.6. New orders received for stat redraw and EKG. RN notified lab and EKG tech.

## 2013-08-10 NOTE — Op Note (Signed)
Pre Operative Diagnosis:  Emergent cricothyroidotomy  Post Operative Diagnosis: tracheostomy  Procedure: formalization of cricothyroidotomy  Surgeon: Dr. Ralene Ok Co-Surgeon: Dr. Janace Hoard  Anesthesia: GETA  EBL: [id number]  Complications: none  Counts: reported as correct x 2  Findings:  Tracheostomy just at teh 1st tracheal ring.  Difficult airway as per Dr. Janace Hoard exam, dictated on a separate dictation  Indications for procedure:  Pt is a 43 y/o F with diffcult airway and difficulty as per Anesthesia and multiple attempts at unsuccessful intubation.  Pt required an emergent cricothyrodotomy.  Details of the procedure:The patient was taken back to the operating room. The patient was placed in supine position with bilateral SCDs in place. After appropriate anitbiotics were confirmed, a time-out was confirmed and all facts were verified.  The wound was extended laterally with electrocautery.    Dissection through the strap muscles was undertaken.  The superior portion of the thryoid was incised.  The ETT was seen to be just at the first tracheal ring.  Dr. Janace Hoard examined the patient's larynx as per his dictation.  It was decided that the tracheostomy tube be placed in the same position.  A #6 Shiley was placed successfully.  The was secured to the skin with 2-0 nylons.  Umbilical tape was used as trach ties.  The patient was taken to the ICU  in guarded condition.

## 2013-08-10 NOTE — Progress Notes (Signed)
Dr Eulas Post notified of new BMET results. K 6.7, Creat 3.53. No new orders received.

## 2013-08-10 NOTE — Procedures (Signed)
Intubation Procedure Note JATORIA MCGATHA [id number] Mar 27, 1971  Procedure: Intubation Indications: Respiratory insufficiency  Procedure Details Consent: Unable to obtain consent because of altered level of consciousness. Time Out: Verified patient identification, verified procedure, site/side was marked, verified correct patient position, special equipment/implants available, medications/allergies/relevent history reviewed, required imaging and test results available.  Performed  Maximum sterile technique was used including gloves, hand hygiene and mask.  MAC and 3  I attempted using glidescope & mac 3 blade after giving 2 mg versed, 100 mcg fentanyl & 20 etomidate. Could not obtain good view. Called anesthesia, who attempted multiple times & finally LMA inserted. Tried to intubate with bronchoscope through LMA but unable to push ETT through. Finally emergent tracheostomy done at bedisde by trauma surgeon dr Rosendo Gros assisted by ENT dr Larita Fife placed , taken to OR for Tstomy conversion. Satn dropped at each attempt but would rebound on bagging.   Evaluation Hemodynamic Status: BP stable throughout; O2 sats: transiently fell during during procedure and currently acceptable Patient's Current Condition: unstable Complications: Complications of airway bleeding Patient did tolerate procedure well. Chest X-ray ordered to verify placement.  CXR: pending.  Sister updated about sequence of events.   Kara Mead MD. Shade Flood. [place] [facility] Pager [phone] If no response call [phone]

## 2013-08-10 NOTE — Progress Notes (Addendum)
140 mL propofol wasted in sharps with Sula Soda, RN.  Rachel Moulds Ivor Reining

## 2013-08-10 NOTE — Progress Notes (Signed)
PT Cancellation Note  Patient Details Name: Melody Gray MRN: [id number] DOB: 1971/01/18   Cancelled Treatment:    Reason Eval/Treat Not Completed: Patient not medically ready.  Noting worsening respiratory and renal function, will defer eval and f/u next date to assess for medical readiness for mobility eval.     Herbie Drape 08/10/2013, 1:00 PM

## 2013-08-10 NOTE — Anesthesia Procedure Notes (Addendum)
Performed by: Jacquiline Doe A   Date/Time: 08/10/2013 5:45 PM Performed by: Jacquiline Doe A Pre-anesthesia Checklist: Patient identified, Suction available, Emergency Drugs available, Patient being monitored and Timeout performed Patient Re-evaluated:Patient Re-evaluated prior to inductionOxygen Delivery Method: Circle system utilized Preoxygenation: Pre-oxygenation with 100% oxygen Intubation Type: Inhalational induction with existing ETT Tube type: #6.0 ETT [place] [facility] . Placement Confirmation: positive ETCO2 and breath sounds checked- equal and bilateral Tube secured with: ETT manually held in place during procedure . Dental Injury: Teeth and Oropharynx as per pre-operative assessment

## 2013-08-10 NOTE — OR Nursing (Signed)
Patient arrived to OR emergently for a tracheostomy.

## 2013-08-10 NOTE — Consult Note (Addendum)
PULMONARY / CRITICAL CARE MEDICINE   Name: Melody Gray MRN: [id number] DOB: May 11, 1970    ADMISSION DATE:  08/08/2013 CONSULTATION DATE:  08/10/13  REFERRING MD :  Marinda Elk ([facility]) PRIMARY SERVICE: IMTS  CHIEF COMPLAINT:  resp failure   BRIEF PATIENT DESCRIPTION: 42yo morbidly obese female active smoker with hx sarcoid (dx 2001) on home O2, NICM, pulm HTN, OSA, CKD admitted 4/10 by IMTS with worsening SOB thought r/t volume overload.  Diuresed with some improvement and was ready for tx to floor.  However 4/12 declined with worsening renal function, hyperkalemia, hypercarbia and placed on bipap and PCCM consulted.   SIGNIFICANT EVENTS / STUDIES:  2D echo 4/12>>>  LINES / TUBES: PIV   CULTURES: none   ANTIBIOTICS: none  HISTORY OF PRESENT ILLNESS:  42yo morbidly obese female active smoker with hx sarcoid on home O2, NICM, pulm HTN, OSA, CKD admitted 4/10 by IMTS with worsening SOB thought r/t volume overload.  Diuresed with some improvement and was ready for tx to floor.  However 4/12 declined with worsening renal function, hyperkalemia, hypercarbia and placed on bipap and PCCM consulted.   Per sister at bedside she has had worsening SOB over last 2-3 weeks.  Saw Dr. Melvyn Novas in March who stopped prednisone.  Pt remains very SOB at baseline with sig DOE, dry cough.  Per sister has not had fevers, purulent sputum, chest pain, hemoptysis, leg/calf pain.  Does frequently c/o abd fullness.   PAST MEDICAL HISTORY :  Past Medical History  Diagnosis Date  . Sarcoidosis     Followed by Dr. Melvyn Novas; w/ liver involvement per biopsy 12/09, Reversible airway component so started on [facility] 01/2010; HFA 75% p coaching 05/2010  . Hypoxemia     CT angiogram 9/11>> No PE; PFTs 10/11- FEV1 1.20 (49%) with 16% better p B2, DLCO 33%> corrects to 84; O2 sats ok on 4 lpm X rapid walk X 3 laps 05/2010  . Morbid obesity     Target wt= 153 for BMI <30  . QT prolongation   . Diabetes mellitus   . Hypertension    . Hx of cardiac cath 2/08    No CAD, no RAS,  normal EF  . Seborrheic dermatitis of scalp   . 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 Hep B serology neg. but Hep C ab +, HIV neg. 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 Cardiomyopathy, nonischemic     EF 45% 12/10; Echo 7/11 normal EF, PAS 48  . Diabetic retinopathy     Right eye 2/11  . Health maintenance examination     Mammogram 05/2010 Negative; Last Pap smear 03/2008; Last DM eye exam 2/11> mild non-proliferative diabetic retinopathy. OD  . Helicobacter pylori ab+ 05/2011    Pt was symptomatic and treatment planned for 05/2011  . CHF (congestive heart failure)    Past Surgical History  Procedure Laterality Date  . Tubal ligation    . Breast surgery    . Cesarean section     Prior to Admission medications   Medication Sig Start Date End Date Taking? Authorizing Provider  carvedilol (COREG) 25 MG tablet take 1 tablet by mouth twice a day with food   Yes Lelon Perla, MD  furosemide (LASIX) 40 MG tablet take 1 tablet by mouth once daily 05/20/13  Yes Jerene Pitch, MD  insulin aspart protamine- aspart (NOVOLOG  MIX 70/30) (70-30) 100 UNIT/ML injection Inject 0.25 mLs (25 Units total) into the skin 2 (two) times daily with a meal. Taking 25 in AM and 23 in PM 06/05/13  Yes Jerene Pitch, MD  metFORMIN (GLUCOPHAGE) 1000 MG tablet Take 1 tablet (1,000 mg total) by mouth 2 (two) times daily with a meal. 05/20/13  Yes Jerene Pitch, MD  omeprazole (PRILOSEC) 40 MG capsule take 1 capsule by mouth once daily 05/20/13  Yes Jerene Pitch, MD  valsartan (DIOVAN) 40 MG tablet Take 40 mg by mouth daily.   Yes Historical Provider, MD  irbesartan (AVAPRO) 150 MG tablet Take 2 tablets (300 mg total) by mouth daily. 08/04/13   Rebecca Eaton, MD   Allergies  Allergen Reactions  . Vicodin [Hydrocodone-Acetaminophen] Itching    FAMILY HISTORY:  Family History   Problem Relation Age of Onset  . Cancer Mother     colon cancer  . Multiple sclerosis Father   . Asthma Sister     in childhood  . Diabetes Father   . Hypertension     SOCIAL HISTORY:  reports that she has quit smoking. She has never used smokeless tobacco. She reports that she does not drink alcohol or use illicit drugs.  REVIEW OF SYSTEMS:  Obtained from records and sister at bedside.  As per HPI - All other systems reviewed and were neg.    VITAL SIGNS: Temp:  [97.5 F (36.4 C)-98.9 F (37.2 C)] 97.6 F (36.4 C) (04/12 0753) Pulse Rate:  [71-90] 82 (04/12 1008) Resp:  [12-22] 19 (04/12 1008) BP: (55-106)/(27-67) 94/50 mmHg (04/12 0753) SpO2:  [81 %-100 %] 100 % (04/12 1008) FiO2 (%):  [40 %] 40 % (04/12 1008) Weight:  [254 lb 13.6 oz (115.6 kg)] 254 lb 13.6 oz (115.6 kg) (04/12 0500) HEMODYNAMICS:   VENTILATOR SETTINGS: Vent Mode:  [-] BIPAP;PCV FiO2 (%):  [40 %] 40 % Set Rate:  [15 bmp] 15 bmp PEEP:  [5 cmH20] 5 cmH20 INTAKE / OUTPUT: Intake/Output     04/11 0701 - 04/12 0700 04/12 0701 - 04/13 0700   P.O. 700 120   I.V. (mL/kg) 50 (0.4)    Blood 335    Total Intake(mL/kg) 1085 (9.4) 120 (1)   Urine (mL/kg/hr) 1350 (0.5)    Total Output 1350     Net -265 +120          PHYSICAL EXAMINATION: General:  Morbidly obese, chronically ill appearing female, NAD on bipap  Neuro:  Lethargic but arousable, answers questions appropriately, MAE, follows commands  HEENT:  Mm dry no JVD, bipap  Cardiovascular:  s1s2 rrr Lungs:  resps even non labored on bipap, diminished throughout  Abdomen:  Obese, soft, mildly distended, +bs Musculoskeletal:  Warm and dry, 1+ BLE edema   LABS:  CBC  Recent Labs Lab 08/08/13 1321 08/09/13 0343 08/10/13 0312  WBC 8.6 7.6 7.0  HGB 7.5* 7.2* 7.6*  HCT 26.7* 26.6* 28.1*  PLT 182 170 150   Coag's No results found for this basename: APTT, INR,  in the last 168 hours BMET  Recent Labs Lab 08/09/13 0343 08/10/13 0312  08/10/13 0823  NA 145 141 142  K 5.0 6.0* 6.7*  CL 98 97 97  CO2 35* 36* 37*  BUN 47* 54* 57*  CREATININE 2.26* 3.44* 3.53*  GLUCOSE 109* 190* 161*   Electrolytes  Recent Labs Lab 08/09/13 0343 08/10/13 0312 08/10/13 0823  CALCIUM 9.5 9.0 8.9   Sepsis Markers No results found for this  basename: LATICACIDVEN, PROCALCITON, O2SATVEN,  in the last 168 hours ABG  Recent Labs Lab 08/10/13 0918  PHART 7.176*  PCO2ART 95.9*  PO2ART 74.3*   Liver Enzymes  Recent Labs Lab 08/04/13 1500  AST 26  ALT 24  ALKPHOS 91  BILITOT 0.6  ALBUMIN 3.4*   Cardiac Enzymes  Recent Labs Lab 08/08/13 1402 08/08/13 2314 08/09/13 0348 08/09/13 0915  TROPONINI  --  <0.30 <0.30 <0.30  PROBNP 5862.0*  --   --   --    Glucose  Recent Labs Lab 08/08/13 2117 08/09/13 0815 08/09/13 1130 08/09/13 1637 08/09/13 2122 08/10/13 0743  GLUCAP 123* 113* 184* 226* 274* 155*    Imaging Dg Chest 2 View  08/09/2013   CLINICAL DATA:  Volume overload.  EXAM: CHEST  2 VIEW  COMPARISON:  08/08/2013  FINDINGS: Cardiac silhouette is mildly enlarged. No convincing mediastinal or hilar mass or adenopathy. Prominent central pulmonary vascularity, but no overt edema. Lung base opacity is noted bilaterally most consistent with atelectasis  No pneumothorax.  IMPRESSION: 1. Over pulmonary edema. There is mild cardiomegaly and central vascular congestion similar to the previous day's study. 2. Stable basilar atelectasis.   Electronically Signed   By: Lajean Manes M.D.   On: 08/09/2013 09:15   Dg Chest 2 View  08/08/2013   CLINICAL DATA:  EDEMA  EXAM: CHEST  2 VIEW  COMPARISON:  CT ANGIO CHEST W/CM &/OR WO/CM dated 07/25/2013  FINDINGS: Cardiac silhouette is enlarged. There is mild prominence of interstitial markings and peribronchial prominence. There are areas of increased density within the lung bases. Mild blunting of the right costophrenic angle. Osseous structures are unremarkable.  IMPRESSION: Bibasilar  infiltrates versus atelectasis. Pulmonary edema. Minimal right pleural effusion.   Electronically Signed   By: Margaree Mackintosh M.D.   On: 08/08/2013 14:14      ASSESSMENT / PLAN: Principal Problem:   Acute on chronic respiratory failure Active Problems:   Sarcoidosis   Diabetes mellitus type II, controlled   OBESITY, MORBID   Anemia in chronic kidney disease(285.21)   NICOTINE ADDICTION   Respiratory failure, chronic   NEPHROTIC SYNDROME   LONG QT SYNDROME   Pulmonary hypertension associated with sarcoidosis   Cardiomyopathy   Volume overload   Acute renal failure   Hyperkalemia   PULMONARY Acute on chronic hypercarbic respiratory failure  Sarcoid  OSA/OHS  Tobacco use Long standing non adherence to medical advice , see clinic records P:   tx ICU  Cont bipap for now and mandatory qhs  F/u ABG  Start IV MEDROL Smoking cessation  F/u CXR  CARDIOVASCULAR Acute on chronic dCHF  Hypotension  P:  Hold further diuresis for now  2D echo pending  Lactate, pct  Hold anti-htn , d/c ARB Replace volume gently   RENAL Acute on chronic renal failure  (baseline Scr ~0.9) Hyperkalemia  ?cardiorenal P:   Renal ultrasound  F/u chem at noon and in am  Renal to see  Give one dose bicarb  GASTROINTESTINAL A:  No active issue  P:   NPO for now   HEMATOLOGIC Anemia  P:  F/u cbc   INFECTIOUS No s/s active infection  P:   Pct  Monitor fever, wbc off abx   ENDOCRINE DM  P:   Lantus  ssi   NEUROLOGIC AMS - lethargy in setting hypercarbia - improved with bipap  P:   Supportive care  Avoid sedating meds    I have personally obtained a history, examined  the patient, evaluated laboratory and imaging results, formulated the assessment and plan and placed orders. CRITICAL CARE: The patient is critically ill with multiple organ systems failure and requires high complexity decision making for assessment and support, frequent evaluation and titration of therapies,  application of advanced monitoring technologies and extensive interpretation of multiple databases. Critical Care Time devoted to patient care services described in this note is  40  minutes.    Marijean Heath, NP 08/10/2013  10:49 AM Pager: [phone] or [phone]  *Care during the described time interval was provided by me and/or other providers on the critical care team. I have reviewed this patient's available data, including medical history, events of note, physical examination and test results as part of my evaluation.  Pt seen and examined as above.   Pt with ARF, pulmonary sarcoid, probably dry, overdiuresed, effect of ARB on the renal function and hypoxia.   Plan to cont bipap, give bicarb, give fluids, steroids, tfr to ICU and PCCM primary svc   Vernia Buff  [id number]  Cell  [phone]  If no response or cell goes to voicemail, call beeper [phone]   08/10/2013 12:11 PM

## 2013-08-10 NOTE — Progress Notes (Signed)
Dr. Eulas Post notified of stat ABG results. Ph 7.1 PCO2 95.9 PO2 74 Bicarb 34

## 2013-08-10 NOTE — Op Note (Signed)
Preoperative/postoperative diagnosis: Emergency airway respiratory failure Procedure: Revision of emergent tracheotomy and repair of thyroid cartilage Anesthesia: Gen. Co-surgeon: Dr. Ramirez/Dr. Janace Hoard Indications: 43 year old who had respiratory failure acutely in the intensive care unit and after multiple attempts from anesthesia and pulmonary surgery was called for a surgical airway. The patient had an extremely difficult airway with thick neck and very low lying trachea. She had a emergent airway placed by Dr. Rosendo Gros. She had a #6 endotracheal tube in place and was brought to the operating room for reposition revision of the emergent airway. Procedure: Patient was taken to the operating room and the procedure for dissection and revision the trach to be dictated by separate op report by Dr. Rosendo Gros. On examination the patient had a inferior thyroid cartilage thyrotomy that look like was at the subglottis level but could not be completely identified as to where the cords were. The mucosa that was identified was positioned and sutured at the anterior aspect of the thyroid cartilage and then the thyroid cartilage was repositioned and sutured back in its midline with Vicryl suture 30. Attempt to examine the vocal cords with fiberoptic bronchoscope as well as glide scope was unsuccessful as there was so much swelling of the upper airway and she has extremely small mouth, large tongue, and very anterior larynx. What could be visualized was exceedingly significant edema of the mucosa of the airway and it appeared that the best view is just the posterior aspect of the very edematous arytenoids. Remaining operative report to be dictated by Dr. Rosendo Gros.

## 2013-08-10 NOTE — OR Nursing (Signed)
Tracheostomy inner cannula placed in specimen bag and taped to the head of patient's bed by Vedia Coffer, RN.

## 2013-08-10 NOTE — Progress Notes (Signed)
CRITICAL VALUE ALERT  Critical value received: K+6.7  Date of notification:  08/10/2013  Time of notification:  1430  Critical value read back:yes  Nurse who received alert:  Loma Newton rn  MD notified (1st page):  Dr Jonnie Finner  Time of first page:  1431  MD notified (2nd page):  Time of second page:  Responding MD:  Dr Jonnie Finner  Time MD responded:  1434

## 2013-08-11 ENCOUNTER — Inpatient Hospital Stay (HOSPITAL_COMMUNITY): Payer: Medicare Other

## 2013-08-11 ENCOUNTER — Encounter (HOSPITAL_COMMUNITY): Payer: Self-pay | Admitting: Otolaryngology

## 2013-08-11 DIAGNOSIS — I5033 Acute on chronic diastolic (congestive) heart failure: Secondary | ICD-10-CM | POA: Diagnosis not present

## 2013-08-11 DIAGNOSIS — E875 Hyperkalemia: Secondary | ICD-10-CM | POA: Diagnosis not present

## 2013-08-11 DIAGNOSIS — E872 Acidosis: Secondary | ICD-10-CM | POA: Diagnosis not present

## 2013-08-11 DIAGNOSIS — D869 Sarcoidosis, unspecified: Secondary | ICD-10-CM | POA: Diagnosis not present

## 2013-08-11 DIAGNOSIS — J9620 Acute and chronic respiratory failure, unspecified whether with hypoxia or hypercapnia: Secondary | ICD-10-CM | POA: Diagnosis not present

## 2013-08-11 DIAGNOSIS — J9600 Acute respiratory failure, unspecified whether with hypoxia or hypercapnia: Secondary | ICD-10-CM | POA: Diagnosis not present

## 2013-08-11 DIAGNOSIS — R579 Shock, unspecified: Secondary | ICD-10-CM | POA: Diagnosis not present

## 2013-08-11 DIAGNOSIS — I2789 Other specified pulmonary heart diseases: Secondary | ICD-10-CM | POA: Diagnosis not present

## 2013-08-11 DIAGNOSIS — N179 Acute kidney failure, unspecified: Secondary | ICD-10-CM | POA: Diagnosis not present

## 2013-08-11 LAB — BASIC METABOLIC PANEL
BUN: 53 mg/dL — ABNORMAL HIGH (ref 6–23)
BUN: 45 mg/dL — ABNORMAL HIGH (ref 6–23)
CO2: 33 mEq/L — ABNORMAL HIGH (ref 19–32)
CO2: 36 mEq/L — ABNORMAL HIGH (ref 19–32)
Calcium: 9.3 mg/dL (ref 8.4–10.5)
Calcium: 9.3 mg/dL (ref 8.4–10.5)
Chloride: 96 mEq/L (ref 96–112)
Chloride: 100 mEq/L (ref 96–112)
Creatinine, Ser: 2.68 mg/dL — ABNORMAL HIGH (ref 0.50–1.10)
Creatinine, Ser: 2.07 mg/dL — ABNORMAL HIGH (ref 0.50–1.10)
GFR calc Af Amer: 24 mL/min — ABNORMAL LOW (ref >90)
GFR calc Af Amer: 32 mL/min — ABNORMAL LOW (ref >90)
GFR calc non Af Amer: 20 mL/min — ABNORMAL LOW (ref >90)
GFR calc non Af Amer: 28 mL/min — ABNORMAL LOW (ref >90)
Glucose, Bld: 181 mg/dL — ABNORMAL HIGH (ref 70–99)
Glucose, Bld: 151 mg/dL — ABNORMAL HIGH (ref 70–99)
Potassium: 5.6 mEq/L — ABNORMAL HIGH (ref 3.7–5.3)
Potassium: 5.1 mEq/L (ref 3.7–5.3)
Sodium: 141 mEq/L (ref 137–147)
Sodium: 146 mEq/L (ref 137–147)

## 2013-08-11 LAB — GLUCOSE, CAPILLARY
Glucose-Capillary: 172 mg/dL — ABNORMAL HIGH (ref 70–99)
Glucose-Capillary: 178 mg/dL — ABNORMAL HIGH (ref 70–99)
Glucose-Capillary: 178 mg/dL — ABNORMAL HIGH (ref 70–99)
Glucose-Capillary: 146 mg/dL — ABNORMAL HIGH (ref 70–99)
Glucose-Capillary: 159 mg/dL — ABNORMAL HIGH (ref 70–99)

## 2013-08-11 LAB — PROCALCITONIN: Procalcitonin: 0.12 ng/mL

## 2013-08-11 LAB — CORTISOL: Cortisol, Plasma: 12 ug/dL

## 2013-08-11 MED ORDER — SODIUM CHLORIDE 0.9 % IJ SOLN: 3.0000 mL | INTRAMUSCULAR | Status: DC | PRN

## 2013-08-11 MED ORDER — FENTANYL CITRATE 0.05 MG/ML IJ SOLN
25.0000 ug | INTRAMUSCULAR | Status: DC | PRN
  Administered 2013-08-11 (×4): 50 ug via INTRAVENOUS
  Administered 2013-08-12 (×2): 100 ug via INTRAVENOUS
  Administered 2013-08-12 (×4): 50 ug via INTRAVENOUS
  Administered 2013-08-13: 100 ug via INTRAVENOUS
  Administered 2013-08-13: 50 ug via INTRAVENOUS
  Filled 2013-08-11 (×10): qty 2

## 2013-08-11 NOTE — Progress Notes (Signed)
PULMONARY / CRITICAL CARE MEDICINE   Name: Melody Gray MRN: [id number] DOB: Apr 10, 1971    ADMISSION DATE:  08/08/2013 CONSULTATION DATE:  08/10/13  REFERRING MD :  Marinda Elk ([facility]) PRIMARY SERVICE: IMTS  CHIEF COMPLAINT:  resp failure   BRIEF PATIENT DESCRIPTION: 43yo morbidly obese female active smoker with hx sarcoid (dx 2001 -followed by dr Melvyn Novas) on home O2, NICM, pulm HTN, OSA, CKD admitted 4/10 by IMTS with worsening SOB thought r/t volume overload.  Diuresed with some improvement and was ready for tx to floor.  However 4/12 declined with worsening renal function, hyperkalemia, hypercarbia and placed on bipap and PCCM consulted.   SIGNIFICANT EVENTS / STUDIES:  2D echo 4/12>>>nml LV fn, RVSP 48   LINES / TUBES: 4/12 Emergent Tstomy (Trauma/ Janace Hoard - difficult airway) >> Good UO  CULTURES: none   ANTIBIOTICS: none  Subj - some pain at trach site Afebrile  VITAL SIGNS: Temp:  [97.4 F (36.3 C)-98.8 F (37.1 C)] 98.8 F (37.1 C) (04/13 0721) Pulse Rate:  [53-95] 84 (04/13 0800) Resp:  [0-50] 15 (04/13 0800) BP: (59-155)/(21-114) 136/72 mmHg (04/13 0800) SpO2:  [41 %-100 %] 100 % (04/13 0800) FiO2 (%):  [40 %-50 %] 40 % (04/13 0800) Weight:  [117 kg (257 lb 15 oz)] 117 kg (257 lb 15 oz) (04/13 0500) HEMODYNAMICS:   VENTILATOR SETTINGS: Vent Mode:  [-] PRVC FiO2 (%):  [40 %-50 %] 40 % Set Rate:  [14 bmp-15 bmp] 14 bmp Vt Set:  [500 mL] 500 mL PEEP:  [5 cmH20] 5 cmH20 Plateau Pressure:  [24 cmH20-30 cmH20] 24 cmH20 INTAKE / OUTPUT: Intake/Output     04/12 0701 - 04/13 0700 04/13 0701 - 04/14 0700   P.O. 120    I.V. (mL/kg) 2547.5 (21.8) 77.5 (0.7)   Blood     NG/GT 90 30   Total Intake(mL/kg) 2757.5 (23.6) 107.5 (0.9)   Urine (mL/kg/hr) 2670 (1) 50 (0.2)   Blood 100 (0)    Total Output 2770 50   Net -12.5 +57.5          PHYSICAL EXAMINATION: General:  Morbidly obese, chronically ill appearing female, NAD on vent Neuro:  Lethargic but arousable,  answers questions appropriately, MAE, follows commands  HEENT:  Mm dry no JVD, bleeding at tstomy site Cardiovascular:  s1s2 rrr Lungs:  resps even non labored , diminished throughout  Abdomen:  Obese, soft, mildly distended, +bs Musculoskeletal:  Warm and dry, 1+ BLE edema   LABS:  CBC  Recent Labs Lab 08/08/13 1321 08/09/13 0343 08/10/13 0312  WBC 8.6 7.6 7.0  HGB 7.5* 7.2* 7.6*  HCT 26.7* 26.6* 28.1*  PLT 182 170 150   Coag's No results found for this basename: APTT, INR,  in the last 168 hours BMET  Recent Labs Lab 08/10/13 1255 08/10/13 2130 08/11/13 0230  NA 140 143 141  K 6.7* 6.0* 5.6*  CL 95* 97 96  CO2 31 35* 33*  BUN 57* 56* 53*  CREATININE 3.36* 3.28* 2.68*  GLUCOSE 174* 179* 181*   Electrolytes  Recent Labs Lab 08/10/13 1255 08/10/13 2130 08/11/13 0230  CALCIUM 8.6 8.9 9.3   Sepsis Markers  Recent Labs Lab 08/10/13 1255 08/11/13 0230  LATICACIDVEN 1.0  --   PROCALCITON 0.15 0.12   ABG  Recent Labs Lab 08/10/13 0918 08/10/13 1200 08/10/13 2051  PHART 7.176* 7.183* 7.336*  PCO2ART 95.9* 98.8* 70.9*  PO2ART 74.3* 116.0* 103.0*   Liver Enzymes  Recent Labs Lab 08/04/13  1500  AST 26  ALT 24  ALKPHOS 91  BILITOT 0.6  ALBUMIN 3.4*   Cardiac Enzymes  Recent Labs Lab 08/08/13 1402 08/08/13 2314 08/09/13 0348 08/09/13 0915  TROPONINI  --  <0.30 <0.30 <0.30  PROBNP 5862.0*  --   --   --    Glucose  Recent Labs Lab 08/10/13 1159 08/10/13 1623 08/10/13 2039 08/10/13 2303 08/11/13 0317 08/11/13 0722  GLUCAP 193* 228* 184* 172* 178* 178*    Imaging US Renal Port  08/10/2013   CLINICAL DATA:  Renal failure, history of morbid obesity, sarcoidosis, diabetes and hypertension  EXAM: RENAL/URINARY TRACT ULTRASOUND COMPLETE  COMPARISON:  CT ABD W/CM dated 08/08/2008  FINDINGS: The examination is degraded due to patient body habitus and poor sonographic window.  Right Kidney:  Normal cortical thickness, echogenicity and  size, measuring 12.9 cm in length. No focal renal lesions. No echogenic renal stones. No urinary obstruction.  Left Kidney:  Normal cortical thickness, echogenicity and size, measuring 12.0 cm in length. No focal renal lesions. No echogenic renal stones. No urinary obstruction.  Bladder:  Appears normal for degree of bladder distention.  The spleen remains enlarged measuring approximately 13.1 x 0.9 x 6.8 cm (volume - 651 mL).  IMPRESSION: 1. No explanation for patient's renal failure. Specifically, no evidence of urinary obstruction. 2. Splenomegaly, similar to remote prior abdominal CT.   Electronically Signed   By: Sandi Mariscal M.D.   On: 08/10/2013 12:31   Dg Chest Portable 1 View  08/11/2013   CLINICAL DATA:  Respiratory failure.  EXAM: PORTABLE CHEST - 1 VIEW  COMPARISON:  08/10/2013  FINDINGS: The heart is enlarged but stable. Persistent vascular congestion and probable pulmonary edema. The tracheostomy tube is stable. The NG tube is stable. Bibasilar atelectasis persists. No large pleural effusion.  IMPRESSION: Persistent vascular congestion and probable pulmonary edema along the bibasilar atelectasis.   Electronically Signed   By: Kalman Jewels M.D.   On: 08/11/2013 07:38   Dg Chest Port 1 View  08/10/2013   CLINICAL DATA:  Post tracheostomy, NG tube placement  EXAM: PORTABLE CHEST - 1 VIEW  COMPARISON:  DG CHEST 2 VIEW dated 08/09/2013; DG CHEST 2 VIEW dated 08/08/2013; CT ANGIO CHEST W/CM &/OR WO/CM dated 07/25/2013  FINDINGS: Moderately severe cardiac enlargement. Tracheostomy tube projects over the tracheal air column. Mild bibasilar opacities likely representing atelectasis. NG tube is identified in extends off the inferior edge of the film, below the level of the diaphragm.  IMPRESSION: Lines and tubes as described. Bibasilar interstitial opacities not significantly different from prior study allowing for change in projection.   Electronically Signed   By: Skipper Cliche M.D.   On: 08/10/2013  20:46   pCXR - edema pattern   ASSESSMENT / PLAN: Principal Problem:   Acute on chronic respiratory failure Active Problems:   Sarcoidosis   Diabetes mellitus type II, controlled   OBESITY, MORBID   Anemia in chronic kidney disease(285.21)   NICOTINE ADDICTION   Respiratory failure, chronic   NEPHROTIC SYNDROME   LONG QT SYNDROME   Pulmonary hypertension associated with sarcoidosis   Cardiomyopathy   Volume overload   Acute renal failure   Hyperkalemia   PULMONARY Acute on chronic hypercarbic respiratory failure  Sarcoid  OSA/OHS - Tstomy emergent 4/12  Tobacco use Long standing non adherence to medical advice , see clinic records P:   Ct solumedrol Smoking cessation  SBTs with goal ATC as tolerated daytime Unfortunately expect some vocal cord damage  here - long term effects on speech unclear.   CARDIOVASCULAR Acute on chronic dCHF  Hypotension  P:  Hold further diuresis for now  Hold anti-htn , d/c ARB Replace volume gently   RENAL Acute on chronic renal failure  (baseline Scr ~0.9) -related to ARB, miproving  Renal ultrasound no obs Hyperkalemia -improved ?cardiorenal P:   Renal following Ct IVFs   GASTROINTESTINAL A:  No active issue  P:   Start TFs  HEMATOLOGIC Anemia  P:  F/u cbc   INFECTIOUS No s/s active infection , low pct reassuring P:   Monitor fever, wbc off abx   ENDOCRINE DM  P:   Lantus  ssi   NEUROLOGIC AMS - lethargy in setting hypercarbia - improved  P:   Supportive care  Avoid sedating meds -fent prn ok for pain  Summary - renal failure resolving, expect to wean quickly to trach collar, remains to be seen if she will require nocturnal vent for OHS. Unfortunately expect some vocal cord damage here - long term effects on speech unclear.   I have personally obtained a history, examined the patient, evaluated laboratory and imaging results, formulated the assessment and plan and placed orders. CRITICAL CARE: The  patient is critically ill with multiple organ systems failure and requires high complexity decision making for assessment and support, frequent evaluation and titration of therapies, application of advanced monitoring technologies and extensive interpretation of multiple databases. Critical Care Time devoted to patient care services described in this note is  40  minutes.   Kara Mead MD. Shade Flood. [facility] Pager [phone] If no response call [phone]     08/11/2013 9:08 AM

## 2013-08-11 NOTE — Progress Notes (Signed)
Admit: 08/08/2013 LOS: 3  68F with OHS, pulm/liver sarcoid, morbid obesity, admit with CHF exacerbation, hypercapneic RF, AKI.  Had surgical airway 08/10/13   Subjective:  NS @ 73h Emergent airway o/n No c/o this AM Good UOP K improved to 5.6 SCR improved  Last ARB 4/11 in AM Last lasix 4/12 in AM  04/12 0701 - 04/13 0700 In: 2757.5 [P.O.:120; I.V.:2547.5; NG/GT:90] Out: 2770 [Urine:2670; Blood:100]  Filed Weights   08/10/13 0500 08/11/13 0000 08/11/13 0500  Weight: 115.6 kg (254 lb 13.6 oz) 117 kg (257 lb 15 oz) 117 kg (257 lb 15 oz)    Current meds: reviewed  Current Labs: reviewed    Physical Exam:  Blood pressure 148/88, pulse 88, temperature 98.8 F (37.1 C), temperature source Oral, resp. rate 16, height 5\' 1"  (1.549 m), weight 117 kg (257 lb 15 oz), last menstrual period 08/08/2013, SpO2 100.00%. Awake, on vent. NAD Morbidly obese RRR Coarse bs b/l No LEE No rashes/lesions  Assessment 1. AKI: likely related to hypovolemia/diuretic use + ARB 2. Hyperkalemia: improving 3. hypercapneic RF s/p tracheostomy 4. Sarcoidosis (Liver/Lung) 5. Proteinuria 6. Morbid obesity 7. DM with hx/o retinopathy  Plan 1. Would cont gentle hydration esp since NPO 2. K should cont to trend down, monitor 3. CCM for resp issues 4. Will follow along, Renal Panel in AM  Pearson Grippe MD 08/11/2013, 10:21 AM   Recent Labs Lab 08/10/13 1255 08/10/13 2130 08/11/13 0230  NA 140 143 141  K 6.7* 6.0* 5.6*  CL 95* 97 96  CO2 31 35* 33*  GLUCOSE 174* 179* 181*  BUN 57* 56* 53*  CREATININE 3.36* 3.28* 2.68*  CALCIUM 8.6 8.9 9.3    Recent Labs Lab 08/08/13 1321 08/09/13 0343 08/10/13 0312  WBC 8.6 7.6 7.0  HGB 7.5* 7.2* 7.6*  HCT 26.7* 26.6* 28.1*  MCV 84.5 85.8 89.5  PLT 182 170 150

## 2013-08-11 NOTE — Progress Notes (Signed)
INITIAL NUTRITION ASSESSMENT  DOCUMENTATION CODES Per approved criteria  -Morbid Obesity   INTERVENTION:  If unable to transition to trach collar today, recommend initiate TF via NGT with Vital High Protein at 25 ml/h, increase by 10 ml every 4 hours to goal rate of 55 ml/h to provide 1320 kcals (68% of estimated nutrition needs), 116 gm protein, 1104 ml free water daily.  NUTRITION DIAGNOSIS: Inadequate oral intake related to inability to eat as evidenced by NPO status.   Goal: Enteral nutrition to provide 60-70% of estimated calorie needs (22-25 kcals/kg ideal body weight) and 100% of estimated protein needs, based on ASPEN guidelines for permissive underfeeding in critically ill obese individuals  Monitor:  TF initiation/tolerance/adequacy, weight trend, labs, vent status.  Reason for Assessment: VDRF  43 y.o. female  Admitting Dx: Acute on chronic respiratory failure  ASSESSMENT: [name] with hx sarcoid (dx 2001 -followed by dr Melvyn Novas) on home O2, NICM, pulm HTN, OSA, CKD admitted 4/10 with worsening SOB thought r/t volume overload. Diuresed with some improvement and was ready for tx to floor. However 4/12 declined with worsening renal function, hyperkalemia, hypercarbia and placed on bipap.   Required emergency tracheostomy on 4/12. Per discussion with RN, hopeful for transition to trach collar later today. Nutrition focused physical exam completed.  No muscle or subcutaneous fat depletion noticed.  Patient is currently intubated on ventilator support MV: 8.4 L/min Temp (24hrs), Avg:98.5 F (36.9 C), Min:98.2 F (36.8 C), Max:98.8 F (37.1 C)   Height: Ht Readings from Last 1 Encounters:  08/08/13 5\' 1"  (1.549 m)    Weight: Wt Readings from Last 1 Encounters:  08/11/13 257 lb 15 oz (117 kg)    Ideal Body Weight: 47.7 kg  % Ideal Body Weight: 245%  Wt Readings from Last 10 Encounters:  08/11/13 257 lb 15 oz (117 kg)   08/11/13 257 lb 15 oz (117 kg)  08/04/13 254 lb 3.2 oz (115.304 kg)  07/31/13 252 lb (114.306 kg)  07/25/13 256 lb (116.121 kg)  07/24/13 247 lb 6.4 oz (112.22 kg)  07/20/13 239 lb 4.8 oz (108.546 kg)  07/04/13 235 lb 14.4 oz (107.004 kg)  05/20/13 243 lb 4.8 oz (110.36 kg)  04/08/13 239 lb (108.41 kg)    Usual Body Weight: 243 lb (3 months ago)  % Usual Body Weight: 106%  BMI:  Body mass index is 48.76 kg/(m^2).  Estimated Nutritional Needs: Kcal: 1935 Protein: 95-119 gm Fluid: 2 L  Skin: stage 2 wound on coccyx  Diet Order: NPO  EDUCATION NEEDS: -Education not appropriate at this time   Intake/Output Summary (Last 24 hours) at 08/11/13 1308 Last data filed at 08/11/13 1200  Gross per 24 hour  Intake   3045 ml  Output   3245 ml  Net   -200 ml    Last BM: 4/12   Labs:   Recent Labs Lab 08/10/13 1255 08/10/13 2130 08/11/13 0230  NA 140 143 141  K 6.7* 6.0* 5.6*  CL 95* 97 96  CO2 31 35* 33*  BUN 57* 56* 53*  CREATININE 3.36* 3.28* 2.68*  CALCIUM 8.6 8.9 9.3  GLUCOSE 174* 179* 181*    CBG (last 3)   Recent Labs  08/11/13 0317 08/11/13 0722 08/11/13 1147  GLUCAP 178* 178* 146*    Scheduled Meds: . antiseptic oral rinse  1 application Mouth Rinse QID  . aspirin EC  81 mg Oral Daily  . chlorhexidine  15  mL Mouth/Throat BID  . heparin  5,000 Units Subcutaneous 3 times per day  . insulin aspart  0-9 Units Subcutaneous 6 times per day  . insulin glargine  5 Units Subcutaneous QHS  . methylPREDNISolone (SOLU-MEDROL) injection  80 mg Intravenous Q12H  . pantoprazole (PROTONIX) IV  40 mg Intravenous Q24H    Continuous Infusions: . sodium chloride 75 mL/hr at 08/10/13 1150    Past Medical History  Diagnosis Date  . Sarcoidosis     Followed by Dr. Melvyn Novas; w/ liver involvement per biopsy 12/09, Reversible airway component so started on [facility] 01/2010; HFA 75% p coaching 05/2010  . Hypoxemia     CT angiogram 9/11>> No PE; PFTs 10/11- FEV1 1.20  (49%) with 16% better p B2, DLCO 33%> corrects to 84; O2 sats ok on 4 lpm X rapid walk X 3 laps 05/2010  . Morbid obesity     Target wt= 153 for BMI <30  . QT prolongation   . Diabetes mellitus   . Hypertension   . Hx of cardiac cath 2/08    No CAD, no RAS,  normal EF  . Seborrheic dermatitis of scalp   . 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 Hep B serology neg. but Hep C ab +, HIV neg. 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 Cardiomyopathy, nonischemic     EF 45% 12/10; Echo 7/11 normal EF, PAS 48  . Diabetic retinopathy     Right eye 2/11  . Health maintenance examination     Mammogram 05/2010 Negative; Last Pap smear 03/2008; Last DM eye exam 2/11> mild non-proliferative diabetic retinopathy. OD  . Helicobacter pylori ab+ 05/2011    Pt was symptomatic and treatment planned for 05/2011  . CHF (congestive heart failure)     Past Surgical History  Procedure Laterality Date  . Tubal ligation    . Breast surgery    . Cesarean section    . Tracheostomy tube placement N/A 08/10/2013    Procedure: TRACHEOSTOMY;  Surgeon: Melissa Montane, MD;  Location: [place];  Service: ENT;  Laterality: N/A;    Molli Barrows, [place], [place], [place] Pager [phone] After Hours Pager [phone]

## 2013-08-11 NOTE — Progress Notes (Signed)
200 mls Fentanyl wasted after drip discontinued . Witnessed by puja patel

## 2013-08-11 NOTE — Progress Notes (Signed)
Mental status worsened , Rpt ABg reviewed - decision made to intubate Rpt labs showed hyperkalemia - treated with calcium, bicarb, unable to give kayexalate, cr slight improving Note separate procedure note about intubation events leading to tracheostomy placement in OR. Co-ordinated intubation procedure with anesthesia, trauma & ENT surgery Add cctime independent of procedure 29mins  Rigoberto Noel MD

## 2013-08-11 NOTE — Progress Notes (Signed)
Called to ICU bedside for intubation in setting of renal and respiratory failure. Patient with sarcoidosis and failure of BiPAP trial (ABG 7.18/98/116). Upon arrival patient was sedated and attempts to intubate were ongoing. Mask ventilation was established with oral airway and two hand ventilation. Fresh heme was noted in the oral area and suctioned throughout the procedure. Attempt with Glidescope 3 yielded no view of the airway. Repositioning the patient DLx1 with MAC 4 resulted in a grade 4 view. Glidescope 4 was used with visualization of the epiglottis and arytenoids. Attempts to pass a styletted ETT and bougie were unsuccessful, given the excess in redundant oropharyngeal tissue and small mouth opening. The Critical care physician attempted to pass a bronch through the cords with the Glidescope in place, but was unsuccessful. Mask ventilation was maintained throughout, however this became increasingly difficult with each intubation attempt. Trauma and ENT were contacted for a surgical airway and a LMA 4 was placed with adequate ventilation and oxygenation.   Laurie Panda, MD 08/10/2013 6:47 PM

## 2013-08-11 NOTE — Progress Notes (Signed)
No ongoing issues from our standpoint.  Will signoff.  May need ENT follow up if there is concern for cord dysfunction.  Kathryne Eriksson. Dahlia Bailiff, MD, [place] [phone] Trauma Surgeon

## 2013-08-11 NOTE — Progress Notes (Signed)
Pt with bloody mucous from nose.Dr Elsworth Soho informed

## 2013-08-11 NOTE — Progress Notes (Signed)
PT Cancellation Note  Patient Details Name: MEAGON DEGOLIER MRN: [id number] DOB: 1970-10-05   Cancelled Treatment:    Reason Eval Not Completed: Patient not medically ready. Noted emergent tracheostomy and ventilated since PT order. Per RN, they are working on weaning to trach collar today and may be appropriate for therapy tomorrow.   Jeanie Cooks Maryum Batterson 08/11/2013, 11:01 AM Pager [phone]

## 2013-08-12 ENCOUNTER — Inpatient Hospital Stay (HOSPITAL_COMMUNITY): Payer: Medicare Other

## 2013-08-12 DIAGNOSIS — R579 Shock, unspecified: Secondary | ICD-10-CM | POA: Diagnosis not present

## 2013-08-12 DIAGNOSIS — E875 Hyperkalemia: Secondary | ICD-10-CM | POA: Diagnosis not present

## 2013-08-12 DIAGNOSIS — Z930 Tracheostomy status: Secondary | ICD-10-CM

## 2013-08-12 DIAGNOSIS — J9620 Acute and chronic respiratory failure, unspecified whether with hypoxia or hypercapnia: Secondary | ICD-10-CM | POA: Diagnosis not present

## 2013-08-12 DIAGNOSIS — D869 Sarcoidosis, unspecified: Secondary | ICD-10-CM | POA: Diagnosis not present

## 2013-08-12 DIAGNOSIS — J9819 Other pulmonary collapse: Secondary | ICD-10-CM | POA: Diagnosis not present

## 2013-08-12 DIAGNOSIS — N179 Acute kidney failure, unspecified: Secondary | ICD-10-CM | POA: Diagnosis not present

## 2013-08-12 DIAGNOSIS — J9600 Acute respiratory failure, unspecified whether with hypoxia or hypercapnia: Secondary | ICD-10-CM | POA: Diagnosis not present

## 2013-08-12 LAB — GLUCOSE, CAPILLARY
Glucose-Capillary: 133 mg/dL — ABNORMAL HIGH (ref 70–99)
Glucose-Capillary: 140 mg/dL — ABNORMAL HIGH (ref 70–99)
Glucose-Capillary: 145 mg/dL — ABNORMAL HIGH (ref 70–99)
Glucose-Capillary: 150 mg/dL — ABNORMAL HIGH (ref 70–99)
Glucose-Capillary: 162 mg/dL — ABNORMAL HIGH (ref 70–99)
Glucose-Capillary: 166 mg/dL — ABNORMAL HIGH (ref 70–99)
Glucose-Capillary: 174 mg/dL — ABNORMAL HIGH (ref 70–99)

## 2013-08-12 LAB — BASIC METABOLIC PANEL
BUN: 38 mg/dL — ABNORMAL HIGH (ref 6–23)
CO2: 33 mEq/L — ABNORMAL HIGH (ref 19–32)
Calcium: 9.5 mg/dL (ref 8.4–10.5)
Chloride: 103 mEq/L (ref 96–112)
Creatinine, Ser: 1.63 mg/dL — ABNORMAL HIGH (ref 0.50–1.10)
GFR calc Af Amer: 43 mL/min — ABNORMAL LOW (ref >90)
GFR calc non Af Amer: 37 mL/min — ABNORMAL LOW (ref >90)
Glucose, Bld: 155 mg/dL — ABNORMAL HIGH (ref 70–99)
Potassium: 5.6 mEq/L — ABNORMAL HIGH (ref 3.7–5.3)
Sodium: 148 mEq/L — ABNORMAL HIGH (ref 137–147)

## 2013-08-12 LAB — PROCALCITONIN: Procalcitonin: <0.1 ng/mL

## 2013-08-12 LAB — CBC
HCT: 26.8 % — ABNORMAL LOW (ref 36.0–46.0)
Hemoglobin: 7.4 g/dL — ABNORMAL LOW (ref 12.0–15.0)
MCH: 24.1 pg — ABNORMAL LOW (ref 26.0–34.0)
MCHC: 27.6 g/dL — ABNORMAL LOW (ref 30.0–36.0)
MCV: 87.3 fL (ref 78.0–100.0)
Platelets: 214 10*3/uL (ref 150–400)
RBC: 3.07 MIL/uL — ABNORMAL LOW (ref 3.87–5.11)
RDW: 19.5 % — ABNORMAL HIGH (ref 11.5–15.5)
WBC: 8.8 10*3/uL (ref 4.0–10.5)

## 2013-08-12 MED ORDER — METHYLPREDNISOLONE SODIUM SUCC 40 MG IJ SOLR
40.0000 mg | Freq: Two times a day (BID) | INTRAMUSCULAR | Status: DC
  Administered 2013-08-12 (×2): 40 mg via INTRAVENOUS
  Filled 2013-08-12 (×4): qty 1

## 2013-08-12 MED ORDER — PANTOPRAZOLE SODIUM 40 MG PO PACK
40.0000 mg | PACK | Freq: Every day | ORAL | Status: DC
  Administered 2013-08-12 – 2013-08-15 (×4): 40 mg
  Filled 2013-08-12 (×5): qty 20

## 2013-08-12 NOTE — Evaluation (Signed)
Physical Therapy Evaluation Patient Details Name: Melody Gray MRN: [id number] DOB: 14-Feb-1971 Today's Date: 08/12/2013   History of Present Illness    5F with OHS, pulm/liver sarcoid, morbid obesity, admit with CHF exacerbation, hypercapneic RF, AKI. Had surgical airway 08/10/13. BL SCr 0.9-1.1      Clinical Impression  Pt admitted with above. Pt currently with functional limitations due to the deficits listed below (see PT Problem List).  Pt will benefit from skilled PT to increase their independence and safety with mobility to allow discharge to the venue listed below.     Follow Up Recommendations Home health PT;Other (comment) (Pulmonary Rehab)    Equipment Recommendations  Other (comment) (TBA - may need cane)    Recommendations for Other Services       Precautions / Restrictions Precautions Precautions: Fall Restrictions Weight Bearing Restrictions: No      Mobility  Bed Mobility Overal bed mobility: Needs Assistance Bed Mobility: Sit to Supine       Sit to supine: Min guard      Transfers Overall transfer level: Needs assistance Equipment used: Rolling walker (2 wheeled) Transfers: Sit to/from Stand Sit to Stand: Min assist         General transfer comment: Pt able to power-up to full stand, taking increased time at completion of stand to steady herself before beginning ambulation.   Ambulation/Gait Ambulation/Gait assistance: Min guard Ambulation Distance (Feet): 225 Feet Assistive device: Rolling walker (2 wheeled) Gait Pattern/deviations: Step-through pattern;Decreased stride length;Wide base of support Gait velocity: Decreased Gait velocity interpretation: Below normal speed for age/gender General Gait Details: Overall steady gait with RW.  No LOB noted as well.  Occasional asssist and cues to steer RW.    Stairs            Wheelchair Mobility    Modified Rankin (Stroke Patients Only)       Balance Overall balance assessment: No  apparent balance deficits (not formally assessed)                                           Pertinent Vitals/Pain VSS, No pain    Home Living Family/patient expects to be discharged to:: Private residence Living Arrangements: Children Available Help at Discharge: Family;Available 24 hours/day (boyfriend and family) Type of Home: House Home Access: Stairs to enter Entrance Stairs-Rails: Left Entrance Stairs-Number of Steps: 5 Home Layout: One level Home Equipment: Crutches;Cane - single point;Other (comment) (Oxygen ) Additional Comments: 2L/min supplemental O2 at home    Prior Function Level of Independence: Independent         Comments: Not working - on disability. Still driving. When gout flares up in L ankle use cane.     Hand Dominance   Dominant Hand: Right    Extremity/Trunk Assessment   Upper Extremity Assessment: Defer to OT evaluation           Lower Extremity Assessment: Overall WFL for tasks assessed      Cervical / Trunk Assessment: Lordotic  Communication   Communication: No difficulties  Cognition Arousal/Alertness: Awake/alert Behavior During Therapy: WFL for tasks assessed/performed Overall Cognitive Status: Within Functional Limits for tasks assessed                      General Comments      Exercises General Exercises - Lower Extremity Ankle Circles/Pumps: AROM;5 reps;Both;Seated Heel Slides:  AROM;Both;5 reps;Supine Hip ABduction/ADduction: AROM;Both;10 reps;Supine      Assessment/Plan    PT Assessment Patient needs continued PT services  PT Diagnosis Generalized weakness   PT Problem List Decreased strength;Decreased range of motion;Decreased activity tolerance;Decreased balance;Decreased mobility;Decreased knowledge of use of DME;Decreased safety awareness;Pain;Cardiopulmonary status limiting activity  PT Treatment Interventions DME instruction;Gait training;Functional mobility training;Stair  training;Therapeutic activities;Therapeutic exercise;Neuromuscular re-education;Patient/family education   PT Goals (Current goals can be found in the Care Plan section) Acute Rehab PT Goals Patient Stated Goal: To be able to leave the house  PT Goal Formulation: With patient Time For Goal Achievement: 08/19/13 Potential to Achieve Goals: Good    Frequency Min 3X/week   Barriers to discharge        Co-evaluation               End of Session Equipment Utilized During Treatment: Gait belt;Oxygen (trach at 35% trach collar) Activity Tolerance: Patient tolerated treatment well Patient left: in bed;with call bell/phone within reach Nurse Communication: Mobility status         Time: [id number] PT Time Calculation (min): 32 min   Charges:   PT Evaluation $Initial PT Evaluation Tier I: 1 Procedure PT Treatments $Gait Training: 8-22 mins $Therapeutic Exercise: 8-22 mins   PT G Codes:          Christianne Dolin August 15, 2013, 11:53 AM [facility] [phone] (pager)

## 2013-08-12 NOTE — Progress Notes (Signed)
PULMONARY / CRITICAL CARE MEDICINE   Name: Melody Gray MRN: [id number] DOB: 12-25-1970    ADMISSION DATE:  08/08/2013 CONSULTATION DATE:  08/10/13  REFERRING MD :  Marinda Elk ([facility]) PRIMARY SERVICE: [facility]  CHIEF COMPLAINT:  resp failure   BRIEF PATIENT DESCRIPTION: 43yo morbidly obese female active smoker with hx sarcoid (dx 2001 -followed by dr Melvyn Novas) on home O2, NICM, pulm HTN, OSA, CKD admitted 4/10 by [facility] with worsening SOB thought r/t volume overload.  Diuresed with some improvement and was ready for tx to floor.  However 4/12 declined with worsening renal function, hyperkalemia, hypercarbia and placed on bipap and PCCM consulted.   SIGNIFICANT EVENTS / STUDIES:  2D echo 4/12>>>nml LV fn, RVSP 48   LINES / TUBES: 4/12 Emergent Tstomy (Trauma/ Janace Hoard - difficult airway) >>   CULTURES: none   ANTIBIOTICS: none  Subj - denies pain Afebrile oob to chair  VITAL SIGNS: Temp:  [97.5 F (36.4 C)-98.7 F (37.1 C)] 97.8 F (36.6 C) (04/14 0717) Pulse Rate:  [66-99] 81 (04/14 0800) Resp:  [0-22] 12 (04/14 0800) BP: (128-162)/(70-92) 133/75 mmHg (04/14 0800) SpO2:  [93 %-100 %] 100 % (04/14 0800) FiO2 (%):  [40 %] 40 % (04/14 0800) Weight:  [113.853 kg (251 lb)] 113.853 kg (251 lb) (04/14 0500) HEMODYNAMICS:   VENTILATOR SETTINGS: Vent Mode:  [-] CPAP;PSV FiO2 (%):  [40 %] 40 % Set Rate:  [14 bmp] 14 bmp Vt Set:  [500 mL] 500 mL PEEP:  [5 cmH20] 5 cmH20 Pressure Support:  [5 cmH20] 5 cmH20 Plateau Pressure:  [27 cmH20] 27 cmH20 INTAKE / OUTPUT: Intake/Output     04/13 0701 - 04/14 0700 04/14 0701 - 04/15 0700   P.O.     I.V. (mL/kg) 1802.5 (15.8) 75 (0.7)   NG/GT 60    Total Intake(mL/kg) 1862.5 (16.4) 75 (0.7)   Urine (mL/kg/hr) 1850 (0.7) 50 (0.2)   Emesis/NG output 200 (0.1)    Stool 1 (0)    Blood     Total Output 2051 50   Net -188.5 +25          PHYSICAL EXAMINATION: General:  Morbidly obese, chronically ill appearing female, NAD on ATC Neuro:  alert, interactive, mouthing words,, MAE, follows commands  HEENT:  Mm dry no JVD, bleeding at tstomy site Cardiovascular:  s1s2 rrr Lungs:  resps even non labored , diminished throughout  Abdomen:  Obese, soft, mildly distended, +bs Musculoskeletal:  Warm and dry, 1+ BLE edema   LABS:  CBC  Recent Labs Lab 08/09/13 0343 08/10/13 0312 08/12/13 0309  WBC 7.6 7.0 8.8  HGB 7.2* 7.6* 7.4*  HCT 26.6* 28.1* 26.8*  PLT 170 150 214   Coag's No results found for this basename: APTT, INR,  in the last 168 hours BMET  Recent Labs Lab 08/11/13 0230 08/11/13 1247 08/12/13 0309  NA 141 146 148*  K 5.6* 5.1 5.6*  CL 96 100 103  CO2 33* 36* 33*  BUN 53* 45* 38*  CREATININE 2.68* 2.07* 1.63*  GLUCOSE 181* 151* 155*   Electrolytes  Recent Labs Lab 08/11/13 0230 08/11/13 1247 08/12/13 0309  CALCIUM 9.3 9.3 9.5   Sepsis Markers  Recent Labs Lab 08/10/13 1255 08/11/13 0230 08/12/13 0309  LATICACIDVEN 1.0  --   --   PROCALCITON 0.15 0.12 <0.10   ABG  Recent Labs Lab 08/10/13 0918 08/10/13 1200 08/10/13 2051  PHART 7.176* 7.183* 7.336*  PCO2ART 95.9* 98.8* 70.9*  PO2ART 74.3* 116.0* 103.0*  Liver Enzymes No results found for this basename: AST, ALT, ALKPHOS, BILITOT, ALBUMIN,  in the last 168 hours Cardiac Enzymes  Recent Labs Lab 08/08/13 1402 08/08/13 2314 08/09/13 0348 08/09/13 0915  TROPONINI  --  <0.30 <0.30 <0.30  PROBNP 5862.0*  --   --   --    Glucose  Recent Labs Lab 08/11/13 0722 08/11/13 1147 08/11/13 1628 08/11/13 2047 08/12/13 0008 08/12/13 0714  GLUCAP 178* 146* 159* 162* 145* 166*    Imaging US Renal Port  08/10/2013   CLINICAL DATA:  Renal failure, history of morbid obesity, sarcoidosis, diabetes and hypertension  EXAM: RENAL/URINARY TRACT ULTRASOUND COMPLETE  COMPARISON:  CT ABD W/CM dated 08/08/2008  FINDINGS: The examination is degraded due to patient body habitus and poor sonographic window.  Right Kidney:  Normal cortical  thickness, echogenicity and size, measuring 12.9 cm in length. No focal renal lesions. No echogenic renal stones. No urinary obstruction.  Left Kidney:  Normal cortical thickness, echogenicity and size, measuring 12.0 cm in length. No focal renal lesions. No echogenic renal stones. No urinary obstruction.  Bladder:  Appears normal for degree of bladder distention.  The spleen remains enlarged measuring approximately 13.1 x 0.9 x 6.8 cm (volume - 651 mL).  IMPRESSION: 1. No explanation for patient's renal failure. Specifically, no evidence of urinary obstruction. 2. Splenomegaly, similar to remote prior abdominal CT.   Electronically Signed   By: Sandi Mariscal M.D.   On: 08/10/2013 12:31   Dg Chest Port 1 View  08/12/2013   CLINICAL DATA:  Check tracheostomy placement  EXAM: PORTABLE CHEST - 1 VIEW  COMPARISON:  08/11/2013  FINDINGS: Tracheostomy tube and nasogastric catheter are again seen and stable. The cardiac shadow remains enlarged. Left basilar infiltrate is seen. Some mild right basilar atelectasis is noted. No new focal abnormality is seen. Mild vascular congestion remains.  IMPRESSION: No change from the prior exam.   Electronically Signed   By: Inez Catalina M.D.   On: 08/12/2013 07:40   Dg Chest Portable 1 View  08/11/2013   CLINICAL DATA:  Respiratory failure.  EXAM: PORTABLE CHEST - 1 VIEW  COMPARISON:  08/10/2013  FINDINGS: The heart is enlarged but stable. Persistent vascular congestion and probable pulmonary edema. The tracheostomy tube is stable. The NG tube is stable. Bibasilar atelectasis persists. No large pleural effusion.  IMPRESSION: Persistent vascular congestion and probable pulmonary edema along the bibasilar atelectasis.   Electronically Signed   By: Kalman Jewels M.D.   On: 08/11/2013 07:38   Dg Chest Port 1 View  08/10/2013   CLINICAL DATA:  Post tracheostomy, NG tube placement  EXAM: PORTABLE CHEST - 1 VIEW  COMPARISON:  DG CHEST 2 VIEW dated 08/09/2013; DG CHEST 2 VIEW dated  08/08/2013; CT ANGIO CHEST W/CM &/OR WO/CM dated 07/25/2013  FINDINGS: Moderately severe cardiac enlargement. Tracheostomy tube projects over the tracheal air column. Mild bibasilar opacities likely representing atelectasis. NG tube is identified in extends off the inferior edge of the film, below the level of the diaphragm.  IMPRESSION: Lines and tubes as described. Bibasilar interstitial opacities not significantly different from prior study allowing for change in projection.   Electronically Signed   By: Skipper Cliche M.D.   On: 08/10/2013 20:46   pCXR - edema pattern   ASSESSMENT / PLAN: Principal Problem:   Acute on chronic respiratory failure Active Problems:   Sarcoidosis   Diabetes mellitus type II, controlled   OBESITY, MORBID   Anemia in chronic kidney disease(285.21)  NICOTINE ADDICTION   Respiratory failure, chronic   NEPHROTIC SYNDROME   LONG QT SYNDROME   Pulmonary hypertension associated with sarcoidosis   Cardiomyopathy   Volume overload   Acute renal failure   Hyperkalemia   PULMONARY Acute on chronic hypercarbic respiratory failure  Sarcoid  OSA/OHS - Tstomy emergent 4/12  Tobacco use Long standing non adherence to medical advice , see clinic records P:   Decrease solumedrol 40 q 12 - pred in 24h Smoking cessation   ATC as tolerated daytime, PS/CPAP at night Unfortunately expect some vocal cord damage here - long term effects on speech unclear. Speech/swallow eval   CARDIOVASCULAR Acute on chronic dCHF  Hypotension  P:   Hold anti-htn , d/c ARB Replace volume gently   RENAL Acute on chronic renal failure  (baseline Scr ~0.9) -related to ARB/diuretics , improving  Renal ultrasound no obs Hyperkalemia -improved  P:   Renal following Ct IVFs   GASTROINTESTINAL A:  No active issue  P:   Start TFs  HEMATOLOGIC Anemia  P:  F/u cbc   INFECTIOUS No s/s active infection , low pct reassuring P:   Monitor fever, wbc off abx    ENDOCRINE DM  P:   Lantus  ssi   NEUROLOGIC AMS - lethargy in setting hypercarbia - improved  P:   Supportive care  PT consult  Summary - renal failure resolving, daytime trach collar, remains to be seen if she will require nocturnal vent for OHS. Unfortunately expect some vocal cord damage here - long term effects on speech unclear.   I have personally obtained a history, examined the patient, evaluated laboratory and imaging results, formulated the assessment and plan and placed orders. CRITICAL CARE: The patient is critically ill with multiple organ systems failure and requires high complexity decision making for assessment and support, frequent evaluation and titration of therapies, application of advanced monitoring technologies and extensive interpretation of multiple databases. Critical Care Time devoted to patient care services described in this note is  32  minutes.   Kara Mead MD. [place]. [facility] Pager [phone] If no response call [phone]     08/12/2013 9:33 AM

## 2013-08-12 NOTE — Progress Notes (Signed)
Admit: 08/08/2013 LOS: 4  70F with OHS, pulm/liver sarcoid, morbid obesity, admit with CHF exacerbation, hypercapneic RF, AKI.  Had surgical airway 08/10/13. BL SCr 0.9-1.1  Subjective:  Tolerating TC Good UOP Improved SCr Pt w/o c/o AFVSS  04/13 0701 - 04/14 0700 In: 1862.5 [I.V.:1802.5; NG/GT:60] Out: 2051 [Urine:1850; Emesis/NG output:200; Stool:1]  Filed Weights   08/11/13 0000 08/11/13 0500 08/12/13 0500  Weight: 117 kg (257 lb 15 oz) 117 kg (257 lb 15 oz) 113.853 kg (251 lb)    Current meds: reviewed  Current Labs: reviewed    Physical Exam:  Blood pressure 131/78, pulse 75, temperature 97.8 F (36.6 C), temperature source Oral, resp. rate 10, height 5\' 1"  (1.549 m), weight 113.853 kg (251 lb), last menstrual period 08/08/2013, SpO2 96.00%. Awake, on TC. NAD Morbidly obese RRR Coarse bs b/l No LEE No rashes/lesions  Assessment 1. AKI: likely related to hypovolemia/diuretic use + ARB 2. Hyperkalemia: improving 3. hypercapneic RF s/p tracheostomy 4. Sarcoidosis (Liver/Lung) 5. Proteinuria 6. Morbid obesity 7. DM with hx/o retinopathy  Plan 1. Would cont gentle hydration esp since NPO 2. K is stable 3. CCM for resp issues 4. Will follow along, Renal Panel in AM  Pearson Grippe MD 08/12/2013, 11:31 AM   Recent Labs Lab 08/11/13 0230 08/11/13 1247 08/12/13 0309  NA 141 146 148*  K 5.6* 5.1 5.6*  CL 96 100 103  CO2 33* 36* 33*  GLUCOSE 181* 151* 155*  BUN 53* 45* 38*  CREATININE 2.68* 2.07* 1.63*  CALCIUM 9.3 9.3 9.5    Recent Labs Lab 08/09/13 0343 08/10/13 0312 08/12/13 0309  WBC 7.6 7.0 8.8  HGB 7.2* 7.6* 7.4*  HCT 26.6* 28.1* 26.8*  MCV 85.8 89.5 87.3  PLT 170 150 214

## 2013-08-13 DIAGNOSIS — E875 Hyperkalemia: Secondary | ICD-10-CM | POA: Diagnosis not present

## 2013-08-13 DIAGNOSIS — N179 Acute kidney failure, unspecified: Secondary | ICD-10-CM | POA: Diagnosis not present

## 2013-08-13 DIAGNOSIS — J9620 Acute and chronic respiratory failure, unspecified whether with hypoxia or hypercapnia: Secondary | ICD-10-CM | POA: Diagnosis not present

## 2013-08-13 DIAGNOSIS — Z930 Tracheostomy status: Secondary | ICD-10-CM | POA: Diagnosis not present

## 2013-08-13 DIAGNOSIS — R579 Shock, unspecified: Secondary | ICD-10-CM | POA: Diagnosis not present

## 2013-08-13 DIAGNOSIS — J9600 Acute respiratory failure, unspecified whether with hypoxia or hypercapnia: Secondary | ICD-10-CM | POA: Diagnosis not present

## 2013-08-13 LAB — BASIC METABOLIC PANEL
BUN: 32 mg/dL — ABNORMAL HIGH (ref 6–23)
CO2: 33 mEq/L — ABNORMAL HIGH (ref 19–32)
Calcium: 10.1 mg/dL (ref 8.4–10.5)
Chloride: 103 mEq/L (ref 96–112)
Creatinine, Ser: 1.27 mg/dL — ABNORMAL HIGH (ref 0.50–1.10)
GFR calc Af Amer: 59 mL/min — ABNORMAL LOW (ref >90)
GFR calc non Af Amer: 51 mL/min — ABNORMAL LOW (ref >90)
Glucose, Bld: 142 mg/dL — ABNORMAL HIGH (ref 70–99)
Potassium: 5.5 mEq/L — ABNORMAL HIGH (ref 3.7–5.3)
Sodium: 148 mEq/L — ABNORMAL HIGH (ref 137–147)

## 2013-08-13 LAB — GLUCOSE, CAPILLARY
Glucose-Capillary: 157 mg/dL — ABNORMAL HIGH (ref 70–99)
Glucose-Capillary: 128 mg/dL — ABNORMAL HIGH (ref 70–99)
Glucose-Capillary: 138 mg/dL — ABNORMAL HIGH (ref 70–99)
Glucose-Capillary: 155 mg/dL — ABNORMAL HIGH (ref 70–99)
Glucose-Capillary: 140 mg/dL — ABNORMAL HIGH (ref 70–99)

## 2013-08-13 LAB — CBC
HCT: 29 % — ABNORMAL LOW (ref 36.0–46.0)
Hemoglobin: 7.8 g/dL — ABNORMAL LOW (ref 12.0–15.0)
MCH: 23.9 pg — ABNORMAL LOW (ref 26.0–34.0)
MCHC: 26.9 g/dL — ABNORMAL LOW (ref 30.0–36.0)
MCV: 89 fL (ref 78.0–100.0)
Platelets: 219 10*3/uL (ref 150–400)
RBC: 3.26 MIL/uL — ABNORMAL LOW (ref 3.87–5.11)
RDW: 19.4 % — ABNORMAL HIGH (ref 11.5–15.5)
WBC: 5.9 10*3/uL (ref 4.0–10.5)

## 2013-08-13 MED ORDER — CARVEDILOL 25 MG PO TABS
25.0000 mg | ORAL_TABLET | Freq: Two times a day (BID) | ORAL | Status: DC
  Administered 2013-08-13 – 2013-08-15 (×5): 25 mg via ORAL
  Filled 2013-08-13 (×7): qty 1

## 2013-08-13 MED ORDER — SODIUM CHLORIDE 0.45 % IV SOLN
INTRAVENOUS | Status: DC
  Administered 2013-08-13: 50 mL/h via INTRAVENOUS
  Administered 2013-08-14: 06:00:00 via INTRAVENOUS

## 2013-08-13 MED ORDER — METHYLPREDNISOLONE SODIUM SUCC 40 MG IJ SOLR
40.0000 mg | Freq: Every day | INTRAMUSCULAR | Status: DC
  Administered 2013-08-13 – 2013-08-14 (×2): 40 mg via INTRAVENOUS
  Filled 2013-08-13 (×2): qty 1

## 2013-08-13 MED ORDER — FENTANYL CITRATE 0.05 MG/ML IJ SOLN
25.0000 ug | INTRAMUSCULAR | Status: DC | PRN
  Administered 2013-08-13: 50 ug via INTRAVENOUS
  Administered 2013-08-13: 25 ug via INTRAVENOUS
  Administered 2013-08-13 – 2013-08-15 (×9): 50 ug via INTRAVENOUS
  Filled 2013-08-13 (×11): qty 2

## 2013-08-13 MED ORDER — ACETAMINOPHEN 325 MG PO TABS: 650.0000 mg | ORAL_TABLET | ORAL | Status: DC | PRN

## 2013-08-13 MED ORDER — HYDRALAZINE HCL 20 MG/ML IJ SOLN: 10.0000 mg | INTRAMUSCULAR | Status: DC | PRN

## 2013-08-13 NOTE — Progress Notes (Signed)
Patient unable to tolerate CPAP/PS settings--due to inadequate mve and spontaneous rate. Placed pt on current standing ventilator orders. RT will continue to monitor

## 2013-08-13 NOTE — Progress Notes (Signed)
Patient has been stable on ATC 40%--patient anxious is now requesting to go on the ventilator to help facilitate sleep. Placed patient on CPAP/PS. Currently tolerating well. RT will continue to monitor.

## 2013-08-13 NOTE — Progress Notes (Signed)
Pt received from [facility] 16 per W/C alert/oriented - oriented to room and nurse call system. Settled in bed CHG bath done

## 2013-08-13 NOTE — Progress Notes (Signed)
PULMONARY / CRITICAL CARE MEDICINE   Name: Melody Gray MRN: [id number] DOB: 02/07/1971    ADMISSION DATE:  08/08/2013 CONSULTATION DATE:  08/10/13  REFERRING MD :  Marinda Elk ([facility]) PRIMARY SERVICE: IMTS  CHIEF COMPLAINT:  resp failure   BRIEF PATIENT DESCRIPTION: 43yo morbidly obese female active smoker with hx sarcoid (dx 2001 -followed by dr Melvyn Novas) on home O2, NICM, pulm HTN, OSA, CKD admitted 4/10 by IMTS with worsening SOB thought r/t volume overload.  Diuresed with some improvement and was ready for tx to floor.  However 4/12 declined with worsening renal function, hyperkalemia, hypercarbia and placed on bipap and PCCM consulted.   SIGNIFICANT EVENTS / STUDIES:  2D echo 4/12>>>nml LV fn, RVSP 48   LINES / TUBES: 4/12 Emergent Tstomy (Trauma/ Janace Hoard - difficult airway) >>   CULTURES: none   ANTIBIOTICS: none  Subj - denies pain Afebrile oob to chair Good UO  VITAL SIGNS: Temp:  [97.7 F (36.5 C)-98.4 F (36.9 C)] 98.2 F (36.8 C) (04/15 0711) Pulse Rate:  [51-81] 77 (04/15 0600) Resp:  [10-24] 14 (04/15 0600) BP: (131-178)/(69-94) 177/92 mmHg (04/15 0600) SpO2:  [91 %-100 %] 96 % (04/15 0600) FiO2 (%):  [28 %-40 %] 40 % (04/15 0400) Weight:  [113.5 kg (250 lb 3.6 oz)] 113.5 kg (250 lb 3.6 oz) (04/15 0500) HEMODYNAMICS:   VENTILATOR SETTINGS: Vent Mode:  [-] PRVC FiO2 (%):  [28 %-40 %] 40 % Set Rate:  [14 bmp] 14 bmp Vt Set:  [400 mL] 400 mL PEEP:  [5 cmH20] 5 cmH20 Plateau Pressure:  [17 cmH20-24 cmH20] 24 cmH20 INTAKE / OUTPUT: Intake/Output     04/14 0701 - 04/15 0700 04/15 0701 - 04/16 0700   I.V. (mL/kg) 1200 (10.6)    NG/GT 140    Total Intake(mL/kg) 1340 (11.8)    Urine (mL/kg/hr) 1315 (0.5)    Emesis/NG output 250 (0.1)    Stool     Total Output 1565     Net -225            PHYSICAL EXAMINATION: General:  Morbidly obese, chronically ill appearing female, NAD on ATC Neuro: alert, interactive, mouthing words,, MAE, follows commands    HEENT:  Mm dry no JVD, bleeding at tstomy site Cardiovascular:  s1s2 rrr Lungs:  resps even non labored , diminished throughout  Abdomen:  Obese, soft, mildly distended, +bs Musculoskeletal:  Warm and dry, 1+ BLE edema   LABS:  CBC  Recent Labs Lab 08/10/13 0312 08/12/13 0309 08/13/13 0215  WBC 7.0 8.8 5.9  HGB 7.6* 7.4* 7.8*  HCT 28.1* 26.8* 29.0*  PLT 150 214 219   Coag's No results found for this basename: APTT, INR,  in the last 168 hours BMET  Recent Labs Lab 08/11/13 1247 08/12/13 0309 08/13/13 0215  NA 146 148* 148*  K 5.1 5.6* 5.5*  CL 100 103 103  CO2 36* 33* 33*  BUN 45* 38* 32*  CREATININE 2.07* 1.63* 1.27*  GLUCOSE 151* 155* 142*   Electrolytes  Recent Labs Lab 08/11/13 1247 08/12/13 0309 08/13/13 0215  CALCIUM 9.3 9.5 10.1   Sepsis Markers  Recent Labs Lab 08/10/13 1255 08/11/13 0230 08/12/13 0309  LATICACIDVEN 1.0  --   --   PROCALCITON 0.15 0.12 <0.10   ABG  Recent Labs Lab 08/10/13 0918 08/10/13 1200 08/10/13 2051  PHART 7.176* 7.183* 7.336*  PCO2ART 95.9* 98.8* 70.9*  PO2ART 74.3* 116.0* 103.0*   Liver Enzymes No results found for this basename:  AST, ALT, ALKPHOS, BILITOT, ALBUMIN,  in the last 168 hours Cardiac Enzymes  Recent Labs Lab 08/08/13 1402 08/08/13 2314 08/09/13 0348 08/09/13 0915  TROPONINI  --  <0.30 <0.30 <0.30  PROBNP 5862.0*  --   --   --    Glucose  Recent Labs Lab 08/12/13 0714 08/12/13 1138 08/12/13 1513 08/12/13 1939 08/12/13 2332 08/13/13 0318  GLUCAP 166* 174* 140* 150* 133* 128*    Imaging Dg Chest Port 1 View  08/12/2013   CLINICAL DATA:  Check tracheostomy placement  EXAM: PORTABLE CHEST - 1 VIEW  COMPARISON:  08/11/2013  FINDINGS: Tracheostomy tube and nasogastric catheter are again seen and stable. The cardiac shadow remains enlarged. Left basilar infiltrate is seen. Some mild right basilar atelectasis is noted. No new focal abnormality is seen. Mild vascular congestion  remains.  IMPRESSION: No change from the prior exam.   Electronically Signed   By: Inez Catalina M.D.   On: 08/12/2013 07:40   pCXR - edema pattern   ASSESSMENT / PLAN: Principal Problem:   Acute on chronic respiratory failure Active Problems:   Sarcoidosis   Diabetes mellitus type II, controlled   OBESITY, MORBID   Anemia in chronic kidney disease(285.21)   NICOTINE ADDICTION   Respiratory failure, chronic   NEPHROTIC SYNDROME   LONG QT SYNDROME   Pulmonary hypertension associated with sarcoidosis   Cardiomyopathy   Volume overload   Acute renal failure   Hyperkalemia   PULMONARY Acute on chronic hypercarbic respiratory failure  Sarcoid  OSA/OHS - Tstomy emergent 4/12  Tobacco use Long standing non adherence to medical advice , see clinic records P:   Decrease solumedrol 40 q 24 - pred in 24h Smoking cessation   ATC as tolerated daytime, PS/CPAP at night Unfortunately expect some vocal cord/ thyroid cartilage damage here per ENT - long term effects on speech unclear, expect will need trach for a long time Speech/swallow eval   CARDIOVASCULAR Acute on chronic dCHF  Hypotension  P:  Resume coreg, d/c ARB Hydralazine prn Replace volume gently   RENAL Acute on chronic renal failure  (baseline Scr ~0.9) -related to ARB/diuretics , improving  Renal ultrasound no obs Hyperkalemia -improved  P:   Renal following Ct IVFs   GASTROINTESTINAL A:  No active issue  P:   Start TFs, advance PO once cleared  HEMATOLOGIC Anemia  P:  F/u cbc   INFECTIOUS No s/s active infection , low pct reassuring P:   Monitor fever, wbc off abx   ENDOCRINE DM  P:   Lantus  ssi   NEUROLOGIC AMS - lethargy in setting hypercarbia - improved  P:   Supportive care  PT consult  Summary - renal failure resolving, daytime trach collar, remains to be seen if she will require nocturnal vent for OHS. Unfortunately expect some vocal cord damage here - long term effects on speech  unclear. Can transfer to SDU, Back to IMTS eventually  I have personally obtained a history, examined the patient, evaluated laboratory and imaging results, formulated the assessment and plan and placed orders. CRITICAL CARE: The patient is critically ill with multiple organ systems failure and requires high complexity decision making for assessment and support, frequent evaluation and titration of therapies, application of advanced monitoring technologies and extensive interpretation of multiple databases. Critical Care Time devoted to patient care services described in this note is  31  minutes.   Kara Mead MD. Shade Flood. [facility] Pager [phone] If no response  call [phone]     08/13/2013 8:04 AM

## 2013-08-13 NOTE — Evaluation (Signed)
[facility] - Evaluation Patient Details  Name: Melody Gray MRN: [id number] Date of Birth: [birth year]  Today's Date: 08/13/2013 Time: 1040-1130 SLP Time Calculation (min): 50 min  Past Medical History:  Past Medical History  Diagnosis Date  . Sarcoidosis     Followed by Dr. Melvyn Novas; w/ liver involvement per biopsy 12/09, Reversible airway component so started on [facility] 01/2010; HFA 75% p coaching 05/2010  . Hypoxemia     CT angiogram 9/11>> No PE; PFTs 10/11- FEV1 1.20 (49%) with 16% better p B2, DLCO 33%> corrects to 84; O2 sats ok on 4 lpm X rapid walk X 3 laps 05/2010  . Morbid obesity     Target wt= 153 for BMI <30  . QT prolongation   . Diabetes mellitus   . Hypertension   . Hx of cardiac cath 2/08    No CAD, no RAS,  normal EF  . Seborrheic dermatitis of scalp   . 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 Hep B serology neg. but Hep C ab +, HIV neg. 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 Cardiomyopathy, nonischemic     EF 45% 12/10; Echo 7/11 normal EF, PAS 48  . Diabetic retinopathy     Right eye 2/11  . Health maintenance examination     Mammogram 05/2010 Negative; Last Pap smear 03/2008; Last DM eye exam 2/11> mild non-proliferative diabetic retinopathy. OD  . Helicobacter pylori ab+ 05/2011    Pt was symptomatic and treatment planned for 05/2011  . CHF (congestive heart failure)    Past Surgical History:  Past Surgical History  Procedure Laterality Date  . Tubal ligation    . Breast surgery    . Cesarean section    . Tracheostomy tube placement N/A 08/10/2013    Procedure: TRACHEOSTOMY;  Surgeon: Melissa Montane, MD;  Location: [place];  Service: ENT;  Laterality: N/A;   HPI:  43 year old female admitted 08/08/13 due to increased SOB.  PMH significant for sarcoidosis, HTN, GERD, DM.  CXR 08/12/13 indicates LLL infiltrate, RLL atelectasis. Orders received for PMSV evaluation to improve  communication effectiveness and swallow safety   Assessment / Plan / Recommendation Clinical Impression  Per RN, bedside intubation was unsuccessful, and pt was taken to OR for trach placement 08/10/13.  Today, pt alert and excited about PMSV trial. Pt uses gestures and mouths words to express needs/wants.  Also able to write to increase communicative effectiveness.  Pt tolerated cuff deflation without significant change in VS or increase in anxiety.  Pt tolerated cuff deflation for less than 1 minute, and began reporting it was difficult for her to breathe.  Vital signs were stable throughout Pt reported improvement in breathing after removal.  Pt may have interference from swelling assosciated with emergent and difficult intubation, and tolerance of PMSV may improve once swelling subsides.  SLP to continue assessment of PMSV tolerance.  Will defer formal BSE until PMSV better tolerated.  RN aware. PMSV placed in holding container, cuff left deflated, and RN informed.     SLP Assessment  Patient needs continued Speech Lanaguage Pathology Services    Follow Up Recommendations  Inpatient Rehab    Frequency and Duration min 2x/week  1 week   Pertinent Vitals/Pain VSS, no pain reported    SLP Goals Potential to Achieve Goals: Good Potential Considerations: Ability to learn/carryover information;Family/community support;Cooperation/participation level;Previous level  of function   PMSV Trial  PMSV was placed for: less than 1 minute. Air trapping noted. Able to redirect subglottic air through upper airway: Yes Able to Attain Phonation: Yes Voice Quality: Low vocal intensity;Other (comment) (raspy) Able to Expectorate Secretions: No attempts Level of Secretion Expectoration with PMSV: Not observed Breath Support for Phonation: Mildly decreased Intelligibility: Intelligibility reduced Word: 75-100% accurate Phrase: 75-100% accurate Sentence: 75-100% accurate Conversation: 75-100%  accurate Respirations During Trial: 15 SpO2 During Trial: 100 % Pulse During Trial: 68 Behavior: Alert;Cooperative;Expresses self well;Good eye contact;Responsive to questions   Tracheostomy Tube   #6 Shiley, cuffed   Vent Dependency  FiO2 (%): 28 %    Cuff Deflation Trial Tolerated Cuff Deflation: Yes Length of Time for Cuff Deflation Trial: 60 minutes. Cuff was left deflated when SLP left the room. RN aware. Behavior: Alert;Cooperative;Expresses self well;Good eye contact;Oriented X3;Responsive to questions;Smiling      Melody Gray 08/13/2013, 1:56 PM  Melody Gray Melody Gray [facility], [place] [phone]

## 2013-08-13 NOTE — Progress Notes (Signed)
Admit: 08/08/2013 LOS: 5  1F with OHS, pulm/liver sarcoid, morbid obesity, admit with CHF exacerbation, hypercapneic RF, AKI.  Had surgical airway 08/10/13. BL SCr 0.9-1.1  Subjective:  Tolerating TC Good UOP Improved SCr Pt w/o c/o AFVSS Leaving ICU  04/14 0701 - 04/15 0700 In: [id number] [I.V.:1250; NG/GT:140] Out: 1565 [Urine:1315; Emesis/NG output:250]  Filed Weights   08/11/13 0500 08/12/13 0500 08/13/13 0500  Weight: 117 kg (257 lb 15 oz) 113.853 kg (251 lb) 113.5 kg (250 lb 3.6 oz)    Current meds: reviewed  Current Labs: reviewed    Physical Exam:  Blood pressure 160/91, pulse 63, temperature 98.2 F (36.8 C), temperature source Oral, resp. rate 13, height 5\' 1"  (1.549 m), weight 113.5 kg (250 lb 3.6 oz), last menstrual period 08/08/2013, SpO2 100.00%. Awake, on TC. NAD Morbidly obese RRR Coarse bs b/l No LEE No rashes/lesions  Assessment 1. AKI: likely related to hypovolemia/diuretic use + ARB; improved 2. Hyperkalemia: stable, mild 3. hypercapneic RF s/p tracheostomy 4. Sarcoidosis (Liver/Lung) 5. Proteinuria 6. Morbid obesity 7. DM with hx/o retinopathy  Plan 1. Would cont gentle hydration as long as NPO; change over to 1/2NS at 3mL/hr 2. K is stable; would not treat unless > 6 3. CCM for resp issues 4. Will sign off for now.  Please call with questions or concerns.    Pearson Grippe MD 08/13/2013, 9:51 AM   Recent Labs Lab 08/11/13 1247 08/12/13 0309 08/13/13 0215  NA 146 148* 148*  K 5.1 5.6* 5.5*  CL 100 103 103  CO2 36* 33* 33*  GLUCOSE 151* 155* 142*  BUN 45* 38* 32*  CREATININE 2.07* 1.63* 1.27*  CALCIUM 9.3 9.5 10.1    Recent Labs Lab 08/10/13 0312 08/12/13 0309 08/13/13 0215  WBC 7.0 8.8 5.9  HGB 7.6* 7.4* 7.8*  HCT 28.1* 26.8* 29.0*  MCV 89.5 87.3 89.0  PLT 150 214 219

## 2013-08-13 NOTE — Progress Notes (Signed)
SLP Cancellation Note  Patient Details Name: KATELYND MURAOKA MRN: [id number] DOB: Jan 18, 1971   Cancelled treatment:        Attempted PMSV/BSE, however, pt unavailable at this time due to nursing procedure in room.  RN indicated plan to transfer pt to 2C06.  Will recheck later today.  Jakyiah Briones B. Quentin Ore [facility], [place] [phone]  [name] 08/13/2013, 9:22 AM

## 2013-08-13 NOTE — Progress Notes (Signed)
Trach CK done. Pt in no distress at this time. Sats 100% on 40% TC.

## 2013-08-14 DIAGNOSIS — J9620 Acute and chronic respiratory failure, unspecified whether with hypoxia or hypercapnia: Secondary | ICD-10-CM | POA: Diagnosis not present

## 2013-08-14 DIAGNOSIS — I2789 Other specified pulmonary heart diseases: Secondary | ICD-10-CM | POA: Diagnosis not present

## 2013-08-14 DIAGNOSIS — I509 Heart failure, unspecified: Secondary | ICD-10-CM | POA: Diagnosis not present

## 2013-08-14 DIAGNOSIS — E119 Type 2 diabetes mellitus without complications: Secondary | ICD-10-CM | POA: Diagnosis not present

## 2013-08-14 LAB — BASIC METABOLIC PANEL
BUN: 28 mg/dL — ABNORMAL HIGH (ref 6–23)
CO2: 35 mEq/L — ABNORMAL HIGH (ref 19–32)
Calcium: 10 mg/dL (ref 8.4–10.5)
Chloride: 103 mEq/L (ref 96–112)
Creatinine, Ser: 1.12 mg/dL — ABNORMAL HIGH (ref 0.50–1.10)
GFR calc Af Amer: 68 mL/min — ABNORMAL LOW (ref >90)
GFR calc non Af Amer: 59 mL/min — ABNORMAL LOW (ref >90)
Glucose, Bld: 105 mg/dL — ABNORMAL HIGH (ref 70–99)
Potassium: 4.5 mEq/L (ref 3.7–5.3)
Sodium: 148 mEq/L — ABNORMAL HIGH (ref 137–147)

## 2013-08-14 LAB — GLUCOSE, CAPILLARY
Glucose-Capillary: 102 mg/dL — ABNORMAL HIGH (ref 70–99)
Glucose-Capillary: 130 mg/dL — ABNORMAL HIGH (ref 70–99)
Glucose-Capillary: 134 mg/dL — ABNORMAL HIGH (ref 70–99)
Glucose-Capillary: 142 mg/dL — ABNORMAL HIGH (ref 70–99)
Glucose-Capillary: 199 mg/dL — ABNORMAL HIGH (ref 70–99)
Glucose-Capillary: 255 mg/dL — ABNORMAL HIGH (ref 70–99)
Glucose-Capillary: 94 mg/dL (ref 70–99)

## 2013-08-14 LAB — CBC
HCT: 28.5 % — ABNORMAL LOW (ref 36.0–46.0)
Hemoglobin: 7.7 g/dL — ABNORMAL LOW (ref 12.0–15.0)
MCH: 23.5 pg — ABNORMAL LOW (ref 26.0–34.0)
MCHC: 27 g/dL — ABNORMAL LOW (ref 30.0–36.0)
MCV: 87.2 fL (ref 78.0–100.0)
Platelets: 218 10*3/uL (ref 150–400)
RBC: 3.27 MIL/uL — ABNORMAL LOW (ref 3.87–5.11)
RDW: 18.9 % — ABNORMAL HIGH (ref 11.5–15.5)
WBC: 6.1 10*3/uL (ref 4.0–10.5)

## 2013-08-14 MED ORDER — PREDNISONE 10 MG PO TABS
30.0000 mg | ORAL_TABLET | Freq: Every day | ORAL | Status: DC
  Administered 2013-08-15: 30 mg
  Filled 2013-08-14 (×2): qty 1

## 2013-08-14 MED ORDER — VITAL HIGH PROTEIN PO LIQD
1000.0000 mL | ORAL | Status: DC
  Filled 2013-08-14 (×3): qty 1000

## 2013-08-14 MED ORDER — JEVITY 1.2 CAL PO LIQD
1000.0000 mL | ORAL | Status: DC
  Filled 2013-08-14 (×2): qty 1000

## 2013-08-14 NOTE — Progress Notes (Addendum)
PULMONARY / CRITICAL CARE MEDICINE   Name: Melody Gray MRN: [id number] DOB: 03-17-1971    ADMISSION DATE:  08/08/2013 CONSULTATION DATE:  08/10/13  REFERRING MD :  Marinda Elk ([facility]) PRIMARY SERVICE: IMTS  CHIEF COMPLAINT:  resp failure   BRIEF PATIENT DESCRIPTION: 43yo morbidly obese female active smoker with hx sarcoid (dx 2001 -followed by dr Melvyn Novas) on home O2, NICM, pulm HTN, OSA, CKD admitted 4/10 by IMTS with worsening SOB thought r/t volume overload.  Diuresed with some improvement and was ready for tx to floor.  However 4/12 declined with worsening renal function, hyperkalemia, hypercarbia and placed on bipap and PCCM consulted.   SIGNIFICANT EVENTS / STUDIES:  2D echo 4/12>>>nml LV fn, RVSP 48   LINES / TUBES: 4/12 Emergent Tstomy (Trauma/ Janace Hoard - difficult airway) >>   CULTURES: none   ANTIBIOTICS: none  Subjective/Overnight:  tol ATC daytime, did not tol CPAP/PS for qhs vent, used PRVC. Denies SOB.   VITAL SIGNS: Temp:  [97.2 F (36.2 C)-99.1 F (37.3 C)] 98.6 F (37 C) (04/16 0903) Pulse Rate:  [50-85] 80 (04/16 0903) Resp:  [11-24] 12 (04/16 0903) BP: (115-180)/(67-93) 155/84 mmHg (04/16 0903) SpO2:  [94 %-100 %] 94 % (04/16 0903) FiO2 (%):  [28 %-40 %] 35 % (04/16 0903) Weight:  [253 lb 1.4 oz (114.8 kg)-253 lb 8.5 oz (115 kg)] 253 lb 8.5 oz (115 kg) (04/16 0400) HEMODYNAMICS:   VENTILATOR SETTINGS: Vent Mode:  [-] PRVC FiO2 (%):  [28 %-40 %] 35 % Set Rate:  [14 bmp] 14 bmp Vt Set:  [500 mL] 500 mL PEEP:  [5 cmH20] 5 cmH20 Pressure Support:  [8 cmH20] 8 cmH20 Plateau Pressure:  [22 cmH20-24 cmH20] 24 cmH20 INTAKE / OUTPUT: Intake/Output     04/15 0701 - 04/16 0700 04/16 0701 - 04/17 0700   I.V. (mL/kg) 1100 (9.6)    NG/GT 30    Total Intake(mL/kg) 1130 (9.8)    Urine (mL/kg/hr) 2435 (0.9)    Emesis/NG output 650 (0.2)    Total Output 3085 0   Net -1955 0          PHYSICAL EXAMINATION: General:  Morbidly obese, chronically ill appearing  female, NAD on ATC Neuro: alert, interactive, mouthing words, MAE, follows commands  HEENT:  Mm dry no JVD, bleeding at tstomy site Cardiovascular:  s1s2 rrr Lungs:  resps even non labored , diminished throughout  Abdomen:  Obese, soft, mildly distended, +bs Musculoskeletal:  Warm and dry, 1+ BLE edema   LABS:  CBC  Recent Labs Lab 08/12/13 0309 08/13/13 0215 08/14/13 0252  WBC 8.8 5.9 6.1  HGB 7.4* 7.8* 7.7*  HCT 26.8* 29.0* 28.5*  PLT 214 219 218   Coag's No results found for this basename: APTT, INR,  in the last 168 hours BMET  Recent Labs Lab 08/12/13 0309 08/13/13 0215 08/14/13 0252  NA 148* 148* 148*  K 5.6* 5.5* 4.5  CL 103 103 103  CO2 33* 33* 35*  BUN 38* 32* 28*  CREATININE 1.63* 1.27* 1.12*  GLUCOSE 155* 142* 105*   Electrolytes  Recent Labs Lab 08/12/13 0309 08/13/13 0215 08/14/13 0252  CALCIUM 9.5 10.1 10.0   Sepsis Markers  Recent Labs Lab 08/10/13 1255 08/11/13 0230 08/12/13 0309  LATICACIDVEN 1.0  --   --   PROCALCITON 0.15 0.12 <0.10   ABG  Recent Labs Lab 08/10/13 0918 08/10/13 1200 08/10/13 2051  PHART 7.176* 7.183* 7.336*  PCO2ART 95.9* 98.8* 70.9*  PO2ART 74.3* 116.0* 103.0*  Liver Enzymes No results found for this basename: AST, ALT, ALKPHOS, BILITOT, ALBUMIN,  in the last 168 hours Cardiac Enzymes  Recent Labs Lab 08/08/13 1402 08/08/13 2314 08/09/13 0348 08/09/13 0915  TROPONINI  --  <0.30 <0.30 <0.30  PROBNP 5862.0*  --   --   --    Glucose  Recent Labs Lab 08/13/13 1155 08/13/13 1608 08/13/13 2021 08/13/13 2356 08/14/13 0425 08/14/13 0734  GLUCAP 138* 155* 140* 130* 102* 94    Imaging No results found.   ASSESSMENT / PLAN: Principal Problem:   Acute on chronic respiratory failure Active Problems:   Sarcoidosis   Diabetes mellitus type II, controlled   OBESITY, MORBID   Anemia in chronic kidney disease(285.21)   NICOTINE ADDICTION   Respiratory failure, chronic   NEPHROTIC  SYNDROME   LONG QT SYNDROME   Pulmonary hypertension associated with sarcoidosis   Cardiomyopathy   Volume overload   Acute renal failure   Hyperkalemia   PULMONARY Acute on chronic hypercarbic respiratory failure  Sarcoid  OSA/OHS - Tstomy emergent 4/12  Tobacco use Long standing non adherence to medical advice , see clinic records P:   -Change solumedrol to pred with slow taper 4/16 -- ??taper to off v to low dose maintenance dose (steroids recently stopped as oupt, per pt was more sob after this) -Smoking cessation  -ATC as tolerated daytime, cont attempt PS/CPAP at night - did not tol CPAP/PS but did well with PRVC -Unfortunately expect some vocal cord/ thyroid cartilage damage here per ENT - long term effects on speech unclear, expect will need trach for a long time -Speech following for PMV (not tol well) and swallow eval    CARDIOVASCULAR Acute on chronic dCHF  Hypotension  P:  Cont home coreg, hold ARB Hydralazine prn Replace volume gently    RENAL Acute on chronic renal failure  (baseline Scr ~0.9) -related to ARB/diuretics , improving  Renal ultrasound no obs Hyperkalemia -improved P:   Renal signed off  Cont gentle volume - remains net neg 3L since admit F/u chem   GASTROINTESTINAL A:  No active issue  P:   Start TF  Speech assessing for swallow eval   HEMATOLOGIC Anemia  P:  F/u cbc   INFECTIOUS No s/s active infection , low pct reassuring P:   Monitor fever, wbc off abx   ENDOCRINE DM  P:   Lantus  ssi   NEUROLOGIC AMS - lethargy in setting hypercarbia - resolved P:   Supportive care  PT/OT  GLOBAL - renal failure resolving, tolerating daytime trach collar, remains to be seen if she will require nocturnal vent for OHS.  Unfortunately expect some vocal cord damage here - long term effects on speech unclear. Consider back to IMTS with pulm consult.     Marijean Heath, NP 08/14/2013  10:37 AM Pager: [phone] or [phone]  *Care during the described time interval was provided by me and/or other providers on the critical care team. I have reviewed this patient's available data, including medical history, events of note, physical examination and test results as part of my evaluation.  Attending:  I have seen and examined the patient with nurse practitioner/resident and agree with the note above.   Good LTAC candidate for weaning ATC all day today, may need to go back on vent tonight Continue solumedrol for now, but would wean off over 6 weeks (would change to prednisone) As for Pulmonary hypertension > she wheezes on exam today and  has scarring on her CXR.  Further before the trach she had OSA.  All of these are good secondary causes of pulmonary hypertension and it's really not clear from the literature when patients with PH from sarcoid benefit from pulmonary vasodilators. Considering what she has been through recently I would prefer to treat the bronchospasm vs "sarcoid flare" (less likely) with a long course of steroids.  If not returned to baseline functional status after steroids would set her up for a RHC.  Roselie Awkward, MD [facility] Pager: [phone] Cell: [phone] If no response, call [phone]

## 2013-08-14 NOTE — Progress Notes (Addendum)
NUTRITION CONSULT/FOLLOW UP  Intervention:  Initiate Vital High Protein at 25 ml/h, increase by 10 ml every 4 hours to goal rate of 55 ml/h to provide 1320 kcals (70% of estimated nutrition needs), 116 gm protein (100% of estimated protein needs), 1104 ml free water daily RD to follow for nutrition care plan  New Nutrition Dx: Predicted suboptimal intake related to dysphagia as evidenced by need for short-term EN support, ongoing   Goal: Enteral nutrition to provide 60-70% of estimated calorie needs (22-25 kcals/kg ideal body weight) and 100% of estimated protein needs, based on ASPEN guidelines for permissive underfeeding in critically ill obese individuals, currently unmet  Monitor:  TF regimen & tolerance, PO intake, respiratory status, weight, labs, I/O's  ASSESSMENT: [name] with hx sarcoid (dx 2001 -followed by dr Melody Gray) on home O2, NICM, pulm HTN, OSA, CKD admitted 4/10 with worsening SOB thought r/t volume overload. Diuresed with some improvement and was ready for tx to floor. However 4/12 declined with worsening renal function, hyperkalemia, hypercarbia and placed on bipap.   Patient s/p procedures 4/12: REVISION OF EMERGENT TRACHEOSTOMY  REPAIR OF THYROID CARTILAGE  Patient transferred to 2C-Stepdown from 2S-Surgical ICU 4/15.  Patient is currently on ventilator support (nocturnal)  MV: 8.3 L/min Temp (24hrs), Avg:98 F (36.7 C), Min:97.2 F (36.2 C), Max:98.6 F (37 C)   Patient s/p bedside swallow evaluation today.  PO diet advanced to Dys 1, pudding-thick liquids per SLP 1148.  Plan for FEES as well.  NGT in place.  Tube tip location unavailable per chest X-ray 4/12.  RD consulted for TF initiation & management.  Height: Ht Readings from Last 1 Encounters:  08/13/13 5\' 1"  (1.549 m)    Weight: Wt Readings from Last 1 Encounters:  08/14/13 253 lb 8.5 oz (115 kg)    BMI:  Body mass index is 47.93  kg/(m^2).  Estimated Nutritional Needs: Kcal: 1888 Protein: 95-119 gm Fluid: per MD  Skin: stage 2 wound on coccyx  Diet Order: Dysphagia 1, pudding thick   Intake/Output Summary (Last 24 hours) at 08/14/13 1158 Last data filed at 08/14/13 0900  Gross per 24 hour  Intake    950 ml  Output   2650 ml  Net  -1700 ml    Labs:   Recent Labs Lab 08/12/13 0309 08/13/13 0215 08/14/13 0252  NA 148* 148* 148*  K 5.6* 5.5* 4.5  CL 103 103 103  CO2 33* 33* 35*  BUN 38* 32* 28*  CREATININE 1.63* 1.27* 1.12*  CALCIUM 9.5 10.1 10.0  GLUCOSE 155* 142* 105*    CBG (last 3)   Recent Labs  08/13/13 2356 08/14/13 0425 08/14/13 0734  GLUCAP 130* 102* 94    Scheduled Meds: . antiseptic oral rinse  1 application Mouth Rinse QID  . aspirin EC  81 mg Oral Daily  . carvedilol  25 mg Oral BID WC  . chlorhexidine  15 mL Mouth/Throat BID  . heparin  5,000 Units Subcutaneous 3 times per day  . insulin aspart  0-9 Units Subcutaneous 6 times per day  . insulin glargine  5 Units Subcutaneous QHS  . pantoprazole sodium  40 mg Per Tube Q1200  . [START ON 08/15/2013] predniSONE  30 mg Per Tube Q breakfast    Continuous Infusions: . sodium chloride 50 mL/hr at 08/14/13 0616  . feeding supplement (JEVITY 1.2 CAL)      Past Medical History  Diagnosis Date  . Sarcoidosis  Followed by Dr. [name]; w/ liver involvement per biopsy 12/09, Reversible airway component so started on [facility] 01/2010; HFA 75% p coaching 05/2010  . Hypoxemia     CT angiogram 9/11>> No PE; PFTs 10/11- FEV1 1.20 (49%) with 16% better p B2, DLCO 33%> corrects to 84; O2 sats ok on 4 lpm X rapid walk X 3 laps 05/2010  . Morbid obesity     Target wt= 153 for BMI <30  . QT prolongation   . Diabetes mellitus   . Hypertension   . Hx of cardiac cath 2/08    No CAD, no RAS,  normal EF  . Seborrheic dermatitis of scalp   . 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 Hep B serology neg. but Hep  C ab +, HIV neg. 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 Cardiomyopathy, nonischemic     EF 45% 12/10; Echo 7/11 normal EF, PAS 48  . Diabetic retinopathy     Right eye 2/11  . Health maintenance examination     Mammogram 05/2010 Negative; Last Pap smear 03/2008; Last DM eye exam 2/11> mild non-proliferative diabetic retinopathy. OD  . Helicobacter pylori ab+ 05/2011    Pt was symptomatic and treatment planned for 05/2011  . CHF (congestive heart failure)     Past Surgical History  Procedure Laterality Date  . Tubal ligation    . Breast surgery    . Cesarean section    . Tracheostomy tube placement N/A 08/10/2013    Procedure: TRACHEOSTOMY;  Surgeon: Melody Montane, MD;  Location: [place];  Service: ENT;  Laterality: N/A;    Melody Gray, RD, LDN Pager #: [phone] After-Hours Pager #: [phone]

## 2013-08-14 NOTE — Evaluation (Addendum)
Clinical/Bedside Swallow Evaluation Patient Details  Name: Melody Gray MRN: [id number] Date of Birth: 1970/09/27  Today's Date: 08/14/2013 Time: [id number] SLP Time Calculation (min): 23 min  Past Medical History:  Past Medical History  Diagnosis Date  . Sarcoidosis     Followed by Dr. Melvyn Novas; w/ liver involvement per biopsy 12/09, Reversible airway component so started on [facility] 01/2010; HFA 75% p coaching 05/2010  . Hypoxemia     CT angiogram 9/11>> No PE; PFTs 10/11- FEV1 1.20 (49%) with 16% better p B2, DLCO 33%> corrects to 84; O2 sats ok on 4 lpm X rapid walk X 3 laps 05/2010  . Morbid obesity     Target wt= 153 for BMI <30  . QT prolongation   . Diabetes mellitus   . Hypertension   . Hx of cardiac cath 2/08    No CAD, no RAS,  normal EF  . Seborrheic dermatitis of scalp   . 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 Hep B serology neg. but Hep C ab +, HIV neg. 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 Cardiomyopathy, nonischemic     EF 45% 12/10; Echo 7/11 normal EF, PAS 48  . Diabetic retinopathy     Right eye 2/11  . Health maintenance examination     Mammogram 05/2010 Negative; Last Pap smear 03/2008; Last DM eye exam 2/11> mild non-proliferative diabetic retinopathy. OD  . Helicobacter pylori ab+ 05/2011    Pt was symptomatic and treatment planned for 05/2011  . CHF (congestive heart failure)    Past Surgical History:  Past Surgical History  Procedure Laterality Date  . Tubal ligation    . Breast surgery    . Cesarean section    . Tracheostomy tube placement N/A 08/10/2013    Procedure: TRACHEOSTOMY;  Surgeon: Melissa Montane, MD;  Location: [place];  Service: ENT;  Laterality: N/A;   HPI:  43 year old female admitted 08/08/13 due to increased SOB.  PMH significant for sarcoidosis, HTN, GERD, DM.  CXR 08/12/13 indicates LLL infiltrate, RLL atelectasis. Orders received for PMSV evaluation to improve  communication effectiveness and swallow safety   Assessment / Plan / Recommendation Clinical Impression  Pt demonstrates adequate strength and timing of swallow with purees and thins. Pt is at high risk of decreased airway protection and silent aspriation due to traumatic attempts an intubation and edema. Recommend FEES prior to advancing diet, though prognosis for PO intake does appear good. Pt may consume purees and ice chips until assessment can be completed.     Aspiration Risk  Moderate    Diet Recommendation Alternative means - temporary;Dysphagia 1 (Puree);Ice chips PRN after oral care   Medication Administration: Via alternative means Supervision: Patient able to self feed Compensations: Slow rate;Small sips/bites Postural Changes and/or Swallow Maneuvers: Seated upright 90 degrees    Other  Recommendations Recommended Consults: FEES Oral Care Recommendations: Oral care Q4 per protocol   Follow Up Recommendations  Inpatient Rehab    Frequency and Duration min 2x/week  2 weeks   Pertinent Vitals/Pain NA    SLP Swallow Goals     Swallow Study Prior Functional Status       General HPI: 43 year old female admitted 08/08/13 due to increased SOB.  PMH significant for sarcoidosis, HTN, GERD, DM.  CXR 08/12/13 indicates LLL infiltrate, RLL atelectasis. Orders received for PMSV evaluation to improve communication  effectiveness and swallow safety Type of Study: Bedside swallow evaluation Previous Swallow Assessment: none Diet Prior to this Study: NPO Temperature Spikes Noted: No Respiratory Status: Trach collar Trach Size and Type: #6;Cuff;Deflated;With PMSV in place History of Recent Intubation: No (traumatic attempts, emergent trach) Behavior/Cognition: Alert;Cooperative;Pleasant mood Oral Cavity - Dentition: Adequate natural dentition Self-Feeding Abilities: Able to feed self Patient Positioning: Upright in bed Baseline Vocal Quality: Hoarse;Low vocal  intensity;Breathy Volitional Cough: Strong Volitional Swallow: Able to elicit    Oral/Motor/Sensory Function Overall Oral Motor/Sensory Function: Appears within functional limits for tasks assessed   Ice Chips     Thin Liquid Thin Liquid: Within functional limits    Nectar Thick Nectar Thick Liquid: Not tested   Honey Thick Honey Thick Liquid: Not tested   Puree Puree: Within functional limits   Solid   GO    Solid: Not tested      [name], [place] CCC-SLP [place] [facility] Weinert 08/14/2013,12:10 PM

## 2013-08-14 NOTE — Progress Notes (Signed)
08/14/13 1941  Adult Ventilator Settings  Vent Mode Stand-by  patient using trach collar as tolerated

## 2013-08-14 NOTE — Progress Notes (Addendum)
Physical Therapy Treatment Patient Details Name: Melody Gray MRN: [id number] DOB: 12-21-1970 Today's Date: 08/14/2013    History of Present Illness 47F with OHS, pulm/liver sarcoid, morbid obesity, admit with CHF exacerbation, hypercapneic respiratory failure, acute kidney failure, and emergent trach 08/10/13 (due to difficult intubation).     PT Comments    Pt tolerated session well from a pulmonary standpoint (on 40% trach collar), however was very unsteady when trying to ambulate without the RW. Pt denies prior history of decr balance. Losses of balance occurred when pt was distracted by talking or turning to look for someone behind her while walking. Will continue to follow and assess discharge needs.    Follow Up Recommendations  Home health PT;Other (comment) (Pulmonary Rehab)     Equipment Recommendations  Rolling walker with 5" wheels    Recommendations for Other Services OT consult     Precautions / Restrictions Precautions Precautions: Fall Restrictions Weight Bearing Restrictions: No    Mobility  Bed Mobility Overal bed mobility: Needs Assistance Bed Mobility: Supine to Sit     Supine to sit: Min assist;HOB elevated     General bed mobility comments: did not want to lie HOB down and with difficulty rolling or moving to EOB from this position; pt pulled on therapist's UE to come to sit  Transfers Overall transfer level: Needs assistance Equipment used: Rolling walker (2 wheeled)   Sit to Stand: Minguard assist         General transfer comment: vc for safe use of RW; minguard for safety  Ambulation/Gait Ambulation/Gait assistance: Min assist;+2 safety/equipment Ambulation Distance (Feet): 200 Feet Assistive device: Rolling walker (2 wheeled);1 person hand held assist Gait Pattern/deviations: Step-through pattern;Staggering left;Narrow base of support Gait velocity: Decreased   General Gait Details: Overall steady gait with RW.  Pt did not use a  device PTA, therefore attempted ambulation without a device. Immediately required HHA and had staggering LOB to her left x4 (especially with head turns, or when distracted by trying to lipspeak).    Stairs            Wheelchair Mobility    Modified Rankin (Stroke Patients Only)       Balance Overall balance assessment: Needs assistance                                  Cognition Arousal/Alertness: Awake/alert Behavior During Therapy: WFL for tasks assessed/performed Overall Cognitive Status: Within Functional Limits for tasks assessed                      Exercises General Exercises - Lower Extremity Ankle Circles/Pumps: AROM;Both;15 reps;Supine    General Comments        Pertinent Vitals/Pain 40% trach collar with SaO2 98-100% while walking     Home Living                      Prior Function            PT Goals (current goals can now be found in the care plan section) Acute Rehab PT Goals Patient Stated Goal: To be able to leave the house  PT Goal Formulation: With patient Time For Goal Achievement: 08/19/13 Potential to Achieve Goals: Good    Frequency  Min 3X/week    PT Plan Current plan remains appropriate    Co-evaluation  End of Session Equipment Utilized During Treatment: Gait belt;Oxygen (trach at 35% trach collar) Activity Tolerance: Patient tolerated treatment well Patient left: with call bell/phone within reach;in chair;with family/visitor present     Time: 1128-1200 PT Time Calculation (min): 32 min  Charges:  $Gait Training: 23-37 mins                    G Codes:      Melody Gray Sep 04, 2013, 2:26 PM Pager [phone]

## 2013-08-14 NOTE — Progress Notes (Addendum)
Speech Language Pathology Treatment: Melody Gray Speaking valve  Patient Details Name: Melody Gray MRN: [id number] DOB: Jul 18, 1970 Today's Date: 08/14/2013 Time: [id number] SLP Time Calculation (min): 23 min  Assessment / Plan / Recommendation Clinical Impression  Pt making good progress today with PMSV, subjectively pt feels it was improved from yesterday. Cuff was reinflated overnight for use of vent, but deflation uneventful today. SLP placed valve for 2-5 minute intervals with pt communicating with horse vocal quality and reduced breath support. Suspect limited redirection of air to upper airway due to small airway with edema and 6 cuffed trach. Significant CO2 trapping not noted with removal, but pt did have an increasing feeling of resistance and immediate relief with removal. Pt may wear PMSV with full staff supervision for brief intervals to communicate wants and needs or speak with family.    HPI HPI: 43 year old female admitted 08/08/13 due to increased SOB.  PMH significant for sarcoidosis, HTN, GERD, DM.  CXR 08/12/13 indicates LLL infiltrate, RLL atelectasis. Orders received for PMSV evaluation to improve communication effectiveness and swallow safety   Pertinent Vitals NA  SLP Plan  Continue with current plan of care    Recommendations        Patient may use Passy-Muir Speech Valve: Intermittently with supervision (with Full supervision from staff) PMSV Supervision: Full MD: Please consider changing trach tube to : Smaller size;Cuffless       Oral Care Recommendations: Oral care Q4 per protocol Follow up Recommendations: Inpatient Rehab Plan: Continue with current plan of care    Gray Melody Ryle 08/14/2013, 11:10 AM

## 2013-08-15 ENCOUNTER — Inpatient Hospital Stay
Admission: AD | Admit: 2013-08-15 | Discharge: 2013-09-03 | Disposition: A | Discharge status: Home Health | Payer: Self-pay | Source: Ambulatory Visit | Attending: Internal Medicine | Admitting: Internal Medicine

## 2013-08-15 DIAGNOSIS — D638 Anemia in other chronic diseases classified elsewhere: Secondary | ICD-10-CM | POA: Diagnosis present

## 2013-08-15 DIAGNOSIS — R1310 Dysphagia, unspecified: Secondary | ICD-10-CM | POA: Diagnosis not present

## 2013-08-15 DIAGNOSIS — Z91199 Patient's noncompliance with other medical treatment and regimen due to unspecified reason: Secondary | ICD-10-CM | POA: Diagnosis not present

## 2013-08-15 DIAGNOSIS — E875 Hyperkalemia: Secondary | ICD-10-CM | POA: Diagnosis present

## 2013-08-15 DIAGNOSIS — J9620 Acute and chronic respiratory failure, unspecified whether with hypoxia or hypercapnia: Secondary | ICD-10-CM | POA: Diagnosis not present

## 2013-08-15 DIAGNOSIS — E46 Unspecified protein-calorie malnutrition: Secondary | ICD-10-CM | POA: Diagnosis present

## 2013-08-15 DIAGNOSIS — R5381 Other malaise: Secondary | ICD-10-CM | POA: Diagnosis present

## 2013-08-15 DIAGNOSIS — R042 Hemoptysis: Secondary | ICD-10-CM | POA: Diagnosis not present

## 2013-08-15 DIAGNOSIS — Z9119 Patient's noncompliance with other medical treatment and regimen: Secondary | ICD-10-CM | POA: Diagnosis not present

## 2013-08-15 DIAGNOSIS — I5033 Acute on chronic diastolic (congestive) heart failure: Secondary | ICD-10-CM | POA: Diagnosis present

## 2013-08-15 DIAGNOSIS — M6281 Muscle weakness (generalized): Secondary | ICD-10-CM | POA: Diagnosis not present

## 2013-08-15 DIAGNOSIS — R5383 Other fatigue: Secondary | ICD-10-CM | POA: Diagnosis present

## 2013-08-15 DIAGNOSIS — I509 Heart failure, unspecified: Secondary | ICD-10-CM | POA: Diagnosis not present

## 2013-08-15 DIAGNOSIS — N189 Chronic kidney disease, unspecified: Secondary | ICD-10-CM | POA: Diagnosis present

## 2013-08-15 DIAGNOSIS — E119 Type 2 diabetes mellitus without complications: Secondary | ICD-10-CM | POA: Diagnosis not present

## 2013-08-15 DIAGNOSIS — Z862 Personal history of diseases of the blood and blood-forming organs and certain disorders involving the immune mechanism: Secondary | ICD-10-CM

## 2013-08-15 DIAGNOSIS — G4733 Obstructive sleep apnea (adult) (pediatric): Secondary | ICD-10-CM | POA: Diagnosis present

## 2013-08-15 DIAGNOSIS — I428 Other cardiomyopathies: Secondary | ICD-10-CM | POA: Diagnosis not present

## 2013-08-15 DIAGNOSIS — IMO0002 Reserved for concepts with insufficient information to code with codable children: Secondary | ICD-10-CM | POA: Diagnosis not present

## 2013-08-15 DIAGNOSIS — Z87891 Personal history of nicotine dependence: Secondary | ICD-10-CM | POA: Diagnosis not present

## 2013-08-15 DIAGNOSIS — J90 Pleural effusion, not elsewhere classified: Secondary | ICD-10-CM | POA: Diagnosis not present

## 2013-08-15 DIAGNOSIS — Z430 Encounter for attention to tracheostomy: Secondary | ICD-10-CM | POA: Diagnosis not present

## 2013-08-15 DIAGNOSIS — J9509 Other tracheostomy complication: Secondary | ICD-10-CM | POA: Diagnosis not present

## 2013-08-15 DIAGNOSIS — J988 Other specified respiratory disorders: Secondary | ICD-10-CM | POA: Diagnosis not present

## 2013-08-15 DIAGNOSIS — J9600 Acute respiratory failure, unspecified whether with hypoxia or hypercapnia: Secondary | ICD-10-CM | POA: Diagnosis not present

## 2013-08-15 DIAGNOSIS — I279 Pulmonary heart disease, unspecified: Secondary | ICD-10-CM | POA: Diagnosis present

## 2013-08-15 DIAGNOSIS — D869 Sarcoidosis, unspecified: Secondary | ICD-10-CM | POA: Diagnosis not present

## 2013-08-15 DIAGNOSIS — N179 Acute kidney failure, unspecified: Secondary | ICD-10-CM | POA: Diagnosis present

## 2013-08-15 DIAGNOSIS — Z8639 Personal history of other endocrine, nutritional and metabolic disease: Secondary | ICD-10-CM

## 2013-08-15 DIAGNOSIS — E669 Obesity, unspecified: Secondary | ICD-10-CM | POA: Diagnosis present

## 2013-08-15 LAB — GLUCOSE, CAPILLARY
Glucose-Capillary: 160 mg/dL — ABNORMAL HIGH (ref 70–99)
Glucose-Capillary: 168 mg/dL — ABNORMAL HIGH (ref 70–99)
Glucose-Capillary: 173 mg/dL — ABNORMAL HIGH (ref 70–99)
Glucose-Capillary: 254 mg/dL — ABNORMAL HIGH (ref 70–99)
Glucose-Capillary: 269 mg/dL — ABNORMAL HIGH (ref 70–99)

## 2013-08-15 LAB — BASIC METABOLIC PANEL
BUN: 21 mg/dL (ref 6–23)
CO2: 39 mEq/L — ABNORMAL HIGH (ref 19–32)
Calcium: 9.3 mg/dL (ref 8.4–10.5)
Chloride: 98 mEq/L (ref 96–112)
Creatinine, Ser: 0.99 mg/dL (ref 0.50–1.10)
GFR calc Af Amer: 79 mL/min — ABNORMAL LOW (ref >90)
GFR calc non Af Amer: 68 mL/min — ABNORMAL LOW (ref >90)
Glucose, Bld: 174 mg/dL — ABNORMAL HIGH (ref 70–99)
Potassium: 4.7 mEq/L (ref 3.7–5.3)
Sodium: 144 mEq/L (ref 137–147)

## 2013-08-15 MED ORDER — INSULIN GLARGINE 100 UNIT/ML ~~LOC~~ SOLN: 5.0000 [IU] | Freq: Every day | SUBCUTANEOUS | Status: DC

## 2013-08-15 MED ORDER — ASPIRIN 81 MG PO TBEC: 81.0000 mg | DELAYED_RELEASE_TABLET | Freq: Every day | ORAL | Status: DC

## 2013-08-15 MED ORDER — BIOTENE DRY MOUTH MT LIQD: 1.0000 "application " | Freq: Four times a day (QID) | OROMUCOSAL | Status: DC

## 2013-08-15 MED ORDER — ACETAMINOPHEN 325 MG PO TABS: 650.0000 mg | ORAL_TABLET | ORAL | Status: DC | PRN

## 2013-08-15 MED ORDER — HYDRALAZINE HCL 20 MG/ML IJ SOLN: 10.0000 mg | INTRAMUSCULAR | Status: DC | PRN

## 2013-08-15 MED ORDER — PANTOPRAZOLE SODIUM 40 MG PO PACK: 40.0000 mg | PACK | Freq: Every day | ORAL | Status: DC

## 2013-08-15 MED ORDER — HEPARIN SODIUM (PORCINE) 5000 UNIT/ML IJ SOLN: 5000.0000 [IU] | Freq: Three times a day (TID) | INTRAMUSCULAR | Status: DC

## 2013-08-15 MED ORDER — CHLORHEXIDINE GLUCONATE 0.12 % MT SOLN: 15.0000 mL | Freq: Two times a day (BID) | OROMUCOSAL | Status: DC

## 2013-08-15 MED ORDER — INSULIN ASPART 100 UNIT/ML ~~LOC~~ SOLN: SUBCUTANEOUS | Status: DC

## 2013-08-15 MED ORDER — PREDNISONE 10 MG PO TABS: 30.0000 mg | ORAL_TABLET | Freq: Every day | ORAL | Status: DC

## 2013-08-15 MED ORDER — SODIUM CHLORIDE 0.45 % IV SOLN: INTRAVENOUS | Status: DC

## 2013-08-15 NOTE — Progress Notes (Signed)
Pt says she's excited about going to [facility] upstairs. Called [facility] said they are still waiting for bed to open up but that pt should transfer before bedtine. Pt's family aware. Pt up in chair several times today. Minimal suctioning. Using BSC ioth minimal assist. Medicated X1 on this shift for trach/surgical site soreness.

## 2013-08-15 NOTE — Progress Notes (Signed)
5 Days Post-Op  Subjective: Is doing well. She is breathing through the trach well. She has heard her voice several times with the balloon now deflated. She is not having any pain of significance.  Objective: Vital signs in last 24 hours: Temp:  [98.1 F (36.7 C)-98.9 F (37.2 C)] 98.4 F (36.9 C) (04/17 1600) Pulse Rate:  [72-83] 72 (04/17 1600) Resp:  [13-20] 16 (04/17 1600) BP: (128-165)/(63-88) 136/63 mmHg (04/17 1600) SpO2:  [95 %-100 %] 97 % (04/17 1600) FiO2 (%):  [35 %-40 %] 35 % (04/17 1532) Weight:  [109.5 kg (241 lb 6.5 oz)] 109.5 kg (241 lb 6.5 oz) (04/17 0408) Last BM Date: 08/14/13  Intake/Output from previous day: 04/16 0701 - 04/17 0700 In: [id number] [I.V.:1170; NG/GT:585] Out: 1000 [Urine:1000] Intake/Output this shift: Total I/O In: 675 [P.O.:225; I.V.:450] Out: 880 [Urine:880]  The trach is in position. The balloon is deflated. There is some secretions.  Lab Results:   Recent Labs  08/13/13 0215 08/14/13 0252  WBC 5.9 6.1  HGB 7.8* 7.7*  HCT 29.0* 28.5*  PLT 219 218   BMET  Recent Labs  08/14/13 0252 08/15/13 0405  NA 148* 144  K 4.5 4.7  CL 103 98  CO2 35* 39*  GLUCOSE 105* 174*  BUN 28* 21  CREATININE 1.12* 0.99  CALCIUM 10.0 9.3   PT/INR No results found for this basename: LABPROT, INR,  in the last 72 hours ABG No results found for this basename: PHART, PCO2, PO2, HCO3,  in the last 72 hours  Studies/Results: No results found.  Anti-infectives: Anti-infectives   None      Assessment/Plan: s/p Procedure(s): TRACHEOSTOMY (N/A) We had a long discussion regarding the situation. She understands that there is some damage to her larynx and at this point there is not anything specific to do other than wait and see how the scarring declares itself. I will change her to an uncuffed Shiley on Monday and try to see if we can plug the trach. Also a fiberoptic exam of her larynx probably can be performed now has hopefully some of the  swelling has decreased. I. will followup on Monday for a fiberoptic exam and trach change.  LOS: 7 days    Melissa Montane 08/15/2013

## 2013-08-15 NOTE — Procedures (Signed)
Objective Swallowing Evaluation: Fiberoptic Endoscopic Evaluation of Swallowing  Patient Details  Name: Melody Gray MRN: [id number] Date of Birth: 31-Jan-1971  Today's Date: 08/15/2013 Time: [id number] SLP Time Calculation (min): 34 min  Past Medical History:  Past Medical History  Diagnosis Date  . Sarcoidosis     Followed by Dr. Melvyn Novas; w/ liver involvement per biopsy 12/09, Reversible airway component so started on [facility] 01/2010; HFA 75% p coaching 05/2010  . Hypoxemia     CT angiogram 9/11>> No PE; PFTs 10/11- FEV1 1.20 (49%) with 16% better p B2, DLCO 33%> corrects to 84; O2 sats ok on 4 lpm X rapid walk X 3 laps 05/2010  . Morbid obesity     Target wt= 153 for BMI <30  . QT prolongation   . Diabetes mellitus   . Hypertension   . Hx of cardiac cath 2/08    No CAD, no RAS,  normal EF  . Seborrheic dermatitis of scalp   . 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 Hep B serology neg. but Hep C ab +, HIV neg. 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 Cardiomyopathy, nonischemic     EF 45% 12/10; Echo 7/11 normal EF, PAS 48  . Diabetic retinopathy     Right eye 2/11  . Health maintenance examination     Mammogram 05/2010 Negative; Last Pap smear 03/2008; Last DM eye exam 2/11> mild non-proliferative diabetic retinopathy. OD  . Helicobacter pylori ab+ 05/2011    Pt was symptomatic and treatment planned for 05/2011  . CHF (congestive heart failure)    Past Surgical History:  Past Surgical History  Procedure Laterality Date  . Tubal ligation    . Breast surgery    . Cesarean section    . Tracheostomy tube placement N/A 08/10/2013    Procedure: TRACHEOSTOMY;  Surgeon: Melissa Montane, MD;  Location: [place];  Service: ENT;  Laterality: N/A;   HPI:  43 year old female admitted 08/08/13 due to increased SOB.  PMH significant for sarcoidosis, HTN, GERD, DM.  CXR 08/12/13 indicates LLL infiltrate, RLL atelectasis. Orders  received for PMSV evaluation to improve communication effectiveness and swallow safety     Assessment / Plan / Recommendation Clinical Impression  Dysphagia Diagnosis: Mild pharyngeal phase dysphagia Clinical impression: Pt presents with a mild oropahrygneal dyspahgia with occasional premature spillage/delay in swallow initiation with purees and liquids. The pt has an extremely small anatomical structures (pharyngeal cavity, epiglottis) which are also filled with adipose tissue. The valleculae and lateral channels are obliterated by fatty tissue. The arytenoids and false folds are edematous and also covered in tissue. Liquids spill to pharynx they do not collect in negative space but sometime penetrate the vestibule minimally before the swallow. Pt penetrated only trace amounts of thin and puree above the cords. When asked to complete a chin tuck the bolus collected in the increased vallecular space and was not penetrated over multiple trials (thin straw, regular textures). Of note, this test was not done with PMSV in place. SLP attempted to place PMSV, but pt did not tolerate for more than two bolus trials. She is clearly unable to redirect any quantity of air past trach and through small tiny glottic space. The pt would not be able to wear valve during meals and based on findings may not tolerate a speaking valve into the long term unless trach is downsized.  Despite this, pt is recommended to initiate a regular diet and thin liquids with a chin tuck. SLP at [facility] to f/u for reinforcement of strategies.     Treatment Recommendation  Defer treatment plan to SLP at (Comment) [facility])    Diet Recommendation Regular;Thin liquid   Liquid Administration via: Cup;Straw Medication Administration: Whole meds with liquid Supervision: Patient able to self feed Compensations: Slow rate;Small sips/bites Postural Changes and/or Swallow Maneuvers: Seated upright 90 degrees;Chin tuck    Other  Recommendations      Follow Up Recommendations  LTACH    Frequency and Duration        Pertinent Vitals/Pain NA    SLP Swallow Goals     General HPI: 43 year old female admitted 08/08/13 due to increased SOB.  PMH significant for sarcoidosis, HTN, GERD, DM.  CXR 08/12/13 indicates LLL infiltrate, RLL atelectasis. Orders received for PMSV evaluation to improve communication effectiveness and swallow safety Type of Study: Fiberoptic Endoscopic Evaluation of Swallowing Reason for Referral: Objectively evaluate swallowing function Previous Swallow Assessment: none Diet Prior to this Study: Dysphagia 1 (puree) Temperature Spikes Noted: No Respiratory Status: Trach collar Trach Size and Type: #6;Cuff;Deflated;With PMSV not in place;With PMSV in place History of Recent Intubation: No Behavior/Cognition: Alert;Cooperative;Pleasant mood Oral Cavity - Dentition: Adequate natural dentition Oral Motor / Sensory Function: Within functional limits Self-Feeding Abilities: Able to feed self Patient Positioning: Upright in chair Baseline Vocal Quality: Aphonic (PMSV not in place) Volitional Cough: Other (Comment) (unable d/t PMSV not in place) Volitional Swallow: Able to elicit Anatomy: Other (Comment) (Extremely small airway, adipose tissue ) Pharyngeal Secretions: Normal    Reason for Referral Objectively evaluate swallowing function   Oral Phase Oral Preparation/Oral Phase Oral Phase: WFL   Pharyngeal Phase Pharyngeal Phase Pharyngeal Phase: Impaired Pharyngeal - Thin Pharyngeal - Thin Cup: Delayed swallow initiation;Premature spillage to valleculae;Penetration/Aspiration before swallow;Trace aspiration Penetration/Aspiration details (thin cup): Material enters airway, CONTACTS cords and not ejected out;Material does not enter airway Pharyngeal - Thin Straw: Delayed swallow initiation;Premature spillage to valleculae;Penetration/Aspiration before swallow;Trace aspiration Penetration/Aspiration details (thin  straw): Material does not enter airway;Material enters airway, remains ABOVE vocal cords and not ejected out Pharyngeal - Solids Pharyngeal - Puree: Delayed swallow initiation;Premature spillage to valleculae;Penetration/Aspiration before swallow;Trace aspiration Penetration/Aspiration details (puree): Material enters airway, remains ABOVE vocal cords and not ejected out;Material does not enter airway Pharyngeal - Regular: Within functional limits  Cervical Esophageal Phase    GO    Cervical Esophageal Phase Cervical Esophageal Phase: Impaired Cervical Esophageal Phase - Comment Cervical Esophageal Comment: Impaired by presence of large NG tube. Mild residuals collecting around the tube.          Katherene Ponto Hasnain [place] 08/15/2013, 12:16 PM

## 2013-08-15 NOTE — Progress Notes (Signed)
Dr Melissa Montane wants to change out trach at bedside on MOnday. Obtained # 4 and # 6 uncuffed Shiley trachs. [facility] will have to provide ENT/Trach kit at bedside to assist.

## 2013-08-15 NOTE — Discharge Summary (Addendum)
Physician Discharge Summary  Patient ID: SANITA ESTRADA MRN: [id number] DOB/AGE: 07/09/1970 43 y.o.  Admit date: 08/08/2013 Discharge date: 09/06/2013    Discharge Diagnoses:  Acute on Chronic Hypercarbic Respiratory Failure  Sarcoid  OSA/OHS  Emergent Tracheostomy   Tobacco Abuse Long standing non adherence to medical advice , see clinic records Pulmonary Hypertension Acute on Chronic dCHF  Hypotension  Acute on Chronic Renal Failure Dysphagia  Hyperkalemia  Anemia DM Acute Encephalopathy                                                                      DISCHARGE PLAN BY DIAGNOSIS     Acute on Chronic Hypercarbic Respiratory Failure  Sarcoid  OSA/OHS  Emergent Tracheostomy   Tobacco Abuse Long standing non adherence to medical advice , see clinic records Pulmonary Hypertension   Discharge Plan: -daytime ATC 40% as tolerated, PSV QHS  -prednisone taper - with wean to off over 6 weeks -smoking cessaton  -Unfortunately expect some vocal cord/ thyroid cartilage damage here per ENT -long term effects on speech unclear, expect will need trach for a long time  -Speech following for PMV (not tol well)  -In regard to [facility], she has scarring on her CXR. Further, before the trach she had OSA.  All of these are good secondary causes of pulmonary hypertension and it's really not clear from the literature when patients with PH from sarcoid benefit from pulmonary vasodilators. Considering what she has been through recently I would prefer to treat the bronchospasm vs "sarcoid flare" (less likely) with a long course of steroids. If not returned to baseline functional status after steroids would set her up for a RHC. -WOULD NOT DECANNULATE or DOWNSIZE at this time.  We will follow for potential downsize.     Acute on Chronic dCHF  Hypotension   Discharge Plan: -continue coreg -hold previous home medications:  Lasix 40 mg QD, diovan 40 mg QD, Avapro 372m QD  Acute on Chronic Renal  Failure  Discharge Plan: -trend BMP  Dysphagia   Discharge Plan: -cleared by SLP for thin liquid, regular diet with chin tuck -initiate heart healthy, carb modified diet -PPI  -stop TF 42024/05/09as pt has passed MBS with SLP, if PO intake is not sufficient, may need to consider supplementation with TF  Hyperkalemia   Discharge Plan: -resolved, no further interventions  Anemia  Discharge Plan: -minimize lab draws as able -transfuse if Hgb <7%, active bleeding or MI <8%  DM  Discharge Plan: -continue SSI + lantus -hold home medications:  Metformin, 70/30  -consider restart of home meds closer to d/c   Acute Encephalopathy   Discharge Plan: -resolved, no further needs -PT efforts                 DISCHARGE SUMMARY   Melody Gray is a 43y.o. y/o female, active smoker with a PMH of sarcoidosis (dx 2001 -followed by dr WMelvyn Novas on home O2 (since 2011), NICM, pulm HTN, OSA, CKD admitted 4/10 by IMTS with worsening SOB thought related to volume overload. Recently, her baseline prednisone had been stopped in March per Dr. WMelvyn Novas   She was diuresed with some improvement and was ready for tx to floor. During floor admission, she  was noted to have periods of apnea on bipap at night.  However, on 4/12 she declined with worsening renal function, hyperkalemia, anemia requiring transfusion, hypercarbia and placed on bipap with transfer to ICU. PCCM consulted for evaluation.  She unfortunately did not tolerated BiPAP and had worsening of respiratory status in the setting of acute renal failure.  ABG at that time:  7.1 / 95 / 74 / 34.  Despite efforts of bipap, she required emergent intubation in the setting of hypercarbia & worsening mental status.  PATIENT WAS A VERY DIFFICULT AIRWAY (she has extremely small mouth, large tongue, and very anterior larynx) - requiring efforts of CCS, Anesthesia, & CCM to obtain a temporary airway.  She was then taken to the OR on 4/12 PM for revision / placement of  tracheostomy.  Post tracheostomy placement, she was returned to ICU.  Patient was able to transition to ATC during day time and nocturnal vent support at night.  Thus far, she has not been successful with PSV at night. Has required PRVC.    In the setting of acute renal failure reltated to  ARB + diuresis, her home medications were held and have not been restarted.  Offending agents were held, patient was gently hydrated and sr cr has returned to normal.    She was evaluated by SLP for swallowing needs.  Patient passed MBS on 4/17 and was cleared for regular consistency diet, thin liquids with chin tuck.  Please see discharge plans as above for details.    For Sarcoid, would plan to treat with prednisone tapered from 27m down to off over 6 weeks.    Regarding tracheostomy, it is helping her OSA and given her difficult airway would not consider decannulation for minimum 3-4 weeks.  May want to consider lifelong trach to assist with OSA.             SIGNIFICANT EVENTS / STUDIES:  2D echo 4/12>>>nml LV fn, RVSP 48    LINES / TUBES:  4/12 Emergent Tstomy (Trauma/ BJanace Hoard- difficult airway) >>     Discharge Exam: General: Morbidly obese, chronically ill appearing female, NAD on ATC  Neuro: alert, interactive, mouthing words, MAE, follows commands  HEENT: Mm dry no JVD, bleeding at tstomy site  Cardiovascular: s1s2 rrr  Lungs: resps even non labored , diminished throughout  Abdomen: Obese, soft, mildly distended, +bs  Musculoskeletal: Warm and dry, 1+ BLE edema    Filed Vitals:   08/15/13 0304 08/15/13 0408 08/15/13 0754 08/15/13 1129  BP:  153/81 128/68 145/75  Pulse:  74 77 83  Temp:  98.8 F (37.1 C) 98.3 F (36.8 C)   TempSrc:  Oral Oral   Resp: '17 14 13 20  ' Height:      Weight:  241 lb 6.5 oz (109.5 kg)    SpO2: 96% 100% 99% 100%     Discharge Labs  BMET  Recent Labs Lab 08/11/13 1247 08/12/13 0309 08/13/13 0215 08/14/13 0252 08/15/13 0405  NA 146 148* 148*  148* 144  K 5.1 5.6* 5.5* 4.5 4.7  CL 100 103 103 103 98  CO2 36* 33* 33* 35* 39*  GLUCOSE 151* 155* 142* 105* 174*  BUN 45* 38* 32* 28* 21  CREATININE 2.07* 1.63* 1.27* 1.12* 0.99  CALCIUM 9.3 9.5 10.1 10.0 9.3    CBC  Recent Labs Lab 08/12/13 0309 08/13/13 0215 08/14/13 0252  HGB 7.4* 7.8* 7.7*  HCT 26.8* 29.0* 28.5*  WBC 8.8 5.9 6.1  PLT  214 219 218    Anti-Coagulation No results found for this basename: INR,  in the last 168 hours      Discharge Orders   Future Appointments Provider Department Dept Phone   09/23/2013 3:15 PM Jerene Pitch, MD [place] [phone]   Future Orders Complete By Expires   Activity as tolerated - No restrictions  As directed    Call MD for:  difficulty breathing, headache or visual disturbances  As directed    Call MD for:  persistant dizziness or light-headedness  As directed    Call MD for:  severe uncontrolled pain  As directed    Call MD for:  temperature >100.4  As directed    Diet - low sodium heart healthy  As directed    Diet Carb Modified  As directed    Discharge instructions  As directed             Medication List    STOP taking these medications       furosemide 40 MG tablet  Commonly known as:  LASIX     insulin aspart protamine- aspart (70-30) 100 UNIT/ML injection  Commonly known as:  NOVOLOG MIX 70/30     irbesartan 150 MG tablet  Commonly known as:  AVAPRO     metFORMIN 1000 MG tablet  Commonly known as:  GLUCOPHAGE     omeprazole 40 MG capsule  Commonly known as:  PRILOSEC     valsartan 40 MG tablet  Commonly known as:  DIOVAN      TAKE these medications       acetaminophen 325 MG tablet  Commonly known as:  TYLENOL  Take 2 tablets (650 mg total) by mouth every 4 (four) hours as needed for fever or mild pain.     antiseptic oral rinse Liqd  15 mLs by Mouth Rinse route QID.     aspirin 81 MG EC tablet  Take 1 tablet (81 mg total) by mouth daily.      carvedilol 25 MG tablet  Commonly known as:  COREG  take 1 tablet by mouth twice a day with food     chlorhexidine 0.12 % solution  Commonly known as:  PERIDEX  Use as directed 15 mLs in the mouth or throat 2 (two) times daily.     heparin 5000 UNIT/ML injection  Inject 1 mL (5,000 Units total) into the skin every 8 (eight) hours.     hydrALAZINE 20 MG/ML injection  Commonly known as:  APRESOLINE  Inject 0.5-2 mLs (10-40 mg total) into the vein every 4 (four) hours as needed (SBp 180 & above).     insulin aspart 100 UNIT/ML injection  Commonly known as:  novoLOG  - CBG < 70: implement hypoglycemia protocol  - CBG 70 - 120: 0 units  - CBG 121 - 150: 1 unit  - CBG 151 - 200: 2 units  - CBG 201 - 250: 3 units  - CBG 251 - 300: 5 units  - CBG 301 - 350: 7 units  - CBG 351 - 400: 9 units  - CBG > 400: call MD and obtain STAT lab verification     insulin glargine 100 UNIT/ML injection  Commonly known as:  LANTUS  Inject 0.05 mLs (5 Units total) into the skin at bedtime.     pantoprazole sodium 40 mg/20 mL Pack  Commonly known as:  PROTONIX  Place 20 mLs (40 mg total) into feeding tube daily at  12 noon.     predniSONE 10 MG tablet  Commonly known as:  DELTASONE  Place 3 tablets (30 mg total) into feeding tube daily with breakfast.     sodium chloride 0.45 % solution  50 ml / hr          Disposition: [facility]  Discharged Condition: Vincie S Rohner has met maximum benefit of inpatient care and is medically stable and cleared for discharge.  Patient is pending follow up as above.      Time spent on disposition:  Greater than 35 minutes.   Signed: Noe Gens, NP-C [facility] Pgr: [phone] Office: [phone]    Attending:  I have seen and examined the patient with nurse practitioner/resident and agree with the note above.   Roselie Awkward, MD [facility] Pager: [phone] Cell: [phone] If no response, call [phone]

## 2013-08-15 NOTE — Progress Notes (Signed)
Speech Language Pathology Treatment: [name] Speaking valve;Dysphagia  Patient Details Name: Melody Gray MRN: [id number] DOB: 1970/12/31 Today's Date: 08/15/2013 Time: [id number] SLP Time Calculation (min): 45 min  Assessment / Plan / Recommendation Clinical Impression  PMSV treatment given during FEES. Pt only able to tolerate very brief placement with significant build up of subglottic air and air behind valve. The pt was cued to attempt throat clear during FEES with PMSV in place, redirection of air insufficient to clear airway. Pt visibly has an extremely small anatomical airway and may be unlikely to tolerate valve at all. She may have better success with a smaller trach, but even so, may still struggle. Also provided min verbal cues after FEES for pt to initiate meal and utilize chin tuck. Observed pt with trach suction with RN, no return of green dye reinforcing no significant aspiration during study. SLP at [facility] to f/u for PMSV and swallowing precautions. Pt may keep PMSV with brief placement from RN if pt wishes to verbalize with family. Full supervision only, may only wear for up to 1 minute.    HPI HPI: 43 year old female admitted 08/08/13 due to increased SOB.  PMH significant for sarcoidosis, HTN, GERD, DM.  CXR 08/12/13 indicates LLL infiltrate, RLL atelectasis. Orders received for PMSV evaluation to improve communication effectiveness and swallow safety   Pertinent Vitals NA  SLP Plan  Discharge SLP treatment due to (comment) (pt to d/c to [facility])    Recommendations Diet recommendations: Regular;Thin liquid Liquids provided via: Cup;Straw Medication Administration: Whole meds with liquid Supervision: Patient able to self feed Compensations: Slow rate;Small sips/bites Postural Changes and/or Swallow Maneuvers: Seated upright 90 degrees;Chin tuck      Patient may use Passy-Muir Speech Valve: Intermittently with supervision PMSV Supervision: Full MD: Please consider changing  trach tube to : Smaller size;Cuffless       Follow up Recommendations: LTACH Plan: Discharge SLP treatment due to (comment) (pt to d/c to [facility])    GO    Melody Baltimore, MA CCC-SLP [phone]  Melody Gray 08/15/2013, 12:26 PM

## 2013-08-15 NOTE — Progress Notes (Signed)
Called report to Systems analyst at [employer]	[place]. Aware that trachs # 4 & # 6 uncuffed will be sent with pt. [facility] will still need to get a trach/ENT tray at bedside for change out on Monday. Night shift RN will discharge pt and transfer to [facility].

## 2013-08-15 NOTE — Progress Notes (Signed)
Speech working with pt doing FEES study call to MD order to D/C TF and start on diet received per recommendation

## 2013-08-16 ENCOUNTER — Other Ambulatory Visit (HOSPITAL_COMMUNITY): Payer: Self-pay

## 2013-08-16 DIAGNOSIS — J9600 Acute respiratory failure, unspecified whether with hypoxia or hypercapnia: Secondary | ICD-10-CM | POA: Diagnosis not present

## 2013-08-16 LAB — URINALYSIS, ROUTINE W REFLEX MICROSCOPIC
Bilirubin Urine: NEGATIVE
Glucose, UA: NEGATIVE mg/dL
Hgb urine dipstick: NEGATIVE
Ketones, ur: NEGATIVE mg/dL
Leukocytes, UA: NEGATIVE
Nitrite: NEGATIVE
Protein, ur: 30 mg/dL — AB
Specific Gravity, Urine: 1.018 (ref 1.005–1.030)
Urobilinogen, UA: 2 mg/dL — ABNORMAL HIGH (ref 0.0–1.0)
pH: 6.5 (ref 5.0–8.0)

## 2013-08-16 LAB — COMPREHENSIVE METABOLIC PANEL
ALT: 44 U/L — ABNORMAL HIGH (ref 0–35)
AST: 13 U/L (ref 0–37)
Albumin: 2.6 g/dL — ABNORMAL LOW (ref 3.5–5.2)
Alkaline Phosphatase: 80 U/L (ref 39–117)
BUN: 16 mg/dL (ref 6–23)
CO2: 39 mEq/L — ABNORMAL HIGH (ref 19–32)
Calcium: 9.1 mg/dL (ref 8.4–10.5)
Chloride: 98 mEq/L (ref 96–112)
Creatinine, Ser: 0.89 mg/dL (ref 0.50–1.10)
GFR calc Af Amer: >90 mL/min (ref >90)
GFR calc non Af Amer: 78 mL/min — ABNORMAL LOW (ref >90)
Glucose, Bld: 119 mg/dL — ABNORMAL HIGH (ref 70–99)
Potassium: 4 mEq/L (ref 3.7–5.3)
Sodium: 142 mEq/L (ref 137–147)
Total Bilirubin: 0.4 mg/dL (ref 0.3–1.2)
Total Protein: 6.3 g/dL (ref 6.0–8.3)

## 2013-08-16 LAB — TSH: TSH: 2.5 u[IU]/mL (ref 0.350–4.500)

## 2013-08-16 LAB — BLOOD GAS, ARTERIAL
Acid-Base Excess: 13.3 mmol/L — ABNORMAL HIGH (ref 0.0–2.0)
Bicarbonate: 39.7 mEq/L — ABNORMAL HIGH (ref 20.0–24.0)
FIO2: 0.28 %
O2 Saturation: 95 %
Patient temperature: 98.6
TCO2: 42 mmol/L (ref 0–100)
pCO2 arterial: 77.2 mmHg (ref 35.0–45.0)
pH, Arterial: 7.331 — ABNORMAL LOW (ref 7.350–7.450)
pO2, Arterial: 77.2 mmHg — ABNORMAL LOW (ref 80.0–100.0)

## 2013-08-16 LAB — PROCALCITONIN: Procalcitonin: <0.1 ng/mL

## 2013-08-16 LAB — CBC
HCT: 29.5 % — ABNORMAL LOW (ref 36.0–46.0)
Hemoglobin: 8.1 g/dL — ABNORMAL LOW (ref 12.0–15.0)
MCH: 23.9 pg — ABNORMAL LOW (ref 26.0–34.0)
MCHC: 27.5 g/dL — ABNORMAL LOW (ref 30.0–36.0)
MCV: 87 fL (ref 78.0–100.0)
Platelets: 223 10*3/uL (ref 150–400)
RBC: 3.39 MIL/uL — ABNORMAL LOW (ref 3.87–5.11)
RDW: 17.8 % — ABNORMAL HIGH (ref 11.5–15.5)
WBC: 6.8 10*3/uL (ref 4.0–10.5)

## 2013-08-16 LAB — URINE MICROSCOPIC-ADD ON

## 2013-08-16 LAB — PREALBUMIN: Prealbumin: 20.5 mg/dL (ref 17.0–34.0)

## 2013-08-16 NOTE — Progress Notes (Signed)
[facility]                                                                                              Progress note     Patient Demographics  Melody Gray, is a 43 y.o. female  [id number]  [id number]  DOB - 08/06/1970  Admit date - 08/15/2013  Admitting Physician Merton Border, MD  Outpatient Primary MD for the patient is Jerene Pitch, MD  LOS - 1   No chief complaint on file.          Subjective:   Elisabella Cech   Objective:   Vital signs  Temperature Heart rate Respiratory rate Blood pressure Pulse ox    Exam Awake Alert, Oriented X 3, No new F.N deficits, Normal affect Pink.AT,PERRAL Supple Neck,No JVD, No cervical lymphadenopathy appriciated.  Tracheostomy midline Symmetrical Chest wall movement, scattered rhonchi, sounds referred from upper airway RRR,No Gallops,Rubs or new Murmurs, No Parasternal Heave +ve B.Sounds, Abd Soft, Non tender, No organomegaly appriciated, No rebound - guarding or rigidity. No Cyanosis, Clubbing or edema, No new Rash or bruise     I&Os Trach/ETT -  Data Review   CBC  Recent Labs Lab 08/10/13 0312 08/12/13 0309 08/13/13 0215 08/14/13 0252 08/16/13 0610  WBC 7.0 8.8 5.9 6.1 6.8  HGB 7.6* 7.4* 7.8* 7.7* 8.1*  HCT 28.1* 26.8* 29.0* 28.5* 29.5*  PLT 150 214 219 218 223  MCV 89.5 87.3 89.0 87.2 87.0  MCH 24.2* 24.1* 23.9* 23.5* 23.9*  MCHC 27.0* 27.6* 26.9* 27.0* 27.5*  RDW 18.8* 19.5* 19.4* 18.9* 17.8*    Chemistries   Recent Labs Lab 08/12/13 0309 08/13/13 0215 08/14/13 0252 08/15/13 0405 08/16/13 0610  NA 148* 148* 148* 144 142  K 5.6* 5.5* 4.5 4.7 4.0  CL 103 103 103 98 98  CO2 33* 33* 35* 39* 39*  GLUCOSE 155* 142* 105* 174* 119*  BUN 38* 32* 28* 21 16  CREATININE 1.63* 1.27* 1.12* 0.99 0.89  CALCIUM 9.5 10.1 10.0 9.3 9.1  AST  --   --   --   --  13  ALT  --   --   --   --  44*  ALKPHOS  --   --   --    --  80  BILITOT  --   --   --   --  0.4   ------------------------------------------------------------------------------------------------------------------ CrCl is unknown because both a height and weight (above a minimum accepted value) are required for this calculation. ------------------------------------------------------------------------------------------------------------------ No results found for this basename: HGBA1C,  in the last 72 hours ------------------------------------------------------------------------------------------------------------------ No results found for this basename: CHOL, HDL, LDLCALC, TRIG, CHOLHDL, LDLDIRECT,  in the last 72 hours ------------------------------------------------------------------------------------------------------------------  Recent Labs  08/16/13 0610  TSH 2.500   ------------------------------------------------------------------------------------------------------------------ No results found for this basename: VITAMINB12, FOLATE, FERRITIN, TIBC, IRON, RETICCTPCT,  in the last 72 hours  Coagulation profile No results found for this basename: INR, PROTIME,  in the last 168 hours  No results found for this basename: DDIMER,  in the last 72 hours  Cardiac Enzymes No results found for this basename: CK,  CKMB, TROPONINI, MYOGLOBIN,  in the last 168 hours ------------------------------------------------------------------------------------------------------------------ No components found with this basename: POCBNP,   Micro Results Recent Results (from the past 240 hour(s))  MRSA PCR SCREENING     Status: None   Collection Time    08/08/13  8:44 PM      Result Value Ref Range Status   MRSA by PCR NEGATIVE  NEGATIVE Final   Comment:            The GeneXpert MRSA Assay (FDA     approved for NASAL specimens     only), is one component of a     comprehensive MRSA colonization     surveillance program. It is not     intended to  diagnose MRSA     infection nor to guide or     monitor treatment for     MRSA infections.       Assessment & Plan   Respiratory failure on ATC 40%, continue with weaning trials; chronic trach Abnormal tracheal anatomy status post emergency intubation Obstructive sleep apnea, with pulmonary hypertension, severe, continue with tracheostomy Sarcoidosis continue with prednisone Noncompliance Acute renal failure, result Dysphagia on dysphagia 2 diet, thick liquids Generalized weakness; PT OT to evaluate.   Code Status: Full        DVT Prophylaxis  heparin   Merton Border M.D on 08/16/2013 at 1:38 PM

## 2013-08-17 LAB — URINE CULTURE: Colony Count: 40000

## 2013-08-17 NOTE — Progress Notes (Signed)
[facility]                                                                                              Progress note     Patient Demographics  Melody Gray, is a 43 y.o. female  [id number]  [id number]  DOB - Jul 10, 1970  Admit date - 08/15/2013  Admitting Physician Merton Border, MD  Outpatient Primary MD for the patient is Jerene Pitch, MD  LOS - 2   No chief complaint on file.          Subjective:   Viktorya Shibata feeling okay no chest pains, shortness of breath, nausea, vomiting or diarrhea.  Objective:   Vital signs  Temperature 97 Heart rate 81 Respiratory rate 11 Blood pressure 138/55 Pulse ox 95%    Exam Awake Alert, Oriented X 3, No new F.N deficits, Normal affect [place].AT,PERRAL Supple Neck,No JVD, No cervical lymphadenopathy appriciated.  Tracheostomy midline Symmetrical Chest wall movement, scattered rhonchi, sounds referred from upper airway RRR,No Gallops,Rubs or new Murmurs, No Parasternal Heave +ve B.Sounds, Abd Soft, Non tender, No organomegaly appriciated, No rebound - guarding or rigidity. No Cyanosis, Clubbing or edema, No new Rash or bruise     I&Os 530/300 Trach/ETT -  Data Review   CBC  Recent Labs Lab 08/12/13 0309 08/13/13 0215 08/14/13 0252 08/16/13 0610  WBC 8.8 5.9 6.1 6.8  HGB 7.4* 7.8* 7.7* 8.1*  HCT 26.8* 29.0* 28.5* 29.5*  PLT 214 219 218 223  MCV 87.3 89.0 87.2 87.0  MCH 24.1* 23.9* 23.5* 23.9*  MCHC 27.6* 26.9* 27.0* 27.5*  RDW 19.5* 19.4* 18.9* 17.8*    Chemistries   Recent Labs Lab 08/12/13 0309 08/13/13 0215 08/14/13 0252 08/15/13 0405 08/16/13 0610  NA 148* 148* 148* 144 142  K 5.6* 5.5* 4.5 4.7 4.0  CL 103 103 103 98 98  CO2 33* 33* 35* 39* 39*  GLUCOSE 155* 142* 105* 174* 119*  BUN 38* 32* 28* 21 16  CREATININE 1.63* 1.27* 1.12* 0.99 0.89  CALCIUM 9.5 10.1 10.0 9.3 9.1  AST  --   --   --   --  13  ALT   --   --   --   --  44*  ALKPHOS  --   --   --   --  80  BILITOT  --   --   --   --  0.4   ------------------------------------------------------------------------------------------------------------------ CrCl is unknown because both a height and weight (above a minimum accepted value) are required for this calculation. ------------------------------------------------------------------------------------------------------------------ No results found for this basename: HGBA1C,  in the last 72 hours ------------------------------------------------------------------------------------------------------------------ No results found for this basename: CHOL, HDL, LDLCALC, TRIG, CHOLHDL, LDLDIRECT,  in the last 72 hours ------------------------------------------------------------------------------------------------------------------  Recent Labs  08/16/13 0610  TSH 2.500   ------------------------------------------------------------------------------------------------------------------ No results found for this basename: VITAMINB12, FOLATE, FERRITIN, TIBC, IRON, RETICCTPCT,  in the last 72 hours  Coagulation profile No results found for this basename: INR, PROTIME,  in the last 168 hours  No results found for this basename: DDIMER,  in the last 72 hours  Cardiac Enzymes  No results found for this basename: CK, CKMB, TROPONINI, MYOGLOBIN,  in the last 168 hours ------------------------------------------------------------------------------------------------------------------ No components found with this basename: POCBNP,   Micro Results Recent Results (from the past 240 hour(s))  MRSA PCR SCREENING     Status: None   Collection Time    08/08/13  8:44 PM      Result Value Ref Range Status   MRSA by PCR NEGATIVE  NEGATIVE Final   Comment:            The GeneXpert MRSA Assay (FDA     approved for NASAL specimens     only), is one component of a     comprehensive MRSA colonization      surveillance program. It is not     intended to diagnose MRSA     infection nor to guide or     monitor treatment for     MRSA infections.       Assessment & Plan   Respiratory failure on ATC 40%, continue with weaning trials; chronic trach Abnormal tracheal anatomy status post emergency intubation Obstructive sleep apnea, with pulmonary hypertension, severe, continue with tracheostomy Sarcoidosis continue with prednisone Noncompliance Acute renal failure, result Dysphagia on dysphagia 2 diet, thick liquids Generalized weakness; PT OT to evaluate.  Plan Check labs in a.m.   Code Status: Full        DVT Prophylaxis  heparin   Merton Border M.D on 08/17/2013 at 12:43 PM

## 2013-08-18 ENCOUNTER — Other Ambulatory Visit (HOSPITAL_COMMUNITY): Payer: Medicare Other

## 2013-08-18 DIAGNOSIS — J988 Other specified respiratory disorders: Secondary | ICD-10-CM | POA: Diagnosis not present

## 2013-08-18 DIAGNOSIS — J90 Pleural effusion, not elsewhere classified: Secondary | ICD-10-CM | POA: Diagnosis not present

## 2013-08-18 LAB — CBC
HCT: 28.5 % — ABNORMAL LOW (ref 36.0–46.0)
Hemoglobin: 8 g/dL — ABNORMAL LOW (ref 12.0–15.0)
MCH: 24.5 pg — ABNORMAL LOW (ref 26.0–34.0)
MCHC: 28.1 g/dL — ABNORMAL LOW (ref 30.0–36.0)
MCV: 87.2 fL (ref 78.0–100.0)
Platelets: 219 10*3/uL (ref 150–400)
RBC: 3.27 MIL/uL — ABNORMAL LOW (ref 3.87–5.11)
RDW: 17.4 % — ABNORMAL HIGH (ref 11.5–15.5)
WBC: 7.2 10*3/uL (ref 4.0–10.5)

## 2013-08-18 LAB — BASIC METABOLIC PANEL
BUN: 16 mg/dL (ref 6–23)
CO2: 37 mEq/L — ABNORMAL HIGH (ref 19–32)
Calcium: 9.2 mg/dL (ref 8.4–10.5)
Chloride: 97 mEq/L (ref 96–112)
Creatinine, Ser: 0.79 mg/dL (ref 0.50–1.10)
GFR calc Af Amer: >90 mL/min (ref >90)
GFR calc non Af Amer: >90 mL/min (ref >90)
Glucose, Bld: 137 mg/dL — ABNORMAL HIGH (ref 70–99)
Potassium: 5.2 mEq/L (ref 3.7–5.3)
Sodium: 141 mEq/L (ref 137–147)

## 2013-08-18 NOTE — Progress Notes (Signed)
[facility]                                                                                              Progress note     Patient Demographics  Melody Gray, is a 43 y.o. female  [id number]  [id number]  DOB - August 09, 1970  Admit date - 08/15/2013  Admitting Physician Melody Border, MD  Outpatient Primary MD for the patient is Melody Pitch, MD  LOS - 3   No chief complaint on file.          Subjective:   Melody Gray feeling okay no chest pains, shortness of breath, nausea, vomiting or diarrhea.  Objective:   Vital signs  Temperature 97.9 Heart rate 63 Respiratory rate 11 Blood pressure 137/60 Pulse ox 98%    Exam Awake Alert, Oriented X 3, No new F.N deficits, Normal affect [place].AT,PERRAL Supple Neck,No JVD, No cervical lymphadenopathy appriciated.  Tracheostomy midline Symmetrical Chest wall movement, scattered rhonchi, sounds referred from upper airway RRR,No Gallops,Rubs or new Murmurs, No Parasternal Heave +ve B.Sounds, Abd Soft, Non tender, No organomegaly appriciated, No rebound - guarding or rigidity. No Cyanosis, Clubbing or edema, No new Rash or bruise     I&Os 970/??  Trach/ETT -  Data Review   CBC  Recent Labs Lab 08/12/13 0309 08/13/13 0215 08/14/13 0252 08/16/13 0610 08/18/13 0510  WBC 8.8 5.9 6.1 6.8 7.2  HGB 7.4* 7.8* 7.7* 8.1* 8.0*  HCT 26.8* 29.0* 28.5* 29.5* 28.5*  PLT 214 219 218 223 219  MCV 87.3 89.0 87.2 87.0 87.2  MCH 24.1* 23.9* 23.5* 23.9* 24.5*  MCHC 27.6* 26.9* 27.0* 27.5* 28.1*  RDW 19.5* 19.4* 18.9* 17.8* 17.4*    Chemistries   Recent Labs Lab 08/13/13 0215 08/14/13 0252 08/15/13 0405 08/16/13 0610 08/18/13 0510  NA 148* 148* 144 142 141  K 5.5* 4.5 4.7 4.0 5.2  CL 103 103 98 98 97  CO2 33* 35* 39* 39* 37*  GLUCOSE 142* 105* 174* 119* 137*  BUN 32* 28* 21 16 16   CREATININE 1.27* 1.12* 0.99 0.89 0.79  CALCIUM  10.1 10.0 9.3 9.1 9.2  AST  --   --   --  13  --   ALT  --   --   --  44*  --   ALKPHOS  --   --   --  80  --   BILITOT  --   --   --  0.4  --    ------------------------------------------------------------------------------------------------------------------ CrCl is unknown because both a height and weight (above a minimum accepted value) are required for this calculation. ------------------------------------------------------------------------------------------------------------------ No results found for this basename: HGBA1C,  in the last 72 hours ------------------------------------------------------------------------------------------------------------------ No results found for this basename: CHOL, HDL, LDLCALC, TRIG, CHOLHDL, LDLDIRECT,  in the last 72 hours ------------------------------------------------------------------------------------------------------------------  Recent Labs  08/16/13 0610  TSH 2.500   ------------------------------------------------------------------------------------------------------------------ No results found for this basename: VITAMINB12, FOLATE, FERRITIN, TIBC, IRON, RETICCTPCT,  in the last 72 hours  Coagulation profile No results found for this basename: INR, PROTIME,  in the last 168 hours  No results found for this  basename: DDIMER,  in the last 72 hours  Cardiac Enzymes No results found for this basename: CK, CKMB, TROPONINI, MYOGLOBIN,  in the last 168 hours ------------------------------------------------------------------------------------------------------------------ No components found with this basename: POCBNP,   Micro Results Recent Results (from the past 240 hour(s))  MRSA PCR SCREENING     Status: None   Collection Time    08/08/13  8:44 PM      Result Value Ref Range Status   MRSA by PCR NEGATIVE  NEGATIVE Final   Comment:            The GeneXpert MRSA Assay (FDA     approved for NASAL specimens     only), is one  component of a     comprehensive MRSA colonization     surveillance program. It is not     intended to diagnose MRSA     infection nor to guide or     monitor treatment for     MRSA infections.  URINE CULTURE     Status: None   Collection Time    08/16/13  1:28 PM      Result Value Ref Range Status   Specimen Description URINE, RANDOM   Final   Special Requests NONE   Final   Culture  Setup Time     Final   Value: 08/16/2013 17:42     Performed at [place]     Final   Value: 40,000 COLONIES/ML     Performed at [facility]   Culture     Final   Value: Multiple bacterial morphotypes present, none predominant. Suggest appropriate recollection if clinically indicated.     Performed at [facility]   Report Status 08/17/2013 FINAL   Final       Assessment & Plan   Respiratory failure on ATC 40%, continue with weaning trials; chronic trach, changed to a #6 cuff less Abnormal tracheal anatomy status post emergency intubation Obstructive sleep apnea, with pulmonary hypertension, severe, continue with tracheostomy Sarcoidosis continue with prednisone Noncompliance Acute renal failure, result Dysphagia on dysphagia 2 diet, thick liquids Generalized weakness; PT OT to evaluate. Hyperkalemia Plan Kayexalate Changed tracheostomy to #6 cuff less   Code Status: Full        DVT Prophylaxis  heparin   Melody Gray M.D on 08/18/2013 at 2:09 PM

## 2013-08-19 LAB — POTASSIUM: Potassium: 4.2 mEq/L (ref 3.7–5.3)

## 2013-08-19 NOTE — Progress Notes (Signed)
[facility]                                                                                              Progress note     Patient Demographics  Melody Gray, is a 43 y.o. female  [id number]  [id number]  DOB - Jan 21, 1971  Admit date - 08/15/2013  Admitting Physician Merton Border, MD  Outpatient Primary MD for the patient is Jerene Pitch, MD  LOS - 4   No chief complaint on file.          Subjective:   [name] feeling okay no chest pains, shortness of breath, nausea, vomiting or diarrhea.  Objective:   Vital signs  Temperature 96.8 Heart rate 84 Respiratory rate 20 Blood pressure 142/70 Pulse ox 98%    Exam Awake Alert, Oriented X 3, No new F.N deficits, Normal affect [place].AT,PERRAL Supple Neck,No JVD, No cervical lymphadenopathy appriciated.  Tracheostomy midline Symmetrical Chest wall movement, scattered rhonchi, sounds referred from upper airway RRR,No Gallops,Rubs or new Murmurs, No Parasternal Heave +ve B.Sounds, Abd Soft, Non tender, No organomegaly appriciated, No rebound - guarding or rigidity. No Cyanosis, Clubbing or edema, No new Rash or bruise     I&Os 1210/??  Trach -6 cuff less  Data Review   CBC  Recent Labs Lab 08/13/13 0215 08/14/13 0252 08/16/13 0610 08/18/13 0510  WBC 5.9 6.1 6.8 7.2  HGB 7.8* 7.7* 8.1* 8.0*  HCT 29.0* 28.5* 29.5* 28.5*  PLT 219 218 223 219  MCV 89.0 87.2 87.0 87.2  MCH 23.9* 23.5* 23.9* 24.5*  MCHC 26.9* 27.0* 27.5* 28.1*  RDW 19.4* 18.9* 17.8* 17.4*    Chemistries   Recent Labs Lab 08/13/13 0215 08/14/13 0252 08/15/13 0405 08/16/13 0610 08/18/13 0510 08/19/13 0630  NA 148* 148* 144 142 141  --   K 5.5* 4.5 4.7 4.0 5.2 4.2  CL 103 103 98 98 97  --   CO2 33* 35* 39* 39* 37*  --   GLUCOSE 142* 105* 174* 119* 137*  --   BUN 32* 28* 21 16 16   --   CREATININE 1.27* 1.12* 0.99 0.89 0.79  --   CALCIUM  10.1 10.0 9.3 9.1 9.2  --   AST  --   --   --  13  --   --   ALT  --   --   --  44*  --   --   ALKPHOS  --   --   --  80  --   --   BILITOT  --   --   --  0.4  --   --    ------------------------------------------------------------------------------------------------------------------ CrCl is unknown because both a height and weight (above a minimum accepted value) are required for this calculation. ------------------------------------------------------------------------------------------------------------------ No results found for this basename: HGBA1C,  in the last 72 hours ------------------------------------------------------------------------------------------------------------------ No results found for this basename: CHOL, HDL, LDLCALC, TRIG, CHOLHDL, LDLDIRECT,  in the last 72 hours ------------------------------------------------------------------------------------------------------------------ No results found for this basename: TSH, T4TOTAL, FREET3, T3FREE, THYROIDAB,  in the last 72 hours ------------------------------------------------------------------------------------------------------------------ No results found for this basename: VITAMINB12,  FOLATE, FERRITIN, TIBC, IRON, RETICCTPCT,  in the last 72 hours  Coagulation profile No results found for this basename: INR, PROTIME,  in the last 168 hours  No results found for this basename: DDIMER,  in the last 72 hours  Cardiac Enzymes No results found for this basename: CK, CKMB, TROPONINI, MYOGLOBIN,  in the last 168 hours ------------------------------------------------------------------------------------------------------------------ No components found with this basename: POCBNP,   Micro Results Recent Results (from the past 240 hour(s))  URINE CULTURE     Status: None   Collection Time    08/16/13  1:28 PM      Result Value Ref Range Status   Specimen Description URINE, RANDOM   Final   Special Requests NONE    Final   Culture  Setup Time     Final   Value: 08/16/2013 17:42     Performed at [place]     Final   Value: 40,000 COLONIES/ML     Performed at [facility]   Culture     Final   Value: Multiple bacterial morphotypes present, none predominant. Suggest appropriate recollection if clinically indicated.     Performed at [facility]   Report Status 08/17/2013 FINAL   Final       Assessment & Plan   Respiratory failure on ATC 40%, continue with weaning trials; chronic trach, changed to a #6 cuff less     Abnormal tracheal anatomy status post emergency intubation. Not a decannulation candidate Obstructive sleep apnea, with pulmonary hypertension, severe, continue with tracheostomy Sarcoidosis continue with prednisone Noncompliance Acute renal failure, result Dysphagia on dysphagia 2 diet, thick liquids Generalized weakness; PT OT to evaluate. Hyperkalemia Plan Continue same treatment   Code Status: Full        DVT Prophylaxis  heparin   Merton Border M.D on 08/19/2013 at 2:32 PM

## 2013-08-20 NOTE — Progress Notes (Signed)
[facility]                                                                                              Progress note     Patient Demographics  Melody Gray, is a 43 y.o. female  [id number]  [id number]  DOB - 10-25-1970  Admit date - 08/15/2013  Admitting Physician Merton Border, MD  Outpatient Primary MD for the patient is Jerene Pitch, MD  LOS - 5   Chief complaint    Respiratory failure       Abnormal ENT anatomy with chronic trach      Subjective:   Melody Gray feeling okay no chest pains, shortness of breath, nausea, vomiting or diarrhea.  Objective:   Vital signs  Temperature 99 Heart rate 64 Respiratory rate 18 Blood pressure 154/80 Pulse ox 99%    Exam Awake Alert, Oriented X 3, No new F.N deficits, Normal affect [place].AT,PERRAL Supple Neck,No JVD, No cervical lymphadenopathy appriciated.  Tracheostomy midline Symmetrical Chest wall movement, scattered rhonchi, sounds referred from upper airway RRR,No Gallops,Rubs or new Murmurs, No Parasternal Heave +ve B.Sounds, Abd Soft, Non tender, No organomegaly appriciated, No rebound - guarding or rigidity. No Cyanosis, Clubbing or edema, No new Rash or bruise     I&Os unknown  Trach -6 cuff less  Data Review   CBC  Recent Labs Lab 08/14/13 0252 08/16/13 0610 08/18/13 0510  WBC 6.1 6.8 7.2  HGB 7.7* 8.1* 8.0*  HCT 28.5* 29.5* 28.5*  PLT 218 223 219  MCV 87.2 87.0 87.2  MCH 23.5* 23.9* 24.5*  MCHC 27.0* 27.5* 28.1*  RDW 18.9* 17.8* 17.4*    Chemistries   Recent Labs Lab 08/14/13 0252 08/15/13 0405 08/16/13 0610 08/18/13 0510 08/19/13 0630  NA 148* 144 142 141  --   K 4.5 4.7 4.0 5.2 4.2  CL 103 98 98 97  --   CO2 35* 39* 39* 37*  --   GLUCOSE 105* 174* 119* 137*  --   BUN 28* 21 16 16   --   CREATININE 1.12* 0.99 0.89 0.79  --   CALCIUM 10.0 9.3 9.1 9.2  --   AST  --   --  13  --   --    ALT  --   --  44*  --   --   ALKPHOS  --   --  80  --   --   BILITOT  --   --  0.4  --   --    ------------------------------------------------------------------------------------------------------------------ CrCl is unknown because both a height and weight (above a minimum accepted value) are required for this calculation. ------------------------------------------------------------------------------------------------------------------ No results found for this basename: HGBA1C,  in the last 72 hours ------------------------------------------------------------------------------------------------------------------ No results found for this basename: CHOL, HDL, LDLCALC, TRIG, CHOLHDL, LDLDIRECT,  in the last 72 hours ------------------------------------------------------------------------------------------------------------------ No results found for this basename: TSH, T4TOTAL, FREET3, T3FREE, THYROIDAB,  in the last 72 hours ------------------------------------------------------------------------------------------------------------------ No results found for this basename: VITAMINB12, FOLATE, FERRITIN, TIBC, IRON, RETICCTPCT,  in the last 72 hours  Coagulation profile No results found for this basename: INR, PROTIME,  in the last 168 hours  No results found for this basename: DDIMER,  in the last 72 hours  Cardiac Enzymes No results found for this basename: CK, CKMB, TROPONINI, MYOGLOBIN,  in the last 168 hours ------------------------------------------------------------------------------------------------------------------ No components found with this basename: POCBNP,   Micro Results Recent Results (from the past 240 hour(s))  URINE CULTURE     Status: None   Collection Time    08/16/13  1:28 PM      Result Value Ref Range Status   Specimen Description URINE, RANDOM   Final   Special Requests NONE   Final   Culture  Setup Time     Final   Value: 08/16/2013 17:42      Performed at [place]     Final   Value: 40,000 COLONIES/ML     Performed at [facility]   Culture     Final   Value: Multiple bacterial morphotypes present, none predominant. Suggest appropriate recollection if clinically indicated.     Performed at [facility]   Report Status 08/17/2013 FINAL   Final       Assessment & Plan   Respiratory failure on ATC  20%, chronic trach, changed to a #6 cuff less     Abnormal tracheal anatomy status post emergency intubation. Not a decannulation candidate Obstructive sleep apnea, with pulmonary hypertension, severe, continue with tracheostomy Sarcoidosis continue with prednisone Noncompliance Acute renal failure, result Dysphagia on dysphagia 1 diet, Nectar thick liquids Generalized weakness; PT OT as tolerated Hyperkalemia  Plan  DC Solu-Medrol Prednisone by mouth Check labs in a.m.   Code Status: Full        DVT Prophylaxis  heparin   Merton Border M.D on 08/20/2013 at 2:40 PM

## 2013-08-21 LAB — BASIC METABOLIC PANEL
BUN: 17 mg/dL (ref 6–23)
CO2: 34 mEq/L — ABNORMAL HIGH (ref 19–32)
Calcium: 9.6 mg/dL (ref 8.4–10.5)
Chloride: 95 mEq/L — ABNORMAL LOW (ref 96–112)
Creatinine, Ser: 0.74 mg/dL (ref 0.50–1.10)
GFR calc Af Amer: >90 mL/min (ref >90)
GFR calc non Af Amer: >90 mL/min (ref >90)
Glucose, Bld: 138 mg/dL — ABNORMAL HIGH (ref 70–99)
Potassium: 4.2 mEq/L (ref 3.7–5.3)
Sodium: 140 mEq/L (ref 137–147)

## 2013-08-21 LAB — CBC
HCT: 30.4 % — ABNORMAL LOW (ref 36.0–46.0)
Hemoglobin: 8.8 g/dL — ABNORMAL LOW (ref 12.0–15.0)
MCH: 24.2 pg — ABNORMAL LOW (ref 26.0–34.0)
MCHC: 28.9 g/dL — ABNORMAL LOW (ref 30.0–36.0)
MCV: 83.5 fL (ref 78.0–100.0)
Platelets: 254 10*3/uL (ref 150–400)
RBC: 3.64 MIL/uL — ABNORMAL LOW (ref 3.87–5.11)
RDW: 17.7 % — ABNORMAL HIGH (ref 11.5–15.5)
WBC: 11.4 10*3/uL — ABNORMAL HIGH (ref 4.0–10.5)

## 2013-08-22 LAB — CBC WITH DIFFERENTIAL/PLATELET
Basophils Absolute: 0 10*3/uL (ref 0.0–0.1)
Basophils Relative: 0 % (ref 0–1)
Eosinophils Absolute: 0.1 10*3/uL (ref 0.0–0.7)
Eosinophils Relative: 1 % (ref 0–5)
HCT: 28.7 % — ABNORMAL LOW (ref 36.0–46.0)
Hemoglobin: 8.2 g/dL — ABNORMAL LOW (ref 12.0–15.0)
Lymphocytes Relative: 32 % (ref 12–46)
Lymphs Abs: 2.5 10*3/uL (ref 0.7–4.0)
MCH: 23.8 pg — ABNORMAL LOW (ref 26.0–34.0)
MCHC: 28.6 g/dL — ABNORMAL LOW (ref 30.0–36.0)
MCV: 83.2 fL (ref 78.0–100.0)
Monocytes Absolute: 0.8 10*3/uL (ref 0.1–1.0)
Monocytes Relative: 10 % (ref 3–12)
Neutro Abs: 4.6 10*3/uL (ref 1.7–7.7)
Neutrophils Relative %: 57 % (ref 43–77)
Platelets: 262 10*3/uL (ref 150–400)
RBC: 3.45 MIL/uL — ABNORMAL LOW (ref 3.87–5.11)
RDW: 17.8 % — ABNORMAL HIGH (ref 11.5–15.5)
WBC: 8 10*3/uL (ref 4.0–10.5)

## 2013-08-22 NOTE — Consult Note (Unsigned)
NAME:  [name], [name]                     ACCOUNT NO.:  MEDICAL RECORD NO.:  [id number]  LOCATION:                                 FACILITY:  PHYSICIAN:  Leonides Sake. Lucia Gaskins, M.D.DATE OF BIRTH:  1971-04-20  DATE OF CONSULTATION:  08/21/2013 DATE OF DISCHARGE:                                CONSULTATION   REASON FOR CONSULT:  Evaluate the patient with bleeding from the trach site.  BRIEF HISTORY:  Melody Gray is a 43 year old obese black female who is status post tracheostomy little over a week ago.  Her trach was changed from a #6 cuffed trach to a #6 cuffless trach two days ago.  She started bleeding around the trach site last night, had persistent bleeding throughout the morning, and I was consulted to evaluate and control the bleeding around the trach site.  On exam, she has a small ooze of bleeding actively from around the trach site, large amount of blood clot around the trach site.  The [place]iley trach was removed and a #6 endotracheal tube was inserted and the cuff was inflated.  Blood clot was removed around the trach site.  Bright red bleeding was observed from the left side of the deep trach wound.  This was controlled with silver nitrate adequately, but no further bleeding and then the wound was packed with some Surgicel.  There was no further bleeding noted.  The #6 cuffless tube was reinserted.  [name] was functioning well.  We will follow up p.r.n.          ______________________________ Leonides Sake. Lucia Gaskins, M.D.     CEN/MEDQ  D:  08/21/2013  T:  08/21/2013  Job:  [id number]

## 2013-08-23 LAB — BASIC METABOLIC PANEL
BUN: 17 mg/dL (ref 6–23)
CO2: 33 mEq/L — ABNORMAL HIGH (ref 19–32)
Calcium: 9.4 mg/dL (ref 8.4–10.5)
Chloride: 100 mEq/L (ref 96–112)
Creatinine, Ser: 0.79 mg/dL (ref 0.50–1.10)
GFR calc Af Amer: >90 mL/min (ref >90)
GFR calc non Af Amer: >90 mL/min (ref >90)
Glucose, Bld: 146 mg/dL — ABNORMAL HIGH (ref 70–99)
Potassium: 4 mEq/L (ref 3.7–5.3)
Sodium: 143 mEq/L (ref 137–147)

## 2013-08-23 LAB — CBC
HCT: 29 % — ABNORMAL LOW (ref 36.0–46.0)
Hemoglobin: 8.2 g/dL — ABNORMAL LOW (ref 12.0–15.0)
MCH: 24.2 pg — ABNORMAL LOW (ref 26.0–34.0)
MCHC: 28.3 g/dL — ABNORMAL LOW (ref 30.0–36.0)
MCV: 85.5 fL (ref 78.0–100.0)
Platelets: 258 10*3/uL (ref 150–400)
RBC: 3.39 MIL/uL — ABNORMAL LOW (ref 3.87–5.11)
RDW: 17.7 % — ABNORMAL HIGH (ref 11.5–15.5)
WBC: 9.2 10*3/uL (ref 4.0–10.5)

## 2013-08-24 LAB — CBC WITH DIFFERENTIAL/PLATELET
Basophils Absolute: 0 10*3/uL (ref 0.0–0.1)
Basophils Relative: 0 % (ref 0–1)
Eosinophils Absolute: 0.1 10*3/uL (ref 0.0–0.7)
Eosinophils Relative: 2 % (ref 0–5)
HCT: 28.3 % — ABNORMAL LOW (ref 36.0–46.0)
Hemoglobin: 8.1 g/dL — ABNORMAL LOW (ref 12.0–15.0)
Lymphocytes Relative: 28 % (ref 12–46)
Lymphs Abs: 2.3 10*3/uL (ref 0.7–4.0)
MCH: 24.6 pg — ABNORMAL LOW (ref 26.0–34.0)
MCHC: 28.6 g/dL — ABNORMAL LOW (ref 30.0–36.0)
MCV: 86 fL (ref 78.0–100.0)
Monocytes Absolute: 0.6 10*3/uL (ref 0.1–1.0)
Monocytes Relative: 8 % (ref 3–12)
Neutro Abs: 5.1 10*3/uL (ref 1.7–7.7)
Neutrophils Relative %: 62 % (ref 43–77)
Platelets: 237 10*3/uL (ref 150–400)
RBC: 3.29 MIL/uL — ABNORMAL LOW (ref 3.87–5.11)
RDW: 17.8 % — ABNORMAL HIGH (ref 11.5–15.5)
WBC: 8.2 10*3/uL (ref 4.0–10.5)

## 2013-08-25 LAB — BASIC METABOLIC PANEL
BUN: 18 mg/dL (ref 6–23)
CO2: 35 mEq/L — ABNORMAL HIGH (ref 19–32)
Calcium: 9.1 mg/dL (ref 8.4–10.5)
Chloride: 97 mEq/L (ref 96–112)
Creatinine, Ser: 0.77 mg/dL (ref 0.50–1.10)
GFR calc Af Amer: >90 mL/min (ref >90)
GFR calc non Af Amer: >90 mL/min (ref >90)
Glucose, Bld: 136 mg/dL — ABNORMAL HIGH (ref 70–99)
Potassium: 4 mEq/L (ref 3.7–5.3)
Sodium: 140 mEq/L (ref 137–147)

## 2013-08-25 LAB — CBC WITH DIFFERENTIAL/PLATELET
Basophils Absolute: 0 10*3/uL (ref 0.0–0.1)
Basophils Relative: 0 % (ref 0–1)
Eosinophils Absolute: 0.1 10*3/uL (ref 0.0–0.7)
Eosinophils Relative: 1 % (ref 0–5)
HCT: 27.8 % — ABNORMAL LOW (ref 36.0–46.0)
Hemoglobin: 7.9 g/dL — ABNORMAL LOW (ref 12.0–15.0)
Lymphocytes Relative: 26 % (ref 12–46)
Lymphs Abs: 2.1 10*3/uL (ref 0.7–4.0)
MCH: 24.4 pg — ABNORMAL LOW (ref 26.0–34.0)
MCHC: 28.4 g/dL — ABNORMAL LOW (ref 30.0–36.0)
MCV: 85.8 fL (ref 78.0–100.0)
Monocytes Absolute: 0.7 10*3/uL (ref 0.1–1.0)
Monocytes Relative: 8 % (ref 3–12)
Neutro Abs: 5.4 10*3/uL (ref 1.7–7.7)
Neutrophils Relative %: 65 % (ref 43–77)
Platelets: 243 10*3/uL (ref 150–400)
RBC: 3.24 MIL/uL — ABNORMAL LOW (ref 3.87–5.11)
RDW: 17.7 % — ABNORMAL HIGH (ref 11.5–15.5)
WBC: 8.2 10*3/uL (ref 4.0–10.5)

## 2013-08-25 LAB — PREALBUMIN: Prealbumin: 27 mg/dL (ref 17.0–34.0)

## 2013-08-25 LAB — MAGNESIUM: Magnesium: 1.5 mg/dL (ref 1.5–2.5)

## 2013-08-25 LAB — PRO B NATRIURETIC PEPTIDE: Pro B Natriuretic peptide (BNP): 827.1 pg/mL — ABNORMAL HIGH (ref 0–125)

## 2013-08-25 LAB — PHOSPHORUS: Phosphorus: 3.3 mg/dL (ref 2.3–4.6)

## 2013-08-27 LAB — BASIC METABOLIC PANEL
BUN: 17 mg/dL (ref 6–23)
CO2: 35 mEq/L — ABNORMAL HIGH (ref 19–32)
Calcium: 9.1 mg/dL (ref 8.4–10.5)
Chloride: 100 mEq/L (ref 96–112)
Creatinine, Ser: 0.73 mg/dL (ref 0.50–1.10)
GFR calc Af Amer: >90 mL/min (ref >90)
GFR calc non Af Amer: >90 mL/min (ref >90)
Glucose, Bld: 149 mg/dL — ABNORMAL HIGH (ref 70–99)
Potassium: 4.3 mEq/L (ref 3.7–5.3)
Sodium: 143 mEq/L (ref 137–147)

## 2013-08-27 LAB — CBC
HCT: 26.1 % — ABNORMAL LOW (ref 36.0–46.0)
Hemoglobin: 7.5 g/dL — ABNORMAL LOW (ref 12.0–15.0)
MCH: 24.3 pg — ABNORMAL LOW (ref 26.0–34.0)
MCHC: 28.7 g/dL — ABNORMAL LOW (ref 30.0–36.0)
MCV: 84.5 fL (ref 78.0–100.0)
Platelets: 218 10*3/uL (ref 150–400)
RBC: 3.09 MIL/uL — ABNORMAL LOW (ref 3.87–5.11)
RDW: 17.5 % — ABNORMAL HIGH (ref 11.5–15.5)
WBC: 8 10*3/uL (ref 4.0–10.5)

## 2013-08-27 LAB — PREPARE RBC (CROSSMATCH)

## 2013-08-28 LAB — CBC
HCT: 29.1 % — ABNORMAL LOW (ref 36.0–46.0)
Hemoglobin: 8.2 g/dL — ABNORMAL LOW (ref 12.0–15.0)
MCH: 24.5 pg — ABNORMAL LOW (ref 26.0–34.0)
MCHC: 28.2 g/dL — ABNORMAL LOW (ref 30.0–36.0)
MCV: 86.9 fL (ref 78.0–100.0)
Platelets: 222 10*3/uL (ref 150–400)
RBC: 3.35 MIL/uL — ABNORMAL LOW (ref 3.87–5.11)
RDW: 17.9 % — ABNORMAL HIGH (ref 11.5–15.5)
WBC: 6.3 10*3/uL (ref 4.0–10.5)

## 2013-08-30 LAB — CBC
HCT: 26.9 % — ABNORMAL LOW (ref 36.0–46.0)
Hemoglobin: 7.7 g/dL — ABNORMAL LOW (ref 12.0–15.0)
MCH: 24.4 pg — ABNORMAL LOW (ref 26.0–34.0)
MCHC: 28.6 g/dL — ABNORMAL LOW (ref 30.0–36.0)
MCV: 85.1 fL (ref 78.0–100.0)
Platelets: 223 10*3/uL (ref 150–400)
RBC: 3.16 MIL/uL — ABNORMAL LOW (ref 3.87–5.11)
RDW: 17.6 % — ABNORMAL HIGH (ref 11.5–15.5)
WBC: 8.9 10*3/uL (ref 4.0–10.5)

## 2013-08-30 LAB — BASIC METABOLIC PANEL
BUN: 17 mg/dL (ref 6–23)
CO2: 36 mEq/L — ABNORMAL HIGH (ref 19–32)
Calcium: 9.4 mg/dL (ref 8.4–10.5)
Chloride: 99 mEq/L (ref 96–112)
Creatinine, Ser: 0.79 mg/dL (ref 0.50–1.10)
GFR calc Af Amer: >90 mL/min (ref >90)
GFR calc non Af Amer: >90 mL/min (ref >90)
Glucose, Bld: 153 mg/dL — ABNORMAL HIGH (ref 70–99)
Potassium: 4.4 mEq/L (ref 3.7–5.3)
Sodium: 142 mEq/L (ref 137–147)

## 2013-08-30 LAB — HEMOGLOBIN A1C
Hgb A1c MFr Bld: 8.1 % — ABNORMAL HIGH (ref <5.7)
Mean Plasma Glucose: 186 mg/dL — ABNORMAL HIGH (ref <117)

## 2013-08-31 LAB — TYPE AND SCREEN
ABO/RH(D): A POS
Antibody Screen: NEGATIVE
Unit division: 0
Unit division: 0

## 2013-09-01 LAB — COMPREHENSIVE METABOLIC PANEL
ALT: 16 U/L (ref 0–35)
AST: 10 U/L (ref 0–37)
Albumin: 3 g/dL — ABNORMAL LOW (ref 3.5–5.2)
Alkaline Phosphatase: 99 U/L (ref 39–117)
BUN: 17 mg/dL (ref 6–23)
CO2: 35 mEq/L — ABNORMAL HIGH (ref 19–32)
Calcium: 9.6 mg/dL (ref 8.4–10.5)
Chloride: 96 mEq/L (ref 96–112)
Creatinine, Ser: 0.7 mg/dL (ref 0.50–1.10)
GFR calc Af Amer: >90 mL/min (ref >90)
GFR calc non Af Amer: >90 mL/min (ref >90)
Glucose, Bld: 115 mg/dL — ABNORMAL HIGH (ref 70–99)
Potassium: 3.9 mEq/L (ref 3.7–5.3)
Sodium: 140 mEq/L (ref 137–147)
Total Bilirubin: 0.3 mg/dL (ref 0.3–1.2)
Total Protein: 6.8 g/dL (ref 6.0–8.3)

## 2013-09-01 LAB — CBC
HCT: 30.5 % — ABNORMAL LOW (ref 36.0–46.0)
Hemoglobin: 8.7 g/dL — ABNORMAL LOW (ref 12.0–15.0)
MCH: 24.1 pg — ABNORMAL LOW (ref 26.0–34.0)
MCHC: 28.5 g/dL — ABNORMAL LOW (ref 30.0–36.0)
MCV: 84.5 fL (ref 78.0–100.0)
Platelets: 233 10*3/uL (ref 150–400)
RBC: 3.61 MIL/uL — ABNORMAL LOW (ref 3.87–5.11)
RDW: 17.6 % — ABNORMAL HIGH (ref 11.5–15.5)
WBC: 10.1 10*3/uL (ref 4.0–10.5)

## 2013-09-01 LAB — PREALBUMIN: Prealbumin: 32 mg/dL (ref 17.0–34.0)

## 2013-09-02 LAB — POTASSIUM: Potassium: 4.2 mEq/L (ref 3.7–5.3)

## 2013-09-03 LAB — BASIC METABOLIC PANEL
BUN: 18 mg/dL (ref 6–23)
CO2: 34 mEq/L — ABNORMAL HIGH (ref 19–32)
Calcium: 8.9 mg/dL (ref 8.4–10.5)
Chloride: 98 mEq/L (ref 96–112)
Creatinine, Ser: 0.75 mg/dL (ref 0.50–1.10)
GFR calc Af Amer: >90 mL/min (ref >90)
GFR calc non Af Amer: >90 mL/min (ref >90)
Glucose, Bld: 106 mg/dL — ABNORMAL HIGH (ref 70–99)
Potassium: 4 mEq/L (ref 3.7–5.3)
Sodium: 141 mEq/L (ref 137–147)

## 2013-09-04 DIAGNOSIS — I509 Heart failure, unspecified: Secondary | ICD-10-CM | POA: Diagnosis not present

## 2013-09-04 DIAGNOSIS — I5033 Acute on chronic diastolic (congestive) heart failure: Secondary | ICD-10-CM | POA: Diagnosis not present

## 2013-09-04 DIAGNOSIS — N189 Chronic kidney disease, unspecified: Secondary | ICD-10-CM | POA: Diagnosis not present

## 2013-09-04 DIAGNOSIS — D869 Sarcoidosis, unspecified: Secondary | ICD-10-CM | POA: Diagnosis not present

## 2013-09-04 DIAGNOSIS — Z430 Encounter for attention to tracheostomy: Secondary | ICD-10-CM | POA: Diagnosis not present

## 2013-09-04 DIAGNOSIS — IMO0001 Reserved for inherently not codable concepts without codable children: Secondary | ICD-10-CM | POA: Diagnosis not present

## 2013-09-04 DIAGNOSIS — G4730 Sleep apnea, unspecified: Secondary | ICD-10-CM | POA: Diagnosis not present

## 2013-09-04 DIAGNOSIS — Z9981 Dependence on supplemental oxygen: Secondary | ICD-10-CM | POA: Diagnosis not present

## 2013-09-04 DIAGNOSIS — I2789 Other specified pulmonary heart diseases: Secondary | ICD-10-CM | POA: Diagnosis not present

## 2013-09-04 DIAGNOSIS — F17200 Nicotine dependence, unspecified, uncomplicated: Secondary | ICD-10-CM | POA: Diagnosis not present

## 2013-09-04 DIAGNOSIS — J99 Respiratory disorders in diseases classified elsewhere: Secondary | ICD-10-CM | POA: Diagnosis not present

## 2013-09-05 DIAGNOSIS — D869 Sarcoidosis, unspecified: Secondary | ICD-10-CM | POA: Diagnosis not present

## 2013-09-05 DIAGNOSIS — Z430 Encounter for attention to tracheostomy: Secondary | ICD-10-CM | POA: Diagnosis not present

## 2013-09-05 DIAGNOSIS — J99 Respiratory disorders in diseases classified elsewhere: Secondary | ICD-10-CM | POA: Diagnosis not present

## 2013-09-05 DIAGNOSIS — IMO0001 Reserved for inherently not codable concepts without codable children: Secondary | ICD-10-CM | POA: Diagnosis not present

## 2013-09-05 DIAGNOSIS — I509 Heart failure, unspecified: Secondary | ICD-10-CM | POA: Diagnosis not present

## 2013-09-05 DIAGNOSIS — I5033 Acute on chronic diastolic (congestive) heart failure: Secondary | ICD-10-CM | POA: Diagnosis not present

## 2013-09-09 ENCOUNTER — Encounter: Payer: Self-pay | Admitting: Internal Medicine

## 2013-09-09 ENCOUNTER — Ambulatory Visit (INDEPENDENT_AMBULATORY_CARE_PROVIDER_SITE_OTHER): Payer: Medicare Other | Admitting: Internal Medicine

## 2013-09-09 VITALS — BP 145/85 | HR 83 | Temp 97.2°F | Ht 61.0 in | Wt 238.8 lb

## 2013-09-09 DIAGNOSIS — R109 Unspecified abdominal pain: Secondary | ICD-10-CM | POA: Diagnosis not present

## 2013-09-09 DIAGNOSIS — J9610 Chronic respiratory failure, unspecified whether with hypoxia or hypercapnia: Secondary | ICD-10-CM | POA: Diagnosis not present

## 2013-09-09 DIAGNOSIS — D869 Sarcoidosis, unspecified: Secondary | ICD-10-CM | POA: Diagnosis not present

## 2013-09-09 DIAGNOSIS — I2789 Other specified pulmonary heart diseases: Secondary | ICD-10-CM | POA: Diagnosis not present

## 2013-09-09 DIAGNOSIS — E119 Type 2 diabetes mellitus without complications: Secondary | ICD-10-CM

## 2013-09-09 DIAGNOSIS — I10 Essential (primary) hypertension: Secondary | ICD-10-CM

## 2013-09-09 DIAGNOSIS — I509 Heart failure, unspecified: Secondary | ICD-10-CM | POA: Diagnosis not present

## 2013-09-09 DIAGNOSIS — R040 Epistaxis: Secondary | ICD-10-CM | POA: Diagnosis not present

## 2013-09-09 DIAGNOSIS — F17200 Nicotine dependence, unspecified, uncomplicated: Secondary | ICD-10-CM | POA: Diagnosis not present

## 2013-09-09 MED ORDER — FUROSEMIDE 40 MG PO TABS: 40.0000 mg | ORAL_TABLET | Freq: Every day | ORAL | Status: DC

## 2013-09-09 NOTE — Progress Notes (Signed)
Subjective:   Patient ID: Melody Gray female   DOB: [birth year] 43 y.o.   MRN: [id number]  HPI: MelodyMelody Gray is a 43 y.o. African American female with PMH of Sarcoidosis O2 dependent, chronic tobacco dependence, morbid obesity, DM2 well controlled, HTN, and NICM presenting to [facility] today for hospital follow up visit.  She was discharged from the hospital on 08/15/13 to [facility] and subsequently discharged from there as well last week.  Her recent hospital admission was for acute on chronic hypercarbic respiratory failure that resulted in emergent tracheostomy with a very difficult airway.  She remains on a prednisone taper for total of 6 weeks that should end by the end of May, she is currently on 10mg  daily since discharge from [facility].   She has not seen pulmonary, cardiology, or ENT yet since discharge but has an upcoming appointment with ENT on 5/21.  Since discharge, Melody Gray reports feeling much better, cooking, and able to eat.  She is able to talk but her voice is not back to baseline. She is doing self trach care and reports no more bleeding from site but does have drainage that she has to clean. However, her main complaint today is trouble sleeping at night due to shortness of breath and needing a hospital bed. She says she needs to sleep upright >30 degrees and has been stacking up pillows at night to sleep and breath.  She is using ?nocturnal vent support at night, she says it is a machine, not cpap, but it hooks up to her trach that has been helping.   She also has lower extremity swelling although her weight is down from prior visits and is apparently lower than hospital discharge as well.  Today she is down to 238lb's and she was 244lb's on discharge.    In other great news, Melody Gray has stopped smoking and has been smoke free for the past 5 weeks! She says it is difficult and she has cravings, however, she has no intention to restart and is doing well. I congratulated her on her great  efforts and offered support if she needs to continue on this path.   Past Medical History  Diagnosis Date  . Sarcoidosis     Followed by Dr. Melvyn Novas; w/ liver involvement per biopsy 12/09, Reversible airway component so started on [facility] 01/2010; HFA 75% p coaching 05/2010  . Hypoxemia     CT angiogram 9/11>> No PE; PFTs 10/11- FEV1 1.20 (49%) with 16% better p B2, DLCO 33%> corrects to 84; O2 sats ok on 4 lpm X rapid walk X 3 laps 05/2010  . Morbid obesity     Target wt= 153 for BMI <30  . QT prolongation   . Diabetes mellitus   . Hypertension   . Hx of cardiac cath 2/08    No CAD, no RAS,  normal EF  . Seborrheic dermatitis of scalp   . 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 Hep B serology neg. but Hep C ab +, HIV neg. 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 Cardiomyopathy, nonischemic     EF 45% 12/10; Echo 7/11 normal EF, PAS 48  . Diabetic retinopathy     Right eye 2/11  . Health maintenance examination     Mammogram 05/2010 Negative; Last Pap smear 03/2008; Last DM eye exam 2/11> mild non-proliferative diabetic retinopathy. OD  .  Helicobacter pylori ab+ 05/2011    Pt was symptomatic and treatment planned for 05/2011  . CHF (congestive heart failure)    Current Outpatient Prescriptions  Medication Sig Dispense Refill  . acetaminophen (TYLENOL) 325 MG tablet Take 2 tablets (650 mg total) by mouth every 4 (four) hours as needed for fever or mild pain.      Marland Kitchen antiseptic oral rinse (BIOTENE) LIQD 15 mLs by Mouth Rinse route QID.      Marland Kitchen aspirin EC 81 MG EC tablet Take 1 tablet (81 mg total) by mouth daily.      . carvedilol (COREG) 25 MG tablet take 1 tablet by mouth twice a day with food  60 tablet  0  . chlorhexidine (PERIDEX) 0.12 % solution Use as directed 15 mLs in the mouth or throat 2 (two) times daily.  120 mL  0  . heparin 5000 UNIT/ML injection Inject 1 mL (5,000 Units total) into the skin every 8  (eight) hours.  1 mL    . hydrALAZINE (APRESOLINE) 20 MG/ML injection Inject 0.5-2 mLs (10-40 mg total) into the vein every 4 (four) hours as needed (SBp 180 & above).  1 mL    . insulin aspart (NOVOLOG) 100 UNIT/ML injection CBG < 70: implement hypoglycemia protocol CBG 70 - 120: 0 units CBG 121 - 150: 1 unit CBG 151 - 200: 2 units CBG 201 - 250: 3 units CBG 251 - 300: 5 units CBG 301 - 350: 7 units CBG 351 - 400: 9 units CBG > 400: call MD and obtain STAT lab verification  10 mL  11  . insulin glargine (LANTUS) 100 UNIT/ML injection Inject 0.05 mLs (5 Units total) into the skin at bedtime.  10 mL  11  . pantoprazole sodium (PROTONIX) 40 mg/20 mL PACK Place 20 mLs (40 mg total) into feeding tube daily at 12 noon.  30 each    . predniSONE (DELTASONE) 10 MG tablet Place 3 tablets (30 mg total) into feeding tube daily with breakfast.      . sodium chloride 0.45 % solution 50 ml / hr    0   No current facility-administered medications for this visit.   Family History  Problem Relation Age of Onset  . Cancer Mother     colon cancer  . Multiple sclerosis Father   . Asthma Sister     in childhood  . Diabetes Father   . Hypertension     History   Social History  . Marital Status: Single    Spouse Name: N/A    Number of Children: 2  . Years of Education: N/A   Occupational History  . 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 Smoking status: Former Smoker -- 0.30 packs/day for 20 years  . Smokeless tobacco: Never Used     Comment: Needs to cut back.  . Alcohol Use: No  . Drug Use: No  . Sexual Activity: Not Currently    Birth Control/ Protection: None   Other Topics Concern  . None   Social History Narrative   Diabetic card given 05/03/2010.   Financial assistance approved for 100% discount at [facility] and has [facility] card. [name] 12/07/2009.      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   Review of Systems:    Constitutional:  Denies fever, chills  HEENT:  Denies congestion, sore throat  Respiratory:  SOB, DOE, on  home o2 2L   Cardiovascular:  Leg swelling. Denies chest pain, palpitations.  Gastrointestinal:  Denies nausea, vomiting, abdominal pain, diarrhea, constipation  Genitourinary:  Denies dysuria  Musculoskeletal:  Denies myalgias  Skin:  Trach in place.   Neurological:  Denies dizziness and headaches.    Objective:  Physical Exam: Filed Vitals:   09/09/13 1339  BP: 145/85  Pulse: 83  Temp: 97.2 F (36.2 C)  TempSrc: Oral  Height: 5\' 1"  (1.549 m)  Weight: 238 lb 12.8 oz (108.319 kg)  SpO2: 98%   Vitals reviewed. General: sitting in chair, NAD, on 2L [place] O2 HEENT: EOMI Neck: trach in place, clean, dressing in place Cardiac: RRR Pulm: coarse b/l breath sounds, -wheezing, no obvious rales on exam Abd: soft,obese, nontender, nondistended, BS present Ext: warm and well perfused, +2 pitting edema b/l lower extremities, mild edema upper extremities as well, right extremities seem to be slightly more edematous than left Neuro: alert and oriented X3, strength and sensation to light touch equal in bilateral upper and lower extremities  Assessment & Plan:  Discussed with Dr. Ellwood Dense Will restart lasix 40mg  qd, re-evaluate Friday, will need BMET at that time Lipid panel next visit Hospital bed

## 2013-09-09 NOTE — Patient Instructions (Signed)
General Instructions:  Please start lasix 40mg  daily and return to clinic on Friday for re-evaluation of volume status and symptoms  Please follow up with ENT and pulmonary as scheduled and also touch base with Dr. Stanford Breed as scheduled  Treatment Goals:  Goals (1 Years of Data) as of 09/09/13         As of Today 08/30/13 08/15/13 08/15/13 08/15/13     Blood Pressure    . Blood Pressure < 140/90  145/85  172/77 136/63 130/78    . Blood Pressure < 140/90  145/85  172/77 136/63 130/78    . Blood Pressure < 140/90  145/85  172/77 136/63 130/78       Note created  04/16/2012  4:59 PM by Jerene Pitch, MD   Goals (1 Years of Data) as of 04/16/2012          As of Today 04/04/12 03/07/12 02/12/12 01/19/12     Blood Pressure    . Blood Pressure < 140/90  139/81 120/80 152/92 124/80 148/94    . Blood Pressure < 140/90  139/81 120/80 152/92 124/80 148/94     Lifestyle    . Quit smoking / using tobacco  No         Result Component    . HEMOGLOBIN A1C < 7.0  7.0    6.7    . LDL CALC < 100           Weight    . Weight < 200 lb (90.719 kg)  218 lb 1.6 oz (98.93 kg) 215 lb 6.4 oz (97.705 kg) 211 lb (95.709 kg) 213 lb 6.4 oz (96.798 kg) 218 lb 4.8 oz (99.02 kg)     BP in clinic today 139/81.  Compliant with all her medications  -continue Coreg 12.5mg  BID, lasix 40mg  qd, and Benicar 20mg  qd            Lifestyle    . Quit smoking / using tobacco           Result Component    . HEMOGLOBIN A1C < 7.0   8.1       . LDL CALC < 100           Weight    . Weight < 200 lb (90.719 kg)  238 lb 12.8 oz (108.319 kg)  241 lb 6.5 oz (109.5 kg)        Progress Toward Treatment Goals:  Treatment Goal 09/09/2013  Hemoglobin A1C deteriorated  Blood pressure at goal  Stop smoking -    Self Care Goals & Plans:  Self Care Goal 09/09/2013  Manage my medications take my medicines as prescribed; bring my medications to every visit; refill my medications on time  Monitor my health keep track of my blood  glucose; bring my glucose meter and log to each visit  Eat healthy foods eat more vegetables; eat baked foods instead of fried foods; eat foods that are low in salt  Be physically active find an activity I enjoy  Stop smoking -    Home Blood Glucose Monitoring 09/09/2013  Check my blood sugar 3 times a day  When to check my blood sugar before meals     Care Management & Community Referrals:  Referral 09/03/2012  Referrals made to community resources exercise/physical therapy

## 2013-09-09 NOTE — Assessment & Plan Note (Addendum)
Trach in place s/p recent hospitalization. Hx of CHF, sarcoidosis, and likely OSA. Chronic tobacco abuse, now had quit x5 weeks.   -trach self care -needs to follow up with pulmonary -currently still on prednisone taper, now 10mg  daily

## 2013-09-09 NOTE — Assessment & Plan Note (Signed)
BP Readings from Last 3 Encounters:  09/09/13 145/85  08/15/13 172/77  08/15/13 172/77   Lab Results  Component Value Date   NA 141 09/03/2013   K 4.0 09/03/2013   CREATININE 0.75 09/03/2013    Assessment: Blood pressure control: controlled Progress toward BP goal:  at goal Comments: currently just on coreg and amlodipine Plan: Medications:  continue current medications carvedilol 25mg  bid and amlodipine 5mg  qd Educational resources provided:   Self management tools provided:   Other plans: needs to follow up with cardiology

## 2013-09-09 NOTE — Assessment & Plan Note (Signed)
EF improved from prior echo's. Compliant with medications but has been off lasix since hospitalization.  Edematous extremities and worsening orthopnea reported today. Requires hospital bed, had improved breathing while hospitalized but worse with home bed.   Melody Gray suffers from CHF and has trouble breathing at night when head is elevated less than 30 degrees.  Bed wedges/pillows do not provide enough elevation to resolve breathing issues.  Orthopnea and trouble breathing at night causes her to require frequent changes in body position which cannot be achieve with a normal bed.   -lasix 40mg  daily given improved renal function -revaluate fluid status and bmet Friday, may need to adjust lasix dose -needs to follow up with Dr. Stanford Breed s/p hospitalization -continue coreg

## 2013-09-09 NOTE — Assessment & Plan Note (Signed)
  Assessment: Progress toward smoking cessation:   STOPPED SMOKING X5 WEEKS Barriers to progress toward smoking cessation:    Comments: congratulated on success of smoking cessation since hospitalization Plan: Instruction/counseling given:  I commended patient for quitting and reviewed strategies for preventing relapses. Educational resources provided:    Self management tools provided:    Medications to assist with smoking cessation:  None Patient agreed to the following self-care plans for smoking cessation:    Other plans: offered support and NRT if needed in the future

## 2013-09-09 NOTE — Assessment & Plan Note (Addendum)
Lab Results  Component Value Date   HGBA1C 8.1* 08/30/2013   HGBA1C 6.5 05/20/2013   HGBA1C 6.3 09/03/2012    Assessment: Diabetes control: fair control Progress toward A1C goal:  deteriorated Comments: reports improved glycemic control <200 at home, using SSI and levemir since discharge. Did not bring meter today  Plan: Medications:  Continue levemir 26 units in am and 23 units in pm with customized sliding scale cbg 181-240 2 units novolog, 241-300 4 units novolog, 301-350 6 units novolog, and 351-400 8 units novolog Home glucose monitoring: Frequency: 3 times a day Timing: before meals Instruction/counseling given: reminded to bring blood glucose meter & log to each visit, reminded to bring medications to each visit, discussed foot care and discussed diet Educational resources provided: brochure Self management tools provided:   Other plans: needs CDE visit in near future and recheck a1c 3 months

## 2013-09-10 DIAGNOSIS — IMO0001 Reserved for inherently not codable concepts without codable children: Secondary | ICD-10-CM | POA: Diagnosis not present

## 2013-09-10 DIAGNOSIS — Z430 Encounter for attention to tracheostomy: Secondary | ICD-10-CM | POA: Diagnosis not present

## 2013-09-10 DIAGNOSIS — I5033 Acute on chronic diastolic (congestive) heart failure: Secondary | ICD-10-CM | POA: Diagnosis not present

## 2013-09-10 DIAGNOSIS — D869 Sarcoidosis, unspecified: Secondary | ICD-10-CM | POA: Diagnosis not present

## 2013-09-10 DIAGNOSIS — J99 Respiratory disorders in diseases classified elsewhere: Secondary | ICD-10-CM | POA: Diagnosis not present

## 2013-09-10 DIAGNOSIS — I509 Heart failure, unspecified: Secondary | ICD-10-CM | POA: Diagnosis not present

## 2013-09-11 DIAGNOSIS — Z430 Encounter for attention to tracheostomy: Secondary | ICD-10-CM | POA: Diagnosis not present

## 2013-09-11 DIAGNOSIS — D869 Sarcoidosis, unspecified: Secondary | ICD-10-CM | POA: Diagnosis not present

## 2013-09-11 DIAGNOSIS — J99 Respiratory disorders in diseases classified elsewhere: Secondary | ICD-10-CM | POA: Diagnosis not present

## 2013-09-11 DIAGNOSIS — IMO0001 Reserved for inherently not codable concepts without codable children: Secondary | ICD-10-CM | POA: Diagnosis not present

## 2013-09-11 DIAGNOSIS — I5033 Acute on chronic diastolic (congestive) heart failure: Secondary | ICD-10-CM | POA: Diagnosis not present

## 2013-09-11 DIAGNOSIS — I509 Heart failure, unspecified: Secondary | ICD-10-CM | POA: Diagnosis not present

## 2013-09-12 ENCOUNTER — Ambulatory Visit (INDEPENDENT_AMBULATORY_CARE_PROVIDER_SITE_OTHER): Payer: Medicare Other | Admitting: Internal Medicine

## 2013-09-12 ENCOUNTER — Encounter: Payer: Self-pay | Admitting: Internal Medicine

## 2013-09-12 VITALS — BP 121/74 | HR 99 | Temp 98.4°F | Ht 61.0 in | Wt 235.4 lb

## 2013-09-12 DIAGNOSIS — I2789 Other specified pulmonary heart diseases: Secondary | ICD-10-CM | POA: Diagnosis not present

## 2013-09-12 DIAGNOSIS — D869 Sarcoidosis, unspecified: Secondary | ICD-10-CM

## 2013-09-12 DIAGNOSIS — I509 Heart failure, unspecified: Secondary | ICD-10-CM | POA: Diagnosis not present

## 2013-09-12 DIAGNOSIS — I10 Essential (primary) hypertension: Secondary | ICD-10-CM | POA: Diagnosis not present

## 2013-09-12 DIAGNOSIS — E785 Hyperlipidemia, unspecified: Secondary | ICD-10-CM | POA: Diagnosis not present

## 2013-09-12 DIAGNOSIS — R109 Unspecified abdominal pain: Secondary | ICD-10-CM | POA: Diagnosis not present

## 2013-09-12 DIAGNOSIS — J9610 Chronic respiratory failure, unspecified whether with hypoxia or hypercapnia: Secondary | ICD-10-CM | POA: Diagnosis not present

## 2013-09-12 DIAGNOSIS — R040 Epistaxis: Secondary | ICD-10-CM | POA: Diagnosis not present

## 2013-09-12 DIAGNOSIS — E119 Type 2 diabetes mellitus without complications: Secondary | ICD-10-CM | POA: Diagnosis not present

## 2013-09-12 DIAGNOSIS — F17200 Nicotine dependence, unspecified, uncomplicated: Secondary | ICD-10-CM | POA: Diagnosis not present

## 2013-09-12 LAB — GLUCOSE, CAPILLARY: Glucose-Capillary: 152 mg/dL — ABNORMAL HIGH (ref 70–99)

## 2013-09-12 NOTE — Assessment & Plan Note (Addendum)
LE edema improved, still has a degree of orthopnea, but I suspect this is multifactorial given obesity & lung disease (today she reports she has always required 2 thick pillows to sleep).  Weight down 3 lbs from 3 days ago.    -Continue lasix 40 daily (this was pre hospital dose as well) -BMET today, will also add lipids since she is due -Pt to make f/u appt with her cardiologist (Dr. Stanford Breed)  ADDENDUM: K mildly low - 3.4.  Irbesartan had been d/c at discharge d/t ARF in setting of over diuresis and ARB tx - will restart.

## 2013-09-12 NOTE — Progress Notes (Signed)
Subjective:   Patient ID: Melody Gray female   DOB: 11-17-1970 43 y.o.   MRN: [id number]  Chief Complaint  Patient presents with  . Follow-up    Wants Handicap sticker    HPI: [name] is a 42 y.o. woman with history of pulmonary sarcoidosis, DM, CKD, HTN, and CHF who presents for above reason.   She reports she is doing well, without complaints.   She returns after visit on 09/09/13 with PCP for HFU - at that time noted worsening orthopnea and LE edema.  LE edema is much improved.  She uses 3 pillows to sleep at night. Reports breathing is "pretty good right now"  Review of Systems: Constitutional: Denies fever, chills, diaphoresis, appetite change and fatigue. Reports energy is "pretty good" HEENT: Denies photophobia, eye pain, redness, hearing loss, ear pain, congestion, sore throat, rhinorrhea, sneezing, mouth sores, trouble swallowing, neck pain, neck stiffness and tinnitus. "can't see anyways, older I get, eyes get worse, Eye MD says it's fine," wears reading glasses Respiratory: Denies chest tightness, and wheezing. DOE, normal for her right now, +cough with clear sputum Cardiovascular: Denies chest pain, palpitations; improved leg swelling  Gastrointestinal: Denies nausea, vomiting, abdominal pain, diarrhea, constipation,blood in stool and abdominal distention.  Genitourinary: Denies dysuria, urgency, frequency, hematuria, flank pain and difficulty urinating.  Musculoskeletal: Denies myalgias, back pain, joint swelling, arthralgias and gait problem.  Skin: Denies pallor, rash and wound.  Neurological: Denies dizziness, seizures, syncope, weakness, lightheadedness, numbness and headaches.  Hematological: No obvious s/s of bleeding   Past Medical History  Diagnosis Date  . Sarcoidosis     Followed by Dr. Melvyn Novas; w/ liver involvement per biopsy 12/09, Reversible airway component so started on [facility] 01/2010; HFA 75% p coaching 05/2010  . Hypoxemia     CT angiogram  9/11>> No PE; PFTs 10/11- FEV1 1.20 (49%) with 16% better p B2, DLCO 33%> corrects to 84; O2 sats ok on 4 lpm X rapid walk X 3 laps 05/2010  . Morbid obesity     Target wt= 153 for BMI <30  . QT prolongation   . Diabetes mellitus   . Hypertension   . Hx of cardiac cath 2/08    No CAD, no RAS,  normal EF  . Seborrheic dermatitis of scalp   . 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 Hep B serology neg. but Hep C ab +, HIV neg. 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 Cardiomyopathy, nonischemic     EF 45% 12/10; Echo 7/11 normal EF, PAS 48  . Diabetic retinopathy     Right eye 2/11  . Health maintenance examination     Mammogram 05/2010 Negative; Last Pap smear 03/2008; Last DM eye exam 2/11> mild non-proliferative diabetic retinopathy. OD  . Helicobacter pylori ab+ 05/2011    Pt was symptomatic and treatment planned for 05/2011  . CHF (congestive heart failure)    Current Outpatient Prescriptions  Medication Sig Dispense Refill  . acetaminophen (TYLENOL) 325 MG tablet Take 2 tablets (650 mg total) by mouth every 4 (four) hours as needed for fever or mild pain.      Marland Kitchen amLODipine (NORVASC) 5 MG tablet Take 5 mg by mouth daily.      Marland Kitchen antiseptic oral rinse (BIOTENE) LIQD 15 mLs by Mouth Rinse route QID.      Marland Kitchen aspirin EC 81 MG EC tablet  Take 1 tablet (81 mg total) by mouth daily.      . carvedilol (COREG) 25 MG tablet take 1 tablet by mouth twice a day with food  60 tablet  0  . ferrous sulfate 325 (65 FE) MG tablet Take 325 mg by mouth 3 (three) times daily with meals.      . furosemide (LASIX) 40 MG tablet Take 1 tablet (40 mg total) by mouth daily.  30 tablet  1  . insulin aspart (NOVOLOG) 100 UNIT/ML injection CBG < 70: implement hypoglycemia protocol CBG 70 - 130: 0 units CBG 131 - 180: 1 unit CBG 181 - 240: 2 units CBG 241-300: 4 units CBG 301-350: 6 units CBG 351-400: 8 units CBG 201 - 250: 3 units CBG  251 - 300: 5 units CBG 301 - 350: 7 units CBG 351 - 400: 9 units CBG > 400: call MD and obtain STAT lab verification      . Melatonin 3 MG TABS Take 3 mg by mouth at bedtime.      . metFORMIN (GLUCOPHAGE) 1000 MG tablet Take 1,000 mg by mouth 2 (two) times daily with a meal.      . pantoprazole (PROTONIX) 40 MG tablet Take 40 mg by mouth 2 (two) times daily.      . predniSONE (DELTASONE) 10 MG tablet Take 10 mg by mouth daily with breakfast.        No current facility-administered medications for this visit.   Family History  Problem Relation Age of Onset  . Cancer Mother     colon cancer  . Multiple sclerosis Father   . Asthma Sister     in childhood  . Diabetes Father   . Hypertension     History   Social History  . Marital Status: Single    Spouse Name: N/A    Number of Children: 2  . Years of Education: N/A   Occupational History  . 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 Smoking status: Former Smoker -- 0.30 packs/day for 20 years  . Smokeless tobacco: Never Used     Comment: Needs to cut back.  . Alcohol Use: No  . Drug Use: No  . Sexual Activity: Not Currently    Birth Control/ Protection: None   Other Topics Concern  . Not on file   Social History Narrative   Diabetic card given 05/03/2010.   Financial assistance approved for 100% discount at [facility] and has [facility] card. Melody Gray 12/07/2009.      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    Objective:  Physical Exam: Filed Vitals:   09/12/13 0944  BP: 121/74  Pulse: 99  Temp: 98.4 F (36.9 C)  TempSrc: Oral  Height: 5\' 1"  (1.549 m)  Weight: 235 lb 6.4 oz (106.777 kg)  SpO2: 97%   General: pleasant, appears as stated age, obese, Orient (2L) in place HEENT: PERRL, EOMI, no scleral icterus, trach in place Cardiac: RRR, no rubs, murmurs or gallops Pulm: decreased air entry posteriorly, clear without wheezing anteriorly Abd: soft, nontender, nondistended,  BS normoactive Ext: warm and well perfused, right LE trace pretibial pitting edema, left LE trace to 1+ pretibial pitting edema Neuro: alert and oriented X3, cranial nerves II-XII grossly intact   Assessment & Plan:  Case and care discussed with Dr. Marinda Elk.  Please see problem oriented charting for further details. She has appt with PCP  on May 26th.  Patient to make appt with Dr. Stanford Breed.

## 2013-09-12 NOTE — Assessment & Plan Note (Signed)
Provided application for handicapped placard for permanent disability registration card.

## 2013-09-12 NOTE — Progress Notes (Signed)
Case discussed with Dr. Sharda soon after the resident saw the patient.  We reviewed the resident's history and exam and pertinent patient test results.  I agree with the assessment, diagnosis, and plan of care documented in the resident's note. 

## 2013-09-12 NOTE — Patient Instructions (Signed)
-  Continue taking lasix 40mg  daily (also called furosemide).    -Be sure to schedule a follow up with your cardiologist, Dr. Stanford Breed  -Today I provided you with an application for handicapped placard for Permanent Disability Registration Card  Please try to bring all your medicines next time. This helps [name] take good care of you and stops mistakes from medicines that could hurt you.  Should you have any new or worsening symptoms, please be sure to call the clinic at [phone].

## 2013-09-13 LAB — BASIC METABOLIC PANEL
BUN: 13 mg/dL (ref 6–23)
CO2: 34 mEq/L — ABNORMAL HIGH (ref 19–32)
Calcium: 7.9 mg/dL — ABNORMAL LOW (ref 8.4–10.5)
Chloride: 98 mEq/L (ref 96–112)
Creat: 0.95 mg/dL (ref 0.50–1.10)
Glucose, Bld: 198 mg/dL — ABNORMAL HIGH (ref 70–99)
Potassium: 3.4 mEq/L — ABNORMAL LOW (ref 3.5–5.3)
Sodium: 142 mEq/L (ref 135–145)

## 2013-09-13 LAB — LIPID PANEL
Cholesterol: 198 mg/dL (ref 0–200)
HDL: 69 mg/dL (ref >39)
LDL Cholesterol: 108 mg/dL — ABNORMAL HIGH (ref 0–99)
Total CHOL/HDL Ratio: 2.9 Ratio
Triglycerides: 103 mg/dL (ref <150)
VLDL: 21 mg/dL (ref 0–40)

## 2013-09-15 DIAGNOSIS — IMO0001 Reserved for inherently not codable concepts without codable children: Secondary | ICD-10-CM | POA: Diagnosis not present

## 2013-09-15 DIAGNOSIS — Z430 Encounter for attention to tracheostomy: Secondary | ICD-10-CM | POA: Diagnosis not present

## 2013-09-15 DIAGNOSIS — I5033 Acute on chronic diastolic (congestive) heart failure: Secondary | ICD-10-CM | POA: Diagnosis not present

## 2013-09-15 DIAGNOSIS — J99 Respiratory disorders in diseases classified elsewhere: Secondary | ICD-10-CM | POA: Diagnosis not present

## 2013-09-15 DIAGNOSIS — D869 Sarcoidosis, unspecified: Secondary | ICD-10-CM | POA: Diagnosis not present

## 2013-09-15 DIAGNOSIS — E785 Hyperlipidemia, unspecified: Secondary | ICD-10-CM | POA: Insufficient documentation

## 2013-09-15 DIAGNOSIS — I509 Heart failure, unspecified: Secondary | ICD-10-CM | POA: Diagnosis not present

## 2013-09-15 NOTE — Assessment & Plan Note (Signed)
LDL 108.  Initiate statin therapy at next visit given h/o DM.

## 2013-09-16 ENCOUNTER — Encounter: Payer: Medicare Other | Admitting: Internal Medicine

## 2013-09-16 DIAGNOSIS — J99 Respiratory disorders in diseases classified elsewhere: Secondary | ICD-10-CM | POA: Diagnosis not present

## 2013-09-16 DIAGNOSIS — Z430 Encounter for attention to tracheostomy: Secondary | ICD-10-CM | POA: Diagnosis not present

## 2013-09-16 DIAGNOSIS — IMO0001 Reserved for inherently not codable concepts without codable children: Secondary | ICD-10-CM | POA: Diagnosis not present

## 2013-09-16 DIAGNOSIS — I509 Heart failure, unspecified: Secondary | ICD-10-CM | POA: Diagnosis not present

## 2013-09-16 DIAGNOSIS — D869 Sarcoidosis, unspecified: Secondary | ICD-10-CM | POA: Diagnosis not present

## 2013-09-16 DIAGNOSIS — I5033 Acute on chronic diastolic (congestive) heart failure: Secondary | ICD-10-CM | POA: Diagnosis not present

## 2013-09-16 MED ORDER — IRBESARTAN 150 MG PO TABS: 150.0000 mg | ORAL_TABLET | Freq: Every day | ORAL | Status: DC

## 2013-09-16 NOTE — Addendum Note (Signed)
Addended by: Othella Boyer on: 09/16/2013 11:36 AM   Modules accepted: Orders

## 2013-09-17 ENCOUNTER — Telehealth: Payer: Self-pay | Admitting: *Deleted

## 2013-09-17 NOTE — Telephone Encounter (Signed)
I think getting the patient in to see Butch Penny would be a good idea. Thank you

## 2013-09-17 NOTE — Telephone Encounter (Signed)
Call from [place], [place] with [facility] - # [phone] Nurse reports blood sugars are low in AM 101 - 171 but are higher during the day.  On 5/15 @ 1230 CBG was 368 before lunch. On 18th before dinner CBG was 510 but pt had eaten Peanut Brittle, pt did not recheck that night but the next day reports cbg of 300.    Pt was seen in clinic on 5/15 - BMET done and Glucose was 198 Please advise, I could set up an appointment with [facility].  I talked with Nurse and she states pt could use help with DM management.  Also she was not aware pt had a sliding scale insulin, I read the directions and there are two sets of directions with the Novolog.  Please correct so I can give to the nurse.

## 2013-09-18 DIAGNOSIS — J99 Respiratory disorders in diseases classified elsewhere: Secondary | ICD-10-CM | POA: Diagnosis not present

## 2013-09-18 DIAGNOSIS — IMO0001 Reserved for inherently not codable concepts without codable children: Secondary | ICD-10-CM | POA: Diagnosis not present

## 2013-09-18 DIAGNOSIS — Z430 Encounter for attention to tracheostomy: Secondary | ICD-10-CM | POA: Diagnosis not present

## 2013-09-18 DIAGNOSIS — D869 Sarcoidosis, unspecified: Secondary | ICD-10-CM | POA: Diagnosis not present

## 2013-09-18 DIAGNOSIS — I509 Heart failure, unspecified: Secondary | ICD-10-CM | POA: Diagnosis not present

## 2013-09-18 DIAGNOSIS — R490 Dysphonia: Secondary | ICD-10-CM | POA: Diagnosis not present

## 2013-09-18 DIAGNOSIS — I5033 Acute on chronic diastolic (congestive) heart failure: Secondary | ICD-10-CM | POA: Diagnosis not present

## 2013-09-18 NOTE — Telephone Encounter (Signed)
Melody Gray, please call pt and set up a time for an appointment with you.

## 2013-09-18 NOTE — Telephone Encounter (Signed)
Patient does not have question or concerns at this time about her blood sugars. She scheduled a visit with CDE, RD for next Thursday and reports her current Novolog "Sliding scale" directions as : Check blood sugar before each meal and bedtime and inject the following Novolog insulin as directed:  70- 130 take no novolog 131-180 2 units 181-240 4 units 241-300- 6 units 301-350 -8 units 351-400 10 units > 400 call doctor

## 2013-09-18 NOTE — Telephone Encounter (Signed)
Thank you :)

## 2013-09-18 NOTE — Progress Notes (Signed)
Case discussed with Dr. Qureshi at the time of the visit.  We reviewed the resident's history and exam and pertinent patient test results.  I agree with the assessment, diagnosis, and plan of care documented in the resident's note. 

## 2013-09-19 NOTE — Telephone Encounter (Signed)
I am a bit confused as this scale that you wrote Melody Gray appears to be different than the one she brought in from discharge from the hospital. Is the one you listed in the note new? Or adjust by your for current complaints?  Here is the one i documented on my note as the one she was using last time:  At that time she did not report low sugars.  Medications: Continue levemir 26 units in am and 23 units in pm with customized sliding scale cbg 181-240 2 units novolog, 241-300 4 units novolog, 301-350 6 units novolog, and 351-400 8 units novolog

## 2013-09-23 ENCOUNTER — Encounter: Payer: Medicare Other | Admitting: Internal Medicine

## 2013-09-23 DIAGNOSIS — J99 Respiratory disorders in diseases classified elsewhere: Secondary | ICD-10-CM | POA: Diagnosis not present

## 2013-09-23 DIAGNOSIS — I509 Heart failure, unspecified: Secondary | ICD-10-CM | POA: Diagnosis not present

## 2013-09-23 DIAGNOSIS — Z430 Encounter for attention to tracheostomy: Secondary | ICD-10-CM | POA: Diagnosis not present

## 2013-09-23 DIAGNOSIS — IMO0001 Reserved for inherently not codable concepts without codable children: Secondary | ICD-10-CM | POA: Diagnosis not present

## 2013-09-23 DIAGNOSIS — D869 Sarcoidosis, unspecified: Secondary | ICD-10-CM | POA: Diagnosis not present

## 2013-09-23 DIAGNOSIS — I5033 Acute on chronic diastolic (congestive) heart failure: Secondary | ICD-10-CM | POA: Diagnosis not present

## 2013-09-23 NOTE — Progress Notes (Signed)
Case discussed with Dr. Qureshi at the time of the visit.  We reviewed the resident's history and exam and pertinent patient test results.  I agree with the assessment, diagnosis, and plan of care documented in the resident's note. 

## 2013-09-25 ENCOUNTER — Ambulatory Visit: Payer: Medicare Other | Admitting: Dietician

## 2013-09-25 ENCOUNTER — Other Ambulatory Visit: Payer: Self-pay | Admitting: *Deleted

## 2013-09-25 ENCOUNTER — Encounter: Payer: Self-pay | Admitting: Internal Medicine

## 2013-09-25 DIAGNOSIS — IMO0001 Reserved for inherently not codable concepts without codable children: Secondary | ICD-10-CM | POA: Diagnosis not present

## 2013-09-25 DIAGNOSIS — I509 Heart failure, unspecified: Secondary | ICD-10-CM | POA: Diagnosis not present

## 2013-09-25 DIAGNOSIS — D869 Sarcoidosis, unspecified: Secondary | ICD-10-CM | POA: Diagnosis not present

## 2013-09-25 DIAGNOSIS — Z430 Encounter for attention to tracheostomy: Secondary | ICD-10-CM | POA: Diagnosis not present

## 2013-09-25 DIAGNOSIS — J99 Respiratory disorders in diseases classified elsewhere: Secondary | ICD-10-CM | POA: Diagnosis not present

## 2013-09-25 DIAGNOSIS — I5033 Acute on chronic diastolic (congestive) heart failure: Secondary | ICD-10-CM | POA: Diagnosis not present

## 2013-09-26 ENCOUNTER — Encounter: Payer: Self-pay | Admitting: Internal Medicine

## 2013-09-26 MED ORDER — INSULIN DETEMIR 100 UNIT/ML ~~LOC~~ SOLN: SUBCUTANEOUS | Status: DC

## 2013-09-26 NOTE — Telephone Encounter (Signed)
Melody Gray,  Where is this sliding scale from? It is not the one she should be on.  Melody Gray can you check this, i never got a response from my last comment.   Thanks,  Dr q

## 2013-09-29 ENCOUNTER — Encounter: Payer: Self-pay | Admitting: Dietician

## 2013-09-29 MED ORDER — INSULIN ASPART 100 UNIT/ML ~~LOC~~ SOLN: SUBCUTANEOUS | Status: DC

## 2013-09-29 NOTE — Addendum Note (Signed)
Addended by: Wilber Oliphant on: 09/29/2013 12:02 PM   Modules accepted: Orders

## 2013-09-29 NOTE — Telephone Encounter (Signed)
Called patient: she said the Novolog scale she was using was from her hospital discharge. Gave her Dr. Fabio Neighbors Novolog scale. Starting at 181 today. (customized sliding scale cbg 181-240 2 units novolog, 241-300 4 units novolog, 301-350 6 units novolog, and 351-400 8 units novolog). Please update these instructions in her EPIC chart medication list so we are all clear. Thanks. The email via Epic that i sent did not go through per Dimitria because her email address has changed. Have asked from desk to update it in Epic.

## 2013-09-30 ENCOUNTER — Telehealth: Payer: Self-pay | Admitting: Dietician

## 2013-09-30 ENCOUNTER — Encounter: Payer: Medicare Other | Admitting: Internal Medicine

## 2013-09-30 DIAGNOSIS — IMO0001 Reserved for inherently not codable concepts without codable children: Secondary | ICD-10-CM | POA: Diagnosis not present

## 2013-09-30 DIAGNOSIS — I509 Heart failure, unspecified: Secondary | ICD-10-CM | POA: Diagnosis not present

## 2013-09-30 DIAGNOSIS — J99 Respiratory disorders in diseases classified elsewhere: Secondary | ICD-10-CM | POA: Diagnosis not present

## 2013-09-30 DIAGNOSIS — D869 Sarcoidosis, unspecified: Secondary | ICD-10-CM | POA: Diagnosis not present

## 2013-09-30 DIAGNOSIS — I5033 Acute on chronic diastolic (congestive) heart failure: Secondary | ICD-10-CM | POA: Diagnosis not present

## 2013-09-30 DIAGNOSIS — Z430 Encounter for attention to tracheostomy: Secondary | ICD-10-CM | POA: Diagnosis not present

## 2013-09-30 NOTE — Telephone Encounter (Signed)
Melody Gray wants an order for patient to see RD, CDE here.  She reports frustration in working with [name]:  151 is the lowest and the highest blood sugar is 363 before meals, she has been trying to talk to her about carbs, shopping, but getting non compliance and food cost stated as a barrier.  Left Melody Gray a message at [phone], [name] did not attend RD visit last week. She has a current order so does not need another.

## 2013-09-30 NOTE — Telephone Encounter (Signed)
Who is elaine?

## 2013-10-01 ENCOUNTER — Other Ambulatory Visit: Payer: Self-pay | Admitting: Dietician

## 2013-10-01 ENCOUNTER — Encounter: Payer: Self-pay | Admitting: Dietician

## 2013-10-01 ENCOUNTER — Other Ambulatory Visit: Payer: Self-pay | Admitting: Internal Medicine

## 2013-10-01 ENCOUNTER — Ambulatory Visit (INDEPENDENT_AMBULATORY_CARE_PROVIDER_SITE_OTHER): Payer: Medicare Other | Admitting: Dietician

## 2013-10-01 VITALS — Ht 61.5 in | Wt 236.0 lb

## 2013-10-01 DIAGNOSIS — E119 Type 2 diabetes mellitus without complications: Secondary | ICD-10-CM

## 2013-10-01 MED ORDER — GLUCOSE BLOOD VI STRP: ORAL_STRIP | Status: DC

## 2013-10-01 MED ORDER — INSULIN DETEMIR 100 UNIT/ML ~~LOC~~ SOLN: SUBCUTANEOUS | Status: DC

## 2013-10-01 MED ORDER — ACCU-CHEK FASTCLIX LANCETS MISC: Status: DC

## 2013-10-01 NOTE — Telephone Encounter (Signed)
Sorry, I had documented this in another part of the note. Melody Gray is an [place] seeing Yalda.

## 2013-10-01 NOTE — Telephone Encounter (Signed)
Patient requests testing supplies for a meter we can download

## 2013-10-01 NOTE — Progress Notes (Signed)
Follow-up visit:  MNT2 Last visit 3-6-215 Medical Nutrition Therapy:  Appt start time: 1430 end time:  1500.  Assessment:  Primary concerns today: Weight management and Blood sugar control.  Patient recently quit smoking and has also had trache inserted 24-hr recall: Reveals some high fat and processed foods. Patient is aware that these foods are not the best choice and plans to work on improving her choices in the future after she feels confident in her ability to maintain smoking cessation. Recent physical activity includes working out on new exercise machine. MEDICATIONS levemir 26 units ~ 9 am, 23 units Levemir  ~ 5 PM, 10 mg prednisone qam, Novolog correction only when CBG > 180 ( see medication list)  Blood sugar: unable to download her current meter, CBGs today 50-109-129, yesterday took 2 units Novolog and 23 units lemir about 5 PM before her dinner Weight: stable from last visit, decreased from 3 weeks ago  Progress Towards Goal(s):  In progress.   Nutritional Diagnosis:  NB-1.1 Food and nutrition-related knowledge deficit as related to her desire to decrease her weight and knowing foods that are low in caloric density and more nutritious improing   as evidenced by her naming healthier choices NB-1.4 Self-monitoring deficit related to not checking blood sugar regularly as related to need to self monitor to detect need for insulin changes.improved as evidenced by her meter showing daily self monitoring 2-3 x/day- resolved NB-1.6 Limited adherence to nutrition-related recommendations As related to competing values of maintianing smoking cessation.  As evidenced by her report and having quit smoking.    Intervention:  Nutrition health coaching about her goals and desires. Coordination of care with physician to address low blood sugar.   Monitoring/Evaluation:  Dietary intake, exercise, blood sugars/meter, and body weight {follow [id number]

## 2013-10-01 NOTE — Telephone Encounter (Addendum)
Melody Gray came in to office today and met with dietitian. She voiced dislike of Melody Gray's nutrition interventions. This was discussed with patient and physician. Agreement was for Melody Gray to come to office weekly to meet with RD, CDE and to notify Melody Gray that she does not need to cover nutrition or blood sugars with [name].  CDE discussed with Melody Gray, the [facility] nurse, that she did not need to talk or teach [name] nutrition or blood sugars and she can call us for nay questions or concerns. She verbalized understanding.

## 2013-10-01 NOTE — Telephone Encounter (Signed)
Okay. The orders are there please do follow up with her. She is at times difficult to get in the office but is great over the phone and through my chart so maybe we can reach out to her that way.

## 2013-10-02 ENCOUNTER — Other Ambulatory Visit: Payer: Self-pay | Admitting: *Deleted

## 2013-10-02 DIAGNOSIS — J99 Respiratory disorders in diseases classified elsewhere: Secondary | ICD-10-CM | POA: Diagnosis not present

## 2013-10-02 DIAGNOSIS — Z430 Encounter for attention to tracheostomy: Secondary | ICD-10-CM | POA: Diagnosis not present

## 2013-10-02 DIAGNOSIS — I509 Heart failure, unspecified: Secondary | ICD-10-CM | POA: Diagnosis not present

## 2013-10-02 DIAGNOSIS — I5033 Acute on chronic diastolic (congestive) heart failure: Secondary | ICD-10-CM | POA: Diagnosis not present

## 2013-10-02 DIAGNOSIS — D869 Sarcoidosis, unspecified: Secondary | ICD-10-CM | POA: Diagnosis not present

## 2013-10-02 DIAGNOSIS — IMO0001 Reserved for inherently not codable concepts without codable children: Secondary | ICD-10-CM | POA: Diagnosis not present

## 2013-10-03 ENCOUNTER — Telehealth: Payer: Self-pay | Admitting: Internal Medicine

## 2013-10-03 ENCOUNTER — Other Ambulatory Visit: Payer: Self-pay

## 2013-10-03 MED ORDER — CARVEDILOL 25 MG PO TABS: ORAL_TABLET | ORAL | Status: DC

## 2013-10-03 MED ORDER — AMLODIPINE BESYLATE 5 MG PO TABS: 5.0000 mg | ORAL_TABLET | Freq: Every day | ORAL | Status: DC

## 2013-10-03 MED ORDER — FERROUS SULFATE 325 (65 FE) MG PO TABS: 325.0000 mg | ORAL_TABLET | Freq: Three times a day (TID) | ORAL | Status: DC

## 2013-10-03 NOTE — Telephone Encounter (Signed)
Called pt - informed pt to call Dr Gustavus Bryant and Dr Jacalyn Lefevre offices to schedule appts; stated she will. Informed her Dr Eula Fried had refilled Amlodipine and Ferrous Sulfate as requested.

## 2013-10-03 NOTE — Telephone Encounter (Signed)
LMOM x 1 

## 2013-10-06 MED ORDER — PREDNISONE 10 MG PO TABS: 10.0000 mg | ORAL_TABLET | Freq: Every day | ORAL | Status: DC

## 2013-10-06 NOTE — Telephone Encounter (Signed)
Last ov w/ MW 4.2.15: Patient Instructions     Treatment for your abdominal tightness consists of avoiding foods that cause gas (especially beans and raw vegetables like spinach and salads boiled eggs) and citrucel 1 heaping tsp twice daily with a large glass of water. Pain should improve w/in 2 weeks and if not then consider further GI work up.  Wear 02 4lpm 24/7 and stop all smoking  Please remember to go to the lab department downstairs for your tests - we will call you with the results when they are available.  Make a food diary for the next two weeks and show it to your clinic doctor/ nutritionist  Please schedule a follow up office visit in 4 weeks, sooner if needed    Pt was admitted from 4/10-4/17 and was restarted on prednisone at a slow taper. Called spoke with patient who verified that she is now out of prednisone, and according to her taper is to be taking 10mg  daily.  Patient reports she is doing well since discharge and denies any complaints at this time.  She is due for appt w/ MW on 6.10.15 and is aware to keep this scheduled appt.  Dr Melvyn Novas please advise if okay to refill pt's prednisone.  Thank you.

## 2013-10-06 NOTE — Telephone Encounter (Signed)
Ok to refill 

## 2013-10-06 NOTE — Telephone Encounter (Signed)
Spoke with patient-she is aware that MW gave approval to refill Prednisone Rx. Nothing more needed and Rx sent.

## 2013-10-07 DIAGNOSIS — J99 Respiratory disorders in diseases classified elsewhere: Secondary | ICD-10-CM | POA: Diagnosis not present

## 2013-10-07 DIAGNOSIS — D869 Sarcoidosis, unspecified: Secondary | ICD-10-CM | POA: Diagnosis not present

## 2013-10-07 DIAGNOSIS — Z430 Encounter for attention to tracheostomy: Secondary | ICD-10-CM | POA: Diagnosis not present

## 2013-10-07 DIAGNOSIS — I5033 Acute on chronic diastolic (congestive) heart failure: Secondary | ICD-10-CM | POA: Diagnosis not present

## 2013-10-07 DIAGNOSIS — I509 Heart failure, unspecified: Secondary | ICD-10-CM | POA: Diagnosis not present

## 2013-10-07 DIAGNOSIS — IMO0001 Reserved for inherently not codable concepts without codable children: Secondary | ICD-10-CM | POA: Diagnosis not present

## 2013-10-08 ENCOUNTER — Encounter: Payer: Self-pay | Admitting: Internal Medicine

## 2013-10-08 ENCOUNTER — Ambulatory Visit (INDEPENDENT_AMBULATORY_CARE_PROVIDER_SITE_OTHER): Payer: Medicare Other | Admitting: Internal Medicine

## 2013-10-08 VITALS — BP 104/64 | HR 75 | Temp 97.5°F | Ht 61.5 in | Wt 233.0 lb

## 2013-10-08 DIAGNOSIS — D869 Sarcoidosis, unspecified: Secondary | ICD-10-CM

## 2013-10-08 DIAGNOSIS — J9610 Chronic respiratory failure, unspecified whether with hypoxia or hypercapnia: Secondary | ICD-10-CM

## 2013-10-08 DIAGNOSIS — I2789 Other specified pulmonary heart diseases: Secondary | ICD-10-CM

## 2013-10-08 DIAGNOSIS — I2729 Other secondary pulmonary hypertension: Secondary | ICD-10-CM

## 2013-10-08 NOTE — Patient Instructions (Signed)
Leave the purple plug out while sleeping with humidifed 02 collar in place  Prednisone 10 mg daily   Please schedule a follow up office visit in 6 weeks, call sooner if needed

## 2013-10-08 NOTE — Progress Notes (Signed)
Subjective:     Patient ID: Melody Gray, female   DOB: 09-08-1970   MRN: [id number]   Brief patient profile:  73 yobf  Smoker dx of sarcoidosis in 2001 = sob, cough became 02 dep in July [id number] complicated by [facility]  last on chronic  prednsione 01/2012      History of Present Illness   January 06, 2010 ov  c/o increased SOB. Pt states outpt clinic advised her to follow up with Dr. Melvyn Novas for Sacoidosis. doe on 02 at 2 lpm sitting then 4 with activity gets off at the curb struggles with grocery store. Try taking Prednisone one half even days in am with breakfast  Be sure you take omeprazole Take one 30-60 min before first and last meals of the day  Stop nifedipine  Start Cardizem 240 mg once daily  Wear 24 hours per day, 2 lpm at rest and sleeping, 4lpm with activity, this is the best way to help your heart function better  CT Chest ( repeated with contrast)> no PE, just sarcoid changes   February 09, 2010 Followup sarcoid w/ PFTs. Breathing is the same- no better or worse.on 02 2 lpm w/in and then outside using 4lpm> cc doe x 50 ft still has to stop every aisle at [employer] but not [facility] parking. rec Start Dulera 2 puffs first thing in am and 2 puffs again in pm about 12 hours later and prednisone 10 mg every other day.        07/24/2013 post hosp f/u ov/Dionysios Massman re: ? Sarcoid flare/ still smoking  Chief Complaint  Patient presents with  . HFU    Pt states that her breathing is no better since hospital d/c. She reports having to use her o2 all of the time. She has minimal non prod cough.   onset wasnot  abrupt, sob developed gradually  in setting of gen chest/ upper abd tight sensation anteriorly, sym bilaterally better lying down, not much cough all, no better back on prednisone rec Please see patient coordinator before you leave today  to schedule CTa chest> ? LL PE vs artifact > bialteral venous dopplers > neg / D Dimer nl  Treatment for your abdominal tightness  consists of avoiding foods  that cause gas (especially beans and raw vegetables like spinach and salads boiled eggs)  and citrucel 1 heaping tsp twice daily with a large glass of water.  Pain should improve w/in 2 weeks and if not then consider further GI work up.   Wear 02 4lpm 24/7 and stop all smoking  Prednisone 20 mg one daily x 3 days, half daily x 3 days and stop   07/31/2013 f/u ov/Jaythan Hinely re: last pred one day  prior to [place]  Patient presents with  . Follow-up    Pt reports her breathing is starting to improve. Still has minimal non prod cough. No new co's today.  did not start citrucel as rec and main c/o is abd tightness s change in bowel habits rec Wear 02 4lpm 24/7 and stop all smoking  Please remember to go to the lab   department downstairs for your tests - we will call you with the results when they are available. Make a food diary for the next two weeks and show it to your clinic doctor/ nutritionist    Admit date: 08/08/2013  Discharge date: 08/15/2013  Discharge Diagnoses:  Acute on Chronic Hypercarbic Respiratory Failure  Sarcoid  OSA/OHS  Emergent  Tracheostomy  Tobacco Abuse  Long standing non adherence to medical advice , see clinic records  Pulmonary Hypertension  Acute on Chronic dCHF  Hypotension  Acute on Chronic Renal Failure  Dysphagia  Hyperkalemia  Anemia  DM  Acute Encephalopathy  DISCHARGE PLAN BY DIAGNOSIS  Acute on Chronic Hypercarbic Respiratory Failure  Sarcoid  OSA/OHS  Emergent Tracheostomy  Tobacco Abuse  Long standing non adherence to medical advice , see clinic records  Pulmonary Hypertension  Discharge Plan:  -daytime ATC 40% as tolerated, PSV QHS  -prednisone taper - with wean to off over 6 weeks  -smoking cessaton  -Unfortunately expect some vocal cord/ thyroid cartilage damage here per ENT -long term effects on speech unclear, expect will need trach for a long time  -Speech following for PMV (not tol well)  -In regard to [facility], she has scarring  on her CXR. Further, before the trach she had OSA. All of these are good secondary causes of pulmonary hypertension and it's really not clear from the literature when patients with PH from sarcoid benefit from pulmonary vasodilators. Considering what she has been through recently I would prefer to treat the bronchospasm vs "sarcoid flare" (less likely) with a long course of steroids. If not returned to baseline functional status after steroids would set her up for a RHC.  -WOULD NOT DECANNULATE or DOWNSIZE at this time. We will follow for potential downsize.  Acute on Chronic dCHF  Hypotension  Discharge Plan:  -continue coreg  -hold previous home medications: Lasix 40 mg QD, diovan 40 mg QD, Avapro 300mg  QD  Acute on Chronic Renal Failure  Discharge Plan:  -trend BMP  Dysphagia  Discharge Plan:  -cleared by SLP for thin liquid, regular diet with chin tuck  -initiate heart healthy, carb modified diet  -PPI  -stop TF 19-Aug-2022 as pt has passed MBS with SLP, if PO intake is not sufficient, may need to consider supplementation with TF  Hyperkalemia  Discharge Plan:  -resolved, no further interventions  Anemia  Discharge Plan:  -minimize lab draws as able  -transfuse if Hgb <7%, active bleeding or MI <8%  DM  Discharge Plan:  -continue SSI + lantus  -hold home medications: Metformin, 70/30  -consider restart of home meds closer to d/c  Acute Encephalopathy  Discharge Plan:  -resolved, no further needs  -PT efforts  DISCHARGE SUMMARY  Melody Gray is a 43 y.o. y/o female, active smoker with a PMH of sarcoidosis (dx 2001 -followed by dr Melvyn Novas) on home O2 (since 2011), NICM, pulm HTN, OSA, CKD admitted 4/10 by IMTS with worsening SOB thought related to volume overload. Recently, her baseline prednisone had been stopped in March per Dr. Melvyn Novas. She was diuresed with some improvement and was ready for tx to floor. During floor admission, she was noted to have periods of apnea on bipap at night.  However, on 4/12 she declined with worsening renal function, hyperkalemia, anemia requiring transfusion, hypercarbia and placed on bipap with transfer to ICU. PCCM consulted for evaluation. She unfortunately did not tolerated BiPAP and had worsening of respiratory status in the setting of acute renal failure. ABG at that time: 7.1 / 95 / 74 / 34. Despite efforts of bipap, she required emergent intubation in the setting of hypercarbia & worsening mental status. PATIENT WAS A VERY DIFFICULT AIRWAY (she has extremely small mouth, large tongue, and very anterior larynx) - requiring efforts of CCS, Anesthesia, & CCM to obtain a temporary airway. She was then taken to  the OR on 4/12 PM for revision / placement of tracheostomy. Post tracheostomy placement, she was returned to ICU. Patient was able to transition to ATC during day time and nocturnal vent support at night. Thus far, she has not been successful with PSV at night. Has required PRVC.  In the setting of acute renal failure reltated to ARB + diuresis, her home medications were held and have not been restarted. Offending agents were held, patient was gently hydrated and sr cr has returned to normal.  She was evaluated by SLP for swallowing needs. Patient passed MBS on 4/17 and was cleared for regular consistency diet, thin liquids with chin tuck. Please see discharge plans as above for details.  For Sarcoid, would plan to treat with prednisone tapered from 40mg  down to off over 6 weeks.  Regarding tracheostomy, it is helping her OSA and given her difficult airway would not consider decannulation for minimum 3-4 weeks. May want to consider lifelong trach to assist with OSA.     6/10/2015post hosp  f/u ov/ transition of care/Melody Gray re: sarcoid / pred at 10 mg / ? Not smoking ? / now trached Chief Complaint  Patient presents with  . Follow-up    Pt states her breathing has improved. No new co's today.   did not understand purpose of PMV leaves it in 24/7  and not using T collar at hs  Edema has resolved   Her sob is s  obvious daytime variabilty or assoc cough or  pleurritic/ex cp or  subjective wheeze overt sinus or hb symptoms. No unusual exp hx or h/o childhood pna/ asthma or premature birth to her knowledge.    Sleeping ok on 02 /PMV without nocturnal  or early am exacerbation  of respiratory  c/o's or need for noct saba. Also denies any obvious fluctuation of symptoms with weather or environmental changes or other aggravating or alleviating factors except as outlined above   ROS  The following are not active complaints unless bolded sore throat, dysphagia, dental problems, itching, sneezing,  nasal congestion or excess/ purulent secretions, ear ache,   fever, chills, sweats, unintended wt loss, pleuritic or exertional cp, hemoptysis,  orthopnea pnd or leg swelling, presyncope, palpitations, heartburn, abdominal pain, anorexia, nausea, vomiting, diarrhea  or change in bowel or urinary habits, change in stools or urine, dysuria,hematuria,  rash, arthralgias, visual complaints, headache, numbness weakness or ataxia or problems with walking or coordination,  change in mood/affect or memory.         Past Medical History:  Sarcoidosis (Dr Keiran Sias)-w/ liver involvement per biopsy 03/2008 - Reversible airway component so start [facility] 01/2010 > better  - HFa 75% p coaching May 04, 2010  -  Off chronic prednisone 05/2011 Unexplained Hypoxemia July 2011  - CT angiogram 01/07/10 >>> no PE  - PFT's February 09, 2010 FEV1 1.20 (49%) with ratio 72 and FRC 90%, FEV1  16% better p B2, DLC0 33% > corrects to 84   Morbid obesity  - Target wt = 153 for BMI < 30  - Trach 08/10/13 with very difficult airway QT prolongation  Diabetes mellitus, type II  Hypertension  Heart catheterization 06-06-06 : No CAD, no RAS, normal EF  Hx eclampsia  Hx of scalp seborrheic dermatitis            Objective:   Physical Exam  She is a minimally  cushingoid-appearing  amb bf with trach in place no stridor and good phonation with PMV wt 238 January 06, 2010 >   217 May 04, 2010 >  02/12/2012  213 >  215 04/04/2012  > 239 04/08/2013 > 07/24/2013 249 > 07/31/2013  252 >   10/08/2013  233   HEENT: normocephalic; PEERLA; no nasal or pharyngeal abnormalities  neck: supple / trach site looks good / PMV in place nodes: no cervical lymphadenopathy  chest: clear to ausculatation and percussion though BS distant  heart: no murmurs, gallops, or rubs  abd: soft, nontender; BS normoactive; no abdominal masses, tenderness, organomegaly; abdomen is obese      cxr 08/17/13  Decreased conspicuity of the left lower lobe airspace disease.  Improved atelectasis right lung base  Resolving small left effusion      Assessment:

## 2013-10-10 ENCOUNTER — Ambulatory Visit (INDEPENDENT_AMBULATORY_CARE_PROVIDER_SITE_OTHER): Payer: Medicare Other | Admitting: Dietician

## 2013-10-10 ENCOUNTER — Encounter: Payer: Self-pay | Admitting: Dietician

## 2013-10-10 VITALS — Wt 240.4 lb

## 2013-10-10 DIAGNOSIS — E119 Type 2 diabetes mellitus without complications: Secondary | ICD-10-CM

## 2013-10-10 NOTE — Assessment & Plan Note (Signed)
-    Complicated by PAH - Walked 3 laps @ 185 ft each stopped due to  End of test, no desat on 4lpm 10/05/2010  - 02/02/2011  Walked RA  2 laps @ 185 ft each stopped due to  desat to 88 so change rx to 2lpm with activity outside the house and at hs - 08/21/2011  Walked RA x 1 laps @ 185 ft each stopped due to desat 85% - 11/21/2011  Walked RA  2 laps @ 185 ft each stopped due to  Sat 88 -   ONO RA < 89% x 6h 26 min 03/19/12 > repeat on 2lpm 04/05/12 > desats resolved - 04/08/2013   Walked RA x one lap @ 185 stopped due to  sats 85%  - HCO3 = 40 07/31/13 c/w hypercarbia - 08/10/13 Trach emergently with very difficult airway  Doing much better now that trached and off cigs even though not using the trach correctly  Reviewed instructions on how to take care of trach and use humdified 02 per collar at hs instead of PMV

## 2013-10-10 NOTE — Patient Instructions (Signed)
See you  next week.   Bring your meter- hopefully the new one.   Call me anytime with questions! Butch Penny  [phone]

## 2013-10-10 NOTE — Assessment & Plan Note (Addendum)
-   repeat echo 10/14/10 LV much worse than RV so rx as L Ht failure by cards/Crenshaw - CTa 07/25/13 ? Very small pe vs artifact on L > venous dopplers 07/28/2013 neg bilaterally  - Echo 08/10/13 The RV was dialted and severely hyponetic and there was a D-shaped septum suggestive of PAH with RV pressure volume overload.   Much better clinically, no edema with rx with trach/02 no cigs. Still concerned about occult PE risk here and low threshold to anticoagulate - will have Dr [place] Bells review but for now just continue full dose asa      Each maintenance medication was reviewed in detail including most importantly the difference between maintenance and as needed and under what circumstances the prns are to be used.  Please see instructions for details which were reviewed in writing and the patient given a copy.

## 2013-10-10 NOTE — Assessment & Plan Note (Signed)
-   PFT's 11/21/2011 FEV1 1.23 (50%) ratio 67 and no better p B2,  DLCO 31 corrects to 73   - trial off chronic prednisone 02/12/2012 > resumed 07/2013   Indication for prednisone not really clear at present. The goal with a chronic steroid dependent illness is always arriving at the lowest effective dose that controls the disease/symptoms and not accepting a set "formula" which is based on statistics or guidelines that don't always take into account patient  variability or the natural hx of the dz in every individual patient, which may well vary over time.  For now therefore I recommend the patient maintain  10 mg per now until regroup in 6 weeks

## 2013-10-10 NOTE — Progress Notes (Signed)
Follow-up visit:  MNT2 Last visit 10-01-2013 2nd visit in this series Medical Nutrition Therapy:  Appt start time: 1030 end time:  1100.  Assessment:  Primary concerns today: Weight management and Blood sugar control.  Patient came with daughter and friend.  Did not want to discuss food intake.  Weight has increased since last visit.  Pulmonary rehab was contacted about follow-up to pervious referral, patient is interested in attending "if it will help me breath better."  Patient reports better control- no hypoglycemia.  Patient is limited in exercise because she get tired and tries to save energy for cooking some days. MEDICATIONS levemir 26 units ~ 9 am, 20 units Levemir  ~ 5 PM, 10 mg prednisone qam, Novolog correction-~2-3 units/day on ave.  TDD is 0.4units/kg. Blood sugar: unable to download meter; checking blood sugar twice a day. Range 98-314.  Most are between 130-200mg /dL   Progress Towards Goal(s):  In progress.   Nutritional Diagnosis:  NB-1.1 Food and nutrition-related knowledge deficit as related to her desire to decrease her weight and knowing foods that are low in caloric density and more nutritious improing   as evidenced by her naming healthier choices NB-1.6 Limited adherence to nutrition-related recommendations As related to competing values of maintianing smoking cessation.  As evidenced by her report and having quit smoking.    Intervention:  Nutrition health coaching/continue to encourage weight loss and physical activity . Coordination of care with physician. Re-referral to pulmonary rehab and continue to encourage diabetes medication without side effect of weight gain   Monitoring/Evaluation:  Dietary intake, exercise, blood sugars/meter, and body weight. Follow-up 1 week.

## 2013-10-16 ENCOUNTER — Ambulatory Visit: Payer: Medicare Other | Admitting: Dietician

## 2013-10-17 ENCOUNTER — Encounter: Payer: Self-pay | Admitting: Internal Medicine

## 2013-10-22 ENCOUNTER — Encounter: Payer: Self-pay | Admitting: Dietician

## 2013-10-27 ENCOUNTER — Encounter (HOSPITAL_COMMUNITY): Payer: Self-pay | Admitting: Emergency Medicine

## 2013-10-27 ENCOUNTER — Inpatient Hospital Stay (HOSPITAL_COMMUNITY)
Admission: EM | Admit: 2013-10-27 | Discharge: 2013-11-04 | DRG: 189 | Disposition: A | Discharge status: Home Health | Payer: Medicare Other | Attending: Oncology | Admitting: Oncology

## 2013-10-27 ENCOUNTER — Emergency Department (HOSPITAL_COMMUNITY): Payer: Medicare Other

## 2013-10-27 ENCOUNTER — Ambulatory Visit: Payer: Medicare Other | Admitting: Dietician

## 2013-10-27 DIAGNOSIS — I2789 Other specified pulmonary heart diseases: Secondary | ICD-10-CM | POA: Diagnosis present

## 2013-10-27 DIAGNOSIS — IMO0002 Reserved for concepts with insufficient information to code with codable children: Secondary | ICD-10-CM | POA: Diagnosis not present

## 2013-10-27 DIAGNOSIS — Z430 Encounter for attention to tracheostomy: Secondary | ICD-10-CM

## 2013-10-27 DIAGNOSIS — D72829 Elevated white blood cell count, unspecified: Secondary | ICD-10-CM | POA: Diagnosis present

## 2013-10-27 DIAGNOSIS — R0602 Shortness of breath: Secondary | ICD-10-CM | POA: Diagnosis not present

## 2013-10-27 DIAGNOSIS — Z833 Family history of diabetes mellitus: Secondary | ICD-10-CM | POA: Diagnosis not present

## 2013-10-27 DIAGNOSIS — J9620 Acute and chronic respiratory failure, unspecified whether with hypoxia or hypercapnia: Secondary | ICD-10-CM | POA: Diagnosis not present

## 2013-10-27 DIAGNOSIS — R0902 Hypoxemia: Secondary | ICD-10-CM

## 2013-10-27 DIAGNOSIS — E1139 Type 2 diabetes mellitus with other diabetic ophthalmic complication: Secondary | ICD-10-CM | POA: Diagnosis present

## 2013-10-27 DIAGNOSIS — I5031 Acute diastolic (congestive) heart failure: Secondary | ICD-10-CM

## 2013-10-27 DIAGNOSIS — Z794 Long term (current) use of insulin: Secondary | ICD-10-CM | POA: Diagnosis not present

## 2013-10-27 DIAGNOSIS — Z87898 Personal history of other specified conditions: Secondary | ICD-10-CM | POA: Diagnosis present

## 2013-10-27 DIAGNOSIS — D638 Anemia in other chronic diseases classified elsewhere: Secondary | ICD-10-CM | POA: Diagnosis present

## 2013-10-27 DIAGNOSIS — N179 Acute kidney failure, unspecified: Secondary | ICD-10-CM | POA: Diagnosis present

## 2013-10-27 DIAGNOSIS — J9503 Malfunction of tracheostomy stoma: Secondary | ICD-10-CM | POA: Diagnosis present

## 2013-10-27 DIAGNOSIS — Z9981 Dependence on supplemental oxygen: Secondary | ICD-10-CM | POA: Diagnosis not present

## 2013-10-27 DIAGNOSIS — I379 Nonrheumatic pulmonary valve disorder, unspecified: Secondary | ICD-10-CM | POA: Diagnosis not present

## 2013-10-27 DIAGNOSIS — J9621 Acute and chronic respiratory failure with hypoxia: Secondary | ICD-10-CM

## 2013-10-27 DIAGNOSIS — E119 Type 2 diabetes mellitus without complications: Secondary | ICD-10-CM | POA: Diagnosis present

## 2013-10-27 DIAGNOSIS — E662 Morbid (severe) obesity with alveolar hypoventilation: Secondary | ICD-10-CM | POA: Diagnosis present

## 2013-10-27 DIAGNOSIS — D869 Sarcoidosis, unspecified: Secondary | ICD-10-CM | POA: Diagnosis present

## 2013-10-27 DIAGNOSIS — I10 Essential (primary) hypertension: Secondary | ICD-10-CM | POA: Diagnosis not present

## 2013-10-27 DIAGNOSIS — Z930 Tracheostomy status: Secondary | ICD-10-CM

## 2013-10-27 DIAGNOSIS — N039 Chronic nephritic syndrome with unspecified morphologic changes: Secondary | ICD-10-CM

## 2013-10-27 DIAGNOSIS — I2729 Other secondary pulmonary hypertension: Secondary | ICD-10-CM

## 2013-10-27 DIAGNOSIS — E785 Hyperlipidemia, unspecified: Secondary | ICD-10-CM | POA: Diagnosis present

## 2013-10-27 DIAGNOSIS — Z87891 Personal history of nicotine dependence: Secondary | ICD-10-CM

## 2013-10-27 DIAGNOSIS — I5030 Unspecified diastolic (congestive) heart failure: Secondary | ICD-10-CM | POA: Diagnosis not present

## 2013-10-27 DIAGNOSIS — J811 Chronic pulmonary edema: Secondary | ICD-10-CM | POA: Diagnosis not present

## 2013-10-27 DIAGNOSIS — E11319 Type 2 diabetes mellitus with unspecified diabetic retinopathy without macular edema: Secondary | ICD-10-CM | POA: Diagnosis present

## 2013-10-27 DIAGNOSIS — Z4682 Encounter for fitting and adjustment of non-vascular catheter: Secondary | ICD-10-CM | POA: Diagnosis not present

## 2013-10-27 DIAGNOSIS — K219 Gastro-esophageal reflux disease without esophagitis: Secondary | ICD-10-CM | POA: Diagnosis present

## 2013-10-27 DIAGNOSIS — D509 Iron deficiency anemia, unspecified: Secondary | ICD-10-CM | POA: Diagnosis present

## 2013-10-27 DIAGNOSIS — I5032 Chronic diastolic (congestive) heart failure: Secondary | ICD-10-CM | POA: Diagnosis present

## 2013-10-27 DIAGNOSIS — D631 Anemia in chronic kidney disease: Secondary | ICD-10-CM

## 2013-10-27 DIAGNOSIS — Z6841 Body Mass Index (BMI) 40.0 and over, adult: Secondary | ICD-10-CM | POA: Diagnosis not present

## 2013-10-27 DIAGNOSIS — R918 Other nonspecific abnormal finding of lung field: Secondary | ICD-10-CM | POA: Diagnosis not present

## 2013-10-27 DIAGNOSIS — I428 Other cardiomyopathies: Secondary | ICD-10-CM | POA: Diagnosis not present

## 2013-10-27 DIAGNOSIS — G4733 Obstructive sleep apnea (adult) (pediatric): Secondary | ICD-10-CM | POA: Diagnosis present

## 2013-10-27 DIAGNOSIS — I5033 Acute on chronic diastolic (congestive) heart failure: Secondary | ICD-10-CM | POA: Diagnosis not present

## 2013-10-27 DIAGNOSIS — I509 Heart failure, unspecified: Secondary | ICD-10-CM | POA: Diagnosis present

## 2013-10-27 DIAGNOSIS — J9819 Other pulmonary collapse: Secondary | ICD-10-CM | POA: Diagnosis not present

## 2013-10-27 DIAGNOSIS — I279 Pulmonary heart disease, unspecified: Secondary | ICD-10-CM | POA: Diagnosis present

## 2013-10-27 LAB — BASIC METABOLIC PANEL
BUN: 24 mg/dL — ABNORMAL HIGH (ref 6–23)
CO2: 27 mEq/L (ref 19–32)
Calcium: 8.7 mg/dL (ref 8.4–10.5)
Chloride: 98 mEq/L (ref 96–112)
Creatinine, Ser: 1.28 mg/dL — ABNORMAL HIGH (ref 0.50–1.10)
GFR calc Af Amer: 58 mL/min — ABNORMAL LOW (ref >90)
GFR calc non Af Amer: 50 mL/min — ABNORMAL LOW (ref >90)
Glucose, Bld: 242 mg/dL — ABNORMAL HIGH (ref 70–99)
Potassium: 4.6 mEq/L (ref 3.7–5.3)
Sodium: 138 mEq/L (ref 137–147)

## 2013-10-27 LAB — I-STAT TROPONIN, ED: Troponin i, poc: 0.01 ng/mL (ref 0.00–0.08)

## 2013-10-27 LAB — I-STAT ARTERIAL BLOOD GAS, ED
Acid-Base Excess: 5 mmol/L — ABNORMAL HIGH (ref 0.0–2.0)
Bicarbonate: 30 mEq/L — ABNORMAL HIGH (ref 20.0–24.0)
O2 Saturation: 76 %
Patient temperature: 99.1
TCO2: 31 mmol/L (ref 0–100)
pCO2 arterial: 43.5 mmHg (ref 35.0–45.0)
pH, Arterial: 7.447 (ref 7.350–7.450)
pO2, Arterial: 40 mmHg — ABNORMAL LOW (ref 80.0–100.0)

## 2013-10-27 LAB — CBC
HCT: 41.3 % (ref 36.0–46.0)
Hemoglobin: 12.5 g/dL (ref 12.0–15.0)
MCH: 28 pg (ref 26.0–34.0)
MCHC: 30.3 g/dL (ref 30.0–36.0)
MCV: 92.6 fL (ref 78.0–100.0)
Platelets: 245 10*3/uL (ref 150–400)
RBC: 4.46 MIL/uL (ref 3.87–5.11)
RDW: 16.2 % — ABNORMAL HIGH (ref 11.5–15.5)
WBC: 13.8 10*3/uL — ABNORMAL HIGH (ref 4.0–10.5)

## 2013-10-27 LAB — HEPATIC FUNCTION PANEL
ALT: 14 U/L (ref 0–35)
AST: 16 U/L (ref 0–37)
Albumin: 2.9 g/dL — ABNORMAL LOW (ref 3.5–5.2)
Alkaline Phosphatase: 130 U/L — ABNORMAL HIGH (ref 39–117)
Bilirubin, Direct: <0.2 mg/dL (ref 0.0–0.3)
Total Bilirubin: 0.5 mg/dL (ref 0.3–1.2)
Total Protein: 7.3 g/dL (ref 6.0–8.3)

## 2013-10-27 LAB — PRO B NATRIURETIC PEPTIDE: Pro B Natriuretic peptide (BNP): 2615 pg/mL — ABNORMAL HIGH (ref 0–125)

## 2013-10-27 LAB — GLUCOSE, CAPILLARY
Glucose-Capillary: 159 mg/dL — ABNORMAL HIGH (ref 70–99)
Glucose-Capillary: 219 mg/dL — ABNORMAL HIGH (ref 70–99)

## 2013-10-27 MED ORDER — FUROSEMIDE 10 MG/ML IJ SOLN
40.0000 mg | Freq: Once | INTRAMUSCULAR | Status: AC
  Administered 2013-10-27: 40 mg via INTRAVENOUS
  Filled 2013-10-27: qty 4

## 2013-10-27 MED ORDER — SODIUM CHLORIDE 0.9 % IV SOLN: 250.0000 mL | INTRAVENOUS | Status: DC | PRN

## 2013-10-27 MED ORDER — INSULIN ASPART 100 UNIT/ML ~~LOC~~ SOLN
2.0000 [IU] | SUBCUTANEOUS | Status: DC
  Administered 2013-10-27 – 2013-10-28 (×2): 6 [IU] via SUBCUTANEOUS
  Administered 2013-10-28: 2 [IU] via SUBCUTANEOUS
  Administered 2013-10-28 (×2): 4 [IU] via SUBCUTANEOUS
  Administered 2013-10-28: 6 [IU] via SUBCUTANEOUS

## 2013-10-27 MED ORDER — IPRATROPIUM-ALBUTEROL 0.5-2.5 (3) MG/3ML IN SOLN: 3.0000 mL | RESPIRATORY_TRACT | Status: DC | PRN

## 2013-10-27 MED ORDER — HYDROCORTISONE NA SUCCINATE PF 100 MG IJ SOLR
50.0000 mg | Freq: Four times a day (QID) | INTRAMUSCULAR | Status: DC
  Administered 2013-10-27 – 2013-10-31 (×15): 50 mg via INTRAVENOUS
  Filled 2013-10-27 (×20): qty 1

## 2013-10-27 MED ORDER — FUROSEMIDE 10 MG/ML IJ SOLN
40.0000 mg | Freq: Once | INTRAMUSCULAR | Status: AC
  Administered 2013-10-28: 40 mg via INTRAVENOUS
  Filled 2013-10-27: qty 4

## 2013-10-27 MED ORDER — PANTOPRAZOLE SODIUM 40 MG IV SOLR
40.0000 mg | Freq: Two times a day (BID) | INTRAVENOUS | Status: DC
  Administered 2013-10-27 – 2013-10-29 (×4): 40 mg via INTRAVENOUS
  Filled 2013-10-27 (×6): qty 40

## 2013-10-27 MED ORDER — ACETAMINOPHEN 325 MG PO TABS
650.0000 mg | ORAL_TABLET | ORAL | Status: DC | PRN
  Administered 2013-10-27 – 2013-10-31 (×6): 650 mg via ORAL
  Filled 2013-10-27 (×6): qty 2

## 2013-10-27 MED ORDER — HEPARIN SODIUM (PORCINE) 5000 UNIT/ML IJ SOLN
5000.0000 [IU] | Freq: Three times a day (TID) | INTRAMUSCULAR | Status: DC
  Administered 2013-10-27 – 2013-11-04 (×23): 5000 [IU] via SUBCUTANEOUS
  Filled 2013-10-27 (×26): qty 1

## 2013-10-27 NOTE — ED Notes (Signed)
Dr. Wilburn Cornelia and Respiratory therapist at bedside to change tracheostomy.

## 2013-10-27 NOTE — Progress Notes (Signed)
   ENT Progress Note: Pt s/p trach by Dr. Janace Hoard for OSA and respiratory failure #6 shiley trach in place and stable since 07/2013    Subjective: Adm for increased sx of SOB and cough  Objective: Vital signs in last 24 hours: Temp:  [98.3 F (36.8 C)-99.1 F (37.3 C)] 98.8 F (37.1 C) (06/29 1906) Pulse Rate:  [89-100] 97 (06/29 1915) Resp:  [16-24] 18 (06/29 1915) BP: (103-123)/(47-95) 123/82 mmHg (06/29 1915) SpO2:  [90 %-100 %] 93 % (06/29 1915) FiO2 (%):  [98 %] 98 % (06/29 1906) Weight change:     Intake/Output from previous day:   Intake/Output this shift:    Labs:  Recent Labs  10/27/13 1643  WBC 13.8*  HGB 12.5  HCT 41.3  PLT 245    Recent Labs  10/27/13 1643  NA 138  K 4.6  CL 98  CO2 27  GLUCOSE 242*  BUN 24*  CALCIUM 8.7    Studies/Results: Dg Chest Port 1 View  10/27/2013   CLINICAL DATA:  Shortness of breath and cough with low oxygen saturation values ; history of sarcoidosis and CHF and tracheostomy placement  EXAM: PORTABLE CHEST - 1 VIEW  COMPARISON:  Portable chest x-ray of August 18, 2013  FINDINGS: The lungs are well-expanded and exhibit diffusely increased interstitial markings greater on the . The cardiopericardial silhouette is enlarged and the pulmonary vascularity is engorged. The tracheostomy appliance tip lies 5 cm above the crotch of the carina. The bony thorax is unremarkable.  IMPRESSION: Congestive heart failure with pulmonary interstitial and alveolar edema. Superimposed pneumonia is not excluded.   Electronically Signed   By: David  [place]   On: 10/27/2013 16:51     PHYSICAL EXAM: #6 cuffed trach placed without difficulty Airway stable, no bleeding   Assessment/Plan: Pt tol trach change Plan Adm and vent/critical care per CCM When medically stable pt may be changed back to Uncuffed trach Please reconsult Dr. Janace Hoard if further concerns or for f/u as op.   [place], DAVID 10/27/2013, 7:26 PM

## 2013-10-27 NOTE — ED Notes (Signed)
Pt is a trach pt. Reports getting out of hospital in early April and was getting around fine at home, wears home o2 at all times. Having productive cough and sob x 1 week, coughing up blood tinged sputum, spo2 76%.

## 2013-10-27 NOTE — Progress Notes (Signed)
eLink Physician-Brief Progress Note Patient Name: Melody Gray DOB: 31-Jan-1971 MRN: [id number]  Date of Service  10/27/2013   HPI/Events of Note   Pain around trach site since new trach placed, pt requesting tylenol  eICU Interventions  Po tylenol, clear liquids also approved as cuff not inflated, no need for vent at present    Intervention Category Minor Interventions: Routine modifications to care plan (e.g. PRN medications for pain, fever)  Christinia Gully 10/27/2013, 8:59 PM

## 2013-10-27 NOTE — Progress Notes (Signed)
Patient brought to 2S12.  No apparent complications.  Placed on 80% trach collar.  Sats 96-97%.  RT will continue to monitor.

## 2013-10-27 NOTE — Progress Notes (Signed)
eLink Physician-Brief Progress Note Patient Name: Melody Gray DOB: Sep 23, 1970 MRN: [id number]  Date of Service  10/27/2013   HPI/Events of Note  ohs and chf/ decompensated and changed to cuff trach in case vent needed    eICU Interventions  Monitor sats, response to lasix, place on vent if needed    Intervention Category Major Interventions: Respiratory failure - evaluation and management  Christinia Gully 10/27/2013, 8:50 PM

## 2013-10-27 NOTE — Progress Notes (Signed)
Patients sats are 97% at this time. Vitals stable. Will assess again later to decrease O2 at later time.

## 2013-10-27 NOTE — ED Notes (Signed)
Respiratory at bedside to attempt Trach change at this time.

## 2013-10-27 NOTE — H&P (Signed)
PULMONARY / CRITICAL CARE MEDICINE   Name: Melody Gray MRN: [id number] DOB: [birth year]    ADMISSION DATE:  10/27/2013 CONSULTATION DATE:  10/27/2013  REFERRING MD :  EDP PRIMARY SERVICE: PCCM  CHIEF COMPLAINT:  Hypoxemic Respiratory Failure  BRIEF PATIENT DESCRIPTION: 43 y.o. F with trach (from 07/2013 due to difficult intubation), diastolic CHF presents to ED with SOB.  In ED, found to be in hypoxemic respiratory failure with acute pulmonary edema.  Had been on 2L [place] at home.  Placed on ATC in ED, cuffless trach changed to cuffed by ENT in ED.  PCCM consulted for admission.   SIGNIFICANT EVENTS / STUDIES:  07/2013 - trached by Dr. Janace Hoard due to difficult intubation. 6/29 - admitted with hypoxemic respiratory failure.   LINES / TUBES: [name] 07/2013 >>>  CULTURES: None  ANTIBIOTICS: None  HISTORY OF PRESENT ILLNESS:  Melody Gray is a 43 y.o. With multiple medical co-morbidities as outlined below including tracheostomy in April 2015 by ENT due to difficult intubation (presented with OSA and resp failure at that time). She presented to [facility] ED on 6/29 with SOB x 3 - 4 days that had progressively worsened since onset.  In ED, she was found to be hypoxic with SpO2 in 70's.  ABG was obtained which revealed hypoxemic respiratory failure (pO2 40).  She was placed on trach collar with improvement in SpO2 to 97%.  CXR obtained revealed pulmonary edema.  ENT was subsequently consulted to exchange cuffless trach for cuffed trach in anticipation of pt needing to be ventilated due to her pulmonary edema / resp failure. PCCM was consulted for admission.   PAST MEDICAL HISTORY :  Past Medical History  Diagnosis Date  . Sarcoidosis     Followed by Dr. Melvyn Novas; w/ liver involvement per biopsy 12/09, Reversible airway component so started on [facility] 01/2010; HFA 75% p coaching 05/2010  . Hypoxemia     CT angiogram 9/11>> No PE; PFTs 10/11- FEV1 1.20 (49%) with 16% better p B2, DLCO 33%> corrects to 84; O2  sats ok on 4 lpm X rapid walk X 3 laps 05/2010  . Morbid obesity     Target wt= 153 for BMI <30  . QT prolongation   . Diabetes mellitus   . Hypertension   . Hx of cardiac cath 2/08    No CAD, no RAS,  normal EF  . Seborrheic dermatitis of scalp   . 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 Hep B serology neg. but Hep C ab +, HIV neg. 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 Cardiomyopathy, nonischemic     EF 45% 12/10; Echo 7/11 normal EF, PAS 48  . Diabetic retinopathy     Right eye 2/11  . Health maintenance examination     Mammogram 05/2010 Negative; Last Pap smear 03/2008; Last DM eye exam 2/11> mild non-proliferative diabetic retinopathy. OD  . Helicobacter pylori ab+ 05/2011    Pt was symptomatic and treatment planned for 05/2011  . CHF (congestive heart failure)    Past Surgical History  Procedure Laterality Date  . Tubal ligation    . Breast surgery    . Cesarean section    . Tracheostomy tube placement N/A 08/10/2013    Procedure: TRACHEOSTOMY;  Surgeon: Melissa Montane, MD;  Location: [place];  Service: ENT;  Laterality: N/A;   Prior to Admission medications   Medication  Sig Start Date End Date Taking? Authorizing Provider  ACCU-CHEK FASTCLIX LANCETS MISC Check blood sugar 3 times a day as instructed 10/01/13   Jerene Pitch, MD  acetaminophen (TYLENOL) 325 MG tablet Take 2 tablets (650 mg total) by mouth every 4 (four) hours as needed for fever or mild pain. 08/15/13   Donita Brooks, NP  amLODipine (NORVASC) 5 MG tablet Take 1 tablet (5 mg total) by mouth daily.    Jerene Pitch, MD  antiseptic oral rinse (BIOTENE) LIQD 15 mLs by Mouth Rinse route QID. 08/15/13   Donita Brooks, NP  carvedilol (COREG) 25 MG tablet take 1 tablet by mouth twice a day with food 10/03/13   Lelon Perla, MD  ferrous sulfate 325 (65 FE) MG tablet Take 1 tablet (325 mg total) by mouth 3 (three) times daily with meals.    Jerene Pitch, MD  furosemide (LASIX) 40 MG tablet Take 1 tablet (40 mg total) by mouth daily. 09/09/13 09/09/14  Jerene Pitch, MD  glucose blood (ACCU-CHEK SMARTVIEW) test strip Check blood sugar 3 times a day as instructed 10/01/13   Jerene Pitch, MD  insulin aspart (NOVOLOG) 100 UNIT/ML injection CBG < 70: implement hypoglycemia protocol CBG 70 - 130: 0 units CBG 131 - 180: 0 unit CBG 181-240: 2 units CBG 241-300 4 units  CBG 301-350 6 units CBG 351-400 8 units 09/29/13   Jerene Pitch, MD  insulin detemir (LEVEMIR) 100 UNIT/ML injection 26u in AM and 20u in PM 10/01/13   Jerene Pitch, MD  irbesartan (AVAPRO) 150 MG tablet Take 1 tablet (150 mg total) by mouth daily. 09/16/13 09/16/14  Othella Boyer, MD  Melatonin 3 MG TABS Take 3 mg by mouth at bedtime.    Historical Provider, MD  metFORMIN (GLUCOPHAGE) 1000 MG tablet Take 1,000 mg by mouth 2 (two) times daily with a meal.    Historical Provider, MD  pantoprazole (PROTONIX) 40 MG tablet Take 40 mg by mouth 2 (two) times daily.    Historical Provider, MD  predniSONE (DELTASONE) 10 MG tablet Take 1 tablet (10 mg total) by mouth daily with breakfast. 10/06/13   Tanda Rockers, MD   Allergies  Allergen Reactions  . Vicodin [Hydrocodone-Acetaminophen] Itching    FAMILY HISTORY:  Family History  Problem Relation Age of Onset  . Cancer Mother     colon cancer  . Multiple sclerosis Father   . Asthma Sister     in childhood  . Diabetes Father   . Hypertension     SOCIAL HISTORY:  reports that she has quit smoking. She has never used smokeless tobacco. She reports that she does not drink alcohol or use illicit drugs.  REVIEW OF SYSTEMS:  All negative; except for those that are bolded, which indicate positives.  Constitutional: weight loss, weight gain, night sweats, fevers, chills, fatigue, weakness.  HEENT: headaches, sore throat, sneezing, nasal congestion, post nasal drip, difficulty swallowing, tooth/dental problems, visual complaints,  visual changes, ear aches. Neuro: difficulty with speech, weakness, numbness, ataxia. CV:  chest pain, orthopnea, PND, swelling in lower extremities, dizziness, palpitations, syncope.  Resp: cough, hemoptysis, dyspnea, wheezing. GI  heartburn, indigestion, abdominal pain, nausea, vomiting, diarrhea, constipation, change in bowel habits, loss of appetite, hematemesis, melena, hematochezia.  GU: dysuria, change in color of urine, urgency or frequency, flank pain, hematuria. MSK: joint pain or swelling, decreased range of motion. Psych: change in mood or affect, depression, anxiety, suicidal ideations, homicidal ideations. Skin: rash, itching, bruising.  SUBJECTIVE:   Breathing much improved on ATC.  Denies chest pain, SOB, fevers/chills/sweats.  VITAL SIGNS: Temp:  [98.3 F (36.8 C)-99.1 F (37.3 C)] 98.8 F (37.1 C) (06/29 1906) Pulse Rate:  [89-100] 94 (06/29 1945) Resp:  [16-24] 21 (06/29 1945) BP: (103-123)/(47-95) 114/80 mmHg (06/29 1945) SpO2:  [90 %-100 %] 94 % (06/29 1945) FiO2 (%):  [98 %] 98 % (06/29 1918) HEMODYNAMICS:   VENTILATOR SETTINGS: Vent Mode:  [-]  FiO2 (%):  [98 %] 98 % INTAKE / OUTPUT: Intake/Output   None     PHYSICAL EXAMINATION: General: Pleasant female, morbidly obese, in NAD. Neuro: A&O x 3, non-focal.  HEENT: [place]/AT. PERRL, sclerae anicteric. Trach in place on ATC. Cardiovascular: RRR, no M/R/G.  Lungs: Respirations even and unlabored.  CTA anteriorally, No W/R/R appreciated with respect to body habitus. Abdomen: Obese, BS x 4, soft, NT/ND.  Musculoskeletal: No gross deformities, 1+ edema.  Skin: Intact, warm, no rashes.   LABS:  CBC  Recent Labs Lab 10/27/13 1643  WBC 13.8*  HGB 12.5  HCT 41.3  PLT 245   Coag's No results found for this basename: APTT, INR,  in the last 168 hours BMET  Recent Labs Lab 10/27/13 1643  NA 138  K 4.6  CL 98  CO2 27  BUN 24*  CREATININE 1.28*  GLUCOSE 242*   Electrolytes  Recent Labs Lab  10/27/13 1643  CALCIUM 8.7   Sepsis Markers No results found for this basename: LATICACIDVEN, PROCALCITON, O2SATVEN,  in the last 168 hours ABG  Recent Labs Lab 10/27/13 1808  PHART 7.447  PCO2ART 43.5  PO2ART 40.0*   Liver Enzymes  Recent Labs Lab 10/27/13 1643  AST 16  ALT 14  ALKPHOS 130*  BILITOT 0.5  ALBUMIN 2.9*   Cardiac Enzymes  Recent Labs Lab 10/27/13 1643  PROBNP 2615.0*   Glucose No results found for this basename: GLUCAP,  in the last 168 hours  Imaging Dg Chest Port 1 View  10/27/2013   CLINICAL DATA:  Shortness of breath and cough with low oxygen saturation values ; history of sarcoidosis and CHF and tracheostomy placement  EXAM: PORTABLE CHEST - 1 VIEW  COMPARISON:  Portable chest x-ray of August 18, 2013  FINDINGS: The lungs are well-expanded and exhibit diffusely increased interstitial markings greater on the . The cardiopericardial silhouette is enlarged and the pulmonary vascularity is engorged. The tracheostomy appliance tip lies 5 cm above the crotch of the carina. The bony thorax is unremarkable.  IMPRESSION: Congestive heart failure with pulmonary interstitial and alveolar edema. Superimposed pneumonia is not excluded.   Electronically Signed   By: David  Martinique   On: 10/27/2013 16:51     CXR: significant bilateral opacities, suspicious for pulmonary edema.   ASSESSMENT / PLAN:  PULMONARY A: Acute on chronic respiratory failure on home O2 Pulmonary Edema secondary to CHF H/o Sarcoidosis with hepatic involvement, on chronic prednisone Tracheostomy status trach changed to cuffed by ENT in ED 6/29  P:   Supplemental O2 via ATC May need mechanical ventilation  Lasix 40mg  one more dose. Re-eval 6/30 AM.  Continued diuresis as renal function allows.  PRN BD's Follow CXR/ABG  CARDIOVASCULAR A:  Acute of chronic CHF H/o QT prolongation, HTN Cor pulmonale -D shaped septum on echo 07/2013, Ct angio neg PE 06/2013 P:  F/u TTE KVO  IVF Diuresis as above Hold home PO antihypertensives for now (norvasc, coreg, irbesartan) Strict I&O Rpt echo  RENAL A:   Acute Kidney Injury  baseline creat 0.75  P:   Gentle diuresis as above Follow BMP  GASTROINTESTINAL A:   H/o GERD  P:   Continue home PPI NPO  HEMATOLOGIC A:   Leukocytosis  P:  Follow CBC  INFECTIOUS A:  Leukocytosis    P:   Monitor off ABX for now Trend WBC and fever curve.   ENDOCRINE A:   Chronic steroid use DM  P:   Stress dose steroids > solu-cortef 50mg  q6 CBG and SSI Hold metformin  NEUROLOGIC A:   No acute issues  P:   Monitor  Georgann Housekeeper, ACNP [place] [facility] Pager [phone] or [phone]   I have personally obtained a history, examined the patient, evaluated laboratory and imaging results, formulated the assessment and plan and placed orders. CRITICAL CARE: The patient is critically ill with multiple organ systems failure and requires high complexity decision making for assessment and support, frequent evaluation and titration of therapies, application of advanced monitoring technologies and extensive interpretation of multiple databases. Critical Care Time devoted to patient care services described in this note is 50 minutes.   Kara Mead MD. Shade Flood. [place] [facility] Pager [phone] If no response call [phone]

## 2013-10-27 NOTE — ED Notes (Signed)
Xray, EDP, ED resident at bedside

## 2013-10-27 NOTE — ED Notes (Signed)
Pt desat to 80% on 5L [place]. Respiratory and EDP at bedside to reassess at this time. PT alert and mentating well, in NAD.

## 2013-10-27 NOTE — ED Notes (Signed)
Respiratory unable to change trach at this time. EDP aware.

## 2013-10-27 NOTE — ED Notes (Addendum)
Pt tolerated trach change well, SpO2 93% on trach collar. Positive color change on CO2 detector. RT suctioning patient at this time.

## 2013-10-27 NOTE — Progress Notes (Signed)
RT assisted Dr. Wilburn Cornelia with trach change to a #6 Shiley Cuffed trach. Good color change noted on CO2 detector by RT and RN. Bilateral breath sounds were coarse. Patient tolerated well.

## 2013-10-27 NOTE — Progress Notes (Addendum)
MD instructed to put patient on Positive Pressure Ventilation. RT attempted (via ventilator) with uncuffed trach due to patients anatomy, however, the leak was to big and unable to ventilate patient. MD ordered to change trach to a cuffed #6 Shiley.

## 2013-10-27 NOTE — Progress Notes (Signed)
Critical ABG results given to MD and advised to start trach collar. Patients sats were 80% when trach collar initiated. Patient is resting at this time, with daughter at bedside.

## 2013-10-27 NOTE — ED Notes (Signed)
Respiratory contacted to assist in deep suctioning.

## 2013-10-27 NOTE — ED Notes (Signed)
Respiratory at bedside.

## 2013-10-27 NOTE — ED Notes (Signed)
Admitting at bedside 

## 2013-10-27 NOTE — ED Provider Notes (Signed)
CSN: [id number]     Arrival date & time 10/27/13  1536 History   First MD Initiated Contact with Patient 10/27/13 1632     Chief Complaint  Patient presents with  . Shortness of Breath     (Consider location/radiation/quality/duration/timing/severity/associated sxs/prior Treatment) Patient is a 43 y.o. female presenting with shortness of breath. The history is provided by the patient and medical records.  Shortness of Breath Severity:  Moderate Onset quality:  Sudden Timing:  Constant Progression:  Unchanged Chronicity:  New Relieved by:  Rest Worsened by:  Exertion Associated symptoms: abdominal pain (tightness), cough and sputum production   Associated symptoms: no chest pain, no fever, no headaches and no vomiting     43 yo F pw SOB. H/o sarcoid, CHF, DM pw SOB. Cough with occasional blood tinged sputum x1 week. SOB since yesterday. New trach ~1 month ago. Mild epigastric tightness today. Has had previously. No CP. No edema.  No abd pain, n/v.  States trach was for cpap. Uses 2L [place] during the day. Mask over trach at nighttime.  cuffless trach.      Past Medical History  Diagnosis Date  . Sarcoidosis     Followed by Dr. Melvyn Novas; w/ liver involvement per biopsy 12/09, Reversible airway component so started on [facility] 01/2010; HFA 75% p coaching 05/2010  . Hypoxemia     CT angiogram 9/11>> No PE; PFTs 10/11- FEV1 1.20 (49%) with 16% better p B2, DLCO 33%> corrects to 84; O2 sats ok on 4 lpm X rapid walk X 3 laps 05/2010  . Morbid obesity     Target wt= 153 for BMI <30  . QT prolongation   . Diabetes mellitus   . Hypertension   . Hx of cardiac cath 2/08    No CAD, no RAS,  normal EF  . Seborrheic dermatitis of scalp   . 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 Hep B serology neg. but Hep C ab +, HIV neg. 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 Cardiomyopathy, nonischemic     EF 45% 12/10; Echo  7/11 normal EF, PAS 48  . Diabetic retinopathy     Right eye 2/11  . Health maintenance examination     Mammogram 05/2010 Negative; Last Pap smear 03/2008; Last DM eye exam 2/11> mild non-proliferative diabetic retinopathy. OD  . Helicobacter pylori ab+ 05/2011    Pt was symptomatic and treatment planned for 05/2011  . CHF (congestive heart failure)    Past Surgical History  Procedure Laterality Date  . Tubal ligation    . Breast surgery    . Cesarean section    . Tracheostomy tube placement N/A 08/10/2013    Procedure: TRACHEOSTOMY;  Surgeon: Melissa Montane, MD;  Location: [facility] OR;  Service: ENT;  Laterality: N/A;   Family History  Problem Relation Age of Onset  . Cancer Mother     colon cancer  . Multiple sclerosis Father   . Asthma Sister     in childhood  . Diabetes Father   . Hypertension     History  Substance Use Topics  . Smoking status: Former Smoker -- 0.30 packs/day for 20 years  . Smokeless tobacco: Never Used     Comment: Needs to cut back.  . Alcohol Use: No   OB History   Grav Para Term Preterm Abortions TAB SAB Ect Mult Living  Review of Systems  Constitutional: Negative for fever and chills.  HENT: Positive for congestion. Negative for rhinorrhea.   Eyes: Negative for pain.  Respiratory: Positive for cough, sputum production and shortness of breath.   Cardiovascular: Negative for chest pain and leg swelling.  Gastrointestinal: Positive for abdominal pain (tightness). Negative for vomiting and diarrhea.  Genitourinary: Negative for flank pain and difficulty urinating.  Neurological: Negative for dizziness and headaches.  All other systems reviewed and are negative.     Allergies  Vicodin  Home Medications   Prior to Admission medications   Medication Sig Start Date End Date Taking? Authorizing Provider  ACCU-CHEK FASTCLIX LANCETS MISC Check blood sugar 3 times a day as instructed 10/01/13   Jerene Pitch, MD  acetaminophen (TYLENOL) 325  MG tablet Take 2 tablets (650 mg total) by mouth every 4 (four) hours as needed for fever or mild pain. 08/15/13   Donita Brooks, NP  amLODipine (NORVASC) 5 MG tablet Take 1 tablet (5 mg total) by mouth daily.    Jerene Pitch, MD  antiseptic oral rinse (BIOTENE) LIQD 15 mLs by Mouth Rinse route QID. 08/15/13   Donita Brooks, NP  carvedilol (COREG) 25 MG tablet take 1 tablet by mouth twice a day with food 10/03/13   Lelon Perla, MD  ferrous sulfate 325 (65 FE) MG tablet Take 1 tablet (325 mg total) by mouth 3 (three) times daily with meals.    Jerene Pitch, MD  furosemide (LASIX) 40 MG tablet Take 1 tablet (40 mg total) by mouth daily. 09/09/13 09/09/14  Jerene Pitch, MD  glucose blood (ACCU-CHEK SMARTVIEW) test strip Check blood sugar 3 times a day as instructed 10/01/13   Jerene Pitch, MD  insulin aspart (NOVOLOG) 100 UNIT/ML injection CBG < 70: implement hypoglycemia protocol CBG 70 - 130: 0 units CBG 131 - 180: 0 unit CBG 181-240: 2 units CBG 241-300 4 units  CBG 301-350 6 units CBG 351-400 8 units 09/29/13   Jerene Pitch, MD  insulin detemir (LEVEMIR) 100 UNIT/ML injection 26u in AM and 20u in PM 10/01/13   Jerene Pitch, MD  irbesartan (AVAPRO) 150 MG tablet Take 1 tablet (150 mg total) by mouth daily. 09/16/13 09/16/14  Othella Boyer, MD  Melatonin 3 MG TABS Take 3 mg by mouth at bedtime.    Historical Provider, MD  metFORMIN (GLUCOPHAGE) 1000 MG tablet Take 1,000 mg by mouth 2 (two) times daily with a meal.    Historical Provider, MD  pantoprazole (PROTONIX) 40 MG tablet Take 40 mg by mouth 2 (two) times daily.    Historical Provider, MD  predniSONE (DELTASONE) 10 MG tablet Take 1 tablet (10 mg total) by mouth daily with breakfast. 10/06/13   Tanda Rockers, MD   BP 114/68  Pulse 93  Temp(Src) 99.1 F (37.3 C) (Oral)  Resp 23  SpO2 92% Physical Exam  Nursing note and vitals reviewed. Constitutional: She is oriented to person, place, and time. She appears well-developed and  well-nourished. No distress.  Laying in bed. Speaks in full sentences.  HENT:  Head: Normocephalic and atraumatic.  Eyes: Conjunctivae are normal. Right eye exhibits no discharge. Left eye exhibits no discharge.  Neck:  Trach in place.  Cardiovascular: Normal rate, regular rhythm, normal heart sounds and intact distal pulses.   Pulmonary/Chest: No stridor. No respiratory distress. She has no wheezes.  Abdominal: Soft. She exhibits no distension. There is no tenderness. There is no guarding.  Musculoskeletal: She exhibits no edema  and no tenderness.  Neurological: She is alert and oriented to person, place, and time.  Skin: Skin is warm and dry.  Psychiatric: She has a normal mood and affect. Her behavior is normal.    ED Course  Procedures (including critical care time) Labs Review Labs Reviewed  [place], ED    Imaging Review No results found.   EKG Interpretation None      MDM   Final diagnoses:  None    43 yo F pw sob. Hypoxic. Improved with [place]. However, subsequently dropped again. CXR with volume overload. Unable to place on positive pressure ventilation as patient has cuffless trach. RT attempted to change unsuccessful. ENT consulted and changed out trach.  Patient given lasix IV.  No fevers.  ICU consulted for admission.   Labs and imaging reviewed by myself and considered in medical decision making if ordered. Imaging interpreted by radiology.   Discussed case with Dr. Kathrynn Humble who is in agreement with assessment and plan.      Bonnita Hollow, MD 10/28/13 [phone]

## 2013-10-27 NOTE — Progress Notes (Signed)
RT attempted to change trach from a #6 Shiley cuffless to a #6 Shiley cuffed unsuccessfully times 2. Small bleeding occurred. #6 Shiley cuffless was reinserted and secured back in place. Patient vitals are stable. HR 88, RR 19, and Sats 95%. MD was notified.

## 2013-10-28 ENCOUNTER — Inpatient Hospital Stay (HOSPITAL_COMMUNITY): Payer: Medicare Other

## 2013-10-28 DIAGNOSIS — I379 Nonrheumatic pulmonary valve disorder, unspecified: Secondary | ICD-10-CM

## 2013-10-28 DIAGNOSIS — Z930 Tracheostomy status: Secondary | ICD-10-CM

## 2013-10-28 LAB — BASIC METABOLIC PANEL
BUN: 24 mg/dL — ABNORMAL HIGH (ref 6–23)
CO2: 33 mEq/L — ABNORMAL HIGH (ref 19–32)
Calcium: 8.9 mg/dL (ref 8.4–10.5)
Chloride: 98 mEq/L (ref 96–112)
Creatinine, Ser: 1.27 mg/dL — ABNORMAL HIGH (ref 0.50–1.10)
GFR calc Af Amer: 59 mL/min — ABNORMAL LOW (ref >90)
GFR calc non Af Amer: 51 mL/min — ABNORMAL LOW (ref >90)
Glucose, Bld: 142 mg/dL — ABNORMAL HIGH (ref 70–99)
Potassium: 4.8 mEq/L (ref 3.7–5.3)
Sodium: 141 mEq/L (ref 137–147)

## 2013-10-28 LAB — CBC
HCT: 38.8 % (ref 36.0–46.0)
Hemoglobin: 11.7 g/dL — ABNORMAL LOW (ref 12.0–15.0)
MCH: 27.8 pg (ref 26.0–34.0)
MCHC: 30.2 g/dL (ref 30.0–36.0)
MCV: 92.2 fL (ref 78.0–100.0)
Platelets: 236 10*3/uL (ref 150–400)
RBC: 4.21 MIL/uL (ref 3.87–5.11)
RDW: 16 % — ABNORMAL HIGH (ref 11.5–15.5)
WBC: 13.8 10*3/uL — ABNORMAL HIGH (ref 4.0–10.5)

## 2013-10-28 LAB — GLUCOSE, CAPILLARY
Glucose-Capillary: 139 mg/dL — ABNORMAL HIGH (ref 70–99)
Glucose-Capillary: 155 mg/dL — ABNORMAL HIGH (ref 70–99)
Glucose-Capillary: 210 mg/dL — ABNORMAL HIGH (ref 70–99)
Glucose-Capillary: 285 mg/dL — ABNORMAL HIGH (ref 70–99)
Glucose-Capillary: 298 mg/dL — ABNORMAL HIGH (ref 70–99)

## 2013-10-28 LAB — MRSA PCR SCREENING: MRSA by PCR: NEGATIVE

## 2013-10-28 LAB — MAGNESIUM: Magnesium: 1.7 mg/dL (ref 1.5–2.5)

## 2013-10-28 LAB — PHOSPHORUS: Phosphorus: 5.3 mg/dL — ABNORMAL HIGH (ref 2.3–4.6)

## 2013-10-28 MED ORDER — INSULIN GLARGINE 100 UNIT/ML ~~LOC~~ SOLN
10.0000 [IU] | Freq: Every day | SUBCUTANEOUS | Status: DC
  Administered 2013-10-28 – 2013-11-03 (×7): 10 [IU] via SUBCUTANEOUS
  Filled 2013-10-28 (×8): qty 0.1

## 2013-10-28 MED ORDER — BIOTENE DRY MOUTH MT LIQD
15.0000 mL | Freq: Two times a day (BID) | OROMUCOSAL | Status: DC
  Administered 2013-10-28 – 2013-11-03 (×13): 15 mL via OROMUCOSAL

## 2013-10-28 MED ORDER — INSULIN ASPART 100 UNIT/ML ~~LOC~~ SOLN
0.0000 [IU] | SUBCUTANEOUS | Status: DC
  Administered 2013-10-28 – 2013-10-29 (×2): 11 [IU] via SUBCUTANEOUS
  Administered 2013-10-29: 4 [IU] via SUBCUTANEOUS
  Administered 2013-10-29 (×2): 7 [IU] via SUBCUTANEOUS
  Administered 2013-10-29 – 2013-10-30 (×5): 4 [IU] via SUBCUTANEOUS
  Administered 2013-10-30: 11 [IU] via SUBCUTANEOUS
  Administered 2013-10-30: 7 [IU] via SUBCUTANEOUS
  Administered 2013-10-30: 15 [IU] via SUBCUTANEOUS
  Administered 2013-10-31: 1 [IU] via SUBCUTANEOUS
  Administered 2013-10-31: 4 [IU] via SUBCUTANEOUS
  Administered 2013-10-31: 7 [IU] via SUBCUTANEOUS
  Administered 2013-10-31: 11 [IU] via SUBCUTANEOUS
  Administered 2013-10-31: 4 [IU] via SUBCUTANEOUS
  Administered 2013-10-31: 15 [IU] via SUBCUTANEOUS
  Administered 2013-11-01: 11 [IU] via SUBCUTANEOUS
  Administered 2013-11-01: 4 [IU] via SUBCUTANEOUS
  Administered 2013-11-01: 15 [IU] via SUBCUTANEOUS
  Administered 2013-11-01 – 2013-11-02 (×4): 4 [IU] via SUBCUTANEOUS
  Administered 2013-11-02: 7 [IU] via SUBCUTANEOUS
  Administered 2013-11-02: 11 [IU] via SUBCUTANEOUS
  Administered 2013-11-02: 7 [IU] via SUBCUTANEOUS
  Administered 2013-11-02: 4 [IU] via SUBCUTANEOUS
  Administered 2013-11-02: 11 [IU] via SUBCUTANEOUS
  Administered 2013-11-02 – 2013-11-03 (×2): 15 [IU] via SUBCUTANEOUS
  Administered 2013-11-03: 3 [IU] via SUBCUTANEOUS
  Administered 2013-11-03: 15 [IU] via SUBCUTANEOUS
  Administered 2013-11-03 (×2): 11 [IU] via SUBCUTANEOUS
  Administered 2013-11-03: 4 [IU] via SUBCUTANEOUS
  Administered 2013-11-04: 7 [IU] via SUBCUTANEOUS
  Administered 2013-11-04: 4 [IU] via SUBCUTANEOUS

## 2013-10-28 MED ORDER — FUROSEMIDE 10 MG/ML IJ SOLN
40.0000 mg | Freq: Four times a day (QID) | INTRAMUSCULAR | Status: AC
  Administered 2013-10-28 (×3): 40 mg via INTRAVENOUS
  Filled 2013-10-28 (×3): qty 4

## 2013-10-28 MED ORDER — CHLORHEXIDINE GLUCONATE 0.12 % MT SOLN
15.0000 mL | Freq: Two times a day (BID) | OROMUCOSAL | Status: DC
  Administered 2013-10-28 – 2013-11-04 (×15): 15 mL via OROMUCOSAL
  Filled 2013-10-28 (×18): qty 15

## 2013-10-28 MED ORDER — INSULIN GLARGINE 100 UNIT/ML ~~LOC~~ SOLN
5.0000 [IU] | Freq: Once | SUBCUTANEOUS | Status: AC
  Administered 2013-10-28: 5 [IU] via SUBCUTANEOUS
  Filled 2013-10-28 (×2): qty 0.05

## 2013-10-28 NOTE — Progress Notes (Signed)
  Echocardiogram 2D Echocardiogram has been performed.  Mauricio Po 10/28/2013, 3:33 PM

## 2013-10-28 NOTE — Progress Notes (Signed)
PULMONARY / CRITICAL CARE MEDICINE   Name: Melody Gray MRN: [id number] DOB: 09-20-1970    ADMISSION DATE:  10/27/2013 CONSULTATION DATE:  10/27/2013  REFERRING MD :  EDP PRIMARY SERVICE: PCCM  CHIEF COMPLAINT:  Hypoxemic Respiratory Failure  BRIEF PATIENT DESCRIPTION: 43 y.o. F with trach (from 07/2013 due to difficult intubation), diastolic CHF presents to ED with SOB.  In ED, found to be in hypoxemic respiratory failure with acute pulmonary edema.  Had been on 2L [place] at home.  Placed on ATC in ED, cuffless trach changed to cuffed by ENT in ED.  PCCM consulted for admission.   SIGNIFICANT EVENTS / STUDIES:  07/2013 - trached by Dr. Janace Hoard due to difficult intubation. 6/29 - admitted with hypoxemic respiratory failure.   LINES / TUBES: [name] 07/2013 >>>  CULTURES: None  ANTIBIOTICS: None  SUBJECTIVE:   Remains hypoxic but no new complaints, titrating O2 down as able.  VITAL SIGNS: Temp:  [97.8 F (36.6 C)-99.1 F (37.3 C)] 98.1 F (36.7 C) (06/30 0800) Pulse Rate:  [78-100] 85 (06/30 0800) Resp:  [12-24] 12 (06/30 0800) BP: (103-124)/(47-95) 103/65 mmHg (06/30 0806) SpO2:  [90 %-100 %] 98 % (06/30 0800) FiO2 (%):  [60 %-98 %] 60 % (06/30 0806) Weight:  [234 lb 9.1 oz (106.4 kg)-234 lb 12.6 oz (106.5 kg)] 234 lb 9.1 oz (106.4 kg) (06/30 0600) HEMODYNAMICS:   VENTILATOR SETTINGS: Vent Mode:  [-]  FiO2 (%):  [60 %-98 %] 60 % INTAKE / OUTPUT: Intake/Output     06/29 0701 - 06/30 0700 06/30 0701 - 07/01 0700   Urine (mL/kg/hr) 1050    Total Output 1050     Net -1050           PHYSICAL EXAMINATION: General: Pleasant female, morbidly obese, in NAD. Neuro: A&O x 3, non-focal.  HEENT: [place]/AT. PERRL, sclerae anicteric. Trach in place on ATC. Cardiovascular: RRR, no M/R/G.  Lungs: Breathing labored with diffuse crackles and distant lung sounds. Abdomen: Obese, BS x 4, soft, NT/ND.  Musculoskeletal: No gross deformities, 1+ edema.  Skin: Intact, warm, no  rashes.  LABS:  CBC  Recent Labs Lab 10/27/13 1643 10/28/13 0330  WBC 13.8* 13.8*  HGB 12.5 11.7*  HCT 41.3 38.8  PLT 245 236   Coag's No results found for this basename: APTT, INR,  in the last 168 hours BMET  Recent Labs Lab 10/27/13 1643 10/28/13 0330  NA 138 141  K 4.6 4.8  CL 98 98  CO2 27 33*  BUN 24* 24*  CREATININE 1.28* 1.27*  GLUCOSE 242* 142*   Electrolytes  Recent Labs Lab 10/27/13 1643 10/28/13 0330  CALCIUM 8.7 8.9  MG  --  1.7  PHOS  --  5.3*   Sepsis Markers No results found for this basename: LATICACIDVEN, PROCALCITON, O2SATVEN,  in the last 168 hours ABG  Recent Labs Lab 10/27/13 1808  PHART 7.447  PCO2ART 43.5  PO2ART 40.0*   Liver Enzymes  Recent Labs Lab 10/27/13 1643  AST 16  ALT 14  ALKPHOS 130*  BILITOT 0.5  ALBUMIN 2.9*   Cardiac Enzymes  Recent Labs Lab 10/27/13 1643  PROBNP 2615.0*   Glucose  Recent Labs Lab 10/27/13 2054 10/27/13 2346 10/28/13 0341 10/28/13 0810  GLUCAP 219* 159* 139* 155*    Imaging Dg Chest Port 1 View  10/28/2013   CLINICAL DATA:  Congestive heart failure and edema  EXAM: PORTABLE CHEST - 1 VIEW  COMPARISON:  10/27/2013  FINDINGS: Tracheostomy in good  position. Diffuse bilateral airspace disease unchanged and most consistent with edema. Bibasilar atelectasis also unchanged.  IMPRESSION: No significant change bilateral pulmonary edema and bibasilar atelectasis.   Electronically Signed   By: Franchot Gallo M.D.   On: 10/28/2013 07:57   Dg Chest Port 1 View  10/27/2013   CLINICAL DATA:  Shortness of breath and cough with low oxygen saturation values ; history of sarcoidosis and CHF and tracheostomy placement  EXAM: PORTABLE CHEST - 1 VIEW  COMPARISON:  Portable chest x-ray of August 18, 2013  FINDINGS: The lungs are well-expanded and exhibit diffusely increased interstitial markings greater on the . The cardiopericardial silhouette is enlarged and the pulmonary vascularity is engorged.  The tracheostomy appliance tip lies 5 cm above the crotch of the carina. The bony thorax is unremarkable.  IMPRESSION: Congestive heart failure with pulmonary interstitial and alveolar edema. Superimposed pneumonia is not excluded.   Electronically Signed   By: David  Martinique   On: 10/27/2013 16:51     CXR: significant bilateral opacities, suspicious for pulmonary edema.   ASSESSMENT / PLAN:  PULMONARY A: Acute on chronic respiratory failure on home O2 Pulmonary Edema secondary to CHF H/o Sarcoidosis with hepatic involvement, on chronic prednisone Tracheostomy status trach changed to cuffed by ENT in ED 6/29  P:   - Supplemental O2 via ATC - May still need mechanical ventilation given WOB but will watch for now - Diureses as below. - PRN BD's. - Follow CXR/ABG.  CARDIOVASCULAR A:  Acute of chronic CHF H/o QT prolongation, HTN Cor pulmonale -D shaped septum on echo 07/2013, Ct angio neg PE 06/2013 P:  - F/u TTE - KVO IVF - Diuresis as below - Hold home PO antihypertensives for now (norvasc, coreg, irbesartan) - Strict I&O  RENAL A:   Acute Kidney Injury baseline creat 0.75  P:   - Lasix 40 mg IV q6 x3 doses. - Replace electrolytes as needed. - Follow BMP.  GASTROINTESTINAL A:   H/o GERD  P:   - Continue home PPI. - Advance diet as tolerated.  HEMATOLOGIC A:   Leukocytosis  P:  - Follow CBC  INFECTIOUS A:  Leukocytosis    P:   - Monitor off ABX for now - Trend WBC and fever curve.   ENDOCRINE A:   Chronic steroid use DM  P:   - Stress dose steroids > solu-cortef 50mg  q6 - CBG and SSI - Hold metformin  NEUROLOGIC A:   No acute issues  P:   - Monitor  Will hold in the ICU overnight while diuresing and in case needs mechanical ventilation, diurese aggressively, titrate O2 down.  I have personally obtained a history, examined the patient, evaluated laboratory and imaging results, formulated the assessment and plan and placed  orders.  CRITICAL CARE: The patient is critically ill with multiple organ systems failure and requires high complexity decision making for assessment and support, frequent evaluation and titration of therapies, application of advanced monitoring technologies and extensive interpretation of multiple databases. Critical Care Time devoted to patient care services described in this note is 35 minutes.   Rush Farmer, M.D. [facility] Pulmonary/Critical Care Medicine. Pager: [phone]. After hours pager: [phone].

## 2013-10-29 LAB — PHOSPHORUS: Phosphorus: 3.1 mg/dL (ref 2.3–4.6)

## 2013-10-29 LAB — GLUCOSE, CAPILLARY
Glucose-Capillary: 163 mg/dL — ABNORMAL HIGH (ref 70–99)
Glucose-Capillary: 165 mg/dL — ABNORMAL HIGH (ref 70–99)
Glucose-Capillary: 169 mg/dL — ABNORMAL HIGH (ref 70–99)
Glucose-Capillary: 181 mg/dL — ABNORMAL HIGH (ref 70–99)
Glucose-Capillary: 214 mg/dL — ABNORMAL HIGH (ref 70–99)
Glucose-Capillary: 280 mg/dL — ABNORMAL HIGH (ref 70–99)
Glucose-Capillary: 294 mg/dL — ABNORMAL HIGH (ref 70–99)

## 2013-10-29 LAB — BASIC METABOLIC PANEL
BUN: 24 mg/dL — ABNORMAL HIGH (ref 6–23)
CO2: 34 mEq/L — ABNORMAL HIGH (ref 19–32)
Calcium: 9.1 mg/dL (ref 8.4–10.5)
Chloride: 94 mEq/L — ABNORMAL LOW (ref 96–112)
Creatinine, Ser: 1.06 mg/dL (ref 0.50–1.10)
GFR calc Af Amer: 73 mL/min — ABNORMAL LOW (ref >90)
GFR calc non Af Amer: 63 mL/min — ABNORMAL LOW (ref >90)
Glucose, Bld: 186 mg/dL — ABNORMAL HIGH (ref 70–99)
Potassium: 4.8 mEq/L (ref 3.7–5.3)
Sodium: 139 mEq/L (ref 137–147)

## 2013-10-29 LAB — POCT I-STAT 3, ART BLOOD GAS (G3+)
Bicarbonate: 25.4 mEq/L — ABNORMAL HIGH (ref 20.0–24.0)
O2 Saturation: 95 %
Patient temperature: 98.6
TCO2: 27 mmol/L (ref 0–100)
pCO2 arterial: 44.4 mmHg (ref 35.0–45.0)
pH, Arterial: 7.366 (ref 7.350–7.450)
pO2, Arterial: 80 mmHg (ref 80.0–100.0)

## 2013-10-29 LAB — CBC
HCT: 41.3 % (ref 36.0–46.0)
Hemoglobin: 12.5 g/dL (ref 12.0–15.0)
MCH: 28 pg (ref 26.0–34.0)
MCHC: 30.3 g/dL (ref 30.0–36.0)
MCV: 92.6 fL (ref 78.0–100.0)
Platelets: 288 10*3/uL (ref 150–400)
RBC: 4.46 MIL/uL (ref 3.87–5.11)
RDW: 15.6 % — ABNORMAL HIGH (ref 11.5–15.5)
WBC: 13.8 10*3/uL — ABNORMAL HIGH (ref 4.0–10.5)

## 2013-10-29 LAB — MAGNESIUM: Magnesium: 1.7 mg/dL (ref 1.5–2.5)

## 2013-10-29 MED ORDER — CARVEDILOL 3.125 MG PO TABS
3.1250 mg | ORAL_TABLET | Freq: Two times a day (BID) | ORAL | Status: DC
  Administered 2013-10-29 – 2013-11-04 (×13): 3.125 mg via ORAL
  Filled 2013-10-29 (×15): qty 1

## 2013-10-29 MED ORDER — PANTOPRAZOLE SODIUM 40 MG PO TBEC
40.0000 mg | DELAYED_RELEASE_TABLET | Freq: Two times a day (BID) | ORAL | Status: DC
  Administered 2013-10-29 – 2013-11-04 (×12): 40 mg via ORAL
  Filled 2013-10-29 (×12): qty 1

## 2013-10-29 MED ORDER — PANTOPRAZOLE SODIUM 40 MG PO PACK
40.0000 mg | PACK | Freq: Two times a day (BID) | ORAL | Status: DC
  Filled 2013-10-29: qty 20

## 2013-10-29 MED ORDER — FUROSEMIDE 10 MG/ML IJ SOLN
40.0000 mg | Freq: Three times a day (TID) | INTRAMUSCULAR | Status: AC
  Administered 2013-10-29 (×2): 40 mg via INTRAVENOUS
  Filled 2013-10-29 (×2): qty 4

## 2013-10-29 NOTE — Progress Notes (Signed)
Visit to patient while in hospital. Will call after patient is discharged to assist with transition of care. 

## 2013-10-29 NOTE — Progress Notes (Signed)
Inpatient Diabetes Program Recommendations  AACE/ADA: New Consensus Statement on Inpatient Glycemic Control (2013)  Target Ranges:  Prepandial:   less than 140 mg/dL      Peak postprandial:   less than 180 mg/dL (1-2 hours)      Critically ill patients:  140 - 180 mg/dL   Reason for Assessment:  Results for CAMARON, DEBERNARDI (MRN [id number]) as of 10/29/2013 10:03  Ref. Range 10/28/2013 15:16 10/28/2013 19:31 10/28/2013 23:32 10/29/2013 03:39 10/29/2013 07:31  Glucose-Capillary Latest Range: 70-99 mg/dL 294 (H) 298 (H) 210 (H) 165 (H) 169 (H)   Diabetes history: Type 2 Diabetes Outpatient Diabetes medications: Levemir 26 units in the AM and 20 units q HS, Novolog correction Current orders for Inpatient glycemic control: Lantus 10 units q HS, Novolog-ICU Glycemic control protocol 2-4-6 q 4 hours.  Please d/c ICU glycemic control protocol since patient is now eating and start "Glycemic control" order set.  May consider discontinuation of Lantus and restart 1/2 of home dose of Levemir. Consider Levemir 13 units q AM and 10 units q HS.  Also may consider Novolog 4 units tid with meals-meal coverage and Novolog moderate correction.     Thanks, Adah Perl, RN, BC-ADM Inpatient Diabetes Coordinator Pager [phone]

## 2013-10-29 NOTE — Progress Notes (Signed)
Received transfer call from Dr. Nelda Marseille.  IMTS to resume care for patient tomorrow morning 10/30/13.   Signed: Jerene Pitch, MD PGY-2, Internal Medicine Resident Pager: [phone]  10/29/2013,9:56 PM

## 2013-10-29 NOTE — Progress Notes (Signed)
PULMONARY / CRITICAL CARE MEDICINE   Name: Melody Gray MRN: [id number] DOB: 04-Apr-1971    ADMISSION DATE:  10/27/2013 CONSULTATION DATE:  10/27/2013  REFERRING MD :  EDP PRIMARY SERVICE: PCCM  CHIEF COMPLAINT:  Hypoxemic Respiratory Failure  BRIEF PATIENT DESCRIPTION: 43 y.o. F with trach (from 07/2013 due to difficult intubation), diastolic CHF presents to ED with SOB.  In ED, found to be in hypoxemic respiratory failure with acute pulmonary edema.  Had been on 2L [place] at home.  Placed on ATC in ED, cuffless trach changed to cuffed by ENT in ED.  PCCM consulted for admission.   SIGNIFICANT EVENTS / STUDIES:  07/2013 - trached by Dr. Janace Hoard due to difficult intubation. 6/29 - admitted with hypoxemic respiratory failure.   LINES / TUBES: [name] 07/2013 >>>  CULTURES: None  ANTIBIOTICS: None  SUBJECTIVE: Hypoxia resolved with diureses.  VITAL SIGNS: Temp:  [97.9 F (36.6 C)-98.6 F (37 C)] 98 F (36.7 C) (07/01 0732) Pulse Rate:  [73-112] 98 (07/01 1000) Resp:  [10-24] 16 (07/01 1000) BP: (98-142)/(50-89) 139/80 mmHg (07/01 1000) SpO2:  [85 %-100 %] 100 % (07/01 1000) FiO2 (%):  [35 %-40 %] 40 % (07/01 1000) Weight:  [235 lb 3.7 oz (106.7 kg)] 235 lb 3.7 oz (106.7 kg) (07/01 0400) HEMODYNAMICS:   VENTILATOR SETTINGS: Vent Mode:  [-]  FiO2 (%):  [35 %-40 %] 40 % INTAKE / OUTPUT: Intake/Output     06/30 0701 - 07/01 0700 07/01 0701 - 07/02 0700   P.O. 960 180   I.V. (mL/kg) 20 (0.2)    Other 10    Total Intake(mL/kg) 990 (9.3) 180 (1.7)   Urine (mL/kg/hr) 2025 (0.8)    Total Output 2025     Net -1035 +180        Urine Occurrence 1 x    Stool Occurrence 2 x     PHYSICAL EXAMINATION: General: Pleasant female, morbidly obese, in NAD. Neuro: A&O x 3, non-focal.  HEENT: [place]/AT. PERRL, sclerae anicteric. Trach in place on ATC. Cardiovascular: RRR, no M/R/G.  Lungs: Breathing labored with diffuse crackles and distant lung sounds. Abdomen: Obese, BS x 4, soft, NT/ND.   Musculoskeletal: No gross deformities, 1+ edema.  Skin: Intact, warm, no rashes.  LABS:  CBC  Recent Labs Lab 10/27/13 1643 10/28/13 0330 10/29/13 0328  WBC 13.8* 13.8* 13.8*  HGB 12.5 11.7* 12.5  HCT 41.3 38.8 41.3  PLT 245 236 288   Coag's No results found for this basename: APTT, INR,  in the last 168 hours BMET  Recent Labs Lab 10/27/13 1643 10/28/13 0330 10/29/13 0328  NA 138 141 139  K 4.6 4.8 4.8  CL 98 98 94*  CO2 27 33* 34*  BUN 24* 24* 24*  CREATININE 1.28* 1.27* 1.06  GLUCOSE 242* 142* 186*   Electrolytes  Recent Labs Lab 10/27/13 1643 10/28/13 0330 10/29/13 0328  CALCIUM 8.7 8.9 9.1  MG  --  1.7 1.7  PHOS  --  5.3* 3.1   Sepsis Markers No results found for this basename: LATICACIDVEN, PROCALCITON, O2SATVEN,  in the last 168 hours ABG  Recent Labs Lab 10/27/13 1808  PHART 7.447  PCO2ART 43.5  PO2ART 40.0*   Liver Enzymes  Recent Labs Lab 10/27/13 1643  AST 16  ALT 14  ALKPHOS 130*  BILITOT 0.5  ALBUMIN 2.9*   Cardiac Enzymes  Recent Labs Lab 10/27/13 1643  PROBNP 2615.0*   Glucose  Recent Labs Lab 10/28/13 1244 10/28/13 1516 10/28/13 1931  10/28/13 2332 10/29/13 0339 10/29/13 0731  GLUCAP 285* 294* 298* 210* 165* 169*    Imaging Dg Chest Port 1 View  10/28/2013   CLINICAL DATA:  Congestive heart failure and edema  EXAM: PORTABLE CHEST - 1 VIEW  COMPARISON:  10/27/2013  FINDINGS: Tracheostomy in good position. Diffuse bilateral airspace disease unchanged and most consistent with edema. Bibasilar atelectasis also unchanged.  IMPRESSION: No significant change bilateral pulmonary edema and bibasilar atelectasis.   Electronically Signed   By: Franchot Gallo M.D.   On: 10/28/2013 07:57   Dg Chest Port 1 View  10/27/2013   CLINICAL DATA:  Shortness of breath and cough with low oxygen saturation values ; history of sarcoidosis and CHF and tracheostomy placement  EXAM: PORTABLE CHEST - 1 VIEW  COMPARISON:  Portable chest  x-ray of August 18, 2013  FINDINGS: The lungs are well-expanded and exhibit diffusely increased interstitial markings greater on the . The cardiopericardial silhouette is enlarged and the pulmonary vascularity is engorged. The tracheostomy appliance tip lies 5 cm above the crotch of the carina. The bony thorax is unremarkable.  IMPRESSION: Congestive heart failure with pulmonary interstitial and alveolar edema. Superimposed pneumonia is not excluded.   Electronically Signed   By: David  Martinique   On: 10/27/2013 16:51     CXR: significant bilateral opacities, suspicious for pulmonary edema.   ASSESSMENT / PLAN:  PULMONARY A: Acute on chronic respiratory failure on home O2 Pulmonary Edema secondary to CHF H/o Sarcoidosis with hepatic involvement, on chronic prednisone Tracheostomy status trach changed to cuffed by ENT in ED 6/29  P:   - Supplemental O2 via ATC - Keep cuffed trach until able to get O2 demand down to 28% then may call ENT to change to cuffless 6, this was a very difficult trach per ENT and neither RT or PCCM should be changing this tracheostomy. - Diureses as below. - PRN BD's. - Follow CXR/ABG.  CARDIOVASCULAR A:  Acute of chronic CHF H/o QT prolongation, HTN Cor pulmonale -D shaped septum on echo 07/2013, Ct angio neg PE 06/2013 P:  - F/u TTE - KVO IVF - Diuresis as below - Home PO antihypertensives (norvasc, coreg, irbesartan), will restart coreg at low dose today. - Strict I&O  RENAL A:   Acute Kidney Injury baseline creat 0.75, improved.  P:   - Lasix 40 mg IV q8 x2 doses. - Replace electrolytes as needed. - Follow BMP.  GASTROINTESTINAL A:   H/o GERD  P:   - Continue home PPI. - Advance diet as tolerated.  HEMATOLOGIC A:   Leukocytosis  P:  - Follow CBC  INFECTIOUS A:  Leukocytosis    P:   - Monitor off ABX for now - Trend WBC and fever curve.   ENDOCRINE A:   Chronic steroid use DM  P:   - Stress dose steroids > solu-cortef 50mg   q6, chronically on steroids, may change back to PO in AM if remains hemodynamically stable. - CBG and SSI - Hold metformin  NEUROLOGIC A:   No acute issues  P:   - Monitor  Transfer to tele today, transfer care to [facility], PCCM will sign off, please call back if needed.  Goal is to get her down to 28% FiO2 then have ENT change her trach to cuffless 6 and transition her to PO steroids prior to discharge.  I have personally obtained a history, examined the patient, evaluated laboratory and imaging results, formulated the assessment and plan and placed orders.  Rush Farmer, M.D. [facility] Pulmonary/Critical Care Medicine. Pager: [phone]. After hours pager: [phone].

## 2013-10-30 ENCOUNTER — Ambulatory Visit: Payer: Medicare Other | Admitting: Physician Assistant

## 2013-10-30 ENCOUNTER — Inpatient Hospital Stay (HOSPITAL_COMMUNITY): Payer: Medicare Other

## 2013-10-30 LAB — GLUCOSE, CAPILLARY
Glucose-Capillary: 123 mg/dL — ABNORMAL HIGH (ref 70–99)
Glucose-Capillary: 172 mg/dL — ABNORMAL HIGH (ref 70–99)
Glucose-Capillary: 158 mg/dL — ABNORMAL HIGH (ref 70–99)
Glucose-Capillary: 249 mg/dL — ABNORMAL HIGH (ref 70–99)
Glucose-Capillary: 301 mg/dL — ABNORMAL HIGH (ref 70–99)
Glucose-Capillary: 253 mg/dL — ABNORMAL HIGH (ref 70–99)

## 2013-10-30 LAB — CBC
HCT: 39.5 % (ref 36.0–46.0)
Hemoglobin: 11.8 g/dL — ABNORMAL LOW (ref 12.0–15.0)
MCH: 27.3 pg (ref 26.0–34.0)
MCHC: 29.9 g/dL — ABNORMAL LOW (ref 30.0–36.0)
MCV: 91.4 fL (ref 78.0–100.0)
Platelets: 235 10*3/uL (ref 150–400)
RBC: 4.32 MIL/uL (ref 3.87–5.11)
RDW: 15.4 % (ref 11.5–15.5)
WBC: 13.8 10*3/uL — ABNORMAL HIGH (ref 4.0–10.5)

## 2013-10-30 LAB — BASIC METABOLIC PANEL
Anion gap: 10 (ref 5–15)
BUN: 20 mg/dL (ref 6–23)
CO2: 36 mEq/L — ABNORMAL HIGH (ref 19–32)
Calcium: 9.2 mg/dL (ref 8.4–10.5)
Chloride: 94 mEq/L — ABNORMAL LOW (ref 96–112)
Creatinine, Ser: 0.88 mg/dL (ref 0.50–1.10)
GFR calc Af Amer: >90 mL/min (ref >90)
GFR calc non Af Amer: 79 mL/min — ABNORMAL LOW (ref >90)
Glucose, Bld: 147 mg/dL — ABNORMAL HIGH (ref 70–99)
Potassium: 3.9 mEq/L (ref 3.7–5.3)
Sodium: 140 mEq/L (ref 137–147)

## 2013-10-30 LAB — MAGNESIUM: Magnesium: 1.6 mg/dL (ref 1.5–2.5)

## 2013-10-30 LAB — PHOSPHORUS: Phosphorus: 2.4 mg/dL (ref 2.3–4.6)

## 2013-10-30 MED ORDER — AMLODIPINE BESYLATE 5 MG PO TABS
5.0000 mg | ORAL_TABLET | Freq: Every day | ORAL | Status: DC
  Administered 2013-10-30 – 2013-11-04 (×6): 5 mg via ORAL
  Filled 2013-10-30 (×6): qty 1

## 2013-10-30 NOTE — ED Provider Notes (Signed)
I saw and evaluated the patient, reviewed the resident's note and I agree with the findings and plan.   EKG Interpretation   Date/Time:  Monday October 27 2013 16:36:45 EDT Ventricular Rate:  99 PR Interval:  152 QRS Duration: 98 QT Interval:  365 QTC Calculation: 468 R Axis:   -53 Text Interpretation:  Sinus rhythm LAD, consider left anterior fascicular  block Minimal ST depression Baseline wander in lead(s) II III aVF ED  PHYSICIAN INTERPRETATION AVAILABLE IN [facility] Confirmed by TEST,  Record ([id number]) on 10/29/2013 8:22:44 AM      Pt comes in w/ cc of dib. Has chronic resp failure  -and has trach collar in place. Noted to be in hypoxic respiratory failure, with CXR showing pulmonary edema, bilateral. Pt needed CPAP / positive pressure vent - and ENT had to be consulted for cannulation of the trach. ICU to admit.  CRITICAL CARE Performed by: Varney Biles   Total critical care time: 45 minutes - for respiratory failure with need for CPAP.  Critical care time was exclusive of separately billable procedures and treating other patients.  Critical care was necessary to treat or prevent imminent or life-threatening deterioration.  Critical care was time spent personally by me on the following activities: development of treatment plan with patient and/or surrogate as well as nursing, discussions with consultants, evaluation of patient's response to treatment, examination of patient, obtaining history from patient or surrogate, ordering and performing treatments and interventions, ordering and review of laboratory studies, ordering and review of radiographic studies, pulse oximetry and re-evaluation of patient's condition.   Varney Biles, MD 10/30/13 [phone]

## 2013-10-30 NOTE — Trach Care Team (Signed)
Trach Care Progression Note   Patient Details Name: Melody Gray MRN: [id number] DOB: 1970-09-29 Today's Date: 10/30/2013   Tracheostomy Assessment    Tracheostomy [place] 6 mm Cuffed (Active)  Status Secured 10/30/2013  9:57 AM  Site Assessment Drainage 10/30/2013  9:57 AM  Site Care Cleansed;Dried;Dressing applied 10/29/2013  8:22 PM  Inner Cannula Care Changed/new 10/29/2013  8:22 PM  Ties Assessment Clean;Dry;Secure 10/30/2013  9:57 AM  Cuff pressure (cm) 0 cm 10/30/2013  9:57 AM  Trach Changed Yes 10/27/2013  7:18 PM  Emergency Equipment at bedside Yes 10/30/2013  9:57 AM     Care Needs     Respiratory Therapy O2 Device: Trach collar FiO2 (%): 35 % SpO2: 98 %    Speech Language Pathology      Physical Therapy      Occupational Therapy      Nutritional Patient's Current Diet: Carb modified    Case Management/Social Work Level of patient care prior to hospitalization: Home-Home Health ([facility]) Living status: Family (document relation) (young children) Insurance payer: Medicare Barriers to progression: fluid overload Plan to address barriers: diuresis/steroids Anticipated discharge disposition: [place]    Provider [place] Team Recommendations        [facility] Team Members -  [place], SLP, Deveron Furlong, [place], [place], SW, Philipp Deputy, RT and Maylon Peppers, RD     ? If pt needs PMSV          Koury Roddy, Jaci Carrel (scribe for team) 10/30/2013, 1:42 PM

## 2013-10-30 NOTE — Progress Notes (Signed)
69 Transferred to [place] [facility] via [place] and monitor and O2, placed in bed, charge RN in to check pt.

## 2013-10-30 NOTE — Progress Notes (Signed)
Report given to receiving RN. Patient in bed resting, watching TV. No verbal complaints. No signs or symptoms of distress or discomfort noted.

## 2013-10-30 NOTE — Evaluation (Signed)
[facility] Speaking Valve - Evaluation Patient Details  Name: Melody Gray MRN: [id number] Date of Birth: [birth year]  Today's Date: 10/30/2013 Time: [id number] SLP Time Calculation (min): 15 min  Past Medical History:  Past Medical History  Diagnosis Date  . Sarcoidosis     Followed by Dr. Melvyn Novas; w/ liver involvement per biopsy 12/09, Reversible airway component so started on [facility] 01/2010; HFA 75% p coaching 05/2010  . Hypoxemia     CT angiogram 9/11>> No PE; PFTs 10/11- FEV1 1.20 (49%) with 16% better p B2, DLCO 33%> corrects to 84; O2 sats ok on 4 lpm X rapid walk X 3 laps 05/2010  . Morbid obesity     Target wt= 153 for BMI <30  . QT prolongation   . Diabetes mellitus   . Hypertension   . Hx of cardiac cath 2/08    No CAD, no RAS,  normal EF  . Seborrheic dermatitis of scalp   . 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 Hep B serology neg. but Hep C ab +, HIV neg. 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 Cardiomyopathy, nonischemic     EF 45% 12/10; Echo 7/11 normal EF, PAS 48  . Diabetic retinopathy     Right eye 2/11  . Health maintenance examination     Mammogram 05/2010 Negative; Last Pap smear 03/2008; Last DM eye exam 2/11> mild non-proliferative diabetic retinopathy. OD  . Helicobacter pylori ab+ 05/2011    Pt was symptomatic and treatment planned for 05/2011  . CHF (congestive heart failure)    Past Surgical History:  Past Surgical History  Procedure Laterality Date  . Tubal ligation    . Breast surgery    . Cesarean section    . Tracheostomy tube placement N/A 08/10/2013    Procedure: TRACHEOSTOMY;  Surgeon: Melissa Montane, MD;  Location: [place];  Service: ENT;  Laterality: N/A;   HPI:  43 y.o.  with trach (from 07/2013 due to difficult intubation), diastolic CHF presents to ED with SOB and found to be in hypoxemic respiratory failure with acute pulmonary edema.  Per MD note, ENT changed to #  6cuffed if pt. required intubation ( # 6 cuffless prior to admit).  Additional PMH:  DM, cardiomyopathy, CHF, morbid obesity.  ST ordered due to difficulty using PMSV.   Assessment / Plan / Recommendation Clinical Impression  Pt. reported inadequate air exchange using PMSV and unable to wear for greater than 5 minutes.  Air trapping audible as SLP or pt. removed PMSV indicative of decreased mobilization of air to upper airway.  Vocalization is hoarse/strained; vitals remained within normal limits.  Deflated cuff likely preventing optimal volume to air to upper airway.  Per ENT documentation, goal is wean O2 to 28% then change trach to cuffless.  SLP educated pt. on findings and clinical reasoning for current respiratory/phonatory challenges and compensatory strategies.  Cognition is 100% intact and pt. able to donn/doff valve without difficulty.  ST will follow for optimal vocal quality for verbal communication.     SLP Assessment  Patient needs continued Speech Lanaguage Pathology Services    Follow Up Recommendations  None    Frequency and Duration min 1 x/week  2 weeks   Pertinent Vitals/Pain Denied pain    SLP Goals Potential to Achieve Goals: Good   PMSV Trial  PMSV was placed for:  (2 intervals of 4  minutes) Able to redirect subglottic air through upper airway: Yes Able to Attain Phonation: Yes Voice Quality: Hoarse Able to Expectorate Secretions: No attempts Breath Support for Phonation: Mildly decreased Intelligibility: Intelligible Respirations During Trial:  (WDL) SpO2 During Trial:  (97-100%) Pulse During Trial:  (102) Behavior: Alert;Controlled;Cooperative;Expresses self well;Good eye contact;Responsive to questions   Tracheostomy Tube       Vent Dependency  FiO2 (%):  (35%, changed to 28% by RT during eval)    Cuff Deflation Trial Tolerated Cuff Deflation:  (previously deflated) Behavior: Alert;Controlled;Cooperative;Expresses self well;Good eye  contact;Responsive to questions;Smiling;Oriented X3   Orbie Pyo [place].Ed [employer] [phone]  10/30/2013

## 2013-10-30 NOTE — Evaluation (Signed)
Physical Therapy Evaluation Patient Details Name: Melody Gray MRN: [id number] DOB: 11/26/1970 Today's Date: 10/30/2013   History of Present Illness  Pt admitted with SOB, hypoxemic respiratory failure.  PMH:  morbid obesity,  CHF, sarcoidosis with pulmonary HTN, OSA, and trach placement in April 2015.  Clinical Impression  Patient evaluated by Physical Therapy with no further acute PT needs identified. Pt is at or approaching baseline function.  She knows some of her food choices have likely led to this admission. Pt can ambulate in the halls with family or alone with set up of Venturi tubing. PT is signing off. Thank you for this referral..    Follow Up Recommendations No PT follow up    Equipment Recommendations  None recommended by PT    Recommendations for Other Services       Precautions / Restrictions Precautions Precaution Comments: 28 % TC Restrictions Weight Bearing Restrictions: No      Mobility  Bed Mobility Overal bed mobility: Independent                Transfers Overall transfer level: Independent                  Ambulation/Gait Ambulation/Gait assistance: Modified independent (Device/Increase time) Ambulation Distance (Feet): 700 Feet Assistive device:  (pulling portable O2) Gait Pattern/deviations: WFL(Within Functional Limits) Gait velocity: slower, but able to speed up as needed Gait velocity interpretation: Below normal speed for age/gender General Gait Details: steady but with slower speed than age appropriate.  Stairs            Wheelchair Mobility    Modified Rankin (Stroke Patients Only)       Balance Overall balance assessment: No apparent balance deficits (not formally assessed)                                           Pertinent Vitals/Pain EHR 120's,  O2 sats immediately following gait was 89% and quickly to 21% on 28% TC    Home Living Family/patient expects to be discharged to:: Private  residence Living Arrangements: Children (70 and 82 years old) Available Help at Discharge: Family;Available 24 hours/day;Neighbor Type of Home: House Home Access: Stairs to enter Entrance Stairs-Rails: Left Entrance Stairs-Number of Steps: 5 Home Layout: One level Home Equipment: Crutches;Cane - single point (02) Additional Comments: 2L/min supplemental O2 at home    Prior Function Level of Independence: Independent         Comments: on disability, uses motorized w/c in grocery, can perform meal prep and housekeeping, but her children and boyfriend tend to do it for her     Hand Dominance   Dominant Hand: Right    Extremity/Trunk Assessment   Upper Extremity Assessment: Overall WFL for tasks assessed           Lower Extremity Assessment: Overall WFL for tasks assessed      Cervical / Trunk Assessment: Normal  Communication   Communication: No difficulties (uses PMV)  Cognition Arousal/Alertness: Awake/alert Behavior During Therapy: WFL for tasks assessed/performed Overall Cognitive Status: Within Functional Limits for tasks assessed                      General Comments      Exercises        Assessment/Plan    PT Assessment Patent does not need any further PT services  PT Diagnosis     PT Problem List    PT Treatment Interventions     PT Goals (Current goals can be found in the Care Plan section) Acute Rehab PT Goals Patient Stated Goal: home with children PT Goal Formulation: With patient    Frequency     Barriers to discharge        Co-evaluation               End of Session Equipment Utilized During Treatment: Oxygen Activity Tolerance: Patient tolerated treatment well Patient left: Other (comment);with family/visitor present;with call bell/phone within reach (sitting EOB) Nurse Communication: Mobility status         Time: [id number] PT Time Calculation (min): 17 min   Charges:   PT Evaluation $Initial PT  Evaluation Tier I: 1 Procedure PT Treatments $Gait Training: 8-22 mins   PT G Codes:          Johnatan Baskette, Tessie Fass 10/30/2013, 4:33 PM 10/30/2013  Donnella Sham, PT [phone]  (pager)

## 2013-10-30 NOTE — H&P (Signed)
ICU Transfer Note  Date: 10/30/2013               Patient Name:  Melody Gray MRN: [id number]  DOB: [birth year] Age / Sex: 43 y.o., female   PCP: Jerene Pitch, MD         Medical Service: Internal Medicine Teaching Service         Attending Physician: Dr. Annia Belt, MD    First Contact: Dr. Arcelia Jew   Pager: [phone]  Second Contact: Dr. Clinton Gallant Pager: [phone]       After Hours (After 5p/  First Contact Pager: [phone]  weekends / holidays): Second Contact Pager: [phone]   Chief Complaint: Shortness of Breath  History of Present Illness:  Melody Gray is a 43 year old woman with PMH of DM2, morbid obesity, OSA (though with no sleep study), diastolic HF (grd2), sarcoidosis with hepatic involvement (on chronic prednisolone, dx 2001 followed by Dr. Melvyn Novas), pulmonary HTN on 2L home O2, anemia of chronic disease, HLP, HTN, GERD, with tracheostomy in April 2015 by ENT (Dr. Janace Hoard) due to difficult intubation in setting of OSA with hypercarbic respiratory failure.    She presented to the [facility] ED on 6/29 with SOB x 3 - 4 days that had progressively worsened since onset in addition to cough with green sputum scantly tinged with blood x week. In the ED she was found to be hypoxic with SpO2 in 70's. ABG at that time revealed hypoxemic respiratory failure (pO2 40). She presented with large leak of her trach collar which was exchanged with improvement in her oxygen saturation to SpO2 to 97% on 80% FiO2 and 10L O2. CXR obtained revealed pulmonary edema. ENT (Dr. Wilburn Cornelia)  was subsequently consulted to exchange cuffless trach for cuffed trach in anticipation of likely need for patient to be ventilated due to her pulmonary edema and respiratory failure. PCCM was consulted and admitted the patient to their service. While in the ICU she responded well to Lasix IV with ~3.5L diuresis during this hospitalization and did not require mechanical ventilation but has been stable on ATC (Automatic Tube  Compensation). For the past 24 hours, she has maintained SpO2 >97% on ATC with gradual weaning off with 35% FiO2 and 8L O2 this morning and ABG improved with pO2 of 80 on 7/1. Prior to signing off, PCCM recommended calling ENT to change her trach to cuffless 6 once she reaches 28% FiO2 and is able to transition to PO steroids for her sarcoidosis.   Of note, the patient has a 20-pack-year smoking history and has quit smoking since March this year. Since then she has substitute cigarettes for "salty" potato chips and has gradually gained weight. She assures compliance with her home Lasix 40mg  once daily and weights herself regularly but tells me that she may need to buy a new scale as her weight had not changed and now she questions its accuracy.     Current Outpatient Prescriptions on File Prior to Encounter  Medication Sig Dispense Refill  . amLODipine (NORVASC) 5 MG tablet Take 1 tablet (5 mg total) by mouth daily.  30 tablet  5  . ferrous sulfate 325 (65 FE) MG tablet Take 1 tablet (325 mg total) by mouth 3 (three) times daily with meals.  90 tablet  3  . furosemide (LASIX) 40 MG tablet Take 1 tablet (40 mg total) by mouth daily.  30 tablet  1  . insulin aspart (NOVOLOG) 100 UNIT/ML injection CBG < 70:  implement hypoglycemia protocol CBG 70 - 130: 0 units CBG 131 - 180: 0 unit CBG 181-240: 2 units CBG 241-300 4 units  CBG 301-350 6 units CBG 351-400 8 units  10 mL  3  . Melatonin 3 MG TABS Take 3 mg by mouth at bedtime.      . metFORMIN (GLUCOPHAGE) 1000 MG tablet Take 1,000 mg by mouth 2 (two) times daily with a meal.      . pantoprazole (PROTONIX) 40 MG tablet Take 40 mg by mouth 2 (two) times daily.      . predniSONE (DELTASONE) 10 MG tablet Take 1 tablet (10 mg total) by mouth daily with breakfast.  30 tablet  0  . ACCU-CHEK FASTCLIX LANCETS MISC Check blood sugar 3 times a day as instructed  102 each  5  . glucose blood (ACCU-CHEK SMARTVIEW) test strip Check blood sugar 3 times a day as  instructed  100 each  5    Meds: Current Facility-Administered Medications  Medication Dose Route Frequency Provider Last Rate Last Dose  . 0.9 %  sodium chloride infusion  250 mL Intravenous PRN Rahul P Desai, PA-C      . acetaminophen (TYLENOL) tablet 650 mg  650 mg Oral Q4H PRN Tanda Rockers, MD   650 mg at 10/30/13 0359  . antiseptic oral rinse (BIOTENE) solution 15 mL  15 mL Mouth Rinse q12n4p Kara Mead V, MD   15 mL at 10/29/13 1700  . carvedilol (COREG) tablet 3.125 mg  3.125 mg Oral BID WC Rush Farmer, MD   3.125 mg at 10/29/13 1719  . chlorhexidine (PERIDEX) 0.12 % solution 15 mL  15 mL Mouth Rinse BID Kara Mead V, MD   15 mL at 10/29/13 2011  . heparin injection 5,000 Units  5,000 Units Subcutaneous 3 times per day Rahul Dianna Rossetti, PA-C   5,000 Units at 10/30/13 0629  . hydrocortisone sodium succinate (SOLU-CORTEF) 100 MG injection 50 mg  50 mg Intravenous Q6H Corey Harold, NP   50 mg at 10/30/13 0354  . insulin aspart (novoLOG) injection 0-20 Units  0-20 Units Subcutaneous 6 times per day Chesley Mires, MD   4 Units at 10/30/13 0354  . insulin glargine (LANTUS) injection 10 Units  10 Units Subcutaneous QHS Rush Farmer, MD   10 Units at 10/29/13 2237  . ipratropium-albuterol (DUONEB) 0.5-2.5 (3) MG/3ML nebulizer solution 3 mL  3 mL Nebulization Q4H PRN Corey Harold, NP      . pantoprazole (PROTONIX) EC tablet 40 mg  40 mg Oral BID Rigoberto Noel, MD   40 mg at 10/29/13 2257    Allergies: Allergies as of 10/27/2013 - Review Complete 10/27/2013  Allergen Reaction Noted  . Vicodin [hydrocodone-acetaminophen] Itching 11/17/2010   Past Medical History  Diagnosis Date  . Sarcoidosis     Followed by Dr. Melvyn Novas; w/ liver involvement per biopsy 12/09, Reversible airway component so started on [facility] 01/2010; HFA 75% p coaching 05/2010  . Hypoxemia     CT angiogram 9/11>> No PE; PFTs 10/11- FEV1 1.20 (49%) with 16% better p B2, DLCO 33%> corrects to 84; O2 sats ok on 4 lpm X rapid  walk X 3 laps 05/2010  . Morbid obesity     Target wt= 153 for BMI <30  . QT prolongation   . Diabetes mellitus   . Hypertension   . Hx of cardiac cath 2/08    No CAD, no RAS,  normal EF  .  Seborrheic dermatitis of scalp   . 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 Hep B serology neg. but Hep C ab +, HIV neg. 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 Cardiomyopathy, nonischemic     EF 45% 12/10; Echo 7/11 normal EF, PAS 48  . Diabetic retinopathy     Right eye 2/11  . Health maintenance examination     Mammogram 05/2010 Negative; Last Pap smear 03/2008; Last DM eye exam 2/11> mild non-proliferative diabetic retinopathy. OD  . Helicobacter pylori ab+ 05/2011    Pt was symptomatic and treatment planned for 05/2011  . CHF (congestive heart failure)    Past Surgical History  Procedure Laterality Date  . Tubal ligation    . Breast surgery    . Cesarean section    . Tracheostomy tube placement N/A 08/10/2013    Procedure: TRACHEOSTOMY;  Surgeon: Melissa Montane, MD;  Location: [facility] OR;  Service: ENT;  Laterality: N/A;   Family History  Problem Relation Age of Onset  . Cancer Mother     colon cancer  . Multiple sclerosis Father   . Asthma Sister     in childhood  . Diabetes Father   . Hypertension     History   Social History  . Marital Status: Single    Spouse Name: N/A    Number of Children: 2  . Years of Education: N/A   Occupational History  . 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 Smoking status: Former Smoker -- 0.30 packs/day for 20 years  . Smokeless tobacco: Never Used     Comment: Needs to cut back.  . Alcohol Use: No  . Drug Use: No  . Sexual Activity: Not Currently    Birth Control/ Protection: None   Other Topics Concern  . Not on file   Social History Narrative   Diabetic card given 05/03/2010.   Financial assistance approved for 100% discount at [facility] and has  [facility] card. [name] 12/07/2009.      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    Review of Systems: Review of Systems  Constitutional: Negative for fever, chills, weight loss, malaise/fatigue and diaphoresis.  HENT: Negative for congestion and nosebleeds.   Eyes: Negative for blurred vision.  Respiratory: Positive for cough, sputum production and shortness of breath.   Cardiovascular: Negative for chest pain, palpitations, leg swelling and PND.  Gastrointestinal: Negative for nausea, vomiting, abdominal pain, diarrhea, constipation and blood in stool.  Genitourinary: Negative for dysuria, urgency, frequency and hematuria.  Musculoskeletal: Negative for myalgias.  Skin: Negative for itching and rash.  Neurological: Negative for dizziness, focal weakness, weakness and headaches.       She is sleepy  Psychiatric/Behavioral: Negative for depression. The patient has insomnia.    Physical Exam: Blood pressure 142/94, pulse 93, temperature 98.9 F (37.2 C), temperature source Oral, resp. rate 24, height 5\' 1"  (1.549 m), weight 235 lb 3.7 oz (106.7 kg), last menstrual period 10/14/2013, SpO2 96.00%.  Physical Exam  Nursing note and vitals reviewed. Constitutional: She is oriented to person, place, and time. No distress.  Morbidly Obese, moon face  HENT:  Mouth/Throat: Oropharynx is clear and moist. No oropharyngeal exudate.  Trach in place on ATC  Eyes: Conjunctivae are normal. Pupils are equal, round, and reactive to light. Right  eye exhibits no discharge. Left eye exhibits no discharge. No scleral icterus.  Neck: Normal range of motion. Neck supple.  Cardiovascular: Normal rate and regular rhythm.  Exam reveals no gallop and no friction rub.   No murmur heard. Pulmonary/Chest: Effort normal. No respiratory distress. She has no wheezes. She has no rales. She exhibits no tenderness.  Diminished Breath sounds bilaterally at bases  Abdominal: Soft. Bowel  sounds are normal. She exhibits no distension. There is no tenderness. There is no rebound and no guarding.  Obese  Musculoskeletal: She exhibits edema.  Trace pitting edema bilaterally up to her knees  Neurological: She is alert and oriented to person, place, and time. No cranial nerve deficit.  Skin: Skin is warm and dry. No rash noted. She is not diaphoretic. No erythema.  Psychiatric: Affect normal.  Very pleasant and positive   Lab results: Basic Metabolic Panel:  Recent Labs  10/29/13 0328 10/30/13 0250  NA 139 140  K 4.8 3.9  CL 94* 94*  CO2 34* 36*  GLUCOSE 186* 147*  BUN 24* 20  CREATININE 1.06 0.88  CALCIUM 9.1 9.2  MG 1.7 1.6  PHOS 3.1 2.4   Liver Function Tests:  Recent Labs  10/27/13 1643  AST 16  ALT 14  ALKPHOS 130*  BILITOT 0.5  PROT 7.3  ALBUMIN 2.9*   CBC:  Recent Labs  10/29/13 0328 10/30/13 0250  WBC 13.8* 13.8*  HGB 12.5 11.8*  HCT 41.3 39.5  MCV 92.6 91.4  PLT 288 235   BNP:  Recent Labs  10/27/13 1643  PROBNP 2615.0*   CBG:  Recent Labs  10/29/13 0731 10/29/13 1210 10/29/13 1613 10/29/13 1923 10/29/13 2331 10/30/13 0347  GLUCAP 169* 280* 214* 181* 163* 123*    Imaging results:  Dg Chest Port 1 View  10/30/2013   CLINICAL DATA:  Assess endotracheal tube position.  EXAM: PORTABLE CHEST - 1 VIEW  COMPARISON:  Portable chest x-ray of October 28, 2013  FINDINGS: The lungs are well-expanded. The tracheostomy tip lies at the level of the clavicular heads. It is approximately 10 cm above the crotch of the carina. The lungs are well-expanded and the interstitial markings have cleared. The cardiac silhouette remains enlarged. The pulmonary vascularity is not engorged  IMPRESSION: The tracheostomy appliance is little changed from yesterday's study. There has been overall improvement in the appearance of the both lungs.   Electronically Signed   By: David  Martinique   On: 10/30/2013 07:35    Assessment & Plan by Problem:  43 yr-old  woman with PMH significant for morbid obesity, dCHF, sarcoidosis with pulmonary HTN, OSA, with trach placed in 4/15 presenting with SOB in setting of hypoxemic respiratory failure   Acute on chronic respiratory failure - She is on 2L O2 at home with pulmonary HTN due to sarcoidosis, and diastolic HF with grade 2 diastolic dysfunction (EF of 60-65% per 2D echo in 10/28/13). At that time of admission, ABG with pO of 40. This was likely multifactorial, due to volume overload (pulmonary edema on CXR), trach cuff leak, and chronic medical issues. She has diuresed ~3.5L since her admission with improvement of her respiratory status  maintaining >97% SpO2 on 35% FiO2. Her trach collar has been replaced by ENT, Dr. Janace Hoard, and she now has cuffed trach. Of note, the patient has an extremely difficulty airway and per ENT recommendations, neither PCCM or RT should be changing this tracheostomy. Per PCCM, once patient's ATC is titrated down to 28% PiO2  then ENT should be called again for change of her trach to cuffless 6 and she should be transitioned to PO steroids.  -Pt transferred to telemetry  -Continue ATC with goal of FiO2 28% -Respiratory Therapy consult -Continue diuresis with Lasix 40mg  IV q8hr with close creatinine and K monitoring -Continue DuoNebs q4h PRN for SOB and wheezing -Continue Solu-Cortef 50mg  q6h, may transition to PO tomorrow  Diastolic CHF- She came in with exacerbation as discussed above and has diuresed well so far. She is on Lasix 40mg  daily at home, Coreg 25mg  BID, and irbesartan 150mg  daily.  -Continue Lasix 40mg  IV q 6hr, may transition to PO tomorrow -Continue reduced dose of Coreg at 3.125 mg BID, may discuss the use of this nonselective BB with Pulmonology before increasing her dose.  -Strict ins and outs -BMET daily -Carb mod/low sodium diet  Tracheostomy status - She states that she is still getting used to her new trach and has not been able to use her voice box as well. She  was not getting Speech Therapy at home.  -Consult Speech Therapy -ENT following, will call them once pt is at 28%FiO2 and ready for collar exchange to cuffless 6  Sarcoidosis - She has been on chronic prednisone therapy for years, is on prednisone 10mg  daily at home. Currently on stress dose steroids.  -Consider tapering steroid to home maintenance dose of 10mg  daily   HTN- BP slightly elevated today at 145/88. At home she is on Norvasc 5mg  daily, Coreg 25mg  BID, Lasix 40mg  daily, Irbesartan 150mg  daily.  -Continue Coreg 3.125 mg BID -Continue Lasix 40mg  IV q8hr -Restart Norvasc 5 mg daily today   DM2 - Last Hgb A1C 8.1%. At home she is on metformin 1000mg  BID ac, Novolog SSI, and Levemir 26 units in the morning and 20 units at night.    -Hold metformin while inpatient, resume at discharge -SSI resistant -Continue Lantus 10 units qHs  Prolonged QTc- She has one EKG from 6/29 with motion artifact and what appears to be a prolonged QTc of 738 but this is no longer present in the subsequent EKG that same day. Recent EKGs from April 2015 and this month have shown QTc consistently <470. However, on chart review, it appears that she has had prolonged QTc listed in her problem list since at least 2008.  -Avoid QTc prolonging medications  -Telemetry monitoring  Normocytic anemia- Baseline HgB has been in the 7-8 range (as recent as may 2015)  but is currently in 11-12 range (which could be 2/2 to recent iron tx and/or chronic hypoxia). She reports sputum scantly tinged with blood but has not had recurrences of this with no bleeding during this hospitalization. Etiology to her anemia is likely multifactorial due to chronic disease (and chronic prednisone use) and iron deficiency. Anemia panel on 08/09/13 revealed low iron of 27, ferritin of 16, TIBC 495, with nl folate. She was placed on iron supplementation on 10/03/13 -Consider restarting oral iron supplementation.    GERD - She is on protonix 40mg   daily at home and we will continue this medication.   Hyperlipidemia - Lipid panel from 09/12/13 with LDL of 108, her goal is <100 given hat she is diabetic.  -Consider starting statin   VTE prophylaxis: Heparin 5,000 units q 8h SQ  FEN:  NSL Daily BMET Carb mod/low sodium diet  Code: Full   Dispo: Disposition is deferred at this time, awaiting improvement of current medical problems. Anticipated discharge in approximately 1-2 day(s). Will  order PT/OT for evaluation of [place] needs (pt came from home and was not receiving [place] services just prior to her admission).  The patient does have a current PCP Jerene Pitch, MD) and does need an [facility] hospital follow-up appointment after discharge.  The patient does not know have transportation limitations that hinder transportation to clinic appointments.  Signed: Blain Pais, MD PGY-3 IMTS 10/30/2013, 7:51 AM  Attending physician transfer note:  History, physical findings, problem assessment and management plan, accurate as recorded above by resident physician Dr. Adele Barthel.  Pleasant 43 year old woman with long-standing sarcoidosis, increasingly steroid dependent and oxygen dependent, who over time has developed progressive respiratory difficulties and pulmonary hypertension. She had a complicated admission here in April for acute respiratory failure. She required emergency tracheostomy on 08/10/2013 secondary to difficulty in attempts at intubation. She presents again at time of the current admission on June 29 with acute hypoxic respiratory failure. This is likely multifactorial with combination of a leak from her tracheal collar and fluid overload/congestive heart failure. She was initially managed by the critical care team until stable enough to be transferred to the general medicine service. Fortunately, she was able to avoid mechanical ventilation during this admission. She is currently oxygenating well on 28% oxygen with a  tracheal cuff. She is able to phonate. Exam: Blood pressure 159/96, pulse 85, temperature 98 F (36.7 C), temperature source Oral, resp. rate 18, height 5\' 1"  (1.549 m), weight 236 lb 8.9 oz (107.3 kg), last menstrual period 10/14/2013, SpO2 96.00%. Oropharynx without any erythema or exudate Lungs are clear with good air movement and resonant to percussion throughout Regular cardiac rhythm no murmur Extremities no edema, no calf tenderness, no cyanosis.  Impression: Acute on chronic respiratory failure-multifactorial-tracheal collar leak, acute on chronic heart failure from diastolic dysfunction, underlying pulmonary hypertension and obstructive airway disease. Condition now stabilizing with medical therapy. Plan is to convert to a collarless tracheostomy tube.  Ongoing assistance with ear nose and throat  surgery and pulmonary critical care medicine greatly appreciated.  Murriel Hopper, MD, [place]  Hematology-Oncology/Internal Medicine

## 2013-10-30 NOTE — Care Management Note (Addendum)
  Page 2 of 2   11/04/2013     2:53:32 PM CARE MANAGEMENT NOTE 11/04/2013  Patient:  JENIFER, CROSSETT   Account Number:  [id number]  Date Initiated:  10/30/2013  Documentation initiated by:  [facility]  Subjective/Objective Assessment:   43 year old woman with PMH of DM2, morbid obesity, OSA, diastolic HF, sarcoidosis with hepatic involvement, pulmonary HTN on 2L home O2,with tracheostomy in April 2015.Chief Complaint: SOB.//Home with young children.     Action/Plan:   clincial-Pt transferred to telemetry  -Continue ATC with goal of FiO2 28%  -Respiratory Therapy consult  -Continue diuresis with Lasix 40mg  IV q8hr with close creatinine and K monitoring  - DuoNebs q4h PRN  -Solu-Cortef 50mg  //Resume HH   Anticipated DC Date:  11/02/2013   Anticipated DC Plan:  [place]  CM consult      [facility] Choice  Resumption Of Svcs/PTA Provider   Choice offered to / List presented to:          [facility] arranged  HH-1 RN      [place].   Status of service:   Medicare Important Message given?  YES (If response is "NO", the following Medicare IM given date fields will be blank) Date Medicare IM given:  10/30/2013 Medicare IM given by:  [facility] Date Additional Medicare IM given:  11/03/2013 Additional Medicare IM given by:  Ninetta Adelstein  Discharge Disposition:  [place]  Per UR Regulation:  Reviewed for med. necessity/level of care/duration of stay  If discussed at [place] of Stay Meetings, dates discussed:   11/04/2013    Comments:  Mariann Laster RN, BSN, MSHL, CCM  Nurse - Case Manager,  (Unit [place])  [phone]  11/04/2013 DME:  [name] #6 cuffless THN to follow post discharge Disposition Plan:  HHS:  RN [facility] / [facility] notified of d/c)    Consuelo Thayne RN, BSN, MSHL, CCM  Nurse - Case Manager,  (Unit [place])  [phone]  10/31/2013 PT RECS:  None AVS and HH order updated.   Resume  RN Services / no PT needed.  [facility] / [facility]ch Penny notified. )   Camellia J. Clydene Laming, RN, BSN, [place] [phone] Pt currently active with [facility] for RN/PT services.  Resumption of care requested.  Hansel Feinstein of [facility] notified.  No DME needs identified at this time.

## 2013-10-30 NOTE — Progress Notes (Signed)
MD came to eval pt, fio2 weaned to 28% in attempt to wean.  Per MD, if pt fio2 weaning tolerated, MD will contact ENT to change trach. Pt tol 28% fio2 well, no distress noted. Sat 94-95%

## 2013-10-30 NOTE — Evaluation (Signed)
Occupational Therapy Evaluation Patient Details Name: ELVIDA FERRALES MRN: [id number] DOB: 11-17-1970 Today's Date: 10/30/2013    History of Present Illness Pt admitted with SOB, hypoxemic respiratory failure.  PMH:  morbid obesity,  CHF, sarcoidosis with pulmonary HTN, OSA, and trach placement in April 2015.   Clinical Impression   Pt demonstrated ability to perform transfers to [facility] and stand at sink for grooming independently.  She is able to bathe herself with setup. She is knowledgeable in pacing and energy conservation during ADL. Pt has good support at home of her friends and family. No further OT needs.    Follow Up Recommendations  No OT follow up    Equipment Recommendations  None recommended by OT    Recommendations for Other Services       Precautions / Restrictions Restrictions Weight Bearing Restrictions: No      Mobility Bed Mobility Overal bed mobility: Independent                Transfers Overall transfer level: Independent                    Balance Overall balance assessment: Independent                                          ADL                                         General ADL Comments: Pt can is able to perform ADL with set up, transfer to [facility] independently and stand at the sink for grooming independently.       Vision                     Perception     Praxis      Pertinent Vitals/Pain No pain, VSS     Hand Dominance Right   Extremity/Trunk Assessment Upper Extremity Assessment Upper Extremity Assessment: Overall WFL for tasks assessed   Lower Extremity Assessment Lower Extremity Assessment: Defer to PT evaluation   Cervical / Trunk Assessment Cervical / Trunk Assessment: Normal   Communication Communication Communication: No difficulties (uses PMV)   Cognition Arousal/Alertness: Awake/alert Behavior During Therapy: WFL for tasks assessed/performed Overall  Cognitive Status: Within Functional Limits for tasks assessed                     General Comments       Exercises       Shoulder Instructions      Home Living Family/patient expects to be discharged to:: Private residence Living Arrangements: Children (11 and 49 years old) Available Help at Discharge: Family;Available 24 hours/day;Neighbor Type of Home: House Home Access: Stairs to enter [employer] of Steps: 5 Entrance Stairs-Rails: Left Home Layout: One level     Bathroom Shower/Tub: Teacher, early years/pre: Standard     Home Equipment: Crutches;Cane - single point (02)   Additional Comments: 2L/min supplemental O2 at home      Prior Functioning/Environment Level of Independence: Independent        Comments: on disability, uses motorized w/c in grocery store, can perform meal prep and housekeeping, but her children and boyfriend tend to do it for her    OT Diagnosis:  OT Problem List:     OT Treatment/Interventions:      OT Goals(Current goals can be found in the care plan section) Acute Rehab OT Goals Patient Stated Goal: home with children  OT Frequency:     Barriers to D/C:            Co-evaluation              End of Session Nurse Communication:  (ok to have diet sprite)  Activity Tolerance: Patient tolerated treatment well Patient left: in bed;with call bell/phone within reach;with nursing/sitter in room   Time: 1315-1342 OT Time Calculation (min): 27 min Charges:  OT General Charges $OT Visit: 1 Procedure OT Evaluation $Initial OT Evaluation Tier I: 1 Procedure OT Treatments $Self Care/Home Management : 8-22 mins G-Codes:    Malka So 10/30/2013, 1:49 PM [phone]

## 2013-10-31 DIAGNOSIS — E119 Type 2 diabetes mellitus without complications: Secondary | ICD-10-CM

## 2013-10-31 DIAGNOSIS — I10 Essential (primary) hypertension: Secondary | ICD-10-CM

## 2013-10-31 DIAGNOSIS — D869 Sarcoidosis, unspecified: Secondary | ICD-10-CM

## 2013-10-31 DIAGNOSIS — K219 Gastro-esophageal reflux disease without esophagitis: Secondary | ICD-10-CM

## 2013-10-31 DIAGNOSIS — E785 Hyperlipidemia, unspecified: Secondary | ICD-10-CM

## 2013-10-31 DIAGNOSIS — I509 Heart failure, unspecified: Secondary | ICD-10-CM

## 2013-10-31 DIAGNOSIS — D649 Anemia, unspecified: Secondary | ICD-10-CM

## 2013-10-31 DIAGNOSIS — I5030 Unspecified diastolic (congestive) heart failure: Secondary | ICD-10-CM

## 2013-10-31 LAB — GLUCOSE, CAPILLARY
Glucose-Capillary: 194 mg/dL — ABNORMAL HIGH (ref 70–99)
Glucose-Capillary: 196 mg/dL — ABNORMAL HIGH (ref 70–99)
Glucose-Capillary: 213 mg/dL — ABNORMAL HIGH (ref 70–99)
Glucose-Capillary: 233 mg/dL — ABNORMAL HIGH (ref 70–99)
Glucose-Capillary: 266 mg/dL — ABNORMAL HIGH (ref 70–99)
Glucose-Capillary: 315 mg/dL — ABNORMAL HIGH (ref 70–99)

## 2013-10-31 MED ORDER — HYDROCORTISONE NA SUCCINATE PF 100 MG IJ SOLR
25.0000 mg | Freq: Four times a day (QID) | INTRAMUSCULAR | Status: DC
  Administered 2013-10-31 – 2013-11-01 (×3): 25 mg via INTRAVENOUS
  Filled 2013-10-31 (×8): qty 0.5

## 2013-10-31 MED ORDER — FUROSEMIDE 40 MG PO TABS
40.0000 mg | ORAL_TABLET | Freq: Every day | ORAL | Status: DC
  Administered 2013-10-31 – 2013-11-04 (×5): 40 mg via ORAL
  Filled 2013-10-31 (×5): qty 1

## 2013-10-31 MED ORDER — FERROUS SULFATE 325 (65 FE) MG PO TABS
325.0000 mg | ORAL_TABLET | Freq: Three times a day (TID) | ORAL | Status: DC
  Administered 2013-10-31 – 2013-11-04 (×12): 325 mg via ORAL
  Filled 2013-10-31 (×14): qty 1

## 2013-10-31 MED ORDER — ATORVASTATIN CALCIUM 20 MG PO TABS
20.0000 mg | ORAL_TABLET | Freq: Every day | ORAL | Status: DC
  Administered 2013-10-31 – 2013-11-03 (×4): 20 mg via ORAL
  Filled 2013-10-31 (×5): qty 1

## 2013-10-31 NOTE — Progress Notes (Signed)
Report given to receiving RN. Patient in bed resting, watching TV. No verbal complaints. No signs or symptoms of distress or discomfort noted.

## 2013-10-31 NOTE — Progress Notes (Signed)
Subjective: Patient is doing much better today. Breathing comfortably at FiO2 28 with O2sat of 96-97%. She reports good appetite. She denies CP, palpitations, N/V.   Objective: Vital signs in last 24 hours: Filed Vitals:   10/31/13 0832 10/31/13 0856 10/31/13 1000 10/31/13 1155  BP:  156/94 159/96   Pulse: 97 93 94 85  Temp:      TempSrc:      Resp: 18   18  Height:      Weight:      SpO2: 96%   96%   Weight change: -0.513 kg (-1 lb 2.1 oz)  Intake/Output Summary (Last 24 hours) at 10/31/13 1200 Last data filed at 10/31/13 0900  Gross per 24 hour  Intake   1080 ml  Output   1701 ml  Net   -621 ml   Physical Exam General: alert, cooperative, pleasant, NAD, sitting up in bed HEENT: [place]/AT, sclera anicteric, mucus membranes moist, trach in place on ACT Neck: supple, normal ROM CV: RRR, normal S1/S2, no m/g/r Pulm: CTA bilaterally, breathing comfortably, able to talk in sentences GI: BS+, soft, obese, nontender Ext: warm, moves all Neuro: alert, oriented to person, place and time, no CN deficits  Lab Results: CBC    Component Value Date/Time   WBC 13.8* 10/30/2013 0250   RBC 4.32 10/30/2013 0250   RBC 3.34* 08/09/2013 0915   HGB 11.8* 10/30/2013 0250   HCT 39.5 10/30/2013 0250   PLT 235 10/30/2013 0250   MCV 91.4 10/30/2013 0250   MCH 27.3 10/30/2013 0250   MCHC 29.9* 10/30/2013 0250   RDW 15.4 10/30/2013 0250   LYMPHSABS 2.1 08/25/2013 0742   MONOABS 0.7 08/25/2013 0742   EOSABS 0.1 08/25/2013 0742   BASOSABS 0.0 08/25/2013 0742   BMET    Component Value Date/Time   NA 140 10/30/2013 0250   K 3.9 10/30/2013 0250   CL 94* 10/30/2013 0250   CO2 36* 10/30/2013 0250   GLUCOSE 147* 10/30/2013 0250   BUN 20 10/30/2013 0250   CREATININE 0.88 10/30/2013 0250   CREATININE 0.95 09/12/2013 1115   CALCIUM 9.2 10/30/2013 0250   CALCIUM 9.5 07/28/2010 1133   GFRNONAA 79* 10/30/2013 0250   GFRNONAA 61 08/04/2013 1500   GFRAA >90 10/30/2013 0250   GFRAA 71 08/04/2013 1500     Micro Results: Recent Results  (from the past 240 hour(s))  MRSA PCR SCREENING     Status: None   Collection Time    10/27/13  9:11 PM      Result Value Ref Range Status   MRSA by PCR NEGATIVE  NEGATIVE Final   Comment:            The GeneXpert MRSA Assay (FDA     approved for NASAL specimens     only), is one component of a     comprehensive MRSA colonization     surveillance program. It is not     intended to diagnose MRSA     infection nor to guide or     monitor treatment for     MRSA infections.   Studies/Results: Dg Chest Port 1 View  10/30/2013   CLINICAL DATA:  Assess endotracheal tube position.  EXAM: PORTABLE CHEST - 1 VIEW  COMPARISON:  Portable chest x-ray of October 28, 2013  FINDINGS: The lungs are well-expanded. The tracheostomy tip lies at the level of the clavicular heads. It is approximately 10 cm above the crotch of the carina. The lungs are well-expanded  and the interstitial markings have cleared. The cardiac silhouette remains enlarged. The pulmonary vascularity is not engorged  IMPRESSION: The tracheostomy appliance is little changed from yesterday's study. There has been overall improvement in the appearance of the both lungs.   Electronically Signed   By: David  Martinique   On: 10/30/2013 07:35   Medications: I have reviewed the patient's current medications. Scheduled Meds: . amLODipine  5 mg Oral Daily  . antiseptic oral rinse  15 mL Mouth Rinse q12n4p  . carvedilol  3.125 mg Oral BID WC  . chlorhexidine  15 mL Mouth Rinse BID  . heparin  5,000 Units Subcutaneous 3 times per day  . hydrocortisone sod succinate (SOLU-CORTEF) inj  50 mg Intravenous Q6H  . insulin aspart  0-20 Units Subcutaneous 6 times per day  . insulin glargine  10 Units Subcutaneous QHS  . pantoprazole  40 mg Oral BID   Continuous Infusions:  PRN Meds:.sodium chloride, acetaminophen, ipratropium-albuterol Assessment/Plan: Ms. Sochor is a 43yo woman with PMHx significant for morbid obesity, dCHF, sarcoidosis with pulmonary HTN,  OSA, with trach placed in 4/15 presenting with SOB in setting of hypoxemic respiratory failure.  1. Acute on chronic respiratory failure - She is on 2L O2 at home with pulmonary HTN due to sarcoidosis, and diastolic HF with grade 2 diastolic dysfunction (EF of 60-65% per 2D echo in 10/28/13). Patient's respiratory failure was likely multifactorial, due to volume overload (pulmonary edema on CXR), trach cuff leak, and chronic medical issues. Her respiratory status has improved since admission to the ICU where she diuresed ~3.5L. Her trach collar has been replaced by ENT, Dr. Janace Hoard, and she now has cuffed trach. Of note, the patient has an extremely difficulty airway and per ENT recommendations, neither PCCM or RT should be changing this tracheostomy. Patient was titrated down to FiO2 28% and tolerated well. ENT was contacted and they recommended that no changes be made to the patient's trach cuff at this time. They recommended for the patient to follow up with Dr. Melvyn Novas (PCCM) in his trach clinic in 1-2 weeks and with Dr. Janace Hoard in 4-6 weeks.  - Start PO steroids, taper down to half dose (50mg  to 25mg ) --> Solu-cortef 25mg  IV Q6H - Continue telemetry  - Continue ATC and continue to ween patient off ventilator - Continue diuresis with Lasix 40mg  PO QD with close creatinine and K monitoring  - Continue DuoNebs q4h PRN for SOB and wheezing   2. Diastolic CHF- She came in with exacerbation as discussed above and has diuresed well so far. She is on Lasix 40mg  daily at home, Coreg 25mg  BID, and irbesartan 150mg  daily.  - Switch to home Lasix 40mg  PO QD - Continue reduced dose of Coreg at 3.125 mg BID, may discuss the use of this nonselective BB with Pulmonology before increasing her dose.  - Strict I%Os - BMET daily  - Carb mod/low sodium diet   3.Tracheostomy status - She states that she is still getting used to her new trach and has not been able to use her voice box as well. She was not getting Speech  Therapy at home.  - Speech Therapy consulted. Will continue to follow pt to optimize vocal quality.  - ENT contacted and recommend f/u as stated above  4. Sarcoidosis - She has been on chronic prednisone therapy for years, is on prednisone 10mg  daily at home. Currently on stress dose steroids and beginning taper today. Pt tapered to half of current dose (50mg  to  25mg ).   - Will continue tapering steroids to home maintenance dose of 10mg  daily   5. HTN- BP slightly elevated today at 145/88. At home she is on Norvasc 5mg  daily, Coreg 25mg  BID, Lasix 40mg  daily, Irbesartan 150mg  daily.  - Continue Coreg 3.125 mg BID  - Continue Lasix 40mg  PO QD  - Continue Norvasc 5 mg QD   6. DM2 - Last Hgb A1C 8.1%. At home she is on metformin 1000mg  BID ac, Novolog SSI, and Levemir 26 units in the morning and 20 units at night.  - Hold metformin while inpatient, resume at discharge  - SSI resistant  - Continue Lantus 10 units qHs   7. Prolonged QTc- She has one EKG from 6/29 with motion artifact and what appears to be a prolonged QTc of 738 but this is no longer present in the subsequent EKG that same day. Recent EKGs from April 2015 and this month have shown QTc consistently <470. However, on chart review, it appears that she has had prolonged QTc listed in her problem list since at least 2008.  - Avoid QTc prolonging medications  - Telemetry monitoring   8. Normocytic anemia- Baseline HgB has been in the 7-8 range (as recent as may 2015) but is currently in 11-12 range (which could be 2/2 to recent iron tx and/or chronic hypoxia). She reports sputum scantly tinged with blood but has not had recurrences of this with no bleeding during this hospitalization. Etiology to her anemia is likely multifactorial due to chronic disease (and chronic prednisone use) and iron deficiency. Anemia panel on 08/09/13 revealed low iron of 27, ferritin of 16, TIBC 495, with nl folate. She was placed on iron supplementation on  10/03/13  - Patient started on home iron supplementation   9. GERD  - Continue home dose of Protonix 40mg  QD   10. Hyperlipidemia - Lipid panel from 09/12/13 with LDL of 108, her goal is <100 given hat she is diabetic.  - Started on atorvastatin 20mg  QD   DVT/PE prophylaxis: Heparin 5,000 units q 8h SQ  Diet: Carb mod/low sodium diet  Code: Full  Dispo: Disposition is deferred at this time, awaiting improvement of current medical problems. Anticipated discharge in approximately 1-2 day(s).   The patient does have a current PCP Jerene Pitch, MD) and does need an [facility] hospital follow-up appointment after discharge.  The patient does not have transportation limitations that hinder transportation to clinic appointments.  .Services Needed at time of discharge: Y = Yes, Blank = No PT:   OT:   RN:   Equipment:   Other:     LOS: 4 days   Albin Felling, MD 10/31/2013, 12:00 PM

## 2013-11-01 LAB — BASIC METABOLIC PANEL
Anion gap: 10 (ref 5–15)
BUN: 18 mg/dL (ref 6–23)
CO2: 35 mEq/L — ABNORMAL HIGH (ref 19–32)
Calcium: 9.5 mg/dL (ref 8.4–10.5)
Chloride: 94 mEq/L — ABNORMAL LOW (ref 96–112)
Creatinine, Ser: 0.73 mg/dL (ref 0.50–1.10)
GFR calc Af Amer: >90 mL/min (ref >90)
GFR calc non Af Amer: >90 mL/min (ref >90)
Glucose, Bld: 197 mg/dL — ABNORMAL HIGH (ref 70–99)
Potassium: 4.5 mEq/L (ref 3.7–5.3)
Sodium: 139 mEq/L (ref 137–147)

## 2013-11-01 LAB — GLUCOSE, CAPILLARY
Glucose-Capillary: 158 mg/dL — ABNORMAL HIGH (ref 70–99)
Glucose-Capillary: 166 mg/dL — ABNORMAL HIGH (ref 70–99)
Glucose-Capillary: 171 mg/dL — ABNORMAL HIGH (ref 70–99)
Glucose-Capillary: 174 mg/dL — ABNORMAL HIGH (ref 70–99)
Glucose-Capillary: 209 mg/dL — ABNORMAL HIGH (ref 70–99)
Glucose-Capillary: 291 mg/dL — ABNORMAL HIGH (ref 70–99)
Glucose-Capillary: 303 mg/dL — ABNORMAL HIGH (ref 70–99)

## 2013-11-01 LAB — MAGNESIUM: Magnesium: 1.9 mg/dL (ref 1.5–2.5)

## 2013-11-01 MED ORDER — PREDNISONE 10 MG PO TABS
10.0000 mg | ORAL_TABLET | Freq: Two times a day (BID) | ORAL | Status: DC
  Administered 2013-11-01 – 2013-11-04 (×7): 10 mg via ORAL
  Filled 2013-11-01 (×10): qty 1

## 2013-11-01 MED ORDER — MORPHINE SULFATE 2 MG/ML IJ SOLN: 2.0000 mg | INTRAMUSCULAR | Status: DC | PRN

## 2013-11-01 MED ORDER — GUAIFENESIN ER 600 MG PO TB12
600.0000 mg | ORAL_TABLET | Freq: Two times a day (BID) | ORAL | Status: DC
  Administered 2013-11-01 – 2013-11-04 (×7): 600 mg via ORAL
  Filled 2013-11-01 (×8): qty 1

## 2013-11-01 MED ORDER — LORAZEPAM 2 MG/ML IJ SOLN
1.0000 mg | Freq: Once | INTRAMUSCULAR | Status: AC
  Administered 2013-11-01: 1 mg via INTRAVENOUS
  Filled 2013-11-01: qty 1

## 2013-11-01 NOTE — Progress Notes (Signed)
Report given to receiving RN. No signs of distress noted.

## 2013-11-01 NOTE — Progress Notes (Signed)
Subjective: NAEON. Pt very tearful about not being able to remove cuffed trach. Pt not able to sleep well and having some pain not relieved by tylenol when pt is being deep suctioned. She is having difficulty managing the trach she feels and not being able to "spit up my mucus."  Objective: Vital signs in last 24 hours: Filed Vitals:   10/31/13 2147 10/31/13 2305 11/01/13 0330 11/01/13 0616  BP: 149/90   168/84  Pulse: 86 83 81 72  Temp: 98.6 F (37 C)   98.1 F (36.7 C)  TempSrc: Oral   Oral  Resp: 17 18 16 16   Height:      Weight:    237 lb 3.2 oz (107.593 kg)  SpO2: 97% 96% 96% 100%   Weight change: 3 lb 1.6 oz (1.406 kg)  Intake/Output Summary (Last 24 hours) at 11/01/13 0707 Last data filed at 11/01/13 0618  Gross per 24 hour  Intake    580 ml  Output   1551 ml  Net   -971 ml   Physical Exam General: alert, cooperative, tearful NAD, sitting up in bed HEENT: [place]/AT, sclera anicteric, mucus membranes moist, trach in place on ACT Neck: supple, normal ROM CV: RRR, normal S1/S2, no m/g/r Pulm: CTA bilaterally, breathing comfortably, able to talk in sentences GI: BS+, soft, obese, nontender Ext: warm, normal ambulation, no pitting edema Neuro: alert, oriented to person, place and time, no CN deficits  Lab Results: Basic Metabolic Panel:  Recent Labs  10/30/13 0250 11/01/13 0401  NA 140 139  K 3.9 4.5  CL 94* 94*  CO2 36* 35*  GLUCOSE 147* 197*  BUN 20 18  CREATININE 0.88 0.73  CALCIUM 9.2 9.5  MG 1.6 1.9  PHOS 2.4  --    CBC:  Recent Labs  10/30/13 0250  WBC 13.8*  HGB 11.8*  HCT 39.5  MCV 91.4  PLT 235   CBG:  Recent Labs  10/31/13 1135 10/31/13 1635 10/31/13 2143 11/01/13 0006 11/01/13 0427 11/01/13 0754  GLUCAP 196* 266* 315* 174* 171* 158*   Micro Results: Recent Results (from the past 240 hour(s))  MRSA PCR SCREENING     Status: None   Collection Time    10/27/13  9:11 PM      Result Value Ref Range Status   MRSA by PCR  NEGATIVE  NEGATIVE Final   Comment:            The GeneXpert MRSA Assay (FDA     approved for NASAL specimens     only), is one component of a     comprehensive MRSA colonization     surveillance program. It is not     intended to diagnose MRSA     infection nor to guide or     monitor treatment for     MRSA infections.   Studies/Results: No results found. Medications: I have reviewed the patient's current medications. Scheduled Meds: . amLODipine  5 mg Oral Daily  . antiseptic oral rinse  15 mL Mouth Rinse q12n4p  . atorvastatin  20 mg Oral q1800  . carvedilol  3.125 mg Oral BID WC  . chlorhexidine  15 mL Mouth Rinse BID  . ferrous sulfate  325 mg Oral TID WC  . furosemide  40 mg Oral Daily  . heparin  5,000 Units Subcutaneous 3 times per day  . insulin aspart  0-20 Units Subcutaneous 6 times per day  . insulin glargine  10 Units Subcutaneous QHS  .  pantoprazole  40 mg Oral BID  . predniSONE  10 mg Oral BID WC   Continuous Infusions:  PRN Meds:.sodium chloride, acetaminophen, ipratropium-albuterol Assessment/Plan: Ms. Eskildsen is a 43yo woman with PMHx significant for morbid obesity, dCHF, sarcoidosis with pulmonary HTN, OSA, with trach placed in 4/15 presenting with SOB in setting of hypoxemic respiratory failure.  1. 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 Patient's respiratory failure was likely multifactorial, due to volume overload (pulmonary edema on CXR), trach cuff leak, and chronic medical issues. Her respiratory status has improved since admission to the ICU where she diuresed ~3.5L. Her trach collar has been replaced by ENT, Dr. Janace Hoard, and she now has cuffed trach. Of note, the patient has an extremely difficulty airway and per ENT recommendations, neither PCCM or RT should be changing this tracheostomy. Patient was  titrated down to FiO2 28% and tolerated well. ENT was contacted and they recommended that no changes be made to the patient's trach cuff at this time. They recommended for the patient to follow up with Dr. Melvyn Novas (PCCM) in his trach clinic in 1-2 weeks and with Dr. Janace Hoard in 4-6 weeks.  - Start PO steroids, taper down to half dose (50mg  to 25mg ) --> Solu-cortef 25mg  IV Q6H>>7/4 prednisone 10mg  BIDWC plan for 2 days then taper to home dose - Continue telemetry  - Continue ATC at 285 fio2  - Continue diuresis with Lasix 40mg  PO QD with close creatinine and K monitoring  - Continue DuoNebs q4h PRN for SOB and wheezing  -Mucinex for secretions -morphine prn for deep suctioning  2. Diastolic CHF- She came in with exacerbation as discussed above and has diuresed well so far. She is on Lasix 40mg  daily at home, Coreg 25mg  BID, and irbesartan 150mg  daily. Pt appears euvolemic today.  - continue Lasix 40mg  PO QD - Continue reduced dose of Coreg at 3.125 mg BID, may discuss the use of this nonselective BB with Pulmonology before increasing her dose.  - Strict I%Os - BMET daily  - Carb mod/low sodium diet   3.Tracheostomy status - She states that she is still getting used to her new trach and has not been able to use her voice box as well. She was not getting Speech Therapy at home.  - Speech Therapy consulted. Will continue to follow pt to optimize vocal quality.  - ENT contacted and recommend f/u as stated above  4. Sarcoidosis - She has been on chronic prednisone therapy for years, is on prednisone 10mg  daily at home. Currently on stress dose steroids and beginning taper today. Pt tapered to half of current dose (50mg  to 25mg ).   - Will continue tapering steroids as stated above  5. HTN- BP slightly elevated today at 145/88. At home she is on Norvasc 5mg  daily, Coreg 25mg  BID, Lasix 40mg  daily, Irbesartan 150mg  daily.  - Continue Coreg 3.125 mg BID  - Continue Lasix 40mg  PO QD  - Continue Norvasc 5 mg  QD   6. DM2 - Last Hgb A1C 8.1%. At home she is on metformin 1000mg  BID ac, Novolog SSI, and Levemir 26 units in the morning and 20 units at night.  - Hold metformin while inpatient, resume at discharge  - SSI resistant  - Continue Lantus 10 units qHs   7. Prolonged QTc- She has one EKG from 6/29  with motion artifact and what appears to be a prolonged QTc of 738 but this is no longer present in the subsequent EKG that same day. Recent EKGs from April 2015 and this month have shown QTc consistently <470. However, on chart review, it appears that she has had prolonged QTc listed in her problem list since at least 2008.  - Avoid QTc prolonging medications  - Telemetry monitoring   8. Normocytic anemia- Baseline HgB has been in the 7-8 range (as recent as may 2015) but is currently in 11-12 range (which could be 2/2 to recent iron tx and/or chronic hypoxia). She reports sputum scantly tinged with blood but has not had recurrences of this with no bleeding during this hospitalization. Etiology to her anemia is likely multifactorial due to chronic disease (and chronic prednisone use) and iron deficiency. Anemia panel on 08/09/13 revealed low iron of 27, ferritin of 16, TIBC 495, with nl folate. She was placed on iron supplementation on 10/03/13  - Patient started on home iron supplementation   9. GERD  - Continue home dose of Protonix 40mg  QD   10. Hyperlipidemia - Lipid panel from 09/12/13 with LDL of 108, her goal is <100 given hat she is diabetic.  - Started on atorvastatin 20mg  QD   DVT/PE prophylaxis: Heparin 5,000 units q 8h SQ  Diet: Carb mod/low sodium diet  Code: Full  Dispo: Disposition is deferred at this time, awaiting improvement of current medical problems. Anticipated discharge in approximately 1-2 day(s).   The patient does have a current PCP Jerene Pitch, MD) and does need an [facility] hospital follow-up appointment after discharge.  The patient does not have transportation limitations  that hinder transportation to clinic appointments.  .Services Needed at time of discharge: Y = Yes, Blank = No PT:   OT:   RN:   Equipment:   Other:     LOS: 5 days   Clinton Gallant, MD 11/01/2013, 7:07 AM

## 2013-11-02 ENCOUNTER — Inpatient Hospital Stay (HOSPITAL_COMMUNITY): Payer: Medicare Other

## 2013-11-02 LAB — GLUCOSE, CAPILLARY
Glucose-Capillary: 182 mg/dL — ABNORMAL HIGH (ref 70–99)
Glucose-Capillary: 160 mg/dL — ABNORMAL HIGH (ref 70–99)
Glucose-Capillary: 261 mg/dL — ABNORMAL HIGH (ref 70–99)
Glucose-Capillary: 278 mg/dL — ABNORMAL HIGH (ref 70–99)
Glucose-Capillary: 307 mg/dL — ABNORMAL HIGH (ref 70–99)
Glucose-Capillary: 225 mg/dL — ABNORMAL HIGH (ref 70–99)

## 2013-11-02 NOTE — Progress Notes (Signed)
Subjective: Overnight pt desating on trach while sleeping overnight. Pt asymptomatic but needed to have her FiO2 increased to 35% again overnight. Pt very unhappy about her trach and feels she is not able to manage it safely she is very concerned that she is "trapping her air." Her mucus production is clearing since starting the Mucinex.   Objective: Vital signs in last 24 hours: Filed Vitals:   11/01/13 2351 11/02/13 0045 11/02/13 0436 11/02/13 0459  BP:   157/89   Pulse: 74  95 75  Temp:   98.5 F (36.9 C)   TempSrc:   Oral   Resp: 18  18 18   Height:      Weight:   238 lb 12.8 oz (108.319 kg)   SpO2: 92% 97% 98% 99%   Weight change: 1 lb 9.6 oz (0.726 kg)  Intake/Output Summary (Last 24 hours) at 11/02/13 0810 Last data filed at 11/02/13 0500  Gross per 24 hour  Intake    480 ml  Output   1000 ml  Net   -520 ml   Physical Exam General: alert, cooperative,sitting up in bed HEENT: [place]/AT, sclera anicteric, mucus membranes moist, trach in place on ACT Neck: supple, normal ROM CV: RRR, normal S1/S2, no m/g/r Pulm: CTA bilaterally, breathing comfortably, able to talk in sentences GI: BS+, soft, obese, nontender Ext: warm, normal ambulation, no pitting edema Neuro: alert, oriented to person, place and time, no CN deficits  Lab Results: Basic Metabolic Panel:  Recent Labs  11/01/13 0401  NA 139  K 4.5  CL 94*  CO2 35*  GLUCOSE 197*  BUN 18  CREATININE 0.73  CALCIUM 9.5  MG 1.9   CBG:  Recent Labs  11/01/13 0754 11/01/13 1220 11/01/13 1614 11/01/13 1923 11/01/13 2358 11/02/13 0548  GLUCAP 158* 166* 303* 291* 209* 182*   Micro Results: Recent Results (from the past 240 hour(s))  MRSA PCR SCREENING     Status: None   Collection Time    10/27/13  9:11 PM      Result Value Ref Range Status   MRSA by PCR NEGATIVE  NEGATIVE Final   Comment:            The GeneXpert MRSA Assay (FDA     approved for NASAL specimens     only), is one component of a   comprehensive MRSA colonization     surveillance program. It is not     intended to diagnose MRSA     infection nor to guide or     monitor treatment for     MRSA infections.   Studies/Results: No results found. Medications: I have reviewed the patient's current medications. Scheduled Meds: . amLODipine  5 mg Oral Daily  . antiseptic oral rinse  15 mL Mouth Rinse q12n4p  . atorvastatin  20 mg Oral q1800  . carvedilol  3.125 mg Oral BID WC  . chlorhexidine  15 mL Mouth Rinse BID  . ferrous sulfate  325 mg Oral TID WC  . furosemide  40 mg Oral Daily  . guaiFENesin  600 mg Oral BID  . heparin  5,000 Units Subcutaneous 3 times per day  . insulin aspart  0-20 Units Subcutaneous 6 times per day  . insulin glargine  10 Units Subcutaneous QHS  . pantoprazole  40 mg Oral BID  . predniSONE  10 mg Oral BID WC   Continuous Infusions:  PRN Meds:.sodium chloride, acetaminophen, ipratropium-albuterol, morphine injection Assessment/Plan: Melody Gray is a 43yo  woman with PMHx significant for morbid obesity, dCHF, sarcoidosis with pulmonary HTN, OSA, with trach placed in 4/15 presenting with SOB in setting of hypoxemic respiratory failure.  # Tracheostomy status - She states that she is still getting used to her new trach and has not been able to use her voice box as well. She continues to be frustrated by her sensation of air trapping. She has returned to FiO2 35%. There is concern pt may have sleep apnea (?central) Her trach collar has been replaced by ENT, Dr. Janace Hoard, and she now has cuffed trach. Of note, the patient has an extremely difficulty airway and per ENT recommendations, neither PCCM or RT should be changing this tracheostomy.  - Speech Therapy consulted. Did not have any other additional measures-appreciate - ENT contacted 7/3 for possible change in trach as pt was titrated down to FiO2 28%. ENT was contacted and they recommended that no changes be made to the patient's trach cuff at this  time. They recommended for the patient to follow up with Dr. Melvyn Novas (PCCM) in his trach clinic in 1-2 weeks and with Dr. Janace Hoard in 4-6 weeks.  -given that the patient is not tolerating trach and continues to desat would need to recontact preferably Dr. 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 Patient's respiratory failure was likely multifactorial, due to volume overload (pulmonary edema on CXR), trach cuff leak, and chronic medical issues. Her respiratory status has improved since admission to the ICU where she diuresed ~3.5L.  - 7/4 prednisone 10mg  BIDWC plan for 2 days then taper to home dose 10mg  qd on 7/6  - Continue telemetry  - Continue ATC at 28% fio2 per above - Continue diuresis with Lasix 40mg  PO QD with close creatinine and K monitoring  - Continue DuoNebs q4h PRN for SOB and wheezing  -Mucinex for secretions -morphine prn for deep suctioning  # Diastolic CHF- She came in with exacerbation as discussed above and has diuresed well so far. She is on Lasix 40mg  daily at home, Coreg 25mg  BID, and irbesartan 150mg  daily. Pt appears euvolemic today.  - continue Lasix 40mg  PO QD - Continue reduced dose of Coreg at 3.125 mg BID, may discuss the use of this nonselective BB with Pulmonology before increasing her dose.  - Strict I%Os - BMET daily  - Carb mod/low sodium diet   # Sarcoidosis - She has been on chronic prednisone therapy for years, is on prednisone 10mg  daily at home. Currently on stress dose steroids and beginning taper today. Pt tapered to half of current dose (50mg  to 25mg ).   - Will continue tapering steroids as stated above  # HTN- BP  slightly elevated today at 145/88. At home she is on Norvasc 5mg  daily, Coreg 25mg  BID, Lasix 40mg  daily, Irbesartan 150mg  daily.  - Continue Coreg 3.125 mg BID  - Continue Lasix 40mg  PO QD  - Continue Norvasc 5 mg QD   # DM2 - Last Hgb A1C 8.1%. At home she is on metformin 1000mg  BID ac, Novolog SSI, and Levemir 26 units in the morning and 20 units at night.  - Hold metformin while inpatient,  resume at discharge  - SSI resistant  - Continue Lantus 10 units qHs   # Normocytic anemia- Baseline HgB has been in the 7-8 range (as recent as may 2015) but is currently in 11-12 range (which could be 2/2 to recent iron tx and/or chronic hypoxia). She reports sputum scantly tinged with blood but has not had recurrences of this with no bleeding during this hospitalization. Etiology to her anemia is likely multifactorial due to chronic disease (and chronic prednisone use) and iron deficiency. Anemia panel on 08/09/13 revealed low iron of 27, ferritin of 16, TIBC 495, with nl folate. She was placed on iron supplementation on 10/03/13  - Patient started on home iron supplementation   #GERD  - Continue home dose of Protonix 40mg  QD   # Hyperlipidemia - Lipid panel from 09/12/13 with LDL of 108, her goal is <100 given hat she is diabetic.  - Started on atorvastatin 20mg  QD   DVT/PE prophylaxis: Heparin 5,000 units q 8h SQ  Diet: Carb mod/low sodium diet  Code: Full  Dispo: Disposition is deferred at this time, awaiting improvement of current medical problems. Anticipated discharge in approximately 1-2 day(s).   The patient does have a current PCP Jerene Pitch, MD) and does need an [facility] hospital follow-up appointment after discharge.  The patient does not have transportation limitations that hinder transportation to clinic appointments.  .Services Needed at time of discharge: Y = Yes, Blank = No PT:   OT:   RN:   Equipment:   Other:     LOS: 6 days   Clinton Gallant, MD 11/02/2013, 8:10 AM

## 2013-11-02 NOTE — Progress Notes (Signed)
Patients oxygen saturation dropping into the low 80s when sleeping.  RT woke patient up and sats came back up to 95%.  Immediately after patient falls back asleep, saturations dropped back into the low 80s.  RT increased FIO2 to 35%.  RN made aware of changes.

## 2013-11-03 LAB — BASIC METABOLIC PANEL
Anion gap: 7 (ref 5–15)
BUN: 18 mg/dL (ref 6–23)
CO2: 36 mEq/L — ABNORMAL HIGH (ref 19–32)
Calcium: 9.3 mg/dL (ref 8.4–10.5)
Chloride: 93 mEq/L — ABNORMAL LOW (ref 96–112)
Creatinine, Ser: 0.73 mg/dL (ref 0.50–1.10)
GFR calc Af Amer: >90 mL/min (ref >90)
GFR calc non Af Amer: >90 mL/min (ref >90)
Glucose, Bld: 195 mg/dL — ABNORMAL HIGH (ref 70–99)
Potassium: 4.7 mEq/L (ref 3.7–5.3)
Sodium: 136 mEq/L — ABNORMAL LOW (ref 137–147)

## 2013-11-03 LAB — CBC
HCT: 37.9 % (ref 36.0–46.0)
Hemoglobin: 11.6 g/dL — ABNORMAL LOW (ref 12.0–15.0)
MCH: 27.5 pg (ref 26.0–34.0)
MCHC: 30.6 g/dL (ref 30.0–36.0)
MCV: 89.8 fL (ref 78.0–100.0)
Platelets: 211 10*3/uL (ref 150–400)
RBC: 4.22 MIL/uL (ref 3.87–5.11)
RDW: 14.8 % (ref 11.5–15.5)
WBC: 10.5 10*3/uL (ref 4.0–10.5)

## 2013-11-03 LAB — GLUCOSE, CAPILLARY
Glucose-Capillary: 134 mg/dL — ABNORMAL HIGH (ref 70–99)
Glucose-Capillary: 187 mg/dL — ABNORMAL HIGH (ref 70–99)
Glucose-Capillary: 255 mg/dL — ABNORMAL HIGH (ref 70–99)
Glucose-Capillary: 272 mg/dL — ABNORMAL HIGH (ref 70–99)
Glucose-Capillary: 322 mg/dL — ABNORMAL HIGH (ref 70–99)
Glucose-Capillary: 347 mg/dL — ABNORMAL HIGH (ref 70–99)

## 2013-11-03 MED ORDER — RACEPINEPHRINE HCL 2.25 % IN NEBU
0.5000 mL | INHALATION_SOLUTION | RESPIRATORY_TRACT | Status: DC | PRN
  Filled 2013-11-03: qty 0.5

## 2013-11-03 NOTE — Progress Notes (Addendum)
Subjective: Patient appears uncomfortable and unhappy today. She complains that she feels she is "not getting enough air." She also complains of sinus pressure/pain in her ears as well as a sore throat.  Mucus production improved since started on Mucinex.   Objective: Vital signs in last 24 hours: Filed Vitals:   11/02/13 2350 11/03/13 0332 11/03/13 0450 11/03/13 0801  BP:   142/79   Pulse: 87 73 77 91  Temp:   98.1 F (36.7 C)   TempSrc:   Oral   Resp: 18 18 18 18   Height:      Weight:   239 lb 14.4 oz (108.818 kg)   SpO2: 100% 98% 100% 100%   Weight change: 1 lb 1.6 oz (0.499 kg)  Intake/Output Summary (Last 24 hours) at 11/03/13 1020 Last data filed at 11/03/13 0843  Gross per 24 hour  Intake    600 ml  Output   2600 ml  Net  -2000 ml   Physical Exam General: alert, cooperative, sitting up in bed, appears uncomfortable HEENT: [place]/AT, sclera anicteric, mucus membranes moist, trach in place on ACT  Neck: supple, normal ROM  CV: RRR, normal S1/S2, no m/g/r  Pulm: mild rales heard bilaterally, breaths labored, difficult for pt to talk in full sentences without taking a breath GI: BS+, soft, obese, nontender  Ext: warm, normal ambulation, no pitting edema  Neuro: alert, oriented to person, place and time, no CN deficits  Lab Results: Basic Metabolic Panel:  Recent Labs  11/01/13 0401 11/03/13 0035  NA 139 136*  K 4.5 4.7  CL 94* 93*  CO2 35* 36*  GLUCOSE 197* 195*  BUN 18 18  CREATININE 0.73 0.73  CALCIUM 9.5 9.3  MG 1.9  --    Liver Function Tests: No results found for this basename: AST, ALT, ALKPHOS, BILITOT, PROT, ALBUMIN,  in the last 72 hours No results found for this basename: LIPASE, AMYLASE,  in the last 72 hours No results found for this basename: AMMONIA,  in the last 72 hours CBC:  Recent Labs  11/03/13 0035  WBC 10.5  HGB 11.6*  HCT 37.9  MCV 89.8  PLT 211   Cardiac Enzymes: No results found for this basename: CKTOTAL, CKMB,  CKMBINDEX, TROPONINI,  in the last 72 hours BNP: No results found for this basename: PROBNP,  in the last 72 hours D-Dimer: No results found for this basename: DDIMER,  in the last 72 hours CBG:  Recent Labs  11/02/13 1147 11/02/13 1610 11/02/13 1938 11/02/13 2315 11/03/13 0442 11/03/13 0746  GLUCAP 261* 278* 307* 225* 272* 134*   Hemoglobin A1C: No results found for this basename: HGBA1C,  in the last 72 hours Fasting Lipid Panel: No results found for this basename: CHOL, HDL, LDLCALC, TRIG, CHOLHDL, LDLDIRECT,  in the last 72 hours Thyroid Function Tests: No results found for this basename: TSH, T4TOTAL, FREET4, T3FREE, THYROIDAB,  in the last 72 hours Anemia Panel: No results found for this basename: VITAMINB12, FOLATE, FERRITIN, TIBC, IRON, RETICCTPCT,  in the last 72 hours Coagulation: No results found for this basename: LABPROT, INR,  in the last 72 hours Urine Drug Screen: Drugs of Abuse  No results found for this basename: labopia, cocainscrnur, labbenz, amphetmu, thcu, labbarb    Alcohol Level: No results found for this basename: ETH,  in the last 72 hours Urinalysis: No results found for this basename: COLORURINE, APPERANCEUR, LABSPEC, PHURINE, GLUCOSEU, HGBUR, BILIRUBINUR, KETONESUR, PROTEINUR, UROBILINOGEN, NITRITE, LEUKOCYTESUR,  in the last  72 hours   Micro Results: Recent Results (from the past 240 hour(s))  MRSA PCR SCREENING     Status: None   Collection Time    10/27/13  9:11 PM      Result Value Ref Range Status   MRSA by PCR NEGATIVE  NEGATIVE Final   Comment:            The GeneXpert MRSA Assay (FDA     approved for NASAL specimens     only), is one component of a     comprehensive MRSA colonization     surveillance program. It is not     intended to diagnose MRSA     infection nor to guide or     monitor treatment for     MRSA infections.   Studies/Results: Dg Chest 2 View  11/02/2013   CLINICAL DATA:  Trach  EXAM: CHEST  2 VIEW   COMPARISON:  10/30/2013  FINDINGS: Lungs are essentially clear. No focal consolidation. Mild left basilar opacity, likely atelectasis. No pleural effusion or pneumothorax.  The heart is normal in size.  Tracheostomy in satisfactory position.  Visualized osseous structures are within normal limits.  IMPRESSION: Tracheostomy in satisfactory position.  No evidence of acute cardiopulmonary disease.   Electronically Signed   By: Julian Hy M.D.   On: 11/02/2013 13:41   Medications: I have reviewed the patient's current medications. Scheduled Meds: . amLODipine  5 mg Oral Daily  . antiseptic oral rinse  15 mL Mouth Rinse q12n4p  . atorvastatin  20 mg Oral q1800  . carvedilol  3.125 mg Oral BID WC  . chlorhexidine  15 mL Mouth Rinse BID  . ferrous sulfate  325 mg Oral TID WC  . furosemide  40 mg Oral Daily  . guaiFENesin  600 mg Oral BID  . heparin  5,000 Units Subcutaneous 3 times per day  . insulin aspart  0-20 Units Subcutaneous 6 times per day  . insulin glargine  10 Units Subcutaneous QHS  . pantoprazole  40 mg Oral BID  . predniSONE  10 mg Oral BID WC   Continuous Infusions:  PRN Meds:.sodium chloride, acetaminophen, ipratropium-albuterol, morphine injection Assessment/Plan: Melody Gray is a 43yo woman with PMHx significant for morbid obesity, dCHF, sarcoidosis with pulmonary HTN, OSA, with trach placed in 4/15 presenting with SOB in setting of hypoxemic respiratory failure.   1. Tracheostomy status - She states that she is still getting used to her new trach and has not been able to use her voice box as well. She continues to be frustrated by her sensation of air trapping. She has returned to FiO2 40%. There is concern pt may have sleep apnea (?central). CXR on 7/5 showed no congestion. Her trach collar has been replaced by ENT, Dr. Janace Hoard, and she now has cuffed trach. Of note, the patient has an extremely difficulty airway and per ENT recommendations, neither PCCM or RT should be changing  this tracheostomy.  - Speech Therapy consulted. Did not have any other additional measures-appreciate  - ENT contacted 7/3 for possible change in trach as pt was titrated down to FiO2 28%. ENT was contacted and they recommended that no changes be made to the patient's trach cuff at this time. They recommended for the patient to follow up with Dr. Melvyn Novas (PCCM) in his trach clinic in 1-2 weeks and with Dr. Janace Hoard in 4-6 weeks.  - Given that the patient is not tolerating the trach and continues to desat, ENT was  consulted. Will f/u recommendations, appreciated.  - Will increase FIO2 overnight and try to wean to 28% during the day again   2. 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 Patient's respiratory failure was likely multifactorial, due to volume overload (pulmonary edema on CXR), trach cuff leak, and chronic medical issues. Her respiratory status has improved since admission to the ICU where she diuresed ~3.5L.  - Prednisone- taper to home dose 10mg  QD today - Continue telemetry  - Continue ATC at 28% fio2 per above  - Continue diuresis with Lasix 40mg  PO QD with close creatinine and K monitoring  - Continue DuoNebs q4h PRN for SOB and wheezing  - Continue Mucinex for secretions  - Continue Morphine prn for deep suctioning   3. Diastolic CHF- She came in with exacerbation as discussed above and has diuresed well so far. She is on Lasix 40mg  daily at home, Coreg 25mg  BID, and irbesartan 150mg  daily. Pt appears euvolemic today.  - Continue Lasix 40mg  PO QD  - Continue reduced dose of Coreg at 3.125 mg BID, may discuss the use of this nonselective BB with Pulmonology before increasing her dose.  - Strict I%Os  - BMET daily  - Carb mod/low sodium diet   4. Sarcoidosis - She has been on chronic prednisone therapy for years, is on prednisone  10mg  daily at home. Patient was on stress dose steroids and restarting home dose today.  - Started Prednisone 10mg  QD  5. HTN- BP has been elevated in 140-150s. At home she is on Norvasc 5mg  daily, Coreg 25mg  BID, Lasix 40mg  daily, Irbesartan 150mg  daily.  - Continue Coreg 3.125 mg BID  - Continue Lasix 40mg  PO QD  - Continue Norvasc 5 mg QD  - Continue to monitor BP  6. DM2 - Last Hgb A1C 8.1%. At home she is on metformin 1000mg  BID ac, Novolog SSI, and Levemir 26 units in the morning and 20 units at night.  - Hold metformin while inpatient, resume at discharge  - SSI resistant  - Continue Lantus 10 units QHS  - Continue glucose checks Q4H  7. Normocytic anemia- Baseline HgB has been in the 7-8 range (as recent as may 2015) but is currently in 11-12 range (which could be 2/2 to recent iron tx and/or chronic hypoxia). She reports sputum scantly tinged with blood but has not had recurrences of this with no bleeding during this hospitalization. Etiology to her anemia is likely multifactorial due to chronic disease (and chronic prednisone use) and iron deficiency. Anemia panel on 08/09/13 revealed low iron of 27, ferritin of 16, TIBC 495, with nl folate. She was placed on iron supplementation on 10/03/13  - Patient on home iron supplementation   8. GERD  - Continue home dose of Protonix 40mg  QD   9.  Hyperlipidemia - Lipid panel from 09/12/13 with LDL of 108, her goal is <100 given hat she is diabetic.  - Started on atorvastatin 20mg  QD   DVT/PE prophylaxis: Heparin 5,000 units q 8h SQ  Diet: Carb mod/low sodium diet  Code: Full  Dispo: Disposition is deferred at this time, awaiting improvement of current medical problems. Anticipated discharge in approximately 1-2 day(s).   The patient does have a current PCP Jerene Pitch, MD) and does need an [facility] hospital follow-up appointment after discharge.  The patient does  not have transportation limitations that hinder transportation to clinic  appointments.  .Services Needed at time of discharge: Y = Yes, Blank = No PT:   OT:   RN:   Equipment:   Other:     LOS: 7 days   Albin Felling, MD 11/03/2013, 10:20 AM Attending physician note: I examined this patient together with resident physician Dr. Albin Felling and I concur with her assessment and management plan as outlined above. Overall the patient is doing well however she continues to have problems with the cuffed trach. She had some problems with oxygen desaturation over the weekend. The trach cuff is uncomfortable. We will ask ear nose and throat surgery to reevaluate her today.  Murriel Hopper, MD, [address]  Hematology-Oncology/Internal Medicine

## 2013-11-03 NOTE — Consult Note (Signed)
PMH:  Past Medical History  Diagnosis Date  . Sarcoidosis     Followed by Dr. Melvyn Novas; w/ liver involvement per biopsy 12/09, Reversible airway component so started on [facility] 01/2010; HFA 75% p coaching 05/2010  . Hypoxemia     CT angiogram 9/11>> No PE; PFTs 10/11- FEV1 1.20 (49%) with 16% better p B2, DLCO 33%> corrects to 84; O2 sats ok on 4 lpm X rapid walk X 3 laps 05/2010  . Morbid obesity     Target wt= 153 for BMI <30  . QT prolongation   . Diabetes mellitus   . Hypertension   . Hx of cardiac cath 2/08    No CAD, no RAS,  normal EF  . Seborrheic dermatitis of scalp   . 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 Hep B serology neg. but Hep C ab +, HIV neg. 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 Cardiomyopathy, nonischemic     EF 45% 12/10; Echo 7/11 normal EF, PAS 48  . Diabetic retinopathy     Right eye 2/11  . Health maintenance examination     Mammogram 05/2010 Negative; Last Pap smear 03/2008; Last DM eye exam 2/11> mild non-proliferative diabetic retinopathy. OD  . Helicobacter pylori ab+ 05/2011    Pt was symptomatic and treatment planned for 05/2011  . CHF (congestive heart failure)     PSH:  Past Surgical History  Procedure Laterality Date  . Tubal ligation    . Breast surgery    . Cesarean section    . Tracheostomy tube placement N/A 08/10/2013    Procedure: TRACHEOSTOMY;  Surgeon: Melissa Montane, MD;  Location: [place];  Service: ENT;  Laterality: N/AStephaine, [place] [id number] [birth year] Annia Belt, MD  Reason for Consult: tracheostomy discomfort/problems  HPI: [phone] s/p tracheostomy by Dr. Janace Hoard for acute respiratory failure, had 6 cuffed placed recently due to some desats. Patient now complains of subjective dyspnea and discomfort with cuffed trach so she requested to have trach changed from 6 cuffed to 6 cuffless.  Allergies:  Allergies  Allergen Reactions  . Vicodin  [Hydrocodone-Acetaminophen] Itching    ROS: dyspnea, shortness of breath, otherwise negative x  12 systems except per HPI.  PMH:  Past Medical History  Diagnosis Date  . Sarcoidosis     Followed by Dr. Melvyn Novas; w/ liver involvement per biopsy 12/09, Reversible airway component so started on [facility] 01/2010; HFA 75% p coaching 05/2010  . Hypoxemia     CT angiogram 9/11>> No PE; PFTs 10/11- FEV1 1.20 (49%) with 16% better p B2, DLCO 33%> corrects to 84; O2 sats ok on 4 lpm X rapid walk X 3 laps 05/2010  . Morbid obesity     Target wt= 153 for BMI <30  . QT prolongation   . Diabetes mellitus   . Hypertension   . Hx of cardiac cath 2/08    No CAD, no RAS,  normal EF  . Seborrheic dermatitis of scalp   . 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 Hep B serology neg. but Hep C ab +, HIV neg. 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 Cardiomyopathy, nonischemic     EF 45% 12/10; Echo 7/11 normal EF, PAS 48  . Diabetic  retinopathy     Right eye 2/11  . Health maintenance examination     Mammogram 05/2010 Negative; Last Pap smear 03/2008; Last DM eye exam 2/11> mild non-proliferative diabetic retinopathy. OD  . Helicobacter pylori ab+ 05/2011    Pt was symptomatic and treatment planned for 05/2011  . CHF (congestive heart failure)     FH:  Family History  Problem Relation Age of Onset  . Cancer Mother     colon cancer  . Multiple sclerosis Father   . Asthma Sister     in childhood  . Diabetes Father   . Hypertension      SH:  History   Social History  . Marital Status: Single    Spouse Name: N/A    Number of Children: 2  . Years of Education: N/A   Occupational History  . 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 Smoking status: Former Smoker -- 0.30 packs/day for 20 years  . Smokeless tobacco: Never Used     Comment: Needs to cut back.  . Alcohol Use: No  . Drug Use: No  .  Sexual Activity: Not Currently    Birth Control/ Protection: None   Other Topics Concern  . Not on file   Social History Narrative   Diabetic card given 05/03/2010.   Financial assistance approved for 100% discount at [facility] and has [facility] card. Melody Gray 12/07/2009.      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    PSH:  Past Surgical History  Procedure Laterality Date  . Tubal ligation    . Breast surgery    . Cesarean section    . Tracheostomy tube placement N/A 08/10/2013    Procedure: TRACHEOSTOMY;  Surgeon: Melissa Montane, MD;  Location: [place];  Service: ENT;  Laterality: N/A;    Physical  Exam: CN 2-12 grossly intact and symmetric. EACs normal BL. Oral cavity, lips, gums, ororpharynx normal with no masses or lesions. Skin warm and dry. Nasal cavity without polyps or purulence. External nose and ears without masses or lesions. EOMI, PERRLA. Has 6 cuffed trach in place with cuff deflated. Can voice with finger occlusion, no respiratory distress.  Procedure Note:  Tracheostomy tube change: Informed verbal consent was obtained after explaining the risks (including bleeding and infection), benefits and alternatives of the procedure. Verbal timeout was performed prior to the procedure. The old 6 cuffed trach was carefully removed. the tracheostomy site is somewhat narrow/tight but the new 6 cuffless was easily placed in the tracheostomy and secured with velcro trach ties. The tracheostomy was patent and secure and the patient was easily able to voice with finger occlusion and tolerated the procedure well with no immediate complications.  A/P: changed to a new 6 cuffless trach without difficulty. She can follow up with Dr. Janace Hoard as an outpatient. She has a new 4 cuffless and new velcro trach ties at her bedside and should bring these to her followup appointment with Dr. Janace Hoard.   Ruby Cola 11/03/2013 5:20 PM

## 2013-11-03 NOTE — Progress Notes (Signed)
Inpatient Diabetes Program Recommendations  AACE/ADA: New Consensus Statement on Inpatient Glycemic Control (2013)  Target Ranges:  Prepandial:   less than 140 mg/dL      Peak postprandial:   less than 180 mg/dL (1-2 hours)      Critically ill patients:  140 - 180 mg/dL   Reason for Visit: Hyperglycemia     Results for Melody Gray, Melody Gray (MRN [id number]) as of 11/03/2013 15:27  Ref. Range 11/02/2013 19:38 11/02/2013 23:15 11/03/2013 04:42 11/03/2013 07:46 11/03/2013 11:53  Glucose-Capillary Latest Range: 70-99 mg/dL 307 (H) 225 (H) 272 (H) 134 (H) 187 (H)   Consider Levemir 13 units q AM and 10 units q HS (1/2 home dose)   Also may consider Novolog 4 units tid with meals-meal coverage and Novolog moderate correction.   Will continue to follow. Thank you. Lorenda Peck, RD, LDN, CDE Inpatient Diabetes Coordinator [phone]

## 2013-11-03 NOTE — Progress Notes (Signed)
Speech Language Pathology Treatment: Melody Gray Speaking valve  Patient Details Name: Melody Gray MRN: [id number] DOB: Jun 16, 1970 Today's Date: 11/03/2013 Time: 0910-0928 SLP Time Calculation (min): 18 min  Assessment / Plan / Recommendation Clinical Impression  Pt continues to demonstrate limited tolerance of PMSV with decreased redirection of air to upper airway requiring significant force from pt for phonation. Pt must hold valve in place to avoid blowing it off and after 30 seconds to 1 minute intervals pt must briefly remove valve to release pressure. She reports feeling pressure build up in her eustachian tubes causing ear pain. She also has decreased management of her secretions as she is not able to cough secretions past cuffed trach and has required more tracheal suction. Fortunately pt is very independent and aware of PMSV and trach function and has achieved independence in PMSV placement and removal as needed to allow her express wants and needs. She struggles to carry out full conversation and reports eating and drinking with removal of valve is very hard. We discussed leaving valve off for meal and using finger occlusion for cough if needed. Continue to recommend cuffless trach for pts quality of life at d/c.    HPI HPI: 43 y.o.  with trach (from 07/2013 due to difficult intubation), diastolic CHF presents to ED with SOB and found to be in hypoxemic respiratory failure with acute pulmonary edema.  Per MD note, ENT changed to # 6cuffed if pt. required intubation ( # 6 cuffless prior to admit).  Additional PMH:  DM, cardiomyopathy, CHF, morbid obesity.  ST ordered due to difficulty using PMSV.   Pertinent Vitals NA  SLP Plan  Continue with current plan of care    Recommendations        Patient may use Passy-Muir Speech Valve:  (pt independent despite tolerating brief intervals) PMSV Supervision: Intermittent MD: Please consider changing trach tube to : Cuffless       Plan: Continue  with current plan of care    GO    [facility], [place] CCC-SLP [id number]  Lynann Beaver 11/03/2013, 9:34 AM

## 2013-11-04 LAB — GLUCOSE, CAPILLARY
Glucose-Capillary: 245 mg/dL — ABNORMAL HIGH (ref 70–99)
Glucose-Capillary: 160 mg/dL — ABNORMAL HIGH (ref 70–99)
Glucose-Capillary: 289 mg/dL — ABNORMAL HIGH (ref 70–99)

## 2013-11-04 MED ORDER — PREDNISONE 10 MG PO TABS
10.0000 mg | ORAL_TABLET | Freq: Every day | ORAL | Status: DC
  Filled 2013-11-04: qty 1

## 2013-11-04 MED ORDER — GUAIFENESIN ER 600 MG PO TB12: 600.0000 mg | ORAL_TABLET | Freq: Two times a day (BID) | ORAL | Status: DC

## 2013-11-04 MED ORDER — ATORVASTATIN CALCIUM 20 MG PO TABS: 20.0000 mg | ORAL_TABLET | Freq: Every day | ORAL | Status: DC

## 2013-11-04 MED ORDER — CARVEDILOL 3.125 MG PO TABS: 3.1250 mg | ORAL_TABLET | Freq: Two times a day (BID) | ORAL | Status: DC

## 2013-11-04 NOTE — Progress Notes (Signed)
Verbalized understanding of discharge instructions. Realized.

## 2013-11-04 NOTE — Progress Notes (Signed)
Speech Language Pathology Treatment: [name] Speaking valve  Patient Details Name: DAY GREB MRN: [id number] DOB: 1971/04/24 Today's Date: 11/04/2013 Time: 4840-3979 SLP Time Calculation (min): 15 min  Assessment / Plan / Recommendation Clinical Impression  Pt demonstrates much improved tolerance of PMSV with new cuffless 6 SHiley. Pt is able to wear PMSV without any air trapping or sensation of pressure. Pt no longer needs to hold her PMSV in place for speech. Voice still very hoarse, but breath support improved. Pt demonstrates independence with valve placement and verbalizes precautions. SLP assisted pt in placing leash for PMSV to prevent loss. No further SLP f/u needed, all education complete and goals met.    HPI HPI: 43 y.o.  with trach (from 07/2013 due to difficult intubation), diastolic CHF presents to ED with SOB and found to be in hypoxemic respiratory failure with acute pulmonary edema.  Per MD note, ENT changed to # 6cuffed if pt. required intubation ( # 6 cuffless prior to admit).  Additional PMH:  DM, cardiomyopathy, CHF, morbid obesity.  ST ordered due to difficulty using PMSV.   Pertinent Vitals NA  SLP Plan  All goals met    Recommendations        Patient may use Passy-Muir Speech Valve: During all waking hours (remove during sleep) PMSV Supervision: Intermittent       Plan: All goals met    GO    [name], MA CCC-SLP [phone]  Lynann Beaver 11/04/2013, 12:59 PM

## 2013-11-04 NOTE — Discharge Summary (Signed)
Name: Melody Gray MRN: [id number] DOB: 08-Oct-1970 43 y.o. PCP: Jerene Pitch, MD  Date of Admission: 10/27/2013  4:26 PM Date of Discharge: 11/04/2013 Attending Physician: Annia Belt, MD  Discharge Diagnosis: Principal Problem:   Acute on chronic respiratory failure Active Problems:   Sarcoidosis   Diabetes mellitus type II, controlled   OBESITY, MORBID   HYPERTENSION   CONGESTIVE HEART FAILURE   GERD   LONG QT SYNDROME   Pulmonary hypertension associated with sarcoidosis   Tracheostomy status  Discharge Medications:   Medication List         ACCU-CHEK FASTCLIX LANCETS Misc  Check blood sugar 3 times a day as instructed     amLODipine 5 MG tablet  Commonly known as:  NORVASC  Take 1 tablet (5 mg total) by mouth daily.     atorvastatin 20 MG tablet  Commonly known as:  LIPITOR  Take 1 tablet (20 mg total) by mouth daily at 6 PM.     carvedilol 3.125 MG tablet  Commonly known as:  COREG  Take 1 tablet (3.125 mg total) by mouth 2 (two) times daily with a meal.     ferrous sulfate 325 (65 FE) MG tablet  Take 1 tablet (325 mg total) by mouth 3 (three) times daily with meals.     furosemide 40 MG tablet  Commonly known as:  LASIX  Take 1 tablet (40 mg total) by mouth daily.     glucose blood test strip  Commonly known as:  ACCU-CHEK SMARTVIEW  Check blood sugar 3 times a day as instructed     guaiFENesin 600 MG 12 hr tablet  Commonly known as:  MUCINEX  Take 1 tablet (600 mg total) by mouth 2 (two) times daily.     insulin aspart 100 UNIT/ML injection  Commonly known as:  novoLOG  - CBG < 70: implement hypoglycemia protocol  - CBG 70 - 130: 0 units  - CBG 131 - 180: 0 unit  - CBG 181-240: 2 units  - CBG 241-300 4 units   - CBG 301-350 6 units  - CBG 351-400 8 units     insulin detemir 100 UNIT/ML injection  Commonly known as:  LEVEMIR  Inject 20-26 Units into the skin at bedtime. 26 units every morning and 20 units every night     irbesartan 150 MG tablet  Commonly known as:  AVAPRO  Take 150 mg by mouth daily.     Melatonin 3 MG Tabs  Take 3 mg by mouth at bedtime.     metFORMIN 1000 MG tablet  Commonly known as:  GLUCOPHAGE  Take 1,000 mg by mouth 2 (two) times daily with a meal.     pantoprazole 40 MG tablet  Commonly known as:  PROTONIX  Take 40 mg by mouth 2 (two) times daily.     predniSONE 10 MG tablet  Commonly known as:  DELTASONE  Take 1 tablet (10 mg total) by mouth daily with breakfast.        Disposition and follow-up:   Melody Gray was discharged from [facility] in Stable condition.  At the hospital follow up visit please address:  1.  Ease of new trach care, whether BB needs to be increased   2.  Labs / imaging needed at time of follow-up: none  3.  Pending labs/ test needing follow-up: none   Follow-up Appointments:     Follow-up Information   Follow up with [facility]. (Registered Nurse Services to start within 24-48 horus of discharge)    Contact information:   [address] [place] [address] [phone]       Follow up with Jerene Pitch, MD On 11/11/2013. (2:15 pm        )    Specialty:  Internal Medicine   Contact information:   [place] [address] [phone]       Follow up with Christinia Gully, MD On 11/19/2013. (at 9:15 am )    Specialty:  Pulmonary Disease   Contact information:   [address] [place] [address] [phone]       Discharge Instructions:  Consultations:  PCCM, ENT,  Procedures Performed:  Dg Chest 2 View  11/02/2013   CLINICAL DATA:  Trach  EXAM: CHEST  2 VIEW  COMPARISON:  10/30/2013  FINDINGS: Lungs are essentially clear. No focal consolidation. Mild left basilar opacity, likely atelectasis. No pleural effusion or pneumothorax.  The heart is normal in size.  Tracheostomy in satisfactory position.  Visualized osseous structures are within normal limits.   IMPRESSION: Tracheostomy in satisfactory position.  No evidence of acute cardiopulmonary disease.   Electronically Signed   By: Julian Hy M.D.   On: 11/02/2013 13:41   Dg Chest Port 1 View  10/30/2013   CLINICAL DATA:  Assess endotracheal tube position.  EXAM: PORTABLE CHEST - 1 VIEW  COMPARISON:  Portable chest x-ray of October 28, 2013  FINDINGS: The lungs are well-expanded. The tracheostomy tip lies at the level of the clavicular heads. It is approximately 10 cm above the crotch of the carina. The lungs are well-expanded and the interstitial markings have cleared. The cardiac silhouette remains enlarged. The pulmonary vascularity is not engorged  IMPRESSION: The tracheostomy appliance is little changed from yesterday's study. There has been overall improvement in the appearance of the both lungs.   Electronically Signed   By: David  Martinique   On: 10/30/2013 07:35   Dg Chest Port 1 View  10/28/2013   CLINICAL DATA:  Congestive heart failure and edema  EXAM: PORTABLE CHEST - 1 VIEW  COMPARISON:  10/27/2013  FINDINGS: Tracheostomy in good position. Diffuse bilateral airspace disease unchanged and most consistent with edema. Bibasilar atelectasis also unchanged.  IMPRESSION: No significant change bilateral pulmonary edema and bibasilar atelectasis.   Electronically Signed   By: Franchot Gallo M.D.   On: 10/28/2013 07:57   Dg Chest Port 1 View  10/27/2013   CLINICAL DATA:  Shortness of breath and cough with low oxygen saturation values ; history of sarcoidosis and CHF and tracheostomy placement  EXAM: PORTABLE CHEST - 1 VIEW  COMPARISON:  Portable chest x-ray of August 18, 2013  FINDINGS: The lungs are well-expanded and exhibit diffusely increased interstitial markings greater on the . The cardiopericardial silhouette is enlarged and the pulmonary vascularity is engorged. The tracheostomy appliance tip lies 5 cm above the crotch of the carina. The bony thorax is unremarkable.  IMPRESSION: Congestive heart  failure with pulmonary interstitial and alveolar edema. Superimposed pneumonia is not excluded.   Electronically Signed   By: David  Martinique   On: 10/27/2013 16:51    2D Echo: Left ventricle: The cavity size was normal. Wall thickness was at the upper limits of normal. Systolic function was normal. The estimated ejection fraction was in the range of 60% to 65%. Wall motion was normal; there were no regional wall motion abnormalities. Features are consistent with a pseudonormal  left ventricular filling pattern, with concomitant abnormal relaxation and increased filling pressure (grade 2 diastolic dysfunction). - Ventricular septum: The contour showed diastolic flattening and systolic flattening. - Aortic valve: Mildly calcified annulus. Trileaflet. There was no significant regurgitation. - Mitral valve: Mildly thickened leaflets . There was trivial regurgitation. - Left atrium: The atrium was mildly dilated. - Right ventricle: The cavity size was mildly dilated. The moderator band was prominent. Systolic function was at least mildly reduced, lateral wall and apex hypokinetic. - Right atrium: The atrium was mildly dilated. Central venous pressure (est): 3 mm Hg. - Tricuspid valve: There was mild regurgitation directed eccentrically. - Pulmonary arteries: Systolic pressure could not be accurately estimated. - Pericardium, extracardiac: There was no pericardial effusion.  Impressions: - Upper normal LV wall thickness with LVEF approximately 0000000 2 diastolic dysfunction. There is diastolic and systolic septal flattening suggestive of RV pressure and volume overload, although PASP was unable to be calculated. Mild biatrial enlargement. RV is mildly dilated and at least mildly hypokinetic, mainly lateral wall and apex. Mild, eccentric tricuspid regurgitation.  Admission HPI: Mrs. Bruce is a 43 year old woman with PMH of DM2, morbid obesity, OSA (though with no sleep study), diastolic HF (grd2),  sarcoidosis with hepatic involvement (on chronic prednisolone, dx 2001 followed by Dr. Melvyn Novas), pulmonary HTN on 2L home O2, anemia of chronic disease, HLP, HTN, GERD, with tracheostomy in April 2015 by ENT (Dr. Janace Hoard) due to difficult intubation in setting of OSA with hypercarbic respiratory failure.  She presented to the [facility] ED on 6/29 with SOB x 3 - 4 days that had progressively worsened since onset in addition to cough with green sputum scantly tinged with blood x week. In the ED she was found to be hypoxic with SpO2 in 70's. ABG at that time revealed hypoxemic respiratory failure (pO2 40). She presented with large leak of her trach collar which was exchanged with improvement in her oxygen saturation to SpO2 to 97% on 80% FiO2 and 10L O2. CXR obtained revealed pulmonary edema. ENT (Dr. Wilburn Cornelia) was subsequently consulted to exchange cuffless trach for cuffed trach in anticipation of likely need for patient to be ventilated due to her pulmonary edema and respiratory failure. PCCM was consulted and admitted the patient to their service. While in the ICU she responded well to Lasix IV with ~3.5L diuresis during this hospitalization and did not require mechanical ventilation but has been stable on ATC (Automatic Tube Compensation). For the past 24 hours, she has maintained SpO2 >97% on ATC with gradual weaning off with 35% FiO2 and 8L O2 this morning and ABG improved with pO2 of 80 on 7/1. Prior to signing off, PCCM recommended calling ENT to change her trach to cuffless 6 once she reaches 28% FiO2 and is able to transition to PO steroids for her sarcoidosis.  Of note, the patient has a 20-pack-year smoking history and has quit smoking since March this year. Since then she has substitute cigarettes for "salty" potato chips and has gradually gained weight. She assures compliance with her home Lasix 40mg  once daily and weights herself regularly but tells me that she may need to buy a new scale as her weight had not  changed and now she questions its accuracy.  Hospital Course by problem list: # Acute on chronic respiratory failure - Pt initially presented in hypoxemic respiratory failure with acute pulmonary edema on a trach 2/2 difficult intubation 07/2013. In the ED pt was placed on ATC and had a cuffless trach  placed by ENT. She is on 2L O2 at home with pulmonary HTN due to sarcoidosis, and diastolic HF with grade 2 diastolic dysfunction (EF of 60-65% per 2D echo in 10/28/13). At the time of admission, ABG with pO of 40. This was likely multifactorial, due to volume overload (pulmonary edema on CXR), trach cuff leak, and chronic medical issues. She has diuresed ~3.5L since her admission with improvement of her respiratory status maintaining >97% SpO2 on 35% FiO2. Her trach collar was replaced by ENT, Dr. Janace Hoard, and pt continues to have cuffed trach as below. Repeat CXRs by discharge showed total resolution of pts pulmonary edema and proper position of trach.    #Tracheostomy status - Pt initially had difficulty using cuffless trach in terms of speech and eating along with "backed up pressure sensations" when she was breathing. On occasion the pt would desat on the ATC and FiO2 requirement was needed to be increased to as high as 40% until Dr. Simeon Craft of ENT was able to change to a new size 6 ccuffless trach. The patient is to maintain this trach and bring the size 4 cuffless trach into her ENT visit in 4-6 wks (as above). Pt was able to tolerate and care for this trach w/o problem. The patient was given mucinex and duonebs at discharge to help with mucus clearing.   #Diastolic CHF- She came in with exacerbation as discussed above and has diuresed well throughout admission. Pt is on Lasix 40mg  daily at home, Coreg 25mg  BID, and irbesartan 150mg  daily. At discharge the patient was transitioned to a reduced dose of coreg of 3.125 bid and may discuss the use of this nonselective BB with Pulmonology before increasing her dose  in the setting of her significant ARF as mentioned above.   #Sarcoidosis - She has been on chronic prednisone therapy for years,at prednisone 10mg  daily. Pt was started on stress dose steroids during admission but able to be tapered to usual home maintenance dose of 10mg  daily at discharge.  #HTN- Pt was continued on home Norvasc 5mg  daily,Lasix 40mg  daily, Irbesartan 150mg  daily, and decreased coreg 3.125mg  as mentioned above.    #DM2 - Last Hgb A1C 8.1%. At home she is on metformin 1000mg  BID ac, Novolog SSI, and Levemir 26 units in the morning and 20 units at night. These were all continued at discharge.   #Prolonged QTc- She has one EKG from 6/29 with motion artifact and what appears to be a prolonged QTc of 738 but this was no longer present in the subsequent EKGs that same day. Recent EKGs from April 2015 and this month have shown QTc consistently <470. However, on chart review, it appears that she has had prolonged QTc listed in her problem list since at least 2008. This may not truly be a problem but follow up as outpt maybe recommended.   #Normocytic anemia- Baseline HgB has been in the 7-8 range (as recent as may 2015) but is currently in 11-12 range (which could be 2/2 to recent iron tx and/or chronic hypoxia). She reports sputum scantly tinged with blood but has not had recurrences of this with no bleeding during this hospitalization. Etiology to her anemia is likely multifactorial due to chronic disease (and chronic prednisone use) along with iron deficiency. Anemia panel on 08/09/13 revealed low iron of 27, ferritin of 16, TIBC 495, with nl folate. She was placed on iron supplementation on 10/03/13. Consider restarting oral iron supplementation.   #GERD - She is on protonix 40mg   daily at home and this was continued at discharge.   #Hyperlipidemia - Lipid panel from 09/12/13 with LDL of 108, her goal is <100 given hat she is diabetic. Consider starting statin   Discharge Vitals:   BP 135/73   Pulse 98  Temp(Src) 98.1 F (36.7 C) (Oral)  Resp 20  Ht 5\' 1"  (1.549 m)  Wt 240 lb 8 oz (109.09 kg)  BMI 45.47 kg/m2  SpO2 100%  LMP 10/14/2013 General: alert, cooperative, sitting up in bed, breathing comfortably  HEENT: [place]/AT, sclera anicteric, mucus membranes moist, trach in place on ACT  Neck: supple, normal ROM  CV: RRR, normal S1/S2, no m/g/r  Pulm: CTA bilaterally, breaths nonlabored, able to talk in full sentences  GI: BS+, soft, obese, nontender  Ext: warm, normal ambulation, no pitting edema  Neuro: alert, oriented to person, place and time, no CN deficits  Discharge Labs:  Results for orders placed during the hospital encounter of 10/27/13 (from the past 24 hour(s))  GLUCOSE, CAPILLARY     Status: Abnormal   Collection Time    11/03/13  4:59 PM      Result Value Ref Range   Glucose-Capillary 322 (*) 70 - 99 mg/dL   Comment 1 Notify RN    GLUCOSE, CAPILLARY     Status: Abnormal   Collection Time    11/03/13  8:02 PM      Result Value Ref Range   Glucose-Capillary 347 (*) 70 - 99 mg/dL   Comment 1 Notify RN     Comment 2 Documented in Chart    GLUCOSE, CAPILLARY     Status: Abnormal   Collection Time    11/03/13 11:45 PM      Result Value Ref Range   Glucose-Capillary 255 (*) 70 - 99 mg/dL  GLUCOSE, CAPILLARY     Status: Abnormal   Collection Time    11/04/13  3:56 AM      Result Value Ref Range   Glucose-Capillary 245 (*) 70 - 99 mg/dL  GLUCOSE, CAPILLARY     Status: Abnormal   Collection Time    11/04/13  7:49 AM      Result Value Ref Range   Glucose-Capillary 160 (*) 70 - 99 mg/dL   Comment 1 Notify RN    GLUCOSE, CAPILLARY     Status: Abnormal   Collection Time    11/04/13 12:06 PM      Result Value Ref Range   Glucose-Capillary 289 (*) 70 - 99 mg/dL   Comment 1 Notify RN      Signed: Clinton Gallant, MD 11/04/2013, 1:59 PM    Services Ordered on Discharge: advanced HH for trach  Equipment Ordered on Discharge: none

## 2013-11-04 NOTE — Progress Notes (Signed)
Subjective: Patient is feeling great today with new trach in place. She states she is breathing well and feels that she is getting enough air. She denies SOB, mucus production, CP. Patient is ready to be discharged today.  Objective: Vital signs in last 24 hours: Filed Vitals:   11/04/13 0338 11/04/13 0643 11/04/13 0913 11/04/13 1124  BP:  135/73    Pulse: 71 72 88 98  Temp:  98.1 F (36.7 C)    TempSrc:  Oral    Resp: 18 18 18 20   Height:      Weight:  240 lb 8 oz (109.09 kg)    SpO2: 97% 99% 100% 100%   Weight change: 9.6 oz (0.272 kg)  Intake/Output Summary (Last 24 hours) at 11/04/13 1547 Last data filed at 11/04/13 1303  Gross per 24 hour  Intake    480 ml  Output   1925 ml  Net  -1445 ml   Physical Exam General: alert, cooperative, sitting up in bed, breathing comfortably HEENT: [place]/AT, sclera anicteric, mucus membranes moist, trach in place on ACT  Neck: supple, normal ROM  CV: RRR, normal S1/S2, no m/g/r  Pulm: CTA bilaterally, breaths nonlabored, able to talk in full sentences  GI: BS+, soft, obese, nontender  Ext: warm, normal ambulation, no pitting edema  Neuro: alert, oriented to person, place and time, no CN deficits  Lab Results: Basic Metabolic Panel:  Recent Labs  11/03/13 0035  NA 136*  K 4.7  CL 93*  CO2 36*  GLUCOSE 195*  BUN 18  CREATININE 0.73  CALCIUM 9.3   Liver Function Tests: No results found for this basename: AST, ALT, ALKPHOS, BILITOT, PROT, ALBUMIN,  in the last 72 hours No results found for this basename: LIPASE, AMYLASE,  in the last 72 hours No results found for this basename: AMMONIA,  in the last 72 hours CBC:  Recent Labs  11/03/13 0035  WBC 10.5  HGB 11.6*  HCT 37.9  MCV 89.8  PLT 211   Cardiac Enzymes: No results found for this basename: CKTOTAL, CKMB, CKMBINDEX, TROPONINI,  in the last 72 hours BNP: No results found for this basename: PROBNP,  in the last 72 hours D-Dimer: No results found for this  basename: DDIMER,  in the last 72 hours CBG:  Recent Labs  11/03/13 1659 11/03/13 2002 11/03/13 2345 11/04/13 0356 11/04/13 0749 11/04/13 1206  GLUCAP 322* 347* 255* 245* 160* 289*   Hemoglobin A1C: No results found for this basename: HGBA1C,  in the last 72 hours Fasting Lipid Panel: No results found for this basename: CHOL, HDL, LDLCALC, TRIG, CHOLHDL, LDLDIRECT,  in the last 72 hours Thyroid Function Tests: No results found for this basename: TSH, T4TOTAL, FREET4, T3FREE, THYROIDAB,  in the last 72 hours Anemia Panel: No results found for this basename: VITAMINB12, FOLATE, FERRITIN, TIBC, IRON, RETICCTPCT,  in the last 72 hours Coagulation: No results found for this basename: LABPROT, INR,  in the last 72 hours Urine Drug Screen: Drugs of Abuse  No results found for this basename: labopia, cocainscrnur, labbenz, amphetmu, thcu, labbarb    Alcohol Level: No results found for this basename: ETH,  in the last 72 hours Urinalysis: No results found for this basename: COLORURINE, APPERANCEUR, LABSPEC, PHURINE, GLUCOSEU, HGBUR, BILIRUBINUR, KETONESUR, PROTEINUR, UROBILINOGEN, NITRITE, LEUKOCYTESUR,  in the last 72 hours   Micro Results: Recent Results (from the past 240 hour(s))  MRSA PCR SCREENING     Status: None   Collection Time  10/27/13  9:11 PM      Result Value Ref Range Status   MRSA by PCR NEGATIVE  NEGATIVE Final   Comment:            The GeneXpert MRSA Assay (FDA     approved for NASAL specimens     only), is one component of a     comprehensive MRSA colonization     surveillance program. It is not     intended to diagnose MRSA     infection nor to guide or     monitor treatment for     MRSA infections.   Studies/Results: No results found. Medications: I have reviewed the patient's current medications. Scheduled Meds: . amLODipine  5 mg Oral Daily  . antiseptic oral rinse  15 mL Mouth Rinse q12n4p  . atorvastatin  20 mg Oral q1800  . carvedilol   3.125 mg Oral BID WC  . chlorhexidine  15 mL Mouth Rinse BID  . ferrous sulfate  325 mg Oral TID WC  . furosemide  40 mg Oral Daily  . guaiFENesin  600 mg Oral BID  . heparin  5,000 Units Subcutaneous 3 times per day  . insulin aspart  0-20 Units Subcutaneous 6 times per day  . insulin glargine  10 Units Subcutaneous QHS  . pantoprazole  40 mg Oral BID  . [START ON 11/05/2013] predniSONE  10 mg Oral Q breakfast   Continuous Infusions:  PRN Meds:.sodium chloride, acetaminophen, ipratropium-albuterol, morphine injection, Racepinephrine HCl Assessment/Plan: Melody Gray is a 43yo woman with PMHx significant for morbid obesity, dCHF, sarcoidosis with pulmonary HTN, OSA, with trach placed in 4/15 presenting with SOB in setting of hypoxemic respiratory failure.   1. Tracheostomy status - She states that she is still getting used to her new trach and has not been able to use her voice box as well. She continues to be frustrated by her sensation of air trapping. She has returned to FiO2 40%. There is concern pt may have sleep apnea (?central). CXR on 7/5 showed no congestion. Her trach collar has been replaced by ENT, Dr. Janace Hoard, and she now has cuffed trach. Of note, the patient has an extremely difficulty airway and per ENT recommendations, neither PCCM or RT should be changing this tracheostomy. Yesterday, trach was changed to a cuffless one by ENT (Dr. Simeon Craft). Patient is much more comfortable and breathing well. - ENT changed cuff yesterday to cuffless. They recommended for the patient to follow up with Dr. Melvyn Novas (PCCM) in his trach clinic in 1-2 weeks and with Dr. Janace Hoard in 4-6 weeks.  - Will increase FIO2 overnight and try to wean to 28% during the day again   2. 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 Patient's respiratory failure was likely  multifactorial, due to volume overload (pulmonary edema on CXR), trach cuff leak, and chronic medical issues. Her respiratory status has improved since admission to the ICU where she diuresed ~3.5L.  - Continue home dose of prednisone 10mg  QD today  - Continue telemetry  - Continue ATC at 28% fio2 per above  - Continue diuresis with Lasix 40mg  PO QD with close creatinine and K monitoring  - Continue DuoNebs q4h PRN for SOB and wheezing  - Continue Mucinex for secretions  - Continue Morphine prn for deep suctioning   3.  Diastolic CHF- She came in with exacerbation as discussed above and has diuresed well so far. She is on Lasix 40mg  daily at home, Coreg 25mg  BID, and irbesartan 150mg  daily. Pt appears euvolemic today.  - Continue Lasix 40mg  PO QD  - Continue reduced dose of Coreg at 3.125 mg BID, may discuss the use of this nonselective BB with Pulmonology before increasing her dose.  - Strict I%Os  - BMET daily  - Carb mod/low sodium diet   4. Sarcoidosis - She has been on chronic prednisone therapy for years, is on prednisone 10mg  daily at home. Patient was on stress dose steroids and restarting home dose today.  - Continue Prednisone 10mg  QD   5. HTN- BP has been elevated in 140-150s. At home she is on Norvasc 5mg  daily, Coreg 25mg  BID, Lasix 40mg  daily, Irbesartan 150mg  daily.  - Continue Coreg 3.125 mg BID  - Continue Lasix 40mg  PO QD  - Continue Norvasc 5 mg QD  - Continue to monitor BP   6. DM2 - Last Hgb A1C 8.1%. At home she is on metformin 1000mg  BID ac, Novolog SSI, and Levemir 26 units in the morning and 20 units at night.  - Hold metformin while inpatient, resume at discharge  - SSI resistant  - Continue Lantus 10 units QHS  - Continue glucose checks Q4H   7. Normocytic anemia- Baseline HgB has been in the 7-8 range (as recent as may 2015) but is currently in 11-12 range (which could be 2/2 to recent iron tx and/or chronic hypoxia). She reports sputum scantly tinged with  blood but has not had recurrences of this with no bleeding during this hospitalization. Etiology to her anemia is likely multifactorial due to chronic disease (and chronic prednisone use) and iron deficiency. Anemia panel on 08/09/13 revealed low iron of 27, ferritin of 16, TIBC 495, with nl folate. She was placed on iron supplementation on 10/03/13  - Patient on home iron supplementation   8. GERD  - Continue home dose of Protonix 40mg  QD   9. Hyperlipidemia - Lipid panel from 09/12/13 with LDL of 108, her goal is <100 given hat she is diabetic.  - Continue atorvastatin 20mg  QD   DVT/PE prophylaxis: Heparin 5,000 units q 8h SQ  Diet: Carb mod/low sodium diet  Code: Full  Dispo: Patient to be discharged today.   The patient does have a current PCP Melody Pitch, Melody Gray) and does need an [facility] hospital follow-up appointment after discharge.  The patient does not have transportation limitations that hinder transportation to clinic appointments.  .Services Needed at time of discharge: Y = Yes, Blank = No PT:   OT:   RN:   Equipment:   Other:     LOS: 8 days   Albin Felling, Melody Gray 11/04/2013, 3:47 PM

## 2013-11-04 NOTE — Progress Notes (Signed)
Patient evaluated for community based chronic disease management services with [facility] as a benefit of patient's [employer]. Spoke with patient at bedside to explain [facility] services.  Patient has consented for services with written consents and her authorized contact is her sister Amily Carragher [phone].  Patient has expressed that she is having difficulty filing for Medicaid and procuring some medication.  She admits that she could be more compliant with her respiratory management.  Patient will receive a post discharge transition of care call and will be evaluated for monthly home visits for assessments and disease process education.  Left contact information and [facility] literature at bedside. Made Inpatient Case Manager aware that [facility] following. Of note, [facility] Management services does not replace or interfere with any services that are arranged by inpatient case management or social work.  For additional questions or referrals please contact Corliss Blacker BSN RN [facility] Liaison at [phone].

## 2013-11-05 DIAGNOSIS — I5032 Chronic diastolic (congestive) heart failure: Secondary | ICD-10-CM | POA: Diagnosis not present

## 2013-11-05 DIAGNOSIS — Z9981 Dependence on supplemental oxygen: Secondary | ICD-10-CM | POA: Diagnosis not present

## 2013-11-05 DIAGNOSIS — J9620 Acute and chronic respiratory failure, unspecified whether with hypoxia or hypercapnia: Secondary | ICD-10-CM | POA: Diagnosis not present

## 2013-11-05 DIAGNOSIS — E119 Type 2 diabetes mellitus without complications: Secondary | ICD-10-CM | POA: Diagnosis not present

## 2013-11-05 DIAGNOSIS — Z430 Encounter for attention to tracheostomy: Secondary | ICD-10-CM | POA: Diagnosis not present

## 2013-11-05 DIAGNOSIS — I509 Heart failure, unspecified: Secondary | ICD-10-CM | POA: Diagnosis not present

## 2013-11-06 ENCOUNTER — Encounter: Payer: Self-pay | Admitting: Physician Assistant

## 2013-11-07 ENCOUNTER — Telehealth: Payer: Self-pay | Admitting: Dietician

## 2013-11-07 DIAGNOSIS — Z9981 Dependence on supplemental oxygen: Secondary | ICD-10-CM | POA: Diagnosis not present

## 2013-11-07 DIAGNOSIS — J9620 Acute and chronic respiratory failure, unspecified whether with hypoxia or hypercapnia: Secondary | ICD-10-CM | POA: Diagnosis not present

## 2013-11-07 DIAGNOSIS — E119 Type 2 diabetes mellitus without complications: Secondary | ICD-10-CM | POA: Diagnosis not present

## 2013-11-07 DIAGNOSIS — Z430 Encounter for attention to tracheostomy: Secondary | ICD-10-CM | POA: Diagnosis not present

## 2013-11-07 DIAGNOSIS — I509 Heart failure, unspecified: Secondary | ICD-10-CM | POA: Diagnosis not present

## 2013-11-07 DIAGNOSIS — I5032 Chronic diastolic (congestive) heart failure: Secondary | ICD-10-CM | POA: Diagnosis not present

## 2013-11-07 NOTE — Telephone Encounter (Signed)
Calling to assist with transition of care from hospital to home.  Discharge date:11-04-13 Call date: 11-07-13 Hospital follow up appointment date: 11-11-13 with Dr. Eula Fried at 2:15 PM  Was patient able to fill all prescriptions?yes- but needs refills, she'll ask her pharmacy to request them.   Is patient aware of hospital follow up appointments? yes Are there any problems with transportation? No Other problems/concerns:She denies any at this time.

## 2013-11-08 DIAGNOSIS — J9620 Acute and chronic respiratory failure, unspecified whether with hypoxia or hypercapnia: Secondary | ICD-10-CM | POA: Diagnosis not present

## 2013-11-08 DIAGNOSIS — E119 Type 2 diabetes mellitus without complications: Secondary | ICD-10-CM | POA: Diagnosis not present

## 2013-11-08 DIAGNOSIS — I509 Heart failure, unspecified: Secondary | ICD-10-CM | POA: Diagnosis not present

## 2013-11-08 DIAGNOSIS — I5032 Chronic diastolic (congestive) heart failure: Secondary | ICD-10-CM | POA: Diagnosis not present

## 2013-11-08 DIAGNOSIS — Z9981 Dependence on supplemental oxygen: Secondary | ICD-10-CM | POA: Diagnosis not present

## 2013-11-08 DIAGNOSIS — Z430 Encounter for attention to tracheostomy: Secondary | ICD-10-CM | POA: Diagnosis not present

## 2013-11-10 DIAGNOSIS — I509 Heart failure, unspecified: Secondary | ICD-10-CM | POA: Diagnosis not present

## 2013-11-10 DIAGNOSIS — J9620 Acute and chronic respiratory failure, unspecified whether with hypoxia or hypercapnia: Secondary | ICD-10-CM | POA: Diagnosis not present

## 2013-11-10 DIAGNOSIS — E119 Type 2 diabetes mellitus without complications: Secondary | ICD-10-CM | POA: Diagnosis not present

## 2013-11-10 DIAGNOSIS — Z430 Encounter for attention to tracheostomy: Secondary | ICD-10-CM | POA: Diagnosis not present

## 2013-11-10 DIAGNOSIS — Z9981 Dependence on supplemental oxygen: Secondary | ICD-10-CM | POA: Diagnosis not present

## 2013-11-10 DIAGNOSIS — I5032 Chronic diastolic (congestive) heart failure: Secondary | ICD-10-CM | POA: Diagnosis not present

## 2013-11-10 NOTE — Discharge Summary (Signed)
Attending physician discharge note: I personally interviewed and examined this patient on the day of discharge.  Medical history, presenting problems, hospital course, disposition and discharge plan accurate as recorded by resident physician Dr Clinton Gallant. Complicated 43 year old woman with morbid obesity, obstructive sleep apnea, sarcoidosis  on chronic steroidswith secondary pulmonary hypertension, admitted with acute respiratory failure following a recent hospital admission during which she required an emergency tracheotomy for acute respiratory failure.  Current decompensation felt like secondary to an air leak from a cuffed tracheostomy tube complicated by an exacerbation of acute on chronic diastolic heart failure. She responded promptly to oxygen and diuresis.  She was followed closely by ENT Surgery. She was changed to a non cuffed trach tube before discharge which she tolerated much better. She is discharged in good condition.  Melody Hopper, MD, [place]  Hematology-Oncology/Internal Medicine

## 2013-11-11 ENCOUNTER — Encounter: Payer: Self-pay | Admitting: Internal Medicine

## 2013-11-11 ENCOUNTER — Ambulatory Visit (INDEPENDENT_AMBULATORY_CARE_PROVIDER_SITE_OTHER): Payer: Medicare Other | Admitting: Internal Medicine

## 2013-11-11 ENCOUNTER — Other Ambulatory Visit: Payer: Self-pay | Admitting: Internal Medicine

## 2013-11-11 VITALS — BP 123/81 | HR 90 | Temp 97.8°F | Ht 61.0 in | Wt 243.8 lb

## 2013-11-11 DIAGNOSIS — Z930 Tracheostomy status: Secondary | ICD-10-CM | POA: Diagnosis not present

## 2013-11-11 DIAGNOSIS — K219 Gastro-esophageal reflux disease without esophagitis: Secondary | ICD-10-CM | POA: Diagnosis not present

## 2013-11-11 DIAGNOSIS — E119 Type 2 diabetes mellitus without complications: Secondary | ICD-10-CM

## 2013-11-11 DIAGNOSIS — I5031 Acute diastolic (congestive) heart failure: Secondary | ICD-10-CM | POA: Diagnosis not present

## 2013-11-11 DIAGNOSIS — I379 Nonrheumatic pulmonary valve disorder, unspecified: Secondary | ICD-10-CM | POA: Diagnosis not present

## 2013-11-11 DIAGNOSIS — I10 Essential (primary) hypertension: Secondary | ICD-10-CM

## 2013-11-11 DIAGNOSIS — J9610 Chronic respiratory failure, unspecified whether with hypoxia or hypercapnia: Secondary | ICD-10-CM | POA: Diagnosis not present

## 2013-11-11 DIAGNOSIS — F4321 Adjustment disorder with depressed mood: Secondary | ICD-10-CM

## 2013-11-11 DIAGNOSIS — I5030 Unspecified diastolic (congestive) heart failure: Secondary | ICD-10-CM | POA: Diagnosis not present

## 2013-11-11 DIAGNOSIS — D649 Anemia, unspecified: Secondary | ICD-10-CM | POA: Diagnosis not present

## 2013-11-11 DIAGNOSIS — I509 Heart failure, unspecified: Secondary | ICD-10-CM

## 2013-11-11 DIAGNOSIS — F17200 Nicotine dependence, unspecified, uncomplicated: Secondary | ICD-10-CM

## 2013-11-11 DIAGNOSIS — J9620 Acute and chronic respiratory failure, unspecified whether with hypoxia or hypercapnia: Secondary | ICD-10-CM | POA: Diagnosis not present

## 2013-11-11 DIAGNOSIS — R0902 Hypoxemia: Secondary | ICD-10-CM | POA: Diagnosis not present

## 2013-11-11 DIAGNOSIS — D869 Sarcoidosis, unspecified: Secondary | ICD-10-CM | POA: Diagnosis not present

## 2013-11-11 DIAGNOSIS — E785 Hyperlipidemia, unspecified: Secondary | ICD-10-CM | POA: Diagnosis not present

## 2013-11-11 DIAGNOSIS — J9611 Chronic respiratory failure with hypoxia: Secondary | ICD-10-CM

## 2013-11-11 LAB — GLUCOSE, CAPILLARY: Glucose-Capillary: 251 mg/dL — ABNORMAL HIGH (ref 70–99)

## 2013-11-11 NOTE — Patient Instructions (Addendum)
Dear ms. Noll,   im happy to see you out of the hospital.   Please follow up with ENT to see the site and follow up with Drs. Wert and Crenshaw  Please have the advanced care person call us with what oxygen she needs  Follow up with me when ready

## 2013-11-11 NOTE — Progress Notes (Signed)
Subjective:   Patient ID: Melody Gray female   DOB: 1971-01-16 43 y.o.   MRN: [id number]  HPI: [name] is a 43 y.o. female with extensive PMH as listed below presenting to opc today for hospital follow up. She was recently discharged from the inpatient service on 11/04/13 for acute on chronic respiratory failure. Trach collar replaced by ENT, Dr. Janace Hoard and continues on size 6 cuffless trach. She will need to follow up with ENT in 4-6 weeks and maybe change to size 4 per discharge summary. She reports doing well since discharge, however, she is noticing a small ?polyp inside her trach site when she is cleaning that is slightly bothersome. She cannot remove her trach right now to show me because she says it is difficult to place back but she needs to get it seen by pulmonary sooner than later. Otherwise, she reports stable respiratory status but she is having cravings to smoke and feels depressed and irritated at times. This is likely from her steroids and being in the hospital for so long and she expects it to improve. She denies suicidal or homicidal ideation. She is irritated with her boyfriend who smokes marijuana in the house even though she asks him not too and that smoke makes her crave cigarettes.   dCHF--changed from coreg 25mg  bid to coreg 3.125mg  bid on discharge, continued on lasix and irbesartan. Weight is stable at 240. No worsening of respiratory symptoms. She is on her portable oxygen and compliant with her medications.   Sarcoidosis--remains on prednisone 10mg  qd. She was unhappy with her increase in steroids during admission and that she was not tapered down but abruptly down to her 10mg .   Past Medical History  Diagnosis Date  . Sarcoidosis     Followed by Dr. Melvyn Novas; w/ liver involvement per biopsy 12/09, Reversible airway component so started on [facility] 01/2010; HFA 75% p coaching 05/2010  . Hypoxemia     CT angiogram 9/11>> No PE; PFTs 10/11- FEV1 1.20 (49%) with 16%  better p B2, DLCO 33%> corrects to 84; O2 sats ok on 4 lpm X rapid walk X 3 laps 05/2010  . Morbid obesity     Target wt= 153 for BMI <30  . QT prolongation   . Diabetes mellitus   . Hypertension   . Hx of cardiac cath 2/08    No CAD, no RAS,  normal EF  . Seborrheic dermatitis of scalp   . 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 Hep B serology neg. but Hep C ab +, HIV neg. 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 Cardiomyopathy, nonischemic     EF 45% 12/10; Echo 7/11 normal EF, PAS 48  . Diabetic retinopathy     Right eye 2/11  . Health maintenance examination     Mammogram 05/2010 Negative; Last Pap smear 03/2008; Last DM eye exam 2/11> mild non-proliferative diabetic retinopathy. OD  . Helicobacter pylori ab+ 05/2011    Pt was symptomatic and treatment planned for 05/2011  . CHF (congestive heart failure)    Current Outpatient Prescriptions  Medication Sig Dispense Refill  . ACCU-CHEK FASTCLIX LANCETS MISC Check blood sugar 3 times a day as instructed  102 each  5  . amLODipine (NORVASC) 5 MG tablet Take 1 tablet (5 mg total) by mouth daily.  30 tablet  5  . atorvastatin (LIPITOR) 20  MG tablet Take 1 tablet (20 mg total) by mouth daily at 6 PM.  30 tablet  0  . carvedilol (COREG) 3.125 MG tablet Take 1 tablet (3.125 mg total) by mouth 2 (two) times daily with a meal.  30 tablet  0  . ferrous sulfate 325 (65 FE) MG tablet Take 1 tablet (325 mg total) by mouth 3 (three) times daily with meals.  90 tablet  3  . furosemide (LASIX) 40 MG tablet Take 1 tablet (40 mg total) by mouth daily.  30 tablet  1  . glucose blood (ACCU-CHEK SMARTVIEW) test strip Check blood sugar 3 times a day as instructed  100 each  5  . insulin aspart (NOVOLOG) 100 UNIT/ML injection CBG < 70: implement hypoglycemia protocol CBG 70 - 130: 0 units CBG 131 - 180: 0 unit CBG 181-240: 2 units CBG 241-300 4 units  CBG 301-350 6 units CBG  351-400 8 units  10 mL  3  . insulin detemir (LEVEMIR) 100 UNIT/ML injection Inject 20-26 Units into the skin at bedtime. 26 units every morning and 20 units every night      . irbesartan (AVAPRO) 150 MG tablet Take 150 mg by mouth daily.      . Melatonin 3 MG TABS Take 3 mg by mouth at bedtime.      . metFORMIN (GLUCOPHAGE) 1000 MG tablet Take 1,000 mg by mouth 2 (two) times daily with a meal.      . predniSONE (DELTASONE) 10 MG tablet take 1 tablet by mouth once daily WITH BREAKFAST  30 tablet  2  . guaiFENesin (MUCINEX) 600 MG 12 hr tablet Take 1 tablet (600 mg total) by mouth 2 (two) times daily.  60 tablet  2  . pantoprazole (PROTONIX) 40 MG tablet Take 40 mg by mouth 2 (two) times daily.       No current facility-administered medications for this visit.   Family History  Problem Relation Age of Onset  . Cancer Mother     colon cancer  . Multiple sclerosis Father   . Asthma Sister     in childhood  . Diabetes Father   . Hypertension     History   Social History  . Marital Status: Single    Spouse Name: N/A    Number of Children: 2  . Years of Education: N/A   Occupational History  . 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 Smoking status: Former Smoker -- 0.30 packs/day for 20 years  . Smokeless tobacco: Never Used     Comment: Needs to cut back.  . Alcohol Use: No  . Drug Use: No  . Sexual Activity: None   Other Topics Concern  . None   Social History Narrative   Diabetic card given 05/03/2010.   Financial assistance approved for 100% discount at [facility] and has [facility] card. Melody Gray 12/07/2009.      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   Review of Systems:  Constitutional:  Denies fever, chills  HEENT:  Trach in place but bothersome at times.    Respiratory:  DOE. On oxygen  Cardiovascular:  Denies chest pain  Gastrointestinal:  Denies nausea, vomiting, abdominal pain   Genitourinary:  Denies dysuria     Musculoskeletal:  Denies myalgias  Skin:  Denies pallor, rash and wound.   Neurological:  Denies dizziness, seizures, syncope    Objective:  Physical Exam:  Filed Vitals:   11/11/13 1408  BP: 123/81  Pulse: 90  Temp: 97.8 F (36.6 C)  TempSrc: Oral  Height: 5\' 1"  (1.549 m)  Weight: 243 lb 12.8 oz (110.587 kg)  SpO2: 96%   Vitals reviewed. General: sitting in chair, NAD HEENT: EOMI, trach in place Cardiac: RRR Pulm: clear to auscultation bilaterally Abd: soft, obese, nondistended, BS present Ext: warm and well perfused, moving all 4 extremities Neuro: alert and oriented X3, strength and sensation to light touch equal in bilateral upper and lower extremities  Assessment & Plan:  Discussed with Dr. Dareen Piano Needs to follow up with ENT and pulmonary

## 2013-11-12 DIAGNOSIS — Z930 Tracheostomy status: Secondary | ICD-10-CM | POA: Diagnosis not present

## 2013-11-12 DIAGNOSIS — Z430 Encounter for attention to tracheostomy: Secondary | ICD-10-CM | POA: Diagnosis not present

## 2013-11-13 ENCOUNTER — Telehealth: Payer: Self-pay | Admitting: *Deleted

## 2013-11-13 ENCOUNTER — Telehealth: Payer: Self-pay | Admitting: Internal Medicine

## 2013-11-13 DIAGNOSIS — I5032 Chronic diastolic (congestive) heart failure: Secondary | ICD-10-CM | POA: Diagnosis not present

## 2013-11-13 DIAGNOSIS — E119 Type 2 diabetes mellitus without complications: Secondary | ICD-10-CM | POA: Diagnosis not present

## 2013-11-13 DIAGNOSIS — I509 Heart failure, unspecified: Secondary | ICD-10-CM | POA: Diagnosis not present

## 2013-11-13 DIAGNOSIS — F4321 Adjustment disorder with depressed mood: Secondary | ICD-10-CM | POA: Insufficient documentation

## 2013-11-13 DIAGNOSIS — J9620 Acute and chronic respiratory failure, unspecified whether with hypoxia or hypercapnia: Secondary | ICD-10-CM | POA: Diagnosis not present

## 2013-11-13 DIAGNOSIS — Z9981 Dependence on supplemental oxygen: Secondary | ICD-10-CM | POA: Diagnosis not present

## 2013-11-13 DIAGNOSIS — Z430 Encounter for attention to tracheostomy: Secondary | ICD-10-CM | POA: Diagnosis not present

## 2013-11-13 NOTE — Assessment & Plan Note (Signed)
Lab Results  Component Value Date   HGBA1C 8.1* 08/30/2013   HGBA1C 6.5 05/20/2013   HGBA1C 6.3 09/03/2012    Assessment: Diabetes control:   Progress toward A1C goal:   recheck august Comments: did not bring meter today  Plan: Medications:  Continue current mediations with sliding scale novolog and levemir 26 AM and 20 PM, metformin Home glucose monitoring: Frequency:   Timing:  three times a day Instruction/counseling given: encouraged to bring meter next visit Educational resources provided:   Self management tools provided:   Other plans: denies hypoglycemic episdoes

## 2013-11-13 NOTE — Assessment & Plan Note (Signed)
Weight up due to heavy steroids during admission.   She is upset about weight gain but hopeful she can get it back down

## 2013-11-13 NOTE — Telephone Encounter (Signed)
Patient has been contacted by pulmonary office (see note) and will be seen there tomorrow.

## 2013-11-13 NOTE — Assessment & Plan Note (Signed)
Doing well since hospital d/c on portable oxygen.  Melody Gray is bothersome at times and reports feeling a small ?polyp inside site that she would like Dr. Melvyn Novas or ENT to look at. Her appointments are scheduled a little far away but she will try to be seen sooner.  I congratulated her on smoking cessation and encouraged her to continue.

## 2013-11-13 NOTE — Assessment & Plan Note (Signed)
Currently weight stable in 240's. She does not feel volume up and no acute on chronic respiratory dress.  -taking lasix 40 daily -BB dose was recently reduced to coreg 3.125mg  bid in setting of ARF -on ARB and CCB -follow up with Dr. Stanford Breed

## 2013-11-13 NOTE — Telephone Encounter (Signed)
Spoke with Margaretha Sheffield- home health nurse States that O2 sats have been ranging low about 88% Seen by PCP 11/12/13 Pressure with new tracheostomy-- patient opens trach to relieve pressure. Color/complection in patient is good No increased breathing issues noted other than Some SOB with exertion only  Advised by PCP to follow up with Dr Melvyn Novas asap Aware that Dr Melvyn Novas not in office until next week--requests that patient be seen sooner Scheduled with [facility] 11/14/13 at 11am   Nothing further needed.

## 2013-11-13 NOTE — Telephone Encounter (Signed)
Pt called back and she has not heard from Dr Gustavus Bryant office. She is feeling better and states the problem is when her valve is in place.   She denies SOB. She was instructed to go to ED if she gets SOB or not feeling well. She said she knows what to watch for and will go if needed.

## 2013-11-13 NOTE — Telephone Encounter (Signed)
Thank you!  i see the pulm triage note that has her scheduled for 11am tomorrow. If she is not aware of this appointment, can we please call her first this tomorrow morning to tell her to make this appointment at their office?  Thank you!  Dr q

## 2013-11-13 NOTE — Telephone Encounter (Signed)
Call from [place], [place] with [facility] _ [phone]  Nurse reports she saw pt today and O2 sats are 85 - 88%  No distress, color good.  The new trach makes pt feel she has the feeling of pressure built up.   She sees Dr Melvyn Novas on 7/22 and Dr Lucia Gaskins 7/21 Weight 238  Pt seen in clinic on 7/14 and sats were 96%  I talked with Dr Eula Fried and she asked me to call Dr Gustavus Bryant office and see if they could see pt sooner.  I called Dr Gustavus Bryant office and they will have the triage nurse call pt and evaluate.

## 2013-11-13 NOTE — Assessment & Plan Note (Signed)
Remains off cigarettes but having cravings.

## 2013-11-13 NOTE — Assessment & Plan Note (Signed)
BP Readings from Last 3 Encounters:  11/11/13 123/81  11/04/13 135/73  10/08/13 104/64    Lab Results  Component Value Date   NA 136* 11/03/2013   K 4.7 11/03/2013   CREATININE 0.73 11/03/2013    Assessment: Blood pressure control:   Progress toward BP goal:    Comments: well controlled  Plan: Medications: continue avapro 150, norvasc 5, coreg 3.125mg  bid (recently decreased during admission) lasix 40  Educational resources provided:   Self management tools provided:    Other plans: bring medications to visit. Follow up with Dr. Stanford Breed may want to bring BB dose back up

## 2013-11-14 ENCOUNTER — Ambulatory Visit (INDEPENDENT_AMBULATORY_CARE_PROVIDER_SITE_OTHER): Payer: Medicare Other | Admitting: Internal Medicine

## 2013-11-14 ENCOUNTER — Encounter: Payer: Self-pay | Admitting: Internal Medicine

## 2013-11-14 VITALS — BP 120/74 | HR 105 | Ht 61.0 in | Wt 241.0 lb

## 2013-11-14 DIAGNOSIS — I2729 Other secondary pulmonary hypertension: Secondary | ICD-10-CM

## 2013-11-14 DIAGNOSIS — D869 Sarcoidosis, unspecified: Secondary | ICD-10-CM

## 2013-11-14 DIAGNOSIS — J9610 Chronic respiratory failure, unspecified whether with hypoxia or hypercapnia: Secondary | ICD-10-CM | POA: Diagnosis not present

## 2013-11-14 DIAGNOSIS — J9620 Acute and chronic respiratory failure, unspecified whether with hypoxia or hypercapnia: Secondary | ICD-10-CM

## 2013-11-14 DIAGNOSIS — I2789 Other specified pulmonary heart diseases: Secondary | ICD-10-CM | POA: Diagnosis not present

## 2013-11-14 DIAGNOSIS — R0902 Hypoxemia: Secondary | ICD-10-CM

## 2013-11-14 DIAGNOSIS — J9611 Chronic respiratory failure with hypoxia: Secondary | ICD-10-CM

## 2013-11-14 NOTE — Assessment & Plan Note (Signed)
Chronic resp failure w hypoxia

## 2013-11-14 NOTE — Telephone Encounter (Signed)
Pt arrived at her appointment!!

## 2013-11-14 NOTE — Patient Instructions (Signed)
Order- DME Advanced- patient needs portable O2 system able to provide 3L continuous         Dx Sarcoid, Acute and Chronic hypercapneic respiratory failure  Use your trach collar if your have the PMV speaking valve off, so the oxygen goes in through the tracheostomy  Try lifting the end of your tube with a rolled up washcloth, to keep it from getting angled and blocked in your airway  Please call as needed

## 2013-11-14 NOTE — Assessment & Plan Note (Signed)
Repeat ECHO 10/2013 noted, charted

## 2013-11-14 NOTE — Assessment & Plan Note (Signed)
6 uncuffed. It sounds as if tube tilts in lumen so distal end partially occludes against posterior tracheal wall. She can lift it manually and breathe comfortably Plan- Prop up tracheostomy tube with rolled washcloth to better align with lumen- taught. Keep trach f/u with ENT/Dr Simeon Craft as planned. Keep pulmonary f/u w Dr Melvyn Novas. Change portable O2 to 3L continuous- ordered.

## 2013-11-14 NOTE — Progress Notes (Signed)
Subjective:     Patient ID: Melody Gray, female   DOB: [birth year]   MRN: [id number]   Brief patient profile:  50 yobf  Smoker dx of sarcoidosis in 2001 = sob, cough became 02 dep in July [id number] complicated by [facility]  last on chronic  prednsione 01/2012      History of Present Illness   January 06, 2010 ov  c/o increased SOB. Pt states outpt clinic advised her to follow up with Dr. Melvyn Novas for Sacoidosis. doe on 02 at 2 lpm sitting then 4 with activity gets off at the curb struggles with grocery store. Try taking Prednisone one half even days in am with breakfast  Be sure you take omeprazole Take one 30-60 min before first and last meals of the day  Stop nifedipine  Start Cardizem 240 mg once daily  Wear 24 hours per day, 2 lpm at rest and sleeping, 4lpm with activity, this is the best way to help your heart function better  CT Chest ( repeated with contrast)> no PE, just sarcoid changes   February 09, 2010 Followup sarcoid w/ PFTs. Breathing is the same- no better or worse.on 02 2 lpm w/in and then outside using 4lpm> cc doe x 50 ft still has to stop every aisle at [employer] but not [facility] parking. rec Start Dulera 2 puffs first thing in am and 2 puffs again in pm about 12 hours later and prednisone 10 mg every other day.     07/24/2013 post hosp f/u ov/Wert re: ? Sarcoid flare/ still smoking  Chief Complaint  Patient presents with  . HFU    Pt states that her breathing is no better since hospital d/c. She reports having to use her o2 all of the time. She has minimal non prod cough.   onset wasnot  abrupt, sob developed gradually  in setting of gen chest/ upper abd tight sensation anteriorly, sym bilaterally better lying down, not much cough all, no better back on prednisone rec Please see patient coordinator before you leave today  to schedule CTa chest> ? LL PE vs artifact > bialteral venous dopplers > neg / D Dimer nl  Treatment for your abdominal tightness  consists of avoiding foods that  cause gas (especially beans and raw vegetables like spinach and salads boiled eggs)  and citrucel 1 heaping tsp twice daily with a large glass of water.  Pain should improve w/in 2 weeks and if not then consider further GI work up.   Wear 02 4lpm 24/7 and stop all smoking  Prednisone 20 mg one daily x 3 days, half daily x 3 days and stop   07/31/2013 f/u ov/Wert re: last pred one day  prior to [place]  Patient presents with  . Follow-up    Pt reports her breathing is starting to improve. Still has minimal non prod cough. No new co's today.  did not start citrucel as rec and main c/o is abd tightness s change in bowel habits rec Wear 02 4lpm 24/7 and stop all smoking  Please remember to go to the lab   department downstairs for your tests - we will call you with the results when they are available. Make a food diary for the next two weeks and show it to your clinic doctor/ nutritionist    Admit date: 08/08/2013  Discharge date: 08/15/2013  Discharge Diagnoses:  Acute on Chronic Hypercarbic Respiratory Failure  Sarcoid  OSA/OHS  Emergent Tracheostomy  Tobacco  Abuse  Long standing non adherence to medical advice , see clinic records  Pulmonary Hypertension  Acute on Chronic dCHF  Hypotension  Acute on Chronic Renal Failure  Dysphagia  Hyperkalemia  Anemia  DM  Acute Encephalopathy  DISCHARGE PLAN BY DIAGNOSIS  Acute on Chronic Hypercarbic Respiratory Failure  Sarcoid  OSA/OHS  Emergent Tracheostomy  Tobacco Abuse  Long standing non adherence to medical advice , see clinic records  Pulmonary Hypertension  Discharge Plan:  -daytime ATC 40% as tolerated, PSV QHS  -prednisone taper - with wean to off over 6 weeks  -smoking cessaton  -Unfortunately expect some vocal cord/ thyroid cartilage damage here per ENT -long term effects on speech unclear, expect will need trach for a long time  -Speech following for PMV (not tol well)  -In regard to [facility], she has scarring on  her CXR. Further, before the trach she had OSA. All of these are good secondary causes of pulmonary hypertension and it's really not clear from the literature when patients with PH from sarcoid benefit from pulmonary vasodilators. Considering what she has been through recently I would prefer to treat the bronchospasm vs "sarcoid flare" (less likely) with a long course of steroids. If not returned to baseline functional status after steroids would set her up for a RHC.  -WOULD NOT DECANNULATE or DOWNSIZE at this time. We will follow for potential downsize.  Acute on Chronic dCHF  Hypotension  Discharge Plan:  -continue coreg  -hold previous home medications: Lasix 40 mg QD, diovan 40 mg QD, Avapro 300mg  QD  Acute on Chronic Renal Failure  Discharge Plan:  -trend BMP  Dysphagia  Discharge Plan:  -cleared by SLP for thin liquid, regular diet with chin tuck  -initiate heart healthy, carb modified diet  -PPI  -stop TF 08/31/2022 as pt has passed MBS with SLP, if PO intake is not sufficient, may need to consider supplementation with TF  Hyperkalemia  Discharge Plan:  -resolved, no further interventions  Anemia  Discharge Plan:  -minimize lab draws as able  -transfuse if Hgb <7%, active bleeding or MI <8%  DM  Discharge Plan:  -continue SSI + lantus  -hold home medications: Metformin, 70/30  -consider restart of home meds closer to d/c  Acute Encephalopathy  Discharge Plan:  -resolved, no further needs  -PT efforts  DISCHARGE SUMMARY  Melody Gray is a 43 y.o. y/o female, active smoker with a PMH of sarcoidosis (dx 2001 -followed by dr Melvyn Novas) on home O2 (since 2011), NICM, pulm HTN, OSA, CKD admitted 4/10 by IMTS with worsening SOB thought related to volume overload. Recently, her baseline prednisone had been stopped in March per Dr. Melvyn Novas. She was diuresed with some improvement and was ready for tx to floor. During floor admission, she was noted to have periods of apnea on bipap at night.  However, on 4/12 she declined with worsening renal function, hyperkalemia, anemia requiring transfusion, hypercarbia and placed on bipap with transfer to ICU. PCCM consulted for evaluation. She unfortunately did not tolerated BiPAP and had worsening of respiratory status in the setting of acute renal failure. ABG at that time: 7.1 / 95 / 74 / 34. Despite efforts of bipap, she required emergent intubation in the setting of hypercarbia & worsening mental status. PATIENT WAS A VERY DIFFICULT AIRWAY (she has extremely small mouth, large tongue, and very anterior larynx) - requiring efforts of CCS, Anesthesia, & CCM to obtain a temporary airway. She was then taken to the OR on  4/12 PM for revision / placement of tracheostomy. Post tracheostomy placement, she was returned to ICU. Patient was able to transition to ATC during day time and nocturnal vent support at night. Thus far, she has not been successful with PSV at night. Has required PRVC.  In the setting of acute renal failure reltated to ARB + diuresis, her home medications were held and have not been restarted. Offending agents were held, patient was gently hydrated and sr cr has returned to normal.  She was evaluated by SLP for swallowing needs. Patient passed MBS on 4/17 and was cleared for regular consistency diet, thin liquids with chin tuck. Please see discharge plans as above for details.  For Sarcoid, would plan to treat with prednisone tapered from 40mg  down to off over 6 weeks.  Regarding tracheostomy, it is helping her OSA and given her difficult airway would not consider decannulation for minimum 3-4 weeks. May want to consider lifelong trach to assist with OSA.  6/10/2015post hosp  f/u ov/ transition of care/Wert re: sarcoid / pred at 10 mg / ? Not smoking ? / now trached Chief Complaint  Patient presents with  . Follow-up    Pt states her breathing has improved. No new co's today.   did not understand purpose of PMV leaves it in 24/7 and not  using T collar at hs  Edema has resolved   Her sob is s  obvious daytime variabilty or assoc cough or  pleurritic/ex cp or  subjective wheeze overt sinus or hb symptoms. No unusual exp hx or h/o childhood pna/ asthma or premature birth to her knowledge.    Sleeping ok on 02 /PMV without nocturnal  or early am exacerbation  of respiratory  c/o's or need for noct saba. Also denies any obvious fluctuation of symptoms with weather or environmental changes or other aggravating or alleviating factors except as outlined above     ROS  The following are not active complaints unless bolded sore throat, dysphagia, dental problems, itching, sneezing,  nasal congestion or excess/ purulent secretions, ear ache,   fever, chills, sweats, unintended wt loss, pleuritic or exertional cp, hemoptysis,  orthopnea pnd or leg swelling, presyncope, palpitations, heartburn, abdominal pain, anorexia, nausea, vomiting, diarrhea  or change in bowel or urinary habits, change in stools or urine, dysuria,hematuria,  rash, arthralgias, visual complaints, headache, numbness weakness or ataxia or problems with walking or coordination,  change in mood/affect or memory.      Past Medical History:  Sarcoidosis (Dr Wert)-w/ liver involvement per biopsy 03/2008 - Reversible airway component so start [facility] 01/2010 > better  - HFa 75% p coaching May 04, 2010  -  Off chronic prednisone 05/2011 Unexplained Hypoxemia July 2011  - CT angiogram 01/07/10 >>> no PE  - PFT's February 09, 2010 FEV1 1.20 (49%) with ratio 72 and FRC 90%, FEV1  16% better p B2, DLC0 33% > corrects to 84   Morbid obesity  - Target wt = 153 for BMI < 30  - Trach 08/10/13 with very difficult airway QT prolongation  Diabetes mellitus, type II  Hypertension  Heart catheterization 06-06-06 : No CAD, no RAS, normal EF  Hx eclampsia  Hx of scalp seborrheic dermatitis   11/14/13- Acute Visit 42 yoF followed by Dr Melvyn Novas for chronic respiratory failure, Sarcoid,  tracheostomy ACUTE VISIT:  Increased sob since D/C from hospital 11/04/13.  SATS 76% upon arrival on [facility] turned up to 3L  - 94%. Her tank only goes up to  2L continuous before switching to pulsed. Family here. Hosp F/U- 6/9-11/04/13- Acute on Chronic Resp Failure. Diuresed. Changed to 6 cuffless trach/ ENT. She is to f/u w ENT bringing a #4 cuffless trach for their consideration as of discharge plan. She is identified w dificult airway and PCCM advised long-term trach. She wears trach collar O2 comfortably at night. During day feels back-pressure in upper airway, so she has been leaving PMV speaking valve off w trach to room air.. Nasal prong O2 continued. HHN today reported sat 76%, apparently with PMV on, and pt sent for eval. Patient feels the same, not acute, variable wheeze, cough prod white.  On arrival sat 76% on 2L continuous exertion, PMV. Sat rose to 94% on 3L w/ our tank, at rest. CXR 11/02/13 IMPRESSION:  Tracheostomy in satisfactory position.  No evidence of acute cardiopulmonary disease.  Electronically Signed  By: Julian Hy M.D.  On: 11/02/2013 13:41    Objective:   Physical Exam  She is a minimally  cushingoid-appearing amb bf with trach in place no stridor and good phonation with PMV wt 238 January 06, 2010 >   217 May 04, 2010 >  02/12/2012  213 >  215 04/04/2012  > 239 04/08/2013 > 07/24/2013 249 > 07/31/2013  252 >   10/08/2013  233   HEENT: normocephalic; PEERLA; Malampatti IV. Obese neck: supple / trach site looks good / PMV in place, no stridor nodes: no cervical lymphadenopathy  chest: clear to ausculatation and percussion though BS distant  heart: no murmurs, gallops, or rubs  abd: soft, nontender; BS normoactive; no abdominal masses, tenderness, organomegaly; abdomen is obese   Assessment:

## 2013-11-14 NOTE — Progress Notes (Signed)
INTERNAL MEDICINE TEACHING ATTENDING ADDENDUM - Lemond Griffee, MD: I reviewed and discussed at the time of visit with the resident Dr. Qureshi, the patient's medical history, physical examination, diagnosis and results of pertinent tests and treatment and I agree with the patient's care as documented.  

## 2013-11-17 ENCOUNTER — Encounter (HOSPITAL_COMMUNITY): Payer: Self-pay | Admitting: Emergency Medicine

## 2013-11-17 ENCOUNTER — Telehealth: Payer: Self-pay | Admitting: Internal Medicine

## 2013-11-17 ENCOUNTER — Emergency Department (HOSPITAL_COMMUNITY): Payer: Medicare Other

## 2013-11-17 ENCOUNTER — Emergency Department (HOSPITAL_COMMUNITY)
Admission: EM | Admit: 2013-11-17 | Discharge: 2013-11-18 | Disposition: A | Discharge status: Home / Self Care | Payer: Medicare Other | Attending: Emergency Medicine | Admitting: Emergency Medicine

## 2013-11-17 DIAGNOSIS — Z794 Long term (current) use of insulin: Secondary | ICD-10-CM | POA: Diagnosis not present

## 2013-11-17 DIAGNOSIS — J99 Respiratory disorders in diseases classified elsewhere: Secondary | ICD-10-CM | POA: Insufficient documentation

## 2013-11-17 DIAGNOSIS — Z9981 Dependence on supplemental oxygen: Secondary | ICD-10-CM | POA: Insufficient documentation

## 2013-11-17 DIAGNOSIS — Z9889 Other specified postprocedural states: Secondary | ICD-10-CM | POA: Insufficient documentation

## 2013-11-17 DIAGNOSIS — Z872 Personal history of diseases of the skin and subcutaneous tissue: Secondary | ICD-10-CM | POA: Diagnosis not present

## 2013-11-17 DIAGNOSIS — D869 Sarcoidosis, unspecified: Secondary | ICD-10-CM | POA: Insufficient documentation

## 2013-11-17 DIAGNOSIS — E11319 Type 2 diabetes mellitus with unspecified diabetic retinopathy without macular edema: Secondary | ICD-10-CM | POA: Diagnosis not present

## 2013-11-17 DIAGNOSIS — E119 Type 2 diabetes mellitus without complications: Secondary | ICD-10-CM | POA: Diagnosis not present

## 2013-11-17 DIAGNOSIS — I5032 Chronic diastolic (congestive) heart failure: Secondary | ICD-10-CM | POA: Diagnosis not present

## 2013-11-17 DIAGNOSIS — Z87891 Personal history of nicotine dependence: Secondary | ICD-10-CM | POA: Insufficient documentation

## 2013-11-17 DIAGNOSIS — I10 Essential (primary) hypertension: Secondary | ICD-10-CM | POA: Insufficient documentation

## 2013-11-17 DIAGNOSIS — I509 Heart failure, unspecified: Secondary | ICD-10-CM | POA: Insufficient documentation

## 2013-11-17 DIAGNOSIS — IMO0002 Reserved for concepts with insufficient information to code with codable children: Secondary | ICD-10-CM | POA: Diagnosis not present

## 2013-11-17 DIAGNOSIS — R0902 Hypoxemia: Secondary | ICD-10-CM | POA: Diagnosis present

## 2013-11-17 DIAGNOSIS — E1139 Type 2 diabetes mellitus with other diabetic ophthalmic complication: Secondary | ICD-10-CM | POA: Diagnosis not present

## 2013-11-17 DIAGNOSIS — J9620 Acute and chronic respiratory failure, unspecified whether with hypoxia or hypercapnia: Secondary | ICD-10-CM | POA: Diagnosis not present

## 2013-11-17 DIAGNOSIS — Z430 Encounter for attention to tracheostomy: Secondary | ICD-10-CM | POA: Diagnosis not present

## 2013-11-17 DIAGNOSIS — Z79899 Other long term (current) drug therapy: Secondary | ICD-10-CM | POA: Insufficient documentation

## 2013-11-17 DIAGNOSIS — D860 Sarcoidosis of lung: Secondary | ICD-10-CM

## 2013-11-17 NOTE — Telephone Encounter (Signed)
Melody Gray will be only one changing out her trach for now  Target sat should be upper 80s not lower, ok for [facility] to adjust/titrate flow for this goal

## 2013-11-17 NOTE — ED Notes (Addendum)
Pt states that she was at home and [facility] RN came to check her out. Pt O2 stats were 71%, but her oxygen was not pumping the way it should out of her home machine. Pt was placed on her portable with O2 coming out and her stats rose to 81%. RN called MD and the MD told her to come here because he wanted her stats to be in the upper 80"s. Pt is currently upper 80's on 2L portable O2. Pt has had productive cough, pt has old trach.

## 2013-11-17 NOTE — ED Notes (Addendum)
Patient states she has not had her night time dose of BP medication. She states her BP normally runs 170's over 60-70's.

## 2013-11-17 NOTE — Telephone Encounter (Signed)
Spoke with Almyra Free and notified of recs per MW She verbalized understanding  Nothing further needed

## 2013-11-17 NOTE — ED Provider Notes (Addendum)
CSN: [id number]     Arrival date & time 11/17/13  1902 History   First MD Initiated Contact with Patient 11/17/13 2303     Chief Complaint  Patient presents with  . 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 She has multiple comorbidities.  She comes to the emergency department because she feels that her home oxygen pump is not functioning adequately and delivering oxygen as it should. The patient has not had any feelings of shortness of breath. However, a home health nurse came by this evening to check the patient's vital signs. The patient says that her pulse ox was in the 80s. She typically has a pulse ox of 95-96% on her baseline 2 L of oxygen via nasal cannula.  She says she checked her oxygen tubing and did not feel it was delivering oxygen. She called the provider of the device he told her to do some troubleshooting. The patient says she was unable to do this so she called her home health nurse and her home health nurse told her to come to the emergency department.  The patient is a chronic and minimally productive cough. She has not have fever or chest pain. She has been compliant with all medications, she states.  Past Medical History  Diagnosis Date  . Sarcoidosis     Followed by Dr. Melvyn Novas; w/ liver involvement per biopsy 12/09, Reversible airway component so started on [facility] 01/2010; HFA 75% p coaching 05/2010  . Hypoxemia     CT angiogram 9/11>> No PE; PFTs 10/11- FEV1 1.20 (49%) with 16% better p B2, DLCO 33%> corrects to 84; O2 sats ok on 4 lpm X rapid walk X 3 laps 05/2010  . Morbid obesity     Target wt= 153 for BMI <30  . QT prolongation   . Diabetes mellitus   . Hypertension   . Hx of cardiac cath 2/08    No CAD, no RAS,  normal EF  . Seborrheic dermatitis of scalp   . Abnormal LFTs (liver  function tests)     Liver U/S and exam c/w HSM. Hep B serology neg. but Hep C ab +, HIV neg. 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 Cardiomyopathy, nonischemic     EF 45% 12/10; Echo 7/11 normal EF, PAS 48  . Diabetic retinopathy     Right eye 2/11  . Health maintenance examination     Mammogram 05/2010 Negative; Last Pap smear 03/2008; Last DM eye exam 2/11> mild non-proliferative diabetic retinopathy. OD  . Helicobacter pylori ab+ 05/2011    Pt was symptomatic and treatment planned for 05/2011  . CHF (congestive heart failure)    Past Surgical History  Procedure Laterality Date  . Tubal ligation    . Breast surgery    . Cesarean section    . Tracheostomy tube placement N/A 08/10/2013    Procedure: TRACHEOSTOMY;  Surgeon: Melissa Montane, MD;  Location: [facility] OR;  Service: ENT;  Laterality: N/A;   Family History  Problem Relation Age of Onset  . Cancer Mother     colon cancer  . Multiple sclerosis Father   . Asthma Sister     in childhood  . Diabetes Father   . Hypertension     History  Substance Use Topics  . Smoking status: Former Smoker --  0.30 packs/day for 20 years  . Smokeless tobacco: Never Used     Comment: Needs to cut back.  . Alcohol Use: No   OB History   Grav Para Term Preterm Abortions TAB SAB Ect Mult Living                 Review of Systems  Ten point review of symptoms performed and is negative with the exception of symptoms noted above.   Allergies  Vicodin  Home Medications   Prior to Admission medications   Medication Sig Start Date End Date Taking? Authorizing Provider  amLODipine (NORVASC) 5 MG tablet Take 1 tablet (5 mg total) by mouth daily.   Yes Jerene Pitch, MD  atorvastatin (LIPITOR) 20 MG tablet Take 1 tablet (20 mg total) by mouth daily at 6 PM. 11/04/13  Yes Clinton Gallant, MD  carvedilol (COREG) 3.125 MG tablet Take 1 tablet (3.125 mg total) by mouth 2 (two) times daily with a meal. 11/04/13  Yes  Clinton Gallant, MD  ferrous sulfate 325 (65 FE) MG tablet Take 1 tablet (325 mg total) by mouth 3 (three) times daily with meals.   Yes Jerene Pitch, MD  furosemide (LASIX) 40 MG tablet Take 1 tablet (40 mg total) by mouth daily. 09/09/13 09/09/14 Yes Jerene Pitch, MD  insulin aspart (NOVOLOG) 100 UNIT/ML injection Inject 2-8 Units into the skin 3 (three) times daily before meals. Sliding scale   Yes Historical Provider, MD  insulin detemir (LEVEMIR) 100 UNIT/ML injection Inject 20-26 Units into the skin at bedtime. 26 units every morning and 20 units every night   Yes Historical Provider, MD  irbesartan (AVAPRO) 150 MG tablet Take 150 mg by mouth daily. 09/16/13 09/16/14 Yes Neema Bobbie Stack, MD  Melatonin 3 MG TABS Take 3 mg by mouth at bedtime.   Yes Historical Provider, MD  metFORMIN (GLUCOPHAGE) 1000 MG tablet Take 1,000 mg by mouth 2 (two) times daily with a meal.   Yes Historical Provider, MD  omeprazole (PRILOSEC) 20 MG capsule Take 20 mg by mouth daily.   Yes Historical Provider, MD  predniSONE (DELTASONE) 10 MG tablet take 1 tablet by mouth once daily WITH BREAKFAST 11/11/13  Yes Tanda Rockers, MD  ACCU-CHEK FASTCLIX LANCETS MISC Check blood sugar 3 times a day as instructed 10/01/13   Jerene Pitch, MD  glucose blood (ACCU-CHEK SMARTVIEW) test strip Check blood sugar 3 times a day as instructed 10/01/13   Jerene Pitch, MD  guaiFENesin (MUCINEX) 600 MG 12 hr tablet Take 1 tablet (600 mg total) by mouth 2 (two) times daily. 11/04/13   Clinton Gallant, MD   BP 178/122  Pulse 103  Temp(Src) 98.4 F (36.9 C) (Oral)  Wt 240 lb 4 oz (108.977 kg)  SpO2 99%  LMP 10/14/2013 Physical Exam Gen: well developed and well nourished appearing patient is a cushingoid appearance. Head: NCAT Eyes: PERL, EOMI Nose: no epistaixis or rhinorrhea Mouth/throat: mucosa is moist and pink Neck: supple, no stridor Lungs: Bibasilar coarse rales, respiratory rate 20-24, no wheezing. No signs of respiratory distress CV:  regular rate and rythm, good distal pulses.  Abd: soft, obese, notender, nondistended Back: no ttp, no cva ttp Skin: warm and dry Ext: no edema, normal to inspection Neuro: CN ii-xii grossly intact, no focal deficits Psyche; normal affect,  calm and cooperative. ED Course  Procedures (including critical care time) Labs Review    Imaging Review Dg Chest 2 View  11/17/2013   CLINICAL DATA:  Tracheostomy patient.  Hypoxemia. History of CABG, diabetes, sarcoidosis and smoking.  EXAM: CHEST  2 VIEW  COMPARISON:  Radiographs 11/02/2013 and 10/30/2013.  CT 07/25/2013.  FINDINGS: Tracheostomy appears well positioned. The heart size and mediastinal contours are stable with cardiomegaly and evidence of persistent lymphadenopathy. There are chronic interstitial opacities at both lung bases. No superimposed airspace disease or significant pleural effusion is seen. There are surgical clips in the left axilla. The osseous structures appear unchanged.  IMPRESSION: Stable cardiomegaly, adenopathy and chronic interstitial lung disease. No acute cardiopulmonary process.   Electronically Signed   By: Camie Patience M.D.   On: 11/17/2013 20:33     MDM   Patient has good 02 sats of 95 to 97% on baseline of 2L/min O2 via [place]. We will try to get ahold of [place] with request to send a technician to the patient's home to address her concern regarding the functionality of her home oxygen delivery apparatus.     Elyn Peers, MD 11/18/13 0040  0057: spoke with rep from [place] who will call me back with an appt time for their maintenance team to come out and evaluate patient's system. We will observe in the ED and discharge to coordinate with eval of home system.   Elyn Peers, MD 11/18/13 [phone]  Further discussion with [place].  Skip Estimable - administrator of respiratory therapists, may be called at [phone]. He is going to set up home evaluation of the patient's 02 system  by a respiratory therapist. His direct line is [phone], [phone]  Elyn Peers, MD 11/18/13 [phone]

## 2013-11-17 NOTE — Telephone Encounter (Signed)
Called and spoke with Almyra Free from [facility]---she wanted to let MW know the following:   Pt has been asymptomatic with her oxygen levels at 84% with the 3 liters of oxygen cont.  The pt stated that this is normal for her and that she did not need to go to the ER.  Pt is very concerned about coming in on Wednesday and is worried that her trach is going to be changed at this visit.  Pt is very anxious about this and when her trach is going to be changed she will need to have something to help calm her down due to past trach changes.  Will forward to MW for further recs if any.    Allergies  Allergen Reactions  . Vicodin [Hydrocodone-Acetaminophen] Itching    Current Outpatient Prescriptions on File Prior to Visit  Medication Sig Dispense Refill  . ACCU-CHEK FASTCLIX LANCETS MISC Check blood sugar 3 times a day as instructed  102 each  5  . amLODipine (NORVASC) 5 MG tablet Take 1 tablet (5 mg total) by mouth daily.  30 tablet  5  . atorvastatin (LIPITOR) 20 MG tablet Take 1 tablet (20 mg total) by mouth daily at 6 PM.  30 tablet  0  . carvedilol (COREG) 3.125 MG tablet Take 1 tablet (3.125 mg total) by mouth 2 (two) times daily with a meal.  30 tablet  0  . ferrous sulfate 325 (65 FE) MG tablet Take 1 tablet (325 mg total) by mouth 3 (three) times daily with meals.  90 tablet  3  . furosemide (LASIX) 40 MG tablet Take 1 tablet (40 mg total) by mouth daily.  30 tablet  1  . glucose blood (ACCU-CHEK SMARTVIEW) test strip Check blood sugar 3 times a day as instructed  100 each  5  . guaiFENesin (MUCINEX) 600 MG 12 hr tablet Take 1 tablet (600 mg total) by mouth 2 (two) times daily.  60 tablet  2  . insulin aspart (NOVOLOG) 100 UNIT/ML injection CBG < 70: implement hypoglycemia protocol CBG 70 - 130: 0 units CBG 131 - 180: 0 unit CBG 181-240: 2 units CBG 241-300 4 units  CBG 301-350 6 units CBG 351-400 8 units  10 mL  3  . insulin detemir (LEVEMIR) 100 UNIT/ML injection Inject 20-26 Units into the skin  at bedtime. 26 units every morning and 20 units every night      . irbesartan (AVAPRO) 150 MG tablet Take 150 mg by mouth daily.      . Melatonin 3 MG TABS Take 3 mg by mouth at bedtime.      . metFORMIN (GLUCOPHAGE) 1000 MG tablet Take 1,000 mg by mouth 2 (two) times daily with a meal.      . pantoprazole (PROTONIX) 40 MG tablet Take 40 mg by mouth 2 (two) times daily.      . predniSONE (DELTASONE) 10 MG tablet take 1 tablet by mouth once daily WITH BREAKFAST  30 tablet  2   No current facility-administered medications on file prior to visit.

## 2013-11-18 ENCOUNTER — Telehealth: Payer: Self-pay | Admitting: Internal Medicine

## 2013-11-18 DIAGNOSIS — E119 Type 2 diabetes mellitus without complications: Secondary | ICD-10-CM | POA: Diagnosis not present

## 2013-11-18 DIAGNOSIS — I509 Heart failure, unspecified: Secondary | ICD-10-CM | POA: Diagnosis not present

## 2013-11-18 DIAGNOSIS — I5032 Chronic diastolic (congestive) heart failure: Secondary | ICD-10-CM | POA: Diagnosis not present

## 2013-11-18 DIAGNOSIS — Z9981 Dependence on supplemental oxygen: Secondary | ICD-10-CM | POA: Diagnosis not present

## 2013-11-18 DIAGNOSIS — Z430 Encounter for attention to tracheostomy: Secondary | ICD-10-CM | POA: Diagnosis not present

## 2013-11-18 DIAGNOSIS — J9620 Acute and chronic respiratory failure, unspecified whether with hypoxia or hypercapnia: Secondary | ICD-10-CM | POA: Diagnosis not present

## 2013-11-18 NOTE — ED Notes (Signed)
Patient family has brought her portable oxygen tank. Patient states she wants to get home to meet [facility].

## 2013-11-18 NOTE — Discharge Instructions (Signed)
[facility] WILL SEND A RESPIRATORY THERAPIST TO YOUR HOME TO EVALUATE YOUR OXYGEN DELIVERY SYSTEM.   BERNIE DOTSON CAN BE CONTACT IF THERE ARE ANY QUESTIONS. HE CAN BE REACHED AT [phone], [phone] [phone]

## 2013-11-18 NOTE — Telephone Encounter (Signed)
Called spoke with pt. She went to ED yesterday due to low O2 levels. She has pending appt with MW tomorrow at 9:15. She is now at home resting. FYI for mw

## 2013-11-18 NOTE — ED Notes (Signed)
Patient is not being discharged until around 8 am when her home oxygen system can be fixed.

## 2013-11-18 NOTE — ED Notes (Signed)
Assisted patient to restroom with oxygen.

## 2013-11-19 ENCOUNTER — Ambulatory Visit (INDEPENDENT_AMBULATORY_CARE_PROVIDER_SITE_OTHER): Payer: Medicare Other | Admitting: Internal Medicine

## 2013-11-19 ENCOUNTER — Encounter: Payer: Self-pay | Admitting: Internal Medicine

## 2013-11-19 VITALS — BP 102/58 | HR 92 | Temp 98.5°F | Ht 61.0 in | Wt 242.0 lb

## 2013-11-19 DIAGNOSIS — R059 Cough, unspecified: Secondary | ICD-10-CM | POA: Diagnosis not present

## 2013-11-19 DIAGNOSIS — J9612 Chronic respiratory failure with hypercapnia: Secondary | ICD-10-CM

## 2013-11-19 DIAGNOSIS — R05 Cough: Secondary | ICD-10-CM | POA: Insufficient documentation

## 2013-11-19 DIAGNOSIS — J9610 Chronic respiratory failure, unspecified whether with hypoxia or hypercapnia: Secondary | ICD-10-CM

## 2013-11-19 DIAGNOSIS — I2789 Other specified pulmonary heart diseases: Secondary | ICD-10-CM | POA: Diagnosis not present

## 2013-11-19 DIAGNOSIS — I2729 Other secondary pulmonary hypertension: Secondary | ICD-10-CM

## 2013-11-19 DIAGNOSIS — D869 Sarcoidosis, unspecified: Secondary | ICD-10-CM

## 2013-11-19 MED ORDER — FAMOTIDINE 20 MG PO TABS: ORAL_TABLET | ORAL | Status: DC

## 2013-11-19 MED ORDER — MUCINEX DM MAXIMUM STRENGTH 60-1200 MG PO TB12: ORAL_TABLET | ORAL | Status: DC

## 2013-11-19 NOTE — Patient Instructions (Addendum)
For cough > mucinex dm up 1200 mg every 12 hours as needed   Add pepcid 20 mg and chlortrimeton (over the counter) 4 mg at bedtime to see if helps cough at night   Continue omeprazole 20 mg Take 30-60 min before first meal of the day   GERD (REFLUX)  is an extremely common cause of respiratory symptoms, many times with no significant heartburn at all.    It can be treated with medication, but also with lifestyle changes including avoidance of late meals, excessive alcohol, smoking cessation, and avoid fatty foods, chocolate, peppermint, colas, red wine, and acidic juices such as orange juice.  NO MINT OR MENTHOL PRODUCTS SO NO COUGH DROPS  USE SUGARLESS CANDY INSTEAD (jolley ranchers or Stover's)  NO OIL BASED VITAMINS - use powdered substitutes.    Please see patient coordinator before you leave today  to schedule Janace Hoard   See Tammy NP in 4  weeks with all your medications, even over the counter meds, separated in two separate bags, the ones you take no matter what vs the ones you stop once you feel better and take only as needed when you feel you need them.   Tammy  will generate for you a new user friendly medication calendar that will put [name] all on the same page re: your medication use.     Without this process, it simply isn't possible to assure that we are providing  your outpatient care  with  the attention to detail we feel you deserve.   If we cannot assure that you're getting that kind of care,  then we cannot manage your problem effectively from this clinic.  Once you have seen Tammy and we are sure that we're all on the same page with your medication use she will arrange follow up with me.

## 2013-11-19 NOTE — Progress Notes (Signed)
Subjective:     Patient ID: Melody Gray, female   DOB: September 30, 1970   MRN: [id number]   Brief patient profile:  71 yobf  Quit smoking 07/2013  dx of sarcoidosis in 2001 = sob, cough became 02 dep in July [id number] complicated by [facility] and morbid obesity with OHS requiring trach 07/2013    History of Present Illness   January 06, 2010 ov  c/o increased SOB. Pt states outpt clinic advised her to follow up with Dr. Melvyn Novas for Sacoidosis. doe on 02 at 2 lpm sitting then 4 with activity gets off at the curb struggles with grocery store. Try taking Prednisone one half even days in am with breakfast  Be sure you take omeprazole Take one 30-60 min before first and last meals of the day  Stop nifedipine  Start Cardizem 240 mg once daily  Wear 24 hours per day, 2 lpm at rest and sleeping, 4lpm with activity, this is the best way to help your heart function better  CT Chest ( repeated with contrast)> no PE, just sarcoid changes   February 09, 2010 Followup sarcoid w/ PFTs. Breathing is the same- no better or worse.on 02 2 lpm w/in and then outside using 4lpm> cc doe x 50 ft still has to stop every aisle at [employer] but not [facility] parking. rec Start Dulera 2 puffs first thing in am and 2 puffs again in pm about 12 hours later and prednisone 10 mg every other day.        07/24/2013 post hosp f/u ov/Zira Helinski re: ? Sarcoid flare/ still smoking  Chief Complaint  Patient presents with  . HFU    Pt states that her breathing is no better since hospital d/c. She reports having to use her o2 all of the time. She has minimal non prod cough.   onset was not  abrupt, sob developed gradually  in setting of gen chest/ upper abd tight sensation anteriorly, sym bilaterally better lying down, not much cough all, no better back on prednisone rec Please see patient coordinator before you leave today  to schedule CTa chest> ? LL PE vs artifact > bialteral venous dopplers > neg / D Dimer nl  Treatment for your abdominal tightness   consists of avoiding foods that cause gas (especially beans and raw vegetables like spinach and salads boiled eggs)  and citrucel 1 heaping tsp twice daily with a large glass of water.  Pain should improve w/in 2 weeks and if not then consider further GI work up.   Wear 02 4lpm 24/7 and stop all smoking  Prednisone 20 mg one daily x 3 days, half daily x 3 days and stop   07/31/2013 f/u ov/Tilda Samudio re: last pred one day  prior to [place]  Patient presents with  . Follow-up    Pt reports her breathing is starting to improve. Still has minimal non prod cough. No new co's today.  did not start citrucel as rec and main c/o is abd tightness s change in bowel habits rec Wear 02 4lpm 24/7 and stop all smoking      Admit date: 08/08/2013  Discharge date: 08/15/2013  Discharge Diagnoses:  Acute on Chronic Hypercarbic Respiratory Failure  Sarcoid  OSA/OHS  Emergent Tracheostomy  Tobacco Abuse  Long standing non adherence to medical advice , see clinic records  Pulmonary Hypertension  Acute on Chronic dCHF  Hypotension  Acute on Chronic Renal Failure  Dysphagia  Hyperkalemia  Anemia  DM  Acute Encephalopathy  DISCHARGE PLAN BY DIAGNOSIS  Acute on Chronic Hypercarbic Respiratory Failure  Sarcoid  OSA/OHS  Emergent Tracheostomy  Tobacco Abuse  Long standing non adherence to medical advice , see clinic records  Pulmonary Hypertension  Discharge Plan:  -daytime ATC 40% as tolerated, PSV QHS  -prednisone taper - with wean to off over 6 weeks  -smoking cessaton  -Unfortunately expect some vocal cord/ thyroid cartilage damage here per ENT -long term effects on speech unclear, expect will need trach for a long time  -Speech following for PMV (not tol well)  -In regard to [facility], she has scarring on her CXR. Further, before the trach she had OSA. All of these are good secondary causes of pulmonary hypertension and it's really not clear from the literature when patients with PH from  sarcoid benefit from pulmonary vasodilators. Considering what she has been through recently I would prefer to treat the bronchospasm vs "sarcoid flare" (less likely) with a long course of steroids. If not returned to baseline functional status after steroids would set her up for a RHC.  -WOULD NOT DECANNULATE or DOWNSIZE at this time.     Acute on Chronic dCHF  Hypotension  Discharge Plan:  -continue coreg   Acute on Chronic Renal Failure  Discharge Plan:  -trend BMP  Dysphagia  Discharge Plan:  -cleared by SLP for thin liquid, regular diet with chin tuck  -initiate heart healthy, carb modified diet  -PPI  -stop TF 2022/09/02 as pt has passed MBS with SLP, if PO intake is not sufficient, may need to consider supplementation with TF  Hyperkalemia  Discharge Plan:  -resolved, no further interventions  Anemia  Discharge Plan:  -minimize lab draws as able  -transfuse if Hgb <7%, active bleeding or MI <8%  DM  Discharge Plan:  -continue SSI + lantus  -hold home medications: Metformin, 70/30  -consider restart of home meds closer to d/c  Acute Encephalopathy  Discharge Plan:  -resolved, no further needs  -PT efforts  DISCHARGE SUMMARY  Melody Gray is a 43 y.o. y/o female, active smoker with a PMH of sarcoidosis (dx 2001 -followed by dr Melvyn Novas) on home O2 (since 2011), NICM, pulm HTN, OSA, CKD admitted 4/10 by IMTS with worsening SOB thought related to volume overload. Recently, her baseline prednisone had been stopped in March per Dr. Melvyn Novas. She was diuresed with some improvement and was ready for tx to floor. During floor admission, she was noted to have periods of apnea on bipap at night. However, on 4/12 she declined with worsening renal function, hyperkalemia, anemia requiring transfusion, hypercarbia and placed on bipap with transfer to ICU. PCCM consulted for evaluation. She unfortunately did not tolerated BiPAP and had worsening of respiratory status in the setting of acute renal failure.  ABG at that time: 7.1 / 95 / 74 / 34. Despite efforts of bipap, she required emergent intubation in the setting of hypercarbia & worsening mental status. PATIENT WAS A VERY DIFFICULT AIRWAY (she has extremely small mouth, large tongue, and very anterior larynx) - requiring efforts of CCS, Anesthesia, & CCM to obtain a temporary airway. She was then taken to the OR on 4/12 PM for revision / placement of tracheostomy. Post tracheostomy placement, she was returned to ICU. Patient was able to transition to ATC during day time and nocturnal vent support at night. Thus far, she has not been successful with PSV at night. Has required PRVC.  In the setting of acute renal failure  reltated to ARB + diuresis, her home medications were held and have not been restarted. Offending agents were held, patient was gently hydrated and sr cr has returned to normal.  She was evaluated by SLP for swallowing needs. Patient passed MBS on 4/17 and was cleared for regular consistency diet, thin liquids with chin tuck. Please see discharge plans as above for details.  For Sarcoid, would plan to treat with prednisone tapered from 40mg  down to off over 6 weeks.  Regarding tracheostomy, it is helping her OSA and given her difficult airway would not consider decannulation for minimum 3-4 weeks. May want to consider lifelong trach to assist with OSA.     6/10/2015post hosp  f/u ov/ transition of care/Melodee Lupe re: sarcoid / pred at 10 mg / ? Not smoking ? / now trached Chief Complaint  Patient presents with  . Follow-up    Pt states her breathing has improved. No new co's today.   did not understand purpose of PMV leaves it in 24/7 and not using T collar at hs  Edema has resolved  rec Leave the purple plug out while sleeping with humidifed 02 collar in place Prednisone 10 mg daily     11/19/2013 f/u ov/Lorie Cleckley re: chronic resp failure/ stopped smoking at trach/ confused with trach instrucitons Chief Complaint  Patient presents with   . Follow-up    Pt reports had to go to ED 11/17/13 due to low o2 sats. She c/o slight increase in SOB for the past wk.     2lpm NP with PMV 2lpm with trach collar but feels it makes her noct cough worse, min mucoid trach secretions No ent f/u since trach placed.   Her sob is s  obvious daytime variabilty or assoc    pleurritic/ex cp or  subjective wheeze overt sinus or hb symptoms. No unusual exp hx or h/o childhood pna/ asthma or premature birth to her knowledge.    Sleeping ok on 02 /PMV without nocturnal  or early am exacerbation  of respiratory  c/o's or need for noct saba. Also denies any obvious fluctuation of symptoms with weather or environmental changes or other aggravating or alleviating factors except as outlined above   ROS  The following are not active complaints unless bolded sore throat, dysphagia, dental problems, itching, sneezing,  nasal congestion or excess/ purulent secretions, ear ache,   fever, chills, sweats, unintended wt loss, pleuritic or exertional cp, hemoptysis,  orthopnea pnd or leg swelling, presyncope, palpitations, heartburn, abdominal pain, anorexia, nausea, vomiting, diarrhea  or change in bowel or urinary habits, change in stools or urine, dysuria,hematuria,  rash, arthralgias, visual complaints, headache, numbness weakness or ataxia or problems with walking or coordination,  change in mood/affect or memory.         Past Medical History:  Sarcoidosis (Dr Callaway Hardigree)-w/ liver involvement per biopsy 03/2008 - Reversible airway component so start [facility] 01/2010 > better  - HFa 75% p coaching May 04, 2010  -  Off chronic prednisone 05/2011> restarted 07/2013  Unexplained Hypoxemia July 2011  - CT angiogram 01/07/10 >>> no PE  - PFT's February 09, 2010 FEV1 1.20 (49%) with ratio 72 and FRC 90%, FEV1  16% better p B2, DLC0 33% > corrects to 84   Morbid obesity  - Target wt = 153 for BMI < 30  - Trach 08/10/13 with very difficult airway([place]) QT prolongation   Diabetes mellitus, type II  Hypertension  Heart catheterization 06-06-06 : No CAD, no RAS, normal EF  Hx eclampsia  Hx of scalp seborrheic dermatitis            Objective:   Physical Exam  She is a minimally  cushingoid-appearing amb bf with trach in place no stridor and good phonation with PMV wt 238 January 06, 2010 >   217 May 04, 2010 >  02/12/2012  213 >  215 04/04/2012  > 239 04/08/2013 > 07/24/2013 249 > 07/31/2013  252 >   10/08/2013  233> 11/19/2013  242    HEENT: normocephalic; PEERLA; no nasal or pharyngeal abnormalities  neck: supple / trach site looks good / PMV in place nodes: no cervical lymphadenopathy  chest: clear to ausculatation and percussion though BS distant  heart: no murmurs, gallops, or rubs  - trace pitting both ankles/ feet  abd: soft, nontender; BS normoactive; no abdominal masses, tenderness, organomegaly; abdomen is obese      cxr 11/17/13 Stable cardiomegaly, adenopathy and chronic interstitial lung  disease. No acute cardiopulmonary process.       Assessment:

## 2013-11-19 NOTE — Assessment & Plan Note (Signed)
-   repeat echo 10/14/10 LV much worse than RV so rx as L Ht failure by cards/Crenshaw - CTa 07/25/13 ? Very small pe vs artifact on L > venous dopplers 07/28/2013 neg bilaterally  - Echo 08/10/13 The RV was dialted and severely hyponetic and there was a D-shaped septum suggestive of PAH with RV pressure volume Overload.  Key is to provide adequate 02 24/7 - see chronic resp failure

## 2013-11-19 NOTE — Assessment & Plan Note (Signed)
-    Complicated by PAH - Walked 3 laps @ 185 ft each stopped due to  End of test, no desat on 4lpm 10/05/2010  - 02/02/2011  Walked RA  2 laps @ 185 ft each stopped due to  desat to 88 so change rx to 2lpm with activity outside the house and at hs - 08/21/2011  Walked RA x 1 laps @ 185 ft each stopped due to desat 85% - 11/21/2011  Walked RA  2 laps @ 185 ft each stopped due to  Sat 88 -  ONO RA < 89% x 6h 26 min 03/19/12 > repeat on 2lpm 04/05/12 > desats resolved - 04/08/2013   Walked RA x one lap @ 185 stopped due to  sats 85%  - HCO3 = 40 07/31/13 c/w hypercarbia - 08/10/13 Trach emergently by Janace Hoard with very difficult airway  I had an extended discussion with the patient today lasting 15 to 20 minutes of a 25 minute visit on the following issues:   See instructions for specific recommendations which were reviewed directly with the patient who was given a copy with highlighter outlining the key components.   Needs ent f/u

## 2013-11-19 NOTE — Assessment & Plan Note (Signed)
-   PFT's 11/21/2011 FEV1 1.23 (50%) ratio 67 and no better p B2,  DLCO 31 corrects to 73   - trial off chronic prednisone 02/12/2012 > resumed 07/2013   The goal with a chronic steroid dependent illness is always arriving at the lowest effective dose that controls the disease/symptoms and not accepting a set "formula" which is based on statistics or guidelines that don't always take into account patient  variability or the natural hx of the dz in every individual patient, which may well vary over time.  For now therefore I recommend the patient maintain  10 mg per day until correct her noct cough then taper  Needs med reconciliation next ;  To keep things simple, I have asked the patient to first separate medicines that are perceived as maintenance, that is to be taken daily "no matter what", from those medicines that are taken on only on an as-needed basis and I have given the patient examples of both, and then return to see our NP to generate a  detailed  medication calendar which should be followed until the next physician sees the patient and updates it.

## 2013-11-19 NOTE — Assessment & Plan Note (Signed)
Clearly upper airway related ? Related to trach position  Referred back to Dr Janace Hoard rec add [id number] trial until next ov

## 2013-11-21 ENCOUNTER — Encounter (HOSPITAL_COMMUNITY): Payer: Self-pay | Admitting: Emergency Medicine

## 2013-11-21 ENCOUNTER — Emergency Department (HOSPITAL_COMMUNITY): Payer: Medicare Other

## 2013-11-21 ENCOUNTER — Inpatient Hospital Stay (HOSPITAL_COMMUNITY)
Admission: EM | Admit: 2013-11-21 | Discharge: 2013-11-26 | DRG: 208 | Disposition: A | Discharge status: Home Health | Payer: Medicare Other | Attending: Pulmonary Disease | Admitting: Pulmonary Disease

## 2013-11-21 DIAGNOSIS — E11319 Type 2 diabetes mellitus with unspecified diabetic retinopathy without macular edema: Secondary | ICD-10-CM | POA: Diagnosis present

## 2013-11-21 DIAGNOSIS — I2729 Other secondary pulmonary hypertension: Secondary | ICD-10-CM

## 2013-11-21 DIAGNOSIS — R05 Cough: Secondary | ICD-10-CM

## 2013-11-21 DIAGNOSIS — J811 Chronic pulmonary edema: Secondary | ICD-10-CM | POA: Diagnosis not present

## 2013-11-21 DIAGNOSIS — Z7982 Long term (current) use of aspirin: Secondary | ICD-10-CM | POA: Diagnosis not present

## 2013-11-21 DIAGNOSIS — I429 Cardiomyopathy, unspecified: Secondary | ICD-10-CM

## 2013-11-21 DIAGNOSIS — I10 Essential (primary) hypertension: Secondary | ICD-10-CM | POA: Diagnosis present

## 2013-11-21 DIAGNOSIS — Z9981 Dependence on supplemental oxygen: Secondary | ICD-10-CM | POA: Diagnosis not present

## 2013-11-21 DIAGNOSIS — J15211 Pneumonia due to Methicillin susceptible Staphylococcus aureus: Secondary | ICD-10-CM | POA: Diagnosis not present

## 2013-11-21 DIAGNOSIS — J690 Pneumonitis due to inhalation of food and vomit: Secondary | ICD-10-CM

## 2013-11-21 DIAGNOSIS — J9620 Acute and chronic respiratory failure, unspecified whether with hypoxia or hypercapnia: Secondary | ICD-10-CM | POA: Diagnosis not present

## 2013-11-21 DIAGNOSIS — D869 Sarcoidosis, unspecified: Secondary | ICD-10-CM | POA: Diagnosis present

## 2013-11-21 DIAGNOSIS — IMO0002 Reserved for concepts with insufficient information to code with codable children: Secondary | ICD-10-CM

## 2013-11-21 DIAGNOSIS — E662 Morbid (severe) obesity with alveolar hypoventilation: Secondary | ICD-10-CM | POA: Diagnosis present

## 2013-11-21 DIAGNOSIS — Z6841 Body Mass Index (BMI) 40.0 and over, adult: Secondary | ICD-10-CM | POA: Diagnosis not present

## 2013-11-21 DIAGNOSIS — I509 Heart failure, unspecified: Secondary | ICD-10-CM

## 2013-11-21 DIAGNOSIS — I2510 Atherosclerotic heart disease of native coronary artery without angina pectoris: Secondary | ICD-10-CM | POA: Diagnosis present

## 2013-11-21 DIAGNOSIS — E1139 Type 2 diabetes mellitus with other diabetic ophthalmic complication: Secondary | ICD-10-CM | POA: Diagnosis present

## 2013-11-21 DIAGNOSIS — Z794 Long term (current) use of insulin: Secondary | ICD-10-CM

## 2013-11-21 DIAGNOSIS — J9600 Acute respiratory failure, unspecified whether with hypoxia or hypercapnia: Secondary | ICD-10-CM | POA: Diagnosis not present

## 2013-11-21 DIAGNOSIS — E872 Acidosis, unspecified: Secondary | ICD-10-CM | POA: Diagnosis present

## 2013-11-21 DIAGNOSIS — I428 Other cardiomyopathies: Secondary | ICD-10-CM | POA: Diagnosis not present

## 2013-11-21 DIAGNOSIS — Z87891 Personal history of nicotine dependence: Secondary | ICD-10-CM

## 2013-11-21 DIAGNOSIS — J9819 Other pulmonary collapse: Secondary | ICD-10-CM | POA: Diagnosis not present

## 2013-11-21 DIAGNOSIS — R059 Cough, unspecified: Secondary | ICD-10-CM

## 2013-11-21 DIAGNOSIS — N039 Chronic nephritic syndrome with unspecified morphologic changes: Secondary | ICD-10-CM

## 2013-11-21 DIAGNOSIS — G4733 Obstructive sleep apnea (adult) (pediatric): Secondary | ICD-10-CM | POA: Diagnosis present

## 2013-11-21 DIAGNOSIS — R0602 Shortness of breath: Secondary | ICD-10-CM | POA: Diagnosis not present

## 2013-11-21 DIAGNOSIS — K219 Gastro-esophageal reflux disease without esophagitis: Secondary | ICD-10-CM | POA: Diagnosis present

## 2013-11-21 DIAGNOSIS — J189 Pneumonia, unspecified organism: Secondary | ICD-10-CM

## 2013-11-21 DIAGNOSIS — E119 Type 2 diabetes mellitus without complications: Secondary | ICD-10-CM

## 2013-11-21 DIAGNOSIS — R0902 Hypoxemia: Secondary | ICD-10-CM | POA: Diagnosis not present

## 2013-11-21 DIAGNOSIS — Z930 Tracheostomy status: Secondary | ICD-10-CM | POA: Diagnosis not present

## 2013-11-21 DIAGNOSIS — D631 Anemia in chronic kidney disease: Secondary | ICD-10-CM

## 2013-11-21 DIAGNOSIS — J9621 Acute and chronic respiratory failure with hypoxia: Secondary | ICD-10-CM

## 2013-11-21 LAB — I-STAT ARTERIAL BLOOD GAS, ED
Acid-Base Excess: 4 mmol/L — ABNORMAL HIGH (ref 0.0–2.0)
Bicarbonate: 33.7 mEq/L — ABNORMAL HIGH (ref 20.0–24.0)
O2 Saturation: 80 %
Patient temperature: 100.9
TCO2: 36 mmol/L (ref 0–100)
pCO2 arterial: 77.3 mmHg (ref 35.0–45.0)
pH, Arterial: 7.254 — ABNORMAL LOW (ref 7.350–7.450)
pO2, Arterial: 58 mmHg — ABNORMAL LOW (ref 80.0–100.0)

## 2013-11-21 LAB — CBC WITH DIFFERENTIAL/PLATELET
Basophils Absolute: 0 10*3/uL (ref 0.0–0.1)
Basophils Relative: 0 % (ref 0–1)
Eosinophils Absolute: 0.2 10*3/uL (ref 0.0–0.7)
Eosinophils Relative: 1 % (ref 0–5)
HCT: 45.3 % (ref 36.0–46.0)
Hemoglobin: 13.6 g/dL (ref 12.0–15.0)
Lymphocytes Relative: 29 % (ref 12–46)
Lymphs Abs: 4.8 10*3/uL — ABNORMAL HIGH (ref 0.7–4.0)
MCH: 28.4 pg (ref 26.0–34.0)
MCHC: 30 g/dL (ref 30.0–36.0)
MCV: 94.6 fL (ref 78.0–100.0)
Monocytes Absolute: 1 10*3/uL (ref 0.1–1.0)
Monocytes Relative: 6 % (ref 3–12)
Neutro Abs: 10.5 10*3/uL — ABNORMAL HIGH (ref 1.7–7.7)
Neutrophils Relative %: 64 % (ref 43–77)
Platelets: 279 10*3/uL (ref 150–400)
RBC: 4.79 MIL/uL (ref 3.87–5.11)
RDW: 16.1 % — ABNORMAL HIGH (ref 11.5–15.5)
WBC: 16.6 10*3/uL — ABNORMAL HIGH (ref 4.0–10.5)

## 2013-11-21 LAB — COMPREHENSIVE METABOLIC PANEL
ALT: 12 U/L (ref 0–35)
AST: 31 U/L (ref 0–37)
Albumin: 3.2 g/dL — ABNORMAL LOW (ref 3.5–5.2)
Alkaline Phosphatase: 98 U/L (ref 39–117)
Anion gap: 11 (ref 5–15)
BUN: 16 mg/dL (ref 6–23)
CO2: 29 mEq/L (ref 19–32)
Calcium: 7.9 mg/dL — ABNORMAL LOW (ref 8.4–10.5)
Chloride: 100 mEq/L (ref 96–112)
Creatinine, Ser: 0.83 mg/dL (ref 0.50–1.10)
GFR calc Af Amer: >90 mL/min (ref >90)
GFR calc non Af Amer: 85 mL/min — ABNORMAL LOW (ref >90)
Glucose, Bld: 200 mg/dL — ABNORMAL HIGH (ref 70–99)
Potassium: 5 mEq/L (ref 3.7–5.3)
Sodium: 140 mEq/L (ref 137–147)
Total Bilirubin: 0.4 mg/dL (ref 0.3–1.2)
Total Protein: 7.8 g/dL (ref 6.0–8.3)

## 2013-11-21 LAB — I-STAT CG4 LACTIC ACID, ED: Lactic Acid, Venous: 1.18 mmol/L (ref 0.5–2.2)

## 2013-11-21 LAB — PRO B NATRIURETIC PEPTIDE: Pro B Natriuretic peptide (BNP): 4425 pg/mL — ABNORMAL HIGH (ref 0–125)

## 2013-11-21 MED ORDER — ALBUTEROL SULFATE (2.5 MG/3ML) 0.083% IN NEBU
2.5000 mg | INHALATION_SOLUTION | Freq: Once | RESPIRATORY_TRACT | Status: AC
  Administered 2013-11-21: 2.5 mg via RESPIRATORY_TRACT
  Filled 2013-11-21: qty 3

## 2013-11-21 MED ORDER — PIPERACILLIN-TAZOBACTAM 3.375 G IVPB 30 MIN
3.3750 g | Freq: Once | INTRAVENOUS | Status: AC
  Administered 2013-11-21: 3.375 g via INTRAVENOUS
  Filled 2013-11-21: qty 50

## 2013-11-21 MED ORDER — VANCOMYCIN HCL IN DEXTROSE 1-5 GM/200ML-% IV SOLN
1000.0000 mg | Freq: Two times a day (BID) | INTRAVENOUS | Status: DC
  Administered 2013-11-22 (×2): 1000 mg via INTRAVENOUS
  Filled 2013-11-21 (×3): qty 200

## 2013-11-21 MED ORDER — VANCOMYCIN HCL 10 G IV SOLR
2000.0000 mg | Freq: Once | INTRAVENOUS | Status: AC
  Administered 2013-11-22: 2000 mg via INTRAVENOUS
  Filled 2013-11-21: qty 2000

## 2013-11-21 NOTE — ED Notes (Signed)
Dr. Silvio Clayman at [facility]

## 2013-11-21 NOTE — ED Notes (Signed)
No changes. Pt calm & interactive, BP cuff returned to arm, PCCM at [facility], RT at [facility] family at [facility].

## 2013-11-21 NOTE — Progress Notes (Addendum)
ANTIBIOTIC CONSULT NOTE - INITIAL  Pharmacy Consult for Vancomycin  Indication: rule out pneumonia  Allergies  Allergen Reactions  . Vicodin [Hydrocodone-Acetaminophen] Itching    Patient Measurements: ~110 kg  Vital Signs: Temp: 100.9 F (38.3 C) (07/24 2214) Temp src: Oral (07/24 2214) BP: 146/80 mmHg (07/24 2220) Pulse Rate: 126 (07/24 2230)  Labs:  Recent Labs  11/21/13 2205  WBC 16.6*  HGB 13.6  PLT 279  CREATININE 0.83      Medical History: Past Medical History  Diagnosis Date  . Sarcoidosis     Followed by Dr. Melvyn Novas; w/ liver involvement per biopsy 12/09, Reversible airway component so started on [facility] 01/2010; HFA 75% p coaching 05/2010  . Hypoxemia     CT angiogram 9/11>> No PE; PFTs 10/11- FEV1 1.20 (49%) with 16% better p B2, DLCO 33%> corrects to 84; O2 sats ok on 4 lpm X rapid walk X 3 laps 05/2010  . Morbid obesity     Target wt= 153 for BMI <30  . QT prolongation   . Diabetes mellitus   . Hypertension   . Hx of cardiac cath 2/08    No CAD, no RAS,  normal EF  . Seborrheic dermatitis of scalp   . 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 Hep B serology neg. but Hep C ab +, HIV neg. 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 Cardiomyopathy, nonischemic     EF 45% 12/10; Echo 7/11 normal EF, PAS 48  . Diabetic retinopathy     Right eye 2/11  . Health maintenance examination     Mammogram 05/2010 Negative; Last Pap smear 03/2008; Last DM eye exam 2/11> mild non-proliferative diabetic retinopathy. OD  . Helicobacter pylori ab+ 05/2011    Pt was symptomatic and treatment planned for 05/2011  . CHF (congestive heart failure)    Assessment: 43 y/o F with multiple medical problems here with shortness of breath. CXR witih pulmonary edema/effusion, starting antibiotics to r/o infection. Leukocytosis present, Tmax 100.9, on chronic prednisone at home.   Goal of Therapy:  Vancomycin  trough level 15-20 mcg/ml  Plan:  -Vancomycin 2000 mg IV x 1, then 1000 mg IV q12h -Trend WBC, temp, renal function, cultures -Drug levels as indicated   Narda Bonds 11/21/2013,11:09 PM  ============================== Adding Zosyn -Zosyn 3.375G IV q8h to be infused over 4 hours Sharlynn Oliphant, PharmD

## 2013-11-21 NOTE — ED Notes (Addendum)
Pt arrives by [facility] for hypoxia at home, SPO2 57% at home on RA (trache). Recent ICU admission on vent. Sx onset yesterday with congestion. Took mucinex yesterday and woke today with increased tightness & sob. Denies CP. On daily prednisone. Rhonchi throughout. Albuterol 5mg  given PTA. NSL 20g, L AC. h/o COPD and DM. Here from home with family. SPO2 up to 80s on mask to trache. RT present on arrival.

## 2013-11-21 NOTE — H&P (Signed)
PULMONARY / CRITICAL CARE MEDICINE   Name: Melody Gray MRN: [id number] DOB: October 31, 1970    ADMISSION DATE:  11/21/2013 CONSULTATION DATE:  11/21/2013  REFERRING MD :  EDP  CHIEF COMPLAINT:  SOB  INITIAL PRESENTATION: Pt has a history of sarcoidosis dx in 2001 and O2 dependent since 2011. Also with h/o PAH and OHS requiring trach earlier this year. Presenting to ED with hypoxia and hypercarbic respiratory failure. Trach changed to cuffed in ED for placement on vent, significant improvement on this.  STUDIES:  7/24: Bedside ultrasound:  Small R pleural effusion.  Consolidation of RLL  SIGNIFICANT EVENTS: 7/24 - admitted to ICU with hypoxic and hypercarbic respiratory failure  CULTURES: Blood x2 7/24>>> Trach aspirate 7/24>>>  ANTIBIOTICS: Vanc 7/24>>> Zosyn 7/24>>>  LINES: Trach (chronic)>>  HISTORY OF PRESENT ILLNESS: Pt reports several days of congestion and hypoxia without fevers or SOB. On 7/20 her [facility] RN told her to go to the ED because of O2 sat 80% which resolved with some trouble shooting of home O2 and pt was discharged. Today her nurse again instructed her to come in for hypoxia. When she arrived she was in respiratory distress which improved significantly with change of trach to cuff and placement on PRVC vent. Patient reports that she has also been coughing up blood tinged sputum which started yesterday.  She weighs herself regularly and this has been stable.  PAST MEDICAL HISTORY :  Past Medical History  Diagnosis Date  . Sarcoidosis     Followed by Dr. Melvyn Novas; w/ liver involvement per biopsy 12/09, Reversible airway component so started on  [facility] 01/2010; HFA 75% p coaching 05/2010  . Hypoxemia     CT angiogram 9/11>> No PE; PFTs 10/11- FEV1 1.20 (49%) with 16% better p B2, DLCO 33%> corrects to 84; O2 sats ok on 4 lpm X rapid walk X 3 laps 05/2010  . Morbid obesity     Target wt= 153 for BMI <30  . QT prolongation   . Diabetes mellitus   . Hypertension   . Hx of  cardiac cath 2/08    No CAD, no RAS,  normal EF  . Seborrheic dermatitis of scalp   . 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 Hep B serology neg. but Hep C ab +, HIV neg. 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 Cardiomyopathy, nonischemic     EF 45% 12/10; Echo 7/11 normal EF, PAS 48  . Diabetic retinopathy     Right eye 2/11  . Health maintenance examination     Mammogram 05/2010 Negative; Last Pap smear 03/2008; Last DM eye exam 2/11> mild non-proliferative diabetic retinopathy. OD  . Helicobacter pylori ab+ 05/2011    Pt was symptomatic and treatment planned for 05/2011  . CHF (congestive heart failure)    Past Surgical History  Procedure Laterality Date  . Tubal ligation    . Breast surgery    . Cesarean section    . Tracheostomy tube placement N/A 08/10/2013    Procedure: TRACHEOSTOMY;  Surgeon: Melissa Montane, MD;  Location: [place];  Service: ENT;  Laterality: N/A;   Prior to Admission medications   Medication Sig Start Date End Date Taking? Authorizing Provider  ACCU-CHEK FASTCLIX LANCETS MISC Check blood sugar 3 times a day as instructed 10/01/13   Jerene Pitch, MD  amLODipine (NORVASC) 5 MG tablet Take 1 tablet (5 mg total)  by mouth daily.    Jerene Pitch, MD  atorvastatin (LIPITOR) 20 MG tablet Take 1 tablet (20 mg total) by mouth daily at 6 PM. 11/04/13   Clinton Gallant, MD  carvedilol (COREG) 3.125 MG tablet Take 1 tablet (3.125 mg total) by mouth 2 (two) times daily with a meal. 11/04/13   Clinton Gallant, MD  Dextromethorphan-Guaifenesin [facility] DM MAXIMUM STRENGTH) 60-1200 MG TB12 One every 12 hours as needed for cough 11/19/13   Tanda Rockers, MD  famotidine (PEPCID) 20 MG tablet One at bedtime 11/19/13   Tanda Rockers, MD  ferrous sulfate 325 (65 FE) MG tablet Take 1 tablet (325 mg total) by mouth 3 (three) times daily with meals.    Jerene Pitch, MD  furosemide (LASIX) 40 MG tablet Take 1 tablet (40 mg  total) by mouth daily. 09/09/13 09/09/14  Jerene Pitch, MD  glucose blood (ACCU-CHEK SMARTVIEW) test strip Check blood sugar 3 times a day as instructed 10/01/13   Jerene Pitch, MD  insulin aspart (NOVOLOG) 100 UNIT/ML injection Inject 2-8 Units into the skin 3 (three) times daily before meals. Sliding scale    Historical Provider, MD  insulin detemir (LEVEMIR) 100 UNIT/ML injection Inject 20-26 Units into the skin at bedtime. 26 units every morning and 20 units every night    Historical Provider, MD  irbesartan (AVAPRO) 150 MG tablet Take 150 mg by mouth daily. 09/16/13 09/16/14  Othella Boyer, MD  Melatonin 3 MG TABS Take 3 mg by mouth at bedtime.    Historical Provider, MD  metFORMIN (GLUCOPHAGE) 1000 MG tablet Take 1,000 mg by mouth 2 (two) times daily with a meal.    Historical Provider, MD  omeprazole (PRILOSEC) 20 MG capsule Take 20 mg by mouth daily.    Historical Provider, MD  predniSONE (DELTASONE) 10 MG tablet take 1 tablet by mouth once daily WITH BREAKFAST 11/11/13   Tanda Rockers, MD   Allergies  Allergen Reactions  . Vicodin [Hydrocodone-Acetaminophen] Itching    FAMILY HISTORY:  Family History  Problem Relation Age of Onset  . Cancer Mother     colon cancer  . Multiple sclerosis Father   . Asthma Sister     in childhood  . Diabetes Father   . Hypertension     SOCIAL HISTORY:  reports that she has quit smoking. She has never used smokeless tobacco. She reports that she does not drink alcohol or use illicit drugs.  REVIEW OF SYSTEMS:  Denies fever, n/v, sob. Endorses cough(chronic) and hemoptysis.  SUBJECTIVE:   VITAL SIGNS: Temp:  [100.9 F (38.3 C)] 100.9 F (38.3 C) (07/24 2214) Pulse Rate:  [124-128] 124 (07/24 2310) Resp:  [18-30] 18 (07/24 2310) BP: (146-165)/(62-94) 148/94 mmHg (07/24 2310) SpO2:  [57 %-100 %] 99 % (07/24 2310) FiO2 (%):  [98 %] 98 % (07/24 2220) HEMODYNAMICS:   VENTILATOR SETTINGS: Vent Mode:  [-]  FiO2 (%):  [98 %] 98 % INTAKE /  OUTPUT: No intake or output data in the 24 hours ending 11/21/13 2312  PHYSICAL EXAMINATION: General:  Obese woman, trach with vent, NAD Neuro:  Alert and oriented, no focal deficits HEENT:  NCAT, MMM, EOMI Cardiovascular:  RRR, no murmur Lungs: rhonchorous R>L, good air movement throughout Abdomen:  Soft, NTND Musculoskeletal: 2+ pitting edema BLE Skin:  intact  LABS:  CBC  Recent Labs Lab 11/21/13 2205  WBC 16.6*  HGB 13.6  HCT 45.3  PLT 279   Coag's No results found for this basename:  APTT, INR,  in the last 168 hours BMET  Recent Labs Lab 11/21/13 2205  NA 140  K 5.0  CL 100  CO2 29  BUN 16  CREATININE 0.83  GLUCOSE 200*   Electrolytes  Recent Labs Lab 11/21/13 2205  CALCIUM 7.9*   Sepsis Markers  Recent Labs Lab 11/21/13 2215  LATICACIDVEN 1.18   ABG  Recent Labs Lab 11/21/13 2238  PHART 7.254*  PCO2ART 77.3*  PO2ART 58.0*   Liver Enzymes  Recent Labs Lab 11/21/13 2205  AST 31  ALT 12  ALKPHOS 98  BILITOT 0.4  ALBUMIN 3.2*   Cardiac Enzymes  Recent Labs Lab 11/21/13 2205  PROBNP 4425.0*   Glucose No results found for this basename: GLUCAP,  in the last 168 hours  Imaging CXR with diffuse airspace opacities in bilateral lower lobes R>L as well as RML  ASSESSMENT / PLAN:  PULMONARY A: Acute hypercarbic respiratory failure, hypoxia Sarcoidosis - on chronic steroids Trach dependent OHS Likely HCAP P:   Vent, PRVC, TV at 37ml/CC VAP bundle See ID   CARDIOVASCULAR A: HFpEF PAH HTN CAD Severe sepsis (fever, elevated WBC, resp failure) Unclear volume status (peripheral edema but no weight change), likely close to baseline P:  Stress steroids given chronic prednisone Hold home antihypertensives (amlodipine, irbesartan)  Cont home BB (coreg) Hold lasix  RENAL A:  No acute issues Risk for AKI P:   Monitor BMET   GASTROINTESTINAL A:  GERD P:   SUP: PPI NPO  HEMATOLOGIC A:  DVT risk Do not suspect  DAH as Hg above baseline and sputum is merely blood tinged No acute issues P:  SQ heparin  INFECTIOUS A:  Likely HCAP P:   Broad spectrum coverage, vanc/zosyn Follow blood cultures Add sputum culture  ENDOCRINE A:  DM   P:   Hold home metformin, levemir CBG/SSI q4  NEUROLOGIC A: No acute issues P:   Monitor   Beverlyn Roux, MD, MPH [facility] PGY-2 11/22/2013 12:13 AM  STAFF NOTE: I have personally seen and evaluated the patient and reviewed the available data. I have discussed the patient with the housestaff officer or NP. I agree with the resident's note as documented above.  Briefly: 62yof with PMH of sarcoid on chronic oral steroids, chronic trach 2/2 OHS presents with hypoxic resp failure likely 2/2 HCAP.  Trach changed in ED to cuffed trach.  Pt now on vent with improvement in numbers.  Getting broad spectrum ABX, lung protective ventilation, stress dose steroids.   The patient is critically ill with multiple organ systems failure and requires high complexity decision making for assessment and support, frequent evaluation and titration of therapies, application of advanced monitoring technologies and extensive interpretation of multiple databases. Critical Care Time devoted to patient care services described in this note is 60 minutes.    Lucrezia Starch, MD  Pulmonary and [place]  Pager: [phone]  11/21/2013, 11:30pm

## 2013-11-21 NOTE — ED Provider Notes (Signed)
CSN: [id number]     Arrival date & time 11/21/13  2157 History   First MD Initiated Contact with Patient 11/21/13 2212     Chief Complaint  Patient presents with  . Shortness of Breath     (Consider location/radiation/quality/duration/timing/severity/associated sxs/prior Treatment) Patient is a 43 y.o. female presenting with shortness of breath.  Shortness of Breath Severity:  Severe Onset quality:  Sudden Duration:  2 days Timing:  Constant Progression:  Worsening Chronicity:  Recurrent Relieved by:  Nothing Risk factors: obesity   Risk factors comment:  Hx of sarcoidosis   Past Medical History  Diagnosis Date  . Sarcoidosis     Followed by Dr. Melvyn Novas; w/ liver involvement per biopsy 12/09, Reversible airway component so started on [facility] 01/2010; HFA 75% p coaching 05/2010  . Hypoxemia     CT angiogram 9/11>> No PE; PFTs 10/11- FEV1 1.20 (49%) with 16% better p B2, DLCO 33%> corrects to 84; O2 sats ok on 4 lpm X rapid walk X 3 laps 05/2010  . Morbid obesity     Target wt= 153 for BMI <30  . QT prolongation   . Diabetes mellitus   . Hypertension   . Hx of cardiac cath 2/08    No CAD, no RAS,  normal EF  . Seborrheic dermatitis of scalp   . 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 Hep B serology neg. but Hep C ab +, HIV neg. 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 Cardiomyopathy, nonischemic     EF 45% 12/10; Echo 7/11 normal EF, PAS 48  . Diabetic retinopathy     Right eye 2/11  . Health maintenance examination     Mammogram 05/2010 Negative; Last Pap smear 03/2008; Last DM eye exam 2/11> mild non-proliferative diabetic retinopathy. OD  . Helicobacter pylori ab+ 05/2011    Pt was symptomatic and treatment planned for 05/2011  . CHF (congestive heart failure)    Past Surgical History  Procedure Laterality Date  . Tubal ligation    . Breast surgery    . Cesarean section    . Tracheostomy tube  placement N/A 08/10/2013    Procedure: TRACHEOSTOMY;  Surgeon: Melissa Montane, MD;  Location: [facility] OR;  Service: ENT;  Laterality: N/A;   Family History  Problem Relation Age of Onset  . Cancer Mother     colon cancer  . Multiple sclerosis Father   . Asthma Sister     in childhood  . Diabetes Father   . Hypertension     History  Substance Use Topics  . Smoking status: Former Smoker -- 0.30 packs/day for 20 years  . Smokeless tobacco: Never Used     Comment: Needs to cut back.  . Alcohol Use: No   OB History   Grav Para Term Preterm Abortions TAB SAB Ect Mult Living                 Review of Systems  Unable to perform ROS: Acuity of condition  Respiratory: Positive for shortness of breath.       Allergies  Vicodin  Home Medications   Prior to Admission medications   Medication Sig Start Date End Date Taking? Authorizing Provider  amLODipine (NORVASC) 5 MG tablet Take 1 tablet (5 mg total) by mouth daily.   Yes Jerene Pitch, MD  atorvastatin (LIPITOR) 20 MG tablet Take 1 tablet (20  mg total) by mouth daily at 6 PM. 11/04/13  Yes Clinton Gallant, MD  carvedilol (COREG) 3.125 MG tablet Take 1 tablet (3.125 mg total) by mouth 2 (two) times daily with a meal. 11/04/13  Yes Clinton Gallant, MD  Dextromethorphan-Guaifenesin [facility] DM MAXIMUM STRENGTH) 60-1200 MG TB12 One every 12 hours as needed for cough 11/19/13  Yes Tanda Rockers, MD  famotidine (PEPCID) 20 MG tablet One at bedtime 11/19/13  Yes Tanda Rockers, MD  ferrous sulfate 325 (65 FE) MG tablet Take 1 tablet (325 mg total) by mouth 3 (three) times daily with meals.   Yes Jerene Pitch, MD  furosemide (LASIX) 40 MG tablet Take 1 tablet (40 mg total) by mouth daily. 09/09/13 09/09/14 Yes Jerene Pitch, MD  insulin aspart (NOVOLOG) 100 UNIT/ML injection Inject 2-8 Units into the skin 3 (three) times daily. Sliding scale   Yes Historical Provider, MD  insulin detemir (LEVEMIR) 100 UNIT/ML injection Inject 20-26 Units into the skin at  bedtime. 26 units every morning and 20 units every night   Yes Historical Provider, MD  irbesartan (AVAPRO) 150 MG tablet Take 150 mg by mouth daily. 09/16/13 09/16/14 Yes Neema Bobbie Stack, MD  Melatonin 3 MG TABS Take 3 mg by mouth at bedtime.   Yes Historical Provider, MD  metFORMIN (GLUCOPHAGE) 1000 MG tablet Take 1,000 mg by mouth 2 (two) times daily with a meal.   Yes Historical Provider, MD  omeprazole (PRILOSEC) 20 MG capsule Take 20 mg by mouth daily.   Yes Historical Provider, MD  predniSONE (DELTASONE) 10 MG tablet take 1 tablet by mouth once daily WITH BREAKFAST 11/11/13  Yes Tanda Rockers, MD  valsartan (DIOVAN) 320 MG tablet Take 320 mg by mouth daily.   Yes Historical Provider, MD   BP 133/76  Pulse 108  Temp(Src) 100.9 F (38.3 C) (Oral)  Resp 17  SpO2 94%  LMP 10/14/2013 Physical Exam  Constitutional: She appears well-developed and well-nourished. No distress.  HENT:  Head: Normocephalic and atraumatic.  Eyes: Pupils are equal, round, and reactive to light. Right eye exhibits no discharge. Left eye exhibits no discharge.  Neck: Normal range of motion.  Cardiovascular: Normal rate, regular rhythm and normal heart sounds.   Pulmonary/Chest: Accessory muscle usage present. Tachypnea noted. She is in respiratory distress. She has decreased breath sounds. She has rhonchi.  Trach in place, with minimal secretions noted  Abdominal: Soft. She exhibits no distension. There is no tenderness.  Musculoskeletal: Normal range of motion.  Neurological: She is alert.  Skin: Skin is warm. She is not diaphoretic.    ED Course  Procedures (including critical care time) Labs Review Labs Reviewed  COMPREHENSIVE METABOLIC PANEL - Abnormal; Notable for the following:    Glucose, Bld 200 (*)    Calcium 7.9 (*)    Albumin 3.2 (*)    GFR calc non Af Amer 85 (*)    All other components within normal limits  CBC WITH DIFFERENTIAL - Abnormal; Notable for the following:    WBC 16.6 (*)    RDW  16.1 (*)    Neutro Abs 10.5 (*)    Lymphs Abs 4.8 (*)    All other components within normal limits  PRO B NATRIURETIC PEPTIDE - Abnormal; Notable for the following:    Pro B Natriuretic peptide (BNP) 4425.0 (*)    All other components within normal limits  I-STAT ARTERIAL BLOOD GAS, ED - Abnormal; Notable for the following:    pH, Arterial 7.254 (*)  pCO2 arterial 77.3 (*)    pO2, Arterial 58.0 (*)    Bicarbonate 33.7 (*)    Acid-Base Excess 4.0 (*)    All other components within normal limits  CULTURE, BLOOD (ROUTINE X 2)  CULTURE, BLOOD (ROUTINE X 2)  BLOOD GAS, ARTERIAL  CORTISOL  PROCALCITONIN  I-STAT CG4 LACTIC ACID, ED    Imaging Review Dg Chest Portable 1 View  11/21/2013   CLINICAL DATA:  sob/hypoxia/trache sob/hypoxia/trache  EXAM: PORTABLE CHEST - 1 VIEW  COMPARISON:  Prior radiograph from 11/17/2013  FINDINGS: Tracheostomy tube overlies the upper airway. Cardiomegaly is stable.  The lungs are mildly hypoinflated. There is diffuse pulmonary vascular congestion with indistinctness of the interstitial markings, consistent with diffuse pulmonary edema. A moderate right pleural effusion is likely present with smaller left pleural effusion. No definite focal infiltrate. No pneumothorax.  No acute osseus abnormality.  IMPRESSION: 1. Cardiomegaly with findings consistent with moderate to severe pulmonary edema. 2. Moderate right with probable smaller left pleural effusion.   Electronically Signed   By: Jeannine Boga M.D.   On: 11/21/2013 22:29     EKG Interpretation   Date/Time:  Friday November 21 2013 22:05:19 EDT Ventricular Rate:  141 PR Interval:  198 QRS Duration: 124 QT Interval:  343 QTC Calculation: 525 R Axis:   -148 Text Interpretation:  Sinus tachycardia Baseline wander in lead(s) I aVL  aVF V1 V2 V6 Confirmed by DELOS  MD, DOUGLAS ([id number]) on 11/21/2013 10:15:02  PM      MDM   Final diagnoses:  Acute on chronic respiratory failure with hypoxia    43 year old female with a history of chronic respiratory failure, trach dependent, with a history of sarcoidosis, CHF who presents today with worsening shortness of breath.  On arrival, the patient appears uncomfortable. Her initial O2 sats in triage were 57% on room air. Patient was brought back to the resuscitation room placed on a trach collar with sats improving to the high 80s. Patient is tachycardic and tachypneic. Chest x-ray performed demonstrates significant pleural effusions and pulmonary edema. This is likely a acute on chronic exacerbation of her CHF with possible complication with pneumonia given the patient's temperature of 100.9 upon arrival. Plan is to obtain lab results and blood cultures.   Consulted with the intensivist early in the patient's course. Patient has a significant respiratory acidosis, leukocytosis with a left shift, and an x-ray consistent with pulmonary edema. Patient's trach was changed to a cuffed trach by the respiratory therapist without any complications. Patient admitted to the ICU in guarded condition. Patient was seen and evaluated by myself and by the attending Dr. Delo/     Freddi Che, MD 11/22/13 [phone]

## 2013-11-21 NOTE — ED Notes (Signed)
Neb in progress by RT, pt remains calm, NAD, alert, "tired", interactive, cooperative, answering questions appropriately. Skin W&D.

## 2013-11-21 NOTE — Progress Notes (Signed)
Critical ABG result reported to MD.

## 2013-11-21 NOTE — ED Notes (Addendum)
No changes, VSS, SPO2 improved, RT at [facility] to switch pt to vent, PRVC. Remains alert, interactive, calm.

## 2013-11-22 ENCOUNTER — Inpatient Hospital Stay (HOSPITAL_COMMUNITY): Payer: Medicare Other

## 2013-11-22 DIAGNOSIS — I2510 Atherosclerotic heart disease of native coronary artery without angina pectoris: Secondary | ICD-10-CM | POA: Diagnosis present

## 2013-11-22 DIAGNOSIS — E11319 Type 2 diabetes mellitus with unspecified diabetic retinopathy without macular edema: Secondary | ICD-10-CM | POA: Diagnosis present

## 2013-11-22 DIAGNOSIS — I10 Essential (primary) hypertension: Secondary | ICD-10-CM | POA: Diagnosis present

## 2013-11-22 DIAGNOSIS — D631 Anemia in chronic kidney disease: Secondary | ICD-10-CM | POA: Diagnosis not present

## 2013-11-22 DIAGNOSIS — J15211 Pneumonia due to Methicillin susceptible Staphylococcus aureus: Secondary | ICD-10-CM | POA: Diagnosis not present

## 2013-11-22 DIAGNOSIS — R05 Cough: Secondary | ICD-10-CM | POA: Diagnosis not present

## 2013-11-22 DIAGNOSIS — E872 Acidosis, unspecified: Secondary | ICD-10-CM | POA: Diagnosis present

## 2013-11-22 DIAGNOSIS — I509 Heart failure, unspecified: Secondary | ICD-10-CM | POA: Diagnosis not present

## 2013-11-22 DIAGNOSIS — E662 Morbid (severe) obesity with alveolar hypoventilation: Secondary | ICD-10-CM | POA: Diagnosis present

## 2013-11-22 DIAGNOSIS — J811 Chronic pulmonary edema: Secondary | ICD-10-CM | POA: Diagnosis not present

## 2013-11-22 DIAGNOSIS — J189 Pneumonia, unspecified organism: Secondary | ICD-10-CM

## 2013-11-22 DIAGNOSIS — D869 Sarcoidosis, unspecified: Secondary | ICD-10-CM | POA: Diagnosis present

## 2013-11-22 DIAGNOSIS — J9600 Acute respiratory failure, unspecified whether with hypoxia or hypercapnia: Secondary | ICD-10-CM | POA: Diagnosis not present

## 2013-11-22 DIAGNOSIS — E119 Type 2 diabetes mellitus without complications: Secondary | ICD-10-CM | POA: Diagnosis not present

## 2013-11-22 DIAGNOSIS — Z7982 Long term (current) use of aspirin: Secondary | ICD-10-CM | POA: Diagnosis not present

## 2013-11-22 DIAGNOSIS — R059 Cough, unspecified: Secondary | ICD-10-CM | POA: Diagnosis not present

## 2013-11-22 DIAGNOSIS — J690 Pneumonitis due to inhalation of food and vomit: Secondary | ICD-10-CM

## 2013-11-22 DIAGNOSIS — K219 Gastro-esophageal reflux disease without esophagitis: Secondary | ICD-10-CM | POA: Diagnosis present

## 2013-11-22 DIAGNOSIS — Z930 Tracheostomy status: Secondary | ICD-10-CM | POA: Diagnosis not present

## 2013-11-22 DIAGNOSIS — J9819 Other pulmonary collapse: Secondary | ICD-10-CM | POA: Diagnosis not present

## 2013-11-22 DIAGNOSIS — R0902 Hypoxemia: Secondary | ICD-10-CM

## 2013-11-22 DIAGNOSIS — J9620 Acute and chronic respiratory failure, unspecified whether with hypoxia or hypercapnia: Principal | ICD-10-CM

## 2013-11-22 DIAGNOSIS — J9621 Acute and chronic respiratory failure with hypoxia: Secondary | ICD-10-CM | POA: Diagnosis present

## 2013-11-22 DIAGNOSIS — G4733 Obstructive sleep apnea (adult) (pediatric): Secondary | ICD-10-CM | POA: Diagnosis present

## 2013-11-22 DIAGNOSIS — Z87891 Personal history of nicotine dependence: Secondary | ICD-10-CM | POA: Diagnosis not present

## 2013-11-22 DIAGNOSIS — Z6841 Body Mass Index (BMI) 40.0 and over, adult: Secondary | ICD-10-CM | POA: Diagnosis not present

## 2013-11-22 DIAGNOSIS — I428 Other cardiomyopathies: Secondary | ICD-10-CM | POA: Diagnosis not present

## 2013-11-22 DIAGNOSIS — IMO0002 Reserved for concepts with insufficient information to code with codable children: Secondary | ICD-10-CM | POA: Diagnosis not present

## 2013-11-22 DIAGNOSIS — E1139 Type 2 diabetes mellitus with other diabetic ophthalmic complication: Secondary | ICD-10-CM | POA: Diagnosis present

## 2013-11-22 DIAGNOSIS — R0602 Shortness of breath: Secondary | ICD-10-CM | POA: Diagnosis present

## 2013-11-22 DIAGNOSIS — Z9981 Dependence on supplemental oxygen: Secondary | ICD-10-CM | POA: Diagnosis not present

## 2013-11-22 HISTORY — DX: Pneumonitis due to inhalation of food and vomit: J69.0

## 2013-11-22 LAB — CBC
HCT: 42.8 % (ref 36.0–46.0)
Hemoglobin: 12.8 g/dL (ref 12.0–15.0)
MCH: 28.6 pg (ref 26.0–34.0)
MCHC: 29.9 g/dL — ABNORMAL LOW (ref 30.0–36.0)
MCV: 95.5 fL (ref 78.0–100.0)
Platelets: 243 10*3/uL (ref 150–400)
RBC: 4.48 MIL/uL (ref 3.87–5.11)
RDW: 16.1 % — ABNORMAL HIGH (ref 11.5–15.5)
WBC: 12.9 10*3/uL — ABNORMAL HIGH (ref 4.0–10.5)

## 2013-11-22 LAB — PROCALCITONIN: Procalcitonin: <0.1 ng/mL

## 2013-11-22 LAB — BLOOD GAS, ARTERIAL
Acid-Base Excess: 5.8 mmol/L — ABNORMAL HIGH (ref 0.0–2.0)
Acid-Base Excess: 5.8 mmol/L — ABNORMAL HIGH (ref 0.0–2.0)
Bicarbonate: 32.9 mEq/L — ABNORMAL HIGH (ref 20.0–24.0)
Bicarbonate: 32.3 mEq/L — ABNORMAL HIGH (ref 20.0–24.0)
Drawn by: 41308
Drawn by: 41308
FIO2: 60 %
FIO2: 60 %
MECHVT: 450 mL
MECHVT: 450 mL
O2 Saturation: 89.8 %
O2 Saturation: 88.3 %
PEEP: 5 cmH2O
PEEP: 5 cmH2O
Patient temperature: 98.6
Patient temperature: 98.6
RATE: 14 resp/min
RATE: 20 resp/min
TCO2: 35.3 mmol/L (ref 0–100)
TCO2: 34.4 mmol/L (ref 0–100)
pCO2 arterial: 79.8 mmHg (ref 35.0–45.0)
pCO2 arterial: 70.9 mmHg (ref 35.0–45.0)
pH, Arterial: 7.238 — ABNORMAL LOW (ref 7.350–7.450)
pH, Arterial: 7.28 — ABNORMAL LOW (ref 7.350–7.450)
pO2, Arterial: 70 mmHg — ABNORMAL LOW (ref 80.0–100.0)
pO2, Arterial: 65.9 mmHg — ABNORMAL LOW (ref 80.0–100.0)

## 2013-11-22 LAB — BASIC METABOLIC PANEL
Anion gap: 12 (ref 5–15)
BUN: 15 mg/dL (ref 6–23)
CO2: 30 mEq/L (ref 19–32)
Calcium: 7.5 mg/dL — ABNORMAL LOW (ref 8.4–10.5)
Chloride: 102 mEq/L (ref 96–112)
Creatinine, Ser: 0.86 mg/dL (ref 0.50–1.10)
GFR calc Af Amer: >90 mL/min (ref >90)
GFR calc non Af Amer: 81 mL/min — ABNORMAL LOW (ref >90)
Glucose, Bld: 157 mg/dL — ABNORMAL HIGH (ref 70–99)
Potassium: 3.9 mEq/L (ref 3.7–5.3)
Sodium: 144 mEq/L (ref 137–147)

## 2013-11-22 LAB — GLUCOSE, CAPILLARY
Glucose-Capillary: 136 mg/dL — ABNORMAL HIGH (ref 70–99)
Glucose-Capillary: 140 mg/dL — ABNORMAL HIGH (ref 70–99)
Glucose-Capillary: 148 mg/dL — ABNORMAL HIGH (ref 70–99)
Glucose-Capillary: 156 mg/dL — ABNORMAL HIGH (ref 70–99)
Glucose-Capillary: 173 mg/dL — ABNORMAL HIGH (ref 70–99)
Glucose-Capillary: 180 mg/dL — ABNORMAL HIGH (ref 70–99)

## 2013-11-22 LAB — CORTISOL: Cortisol, Plasma: 11 ug/dL

## 2013-11-22 LAB — MRSA PCR SCREENING: MRSA by PCR: NEGATIVE

## 2013-11-22 MED ORDER — HEPARIN SODIUM (PORCINE) 5000 UNIT/ML IJ SOLN
5000.0000 [IU] | Freq: Three times a day (TID) | INTRAMUSCULAR | Status: DC
  Administered 2013-11-22 – 2013-11-26 (×12): 5000 [IU] via SUBCUTANEOUS
  Filled 2013-11-22 (×16): qty 1

## 2013-11-22 MED ORDER — BIOTENE DRY MOUTH MT LIQD
15.0000 mL | Freq: Four times a day (QID) | OROMUCOSAL | Status: DC
  Administered 2013-11-22 – 2013-11-26 (×14): 15 mL via OROMUCOSAL

## 2013-11-22 MED ORDER — INSULIN ASPART 100 UNIT/ML ~~LOC~~ SOLN
2.0000 [IU] | SUBCUTANEOUS | Status: DC
  Administered 2013-11-22: 2 [IU] via SUBCUTANEOUS
  Administered 2013-11-22: 4 [IU] via SUBCUTANEOUS
  Administered 2013-11-22: 2 [IU] via SUBCUTANEOUS

## 2013-11-22 MED ORDER — CHLORHEXIDINE GLUCONATE 0.12 % MT SOLN
15.0000 mL | Freq: Two times a day (BID) | OROMUCOSAL | Status: DC
  Administered 2013-11-22 – 2013-11-24 (×6): 15 mL via OROMUCOSAL
  Filled 2013-11-22 (×7): qty 15

## 2013-11-22 MED ORDER — PANTOPRAZOLE SODIUM 40 MG IV SOLR
40.0000 mg | Freq: Every day | INTRAVENOUS | Status: DC
  Administered 2013-11-22: 40 mg via INTRAVENOUS
  Filled 2013-11-22: qty 40

## 2013-11-22 MED ORDER — ALBUTEROL SULFATE (2.5 MG/3ML) 0.083% IN NEBU: 2.5000 mg | INHALATION_SOLUTION | RESPIRATORY_TRACT | Status: DC | PRN

## 2013-11-22 MED ORDER — HYDROCORTISONE NA SUCCINATE PF 100 MG IJ SOLR
50.0000 mg | Freq: Two times a day (BID) | INTRAMUSCULAR | Status: DC
  Administered 2013-11-22 – 2013-11-24 (×4): 50 mg via INTRAVENOUS
  Filled 2013-11-22 (×6): qty 1

## 2013-11-22 MED ORDER — FUROSEMIDE 10 MG/ML IJ SOLN
40.0000 mg | Freq: Once | INTRAMUSCULAR | Status: AC
  Administered 2013-11-22: 40 mg via INTRAVENOUS
  Filled 2013-11-22: qty 4

## 2013-11-22 MED ORDER — PIPERACILLIN-TAZOBACTAM 3.375 G IVPB
3.3750 g | Freq: Three times a day (TID) | INTRAVENOUS | Status: DC
  Administered 2013-11-22 – 2013-11-25 (×11): 3.375 g via INTRAVENOUS
  Filled 2013-11-22 (×13): qty 50

## 2013-11-22 MED ORDER — METOPROLOL TARTRATE 1 MG/ML IV SOLN
2.5000 mg | Freq: Four times a day (QID) | INTRAVENOUS | Status: DC
  Administered 2013-11-22 – 2013-11-24 (×8): 2.5 mg via INTRAVENOUS
  Filled 2013-11-22 (×12): qty 5

## 2013-11-22 MED ORDER — POTASSIUM CHLORIDE 10 MEQ/100ML IV SOLN
10.0000 meq | INTRAVENOUS | Status: AC
  Administered 2013-11-22 (×4): 10 meq via INTRAVENOUS
  Filled 2013-11-22 (×4): qty 100

## 2013-11-22 MED ORDER — SODIUM CHLORIDE 0.9 % IV SOLN: 250.0000 mL | INTRAVENOUS | Status: DC | PRN

## 2013-11-22 MED ORDER — SODIUM CHLORIDE 0.45 % IV SOLN
INTRAVENOUS | Status: DC
Start: 2013-11-22 — End: 2013-11-22
  Administered 2013-11-22: 02:00:00 via INTRAVENOUS

## 2013-11-22 MED ORDER — HYDROCORTISONE NA SUCCINATE PF 100 MG IJ SOLR
50.0000 mg | Freq: Four times a day (QID) | INTRAMUSCULAR | Status: DC
  Administered 2013-11-22 (×2): 50 mg via INTRAVENOUS
  Filled 2013-11-22 (×6): qty 1

## 2013-11-22 MED ORDER — CARVEDILOL 3.125 MG PO TABS
3.1250 mg | ORAL_TABLET | Freq: Two times a day (BID) | ORAL | Status: DC
  Filled 2013-11-22 (×3): qty 1

## 2013-11-22 MED ORDER — INSULIN ASPART 100 UNIT/ML ~~LOC~~ SOLN
0.0000 [IU] | SUBCUTANEOUS | Status: DC
  Administered 2013-11-22: 3 [IU] via SUBCUTANEOUS
  Administered 2013-11-22 – 2013-11-23 (×5): 2 [IU] via SUBCUTANEOUS
  Administered 2013-11-24: 5 [IU] via SUBCUTANEOUS
  Administered 2013-11-24: 3 [IU] via SUBCUTANEOUS
  Administered 2013-11-24: 5 [IU] via SUBCUTANEOUS
  Administered 2013-11-25 (×3): 2 [IU] via SUBCUTANEOUS

## 2013-11-22 NOTE — ED Notes (Signed)
Patient aware we are waiting for a room assignment.

## 2013-11-22 NOTE — Progress Notes (Signed)
PULMONARY / CRITICAL CARE MEDICINE   Name: Melody Gray MRN: [id number] DOB: [birth year]    ADMISSION DATE:  11/21/2013 CONSULTATION DATE:  11/22/2013  REFERRING MD :  EDP  CHIEF COMPLAINT:  SOB  INITIAL PRESENTATION: Pt has a history of sarcoidosis dx in 2001 and O2 dependent since 2011. Also with h/o PAH and OHS requiring trach earlier this year. Presenting to ED with hypoxia and hypercarbic respiratory failure. Trach changed to cuffed in ED for placement on vent, significant improvement on this.  STUDIES:  7/24: Bedside ultrasound:  Small R pleural effusion.  Consolidation of RLL  SIGNIFICANT EVENTS: 7/24 - admitted to ICU with hypoxic and hypercarbic respiratory failure  CULTURES: Blood x2 7/24>>> Trach aspirate 7/24>>>  ANTIBIOTICS: Vanc 7/24>>> Zosyn 7/24>>>  LINES: Trach (chronic)>>   SUBJECTIVE: No complaints, continued pink tinged secretions  VITAL SIGNS: Temp:  [98.6 F (37 C)-100.9 F (38.3 C)] 98.6 F (37 C) (07/25 0432) Pulse Rate:  [95-128] 95 (07/25 0600) Resp:  [15-30] 20 (07/25 0600) BP: (97-165)/(60-94) 97/60 mmHg (07/25 0600) SpO2:  [57 %-100 %] 95 % (07/25 0600) FiO2 (%):  [60 %-98 %] 60 % (07/25 0400) Weight:  [246 lb 4.1 oz (111.7 kg)] 246 lb 4.1 oz (111.7 kg) (07/25 0300)  HEMODYNAMICS:   VENTILATOR SETTINGS: Vent Mode:  [-] PRVC FiO2 (%):  [60 %-98 %] 60 % Set Rate:  [14 bmp-20 bmp] 20 bmp Vt Set:  [450 mL] 450 mL PEEP:  [5 cmH20] 5 cmH20 Plateau Pressure:  [30 cmH20-39 cmH20] 30 cmH20  INTAKE / OUTPUT:  Intake/Output Summary (Last 24 hours) at 11/22/13 0704 Last data filed at 11/22/13 0600  Gross per 24 hour  Intake 529.17 ml  Output      0 ml  Net 529.17 ml    PHYSICAL EXAMINATION: General:  Obese woman, trach with vent, NAD Neuro:  Alert and oriented, no focal deficits HEENT:  NCAT, MMM, EOMI Cardiovascular:  RRR, no murmur Lungs: rhonchorous R>L, good air movement throughout Abdomen:  Soft, NTND Musculoskeletal: 2+  pitting edema BLE Skin:  intact  LABS:  CBC  Recent Labs Lab 11/21/13 2205 11/22/13 0229  WBC 16.6* 12.9*  HGB 13.6 12.8  HCT 45.3 42.8  PLT 279 243   Coag's No results found for this basename: APTT, INR,  in the last 168 hours BMET  Recent Labs Lab 11/21/13 2205 11/22/13 0229  NA 140 144  K 5.0 3.9  CL 100 102  CO2 29 30  BUN 16 15  CREATININE 0.83 0.86  GLUCOSE 200* 157*   Electrolytes  Recent Labs Lab 11/21/13 2205 11/22/13 0229  CALCIUM 7.9* 7.5*   Sepsis Markers  Recent Labs Lab 11/21/13 2215  LATICACIDVEN 1.18   ABG  Recent Labs Lab 11/21/13 2238 11/22/13 0350 11/22/13 0515  PHART 7.254* 7.238* 7.280*  PCO2ART 77.3* 79.8* 70.9*  PO2ART 58.0* 70.0* 65.9*   Liver Enzymes  Recent Labs Lab 11/21/13 2205  AST 31  ALT 12  ALKPHOS 98  BILITOT 0.4  ALBUMIN 3.2*   Cardiac Enzymes  Recent Labs Lab 11/21/13 2205  PROBNP 4425.0*   Glucose  Recent Labs Lab 11/22/13 0053 11/22/13 0405  GLUCAP 148* 156*    Imaging Reviewed  ASSESSMENT / PLAN:  PULMONARY A: Acute hypercarbic respiratory failure, hypoxia Sarcoidosis - on chronic steroids Trach dependent OHS/OSA Likely PNA P:   Cont full vent support - settings reviewed and/or adjusted Cont vent bundle Wean in PSV mode as tolerated Daily SBT if/when meets criteria  CARDIOVASCULAR A: HFpEF PAH HTN CAD  P:  Wean stress dose steroids Holding home antihypertensives (amlodipine, irbesartan)  Low dose IV metoprolol - change back to carvedilol when taking POs   RENAL A:  No acute issues Risk for AKI Chronic hypervolemia - porbably near baseline P:   Monitor BMET intermittently Monitor I/Os Correct electrolytes as indicated Resume home dose of furosemide in IV form  GASTROINTESTINAL A:  GERD P:   SUP: PPI NPO while on vent Consider NGT if not weaned successfully in next day or two  HEMATOLOGIC A:  No acute issues P:  DVT px: SQ heparin Monitor CBC  intermittently Transfuse per usual ICU guidelines  INFECTIOUS A:  Likely HCAP P:   Broad spectrum coverage, vanc/zosyn Follow blood and sputum cultures  ENDOCRINE A:  DM   P:   Hold home metformin, levemir CBG/SSI q4  NEUROLOGIC A: No acute issues P:   Monitor   Beverlyn Roux, MD, MPH [facility] PGY-2  PCCM ATTENDING: I have interviewed and examined the patient and reviewed the database. I have formulated the assessment and plan as reflected in the note above with amendments made by me. 40 mins of direct critical care time provided  Asymmetric AS dz on CXR with purulent resp secretions - likely PNA Transfer to vent SDU bed ordered Rest as above  Merton Border, MD;  PCCM service; Mobile [phone]  11/22/2013 7:04 AM

## 2013-11-22 NOTE — ED Notes (Signed)
Patient requested to be suctioned.  Resp therapy in to suction.

## 2013-11-22 NOTE — Progress Notes (Signed)
Spoke with Dr. Jimmy Footman about repeat ABG, no new orders at this time.

## 2013-11-22 NOTE — ED Provider Notes (Signed)
I saw and evaluated the patient, reviewed the resident's note and I agree with the findings and plan. Patient is a 43 year old female with history of advanced sarcoidosis requiring tracheostomy. She was sent from an extended care facility for evaluation of respiratory distress. She has little history do to her tracheostomy, however she does admit to feeling short of breath.  On exam, she is tachycardic, tachypneic and oxygen saturations are low. She is febrile with a temperature of 100.9. She was placed on 100% oxygen with improved oxygen saturations. She arrived tachycardic with heart rate in the 140s. She appears acutely on chronically ill. Heart is tachycardic. Lungs have rales bilaterally. Abdomen is soft, nontender. Extremities are without edema.  Workup reveals bilateral pleural effusions and pulmonary edema on chest x-ray. The trach was changed by respiratory and critical care has been consulted to evaluate the patient. We will treat for both CHF exacerbation and pneumonia.   EKG Interpretation   Date/Time:  Friday November 21 2013 22:05:19 EDT Ventricular Rate:  141 PR Interval:  198 QRS Duration: 124 QT Interval:  343 QTC Calculation: 525 R Axis:   -148 Text Interpretation:  Sinus tachycardia Baseline wander in lead(s) I aVL  aVF V1 V2 V6 Confirmed by DELOS  MD, Jenia Klepper ([id number]) on 11/21/2013 10:15:02  PM     CRITICAL CARE Performed by: Veryl Speak Total critical care time: 30 minutes Critical care time was exclusive of separately billable procedures and treating other patients. Critical care was necessary to treat or prevent imminent or life-threatening deterioration. Critical care was time spent personally by me on the following activities: development of treatment plan with patient and/or surrogate as well as nursing, discussions with consultants, evaluation of patient's response to treatment, examination of patient, obtaining history from patient or surrogate, ordering and  performing treatments and interventions, ordering and review of laboratory studies, ordering and review of radiographic studies, pulse oximetry and re-evaluation of patient's condition.    Veryl Speak, MD 11/22/13 [phone]

## 2013-11-22 NOTE — Progress Notes (Signed)
RT arrived to find patient in Resp Distress. Pt has #6 cuffless Shiley trach. Increased WOB, decreased SATS. Pt very anxious repeating "I can't breathe". Pt placed on 60% ATC with SATS 80%, Titrated up to 98% with Sats 88%. Minimal yellow secretions suctioned from trach. Inner cannula patent. BBS Rhonchi, Wheeze RUL/LUL. Albuterol treatment given with little relief. Pt still complained of difficulty.  ABG drawn: pH 7.25, pCO2 77.3, pO2 58, HCO3 33.7, Sats 80% on 98% ATC. Dr. Freddi Che instructed RT to change trach to #6 Cuffed Shiley and to place PT on vent.  RN pulled #6 cuffed Shiley from pyxis. RT bagged pt on 100% O2/ Sats 96%. Cuffless trach removed, Cuffed trach inserted into stoma without difficulty,minimal bleeding, cuff inflated, CO2 detector placed on trach and bagged with positive color change. Pt bagged to 100%. Pt placed on Vent Vt 500, RR 14, FiO2 100%, Peep +5. Pt immediately stated that she felt much better. Very thankful. Sats remained in high 90's. Pt WOB decreased. Upon checking back patient found asleep and resting. PT transported to [place].

## 2013-11-22 NOTE — Progress Notes (Signed)
INITIAL NUTRITION ASSESSMENT  DOCUMENTATION CODES Per approved criteria  -Morbid Obesity   INTERVENTION: If pt remains intubate >48 hours recommend starting TF: Utilize 50M PEPuP Protocol: initiate TF via NGT with Vital High protein at 25 ml/h on day 1; on day 2, increase to goal rate of 50 ml/h (1200 ml per day) to provide 1200 kcals, 105 gm protein, 1003 ml free water daily. RD to continue to monitor   NUTRITION DIAGNOSIS: Inadequate oral intake related to inability to eat as evidenced by NPO status.   Goal: Enteral nutrition to provide 60-70% of estimated calorie needs (22-25 kcals/kg ideal body weight) and 100% of estimated protein needs, based on ASPEN guidelines for permissive underfeeding in critically ill obese individuals   Monitor:  Vent status, TF initiation, weight trend, labs  Reason for Assessment: Vent  43 y.o. female  Admitting Dx: <principal problem not specified>  ASSESSMENT: Pt has a history of sarcoidosis dx in 2001 and O2 dependent since 2011. Also with h/o PAH and OHS requiring trach earlier this year. Presenting to ED with hypoxia and hypercarbic respiratory failure. Trach changed to cuffed in ED for placement on vent, significant improvement on this. Pt has history of diabetes, CHF and hypertension.   Per MD note, NPO while on vent- Consider NGT if not weaned successfully in next day or two.    Patient is currently intubated on ventilator support MV: 7.3 L/min Temp (24hrs), Avg:99.2 F (37.3 C), Min:98.5 F (36.9 C), Max:100.9 F (38.3 C)  Propofol: none  Height: Ht Readings from Last 1 Encounters:  11/22/13 5\' 1"  (1.549 m)    Weight: Wt Readings from Last 1 Encounters:  11/22/13 246 lb 4.1 oz (111.7 kg)    Ideal Body Weight: 105 lbs  % Ideal Body Weight: 234%  Wt Readings from Last 10 Encounters:  11/22/13 246 lb 4.1 oz (111.7 kg)  11/19/13 242 lb (109.77 kg)  11/17/13 240 lb 4 oz (108.977 kg)  11/14/13 241 lb (109.317 kg)  11/11/13  243 lb 12.8 oz (110.587 kg)  11/04/13 240 lb 8 oz (109.09 kg)  10/10/13 240 lb 6.4 oz (109.045 kg)  10/08/13 233 lb (105.688 kg)  10/01/13 236 lb (107.049 kg)  09/12/13 235 lb 6.4 oz (106.777 kg)    Usual Body Weight: 235 lb  % Usual Body Weight: 105%  BMI:  Body mass index is 46.55 kg/(m^2).  Estimated Nutritional Needs: Kcal: 2050 (goal 1230-1435) Protein: 95-120 grams Fluid: per MD  Skin: +1 RLE and LLE edema; intact  Diet Order: NPO  EDUCATION NEEDS: -No education needs identified at this time   Intake/Output Summary (Last 24 hours) at 11/22/13 0929 Last data filed at 11/22/13 0800  Gross per 24 hour  Intake 529.17 ml  Output    275 ml  Net 254.17 ml    Last BM: PTA  Labs:   Recent Labs Lab 11/21/13 2205 11/22/13 0229  NA 140 144  K 5.0 3.9  CL 100 102  CO2 29 30  BUN 16 15  CREATININE 0.83 0.86  CALCIUM 7.9* 7.5*  GLUCOSE 200* 157*    CBG (last 3)   Recent Labs  11/22/13 0053 11/22/13 0405 11/22/13 0721  GLUCAP 148* 156* 173*    Scheduled Meds: . antiseptic oral rinse  15 mL Mouth Rinse QID  . carvedilol  3.125 mg Oral BID WC  . chlorhexidine  15 mL Mouth Rinse BID  . heparin  5,000 Units Subcutaneous 3 times per day  . hydrocortisone sod  succinate (SOLU-CORTEF) inj  50 mg Intravenous 4 times per day  . insulin aspart  2-6 Units Subcutaneous 6 times per day  . pantoprazole (PROTONIX) IV  40 mg Intravenous Daily  . piperacillin-tazobactam (ZOSYN)  IV  3.375 g Intravenous 3 times per day  . vancomycin  1,000 mg Intravenous Q12H    Continuous Infusions:   Past Medical History  Diagnosis Date  . Sarcoidosis     Followed by Dr. Melvyn Novas; w/ liver involvement per biopsy 12/09, Reversible airway component so started on [facility] 01/2010; HFA 75% p coaching 05/2010  . Hypoxemia     CT angiogram 9/11>> No PE; PFTs 10/11- FEV1 1.20 (49%) with 16% better p B2, DLCO 33%> corrects to 84; O2 sats ok on 4 lpm X rapid walk X 3 laps 05/2010  . Morbid  obesity     Target wt= 153 for BMI <30  . QT prolongation   . Diabetes mellitus   . Hypertension   . Hx of cardiac cath 2/08    No CAD, no RAS,  normal EF  . Seborrheic dermatitis of scalp   . 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 Hep B serology neg. but Hep C ab +, HIV neg. 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 Cardiomyopathy, nonischemic     EF 45% 12/10; Echo 7/11 normal EF, PAS 48  . Diabetic retinopathy     Right eye 2/11  . Health maintenance examination     Mammogram 05/2010 Negative; Last Pap smear 03/2008; Last DM eye exam 2/11> mild non-proliferative diabetic retinopathy. OD  . Helicobacter pylori ab+ 05/2011    Pt was symptomatic and treatment planned for 05/2011  . CHF (congestive heart failure)     Past Surgical History  Procedure Laterality Date  . Tubal ligation    . Breast surgery    . Cesarean section    . Tracheostomy tube placement N/A 08/10/2013    Procedure: TRACHEOSTOMY;  Surgeon: Melissa Montane, MD;  Location: [place];  Service: ENT;  Laterality: N/A;    Pryor Ochoa RD, LDN Inpatient Clinical Dietitian Pager: [phone] After Hours Pager: [phone]

## 2013-11-23 ENCOUNTER — Inpatient Hospital Stay (HOSPITAL_COMMUNITY): Payer: Medicare Other

## 2013-11-23 DIAGNOSIS — I509 Heart failure, unspecified: Secondary | ICD-10-CM

## 2013-11-23 DIAGNOSIS — Z930 Tracheostomy status: Secondary | ICD-10-CM

## 2013-11-23 DIAGNOSIS — I428 Other cardiomyopathies: Secondary | ICD-10-CM

## 2013-11-23 LAB — BASIC METABOLIC PANEL
Anion gap: 12 (ref 5–15)
BUN: 21 mg/dL (ref 6–23)
CO2: 30 mEq/L (ref 19–32)
Calcium: 7.7 mg/dL — ABNORMAL LOW (ref 8.4–10.5)
Chloride: 101 mEq/L (ref 96–112)
Creatinine, Ser: 1.56 mg/dL — ABNORMAL HIGH (ref 0.50–1.10)
GFR calc Af Amer: 46 mL/min — ABNORMAL LOW (ref >90)
GFR calc non Af Amer: 39 mL/min — ABNORMAL LOW (ref >90)
Glucose, Bld: 125 mg/dL — ABNORMAL HIGH (ref 70–99)
Potassium: 4.4 mEq/L (ref 3.7–5.3)
Sodium: 143 mEq/L (ref 137–147)

## 2013-11-23 LAB — GLUCOSE, CAPILLARY
Glucose-Capillary: 115 mg/dL — ABNORMAL HIGH (ref 70–99)
Glucose-Capillary: 116 mg/dL — ABNORMAL HIGH (ref 70–99)
Glucose-Capillary: 118 mg/dL — ABNORMAL HIGH (ref 70–99)
Glucose-Capillary: 135 mg/dL — ABNORMAL HIGH (ref 70–99)
Glucose-Capillary: 140 mg/dL — ABNORMAL HIGH (ref 70–99)
Glucose-Capillary: 141 mg/dL — ABNORMAL HIGH (ref 70–99)

## 2013-11-23 LAB — PROCALCITONIN: Procalcitonin: <0.1 ng/mL

## 2013-11-23 MED ORDER — FUROSEMIDE 10 MG/ML IJ SOLN
20.0000 mg | Freq: Every day | INTRAMUSCULAR | Status: DC
  Administered 2013-11-23 – 2013-11-26 (×4): 20 mg via INTRAVENOUS
  Filled 2013-11-23 (×4): qty 2

## 2013-11-23 MED ORDER — VANCOMYCIN HCL 10 G IV SOLR
1500.0000 mg | INTRAVENOUS | Status: DC
  Administered 2013-11-24 (×2): 1500 mg via INTRAVENOUS
  Filled 2013-11-23 (×3): qty 1500

## 2013-11-23 NOTE — Progress Notes (Signed)
CCMD notification of pt desats 80-84%, upon arriving in pt's room sats were 82%, but fell quickly to 74%.  Pt is easily arousable and sats improved once awake, but as she returned to sleep her sats once again fell to 85%.  Pt was once again aroused and sats recovered spontaneously to 97%.  Respiratory was called to assess need for ventilator while pt is sleeping.  Will continue to monitor.

## 2013-11-23 NOTE — Progress Notes (Signed)
PULMONARY / CRITICAL CARE MEDICINE   Name: Melody Gray MRN: [id number] DOB: 19-Jan-1971    ADMISSION DATE:  11/21/2013 CONSULTATION DATE:  11/23/2013  REFERRING MD :  EDP  CHIEF COMPLAINT:  SOB  INITIAL PRESENTATION: Pt has a history of sarcoidosis dx in 2001 and O2 dependent since 2011. Also with h/o PAH and OHS requiring trach earlier this year. Presenting to ED with hypoxia and hypercarbic respiratory failure. Trach changed to cuffed in ED for placement on vent, significant improvement on this.  STUDIES:  7/24: Bedside ultrasound:  Small R pleural effusion.  Consolidation of RLL  SIGNIFICANT EVENTS: 7/24 - admitted to ICU with hypoxic and hypercarbic respiratory failure  CULTURES: Blood x2 7/24>>> Trach aspirate 7/24>>>  ANTIBIOTICS: Vanc 7/24>>> Zosyn 7/24>>>  LINES: Trach (chronic)>>   SUBJECTIVE: No complaints, continued pink tinged secretions. Would like to eat.  VITAL SIGNS: Temp:  [97.5 F (36.4 C)-98.5 F (36.9 C)] 98.5 F (36.9 C) (07/26 0800) Pulse Rate:  [77-109] 81 (07/26 0805) Resp:  [14-26] 14 (07/26 0805) BP: (105-136)/(63-90) 116/72 mmHg (07/26 0800) SpO2:  [81 %-98 %] 96 % (07/26 0805) FiO2 (%):  [40 %-60 %] 40 % (07/26 0805) Weight:  [110.7 kg (244 lb 0.8 oz)-110.859 kg (244 lb 6.4 oz)] 110.7 kg (244 lb 0.8 oz) (07/26 0408)  HEMODYNAMICS:   VENTILATOR SETTINGS: Vent Mode:  [-] PRVC FiO2 (%):  [40 %-60 %] 40 % Set Rate:  [14 bmp] 14 bmp Vt Set:  [500 mL] 500 mL PEEP:  [5 cmH20] 5 cmH20 Pressure Support:  [10 cmH20] 10 cmH20  INTAKE / OUTPUT:  Intake/Output Summary (Last 24 hours) at 11/23/13 0953 Last data filed at 11/23/13 0618  Gross per 24 hour  Intake   1330 ml  Output    895 ml  Net    435 ml    PHYSICAL EXAMINATION: General:  Obese woman, up in chair, smiling.  Neuro:  Alert and oriented, no focal deficits HEENT:  NCAT, MMM, EOMI. Trach slight blood on dressing. Trach collar. Cardiovascular:  RRR, no murmur Lungs: fine  rhonchi, good air movement throughout for this body habitus Abdomen:  Soft, NTND Musculoskeletal: 2+ pitting edema BLE Skin:  intact  LABS:  CBC  Recent Labs Lab 11/21/13 2205 11/22/13 0229  WBC 16.6* 12.9*  HGB 13.6 12.8  HCT 45.3 42.8  PLT 279 243   Coag's No results found for this basename: APTT, INR,  in the last 168 hours BMET  Recent Labs Lab 11/21/13 2205 11/22/13 0229 11/23/13 0350  NA 140 144 143  K 5.0 3.9 4.4  CL 100 102 101  CO2 29 30 30   BUN 16 15 21   CREATININE 0.83 0.86 1.56*  GLUCOSE 200* 157* 125*   Electrolytes  Recent Labs Lab 11/21/13 2205 11/22/13 0229 11/23/13 0350  CALCIUM 7.9* 7.5* 7.7*   Sepsis Markers  Recent Labs Lab 11/21/13 0130 11/21/13 2215 11/23/13 0350  LATICACIDVEN  --  1.18  --   PROCALCITON <0.10  --  <0.10   ABG  Recent Labs Lab 11/21/13 2238 11/22/13 0350 11/22/13 0515  PHART 7.254* 7.238* 7.280*  PCO2ART 77.3* 79.8* 70.9*  PO2ART 58.0* 70.0* 65.9*   Liver Enzymes  Recent Labs Lab 11/21/13 2205  AST 31  ALT 12  ALKPHOS 98  BILITOT 0.4  ALBUMIN 3.2*   Cardiac Enzymes  Recent Labs Lab 11/21/13 2205  PROBNP 4425.0*   Glucose  Recent Labs Lab 11/22/13 1108 11/22/13 1532 11/22/13 2010 11/23/13 0009 11/23/13 [id number]  11/23/13 0741  GLUCAP 180* 140* 136* 140* 116* 118*    Imaging Reviewed CXR 7/26- CE, CHF. Can't exclude pneumonia  ASSESSMENT / PLAN:  PULMONARY A: Acute hypercarbic respiratory failure, hypoxia Sarcoidosis - on chronic steroids Trach dependent OHS/OSA Likely PNA CHF P:   Cont full vent support - settings reviewed and/or adjusted Cont vent bundle Wean in PSV mode as tolerated Daily SBT if/when meets criteria  CARDIOVASCULAR A: HFpEF PAH HTN CAD CHF by CXR  P:  Wean stress dose steroids Holding home antihypertensives (amlodipine, irbesartan)  Low dose IV metoprolol - change back to carvedilol when taking POs Diureses as tolerated   RENAL A:  No  acute issues Risk for AKI Chronic hypervolemia - probably near baseline. Creat up after single dose lasix 7/25 P:   Monitor BMET intermittently Monitor I/Os Correct electrolytes as indicated IV Lasix, watching BMT  GASTROINTESTINAL A:  GERD P:   SUP: PPI NPO while on vent Consider NGT if not weaned successfully in next day or two. Consider SLP for swallow eval  HEMATOLOGIC A:  No acute issues P:  DVT px: SQ heparin Monitor CBC intermittently Transfuse per usual ICU guidelines  INFECTIOUS A:  Likely HCAP P:   Broad spectrum coverage, vanc/zosyn Follow blood and sputum cultures  ENDOCRINE A:  DM   P:   Hold home metformin, levemir CBG/SSI q4  NEUROLOGIC A: No acute issues P:   Monitor    CD Starr, [place] [phone]   p [id number]   11/23/2013 9:53 AM

## 2013-11-23 NOTE — Evaluation (Signed)
Physical Therapy Evaluation Patient Details Name: Melody Gray MRN: [id number] DOB: 1971/04/20 Today's Date: 11/23/2013   History of Present Illness  Pt has a history of sarcoidosis dx in 2001 and O2 dependent since 2011. Also with h/o PAH and OHS requiring trach earlier this year. Presenting to ED with hypoxia and hypercarbic respiratory failure. Trach changed to cuffed in ED for placement on vent, significant improvement on this. off vent and on Trach collar 40% O2 at time of PT eval  Clinical Impression   Patient evaluated by Physical Therapy with no further acute PT needs identified. All education has been completed and the patient has no further questions.  See below for any follow-up Physical Therapy or equipment needs. PT is signing off. Thank you for this referral.  Encouraged pt to ambulate in hallways at least 1x/day     Follow Up Recommendations No PT follow up    Equipment Recommendations  None recommended by PT    Recommendations for Other Services       Precautions / Restrictions Precautions Precaution Comments: 40% TC      Mobility  Bed Mobility                  Transfers Overall transfer level: Independent Equipment used: None                Ambulation/Gait Ambulation/Gait assistance: Modified independent (Device/Increase time) Ambulation Distance (Feet): 700 Feet Assistive device: None       General Gait Details: steady but with slower speed than age appropriate.  Stairs Stairs:  (Reports is confident in ability to manage her stairs)          Wheelchair Mobility    Modified Rankin (Stroke Patients Only)       Balance Overall balance assessment: Independent                                           Pertinent Vitals/Pain HR incr to 128 observed max with activity  Settled down to 105 with seated rest    Home Living Family/patient expects to be discharged to:: Private residence Living Arrangements:  Children Available Help at Discharge: Family;Available 24 hours/day;Neighbor Type of Home: House Home Access: Stairs to enter Entrance Stairs-Rails: Left Entrance Stairs-Number of Steps: 5 Home Layout: One level Home Equipment: Crutches;Cane - single point (Oxygen)      Prior Function Level of Independence: Independent         Comments: on disability, uses motorized w/c in grocery, can perform meal prep and housekeeping, but her children and boyfriend tend to do it for her     Hand Dominance   Dominant Hand: Right    Extremity/Trunk Assessment   Upper Extremity Assessment: Overall WFL for tasks assessed           Lower Extremity Assessment: Overall WFL for tasks assessed      Cervical / Trunk Assessment: Normal  Communication   Communication: Other (comment) (preferred typing on tablet for communication today)  Cognition Arousal/Alertness: Awake/alert Behavior During Therapy: WFL for tasks assessed/performed Overall Cognitive Status: Within Functional Limits for tasks assessed                      General Comments      Exercises        Assessment/Plan    PT Assessment Patent does not need any  further PT services  PT Diagnosis     PT Problem List    PT Treatment Interventions     PT Goals (Current goals can be found in the Care Plan section) Acute Rehab PT Goals Patient Stated Goal: REALLY wanting to eat PT Goal Formulation: No goals set, d/c therapy    Frequency     Barriers to discharge        Co-evaluation               End of Session Equipment Utilized During Treatment: Oxygen Activity Tolerance: Patient tolerated treatment well Patient left: in chair;with call bell/phone within reach Nurse Communication: Mobility status         Time: 1355-1413 PT Time Calculation (min): 18 min   Charges:   PT Evaluation $Initial PT Evaluation Tier I: 1 Procedure PT Treatments $Gait Training: 8-22 mins   PT G Codes:           Quin Hoop 11/23/2013, 5:24 PM  Roney Marion, [place] Pager [phone] Office [phone]

## 2013-11-23 NOTE — Progress Notes (Signed)
ANTIBIOTIC CONSULT NOTE - FOLLOW UP  Pharmacy Consult for vancomycin Indication: rule out pneumonia  Allergies  Allergen Reactions  . Vicodin [Hydrocodone-Acetaminophen] Itching    Patient Measurements: Height: 5\' 1"  (154.9 cm) Weight: 244 lb 0.8 oz (110.7 kg) IBW/kg (Calculated) : 47.8  Vital Signs: Temp: 97.9 F (36.6 C) (07/26 0408) Temp src: Axillary (07/26 0408) BP: 122/75 mmHg (07/26 0700) Pulse Rate: 84 (07/26 0700) Intake/Output from previous day: 07/25 0701 - 07/26 0700 In: 1370 [I.V.:420; IV Piggyback:950] Out: 1170 [Urine:1170] Intake/Output from this shift:    Labs:  Recent Labs  11/21/13 2205 11/22/13 0229 11/23/13 0350  WBC 16.6* 12.9*  --   HGB 13.6 12.8  --   PLT 279 243  --   CREATININE 0.83 0.86 1.56*   Estimated Creatinine Clearance: 53 ml/min (by C-G formula based on Cr of 1.56). No results found for this basename: VANCOTROUGH, Corlis Leak, VANCORANDOM, [place], GENTPEAK, GENTRANDOM, TOBRATROUGH, TOBRAPEAK, TOBRARND, AMIKACINPEAK, AMIKACINTROU, AMIKACIN,  in the last 72 hours   Microbiology: Recent Results (from the past 720 hour(s))  MRSA PCR SCREENING     Status: None   Collection Time    10/27/13  9:11 PM      Result Value Ref Range Status   MRSA by PCR NEGATIVE  NEGATIVE Final   Comment:            The GeneXpert MRSA Assay (FDA     approved for NASAL specimens     only), is one component of a     comprehensive MRSA colonization     surveillance program. It is not     intended to diagnose MRSA     infection nor to guide or     monitor treatment for     MRSA infections.  MRSA PCR SCREENING     Status: None   Collection Time    11/22/13 12:55 AM      Result Value Ref Range Status   MRSA by PCR NEGATIVE  NEGATIVE Final   Comment:            The GeneXpert MRSA Assay (FDA     approved for NASAL specimens     only), is one component of a     comprehensive MRSA colonization     surveillance program. It is not     intended to  diagnose MRSA     infection nor to guide or     monitor treatment for     MRSA infections.    Anti-infectives   Start     Dose/Rate Route Frequency Ordered Stop   11/22/13 1100  vancomycin (VANCOCIN) IVPB 1000 mg/200 mL premix     1,000 mg 200 mL/hr over 60 Minutes Intravenous Every 12 hours 11/21/13 2314     11/22/13 0600  piperacillin-tazobactam (ZOSYN) IVPB 3.375 g     3.375 g 12.5 mL/hr over 240 Minutes Intravenous 3 times per day 11/22/13 0137     11/21/13 2315  vancomycin (VANCOCIN) 2,000 mg in sodium chloride 0.9 % 500 mL IVPB     2,000 mg 250 mL/hr over 120 Minutes Intravenous  Once 11/21/13 2314 11/22/13 0205   11/21/13 2300  piperacillin-tazobactam (ZOSYN) IVPB 3.375 g     3.375 g 100 mL/hr over 30 Minutes Intravenous  Once 11/21/13 2258 11/22/13 0004      Assessment: 43 yo female with likely HCAP on vancomycin/zosyn.  WBC= 12.9, afeb, SCr= 1.56 (up from 0.86) and CrCl ~ 50.  Vanc 7/25> Zosyn 7/25>  7/24 BCx> 7/25 RCx>  Goal of Therapy:  Vancomycin trough level 15-20 mcg/ml  Plan:  -Change vancomycin to 1500mg  IV q24hr -No zosyn changes needed -Will follow renal function, cultures and clinical progress  Hildred Laser, Pharm D 11/23/2013 7:52 AM

## 2013-11-23 NOTE — Progress Notes (Signed)
RN called RT for patient desats.  Began to desat while sleeping.  Once woken up sats came back up to 97%.  Was placed on ventilator just for sleeping purposes.  Vitals currently stable.  Patient resting comfortably.  RT will continue to monitor.

## 2013-11-24 DIAGNOSIS — N039 Chronic nephritic syndrome with unspecified morphologic changes: Secondary | ICD-10-CM

## 2013-11-24 DIAGNOSIS — D631 Anemia in chronic kidney disease: Secondary | ICD-10-CM

## 2013-11-24 DIAGNOSIS — E119 Type 2 diabetes mellitus without complications: Secondary | ICD-10-CM

## 2013-11-24 LAB — BASIC METABOLIC PANEL
Anion gap: 11 (ref 5–15)
BUN: 19 mg/dL (ref 6–23)
CO2: 32 mEq/L (ref 19–32)
Calcium: 8 mg/dL — ABNORMAL LOW (ref 8.4–10.5)
Chloride: 101 mEq/L (ref 96–112)
Creatinine, Ser: 1.41 mg/dL — ABNORMAL HIGH (ref 0.50–1.10)
GFR calc Af Amer: 52 mL/min — ABNORMAL LOW (ref >90)
GFR calc non Af Amer: 45 mL/min — ABNORMAL LOW (ref >90)
Glucose, Bld: 129 mg/dL — ABNORMAL HIGH (ref 70–99)
Potassium: 4.1 mEq/L (ref 3.7–5.3)
Sodium: 144 mEq/L (ref 137–147)

## 2013-11-24 LAB — GLUCOSE, CAPILLARY
Glucose-Capillary: 117 mg/dL — ABNORMAL HIGH (ref 70–99)
Glucose-Capillary: 139 mg/dL — ABNORMAL HIGH (ref 70–99)
Glucose-Capillary: 165 mg/dL — ABNORMAL HIGH (ref 70–99)
Glucose-Capillary: 141 mg/dL — ABNORMAL HIGH (ref 70–99)
Glucose-Capillary: 206 mg/dL — ABNORMAL HIGH (ref 70–99)
Glucose-Capillary: 239 mg/dL — ABNORMAL HIGH (ref 70–99)

## 2013-11-24 LAB — PROCALCITONIN: Procalcitonin: <0.1 ng/mL

## 2013-11-24 MED ORDER — PREDNISONE 20 MG PO TABS
40.0000 mg | ORAL_TABLET | Freq: Every day | ORAL | Status: DC
  Administered 2013-11-25 – 2013-11-26 (×2): 40 mg via ORAL
  Filled 2013-11-24 (×3): qty 2

## 2013-11-24 MED ORDER — CARVEDILOL 3.125 MG PO TABS
3.1250 mg | ORAL_TABLET | Freq: Two times a day (BID) | ORAL | Status: DC
  Administered 2013-11-24 – 2013-11-26 (×4): 3.125 mg via ORAL
  Filled 2013-11-24 (×6): qty 1

## 2013-11-24 NOTE — Progress Notes (Signed)
Patient is currently active with [facility] for chronic disease management services.  Patient has been engaged by a [employer].  Our community based plan of care has focused on disease management of CHF, medication procurement/adherence, and community resource support.  Patient is a new active patient with [facility] for RN [facility] Coordination services. Patient admits to non adherence with her low sodium diet. She has difficulty with managing her diabetes in light of her steroid therapy. Will collaborate with impatient team regarding options.  Patient will receive a post discharge transition of care call and will be evaluated for monthly home visits for assessments and disease process education. Of note, [facility] Care Management services does not replace or interfere with any services that are arranged by inpatient case management or social work.  For additional questions or referrals please contact Corliss Blacker BSN RN [facility] Liaison at [phone].

## 2013-11-24 NOTE — Progress Notes (Signed)
PULMONARY / CRITICAL CARE MEDICINE   Name: Melody Gray MRN: [id number] DOB: 03/18/1971    ADMISSION DATE:  11/21/2013 CONSULTATION DATE:  11/24/2013  REFERRING MD :  EDP  CHIEF COMPLAINT:  SOB  INITIAL PRESENTATION: Pt has a history of sarcoidosis dx in 2001 and O2 dependent since 2011. Also with h/o PAH and OHS requiring trach earlier this year. Presenting to ED with hypoxia and hypercarbic respiratory failure. Trach changed to cuffed in ED for placement on vent, significant improvement on this.  STUDIES:  7/24: Bedside ultrasound:  Small R pleural effusion.  Consolidation of RLL  SIGNIFICANT EVENTS: 7/24 - admitted to ICU with hypoxic and hypercarbic respiratory failure  CULTURES: Blood x2 7/24>>> Trach aspirate 7/24>>>  ANTIBIOTICS: Vanc 7/24>>> Zosyn 7/24>>>  LINES: Trach (chronic)>>   SUBJECTIVE: No complaints, tol ATC overnight.  Hungry.    VITAL SIGNS: Temp:  [98 F (36.7 C)-98.5 F (36.9 C)] 98 F (36.7 C) (07/27 0800) Pulse Rate:  [68-135] 84 (07/27 0811) Resp:  [14-21] 18 (07/27 0811) BP: (123-153)/(70-92) 138/70 mmHg (07/27 0800) SpO2:  [92 %-100 %] 98 % (07/27 0811) FiO2 (%):  [40 %-60 %] 40 % (07/27 0811) Weight:  [243 lb 9.7 oz (110.5 kg)] 243 lb 9.7 oz (110.5 kg) (07/27 0452)  HEMODYNAMICS:   VENTILATOR SETTINGS: Vent Mode:  [-]  FiO2 (%):  [40 %-60 %] 40 %  INTAKE / OUTPUT:  Intake/Output Summary (Last 24 hours) at 11/24/13 1026 Last data filed at 11/24/13 0700  Gross per 24 hour  Intake    890 ml  Output    575 ml  Net    315 ml    PHYSICAL EXAMINATION: General:  Obese woman, up in chair, pleasant  Neuro:  Alert and oriented, no focal deficits HEENT:  NCAT, MMM, EOMI. Trach c/d Cardiovascular:  RRR, no murmur Lungs: resps even non labored on ATC, fine rhonchi, good air movement throughout, good phonation with PMV Abdomen:  Soft, NTND Musculoskeletal: 2+ pitting edema BLE Skin:  intact  LABS:  CBC  Recent Labs Lab  11/21/13 2205 11/22/13 0229  WBC 16.6* 12.9*  HGB 13.6 12.8  HCT 45.3 42.8  PLT 279 243   Coag's No results found for this basename: APTT, INR,  in the last 168 hours BMET  Recent Labs Lab 11/22/13 0229 11/23/13 0350 11/24/13 0427  NA 144 143 144  K 3.9 4.4 4.1  CL 102 101 101  CO2 30 30 32  BUN 15 21 19   CREATININE 0.86 1.56* 1.41*  GLUCOSE 157* 125* 129*   Electrolytes  Recent Labs Lab 11/22/13 0229 11/23/13 0350 11/24/13 0427  CALCIUM 7.5* 7.7* 8.0*   Sepsis Markers  Recent Labs Lab 11/21/13 0130 11/21/13 2215 11/23/13 0350 11/24/13 0427  LATICACIDVEN  --  1.18  --   --   PROCALCITON <0.10  --  <0.10 <0.10   ABG  Recent Labs Lab 11/21/13 2238 11/22/13 0350 11/22/13 0515  PHART 7.254* 7.238* 7.280*  PCO2ART 77.3* 79.8* 70.9*  PO2ART 58.0* 70.0* 65.9*   Liver Enzymes  Recent Labs Lab 11/21/13 2205  AST 31  ALT 12  ALKPHOS 98  BILITOT 0.4  ALBUMIN 3.2*   Cardiac Enzymes  Recent Labs Lab 11/21/13 2205  PROBNP 4425.0*   Glucose  Recent Labs Lab 11/23/13 1117 11/23/13 1617 11/23/13 1939 11/24/13 11/24/13 0450 11/24/13 0801  GLUCAP 141* 135* 115* 165* 117* 139*    Imaging No new CXR 7/27  ASSESSMENT / PLAN:  PULMONARY A: Acute  hypercarbic respiratory failure, hypoxia Sarcoidosis - on chronic steroids Trach dependent OHS/OSA Likely PNA CHF P:   Cont ATC as tolerated  PMV  pulm hygiene  PRN BD Change stress steroids to PO pred and taper (baseline 10mg ./day)   CARDIOVASCULAR A: HFpEF PAH HTN CAD CHF by CXR P:  D/c stress steroids  Holding home amlodipine, irbesartan  Resume home coreg 7/27 and d/c IV metoprolol  Continue gentle iureses as tolerated   RENAL A:  No acute issues Risk for AKI Chronic hypervolemia - probably near baseline. Creat up after single dose lasix 7/25 P:   Monitor BMET intermittently Monitor I/Os Correct electrolytes as indicated IV Lasix, watching BMT  GASTROINTESTINAL A:   GERD P:   SUP: PPI Resume heart healthy diet   HEMATOLOGIC A:  No acute issues P:  DVT px: SQ heparin Monitor CBC intermittently Transfuse per usual ICU guidelines  INFECTIOUS A:  Likely HCAP P:   Broad spectrum coverage, vanc/zosyn Follow blood and sputum cultures Consider narrow abx to po in next day or so   ENDOCRINE A:  DM   P:   Hold home metformin, levemir CBG/SSI q4  NEUROLOGIC A: No acute issues P:   Monitor OOB as tol    If cont to tol ATC and transition back to some po home meds, may be ready for d/c in next 24-48 hours.   Nickolas Madrid, NP 11/24/2013  10:32 AM Pager: [phone] or [phone]  *Care during the described time interval was provided by me and/or other providers on the critical care team. I have reviewed this patient's available data, including medical history, events of note, physical examination and test results as part of my evaluation.  Attending:  I have seen and examined the patient with nurse practitioner/resident and agree with the note above.   Doing well Advance diet Hopeful d/c this week  Roselie Awkward, MD [place] Pager: [phone] Cell: [phone] If no response, call [phone]

## 2013-11-24 NOTE — Care Management Note (Addendum)
    Page 1 of 1   11/26/2013     10:34:44 AM CARE MANAGEMENT NOTE 11/26/2013  Patient:  Melody Gray, Melody Gray   Account Number:  [id number]  Date Initiated:  11/24/2013  Documentation initiated by:  Raynard Mapps  Subjective/Objective Assessment:   dx resp failure; lives with children, chronic trach, has home O2 through [employer], active with home health services [facility] [place]  CM consult      [place] arranged  HH-1 RN      [place].   Status of service:  Completed, signed off Medicare Important Message given?  YES (If response is "NO", the following Medicare IM given date fields will be blank) Date Medicare IM given:  11/24/2013 Medicare IM given by:  Boyce Keltner Date Additional Medicare IM given:  11/26/2013 Additional Medicare IM given by:  Babette Relic  Discharge Disposition:  [place]  Per UR Regulation:  Reviewed for med. necessity/level of care/duration of stay

## 2013-11-24 NOTE — Progress Notes (Deleted)
Patient is a new active patient with [facility] for RN [facility] Coordination services.  Patient admits to non adherence with her low sodium diet.  She has difficulty with managing her diabetes in light of her steroid therapy.  Will collaborate with nutritional services regarding options.

## 2013-11-24 NOTE — Clinical Documentation Improvement (Signed)
   Documentation of Congestive Heart failure/Pulmonary edema treated with IV Lasix 20 mg daily and BNP 4425. Please document the type and severity of CHF if appropriate.  Thank you   Possible Clinical Conditions?   Acute Systolic Congestive Heart Failure Acute Diastolic Congestive Heart Failure Acute Systolic & Diastolic Congestive Heart Failure Acute on Chronic Systolic Congestive Heart Failure Acute on Chronic Diastolic Congestive Heart Failure Acute on Chronic Systolic & Diastolic Congestive Heart Failure Other Condition Cannot Clinically Determine    Risk Factors: Chronic resp failure, HTN, DM 2, Obesity  Diagnostics:ProBNP lab 4425.Marland KitchenMarland KitchenChest Xray: . Cardiomegaly with findings consistent with moderate to severe  pulmonary edema.  Treatment: IV Lasix 20 mg daily  Thank You,. Ree Kida ,RN Clinical Documentation Specialist:  [place] Information Management

## 2013-11-25 ENCOUNTER — Inpatient Hospital Stay (HOSPITAL_COMMUNITY): Payer: Medicare Other

## 2013-11-25 LAB — BASIC METABOLIC PANEL
Anion gap: 10 (ref 5–15)
BUN: 19 mg/dL (ref 6–23)
CO2: 32 mEq/L (ref 19–32)
Calcium: 8.2 mg/dL — ABNORMAL LOW (ref 8.4–10.5)
Chloride: 100 mEq/L (ref 96–112)
Creatinine, Ser: 1.45 mg/dL — ABNORMAL HIGH (ref 0.50–1.10)
GFR calc Af Amer: 50 mL/min — ABNORMAL LOW (ref >90)
GFR calc non Af Amer: 43 mL/min — ABNORMAL LOW (ref >90)
Glucose, Bld: 142 mg/dL — ABNORMAL HIGH (ref 70–99)
Potassium: 3.5 mEq/L — ABNORMAL LOW (ref 3.7–5.3)
Sodium: 142 mEq/L (ref 137–147)

## 2013-11-25 LAB — CBC
HCT: 39.4 % (ref 36.0–46.0)
Hemoglobin: 11.7 g/dL — ABNORMAL LOW (ref 12.0–15.0)
MCH: 28.1 pg (ref 26.0–34.0)
MCHC: 29.7 g/dL — ABNORMAL LOW (ref 30.0–36.0)
MCV: 94.5 fL (ref 78.0–100.0)
Platelets: 198 10*3/uL (ref 150–400)
RBC: 4.17 MIL/uL (ref 3.87–5.11)
RDW: 15.4 % (ref 11.5–15.5)
WBC: 7.1 10*3/uL (ref 4.0–10.5)

## 2013-11-25 LAB — GLUCOSE, CAPILLARY
Glucose-Capillary: 131 mg/dL — ABNORMAL HIGH (ref 70–99)
Glucose-Capillary: 141 mg/dL — ABNORMAL HIGH (ref 70–99)
Glucose-Capillary: 143 mg/dL — ABNORMAL HIGH (ref 70–99)
Glucose-Capillary: 217 mg/dL — ABNORMAL HIGH (ref 70–99)
Glucose-Capillary: 313 mg/dL — ABNORMAL HIGH (ref 70–99)
Glucose-Capillary: 325 mg/dL — ABNORMAL HIGH (ref 70–99)

## 2013-11-25 LAB — CULTURE, RESPIRATORY W GRAM STAIN

## 2013-11-25 LAB — CULTURE, RESPIRATORY

## 2013-11-25 MED ORDER — INSULIN DETEMIR 100 UNIT/ML ~~LOC~~ SOLN
20.0000 [IU] | Freq: Every day | SUBCUTANEOUS | Status: DC
  Administered 2013-11-25: 20 [IU] via SUBCUTANEOUS
  Filled 2013-11-25 (×2): qty 0.2

## 2013-11-25 MED ORDER — AMLODIPINE BESYLATE 5 MG PO TABS
5.0000 mg | ORAL_TABLET | Freq: Every day | ORAL | Status: DC
  Administered 2013-11-25 – 2013-11-26 (×2): 5 mg via ORAL
  Filled 2013-11-25 (×2): qty 1

## 2013-11-25 MED ORDER — LEVOFLOXACIN 750 MG PO TABS
750.0000 mg | ORAL_TABLET | ORAL | Status: DC
  Administered 2013-11-25: 750 mg via ORAL
  Filled 2013-11-25 (×2): qty 1

## 2013-11-25 MED ORDER — INSULIN ASPART 100 UNIT/ML ~~LOC~~ SOLN
0.0000 [IU] | Freq: Three times a day (TID) | SUBCUTANEOUS | Status: DC
  Administered 2013-11-25: 5 [IU] via SUBCUTANEOUS
  Administered 2013-11-25: 11 [IU] via SUBCUTANEOUS
  Administered 2013-11-26: 3 [IU] via SUBCUTANEOUS
  Administered 2013-11-26: 5 [IU] via SUBCUTANEOUS

## 2013-11-25 MED ORDER — POTASSIUM CHLORIDE CRYS ER 20 MEQ PO TBCR
40.0000 meq | EXTENDED_RELEASE_TABLET | Freq: Once | ORAL | Status: AC
  Administered 2013-11-25: 40 meq via ORAL
  Filled 2013-11-25: qty 2

## 2013-11-25 MED ORDER — INSULIN DETEMIR 100 UNIT/ML ~~LOC~~ SOLN
26.0000 [IU] | Freq: Every day | SUBCUTANEOUS | Status: DC
  Administered 2013-11-26: 26 [IU] via SUBCUTANEOUS
  Filled 2013-11-25: qty 0.26

## 2013-11-25 NOTE — Progress Notes (Signed)
PULMONARY / CRITICAL CARE MEDICINE   Name: Melody Gray MRN: [id number] DOB: [birth year]    ADMISSION DATE:  11/21/2013 CONSULTATION DATE:  11/25/2013  REFERRING MD :  EDP  CHIEF COMPLAINT:  SOB  INITIAL PRESENTATION: Pt has a history of sarcoidosis dx in 2001 and O2 dependent since 2011. Also with h/o PAH and OHS requiring trach earlier this year. Presenting to ED with hypoxia and hypercarbic respiratory failure. Trach changed to cuffed in ED for placement on vent, significant improvement on this.  STUDIES:  7/24: Bedside ultrasound:  Small R pleural effusion.  Consolidation of RLL  SIGNIFICANT EVENTS: 7/24 - admitted to ICU with hypoxic and hypercarbic respiratory failure  CULTURES: Blood x2 7/24>>> Trach aspirate 7/24> Few Staph Aureus - Pan sensitive.   ANTIBIOTICS: Vanc 7/24>7/28 Zosyn 7/24>7/28 Levofloxacin 7/28 >>>  LINES: Trach (chronic) >>  INTERVAL HISTORY: Tolerating trach collar without difficulty and has been using PMV frequently last 24 hours. States that since she has resumed eating, she does not cough or choke while swallowing. O2 sats when healthy are reportedly 88-92%. Has been hypertensive this morning with SBP consistently 150.  VITAL SIGNS: Temp:  [97.6 F (36.4 C)-98.6 F (37 C)] 98.2 F (36.8 C) (07/28 0800) Pulse Rate:  [70-108] 100 (07/28 0903) Resp:  [15-30] 22 (07/28 0903) BP: (113-151)/(62-104) 151/104 mmHg (07/28 0903) SpO2:  [91 %-100 %] 93 % (07/28 0903) FiO2 (%):  [28 %-40 %] 35 % (07/28 0800) Weight:  [111.6 kg (246 lb 0.5 oz)] 111.6 kg (246 lb 0.5 oz) (07/28 0448)  HEMODYNAMICS:   VENTILATOR SETTINGS: Vent Mode:  [-]  FiO2 (%):  [28 %-40 %] 35 %  INTAKE / OUTPUT:  Intake/Output Summary (Last 24 hours) at 11/25/13 1037 Last data filed at 11/25/13 0516  Gross per 24 hour  Intake   1170 ml  Output      0 ml  Net   1170 ml    PHYSICAL EXAMINATION: General:  Obese woman, up in chair, pleasant  Neuro:  Alert and oriented, no  focal deficits HEENT:  NCAT, MMM, EOMI. Trach c/d Cardiovascular:  RRR, no murmur Lungs: resps even non labored on ATC, fine rhonchi, good air movement throughout Abdomen:  Soft, NTND Musculoskeletal: 2+ pitting edema BLE Skin:  Intact  LABS:  CBC  Recent Labs Lab 11/21/13 2205 11/22/13 0229 11/25/13 0309  WBC 16.6* 12.9* 7.1  HGB 13.6 12.8 11.7*  HCT 45.3 42.8 39.4  PLT 279 243 198   Coag's No results found for this basename: APTT, INR,  in the last 168 hours BMET  Recent Labs Lab 11/23/13 0350 11/24/13 0427 11/25/13 0309  NA 143 144 142  K 4.4 4.1 3.5*  CL 101 101 100  CO2 30 32 32  BUN 21 19 19   CREATININE 1.56* 1.41* 1.45*  GLUCOSE 125* 129* 142*   Electrolytes  Recent Labs Lab 11/23/13 0350 11/24/13 0427 11/25/13 0309  CALCIUM 7.7* 8.0* 8.2*   Sepsis Markers  Recent Labs Lab 11/21/13 0130 11/21/13 2215 11/23/13 0350 11/24/13 0427  LATICACIDVEN  --  1.18  --   --   PROCALCITON <0.10  --  <0.10 <0.10   ABG  Recent Labs Lab 11/21/13 2238 11/22/13 0350 11/22/13 0515  PHART 7.254* 7.238* 7.280*  PCO2ART 77.3* 79.8* 70.9*  PO2ART 58.0* 70.0* 65.9*   Liver Enzymes  Recent Labs Lab 11/21/13 2205  AST 31  ALT 12  ALKPHOS 98  BILITOT 0.4  ALBUMIN 3.2*   Cardiac Enzymes  Recent  Labs Lab 11/21/13 2205  PROBNP 4425.0*   Glucose  Recent Labs Lab 11/24/13 1207 11/24/13 1659 11/24/13 2007 11/25/13 0035 11/25/13 0444 11/25/13 0829  GLUCAP 141* 206* 239* 143* 141* 131*    Imaging 7/28 Improving pulmonary edema. Persistent bibasilar opacities.   ASSESSMENT / PLAN:  PULMONARY A: Acute hypercarbic respiratory failure, hypoxia Sarcoidosis - on chronic steroids Trach dependent OHS/OSA Likely PNA CHF P:   Cont ATC as tolerated  PMV  pulm hygiene  PRN BD Prednisone taper ->to 40mg  7/28. (Baseline 10mg )  CARDIOVASCULAR A: HFpEF PAH HTN CAD CHF by CXR P:  Holding home irbesartan in setting of elevated  creatinine Resume home amlodipine Continue coreg at preadmission dose Continue gentle diureses as tolerated  RENAL A:  No acute issues Risk for AKI Chronic hypervolemia - probably near baseline. Creat up after single dose lasix 7/25 P:   Monitor BMET intermittently Monitor I/Os Correct electrolytes as indicated (Sup K 70meq 7/28) IV Lasix, watching BMT  GASTROINTESTINAL A:  GERD P:   SUP: PPI Heart healthy diet   HEMATOLOGIC A:  No acute issues P:  DVT px: SQ heparin Monitor CBC intermittently Transfuse per usual ICU guidelines  INFECTIOUS A:  Likely HCAP > Sputum cultures MSSA 7/28 P:   Narrow ABX to PO Levaquin for 7 total ABX days. Day #4 Follow blood cx  ENDOCRINE A:  DM   P:   Hold preadmission metformin Resume home Levemir CBG/SSI to TID [facility]  NEUROLOGIC A: No acute issues P:   Monitor OOB as tol    Georgann Housekeeper, ACNP [place] [facility]/Critical [facility] Pager [phone] or [phone]   Attending:  I have seen and examined the patient with nurse practitioner/resident and agree with the note above.   Doing well Will downsize to cuffless trach tomorrow and likely d/c home  Roselie Awkward, MD [place] [facility] Pager: [phone] Cell: [phone] If no response, call [phone]

## 2013-11-26 DIAGNOSIS — R05 Cough: Secondary | ICD-10-CM

## 2013-11-26 DIAGNOSIS — J15211 Pneumonia due to Methicillin susceptible Staphylococcus aureus: Secondary | ICD-10-CM

## 2013-11-26 DIAGNOSIS — R059 Cough, unspecified: Secondary | ICD-10-CM

## 2013-11-26 LAB — BASIC METABOLIC PANEL
Anion gap: 13 (ref 5–15)
BUN: 20 mg/dL (ref 6–23)
CO2: 31 mEq/L (ref 19–32)
Calcium: 9.2 mg/dL (ref 8.4–10.5)
Chloride: 98 mEq/L (ref 96–112)
Creatinine, Ser: 1.45 mg/dL — ABNORMAL HIGH (ref 0.50–1.10)
GFR calc Af Amer: 50 mL/min — ABNORMAL LOW (ref >90)
GFR calc non Af Amer: 43 mL/min — ABNORMAL LOW (ref >90)
Glucose, Bld: 146 mg/dL — ABNORMAL HIGH (ref 70–99)
Potassium: 4.4 mEq/L (ref 3.7–5.3)
Sodium: 142 mEq/L (ref 137–147)

## 2013-11-26 LAB — GLUCOSE, CAPILLARY
Glucose-Capillary: 164 mg/dL — ABNORMAL HIGH (ref 70–99)
Glucose-Capillary: 161 mg/dL — ABNORMAL HIGH (ref 70–99)
Glucose-Capillary: 241 mg/dL — ABNORMAL HIGH (ref 70–99)

## 2013-11-26 MED ORDER — LEVOFLOXACIN 750 MG PO TABS: 750.0000 mg | ORAL_TABLET | Freq: Every day | ORAL | Status: DC

## 2013-11-26 MED ORDER — FUROSEMIDE 40 MG PO TABS: 20.0000 mg | ORAL_TABLET | Freq: Every day | ORAL | Status: DC

## 2013-11-26 MED ORDER — ALBUTEROL SULFATE HFA 108 (90 BASE) MCG/ACT IN AERS: 2.0000 | INHALATION_SPRAY | Freq: Four times a day (QID) | RESPIRATORY_TRACT | Status: DC | PRN

## 2013-11-26 MED ORDER — PREDNISONE 10 MG PO TABS: ORAL_TABLET | ORAL | Status: DC

## 2013-11-26 MED ORDER — MELATONIN 3 MG PO TABS: 3.0000 mg | ORAL_TABLET | Freq: Every evening | ORAL | Status: DC | PRN

## 2013-11-26 NOTE — Progress Notes (Addendum)
Pt discharged home. Pt transferred via wheelchair to car. Pt on oxygen trach collar when leaving. Pt stable. Teaching completed, discharge paperwork given to patient.  Aaleah Hirsch M. Dalbert Batman, RN, BSN 11/26/2013 3:02 PM

## 2013-11-26 NOTE — Progress Notes (Signed)
RT assisted NP P. Babcock with trach change. Original #6 SH cuffed changed to #6 SH Cuffless. Uneventful, resulted in positive end tidal, equal breath sounds, Sp02 on 35% ATC 96%. RN aware.

## 2013-11-26 NOTE — Discharge Summary (Signed)
Physician Discharge Summary  Patient ID: Melody Gray MRN: [id number] DOB/AGE: Jun 13, 1970 43 y.o.  Admit date: 11/21/2013 Discharge date: 11/26/2013    Discharge Diagnoses:  Active Problems:   Sarcoidosis   Diabetes mellitus type II, controlled   HYPERTENSION   Tracheostomy status   Respiratory failure, acute and chronic   PNA (pneumonia)                                                                   D/c plan by Discharge Diagnosis  Acute on chronic respiratory failure - trach dependent  OHS/OSA  Sarcoidosis  Presumed PNA - Sputum cultures MSSA 7/28 D/c plan --  -Prednisone taper back to baseline 4m/day  -PRN albuterol  -ATC with PMV as tol  -outpt pulm and ENT f/u  -O2 as previous -levaquin to complete 8 days abx  -Gentle diuresis (see below)   HTN  CAD CHF  PAH  D/c plan --  -Cont po lasix at reduced dose (231mday) with elevated SCr - hopefully back to previous 4066may once renal function back to baseline  -cont home coreg, amlodipine  -cont hold home ARB -- EVAL AS OUTPT  -cont lipitor  -outpt cards f/u   Acute renal insufficiency - in setting diuresis D/c plan --  - PCP f/u  -NEEDS F/U BMET  -hold home ARB and metformin  -cont reduced dose lasix   DM  D/c plan --  -Hold preadmission metformin  -Resume home Levemir, SSI  -Carb mod diet     Brief hospital Summary: Melody Gray a [age] y/o female with a PMH of OHS, sarcoidosis dx 2001, O2 dep since 2011 now with chronic trach.  She presented 7/24 with several days of congestion, hypoxia and progressive respiratory distress.  In ER she was changed to cuffed trach and placed on vent.  She was admitted with acute on chronic mixed respiratory failure presumed r/t HCAP and volume overload +/- flare sarcoid.  She was treated with vent support, IV abx as below, IV steroids, gentle diuresis and BD's.  Course was c/b mild acute on CKD which is improving slowly with decreased lasix dose, ARB/metformin  on hold. She improved quickly and has not required vent support since 7/27.  She has been transitioned back to PO steroids with taper in place, PO abx and po lasix.  She is tolerating PO diet as prior to admission and is ambulating well on ATC.  She is ready for d/c home pending trach change back to #6 cuffless.  She will f/u in pulmonary office as well as with ENT and continue her previous home health support (PT, RN).    STUDIES:  7/24: Bedside ultrasound: Small R pleural effusion. Consolidation of RLL   SIGNIFICANT EVENTS:  7/24 - admitted to ICU with hypoxic and hypercarbic respiratory failure   CULTURES:  Blood x2 7/24>>>  Trach aspirate 7/24> Few Staph Aureus - Pan sensitive.   ANTIBIOTICS:  Vanc 7/24>7/28  Zosyn 7/24>7/28  Levofloxacin 7/28 >>> planned 7/31   LINES:  Trach (chronic) >>    Filed Vitals:   11/26/13 0500 11/26/13 0800 11/26/13 0824 11/26/13 1020  BP:  163/94  172/90  Pulse:  101    Temp:   98.1 F (36.7 C)  TempSrc:   Oral   Resp:      Height:      Weight: 244 lb 14.9 oz (111.1 kg)     SpO2:  96%       Discharge Labs  BMET  Recent Labs Lab 11/22/13 0229 11/23/13 0350 11/24/13 0427 11/25/13 0309 11/26/13 0332  NA 144 143 144 142 142  K 3.9 4.4 4.1 3.5* 4.4  CL 102 101 101 100 98  CO2 30 30 32 32 31  GLUCOSE 157* 125* 129* 142* 146*  BUN '15 21 19 19 20  ' CREATININE 0.86 1.56* 1.41* 1.45* 1.45*  CALCIUM 7.5* 7.7* 8.0* 8.2* 9.2     CBC   Recent Labs Lab 11/21/13 2205 11/22/13 0229 11/25/13 0309  HGB 13.6 12.8 11.7*  HCT 45.3 42.8 39.4  WBC 16.6* 12.9* 7.1  PLT 279 243 198   Anti-Coagulation No results found for this basename: INR,  in the last 168 hours         Follow-up Information   Follow up with PARRETT,TAMMY, NP On 12/02/2013. (4:15pm )    Specialty:  Nurse Practitioner   Contact information:   [place] [address] [phone]       Follow up with Jerene Pitch, MD. Schedule an  appointment as soon as possible for a visit in 2 weeks.   Specialty:  Internal Medicine   Contact information:   [place] [address] [phone]       Follow up with Melissa Montane, MD.   Specialty:  Otolaryngology   Contact information:   [address] [place] [address] [place] [address] [phone]       Follow up with Kirk Ruths, MD. Schedule an appointment as soon as possible for a visit in 2 weeks.   Specialty:  Cardiology   Contact information:   [address] [place] [address] [phone]         Medication List    STOP taking these medications       irbesartan 150 MG tablet  Commonly known as:  AVAPRO     metFORMIN 1000 MG tablet  Commonly known as:  GLUCOPHAGE     valsartan 320 MG tablet  Commonly known as:  DIOVAN      TAKE these medications       albuterol 108 (90 BASE) MCG/ACT inhaler  Commonly known as:  PROVENTIL HFA;VENTOLIN HFA  Inhale 2 puffs into the lungs every 6 (six) hours as needed for wheezing or shortness of breath.     amLODipine 5 MG tablet  Commonly known as:  NORVASC  Take 1 tablet (5 mg total) by mouth daily.     atorvastatin 20 MG tablet  Commonly known as:  LIPITOR  Take 1 tablet (20 mg total) by mouth daily at 6 PM.     carvedilol 3.125 MG tablet  Commonly known as:  COREG  Take 1 tablet (3.125 mg total) by mouth 2 (two) times daily with a meal.     famotidine 20 MG tablet  Commonly known as:  PEPCID  One at bedtime     ferrous sulfate 325 (65 FE) MG tablet  Take 1 tablet (325 mg total) by mouth 3 (three) times daily with meals.     furosemide 40 MG tablet  Commonly known as:  LASIX  Take 0.5 tablets (20 mg total) by mouth daily.     insulin aspart 100 UNIT/ML injection  Commonly known as:  novoLOG  Inject 2-8 Units into the skin 3 (three) times daily. Sliding scale     insulin detemir 100 UNIT/ML injection  Commonly known as:  LEVEMIR  Inject 20-26 Units into the skin at  bedtime. 26 units every morning and 20 units every night     levofloxacin 750 MG tablet  Commonly known as:  LEVAQUIN  Take 1 tablet (750 mg total) by mouth daily.     Melatonin 3 MG Tabs  Take 1 tablet (3 mg total) by mouth at bedtime as needed.     MUCINEX DM MAXIMUM STRENGTH 60-1200 MG Tb12  One every 12 hours as needed for cough     omeprazole 20 MG capsule  Commonly known as:  PRILOSEC  Take 20 mg by mouth daily.     predniSONE 10 MG tablet  Commonly known as:  DELTASONE  take 1 tablet by mouth once daily WITH BREAKFAST     predniSONE 10 MG tablet  Commonly known as:  DELTASONE  Take 4 tabs PO daily x 3 days, then 3 tabs PO daily x 3 days, then 2 tabs PO daily x 3 days, then resume 1 tab PO daily maintenance          Disposition: 01-Home or Self Care  Discharged Condition: Melody Gray has met maximum benefit of inpatient care and is medically stable and cleared for discharge.  Patient is pending follow up as above.      Time spent on disposition:  Greater than 35 minutes.   SignedDarlina Sicilian, NP 11/26/2013  11:26 AM Pager: [phone] or [phone]  *Care during the described time interval was provided by me and/or other providers on the critical care team. I have reviewed this patient's available data, including medical history, events of note, physical examination and test results as part of my evaluation.      Attending:  Roselie Awkward, MD [facility] Pager: [phone] Cell: [phone] If no response, call [phone]

## 2013-11-26 NOTE — Progress Notes (Signed)
Inpatient Diabetes Program Recommendations  AACE/ADA: New Consensus Statement on Inpatient Glycemic Control (2013)  Target Ranges:  Prepandial:   less than 140 mg/dL      Peak postprandial:   less than 180 mg/dL (1-2 hours)      Critically ill patients:  140 - 180 mg/dL  Results for Melody Gray, Melody Gray (MRN [id number]) as of 11/26/2013 10:39  Ref. Range 11/25/2013 11:19 11/25/2013 16:20 11/25/2013 20:40 11/26/2013 04:29 11/26/2013 08:21  Glucose-Capillary Latest Range: 70-99 mg/dL 217 (H) 325 (H) 313 (H) 164 (H) 161 (H)   Inpatient Diabetes Program Recommendations Insulin - Meal Coverage: consider adding Novolog 4 units TID with meals for elevated postprandial CBGs Thank you  Raoul Pitch BSN, RN,CDE Inpatient Diabetes Coordinator [phone] (team pager)

## 2013-11-26 NOTE — Procedures (Signed)
   Tracheostomy tube change: Informed verbal consent was obtained after explaining the risks (including bleeding and infection), benefits and alternatives of the procedure. Verbal timeout was performed prior to the procedure. The old  # 6 cuffed trach was carefully removed. the tracheostomy site appeared: tight, but otherwise unremarkable. A new # 6 Cuffless trach was easily placed in the tracheostomy stoma and secured with velcro trach ties. The tracheostomy was patent, good color change observed via EZ-CAP, and the patient was easily able to voice with finger occlusion and tolerated the procedure well with no immediate complications.   Plan Needs f/u in either [facility] trach clinic or w/ Dr Janace Hoard in 4-6 weeks. If pt prefers this could easily be managed at the [facility] trach clinic for routine trach change.    [name] ACNP-BC [place] Pager # [phone] OR # [phone] if no answer   Roselie Awkward, MD [place] [facility] Pager: [phone] Cell: [phone] If no response, call [phone]

## 2013-11-27 DIAGNOSIS — Z430 Encounter for attention to tracheostomy: Secondary | ICD-10-CM | POA: Diagnosis not present

## 2013-11-27 DIAGNOSIS — E119 Type 2 diabetes mellitus without complications: Secondary | ICD-10-CM | POA: Diagnosis not present

## 2013-11-27 DIAGNOSIS — I5032 Chronic diastolic (congestive) heart failure: Secondary | ICD-10-CM | POA: Diagnosis not present

## 2013-11-27 DIAGNOSIS — J9620 Acute and chronic respiratory failure, unspecified whether with hypoxia or hypercapnia: Secondary | ICD-10-CM | POA: Diagnosis not present

## 2013-11-27 DIAGNOSIS — Z9981 Dependence on supplemental oxygen: Secondary | ICD-10-CM | POA: Diagnosis not present

## 2013-11-27 DIAGNOSIS — I509 Heart failure, unspecified: Secondary | ICD-10-CM | POA: Diagnosis not present

## 2013-11-28 LAB — CULTURE, BLOOD (ROUTINE X 2)
Culture: NO GROWTH
Culture: NO GROWTH

## 2013-11-29 DIAGNOSIS — Z430 Encounter for attention to tracheostomy: Secondary | ICD-10-CM | POA: Diagnosis not present

## 2013-11-29 DIAGNOSIS — J9620 Acute and chronic respiratory failure, unspecified whether with hypoxia or hypercapnia: Secondary | ICD-10-CM | POA: Diagnosis not present

## 2013-11-29 DIAGNOSIS — I509 Heart failure, unspecified: Secondary | ICD-10-CM | POA: Diagnosis not present

## 2013-11-29 DIAGNOSIS — E119 Type 2 diabetes mellitus without complications: Secondary | ICD-10-CM | POA: Diagnosis not present

## 2013-11-29 DIAGNOSIS — I5032 Chronic diastolic (congestive) heart failure: Secondary | ICD-10-CM | POA: Diagnosis not present

## 2013-11-29 DIAGNOSIS — Z9981 Dependence on supplemental oxygen: Secondary | ICD-10-CM | POA: Diagnosis not present

## 2013-11-30 ENCOUNTER — Encounter: Payer: Self-pay | Admitting: Internal Medicine

## 2013-12-01 ENCOUNTER — Other Ambulatory Visit: Payer: Self-pay | Admitting: *Deleted

## 2013-12-01 MED ORDER — INSULIN ASPART 100 UNIT/ML ~~LOC~~ SOLN: SUBCUTANEOUS | Status: DC

## 2013-12-01 MED ORDER — INSULIN DETEMIR 100 UNIT/ML ~~LOC~~ SOLN
20.0000 [IU] | Freq: Every day | SUBCUTANEOUS | Status: DC
Start: 2013-12-01 — End: 2014-01-29

## 2013-12-02 ENCOUNTER — Encounter: Payer: Self-pay | Admitting: Internal Medicine

## 2013-12-02 ENCOUNTER — Encounter: Payer: Self-pay | Admitting: Adult Health

## 2013-12-02 ENCOUNTER — Other Ambulatory Visit: Payer: Self-pay | Admitting: *Deleted

## 2013-12-02 ENCOUNTER — Ambulatory Visit (INDEPENDENT_AMBULATORY_CARE_PROVIDER_SITE_OTHER): Payer: Medicare Other | Admitting: Adult Health

## 2013-12-02 VITALS — BP 120/68 | HR 102 | Temp 98.2°F | Ht 61.0 in | Wt 245.6 lb

## 2013-12-02 DIAGNOSIS — Z930 Tracheostomy status: Secondary | ICD-10-CM | POA: Diagnosis not present

## 2013-12-02 DIAGNOSIS — D869 Sarcoidosis, unspecified: Secondary | ICD-10-CM

## 2013-12-02 DIAGNOSIS — R490 Dysphonia: Secondary | ICD-10-CM | POA: Diagnosis not present

## 2013-12-02 MED ORDER — CARVEDILOL 3.125 MG PO TABS: 3.1250 mg | ORAL_TABLET | Freq: Two times a day (BID) | ORAL | Status: DC

## 2013-12-02 NOTE — Patient Instructions (Signed)
Taper prednisone to 10mg  daily  Continue on Oxygen nasal cannula daytime at 3l/m and trach collar with trach unplugged at 2l/m  Mucinex DM Twice daily  As needed  Cough/congestion  Follow up in  2 weeks with all meds as planned and chest xray .

## 2013-12-02 NOTE — Assessment & Plan Note (Addendum)
Flare with +/- PNA /CHF>improving  Pt was unable to have cxr today d/t transportation will have on return ov in 2 weeks   Plan  Taper prednisone to 10mg  daily  Continue on Oxygen nasal cannula daytime at 3l/m and trach collar with trach unplugged at 2l/m  Mucinex DM Twice daily  As needed  Cough/congestion  Follow up in  2 weeks with all meds as planned and chest xray .

## 2013-12-02 NOTE — Telephone Encounter (Signed)
Called to pharm 

## 2013-12-02 NOTE — Progress Notes (Signed)
Subjective:     Patient ID: Melody Gray, female   DOB: [birth year]   MRN: [id number]   Brief patient profile:  38 yobf  Quit smoking 07/2013  dx of sarcoidosis in 2001 = sob, cough became 02 dep in July [id number] complicated by [facility] and morbid obesity with OHS requiring trach 07/2013    History of Present Illness   January 06, 2010 ov  c/o increased SOB. Pt states outpt clinic advised her to follow up with Dr. Melvyn Novas for Sacoidosis. doe on 02 at 2 lpm sitting then 4 with activity gets off at the curb struggles with grocery store. Try taking Prednisone one half even days in am with breakfast  Be sure you take omeprazole Take one 30-60 min before first and last meals of the day  Stop nifedipine  Start Cardizem 240 mg once daily  Wear 24 hours per day, 2 lpm at rest and sleeping, 4lpm with activity, this is the best way to help your heart function better  CT Chest ( repeated with contrast)> no PE, just sarcoid changes   February 09, 2010 Followup sarcoid w/ PFTs. Breathing is the same- no better or worse.on 02 2 lpm w/in and then outside using 4lpm> cc doe x 50 ft still has to stop every aisle at [employer] but not [facility] parking. rec Start Dulera 2 puffs first thing in am and 2 puffs again in pm about 12 hours later and prednisone 10 mg every other day.        07/24/2013 post hosp f/u ov/Wert re: ? Sarcoid flare/ still smoking  Chief Complaint  Patient presents with  . HFU    Pt states that her breathing is no better since hospital d/c. She reports having to use her o2 all of the time. She has minimal non prod cough.   onset was not  abrupt, sob developed gradually  in setting of gen chest/ upper abd tight sensation anteriorly, sym bilaterally better lying down, not much cough all, no better back on prednisone rec Please see patient coordinator before you leave today  to schedule CTa chest> ? LL PE vs artifact > bialteral venous dopplers > neg / D Dimer nl  Treatment for your abdominal tightness   consists of avoiding foods that cause gas (especially beans and raw vegetables like spinach and salads boiled eggs)  and citrucel 1 heaping tsp twice daily with a large glass of water.  Pain should improve w/in 2 weeks and if not then consider further GI work up.   Wear 02 4lpm 24/7 and stop all smoking  Prednisone 20 mg one daily x 3 days, half daily x 3 days and stop   07/31/2013 f/u ov/Wert re: last pred one day  prior to [place]  Patient presents with  . Follow-up    Pt reports her breathing is starting to improve. Still has minimal non prod cough. No new co's today.  did not start citrucel as rec and main c/o is abd tightness s change in bowel habits rec Wear 02 4lpm 24/7 and stop all smoking      Admit date: 08/08/2013  Discharge date: 08/15/2013  Discharge Diagnoses:  Acute on Chronic Hypercarbic Respiratory Failure  Sarcoid  OSA/OHS  Emergent Tracheostomy  Tobacco Abuse  Long standing non adherence to medical advice , see clinic records  Pulmonary Hypertension  Acute on Chronic dCHF  Hypotension  Acute on Chronic Renal Failure  Dysphagia  Hyperkalemia  Anemia  DM  Acute Encephalopathy  DISCHARGE PLAN BY DIAGNOSIS  Acute on Chronic Hypercarbic Respiratory Failure  Sarcoid  OSA/OHS  Emergent Tracheostomy  Tobacco Abuse  Long standing non adherence to medical advice , see clinic records  Pulmonary Hypertension  Discharge Plan:  -daytime ATC 40% as tolerated, PSV QHS  -prednisone taper - with wean to off over 6 weeks  -smoking cessaton  -Unfortunately expect some vocal cord/ thyroid cartilage damage here per ENT -long term effects on speech unclear, expect will need trach for a long time  -Speech following for PMV (not tol well)  -In regard to [place], she has scarring on her CXR. Further, before the trach she had OSA. All of these are good secondary causes of pulmonary hypertension and it's really not clear from the literature when patients with PH from  sarcoid benefit from pulmonary vasodilators. Considering what she has been through recently I would prefer to treat the bronchospasm vs "sarcoid flare" (less likely) with a long course of steroids. If not returned to baseline functional status after steroids would set her up for a RHC.  -WOULD NOT DECANNULATE or DOWNSIZE at this time.     Acute on Chronic dCHF  Hypotension  Discharge Plan:  -continue coreg   Acute on Chronic Renal Failure  Discharge Plan:  -trend BMP  Dysphagia  Discharge Plan:  -cleared by SLP for thin liquid, regular diet with chin tuck  -initiate heart healthy, carb modified diet  -PPI  -stop TF 08/18/2022 as pt has passed MBS with SLP, if PO intake is not sufficient, may need to consider supplementation with TF  Hyperkalemia  Discharge Plan:  -resolved, no further interventions  Anemia  Discharge Plan:  -minimize lab draws as able  -transfuse if Hgb <7%, active bleeding or MI <8%  DM  Discharge Plan:  -continue SSI + lantus  -hold home medications: Metformin, 70/30  -consider restart of home meds closer to d/c  Acute Encephalopathy  Discharge Plan:  -resolved, no further needs  -PT efforts  DISCHARGE SUMMARY  Melody Gray is a 43 y.o. y/o female, active smoker with a PMH of sarcoidosis (dx 2001 -followed by dr Melvyn Novas) on home O2 (since 2011), NICM, pulm HTN, OSA, CKD admitted 4/10 by IMTS with worsening SOB thought related to volume overload. Recently, her baseline prednisone had been stopped in March per Dr. Melvyn Novas. She was diuresed with some improvement and was ready for tx to floor. During floor admission, she was noted to have periods of apnea on bipap at night. However, on 4/12 she declined with worsening renal function, hyperkalemia, anemia requiring transfusion, hypercarbia and placed on bipap with transfer to ICU. PCCM consulted for evaluation. She unfortunately did not tolerated BiPAP and had worsening of respiratory status in the setting of acute renal failure.  ABG at that time: 7.1 / 95 / 74 / 34. Despite efforts of bipap, she required emergent intubation in the setting of hypercarbia & worsening mental status. PATIENT WAS A VERY DIFFICULT AIRWAY (she has extremely small mouth, large tongue, and very anterior larynx) - requiring efforts of CCS, Anesthesia, & CCM to obtain a temporary airway. She was then taken to the OR on 4/12 PM for revision / placement of tracheostomy. Post tracheostomy placement, she was returned to ICU. Patient was able to transition to ATC during day time and nocturnal vent support at night. Thus far, she has not been successful with PSV at night. Has required PRVC.  In the setting of acute renal failure  reltated to ARB + diuresis, her home medications were held and have not been restarted. Offending agents were held, patient was gently hydrated and sr cr has returned to normal.  She was evaluated by SLP for swallowing needs. Patient passed MBS on 4/17 and was cleared for regular consistency diet, thin liquids with chin tuck. Please see discharge plans as above for details.  For Sarcoid, would plan to treat with prednisone tapered from 40mg  down to off over 6 weeks.  Regarding tracheostomy, it is helping her OSA and given her difficult airway would not consider decannulation for minimum 3-4 weeks. May want to consider lifelong trach to assist with OSA.     6/10/2015post hosp  f/u ov/ transition of care/Wert re: sarcoid / pred at 10 mg / ? Not smoking ? / now trached Chief Complaint  Patient presents with  . Follow-up    Pt states her breathing has improved. No new co's today.   did not understand purpose of PMV leaves it in 24/7 and not using T collar at hs  Edema has resolved  rec Leave the purple plug out while sleeping with humidifed 02 collar in place Prednisone 10 mg daily     11/19/2013 f/u ov/Wert re: chronic resp failure/ stopped smoking at trach/ confused with trach instrucitons Chief Complaint  Patient presents with   . Follow-up    Pt reports had to go to ED 11/17/13 due to low o2 sats. She c/o slight increase in SOB for the past wk.     2lpm NP with PMV 2lpm with trach collar but feels it makes her noct cough worse, min mucoid trach secretions No ent f/u since trach placed. >>no changes   12/02/2013 [facility] follow up  Patient returns for a post hospital followup. Patient is admitted July 24 of the 29th for acute on chronic respiratory failure with presumed pneumonia. Sputum cultures returned with MSSA . She was treated with IV antibiotics, and transitioned to Levaquin. Prior to discharge. She's also treated with a prednisone taper with instructions to decrease down to 10 mg daily. Did require diuresis . Her component of fluid overload. Her metformin, and ARB were held do to renal insufficiency She has follow with her primary care doctor with followup labs.  Since discharge. She is improved with decreased cough, congestion, and wheezing. O2 saturations are improved, and she is back to her baseline oxygen demands, at home, with 3 L of nasal cannula with trach plugged and 2 L on trach collar at bedtime     ROS  The following are not active complaints unless bolded sore throat, dysphagia, dental problems, itching, sneezing,  nasal congestion or excess/ purulent secretions, ear ache,   fever, chills, sweats, unintended wt loss, pleuritic or exertional cp, hemoptysis,  orthopnea pnd or leg swelling, presyncope, palpitations, heartburn, abdominal pain, anorexia, nausea, vomiting, diarrhea  or change in bowel or urinary habits, change in stools or urine, dysuria,hematuria,  rash, arthralgias, visual complaints, headache, numbness weakness or ataxia or problems with walking or coordination,  change in mood/affect or memory.         Past Medical History:  Sarcoidosis (Dr Wert)-w/ liver involvement per biopsy 03/2008 - Reversible airway component so start [facility] 01/2010 > better  - HFa 75% p coaching  May 04, 2010  -  Off chronic prednisone 05/2011> restarted 07/2013  Unexplained Hypoxemia July 2011  - CT angiogram 01/07/10 >>> no PE  - PFT's February 09, 2010 FEV1 1.20 (49%) with ratio 72  and FRC 90%, FEV1  16% better p B2, DLC0 33% > corrects to 84   Morbid obesity  - Target wt = 153 for BMI < 30  - Trach 08/10/13 with very difficult airway([place]) QT prolongation  Diabetes mellitus, type II  Hypertension  Heart catheterization 06-06-06 : No CAD, no RAS, normal EF  Hx eclampsia  Hx of scalp seborrheic dermatitis            Objective:   Physical Exam  She is a minimally  cushingoid-appearing amb bf with trach in place no stridor and good phonation with PMV wt 238 January 06, 2010 >   217 May 04, 2010 >  02/12/2012  213 >  215 04/04/2012  > 239 04/08/2013 > 07/24/2013 249 > 07/31/2013  252 >   10/08/2013  233> 11/19/2013  242 >245 12/02/2013    HEENT: normocephalic; PEERLA; no nasal or pharyngeal abnormalities  neck: supple / trach site looks good / PMV in place nodes: no cervical lymphadenopathy  chest: clear to ausculatation and percussion though BS distant  heart: no murmurs, gallops, or rubs  - trace   Both abd: soft, nontender; BS normoactive; no abdominal masses, tenderness, organomegaly; abdomen is obese            Assessment:

## 2013-12-02 NOTE — Progress Notes (Signed)
Chart and ov reviewed, agree with a/p 

## 2013-12-03 DIAGNOSIS — I509 Heart failure, unspecified: Secondary | ICD-10-CM | POA: Diagnosis not present

## 2013-12-03 DIAGNOSIS — E119 Type 2 diabetes mellitus without complications: Secondary | ICD-10-CM | POA: Diagnosis not present

## 2013-12-03 DIAGNOSIS — Z9981 Dependence on supplemental oxygen: Secondary | ICD-10-CM | POA: Diagnosis not present

## 2013-12-03 DIAGNOSIS — Z430 Encounter for attention to tracheostomy: Secondary | ICD-10-CM | POA: Diagnosis not present

## 2013-12-03 DIAGNOSIS — I5032 Chronic diastolic (congestive) heart failure: Secondary | ICD-10-CM | POA: Diagnosis not present

## 2013-12-03 DIAGNOSIS — J9620 Acute and chronic respiratory failure, unspecified whether with hypoxia or hypercapnia: Secondary | ICD-10-CM | POA: Diagnosis not present

## 2013-12-04 ENCOUNTER — Other Ambulatory Visit: Payer: Self-pay | Admitting: Internal Medicine

## 2013-12-04 ENCOUNTER — Telehealth: Payer: Self-pay | Admitting: Dietician

## 2013-12-04 DIAGNOSIS — E119 Type 2 diabetes mellitus without complications: Secondary | ICD-10-CM

## 2013-12-04 NOTE — Progress Notes (Signed)
Pharmacy call and informed. Pt called and not at home.  Message left to call clinic to review medication change.

## 2013-12-05 DIAGNOSIS — Z430 Encounter for attention to tracheostomy: Secondary | ICD-10-CM | POA: Diagnosis not present

## 2013-12-05 DIAGNOSIS — J9620 Acute and chronic respiratory failure, unspecified whether with hypoxia or hypercapnia: Secondary | ICD-10-CM | POA: Diagnosis not present

## 2013-12-05 DIAGNOSIS — Z9981 Dependence on supplemental oxygen: Secondary | ICD-10-CM | POA: Diagnosis not present

## 2013-12-05 DIAGNOSIS — E119 Type 2 diabetes mellitus without complications: Secondary | ICD-10-CM | POA: Diagnosis not present

## 2013-12-05 DIAGNOSIS — I509 Heart failure, unspecified: Secondary | ICD-10-CM | POA: Diagnosis not present

## 2013-12-05 DIAGNOSIS — I5032 Chronic diastolic (congestive) heart failure: Secondary | ICD-10-CM | POA: Diagnosis not present

## 2013-12-05 NOTE — Telephone Encounter (Signed)
Patient confirms her correction scaled using rapid acting insulin works to control her blood sugars when she is on 5-10 mg prednisone and is:  CBG 70-120: 0 units  CBG 121-150: 1 unit  CBG 151-200: 2 units  CBG 201-250: 3 units  CBG 251-300: 5 units  CBG 301-350: 7 units  CBG 351-400: 9 units  CBG > 400: call MD  However, right now while she is on 20 mg Prednisone her blood sugars are higher in the day and evening to 200s and back down to 120s fasting (this am it was 125) . She says she is taking the correction 3 times a day ~ 8-9 AM, 12-1 PM and 5 PM. We discussed that she may need a different correction scale when she is on higher doses of prednisone and I would discuss this with her doctor.

## 2013-12-08 DIAGNOSIS — I5032 Chronic diastolic (congestive) heart failure: Secondary | ICD-10-CM | POA: Diagnosis not present

## 2013-12-08 DIAGNOSIS — E119 Type 2 diabetes mellitus without complications: Secondary | ICD-10-CM | POA: Diagnosis not present

## 2013-12-08 DIAGNOSIS — J9620 Acute and chronic respiratory failure, unspecified whether with hypoxia or hypercapnia: Secondary | ICD-10-CM | POA: Diagnosis not present

## 2013-12-08 DIAGNOSIS — Z430 Encounter for attention to tracheostomy: Secondary | ICD-10-CM | POA: Diagnosis not present

## 2013-12-08 DIAGNOSIS — Z9981 Dependence on supplemental oxygen: Secondary | ICD-10-CM | POA: Diagnosis not present

## 2013-12-08 DIAGNOSIS — I509 Heart failure, unspecified: Secondary | ICD-10-CM | POA: Diagnosis not present

## 2013-12-08 MED ORDER — INSULIN ASPART 100 UNIT/ML ~~LOC~~ SOLN: SUBCUTANEOUS | Status: DC

## 2013-12-08 NOTE — Progress Notes (Signed)
Pt has been informed.

## 2013-12-08 NOTE — Addendum Note (Signed)
Addended by: Wilber Oliphant on: 12/08/2013 08:14 AM   Modules accepted: Orders

## 2013-12-08 NOTE — Telephone Encounter (Signed)
i would leave her with the scale she is on for now given that her prednisone dose is usually changing very often, especially lately. I will adjust this scale officially on epic from the one prior but believe I have already called in novolog. If she needs it to be called in again, please let me know and we can do that as well.   Thanks!  Dr q

## 2013-12-10 NOTE — Telephone Encounter (Signed)
Novolog sliding scale rx called to [facility] pharmacy.

## 2013-12-11 DIAGNOSIS — E119 Type 2 diabetes mellitus without complications: Secondary | ICD-10-CM | POA: Diagnosis not present

## 2013-12-11 DIAGNOSIS — I509 Heart failure, unspecified: Secondary | ICD-10-CM | POA: Diagnosis not present

## 2013-12-11 DIAGNOSIS — J9620 Acute and chronic respiratory failure, unspecified whether with hypoxia or hypercapnia: Secondary | ICD-10-CM | POA: Diagnosis not present

## 2013-12-11 DIAGNOSIS — Z9981 Dependence on supplemental oxygen: Secondary | ICD-10-CM | POA: Diagnosis not present

## 2013-12-11 DIAGNOSIS — Z430 Encounter for attention to tracheostomy: Secondary | ICD-10-CM | POA: Diagnosis not present

## 2013-12-11 DIAGNOSIS — I5032 Chronic diastolic (congestive) heart failure: Secondary | ICD-10-CM | POA: Diagnosis not present

## 2013-12-12 DIAGNOSIS — I509 Heart failure, unspecified: Secondary | ICD-10-CM | POA: Diagnosis not present

## 2013-12-12 DIAGNOSIS — E119 Type 2 diabetes mellitus without complications: Secondary | ICD-10-CM | POA: Diagnosis not present

## 2013-12-12 DIAGNOSIS — I5032 Chronic diastolic (congestive) heart failure: Secondary | ICD-10-CM | POA: Diagnosis not present

## 2013-12-12 DIAGNOSIS — Z430 Encounter for attention to tracheostomy: Secondary | ICD-10-CM | POA: Diagnosis not present

## 2013-12-12 DIAGNOSIS — Z9981 Dependence on supplemental oxygen: Secondary | ICD-10-CM | POA: Diagnosis not present

## 2013-12-12 DIAGNOSIS — J9620 Acute and chronic respiratory failure, unspecified whether with hypoxia or hypercapnia: Secondary | ICD-10-CM | POA: Diagnosis not present

## 2013-12-15 DIAGNOSIS — I5032 Chronic diastolic (congestive) heart failure: Secondary | ICD-10-CM | POA: Diagnosis not present

## 2013-12-15 DIAGNOSIS — Z430 Encounter for attention to tracheostomy: Secondary | ICD-10-CM | POA: Diagnosis not present

## 2013-12-15 DIAGNOSIS — Z9981 Dependence on supplemental oxygen: Secondary | ICD-10-CM | POA: Diagnosis not present

## 2013-12-15 DIAGNOSIS — J9620 Acute and chronic respiratory failure, unspecified whether with hypoxia or hypercapnia: Secondary | ICD-10-CM | POA: Diagnosis not present

## 2013-12-15 DIAGNOSIS — E119 Type 2 diabetes mellitus without complications: Secondary | ICD-10-CM | POA: Diagnosis not present

## 2013-12-15 DIAGNOSIS — I509 Heart failure, unspecified: Secondary | ICD-10-CM | POA: Diagnosis not present

## 2013-12-16 ENCOUNTER — Encounter: Payer: Medicare Other | Admitting: Adult Health

## 2013-12-16 NOTE — Progress Notes (Signed)
Called and spoke with pt at this time to confirm trach clinic appt for 12/17/13. Confirmed with pt to arrive between 1330-1345.

## 2013-12-17 ENCOUNTER — Encounter: Payer: Self-pay | Admitting: Cardiology

## 2013-12-17 ENCOUNTER — Ambulatory Visit (HOSPITAL_COMMUNITY)
Admission: RE | Admit: 2013-12-17 | Discharge: 2013-12-17 | Disposition: A | Discharge status: Home / Self Care | Payer: Medicare Other | Source: Ambulatory Visit | Attending: Pulmonary Disease | Admitting: Pulmonary Disease

## 2013-12-17 ENCOUNTER — Ambulatory Visit (HOSPITAL_BASED_OUTPATIENT_CLINIC_OR_DEPARTMENT_OTHER)
Admission: RE | Admit: 2013-12-17 | Discharge: 2013-12-17 | Disposition: A | Discharge status: Home / Self Care | Payer: Medicare Other | Source: Ambulatory Visit | Attending: Cardiology | Admitting: Cardiology

## 2013-12-17 ENCOUNTER — Ambulatory Visit (INDEPENDENT_AMBULATORY_CARE_PROVIDER_SITE_OTHER): Payer: Medicare Other | Admitting: Cardiology

## 2013-12-17 VITALS — BP 130/87 | HR 119 | Ht 61.0 in | Wt 238.1 lb

## 2013-12-17 DIAGNOSIS — IMO0002 Reserved for concepts with insufficient information to code with codable children: Secondary | ICD-10-CM | POA: Diagnosis not present

## 2013-12-17 DIAGNOSIS — Z79899 Other long term (current) drug therapy: Secondary | ICD-10-CM

## 2013-12-17 DIAGNOSIS — M79662 Pain in left lower leg: Secondary | ICD-10-CM

## 2013-12-17 DIAGNOSIS — I428 Other cardiomyopathies: Secondary | ICD-10-CM

## 2013-12-17 DIAGNOSIS — M79609 Pain in unspecified limb: Secondary | ICD-10-CM

## 2013-12-17 DIAGNOSIS — Z430 Encounter for attention to tracheostomy: Secondary | ICD-10-CM | POA: Diagnosis not present

## 2013-12-17 DIAGNOSIS — I2789 Other specified pulmonary heart diseases: Secondary | ICD-10-CM

## 2013-12-17 DIAGNOSIS — E119 Type 2 diabetes mellitus without complications: Secondary | ICD-10-CM | POA: Insufficient documentation

## 2013-12-17 DIAGNOSIS — E785 Hyperlipidemia, unspecified: Secondary | ICD-10-CM | POA: Diagnosis not present

## 2013-12-17 DIAGNOSIS — J9612 Chronic respiratory failure with hypercapnia: Secondary | ICD-10-CM

## 2013-12-17 DIAGNOSIS — J9610 Chronic respiratory failure, unspecified whether with hypoxia or hypercapnia: Secondary | ICD-10-CM | POA: Diagnosis not present

## 2013-12-17 DIAGNOSIS — I509 Heart failure, unspecified: Secondary | ICD-10-CM

## 2013-12-17 DIAGNOSIS — D869 Sarcoidosis, unspecified: Secondary | ICD-10-CM | POA: Diagnosis not present

## 2013-12-17 DIAGNOSIS — G4730 Sleep apnea, unspecified: Secondary | ICD-10-CM

## 2013-12-17 DIAGNOSIS — I429 Cardiomyopathy, unspecified: Secondary | ICD-10-CM

## 2013-12-17 DIAGNOSIS — Z794 Long term (current) use of insulin: Secondary | ICD-10-CM | POA: Diagnosis not present

## 2013-12-17 DIAGNOSIS — M79606 Pain in leg, unspecified: Secondary | ICD-10-CM

## 2013-12-17 DIAGNOSIS — R000 Tachycardia, unspecified: Secondary | ICD-10-CM | POA: Insufficient documentation

## 2013-12-17 DIAGNOSIS — J9620 Acute and chronic respiratory failure, unspecified whether with hypoxia or hypercapnia: Secondary | ICD-10-CM

## 2013-12-17 DIAGNOSIS — M79661 Pain in right lower leg: Secondary | ICD-10-CM

## 2013-12-17 DIAGNOSIS — Z930 Tracheostomy status: Secondary | ICD-10-CM | POA: Diagnosis not present

## 2013-12-17 DIAGNOSIS — I2729 Other secondary pulmonary hypertension: Secondary | ICD-10-CM

## 2013-12-17 DIAGNOSIS — Z885 Allergy status to narcotic agent status: Secondary | ICD-10-CM | POA: Diagnosis not present

## 2013-12-17 DIAGNOSIS — Z87891 Personal history of nicotine dependence: Secondary | ICD-10-CM | POA: Diagnosis not present

## 2013-12-17 MED ORDER — CARVEDILOL 6.25 MG PO TABS: 6.2500 mg | ORAL_TABLET | Freq: Two times a day (BID) | ORAL | Status: DC

## 2013-12-17 MED ORDER — ATORVASTATIN CALCIUM 20 MG PO TABS: 20.0000 mg | ORAL_TABLET | Freq: Every day | ORAL | Status: DC

## 2013-12-17 NOTE — Assessment & Plan Note (Signed)
Lipid Panel     Component Value Date/Time   CHOL 198 09/12/2013 1115   TRIG 103 09/12/2013 1115   HDL 69 09/12/2013 1115   CHOLHDL 2.9 09/12/2013 1115   VLDL 21 09/12/2013 1115   LDLCALC 108* 09/12/2013 1115   On statin, will refill and recheck lipids in 4-6 weeks

## 2013-12-17 NOTE — Assessment & Plan Note (Signed)
Previously EF 30% now 60-65% with type II diastolic dysfunction.  She was diagnosed with sarcoid.

## 2013-12-17 NOTE — Progress Notes (Signed)
Lower Extremity Venous Duplex Completed. °Brianna L Mazza,RVT °

## 2013-12-17 NOTE — Progress Notes (Signed)
12/17/2013   PCP: Jerene Pitch, MD   Chief Complaint  Patient presents with  . Follow-up    Melody Gray    Primary Cardiologist: Dr. Thresa Ross   HPI:  43 year old female with past history of cardiomyopathy nonischemic EF of 35% with cardiac MRI revealing moderate to severe LV systolic dysfunction EF [id number]. There were small areas of mid wall delayed enhancement in the apical septal and apical lateral walls there was concern for sarcoidosis. She was further evaluated and was diagnosed with sarcoid.  She is followed by pulmonary Dr. worked for sarcoid and pulmonary artery hypertension.  She has not been seen by our office since 2013 she is here today after recent hospitalization for acute pneumonia unable to intubate so she had an emergent trach 07/2013.  Dr. Janace Hoard her ENT feels it is safer to leave the trach in place. It is plugged she uses nasal oxygen during the day and at night she uses a trach collar. She was diagnosed with sleep apnea during the hospitalization as well. Today she states she feels much better with the trach at night not as fatigued as she had been previously.    She has no chest pain no significant shortness of breath at this point. She does complain of bilateral calf pain was worse immediately post hospital but continues with exertion.  She was concern for peripheral arterial disease but as the symptoms are improving we would hold off on any arterial Dopplers.  Her echo during hospitalization revealed type II diastolic dysfunction.  Her EF has improved at 60-65%.  There was diastolic and systolic septal flattening suggestive of RV pressure and volume overload, although PASP was unable to be calculated. Mild biatrial enlargement. RV is mildly dilated and at least mildly hypokinetic, mainly lateral wall and apex. Mild, eccentric tricuspid regurgitation.  She is taking Lasix 40 mg daily with mild lower extremity edema that she brought to my  attention.    Allergies  Allergen Reactions  . Vicodin [Hydrocodone-Acetaminophen] Itching    Current Outpatient Prescriptions  Medication Sig Dispense Refill  . albuterol (PROVENTIL HFA;VENTOLIN HFA) 108 (90 BASE) MCG/ACT inhaler Inhale 2 puffs into the lungs every 6 (six) hours as needed for wheezing or shortness of breath.  1 Inhaler  2  . amLODipine (NORVASC) 5 MG tablet Take 1 tablet (5 mg total) by mouth daily.  30 tablet  5  . atorvastatin (LIPITOR) 20 MG tablet Take 1 tablet (20 mg total) by mouth daily at 6 PM.  90 tablet  3  . Dextromethorphan-Guaifenesin (MUCINEX DM MAXIMUM STRENGTH) 60-1200 MG TB12 One every 12 hours as needed for cough      . ferrous sulfate 325 (65 FE) MG tablet Take 1 tablet (325 mg total) by mouth 3 (three) times daily with meals.  90 tablet  3  . furosemide (LASIX) 40 MG tablet Take 40 mg by mouth daily.      . insulin aspart (NOVOLOG) 100 UNIT/ML injection Take novolog per sliding scale: CBG 70-120: 0 units  CBG 121-150: 1 unit  CBG 151-200: 2 units  CBG 201-250: 3 units  CBG 251-300: 5 units  CBG 301-350: 7 units  CBG 351-400: 9 units  CBG > 400: call MD  30 mL  3  . insulin detemir (LEVEMIR) 100 UNIT/ML injection Inject 0.2-0.26 mLs (20-26 Units total) into the skin at bedtime. 26 units every morning and 20 units every night. diag code 250.00  30 mL  3  . irbesartan (AVAPRO) 150 MG tablet Take 1 tablet by mouth daily.      . Melatonin 3 MG TABS Take 1 tablet (3 mg total) by mouth at bedtime as needed.    0  . metFORMIN (GLUCOPHAGE) 1000 MG tablet Take 1 tablet by mouth 2 (two) times daily.      Marland Kitchen omeprazole (PRILOSEC) 20 MG capsule Take 20 mg by mouth daily.      . predniSONE (DELTASONE) 10 MG tablet take 1 tablet by mouth once daily WITH BREAKFAST  30 tablet  2  . carvedilol (COREG) 6.25 MG tablet Take 1 tablet (6.25 mg total) by mouth 2 (two) times daily.  180 tablet  3   No current facility-administered medications for this visit.    Past  Medical History  Diagnosis Date  . Sarcoidosis     Followed by Dr. Melvyn Novas; w/ liver involvement per biopsy 12/09, Reversible airway component so started on [facility] 01/2010; HFA 75% p coaching 05/2010  . Hypoxemia     CT angiogram 9/11>> No PE; PFTs 10/11- FEV1 1.20 (49%) with 16% better p B2, DLCO 33%> corrects to 84; O2 sats ok on 4 lpm X rapid walk X 3 laps 05/2010  . Morbid obesity     Target wt= 153 for BMI <30  . QT prolongation   . Diabetes mellitus   . Hypertension   . Hx of cardiac cath 2/08    No CAD, no RAS,  normal EF  . Seborrheic dermatitis of scalp   . 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 Hep B serology neg. but Hep C ab +, HIV neg. 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 Cardiomyopathy, nonischemic     EF 45% 12/10; Echo 7/11 normal EF, PAS 48  . Diabetic retinopathy     Right eye 2/11  . Health maintenance examination     Mammogram 05/2010 Negative; Last Pap smear 03/2008; Last DM eye exam 2/11> mild non-proliferative diabetic retinopathy. OD  . Helicobacter pylori ab+ 05/2011    Pt was symptomatic and treatment planned for 05/2011  . CHF (congestive heart failure)     Past Surgical History  Procedure Laterality Date  . Tubal ligation    . Breast surgery    . Cesarean section    . Tracheostomy tube placement N/A 08/10/2013    Procedure: TRACHEOSTOMY;  Surgeon: Melissa Montane, MD;  Location: [place];  Service: ENT;  Laterality: N/A;   Lipid Panel     Component Value Date/Time   CHOL 198 09/12/2013 1115   TRIG 103 09/12/2013 1115   HDL 69 09/12/2013 1115   CHOLHDL 2.9 09/12/2013 1115   VLDL 21 09/12/2013 1115   LDLCALC 108* 09/12/2013 1115     [id number] colds or fevers, no weight changes Skin:no rashes or ulcers HEENT:no blurred vision, no congestion CV:see HPI PUL:see HPI- trach, she plugs during the day and uses trach collar at night. GI:no diarrhea constipation or melena, no  indigestion GU:no hematuria, no dysuria MS:no joint pain, + claudication of both calves immediate post hospital with ambulation, now improved though tender to touch Neuro:no syncope, no lightheadedness Endo:+ diabetes- glucose elevated with prenisone, no thyroid disease  Wt Readings from Last 3 Encounters:  12/17/13 238 lb 1.6 oz (108.001 kg)  12/02/13 245 lb 9.6 oz (111.403 kg)  11/26/13 244 lb 14.9 oz (111.1  kg)    PHYSICAL EXAM BP 130/87  Pulse 119  Ht 5\' 1"  (1.549 m)  Wt 238 lb 1.6 oz (108.001 kg)  BMI 45.01 kg/m2 After patient walked back to 3 and her heart rate was elevated at 119 when she rested and did come down to 103. General:Pleasant affect, NAD Skin:Warm and dry, brisk capillary refill HEENT:normocephalic, sclera clear, mucus membranes moist, oxygen per nasal cannula Neck:supple, no JVD, no bruits, no adenopathy, trach in place and plugged  Heart:S1S2 RRR, rapid without murmur, gallup, rub or click Lungs:clear without rales, rhonchi, or wheezes [id number], soft, non tender, + BS, do not palpate liver spleen or masses Ext:tr to 1 + lower ext edema, 2+ pedal pulses, 2+ radial pulses, + pain with compression of Bil. Calves. Neuro:alert and oriented X 3, MAE, follows commands, + facial symmetry EKG:S Tach 103  No acute changes  ASSESSMENT AND PLAN Cardiomyopathy Previously EF 30% now 60-65% with type II diastolic dysfunction.  She was diagnosed with sarcoid.  CONGESTIVE HEART FAILURE Last cath 2008 normal coronary arteries , EF 60-65% now on recent echo with grade 2 diastolic dysfunction -diastolic HF with last admit, but also with severe PNA.   Pulmonary hypertension associated with sarcoidosis Followed by Dr. Melvyn Novas  Diabetes mellitus type II, controlled Usually runs a little high, this am was 83.  Hyperlipidemia with target LDL less than 100 Lipid Panel     Component Value Date/Time   CHOL 198 09/12/2013 1115   TRIG 103 09/12/2013 1115   HDL 69 09/12/2013 1115     CHOLHDL 2.9 09/12/2013 1115   VLDL 21 09/12/2013 1115   LDLCALC 108* 09/12/2013 1115   On statin, will refill and recheck lipids in 4-6 weeks   Bilateral calf pain Will check bil venous dopplers with recent hospitalization and calf pain.  Tachycardia Increases with exertion up to 119. She is also short of breath with exertion possibly related to tachycardia. Previously she been on higher doses of coreg, though there is concern that it could cause more bronchospasm. Will increase to 6.25 mg twice a day she will call if she has increasing shortness of breath I will see her back in 2 weeks to further adjust.  Sleep apnea Uses a trach collar at night with oxygen.

## 2013-12-17 NOTE — Progress Notes (Signed)
Tracheostomy Procedure Note  ELEFTERIA MUNGOVAN [id number] 1971/03/07  Pre Procedure Tracheostomy Information  Trach Brand: Shiley Size: 6 Style: Uncuffed Secured by: Velcro   Procedure: No trach change today, just cleaning. Pt wearing PMV   Vital signs:blood pressure 165/103, pulse 95, respirations 18 and pulse oximetry 98% on 3L [place] Trach site exam: clean, dry Wound care done: 4 x 4 gauze   Education: None  Prescription needs: None  Will see pt again in 2 months.

## 2013-12-17 NOTE — Patient Instructions (Addendum)
Your physician recommends that you schedule a follow-up appointment in: 2 Weeks with Mickel Baas and 6 Weeks with Dr Stanford Breed  Your physician recommends that you return for lab work in: [place] and Fasting lipids profile  Your physician has recommended you make the following change in your medication: Increase Carvedilol to 6.25 mg twice daily  Your physician has requested that you have a lower extremity venous duplex. This test is an ultrasound of the veins in the legs. It looks at venous blood flow that carries blood from the heart to the legs. Allow one hour for a Lower Venous exam. Allow thirty minutes for an Upper Venous exam. There are no restrictions or special instructions.

## 2013-12-17 NOTE — Assessment & Plan Note (Signed)
Followed by Dr Wert 

## 2013-12-17 NOTE — Assessment & Plan Note (Addendum)
Last cath 2008 normal coronary arteries , EF 60-65% now on recent echo with grade 2 diastolic dysfunction -diastolic HF with last admit, but also with severe PNA.

## 2013-12-17 NOTE — Assessment & Plan Note (Signed)
Uses a trach collar at night with oxygen.

## 2013-12-17 NOTE — Assessment & Plan Note (Signed)
Will check bil venous dopplers with recent hospitalization and calf pain.

## 2013-12-17 NOTE — Assessment & Plan Note (Signed)
Increases with exertion up to 119. She is also short of breath with exertion possibly related to tachycardia. Previously she been on higher doses of coreg, though there is concern that it could cause more bronchospasm. Will increase to 6.25 mg twice a day she will call if she has increasing shortness of breath I will see her back in 2 weeks to further adjust.

## 2013-12-17 NOTE — Consult Note (Signed)
Name: Melody Gray MRN: [id number] DOB: [birth year]    ADMISSION DATE:  12/17/2013 CONSULTATION DATE:  12/17/2013  REFERRING MD :  [name] PRIMARY SERVICE:  N/A  CHIEF COMPLAINT:  [place] [facility] consult  BRIEF PATIENT DESCRIPTION: 43 year old morbidly obese female with history of sarcoidosis diagnosed in 2001 who was emergently trached on 07/2013 due to hypercarbic respiratory failure by ENT after anesthesia was unable to intubate her.  Patient did well until July when she had another episode and trach was changed from cuffless to cuffed.  Prior to discharge was again changed to a cuffless 6 shiley.  Patient coming to see [name] in the trach clinic to get established for chronic trach care.  SIGNIFICANT EVENTS / STUDIES:  07/2013 trach by ENT emergently 10/2013 another episode of hypercarbic respiratory failure needing cuffless trach to be changed to a cuffed one.  PAST MEDICAL HISTORY :  Past Medical History  Diagnosis Date  . Sarcoidosis     Followed by Dr. Melvyn Novas; w/ liver involvement per biopsy 12/09, Reversible airway component so started on [facility] 01/2010; HFA 75% p coaching 05/2010  . Hypoxemia     CT angiogram 9/11>> No PE; PFTs 10/11- FEV1 1.20 (49%) with 16% better p B2, DLCO 33%> corrects to 84; O2 sats ok on 4 lpm X rapid walk X 3 laps 05/2010  . Morbid obesity     Target wt= 153 for BMI <30  . QT prolongation   . Diabetes mellitus   . Hypertension   . Hx of cardiac cath 2/08    No CAD, no RAS,  normal EF  . Seborrheic dermatitis of scalp   . 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 Hep B serology neg. but Hep C ab +, HIV neg. 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 Cardiomyopathy, nonischemic     EF 45% 12/10; Echo 7/11 normal EF, PAS 48  . Diabetic retinopathy     Right eye 2/11  . Health maintenance examination     Mammogram 05/2010 Negative; Last Pap smear 03/2008; Last DM eye exam 2/11>  mild non-proliferative diabetic retinopathy. OD  . Helicobacter pylori ab+ 05/2011    Pt was symptomatic and treatment planned for 05/2011  . CHF (congestive heart failure)    Past Surgical History  Procedure Laterality Date  . Tubal ligation    . Breast surgery    . Cesarean section    . Tracheostomy tube placement N/A 08/10/2013    Procedure: TRACHEOSTOMY;  Surgeon: Melissa Montane, MD;  Location: [place];  Service: ENT;  Laterality: N/A;   Prior to Admission medications   Medication Sig Start Date End Date Taking? Authorizing Provider  albuterol (PROVENTIL HFA;VENTOLIN HFA) 108 (90 BASE) MCG/ACT inhaler Inhale 2 puffs into the lungs every 6 (six) hours as needed for wheezing or shortness of breath. 11/26/13   Marijean Heath, NP  amLODipine (NORVASC) 5 MG tablet Take 1 tablet (5 mg total) by mouth daily.    Wilber Oliphant, MD  atorvastatin (LIPITOR) 20 MG tablet Take 1 tablet (20 mg total) by mouth daily at 6 PM. 12/17/13   Cecilie Kicks, NP  carvedilol (COREG) 6.25 MG tablet Take 1 tablet (6.25 mg total) by mouth 2 (two) times daily. 12/17/13   Cecilie Kicks, NP  Dextromethorphan-Guaifenesin (MUCINEX DM MAXIMUM STRENGTH) 60-1200 MG TB12 One every 12 hours as needed for  cough 11/19/13   Tanda Rockers, MD  ferrous sulfate 325 (65 FE) MG tablet Take 1 tablet (325 mg total) by mouth 3 (three) times daily with meals.    Wilber Oliphant, MD  furosemide (LASIX) 40 MG tablet Take 40 mg by mouth daily. 11/26/13 11/26/14  Marijean Heath, NP  insulin aspart (NOVOLOG) 100 UNIT/ML injection Take novolog per sliding scale: CBG 70-120: 0 units  CBG 121-150: 1 unit  CBG 151-200: 2 units  CBG 201-250: 3 units  CBG 251-300: 5 units  CBG 301-350: 7 units  CBG 351-400: 9 units  CBG > 400: call MD 12/08/13   Wilber Oliphant, MD  insulin detemir (LEVEMIR) 100 UNIT/ML injection Inject 0.2-0.26 mLs (20-26 Units total) into the skin at bedtime. 26 units every morning and 20 units every night. diag code 250.00  12/01/13   Wilber Oliphant, MD  irbesartan (AVAPRO) 150 MG tablet Take 1 tablet by mouth daily. 11/11/13   Historical Provider, MD  Melatonin 3 MG TABS Take 1 tablet (3 mg total) by mouth at bedtime as needed. 11/26/13   Marijean Heath, NP  metFORMIN (GLUCOPHAGE) 1000 MG tablet Take 1 tablet by mouth 2 (two) times daily. 11/11/13   Historical Provider, MD  omeprazole (PRILOSEC) 20 MG capsule Take 20 mg by mouth daily.    Historical Provider, MD  predniSONE (DELTASONE) 10 MG tablet take 1 tablet by mouth once daily WITH BREAKFAST 11/11/13   Tanda Rockers, MD   Allergies  Allergen Reactions  . Vicodin [Hydrocodone-Acetaminophen] Itching    FAMILY HISTORY:  Family History  Problem Relation Age of Onset  . Cancer Mother     colon cancer  . Multiple sclerosis Father   . Asthma Sister     in childhood  . Diabetes Father   . Hypertension     SOCIAL HISTORY:  reports that she has quit smoking. She has never used smokeless tobacco. She reports that she does not drink alcohol or use illicit drugs.  REVIEW OF SYSTEMS:   Constitutional: Negative for fever, chills, weight loss, malaise/fatigue and diaphoresis.  HENT: Negative for hearing loss, ear pain, nosebleeds, congestion, sore throat, neck pain, tinnitus and ear discharge.   Eyes: Negative for blurred vision, double vision, photophobia, pain, discharge and redness.  Respiratory: Negative for cough, hemoptysis, sputum production, shortness of breath, wheezing and stridor.   Cardiovascular: Negative for chest pain, palpitations, orthopnea, claudication, leg swelling and PND.  Gastrointestinal: Negative for heartburn, nausea, vomiting, abdominal pain, diarrhea, constipation, blood in stool and melena.  Genitourinary: Negative for dysuria, urgency, frequency, hematuria and flank pain.  Musculoskeletal: Negative for myalgias, back pain, joint pain and falls.  Skin: Negative for itching and rash.  Neurological: Negative for dizziness,  tingling, tremors, sensory change, speech change, focal weakness, seizures, loss of consciousness, weakness and headaches.  Endo/Heme/Allergies: Negative for environmental allergies and polydipsia. Does not bruise/bleed easily.  SUBJECTIVE: Patient has no complaints, reports an episode of bleeding from trach site.  VITAL SIGNS: Pulse Rate:  [119] 119 (08/19 0917) BP: (130)/(87) 130/87 mmHg (08/19 0917) Weight:  [238 lb 1.6 oz (108.001 kg)] 238 lb 1.6 oz (108.001 kg) (08/19 0917)  PHYSICAL EXAMINATION: General:  Morbidly obese female, NAD. Neuro:  Alert and interactive.  Moving all ext to command. HEENT:  [place]/AT, PERRL, EOM-I and MMM. Cardiovascular:  RRR, Nl S1/S2, -M/R/G. Lungs:  Diminished BS at the left and right is clear. Abdomen:  Obese, soft, NT, ND and +BS. Musculoskeletal:  -  edema and -tenderness. Skin:  Intact.  No results found for this basename: NA, K, CL, CO2, BUN, CREATININE, GLUCOSE,  in the last 168 hours No results found for this basename: HGB, HCT, WBC, PLT,  in the last 168 hours No results found.  ASSESSMENT / PLAN:  43 year old with morbid obesity, sarcoid and heart failure.  I also strongly suspect OSA.  Trached emergently for hypercarbic respiratory failure in April then had another episode in July.  Patient has a six cuffless shiley that was changed in 11/26/2013.   Plan: - No change of tracheostomy today. - No decannulation given difficulty of airway and frequent episodes of respiratory failure. - Supplies given. - Titrate O2 down as able. - RTC in two months for change of tracheostomy, if stable by then will change to a cuffless 4. - If decannulation is a consideration then would like to see the trach capped and a sleep study done with the trach capped.  Rush Farmer, M.D. [facility] Pulmonary/Critical Care Medicine. Pager: [phone]. After hours pager: [phone].  12/17/2013, 1:35 PM

## 2013-12-17 NOTE — Assessment & Plan Note (Signed)
Usually runs a little high, this am was 83.

## 2013-12-23 DIAGNOSIS — J9620 Acute and chronic respiratory failure, unspecified whether with hypoxia or hypercapnia: Secondary | ICD-10-CM | POA: Diagnosis not present

## 2013-12-23 DIAGNOSIS — Z430 Encounter for attention to tracheostomy: Secondary | ICD-10-CM | POA: Diagnosis not present

## 2013-12-23 DIAGNOSIS — Z9981 Dependence on supplemental oxygen: Secondary | ICD-10-CM | POA: Diagnosis not present

## 2013-12-23 DIAGNOSIS — E119 Type 2 diabetes mellitus without complications: Secondary | ICD-10-CM | POA: Diagnosis not present

## 2013-12-23 DIAGNOSIS — I5032 Chronic diastolic (congestive) heart failure: Secondary | ICD-10-CM | POA: Diagnosis not present

## 2013-12-23 DIAGNOSIS — I509 Heart failure, unspecified: Secondary | ICD-10-CM | POA: Diagnosis not present

## 2013-12-29 ENCOUNTER — Encounter: Payer: Self-pay | Admitting: Internal Medicine

## 2013-12-30 ENCOUNTER — Ambulatory Visit: Payer: Medicare Other | Admitting: Internal Medicine

## 2013-12-30 ENCOUNTER — Encounter: Payer: Self-pay | Admitting: Internal Medicine

## 2013-12-30 ENCOUNTER — Encounter: Payer: Medicare Other | Admitting: Internal Medicine

## 2013-12-31 DIAGNOSIS — I5032 Chronic diastolic (congestive) heart failure: Secondary | ICD-10-CM | POA: Diagnosis not present

## 2013-12-31 DIAGNOSIS — Z9981 Dependence on supplemental oxygen: Secondary | ICD-10-CM | POA: Diagnosis not present

## 2013-12-31 DIAGNOSIS — Z430 Encounter for attention to tracheostomy: Secondary | ICD-10-CM | POA: Diagnosis not present

## 2013-12-31 DIAGNOSIS — I509 Heart failure, unspecified: Secondary | ICD-10-CM | POA: Diagnosis not present

## 2013-12-31 DIAGNOSIS — J9620 Acute and chronic respiratory failure, unspecified whether with hypoxia or hypercapnia: Secondary | ICD-10-CM | POA: Diagnosis not present

## 2013-12-31 DIAGNOSIS — E119 Type 2 diabetes mellitus without complications: Secondary | ICD-10-CM | POA: Diagnosis not present

## 2014-01-02 ENCOUNTER — Encounter: Payer: Self-pay | Admitting: Physician Assistant

## 2014-01-02 ENCOUNTER — Ambulatory Visit (INDEPENDENT_AMBULATORY_CARE_PROVIDER_SITE_OTHER): Payer: Medicare Other | Admitting: Physician Assistant

## 2014-01-02 VITALS — BP 135/88 | HR 95 | Ht 61.0 in | Wt 231.6 lb

## 2014-01-02 DIAGNOSIS — Z79899 Other long term (current) drug therapy: Secondary | ICD-10-CM | POA: Diagnosis not present

## 2014-01-02 DIAGNOSIS — I10 Essential (primary) hypertension: Secondary | ICD-10-CM

## 2014-01-02 DIAGNOSIS — R000 Tachycardia, unspecified: Secondary | ICD-10-CM | POA: Diagnosis not present

## 2014-01-02 DIAGNOSIS — E785 Hyperlipidemia, unspecified: Secondary | ICD-10-CM | POA: Diagnosis not present

## 2014-01-02 DIAGNOSIS — I5032 Chronic diastolic (congestive) heart failure: Secondary | ICD-10-CM | POA: Diagnosis not present

## 2014-01-02 DIAGNOSIS — E119 Type 2 diabetes mellitus without complications: Secondary | ICD-10-CM

## 2014-01-02 DIAGNOSIS — G4730 Sleep apnea, unspecified: Secondary | ICD-10-CM

## 2014-01-02 NOTE — Patient Instructions (Signed)
1.  Follow up with Dr. Stanford Breed in 3 months.  2.  I will refer you to Estelle Grumbles, registered dietician

## 2014-01-02 NOTE — Assessment & Plan Note (Signed)
Patient is on a statin.  Last lipid panel 09/12/2013 Results for Melody Gray, Melody Gray (MRN [id number]) as of 01/02/2014 17:05  Ref. Range 09/12/2013 11:15  Cholesterol Latest Range: 0-200 mg/dL 198  Triglycerides Latest Range: <150 mg/dL 103  HDL Latest Range: >39 mg/dL 69  LDL (calc) Latest Range: 0-99 mg/dL 108 (H)  VLDL Latest Range: 0-40 mg/dL 21  Total CHOL/HDL Ratio No range found 2.9

## 2014-01-02 NOTE — Assessment & Plan Note (Signed)
Heart rate mildly improved on increased Coreg. Patient states she feels great with more energy

## 2014-01-02 NOTE — Assessment & Plan Note (Signed)
Blood pressure controlled no change in therapy

## 2014-01-02 NOTE — Progress Notes (Signed)
Date:  01/02/2014   ID:  RUSSCHELLE SLEET, DOB [birth year], MRN [id number]  PCP:  Jerene Pitch, MD  Primary Cardiologist:  Stanford Breed    History of Present Illness: Melody Gray is a 43 y.o. female with past history of cardiomyopathy nonischemic EF of 35% with cardiac MRI revealing moderate to severe LV systolic dysfunction EF [id number]. There were small areas of mid wall delayed enhancement in the apical septal and apical lateral walls there was concern for sarcoidosis. She was further evaluated and was diagnosed with sarcoid. She is followed by pulmonary Dr. worked for sarcoid and pulmonary artery hypertension.   She has not been seen by our office since 2013 she is here today after recent hospitalization for acute pneumonia unable to intubate so she had an emergent trach 07/2013. Dr. Janace Hoard her ENT feels it is safer to leave the trach in place. It is plugged she uses nasal oxygen during the day and at night she uses a trach collar. She was diagnosed with sleep apnea during the hospitalization as well.   Her echo during hospitalization revealed grade II diastolic dysfunction. Her EF has improved at 60-65%. There was diastolic and systolic septal flattening suggestive of RV pressure and volume overload, although PASP was unable to be calculated. Mild biatrial enlargement. RV is mildly dilated and at least mildly hypokinetic, mainly lateral wall and apex. Mild, eccentric tricuspid regurgitation.  Patient saw Cecilie Kicks recently at which time she increased her Coreg to tachycardia. She presents today for followup. She states she feels good with a lot more energy. She is wearing a oxygen.  She currently denies nausea, vomiting, fever, chest pain, shortness of breath beyond baseline, orthopnea, dizziness, PND, cough, congestion, abdominal pain, hematochezia, melena, lower extremity edema, claudication.  Her weight has decreased 7 pounds since prior visit.    Wt Readings from Last 3 Encounters:  01/02/14  231 lb 9.6 oz (105.053 kg)  12/17/13 238 lb 1.6 oz (108.001 kg)  12/02/13 245 lb 9.6 oz (111.403 kg)     Past Medical History  Diagnosis Date  . Sarcoidosis     Followed by Dr. Melvyn Novas; w/ liver involvement per biopsy 12/09, Reversible airway component so started on [facility] 01/2010; HFA 75% p coaching 05/2010  . Hypoxemia     CT angiogram 9/11>> No PE; PFTs 10/11- FEV1 1.20 (49%) with 16% better p B2, DLCO 33%> corrects to 84; O2 sats ok on 4 lpm X rapid walk X 3 laps 05/2010  . Morbid obesity     Target wt= 153 for BMI <30  . QT prolongation   . Diabetes mellitus   . Hypertension   . Hx of cardiac cath 2/08    No CAD, no RAS,  normal EF  . Seborrheic dermatitis of scalp   . 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 Hep B serology neg. but Hep C ab +, HIV neg. 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 Cardiomyopathy, nonischemic     EF 45% 12/10; Echo 7/11 normal EF, PAS 48  . Diabetic retinopathy     Right eye 2/11  . Health maintenance examination     Mammogram 05/2010 Negative; Last Pap smear 03/2008; Last DM eye exam 2/11> mild non-proliferative diabetic retinopathy. OD  . Helicobacter pylori ab+ 05/2011    Pt was symptomatic and treatment planned for 05/2011  . CHF (congestive heart failure)  Current Outpatient Prescriptions  Medication Sig Dispense Refill  . albuterol (PROVENTIL HFA;VENTOLIN HFA) 108 (90 BASE) MCG/ACT inhaler Inhale 2 puffs into the lungs every 6 (six) hours as needed for wheezing or shortness of breath.  1 Inhaler  2  . amLODipine (NORVASC) 5 MG tablet Take 1 tablet (5 mg total) by mouth daily.  30 tablet  5  . atorvastatin (LIPITOR) 20 MG tablet Take 1 tablet (20 mg total) by mouth daily at 6 PM.  90 tablet  3  . carvedilol (COREG) 6.25 MG tablet Take 1 tablet (6.25 mg total) by mouth 2 (two) times daily.  180 tablet  3  . Dextromethorphan-Guaifenesin (MUCINEX DM MAXIMUM STRENGTH)  60-1200 MG TB12 One every 12 hours as needed for cough      . ferrous sulfate 325 (65 FE) MG tablet Take 1 tablet (325 mg total) by mouth 3 (three) times daily with meals.  90 tablet  3  . furosemide (LASIX) 40 MG tablet Take 40 mg by mouth daily.      . insulin aspart (NOVOLOG) 100 UNIT/ML injection Take novolog per sliding scale: CBG 70-120: 0 units  CBG 121-150: 1 unit  CBG 151-200: 2 units  CBG 201-250: 3 units  CBG 251-300: 5 units  CBG 301-350: 7 units  CBG 351-400: 9 units  CBG > 400: call MD  30 mL  3  . insulin detemir (LEVEMIR) 100 UNIT/ML injection Inject 0.2-0.26 mLs (20-26 Units total) into the skin at bedtime. 26 units every morning and 20 units every night. diag code 250.00  30 mL  3  . irbesartan (AVAPRO) 150 MG tablet Take 1 tablet by mouth daily.      . Melatonin 3 MG TABS Take 1 tablet (3 mg total) by mouth at bedtime as needed.    0  . metFORMIN (GLUCOPHAGE) 1000 MG tablet Take 1 tablet by mouth 2 (two) times daily.      Marland Kitchen omeprazole (PRILOSEC) 20 MG capsule Take 20 mg by mouth daily.      . predniSONE (DELTASONE) 10 MG tablet take 1 tablet by mouth once daily WITH BREAKFAST  30 tablet  2   No current facility-administered medications for this visit.    Allergies:    Allergies  Allergen Reactions  . Vicodin [Hydrocodone-Acetaminophen] Itching    Social History:  The patient  reports that she has quit smoking. She has never used smokeless tobacco. She reports that she does not drink alcohol or use illicit drugs.   Family history:   Family History  Problem Relation Age of Onset  . Cancer Mother     colon cancer  . Multiple sclerosis Father   . Asthma Sister     in childhood  . Diabetes Father   . Hypertension      ROS:  Please see the history of present illness.  All other systems reviewed and negative.   PHYSICAL EXAM: VS:  BP 135/88  Pulse 95  Ht 5\' 1"  (1.549 m)  Wt 231 lb 9.6 oz (105.053 kg)  BMI 43.78 kg/m2 Morbidly obese, well developed, in no  acute distress on supplemental oxygen therapy HEENT: Pupils are equal round react to light accommodation extraocular movements are intact.  Neck: no JVDNo cervical lymphadenopathy. Cardiac: Regular rate and rhythm without murmurs rubs or gallops. Lungs:  Mild right rhonchi. Abd: soft, nontender, positive bowel sounds Ext: no lower extremity edema.  2+ radial and dorsalis pedis pulses. Skin: warm and dry Neuro:  Grossly normal  EKG:  Normal sinus rhythm rate 87 beats per minute left axis deviation biphasic P waves indicating interval enlargement  ASSESSMENT AND PLAN:  Problem List Items Addressed This Visit   Sleep apnea (Chronic)   Diabetes mellitus type II, controlled   OBESITY, MORBID     Patient referred 2 registered dietitian for weight loss assistance.    HYPERTENSION     Blood pressure controlled no change in therapy    Chronic diastolic heart failure, grade 2     Patient appears euvolemic. Her weight has decreased 7 pounds since prior office visit. Continue 40 mg of Lasix daily.      Hyperlipidemia with target LDL less than 100      Patient is on a statin.  Last lipid panel 09/12/2013 Results for ALELI, BEED (MRN [id number]) as of 01/02/2014 17:05  Ref. Range 09/12/2013 11:15  Cholesterol Latest Range: 0-200 mg/dL 198  Triglycerides Latest Range: <150 mg/dL 103  HDL Latest Range: >39 mg/dL 69  LDL (calc) Latest Range: 0-99 mg/dL 108 (H)  VLDL Latest Range: 0-40 mg/dL 21  Total CHOL/HDL Ratio No range found 2.9      Tachycardia - Primary     Heart rate mildly improved on increased Coreg. Patient states she feels great with more energy    Relevant Orders      EKG 12-Lead

## 2014-01-02 NOTE — Assessment & Plan Note (Signed)
Patient referred 2 registered dietitian for weight loss assistance.

## 2014-01-02 NOTE — Assessment & Plan Note (Signed)
Patient appears euvolemic. Her weight has decreased 7 pounds since prior office visit. Continue 40 mg of Lasix daily.

## 2014-01-03 LAB — COMPREHENSIVE METABOLIC PANEL
ALT: 21 U/L (ref 0–35)
AST: 22 U/L (ref 0–37)
Albumin: 4.4 g/dL (ref 3.5–5.2)
Alkaline Phosphatase: 115 U/L (ref 39–117)
BUN: 27 mg/dL — ABNORMAL HIGH (ref 6–23)
CO2: 32 mEq/L (ref 19–32)
Calcium: 9.7 mg/dL (ref 8.4–10.5)
Chloride: 97 mEq/L (ref 96–112)
Creat: 1.26 mg/dL — ABNORMAL HIGH (ref 0.50–1.10)
Glucose, Bld: 163 mg/dL — ABNORMAL HIGH (ref 70–99)
Potassium: 4.9 mEq/L (ref 3.5–5.3)
Sodium: 138 mEq/L (ref 135–145)
Total Bilirubin: 0.5 mg/dL (ref 0.2–1.2)
Total Protein: 7.7 g/dL (ref 6.0–8.3)

## 2014-01-03 LAB — LIPID PANEL
Cholesterol: 238 mg/dL — ABNORMAL HIGH (ref 0–200)
HDL: 70 mg/dL (ref >39)
LDL Cholesterol: 118 mg/dL — ABNORMAL HIGH (ref 0–99)
Total CHOL/HDL Ratio: 3.4 Ratio
Triglycerides: 252 mg/dL — ABNORMAL HIGH (ref <150)
VLDL: 50 mg/dL — ABNORMAL HIGH (ref 0–40)

## 2014-01-09 ENCOUNTER — Encounter: Payer: Self-pay | Admitting: *Deleted

## 2014-01-27 ENCOUNTER — Encounter: Payer: Self-pay | Admitting: *Deleted

## 2014-01-29 ENCOUNTER — Encounter: Payer: Self-pay | Admitting: Adult Health

## 2014-01-29 ENCOUNTER — Ambulatory Visit (INDEPENDENT_AMBULATORY_CARE_PROVIDER_SITE_OTHER): Payer: Medicare Other | Admitting: Adult Health

## 2014-01-29 ENCOUNTER — Ambulatory Visit (INDEPENDENT_AMBULATORY_CARE_PROVIDER_SITE_OTHER)
Admission: RE | Admit: 2014-01-29 | Discharge: 2014-01-29 | Disposition: A | Discharge status: Home / Self Care | Payer: Medicare Other | Source: Ambulatory Visit | Attending: Adult Health | Admitting: Adult Health

## 2014-01-29 VITALS — BP 124/76 | HR 95 | Temp 98.9°F | Ht 61.0 in | Wt 241.2 lb

## 2014-01-29 DIAGNOSIS — J9811 Atelectasis: Secondary | ICD-10-CM | POA: Diagnosis not present

## 2014-01-29 DIAGNOSIS — D869 Sarcoidosis, unspecified: Secondary | ICD-10-CM

## 2014-01-29 DIAGNOSIS — D860 Sarcoidosis of lung: Secondary | ICD-10-CM | POA: Diagnosis not present

## 2014-01-29 MED ORDER — AMOXICILLIN-POT CLAVULANATE 875-125 MG PO TABS: 1.0000 | ORAL_TABLET | Freq: Two times a day (BID) | ORAL | Status: AC

## 2014-01-29 NOTE — Progress Notes (Signed)
Subjective:     Patient ID: Melody Gray, female   DOB: 1971-03-28   MRN: [id number]   Brief patient profile:  89 yobf  Quit smoking 07/2013  dx of sarcoidosis in 2001 = sob, cough became 02 dep in July [id number] complicated by [facility] and morbid obesity with OHS requiring trach 07/2013    History of Present Illness   January 06, 2010 ov  c/o increased SOB. Pt states outpt clinic advised her to follow up with Dr. Melvyn Novas for Sacoidosis. doe on 02 at 2 lpm sitting then 4 with activity gets off at the curb struggles with grocery store. Try taking Prednisone one half even days in am with breakfast  Be sure you take omeprazole Take one 30-60 min before first and last meals of the day  Stop nifedipine  Start Cardizem 240 mg once daily  Wear 24 hours per day, 2 lpm at rest and sleeping, 4lpm with activity, this is the best way to help your heart function better  CT Chest ( repeated with contrast)> no PE, just sarcoid changes   February 09, 2010 Followup sarcoid w/ PFTs. Breathing is the same- no better or worse.on 02 2 lpm w/in and then outside using 4lpm> cc doe x 50 ft still has to stop every aisle at [employer] but not [facility] parking. rec Start Dulera 2 puffs first thing in am and 2 puffs again in pm about 12 hours later and prednisone 10 mg every other day.        07/24/2013 post hosp f/u ov/Wert re: ? Sarcoid flare/ still smoking  Chief Complaint  Patient presents with  . HFU    Pt states that her breathing is no better since hospital d/c. She reports having to use her o2 all of the time. She has minimal non prod cough.   onset was not  abrupt, sob developed gradually  in setting of gen chest/ upper abd tight sensation anteriorly, sym bilaterally better lying down, not much cough all, no better back on prednisone rec Please see patient coordinator before you leave today  to schedule CTa chest> ? LL PE vs artifact > bialteral venous dopplers > neg / D Dimer nl  Treatment for your abdominal tightness   consists of avoiding foods that cause gas (especially beans and raw vegetables like spinach and salads boiled eggs)  and citrucel 1 heaping tsp twice daily with a large glass of water.  Pain should improve w/in 2 weeks and if not then consider further GI work up.   Wear 02 4lpm 24/7 and stop all smoking  Prednisone 20 mg one daily x 3 days, half daily x 3 days and stop   07/31/2013 f/u ov/Wert re: last pred one day  prior to [place]  Patient presents with  . Follow-up    Pt reports her breathing is starting to improve. Still has minimal non prod cough. No new co's today.  did not start citrucel as rec and main c/o is abd tightness s change in bowel habits rec Wear 02 4lpm 24/7 and stop all smoking      Admit date: 08/08/2013  Discharge date: 08/15/2013  Discharge Diagnoses:  Acute on Chronic Hypercarbic Respiratory Failure  Sarcoid  OSA/OHS  Emergent Tracheostomy  Tobacco Abuse  Long standing non adherence to medical advice , see clinic records  Pulmonary Hypertension  Acute on Chronic dCHF  Hypotension  Acute on Chronic Renal Failure  Dysphagia  Hyperkalemia  Anemia  DM  Acute Encephalopathy  DISCHARGE PLAN BY DIAGNOSIS  Acute on Chronic Hypercarbic Respiratory Failure  Sarcoid  OSA/OHS  Emergent Tracheostomy  Tobacco Abuse  Long standing non adherence to medical advice , see clinic records  Pulmonary Hypertension  Discharge Plan:  -daytime ATC 40% as tolerated, PSV QHS  -prednisone taper - with wean to off over 6 weeks  -smoking cessaton  -Unfortunately expect some vocal cord/ thyroid cartilage damage here per ENT -long term effects on speech unclear, expect will need trach for a long time  -Speech following for PMV (not tol well)  -In regard to [facility], she has scarring on her CXR. Further, before the trach she had OSA. All of these are good secondary causes of pulmonary hypertension and it's really not clear from the literature when patients with PH from  sarcoid benefit from pulmonary vasodilators. Considering what she has been through recently I would prefer to treat the bronchospasm vs "sarcoid flare" (less likely) with a long course of steroids. If not returned to baseline functional status after steroids would set her up for a RHC.  -WOULD NOT DECANNULATE or DOWNSIZE at this time.     Acute on Chronic dCHF  Hypotension  Discharge Plan:  -continue coreg   Acute on Chronic Renal Failure  Discharge Plan:  -trend BMP  Dysphagia  Discharge Plan:  -cleared by SLP for thin liquid, regular diet with chin tuck  -initiate heart healthy, carb modified diet  -PPI  -stop TF 08-30-2022 as pt has passed MBS with SLP, if PO intake is not sufficient, may need to consider supplementation with TF  Hyperkalemia  Discharge Plan:  -resolved, no further interventions  Anemia  Discharge Plan:  -minimize lab draws as able  -transfuse if Hgb <7%, active bleeding or MI <8%  DM  Discharge Plan:  -continue SSI + lantus  -hold home medications: Metformin, 70/30  -consider restart of home meds closer to d/c  Acute Encephalopathy  Discharge Plan:  -resolved, no further needs  -PT efforts  DISCHARGE SUMMARY  Melody Gray is a 43 y.o. y/o female, active smoker with a PMH of sarcoidosis (dx 2001 -followed by dr Melvyn Novas) on home O2 (since 2011), NICM, pulm HTN, OSA, CKD admitted 4/10 by IMTS with worsening SOB thought related to volume overload. Recently, her baseline prednisone had been stopped in March per Dr. Melvyn Novas. She was diuresed with some improvement and was ready for tx to floor. During floor admission, she was noted to have periods of apnea on bipap at night. However, on 4/12 she declined with worsening renal function, hyperkalemia, anemia requiring transfusion, hypercarbia and placed on bipap with transfer to ICU. PCCM consulted for evaluation. She unfortunately did not tolerated BiPAP and had worsening of respiratory status in the setting of acute renal failure.  ABG at that time: 7.1 / 95 / 74 / 34. Despite efforts of bipap, she required emergent intubation in the setting of hypercarbia & worsening mental status. PATIENT WAS A VERY DIFFICULT AIRWAY (she has extremely small mouth, large tongue, and very anterior larynx) - requiring efforts of CCS, Anesthesia, & CCM to obtain a temporary airway. She was then taken to the OR on 4/12 PM for revision / placement of tracheostomy. Post tracheostomy placement, she was returned to ICU. Patient was able to transition to ATC during day time and nocturnal vent support at night. Thus far, she has not been successful with PSV at night. Has required PRVC.  In the setting of acute renal failure  reltated to ARB + diuresis, her home medications were held and have not been restarted. Offending agents were held, patient was gently hydrated and sr cr has returned to normal.  She was evaluated by SLP for swallowing needs. Patient passed MBS on 4/17 and was cleared for regular consistency diet, thin liquids with chin tuck. Please see discharge plans as above for details.  For Sarcoid, would plan to treat with prednisone tapered from 40mg  down to off over 6 weeks.  Regarding tracheostomy, it is helping her OSA and given her difficult airway would not consider decannulation for minimum 3-4 weeks. May want to consider lifelong trach to assist with OSA.     6/10/2015post hosp  f/u ov/ transition of care/Wert re: sarcoid / pred at 10 mg / ? Not smoking ? / now trached Chief Complaint  Patient presents with  . Follow-up    Pt states her breathing has improved. No new co's today.   did not understand purpose of PMV leaves it in 24/7 and not using T collar at hs  Edema has resolved  rec Leave the purple plug out while sleeping with humidifed 02 collar in place Prednisone 10 mg daily     11/19/2013 f/u ov/Wert re: chronic resp failure/ stopped smoking at trach/ confused with trach instrucitons Chief Complaint  Patient presents with   . Follow-up    Pt reports had to go to ED 11/17/13 due to low o2 sats. She c/o slight increase in SOB for the past wk.     2lpm NP with PMV 2lpm with trach collar but feels it makes her noct cough worse, min mucoid trach secretions No ent f/u since trach placed. >>no changes   12/02/2013 [facility] follow up  Patient returns for a post hospital followup. Patient is admitted July 24 of the 29th for acute on chronic respiratory failure with presumed pneumonia. Sputum cultures returned with MSSA . She was treated with IV antibiotics, and transitioned to Levaquin. Prior to discharge. She's also treated with a prednisone taper with instructions to decrease down to 10 mg daily. Did require diuresis . Her component of fluid overload. Her metformin, and ARB were held do to renal insufficiency She has follow with her primary care doctor with followup labs.  Since discharge. She is improved with decreased cough, congestion, and wheezing. O2 saturations are improved, and she is back to her baseline oxygen demands, at home, with 3 L of nasal cannula with trach plugged and 2 L on trach collar at bedtime  01/29/2014 Acute OV  Complains of prod cough with green/yellow mucus, wheezing, increased SOB, some tightness in upper airways, head congestion w/ PND and clear/yellow mucus x2 days.  Legs more puffy last few days. On lasix 40mg . daily . Currently on prednisone 10mg  daily .  Denies any f/c/s, n/v/d, hemoptysis or orthopnea.  No recent travel . No calf pain.  [name] has been doing well, coughs up thick mucus w/ green mucus.     ROS  The following are not active complaints unless bolded sore throat, dysphagia, dental problems, itching, sneezing,  nasal congestion or excess/ purulent secretions, ear ache,   fever, chills, sweats, unintended wt loss, pleuritic or exertional cp, hemoptysis,  orthopnea pnd or leg swelling, presyncope, palpitations, heartburn, abdominal pain, anorexia, nausea, vomiting,  diarrhea  or change in bowel or urinary habits, change in stools or urine, dysuria,hematuria,  rash, arthralgias, visual complaints, headache, numbness weakness or ataxia or problems with walking or coordination,  change in  mood/affect or memory.         Past Medical History:  Sarcoidosis (Dr Wert)-w/ liver involvement per biopsy 03/2008 - Reversible airway component so start [facility] 01/2010 > better  - HFa 75% p coaching May 04, 2010  -  Off chronic prednisone 05/2011> restarted 07/2013  Unexplained Hypoxemia July 2011  - CT angiogram 01/07/10 >>> no PE  - PFT's February 09, 2010 FEV1 1.20 (49%) with ratio 72 and FRC 90%, FEV1  16% better p B2, DLC0 33% > corrects to 84   Morbid obesity  - Target wt = 153 for BMI < 30  - Trach 08/10/13 with very difficult airway([place]) QT prolongation  Diabetes mellitus, type II  Hypertension  Heart catheterization 06-06-06 : No CAD, no RAS, normal EF  Hx eclampsia  Hx of scalp seborrheic dermatitis            Objective:   Physical Exam  She is a minimally  cushingoid-appearing amb bf with trach in place no stridor and good phonation with PMV wt 238 January 06, 2010 >   217 May 04, 2010 >  02/12/2012  213 >  215 04/04/2012  > 239 04/08/2013 > 07/24/2013 249 > 07/31/2013  252 >   10/08/2013  233> 11/19/2013  242 >245 12/02/2013 >241 01/29/2014        GEN: A/Ox3; pleasant , NAD  HEENT:  [place]/AT,  EACs-clear, TMs-wnl, NOSE-clear, THROAT-clear, no lesions, no postnasal drip or exudate noted.   NECK:  Supple w/ fair ROM; no JVD; normal carotid impulses w/o bruits; no thyromegaly or nodules palpated; no lymphadenopathy.Trach midline, no redness, dsg clean and dry   RESP  Decreased BS in bases, no accessory muscle use, no dullness to percussion  CARD:  RRR, no m/r/g  , 1+peripheral edema, pulses intact, no cyanosis or clubbing.  GI:   Soft & nt; nml bowel sounds; no organomegaly or masses detected.  Musco: Warm bil, no deformities or joint swelling noted.    Neuro: alert, no focal deficits noted.    Skin: Warm, no lesions or rashes        Assessment:

## 2014-01-29 NOTE — Assessment & Plan Note (Signed)
Flare with URI/Bronchitis  Gentle diuresis to avoid fluid overload with hx of DD .   Plan  Augmentin 875mg  .Twice daily  For 7 days -take with food.  Eat yogurt while on antibiotics  Mucinex DM Twice daily  As needed  Cough/congestion  Fluids and rest  Increase Prednisone 20mg  daily for 5 days then back to 10mg  daily  Extra Lasix 40mg  daily for 2 days , eat extra bananas  Please contact office for sooner follow up if symptoms do not improve or worsen or seek emergency care  Follow up Dr. Melvyn Novas  2-3 weeks and As needed   Chest xray today .

## 2014-01-29 NOTE — Patient Instructions (Signed)
Augmentin 875mg  .Twice daily  For 7 days -take with food.  Eat yogurt while on antibiotics  Mucinex DM Twice daily  As needed  Cough/congestion  Fluids and rest  Increase Prednisone 20mg  daily for 5 days then back to 10mg  daily  Extra Lasix 40mg  daily for 2 days , eat extra bananas  Please contact office for sooner follow up if symptoms do not improve or worsen or seek emergency care  Follow up Dr. Melvyn Novas  2-3 weeks and As needed   Chest xray today .

## 2014-02-05 ENCOUNTER — Encounter: Payer: Self-pay | Admitting: Cardiology

## 2014-02-05 ENCOUNTER — Ambulatory Visit (INDEPENDENT_AMBULATORY_CARE_PROVIDER_SITE_OTHER): Payer: Medicare Other | Admitting: Cardiology

## 2014-02-05 VITALS — BP 151/96 | HR 121 | Ht 61.0 in | Wt 243.5 lb

## 2014-02-05 DIAGNOSIS — E785 Hyperlipidemia, unspecified: Secondary | ICD-10-CM

## 2014-02-05 DIAGNOSIS — D869 Sarcoidosis, unspecified: Secondary | ICD-10-CM

## 2014-02-05 DIAGNOSIS — I2729 Other secondary pulmonary hypertension: Secondary | ICD-10-CM

## 2014-02-05 DIAGNOSIS — I10 Essential (primary) hypertension: Secondary | ICD-10-CM

## 2014-02-05 DIAGNOSIS — I272 Other secondary pulmonary hypertension: Secondary | ICD-10-CM | POA: Diagnosis not present

## 2014-02-05 MED ORDER — CARVEDILOL 12.5 MG PO TABS: 12.5000 mg | ORAL_TABLET | Freq: Two times a day (BID) | ORAL | Status: DC

## 2014-02-05 NOTE — Assessment & Plan Note (Signed)
Improved on most recent echocardiogram. Continue ARB. Continue carvedilol but increase to 12.5 mg by mouth twice a day.

## 2014-02-05 NOTE — Assessment & Plan Note (Signed)
Blood pressure elevated. Increase carvedilol to 12.5 mg by mouth twice a day.

## 2014-02-05 NOTE — Progress Notes (Signed)
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 I initially saw in July of 2012 for evaluation of abnormal echocardiogram. Echocardiogram in June of 2012 revealed an ejection fraction of 35% which was new. There was mild biatrial enlargement, mild right ventricular enlargement and mildly reduced RV function. Cardiac MRI in July of 2012 revealed moderate to severe LV systolic dysfunction, EF [id number]. The anterior and anterolateral walls appeared severely hypokinetic. Mild RV dilation with mild systolic dysfunction. Possible small areas of mid wall delayed enhancement in the apical septal and apical lateral walls. These areas were very faint and not definitely of clinical significance. Mid wall enhancement can be found in infiltrative cardiomyopathies such as that due to sarcoidosis. TSH and ferritin normal. Cardiac catheterization repeated in September of 2012 and showed an ejection fraction of 40%. There was no coronary disease. Echo repeated in June 2015 and showed normal LV function, grade 2 diastolic dysfunction, mild LAE, mild RAE; mild RVE with reduced function. Lower ext dopplers 8/15 showed no DVT. Patient has sarcoid. Since I last saw her She has some dyspnea on exertion but stable. No chest pain. Occasional minimal pedal edema. No palpitations or syncope.   Current Outpatient Prescriptions  Medication Sig Dispense Refill  . albuterol (PROVENTIL HFA;VENTOLIN HFA) 108 (90 BASE) MCG/ACT inhaler Inhale 2 puffs into the lungs every 6 (six) hours as needed for wheezing or shortness of breath.  1 Inhaler  2  . amLODipine (NORVASC) 5 MG tablet Take 1 tablet (5 mg total) by mouth daily.  30 tablet  5  . amoxicillin-clavulanate (AUGMENTIN) 875-125 MG per tablet Take 1 tablet by mouth 2 (two) times daily.  14 tablet  0  . atorvastatin (LIPITOR) 20 MG tablet Take 1 tablet (20 mg total) by mouth daily at 6 PM.  90 tablet  3  . carvedilol (COREG) 6.25 MG tablet Take 1 tablet (6.25 mg  total) by mouth 2 (two) times daily.  180 tablet  3  . Dextromethorphan-Guaifenesin (MUCINEX DM MAXIMUM STRENGTH) 60-1200 MG TB12 One every 12 hours as needed for cough      . ferrous sulfate 325 (65 FE) MG tablet Take 1 tablet (325 mg total) by mouth 3 (three) times daily with meals.  90 tablet  3  . furosemide (LASIX) 40 MG tablet Take 40 mg by mouth daily.      . insulin aspart (NOVOLOG) 100 UNIT/ML injection Take novolog per sliding scale: CBG 70-120: 0 units  CBG 121-150: 1 unit  CBG 151-200: 2 units  CBG 201-250: 3 units  CBG 251-300: 5 units  CBG 301-350: 7 units  CBG 351-400: 9 units  CBG > 400: call MD  30 mL  3  . insulin detemir (LEVEMIR) 100 UNIT/ML injection 26 units every morning and 20 units every night. diag code 250.00      . irbesartan (AVAPRO) 150 MG tablet Take 1 tablet by mouth daily.      . Melatonin 3 MG TABS Take 1 tablet (3 mg total) by mouth at bedtime as needed.    0  . metFORMIN (GLUCOPHAGE) 1000 MG tablet Take 1 tablet by mouth 2 (two) times daily.      Marland Kitchen omeprazole (PRILOSEC) 20 MG capsule Take 20 mg by mouth daily.      . predniSONE (DELTASONE) 10 MG tablet take 1 tablet by mouth once daily WITH BREAKFAST  30 tablet  2   No current facility-administered medications for this visit.  Past Medical History  Diagnosis Date  . Sarcoidosis     Followed by Dr. Melvyn Novas; w/ liver involvement per biopsy 12/09, Reversible airway component so started on [facility] 01/2010; HFA 75% p coaching 05/2010  . Hypoxemia     CT angiogram 9/11>> No PE; PFTs 10/11- FEV1 1.20 (49%) with 16% better p B2, DLCO 33%> corrects to 84; O2 sats ok on 4 lpm X rapid walk X 3 laps 05/2010  . Morbid obesity     Target wt= 153 for BMI <30  . QT prolongation   . Diabetes mellitus   . Hypertension   . Hx of cardiac cath 2/08    No CAD, no RAS,  normal EF  . Seborrheic dermatitis of scalp   . 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 Hep B serology neg. but Hep C ab  +, HIV neg. 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 Cardiomyopathy, nonischemic     EF 45% 12/10; Echo 7/11 normal EF, PAS 48  . Diabetic retinopathy     Right eye 2/11  . Health maintenance examination     Mammogram 05/2010 Negative; Last Pap smear 03/2008; Last DM eye exam 2/11> mild non-proliferative diabetic retinopathy. OD  . Helicobacter pylori ab+ 05/2011    Pt was symptomatic and treatment planned for 05/2011  . CHF (congestive heart failure)     Past Surgical History  Procedure Laterality Date  . Tubal ligation    . Breast surgery    . Cesarean section    . Tracheostomy tube placement N/A 08/10/2013    Procedure: TRACHEOSTOMY;  Surgeon: Melissa Montane, MD;  Location: [facility];  Service: ENT;  Laterality: N/A;    History   Social History  . Marital Status: Single    Spouse Name: N/A    Number of Children: 2  . Years of Education: N/A   Occupational History  . 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 Smoking status: Former Smoker -- 0.30 packs/day for 20 years    Quit date: 06/29/2013  . Smokeless tobacco: Never Used     Comment: Needs to cut back.  . Alcohol Use: No  . Drug Use: No  . Sexual Activity: Not on file   Other Topics Concern  . Not on file   Social History Narrative   Diabetic card given 05/03/2010.   Financial assistance approved for 100% discount at [facility] and has [facility] card. [name] 12/07/2009.      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    ROS: no fevers or chills, productive cough, hemoptysis, dysphasia, odynophagia, melena, hematochezia, dysuria, hematuria, rash, seizure activity, orthopnea, PND, claudication. Remaining systems are negative.  Physical Exam: Well-developed obese in no acute distress.  Skin is warm and dry.  HEENT is normal.  Neck is supple.  Chest is clear to auscultation with normal expansion.  Cardiovascular exam is regular  and tachycardic Abdominal exam nontender or distended. No masses palpated. Extremities show no edema. neuro grossly intact  ECG 01/02/2014-sinus rhythm, Left axis deviation, left ventricular hypertrophy, cannot rule out prior anterior infarct.

## 2014-02-05 NOTE — Assessment & Plan Note (Signed)
Counseled on weight loss. 

## 2014-02-05 NOTE — Assessment & Plan Note (Signed)
Continue statin. 

## 2014-02-05 NOTE — Progress Notes (Signed)
Quick Note:  Called spoke with patient, advised of cxr results / recs as stated by TP. Pt verbalized her understanding and denied any questions. ______ 

## 2014-02-05 NOTE — Patient Instructions (Signed)
Your physician wants you to follow-up in: [place] will receive a reminder letter in the mail two months in advance. If you don't receive a letter, please call our office to schedule the follow-up appointment.   INCREASE CARVEDILOL TO 12.5 MG TWICE DAILY= 2 OF THE 6.25MG  TABLETS TWICE DAILY

## 2014-02-05 NOTE — Assessment & Plan Note (Signed)
Euvolemic on examination.Continue present dose of Lasix. 

## 2014-02-05 NOTE — Assessment & Plan Note (Signed)
Followed by pulmonary 

## 2014-02-10 ENCOUNTER — Ambulatory Visit (HOSPITAL_COMMUNITY)
Admission: RE | Admit: 2014-02-10 | Discharge: 2014-02-10 | Disposition: A | Discharge status: Home / Self Care | Payer: Medicare Other | Source: Ambulatory Visit | Attending: Internal Medicine | Admitting: Internal Medicine

## 2014-02-10 ENCOUNTER — Other Ambulatory Visit: Payer: Self-pay | Admitting: Acute Care

## 2014-02-10 DIAGNOSIS — I429 Cardiomyopathy, unspecified: Secondary | ICD-10-CM | POA: Insufficient documentation

## 2014-02-10 DIAGNOSIS — E11319 Type 2 diabetes mellitus with unspecified diabetic retinopathy without macular edema: Secondary | ICD-10-CM | POA: Insufficient documentation

## 2014-02-10 DIAGNOSIS — I509 Heart failure, unspecified: Secondary | ICD-10-CM | POA: Diagnosis not present

## 2014-02-10 DIAGNOSIS — J80 Acute respiratory distress syndrome: Secondary | ICD-10-CM | POA: Insufficient documentation

## 2014-02-10 DIAGNOSIS — Z9119 Patient's noncompliance with other medical treatment and regimen: Secondary | ICD-10-CM | POA: Insufficient documentation

## 2014-02-10 DIAGNOSIS — Z930 Tracheostomy status: Secondary | ICD-10-CM

## 2014-02-10 DIAGNOSIS — Z720 Tobacco use: Secondary | ICD-10-CM | POA: Insufficient documentation

## 2014-02-10 DIAGNOSIS — Z885 Allergy status to narcotic agent status: Secondary | ICD-10-CM | POA: Insufficient documentation

## 2014-02-10 DIAGNOSIS — Z430 Encounter for attention to tracheostomy: Secondary | ICD-10-CM | POA: Insufficient documentation

## 2014-02-10 DIAGNOSIS — D869 Sarcoidosis, unspecified: Secondary | ICD-10-CM | POA: Diagnosis not present

## 2014-02-10 DIAGNOSIS — G4733 Obstructive sleep apnea (adult) (pediatric): Secondary | ICD-10-CM | POA: Insufficient documentation

## 2014-02-10 DIAGNOSIS — J9612 Chronic respiratory failure with hypercapnia: Secondary | ICD-10-CM

## 2014-02-10 NOTE — Progress Notes (Signed)
Trach Clinic Note:  Pt arrives at this time for follow up visit to trach clinic. #[id number] secure with PMV in place,  on 2L [place].  Trach changed by [name]. Same size trach used for change. No complications. Pt tolerated well, no distress noted. + ETCO2 color change. =BBS.  Pre VS HR 105, Spo2 97%, 182/105  Post VS HR 101, Spo2 97% , BP 158/96  Pt to come back for follow up X36months per [name].

## 2014-02-10 NOTE — Progress Notes (Addendum)
Brief History  This is a 43 year old female f/b Dr Melvyn Novas for chronic respiratory failure in the setting of OHS/OSA,  Sarcoidosis and secondary PAH. Has a long standing h/o tobacco abuse and medical non-adherence. She was emergently trached on 07/2013 due to hypercarbic respiratory failure by ENT after anesthesia was unable to intubate her. Patient did well until July when she had another episode and trach was changed from cuffless to cuffed. Prior to discharge was again changed to a cuffless 6 shiley. Patient coming to see [name] in the trach clinic for routine trach change.   Past Medical History  Diagnosis Date  . Sarcoidosis     Followed by Dr. Melvyn Novas; w/ liver involvement per biopsy 12/09, Reversible airway component so started on [facility] 01/2010; HFA 75% p coaching 05/2010  . Hypoxemia     CT angiogram 9/11>> No PE; PFTs 10/11- FEV1 1.20 (49%) with 16% better p B2, DLCO 33%> corrects to 84; O2 sats ok on 4 lpm X rapid walk X 3 laps 05/2010  . Morbid obesity     Target wt= 153 for BMI <30  . QT prolongation   . Diabetes mellitus   . Hypertension   . Hx of cardiac cath 2/08    No CAD, no RAS,  normal EF  . Seborrheic dermatitis of scalp   . 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 Hep B serology neg. but Hep C ab +, HIV neg. 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 Cardiomyopathy, nonischemic     EF 45% 12/10; Echo 7/11 normal EF, PAS 48  . Diabetic retinopathy     Right eye 2/11  . Health maintenance examination     Mammogram 05/2010 Negative; Last Pap smear 03/2008; Last DM eye exam 2/11> mild non-proliferative diabetic retinopathy. OD  . Helicobacter pylori ab+ 05/2011    Pt was symptomatic and treatment planned for 05/2011  . CHF (congestive heart failure)    Family History  Problem Relation Age of Onset  . Cancer Mother     colon cancer  . Multiple sclerosis Father   . Asthma Sister     in childhood  .  Diabetes Father   . Hypertension     Allergies  Allergen Reactions  . Vicodin [Hydrocodone-Acetaminophen] Itching   History   Social History  . Marital Status: Single    Spouse Name: N/A    Number of Children: 2  . Years of Education: N/A   Occupational History  . 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 Smoking status: Former Smoker -- 0.30 packs/day for 20 years    Quit date: 06/29/2013  . Smokeless tobacco: Never Used     Comment: Needs to cut back.  . Alcohol Use: No  . Drug Use: No  . Sexual Activity: Not on file   Other Topics Concern  . Not on file   Social History Narrative   Diabetic card given 05/03/2010.   Financial assistance approved for 100% discount at [facility] and has [facility] card. [name] 12/07/2009.      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   @[employer] @  Review of Systems:   Bolds are positive  Constitutional: weight loss, gain, night sweats, Fevers, chills, fatigue .  HEENT: headaches, Sore throat, sneezing, nasal congestion, post nasal  drip, Difficulty swallowing, Tooth/dental problems, visual complaints visual changes, ear ache CV:  chest pain, radiates: ,Orthopnea, PND, swelling in lower extremities, dizziness, palpitations, syncope.  GI  heartburn, indigestion, abdominal pain, nausea, vomiting, diarrhea, change in bowel habits, loss of appetite, bloody stools.  Resp: cough, productive: , hemoptysis, dyspnea, chest pain, pleuritic.  Skin: rash or itching or icterus GU: dysuria, change in color of urine, urgency or frequency. flank pain, hematuria  MS: joint pain or swelling. decreased range of motion  Psych: change in mood or affect. depression or anxiety.  Neuro: difficulty with speech, weakness, numbness, ataxia   LMP 01/29/2014  General appearance: 43 Year old female Mouth:  membranes and no mucosal ulcerations; normal hard and soft palate Neck: Trachea midline, # 6 trach, cuffless trach  stoma . Good phonation w/ PMV  Lungs: CTA, with normal respiratory effort and no intercostal retractions CV: RRR, no MRGs  Abdomen: Soft, non-tender; no masses or HSM Extremities: No peripheral edema or extremity lymphadenopathy Skin: Normal temperature, turgor and texture; no rash, ulcers or subcutaneous nodules Psych: Appropriate affect, alert and oriented to person, place and time  Date  Seen by:  [name] size  Trach change pmv  Capping  Stoma site Diet  Goals   7/29 yacoub 6 cuffless no       10/13 babcock 6 shiley cufless  yes yes n/a Unremarkable  Regular  Continue day time trach w/ PMV  No decannulation.                                  Impression  43 year old with morbid obesity, OHS/OSA, sarcoid, secondary PAH and heart failure. Trached emergently for hypercarbic respiratory failure. Not a candidate for decannulation   Discussion Long discussion about down size vs keeping current trach. She understands that we are planning to keep the trach. She recently had URI w/ copious secretions that almost occluded her trach. Little to be gained by down sizing, especially as she can eat and phonate well.   Plan:  - cont 6 cuffless  - ROV 3 months.   [name] ACNP-BC [place] Pager # [phone] OR # [phone] if no answer

## 2014-02-12 ENCOUNTER — Ambulatory Visit: Payer: Medicare Other | Admitting: Internal Medicine

## 2014-02-16 ENCOUNTER — Encounter: Payer: Self-pay | Admitting: Internal Medicine

## 2014-02-16 ENCOUNTER — Ambulatory Visit (INDEPENDENT_AMBULATORY_CARE_PROVIDER_SITE_OTHER): Payer: Medicare Other | Admitting: Internal Medicine

## 2014-02-16 VITALS — BP 112/66 | HR 84 | Temp 99.1°F | Ht 61.0 in | Wt 250.0 lb

## 2014-02-16 DIAGNOSIS — Z23 Encounter for immunization: Secondary | ICD-10-CM

## 2014-02-16 DIAGNOSIS — J9612 Chronic respiratory failure with hypercapnia: Secondary | ICD-10-CM | POA: Diagnosis not present

## 2014-02-16 DIAGNOSIS — D869 Sarcoidosis, unspecified: Secondary | ICD-10-CM

## 2014-02-16 NOTE — Progress Notes (Signed)
Subjective:     Patient ID: Melody Gray, female   DOB: 1970/05/24   MRN: [id number]   Brief patient profile:  83  yobf  Quit smoking 07/2013  dx of sarcoidosis in 2001 = sob, cough became 02 dep in July [id number] complicated by [facility] and morbid obesity with OHS requiring trach 07/2013    History of Present Illness   January 06, 2010 ov  c/o increased SOB. Pt states outpt clinic advised her to follow up with Dr. Melvyn Novas for Sacoidosis. doe on 02 at 2 lpm sitting then 4 with activity gets off at the curb struggles with grocery store. Try taking Prednisone one half even days in am with breakfast  Be sure you take omeprazole Take one 30-60 min before first and last meals of the day  Stop nifedipine  Start Cardizem 240 mg once daily  Wear 24 hours per day, 2 lpm at rest and sleeping, 4lpm with activity, this is the best way to help your heart function better  CT Chest ( repeated with contrast)> no PE, just sarcoid changes   February 09, 2010 Followup sarcoid w/ PFTs. Breathing is the same- no better or worse.on 02 2 lpm w/in and then outside using 4lpm> cc doe x 50 ft still has to stop every aisle at [employer] but not [facility] parking. rec Start Dulera 2 puffs first thing in am and 2 puffs again in pm about 12 hours later and prednisone 10 mg every other day.        07/24/2013 post hosp f/u ov/Melody Gray re: ? Sarcoid flare/ still smoking  Chief Complaint  Patient presents with  . HFU    Pt states that her breathing is no better since hospital d/c. She reports having to use her o2 all of the time. She has minimal non prod cough.   onset was not  abrupt, sob developed gradually  in setting of gen chest/ upper abd tight sensation anteriorly, sym bilaterally better lying down, not much cough all, no better back on prednisone rec Please see patient coordinator before you leave today  to schedule CTa chest> ? LL PE vs artifact > bialteral venous dopplers > neg / D Dimer nl  Treatment for your abdominal tightness   consists of avoiding foods that cause gas (especially beans and raw vegetables like spinach and salads boiled eggs)  and citrucel 1 heaping tsp twice daily with a large glass of water.  Pain should improve w/in 2 weeks and if not then consider further GI work up.   Wear 02 4lpm 24/7 and stop all smoking  Prednisone 20 mg one daily x 3 days, half daily x 3 days and stop   07/31/2013 f/u ov/Melody Gray re: last pred one day  prior to [place]  Patient presents with  . Follow-up    Pt reports her breathing is starting to improve. Still has minimal non prod cough. No new co's today.  did not start citrucel as rec and main c/o is abd tightness s change in bowel habits rec Wear 02 4lpm 24/7 and stop all smoking      Admit date: 08/08/2013  Discharge date: 08/15/2013  Discharge Diagnoses:  Acute on Chronic Hypercarbic Respiratory Failure  Sarcoid  OSA/OHS  Emergent Tracheostomy  Tobacco Abuse  Long standing non adherence to medical advice , see clinic records  Pulmonary Hypertension  Acute on Chronic dCHF  Hypotension  Acute on Chronic Renal Failure  Dysphagia  Hyperkalemia  Anemia  DM  Acute Encephalopathy  DISCHARGE PLAN BY DIAGNOSIS  Acute on Chronic Hypercarbic Respiratory Failure  Sarcoid  OSA/OHS  Emergent Tracheostomy  Tobacco Abuse  Long standing non adherence to medical advice , see clinic records  Pulmonary Hypertension  Discharge Plan:  -daytime ATC 40% as tolerated, PSV QHS  -prednisone taper - with wean to off over 6 weeks  -smoking cessaton  -Unfortunately expect some vocal cord/ thyroid cartilage damage here per ENT -long term effects on speech unclear, expect will need trach for a long time  -Speech following for PMV (not tol well)  -In regard to [facility], she has scarring on her CXR. Further, before the trach she had OSA. All of these are good secondary causes of pulmonary hypertension and it's really not clear from the literature when patients with PH from  sarcoid benefit from pulmonary vasodilators. Considering what she has been through recently I would prefer to treat the bronchospasm vs "sarcoid flare" (less likely) with a long course of steroids. If not returned to baseline functional status after steroids would set her up for a RHC.  -WOULD NOT DECANNULATE or DOWNSIZE at this time.     Acute on Chronic dCHF  Hypotension  Discharge Plan:  -continue coreg   Acute on Chronic Renal Failure  Discharge Plan:  -trend BMP  Dysphagia  Discharge Plan:  -cleared by SLP for thin liquid, regular diet with chin tuck  -initiate heart healthy, carb modified diet  -PPI  -stop TF 09/06/22 as pt has passed MBS with SLP, if PO intake is not sufficient, may need to consider supplementation with TF  Hyperkalemia  Discharge Plan:  -resolved, no further interventions  Anemia  Discharge Plan:  -minimize lab draws as able  -transfuse if Hgb <7%, active bleeding or MI <8%  DM  Discharge Plan:  -continue SSI + lantus  -hold home medications: Metformin, 70/30  -consider restart of home meds closer to d/c  Acute Encephalopathy  Discharge Plan:  -resolved, no further needs  -PT efforts  DISCHARGE SUMMARY  Melody Gray is a 43 y.o. y/o female, active smoker with a PMH of sarcoidosis (dx 2001 -followed by dr Melvyn Novas) on home O2 (since 2011), NICM, pulm HTN, OSA, CKD admitted 4/10 by IMTS with worsening SOB thought related to volume overload. Recently, her baseline prednisone had been stopped in March per Dr. Melvyn Novas. She was diuresed with some improvement and was ready for tx to floor. During floor admission, she was noted to have periods of apnea on bipap at night. However, on 4/12 she declined with worsening renal function, hyperkalemia, anemia requiring transfusion, hypercarbia and placed on bipap with transfer to ICU. PCCM consulted for evaluation. She unfortunately did not tolerated BiPAP and had worsening of respiratory status in the setting of acute renal failure.  ABG at that time: 7.1 / 95 / 74 / 34. Despite efforts of bipap, she required emergent intubation in the setting of hypercarbia & worsening mental status. PATIENT WAS A VERY DIFFICULT AIRWAY (she has extremely small mouth, large tongue, and very anterior larynx) - requiring efforts of CCS, Anesthesia, & CCM to obtain a temporary airway. She was then taken to the OR on 4/12 PM for revision / placement of tracheostomy. Post tracheostomy placement, she was returned to ICU. Patient was able to transition to ATC during day time and nocturnal vent support at night. Thus far, she has not been successful with PSV at night. Has required PRVC.  In the setting of acute renal failure  reltated to ARB + diuresis, her home medications were held and have not been restarted. Offending agents were held, patient was gently hydrated and sr cr has returned to normal.  She was evaluated by SLP for swallowing needs. Patient passed MBS on 4/17 and was cleared for regular consistency diet, thin liquids with chin tuck. Please see discharge plans as above for details.  For Sarcoid, would plan to treat with prednisone tapered from 40mg  down to off over 6 weeks.  Regarding tracheostomy, it is helping her OSA and given her difficult airway would not consider decannulation for minimum 3-4 weeks. May want to consider lifelong trach to assist with OSA.     6/10/2015post hosp  f/u ov/ transition of care/Melody Gray re: sarcoid / pred at 10 mg / ? Not smoking ? / now trached Chief Complaint  Patient presents with  . Follow-up    Pt states her breathing has improved. No new co's today.   did not understand purpose of PMV leaves it in 24/7 and not using T collar at hs  Edema has resolved  rec Leave the purple plug out while sleeping with humidifed 02 collar in place Prednisone 10 mg daily     11/19/2013 f/u ov/Melody Gray re: chronic resp failure/ stopped smoking at trach/ confused with trach instrucitons Chief Complaint  Patient presents with   . Follow-up    Pt reports had to go to ED 11/17/13 due to low o2 sats. She c/o slight increase in SOB for the past wk.    2lpm Gray with PMV 2lpm with trach collar but feels it makes her noct cough worse, min mucoid trach secretions No ent f/u since trach placed. >> For cough > mucinex dm up 1200 mg every 12 hours as needed  Add pepcid 20 mg and chlortrimeton (over the counter) 4 mg at bedtime to see if helps cough at night  Continue omeprazole 20 mg Take 30-60 min before first meal of the day  GERD Please see patient coordinator before you leave today  to schedule Melody Gray  See Melody Gray in 4  weeks with all your medication   12/02/2013 [facility] follow up / Gray Patient returns for a post hospital followup. Patient is admitted July 24 of the 29th for acute on chronic respiratory failure with presumed pneumonia. Sputum cultures returned with MSSA . She was treated with IV antibiotics, and transitioned to Levaquin. Prior to discharge. She's also treated with a prednisone taper with instructions to decrease down to 10 mg daily. Did require diuresis . Her component of fluid overload. Her metformin, and ARB were held do to renal insufficiency She has follow with her primary care doctor with followup labs.  Since discharge. She is improved with decreased cough, congestion, and wheezing. O2 saturations are improved, and she is back to her baseline oxygen demands, at home, with 3 L of nasal cannula with trach plugged and 2 L on trach collar at bedtime rec no change rx   01/29/2014 Acute OV /Gray Complains of prod cough with green/yellow mucus, wheezing, increased SOB, some tightness in upper airways, head congestion w/ PND and clear/yellow mucus x2 days.  Legs more puffy last few days. On lasix 40mg . daily . Currently on prednisone 10mg  daily .  rec Augmentin 875mg  .Twice daily  For 7 days -take with food.  Eat yogurt while on antibiotics  Mucinex DM Twice daily  As needed  Cough/congestion  Fluids  and rest  Increase Prednisone 20mg  daily for 5 days then  back to 10mg  daily  Extra Lasix 40mg  daily for 2 days , eat extra bananas  Please contact office for sooner follow up if symptoms do not improve or worsen or seek emergency care  Follow up Dr. Melvyn Novas  2-3 weeks and As needed   Chest xray today .    02/16/2014 f/u ov/Melody Gray re: sarcoid on pred 10 mg daily  Chief Complaint  Patient presents with  . Follow-up    Pt states that her cough is some better- still prod but with clear sputum.  Her breathing is improved back to her normal baseline.    amb on 02 2lpm can do HT s HC parking  No obvious day to day or daytime variabilty or assoc   cp or chest tightness, subjective wheeze overt sinus or hb symptoms. No unusual exp hx or h/o childhood pna/ asthma or knowledge of premature birth.  Sleeping ok on t collar without nocturnal  or early am exacerbation  of respiratory  c/o's or need for noct saba. Also denies any obvious fluctuation of symptoms with weather or environmental changes or other aggravating or alleviating factors except as outlined above   Current Medications, Allergies, Complete Past Medical History, Past Surgical History, Family History, and Social History were reviewed in [employer] record.  ROS  The following are not active complaints unless bolded sore throat, dysphagia, dental problems, itching, sneezing,  nasal congestion or excess/ purulent secretions, ear ache,   fever, chills, sweats, unintended wt loss, pleuritic or exertional cp, hemoptysis,  orthopnea pnd or leg swelling, presyncope, palpitations, heartburn, abdominal pain, anorexia, nausea, vomiting, diarrhea  or change in bowel or urinary habits, change in stools or urine, dysuria,hematuria,  rash, arthralgias, visual complaints, headache, numbness weakness or ataxia or problems with walking or coordination,  change in mood/affect or memory.                        Past Medical History:   Sarcoidosis (Dr Adaiah Jaskot)-w/ liver involvement per biopsy 03/2008 - Reversible airway component so start [facility] 01/2010 > better  - HFa 75% p coaching May 04, 2010  -  Off chronic prednisone 05/2011> restarted 07/2013  Unexplained Hypoxemia July 2011  - CT angiogram 01/07/10 >>> no PE  - PFT's February 09, 2010 FEV1 1.20 (49%) with ratio 72 and FRC 90%, FEV1  16% better p B2, DLC0 33% > corrects to 84   Morbid obesity  - Target wt = 153 for BMI < 30  - Trach 08/10/13 with very difficult airway([place]) QT prolongation  Diabetes mellitus, type II  Hypertension  Heart catheterization 06-06-06 : No CAD, no RAS, normal EF  Hx eclampsia  Hx of scalp seborrheic dermatitis            Objective:   Physical Exam  She is a minimally  cushingoid-appearing amb bf with trach in place min stridor and good phonation with PMV in place, clear with pmv out   wt 238 January 06, 2010 >   217 May 04, 2010 >  02/12/2012  213 >  215 04/04/2012  > 239 04/08/2013 > 07/24/2013 249 > 07/31/2013  252 >   10/08/2013  233> 11/19/2013  242 >245 12/02/2013 >241 01/29/2014 > 02/16/2014  250        GEN: A/Ox3; pleasant , NAD  HEENT:  [place]/AT,  EACs-clear, TMs-wnl, NOSE-clear, THROAT-clear, no lesions, no postnasal drip or exudate noted.   NECK:  Supple w/ fair ROM; no  JVD; normal carotid impulses w/o bruits; no thyromegaly or nodules palpated; no lymphadenopathy.Trach midline, no redness, dsg clean and dry   RESP  Decreased BS in bases, no accessory muscle use, no dullness to percussion  CARD:  RRR, no m/r/g,   1+peripheral edema, pulses intact, no cyanosis or clubbing.  GI:   Soft & nt; nml bowel sounds; no organomegaly or masses detected.  Musco: Warm bil, no deformities or joint swelling noted.   Neuro: alert, no focal deficits noted.    Skin: Warm, no lesions or rashes        Assessment:

## 2014-02-16 NOTE — Patient Instructions (Addendum)
Try alternating 10 mg with 5 mg prednsione daily   Walk on 02 as much as you can, ideally 30 min daily   Be sure your 02 for your T collar is humidified - call Libby at [phone] if any problems with the [place]  Please schedule a follow up visit in 3 months but call sooner if needed

## 2014-02-17 ENCOUNTER — Ambulatory Visit: Payer: Medicare Other | Admitting: Internal Medicine

## 2014-02-17 NOTE — Assessment & Plan Note (Addendum)
-   PFT's 11/21/2011 FEV1 1.23 (50%) ratio 67 and no better p B2,  DLCO 31 corrects to 73   - trial off chronic prednisone 02/12/2012 > resumed 07/2013   The goal with a chronic steroid dependent illness is always arriving at the lowest effective dose that controls the disease/symptoms and not accepting a set "formula" which is based on statistics or guidelines that don't always take into account patient  variability or the natural hx of the dz in every individual patient, which may well vary over time.  For now therefore I recommend the patient reduce prednisone to  10 a/w 5 daily for now to see what if any symptoms flare at the lower dose

## 2014-02-17 NOTE — Assessment & Plan Note (Addendum)
-   Walked 3 laps @ 185 ft each stopped due to  End of test, no desat on 4lpm 10/05/2010  - 02/02/2011  Walked RA  2 laps @ 185 ft each stopped due to  desat to 88 so change rx to 2lpm with activity outside the house and at hs - 08/21/2011  Walked RA x 1 laps @ 185 ft each stopped due to desat 85% - 11/21/2011  Walked RA  2 laps @ 185 ft each stopped due to  Sat 88 -  ONO RA < 89% x 6h 26 min 03/19/12 > repeat on 2lpm 04/05/12 > desats resolved - 04/08/2013   Walked RA x one lap @ 185 stopped due to  sats 85%  - HCO3 = 40 07/31/13 c/w hypercarbia - 08/10/13 Trach emergently by Janace Hoard with very difficult airway  - 02/16/2014   Walked 2lpm x 2 laps @ 185 stopped due to  Sob and desat to 81 slow pace > rec increase to 2lpm walking more than room to room  rx as of 02/16/14  is 2lpm  24/7 except 3lpm with exertion

## 2014-02-19 ENCOUNTER — Ambulatory Visit (INDEPENDENT_AMBULATORY_CARE_PROVIDER_SITE_OTHER): Payer: Medicare Other | Admitting: Internal Medicine

## 2014-02-19 ENCOUNTER — Encounter: Payer: Self-pay | Admitting: Internal Medicine

## 2014-02-19 VITALS — BP 122/73 | HR 95 | Temp 98.9°F | Resp 22 | Ht 61.5 in | Wt 245.8 lb

## 2014-02-19 DIAGNOSIS — IMO0002 Reserved for concepts with insufficient information to code with codable children: Secondary | ICD-10-CM

## 2014-02-19 DIAGNOSIS — Z0000 Encounter for general adult medical examination without abnormal findings: Secondary | ICD-10-CM | POA: Diagnosis not present

## 2014-02-19 DIAGNOSIS — E1165 Type 2 diabetes mellitus with hyperglycemia: Secondary | ICD-10-CM | POA: Diagnosis not present

## 2014-02-19 DIAGNOSIS — Z23 Encounter for immunization: Secondary | ICD-10-CM | POA: Diagnosis not present

## 2014-02-19 DIAGNOSIS — I10 Essential (primary) hypertension: Secondary | ICD-10-CM | POA: Diagnosis not present

## 2014-02-19 LAB — POCT GLYCOSYLATED HEMOGLOBIN (HGB A1C): Hemoglobin A1C: 8.5

## 2014-02-19 LAB — GLUCOSE, CAPILLARY: Glucose-Capillary: 381 mg/dL — ABNORMAL HIGH (ref 70–99)

## 2014-02-19 NOTE — Patient Instructions (Signed)
General Instructions:  Please bring your medicines with you each time you come to clinic.  Medicines may include prescription medications, over-the-counter medications, herbal remedies, eye drops, vitamins, or other pills.  Lets increase your levemir to 28 units in the morning and keep your sliding scale.   Call me in one week with values we can adjust  Progress Toward Treatment Goals:  Treatment Goal 02/19/2014  Hemoglobin A1C deteriorated  Blood pressure at goal  Stop smoking -   Self Care Goals & Plans:  Self Care Goal 02/19/2014  Manage my medications bring my medications to every visit; refill my medications on time; take my medicines as prescribed  Monitor my health bring my glucose meter and log to each visit; keep track of my blood glucose  Eat healthy foods eat foods that are low in salt; eat baked foods instead of fried foods; eat fruit for snacks and desserts; eat more vegetables; drink diet soda or water instead of juice or soda  Be physically active take a walk every day  Stop smoking -    Home Blood Glucose Monitoring 09/09/2013  Check my blood sugar 3 times a day  When to check my blood sugar before meals    Care Management & Community Referrals:  Referral 09/03/2012  Referrals made to community resources exercise/physical therapy

## 2014-02-22 DIAGNOSIS — Z0000 Encounter for general adult medical examination without abnormal findings: Secondary | ICD-10-CM | POA: Insufficient documentation

## 2014-02-22 NOTE — Assessment & Plan Note (Signed)
BP Readings from Last 3 Encounters:  02/19/14 122/73  02/16/14 112/66  02/05/14 151/96   Lab Results  Component Value Date   NA 138 01/02/2014   K 4.9 01/02/2014   CREATININE 1.26* 01/02/2014   Assessment: Blood pressure control: controlled Progress toward BP goal:  at goal Comments: did not bring medications with her today  Plan: Medications:  continue current medications norvasc 5, coreg 12.5mg  bid, lasix 40mg , and avapro 150mg  daily

## 2014-02-22 NOTE — Progress Notes (Signed)
Subjective:   Patient ID: Melody Gray female   DOB: [birth year] 43 y.o.   MRN: [id number]  HPI: Melody Gray is a 43 y.o. female with extensive PMH as listed below presenting to opc today for routine visit.   Respiratory: [name] still in place and O2 dependent. Doing better, slowly losing weight, steroids being tapered, and following up with pulmonary. She is still not smoking which is wonderful. No SOB today or chest pain.   DM2--brought meter in today. Noted to have mostly hyperglycemia at lunch time and evenings which are her biggest meals. She is very afraid of dropping too fast and that's why she likes the novolog sliding scale. We will need to adjust insulin today based on these values. She is also working on her diet but not able to exercise much. No lows, but cbgs >200-400s around noon and ~200-300s in PM. Before lunch, usually <200.   Remains stressed at home with boyfriend but has better support now with her friends, one of which is present with her today.   Reports having a ?yeast infection 1-2 weeks ago but cured herself by drinking apple cider vinegar and water. No more itching at this time.  Past Medical History  Diagnosis Date  . Sarcoidosis     Followed by Dr. Melvyn Novas; w/ liver involvement per biopsy 12/09, Reversible airway component so started on [facility] 01/2010; HFA 75% p coaching 05/2010  . Hypoxemia     CT angiogram 9/11>> No PE; PFTs 10/11- FEV1 1.20 (49%) with 16% better p B2, DLCO 33%> corrects to 84; O2 sats ok on 4 lpm X rapid walk X 3 laps 05/2010  . Morbid obesity     Target wt= 153 for BMI <30  . QT prolongation   . Diabetes mellitus   . Hypertension   . Hx of cardiac cath 2/08    No CAD, no RAS,  normal EF  . Seborrheic dermatitis of scalp   . 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 Hep B serology neg. but Hep C ab +, HIV neg. 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  Cardiomyopathy, nonischemic     EF 45% 12/10; Echo 7/11 normal EF, PAS 48  . Diabetic retinopathy     Right eye 2/11  . Health maintenance examination     Mammogram 05/2010 Negative; Last Pap smear 03/2008; Last DM eye exam 2/11> mild non-proliferative diabetic retinopathy. OD  . Helicobacter pylori ab+ 05/2011    Pt was symptomatic and treatment planned for 05/2011  . CHF (congestive heart failure)    Current Outpatient Prescriptions  Medication Sig Dispense Refill  . albuterol (PROVENTIL HFA;VENTOLIN HFA) 108 (90 BASE) MCG/ACT inhaler Inhale 2 puffs into the lungs every 6 (six) hours as needed for wheezing or shortness of breath.  1 Inhaler  2  . amLODipine (NORVASC) 5 MG tablet Take 1 tablet (5 mg total) by mouth daily.  30 tablet  5  . atorvastatin (LIPITOR) 20 MG tablet Take 1 tablet (20 mg total) by mouth daily at 6 PM.  90 tablet  3  . carvedilol (COREG) 12.5 MG tablet Take 1 tablet (12.5 mg total) by mouth 2 (two) times daily.  180 tablet  3  . ferrous sulfate 325 (65 FE) MG tablet Take 1 tablet (325 mg total) by mouth 3 (three) times daily with meals.  90 tablet  3  .  furosemide (LASIX) 40 MG tablet Take 40 mg by mouth daily.      . insulin aspart (NOVOLOG) 100 UNIT/ML injection Take novolog per sliding scale: CBG 70-120: 0 units  CBG 121-150: 1 unit  CBG 151-200: 2 units  CBG 201-250: 3 units  CBG 251-300: 5 units  CBG 301-350: 7 units  CBG 351-400: 9 units  CBG > 400: call MD  30 mL  3  . insulin detemir (LEVEMIR) 100 UNIT/ML injection 28 units every morning and 20 units every night. diag code 250.00      . irbesartan (AVAPRO) 150 MG tablet Take 1 tablet by mouth daily.      . metFORMIN (GLUCOPHAGE) 1000 MG tablet Take 1 tablet by mouth 2 (two) times daily.      Marland Kitchen omeprazole (PRILOSEC) 20 MG capsule Take 20 mg by mouth daily.      . predniSONE (DELTASONE) 10 MG tablet take 1 tablet by mouth once daily WITH BREAKFAST  30 tablet  2  . Dextromethorphan-Guaifenesin (MUCINEX DM  MAXIMUM STRENGTH) 60-1200 MG TB12 One every 12 hours as needed for cough      . Melatonin 3 MG TABS Take 1 tablet (3 mg total) by mouth at bedtime as needed.    0   No current facility-administered medications for this visit.   Family History  Problem Relation Age of Onset  . Cancer Mother     colon cancer  . Multiple sclerosis Father   . Asthma Sister     in childhood  . Diabetes Father   . Hypertension     History   Social History  . Marital Status: Single    Spouse Name: N/A    Number of Children: 2  . Years of Education: N/A   Occupational History  . 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 Smoking status: Former Smoker -- 0.30 packs/day for 20 years    Quit date: 06/29/2013  . Smokeless tobacco: Never Used     Comment: Needs to cut back.  . Alcohol Use: No  . Drug Use: No  . Sexual Activity: None   Other Topics Concern  . None   Social History Narrative   Diabetic card given 05/03/2010.   Financial assistance approved for 100% discount at [facility] and has [facility] card. [name] 12/07/2009.      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   Review of Systems:  Constitutional:  Denies fever, chills  Respiratory:  Trach in place   Cardiovascular:  Denies chest pain  Gastrointestinal:  Denies nausea, vomiting, abdominal pain   Genitourinary:  Denies dysuria or irritation  Musculoskeletal:  Denies myalgias  Skin:  Denies pallor, rash and wound.   Neurological:  Denies headache.    Objective:  Physical Exam: Filed Vitals:   02/19/14 1425  BP: 122/73  Pulse: 95  Temp: 98.9 F (37.2 C)  TempSrc: Oral  Resp: 22  Height: 5' 1.5" (1.562 m)  Weight: 245 lb 12.8 oz (111.494 kg)  SpO2: 98%   Vitals reviewed. General: sitting in chair, NAD HEENT: EOMI, trach in place Cardiac: distant heart sounds Pulm: clear to auscultation bilaterally Abd: soft, obese, nontender, nondistended, BS present Ext: moving all 4  extremities Neuro: alert and oriented X3, strength and sensation to light touch equal in bilateral upper and lower extremities  Assessment & Plan:  Discussed with Dr. Lynnae January

## 2014-02-22 NOTE — Assessment & Plan Note (Signed)
prevnar vaccine today a1c done today

## 2014-02-22 NOTE — Assessment & Plan Note (Addendum)
Lab Results  Component Value Date   HGBA1C 8.5 02/19/2014   HGBA1C 8.1* 08/30/2013   HGBA1C 6.5 05/20/2013    Assessment: Diabetes control: fair control Progress toward A1C goal:  deteriorated Comments: slightly elevated to 8.5, hyperglycemia mainly in afternoon and dinner time. Improving as steroids are tapered. No hypoglycemia. Likes sliding scale   Plan: Medications:  increase levemir to 28 units in AM and keep 20 units in PM and continue novolog sliding scale Take novolog per sliding scale: CBG 70-120: 0 units CBG 121-150: 1 unit CBG 151-200: 2 units CBG 201-250: 3 units CBG 251-300: 5 units CBG 301-350: 7 units CBG 351-400: 9 units CBG > 400: call MD, Phone In, Last Dose: Taking  Home glucose monitoring: Frequency:   Timing:   Instruction/counseling given: reminded to bring blood glucose meter & log to each visit, reminded to bring medications to each visit and discussed diet Educational resources provided:   Self management tools provided:   Other plans: eventually would like to get her off sliding scale and fixed meal time coverage that might give better control but she did not want that yet. Follow up with cbg's on phone and we can adjust insulin that way if needed.

## 2014-02-23 NOTE — Progress Notes (Signed)
Internal Medicine Clinic Attending  Case discussed with Dr. Qureshi soon after the resident saw the patient.  We reviewed the resident's history and exam and pertinent patient test results.  I agree with the assessment, diagnosis, and plan of care documented in the resident's note. 

## 2014-03-04 ENCOUNTER — Encounter: Payer: Self-pay | Admitting: Internal Medicine

## 2014-03-05 ENCOUNTER — Other Ambulatory Visit: Payer: Self-pay | Admitting: *Deleted

## 2014-03-19 NOTE — Telephone Encounter (Signed)
closed

## 2014-03-24 ENCOUNTER — Encounter: Payer: Self-pay | Admitting: Internal Medicine

## 2014-03-24 ENCOUNTER — Telehealth: Payer: Self-pay | Admitting: Adult Health

## 2014-03-24 MED ORDER — AZITHROMYCIN 250 MG PO TABS: ORAL_TABLET | ORAL | Status: DC

## 2014-03-24 MED ORDER — PREDNISONE 10 MG PO TABS: ORAL_TABLET | ORAL | Status: DC

## 2014-03-24 NOTE — Telephone Encounter (Signed)
zpak Prednisone 10 mg take  4 each am x 2 days,   2 each am x 2 days,  1 each am x 2 days and stop  Ov one week if not better - to er over holiday w/e if worse

## 2014-03-24 NOTE — Telephone Encounter (Signed)
Called and spoke with pt and she stated that she feels she has bronchitis again.  She is coughing with dark yellow sputum with a hint of blood in it.  She stated that this started yesterday.  Denies any fever, chills, body aches.  Pt is requesting that something be called in for her.  MW please advise. Thanks  Allergies  Allergen Reactions  . Vicodin [Hydrocodone-Acetaminophen] Itching    Current Outpatient Prescriptions on File Prior to Visit  Medication Sig Dispense Refill  . albuterol (PROVENTIL HFA;VENTOLIN HFA) 108 (90 BASE) MCG/ACT inhaler Inhale 2 puffs into the lungs every 6 (six) hours as needed for wheezing or shortness of breath. 1 Inhaler 2  . amLODipine (NORVASC) 5 MG tablet Take 1 tablet (5 mg total) by mouth daily. 30 tablet 5  . atorvastatin (LIPITOR) 20 MG tablet Take 1 tablet (20 mg total) by mouth daily at 6 PM. 90 tablet 3  . carvedilol (COREG) 12.5 MG tablet Take 1 tablet (12.5 mg total) by mouth 2 (two) times daily. 180 tablet 3  . Dextromethorphan-Guaifenesin (MUCINEX DM MAXIMUM STRENGTH) 60-1200 MG TB12 One every 12 hours as needed for cough    . ferrous sulfate 325 (65 FE) MG tablet Take 1 tablet (325 mg total) by mouth 3 (three) times daily with meals. 90 tablet 3  . furosemide (LASIX) 40 MG tablet Take 40 mg by mouth daily.    . insulin aspart (NOVOLOG) 100 UNIT/ML injection Take novolog per sliding scale: CBG 70-120: 0 units  CBG 121-150: 1 unit  CBG 151-200: 2 units  CBG 201-250: 3 units  CBG 251-300: 5 units  CBG 301-350: 7 units  CBG 351-400: 9 units  CBG > 400: call MD 30 mL 3  . insulin detemir (LEVEMIR) 100 UNIT/ML injection 28 units every morning and 20 units every night. diag code 250.00    . irbesartan (AVAPRO) 150 MG tablet Take 1 tablet by mouth daily.    . Melatonin 3 MG TABS Take 1 tablet (3 mg total) by mouth at bedtime as needed.  0  . metFORMIN (GLUCOPHAGE) 1000 MG tablet Take 1 tablet by mouth 2 (two) times daily.    Marland Kitchen omeprazole (PRILOSEC) 20  MG capsule Take 20 mg by mouth daily.    . predniSONE (DELTASONE) 10 MG tablet take 1 tablet by mouth once daily WITH BREAKFAST 30 tablet 2   No current facility-administered medications on file prior to visit.

## 2014-03-24 NOTE — Telephone Encounter (Signed)
Called and spoke to pt. Informed pt of the recs per MW. Rx sent to preferred pharmacy. Pt verbalized understanding and denied any further questions or concerns at this time.   

## 2014-03-27 ENCOUNTER — Emergency Department (HOSPITAL_COMMUNITY): Payer: Medicare Other

## 2014-03-27 ENCOUNTER — Emergency Department (HOSPITAL_COMMUNITY)
Admission: EM | Admit: 2014-03-27 | Discharge: 2014-03-27 | Disposition: A | Discharge status: Home / Self Care | Payer: Medicare Other | Attending: Emergency Medicine | Admitting: Emergency Medicine

## 2014-03-27 ENCOUNTER — Encounter (HOSPITAL_COMMUNITY): Payer: Self-pay | Admitting: *Deleted

## 2014-03-27 DIAGNOSIS — Z7952 Long term (current) use of systemic steroids: Secondary | ICD-10-CM | POA: Insufficient documentation

## 2014-03-27 DIAGNOSIS — Z79899 Other long term (current) drug therapy: Secondary | ICD-10-CM | POA: Insufficient documentation

## 2014-03-27 DIAGNOSIS — Z794 Long term (current) use of insulin: Secondary | ICD-10-CM | POA: Insufficient documentation

## 2014-03-27 DIAGNOSIS — Z930 Tracheostomy status: Secondary | ICD-10-CM | POA: Diagnosis not present

## 2014-03-27 DIAGNOSIS — D869 Sarcoidosis, unspecified: Secondary | ICD-10-CM | POA: Diagnosis not present

## 2014-03-27 DIAGNOSIS — I509 Heart failure, unspecified: Secondary | ICD-10-CM | POA: Insufficient documentation

## 2014-03-27 DIAGNOSIS — M7989 Other specified soft tissue disorders: Secondary | ICD-10-CM

## 2014-03-27 DIAGNOSIS — R0602 Shortness of breath: Secondary | ICD-10-CM | POA: Diagnosis not present

## 2014-03-27 DIAGNOSIS — Z9889 Other specified postprocedural states: Secondary | ICD-10-CM | POA: Insufficient documentation

## 2014-03-27 DIAGNOSIS — Z8619 Personal history of other infectious and parasitic diseases: Secondary | ICD-10-CM | POA: Insufficient documentation

## 2014-03-27 DIAGNOSIS — E13319 Other specified diabetes mellitus with unspecified diabetic retinopathy without macular edema: Secondary | ICD-10-CM | POA: Diagnosis not present

## 2014-03-27 DIAGNOSIS — I10 Essential (primary) hypertension: Secondary | ICD-10-CM | POA: Diagnosis not present

## 2014-03-27 DIAGNOSIS — Z9851 Tubal ligation status: Secondary | ICD-10-CM | POA: Insufficient documentation

## 2014-03-27 DIAGNOSIS — J9811 Atelectasis: Secondary | ICD-10-CM | POA: Diagnosis not present

## 2014-03-27 DIAGNOSIS — R2240 Localized swelling, mass and lump, unspecified lower limb: Secondary | ICD-10-CM | POA: Insufficient documentation

## 2014-03-27 DIAGNOSIS — Z87891 Personal history of nicotine dependence: Secondary | ICD-10-CM | POA: Diagnosis not present

## 2014-03-27 DIAGNOSIS — E119 Type 2 diabetes mellitus without complications: Secondary | ICD-10-CM | POA: Diagnosis not present

## 2014-03-27 DIAGNOSIS — R0789 Other chest pain: Secondary | ICD-10-CM | POA: Diagnosis not present

## 2014-03-27 LAB — BASIC METABOLIC PANEL
Anion gap: 14 (ref 5–15)
BUN: 30 mg/dL — ABNORMAL HIGH (ref 6–23)
CO2: 29 mEq/L (ref 19–32)
Calcium: 10.1 mg/dL (ref 8.4–10.5)
Chloride: 95 mEq/L — ABNORMAL LOW (ref 96–112)
Creatinine, Ser: 1.29 mg/dL — ABNORMAL HIGH (ref 0.50–1.10)
GFR calc Af Amer: 57 mL/min — ABNORMAL LOW (ref >90)
GFR calc non Af Amer: 49 mL/min — ABNORMAL LOW (ref >90)
Glucose, Bld: 392 mg/dL — ABNORMAL HIGH (ref 70–99)
Potassium: 5.3 mEq/L (ref 3.7–5.3)
Sodium: 138 mEq/L (ref 137–147)

## 2014-03-27 LAB — TROPONIN I: Troponin I: <0.3 ng/mL (ref <0.30)

## 2014-03-27 LAB — CBC
HCT: 40.6 % (ref 36.0–46.0)
Hemoglobin: 12.5 g/dL (ref 12.0–15.0)
MCH: 30.6 pg (ref 26.0–34.0)
MCHC: 30.8 g/dL (ref 30.0–36.0)
MCV: 99.5 fL (ref 78.0–100.0)
Platelets: 268 10*3/uL (ref 150–400)
RBC: 4.08 MIL/uL (ref 3.87–5.11)
RDW: 14.8 % (ref 11.5–15.5)
WBC: 15.2 10*3/uL — ABNORMAL HIGH (ref 4.0–10.5)

## 2014-03-27 LAB — PRO B NATRIURETIC PEPTIDE: Pro B Natriuretic peptide (BNP): 2262 pg/mL — ABNORMAL HIGH (ref 0–125)

## 2014-03-27 LAB — CBG MONITORING, ED: Glucose-Capillary: 343 mg/dL — ABNORMAL HIGH (ref 70–99)

## 2014-03-27 MED ORDER — IPRATROPIUM BROMIDE 0.02 % IN SOLN
0.5000 mg | Freq: Once | RESPIRATORY_TRACT | Status: AC
  Administered 2014-03-27: 0.5 mg via RESPIRATORY_TRACT
  Filled 2014-03-27: qty 2.5

## 2014-03-27 MED ORDER — IOHEXOL 350 MG/ML SOLN
100.0000 mL | Freq: Once | INTRAVENOUS | Status: AC | PRN
  Administered 2014-03-27: 100 mL via INTRAVENOUS

## 2014-03-27 MED ORDER — ALBUTEROL SULFATE (2.5 MG/3ML) 0.083% IN NEBU
5.0000 mg | INHALATION_SOLUTION | Freq: Once | RESPIRATORY_TRACT | Status: AC
  Administered 2014-03-27: 5 mg via RESPIRATORY_TRACT
  Filled 2014-03-27: qty 6

## 2014-03-27 NOTE — ED Notes (Signed)
Patient returned from [place]. Patient comfortable. Patient remains on the cardiac monitor. Waiting for results.

## 2014-03-27 NOTE — ED Notes (Signed)
Pt transported to CT ?

## 2014-03-27 NOTE — ED Notes (Signed)
Patient returned from X-ray 

## 2014-03-27 NOTE — ED Provider Notes (Signed)
CSN: [id number]     Arrival date & time 03/27/14  1803 History   First MD Initiated Contact with Patient 03/27/14 1822     Chief Complaint  Patient presents with  . Shortness of Breath     (Consider location/radiation/quality/duration/timing/severity/associated sxs/prior Treatment) HPI  Patient has a PMH of sarcoidosis, hypoxemia w/ trach, morbid obesity, diabetes, hypertension, seborrheic, CHF comes to the ED with bilateral lower extremity swelling, cough and worsened SOB.   She reports that this happens intermittently and usually resolves on its own so she did not tell her PCP. She tried taking a laxative and prevacid to make the symptoms go away but it did not help. She reports being unable to sleep last night because of her cough and breathing. She had been cigarette free for the past 8 months since she had a tracheostomy placed but reports being stressed out and had a few cigarettes recently. She has not had fevers, increased urination or CP.  Past Medical History  Diagnosis Date  . Sarcoidosis     Followed by Dr. Melvyn Novas; w/ liver involvement per biopsy 12/09, Reversible airway component so started on [facility] 01/2010; HFA 75% p coaching 05/2010  . Hypoxemia     CT angiogram 9/11>> No PE; PFTs 10/11- FEV1 1.20 (49%) with 16% better p B2, DLCO 33%> corrects to 84; O2 sats ok on 4 lpm X rapid walk X 3 laps 05/2010  . Morbid obesity     Target wt= 153 for BMI <30  . QT prolongation   . Diabetes mellitus   . Hypertension   . Hx of cardiac cath 2/08    No CAD, no RAS,  normal EF  . Seborrheic dermatitis of scalp   . 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 Hep B serology neg. but Hep C ab +, HIV neg. 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 Cardiomyopathy, nonischemic     EF 45% 12/10; Echo 7/11 normal EF, PAS 48  . Diabetic retinopathy     Right eye 2/11  . Health maintenance examination     Mammogram  05/2010 Negative; Last Pap smear 03/2008; Last DM eye exam 2/11> mild non-proliferative diabetic retinopathy. OD  . Helicobacter pylori ab+ 05/2011    Pt was symptomatic and treatment planned for 05/2011  . CHF (congestive heart failure)    Past Surgical History  Procedure Laterality Date  . Tubal ligation    . Breast surgery    . Cesarean section    . Tracheostomy tube placement N/A 08/10/2013    Procedure: TRACHEOSTOMY;  Surgeon: Melissa Montane, MD;  Location: [facility] OR;  Service: ENT;  Laterality: N/A;   Family History  Problem Relation Age of Onset  . Cancer Mother     colon cancer  . Multiple sclerosis Father   . Asthma Sister     in childhood  . Diabetes Father   . Hypertension     History  Substance Use Topics  . Smoking status: Former Smoker -- 0.30 packs/day for 20 years    Quit date: 06/29/2013  . Smokeless tobacco: Never Used     Comment: Needs to cut back.  . Alcohol Use: No   OB History    No data available     Review of Systems 10 Systems reviewed and are negative for acute change except as noted in the HPI.    Allergies  Vicodin  Home Medications   Prior to Admission medications   Medication Sig Start Date End Date Taking? Authorizing Provider  albuterol (PROVENTIL HFA;VENTOLIN HFA) 108 (90 BASE) MCG/ACT inhaler Inhale 2 puffs into the lungs every 6 (six) hours as needed for wheezing or shortness of breath. 11/26/13  Yes Marijean Heath, NP  amLODipine (NORVASC) 5 MG tablet Take 1 tablet (5 mg total) by mouth daily.   Yes Wilber Oliphant, MD  atorvastatin (LIPITOR) 20 MG tablet Take 1 tablet (20 mg total) by mouth daily at 6 PM. 12/17/13  Yes Isaiah Serge, NP  azithromycin (ZITHROMAX Z-PAK) 250 MG tablet Take as directed. Patient taking differently: Take 250 mg by mouth. On day 3 of therapy. 03/24/14  Yes Tanda Rockers, MD  carvedilol (COREG) 12.5 MG tablet Take 1 tablet (12.5 mg total) by mouth 2 (two) times daily. 02/05/14  Yes Lelon Perla, MD   Dextromethorphan-Guaifenesin [facility] DM MAXIMUM STRENGTH) 60-1200 MG TB12 One every 12 hours as needed for cough 11/19/13  Yes Tanda Rockers, MD  ferrous sulfate 325 (65 FE) MG tablet Take 1 tablet (325 mg total) by mouth 3 (three) times daily with meals.   Yes Wilber Oliphant, MD  furosemide (LASIX) 40 MG tablet Take 40 mg by mouth daily. 11/26/13 11/26/14 Yes Marijean Heath, NP  insulin aspart (NOVOLOG) 100 UNIT/ML injection Take novolog per sliding scale: CBG 70-120: 0 units  CBG 121-150: 1 unit  CBG 151-200: 2 units  CBG 201-250: 3 units  CBG 251-300: 5 units  CBG 301-350: 7 units  CBG 351-400: 9 units  CBG > 400: call MD 12/08/13  Yes Wilber Oliphant, MD  insulin detemir (LEVEMIR) 100 UNIT/ML injection 28 units every morning and 20 units every night. diag code 250.00 12/01/13  Yes Wilber Oliphant, MD  irbesartan (AVAPRO) 150 MG tablet Take 1 tablet by mouth daily. 11/11/13  Yes Historical Provider, MD  Melatonin 3 MG TABS Take 1 tablet (3 mg total) by mouth at bedtime as needed. 11/26/13  Yes Marijean Heath, NP  metFORMIN (GLUCOPHAGE) 1000 MG tablet Take 1 tablet by mouth 2 (two) times daily. 11/11/13  Yes Historical Provider, MD  omeprazole (PRILOSEC) 20 MG capsule Take 20 mg by mouth daily.   Yes Historical Provider, MD  predniSONE (DELTASONE) 10 MG tablet take 1 tablet by mouth once daily WITH BREAKFAST 11/11/13  Yes Tanda Rockers, MD   BP 124/73 mmHg  Pulse 96  Temp(Src) 98.4 F (36.9 C) (Oral)  Resp 18  SpO2 92%  LMP 03/24/2014 Physical Exam  Constitutional: She appears well-developed and well-nourished. No distress.  HENT:  Head: Normocephalic and atraumatic.  Eyes: Pupils are equal, round, and reactive to light.  Neck: Normal range of motion. Neck supple.  Cardiovascular: Normal rate and regular rhythm.   Pulmonary/Chest: Effort normal. She has decreased breath sounds (in all 4 lung fields). She has wheezes.  Tracheostomy is in place and the edges are clean and  well kept.  Abdominal: Soft. Bowel sounds are normal. She exhibits no fluid wave. There is no tenderness. There is no rebound and no CVA tenderness.  Exam limited by body habitus, pt feels that it is more distended than normal.   Musculoskeletal:  2+ pitting edema to bilateral lower extremities that is symmetrical  Neurological: She is alert.  Skin: Skin is warm and dry.  Nursing note and vitals reviewed.   ED Course  Procedures (including critical care time) Labs  Review Labs Reviewed  CBC - Abnormal; Notable for the following:    WBC 15.2 (*)    All other components within normal limits  BASIC METABOLIC PANEL - Abnormal; Notable for the following:    Chloride 95 (*)    Glucose, Bld 392 (*)    BUN 30 (*)    Creatinine, Ser 1.29 (*)    GFR calc non Af Amer 49 (*)    GFR calc Af Amer 57 (*)    All other components within normal limits  PRO B NATRIURETIC PEPTIDE - Abnormal; Notable for the following:    Pro B Natriuretic peptide (BNP) 2262.0 (*)    All other components within normal limits  CBG MONITORING, ED - Abnormal; Notable for the following:    Glucose-Capillary 343 (*)    All other components within normal limits  TROPONIN I    Imaging Review Dg Chest 2 View  03/27/2014   CLINICAL DATA:  Shortness of breath.  EXAM: CHEST  2 VIEW  COMPARISON:  January 29, 2014.  FINDINGS: Stable cardiomediastinal silhouette. Tracheostomy tube is unchanged in position. No pneumothorax or pleural effusion is noted. Slightly increased bibasilar opacities are noted most consistent with subsegmental atelectasis. Bony thorax is intact.  IMPRESSION: Slightly increased bibasilar subsegmental atelectasis compared to prior exam.   Electronically Signed   By: Sabino Dick M.D.   On: 03/27/2014 20:06   Ct Angio Chest Pe W/cm &/or Wo Cm  03/27/2014   CLINICAL DATA:  Shortness of breath beginning last week, chest tightness, arm swelling, abdominal swelling beginning last week. History of diabetes and  sarcoidosis. Tracheostomy tube placed April 2015 for hypercarbic respiratory failure.  EXAM: CT ANGIOGRAPHY CHEST WITH CONTRAST  TECHNIQUE: Multidetector CT imaging of the chest was performed using the standard protocol during bolus administration of intravenous contrast. Multiplanar CT image reconstructions and MIPs were obtained to evaluate the vascular anatomy.  CONTRAST:  11mL OMNIPAQUE IOHEXOL 350 MG/ML SOLN  COMPARISON:  Chest radiograph March 27, 2014  FINDINGS: Large body habitus results in overall noisy image quality. Suboptimal contrast opacification the pulmonary arteries, 192 Hounsfield units, target is 250 Hounsfield units. Main pulmonary artery is enlarged, 4.5 cm in transaxial dimension. No pulmonary arterial filling defects to at least the proximal segmental branches.  The heart is appears moderately enlarged, no pericardial fluid collections. Thoracic aorta is normal in course and caliber with mild calcific atherosclerosis, 2 vessel arch is a normal variant.  Mediastinal lymphadenopathy including 10 mm aortopulmonary window lymph node. Tracheostomy tube in place. Tracheobronchial tree is patent and midline. No pneumothorax. Linear apparent scarring in the lung bases without pleural effusion or focal consolidation.  Probable cirrhosis partially imaged. No visualized ascites. No acute osseous process.  Review of the MIP images confirms the above findings.  IMPRESSION: Suboptimal pulmonary arterial contrast opacification without imaging evidence of acute pulmonary embolism to the proximal segmental branches.  Enlarged pulmonary artery is consistent with chronic pulmonary arterial hypertension without acute cardiopulmonary process. Mediastinal lymphadenopathy may be reactive or associated with patient's reported sarcoidosis.  Cardiomegaly.  Suspect cirrhosis.   Electronically Signed   By: Elon Alas   On: 03/27/2014 21:58     EKG Interpretation   Date/Time:  Friday March 27 2014  18:11:17 EST Ventricular Rate:  108 PR Interval:  130 QRS Duration: 80 QT Interval:  328 QTC Calculation: 439 R Axis:   -69 Text Interpretation:  Sinus tachycardia Left anterior fascicular block  Nonspecific ST abnormality Abnormal ECG No significant change  since last  tracing Confirmed by Debby Freiberg [phone]) on 03/27/2014 8:41:24 PM      MDM   Final diagnoses:  Shortness of breath  Swelling of lower extremity    Patient over all looks well and has had a thorough work-up in the ED. She admits that her SOB has improved with the nebulizer treatment. Her labs and xray are not consistent with severe CHF exacerbation and Dr. Colin Rhein and I question possible PE. She had a CT angio of the chest done which did not show any acute findings to explain her SOB. She does have lower extremity swelling still. She uses home oxygen at all times and oxygen to her trach during sleep. Her 02 sats are at her baseline and she does not have increased effort of breathing.  Dr. Colin Rhein has seen the patient and recommends having the patient hold off on her lasix tomorrow (40mg  PO daily) and then on Sunday take an 80 mg PO dose. She will hold off on lasix tomorrow due to contrast and her renal dysfunction. She will also hold off on her Metformin for 48 hours due to angio contrast.   The patient has been given ILLICIT instructions to return to the ED if she develops worsened LE swelling, SOB or any CP. She understands and prefers to go home over being admitted for treatment as admittance was offered if the patient felt more comfortable with this.  43 y.o.Allen S Katzenberger's evaluation in the Emergency Department is complete. It has been determined that no acute conditions requiring further emergency intervention are present at this time. The patient/guardian have been advised of the diagnosis and plan. We have discussed signs and symptoms that warrant return to the ED, such as changes or worsening in symptoms.  Vital  signs are stable at discharge. Filed Vitals:   03/27/14 2217  BP: 124/73  Pulse: 96  Temp: 98.4 F (36.9 C)  Resp: 18    Patient/guardian has voiced understanding and agreed to follow-up with the PCP or specialist.     Linus Mako, PA-C 03/27/14 2238  Debby Freiberg, MD 03/31/14 [phone]

## 2014-03-27 NOTE — ED Notes (Signed)
MD Gentry at the bedside.  

## 2014-03-27 NOTE — ED Notes (Signed)
The pot is c/o sob and swelling all over her body for one week.  On home 02.  She also has a trach

## 2014-03-27 NOTE — Discharge Instructions (Signed)
HOLD OFF ON YOUR LASIX TOMORROW Saturday 03/28/2014  TAKE 80mg  OF LASIX ON Sunday 03/29/2014 RESUME YOUR NORMAL DOSAGE OF 40mg  OF LASIX ON Monday. HOLD OFF ON YOUR METFORMIN FOR TWO DAYS. RESUME METFORMIN Monday.  Return to the ED as soon as your can if your shortness of breath or chest pain gets worse.   Peripheral Edema You have swelling in your legs (peripheral edema). This swelling is due to excess accumulation of salt and water in your body. Edema may be a sign of heart, kidney or liver disease, or a side effect of a medication. It may also be due to problems in the leg veins. Elevating your legs and using special support stockings may be very helpful, if the cause of the swelling is due to poor venous circulation. Avoid long periods of standing, whatever the cause. Treatment of edema depends on identifying the cause. Chips, pretzels, pickles and other salty foods should be avoided. Restricting salt in your diet is almost always needed. Water pills (diuretics) are often used to remove the excess salt and water from your body via urine. These medicines prevent the kidney from reabsorbing sodium. This increases urine flow. Diuretic treatment may also result in lowering of potassium levels in your body. Potassium supplements may be needed if you have to use diuretics daily. Daily weights can help you keep track of your progress in clearing your edema. You should call your caregiver for follow up care as recommended. SEEK IMMEDIATE MEDICAL CARE IF:   You have increased swelling, pain, redness, or heat in your legs.  You develop shortness of breath, especially when lying down.  You develop chest or abdominal pain, weakness, or fainting.  You have a fever. Document Released: 05/25/2004 Document Revised: 07/10/2011 Document Reviewed: 05/05/2009 [facility] Patient Information 2015 [place], [place]. This information is not intended to replace advice given to you by your health care provider. Make sure  you discuss any questions you have with your health care provider.

## 2014-04-10 ENCOUNTER — Telehealth: Payer: Self-pay | Admitting: Internal Medicine

## 2014-04-10 MED ORDER — OMEPRAZOLE 20 MG PO CPDR: 20.0000 mg | DELAYED_RELEASE_CAPSULE | Freq: Every day | ORAL | Status: DC

## 2014-04-10 NOTE — Telephone Encounter (Signed)
I called Melody Gray this afternoon in regards to her request for Avapro refill yesterday during the holiday dinner. She says she has been out of it for a few weeks and last refill in our system says July and it was actually discontinued during last hospitalization given renal failure. She was recently evaluated in the emergency room 11/27 and was not taking the ARB at that time and BP was well controlled. Based on our discussion today, she wishes to wait to restart the Avapro now until discussing with Dr. Stanford Breed next week during her appt on 12/18. She will also likely need a BMET at that time. She also plans to discuss with him her lower extremity swelling and lasix, that alternates between her legs at times. She currently denies any SOB.   As always, she is recommended to come to the office for a visit at any time if needed but she wishes to hold off for now. During the holiday dinner, she did also mention not liking prevacin and wishes to restart prilosec for her heartburn. I will refill that today.

## 2014-04-13 NOTE — Progress Notes (Signed)
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 I initially saw in July of 2012 for evaluation of abnormal echocardiogram. Echocardiogram in June of 2012 revealed an ejection fraction of 35% which was new. There was mild biatrial enlargement, mild right ventricular enlargement and mildly reduced RV function. Cardiac MRI in July of 2012 revealed moderate to severe LV systolic dysfunction, EF 123456. The anterior and anterolateral walls appeared severely hypokinetic. Mild RV dilation with mild systolic dysfunction. Possible small areas of mid wall delayed enhancement in the apical septal and apical lateral walls. These areas were very faint and not definitely of clinical significance. Mid wall enhancement can be found in infiltrative cardiomyopathies such as that due to sarcoidosis. TSH and ferritin normal. Cardiac catheterization repeated in September of 2012 and showed an ejection fraction of 40%. There was no coronary disease. Echo repeated in June 2015 and showed normal LV function, grade 2 diastolic dysfunction, mild LAE, mild RAE; mild RVE with reduced function. Lower ext dopplers 8/15 showed no DVT. Patient has sarcoid. Since I last saw her   Current Outpatient Prescriptions  Medication Sig Dispense Refill  . albuterol (PROVENTIL HFA;VENTOLIN HFA) 108 (90 BASE) MCG/ACT inhaler Inhale 2 puffs into the lungs every 6 (six) hours as needed for wheezing or shortness of breath. 1 Inhaler 2  . amLODipine (NORVASC) 5 MG tablet Take 1 tablet (5 mg total) by mouth daily. 30 tablet 5  . atorvastatin (LIPITOR) 20 MG tablet Take 1 tablet (20 mg total) by mouth daily at 6 PM. 90 tablet 3  . azithromycin (ZITHROMAX Z-PAK) 250 MG tablet Take as directed. (Patient taking differently: Take 250 mg by mouth. On day 3 of therapy.) 6 each 0  . carvedilol (COREG) 12.5 MG tablet Take 1 tablet (12.5 mg total) by mouth 2 (two) times daily. 180 tablet 3  . Dextromethorphan-Guaifenesin (MUCINEX DM MAXIMUM  STRENGTH) 60-1200 MG TB12 One every 12 hours as needed for cough    . ferrous sulfate 325 (65 FE) MG tablet Take 1 tablet (325 mg total) by mouth 3 (three) times daily with meals. 90 tablet 3  . furosemide (LASIX) 40 MG tablet Take 40 mg by mouth daily.    . insulin aspart (NOVOLOG) 100 UNIT/ML injection Take novolog per sliding scale: CBG 70-120: 0 units  CBG 121-150: 1 unit  CBG 151-200: 2 units  CBG 201-250: 3 units  CBG 251-300: 5 units  CBG 301-350: 7 units  CBG 351-400: 9 units  CBG > 400: call MD 30 mL 3  . insulin detemir (LEVEMIR) 100 UNIT/ML injection 28 units every morning and 20 units every night. diag code 250.00    . irbesartan (AVAPRO) 150 MG tablet Take 1 tablet by mouth daily.    . Melatonin 3 MG TABS Take 1 tablet (3 mg total) by mouth at bedtime as needed.  0  . metFORMIN (GLUCOPHAGE) 1000 MG tablet Take 1 tablet by mouth 2 (two) times daily.    Marland Kitchen omeprazole (PRILOSEC) 20 MG capsule Take 1 capsule (20 mg total) by mouth daily. 30 capsule 3  . predniSONE (DELTASONE) 10 MG tablet take 1 tablet by mouth once daily WITH BREAKFAST 30 tablet 2   No current facility-administered medications for this visit.     Past Medical History  Diagnosis Date  . Sarcoidosis     Followed by Dr. Melvyn Novas; w/ liver involvement per biopsy 12/09, Reversible airway component so started on [facility] 01/2010; HFA 75% p coaching 05/2010  .  Hypoxemia     CT angiogram 9/11>> No PE; PFTs 10/11- FEV1 1.20 (49%) with 16% better p B2, DLCO 33%> corrects to 84; O2 sats ok on 4 lpm X rapid walk X 3 laps 05/2010  . Morbid obesity     Target wt= 153 for BMI <30  . QT prolongation   . Diabetes mellitus   . Hypertension   . Hx of cardiac cath 2/08    No CAD, no RAS,  normal EF  . Seborrheic dermatitis of scalp   . 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 Hep B serology neg. but Hep C ab +, HIV neg. 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 Cardiomyopathy, nonischemic     EF 45% 12/10; Echo 7/11 normal EF, PAS 48  . Diabetic retinopathy     Right eye 2/11  . Health maintenance examination     Mammogram 05/2010 Negative; Last Pap smear 03/2008; Last DM eye exam 2/11> mild non-proliferative diabetic retinopathy. OD  . Helicobacter pylori ab+ 05/2011    Pt was symptomatic and treatment planned for 05/2011  . CHF (congestive heart failure)     Past Surgical History  Procedure Laterality Date  . Tubal ligation    . Breast surgery    . Cesarean section    . Tracheostomy tube placement N/A 08/10/2013    Procedure: TRACHEOSTOMY;  Surgeon: Melissa Montane, MD;  Location: [facility];  Service: ENT;  Laterality: N/A;    History   Social History  . Marital Status: Single    Spouse Name: N/A    Number of Children: 2  . Years of Education: N/A   Occupational History  . 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 Smoking status: Former Smoker -- 0.30 packs/day for 20 years    Quit date: 06/29/2013  . Smokeless tobacco: Never Used     Comment: Needs to cut back.  . Alcohol Use: No  . Drug Use: No  . Sexual Activity: Not on file   Other Topics Concern  . Not on file   Social History Narrative   Diabetic card given 05/03/2010.   Financial assistance approved for 100% discount at [facility] and has [facility] card. [name] 12/07/2009.      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    ROS: no fevers or chills, productive cough, hemoptysis, dysphasia, odynophagia, melena, hematochezia, dysuria, hematuria, rash, seizure activity, orthopnea, PND, pedal edema, claudication. Remaining systems are negative.  Physical Exam: Well-developed well-nourished in no acute distress.  Skin is warm and dry.  HEENT is normal.  Neck is supple.  Chest is clear to auscultation with normal expansion.  Cardiovascular exam is regular rate and rhythm.  Abdominal exam nontender or distended. No masses  palpated. Extremities show no edema. neuro grossly intact  ECG     This encounter was created in error - please disregard.

## 2014-04-16 ENCOUNTER — Other Ambulatory Visit: Payer: Self-pay | Admitting: Internal Medicine

## 2014-04-17 ENCOUNTER — Encounter: Payer: Medicare Other | Admitting: Cardiology

## 2014-05-04 ENCOUNTER — Telehealth: Payer: Self-pay | Admitting: *Deleted

## 2014-05-04 NOTE — Telephone Encounter (Signed)
Pt called stating  her legs and feet are still swollen.  She reported this at our Christmas Party 12/11.   They still remain swollen.   Pt given an appointment for tomorrow for evaluation.

## 2014-05-05 ENCOUNTER — Encounter (HOSPITAL_COMMUNITY): Payer: Self-pay | Admitting: General Practice

## 2014-05-05 ENCOUNTER — Ambulatory Visit (HOSPITAL_COMMUNITY)
Admission: RE | Admit: 2014-05-05 | Discharge: 2014-05-05 | Disposition: A | Discharge status: Home / Self Care | Payer: Medicare Other | Source: Ambulatory Visit | Attending: Internal Medicine | Admitting: Internal Medicine

## 2014-05-05 ENCOUNTER — Ambulatory Visit: Payer: Medicare Other | Admitting: Internal Medicine

## 2014-05-05 ENCOUNTER — Encounter: Payer: Self-pay | Admitting: Internal Medicine

## 2014-05-05 ENCOUNTER — Inpatient Hospital Stay (HOSPITAL_COMMUNITY)
Admission: AD | Admit: 2014-05-05 | Discharge: 2014-05-09 | DRG: 871 | Disposition: A | Discharge status: Home / Self Care | Payer: Medicare Other | Source: Ambulatory Visit | Attending: Internal Medicine | Admitting: Internal Medicine

## 2014-05-05 VITALS — BP 128/83 | HR 88 | Temp 98.7°F | Resp 18 | Wt 244.3 lb

## 2014-05-05 VITALS — BP 127/74 | HR 103 | Wt 247.0 lb

## 2014-05-05 DIAGNOSIS — Z825 Family history of asthma and other chronic lower respiratory diseases: Secondary | ICD-10-CM | POA: Diagnosis not present

## 2014-05-05 DIAGNOSIS — Z7952 Long term (current) use of systemic steroids: Secondary | ICD-10-CM | POA: Diagnosis not present

## 2014-05-05 DIAGNOSIS — A419 Sepsis, unspecified organism: Principal | ICD-10-CM | POA: Diagnosis present

## 2014-05-05 DIAGNOSIS — Z794 Long term (current) use of insulin: Secondary | ICD-10-CM | POA: Diagnosis not present

## 2014-05-05 DIAGNOSIS — I10 Essential (primary) hypertension: Secondary | ICD-10-CM | POA: Diagnosis present

## 2014-05-05 DIAGNOSIS — E11329 Type 2 diabetes mellitus with mild nonproliferative diabetic retinopathy without macular edema: Secondary | ICD-10-CM | POA: Diagnosis present

## 2014-05-05 DIAGNOSIS — N183 Chronic kidney disease, stage 3 unspecified: Secondary | ICD-10-CM | POA: Diagnosis present

## 2014-05-05 DIAGNOSIS — Z9981 Dependence on supplemental oxygen: Secondary | ICD-10-CM

## 2014-05-05 DIAGNOSIS — J9611 Chronic respiratory failure with hypoxia: Secondary | ICD-10-CM | POA: Diagnosis not present

## 2014-05-05 DIAGNOSIS — I2729 Other secondary pulmonary hypertension: Secondary | ICD-10-CM

## 2014-05-05 DIAGNOSIS — E1122 Type 2 diabetes mellitus with diabetic chronic kidney disease: Secondary | ICD-10-CM | POA: Diagnosis not present

## 2014-05-05 DIAGNOSIS — J9621 Acute and chronic respiratory failure with hypoxia: Secondary | ICD-10-CM

## 2014-05-05 DIAGNOSIS — E785 Hyperlipidemia, unspecified: Secondary | ICD-10-CM | POA: Diagnosis present

## 2014-05-05 DIAGNOSIS — J189 Pneumonia, unspecified organism: Secondary | ICD-10-CM | POA: Diagnosis not present

## 2014-05-05 DIAGNOSIS — D869 Sarcoidosis, unspecified: Secondary | ICD-10-CM | POA: Diagnosis present

## 2014-05-05 DIAGNOSIS — I288 Other diseases of pulmonary vessels: Secondary | ICD-10-CM | POA: Diagnosis not present

## 2014-05-05 DIAGNOSIS — R0600 Dyspnea, unspecified: Secondary | ICD-10-CM

## 2014-05-05 DIAGNOSIS — I272 Other secondary pulmonary hypertension: Secondary | ICD-10-CM | POA: Diagnosis present

## 2014-05-05 DIAGNOSIS — Z6841 Body Mass Index (BMI) 40.0 and over, adult: Secondary | ICD-10-CM | POA: Diagnosis present

## 2014-05-05 DIAGNOSIS — IMO0002 Reserved for concepts with insufficient information to code with codable children: Secondary | ICD-10-CM

## 2014-05-05 DIAGNOSIS — Z8249 Family history of ischemic heart disease and other diseases of the circulatory system: Secondary | ICD-10-CM

## 2014-05-05 DIAGNOSIS — J9610 Chronic respiratory failure, unspecified whether with hypoxia or hypercapnia: Secondary | ICD-10-CM | POA: Diagnosis not present

## 2014-05-05 DIAGNOSIS — Z833 Family history of diabetes mellitus: Secondary | ICD-10-CM | POA: Diagnosis not present

## 2014-05-05 DIAGNOSIS — E119 Type 2 diabetes mellitus without complications: Secondary | ICD-10-CM | POA: Diagnosis not present

## 2014-05-05 DIAGNOSIS — E1165 Type 2 diabetes mellitus with hyperglycemia: Secondary | ICD-10-CM | POA: Diagnosis present

## 2014-05-05 DIAGNOSIS — R918 Other nonspecific abnormal finding of lung field: Secondary | ICD-10-CM | POA: Diagnosis not present

## 2014-05-05 DIAGNOSIS — Z87898 Personal history of other specified conditions: Secondary | ICD-10-CM

## 2014-05-05 DIAGNOSIS — I129 Hypertensive chronic kidney disease with stage 1 through stage 4 chronic kidney disease, or unspecified chronic kidney disease: Secondary | ICD-10-CM | POA: Diagnosis present

## 2014-05-05 DIAGNOSIS — K219 Gastro-esophageal reflux disease without esophagitis: Secondary | ICD-10-CM | POA: Diagnosis present

## 2014-05-05 DIAGNOSIS — J9612 Chronic respiratory failure with hypercapnia: Secondary | ICD-10-CM

## 2014-05-05 DIAGNOSIS — Z930 Tracheostomy status: Secondary | ICD-10-CM | POA: Diagnosis not present

## 2014-05-05 DIAGNOSIS — I5033 Acute on chronic diastolic (congestive) heart failure: Secondary | ICD-10-CM | POA: Diagnosis present

## 2014-05-05 DIAGNOSIS — I429 Cardiomyopathy, unspecified: Secondary | ICD-10-CM | POA: Diagnosis present

## 2014-05-05 DIAGNOSIS — R1310 Dysphagia, unspecified: Secondary | ICD-10-CM | POA: Diagnosis present

## 2014-05-05 DIAGNOSIS — G4733 Obstructive sleep apnea (adult) (pediatric): Secondary | ICD-10-CM | POA: Diagnosis present

## 2014-05-05 DIAGNOSIS — J690 Pneumonitis due to inhalation of food and vomit: Secondary | ICD-10-CM | POA: Diagnosis not present

## 2014-05-05 DIAGNOSIS — J9601 Acute respiratory failure with hypoxia: Secondary | ICD-10-CM | POA: Insufficient documentation

## 2014-05-05 DIAGNOSIS — I130 Hypertensive heart and chronic kidney disease with heart failure and stage 1 through stage 4 chronic kidney disease, or unspecified chronic kidney disease: Secondary | ICD-10-CM | POA: Diagnosis not present

## 2014-05-05 DIAGNOSIS — Z4682 Encounter for fitting and adjustment of non-vascular catheter: Secondary | ICD-10-CM | POA: Diagnosis not present

## 2014-05-05 DIAGNOSIS — R609 Edema, unspecified: Secondary | ICD-10-CM | POA: Diagnosis not present

## 2014-05-05 DIAGNOSIS — I517 Cardiomegaly: Secondary | ICD-10-CM | POA: Diagnosis not present

## 2014-05-05 HISTORY — DX: Sleep apnea, unspecified: G47.30

## 2014-05-05 HISTORY — DX: Acute and chronic respiratory failure with hypoxia: J96.21

## 2014-05-05 HISTORY — DX: Other complications of anesthesia, initial encounter: T88.59XA

## 2014-05-05 HISTORY — DX: Adverse effect of unspecified anesthetic, initial encounter: T41.45XA

## 2014-05-05 LAB — BLOOD GAS, ARTERIAL
Acid-Base Excess: 1.9 mmol/L (ref 0.0–2.0)
Bicarbonate: 28.3 mEq/L — ABNORMAL HIGH (ref 20.0–24.0)
Drawn by: 246861
FIO2: 0.6 %
O2 Saturation: 84 %
Patient temperature: 98.6
TCO2: 30.4 mmol/L (ref 0–100)
pCO2 arterial: 65.8 mmHg (ref 35.0–45.0)
pH, Arterial: 7.257 — ABNORMAL LOW (ref 7.350–7.450)
pO2, Arterial: 60.6 mmHg — ABNORMAL LOW (ref 80.0–100.0)

## 2014-05-05 LAB — COMPREHENSIVE METABOLIC PANEL
ALT: 15 U/L (ref 0–35)
AST: 25 U/L (ref 0–37)
Albumin: 3.3 g/dL — ABNORMAL LOW (ref 3.5–5.2)
Alkaline Phosphatase: 112 U/L (ref 39–117)
Anion gap: 8 (ref 5–15)
BUN: 18 mg/dL (ref 6–23)
CO2: 33 mmol/L — ABNORMAL HIGH (ref 19–32)
Calcium: 8.4 mg/dL (ref 8.4–10.5)
Chloride: 96 mEq/L (ref 96–112)
Creatinine, Ser: 1.5 mg/dL — ABNORMAL HIGH (ref 0.50–1.10)
GFR calc Af Amer: 48 mL/min — ABNORMAL LOW (ref >90)
GFR calc non Af Amer: 41 mL/min — ABNORMAL LOW (ref >90)
Glucose, Bld: 305 mg/dL — ABNORMAL HIGH (ref 70–99)
Potassium: 4.3 mmol/L (ref 3.5–5.1)
Sodium: 137 mmol/L (ref 135–145)
Total Bilirubin: 0.6 mg/dL (ref 0.3–1.2)
Total Protein: 6.8 g/dL (ref 6.0–8.3)

## 2014-05-05 LAB — URINE MICROSCOPIC-ADD ON

## 2014-05-05 LAB — CBC WITH DIFFERENTIAL/PLATELET
Basophils Absolute: 0 10*3/uL (ref 0.0–0.1)
Basophils Relative: 0 % (ref 0–1)
Eosinophils Absolute: 0.1 10*3/uL (ref 0.0–0.7)
Eosinophils Relative: 1 % (ref 0–5)
HCT: 45 % (ref 36.0–46.0)
Hemoglobin: 13.8 g/dL (ref 12.0–15.0)
Lymphocytes Relative: 15 % (ref 12–46)
Lymphs Abs: 1.6 10*3/uL (ref 0.7–4.0)
MCH: 29.9 pg (ref 26.0–34.0)
MCHC: 30.7 g/dL (ref 30.0–36.0)
MCV: 97.6 fL (ref 78.0–100.0)
Monocytes Absolute: 0.8 10*3/uL (ref 0.1–1.0)
Monocytes Relative: 7 % (ref 3–12)
Neutro Abs: 8.6 10*3/uL — ABNORMAL HIGH (ref 1.7–7.7)
Neutrophils Relative %: 77 % (ref 43–77)
Platelets: 201 10*3/uL (ref 150–400)
RBC: 4.61 MIL/uL (ref 3.87–5.11)
RDW: 14.3 % (ref 11.5–15.5)
WBC: 11.2 10*3/uL — ABNORMAL HIGH (ref 4.0–10.5)

## 2014-05-05 LAB — GLUCOSE, CAPILLARY
Glucose-Capillary: 264 mg/dL — ABNORMAL HIGH (ref 70–99)
Glucose-Capillary: 322 mg/dL — ABNORMAL HIGH (ref 70–99)
Glucose-Capillary: 353 mg/dL — ABNORMAL HIGH (ref 70–99)

## 2014-05-05 LAB — URINALYSIS, ROUTINE W REFLEX MICROSCOPIC
Glucose, UA: 100 mg/dL — AB
Ketones, ur: 15 mg/dL — AB
Nitrite: NEGATIVE
Protein, ur: 100 mg/dL — AB
Specific Gravity, Urine: 1.016 (ref 1.005–1.030)
Urobilinogen, UA: 1 mg/dL (ref 0.0–1.0)
pH: 5 (ref 5.0–8.0)

## 2014-05-05 LAB — LACTIC ACID, PLASMA: Lactic Acid, Venous: 2.5 mmol/L — ABNORMAL HIGH (ref 0.5–2.2)

## 2014-05-05 MED ORDER — PIPERACILLIN-TAZOBACTAM 3.375 G IVPB
3.3750 g | Freq: Three times a day (TID) | INTRAVENOUS | Status: DC
  Administered 2014-05-05 – 2014-05-08 (×8): 3.375 g via INTRAVENOUS
  Filled 2014-05-05 (×10): qty 50

## 2014-05-05 MED ORDER — CEFTRIAXONE SODIUM IN DEXTROSE 20 MG/ML IV SOLN
1.0000 g | INTRAVENOUS | Status: DC
  Filled 2014-05-05: qty 50

## 2014-05-05 MED ORDER — INSULIN DETEMIR 100 UNIT/ML ~~LOC~~ SOLN
20.0000 [IU] | Freq: Two times a day (BID) | SUBCUTANEOUS | Status: DC
  Administered 2014-05-05 – 2014-05-06 (×3): 20 [IU] via SUBCUTANEOUS
  Filled 2014-05-05 (×4): qty 0.2

## 2014-05-05 MED ORDER — HEPARIN SODIUM (PORCINE) 5000 UNIT/ML IJ SOLN
5000.0000 [IU] | Freq: Three times a day (TID) | INTRAMUSCULAR | Status: DC
Start: 2014-05-05 — End: 2014-05-09
  Administered 2014-05-05 – 2014-05-09 (×11): 5000 [IU] via SUBCUTANEOUS
  Filled 2014-05-05 (×12): qty 1

## 2014-05-05 MED ORDER — BENZONATATE 100 MG PO CAPS
100.0000 mg | ORAL_CAPSULE | Freq: Two times a day (BID) | ORAL | Status: DC | PRN
  Administered 2014-05-06 – 2014-05-07 (×2): 100 mg via ORAL
  Filled 2014-05-05 (×3): qty 1

## 2014-05-05 MED ORDER — FUROSEMIDE 10 MG/ML IJ SOLN
60.0000 mg | Freq: Two times a day (BID) | INTRAMUSCULAR | Status: DC
  Administered 2014-05-06 – 2014-05-08 (×5): 60 mg via INTRAVENOUS
  Filled 2014-05-05 (×7): qty 6

## 2014-05-05 MED ORDER — FUROSEMIDE 10 MG/ML IJ SOLN
40.0000 mg | Freq: Once | INTRAMUSCULAR | Status: DC
  Filled 2014-05-05: qty 4

## 2014-05-05 MED ORDER — ZOLPIDEM TARTRATE 5 MG PO TABS
5.0000 mg | ORAL_TABLET | Freq: Every evening | ORAL | Status: DC | PRN
  Filled 2014-05-05: qty 1

## 2014-05-05 MED ORDER — ALBUTEROL SULFATE (2.5 MG/3ML) 0.083% IN NEBU
5.0000 mg | INHALATION_SOLUTION | RESPIRATORY_TRACT | Status: DC
  Administered 2014-05-05 – 2014-05-07 (×8): 5 mg via RESPIRATORY_TRACT
  Filled 2014-05-05 (×7): qty 6

## 2014-05-05 MED ORDER — PANTOPRAZOLE SODIUM 40 MG PO TBEC: 40.0000 mg | DELAYED_RELEASE_TABLET | Freq: Once | ORAL | Status: DC

## 2014-05-05 MED ORDER — SODIUM CHLORIDE 0.9 % IJ SOLN
3.0000 mL | Freq: Two times a day (BID) | INTRAMUSCULAR | Status: DC
  Administered 2014-05-05 – 2014-05-09 (×5): 3 mL via INTRAVENOUS

## 2014-05-05 MED ORDER — LORATADINE 10 MG PO TABS
10.0000 mg | ORAL_TABLET | Freq: Every day | ORAL | Status: DC | PRN
  Filled 2014-05-05: qty 1

## 2014-05-05 MED ORDER — INSULIN ASPART 100 UNIT/ML ~~LOC~~ SOLN
0.0000 [IU] | Freq: Three times a day (TID) | SUBCUTANEOUS | Status: DC
  Administered 2014-05-06 (×2): 8 [IU] via SUBCUTANEOUS
  Administered 2014-05-06: 5 [IU] via SUBCUTANEOUS
  Administered 2014-05-07: 8 [IU] via SUBCUTANEOUS
  Administered 2014-05-07: 3 [IU] via SUBCUTANEOUS

## 2014-05-05 MED ORDER — ALBUTEROL SULFATE (2.5 MG/3ML) 0.083% IN NEBU
5.0000 mg | INHALATION_SOLUTION | RESPIRATORY_TRACT | Status: DC | PRN
  Filled 2014-05-05: qty 6

## 2014-05-05 MED ORDER — ATORVASTATIN CALCIUM 20 MG PO TABS
20.0000 mg | ORAL_TABLET | Freq: Every day | ORAL | Status: DC
  Administered 2014-05-05 – 2014-05-08 (×4): 20 mg via ORAL
  Filled 2014-05-05 (×5): qty 1

## 2014-05-05 MED ORDER — PREDNISONE 20 MG PO TABS
20.0000 mg | ORAL_TABLET | Freq: Every day | ORAL | Status: DC
  Administered 2014-05-06 – 2014-05-08 (×3): 20 mg via ORAL
  Filled 2014-05-05 (×4): qty 1

## 2014-05-05 MED ORDER — PANTOPRAZOLE SODIUM 40 MG PO TBEC
40.0000 mg | DELAYED_RELEASE_TABLET | Freq: Every day | ORAL | Status: DC
Start: 2014-05-05 — End: 2014-05-09
  Administered 2014-05-05 – 2014-05-09 (×5): 40 mg via ORAL
  Filled 2014-05-05 (×5): qty 1

## 2014-05-05 MED ORDER — SODIUM CHLORIDE 0.9 % IJ SOLN
3.0000 mL | Freq: Two times a day (BID) | INTRAMUSCULAR | Status: DC
  Administered 2014-05-08 – 2014-05-09 (×2): 3 mL via INTRAVENOUS

## 2014-05-05 MED ORDER — HEPARIN SODIUM (PORCINE) 5000 UNIT/ML IJ SOLN: 5000.0000 [IU] | Freq: Three times a day (TID) | INTRAMUSCULAR | Status: DC

## 2014-05-05 MED ORDER — FUROSEMIDE 10 MG/ML IJ SOLN
40.0000 mg | Freq: Once | INTRAMUSCULAR | Status: AC
Start: 2014-05-05 — End: 2014-05-05
  Administered 2014-05-05: 40 mg via INTRAVENOUS
  Filled 2014-05-05: qty 4

## 2014-05-05 MED ORDER — DEXTROSE 5 % IV SOLN
500.0000 mg | INTRAVENOUS | Status: DC
  Filled 2014-05-05: qty 500

## 2014-05-05 MED ORDER — DM-GUAIFENESIN ER 30-600 MG PO TB12
1.0000 | ORAL_TABLET | Freq: Two times a day (BID) | ORAL | Status: DC
  Administered 2014-05-05 – 2014-05-09 (×8): 1 via ORAL
  Filled 2014-05-05 (×9): qty 1

## 2014-05-05 MED ORDER — INSULIN ASPART 100 UNIT/ML ~~LOC~~ SOLN
0.0000 [IU] | Freq: Every day | SUBCUTANEOUS | Status: DC
  Administered 2014-05-05: 3 [IU] via SUBCUTANEOUS
  Administered 2014-05-06: 5 [IU] via SUBCUTANEOUS

## 2014-05-05 NOTE — Progress Notes (Signed)
Report was called to Nurse on [place].  Pt was transported via wheelchair to [place]  21.  Patient on 4 liters of Oxygen O2 sat. 90 %.  Family members in attendance.  Patient alert and oriented.  Tolerated transport well.  Sander Nephew, RN 05/05/2014 5:25 PM

## 2014-05-05 NOTE — Progress Notes (Signed)
KESS[facility]ATE [id number] Code Status: full   Admission Data: 05/05/2014 6:24 PM Attending Provider:  Beryle Beams [id number], Blanch Media, MD Consults/ Treatment Team:    Melody Gray is a 44 y.o. female patient admitted from ED awake, alert - oriented  X 3 - no acute distress noted.  VSS - Blood pressure 122/78, pulse 106, temperature 99.1 F (37.3 C), temperature source Oral, resp. rate 22, height 5\' 1"  (1.549 m), weight 112.22 kg (247 lb 6.4 oz), last menstrual period 04/29/2014, SpO2 91 %.    IV in place, occlusive dsg intact without redness.  Orientation to room, and floor completed with information packet given to patient/family.  Patient declined safety video at this time.  Admission INP armband ID verified with patient/family, and in place.   SR up x 2, fall assessment complete, with patient and family able to verbalize understanding of risk associated with falls, and verbalized understanding to call nsg before up out of bed.  Call light within reach, patient able to voice, and demonstrate understanding.  Skin, clean-dry- intact without evidence of bruising, or skin tears.   No evidence of skin break down noted on exam.     Will cont to eval and treat per MD orders.  Tahra Hitzeman, Helen Hashimoto, RN 05/05/2014 6:24 PM

## 2014-05-05 NOTE — H&P (Signed)
Date: 05/05/2014               Patient Name:  Melody Gray MRN: [id number]  DOB: [birth year] Age / Sex: 44 y.o., female   PCP: Wilber Oliphant, MD         Medical Service: Internal Medicine Teaching Service         Attending Physician: Dr. Sid Falcon, MD    First Contact: Dr. Julious Oka Pager: [phone]  Second Contact: Dr. 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 She notes that for the past two months she has had lower extremity edema. She was seen in the ED in 03/2014 and sent home. Her LE edema was stable until about two weeks ago when it began increasing. She thinks she has gained about 15 lbs over the past two months. She also developed a cough a few days ago that is productive of a white sputum. She thinks she choked while eating about two weeks ago. Over the past day she has felt short of breath. She has had to use her albuterol inhaler more recently. She also noted increase from her baseline orthopnea and congestion. Her home blood sugars have been elevated the past few days. She denies any fevers, chills, night sweats, sick contacts, chest pain, palpitations, abdominal pain, nausea, emesis, diarrhea, constipation, headache, vision change, numbness, paresthesia, weakness.  She came into the [facility] clinic today for these symptoms and was noted to have an oxygen saturation of 59% on room air. This increased to 70% on 2L and then the high 80s, low 90s on 4L. She had a stat CXR that Drs. Hoffman and Churchill reviewed to find a RLL infiltrate and decided to directly admit her to the  hospital.  Meds: Current Facility-Administered Medications  Medication Dose Route Frequency Provider Last Rate Last Dose  . albuterol (PROVENTIL) (2.5 MG/3ML) 0.083% nebulizer solution 5 mg  5 mg Nebulization Q4H Marjan Rabbani, MD      . albuterol (PROVENTIL) (2.5 MG/3ML) 0.083% nebulizer solution 5 mg  5 mg Nebulization Q2H PRN Marjan Rabbani, MD      . atorvastatin (LIPITOR) tablet 20 mg  20 mg Oral q1800 Marjan Rabbani, MD      . benzonatate (TESSALON) capsule 100 mg  100 mg Oral BID PRN Marjan Rabbani, MD      . dextromethorphan-guaiFENesin ([place] DM) 30-600 MG per 12 hr tablet 1 tablet  1 tablet Oral BID Juluis Mire, MD      . Derrill Memo ON 05/06/2014] furosemide (LASIX) injection 60 mg  60 mg Intravenous BID Marjan Rabbani, MD      . heparin injection 5,000 Units  5,000 Units Subcutaneous 3 times per day Juluis Mire, MD      . Derrill Memo ON 05/06/2014] insulin aspart (novoLOG) injection 0-15 Units  0-15 Units Subcutaneous TID WC Lucious Groves, DO      . insulin aspart (novoLOG) injection 0-5 Units  0-5 Units Subcutaneous QHS Lucious Groves, DO      . insulin detemir (LEVEMIR) injection 20 Units  20 Units Subcutaneous BID Lucious Groves,  DO      . pantoprazole (PROTONIX) EC tablet 40 mg  40 mg Oral Daily Kelby Aline, MD      . piperacillin-tazobactam (ZOSYN) IVPB 3.375 g  3.375 g Intravenous 3 times per day Rocky Crafts Hammons, RPH      . [START ON 05/06/2014] predniSONE (DELTASONE) tablet 20 mg  20 mg Oral Q breakfast Marjan Rabbani, MD      . sodium chloride 0.9 % injection 3 mL  3 mL Intravenous Q12H Lucious Groves, DO      . sodium chloride 0.9 % injection 3 mL  3 mL Intravenous Q12H Juluis Mire, MD        Allergies: Allergies as of 05/05/2014 - Review Complete 05/05/2014  Allergen Reaction Noted  . Vicodin [hydrocodone-acetaminophen] Itching 11/17/2010   Past Medical History  Diagnosis Date  . Sarcoidosis     Followed by Dr. Melvyn Novas; w/ liver involvement per biopsy 12/09,  Reversible airway component so started on [facility] 01/2010; HFA 75% p coaching 05/2010  . Hypoxemia     CT angiogram 9/11>> No PE; PFTs 10/11- FEV1 1.20 (49%) with 16% better p B2, DLCO 33%> corrects to 84; O2 sats ok on 4 lpm X rapid walk X 3 laps 05/2010  . Morbid obesity     Target wt= 153 for BMI <30  . QT prolongation   . Hypertension   . Hx of cardiac cath 2/08    No CAD, no RAS,  normal EF  . Seborrheic dermatitis of scalp   . 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 Hep B serology neg. but Hep C ab +, HIV neg. 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 Cardiomyopathy, nonischemic     EF 45% 12/10; Echo 7/11 normal EF, PAS 48  . Diabetic retinopathy     Right eye 2/11  . Health maintenance examination     Mammogram 05/2010 Negative; Last Pap smear 03/2008; Last DM eye exam 2/11> mild non-proliferative diabetic retinopathy. OD  . Helicobacter pylori ab+ 05/2011    Pt was symptomatic and treatment planned for 05/2011  . CHF (congestive heart failure)   . Complication of anesthesia     " difficult waking "  . Sleep apnea   . Diabetes mellitus     insulin dependent   Past Surgical History  Procedure Laterality Date  . Tubal ligation    . Breast surgery    . Cesarean section    . Tracheostomy tube placement N/A 08/10/2013    Procedure: TRACHEOSTOMY;  Surgeon: Melissa Montane, MD;  Location: [facility] OR;  Service: ENT;  Laterality: N/A;   Family History  Problem Relation Age of Onset  . Cancer Mother     colon cancer  . Multiple sclerosis Father   . Asthma Sister     in childhood  . Diabetes Father   . Hypertension     History   Social History  . Marital Status: Single    Spouse Name: N/A    Number of Children: 2  . Years of Education: N/A   Occupational History  . 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 Smoking status: Former Smoker -- 0.30 packs/day for 20 years    Quit date:  06/29/2013  . Smokeless tobacco: Never Used     Comment: Needs to cut back.  . Alcohol  Use: No  . Drug Use: No  . Sexual Activity: Not on file   Other Topics Concern  . Not on file   Social History Narrative   Diabetic card given 05/03/2010.   Financial assistance approved for 100% discount at [facility] and has [facility] card. [name] 12/07/2009.      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    Review of Systems: A comprehensive review of systems was negative except for: as noted above per HPI  Physical Exam: Blood pressure 122/78, pulse 101, temperature 99.1 F (37.3 C), temperature source Oral, resp. rate 20, height 5\' 1"  (1.549 m), weight 247 lb 6.4 oz (112.22 kg), last menstrual period 04/29/2014, SpO2 94 %.  Gen: A&O x 4, no acute distress, obese, trach in place HEENT: Atraumatic, PERRL, EOMI, sclerae anicteric, moist mucous membranes Neck: Supple, no carotid bruits or JVD, trach in place Heart: Regular rate and rhythm, normal S1 S2, no murmurs, rubs, or gallops Lungs: bronchial breath sounds on R side, respirations unlabored Abd: Soft, non-tender, non-distended, + bowel sounds, no hepatosplenomegaly Ext: 2+ pitting edema on L leg through calf, 1-2+ on the R leg through calf  Lab results: Basic Metabolic Panel:  Recent Labs  05/05/14 1655  NA 137  K 4.3  CL 96  CO2 33*  GLUCOSE 305*  BUN 18  CREATININE 1.50*  CALCIUM 8.4   Liver Function Tests:  Recent Labs  05/05/14 1655  AST 25  ALT 15  ALKPHOS 112  BILITOT 0.6  PROT 6.8  ALBUMIN 3.3*   CBC:  Recent Labs  05/05/14 1705  WBC 11.2*  NEUTROABS 8.6*  HGB 13.8  HCT 45.0  MCV 97.6  PLT 201   CBG:  Recent Labs  05/05/14 1612 05/05/14 1734  GLUCAP 264* 322*   ABG pH 7.26, pCO2 66, pO2 61, bicarb 28  Imaging results:  Dg Chest 2 View  05/05/2014   CLINICAL DATA:  Respiratory distress x2 days.  EXAM: CHEST  2 VIEW  COMPARISON:  CT 03/27/2014.  Chest x-ray 03/27/2014 .   FINDINGS: Tracheostomy tube in good anatomic position. Cardiomegaly with bilateral pulmonary alveolar infiltrates suggesting congestive heart failure an pulmonary edema. Bilateral pneumonia cannot be excluded. No pleural effusion or pneumothorax.  IMPRESSION: 1. Tracheostomy tube in good anatomic position. 2. Findings suggesting congestive heart failure with bilateral pulmonary edema. Bilateral pneumonia cannot be excluded .   Electronically Signed   By: Marcello Moores  Register   On: 05/05/2014 17:47    Other results: EKG: pending  Assessment & Plan by Problem: Principal Problem:   Acute on chronic diastolic congestive heart failure Active Problems:   Sarcoidosis   Diabetes mellitus type II, uncontrolled   OBESITY, MORBID   Essential hypertension   History of prolonged Q-T interval on ECG   Hyperlipidemia with target LDL less than 100   Tracheostomy status   Aspiration pneumonia   Acute on chronic respiratory failure with hypoxemia  #Sepsis secondary to CAP: Ms Shrader has CAP. She has a productive cough with radiologic evidence of infection. While she is afebrile, she has leukocytosis of 11.2, is tachycardic, and tachypnic. Her lactic acid was elevated at 2.5. This makes her septic as she has 3 of 4 SIRS criteria (leukocytosis, tachycardia, tachypnea) with suspected infection source on CXR. She does not have HCAP as she has not been recently hospitalized. CXR notes suspected bilateral pneumonia and appears to have a RLL infiltrate which would be consistent with an  aspiration. She does note that she choked about two weeks ago eating so she requires anaerobic coverage. She was originally ordered for azithromycin and ceftriaxone but we are changing this to zosyn for anaerobic coverage. Also consider she has documented prolonged QT on problem list but most recent EKG from 03/30/14 had QTc 439 and the EKG ordered today is pending.  -sputum culture -BCx x 2 -zosyn per pharmacy -respiratory virus  panel -influenza panel -legionella and strep pneumo urine antigen -UA -repeat, CBC, CMP, lactic acid, CXR in am -cardiac monitoring -tessalon 100 mg po bidprn -mucinex dm 1 tab po bid -speech eval  #Acute on chronic diastolic CHF: Last echo [id number] notable for EF 123456, grade 2 diastolic dysfunction. CXR was suggestive for CHF with bilateral pulmonary edema. This is consistent with LE swelling noted on exam. She takes lasix 40 mg daily at home. She will receive lasix 40 mg iv once and then reassess. While her leg swelling is asymmetric (L>R), she had negative doppler for DVT 12/17/13 and negative CTA 03/27/14 for PE. -lasix 60 mg iv bid for now then reassess -check BNP -repeat CXR in am to see if improvement  -cardiac monitoring -daily weight, I&O's  #Chronic respiratory failure s/p tracheostomy: Ms Roessner underwent emergent tracheostomy in 07/2013 due to hypercarbic respiratory failure by ENT after anesthesia was unable to intubate her. She had another episode of respiratory failure in 10/2013 and had her trach changed from cuffless to cuffed but was discharged with a cuffless 6 shiley. She has a history of pulmonary hypertension related to her OSA and sarcoidosis. Today her ABG here was notable for a chronic respiratory acidosis with pH 7.26, pCO2 66, pO2 61, bicarb 28. -albuterol 5 mg neb q4h and q2hprn -O2 per nasal cannula w sat 88-91%  #Sarcoidosis: Ms. Mohseni sarcoidosis is followed by Dr. Melvyn Novas. She is on chronic steroids w prednisone alternating 5 and 10 mg daily. No recent flares per the patient.  -prednisone 20 mg daily as stress dose  #CKD stage 3: Likely secondary to her DM2 and HTN. Her baseline creatinine for the past year appears to be around 1.2-1.3. On presentation today her creatinine is 1.5 with a GFR of 48. Not quite AKI. -UA -cont to monitor  #DM2: Last hemoglobin A1c was 8.5% on 02/19/14. Her home regimen is levemir 28 u am and 20 u pm, a sliding scale of novolog with  meals, and metformin 1000 mg bid.  -levemir 20 u bid -ssi moderate with hs coverage  #HTN:  Presenting BP 122/78. Home antihypertensive regimen is amlodipine 5 mg daily, carvedilol 12.5 mg bid, irbesartan 150 mg daily, and lasix 40 mg daily -cont lasix as above -hold amlodipine 5 mg daily, carvedilol 12.5 mg bid, irbesartan 150 mg daily for now  #HL: Ms Alles's last lipid panel was 01/02/14 with total cholesterol 238, triglyceride 252, HDL 70, LDL 118. She is on atorvastatin 20 mg daily at home -cont atorvastatin 20 mg daily   #GERD: protonix 40 mg daily  #Diet: carb modified  #DVT PPx: heparin 5000 u Vera tid  #Code: full  Dispo: Disposition is deferred at this time, awaiting improvement of current medical problems. Anticipated discharge in approximately 2 day(s).   The patient does have a current PCP Wilber Oliphant, MD) and does need an [facility] hospital follow-up appointment after discharge.  The patient does not know have transportation limitations that hinder transportation to clinic appointments.  Signed: Kelby Aline, MD 05/05/2014, 9:27 PM

## 2014-05-05 NOTE — Progress Notes (Signed)
Paged on call MD about ABG and lab results. (Lactic acid)

## 2014-05-05 NOTE — Progress Notes (Signed)
ANTIBIOTIC CONSULT NOTE - INITIAL  Pharmacy Consult for Zosyn Indication: aspiration pna  Allergies  Allergen Reactions  . Vicodin [Hydrocodone-Acetaminophen] Itching    Patient Measurements: Height: 5\' 1"  (154.9 cm) Weight: 247 lb 6.4 oz (112.22 kg) IBW/kg (Calculated) : 47.8  Vital Signs: Temp: 99.1 F (37.3 C) (01/05 1743) Temp Source: Oral (01/05 1743) BP: 122/78 mmHg (01/05 1743) Pulse Rate: 106 (01/05 1743) Intake/Output from previous day:   Intake/Output from this shift:    Labs:  Recent Labs  05/05/14 1655 05/05/14 1705  WBC  --  11.2*  HGB  --  13.8  PLT  --  201  CREATININE 1.50*  --    Estimated Creatinine Clearance: 55 mL/min (by C-G formula based on Cr of 1.5).    Microbiology: No results found for this or any previous visit (from the past 720 hour(s)).  Medical History: Past Medical History  Diagnosis Date  . Sarcoidosis     Followed by Dr. Melvyn Novas; w/ liver involvement per biopsy 12/09, Reversible airway component so started on [facility] 01/2010; HFA 75% p coaching 05/2010  . Hypoxemia     CT angiogram 9/11>> No PE; PFTs 10/11- FEV1 1.20 (49%) with 16% better p B2, DLCO 33%> corrects to 84; O2 sats ok on 4 lpm X rapid walk X 3 laps 05/2010  . Morbid obesity     Target wt= 153 for BMI <30  . QT prolongation   . Hypertension   . Hx of cardiac cath 2/08    No CAD, no RAS,  normal EF  . Seborrheic dermatitis of scalp   . 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 Hep B serology neg. but Hep C ab +, HIV neg. 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 Cardiomyopathy, nonischemic     EF 45% 12/10; Echo 7/11 normal EF, PAS 48  . Diabetic retinopathy     Right eye 2/11  . Health maintenance examination     Mammogram 05/2010 Negative; Last Pap smear 03/2008; Last DM eye exam 2/11> mild non-proliferative diabetic retinopathy. OD  . Helicobacter pylori ab+ 05/2011    Pt was symptomatic  and treatment planned for 05/2011  . CHF (congestive heart failure)   . Complication of anesthesia     " difficult waking "  . Sleep apnea   . Diabetes mellitus     insulin dependent    Medications:  Prescriptions prior to admission  Medication Sig Dispense Refill Last Dose  . albuterol (PROVENTIL HFA;VENTOLIN HFA) 108 (90 BASE) MCG/ACT inhaler Inhale 2 puffs into the lungs every 6 (six) hours as needed for wheezing or shortness of breath. 1 Inhaler 2 05/05/2014 at Unknown time  . amLODipine (NORVASC) 5 MG tablet Take 1 tablet (5 mg total) by mouth daily. 30 tablet 5 05/05/2014 at Unknown time  . atorvastatin (LIPITOR) 20 MG tablet Take 1 tablet (20 mg total) by mouth daily at 6 PM. 90 tablet 3 05/04/2014 at Unknown time  . carvedilol (COREG) 12.5 MG tablet Take 1 tablet (12.5 mg total) by mouth 2 (two) times daily. 180 tablet 3 05/05/2014 at 1100  . ferrous sulfate 325 (65 FE) MG tablet Take 1 tablet (325 mg total) by mouth 3 (three) times daily with meals. 90 tablet 3 05/04/2014 at Unknown time  . furosemide (LASIX) 40 MG tablet Take 40 mg by mouth daily.   05/05/2014 at Unknown time  .  insulin aspart (NOVOLOG) 100 UNIT/ML injection Take novolog per sliding scale: CBG 70-120: 0 units  CBG 121-150: 1 unit  CBG 151-200: 2 units  CBG 201-250: 3 units  CBG 251-300: 5 units  CBG 301-350: 7 units  CBG 351-400: 9 units  CBG > 400: call MD 30 mL 3 05/05/2014 at Unknown time  . insulin detemir (LEVEMIR) 100 UNIT/ML injection 28 units every morning and 20 units every night. diag code 250.00   05/05/2014 at Unknown time  . irbesartan (AVAPRO) 150 MG tablet Take 1 tablet by mouth daily.   Past Month at Unknown time  . metFORMIN (GLUCOPHAGE) 1000 MG tablet Take 1 tablet by mouth 2 (two) times daily.   05/05/2014 at Unknown time  . omeprazole (PRILOSEC) 20 MG capsule Take 1 capsule (20 mg total) by mouth daily. 30 capsule 3 05/05/2014 at Unknown time  . predniSONE (DELTASONE) 10 MG tablet Take 5-10 mg by mouth daily  with breakfast. Alternate between 5mg  and 10mg    05/05/2014 at Unknown time  . azithromycin (ZITHROMAX Z-PAK) 250 MG tablet Take as directed. (Patient not taking: Reported on 05/05/2014) 6 each 0 Completed Course at Unknown time  . Dextromethorphan-Guaifenesin (MUCINEX DM MAXIMUM STRENGTH) 60-1200 MG TB12 One every 12 hours as needed for cough   Unk  . Melatonin 3 MG TABS Take 1 tablet (3 mg total) by mouth at bedtime as needed. (Patient taking differently: Take 3 mg by mouth at bedtime as needed (sleep). )  0 Unk  . predniSONE (DELTASONE) 10 MG tablet take 1 tablet by mouth once daily WITH BREAKFAST (Patient not taking: Reported on 05/05/2014) 30 tablet 2 Not Taking at Unknown time   Assessment: 44 yo F presented to [facility] IM clinic with dyspnea and increased swelling of her LE.  Pt has a hx of COPD and pulm hypertension on chronic O2 PTA.  CXR showed RLL infiltrate concerning for PNA.  SCr 1.5, WBC 11.2 (on chronic steroids), T 99.1.  Goal of Therapy:  Renal dose adjustment of antibiotics  Plan:  Zosyn 3.375 gm IV q8h (4 hour infusion). No further dosage adjustments anticipated. Pharmacy will sign off.  [employer], Pharm.D., BCPS Clinical Pharmacist Pager [phone] 05/05/2014 8:57 PM

## 2014-05-05 NOTE — Assessment & Plan Note (Signed)
-  RLL infiltrate, bronchiole breath sounds in an immunosuppressed patient with underlying lung disease. - Admit to inpatient, obtained blood cultures, will start IV abx.

## 2014-05-05 NOTE — Progress Notes (Signed)
[facility] Subjective:   Patient ID: Melody Gray female   DOB: 1970-09-19 44 y.o.   MRN: [id number]  HPI: [name] is a 44 y.o. 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 She reports that over the past 2 months she has had some mildly increased swelling of her bilateral lower extremities (although her left is always larger than the right), she notes that however over the past 2 days she has become increasingly SOB to the point she turned up her home O2 to 3 L yesterday.  On presentation to our clinic she was found to be hypoxic to the high 50s O2Sat, which improved to the 70s on 2L via [place].   She denies any fever or chills, any sick contacts, she has had a productive cough with whiteish sputum. She reports she has been taking her medications and insulin as directed, however also notes that she has been hyperglycemic for the past few days as well.  She has however be without her irbesartan for 3 days she reports.    Past Medical History  Diagnosis Date  . Sarcoidosis     Followed by Dr. Melvyn Novas; w/ liver involvement per biopsy 12/09, Reversible airway component so started on [facility] 01/2010; HFA 75% p coaching 05/2010  . Hypoxemia     CT angiogram 9/11>> No PE; PFTs 10/11- FEV1 1.20 (49%) with 16% better p B2, DLCO 33%> corrects to 84; O2 sats ok on 4 lpm X rapid walk X 3 laps 05/2010  . Morbid obesity     Target wt= 153 for BMI <30  . QT prolongation   . Diabetes mellitus   . Hypertension   . Hx of cardiac cath 2/08    No CAD, no RAS,  normal EF  . Seborrheic dermatitis of scalp   . 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 Hep B serology neg. but Hep C ab +, HIV neg. 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 Cardiomyopathy,  nonischemic     EF 45% 12/10; Echo 7/11 normal EF, PAS 48  . Diabetic retinopathy     Right eye 2/11  . Health maintenance examination     Mammogram 05/2010 Negative; Last Pap smear 03/2008; Last DM eye exam 2/11> mild non-proliferative diabetic retinopathy. OD  . Helicobacter pylori ab+ 05/2011    Pt was symptomatic and treatment planned for 05/2011  . CHF (congestive heart failure)    No current facility-administered medications for this visit.   No current outpatient prescriptions on file.   Facility-Administered Medications Ordered in Other Visits  Medication Dose Route Frequency Provider Last Rate Last Dose  . azithromycin (ZITHROMAX) 500 mg in dextrose 5 % 250 mL IVPB  500 mg Intravenous Q24H Lucious Groves, DO      . cefTRIAXone (ROCEPHIN) 1 g in dextrose 5 % 50 mL IVPB - Premix  1 g Intravenous Q24H Lucious Groves, DO      . furosemide (LASIX) injection 40 mg  40 mg Intravenous Once Lucious Groves, DO      . heparin injection 5,000 Units  5,000 Units Subcutaneous 3 times per day Lucious Groves, DO      . [START ON 05/06/2014] insulin aspart (novoLOG) injection 0-15  Units  0-15 Units Subcutaneous TID WC Lucious Groves, DO      . insulin aspart (novoLOG) injection 0-5 Units  0-5 Units Subcutaneous QHS Lucious Groves, DO      . insulin detemir (LEVEMIR) injection 20 Units  20 Units Subcutaneous BID Lucious Groves, DO      . sodium chloride 0.9 % injection 3 mL  3 mL Intravenous Q12H Lucious Groves, DO       Family History  Problem Relation Age of Onset  . Cancer Mother     colon cancer  . Multiple sclerosis Father   . Asthma Sister     in childhood  . Diabetes Father   . Hypertension     History   Social History  . Marital Status: Single    Spouse Name: N/A    Number of Children: 2  . Years of Education: N/A   Occupational History  . 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 Smoking status: Former Smoker -- 0.30 packs/day for 20 years    Quit date:  06/29/2013  . Smokeless tobacco: Never Used     Comment: Needs to cut back.  . Alcohol Use: No  . Drug Use: No  . Sexual Activity: None   Other Topics Concern  . None   Social History Narrative   Diabetic card given 05/03/2010.   Financial assistance approved for 100% discount at [facility] and has [facility] card. [name] 12/07/2009.      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   Review of Systems: Review of Systems  Constitutional: Negative for fever, chills and weight loss.  Respiratory: Positive for cough, sputum production (white) and shortness of breath.   Cardiovascular: Positive for leg swelling. Negative for chest pain and orthopnea.  Gastrointestinal: Negative for abdominal pain, diarrhea and constipation.  Genitourinary: Negative for dysuria.  Musculoskeletal: Negative for myalgias.     Objective:  Physical Exam: Filed Vitals:   05/05/14 1527 05/05/14 1535 05/05/14 1544 05/05/14 1613  BP: 127/74     Pulse: 103     Weight: 247 lb (112.038 kg)     SpO2: 79% 81% 89% 86%  Physical Exam  Constitutional:  Trach with passy-muir valve, ill appearing  Cardiovascular: Normal rate and regular rhythm.   No murmur heard. Pulmonary/Chest: Effort normal. No respiratory distress. She exhibits no tenderness.  Bronchial breath sounds of lower and middle right lung  Abdominal: She exhibits no distension. There is no rebound.  Musculoskeletal: She exhibits edema (2+ pitting edema of left leg, 1-2+ pitting edema of right leg).  Psychiatric: Affect normal.  Nursing note and vitals reviewed.   Assessment & Plan:  Case discussed and patient seen with Dr. Eppie Gibson  Acute on chronic respiratory failure Patient presented acutely hypoxic despite addition of her home O2.  We obtained a STAT CXR which I read with dr. Virgina Jock to be a RLL infiltrate. Contributory findings for PNA to cause this acute respiratory failure are her cough and white sputum.  She does not have a  fever but is on chronic steroids. - We will directly admit her to telemetery. - I have obtained a CBC, CMP, lactic acid, and Blood Cultures x2 - Will place temporary admitting orders to ensure no time delay and start IV ceftriaxone and azithromycin - Patient does have a component of lower extremity edema and appear to have some mild vascular congestion on  CXR, and likely has a secondary contributing CHF, will order IV lasix 40mg  x1 now and evaluated I&O. - Do not suspect DVT/PE at this time if patient does not improve with Abx and diuresis we may need to reconsider this.  PNA (pneumonia) -RLL infiltrate, bronchiole breath sounds in an immunosuppressed patient with underlying lung disease. - Admit to inpatient, obtained blood cultures, will start IV abx.    Medications Ordered No orders of the defined types were placed in this encounter.   Other Orders Orders Placed This Encounter  Procedures  . Culture, Blood  . Culture, Blood  . DG Chest 2 View    Order Specific Question:  Reason for Exam (SYMPTOM  OR DIAGNOSIS REQUIRED)    Answer:  Chronic sarcoidosis with increased LE swelling, increased cough, and hypoxemia.  Exam suggestive of RLL pneumonia.  Please assess for anatomic causes of her acute decompensation.  Thank You.    Order Specific Question:  Is the patient pregnant?    Answer:  No    Order Specific Question:  Preferred imaging location?    Answer:  [facility]    Order Specific Question:  Call Report- Best Contact Number?    AnswerBN:[phone]  . Glucose, capillary  . Comprehensive metabolic panel  . Lactic Acid  . CBC with Diff

## 2014-05-05 NOTE — Assessment & Plan Note (Addendum)
Patient presented acutely hypoxic despite addition of her home O2.  We obtained a STAT CXR which I read with dr. Virgina Jock to be a RLL infiltrate. Contributory findings for PNA to cause this acute respiratory failure are her cough and white sputum.  She does not have a fever but is on chronic steroids. - We will directly admit her to telemetery. - I have obtained a CBC, CMP, lactic acid, and Blood Cultures x2 - Will place temporary admitting orders to ensure no time delay and start IV ceftriaxone and azithromycin - Patient does have a component of lower extremity edema and appear to have some mild vascular congestion on CXR, and likely has a secondary contributing CHF, will order IV lasix 40mg  x1 now and evaluated I&O. - Do not suspect DVT/PE at this time if patient does not improve with Abx and diuresis we may need to reconsider this.

## 2014-05-05 NOTE — Progress Notes (Signed)
Unit CM UR Completed by [facility] ED CM  W. Janisha Bueso RN  

## 2014-05-06 ENCOUNTER — Inpatient Hospital Stay (HOSPITAL_COMMUNITY): Payer: Medicare Other

## 2014-05-06 DIAGNOSIS — D869 Sarcoidosis, unspecified: Secondary | ICD-10-CM

## 2014-05-06 DIAGNOSIS — E785 Hyperlipidemia, unspecified: Secondary | ICD-10-CM

## 2014-05-06 DIAGNOSIS — Z9981 Dependence on supplemental oxygen: Secondary | ICD-10-CM

## 2014-05-06 DIAGNOSIS — N183 Chronic kidney disease, stage 3 (moderate): Secondary | ICD-10-CM

## 2014-05-06 DIAGNOSIS — J9601 Acute respiratory failure with hypoxia: Secondary | ICD-10-CM | POA: Diagnosis present

## 2014-05-06 DIAGNOSIS — I129 Hypertensive chronic kidney disease with stage 1 through stage 4 chronic kidney disease, or unspecified chronic kidney disease: Secondary | ICD-10-CM

## 2014-05-06 DIAGNOSIS — E119 Type 2 diabetes mellitus without complications: Secondary | ICD-10-CM

## 2014-05-06 DIAGNOSIS — A419 Sepsis, unspecified organism: Principal | ICD-10-CM

## 2014-05-06 DIAGNOSIS — J9610 Chronic respiratory failure, unspecified whether with hypoxia or hypercapnia: Secondary | ICD-10-CM

## 2014-05-06 DIAGNOSIS — J189 Pneumonia, unspecified organism: Secondary | ICD-10-CM | POA: Insufficient documentation

## 2014-05-06 DIAGNOSIS — R609 Edema, unspecified: Secondary | ICD-10-CM

## 2014-05-06 DIAGNOSIS — I5033 Acute on chronic diastolic (congestive) heart failure: Secondary | ICD-10-CM

## 2014-05-06 DIAGNOSIS — K219 Gastro-esophageal reflux disease without esophagitis: Secondary | ICD-10-CM

## 2014-05-06 LAB — EXPECTORATED SPUTUM ASSESSMENT W GRAM STAIN, RFLX TO RESP C

## 2014-05-06 LAB — GLUCOSE, CAPILLARY
Glucose-Capillary: 297 mg/dL — ABNORMAL HIGH (ref 70–99)
Glucose-Capillary: 279 mg/dL — ABNORMAL HIGH (ref 70–99)
Glucose-Capillary: 247 mg/dL — ABNORMAL HIGH (ref 70–99)
Glucose-Capillary: 382 mg/dL — ABNORMAL HIGH (ref 70–99)

## 2014-05-06 LAB — INFLUENZA PANEL BY PCR (TYPE A & B)
H1N1 flu by pcr: NOT DETECTED
Influenza A By PCR: NEGATIVE
Influenza B By PCR: NEGATIVE

## 2014-05-06 LAB — BASIC METABOLIC PANEL
Anion gap: 9 (ref 5–15)
BUN: 23 mg/dL (ref 6–23)
CO2: 32 mmol/L (ref 19–32)
Calcium: 8.3 mg/dL — ABNORMAL LOW (ref 8.4–10.5)
Chloride: 97 mEq/L (ref 96–112)
Creatinine, Ser: 1.76 mg/dL — ABNORMAL HIGH (ref 0.50–1.10)
GFR calc Af Amer: 39 mL/min — ABNORMAL LOW (ref >90)
GFR calc non Af Amer: 34 mL/min — ABNORMAL LOW (ref >90)
Glucose, Bld: 236 mg/dL — ABNORMAL HIGH (ref 70–99)
Potassium: 4.4 mmol/L (ref 3.5–5.1)
Sodium: 138 mmol/L (ref 135–145)

## 2014-05-06 LAB — HEMOGLOBIN A1C
Hgb A1c MFr Bld: 8.6 % — ABNORMAL HIGH (ref <5.7)
Mean Plasma Glucose: 200 mg/dL — ABNORMAL HIGH (ref <117)

## 2014-05-06 LAB — PROCALCITONIN: Procalcitonin: 0.1 ng/mL

## 2014-05-06 LAB — CBC
HCT: 44.9 % (ref 36.0–46.0)
Hemoglobin: 13.9 g/dL (ref 12.0–15.0)
MCH: 30.8 pg (ref 26.0–34.0)
MCHC: 31 g/dL (ref 30.0–36.0)
MCV: 99.3 fL (ref 78.0–100.0)
Platelets: 217 10*3/uL (ref 150–400)
RBC: 4.52 MIL/uL (ref 3.87–5.11)
RDW: 14.2 % (ref 11.5–15.5)
WBC: 10.8 10*3/uL — ABNORMAL HIGH (ref 4.0–10.5)

## 2014-05-06 LAB — PHOSPHORUS: Phosphorus: 5.3 mg/dL — ABNORMAL HIGH (ref 2.3–4.6)

## 2014-05-06 LAB — LACTIC ACID, PLASMA: Lactic Acid, Venous: 1.4 mmol/L (ref 0.5–2.2)

## 2014-05-06 LAB — BRAIN NATRIURETIC PEPTIDE: B Natriuretic Peptide: 341.1 pg/mL — ABNORMAL HIGH (ref 0.0–100.0)

## 2014-05-06 LAB — MRSA PCR SCREENING: MRSA by PCR: POSITIVE — AB

## 2014-05-06 LAB — C-REACTIVE PROTEIN: CRP: 8.7 mg/dL — ABNORMAL HIGH (ref <0.60)

## 2014-05-06 LAB — MAGNESIUM: Magnesium: 1.2 mg/dL — ABNORMAL LOW (ref 1.5–2.5)

## 2014-05-06 MED ORDER — CHLORHEXIDINE GLUCONATE CLOTH 2 % EX PADS
6.0000 | MEDICATED_PAD | Freq: Every day | CUTANEOUS | Status: DC
  Administered 2014-05-06 – 2014-05-09 (×4): 6 via TOPICAL

## 2014-05-06 MED ORDER — SODIUM CHLORIDE 0.9 % IV SOLN
1500.0000 mg | INTRAVENOUS | Status: DC
  Administered 2014-05-07 – 2014-05-08 (×2): 1500 mg via INTRAVENOUS
  Filled 2014-05-06 (×2): qty 1500

## 2014-05-06 MED ORDER — MAGNESIUM SULFATE 2 GM/50ML IV SOLN
2.0000 g | Freq: Once | INTRAVENOUS | Status: AC
  Administered 2014-05-06: 2 g via INTRAVENOUS
  Filled 2014-05-06: qty 50

## 2014-05-06 MED ORDER — VANCOMYCIN HCL 10 G IV SOLR
2000.0000 mg | Freq: Once | INTRAVENOUS | Status: AC
  Administered 2014-05-06: 2000 mg via INTRAVENOUS
  Filled 2014-05-06: qty 2000

## 2014-05-06 MED ORDER — MUPIROCIN 2 % EX OINT
1.0000 "application " | TOPICAL_OINTMENT | Freq: Two times a day (BID) | CUTANEOUS | Status: DC
  Administered 2014-05-06 – 2014-05-09 (×8): 1 via NASAL
  Filled 2014-05-06 (×2): qty 22

## 2014-05-06 NOTE — Progress Notes (Signed)
Subjective: Pt feels better compared to yesterday. Denies sick contacts.   Objective: Vital signs in last 24 hours: Filed Vitals:   05/05/14 2057 05/06/14 0500 05/06/14 0902 05/06/14 0925  BP:      Pulse: 101   111  Temp:      TempSrc:      Resp: 20   16  Height:      Weight:  249 lb 4.8 oz (113.082 kg)    SpO2: 94%  98% 95%   Weight change:   Intake/Output Summary (Last 24 hours) at 05/06/14 1003 Last data filed at 05/06/14 0924  Gross per 24 hour  Intake    240 ml  Output    450 ml  Net   -210 ml   General: NAD, sitting upright on bed HEENT: moist mucous membranes, trach collar in place Lungs: bibasilar rales, rt breath sounds decreased compared to left Cardiac: unable to auscultate heart sounds 2/2 body habitus and trach GI: soft, active bowel sounds Neuro: CN 2-12 grossly intact  Lab Results: Basic Metabolic Panel:  Recent Labs Lab 05/05/14 1655 05/06/14 0039  NA 137 138  K 4.3 4.4  CL 96 97  CO2 33* 32  GLUCOSE 305* 236*  BUN 18 23  CREATININE 1.50* 1.76*  CALCIUM 8.4 8.3*  MG  --  1.2*  PHOS  --  5.3*   Liver Function Tests:  Recent Labs Lab 05/05/14 1655  AST 25  ALT 15  ALKPHOS 112  BILITOT 0.6  PROT 6.8  ALBUMIN 3.3*   CBC:  Recent Labs Lab 05/05/14 1705 05/06/14 0039  WBC 11.2* 10.8*  NEUTROABS 8.6*  --   HGB 13.8 13.9  HCT 45.0 44.9  MCV 97.6 99.3  PLT 201 217   CBG:  Recent Labs Lab 05/05/14 1612 05/05/14 1734 05/05/14 2128  GLUCAP 264* 322* 353*   Hemoglobin A1C:  Recent Labs Lab 05/06/14 0039  HGBA1C 8.6*   Urinalysis:  Recent Labs Lab 05/05/14 2324  COLORURINE RED*  LABSPEC 1.016  PHURINE 5.0  GLUCOSEU 100*  HGBUR LARGE*  BILIRUBINUR MODERATE*  KETONESUR 15*  PROTEINUR 100*  UROBILINOGEN 1.0  NITRITE NEGATIVE  LEUKOCYTESUR MODERATE*     Micro Results: Recent Results (from the past 240 hour(s))  MRSA PCR Screening     Status: Abnormal   Collection Time: 05/05/14  9:18 PM  Result Value  Ref Range Status   MRSA by PCR POSITIVE (A) NEGATIVE Final    Comment:        The GeneXpert MRSA Assay (FDA approved for NASAL specimens only), is one component of a comprehensive MRSA colonization surveillance program. It is not intended to diagnose MRSA infection nor to guide or monitor treatment for MRSA infections. RESULT CALLED TO, READ BACK BY AND VERIFIED WITH: MESSER,E RN [id number] 05/06/14 MITCHELL,L   Culture, expectorated sputum-assessment     Status: None   Collection Time: 05/05/14  9:50 PM  Result Value Ref Range Status   Specimen Description SPUTUM  Final   Special Requests NONE  Final   Sputum evaluation   Final    MICROSCOPIC FINDINGS SUGGEST THAT THIS SPECIMEN IS NOT REPRESENTATIVE OF LOWER RESPIRATORY SECRETIONS. PLEASE RECOLLECT. CALLED TO L.MILLIE,RN AT 0126 AM 05/06/13 BY L.PITT    Report Status 05/06/2014 FINAL  Final   Studies/Results: Dg Chest 2 View  05/06/2014   CLINICAL DATA:  Subsequent encounter for respiratory distress  EXAM: CHEST  2 VIEW  COMPARISON:  05/05/2014  FINDINGS: Lungs are hyperexpanded. Tracheostomy  tube remains in place. There is vascular congestion with right-greater-than-left interstitial and patchy alveolar opacity. Likely some chronic interstitial changes of the lung bases. The cardio pericardial silhouette is enlarged. Telemetry leads overlie the chest.  IMPRESSION: No substantial interval change in exam. Cardiomegaly with asymmetric vascular congestion patchy airspace disease.   Electronically Signed   By: Misty Stanley M.D.   On: 05/06/2014 08:22   Dg Chest 2 View  05/05/2014   CLINICAL DATA:  Respiratory distress x2 days.  EXAM: CHEST  2 VIEW  COMPARISON:  CT 03/27/2014.  Chest x-ray 03/27/2014 .  FINDINGS: Tracheostomy tube in good anatomic position. Cardiomegaly with bilateral pulmonary alveolar infiltrates suggesting congestive heart failure an pulmonary edema. Bilateral pneumonia cannot be excluded. No pleural effusion or pneumothorax.   IMPRESSION: 1. Tracheostomy tube in good anatomic position. 2. Findings suggesting congestive heart failure with bilateral pulmonary edema. Bilateral pneumonia cannot be excluded .   Electronically Signed   By: Marcello Moores  Register   On: 05/05/2014 17:47   Medications: I have reviewed the patient's current medications. Scheduled Meds: . albuterol  5 mg Nebulization Q4H  . atorvastatin  20 mg Oral q1800  . Chlorhexidine Gluconate Cloth  6 each Topical Q0600  . dextromethorphan-guaiFENesin  1 tablet Oral BID  . furosemide  60 mg Intravenous BID  . heparin  5,000 Units Subcutaneous 3 times per day  . insulin aspart  0-15 Units Subcutaneous TID WC  . insulin aspart  0-5 Units Subcutaneous QHS  . insulin detemir  20 Units Subcutaneous BID  . mupirocin ointment  1 application Nasal BID  . pantoprazole  40 mg Oral Daily  . piperacillin-tazobactam (ZOSYN)  IV  3.375 g Intravenous 3 times per day  . predniSONE  20 mg Oral Q breakfast  . sodium chloride  3 mL Intravenous Q12H  . sodium chloride  3 mL Intravenous Q12H  . [START ON 05/07/2014] vancomycin  1,500 mg Intravenous Q24H   Continuous Infusions:  PRN Meds:.albuterol, benzonatate, loratadine, zolpidem Assessment/Plan: Principal Problem:   Acute on chronic diastolic congestive heart failure Active Problems:   Sarcoidosis   Diabetes mellitus type II, uncontrolled   OBESITY, MORBID   Essential hypertension   GERD   History of prolonged Q-T interval on ECG   Pulmonary hypertension associated with sarcoidosis   Hyperlipidemia with target LDL less than 100   Tracheostomy status   Aspiration pneumonia   CKD (chronic kidney disease) stage 3, GFR 30-59 ml/min   Long term current use of systemic steroids   Oxygen dependent   Sepsis secondary to CAP: CXR notes suspected bilateral pneumonia and appears to have a RLL infiltrate. CXR this morning w/ no substantial change. She was originally ordered for azithromycin and ceftriaxone but changed to  zosyn for anaerobic coverage. Lactic acid trended down to 1.4 from 2.5 on admission. WBCs down to 10.8 from 11.2 yesterday - EKG ordered, spoke w/ nurse who is aware -sputum culture needs to be recollected -BCx pending -zosyn per pharmacy day 2 -respiratory virus panel, influenza panel, legionella and strep pneumo urine antigen pending -UA w/ moderate LE, neg nitrites, few bacteria, pt currently asymptomatic. Will hold off on tx at this time as pt is clinically improving w/ tx of CAP -tessalon 100 mg po bidprn and mucinex dm 1 tab po bid -speech saw patient, notes slightly incomplete airway closure during swallow w/ trace silent penetration. States pt has low risk of aspiration as long as PMSV is in place.   Asymmetrical calves--LE duplex  ordered, currently being done, will f/u  Acute on chronic diastolic CHF: Last echo [id number] notable for EF 123456, grade 2 diastolic dysfunction. CXR was suggestive for CHF with bilateral pulmonary edema. She takes lasix 40 mg daily at home. BNP elevated at 341.1. CXR this morning still w/ vascular congestion, will continue diuresis.  -continue lasix 60 mg iv bid, Cr 1.76 from 1.50 on admission. BMET tomorrow morning. -daily weight, I&O's  Chronic respiratory failure s/p tracheostomy:  -O2 per nasal cannula w sat 88-91%  Sarcoidosis: Ms. Haft sarcoidosis is followed by Dr. Melvyn Novas. She is on chronic steroids w prednisone alternating 5 and 10 mg daily. No recent flares per the patient.  -prednisone 20 mg daily as stress dose  CKD stage 3:  -cont to monitor w/ IV diuresis  DM2: Last hemoglobin A1c was 8.5% on 02/19/14. Her home regimen is levemir 28 u am and 20 u pm, a sliding scale of novolog with meals, and metformin 1000 mg bid.  -levemir 20 u bid -ssi moderate with hs coverage  HTN: Home antihypertensive regimen is amlodipine 5 mg daily, carvedilol 12.5 mg bid, irbesartan 150 mg daily, and lasix 40 mg daily -cont lasix as above -hold amlodipine 5 mg  daily, carvedilol 12.5 mg bid, irbesartan 150 mg as patient is normotensive and IV diuresis  HLD:  -cont atorvastatin 20 mg daily   GERD: protonix 40 mg daily  Diet: carb modified  DVT PPx: heparin 5000 u [place] tid  Code: full  Dispo: Disposition is deferred at this time, awaiting improvement of current medical problems. Anticipated discharge in approximately 2 day(s).   The patient does have a current PCP Wilber Oliphant, MD) and does need an [facility] hospital follow-up appointment after discharge.  The patient does not know have transportation limitations that hinder transportation to clinic appointments.  .Services Needed at time of discharge: Y = Yes, Blank = No PT:   OT:   RN:   Equipment:   Other:     LOS: 1 day   Julious Oka, MD 05/06/2014, 10:03 AM

## 2014-05-06 NOTE — Procedures (Signed)
Objective Swallowing Evaluation: Modified Barium Swallowing Study  Patient Details  Name: Melody Gray MRN: [id number] Date of Birth: February 12, 1971  Today's Date: 05/06/2014 Time: 1000-1020 SLP Time Calculation (min) (ACUTE ONLY): 20 min  Past Medical History:  Past Medical History  Diagnosis Date  . Sarcoidosis     Followed by Dr. Melvyn Novas; w/ liver involvement per biopsy 12/09, Reversible airway component so started on [facility] 01/2010; HFA 75% p coaching 05/2010  . Hypoxemia     CT angiogram 9/11>> No PE; PFTs 10/11- FEV1 1.20 (49%) with 16% better p B2, DLCO 33%> corrects to 84; O2 sats ok on 4 lpm X rapid walk X 3 laps 05/2010  . Morbid obesity     Target wt= 153 for BMI <30  . QT prolongation   . Hypertension   . Hx of cardiac cath 2/08    No CAD, no RAS,  normal EF  . Seborrheic dermatitis of scalp   . 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 Hep B serology neg. but Hep C ab +, HIV neg. 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 Cardiomyopathy, nonischemic     EF 45% 12/10; Echo 7/11 normal EF, PAS 48  . Diabetic retinopathy     Right eye 2/11  . Health maintenance examination     Mammogram 05/2010 Negative; Last Pap smear 03/2008; Last DM eye exam 2/11> mild non-proliferative diabetic retinopathy. OD  . Helicobacter pylori ab+ 05/2011    Pt was symptomatic and treatment planned for 05/2011  . CHF (congestive heart failure)   . Complication of anesthesia     " difficult waking "  . Sleep apnea   . Diabetes mellitus     insulin dependent   Past Surgical History:  Past Surgical History  Procedure Laterality Date  . Tubal ligation    . Breast surgery    . Cesarean section    . 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 MD reports probable CAP with possible aspiration with sepsis. CXR reads Findings suggesting congestive heart failure with bilateral pulmonary edema. Bilateral pneumonia cannot be excluded .     Assessment / Plan / Recommendation Clinical Impression  Dysphagia Diagnosis: Within Functional Limits Clinical impression: Pt presents with excellent swallow function. When she is wearing her PMSV her swallow is WNL with no aspiration or penetration. SLP also conducted trials without PMSV in place as pt states she sometimes drinks water at night without valve in place. Pt did have slightly incomplete airway closure during the swallow with trace silent penetration. Pt is recommended to continue regular diet and thin liquids with low risk of aspiration as long as PMSV is in place for PO intake.     Treatment Recommendation  No treatment recommended at this time    Diet Recommendation Regular;Thin liquid   Liquid Administration via: Cup;Straw Medication Administration: Whole meds with liquid Supervision: Patient able to self feed Compensations: Slow rate;Small sips/bites Postural Changes and/or Swallow Maneuvers: Seated upright 90 degrees  Other  Recommendations Recommended Consults: MBS Oral Care Recommendations: Patient independent with oral care Other Recommendations: Place PMSV during PO intake   Follow Up Recommendations  None    Frequency and Duration        Pertinent Vitals/Pain NA    SLP Swallow Goals     General HPI: Melody Gray is a 44 year old woman with chronic respiratory failure, pulmonary HTN 2/2 sarcoidosis on chronic prednisone, s/p emergent tracheostomy 07/2013, dCHF, CKD stage 3, DM2, HTN presented to [facility] clinic today with shortness of breath.Pt reports choking episode two weeks  ago. MD reports probable CAP with possible aspiration with sepsis. CXR reads Findings suggesting congestive heart failure with bilateral pulmonary edema. Bilateral pneumonia cannot be excluded . Type of Study: Modified Barium Swallowing Study Reason for Referral: Objectively evaluate swallowing function Previous Swallow Assessment: FEES - Regualr thin with a chin tuck Diet Prior to this Study: Regular;Thin liquids Temperature Spikes Noted: No Respiratory Status: Trach collar Trach Size and Type: #6;Uncuffed;With PMSV in place History of Recent Intubation: No Behavior/Cognition: Alert;Cooperative;Pleasant mood Oral Cavity - Dentition: Adequate natural dentition Oral Motor / Sensory Function: Within functional limits Self-Feeding Abilities: Able to feed self Patient Positioning: Upright in chair Baseline Vocal Quality: Hoarse Volitional Cough: Strong Volitional Swallow: Able to elicit Anatomy: Within functional limits Pharyngeal Secretions: Not observed secondary MBS    Reason for Referral Objectively evaluate swallowing function   Oral Phase Oral Preparation/Oral Phase Oral Phase: WFL   Pharyngeal Phase Pharyngeal Phase Pharyngeal Phase: Impaired Pharyngeal - Thin Pharyngeal - Thin Cup: Penetration/Aspiration during swallow;Reduced airway/laryngeal closure;Within functional limits Penetration/Aspiration details (thin cup): Material enters airway, remains ABOVE vocal cords and not ejected out;Material does not enter airway Pharyngeal - Thin Straw: Within functional limits Pharyngeal - Solids Pharyngeal - Puree: Within functional limits Pharyngeal - Regular: Within functional limits  Cervical Esophageal Phase    GO    Cervical Esophageal Phase Cervical Esophageal Phase: [facility]        [name], MA CCC-SLP [phone]  Tykeisha Peer, Katherene Ponto 05/06/2014, 11:09 AM

## 2014-05-06 NOTE — Evaluation (Signed)
Clinical/Bedside Swallow Evaluation Patient Details  Name: Melody Gray MRN: [id number] Date of Birth: [birth year]  Today's Date: 05/06/2014 Time: 0910-0918 SLP Time Calculation (min) (ACUTE ONLY): 8 min  Past Medical History:  Past Medical History  Diagnosis Date  . Sarcoidosis     Followed by Dr. [name]; w/ liver involvement per biopsy 12/09, Reversible airway component so started on [facility] 01/2010; HFA 75% p coaching 05/2010  . Hypoxemia     CT angiogram 9/11>> No PE; PFTs 10/11- FEV1 1.20 (49%) with 16% better p B2, DLCO 33%> corrects to 84; O2 sats ok on 4 lpm X rapid walk X 3 laps 05/2010  . Morbid obesity     Target wt= 153 for BMI <30  . QT prolongation   . Hypertension   . Hx of cardiac cath 2/08    No CAD, no RAS,  normal EF  . Seborrheic dermatitis of scalp   . 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 Hep B serology neg. but Hep C ab +, HIV neg. 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 Cardiomyopathy, nonischemic     EF 45% 12/10; Echo 7/11 normal EF, PAS 48  . Diabetic retinopathy     Right eye 2/11  . Health maintenance examination     Mammogram 05/2010 Negative; Last Pap smear 03/2008; Last DM eye exam 2/11> mild non-proliferative diabetic retinopathy. OD  . Helicobacter pylori ab+ 05/2011    Pt was symptomatic and treatment planned for 05/2011  . CHF (congestive heart failure)   . Complication of anesthesia     " difficult waking "  . Sleep apnea   . Diabetes mellitus     insulin dependent   Past Surgical History:  Past Surgical History  Procedure Laterality Date  . Tubal ligation    . Breast surgery    . Cesarean section    . Tracheostomy tube placement N/A 08/10/2013    Procedure: TRACHEOSTOMY;  Surgeon: Melody Montane, MD;  Location: [facility] OR;  Service: ENT;  Laterality: N/A;   HPI:  Melody Gray is a 44 year old woman with chronic respiratory failure, pulmonary HTN 2/2 sarcoidosis on chronic  prednisone, s/p emergent tracheostomy 07/2013, dCHF, CKD stage 3, DM2, HTN presented to [facility] clinic today with shortness of breath.Pt reports choking episode two weeks ago. MD reports probable CAP with possible aspiration with sepsis. CXR reads Findings suggesting congestive heart failure with bilateral pulmonary edema. Bilateral pneumonia cannot be excluded .   Assessment / Plan / Recommendation Clinical Impression  Pt does nto demonstrate any evidence of aspiration with am meal. She does report frequent choking episodes and is able to consider several causes (drinking too fast, drinking at night without PMSV on). She has had FEES when trach initially placed that recommended a chin tuck, though she no longer uses this. Given concern for pna and pt report of choking episodes, recommend MBS to determine if use of strategies required to reduce risk of aspiration. Pt in agreement.     Aspiration Risk  Moderate    Diet Recommendation Regular;Thin liquid   Liquid Administration via: Cup;No straw Medication Administration: Whole meds with liquid Supervision: Patient able to self feed Compensations: Slow rate;Small sips/bites Postural Changes and/or Swallow Maneuvers: Seated upright 90 degrees;Out of bed for meals    Other  Recommendations Recommended Consults: MBS Oral Care Recommendations: Oral care BID Other Recommendations: Place PMSV  during PO intake   Follow Up Recommendations  None    Frequency and Duration        Pertinent Vitals/Pain NA    SLP Swallow Goals     Swallow Study Prior Functional Status       General HPI: Melody Gray is a 44 year old woman with chronic respiratory failure, pulmonary HTN 2/2 sarcoidosis on chronic prednisone, s/p emergent tracheostomy 07/2013, dCHF, CKD stage 3, DM2, HTN presented to [facility] clinic today with shortness of breath.Pt reports choking episode two weeks ago. MD reports probable CAP with possible aspiration with sepsis. CXR reads Findings suggesting  congestive heart failure with bilateral pulmonary edema. Bilateral pneumonia cannot be excluded . Type of Study: Bedside swallow evaluation Previous Swallow Assessment: FEES - Regualr thin with a chin tuck Diet Prior to this Study: Regular;Thin liquids Temperature Spikes Noted: No Respiratory Status: Trach collar Trach Size and Type: #6;Uncuffed;With PMSV in place History of Recent Intubation: No Behavior/Cognition: Alert;Cooperative;Pleasant mood Oral Cavity - Dentition: Adequate natural dentition Self-Feeding Abilities: Able to feed self Patient Positioning: Upright in chair Baseline Vocal Quality: Hoarse Volitional Cough: Strong Volitional Swallow: Able to elicit    Oral/Motor/Sensory Function Overall Oral Motor/Sensory Function: Appears within functional limits for tasks assessed   Ice Chips     Thin Liquid Thin Liquid: Within functional limits    Nectar Thick Nectar Thick Liquid: Not tested   Honey Thick Honey Thick Liquid: Not tested   Puree Puree: Within functional limits   Solid   GO    Solid: Within functional limits      [facility], MA CCC-SLP [phone]  Melody Gray, Melody Gray 05/06/2014,9:29 AM

## 2014-05-06 NOTE — Progress Notes (Signed)
*  Preliminary Results* Bilateral lower extremity venous duplex completed. Visualized veins of bilateral lower extremities are negative for deep vein thrombosis. There is no evidence of Baker's cyst bilaterally.  05/06/2014  Maudry Mayhew, RVT, RDCS, RDMS

## 2014-05-06 NOTE — Progress Notes (Signed)
Results for Melody Gray, Melody Gray (MRN [id number]) as of 05/06/2014 12:18  Ref. Range 05/05/2014 16:12 05/05/2014 17:34 05/05/2014 21:28 05/06/2014 10:43 05/06/2014 11:55  Glucose-Capillary Latest Range: 70-99 mg/dL 264 (H) 322 (H) 353 (H) 297 (H) 279 (H)  CBGs remain elevated.  If CBGs continue to be greater than 180 mg/dl, recommend increasing Levemir to 24 units BID.  Patient takes Levemir 48 units total daily at home. (Levemir 28 units every am and 20 units every PM) Will continue to follow while in hospital. Harvel Ricks RN BSN CDE

## 2014-05-06 NOTE — Progress Notes (Signed)
ANTIBIOTIC CONSULT NOTE - INITIAL  Pharmacy Consult for Vancomycin  Indication: pneumonia  Allergies  Allergen Reactions  . Vicodin [Hydrocodone-Acetaminophen] Itching    Patient Measurements: Height: 5\' 1"  (154.9 cm) Weight: 247 lb 6.4 oz (112.22 kg) IBW/kg (Calculated) : 47.8  Vital Signs: Temp: 99.1 F (37.3 C) (01/05 2134) Temp Source: Oral (01/05 2134) BP: 154/86 mmHg (01/05 2134) Pulse Rate: 100 (01/06 0002)  Labs:  Recent Labs  05/05/14 1655 05/05/14 1705  WBC  --  11.2*  HGB  --  13.8  PLT  --  201  CREATININE 1.50*  --    Estimated Creatinine Clearance: 55 mL/min (by C-G formula based on Cr of 1.5).   Microbiology: Recent Results (from the past 720 hour(s))  MRSA PCR Screening     Status: Abnormal   Collection Time: 05/05/14  9:18 PM  Result Value Ref Range Status   MRSA by PCR POSITIVE (A) NEGATIVE Final    Comment:        The GeneXpert MRSA Assay (FDA approved for NASAL specimens only), is one component of a comprehensive MRSA colonization surveillance program. It is not intended to diagnose MRSA infection nor to guide or monitor treatment for MRSA infections. RESULT CALLED TO, READ BACK BY AND VERIFIED WITH: MESSER,E RN [id number] 05/06/14 MITCHELL,L     Medical History: Past Medical History  Diagnosis Date  . Sarcoidosis     Followed by Dr. Melvyn Novas; w/ liver involvement per biopsy 12/09, Reversible airway component so started on [facility] 01/2010; HFA 75% p coaching 05/2010  . Hypoxemia     CT angiogram 9/11>> No PE; PFTs 10/11- FEV1 1.20 (49%) with 16% better p B2, DLCO 33%> corrects to 84; O2 sats ok on 4 lpm X rapid walk X 3 laps 05/2010  . Morbid obesity     Target wt= 153 for BMI <30  . QT prolongation   . Hypertension   . Hx of cardiac cath 2/08    No CAD, no RAS,  normal EF  . Seborrheic dermatitis of scalp   . 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 Hep B serology neg. but Hep C ab +, HIV neg. AMA and Hep C  viral load neg.; Liver biopsy 12/09 c/w liver sarcoid and portal fibrosis  . Cardiomyopathy, nonischemic     EF 45% 12/10; Echo 7/11 normal EF, PAS 48  . Diabetic retinopathy     Right eye 2/11  . Health maintenance examination     Mammogram 05/2010 Negative; Last Pap smear 03/2008; Last DM eye exam 2/11> mild non-proliferative diabetic retinopathy. OD  . Helicobacter pylori ab+ 05/2011    Pt was symptomatic and treatment planned for 05/2011  . CHF (congestive heart failure)   . Complication of anesthesia     " difficult waking "  . Sleep apnea   . Diabetes mellitus     insulin dependent   Assessment: 44 y/o F with shortness of breath, Zosyn already started, adding vancomycin. WBC 11.2, Scr 1.50, other labs as above.   Goal of Therapy:  Vancomycin trough level 15-20 mcg/ml  Plan:  -Vancomycin 2000 mg IV x 1, then 1500 mg IV q24h -Zosyn already ordered -Trend WBC, temp, renal function  -Drug levels as indicated   Narda Bonds 05/06/2014,12:25 AM

## 2014-05-07 ENCOUNTER — Inpatient Hospital Stay (HOSPITAL_COMMUNITY): Payer: Medicare Other

## 2014-05-07 LAB — URINALYSIS, ROUTINE W REFLEX MICROSCOPIC
Bilirubin Urine: NEGATIVE
Glucose, UA: 500 mg/dL — AB
Ketones, ur: NEGATIVE mg/dL
Leukocytes, UA: NEGATIVE
Nitrite: NEGATIVE
Protein, ur: NEGATIVE mg/dL
Specific Gravity, Urine: 1.013 (ref 1.005–1.030)
Urobilinogen, UA: 0.2 mg/dL (ref 0.0–1.0)
pH: 5.5 (ref 5.0–8.0)

## 2014-05-07 LAB — CBC WITH DIFFERENTIAL/PLATELET
Basophils Absolute: 0 10*3/uL (ref 0.0–0.1)
Basophils Relative: 0 % (ref 0–1)
Eosinophils Absolute: 0.1 10*3/uL (ref 0.0–0.7)
Eosinophils Relative: 1 % (ref 0–5)
HCT: 39.2 % (ref 36.0–46.0)
Hemoglobin: 12 g/dL (ref 12.0–15.0)
Lymphocytes Relative: 17 % (ref 12–46)
Lymphs Abs: 1.4 10*3/uL (ref 0.7–4.0)
MCH: 30.7 pg (ref 26.0–34.0)
MCHC: 30.6 g/dL (ref 30.0–36.0)
MCV: 100.3 fL — ABNORMAL HIGH (ref 78.0–100.0)
Monocytes Absolute: 0.6 10*3/uL (ref 0.1–1.0)
Monocytes Relative: 7 % (ref 3–12)
Neutro Abs: 6.1 10*3/uL (ref 1.7–7.7)
Neutrophils Relative %: 75 % (ref 43–77)
Platelets: 159 10*3/uL (ref 150–400)
RBC: 3.91 MIL/uL (ref 3.87–5.11)
RDW: 14.1 % (ref 11.5–15.5)
WBC: 8.1 10*3/uL (ref 4.0–10.5)

## 2014-05-07 LAB — BASIC METABOLIC PANEL
Anion gap: 7 (ref 5–15)
BUN: 30 mg/dL — ABNORMAL HIGH (ref 6–23)
CO2: 33 mmol/L — ABNORMAL HIGH (ref 19–32)
Calcium: 7.8 mg/dL — ABNORMAL LOW (ref 8.4–10.5)
Chloride: 97 mEq/L (ref 96–112)
Creatinine, Ser: 1.8 mg/dL — ABNORMAL HIGH (ref 0.50–1.10)
GFR calc Af Amer: 38 mL/min — ABNORMAL LOW (ref >90)
GFR calc non Af Amer: 33 mL/min — ABNORMAL LOW (ref >90)
Glucose, Bld: 212 mg/dL — ABNORMAL HIGH (ref 70–99)
Potassium: 5.1 mmol/L (ref 3.5–5.1)
Sodium: 137 mmol/L (ref 135–145)

## 2014-05-07 LAB — URINE MICROSCOPIC-ADD ON

## 2014-05-07 LAB — GLUCOSE, CAPILLARY
Glucose-Capillary: 185 mg/dL — ABNORMAL HIGH (ref 70–99)
Glucose-Capillary: 266 mg/dL — ABNORMAL HIGH (ref 70–99)
Glucose-Capillary: 401 mg/dL — ABNORMAL HIGH (ref 70–99)
Glucose-Capillary: 451 mg/dL — ABNORMAL HIGH (ref 70–99)
Glucose-Capillary: 428 mg/dL — ABNORMAL HIGH (ref 70–99)

## 2014-05-07 LAB — PROTEIN / CREATININE RATIO, URINE
Creatinine, Urine: 80.21 mg/dL
Protein Creatinine Ratio: 0.51 — ABNORMAL HIGH (ref 0.00–0.15)
Total Protein, Urine: 41 mg/dL

## 2014-05-07 LAB — STREP PNEUMONIAE URINARY ANTIGEN: Strep Pneumo Urinary Antigen: NEGATIVE

## 2014-05-07 MED ORDER — ALBUTEROL SULFATE (2.5 MG/3ML) 0.083% IN NEBU: 2.5000 mg | INHALATION_SOLUTION | RESPIRATORY_TRACT | Status: DC | PRN

## 2014-05-07 MED ORDER — IRBESARTAN 150 MG PO TABS: 150.0000 mg | ORAL_TABLET | Freq: Every day | ORAL | Status: DC

## 2014-05-07 MED ORDER — ALBUTEROL SULFATE (2.5 MG/3ML) 0.083% IN NEBU
2.5000 mg | INHALATION_SOLUTION | Freq: Four times a day (QID) | RESPIRATORY_TRACT | Status: DC
  Administered 2014-05-07 – 2014-05-09 (×10): 2.5 mg via RESPIRATORY_TRACT
  Filled 2014-05-07 (×9): qty 3

## 2014-05-07 MED ORDER — AMLODIPINE BESYLATE 5 MG PO TABS
5.0000 mg | ORAL_TABLET | Freq: Every day | ORAL | Status: DC
  Administered 2014-05-07 – 2014-05-09 (×3): 5 mg via ORAL
  Filled 2014-05-07 (×3): qty 1

## 2014-05-07 MED ORDER — INSULIN DETEMIR 100 UNIT/ML ~~LOC~~ SOLN
24.0000 [IU] | Freq: Two times a day (BID) | SUBCUTANEOUS | Status: DC
  Administered 2014-05-07 – 2014-05-09 (×5): 24 [IU] via SUBCUTANEOUS
  Filled 2014-05-07 (×6): qty 0.24

## 2014-05-07 MED ORDER — INSULIN ASPART 100 UNIT/ML ~~LOC~~ SOLN
5.0000 [IU] | Freq: Once | SUBCUTANEOUS | Status: AC
  Administered 2014-05-07: 5 [IU] via SUBCUTANEOUS

## 2014-05-07 MED ORDER — INSULIN ASPART 100 UNIT/ML ~~LOC~~ SOLN
0.0000 [IU] | Freq: Every day | SUBCUTANEOUS | Status: DC
  Administered 2014-05-07 – 2014-05-08 (×2): 5 [IU] via SUBCUTANEOUS

## 2014-05-07 MED ORDER — INSULIN ASPART 100 UNIT/ML ~~LOC~~ SOLN
0.0000 [IU] | Freq: Three times a day (TID) | SUBCUTANEOUS | Status: DC
  Administered 2014-05-08: 11 [IU] via SUBCUTANEOUS
  Administered 2014-05-08: 20 [IU] via SUBCUTANEOUS
  Administered 2014-05-08: 3 [IU] via SUBCUTANEOUS
  Administered 2014-05-09: 4 [IU] via SUBCUTANEOUS

## 2014-05-07 MED ORDER — ACETAMINOPHEN 325 MG PO TABS
650.0000 mg | ORAL_TABLET | Freq: Four times a day (QID) | ORAL | Status: DC | PRN
  Administered 2014-05-07 – 2014-05-09 (×4): 650 mg via ORAL
  Filled 2014-05-07 (×4): qty 2

## 2014-05-07 NOTE — Progress Notes (Signed)
I saw and evaluated the patient.  I personally confirmed the key portions of Dr. Jodene Nam history and exam and reviewed pertinent patient test results.  The assessment, diagnosis, and plan were formulated together and I agree with the documentation in the resident's note.  Right posterior bronchial breath sounds consistent with RLL consolidation.  CXR confirms a right lower lobe patchy alveolar infiltrate.  Patient admits to coughing with eating or drinking on occasion which raises the possibility of aspiration and this fits the anatomic location of her consolidation on physical examination.  While an inpatient we will monitor her for signs or symptoms consistent with aspiration and work-up as appropriate.  With history of hypercapnia we will be careful with O2 therapy.  It may be best to shoot for an O2 sat in the upper 80's initially and monitor clinically.  Interestingly, the HCO3 is not elevated which suggests that there has not been the requirement for chronic metabolic alkalemia as compensation for the respiratory acidosis associated with any hypercapnia at baseline.  We appreciate the inpatient admitting team's help in the care of Melody Gray.

## 2014-05-07 NOTE — Care Management Note (Unsigned)
    Page 1 of 1   05/07/2014     5:17:33 PM CARE MANAGEMENT NOTE 05/07/2014  Patient:  Melody Gray, Melody Gray   Account Number:  [id number]  Date Initiated:  05/07/2014  Documentation initiated by:  [facility]  Subjective/Objective Assessment:   44 yo chronic trach patient since April, 2015, admitted from home through the clinic with CAP/sepsis. History of sarcoidosis, CKD st 3/DM2     Action/Plan:   IV abx/IV lasix bid   Anticipated DC Date:  05/09/2014   Anticipated DC Plan:  [place]         Choice offered to / List presented to:             Status of service:   Medicare Important Message given?  YES (If response is "NO", the following Medicare IM given date fields will be blank) Date Medicare IM given:  05/07/2014 Medicare IM given by:  Tomi Bamberger Date Additional Medicare IM given:   Additional Medicare IM given by:    Discharge Disposition:    Per UR Regulation:    If discussed at Long Length of Stay Meetings, dates discussed:    Comments:

## 2014-05-07 NOTE — Progress Notes (Signed)
Pt. CBG is 401.  Paged [phone] to inform MD.  Will continue to monitor and await return call.  Alphonzo Lemmings, RN

## 2014-05-07 NOTE — Trach Care Team (Signed)
Trach Care Progression Note   Patient Details Name: Melody Gray MRN: [id number] DOB: August 26, 1970 Today's Date: 05/07/2014   Tracheostomy Assessment    Tracheostomy Shiley 6 mm Uncuffed (Active)  Status [place] Speaking valve 05/07/2014 12:59 PM  Site Assessment Clean;Dry 05/07/2014 12:46 PM  Site Care Protective barrier to skin 05/07/2014 12:46 PM  [place] Other (Comment) 05/07/2014 12:46 PM  Ties Assessment Secure 05/07/2014 12:59 PM  Cuff pressure (cm) 0 cm 05/07/2014 12:59 PM  Emergency Equipment at bedside Yes 05/07/2014 12:59 PM     Care Needs     Respiratory Therapy Tracheostomy: Chronic trach O2 Device: Tracheostomy Collar FiO2 (%): 60 % SpO2: 97 % (decreased to 50%) Education:  (none needed) Who was educated?: Patient Follow up recommendations:  (will follow prn ) Respiratory barriers to progression:  (pt has chronic tach, no barriers )    Speech Language Pathology  Follow up Recommendations: None   Physical Therapy      Occupational Therapy      Nutritional Patient's Current Diet: Carb modified Diet Recommendations: Regular, Thin liquid    Case Management/Social Work Level of patient care prior to hospitalization: Home-Self care Living status: Alone Insurance payer: Medicare, Medicaid Anticipated discharge disposition:  (still to be determined)    Provider [place] Team/Provider Recommendations [place] Team Members Present-  Ciro Backer, RT, Alvino Blood, SW, Molli Barrows, RD, [name], SLP, Deveron Furlong, [place], NP Consider need for trach change or downsize before discharge. Recommend follow-up at trach clinic if not followed by ENT.            Yalonda Sample, Jaci Carrel (scribe for team) 05/07/2014, 1:48 PM

## 2014-05-07 NOTE — Progress Notes (Signed)
  Date: 05/07/2014  Patient name: Melody Gray  Medical record number: [id number]  Date of birth: 04-05-1971   This patient's plan of care was discussed with the house staff. Please see their note for complete details. I concur with their findings.  Would add that patient had a strikingly abnormal urinalysis on admission and a Cr which is above baseline.  Would recheck UA, if she continues to have menstrual cycles, would investigate if she is currently having her period.  If she is not having her cycle, would further investigate as hematuria.    Sid Falcon, MD 05/07/2014, 2:12 PM

## 2014-05-07 NOTE — Progress Notes (Signed)
Return call back from Dr. Posey Pronto, and informed him that pt. Has not gotten any insulin coverage for the 401 CBG earlier because the new order still didn't go above 400.  We are re-checking her CBG and will paged him back the results.  Will continue to monitor.  Alphonzo Lemmings, RN

## 2014-05-07 NOTE — Progress Notes (Signed)
Still no return call back from MD.  Text paged again.  Will continue to monitor and await return call.  Alphonzo Lemmings, RN

## 2014-05-07 NOTE — Progress Notes (Signed)
Subjective: States feeling better, leg swelling almost back to baseline.   Objective: Vital signs in last 24 hours: Filed Vitals:   05/07/14 0430 05/07/14 0628 05/07/14 0838 05/07/14 0841  BP:      Pulse: 93   86  Temp:      TempSrc:      Resp: 18   20  Height:      Weight:  246 lb 14.6 oz (112 kg)    SpO2: 99%  96% 96%   Weight change: -7.8 oz (-0.22 kg)  Intake/Output Summary (Last 24 hours) at 05/07/14 1143 Last data filed at 05/07/14 [id number]  Gross per 24 hour  Intake    120 ml  Output   1800 ml  Net  -1680 ml   General: NAD, sitting upright on bed HEENT: moist mucous membranes, trach collar in place Lungs: CTAB, good air movement Cardiac: RRR, no murmurs GI: soft, active bowel sounds Neuro: CN 2-12 grossly intact Ext: 1+ pitting edema b/l  Lab Results: Basic Metabolic Panel:  Recent Labs Lab 05/06/14 0039 05/07/14 0500  NA 138 137  K 4.4 5.1  CL 97 97  CO2 32 33*  GLUCOSE 236* 212*  BUN 23 30*  CREATININE 1.76* 1.80*  CALCIUM 8.3* 7.8*  MG 1.2*  --   PHOS 5.3*  --    Liver Function Tests:  Recent Labs Lab 05/05/14 1655  AST 25  ALT 15  ALKPHOS 112  BILITOT 0.6  PROT 6.8  ALBUMIN 3.3*   CBC:  Recent Labs Lab 05/05/14 1705 05/06/14 0039 05/07/14 0708  WBC 11.2* 10.8* 8.1  NEUTROABS 8.6*  --  6.1  HGB 13.8 13.9 12.0  HCT 45.0 44.9 39.2  MCV 97.6 99.3 100.3*  PLT 201 217 159   CBG:  Recent Labs Lab 05/05/14 2128 05/06/14 1043 05/06/14 1155 05/06/14 1711 05/06/14 2216 05/07/14 0804  GLUCAP 353* 297* 279* 247* 382* 185*   Hemoglobin A1C:  Recent Labs Lab 05/06/14 0039  HGBA1C 8.6*   Urinalysis:  Recent Labs Lab 05/05/14 2324  COLORURINE RED*  LABSPEC 1.016  PHURINE 5.0  GLUCOSEU 100*  HGBUR LARGE*  BILIRUBINUR MODERATE*  KETONESUR 15*  PROTEINUR 100*  UROBILINOGEN 1.0  NITRITE NEGATIVE  LEUKOCYTESUR MODERATE*     Micro Results: Recent Results (from the past 240 hour(s))  Culture, Blood     Status:  None (Preliminary result)   Collection Time: 05/05/14  4:55 PM  Result Value Ref Range Status   Specimen Description BLOOD RIGHT ARM  Final   Special Requests BOTTLES DRAWN AEROBIC AND ANAEROBIC [place]  Final   Culture   Final           BLOOD CULTURE RECEIVED NO GROWTH TO DATE CULTURE WILL BE HELD FOR 5 DAYS BEFORE ISSUING A FINAL NEGATIVE REPORT Performed at [facility]    Report Status PENDING  Incomplete  Culture, Blood     Status: None (Preliminary result)   Collection Time: 05/05/14  5:03 PM  Result Value Ref Range Status   Specimen Description BLOOD LEFT ARM  Final   Special Requests BOTTLES DRAWN AEROBIC AND ANAEROBIC [place]  Final   Culture   Final           BLOOD CULTURE RECEIVED NO GROWTH TO DATE CULTURE WILL BE HELD FOR 5 DAYS BEFORE ISSUING A FINAL NEGATIVE REPORT Performed at [facility]    Report Status PENDING  Incomplete  MRSA PCR Screening     Status: Abnormal  Collection Time: 05/05/14  9:18 PM  Result Value Ref Range Status   MRSA by PCR POSITIVE (A) NEGATIVE Final    Comment:        The GeneXpert MRSA Assay (FDA approved for NASAL specimens only), is one component of a comprehensive MRSA colonization surveillance program. It is not intended to diagnose MRSA infection nor to guide or monitor treatment for MRSA infections. RESULT CALLED TO, READ BACK BY AND VERIFIED WITH: MESSER,E RN 0008 05/06/14 MITCHELL,L   Culture, expectorated sputum-assessment     Status: None   Collection Time: 05/05/14  9:50 PM  Result Value Ref Range Status   Specimen Description SPUTUM  Final   Special Requests NONE  Final   Sputum evaluation   Final    MICROSCOPIC FINDINGS SUGGEST THAT THIS SPECIMEN IS NOT REPRESENTATIVE OF LOWER RESPIRATORY SECRETIONS. PLEASE RECOLLECT. CALLED TO L.MILLIE,RN AT 0126 AM 05/06/13 BY L.PITT    Report Status 05/06/2014 FINAL  Final  Culture, expectorated sputum-assessment     Status: None   Collection Time: 05/06/14  3:20 PM  Result  Value Ref Range Status   Specimen Description SPUTUM  Final   Special Requests Immunocompromised  Final   Sputum evaluation   Final    MICROSCOPIC FINDINGS SUGGEST THAT THIS SPECIMEN IS NOT REPRESENTATIVE OF LOWER RESPIRATORY SECRETIONS. PLEASE RECOLLECT. Results Called to: [facility] [id number]    Report Status 05/06/2014 FINAL  Final   Studies/Results: Dg Chest 2 View  05/06/2014   CLINICAL DATA:  Subsequent encounter for respiratory distress  EXAM: CHEST  2 VIEW  COMPARISON:  05/05/2014  FINDINGS: Lungs are hyperexpanded. Tracheostomy tube remains in place. There is vascular congestion with right-greater-than-left interstitial and patchy alveolar opacity. Likely some chronic interstitial changes of the lung bases. The cardio pericardial silhouette is enlarged. Telemetry leads overlie the chest.  IMPRESSION: No substantial interval change in exam. Cardiomegaly with asymmetric vascular congestion patchy airspace disease.   Electronically Signed   By: Misty Stanley M.D.   On: 05/06/2014 08:22   Dg Chest 2 View  05/05/2014   CLINICAL DATA:  Respiratory distress x2 days.  EXAM: CHEST  2 VIEW  COMPARISON:  CT 03/27/2014.  Chest x-ray 03/27/2014 .  FINDINGS: Tracheostomy tube in good anatomic position. Cardiomegaly with bilateral pulmonary alveolar infiltrates suggesting congestive heart failure an pulmonary edema. Bilateral pneumonia cannot be excluded. No pleural effusion or pneumothorax.  IMPRESSION: 1. Tracheostomy tube in good anatomic position. 2. Findings suggesting congestive heart failure with bilateral pulmonary edema. Bilateral pneumonia cannot be excluded .   Electronically Signed   By: Marcello Moores  Register   On: 05/05/2014 17:47   Dg Chest Port 1 View  05/07/2014   CLINICAL DATA:  44 year old female with shortness of Breath, sarcoidosis. Subsequent encounter.  EXAM: PORTABLE CHEST - 1 VIEW  COMPARISON:  05/06/2014 and earlier.  FINDINGS: Portable AP semi upright view at 0554 hrs.  Stable tracheostomy. Stable lung volumes. Stable cardiac size and mediastinal contours. No pneumothorax. Streaky bibasilar opacity appears stable over this series of exams and may be chronic. No areas of worsening ventilation.  IMPRESSION: Stable chest with cardiomegaly and fairly chronic appearing streaky bibasilar opacity.   Electronically Signed   By: Lars Pinks M.D.   On: 05/07/2014 07:42   Medications: I have reviewed the patient's current medications. Scheduled Meds: . albuterol  2.5 mg Nebulization QID  . atorvastatin  20 mg Oral q1800  . Chlorhexidine Gluconate Cloth  6 each Topical Q0600  . dextromethorphan-guaiFENesin  1 tablet Oral BID  . furosemide  60 mg Intravenous BID  . heparin  5,000 Units Subcutaneous 3 times per day  . insulin aspart  0-15 Units Subcutaneous TID WC  . insulin aspart  0-5 Units Subcutaneous QHS  . insulin detemir  24 Units Subcutaneous BID  . mupirocin ointment  1 application Nasal BID  . pantoprazole  40 mg Oral Daily  . piperacillin-tazobactam (ZOSYN)  IV  3.375 g Intravenous 3 times per day  . predniSONE  20 mg Oral Q breakfast  . sodium chloride  3 mL Intravenous Q12H  . sodium chloride  3 mL Intravenous Q12H  . vancomycin  1,500 mg Intravenous Q24H   Continuous Infusions:  PRN Meds:.albuterol, benzonatate, loratadine, zolpidem Assessment/Plan: Principal Problem:   Acute on chronic diastolic congestive heart failure Active Problems:   Sarcoidosis   Diabetes mellitus type II, uncontrolled   OBESITY, MORBID   Essential hypertension   GERD   History of prolonged Q-T interval on ECG   Pulmonary hypertension associated with sarcoidosis   Hyperlipidemia with target LDL less than 100   Tracheostomy status   Aspiration pneumonia   CKD (chronic kidney disease) stage 3, GFR 30-59 ml/min   Long term current use of systemic steroids   Oxygen dependent   Acute on chronic respiratory failure with hypoxia   Pneumonia, organism unspecified   Sepsis  secondary to CAP: resolving, WBC down to 8.1 from 10.8, vital signs improved -sputum culture needs to be recollected -BCx NGTD -zosyn per pharmacy day 3, vanc day 2. Plan to d/c vanc tomorrow and narrow abx coverage as pt continues to improve -respiratory virus panel needs to be collected -influenza panel and strep pneumo urine antigen neg - legionella pending -tessalon 100 mg po bidprn and mucinex dm 1 tab po bid  Acute on chronic diastolic CHF: net -1.8 since admission.  -continue lasix 60 mg iv bid, Cr stable, BMET tomorrow am. . -daily weight, I&O's   Chronic respiratory failure s/p tracheostomy:  -O2 per nasal cannula w sat 88-91%  Asymmetrical calves--LE duplex prelim read neg, will continue to monitor  Sarcoidosis:  -prednisone 20 mg daily as stress dose  CKD stage 3:  -cont to monitor w/ IV diuresis  DM2:hemoglobin A1c was 8.6%. Her home regimen is levemir 28 u am and 20 u pm, a sliding scale of novolog with meals, and metformin 1000 mg bid.  -levemir 20 u bid -ssi moderate with hs coverage  HTN: Home antihypertensive regimen is amlodipine 5 mg daily, carvedilol 12.5 mg bid, irbesartan 150 mg daily, and lasix 40 mg daily -cont lasix as above -hold amlodipine 5 mg daily, carvedilol 12.5 mg bid, irbesartan 150 mg as patient is normotensive and IV diuresis  HLD:  -cont atorvastatin 20 mg daily   GERD: protonix 40 mg daily  Diet: carb modified  DVT PPx: heparin 5000 u  tid  Code: full  Dispo: Disposition is deferred at this time, awaiting improvement of current medical problems. Anticipated discharge in approximately 2 day(s).   The patient does have a current PCP Wilber Oliphant, MD) and does need an [facility] hospital follow-up appointment after discharge.  The patient does not know have transportation limitations that hinder transportation to clinic appointments.  .Services Needed at time of discharge: Y = Yes, Blank = No PT:   OT:   RN:   Equipment:     Other:     LOS: 2 days   Julious Oka, MD 05/07/2014, 11:43 AM

## 2014-05-07 NOTE — Progress Notes (Signed)
Text paged [phone] again to inform that new orders still didn't allow RN to cover pt. CBG of 401.  Will continue to monitor and await for return call.  Alphonzo Lemmings, RN

## 2014-05-07 NOTE — Progress Notes (Signed)
Return call back from Dr. Posey Pronto and inform him that pt. CBG was 451 now.  MD stated he will place new orders.  Will await new orders and carry them out.  Alphonzo Lemmings, RN

## 2014-05-07 NOTE — Progress Notes (Signed)
Paged [phone] to inform MD that pt. CBG was 451 now.  Will continue to monitor and await return call.  Amariyah Bazar Wynetta Emery, Therapist, sports.

## 2014-05-07 NOTE — Plan of Care (Signed)
Problem: Phase I Progression Outcomes Goal: Initial discharge plan identified Outcome: Completed/Met Date Met:  05/07/14 To return home

## 2014-05-08 DIAGNOSIS — Q688 Other specified congenital musculoskeletal deformities: Secondary | ICD-10-CM

## 2014-05-08 DIAGNOSIS — Z7952 Long term (current) use of systemic steroids: Secondary | ICD-10-CM

## 2014-05-08 DIAGNOSIS — Z794 Long term (current) use of insulin: Secondary | ICD-10-CM

## 2014-05-08 DIAGNOSIS — Z87891 Personal history of nicotine dependence: Secondary | ICD-10-CM

## 2014-05-08 DIAGNOSIS — E1122 Type 2 diabetes mellitus with diabetic chronic kidney disease: Secondary | ICD-10-CM

## 2014-05-08 DIAGNOSIS — Z930 Tracheostomy status: Secondary | ICD-10-CM

## 2014-05-08 DIAGNOSIS — J9611 Chronic respiratory failure with hypoxia: Secondary | ICD-10-CM

## 2014-05-08 DIAGNOSIS — I130 Hypertensive heart and chronic kidney disease with heart failure and stage 1 through stage 4 chronic kidney disease, or unspecified chronic kidney disease: Secondary | ICD-10-CM

## 2014-05-08 DIAGNOSIS — J690 Pneumonitis due to inhalation of food and vomit: Secondary | ICD-10-CM

## 2014-05-08 LAB — LEGIONELLA ANTIGEN, URINE

## 2014-05-08 LAB — BASIC METABOLIC PANEL
Anion gap: 7 (ref 5–15)
BUN: 26 mg/dL — ABNORMAL HIGH (ref 6–23)
CO2: 35 mmol/L — ABNORMAL HIGH (ref 19–32)
Calcium: 8.4 mg/dL (ref 8.4–10.5)
Chloride: 96 mEq/L (ref 96–112)
Creatinine, Ser: 1.42 mg/dL — ABNORMAL HIGH (ref 0.50–1.10)
GFR calc Af Amer: 51 mL/min — ABNORMAL LOW (ref >90)
GFR calc non Af Amer: 44 mL/min — ABNORMAL LOW (ref >90)
Glucose, Bld: 152 mg/dL — ABNORMAL HIGH (ref 70–99)
Potassium: 4.5 mmol/L (ref 3.5–5.1)
Sodium: 138 mmol/L (ref 135–145)

## 2014-05-08 LAB — GLUCOSE, CAPILLARY
Glucose-Capillary: 298 mg/dL — ABNORMAL HIGH (ref 70–99)
Glucose-Capillary: 137 mg/dL — ABNORMAL HIGH (ref 70–99)
Glucose-Capillary: 280 mg/dL — ABNORMAL HIGH (ref 70–99)
Glucose-Capillary: 351 mg/dL — ABNORMAL HIGH (ref 70–99)
Glucose-Capillary: 422 mg/dL — ABNORMAL HIGH (ref 70–99)

## 2014-05-08 LAB — HIV ANTIBODY (ROUTINE TESTING W REFLEX)
HIV 1/O/2 Abs-Index Value: <1 (ref <1.00)
HIV-1/HIV-2 Ab: NONREACTIVE

## 2014-05-08 MED ORDER — PREDNISONE 10 MG PO TABS
10.0000 mg | ORAL_TABLET | Freq: Every day | ORAL | Status: DC
  Administered 2014-05-09: 10 mg via ORAL
  Filled 2014-05-08 (×2): qty 1

## 2014-05-08 MED ORDER — INSULIN ASPART 100 UNIT/ML ~~LOC~~ SOLN
5.0000 [IU] | Freq: Once | SUBCUTANEOUS | Status: AC
  Administered 2014-05-08: 5 [IU] via SUBCUTANEOUS

## 2014-05-08 MED ORDER — AMOXICILLIN-POT CLAVULANATE 875-125 MG PO TABS
1.0000 | ORAL_TABLET | Freq: Two times a day (BID) | ORAL | Status: DC
  Administered 2014-05-08 – 2014-05-09 (×3): 1 via ORAL
  Filled 2014-05-08 (×4): qty 1

## 2014-05-08 MED ORDER — FUROSEMIDE 40 MG PO TABS
40.0000 mg | ORAL_TABLET | Freq: Every day | ORAL | Status: DC
  Administered 2014-05-09: 40 mg via ORAL
  Filled 2014-05-08 (×2): qty 1

## 2014-05-08 NOTE — Progress Notes (Signed)
  Date: 05/08/2014  Patient name: Melody Gray  Medical record number: [id number]  Date of birth: Aug 16, 1970   This patient's plan of care was discussed with the house staff. Please see Dr. Caron Presume note for complete details. I concur with her findings.  Patient improved, asking about going home.  Respiratory status improved and will de-escalate Abx today.  Will start home medications back and wean O2 as tolerated.  Renal function is back to baseline.  She can likely go home tomorrow if she continues to do well.   Dr. Beryle Beams will be covering for the weekend.    Sid Falcon, MD 05/08/2014, 2:48 PM

## 2014-05-08 NOTE — Progress Notes (Signed)
Subjective: States breathing is back at baseline. Complains of feeling "woozing" and light headed. Denies n/v and diarrhea.   Objective: Vital signs in last 24 hours: Filed Vitals:   05/07/14 2342 05/08/14 0401 05/08/14 0549 05/08/14 1143  BP:      Pulse: 86 80  91  Temp:      TempSrc:      Resp: 20 20  18   Height:      Weight:   245 lb 11.2 oz (111.449 kg)   SpO2: 96% 94%  94%   Weight change: -1 lb 3.4 oz (-0.551 kg)  Intake/Output Summary (Last 24 hours) at 05/08/14 1246 Last data filed at 05/08/14 1233  Gross per 24 hour  Intake    580 ml  Output   2900 ml  Net  -2320 ml   General: NAD, sitting upright at chair by bed HEENT: moist mucous membranes, trach collar in place Lungs: CTAB, good air movement Cardiac: RRR, no murmurs GI: soft, active bowel sounds Neuro: CN 2-12 grossly intact   Lab Results: Basic Metabolic Panel:  Recent Labs Lab 05/06/14 0039 05/07/14 0500 05/08/14 0542  NA 138 137 138  K 4.4 5.1 4.5  CL 97 97 96  CO2 32 33* 35*  GLUCOSE 236* 212* 152*  BUN 23 30* 26*  CREATININE 1.76* 1.80* 1.42*  CALCIUM 8.3* 7.8* 8.4  MG 1.2*  --   --   PHOS 5.3*  --   --    Liver Function Tests:  Recent Labs Lab 05/05/14 1655  AST 25  ALT 15  ALKPHOS 112  BILITOT 0.6  PROT 6.8  ALBUMIN 3.3*   CBC:  Recent Labs Lab 05/05/14 1705 05/06/14 0039 05/07/14 0708  WBC 11.2* 10.8* 8.1  NEUTROABS 8.6*  --  6.1  HGB 13.8 13.9 12.0  HCT 45.0 44.9 39.2  MCV 97.6 99.3 100.3*  PLT 201 217 159   CBG:  Recent Labs Lab 05/07/14 1647 05/07/14 1846 05/07/14 2211 05/08/14 0147 05/08/14 0739 05/08/14 1157  GLUCAP 401* 451* 428* 298* 137* 280*   Hemoglobin A1C:  Recent Labs Lab 05/06/14 0039  HGBA1C 8.6*   Urinalysis:  Recent Labs Lab 05/05/14 2324 05/07/14 1750  COLORURINE RED* YELLOW  LABSPEC 1.016 1.013  PHURINE 5.0 5.5  GLUCOSEU 100* 500*  HGBUR LARGE* MODERATE*  BILIRUBINUR MODERATE* NEGATIVE  KETONESUR 15* NEGATIVE    PROTEINUR 100* NEGATIVE  UROBILINOGEN 1.0 0.2  NITRITE NEGATIVE NEGATIVE  LEUKOCYTESUR MODERATE* NEGATIVE     Micro Results: Recent Results (from the past 240 hour(s))  Culture, Blood     Status: None (Preliminary result)   Collection Time: 05/05/14  4:55 PM  Result Value Ref Range Status   Specimen Description BLOOD RIGHT ARM  Final   Special Requests BOTTLES DRAWN AEROBIC AND ANAEROBIC [place]  Final   Culture   Final           BLOOD CULTURE RECEIVED NO GROWTH TO DATE CULTURE WILL BE HELD FOR 5 DAYS BEFORE ISSUING A FINAL NEGATIVE REPORT Performed at [facility]    Report Status PENDING  Incomplete  Culture, Blood     Status: None (Preliminary result)   Collection Time: 05/05/14  5:03 PM  Result Value Ref Range Status   Specimen Description BLOOD LEFT ARM  Final   Special Requests BOTTLES DRAWN AEROBIC AND ANAEROBIC [place]  Final   Culture   Final           BLOOD CULTURE RECEIVED NO GROWTH TO  DATE CULTURE WILL BE HELD FOR 5 DAYS BEFORE ISSUING A FINAL NEGATIVE REPORT Performed at [facility]    Report Status PENDING  Incomplete  MRSA PCR Screening     Status: Abnormal   Collection Time: 05/05/14  9:18 PM  Result Value Ref Range Status   MRSA by PCR POSITIVE (A) NEGATIVE Final    Comment:        The GeneXpert MRSA Assay (FDA approved for NASAL specimens only), is one component of a comprehensive MRSA colonization surveillance program. It is not intended to diagnose MRSA infection nor to guide or monitor treatment for MRSA infections. RESULT CALLED TO, READ BACK BY AND VERIFIED WITH: MESSER,E RN 0008 05/06/14 MITCHELL,L   Culture, expectorated sputum-assessment     Status: None   Collection Time: 05/05/14  9:50 PM  Result Value Ref Range Status   Specimen Description SPUTUM  Final   Special Requests NONE  Final   Sputum evaluation   Final    MICROSCOPIC FINDINGS SUGGEST THAT THIS SPECIMEN IS NOT REPRESENTATIVE OF LOWER RESPIRATORY SECRETIONS. PLEASE  RECOLLECT. CALLED TO L.MILLIE,RN AT 0126 AM 05/06/13 BY L.PITT    Report Status 05/06/2014 FINAL  Final  Culture, expectorated sputum-assessment     Status: None   Collection Time: 05/06/14  3:20 PM  Result Value Ref Range Status   Specimen Description SPUTUM  Final   Special Requests Immunocompromised  Final   Sputum evaluation   Final    MICROSCOPIC FINDINGS SUGGEST THAT THIS SPECIMEN IS NOT REPRESENTATIVE OF LOWER RESPIRATORY SECRETIONS. PLEASE RECOLLECT. Results Called to: [facility] [id number]    Report Status 05/06/2014 FINAL  Final   Studies/Results: Dg Chest Port 1 View  05/07/2014   CLINICAL DATA:  44 year old female with shortness of Breath, sarcoidosis. Subsequent encounter.  EXAM: PORTABLE CHEST - 1 VIEW  COMPARISON:  05/06/2014 and earlier.  FINDINGS: Portable AP semi upright view at 0554 hrs. Stable tracheostomy. Stable lung volumes. Stable cardiac size and mediastinal contours. No pneumothorax. Streaky bibasilar opacity appears stable over this series of exams and may be chronic. No areas of worsening ventilation.  IMPRESSION: Stable chest with cardiomegaly and fairly chronic appearing streaky bibasilar opacity.   Electronically Signed   By: Lars Pinks M.D.   On: 05/07/2014 07:42   Medications: I have reviewed the patient's current medications. Scheduled Meds: . albuterol  2.5 mg Nebulization QID  . amLODipine  5 mg Oral Daily  . amoxicillin-clavulanate  1 tablet Oral Q12H  . atorvastatin  20 mg Oral q1800  . Chlorhexidine Gluconate Cloth  6 each Topical Q0600  . dextromethorphan-guaiFENesin  1 tablet Oral BID  . furosemide  40 mg Oral Daily  . heparin  5,000 Units Subcutaneous 3 times per day  . insulin aspart  0-20 Units Subcutaneous TID WC  . insulin aspart  0-5 Units Subcutaneous QHS  . insulin detemir  24 Units Subcutaneous BID  . mupirocin ointment  1 application Nasal BID  . pantoprazole  40 mg Oral Daily  . predniSONE  20 mg Oral Q breakfast  .  sodium chloride  3 mL Intravenous Q12H  . sodium chloride  3 mL Intravenous Q12H   Continuous Infusions:  PRN Meds:.acetaminophen, albuterol, benzonatate, loratadine, zolpidem Assessment/Plan: Principal Problem:   Acute on chronic diastolic congestive heart failure Active Problems:   Sarcoidosis   Diabetes mellitus type II, uncontrolled   OBESITY, MORBID   Essential hypertension   GERD   History of prolonged Q-T interval on  ECG   Pulmonary hypertension associated with sarcoidosis   Hyperlipidemia with target LDL less than 100   Tracheostomy status   Aspiration pneumonia   CKD (chronic kidney disease) stage 3, GFR 30-59 ml/min   Long term current use of systemic steroids   Oxygen dependent   Acute on chronic respiratory failure with hypoxia   Pneumonia, organism unspecified   Aspiration PNA: resolving, could also be pneumonitis.  -sputum culture needs to be recollected, same w/ respiratory viral panel -BCx NGTD -zosyn and vanc d/c'd today. Started on augmentin BID x 7 days total - legionella neg, strept neg, flu neg -tessalon 100 mg po bidprn and mucinex dm 1 tab po bid  Acute on chronic diastolic CHF: net -4.11L since admission, aggressive diuresis likely cause of pt's light headedness - transitioned back to home lasix 40mg  po dose   Chronic respiratory failure s/p tracheostomy: pt's satting in the high 90's overnight, hypoxia unlikely the reason for pt's lightheadedness this morning - spoke w/ respiratory therapist, per trach settings pt is on 8L. Will wean down to 5L this afternoon. Pt usually has O2 sats around 92 at home.   Asymmetrical calves--LE duplex prelim read neg, will continue to monitor  Sarcoidosis:  -prednisone 20 mg given today, will start pred 10mg  tomorrow and then alternate w/ 5mg  per home schedule  CKD stage 3:  -Cr 1.42, will cont to monitor w/ BMETs  DM2:hemoglobin A1c was 8.6%. Her home regimen is levemir 28 u am and 20 u pm, a sliding scale of  novolog with meals, and metformin 1000 mg bid.  -levemir 20 u bid -ssi resistant   HTN: Home antihypertensive regimen is amlodipine 5 mg daily, carvedilol 12.5 mg bid, irbesartan 150 mg daily, and lasix 40 mg daily -started norvasc 5mg  yesterday. Pt feeling woozy this morning will hold off on restarting additional meds at this time  HLD:  -cont atorvastatin 20 mg daily   GERD: protonix 40 mg daily  Diet: carb modified  DVT PPx: heparin 5000 u [place] tid  Code: full  Dispo: Disposition is deferred at this time, awaiting improvement of current medical problems. Anticipated discharge in approximately 1-2 day(s).   The patient does have a current PCP Wilber Oliphant, MD) and does need an [facility] hospital follow-up appointment after discharge.  The patient does not know have transportation limitations that hinder transportation to clinic appointments.  .Services Needed at time of discharge: Y = Yes, Blank = No PT:   OT:   RN:   Equipment:   Other:     LOS: 3 days   Julious Oka, MD 05/08/2014, 12:46 PM

## 2014-05-08 NOTE — Progress Notes (Signed)
Inpatient Diabetes Program Recommendations  AACE/ADA: New Consensus Statement on Inpatient Glycemic Control (2013)  Target Ranges:  Prepandial:   less than 140 mg/dL      Peak postprandial:   less than 180 mg/dL (1-2 hours)      Critically ill patients:  140 - 180 mg/dL   Reason for Assessment:  Results for CHENEQUA, CHIAVERINI (MRN [id number]) as of 05/08/2014 12:34  Ref. Range 05/07/2014 16:47 05/07/2014 18:46 05/07/2014 22:11 05/08/2014 01:47 05/08/2014 07:39  Glucose-Capillary Latest Range: 70-99 mg/dL 401 (H) 451 (H) 428 (H) 298 (H) 137 (H)    Diabetes history: Type 2 diabetes Outpatient Diabetes medications: Levemir 28 units in the AM and 20 units q PM Current orders for Inpatient glycemic control:  Novolog resistant tid with meals and HS, Levemir 24 units bid  MD please consider adding Novolog meal coverage 6 units tid with meals (hold if patient eats less than 50%).  Hopefully this will help prevent the rise in CBG's after meals.    Thanks, Adah Perl, RN, BC-ADM Inpatient Diabetes Coordinator Pager [phone]

## 2014-05-09 LAB — GLUCOSE, CAPILLARY
Glucose-Capillary: 154 mg/dL — ABNORMAL HIGH (ref 70–99)
Glucose-Capillary: 262 mg/dL — ABNORMAL HIGH (ref 70–99)
Glucose-Capillary: 362 mg/dL — ABNORMAL HIGH (ref 70–99)

## 2014-05-09 LAB — BASIC METABOLIC PANEL
Anion gap: 8 (ref 5–15)
BUN: 20 mg/dL (ref 6–23)
CO2: 36 mmol/L — ABNORMAL HIGH (ref 19–32)
Calcium: 8.7 mg/dL (ref 8.4–10.5)
Chloride: 92 mEq/L — ABNORMAL LOW (ref 96–112)
Creatinine, Ser: 1.08 mg/dL (ref 0.50–1.10)
GFR calc Af Amer: 71 mL/min — ABNORMAL LOW (ref >90)
GFR calc non Af Amer: 61 mL/min — ABNORMAL LOW (ref >90)
Glucose, Bld: 213 mg/dL — ABNORMAL HIGH (ref 70–99)
Potassium: 4.3 mmol/L (ref 3.5–5.1)
Sodium: 136 mmol/L (ref 135–145)

## 2014-05-09 MED ORDER — IRBESARTAN 150 MG PO TABS: 150.0000 mg | ORAL_TABLET | Freq: Every day | ORAL | Status: DC

## 2014-05-09 MED ORDER — INSULIN ASPART 100 UNIT/ML ~~LOC~~ SOLN
5.0000 [IU] | Freq: Once | SUBCUTANEOUS | Status: AC
  Administered 2014-05-09: 5 [IU] via SUBCUTANEOUS

## 2014-05-09 MED ORDER — AMOXICILLIN-POT CLAVULANATE 875-125 MG PO TABS: 1.0000 | ORAL_TABLET | Freq: Two times a day (BID) | ORAL | Status: DC

## 2014-05-09 NOTE — Plan of Care (Signed)
Problem: Phase III Progression Outcomes Goal: Foley discontinued Outcome: Not Applicable Date Met:  35/52/17 Pt voids

## 2014-05-09 NOTE — Progress Notes (Signed)
Subjective: Pt states she feels much better today compared to yesterday.  No longer having headache. Breathing is back to baseline. States she is ready to go home. Lives at home w/ her children.  Objective: Vital signs in last 24 hours: Filed Vitals:   05/08/14 2150 05/08/14 2323 05/09/14 0415 05/09/14 0500  BP:      Pulse: 101 99 100   Temp:      TempSrc:      Resp: 22 22 20    Height:      Weight:    244 lb 4.3 oz (110.8 kg)  SpO2: 97% 96% 97%    Weight change: -1 lb 6.9 oz (-0.649 kg)  Intake/Output Summary (Last 24 hours) at 05/09/14 0829 Last data filed at 05/09/14 0509  Gross per 24 hour  Intake   1740 ml  Output   4100 ml  Net  -2360 ml   General: NAD HEENT: moist mucous membranes, trach collar in place Lungs: RUL w/ rhonchi, good air movement Cardiac: RRR, no murmurs GI: soft, active bowel sounds Neuro: CN 2-12 grossly intact   Lab Results: Basic Metabolic Panel:  Recent Labs Lab 05/06/14 0039  05/08/14 0542 05/09/14 0459  NA 138  < > 138 136  K 4.4  < > 4.5 4.3  CL 97  < > 96 92*  CO2 32  < > 35* 36*  GLUCOSE 236*  < > 152* 213*  BUN 23  < > 26* 20  CREATININE 1.76*  < > 1.42* 1.08  CALCIUM 8.3*  < > 8.4 8.7  MG 1.2*  --   --   --   PHOS 5.3*  --   --   --   < > = values in this interval not displayed. Liver Function Tests:  Recent Labs Lab 05/05/14 1655  AST 25  ALT 15  ALKPHOS 112  BILITOT 0.6  PROT 6.8  ALBUMIN 3.3*   CBC:  Recent Labs Lab 05/05/14 1705 05/06/14 0039 05/07/14 0708  WBC 11.2* 10.8* 8.1  NEUTROABS 8.6*  --  6.1  HGB 13.8 13.9 12.0  HCT 45.0 44.9 39.2  MCV 97.6 99.3 100.3*  PLT 201 217 159   CBG:  Recent Labs Lab 05/08/14 0147 05/08/14 0739 05/08/14 1157 05/08/14 1717 05/08/14 2149 05/09/14 0018  GLUCAP 298* 137* 280* 351* 422* 362*   Hemoglobin A1C:  Recent Labs Lab 05/06/14 0039  HGBA1C 8.6*   Urinalysis:  Recent Labs Lab 05/05/14 2324 05/07/14 1750  COLORURINE RED* YELLOW  LABSPEC  1.016 1.013  PHURINE 5.0 5.5  GLUCOSEU 100* 500*  HGBUR LARGE* MODERATE*  BILIRUBINUR MODERATE* NEGATIVE  KETONESUR 15* NEGATIVE  PROTEINUR 100* NEGATIVE  UROBILINOGEN 1.0 0.2  NITRITE NEGATIVE NEGATIVE  LEUKOCYTESUR MODERATE* NEGATIVE     Micro Results: Recent Results (from the past 240 hour(s))  Culture, Blood     Status: None (Preliminary result)   Collection Time: 05/05/14  4:55 PM  Result Value Ref Range Status   Specimen Description BLOOD RIGHT ARM  Final   Special Requests BOTTLES DRAWN AEROBIC AND ANAEROBIC [place]  Final   Culture   Final           BLOOD CULTURE RECEIVED NO GROWTH TO DATE CULTURE WILL BE HELD FOR 5 DAYS BEFORE ISSUING A FINAL NEGATIVE REPORT Performed at [facility]    Report Status PENDING  Incomplete  Culture, Blood     Status: None (Preliminary result)   Collection Time: 05/05/14  5:03 PM  Result Value Ref Range Status   Specimen Description BLOOD LEFT ARM  Final   Special Requests BOTTLES DRAWN AEROBIC AND ANAEROBIC [place]  Final   Culture   Final           BLOOD CULTURE RECEIVED NO GROWTH TO DATE CULTURE WILL BE HELD FOR 5 DAYS BEFORE ISSUING A FINAL NEGATIVE REPORT Performed at [facility]    Report Status PENDING  Incomplete  MRSA PCR Screening     Status: Abnormal   Collection Time: 05/05/14  9:18 PM  Result Value Ref Range Status   MRSA by PCR POSITIVE (A) NEGATIVE Final    Comment:        The GeneXpert MRSA Assay (FDA approved for NASAL specimens only), is one component of a comprehensive MRSA colonization surveillance program. It is not intended to diagnose MRSA infection nor to guide or monitor treatment for MRSA infections. RESULT CALLED TO, READ BACK BY AND VERIFIED WITH: MESSER,E RN 0008 05/06/14 MITCHELL,L   Culture, expectorated sputum-assessment     Status: None   Collection Time: 05/05/14  9:50 PM  Result Value Ref Range Status   Specimen Description SPUTUM  Final   Special Requests NONE  Final   Sputum  evaluation   Final    MICROSCOPIC FINDINGS SUGGEST THAT THIS SPECIMEN IS NOT REPRESENTATIVE OF LOWER RESPIRATORY SECRETIONS. PLEASE RECOLLECT. CALLED TO L.MILLIE,RN AT 0126 AM 05/06/13 BY L.PITT    Report Status 05/06/2014 FINAL  Final  Culture, expectorated sputum-assessment     Status: None   Collection Time: 05/06/14  3:20 PM  Result Value Ref Range Status   Specimen Description SPUTUM  Final   Special Requests Immunocompromised  Final   Sputum evaluation   Final    MICROSCOPIC FINDINGS SUGGEST THAT THIS SPECIMEN IS NOT REPRESENTATIVE OF LOWER RESPIRATORY SECRETIONS. PLEASE RECOLLECT. Results Called to: [facility] [id number]    Report Status 05/06/2014 FINAL  Final   Studies/Results: No results found. Medications: I have reviewed the patient's current medications. Scheduled Meds: . albuterol  2.5 mg Nebulization QID  . amLODipine  5 mg Oral Daily  . amoxicillin-clavulanate  1 tablet Oral Q12H  . atorvastatin  20 mg Oral q1800  . Chlorhexidine Gluconate Cloth  6 each Topical Q0600  . dextromethorphan-guaiFENesin  1 tablet Oral BID  . furosemide  40 mg Oral Daily  . heparin  5,000 Units Subcutaneous 3 times per day  . insulin aspart  0-20 Units Subcutaneous TID WC  . insulin aspart  0-5 Units Subcutaneous QHS  . insulin detemir  24 Units Subcutaneous BID  . mupirocin ointment  1 application Nasal BID  . pantoprazole  40 mg Oral Daily  . predniSONE  10 mg Oral Q breakfast  . sodium chloride  3 mL Intravenous Q12H  . sodium chloride  3 mL Intravenous Q12H   Continuous Infusions:  PRN Meds:.acetaminophen, albuterol, benzonatate, loratadine, zolpidem Assessment/Plan: Principal Problem:   Acute on chronic diastolic congestive heart failure Active Problems:   Sarcoidosis   Diabetes mellitus type II, uncontrolled   OBESITY, MORBID   Essential hypertension   GERD   History of prolonged Q-T interval on ECG   Pulmonary hypertension associated with sarcoidosis    Hyperlipidemia with target LDL less than 100   Tracheostomy status   Aspiration pneumonia   CKD (chronic kidney disease) stage 3, GFR 30-59 ml/min   Long term current use of systemic steroids   Oxygen dependent   Acute on chronic respiratory failure  with hypoxia   Pneumonia, organism unspecified   Aspiration PNA:  -BCx NGTD - continue augmentin BID x 7 days total abx tx, end date 05/11/14 ( received 3 days of vanc, and 4 days of zosyn) -tessalon 100 mg po bidprn and mucinex dm 1 tab po bid  Acute on chronic diastolic CHF: net -5.13L since admission - cont home lasix 40mg  po dose   Chronic respiratory failure s/p tracheostomy: - improved, satting at 93% on 7L O2 w/ 28% FiO2.  Asymmetrical calves--LE duplex final read neg  Sarcoidosis:  -continue home pred schedule which is 10mg  alternating w/ 5mg  every other day  CKD stage 3:  -Cr 1.08 today improved from admission Cr of 1.50  DM2: hemoglobin A1c was 8.6%. Her home regimen is levemir 28 u am and 20 u pm, a sliding scale of novolog with meals, and metformin 1000 mg bid.  -levemir 20 u bid -ssi resistant   HTN: Home antihypertensive regimen is amlodipine 5 mg daily, carvedilol 12.5 mg bid, irbesartan 150 mg daily, and lasix 40 mg daily -continue home norvasc 5mg , can resume all antiHTN meds on discharge  HLD:  -cont atorvastatin 20 mg daily   GERD: protonix 40 mg daily  Diet: carb modified  DVT PPx: heparin 5000 u [place] tid  Code: full  Dispo: Discharge today  The patient does have a current PCP Wilber Oliphant, MD) and does need an [facility] hospital follow-up appointment after discharge.  The patient does not know have transportation limitations that hinder transportation to clinic appointments.  .Services Needed at time of discharge: Y = Yes, Blank = No PT:   OT:   RN:   Equipment:   Other:     LOS: 4 days   Julious Oka, MD 05/09/2014, 8:29 AM

## 2014-05-09 NOTE — Progress Notes (Signed)
Melody [name] to be [name] per MD order.  Discussed with the patient and all questions fully answered.    Medication List    TAKE these medications        albuterol 108 (90 BASE) MCG/ACT inhaler  Commonly known as:  PROVENTIL HFA;VENTOLIN HFA  Inhale 2 puffs into the lungs every 6 (six) hours as needed for wheezing or shortness of breath.     amLODipine 5 MG tablet  Commonly known as:  NORVASC  Take 1 tablet (5 mg total) by mouth daily.     amoxicillin-clavulanate 875-125 MG per tablet  Commonly known as:  AUGMENTIN  Take 1 tablet by mouth 2 (two) times daily.     atorvastatin 20 MG tablet  Commonly known as:  LIPITOR  Take 1 tablet (20 mg total) by mouth daily at 6 PM.     carvedilol 12.5 MG tablet  Commonly known as:  COREG  Take 1 tablet (12.5 mg total) by mouth 2 (two) times daily.     ferrous sulfate 325 (65 FE) MG tablet  Take 1 tablet (325 mg total) by mouth 3 (three) times daily with meals.     furosemide 40 MG tablet  Commonly known as:  LASIX  Take 40 mg by mouth daily.     insulin aspart 100 UNIT/ML injection  Commonly known as:  novoLOG  - Take novolog per sliding scale:  - CBG 70-120: 0 units   - CBG 121-150: 1 unit   - CBG 151-200: 2 units   - CBG 201-250: 3 units   - CBG 251-300: 5 units   - CBG 301-350: 7 units   - CBG 351-400: 9 units   - CBG > 400: call MD     insulin detemir 100 UNIT/ML injection  Commonly known as:  LEVEMIR  28 units every morning and 20 units every night. diag code 250.00     irbesartan 150 MG tablet  Commonly known as:  AVAPRO  Take 1 tablet by mouth daily.     Melatonin 3 MG Tabs  Take 1 tablet (3 mg total) by mouth at bedtime as needed.     metFORMIN 1000 MG tablet  Commonly known as:  GLUCOPHAGE  Take 1 tablet by mouth 2 (two) times daily.     MUCINEX DM MAXIMUM STRENGTH 60-1200 MG Tb12  One every 12 hours as needed for cough     omeprazole 20 MG capsule  Commonly known as:  PRILOSEC  Take 1  capsule (20 mg total) by mouth daily.     predniSONE 10 MG tablet  Commonly known as:  DELTASONE  Take 5-10 mg by mouth daily with breakfast. Alternate between 5mg  and 10mg      predniSONE 10 MG tablet  Commonly known as:  DELTASONE  take 1 tablet by mouth once daily WITH BREAKFAST        VVS, Skin clean, dry and intact without evidence of skin break down, no evidence of skin tears noted. IV catheter discontinued intact. Site without signs and symptoms of complications. Dressing and pressure applied.  An After Visit Summary was printed and given to the patient.  [name]/c education completed with patient/family including follow up instructions, medication list, [name]/c activities limitations if indicated, with other [name]/c instructions as indicated by MD - patient able to verbalize understanding, all questions fully answered.   Patient instructed to return to ED, call 911, or call MD for any changes in condition.   Patient escorted via  WC, and [name]/C home via private auto.  Melody [name] 05/09/2014 10:37 AM

## 2014-05-09 NOTE — Discharge Summary (Signed)
Name: Melody [name] MRN: [id number] DOB: 1970/09/02 44 y.o. PCP: Wilber Oliphant, MD  Date of Admission: 05/05/2014  5:37 PM Date of Discharge: 05/09/2014 Attending Physician: Sid Falcon, MD  Discharge Diagnosis: Principal Problem:   Acute on chronic diastolic congestive heart failure Active Problems:   Sarcoidosis   Diabetes mellitus type II, uncontrolled   OBESITY, MORBID   Essential hypertension   GERD   History of prolonged Q-T interval on ECG   Pulmonary hypertension associated with sarcoidosis   Hyperlipidemia with target LDL less than 100   Tracheostomy status   Aspiration pneumonia   CKD (chronic kidney disease) stage 3, GFR 30-59 ml/min   Long term current use of systemic steroids   Oxygen dependent   Acute on chronic respiratory failure with hypoxia   Pneumonia, organism unspecified  Discharge Medications:   Medication List    TAKE these medications        albuterol 108 (90 BASE) MCG/ACT inhaler  Commonly known as:  PROVENTIL HFA;VENTOLIN HFA  Inhale 2 puffs into the lungs every 6 (six) hours as needed for wheezing or shortness of breath.     amLODipine 5 MG tablet  Commonly known as:  NORVASC  Take 1 tablet (5 mg total) by mouth daily.     amoxicillin-clavulanate 875-125 MG per tablet  Commonly known as:  AUGMENTIN  Take 1 tablet by mouth 2 (two) times daily.     atorvastatin 20 MG tablet  Commonly known as:  LIPITOR  Take 1 tablet (20 mg total) by mouth daily at 6 PM.     carvedilol 12.5 MG tablet  Commonly known as:  COREG  Take 1 tablet (12.5 mg total) by mouth 2 (two) times daily.     ferrous sulfate 325 (65 FE) MG tablet  Take 1 tablet (325 mg total) by mouth 3 (three) times daily with meals.     furosemide 40 MG tablet  Commonly known as:  LASIX  Take 40 mg by mouth daily.     insulin aspart 100 UNIT/ML injection  Commonly known as:  novoLOG  - Take novolog per sliding scale:  - CBG 70-120: 0 units   - CBG 121-150: 1 unit   -  CBG 151-200: 2 units   - CBG 201-250: 3 units   - CBG 251-300: 5 units   - CBG 301-350: 7 units   - CBG 351-400: 9 units   - CBG > 400: call MD     insulin detemir 100 UNIT/ML injection  Commonly known as:  LEVEMIR  28 units every morning and 20 units every night. diag code 250.00     irbesartan 150 MG tablet  Commonly known as:  AVAPRO  Take 1 tablet (150 mg total) by mouth daily.     Melatonin 3 MG Tabs  Take 1 tablet (3 mg total) by mouth at bedtime as needed.     metFORMIN 1000 MG tablet  Commonly known as:  GLUCOPHAGE  Take 1 tablet by mouth 2 (two) times daily.     MUCINEX DM MAXIMUM STRENGTH 60-1200 MG Tb12  One every 12 hours as needed for cough     omeprazole 20 MG capsule  Commonly known as:  PRILOSEC  Take 1 capsule (20 mg total) by mouth daily.     predniSONE 10 MG tablet  Commonly known as:  DELTASONE  Take 5-10 mg by mouth daily with breakfast. Alternate between 5mg  and 10mg      predniSONE 10 MG  tablet  Commonly known as:  DELTASONE  take 1 tablet by mouth once daily WITH BREAKFAST        Disposition and follow-up:   [name] was discharged from [facility] in Stable condition.  At the hospital follow up visit please address:  1.  Please ensure pt completed course of augmentin BID, last dose should be on 05/11/14  2.  Labs / imaging needed at time of follow-up: BMET   3.  Pending labs/ test needing follow-up: none  Follow-up Appointments:     Follow-up Information    Follow up with [facility] On 06/04/2014.   Specialty:  Respiratory Therapy   Why:  1 pm    Contact information:   [address] [id number] [place] [phone]      Follow up with Jerene Pitch, MD.   Specialty:  Internal Medicine   Contact information:   [place] [address] [phone]       Procedures Performed:  Dg Chest 2 View  05/06/2014    CLINICAL DATA:  Subsequent encounter for respiratory distress  EXAM: CHEST  2 VIEW  COMPARISON:  05/05/2014  FINDINGS: Lungs are hyperexpanded. Tracheostomy tube remains in place. There is vascular congestion with right-greater-than-left interstitial and patchy alveolar opacity. Likely some chronic interstitial changes of the lung bases. The cardio pericardial silhouette is enlarged. Telemetry leads overlie the chest.  IMPRESSION: No substantial interval change in exam. Cardiomegaly with asymmetric vascular congestion patchy airspace disease.   Electronically Signed   By: Misty Stanley M.D.   On: 05/06/2014 08:22   Dg Chest 2 View  05/05/2014   CLINICAL DATA:  Respiratory distress x2 days.  EXAM: CHEST  2 VIEW  COMPARISON:  CT 03/27/2014.  Chest x-ray 03/27/2014 .  FINDINGS: Tracheostomy tube in good anatomic position. Cardiomegaly with bilateral pulmonary alveolar infiltrates suggesting congestive heart failure an pulmonary edema. Bilateral pneumonia cannot be excluded. No pleural effusion or pneumothorax.  IMPRESSION: 1. Tracheostomy tube in good anatomic position. 2. Findings suggesting congestive heart failure with bilateral pulmonary edema. Bilateral pneumonia cannot be excluded .   Electronically Signed   By: Marcello Moores  Register   On: 05/05/2014 17:47   Dg Chest Port 1 View  05/07/2014   CLINICAL DATA:  44 year old female with shortness of Breath, sarcoidosis. Subsequent encounter.  EXAM: PORTABLE CHEST - 1 VIEW  COMPARISON:  05/06/2014 and earlier.  FINDINGS: Portable AP semi upright view at 0554 hrs. Stable tracheostomy. Stable lung volumes. Stable cardiac size and mediastinal contours. No pneumothorax. Streaky bibasilar opacity appears stable over this series of exams and may be chronic. No areas of worsening ventilation.  IMPRESSION: Stable chest with cardiomegaly and fairly chronic appearing streaky bibasilar opacity.   Electronically Signed   By: Lars Pinks M.D.   On: 05/07/2014 07:42    Admission  HPI: [name] is a 44 year old woman with chronic respiratory failure, pulmonary HTN 2/2 sarcoidosis on chronic prednisone, s/p emergent tracheostomy 07/2013, dCHF, CKD stage 3, DM2, HTN presented to [facility] clinic today with shortness of breath. She notes that for the past two months she has had lower extremity edema. She was seen in the ED in 03/2014 and sent home. Her LE edema was stable until about two weeks ago when it began increasing. She thinks she has gained about 15 lbs over the past two months. She also developed a cough a few days ago that is productive  of a white sputum. She thinks she choked while eating about two weeks ago. Over the past day she has felt short of breath. She has had to use her albuterol inhaler more recently. She also noted increase from her baseline orthopnea and congestion. Her home blood sugars have been elevated the past few days. She denies any fevers, chills, night sweats, sick contacts, chest pain, palpitations, abdominal pain, nausea, emesis, diarrhea, constipation, headache, vision change, numbness, paresthesia, weakness.  She came into the [facility] clinic today for these symptoms and was noted to have an oxygen saturation of 59% on room air. This increased to 70% on 2L and then the high 80s, low 90s on 4L. She had a stat CXR that Drs. Heber Bantam and Marvin reviewed to find a RLL infiltrate and decided to directly admit her to the hospital.  Hospital Course by problem list: Principal Problem:   Acute on chronic diastolic congestive heart failure Active Problems:   Sarcoidosis   Diabetes mellitus type II, uncontrolled   OBESITY, MORBID   Essential hypertension   GERD   History of prolonged Q-T interval on ECG   Pulmonary hypertension associated with sarcoidosis   Hyperlipidemia with target LDL less than 100   Tracheostomy status   Aspiration pneumonia   CKD (chronic kidney disease) stage 3, GFR 30-59 ml/min   Long term current use of systemic steroids   Oxygen dependent    Acute on chronic respiratory failure with hypoxia   Pneumonia, organism unspecified   CAP:  CXR notes suspected bilateral pneumonia and appears to have a RLL infiltrate which would be consistent with an aspiration. Could also be an acute pneumonitis. She does note that she choked about two weeks ago eating so she requires anaerobic coverage. She was originally ordered for azithromycin and ceftriaxone but this was changed to zosyn for anaerobic coverage. She was then transitioned to augmentin BID for a total of 7 days. Influenza panel negative. Legionella and strep pneumo urine antigen negative. UA x1 w/ numerous RBCs likely from pt menstruating. Repeat UA positive for 7-10 RBCs and neg for LE and nitrites. Speech pathology saw patient and notes low risk of aspiration as long as PMSV is in place.   #Acute on chronic diastolic CHF: Last echo [id number] notable for EF 123456, grade 2 diastolic dysfunction. CXR was suggestive for CHF with bilateral pulmonary edema. This is consistent with LE swelling noted on exam.  While her leg swelling is asymmetric (L>R), she had negative doppler for DVT 12/17/13 and negative CTA 03/27/14 for PE. Repeat LE dopplers were negative this admission. Given lasix 60 mg IV bid w/ -4.11 total output, She was then transitioned to home lasix 40mg  po.   #Chronic respiratory failure s/p tracheostomy: [name] underwent emergent tracheostomy in 07/2013 due to hypercarbic respiratory failure by ENT after anesthesia was unable to intubate her. On admission her ABG here was notable for a chronic respiratory acidosis with pH 7.26, pCO2 66, pO2 61, bicarb 28. Albuterol 5 mg neb q4h and q2hprn. Breathing gradually improved and pt was stable for d/c home.  #Sarcoidosis: [name] sarcoidosis is followed by Dr. Melvyn Novas. She is on chronic steroids w prednisone alternating 5 and 10 mg daily. No recent flares per the patient. Given prednisone 20 mg daily as stress dose during admission then transitioned to  home regimen day of discharge.   Discharge Vitals:   BP 141/80 mmHg  Pulse 100  Temp(Src) 99.1 F (37.3 C) (Oral)  Resp 20  Ht 5'  1" (1.549 m)  Wt 244 lb 4.3 oz (110.8 kg)  BMI 46.18 kg/m2  SpO2 97%  LMP 04/29/2014  Discharge Labs:  Results for orders placed or performed during the hospital encounter of 05/05/14 (from the past 24 hour(s))  Glucose, capillary     Status: Abnormal   Collection Time: 05/08/14 11:57 AM  Result Value Ref Range   Glucose-Capillary 280 (H) 70 - 99 mg/dL  Glucose, capillary     Status: Abnormal   Collection Time: 05/08/14  5:17 PM  Result Value Ref Range   Glucose-Capillary 351 (H) 70 - 99 mg/dL  Glucose, capillary     Status: Abnormal   Collection Time: 05/08/14  9:49 PM  Result Value Ref Range   Glucose-Capillary 422 (H) 70 - 99 mg/dL  Glucose, capillary     Status: Abnormal   Collection Time: 05/09/14 12:18 AM  Result Value Ref Range   Glucose-Capillary 362 (H) 70 - 99 mg/dL  Basic metabolic panel     Status: Abnormal   Collection Time: 05/09/14  4:59 AM  Result Value Ref Range   Sodium 136 135 - 145 mmol/L   Potassium 4.3 3.5 - 5.1 mmol/L   Chloride 92 (L) 96 - 112 mEq/L   CO2 36 (H) 19 - 32 mmol/L   Glucose, Bld 213 (H) 70 - 99 mg/dL   BUN 20 6 - 23 mg/dL   Creatinine, Ser 1.08 0.50 - 1.10 mg/dL   Calcium 8.7 8.4 - 10.5 mg/dL   GFR calc non Af Amer 61 (L) >90 mL/min   GFR calc Af Amer 71 (L) >90 mL/min   Anion gap 8 5 - 15  Glucose, capillary     Status: Abnormal   Collection Time: 05/09/14  7:59 AM  Result Value Ref Range   Glucose-Capillary 154 (H) 70 - 99 mg/dL    Signed: Julious Oka, MD 05/09/2014, 8:43 AM    Services Ordered on Discharge: none Equipment Ordered on Discharge: none

## 2014-05-09 NOTE — Discharge Instructions (Signed)
Take augmentin once tonight, then twice a day tomorrow for 2 more days. You last dose of antibiotics will be on 05/11/14.  Our clinic will call you to make you a hospital follow up appointment.

## 2014-05-09 NOTE — Progress Notes (Addendum)
Pt had HS blood sugar 422 mg/dl. Pt was  asymptomatic. Pt was given 5 unit of novoolg per sliding scale and sch 24 units of insulin levemir given. On-call provider notified . Order received to recheck bs 2 hours after coverage given . Rechecked bs is 362 mg/dl. On-call provider made aware and order received. Will cont to monitor.

## 2014-05-12 LAB — CULTURE, BLOOD (SINGLE)
Culture: NO GROWTH
Culture: NO GROWTH

## 2014-05-16 ENCOUNTER — Other Ambulatory Visit: Payer: Self-pay | Admitting: Internal Medicine

## 2014-05-17 ENCOUNTER — Other Ambulatory Visit: Payer: Self-pay | Admitting: Internal Medicine

## 2014-05-21 ENCOUNTER — Ambulatory Visit (INDEPENDENT_AMBULATORY_CARE_PROVIDER_SITE_OTHER): Payer: Medicare Other | Admitting: Internal Medicine

## 2014-05-21 ENCOUNTER — Encounter: Payer: Self-pay | Admitting: Internal Medicine

## 2014-05-21 VITALS — BP 97/63 | HR 90 | Temp 98.0°F | Ht 61.0 in | Wt 243.5 lb

## 2014-05-21 DIAGNOSIS — J690 Pneumonitis due to inhalation of food and vomit: Secondary | ICD-10-CM | POA: Diagnosis not present

## 2014-05-21 DIAGNOSIS — E785 Hyperlipidemia, unspecified: Secondary | ICD-10-CM | POA: Diagnosis not present

## 2014-05-21 DIAGNOSIS — E119 Type 2 diabetes mellitus without complications: Secondary | ICD-10-CM | POA: Diagnosis not present

## 2014-05-21 DIAGNOSIS — I5032 Chronic diastolic (congestive) heart failure: Secondary | ICD-10-CM

## 2014-05-21 DIAGNOSIS — J9612 Chronic respiratory failure with hypercapnia: Secondary | ICD-10-CM | POA: Diagnosis not present

## 2014-05-21 LAB — GLUCOSE, CAPILLARY: Glucose-Capillary: 229 mg/dL — ABNORMAL HIGH (ref 70–99)

## 2014-05-21 MED ORDER — ATORVASTATIN CALCIUM 20 MG PO TABS: 20.0000 mg | ORAL_TABLET | Freq: Every day | ORAL | Status: DC

## 2014-05-21 MED ORDER — FERROUS SULFATE 325 (65 FE) MG PO TABS: 325.0000 mg | ORAL_TABLET | Freq: Three times a day (TID) | ORAL | Status: DC

## 2014-05-21 NOTE — Assessment & Plan Note (Signed)
-  Completed course of Augmentin.

## 2014-05-21 NOTE — Patient Instructions (Signed)
General Instructions:   Please bring your medicines with you each time you come to clinic.  Medicines may include prescription medications, over-the-counter medications, herbal remedies, eye drops, vitamins, or other pills.   Progress Toward Treatment Goals:  Treatment Goal 02/19/2014  Hemoglobin A1C deteriorated  Blood pressure at goal  Stop smoking -    Self Care Goals & Plans:  Self Care Goal 05/21/2014  Manage my medications take my medicines as prescribed; bring my medications to every visit; refill my medications on time  Monitor my health keep track of my blood glucose  Eat healthy foods drink diet soda or water instead of juice or soda; eat more vegetables; eat foods that are low in salt; eat baked foods instead of fried foods; eat fruit for snacks and desserts  Be physically active -  Stop smoking -    Home Blood Glucose Monitoring 09/09/2013  Check my blood sugar 3 times a day  When to check my blood sugar before meals     Care Management & Community Referrals:  Referral 09/03/2012  Referrals made to community resources exercise/physical therapy

## 2014-05-21 NOTE — Progress Notes (Signed)
[facility] Subjective:   Patient ID: Melody Gray female   DOB: 1970/08/13 44 y.o.   MRN: [id number]  HPI: Melody Gray is a 44 y.o. female with a PMH below who presents for HFU. She arrived late to the appointment but we have squeezed her into the schedule especially since we have an impending snow storm and clinic is to be closed tomorrow. She was admitted from clinic with concern for acute on chronic respiratory failure due to CAP versus acute on chronic CHF exacerbation.  She was discharged on a course of Augmentin to complete 7 days (she has completed course), she was diuresed ~4L and resumed on her home medications.  For her sarcoidosis she was given stress dose steroids. She is here today with her two children. She reports she is doing very well.  She has her chronic cough but otherwise has no complaints.  No fever chills, weight is down 4 lbs since last visit.  Taking all of her medications as prescribed. Needs refills for atorvastatin and iron.    Past Medical History  Diagnosis Date  . Sarcoidosis     Followed by Dr. Melvyn Novas; w/ liver involvement per biopsy 12/09, Reversible airway component so started on [facility] 01/2010; HFA 75% p coaching 05/2010  . Hypoxemia     CT angiogram 9/11>> No PE; PFTs 10/11- FEV1 1.20 (49%) with 16% better p B2, DLCO 33%> corrects to 84; O2 sats ok on 4 lpm X rapid walk X 3 laps 05/2010  . Morbid obesity     Target wt= 153 for BMI <30  . QT prolongation   . Hypertension   . Hx of cardiac cath 2/08    No CAD, no RAS,  normal EF  . Seborrheic dermatitis of scalp   . 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 Hep B serology neg. but Hep C ab +, HIV neg. 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 Cardiomyopathy, nonischemic     EF 45% 12/10; Echo 7/11 normal EF, PAS 48  . Diabetic retinopathy     Right eye 2/11  . Health maintenance examination    Mammogram 05/2010 Negative; Last Pap smear 03/2008; Last DM eye exam 2/11> mild non-proliferative diabetic retinopathy. OD  . Helicobacter pylori ab+ 05/2011    Pt was symptomatic and treatment planned for 05/2011  . CHF (congestive heart failure)   . Complication of anesthesia     " difficult waking "  . Sleep apnea   . Diabetes mellitus     insulin dependent   Current Outpatient Prescriptions  Medication Sig Dispense Refill  . albuterol (PROVENTIL HFA;VENTOLIN HFA) 108 (90 BASE) MCG/ACT inhaler Inhale 2 puffs into the lungs every 6 (six) hours as needed for wheezing or shortness of breath. 1 Inhaler 2  . amLODipine (NORVASC) 5 MG tablet take 1 tablet by mouth once daily 30 tablet 5  . amoxicillin-clavulanate (AUGMENTIN) 875-125 MG per tablet Take 1 tablet by mouth 2 (two) times daily. 5 tablet 0  . atorvastatin (LIPITOR) 20 MG tablet Take 1 tablet (20 mg total) by mouth daily at 6 PM. 90 tablet 3  . B-D INS SYRINGE 0.5CC/30GX1/2" 30G X 1/2" 0.5 ML MISC USE AS DIRECTED 100 each 1  . carvedilol (COREG) 12.5 MG tablet Take 1 tablet (12.5 mg total) by mouth 2 (two) times daily. 180 tablet 3  .  Dextromethorphan-Guaifenesin (MUCINEX DM MAXIMUM STRENGTH) 60-1200 MG TB12 One every 12 hours as needed for cough    . ferrous sulfate 325 (65 FE) MG tablet Take 1 tablet (325 mg total) by mouth 3 (three) times daily with meals. 90 tablet 3  . furosemide (LASIX) 40 MG tablet Take 40 mg by mouth daily.    . insulin aspart (NOVOLOG) 100 UNIT/ML injection Take novolog per sliding scale: CBG 70-120: 0 units  CBG 121-150: 1 unit  CBG 151-200: 2 units  CBG 201-250: 3 units  CBG 251-300: 5 units  CBG 301-350: 7 units  CBG 351-400: 9 units  CBG > 400: call MD 30 mL 3  . insulin detemir (LEVEMIR) 100 UNIT/ML injection 28 units every morning and 20 units every night. diag code 250.00    . irbesartan (AVAPRO) 150 MG tablet Take 1 tablet (150 mg total) by mouth daily. 30 tablet 3  . Melatonin 3 MG TABS Take 1  tablet (3 mg total) by mouth at bedtime as needed. (Patient taking differently: Take 3 mg by mouth at bedtime as needed (sleep). )  0  . metFORMIN (GLUCOPHAGE) 1000 MG tablet Take 1 tablet by mouth 2 (two) times daily.    Marland Kitchen omeprazole (PRILOSEC) 20 MG capsule Take 1 capsule (20 mg total) by mouth daily. 30 capsule 3  . predniSONE (DELTASONE) 10 MG tablet take 1 tablet by mouth once daily WITH BREAKFAST (Patient not taking: Reported on 05/05/2014) 30 tablet 2  . predniSONE (DELTASONE) 10 MG tablet Take 5-10 mg by mouth daily with breakfast. Alternate between 5mg  and 10mg      No current facility-administered medications for this visit.   Family History  Problem Relation Age of Onset  . Cancer Mother     colon cancer  . Multiple sclerosis Father   . Asthma Sister     in childhood  . Diabetes Father   . Hypertension     History   Social History  . Marital Status: Single    Spouse Name: N/A    Number of Children: 2  . Years of Education: N/A   Occupational History  . 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 Smoking status: Former Smoker -- 0.30 packs/day for 20 years    Quit date: 06/29/2013  . Smokeless tobacco: Never Used     Comment: Needs to cut back.  . Alcohol Use: No  . Drug Use: No  . Sexual Activity: None   Other Topics Concern  . None   Social History Narrative   Diabetic card given 05/03/2010.   Financial assistance approved for 100% discount at [facility] and has [facility] card. Melody Gray 12/07/2009.      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   Review of Systems: Review of Systems  Constitutional: Negative for fever, chills and malaise/fatigue.  Eyes: Negative for blurred vision.  Respiratory: Positive for cough. Negative for sputum production, shortness of breath and wheezing.   Cardiovascular: Positive for leg swelling (improved). Negative for chest pain.  Gastrointestinal: Negative for heartburn and abdominal pain.   Genitourinary: Negative for dysuria.  Musculoskeletal: Negative for myalgias.  Skin: Negative for rash.  Neurological: Negative for dizziness and headaches.  Psychiatric/Behavioral: Negative for depression.     Objective:  Physical Exam: Filed Vitals:   05/21/14 1018  BP: 97/63  Pulse: 90  Temp: 98 F (36.7 C)  TempSrc: Oral  Height: 5\' 1"  (1.549  m)  Weight: 243 lb 8 oz (110.451 kg)  SpO2: 100%   Physical Exam  Constitutional: She is well-developed, well-nourished, and in no distress.  HENT:  Mouth/Throat: Oropharynx is clear and moist.  passy -muir valve  Cardiovascular: Normal rate and regular rhythm.   Pulmonary/Chest: Effort normal and breath sounds normal. No respiratory distress. She has no wheezes. She has no rales.  Abdominal: Soft. Bowel sounds are normal.  Musculoskeletal: She exhibits edema (trace to 1+ bilateral).  Nursing note and vitals reviewed.   Assessment & Plan:  Case discussed with Dr. Ellwood Dense  Chronic diastolic heart failure, grade 2 Wt Readings from Last 5 Encounters:  05/21/14 243 lb 8 oz (110.451 kg)  05/09/14 244 lb 4.3 oz (110.8 kg)  05/09/14 244 lb 4.3 oz (110.8 kg)  05/05/14 247 lb (112.038 kg)  02/19/14 245 lb 12.8 oz (111.494 kg)    Weight down, currently 100% on 2L via nasal cannula (home O2). -Continue current medications.   Chronic respiratory failure with hypercapnia/ noct trach dep -Patient is doing well today, 100% on 2L right now at rest. - F/U with Dr. Melvyn Novas.   Aspiration pneumonia -Completed course of Augmentin.     Medications Ordered Meds ordered this encounter  Medications  . DISCONTD: atorvastatin (LIPITOR) 20 MG tablet    Sig: Take 1 tablet (20 mg total) by mouth daily at 6 PM.    Dispense:  90 tablet    Refill:  3  . DISCONTD: ferrous sulfate 325 (65 FE) MG tablet    Sig: Take 1 tablet (325 mg total) by mouth 3 (three) times daily with meals.    Dispense:  90 tablet    Refill:  3  . atorvastatin  (LIPITOR) 20 MG tablet    Sig: Take 1 tablet (20 mg total) by mouth daily at 6 PM.    Dispense:  90 tablet    Refill:  3  . ferrous sulfate 325 (65 FE) MG tablet    Sig: Take 1 tablet (325 mg total) by mouth 3 (three) times daily with meals.    Dispense:  90 tablet    Refill:  3   Other Orders Orders Placed This Encounter  Procedures  . Glucose, capillary

## 2014-05-21 NOTE — Assessment & Plan Note (Signed)
-   Refilled Atorvastatin 20mg , last LDL was above goal. -Consider recheck of Lipid panel at next visit.

## 2014-05-21 NOTE — Progress Notes (Signed)
Internal Medicine Clinic Attending  Case discussed with Dr. Hoffman at the time of the visit.  We reviewed the resident's history and exam and pertinent patient test results.  I agree with the assessment, diagnosis, and plan of care documented in the resident's note.  

## 2014-05-21 NOTE — Assessment & Plan Note (Addendum)
-  Patient is doing well today, 100% on 2L right now at rest. - F/U with Dr. Melvyn Novas.

## 2014-05-21 NOTE — Assessment & Plan Note (Signed)
Wt Readings from Last 5 Encounters:  05/21/14 243 lb 8 oz (110.451 kg)  05/09/14 244 lb 4.3 oz (110.8 kg)  05/09/14 244 lb 4.3 oz (110.8 kg)  05/05/14 247 lb (112.038 kg)  02/19/14 245 lb 12.8 oz (111.494 kg)    Weight down, currently 100% on 2L via nasal cannula (home O2). -Continue current medications.

## 2014-06-04 ENCOUNTER — Ambulatory Visit (HOSPITAL_COMMUNITY)
Admit: 2014-06-04 | Discharge: 2014-06-04 | Disposition: A | Discharge status: Home / Self Care | Payer: Medicare Other | Source: Ambulatory Visit | Attending: Internal Medicine | Admitting: Internal Medicine

## 2014-06-04 ENCOUNTER — Other Ambulatory Visit: Payer: Self-pay | Admitting: Acute Care

## 2014-06-04 DIAGNOSIS — G4733 Obstructive sleep apnea (adult) (pediatric): Secondary | ICD-10-CM | POA: Diagnosis not present

## 2014-06-04 DIAGNOSIS — J9610 Chronic respiratory failure, unspecified whether with hypoxia or hypercapnia: Secondary | ICD-10-CM | POA: Insufficient documentation

## 2014-06-04 DIAGNOSIS — I272 Other secondary pulmonary hypertension: Secondary | ICD-10-CM | POA: Diagnosis not present

## 2014-06-04 DIAGNOSIS — Z87891 Personal history of nicotine dependence: Secondary | ICD-10-CM | POA: Diagnosis not present

## 2014-06-04 DIAGNOSIS — I509 Heart failure, unspecified: Secondary | ICD-10-CM | POA: Insufficient documentation

## 2014-06-04 DIAGNOSIS — D869 Sarcoidosis, unspecified: Secondary | ICD-10-CM | POA: Diagnosis not present

## 2014-06-04 DIAGNOSIS — Z430 Encounter for attention to tracheostomy: Secondary | ICD-10-CM | POA: Insufficient documentation

## 2014-06-04 DIAGNOSIS — Z930 Tracheostomy status: Secondary | ICD-10-CM

## 2014-06-04 NOTE — Progress Notes (Signed)
Tracheostomy Procedure Note  Melody Gray [id number] Jul 13, 1970  Pre Procedure Tracheostomy Information  Trach Brand: Shiley Size: 6 Style: Uncuffed Secured by: Velcro  Vitals BP-148/81, HR-94, RR-18, SpO2-97% on 2L [place]   Procedure: trach change    Post Procedure Tracheostomy Information  Trach Brand: Shiley Size: 6 Style: Uncuffed Secured by: Velcro   Post Procedure Evaluation:  ETCO2 positive color change from yellow to purple : Yes.   Vital signs:blood pressure 135/94, pulse 90, respirations 18 and pulse oximetry 97% on 2L [place] Patients current condition: stable Complications: No apparent complications Trach site exam: clean, dry Wound care done: 4 x 4 gauze Patient did tolerate procedure well.   Education: None  Additional needs: Obturator, inner cannula, & trach cap sent home with pt  New PMV ordered through [facility]

## 2014-06-04 NOTE — Progress Notes (Signed)
    CC: Trach change  Brief History   This is a 44 year old female f/b Dr Melvyn Novas for chronic respiratory failure in the setting of OHS/OSA, Sarcoidosis and secondary PAH. Has a long standing h/o tobacco abuse and medical non-adherence. She was emergently trached on 07/2013 due to hypercarbic respiratory failure by ENT after anesthesia was unable to intubate her. Patient did well until July when she had another episode and trach was changed from cuffless to cuffed. Prior to discharge was again changed to a cuffless 6 shiley. Patient coming to see [name] in the trach clinic for routine trach change. Last seen for routine change 10/13  Review of Systems:   Bolds are positive  Constitutional: weight loss, gain, night sweats, Fevers, chills, fatigue .  HEENT: headaches, Sore throat, sneezing, nasal congestion, post nasal drip, Difficulty swallowing, Tooth/dental problems, visual complaints visual changes, ear ache CV:  chest pain, radiates: ,Orthopnea, PND, swelling in lower extremities, dizziness, palpitations, syncope.  GI  heartburn, indigestion, abdominal pain, nausea, vomiting, diarrhea, change in bowel habits, loss of appetite, bloody stools.  Resp: cough, productive: , hemoptysis, dyspnea, chest pain, pleuritic.  Skin: rash or itching or icterus GU: dysuria, change in color of urine, urgency or frequency. flank pain, hematuria  MS: joint pain or swelling. decreased range of motion  Psych: change in mood or affect. depression or anxiety.  Neuro: difficulty with speech, weakness, numbness, ataxia   LMP 04/29/2014  General appearance:  44 year old  Mouth:  membranes and no mucosal ulcerations; normal hard and soft palate Neck: Trachea midline, # 6 trach, uncuffed trach stoma unremarkable .  Lungs: CTA, with normal respiratory effort and no intercostal retractions CV: RRR, no MRGs  Abdomen: Soft, non-tender; no masses or HSM Extremities: No peripheral edema or extremity lymphadenopathy Skin:  Normal temperature, turgor and texture; no rash, ulcers or subcutaneous nodules Psych: Appropriate affect, alert and oriented to person, place and time Date  Seen by:  Melody Gray size  Trach change pmv  Capping  Stoma site DME Diet  Goals   7/29 yacoub 6 cuffless no        10/13 Melody Gray 6 shiley cufless  yes yes n/a Unremarkable   Regular  Continue day time trach w/ PMV  No decannulation.   06/04/14 Melody Gray 6 shiley cuffless  yes yes no Looks good   Regular  cont same plan of care. Will decide on Portex change next time.                            Impression  44 year old with morbid obesity, OHS/OSA, sarcoid, secondary PAH and heart failure. Trached emergently for hypercarbic respiratory failure. Not a candidate for decannulation   Plan:  - cont 6 cuffless  - ROV 3 months.   Melody Gray ACNP-BC [place] Pager # [phone] OR # [phone] if no answer

## 2014-06-16 ENCOUNTER — Other Ambulatory Visit: Payer: Self-pay | Admitting: Internal Medicine

## 2014-06-24 NOTE — Progress Notes (Signed)
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 I initially saw in July of 2012 for evaluation of abnormal echocardiogram. Echocardiogram in June of 2012 revealed an ejection fraction of 35% which was new. There was mild biatrial enlargement, mild right ventricular enlargement and mildly reduced RV function. Cardiac MRI in July of 2012 revealed moderate to severe LV systolic dysfunction, EF [id number]. The anterior and anterolateral walls appeared severely hypokinetic. Mild RV dilation with mild systolic dysfunction. Possible small areas of mid wall delayed enhancement in the apical septal and apical lateral walls. These areas were very faint and not definitely of clinical significance. Mid wall enhancement can be found in infiltrative cardiomyopathies such as that due to sarcoidosis. TSH and ferritin normal. Cardiac catheterization repeated in September of 2012 and showed an ejection fraction of 40%. There was no coronary disease. Echo repeated in June 2015 and showed normal LV function, grade 2 diastolic dysfunction, mild LAE, mild RAE; mild RVE with reduced function. Lower ext dopplers 8/15 showed no DVT. Since I last saw her She has some dyspnea on exertion but stable. No chest pain. Occasional minimal pedal edema. No palpitations or syncope.  Current Outpatient Prescriptions  Medication Sig Dispense Refill  . albuterol (PROVENTIL HFA;VENTOLIN HFA) 108 (90 BASE) MCG/ACT inhaler Inhale 2 puffs into the lungs every 6 (six) hours as needed for wheezing or shortness of breath. 1 Inhaler 2  . amLODipine (NORVASC) 5 MG tablet take 1 tablet by mouth once daily 30 tablet 5  . atorvastatin (LIPITOR) 20 MG tablet Take 1 tablet (20 mg total) by mouth daily at 6 PM. 90 tablet 3  . B-D INS SYRINGE 0.5CC/30GX1/2" 30G X 1/2" 0.5 ML MISC USE AS DIRECTED 100 each 1  . carvedilol (COREG) 12.5 MG tablet Take 1 tablet (12.5 mg total) by mouth 2 (two) times daily. 180 tablet 3  .  Dextromethorphan-Guaifenesin (MUCINEX DM MAXIMUM STRENGTH) 60-1200 MG TB12 One every 12 hours as needed for cough    . ferrous sulfate 325 (65 FE) MG tablet Take 1 tablet (325 mg total) by mouth 3 (three) times daily with meals. 90 tablet 3  . furosemide (LASIX) 40 MG tablet Take 40 mg by mouth daily.    . insulin aspart (NOVOLOG) 100 UNIT/ML injection Take novolog per sliding scale: CBG 70-120: 0 units  CBG 121-150: 1 unit  CBG 151-200: 2 units  CBG 201-250: 3 units  CBG 251-300: 5 units  CBG 301-350: 7 units  CBG 351-400: 9 units  CBG > 400: call MD 30 mL 3  . insulin detemir (LEVEMIR) 100 UNIT/ML injection 28 units every morning and 20 units every night. diag code 250.00    . irbesartan (AVAPRO) 150 MG tablet Take 1 tablet (150 mg total) by mouth daily. 30 tablet 3  . Melatonin 3 MG TABS Take 1 tablet (3 mg total) by mouth at bedtime as needed. (Patient taking differently: Take 3 mg by mouth at bedtime as needed (sleep). )  0  . metFORMIN (GLUCOPHAGE) 1000 MG tablet take 1 tablet by mouth twice a day with meals 180 tablet 4  . omeprazole (PRILOSEC) 20 MG capsule Take 1 capsule (20 mg total) by mouth daily. 30 capsule 3  . predniSONE (DELTASONE) 10 MG tablet Take 5-10 mg by mouth daily with breakfast. Alternate between 5mg  and 10mg      No current facility-administered medications for this visit.     Past Medical History  Diagnosis Date  . Sarcoidosis  Followed by Dr. Melvyn Novas; w/ liver involvement per biopsy 12/09, Reversible airway component so started on [facility] 01/2010; HFA 75% p coaching 05/2010  . Hypoxemia     CT angiogram 9/11>> No PE; PFTs 10/11- FEV1 1.20 (49%) with 16% better p B2, DLCO 33%> corrects to 84; O2 sats ok on 4 lpm X rapid walk X 3 laps 05/2010  . Morbid obesity     Target wt= 153 for BMI <30  . QT prolongation   . Hypertension   . Hx of cardiac cath 2/08    No CAD, no RAS,  normal EF  . Seborrheic dermatitis of scalp   . Abnormal LFTs (liver function tests)       Liver U/S and exam c/w HSM. Hep B serology neg. but Hep C ab +, HIV neg. 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 Cardiomyopathy, nonischemic     EF 45% 12/10; Echo 7/11 normal EF, PAS 48  . Diabetic retinopathy     Right eye 2/11  . Health maintenance examination     Mammogram 05/2010 Negative; Last Pap smear 03/2008; Last DM eye exam 2/11> mild non-proliferative diabetic retinopathy. OD  . Helicobacter pylori ab+ 05/2011    Pt was symptomatic and treatment planned for 05/2011  . CHF (congestive heart failure)   . Complication of anesthesia     " difficult waking "  . Sleep apnea   . Diabetes mellitus     insulin dependent    Past Surgical History  Procedure Laterality Date  . Tubal ligation    . Breast surgery    . Cesarean section    . Tracheostomy tube placement N/A 08/10/2013    Procedure: TRACHEOSTOMY;  Surgeon: Melissa Montane, MD;  Location: [facility];  Service: ENT;  Laterality: N/A;    History   Social History  . Marital Status: Single    Spouse Name: N/A  . Number of Children: 2  . Years of Education: N/A   Occupational History  . 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 Smoking status: Former Smoker -- 0.30 packs/day for 20 years    Quit date: 06/29/2013  . Smokeless tobacco: Never Used     Comment: Needs to cut back.  . Alcohol Use: No  . Drug Use: No  . Sexual Activity: Not on file   Other Topics Concern  . Not on file   Social History Narrative   Diabetic card given 05/03/2010.   Financial assistance approved for 100% discount at [facility] and has [facility] card. [name] 12/07/2009.      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    ROS: cold symptoms but no fevers or chills, hemoptysis, dysphasia, odynophagia, melena, hematochezia, dysuria, hematuria, rash, seizure activity, orthopnea, PND, claudication. Remaining systems are negative.  Physical  Exam: Well-developed obese in no acute distress.  Skin is warm and dry.  HEENT is normal.  Neck is supple.  Chest is clear to auscultation with normal expansion.  Cardiovascular exam is regular rate and rhythm.  Abdominal exam nontender or distended. No masses palpated. Extremities show trace edema. neuro grossly intact  ECG 05/06/2014-sinus, left axis deviation.

## 2014-06-25 ENCOUNTER — Encounter: Payer: Self-pay | Admitting: Cardiology

## 2014-06-25 ENCOUNTER — Ambulatory Visit (INDEPENDENT_AMBULATORY_CARE_PROVIDER_SITE_OTHER): Payer: Medicare Other | Admitting: Cardiology

## 2014-06-25 VITALS — BP 116/80 | HR 92

## 2014-06-25 DIAGNOSIS — E785 Hyperlipidemia, unspecified: Secondary | ICD-10-CM

## 2014-06-25 DIAGNOSIS — I429 Cardiomyopathy, unspecified: Secondary | ICD-10-CM

## 2014-06-25 DIAGNOSIS — D869 Sarcoidosis, unspecified: Secondary | ICD-10-CM

## 2014-06-25 DIAGNOSIS — I5032 Chronic diastolic (congestive) heart failure: Secondary | ICD-10-CM | POA: Diagnosis not present

## 2014-06-25 DIAGNOSIS — I10 Essential (primary) hypertension: Secondary | ICD-10-CM | POA: Diagnosis not present

## 2014-06-25 NOTE — Assessment & Plan Note (Signed)
I discussed the importance of weight loss. 

## 2014-06-25 NOTE — Assessment & Plan Note (Signed)
Blood pressure controlled. Continue present medications. 

## 2014-06-25 NOTE — Assessment & Plan Note (Signed)
Mildly volume overloaded on examination. Continue present dose of Lasix. I have asked her to weigh herself daily. If she gains 2 pounds in one day she will take additional 40 mg of Lasix. We discussed low sodium diet and fluid restriction as well.

## 2014-06-25 NOTE — Assessment & Plan Note (Signed)
Continue statin. 

## 2014-06-25 NOTE — Assessment & Plan Note (Signed)
LV function improved on most recent echo. Continue beta blocker and ARB.

## 2014-06-25 NOTE — Patient Instructions (Signed)
Your physician wants you to follow-up in: 6 MONTHS WITH DR CRENSHAW You will receive a reminder letter in the mail two months in advance. If you don't receive a letter, please call our office to schedule the follow-up appointment.  

## 2014-06-25 NOTE — Assessment & Plan Note (Signed)
Followed by pulmonary 

## 2014-07-28 ENCOUNTER — Inpatient Hospital Stay (HOSPITAL_COMMUNITY): Payer: Medicare Other

## 2014-07-28 ENCOUNTER — Encounter: Payer: Self-pay | Admitting: Internal Medicine

## 2014-07-28 ENCOUNTER — Ambulatory Visit (INDEPENDENT_AMBULATORY_CARE_PROVIDER_SITE_OTHER): Payer: Medicare Other | Admitting: Internal Medicine

## 2014-07-28 ENCOUNTER — Other Ambulatory Visit: Payer: Self-pay

## 2014-07-28 ENCOUNTER — Inpatient Hospital Stay (HOSPITAL_COMMUNITY)
Admission: AD | Admit: 2014-07-28 | Discharge: 2014-07-31 | DRG: 292 | Disposition: A | Discharge status: Home / Self Care | Payer: Medicare Other | Source: Ambulatory Visit | Attending: Internal Medicine | Admitting: Internal Medicine

## 2014-07-28 VITALS — BP 117/67 | HR 112 | Temp 99.0°F | Ht 61.0 in | Wt 253.5 lb

## 2014-07-28 DIAGNOSIS — J189 Pneumonia, unspecified organism: Secondary | ICD-10-CM | POA: Diagnosis not present

## 2014-07-28 DIAGNOSIS — Z794 Long term (current) use of insulin: Secondary | ICD-10-CM | POA: Diagnosis not present

## 2014-07-28 DIAGNOSIS — J069 Acute upper respiratory infection, unspecified: Secondary | ICD-10-CM | POA: Diagnosis not present

## 2014-07-28 DIAGNOSIS — E1165 Type 2 diabetes mellitus with hyperglycemia: Secondary | ICD-10-CM | POA: Diagnosis present

## 2014-07-28 DIAGNOSIS — I429 Cardiomyopathy, unspecified: Secondary | ICD-10-CM | POA: Diagnosis present

## 2014-07-28 DIAGNOSIS — E119 Type 2 diabetes mellitus without complications: Secondary | ICD-10-CM | POA: Diagnosis present

## 2014-07-28 DIAGNOSIS — Z7952 Long term (current) use of systemic steroids: Secondary | ICD-10-CM | POA: Diagnosis not present

## 2014-07-28 DIAGNOSIS — Z6841 Body Mass Index (BMI) 40.0 and over, adult: Secondary | ICD-10-CM | POA: Diagnosis not present

## 2014-07-28 DIAGNOSIS — J209 Acute bronchitis, unspecified: Secondary | ICD-10-CM | POA: Diagnosis present

## 2014-07-28 DIAGNOSIS — Z930 Tracheostomy status: Secondary | ICD-10-CM | POA: Diagnosis not present

## 2014-07-28 DIAGNOSIS — Z9981 Dependence on supplemental oxygen: Secondary | ICD-10-CM | POA: Diagnosis not present

## 2014-07-28 DIAGNOSIS — I129 Hypertensive chronic kidney disease with stage 1 through stage 4 chronic kidney disease, or unspecified chronic kidney disease: Secondary | ICD-10-CM | POA: Diagnosis present

## 2014-07-28 DIAGNOSIS — R05 Cough: Secondary | ICD-10-CM | POA: Diagnosis not present

## 2014-07-28 DIAGNOSIS — E113292 Type 2 diabetes mellitus with mild nonproliferative diabetic retinopathy without macular edema, left eye: Secondary | ICD-10-CM

## 2014-07-28 DIAGNOSIS — N179 Acute kidney failure, unspecified: Secondary | ICD-10-CM | POA: Diagnosis present

## 2014-07-28 DIAGNOSIS — I10 Essential (primary) hypertension: Secondary | ICD-10-CM | POA: Diagnosis not present

## 2014-07-28 DIAGNOSIS — E11329 Type 2 diabetes mellitus with mild nonproliferative diabetic retinopathy without macular edema: Secondary | ICD-10-CM

## 2014-07-28 DIAGNOSIS — M545 Low back pain, unspecified: Secondary | ICD-10-CM

## 2014-07-28 DIAGNOSIS — K219 Gastro-esophageal reflux disease without esophagitis: Secondary | ICD-10-CM | POA: Diagnosis present

## 2014-07-28 DIAGNOSIS — E785 Hyperlipidemia, unspecified: Secondary | ICD-10-CM | POA: Diagnosis present

## 2014-07-28 DIAGNOSIS — D869 Sarcoidosis, unspecified: Secondary | ICD-10-CM | POA: Diagnosis present

## 2014-07-28 DIAGNOSIS — Z87891 Personal history of nicotine dependence: Secondary | ICD-10-CM | POA: Diagnosis not present

## 2014-07-28 DIAGNOSIS — R944 Abnormal results of kidney function studies: Secondary | ICD-10-CM | POA: Diagnosis not present

## 2014-07-28 DIAGNOSIS — N183 Chronic kidney disease, stage 3 unspecified: Secondary | ICD-10-CM | POA: Diagnosis present

## 2014-07-28 DIAGNOSIS — Z888 Allergy status to other drugs, medicaments and biological substances status: Secondary | ICD-10-CM | POA: Diagnosis not present

## 2014-07-28 DIAGNOSIS — R0602 Shortness of breath: Secondary | ICD-10-CM | POA: Diagnosis not present

## 2014-07-28 DIAGNOSIS — I5033 Acute on chronic diastolic (congestive) heart failure: Secondary | ICD-10-CM

## 2014-07-28 DIAGNOSIS — J40 Bronchitis, not specified as acute or chronic: Secondary | ICD-10-CM | POA: Diagnosis not present

## 2014-07-28 DIAGNOSIS — N289 Disorder of kidney and ureter, unspecified: Secondary | ICD-10-CM

## 2014-07-28 DIAGNOSIS — G4733 Obstructive sleep apnea (adult) (pediatric): Secondary | ICD-10-CM | POA: Diagnosis present

## 2014-07-28 DIAGNOSIS — N189 Chronic kidney disease, unspecified: Secondary | ICD-10-CM

## 2014-07-28 DIAGNOSIS — IMO0002 Reserved for concepts with insufficient information to code with codable children: Secondary | ICD-10-CM

## 2014-07-28 DIAGNOSIS — M549 Dorsalgia, unspecified: Secondary | ICD-10-CM | POA: Insufficient documentation

## 2014-07-28 LAB — CBC WITH DIFFERENTIAL/PLATELET
Basophils Absolute: 0 10*3/uL (ref 0.0–0.1)
Basophils Relative: 0 % (ref 0–1)
Eosinophils Absolute: 0.2 10*3/uL (ref 0.0–0.7)
Eosinophils Relative: 2 % (ref 0–5)
HCT: 39.4 % (ref 36.0–46.0)
Hemoglobin: 11.9 g/dL — ABNORMAL LOW (ref 12.0–15.0)
Lymphocytes Relative: 20 % (ref 12–46)
Lymphs Abs: 1.8 10*3/uL (ref 0.7–4.0)
MCH: 29.5 pg (ref 26.0–34.0)
MCHC: 30.2 g/dL (ref 30.0–36.0)
MCV: 97.8 fL (ref 78.0–100.0)
Monocytes Absolute: 0.8 10*3/uL (ref 0.1–1.0)
Monocytes Relative: 9 % (ref 3–12)
Neutro Abs: 6.2 10*3/uL (ref 1.7–7.7)
Neutrophils Relative %: 69 % (ref 43–77)
Platelets: 183 10*3/uL (ref 150–400)
RBC: 4.03 MIL/uL (ref 3.87–5.11)
RDW: 14.5 % (ref 11.5–15.5)
WBC: 9 10*3/uL (ref 4.0–10.5)

## 2014-07-28 LAB — COMPREHENSIVE METABOLIC PANEL
ALT: 14 U/L (ref 0–35)
AST: 21 U/L (ref 0–37)
Albumin: 3.1 g/dL — ABNORMAL LOW (ref 3.5–5.2)
Alkaline Phosphatase: 105 U/L (ref 39–117)
Anion gap: 4 — ABNORMAL LOW (ref 5–15)
BUN: 20 mg/dL (ref 6–23)
CO2: 36 mmol/L — ABNORMAL HIGH (ref 19–32)
Calcium: 7.9 mg/dL — ABNORMAL LOW (ref 8.4–10.5)
Chloride: 97 mmol/L (ref 96–112)
Creatinine, Ser: 1.75 mg/dL — ABNORMAL HIGH (ref 0.50–1.10)
GFR calc Af Amer: 39 mL/min — ABNORMAL LOW (ref >90)
GFR calc non Af Amer: 34 mL/min — ABNORMAL LOW (ref >90)
Glucose, Bld: 260 mg/dL — ABNORMAL HIGH (ref 70–99)
Potassium: 3.9 mmol/L (ref 3.5–5.1)
Sodium: 137 mmol/L (ref 135–145)
Total Bilirubin: 1.2 mg/dL (ref 0.3–1.2)
Total Protein: 7.1 g/dL (ref 6.0–8.3)

## 2014-07-28 LAB — CBC
HCT: 41.5 % (ref 36.0–46.0)
Hemoglobin: 12.8 g/dL (ref 12.0–15.0)
MCH: 29.7 pg (ref 26.0–34.0)
MCHC: 30.8 g/dL (ref 30.0–36.0)
MCV: 96.3 fL (ref 78.0–100.0)
Platelets: 162 10*3/uL (ref 150–400)
RBC: 4.31 MIL/uL (ref 3.87–5.11)
RDW: 14.5 % (ref 11.5–15.5)
WBC: 9.4 10*3/uL (ref 4.0–10.5)

## 2014-07-28 LAB — POCT GLYCOSYLATED HEMOGLOBIN (HGB A1C): Hemoglobin A1C: 9.7

## 2014-07-28 LAB — GLUCOSE, CAPILLARY
Glucose-Capillary: 272 mg/dL — ABNORMAL HIGH (ref 70–99)
Glucose-Capillary: 295 mg/dL — ABNORMAL HIGH (ref 70–99)

## 2014-07-28 LAB — MAGNESIUM: Magnesium: 1.2 mg/dL — ABNORMAL LOW (ref 1.5–2.5)

## 2014-07-28 LAB — BRAIN NATRIURETIC PEPTIDE: B Natriuretic Peptide: 154.7 pg/mL — ABNORMAL HIGH (ref 0.0–100.0)

## 2014-07-28 MED ORDER — INSULIN DETEMIR 100 UNIT/ML ~~LOC~~ SOLN
15.0000 [IU] | Freq: Two times a day (BID) | SUBCUTANEOUS | Status: DC
  Administered 2014-07-28 – 2014-07-29 (×3): 15 [IU] via SUBCUTANEOUS
  Filled 2014-07-28 (×4): qty 0.15

## 2014-07-28 MED ORDER — FUROSEMIDE 10 MG/ML IJ SOLN
40.0000 mg | Freq: Two times a day (BID) | INTRAMUSCULAR | Status: DC
  Administered 2014-07-28: 40 mg via INTRAVENOUS
  Filled 2014-07-28 (×2): qty 4

## 2014-07-28 MED ORDER — HEPARIN SODIUM (PORCINE) 5000 UNIT/ML IJ SOLN
5000.0000 [IU] | Freq: Three times a day (TID) | INTRAMUSCULAR | Status: DC
  Administered 2014-07-28 – 2014-07-31 (×9): 5000 [IU] via SUBCUTANEOUS
  Filled 2014-07-28 (×11): qty 1

## 2014-07-28 MED ORDER — IRBESARTAN 150 MG PO TABS
150.0000 mg | ORAL_TABLET | Freq: Every day | ORAL | Status: DC
  Administered 2014-07-29: 150 mg via ORAL
  Filled 2014-07-28 (×2): qty 1

## 2014-07-28 MED ORDER — INSULIN ASPART 100 UNIT/ML ~~LOC~~ SOLN
0.0000 [IU] | Freq: Three times a day (TID) | SUBCUTANEOUS | Status: DC
  Administered 2014-07-29 (×2): 7 [IU] via SUBCUTANEOUS
  Administered 2014-07-29: 3 [IU] via SUBCUTANEOUS
  Administered 2014-07-30 (×2): 7 [IU] via SUBCUTANEOUS
  Administered 2014-07-30: 3 [IU] via SUBCUTANEOUS
  Administered 2014-07-31: 5 [IU] via SUBCUTANEOUS
  Administered 2014-07-31: 7 [IU] via SUBCUTANEOUS

## 2014-07-28 MED ORDER — PANTOPRAZOLE SODIUM 40 MG PO TBEC
40.0000 mg | DELAYED_RELEASE_TABLET | Freq: Every day | ORAL | Status: DC
  Administered 2014-07-29 – 2014-07-31 (×3): 40 mg via ORAL
  Filled 2014-07-28 (×3): qty 1

## 2014-07-28 MED ORDER — AMLODIPINE BESYLATE 5 MG PO TABS
5.0000 mg | ORAL_TABLET | Freq: Every day | ORAL | Status: DC
  Administered 2014-07-29 – 2014-07-31 (×3): 5 mg via ORAL
  Filled 2014-07-28 (×3): qty 1

## 2014-07-28 MED ORDER — ATORVASTATIN CALCIUM 20 MG PO TABS
20.0000 mg | ORAL_TABLET | Freq: Every day | ORAL | Status: DC
  Administered 2014-07-28 – 2014-07-31 (×4): 20 mg via ORAL
  Filled 2014-07-28 (×4): qty 1

## 2014-07-28 MED ORDER — PREDNISONE 5 MG PO TABS
5.0000 mg | ORAL_TABLET | ORAL | Status: DC
  Administered 2014-07-30: 5 mg via ORAL
  Filled 2014-07-28 (×3): qty 1

## 2014-07-28 MED ORDER — CARVEDILOL 12.5 MG PO TABS
12.5000 mg | ORAL_TABLET | Freq: Two times a day (BID) | ORAL | Status: DC
  Administered 2014-07-28 – 2014-07-31 (×6): 12.5 mg via ORAL
  Filled 2014-07-28 (×7): qty 1

## 2014-07-28 MED ORDER — PREDNISONE 5 MG PO TABS: 5.0000 mg | ORAL_TABLET | Freq: Every day | ORAL | Status: DC

## 2014-07-28 MED ORDER — PREDNISONE 10 MG PO TABS
10.0000 mg | ORAL_TABLET | ORAL | Status: DC
  Administered 2014-07-29 – 2014-07-31 (×2): 10 mg via ORAL
  Filled 2014-07-28 (×2): qty 1

## 2014-07-28 NOTE — Assessment & Plan Note (Addendum)
Weight up ~10 pounds, recent worsening of dyspnea and DOE, and lower extremity edema. Reports last two days worse than usual and today was the worst with her feeling tired as well. Has not been weighing herself daily and has only been taking lasix 40mg  daily instead of increasing dose if weight was going up as per cardiology recommendations on last follow up visit. Of note, initial o2 sats listed as 84% on 2L [place] portable oxygen, was briefly placed on 4L with improving saturation and brought back to 2L [place] O2 in room with o2 sats 89-92%.  Admit to IMTS today for acute on chronic exacerbation Will obtain stat labs in [facility] and she will likely need diuresis with close monitoring Likely will need CXR as well Continuous pulse oximetry on the floor for admission

## 2014-07-28 NOTE — Assessment & Plan Note (Addendum)
On chronic prednisone, however has been out of her medication for 2 days. Usually takes 5mg  one day and 10mg  the next.  Will need to follow up with pulmonary and trach clinic -refill prednisone on discharge from hospital based on her recommended treatment doses per admitting team

## 2014-07-28 NOTE — Assessment & Plan Note (Signed)
Per most recent retinal images 06/2013. Was to have follow up pictures today, however, she was admitted to the hospital. Otherwise, she did mention to RN she follows with [facility] for her eyes.   -I has RN to call [facility] office to see the last time she was seen there -follow up retinal scan on next visit

## 2014-07-28 NOTE — Progress Notes (Signed)
Report called [facility] on [address].  Patient transported via wheelchair to [place] [facility].  Patient currently on 4 liters of Oxygen.  Family accompanying patient to floor.  Sander Nephew, RN 07/28/2014 2:51 PM

## 2014-07-28 NOTE — Assessment & Plan Note (Addendum)
Acute x2 days related on onset of her menstrual cycle and also she has been coughing. She denies any radiation of the pain, not down her extremities and says the pain is mild and mainly noticed if she is lying down for a long time. Mild tenderness to deep palpation on exam today. Likely MSK vs. Secondary to menstrual cycle but will continue to monitor. She is being admitted to the hospital today for acute on chronic heart failure and will relay her complaints of back pain to admitting team as well who may need to conduct further work up or treatment. If persistent, no improvement, or any worsening, will need to revisit this complaint on follow up.

## 2014-07-28 NOTE — Progress Notes (Signed)
Subjective:   Patient ID: Melody Gray female   DOB: [birth year] 44 y.o.   MRN: [id number]  HPI: [name] is a 44 y.o. with PMH as listed below presenting to opc today for diabetes follow up visit.   DM2--A1C 8.6 05/2014, increased to 9.7 today.  Admits to CBG 200-300s at home requiring high doses of her sliding scale novolog.   Her main complaint today is worsening of her breathing. She has DOE that got much worse today with now mild wheezing as well. Her weight is up ~10 pounds, she has not been weighing herself daily, and has only been taking 40mg  of lasix daily. She is compliant with all her medications. She has unfortunately started smoking again for the past 3 months, 1 pack lasting 3 days. She reports a productive cough x2 days with yellow sputum and feeling cold which is unusual for her. She denies sick contacts. No recent choking spells. +mild headache since last night, no nausea, vomiting, diarrhea, or abdominal pain. Denies sinus pressure.   She also reports having some mild lower back pain since starting her period. Non-radiating pain, noted more when she lays down or on palpation. Onset x2 days. She thinks she may have slept wrong or it is due to her menstrual cycle with pain that she occasionally has.   Past Medical History  Diagnosis Date  . Sarcoidosis     Followed by Dr. Melvyn Novas; w/ liver involvement per biopsy 12/09, Reversible airway component so started on [facility] 01/2010; HFA 75% p coaching 05/2010  . Hypoxemia     CT angiogram 9/11>> No PE; PFTs 10/11- FEV1 1.20 (49%) with 16% better p B2, DLCO 33%> corrects to 84; O2 sats ok on 4 lpm X rapid walk X 3 laps 05/2010  . Morbid obesity     Target wt= 153 for BMI <30  . QT prolongation   . Hypertension   . Hx of cardiac cath 2/08    No CAD, no RAS,  normal EF  . Seborrheic dermatitis of scalp   . 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 Hep B serology neg. but Hep C ab +, HIV neg. AMA and  Hep C viral load neg.; Liver biopsy 12/09 c/w liver sarcoid and portal fibrosis  . Cardiomyopathy, nonischemic     EF 45% 12/10; Echo 7/11 normal EF, PAS 48  . Diabetic retinopathy     Right eye 2/11  . Health maintenance examination     Mammogram 05/2010 Negative; Last Pap smear 03/2008; Last DM eye exam 2/11> mild non-proliferative diabetic retinopathy. OD  . Helicobacter pylori ab+ 05/2011    Pt was symptomatic and treatment planned for 05/2011  . CHF (congestive heart failure)   . Complication of anesthesia     " difficult waking "  . Sleep apnea   . Diabetes mellitus     insulin dependent   Current Outpatient Prescriptions  Medication Sig Dispense Refill  . albuterol (PROVENTIL HFA;VENTOLIN HFA) 108 (90 BASE) MCG/ACT inhaler Inhale 2 puffs into the lungs every 6 (six) hours as needed for wheezing or shortness of breath. 1 Inhaler 2  . amLODipine (NORVASC) 5 MG tablet take 1 tablet by mouth once daily 30 tablet 5  . atorvastatin (LIPITOR) 20 MG tablet Take 1 tablet (20 mg total) by mouth daily at 6 PM. 90 tablet 3  . B-D INS SYRINGE 0.5CC/30GX1/2" 30G X 1/2" 0.5 ML MISC USE AS DIRECTED  100 each 1  . carvedilol (COREG) 12.5 MG tablet Take 1 tablet (12.5 mg total) by mouth 2 (two) times daily. 180 tablet 3  . Dextromethorphan-Guaifenesin (MUCINEX DM MAXIMUM STRENGTH) 60-1200 MG TB12 One every 12 hours as needed for cough    . ferrous sulfate 325 (65 FE) MG tablet Take 1 tablet (325 mg total) by mouth 3 (three) times daily with meals. 90 tablet 3  . furosemide (LASIX) 40 MG tablet Take 40 mg by mouth daily.    . insulin aspart (NOVOLOG) 100 UNIT/ML injection Take novolog per sliding scale: CBG 70-120: 0 units  CBG 121-150: 1 unit  CBG 151-200: 2 units  CBG 201-250: 3 units  CBG 251-300: 5 units  CBG 301-350: 7 units  CBG 351-400: 9 units  CBG > 400: call MD 30 mL 3  . insulin detemir (LEVEMIR) 100 UNIT/ML injection 28 units every morning and 20 units every night. diag code 250.00      . irbesartan (AVAPRO) 150 MG tablet Take 1 tablet (150 mg total) by mouth daily. 30 tablet 3  . Melatonin 3 MG TABS Take 1 tablet (3 mg total) by mouth at bedtime as needed. (Patient taking differently: Take 3 mg by mouth at bedtime as needed (sleep). )  0  . metFORMIN (GLUCOPHAGE) 1000 MG tablet take 1 tablet by mouth twice a day with meals 180 tablet 4  . omeprazole (PRILOSEC) 20 MG capsule Take 1 capsule (20 mg total) by mouth daily. 30 capsule 3  . predniSONE (DELTASONE) 10 MG tablet Take 5-10 mg by mouth daily with breakfast. Alternate between 5mg  and 10mg      No current facility-administered medications for this visit.   Family History  Problem Relation Age of Onset  . Cancer Mother     colon cancer  . Multiple sclerosis Father   . Asthma Sister     in childhood  . Diabetes Father   . Hypertension     History   Social History  . Marital Status: Single    Spouse Name: N/A  . Number of Children: 2  . Years of Education: N/A   Occupational History  . 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 Smoking status: Former Smoker -- 0.30 packs/day for 20 years    Quit date: 06/29/2013  . Smokeless tobacco: Never Used     Comment: Needs to cut back.  . Alcohol Use: No  . Drug Use: No  . Sexual Activity: Not on file   Other Topics Concern  . Not on file   Social History Narrative   Diabetic card given 05/03/2010.   Financial assistance approved for 100% discount at [facility] and has [facility] card. [name] 12/07/2009.      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   Review of Systems:  Constitutional:  Chills, decreased appetite, fatigue  HEENT:  Trach dependent  Respiratory:  SOB, DOE, productive cough, wheezing  Cardiovascular:  Chest tightness under breasts after eating, lower extremity swelling. Denies chest pain at this time.  Gastrointestinal:  Denies nausea, vomiting, abdominal pain  Genitourinary:  Frequency   Musculoskeletal:  B/l lower extremity edema, mild mid lower back pain  Skin:  Dry  Neurological:  Headache   Objective:  Physical Exam: Filed Vitals:   07/28/14 1331  BP: 117/67  Pulse: 112  Temp: 99 F (37.2 C)  TempSrc: Oral  Height: 5\' 1"  (1.549 m)  Weight: 253 lb 8 oz (114.987 kg)  SpO2: 84%   Vitals reviewed. General: sitting in wheelchair, feeling tired HEENT: EOMI, -tenderness to palpation of maxillary and frontal sinus, on Elmo O2 Cardiac: Tachycardia Pulm: b/l diffuse expiratory wheezing, decreased breath sounds at bases, left more than right, ?mild rales on RLL Abd: soft, obese, non-tender, +bs Ext: b/l pitting edema, faint distal pulses to palpation but obtained with doppler. +tenderness to deep palpation of mid lower back above hips, non-tender on b/l sides of lower back Neuro: alert and oriented X3  Assessment & Plan:  Discussed with Dr. Daryll Drown Admit to IMTS--acute on chronic CHF

## 2014-07-28 NOTE — Assessment & Plan Note (Signed)
Admitted to IMTS today for worsening dyspnea.   Repeat CXR advised

## 2014-07-28 NOTE — H&P (Signed)
Date: 07/28/2014               Patient Name:  Melody Gray MRN: [id number]  DOB: Jul 24, 1970 Age / Sex: 44 y.o., female   PCP: Melody Oliphant, MD         Medical Service: Internal Medicine Teaching Service         Attending Physician: Dr. Annia Belt, MD    First Contact: Dr. Marvel Gray Pager: [phone]  Second Contact: Dr. Denton Gray Pager: [phone]       After Hours (After 5p/  First Contact Pager: [phone]  weekends / holidays): Second Contact Pager: [phone]   Chief Complaint: [name] is a 44 yo female with PMHx of Grade 2 Diastolic CHF, Sarcoidosis, T2DM, OSA with tracheostomy, CKD, HTN, GERD, Cardiomyopathy, and HLD who was admitted from the clinic. Patient presented for a routine follow up for diabetes today, but had acute complaints of shortness of breath worsening over the past 2 days. Patient states she noticed increased shortness of breath, productive cough with yellow sputum and increased hand and leg swelling that started 2 days ago. Sputum amount is consistent with a teaspoon each time. She was unable to lie in her bed last night to sleep. She fell asleep in a chair. She normally sleeps on 2 pillows with the head of the bed raised as it is a hospital bed. She states this is not due to shortness of breath but rather because lying flat when she is fluid overloaded causes her discomfort in her abdomen. She knows she has been gaining weight, but is unsure how much as she does not have a scale at home. She also does not know her dry weight. She has been compliant with all of her medications. She follows with Dr. Stanford Gray who she last saw one month ago. He instructed her to take an extra lasix 40 mg if she gained more than 2 pounds, but she has not done this. She admits to chills, shortness of breath, productive cough, and edema in her hands and lower extremities, but she denies fever, chest pain, sore throat, ear pain, sinus pressure, nausea, vomiting, diarrhea or constipation. She  denies sick contacts. She believes her exacerbation is due to the weather and season change as she states her allergies and warmer weather cause this issues for her.   She admits to 1/3 ppd of tobacco, but denies alcohol or illicit drug use. She has been eating and drinking normally.   History of Present Illness:   Meds: Current Facility-Administered Medications  Medication Dose Route Frequency Provider Last Rate Last Dose  . [START ON 07/29/2014] amLODipine (NORVASC) tablet 5 mg  5 mg Oral Daily Melody Avers, MD      . atorvastatin (LIPITOR) tablet 20 mg  20 mg Oral q1800 Melody Avers, MD      . carvedilol (COREG) tablet 12.5 mg  12.5 mg Oral BID Melody Avers, MD      . furosemide (LASIX) injection 40 mg  40 mg Intravenous BID Melody Avers, MD      . heparin injection 5,000 Units  5,000 Units Subcutaneous 3 times per day Melody Avers, MD      . Derrill Memo ON 07/29/2014] insulin aspart (novoLOG) injection 0-9 Units  0-9 Units Subcutaneous TID WC Melody Hinde Sherral Hammers, MD      . insulin detemir (LEVEMIR) injection 15 Units  15 Units Subcutaneous BID Melody Gray Sherral Hammers, MD      . Derrill Memo ON 07/29/2014] irbesartan (AVAPRO) tablet  150 mg  150 mg Oral Daily Melody Avers, MD      . Derrill Memo ON 07/29/2014] pantoprazole (PROTONIX) EC tablet 40 mg  40 mg Oral Daily Melody Avers, MD      . Derrill Memo ON 07/29/2014] predniSONE (DELTASONE) tablet 10 mg  10 mg Oral Q48H Melody Avers, MD       And  . Derrill Memo ON 07/30/2014] predniSONE (DELTASONE) tablet 5 mg  5 mg Oral Q48H Melody Avers, MD        Allergies: Allergies as of 07/28/2014 - Review Complete 07/28/2014  Allergen Reaction Noted  . Vicodin [hydrocodone-acetaminophen] Itching 11/17/2010   Past Medical History  Diagnosis Date  . Sarcoidosis     Followed by Melody Gray; w/ liver involvement per biopsy 12/09, Reversible airway component so started on [facility] 01/2010; HFA 75% p coaching 05/2010  . Hypoxemia     CT angiogram 9/11>> No PE; PFTs  10/11- FEV1 1.20 (49%) with 16% better p B2, DLCO 33%> corrects to 84; O2 sats ok on 4 lpm X rapid walk X 3 laps 05/2010  . Morbid obesity     Target wt= 153 for BMI <30  . QT prolongation   . Hypertension   . Hx of cardiac cath 2/08    No CAD, no RAS,  normal EF  . Seborrheic dermatitis of scalp   . 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 Hep B serology neg. but Hep C ab +, HIV neg. 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 Cardiomyopathy, nonischemic     EF 45% 12/10; Echo 7/11 normal EF, PAS 48  . Diabetic retinopathy     Right eye 2/11  . Health maintenance examination     Mammogram 05/2010 Negative; Last Pap smear 03/2008; Last DM eye exam 2/11> mild non-proliferative diabetic retinopathy. OD  . Helicobacter pylori ab+ 05/2011    Pt was symptomatic and treatment planned for 05/2011  . CHF (congestive heart failure)   . Complication of anesthesia     " difficult waking "  . Sleep apnea   . Diabetes mellitus     insulin dependent   Past Surgical History  Procedure Laterality Date  . Tubal ligation    . Breast surgery    . Cesarean section    . Tracheostomy tube placement N/A 08/10/2013    Procedure: TRACHEOSTOMY;  Surgeon: Melody Montane, MD;  Location: [facility] OR;  Service: ENT;  Laterality: N/A;   Family History  Problem Relation Age of Onset  . Cancer Mother     colon cancer  . Multiple sclerosis Father   . Asthma Sister     in childhood  . Diabetes Father   . Hypertension     History   Social History  . Marital Status: Single    Spouse Name: N/A  . Number of Children: 2  . Years of Education: N/A   Occupational History  . 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 Smoking status: Former Smoker -- 0.30 packs/day for 20 years    Quit date: 06/29/2013  . Smokeless tobacco: Never Used     Comment: Needs to cut back.  . Alcohol Use: No  . Drug Use: No  . Sexual Activity:  Not on file   Other Topics Concern  . Not on file   Social History Narrative   Diabetic  card given 05/03/2010.   Financial assistance approved for 100% discount at [facility] and has [facility] card. [name] 12/07/2009.      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    Review of Systems: General: Admits to chills and fatigue. Denies fever, change in appetite and diaphoresis.  HEENT: Denies sinus pressure, tenderness. Denies ear pain. Denies sore throat.  Respiratory: Admits to SOB, productive cough, orthopnea, but denies DOE, chest tightness.   Cardiovascular: Denies chest pain and palpitations.  Gastrointestinal: Denies nausea, vomiting, abdominal pain, diarrhea, constipation Genitourinary: Admits to urinary frequency. Denies dysuria, urgency Endocrine: Admits to polyuria. Denies polydipsia. Musculoskeletal: Admits to lower extremity swelling. Skin: Denies pallor, rash and wounds.  Neurological: Denies dizziness, headaches, weakness, lightheadedness  Physical Exam: Filed Vitals:   07/28/14 1448 07/28/14 1600  BP:  129/83  Pulse: 84 88  Resp: 18 20  Height:  5\' 1"  (1.549 m)  Weight:  249 lb (112.946 kg)  SpO2: 99%    General: Vital signs reviewed.  Patient is well-developed and well-nourished, in no acute distress and cooperative with exam.   HEENT: JVD difficult to assess Cardiovascular: RRR Pulmonary/Chest: Mild inspiratory crackles in lower left lung field. No wheezes or rhonchi. Abdominal: Soft, non-tender, non-distended, BS +, obese, no guarding present.  Extremities: 2+ pitting edema in lower extremities bilaterally  Neurological: A&O x3 Skin: Warm, dry and intact. No rashes or erythema. Psychiatric: Normal mood and affect. speech and behavior is normal. Cognition and memory are normal.   Lab results: Basic Metabolic Panel:  Recent Labs  07/28/14 1430  NA 137  K 3.9  CL 97  CO2 36*  GLUCOSE 260*  BUN 20  CREATININE 1.75*  CALCIUM 7.9*    Liver Function Tests:  Recent Labs  07/28/14 1430  AST 21  ALT 14  ALKPHOS 105  BILITOT 1.2  PROT 7.1  ALBUMIN 3.1*   CBC:  Recent Labs  07/28/14 1430 07/28/14 1654  WBC 9.0 9.4  NEUTROABS 6.2  --   HGB 11.9* 12.8  HCT 39.4 41.5  MCV 97.8 96.3  PLT 183 162   CBG:  Recent Labs  07/28/14 1327  GLUCAP 272*   Hemoglobin A1C:  Recent Labs  07/28/14 1337  HGBA1C 9.7   Assessment & Gray by Problem: Principal Problem:   Acute on chronic diastolic congestive heart failure Active Problems:   Sarcoidosis   Diabetes mellitus type II, uncontrolled   OBESITY, MORBID   Essential hypertension   CKD (chronic kidney disease) stage 3, GFR 30-59 ml/min  Acute on Chronic Grade 2 Diastolic CHF Exacerbation: Patient presents with increasing shortness of breath and lower extremity edema. Vital signs on admission showed she was afebrile at 99, normotensive at 117/67, tachycardic at 112, RR 20 and satting 84% on room air. Physical exam revealed mild inspiratory crackles and 2+ pitting edema in the lower extremities bilaterally. Labs showed no leukocytosis. Last echo in June 2015 showed grade 2 diastolic dysfunction and normal EF 60-65%. Patient is on carvedilol 12.5 mg BID, lasix 40 mg daily, irbesartan 150 mg daily at home.  -Admit for CHF exacerbation -Continue carvedilol 12.5 mg BID -Lasix 40 mg IV BID -Continue irbesartan 150 mg daily -Repeat CBC/BMET tomorrow am -BNP -Telemetry -Renal diet with fluid restriction -Magnesium -Strict I/Os -CXR -Daily weights  Acute on Chronic Kidney Disease: Creatinine 1.75 on admission. Unclear baseline, possibly around 1.3-1.4. We will continue to monitor is setting of diuresis.  -Repeat BMET tomorrow morning  Sarcoidosis: Patient is on prednisone daily alternating 5 mg with 10 mg every day. She ran out on Friday and has not taken any since. -Prednisone 5 mg Wednesday Q48 hours -Prednisone 10 mg Thursday Q48 hours -CXR  T2DM:  HgbA1c 9.7 on 07/28/14. Patient is on levemir 28 units in the morning and 20 units at night and SSI Novolog at home. -CBGs AC/HS -Levemir 15 units BID -SSI-S  OSA: Patient is status post tracheostomy last year for severe sleep apnea. She cleans the trach herself and have it changed next in May. -Oxygen prn  HTN: Normotensive, BP 117/67 on admission. Patient is on amlodipine 5 mg daily, carvedilol 12.5 mg BID, lasix 40 mg daily, and irbesartan 150 mg daily at home. -Continue amlodipine 5 mg daily -Continue carvedilol 12.5 mg BID -Lasix 40 mg IV BID -Continue Irbesartan 150 mg daily  GERD: History of GERD. Patient is on omeprazole 20 mg daily. -Protonix 40 mg daily  HLD: Last lipid profile in September 2015 showed cholesterol 238, TG 252, HDL 70, LDL 118. Patient is on atorvastatin 20 mg daily at home. -Continue atorvastatin 20 mg daily  DVT/PE ppx: Heparin SQ TID  CODE: Full  FEN: Renal diet, fluid restriction  Dispo: Disposition is deferred at this time, awaiting improvement of current medical problems. Anticipated discharge in approximately 1-2 day(s).   The patient does have a current PCP Melody Oliphant, MD) and does need an [facility] hospital follow-up appointment after discharge.  The patient does not have transportation limitations that hinder transportation to clinic appointments.  Signed: Osa Craver, DO PGY-1 Internal Medicine Resident Pager # [phone] 07/28/2014 5:16 PM

## 2014-07-28 NOTE — Assessment & Plan Note (Addendum)
Lab Results  Component Value Date   HGBA1C 9.7 07/28/2014   HGBA1C 8.6* 05/06/2014   HGBA1C 8.5 02/19/2014    Assessment: Diabetes control: poor control (HgbA1C >9%) Progress toward A1C goal:  deteriorated Comments: reports cbg 200-300s at home, did not bring her meter today  Plan: Medications:  continue current medications levemir 28 units in AM and 20 units in PM and novolog per sliding scale:  CBG 70-120: 0 units CBG 121-150: 1 unit CBG 151-200: 2 units CBG 201-250: 3 units CBG 251-300: 5 units CBG 301-350: 7 units CBG 351-400: 9 units CBG > 400: call MD  Home glucose monitoring: Frequency: 3 times a day Timing: before meals Instruction/counseling given: reminded to get eye exam, reminded to bring blood glucose meter & log to each visit, reminded to bring medications to each visit, discussed the need for weight loss and discussed diet Educational resources provided: brochure Self management tools provided:   Other plans: will likely need adjustment of insulin regimen but currently being admitted to the hospital. Insulin may be adjusted during admission and will need to follow up in opc upon discharge. It would be best to have her meter readings to best dose her insulin.

## 2014-07-28 NOTE — Patient Instructions (Signed)
You are being admitted to the hospital today for worsening of your breathing. Please return to clinic for hospital follow up visit once you improve and are safely discharged from the hospital.

## 2014-07-28 NOTE — Assessment & Plan Note (Addendum)
S/p recent hospital admission for possible aspiration PNA and CHF exacerbation.  Admitted to IMTS today for worsening dyspnea.   CXR recommended on admission for further evaluation

## 2014-07-28 NOTE — Assessment & Plan Note (Signed)
BP Readings from Last 3 Encounters:  07/28/14 129/83  07/28/14 117/67  06/25/14 116/80   Lab Results  Component Value Date   NA 137 07/28/2014   K 3.9 07/28/2014   CREATININE 1.75* 07/28/2014   Assessment: Blood pressure control: controlled Progress toward BP goal:  at goal  Plan: Medications:  continue current medications avapro 150mg  daily, lasix 40mg  qd (may need to take more based on fluid status), coreg 12.5mg  bid, and norvasc 5mg  daily

## 2014-07-28 NOTE — Assessment & Plan Note (Signed)
Has been out of her prednisone x2 days. Usually alternates between 5mg  and 10mg  every other day.   Will need refills, likely on hospital discharge based on recommended treatment dose

## 2014-07-29 ENCOUNTER — Other Ambulatory Visit: Payer: Self-pay

## 2014-07-29 ENCOUNTER — Encounter (HOSPITAL_COMMUNITY): Payer: Self-pay | Admitting: General Practice

## 2014-07-29 DIAGNOSIS — N179 Acute kidney failure, unspecified: Secondary | ICD-10-CM

## 2014-07-29 DIAGNOSIS — E785 Hyperlipidemia, unspecified: Secondary | ICD-10-CM

## 2014-07-29 DIAGNOSIS — I129 Hypertensive chronic kidney disease with stage 1 through stage 4 chronic kidney disease, or unspecified chronic kidney disease: Secondary | ICD-10-CM

## 2014-07-29 DIAGNOSIS — G4733 Obstructive sleep apnea (adult) (pediatric): Secondary | ICD-10-CM

## 2014-07-29 DIAGNOSIS — R944 Abnormal results of kidney function studies: Secondary | ICD-10-CM

## 2014-07-29 DIAGNOSIS — E119 Type 2 diabetes mellitus without complications: Secondary | ICD-10-CM

## 2014-07-29 DIAGNOSIS — D869 Sarcoidosis, unspecified: Secondary | ICD-10-CM

## 2014-07-29 DIAGNOSIS — N189 Chronic kidney disease, unspecified: Secondary | ICD-10-CM

## 2014-07-29 DIAGNOSIS — J40 Bronchitis, not specified as acute or chronic: Secondary | ICD-10-CM

## 2014-07-29 DIAGNOSIS — K219 Gastro-esophageal reflux disease without esophagitis: Secondary | ICD-10-CM

## 2014-07-29 DIAGNOSIS — J209 Acute bronchitis, unspecified: Secondary | ICD-10-CM | POA: Diagnosis present

## 2014-07-29 DIAGNOSIS — I5033 Acute on chronic diastolic (congestive) heart failure: Principal | ICD-10-CM

## 2014-07-29 LAB — GLUCOSE, CAPILLARY
Glucose-Capillary: 223 mg/dL — ABNORMAL HIGH (ref 70–99)
Glucose-Capillary: 324 mg/dL — ABNORMAL HIGH (ref 70–99)
Glucose-Capillary: 341 mg/dL — ABNORMAL HIGH (ref 70–99)
Glucose-Capillary: 338 mg/dL — ABNORMAL HIGH (ref 70–99)

## 2014-07-29 LAB — BASIC METABOLIC PANEL
Anion gap: 7 (ref 5–15)
BUN: 25 mg/dL — ABNORMAL HIGH (ref 6–23)
CO2: 34 mmol/L — ABNORMAL HIGH (ref 19–32)
Calcium: 7.7 mg/dL — ABNORMAL LOW (ref 8.4–10.5)
Chloride: 97 mmol/L (ref 96–112)
Creatinine, Ser: 2.16 mg/dL — ABNORMAL HIGH (ref 0.50–1.10)
GFR calc Af Amer: 31 mL/min — ABNORMAL LOW (ref >90)
GFR calc non Af Amer: 26 mL/min — ABNORMAL LOW (ref >90)
Glucose, Bld: 288 mg/dL — ABNORMAL HIGH (ref 70–99)
Potassium: 4.3 mmol/L (ref 3.5–5.1)
Sodium: 138 mmol/L (ref 135–145)

## 2014-07-29 LAB — CBC
HCT: 36.6 % (ref 36.0–46.0)
Hemoglobin: 10.8 g/dL — ABNORMAL LOW (ref 12.0–15.0)
MCH: 29.2 pg (ref 26.0–34.0)
MCHC: 29.5 g/dL — ABNORMAL LOW (ref 30.0–36.0)
MCV: 98.9 fL (ref 78.0–100.0)
Platelets: 166 10*3/uL (ref 150–400)
RBC: 3.7 MIL/uL — ABNORMAL LOW (ref 3.87–5.11)
RDW: 14.8 % (ref 11.5–15.5)
WBC: 9.1 10*3/uL (ref 4.0–10.5)

## 2014-07-29 LAB — MICROALBUMIN / CREATININE URINE RATIO
Creatinine, Urine: 115.2 mg/dL
Microalb Creat Ratio: 354.2 mg/g — ABNORMAL HIGH (ref 0.0–30.0)
Microalb, Ur: 40.8 mg/dL — ABNORMAL HIGH (ref <2.0)

## 2014-07-29 LAB — MRSA PCR SCREENING: MRSA by PCR: NEGATIVE

## 2014-07-29 MED ORDER — GUAIFENESIN-DM 100-10 MG/5ML PO SYRP
5.0000 mL | ORAL_SOLUTION | ORAL | Status: DC | PRN
  Administered 2014-07-29 – 2014-07-30 (×2): 5 mL via ORAL
  Filled 2014-07-29 (×2): qty 5

## 2014-07-29 MED ORDER — MAGNESIUM SULFATE 2 GM/50ML IV SOLN
2.0000 g | Freq: Once | INTRAVENOUS | Status: AC
  Administered 2014-07-29: 2 g via INTRAVENOUS
  Filled 2014-07-29: qty 50

## 2014-07-29 MED ORDER — FUROSEMIDE 40 MG PO TABS
40.0000 mg | ORAL_TABLET | Freq: Every day | ORAL | Status: DC
Start: 2014-07-29 — End: 2014-07-30
  Administered 2014-07-29: 40 mg via ORAL
  Filled 2014-07-29 (×2): qty 1

## 2014-07-29 MED ORDER — IPRATROPIUM-ALBUTEROL 0.5-2.5 (3) MG/3ML IN SOLN: 3.0000 mL | RESPIRATORY_TRACT | Status: DC | PRN

## 2014-07-29 NOTE — Discharge Summary (Signed)
Name: Melody Gray MRN: [id number] DOB: Sep 22, 1970 44 y.o. PCP: Wilber Oliphant, MD  Date of Admission: 07/28/2014  3:15 PM Date of Discharge: 07/31/2014 Attending Physician: Aldine Contes, MD  Discharge Diagnosis:  Principal Problem:   Acute Viral URI   Active Problems:   Sarcoidosis   Diabetes mellitus type II, uncontrolled   OBESITY, MORBID   Essential hypertension   Chronic diastolic congestive heart failure   Acute on chronic renal insufficiency   Discharge Medications:   Medication List    STOP taking these medications        ALKA-SELTZER PLUS DAY COLD/FLU PO      TAKE these medications        albuterol 108 (90 BASE) MCG/ACT inhaler  Commonly known as:  PROVENTIL HFA;VENTOLIN HFA  Inhale 2 puffs into the lungs every 6 (six) hours as needed for wheezing or shortness of breath.     amLODipine 5 MG tablet  Commonly known as:  NORVASC  take 1 tablet by mouth once daily     atorvastatin 20 MG tablet  Commonly known as:  LIPITOR  Take 1 tablet (20 mg total) by mouth daily at 6 PM.     B-D INS SYRINGE 0.5CC/30GX1/2" 30G X 1/2" 0.5 ML Misc  Generic drug:  Insulin Syringe-Needle U-100  USE AS DIRECTED     carvedilol 12.5 MG tablet  Commonly known as:  COREG  Take 1 tablet (12.5 mg total) by mouth 2 (two) times daily.     famotidine 20 MG tablet  Commonly known as:  PEPCID  Take 20 mg by mouth daily.     ferrous sulfate 325 (65 FE) MG tablet  Take 1 tablet (325 mg total) by mouth 3 (three) times daily with meals.     fluticasone 50 MCG/ACT nasal spray  Commonly known as:  FLONASE  Place 2 sprays into both nostrils daily.     furosemide 40 MG tablet  Commonly known as:  LASIX  Take 40 mg by mouth daily.     insulin aspart 100 UNIT/ML injection  Commonly known as:  novoLOG  - Take novolog per sliding scale:  - CBG 70-120: 0 units   - CBG 121-150: 1 unit   - CBG 151-200: 2 units   - CBG 201-250: 3 units   - CBG 251-300: 5 units   - CBG  301-350: 7 units   - CBG 351-400: 9 units   - CBG > 400: call MD     insulin detemir 100 UNIT/ML injection  Commonly known as:  LEVEMIR  28 units every morning and 20 units every night. diag code 250.00     irbesartan 150 MG tablet  Commonly known as:  AVAPRO  Take 1 tablet (150 mg total) by mouth daily.     Melatonin 3 MG Tabs  Take 1 tablet (3 mg total) by mouth at bedtime as needed.     metFORMIN 1000 MG tablet  Commonly known as:  GLUCOPHAGE  take 1 tablet by mouth twice a day with meals     omeprazole 20 MG capsule  Commonly known as:  PRILOSEC  Take 1 capsule (20 mg total) by mouth daily.     predniSONE 10 MG tablet  Commonly known as:  DELTASONE  Take 5-10 mg by mouth daily with breakfast. Alternate between 5mg  and 10mg        Disposition and follow-up:   MelodyMelody Gray was discharged from [facility] in good condition.  At the hospital follow up visit please address:  1.  Compliance with trach collar (please measure O2 sat while ambulating-->she was dropping her O2 sat on [place] which is why she was instructed to wear her collar during the day); She normally wears collar only night and is on 2L [place] during the day, however, her O2 sats were dropping into the 80s even on 4L [place].  This may have been due to acute illness with increased oxygen requirements but will need to ensure O2 sats are not dropping on her normal 2L.  She may be able to d/c trach collar during the day if her O2 sats are acceptable on 2L but should continue trach collar at night.    2.  Labs / imaging needed at time of follow-up: BMP (ensure SCr back to baseline), Mg  3.  Pending labs/ test needing follow-up: N/A  Follow-up Appointments: Follow-up Information    Follow up with Duwaine Maxin, DO On 08/07/2014.   Specialty:  Internal Medicine   Why:  2:45PM   Contact information:   [place] [address] [phone]       Discharge Instructions: Discharge Instructions      Call MD for:  difficulty breathing, headache or visual disturbances    Complete by:  As directed      Call MD for:  extreme fatigue    Complete by:  As directed      Diet - low sodium heart healthy    Complete by:  As directed      Diet - low sodium heart healthy    Complete by:  As directed      Discharge instructions    Complete by:  As directed   You need to wear the trach collar during the day as well as at night.  You are not getting enough oxygen just from using the nasal cannula.     Increase activity slowly    Complete by:  As directed      Increase activity slowly    Complete by:  As directed            Consultations:  N/A/  Procedures Performed:  X-ray Chest Pa And Lateral  07/28/2014   CLINICAL DATA:  Shortness breath, cough for 2 days.  EXAM: CHEST  2 VIEW  COMPARISON:  05/07/2014  FINDINGS: Bibasilar airspace opacities, likely atelectasis. Heart is borderline in size. No effusions. No acute bony abnormality.  IMPRESSION: Bibasilar opacities, likely atelectasis.   Electronically Signed   By: Rolm Baptise M.D.   On: 07/28/2014 17:59    Admission HPI:  Melody Gray is a 44 yo female with PMHx of Grade 2 Diastolic CHF, Sarcoidosis, T2DM, OSA with tracheostomy, CKD, HTN, GERD, Cardiomyopathy, and HLD who was admitted from the clinic. Patient presented for a routine follow up for diabetes today, but had acute complaints of shortness of breath worsening over the past 2 days. Patient states she noticed increased shortness of breath, productive cough with yellow sputum and increased hand and leg swelling that started 2 days ago. Sputum amount is consistent with a teaspoon each time. She was unable to lie in her bed last night to sleep. She fell asleep in a chair. She normally sleeps on 2 pillows with the head of the bed raised as it is a hospital bed. She states this is not due to shortness of breath but rather because lying flat when she is fluid overloaded causes her discomfort in her  abdomen. She knows she has been gaining weight, but is unsure how much as she does not have a scale at home. She also does not know her dry weight. She has been compliant with all of her medications. She follows with Dr. Stanford Breed who she last saw one month ago. He instructed her to take an extra lasix 40 mg if she gained more than 2 pounds, but she has not done this. She admits to chills, shortness of breath, productive cough, and edema in her hands and lower extremities, but she denies fever, chest pain, sore throat, ear pain, sinus pressure, nausea, vomiting, diarrhea or constipation. She denies sick contacts. She believes her exacerbation is due to the weather and season change as she states her allergies and warmer weather cause this issues for her.   She admits to 1/3 ppd of tobacco, but denies alcohol or illicit drug use. She has been eating and drinking normally.   HPI completed by Osa Craver, [place] [facility] Course by problem list: Principal Problem:   Acute on chronic renal insufficiency Active Problems:   Sarcoidosis   Diabetes mellitus type II, uncontrolled   OBESITY, MORBID   Essential hypertension   Acute on chronic diastolic congestive heart failure   CKD (chronic kidney disease) stage 3, GFR 30-59 ml/min   Acute bronchitis   Acute URI   Acute Viral URI:  Pt admitted from [facility] with worsening dyspnea and LE edema initially thought to be acute diastolic CHF.  Apparently was desaturating to 84% on 2L [place].   CXR showed vascular congestion with patchy airspace disease.  However, BNP only 154.  She c/o URI symptoms including productive cough, nasal congestion and ear fullness. She was diuresed initially with lasix and a subsequent rise in her SCr to 2.5 (baseline ~1.5-1.7).  She was also placed on fluid restriction which may have also exacerbated her AKI.  We relaxed her fluid restriction and her SCr improved by day of discharge.  Dyspnea also improved but she continued to c/o ear  fullness.  Of note, she was instructed to wear her trach collar because she was desaturating to 83% on 4L [place].  She normally wears the collar at night and is on 2L [place] during the day.  She was also given duonebs.  She will need reassessment of her oxygen saturation to see if she can go back to her normal 2L .    Grade 2 Diastolic CHF:  Last echo in June 2015 showed grade 2 diastolic dysfunction and normal EF 60-65%. Patient is on carvedilol 12.5 mg BID, lasix 40 mg daily, irbesartan 150 mg daily at home. Questionable exacerbation as CXR was normal and BNP slightly above normal. Patient developed AKI after 1 day of diuresis but was almost back to baseline upon discharge (1.7)   Acute on Chronic Kidney Disease: SCr elevated after aggressive diuresis and fluid restriction.  Please ensure SCr remains at baseline (1.5-1.7).  Initially held ARB due to AKI but resumed upon d/c.    Sarcoidosis:  Patient is on prednisone daily alternating 5 mg with 10 mg every day.  Continued home dose.   T2DM:  HA1c 9.7 on 07/28/14. Patient is on levemir 28 units in the morning and 20 units at night and SSI Novolog at home.  Continued home meds.   OSA:  Patient is status post tracheostomy 1 year ago after respiratory failure and failure to wean from vent.  Continued.   HTN:  Patient is on amlodipine 5  mg daily, carvedilol 12.5 mg BID, lasix 40 mg daily, and irbesartan 150 mg daily at home.  Held ARB and lasix when AKI developed and resumed upon discharge.    Discharge Vitals:   BP 128/77 mmHg  Pulse 94  Temp(Src) 98.6 F (37 C) (Oral)  Resp 20  Ht 5\' 1"  (1.549 m)  Wt 110.4 kg (243 lb 6.2 oz)  BMI 46.01 kg/m2  SpO2 92%  LMP 07/25/2014  Discharge Labs:  No results found for this or any previous visit (from the past 24 hour(s)).  Signed: Michail Jewels, MD PGY-2, IMTS 08/06/2014 9:16 PM

## 2014-07-29 NOTE — Care Management Note (Signed)
Page 1 of 1   07/31/2014     3:09:30 PM CARE MANAGEMENT NOTE 07/31/2014  Patient:  Melody Gray, Melody Gray   Account Number:  [id number]  Date Initiated:  07/29/2014  Documentation initiated by:  Cathyann Kilfoyle  Subjective/Objective Assessment:   Pt adm on 07/28/14 with CHF exacerbation.  PTA, pt independent, lives with children, and has chronic trach, which she cares for.     Action/Plan:   Will follow for dc needs as pt progresses.   Anticipated DC Date:  08/01/2014   Anticipated DC Plan:  [place]  CM consult      Choice offered to / List presented to:             Status of service:  Completed, signed off Medicare Important Message given?  YES (If response is "NO", the following Medicare IM given date fields will be blank) Date Medicare IM given:  07/31/2014 Medicare IM given by:  Sheina Mcleish Date Additional Medicare IM given:   Additional Medicare IM given by:    Discharge Disposition:  HOME/SELF CARE  Per UR Regulation:  Reviewed for med. necessity/level of care/duration of stay  If discussed at [place] of Stay Meetings, dates discussed:    Comments:  07/31/14 Ellan Lambert, RN, BSN [phone] Met with pt to discuss dc needs.  Pt states she is totally independent with all trach care and ADLS.  She is not receiving any home care, and has supportive family, which she lives with.

## 2014-07-29 NOTE — Progress Notes (Signed)
Subjective:  Patient was seen and examined this morning. She continues to have a cough, but less productive than before. She continues be slightly short of breath above baseline, but improved. She denies any fevers, chill, nausea or vomiting.   Objective: Vital signs in last 24 hours: Filed Vitals:   07/29/14 0240 07/29/14 0611 07/29/14 0931 07/29/14 1013  BP: 106/64 92/44  98/55  Pulse: 99 93 100 84  Temp: 98 F (36.7 C) 98.5 F (36.9 C)    TempSrc: Oral Oral    Resp: 16 16 16 16   Height:      Weight:  249 lb 11.2 oz (113.263 kg)    SpO2: 91% 90% 88% 98%   Weight change:   Intake/Output Summary (Last 24 hours) at 07/29/14 1150 Last data filed at 07/29/14 1013  Gross per 24 hour  Intake    360 ml  Output    700 ml  Net   -340 ml   General: Vital signs reviewed. Patient is well-developed and well-nourished, in no acute distress and cooperative with exam.  Cardiovascular: RRR Pulmonary/Chest: Mild expiratory wheezes and rhonchi. No rales. Abdominal: Soft, non-tender, non-distended, BS +, obese, no guarding present.  Extremities: Trace pitting edema in lower extremities bilaterally  Neurological: A&O x3 Skin: Warm, dry and intact. No rashes or erythema. Psychiatric: Normal mood and affect. speech and behavior is normal. Cognition and memory are normal.   Lab Results: Basic Metabolic Panel:  Recent Labs Lab 07/28/14 1430 07/28/14 1654 07/29/14 0320  NA 137  --  138  K 3.9  --  4.3  CL 97  --  97  CO2 36*  --  34*  GLUCOSE 260*  --  288*  BUN 20  --  25*  CREATININE 1.75*  --  2.16*  CALCIUM 7.9*  --  7.7*  MG  --  1.2*  --    Liver Function Tests:  Recent Labs Lab 07/28/14 1430  AST 21  ALT 14  ALKPHOS 105  BILITOT 1.2  PROT 7.1  ALBUMIN 3.1*   CBC:  Recent Labs Lab 07/28/14 1430 07/28/14 1654 07/29/14 0320  WBC 9.0 9.4 9.1  NEUTROABS 6.2  --   --   HGB 11.9* 12.8 10.8*  HCT 39.4 41.5 36.6  MCV 97.8 96.3 98.9  PLT 183 162 166    CBG:  Recent Labs Lab 07/28/14 1327 07/28/14 2055 07/29/14 0638  GLUCAP 272* 295* 223*   Hemoglobin A1C:  Recent Labs Lab 07/28/14 1337  HGBA1C 9.7   Micro Results: Recent Results (from the past 240 hour(s))  MRSA PCR Screening     Status: None   Collection Time: 07/29/14  3:21 AM  Result Value Ref Range Status   MRSA by PCR NEGATIVE NEGATIVE Final    Comment:        The GeneXpert MRSA Assay (FDA approved for NASAL specimens only), is one component of a comprehensive MRSA colonization surveillance program. It is not intended to diagnose MRSA infection nor to guide or monitor treatment for MRSA infections.    Studies/Results: X-ray Chest Pa And Lateral  07/28/2014   CLINICAL DATA:  Shortness breath, cough for 2 days.  EXAM: CHEST  2 VIEW  COMPARISON:  05/07/2014  FINDINGS: Bibasilar airspace opacities, likely atelectasis. Heart is borderline in size. No effusions. No acute bony abnormality.  IMPRESSION: Bibasilar opacities, likely atelectasis.   Electronically Signed   By: Rolm Baptise M.D.   On: 07/28/2014 17:59   Medications:  I  have reviewed the patient's current medications. Prior to Admission:  Prescriptions prior to admission  Medication Sig Dispense Refill Last Dose  . albuterol (PROVENTIL HFA;VENTOLIN HFA) 108 (90 BASE) MCG/ACT inhaler Inhale 2 puffs into the lungs every 6 (six) hours as needed for wheezing or shortness of breath. 1 Inhaler 2 Past Week at Unknown time  . amLODipine (NORVASC) 5 MG tablet take 1 tablet by mouth once daily 30 tablet 5 07/28/2014 at Unknown time  . atorvastatin (LIPITOR) 20 MG tablet Take 1 tablet (20 mg total) by mouth daily at 6 PM. 90 tablet 3 07/27/2014 at Unknown time  . carvedilol (COREG) 12.5 MG tablet Take 1 tablet (12.5 mg total) by mouth 2 (two) times daily. 180 tablet 3 07/28/2014 at 0900  . DM-Phenylephrine-Acetaminophen (ALKA-SELTZER PLUS DAY COLD/FLU PO) Take 3 mLs by mouth every 6 (six) hours as needed (cold  symptoms).   07/27/2014 at Unknown time  . furosemide (LASIX) 40 MG tablet Take 40 mg by mouth daily.   07/28/2014 at Unknown time  . insulin aspart (NOVOLOG) 100 UNIT/ML injection Take novolog per sliding scale: CBG 70-120: 0 units  CBG 121-150: 1 unit  CBG 151-200: 2 units  CBG 201-250: 3 units  CBG 251-300: 5 units  CBG 301-350: 7 units  CBG 351-400: 9 units  CBG > 400: call MD 30 mL 3 07/28/2014 at Unknown time  . insulin detemir (LEVEMIR) 100 UNIT/ML injection 28 units every morning and 20 units every night. diag code 250.00   07/28/2014 at Unknown time  . irbesartan (AVAPRO) 150 MG tablet Take 1 tablet (150 mg total) by mouth daily. 30 tablet 3 07/28/2014 at Unknown time  . metFORMIN (GLUCOPHAGE) 1000 MG tablet take 1 tablet by mouth twice a day with meals 180 tablet 4 07/28/2014 at Unknown time  . omeprazole (PRILOSEC) 20 MG capsule Take 1 capsule (20 mg total) by mouth daily. 30 capsule 3 07/28/2014 at Unknown time  . predniSONE (DELTASONE) 10 MG tablet Take 5-10 mg by mouth daily with breakfast. Alternate between 5mg  and 10mg    Past Week at Unknown time  . B-D INS SYRINGE 0.5CC/30GX1/2" 30G X 1/2" 0.5 ML MISC USE AS DIRECTED 100 each 1 Taking  . famotidine (PEPCID) 20 MG tablet Take 20 mg by mouth daily.  1 Not Taking at Unknown time  . ferrous sulfate 325 (65 FE) MG tablet Take 1 tablet (325 mg total) by mouth 3 (three) times daily with meals. (Patient not taking: Reported on 07/28/2014) 90 tablet 3 More than a month at Unknown time  . Melatonin 3 MG TABS Take 1 tablet (3 mg total) by mouth at bedtime as needed. (Patient not taking: Reported on 07/28/2014)  0 2 months at Unknown time  . [DISCONTINUED] Dextromethorphan-Guaifenesin (MUCINEX DM MAXIMUM STRENGTH) 60-1200 MG TB12 One every 12 hours as needed for cough   More than a month at Unknown time   Scheduled Meds: . amLODipine  5 mg Oral Daily  . atorvastatin  20 mg Oral q1800  . carvedilol  12.5 mg Oral BID  . furosemide  40 mg Oral  Daily  . heparin  5,000 Units Subcutaneous 3 times per day  . insulin aspart  0-9 Units Subcutaneous TID WC  . insulin detemir  15 Units Subcutaneous BID  . irbesartan  150 mg Oral Daily  . magnesium sulfate 1 - 4 g bolus IVPB  2 g Intravenous Once  . pantoprazole  40 mg Oral Daily  . predniSONE  10 mg  Oral Q48H   And  . [START ON 07/30/2014] predniSONE  5 mg Oral Q48H   Continuous Infusions:  PRN Meds:. Assessment/Plan: Principal Problem:   Acute on chronic diastolic congestive heart failure Active Problems:   Sarcoidosis   Diabetes mellitus type II, uncontrolled   OBESITY, MORBID   Essential hypertension   CKD (chronic kidney disease) stage 3, GFR 30-59 ml/min  Acute Bronchitis: Patient continues to have a productive cough and shortness of breath, but improving. CXR showed bibasilar airspace opacities, likely atelectasis. There were no effusions. She has been afebrile and without leukocytosis. She is satting 90% via her trach on 8L. She is normally on 2L via [place] or trach at home. -Duoneb Q4H prn -Robitussin DM Q4H prn   Mild Grade 2 Diastolic CHF Exacerbation: Resolved. Possibly secondary to viral URI. Patient did have increasing shortness of breath and lower extremity edema, but when diuresed, creatinine bumped from 1.75>2.16. Her lung examination and CXR are not consistent with CHF exacerbation and BNP was mildly elevated but lower than priors (154.7-prior was 341.1 in January). Last echo in June 2015 showed grade 2 diastolic dysfunction and normal EF 60-65%. Patient is on carvedilol 12.5 mg BID, lasix 40 mg daily, irbesartan 150 mg daily at home.  -Continue carvedilol 12.5 mg BID -Transition to home Lasix 40 mg po QD -Continue irbesartan 150 mg daily -Repeat BMET tomorrow am -Telemetry -Renal diet with fluid restriction -Magnesium -Strict I/Os -Daily weights  Acute on Chronic Kidney Disease: Creatinine 1.75>2.1 this morning, likely in the setting of aggressive diuresis.  Unclear baseline, possibly around 1.5.  -Repeat BMET tomorrow morning  Sarcoidosis: Patient is on prednisone daily alternating 5 mg with 10 mg every day. She ran out on Friday and has not taken any since. -Prednisone 5 mg Wednesday Q48 hours -Prednisone 10 mg Thursday Q48 hours  T2DM: HgbA1c 9.7 on 07/28/14. Patient is on levemir 28 units in the morning and 20 units at night and SSI Novolog at home. -CBGs AC/HS -Levemir 15 units BID -SSI-S  OSA: Patient is status post tracheostomy last year for severe sleep apnea. She cleans the trach herself and have it changed next in May. -Oxygen prn  HTN: Low this morning at 92/44>98/55. Patient is on amlodipine 5 mg daily, carvedilol 12.5 mg BID, lasix 40 mg daily, and irbesartan 150 mg daily at home. -Continue amlodipine 5 mg daily -Continue carvedilol 12.5 mg BID -Resume home lasix 40 mg daily -Continue Irbesartan 150 mg daily  GERD: History of GERD. Patient is on omeprazole 20 mg daily. -Protonix 40 mg daily  HLD: Last lipid profile in September 2015 showed cholesterol 238, TG 252, HDL 70, LDL 118. Patient is on atorvastatin 20 mg daily at home. -Continue atorvastatin 20 mg daily  DVT/PE ppx: Heparin SQ TID  CODE: Full  FEN: Renal diet, fluid restriction  Dispo: Disposition is deferred at this time, awaiting improvement of current medical problems.  Anticipated discharge in approximately 1-2 day(s).   The patient does have a current PCP Wilber Oliphant, MD) and does need an [facility] hospital follow-up appointment after discharge.  The patient does not have transportation limitations that hinder transportation to clinic appointments.  .Services Needed at time of discharge: Y = Yes, Blank = No PT:   OT:   RN:   Equipment:   Other:     LOS: 1 day   Osa Craver, DO PGY-1 Internal Medicine Resident Pager # [phone] 07/29/2014 11:50 AM

## 2014-07-30 DIAGNOSIS — J069 Acute upper respiratory infection, unspecified: Secondary | ICD-10-CM

## 2014-07-30 DIAGNOSIS — N189 Chronic kidney disease, unspecified: Secondary | ICD-10-CM

## 2014-07-30 DIAGNOSIS — N289 Disorder of kidney and ureter, unspecified: Secondary | ICD-10-CM

## 2014-07-30 LAB — BASIC METABOLIC PANEL
Anion gap: 11 (ref 5–15)
BUN: 32 mg/dL — ABNORMAL HIGH (ref 6–23)
CO2: 31 mmol/L (ref 19–32)
Calcium: 7.9 mg/dL — ABNORMAL LOW (ref 8.4–10.5)
Chloride: 95 mmol/L — ABNORMAL LOW (ref 96–112)
Creatinine, Ser: 2.5 mg/dL — ABNORMAL HIGH (ref 0.50–1.10)
GFR calc Af Amer: 26 mL/min — ABNORMAL LOW (ref >90)
GFR calc non Af Amer: 22 mL/min — ABNORMAL LOW (ref >90)
Glucose, Bld: 243 mg/dL — ABNORMAL HIGH (ref 70–99)
Potassium: 4.7 mmol/L (ref 3.5–5.1)
Sodium: 137 mmol/L (ref 135–145)

## 2014-07-30 LAB — GLUCOSE, CAPILLARY
Glucose-Capillary: 228 mg/dL — ABNORMAL HIGH (ref 70–99)
Glucose-Capillary: 305 mg/dL — ABNORMAL HIGH (ref 70–99)
Glucose-Capillary: 312 mg/dL — ABNORMAL HIGH (ref 70–99)
Glucose-Capillary: 310 mg/dL — ABNORMAL HIGH (ref 70–99)

## 2014-07-30 MED ORDER — SODIUM CHLORIDE 0.9 % IV SOLN
INTRAVENOUS | Status: DC
  Administered 2014-07-30: 09:00:00 via INTRAVENOUS

## 2014-07-30 MED ORDER — ACETAMINOPHEN 325 MG PO TABS
650.0000 mg | ORAL_TABLET | Freq: Four times a day (QID) | ORAL | Status: DC | PRN
  Administered 2014-07-30 (×2): 650 mg via ORAL
  Filled 2014-07-30 (×2): qty 2

## 2014-07-30 MED ORDER — IPRATROPIUM-ALBUTEROL 0.5-2.5 (3) MG/3ML IN SOLN: 3.0000 mL | Freq: Four times a day (QID) | RESPIRATORY_TRACT | Status: DC | PRN

## 2014-07-30 MED ORDER — FLUTICASONE PROPIONATE 50 MCG/ACT NA SUSP
2.0000 | Freq: Every day | NASAL | Status: DC
  Administered 2014-07-30 – 2014-07-31 (×2): 2 via NASAL
  Filled 2014-07-30: qty 16

## 2014-07-30 MED ORDER — INSULIN DETEMIR 100 UNIT/ML ~~LOC~~ SOLN
20.0000 [IU] | Freq: Two times a day (BID) | SUBCUTANEOUS | Status: DC
  Administered 2014-07-30 – 2014-07-31 (×3): 20 [IU] via SUBCUTANEOUS
  Filled 2014-07-30 (×4): qty 0.2

## 2014-07-30 MED ORDER — IPRATROPIUM-ALBUTEROL 0.5-2.5 (3) MG/3ML IN SOLN: 3.0000 mL | Freq: Four times a day (QID) | RESPIRATORY_TRACT | Status: DC

## 2014-07-30 NOTE — Progress Notes (Signed)
Subjective:  Patient was seen and examined this morning. Patient complains of ear fullness, nasal congestion, and productive cough. Her shortness of breath has improved. She denies fever, chills, diaphoresis, sore throat, itchy watery eyes, nausea, vomiting or diarrhea. She does feel fatigued overall, but states she has been sleeping well.   Objective: Vital signs in last 24 hours: Filed Vitals:   07/30/14 0423 07/30/14 0526 07/30/14 0801 07/30/14 0810  BP:  110/74 111/71   Pulse: 91 89 94 94  Temp:  98.6 F (37 C) 98.5 F (36.9 C)   TempSrc:  Oral Oral   Resp: 18 20 20 18   Height:      Weight:  252 lb 9.6 oz (114.579 kg)    SpO2: 92% 92% 97% 94%   Weight change: 3 lb 9.6 oz (1.633 kg)  Intake/Output Summary (Last 24 hours) at 07/30/14 1010 Last data filed at 07/30/14 0944  Gross per 24 hour  Intake   1200 ml  Output    800 ml  Net    400 ml   General: Vital signs reviewed. Patient is well-developed and well-nourished, in no acute distress and cooperative with exam.  HEENT: Trach with trach collar in place Cardiovascular: RRR Pulmonary/Chest: Diffuse expiratory wheezes and rhonchi. No rales. Abdominal: Soft, non-tender, non-distended, BS + Extremities: Trace pitting edema in lower extremities bilaterally  Neurological: A&O x3 Skin: Warm, dry and intact. No rashes or erythema. Psychiatric: Normal mood and affect. speech and behavior is normal. Cognition and memory are normal.   Lab Results: Basic Metabolic Panel:  Recent Labs Lab 07/28/14 1654 07/29/14 0320 07/30/14 0542  NA  --  138 137  K  --  4.3 4.7  CL  --  97 95*  CO2  --  34* 31  GLUCOSE  --  288* 243*  BUN  --  25* 32*  CREATININE  --  2.16* 2.50*  CALCIUM  --  7.7* 7.9*  MG 1.2*  --   --    Liver Function Tests:  Recent Labs Lab 07/28/14 1430  AST 21  ALT 14  ALKPHOS 105  BILITOT 1.2  PROT 7.1  ALBUMIN 3.1*   CBC:  Recent Labs Lab 07/28/14 1430 07/28/14 1654 07/29/14 0320  WBC  9.0 9.4 9.1  NEUTROABS 6.2  --   --   HGB 11.9* 12.8 10.8*  HCT 39.4 41.5 36.6  MCV 97.8 96.3 98.9  PLT 183 162 166   CBG:  Recent Labs Lab 07/28/14 2055 07/29/14 0638 07/29/14 1122 07/29/14 1615 07/29/14 2048 07/30/14 0536  GLUCAP 295* 223* 324* 341* 338* 228*   Hemoglobin A1C:  Recent Labs Lab 07/28/14 1337  HGBA1C 9.7   Micro Results: Recent Results (from the past 240 hour(s))  MRSA PCR Screening     Status: None   Collection Time: 07/29/14  3:21 AM  Result Value Ref Range Status   MRSA by PCR NEGATIVE NEGATIVE Final    Comment:        The GeneXpert MRSA Assay (FDA approved for NASAL specimens only), is one component of a comprehensive MRSA colonization surveillance program. It is not intended to diagnose MRSA infection nor to guide or monitor treatment for MRSA infections.    Studies/Results: X-ray Chest Pa And Lateral  07/28/2014   CLINICAL DATA:  Shortness breath, cough for 2 days.  EXAM: CHEST  2 VIEW  COMPARISON:  05/07/2014  FINDINGS: Bibasilar airspace opacities, likely atelectasis. Heart is borderline in size. No effusions. No acute bony  abnormality.  IMPRESSION: Bibasilar opacities, likely atelectasis.   Electronically Signed   By: Rolm Baptise M.D.   On: 07/28/2014 17:59   Medications:  I have reviewed the patient's current medications. Prior to Admission:  Prescriptions prior to admission  Medication Sig Dispense Refill Last Dose  . albuterol (PROVENTIL HFA;VENTOLIN HFA) 108 (90 BASE) MCG/ACT inhaler Inhale 2 puffs into the lungs every 6 (six) hours as needed for wheezing or shortness of breath. 1 Inhaler 2 Past Week at Unknown time  . amLODipine (NORVASC) 5 MG tablet take 1 tablet by mouth once daily 30 tablet 5 07/28/2014 at Unknown time  . atorvastatin (LIPITOR) 20 MG tablet Take 1 tablet (20 mg total) by mouth daily at 6 PM. 90 tablet 3 07/27/2014 at Unknown time  . carvedilol (COREG) 12.5 MG tablet Take 1 tablet (12.5 mg total) by mouth 2 (two)  times daily. 180 tablet 3 07/28/2014 at 0900  . DM-Phenylephrine-Acetaminophen (ALKA-SELTZER PLUS DAY COLD/FLU PO) Take 3 mLs by mouth every 6 (six) hours as needed (cold symptoms).   07/27/2014 at Unknown time  . furosemide (LASIX) 40 MG tablet Take 40 mg by mouth daily.   07/28/2014 at Unknown time  . insulin aspart (NOVOLOG) 100 UNIT/ML injection Take novolog per sliding scale: CBG 70-120: 0 units  CBG 121-150: 1 unit  CBG 151-200: 2 units  CBG 201-250: 3 units  CBG 251-300: 5 units  CBG 301-350: 7 units  CBG 351-400: 9 units  CBG > 400: call MD 30 mL 3 07/28/2014 at Unknown time  . insulin detemir (LEVEMIR) 100 UNIT/ML injection 28 units every morning and 20 units every night. diag code 250.00   07/28/2014 at Unknown time  . irbesartan (AVAPRO) 150 MG tablet Take 1 tablet (150 mg total) by mouth daily. 30 tablet 3 07/28/2014 at Unknown time  . metFORMIN (GLUCOPHAGE) 1000 MG tablet take 1 tablet by mouth twice a day with meals 180 tablet 4 07/28/2014 at Unknown time  . omeprazole (PRILOSEC) 20 MG capsule Take 1 capsule (20 mg total) by mouth daily. 30 capsule 3 07/28/2014 at Unknown time  . predniSONE (DELTASONE) 10 MG tablet Take 5-10 mg by mouth daily with breakfast. Alternate between 5mg  and 10mg    Past Week at Unknown time  . B-D INS SYRINGE 0.5CC/30GX1/2" 30G X 1/2" 0.5 ML MISC USE AS DIRECTED 100 each 1 Taking  . famotidine (PEPCID) 20 MG tablet Take 20 mg by mouth daily.  1 Not Taking at Unknown time  . ferrous sulfate 325 (65 FE) MG tablet Take 1 tablet (325 mg total) by mouth 3 (three) times daily with meals. (Patient not taking: Reported on 07/28/2014) 90 tablet 3 More than a month at Unknown time  . Melatonin 3 MG TABS Take 1 tablet (3 mg total) by mouth at bedtime as needed. (Patient not taking: Reported on 07/28/2014)  0 2 months at Unknown time  . [DISCONTINUED] Dextromethorphan-Guaifenesin (MUCINEX DM MAXIMUM STRENGTH) 60-1200 MG TB12 One every 12 hours as needed for cough   More than a  month at Unknown time   Scheduled Meds: . amLODipine  5 mg Oral Daily  . atorvastatin  20 mg Oral q1800  . carvedilol  12.5 mg Oral BID  . fluticasone  2 spray Each Nare Daily  . heparin  5,000 Units Subcutaneous 3 times per day  . insulin aspart  0-9 Units Subcutaneous TID WC  . insulin detemir  20 Units Subcutaneous BID  . pantoprazole  40 mg Oral Daily  .  predniSONE  10 mg Oral Q48H   And  . predniSONE  5 mg Oral Q48H   Continuous Infusions:   PRN Meds:. Assessment/Plan: Principal Problem:   Acute on chronic diastolic congestive heart failure Active Problems:   Sarcoidosis   Diabetes mellitus type II, uncontrolled   OBESITY, MORBID   Essential hypertension   CKD (chronic kidney disease) stage 3, GFR 30-59 ml/min   Acute bronchitis  Acute Viral URI: Patient continues to have a productive cough, nasal congestion and ear fullness. Her shortness of breath is improving. She has been afebrile and without leukocytosis. She is satting low 90s via her trach on 8-10 L. She is normally on 2L via [place] at home. -Transition to 2L  and measure pulse ox to see if patient is at baseline -Duoneb Q6H  -Robitussin DM Q4H prn  -Flonase 2 sprays each nostril daily  Grade 2 Diastolic CHF: Last echo in June 2015 showed grade 2 diastolic dysfunction and normal EF 60-65%. Patient is on carvedilol 12.5 mg BID, lasix 40 mg daily, irbesartan 150 mg daily at home. Questionable exacerbation as CXR was normal and BNP slightly above normal. Patient developed AKI after 1 day of diuresis. Weight is still above baseline 253>249>252, baseline around 243. -Continue carvedilol 12.5 mg BID -HOLD home Lasix 40 mg po QD -HOLD irbesartan 150 mg daily -Repeat BMET tomorrow am -Telemetry -Strict I/Os -Daily weights  Acute on Chronic Kidney Disease: Creatinine 1.75>2.1>2.5 this morning, likely secondary to aggressive diuresis two days ago. Unclear baseline, possibly around 1.5. Patient received home lasix 40 mg  once yesterday.   -Repeat BMET tomorrow morning -HOLD Lasix -HOLD irebesartan -Encourage fluid intake  Sarcoidosis: Patient is on prednisone daily alternating 5 mg with 10 mg every day.  -Prednisone 10 mg Q48 hours -Prednisone 5 mg Q48 hours -Alternate days  T2DM: HgbA1c 9.7 on 07/28/14. Patient is on levemir 28 units in the morning and 20 units at night and SSI Novolog at home. 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 -Oxygen prn  HTN: 110/74 this morning. Patient is on amlodipine 5 mg daily, carvedilol 12.5 mg BID, lasix 40 mg daily, and irbesartan 150 mg daily at home. -Continue amlodipine 5 mg daily -Continue carvedilol 12.5 mg BID -HOLD home lasix 40 mg daily -HOLD Irbesartan 150 mg daily  DVT/PE ppx: Heparin SQ TID  CODE: Full  FEN: Carb Mod  Dispo: Disposition is deferred at this time, awaiting improvement of current medical problems.  Anticipated discharge in approximately 1-2 day(s).   The patient does have a current PCP Wilber Oliphant, MD) and does need an [facility] hospital follow-up appointment after discharge.  The patient does not have transportation limitations that hinder transportation to clinic appointments.  .Services Needed at time of discharge: Y = Yes, Blank = No PT:   OT:   RN:   Equipment:   Other:     LOS: 2 days   Osa Craver, DO PGY-1 Internal Medicine Resident Pager # [phone] 07/30/2014 10:10 AM

## 2014-07-30 NOTE — Trach Care Team (Signed)
Trach Care Progression Note   Patient Details Name: Melody Gray MRN: [id number] DOB: 1970/12/18 Today's Date: 07/30/2014   Tracheostomy Assessment    Tracheostomy [place] 6 mm Uncuffed (Active)  Status Secured;[place] Speaking valve 07/30/2014  1:33 PM  Site Assessment Clean;Dry 07/30/2014 11:06 AM  Site Care Dressing applied 07/28/2014  2:48 PM  Ties Assessment Secure 07/30/2014  1:33 PM  Cuff pressure (cm) 0 cm 07/30/2014  1:33 PM  Emergency Equipment at bedside Yes 07/30/2014  1:33 PM     Care Needs     Respiratory Therapy O2 Device: Tracheostomy Collar FiO2 (%): 40 % SpO2: 92 % Education:  (none needed) Follow up recommendations:  (will follow prn) Respiratory barriers to progression:  (chronic trach, fio2 demand increased)    Speech Language Pathology  SLP chart review complete: Patient does not need SLP services at this time   Physical Therapy      Occupational Therapy      Nutritional Patient's Current Diet: Cardiac    Case Management/Social Work      Provider [place] Team/Provider Recommendations [place] Team Members Present-  Ciro Backer, RT, Alvino Blood, SW,  [name], SLP    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 No needs. Possible discharge tomorrow.         Tonyia Marschall, Jaci Carrel (scribe for team) 07/30/2014, 2:33 PM

## 2014-07-30 NOTE — Progress Notes (Signed)
Patient ID: Melody Gray, female   DOB: [birth year], 44 y.o.   MRN: [id number] Medicine attending: I examined this patient this morning together with resident physician Dr. Osa Craver and I concur with her evaluation and management plan. The patient still has nasal congestion and a low-grade cough without any fever or leukocytosis consistent with a simple URI. She had an exaggerated response to diuretics given on admission and creatinine is still rising despite cessation of diuretic therapy yesterday. Creatinine on admission prior to diuretics was higher than her recent baseline with a normal BUN suggesting progressive renal insufficiency in this known hypertensive diabetic. We are going to liberalize her oral fluid intake and monitor renal function over the next 24 hours before we feel she is stable for discharge.

## 2014-07-30 NOTE — Progress Notes (Signed)
Pt placed on 2l [place] as per md.Pt ues 2l at home. Pt's sats drop especially when lying down but did not rise above 70-80's even when o2 turned up to 5l. Trach collar replaced on pt with sats rising to low 90's

## 2014-07-31 DIAGNOSIS — Z930 Tracheostomy status: Secondary | ICD-10-CM

## 2014-07-31 DIAGNOSIS — Z9981 Dependence on supplemental oxygen: Secondary | ICD-10-CM

## 2014-07-31 DIAGNOSIS — J069 Acute upper respiratory infection, unspecified: Secondary | ICD-10-CM

## 2014-07-31 LAB — BASIC METABOLIC PANEL
Anion gap: 5 (ref 5–15)
BUN: 28 mg/dL — ABNORMAL HIGH (ref 6–23)
CO2: 34 mmol/L — ABNORMAL HIGH (ref 19–32)
Calcium: 8.2 mg/dL — ABNORMAL LOW (ref 8.4–10.5)
Chloride: 97 mmol/L (ref 96–112)
Creatinine, Ser: 1.74 mg/dL — ABNORMAL HIGH (ref 0.50–1.10)
GFR calc Af Amer: 40 mL/min — ABNORMAL LOW (ref >90)
GFR calc non Af Amer: 34 mL/min — ABNORMAL LOW (ref >90)
Glucose, Bld: 253 mg/dL — ABNORMAL HIGH (ref 70–99)
Potassium: 5.3 mmol/L — ABNORMAL HIGH (ref 3.5–5.1)
Sodium: 136 mmol/L (ref 135–145)

## 2014-07-31 LAB — GLUCOSE, CAPILLARY
Glucose-Capillary: 285 mg/dL — ABNORMAL HIGH (ref 70–99)
Glucose-Capillary: 320 mg/dL — ABNORMAL HIGH (ref 70–99)
Glucose-Capillary: 409 mg/dL — ABNORMAL HIGH (ref 70–99)

## 2014-07-31 MED ORDER — INSULIN ASPART 100 UNIT/ML ~~LOC~~ SOLN
5.0000 [IU] | Freq: Once | SUBCUTANEOUS | Status: AC
  Administered 2014-07-31: 5 [IU] via SUBCUTANEOUS

## 2014-07-31 MED ORDER — IPRATROPIUM-ALBUTEROL 0.5-2.5 (3) MG/3ML IN SOLN: 3.0000 mL | Freq: Four times a day (QID) | RESPIRATORY_TRACT | Status: DC

## 2014-07-31 MED ORDER — FLUTICASONE PROPIONATE 50 MCG/ACT NA SUSP: 2.0000 | Freq: Every day | NASAL | Status: DC

## 2014-07-31 NOTE — Progress Notes (Addendum)
Subjective:  Pt reports breathing much better this AM and she would like to go home.  She has been drinking more fluid and SCr has improved.     Objective: Vital signs in last 24 hours:  Filed Vitals:   07/31/14 0753 07/31/14 0926 07/31/14 1445 07/31/14 1703  BP:  122/90 128/77   Pulse: 83 95 91 94  Temp:  98.5 F (36.9 C) 98.6 F (37 C)   TempSrc:  Oral Oral   Resp: 18 18 20 20   Height:      Weight:      SpO2: 94% 96% 92% 92%   Weight change: -4.178 kg (-9 lb 3.4 oz)  Intake/Output Summary (Last 24 hours) at 07/31/14 1800 Last data filed at 07/31/14 1616  Gross per 24 hour  Intake   1000 ml  Output   2776 ml  Net  -1776 ml   General: Vital signs reviewed. Patient is well-developed and well-nourished, in no acute distress and cooperative with exam.  HEENT: Trach with trach collar in place Cardiovascular: RRR Pulmonary/Chest: Diffuse expiratory wheezes, no rhonchi or crackles.   Abdominal: Obese, soft, non-tender BS + Extremities: Trace-1+ pitting edema in LE bilaterally  Neurological: A&O x3 Skin: Warm, dry and intact. No rashes or erythema. Psychiatric: Normal mood and affect. speech and behavior is normal. Cognition and memory are normal.   Lab Results: Basic Metabolic Panel:  Recent Labs Lab 07/28/14 1654  07/30/14 0542 07/31/14 0413  NA  --   < > 137 136  K  --   < > 4.7 5.3*  CL  --   < > 95* 97  CO2  --   < > 31 34*  GLUCOSE  --   < > 243* 253*  BUN  --   < > 32* 28*  CREATININE  --   < > 2.50* 1.74*  CALCIUM  --   < > 7.9* 8.2*  MG 1.2*  --   --   --   < > = values in this interval not displayed. Liver Function Tests:  Recent Labs Lab 07/28/14 1430  AST 21  ALT 14  ALKPHOS 105  BILITOT 1.2  PROT 7.1  ALBUMIN 3.1*   CBC:  Recent Labs Lab 07/28/14 1430 07/28/14 1654 07/29/14 0320  WBC 9.0 9.4 9.1  NEUTROABS 6.2  --   --   HGB 11.9* 12.8 10.8*  HCT 39.4 41.5 36.6  MCV 97.8 96.3 98.9  PLT 183 162 166   CBG:  Recent Labs Lab  07/30/14 1146 07/30/14 1624 07/30/14 2127 07/31/14 0638 07/31/14 1112 07/31/14 1610  GLUCAP 305* 312* 310* 285* 320* 409*   Hemoglobin A1C:  Recent Labs Lab 07/28/14 1337  HGBA1C 9.7   Micro Results: Recent Results (from the past 240 hour(s))  MRSA PCR Screening     Status: None   Collection Time: 07/29/14  3:21 AM  Result Value Ref Range Status   MRSA by PCR NEGATIVE NEGATIVE Final    Comment:        The GeneXpert MRSA Assay (FDA approved for NASAL specimens only), is one component of a comprehensive MRSA colonization surveillance program. It is not intended to diagnose MRSA infection nor to guide or monitor treatment for MRSA infections.    Studies/Results: No results found. Medications:  I have reviewed the patient's current medications. Prior to Admission:  Prescriptions prior to admission  Medication Sig Dispense Refill Last Dose  . albuterol (PROVENTIL HFA;VENTOLIN HFA) 108 (90 BASE) MCG/ACT  inhaler Inhale 2 puffs into the lungs every 6 (six) hours as needed for wheezing or shortness of breath. 1 Inhaler 2 Past Week at Unknown time  . amLODipine (NORVASC) 5 MG tablet take 1 tablet by mouth once daily 30 tablet 5 07/28/2014 at Unknown time  . atorvastatin (LIPITOR) 20 MG tablet Take 1 tablet (20 mg total) by mouth daily at 6 PM. 90 tablet 3 07/27/2014 at Unknown time  . carvedilol (COREG) 12.5 MG tablet Take 1 tablet (12.5 mg total) by mouth 2 (two) times daily. 180 tablet 3 07/28/2014 at 0900  . DM-Phenylephrine-Acetaminophen (ALKA-SELTZER PLUS DAY COLD/FLU PO) Take 3 mLs by mouth every 6 (six) hours as needed (cold symptoms).   07/27/2014 at Unknown time  . furosemide (LASIX) 40 MG tablet Take 40 mg by mouth daily.   07/28/2014 at Unknown time  . insulin aspart (NOVOLOG) 100 UNIT/ML injection Take novolog per sliding scale: CBG 70-120: 0 units  CBG 121-150: 1 unit  CBG 151-200: 2 units  CBG 201-250: 3 units  CBG 251-300: 5 units  CBG 301-350: 7 units  CBG  351-400: 9 units  CBG > 400: call MD 30 mL 3 07/28/2014 at Unknown time  . insulin detemir (LEVEMIR) 100 UNIT/ML injection 28 units every morning and 20 units every night. diag code 250.00   07/28/2014 at Unknown time  . irbesartan (AVAPRO) 150 MG tablet Take 1 tablet (150 mg total) by mouth daily. 30 tablet 3 07/28/2014 at Unknown time  . metFORMIN (GLUCOPHAGE) 1000 MG tablet take 1 tablet by mouth twice a day with meals 180 tablet 4 07/28/2014 at Unknown time  . omeprazole (PRILOSEC) 20 MG capsule Take 1 capsule (20 mg total) by mouth daily. 30 capsule 3 07/28/2014 at Unknown time  . predniSONE (DELTASONE) 10 MG tablet Take 5-10 mg by mouth daily with breakfast. Alternate between 5mg  and 10mg    Past Week at Unknown time  . B-D INS SYRINGE 0.5CC/30GX1/2" 30G X 1/2" 0.5 ML MISC USE AS DIRECTED 100 each 1 Taking  . famotidine (PEPCID) 20 MG tablet Take 20 mg by mouth daily.  1 Not Taking at Unknown time  . ferrous sulfate 325 (65 FE) MG tablet Take 1 tablet (325 mg total) by mouth 3 (three) times daily with meals. (Patient not taking: Reported on 07/28/2014) 90 tablet 3 More than a month at Unknown time  . Melatonin 3 MG TABS Take 1 tablet (3 mg total) by mouth at bedtime as needed. (Patient not taking: Reported on 07/28/2014)  0 2 months at Unknown time  . [DISCONTINUED] Dextromethorphan-Guaifenesin (MUCINEX DM MAXIMUM STRENGTH) 60-1200 MG TB12 One every 12 hours as needed for cough   More than a month at Unknown time   Scheduled Meds: . amLODipine  5 mg Oral Daily  . atorvastatin  20 mg Oral q1800  . carvedilol  12.5 mg Oral BID  . fluticasone  2 spray Each Nare Daily  . heparin  5,000 Units Subcutaneous 3 times per day  . insulin aspart  0-9 Units Subcutaneous TID WC  . insulin detemir  20 Units Subcutaneous BID  . ipratropium-albuterol  3 mL Nebulization Q6H  . pantoprazole  40 mg Oral Daily  . predniSONE  10 mg Oral Q48H   And  . predniSONE  5 mg Oral Q48H   Continuous Infusions:   PRN  Meds:. Assessment/Plan: Principal Problem:   Acute on chronic renal insufficiency Active Problems:   Sarcoidosis   Diabetes mellitus type II, uncontrolled  OBESITY, MORBID   Essential hypertension   Acute on chronic diastolic congestive heart failure   CKD (chronic kidney disease) stage 3, GFR 30-59 ml/min   Acute bronchitis   Acute URI  Acute Viral URI: Resolving.  Patient still has productive cough, nasal congestion and ear fullness. But reports SOB has much improved.  She has been afebrile and without leukocytosis. She is satting low 90s via her trach on 8-10 L. She is normally on 2L via [place] at home.  On 4L with ambulation, she is desatting to the 80s.  I have instructed her that she will likely need to wear the trach collar during the day as well as at night.  Likely d/c today with close f/u next week in the [facility].  -Duoneb Q6H  -Robitussin DM Q4H prn  -Flonase 2 sprays each nostril daily  Grade 2 Diastolic CHF: Last echo in June 2015 showed grade 2 diastolic dysfunction and normal EF 60-65%. Patient is on carvedilol 12.5 mg BID, lasix 40 mg daily, irbesartan 150 mg daily at home. Questionable exacerbation as CXR was normal and BNP slightly above normal. Patient developed AKI after 1 day of diuresis.  -Continue carvedilol 12.5 mg BID -HOLD home Lasix 40 mg po QD -HOLD irbesartan 150 mg daily -Strict I/Os -Daily weights  Acute on Chronic Kidney Disease: Resolving, SCr back to baseline.  Likely secondary to aggressive diuresis two days ago. -HOLD Lasix -HOLD irebesartan -may resume above meds upon d/c  Sarcoidosis: Patient is on prednisone daily alternating 5 mg with 10 mg every day.  -Prednisone 10 mg Q48 hours -Prednisone 5 mg Q48 hours -Alternate days  T2DM: HgbA1c 9.7 on 07/28/14. Patient is on levemir 28 units in the morning and 20 units at night and SSI Novolog at home. -CBGs AC/HS -Levemir 20 units BID -SSI-S -Carb modified diet  OSA: Patient is status post  tracheostomy 1 year ago after respiratory failure and failure to wean from vent. -Oxygen prn  HTN: Patient is on amlodipine 5 mg daily, carvedilol 12.5 mg BID, lasix 40 mg daily, and irbesartan 150 mg daily at home. -Continue amlodipine 5 mg daily -Continue carvedilol 12.5 mg BID -HOLD home lasix 40 mg daily -HOLD Irbesartan 150 mg daily  DVT/PE ppx: Heparin SQ TID  CODE: Full  FEN: Carb Mod  Dispo: Anticipated discharge today.   The patient does have a current PCP Wilber Oliphant, MD) and does need an [facility] hospital follow-up appointment after discharge.   LOS: 3 days  Michail Jewels PGY-2 Internal Medicine Resident 07/31/2014 6:00 PM

## 2014-07-31 NOTE — Plan of Care (Signed)
Problem: Phase I Progression Outcomes Goal: EF % per last Echo/documented,Core Reminder form on chart Outcome: Completed/Met Date Met:  07/31/14 EF60-65%(10-28-2013)

## 2014-07-31 NOTE — Progress Notes (Signed)
Patient seen and examined. Case d/w residents in detail. I agree with findings and plan as outlined in Dr. Ferd Glassing note.    Patient feels better today and would like to go home. She does have episodes of desatting to the 80s on ambulating with nasal cannula. Would maintain trach collar for now with outpatient f/u at [facility].  Patient with AKI on CKD- now improving off lasix and ARB. Will need repeat BMET on follow up.  Patient is stable for d/c home today

## 2014-07-31 NOTE — Progress Notes (Signed)
1800 to keep Trach collar at all times as per Dr.Gill . Pt informed

## 2014-07-31 NOTE — Progress Notes (Signed)
Internal Medicine Clinic Attending  Case discussed with Dr. Eula Fried soon after the resident saw the patient.  We reviewed the resident's history and exam and pertinent patient test results.  I agree with the assessment, diagnosis, and plan of care documented in the resident's note.  Agree with admission based on reported history.

## 2014-07-31 NOTE — Progress Notes (Signed)
Inpatient Diabetes Program Recommendations  AACE/ADA: New Consensus Statement on Inpatient Glycemic Control (2013)  Target Ranges:  Prepandial:   less than 140 mg/dL      Peak postprandial:   less than 180 mg/dL (1-2 hours)      Critically ill patients:  140 - 180 mg/dL   Glucose running consistently in 200's -300's range. Please consider the following: Inpatient Diabetes Program Recommendations Insulin - Basal: Please increase levemir doses to home doses of 28 units in the am and 20 units at HS Correction (SSI): Please increase to moderate tidwc and add HS correction scale  Thank you Rosita Kea, RN, MSN, CDE  Diabetes Inpatient Program Office: [phone] Pager: [phone] 8:00 am to 5:00 pm

## 2014-07-31 NOTE — Progress Notes (Signed)
[place] with Dr Gordy Levan Re::CBG reslt  409 will give orders for insulin coverage

## 2014-07-31 NOTE — Discharge Instructions (Signed)
Please keep your follow-up appointment in the internal medicine clinic with Dr. Redmond Pulling.   You need to wear your trach collar during the day as well as at night.  Your oxygen levels are dropping with the 2L [place] when you walk.       Acute Bronchitis Bronchitis is inflammation of the airways that extend from the windpipe into the lungs (bronchi). The inflammation often causes mucus to develop. This leads to a cough, which is the most common symptom of bronchitis.  In acute bronchitis, the condition usually develops suddenly and goes away over time, usually in a couple weeks. Smoking, allergies, and asthma can make bronchitis worse. Repeated episodes of bronchitis may cause further lung problems.  CAUSES Acute bronchitis is most often caused by the same virus that causes a cold. The virus can spread from person to person (contagious) through coughing, sneezing, and touching contaminated objects. SIGNS AND SYMPTOMS   Cough.   Fever.   Coughing up mucus.   Body aches.   Chest congestion.   Chills.   Shortness of breath.   Sore throat.  DIAGNOSIS  Acute bronchitis is usually diagnosed through a physical exam. Your health care provider will also ask you questions about your medical history. Tests, such as chest X-rays, are sometimes done to rule out other conditions.  TREATMENT  Acute bronchitis usually goes away in a couple weeks. Oftentimes, no medical treatment is necessary. Medicines are sometimes given for relief of fever or cough. Antibiotic medicines are usually not needed but may be prescribed in certain situations. In some cases, an inhaler may be recommended to help reduce shortness of breath and control the cough. A cool mist vaporizer may also be used to help thin bronchial secretions and make it easier to clear the chest.  HOME CARE INSTRUCTIONS  Get plenty of rest.   Drink enough fluids to keep your urine clear or pale yellow (unless you have a medical condition that  requires fluid restriction). Increasing fluids may help thin your respiratory secretions (sputum) and reduce chest congestion, and it will prevent dehydration.   Take medicines only as directed by your health care provider.  If you were prescribed an antibiotic medicine, finish it all even if you start to feel better.  Avoid smoking and secondhand smoke. Exposure to cigarette smoke or irritating chemicals will make bronchitis worse. If you are a smoker, consider using nicotine gum or skin patches to help control withdrawal symptoms. Quitting smoking will help your lungs heal faster.   Reduce the chances of another bout of acute bronchitis by washing your hands frequently, avoiding people with cold symptoms, and trying not to touch your hands to your mouth, nose, or eyes.   Keep all follow-up visits as directed by your health care provider.  SEEK MEDICAL CARE IF: Your symptoms do not improve after 1 week of treatment.  SEEK IMMEDIATE MEDICAL CARE IF:  You develop an increased fever or chills.   You have chest pain.   You have severe shortness of breath.  You have bloody sputum.   You develop dehydration.  You faint or repeatedly feel like you are going to pass out.  You develop repeated vomiting.  You develop a severe headache. MAKE SURE YOU:   Understand these instructions.  Will watch your condition.  Will get help right away if you are not doing well or get worse. Document Released: 05/25/2004 Document Revised: 09/01/2013 Document Reviewed: 10/08/2012 [facility] Patient Information 2015 [place], [place]. This information is not  intended to replace advice given to you by your health care provider. Make sure you discuss any questions you have with your health care provider. ° °

## 2014-07-31 NOTE — Progress Notes (Signed)
SATURATION QUALIFICATIONS: (This note is used to comply with regulatory documentation for home oxygen)  Patient Saturations on Room Air at Rest = 92%  Patient Saturations on Room Air while Ambulating  ON 2 L /M [place] =88  Patient Saturations on 4 Liters of oxygen while Ambulating = 83  Please briefly explain why patient needs home oxygen:

## 2014-08-05 ENCOUNTER — Telehealth: Payer: Self-pay | Admitting: Internal Medicine

## 2014-08-05 NOTE — Telephone Encounter (Signed)
I called Melody Gray this evening to check on her s/p recent hospital admission. She informed me that she is not feeling too great and says that she feels like she has a bad cold. She reports not feeling much improved when she was discharged from the hospital and now maybe she is feeling a little worse. She reports feeling tired at all times, wanting to stay in bed, and sob very easily, even when walking short distances. She tries to turn her oxygen up when needed on ambulation, however, she says then her tank runs out too fast. She is having an intermittent cough and says she is always cold. She does report improvement in her lower extremity edema.   I advised her to come to the hospital ER tonight to get evaluated, however, she refused. I again strongly encouraged her to be evaluated given her complaints, however, she wishes to wait for her clinic visit and says if she gets worse, she will come sooner if needed. I did offer trying to ask the front desk tomorrow if there is any availability for her to be seen in opc sooner than friday, and she did agree to that. Thus, I will send a message to the front desk to call Melody Gray in AM and see if she may be able to be seen tomorrow if any appt's available instead of Friday. Otherwise, she plans to keep her appt on Friday with Dr. Redmond Pulling.   I again recommended for her to go to the ED at this time but she again refused. I will forward this note to the front desk and also Dr. Redmond Pulling.

## 2014-08-07 ENCOUNTER — Ambulatory Visit (HOSPITAL_COMMUNITY)
Admission: RE | Admit: 2014-08-07 | Discharge: 2014-08-07 | Disposition: A | Discharge status: Home / Self Care | Payer: Medicare Other | Source: Ambulatory Visit | Attending: Internal Medicine | Admitting: Internal Medicine

## 2014-08-07 ENCOUNTER — Encounter: Payer: Self-pay | Admitting: Internal Medicine

## 2014-08-07 ENCOUNTER — Ambulatory Visit (INDEPENDENT_AMBULATORY_CARE_PROVIDER_SITE_OTHER): Payer: Medicare Other | Admitting: Internal Medicine

## 2014-08-07 VITALS — BP 138/75 | HR 91 | Temp 98.3°F | Ht 61.0 in | Wt 247.0 lb

## 2014-08-07 DIAGNOSIS — N390 Urinary tract infection, site not specified: Secondary | ICD-10-CM | POA: Diagnosis present

## 2014-08-07 DIAGNOSIS — E11329 Type 2 diabetes mellitus with mild nonproliferative diabetic retinopathy without macular edema: Secondary | ICD-10-CM | POA: Diagnosis present

## 2014-08-07 DIAGNOSIS — E662 Morbid (severe) obesity with alveolar hypoventilation: Secondary | ICD-10-CM | POA: Diagnosis present

## 2014-08-07 DIAGNOSIS — R05 Cough: Secondary | ICD-10-CM

## 2014-08-07 DIAGNOSIS — Z833 Family history of diabetes mellitus: Secondary | ICD-10-CM

## 2014-08-07 DIAGNOSIS — N184 Chronic kidney disease, stage 4 (severe): Secondary | ICD-10-CM | POA: Diagnosis present

## 2014-08-07 DIAGNOSIS — E118 Type 2 diabetes mellitus with unspecified complications: Secondary | ICD-10-CM | POA: Diagnosis not present

## 2014-08-07 DIAGNOSIS — I272 Other secondary pulmonary hypertension: Secondary | ICD-10-CM

## 2014-08-07 DIAGNOSIS — Z6841 Body Mass Index (BMI) 40.0 and over, adult: Secondary | ICD-10-CM

## 2014-08-07 DIAGNOSIS — I5033 Acute on chronic diastolic (congestive) heart failure: Secondary | ICD-10-CM | POA: Diagnosis not present

## 2014-08-07 DIAGNOSIS — J810 Acute pulmonary edema: Secondary | ICD-10-CM | POA: Diagnosis not present

## 2014-08-07 DIAGNOSIS — R059 Cough, unspecified: Secondary | ICD-10-CM

## 2014-08-07 DIAGNOSIS — Z800 Family history of malignant neoplasm of digestive organs: Secondary | ICD-10-CM

## 2014-08-07 DIAGNOSIS — J9621 Acute and chronic respiratory failure with hypoxia: Secondary | ICD-10-CM | POA: Diagnosis not present

## 2014-08-07 DIAGNOSIS — I2729 Other secondary pulmonary hypertension: Principal | ICD-10-CM

## 2014-08-07 DIAGNOSIS — I429 Cardiomyopathy, unspecified: Secondary | ICD-10-CM | POA: Diagnosis present

## 2014-08-07 DIAGNOSIS — Z930 Tracheostomy status: Secondary | ICD-10-CM

## 2014-08-07 DIAGNOSIS — I129 Hypertensive chronic kidney disease with stage 1 through stage 4 chronic kidney disease, or unspecified chronic kidney disease: Secondary | ICD-10-CM | POA: Diagnosis present

## 2014-08-07 DIAGNOSIS — F17210 Nicotine dependence, cigarettes, uncomplicated: Secondary | ICD-10-CM | POA: Diagnosis present

## 2014-08-07 DIAGNOSIS — Z8249 Family history of ischemic heart disease and other diseases of the circulatory system: Secondary | ICD-10-CM

## 2014-08-07 DIAGNOSIS — B9620 Unspecified Escherichia coli [E. coli] as the cause of diseases classified elsewhere: Secondary | ICD-10-CM | POA: Diagnosis present

## 2014-08-07 DIAGNOSIS — Z820 Family history of epilepsy and other diseases of the nervous system: Secondary | ICD-10-CM

## 2014-08-07 DIAGNOSIS — E875 Hyperkalemia: Secondary | ICD-10-CM | POA: Diagnosis present

## 2014-08-07 DIAGNOSIS — N170 Acute kidney failure with tubular necrosis: Secondary | ICD-10-CM | POA: Diagnosis not present

## 2014-08-07 DIAGNOSIS — Z7952 Long term (current) use of systemic steroids: Secondary | ICD-10-CM

## 2014-08-07 DIAGNOSIS — N189 Chronic kidney disease, unspecified: Secondary | ICD-10-CM | POA: Diagnosis not present

## 2014-08-07 DIAGNOSIS — D869 Sarcoidosis, unspecified: Secondary | ICD-10-CM

## 2014-08-07 DIAGNOSIS — E785 Hyperlipidemia, unspecified: Secondary | ICD-10-CM | POA: Diagnosis present

## 2014-08-07 DIAGNOSIS — N179 Acute kidney failure, unspecified: Secondary | ICD-10-CM | POA: Diagnosis not present

## 2014-08-07 DIAGNOSIS — R062 Wheezing: Secondary | ICD-10-CM

## 2014-08-07 DIAGNOSIS — R0602 Shortness of breath: Secondary | ICD-10-CM | POA: Diagnosis not present

## 2014-08-07 DIAGNOSIS — Z794 Long term (current) use of insulin: Secondary | ICD-10-CM

## 2014-08-07 DIAGNOSIS — N289 Disorder of kidney and ureter, unspecified: Secondary | ICD-10-CM

## 2014-08-07 DIAGNOSIS — E113292 Type 2 diabetes mellitus with mild nonproliferative diabetic retinopathy without macular edema, left eye: Secondary | ICD-10-CM

## 2014-08-07 DIAGNOSIS — R0902 Hypoxemia: Secondary | ICD-10-CM | POA: Diagnosis not present

## 2014-08-07 LAB — BASIC METABOLIC PANEL WITH GFR
BUN: 42 mg/dL — ABNORMAL HIGH (ref 6–23)
CO2: 30 mEq/L (ref 19–32)
Calcium: 8.7 mg/dL (ref 8.4–10.5)
Chloride: 96 mEq/L (ref 96–112)
Creat: 2.69 mg/dL — ABNORMAL HIGH (ref 0.50–1.10)
GFR, Est African American: 24 mL/min — ABNORMAL LOW
GFR, Est Non African American: 21 mL/min — ABNORMAL LOW
Glucose, Bld: 233 mg/dL — ABNORMAL HIGH (ref 70–99)
Potassium: 5.8 mEq/L — ABNORMAL HIGH (ref 3.5–5.3)
Sodium: 137 mEq/L (ref 135–145)

## 2014-08-07 LAB — GLUCOSE, CAPILLARY: Glucose-Capillary: 232 mg/dL — ABNORMAL HIGH (ref 70–99)

## 2014-08-07 LAB — HM DIABETES EYE EXAM

## 2014-08-07 LAB — MAGNESIUM: Magnesium: 1.2 mg/dL — ABNORMAL LOW (ref 1.5–2.5)

## 2014-08-07 NOTE — Progress Notes (Signed)
Internal Medicine Clinic Attending  Case discussed with Dr. Wilson at the time of the visit.  We reviewed the resident's history and exam and pertinent patient test results.  I agree with the assessment, diagnosis, and plan of care documented in the resident's note.  

## 2014-08-07 NOTE — Patient Instructions (Signed)
1. I am prescribing you an albuterol nebulizer. Please use your albuterol inhaler until you receive your nebulizer (they have the same medication).  You are likely going to need to use it more often while you are getting over this upper respiratory infection.  I will send you to see Dr. Melvyn Novas - someone will call you with the appointment time.   2. Please take all medications as prescribed.    3. If you have worsening of your symptoms or new symptoms arise, please call the clinic [phone]), or go to the ER immediately if symptoms are severe.

## 2014-08-07 NOTE — Progress Notes (Signed)
Subjective:    Patient ID: Melody Gray, female    DOB: [birth year], 44 y.o.   MRN: [id number]  HPI Comments: Melody Gray is a 44 year old woman with PMH as below here for hospital follow-up after recent admission.  Please see problem based charting for A&P.      Past Medical History  Diagnosis Date  . Sarcoidosis     Followed by Dr. Melvyn Novas; w/ liver involvement per biopsy 12/09, Reversible airway component so started on [facility] 01/2010; HFA 75% p coaching 05/2010  . Hypoxemia     CT angiogram 9/11>> No PE; PFTs 10/11- FEV1 1.20 (49%) with 16% better p B2, DLCO 33%> corrects to 84; O2 sats ok on 4 lpm X rapid walk X 3 laps 05/2010  . Morbid obesity     Target wt= 153 for BMI <30  . QT prolongation   . Hypertension   . Hx of cardiac cath 2/08    No CAD, no RAS,  normal EF  . Seborrheic dermatitis of scalp   . 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 Hep B serology neg. but Hep C ab +, HIV neg. 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 Cardiomyopathy, nonischemic     EF 45% 12/10; Echo 7/11 normal EF, PAS 48  . Diabetic retinopathy     Right eye 2/11  . Health maintenance examination     Mammogram 05/2010 Negative; Last Pap smear 03/2008; Last DM eye exam 2/11> mild non-proliferative diabetic retinopathy. OD  . Helicobacter pylori ab+ 05/2011    Pt was symptomatic and treatment planned for 05/2011  . CHF (congestive heart failure)   . Complication of anesthesia     " difficult waking "  . Sleep apnea   . Diabetes mellitus     insulin dependent  . Acute bronchitis 07/29/2014   Review of Systems  Constitutional: Positive for appetite change. Negative for fever and chills.  HENT: Positive for postnasal drip, rhinorrhea and sneezing. Negative for congestion and sore throat.   Respiratory: Positive for cough and shortness of breath.        Cough productive of clear to yellow mucous.  She denies PND or change in  two pillow orthopnea.   Cardiovascular: Positive for leg swelling.       Leg swelling improved.  Gastrointestinal: Positive for diarrhea and abdominal distention. Negative for nausea, vomiting, abdominal pain, constipation and blood in stool.       Occasional diarrhea she thinks is due metformin.  Genitourinary: Negative for dysuria and hematuria.  Neurological: Positive for light-headedness. Negative for syncope.       Filed Vitals:   08/07/14 1518  BP: 138/75  Pulse: 91  Temp: 98.3 F (36.8 C)  TempSrc: Oral  Height: 5\' 1"  (1.549 m)  Weight: 247 lb (112.038 kg)  SpO2: 94%    Objective:   Physical Exam  Constitutional: She is oriented to person, place, and time. No distress.  HENT:  Head: Normocephalic and atraumatic.  Mouth/Throat: Oropharynx is clear and moist. No oropharyngeal exudate.  No sinus tenderness.  Eyes: EOM are normal. Pupils are equal, round, and reactive to light.  Neck: Neck supple.  +trach collar  Cardiovascular: Normal rate, regular rhythm and normal heart sounds.  Exam reveals no gallop and no friction rub.   No murmur heard. Pulmonary/Chest: Effort normal. No respiratory distress. She has wheezes.  She has no rales.  She is on 3L [place]; +wheezes in the right lung field that resolved after albuterol neb trx  Abdominal: Soft. Bowel sounds are normal. She exhibits no distension and no mass. There is no tenderness. There is no rebound and no guarding.  Musculoskeletal: Normal range of motion. She exhibits edema. She exhibits no tenderness.  1+ B/L pitting edema  Neurological: She is alert and oriented to person, place, and time. No cranial nerve deficit.  Skin: Skin is warm. She is not diaphoretic.  Psychiatric: She has a normal mood and affect. Her behavior is normal. Judgment and thought content normal.  Vitals reviewed.         Assessment & Plan:  Please see problem based assessment and plan.

## 2014-08-08 ENCOUNTER — Emergency Department (HOSPITAL_COMMUNITY): Payer: Medicare Other

## 2014-08-08 ENCOUNTER — Inpatient Hospital Stay (HOSPITAL_COMMUNITY)
Admission: EM | Admit: 2014-08-08 | Discharge: 2014-08-16 | DRG: 286 | Disposition: A | Discharge status: Home Health | Payer: Medicare Other | Attending: Internal Medicine | Admitting: Internal Medicine

## 2014-08-08 ENCOUNTER — Other Ambulatory Visit: Payer: Self-pay

## 2014-08-08 ENCOUNTER — Encounter (HOSPITAL_COMMUNITY): Payer: Self-pay | Admitting: Emergency Medicine

## 2014-08-08 DIAGNOSIS — R0602 Shortness of breath: Secondary | ICD-10-CM | POA: Diagnosis not present

## 2014-08-08 DIAGNOSIS — R1030 Lower abdominal pain, unspecified: Secondary | ICD-10-CM

## 2014-08-08 DIAGNOSIS — N170 Acute kidney failure with tubular necrosis: Secondary | ICD-10-CM | POA: Insufficient documentation

## 2014-08-08 DIAGNOSIS — J810 Acute pulmonary edema: Secondary | ICD-10-CM | POA: Insufficient documentation

## 2014-08-08 DIAGNOSIS — F17210 Nicotine dependence, cigarettes, uncomplicated: Secondary | ICD-10-CM | POA: Diagnosis not present

## 2014-08-08 DIAGNOSIS — I5033 Acute on chronic diastolic (congestive) heart failure: Secondary | ICD-10-CM | POA: Diagnosis present

## 2014-08-08 DIAGNOSIS — N179 Acute kidney failure, unspecified: Secondary | ICD-10-CM

## 2014-08-08 DIAGNOSIS — R0902 Hypoxemia: Secondary | ICD-10-CM | POA: Diagnosis not present

## 2014-08-08 DIAGNOSIS — N19 Unspecified kidney failure: Secondary | ICD-10-CM

## 2014-08-08 NOTE — ED Notes (Signed)
Pt has been short of breath for two weeks-- worse yesterday, went to dr-- no improvement. Pt has trach-- on home O2-- states sats on nasal cannula are around 80%-- placed on 4 l/m/[place] at triage -- sats between 80-83%-- resp therapy notified for trach collar-- pt states is supposed to wear O2 constantly on trach, but doesn't -- "feel like I have too much fluid"

## 2014-08-08 NOTE — ED Notes (Signed)
Pt placed on venti mask -- 6L/m per resp therapy.

## 2014-08-08 NOTE — ED Provider Notes (Signed)
CSN: [id number]     Arrival date & time 08/08/14  2235 History   First MD Initiated Contact with Patient 08/08/14 2301     Chief Complaint  Patient presents with  . Shortness of Breath  . hypoxic    Patient is a 44 y.o. female presenting with shortness of breath. The history is provided by the patient. No language interpreter was used.  Shortness of Breath Associated symptoms: no abdominal pain, no chest pain, no cough, no fever, no headaches, no neck pain, no rash, no vomiting and no wheezing     This chart was scribed for Julianne Rice, MD by Thea Alken, ED Scribe. This patient was seen in room A07C/A07C and the patient's care was started at 1:31 AM.  HPI Comments:  Melody Gray is a 44 y.o. female with hx of sarcoidosis, hypoxemia, HTN and CHF who present to the Emergency Department complaining of worsening SOB for the past 2 weeks. Pt was seen by PCP 1 day ago but has had worsening symptoms since visit. She reports worsening SOB with lying flat on her back. She reports associated leg swelling and abdominal tightness. Pt uses 2L of [place] with oxygen during the day and she usually sleeps with a trach collar but has not been using it lately.   Past Medical History  Diagnosis Date  . Sarcoidosis     Followed by Dr. Melvyn Novas; w/ liver involvement per biopsy 12/09, Reversible airway component so started on [facility] 01/2010; HFA 75% p coaching 05/2010  . Hypoxemia     CT angiogram 9/11>> No PE; PFTs 10/11- FEV1 1.20 (49%) with 16% better p B2, DLCO 33%> corrects to 84; O2 sats ok on 4 lpm X rapid walk X 3 laps 05/2010  . Morbid obesity     Target wt= 153 for BMI <30  . QT prolongation   . Hypertension   . Hx of cardiac cath 2/08    No CAD, no RAS,  normal EF  . Seborrheic dermatitis of scalp   . 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 Hep B serology neg. but Hep C ab +, HIV neg. 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 Cardiomyopathy, nonischemic     EF 45% 12/10; Echo 7/11 normal EF, PAS 48  . Diabetic retinopathy     Right eye 2/11  . Health maintenance examination     Mammogram 05/2010 Negative; Last Pap smear 03/2008; Last DM eye exam 2/11> mild non-proliferative diabetic retinopathy. OD  . Helicobacter pylori ab+ 05/2011    Pt was symptomatic and treatment planned for 05/2011  . CHF (congestive heart failure)   . Complication of anesthesia     " difficult waking "  . Sleep apnea   . Diabetes mellitus     insulin dependent  . Acute bronchitis 07/29/2014   Past Surgical History  Procedure Laterality Date  . Tubal ligation    . Breast surgery    . Cesarean section    . Tracheostomy tube placement N/A 08/10/2013    Procedure: TRACHEOSTOMY;  Surgeon: Melissa Montane, MD;  Location: [facility] OR;  Service: ENT;  Laterality: N/A;   Family History  Problem Relation Age of Onset  . Cancer Mother     colon cancer  . Multiple sclerosis Father   . Asthma Sister     in childhood  . Diabetes Father   .  Hypertension     History  Substance Use Topics  . Smoking status: Current Every Day Smoker -- 0.25 packs/day for 20 years    Types: Cigarettes  . Smokeless tobacco: Never Used     Comment: Needs to cut back.  . Alcohol Use: No   OB History    No data available     Review of Systems  Constitutional: Negative for fever and chills.  Respiratory: Positive for shortness of breath. Negative for cough and wheezing.   Cardiovascular: Positive for leg swelling. Negative for chest pain.  Gastrointestinal: Positive for abdominal distention. Negative for nausea, vomiting, abdominal pain and diarrhea.  Musculoskeletal: Negative for back pain, neck pain and neck stiffness.  Skin: Negative for rash and wound.  Neurological: Negative for dizziness, weakness, light-headedness, numbness and headaches.  All other systems reviewed and are negative.  Allergies  Vicodin  Home Medications   Prior to Admission  medications   Medication Sig Start Date End Date Taking? Authorizing Provider  albuterol (PROVENTIL HFA;VENTOLIN HFA) 108 (90 BASE) MCG/ACT inhaler Inhale 2 puffs into the lungs every 6 (six) hours as needed for wheezing or shortness of breath. 11/26/13  Yes Marijean Heath, NP  amLODipine (NORVASC) 5 MG tablet take 1 tablet by mouth once daily 05/19/14  Yes Wilber Oliphant, MD  atorvastatin (LIPITOR) 20 MG tablet Take 1 tablet (20 mg total) by mouth daily at 6 PM. 05/21/14  Yes Lucious Groves, DO  carvedilol (COREG) 12.5 MG tablet Take 1 tablet (12.5 mg total) by mouth 2 (two) times daily. 02/05/14  Yes Lelon Perla, MD  famotidine (PEPCID) 20 MG tablet Take 20 mg by mouth daily. 07/18/14  Yes Historical Provider, MD  furosemide (LASIX) 40 MG tablet Take 40 mg by mouth daily. 11/26/13 11/26/14 Yes Marijean Heath, NP  insulin aspart (NOVOLOG) 100 UNIT/ML injection Take novolog per sliding scale: CBG 70-120: 0 units  CBG 121-150: 1 unit  CBG 151-200: 2 units  CBG 201-250: 3 units  CBG 251-300: 5 units  CBG 301-350: 7 units  CBG 351-400: 9 units  CBG > 400: call MD 12/08/13  Yes Wilber Oliphant, MD  insulin detemir (LEVEMIR) 100 UNIT/ML injection 28 units every morning and 20 units every night. diag code 250.00 12/01/13  Yes Wilber Oliphant, MD  irbesartan (AVAPRO) 150 MG tablet Take 1 tablet (150 mg total) by mouth daily. 05/09/14  Yes Norman Herrlich, MD  Melatonin 3 MG TABS Take 1 tablet (3 mg total) by mouth at bedtime as needed. 11/26/13  Yes Marijean Heath, NP  metFORMIN (GLUCOPHAGE) 1000 MG tablet take 1 tablet by mouth twice a day with meals 06/16/14  Yes Wilber Oliphant, MD  B-D INS SYRINGE 0.5CC/30GX1/2" 30G X 1/2" 0.5 ML MISC USE AS DIRECTED 05/19/14   Wilber Oliphant, MD  ferrous sulfate 325 (65 FE) MG tablet Take 1 tablet (325 mg total) by mouth 3 (three) times daily with meals. Patient not taking: Reported on 07/28/2014 05/21/14   Lucious Groves, DO  fluticasone [facility])  50 MCG/ACT nasal spray Place 2 sprays into both nostrils daily. Patient not taking: Reported on 08/07/2014 07/31/14   Jones Bales, MD   BP 102/58 mmHg  Pulse 89  Temp(Src) 98.5 F (36.9 C) (Oral)  Resp 22  Wt 247 lb (112.038 kg)  SpO2 97%  LMP 07/25/2014 Physical Exam  Constitutional: She is oriented to person, place, and time. She appears well-developed and well-nourished. No distress.  HENT:  Head: Normocephalic and atraumatic.  Mouth/Throat: Oropharynx is clear and moist.  Eyes: EOM are normal. Pupils are equal, round, and reactive to light.  Neck: Normal range of motion. Neck supple.  Trach in place  Cardiovascular: Normal rate and regular rhythm.   Pulmonary/Chest: Effort normal. No respiratory distress. She has no wheezes. She has no rales.  Diminished breath sounds bilateral bases  Abdominal: Soft. Bowel sounds are normal. She exhibits distension. She exhibits no mass. There is no tenderness. There is no rebound and no guarding.  Musculoskeletal: Normal range of motion. She exhibits edema. She exhibits no tenderness.  2+ bilateral lower extremity pitting edema  Neurological: She is alert and oriented to person, place, and time.  Moves all extremities without deficit. Sensation grossly intact.  Skin: Skin is warm and dry. No rash noted. No erythema.  Psychiatric: She has a normal mood and affect. Her behavior is normal.  Nursing note and vitals reviewed.   ED Course  Procedures (including critical care time)  Labs Review Labs Reviewed  CBC WITH DIFFERENTIAL/PLATELET - Abnormal; Notable for the following:    Hemoglobin 11.5 (*)    RDW 15.8 (*)    All other components within normal limits  COMPREHENSIVE METABOLIC PANEL - Abnormal; Notable for the following:    Glucose, Bld 241 (*)    BUN 52 (*)    Creatinine, Ser 3.34 (*)    Calcium 8.2 (*)    Albumin 3.3 (*)    GFR calc non Af Amer 16 (*)    GFR calc Af Amer 18 (*)    All other components within normal limits   BRAIN NATRIURETIC PEPTIDE - Abnormal; Notable for the following:    B Natriuretic Peptide 853.4 (*)    All other components within normal limits  TROPONIN I    Imaging Review Dg Chest 2 View  08/08/2014   CLINICAL DATA:  Cough and wheezing for 8 days.  History of sarcoid.  EXAM: CHEST  2 VIEW  COMPARISON:  07/28/2014 and prior chest radiographs dating back to 10/05/2010.  FINDINGS: Cardiomegaly and a tracheostomy tube again noted.  The cardiomediastinal silhouette is unchanged.  Interstitial opacities bilaterally and mild peribronchial thickening are unchanged.  There is no evidence of focal airspace disease, pulmonary edema, suspicious pulmonary nodule/mass, pleural effusion, or pneumothorax. No acute bony abnormalities are identified.  IMPRESSION: No evidence of acute cardiopulmonary disease.  Cardiomegaly and chronic bilateral interstitial opacities.   Electronically Signed   By: Margarette Canada M.D.   On: 08/08/2014 09:19   Dg Chest Port 1 View  08/09/2014   CLINICAL DATA:  Shortness of breath worsening since 08/07/2014. History of sarcoidosis, hypertension, CHF.  EXAM: PORTABLE CHEST - 1 VIEW  COMPARISON:  08/07/2014  FINDINGS: Tracheostomy. Mild cardiac enlargement. Interval development of increasing perihilar vascularity and infiltration in the lung bases suggesting developing edema or pneumonia. No blunting of costophrenic angles. No new pneumothorax. Degenerative changes in the shoulders.  IMPRESSION: Increasing perihilar vascularity and infiltration in the lung bases suggesting developing edema or pneumonia.   Electronically Signed   By: Lucienne Capers M.D.   On: 08/09/2014 00:09     EKG Interpretation   Date/Time:  Saturday August 08 2014 22:41:58 EDT Ventricular Rate:  97 PR Interval:  132 QRS Duration: 84 QT Interval:  360 QTC Calculation: 457 R Axis:   -36 Text Interpretation:  Normal sinus rhythm Left axis deviation Anterior  infarct , age undetermined Abnormal ECG Confirmed by  Lita Mains  MD, Gretel Cantu  ([id number]) on 08/08/2014 11:41:15 PM      MDM   Final diagnoses:  SOB (shortness of breath)  Hypoxia  Acute pulmonary edema      Patient with improved oxygenation on trach collar. BNP is elevated. Chest x-ray consistent with pulmonary edema. Given IV Lasix in the emergency department. Discussed with internal medicine resident and will see patient in the emergency department and admit.  Julianne Rice, MD 08/09/14 [phone]

## 2014-08-09 ENCOUNTER — Encounter (HOSPITAL_COMMUNITY): Payer: Self-pay | Admitting: General Practice

## 2014-08-09 ENCOUNTER — Inpatient Hospital Stay (HOSPITAL_COMMUNITY): Payer: Medicare Other

## 2014-08-09 DIAGNOSIS — I5033 Acute on chronic diastolic (congestive) heart failure: Secondary | ICD-10-CM | POA: Diagnosis not present

## 2014-08-09 DIAGNOSIS — Z833 Family history of diabetes mellitus: Secondary | ICD-10-CM | POA: Diagnosis not present

## 2014-08-09 DIAGNOSIS — E11329 Type 2 diabetes mellitus with mild nonproliferative diabetic retinopathy without macular edema: Secondary | ICD-10-CM | POA: Diagnosis present

## 2014-08-09 DIAGNOSIS — I120 Hypertensive chronic kidney disease with stage 5 chronic kidney disease or end stage renal disease: Secondary | ICD-10-CM | POA: Diagnosis not present

## 2014-08-09 DIAGNOSIS — E875 Hyperkalemia: Secondary | ICD-10-CM | POA: Diagnosis not present

## 2014-08-09 DIAGNOSIS — I129 Hypertensive chronic kidney disease with stage 1 through stage 4 chronic kidney disease, or unspecified chronic kidney disease: Secondary | ICD-10-CM

## 2014-08-09 DIAGNOSIS — Z930 Tracheostomy status: Secondary | ICD-10-CM | POA: Diagnosis not present

## 2014-08-09 DIAGNOSIS — E662 Morbid (severe) obesity with alveolar hypoventilation: Secondary | ICD-10-CM | POA: Diagnosis present

## 2014-08-09 DIAGNOSIS — Z800 Family history of malignant neoplasm of digestive organs: Secondary | ICD-10-CM | POA: Diagnosis not present

## 2014-08-09 DIAGNOSIS — Z7952 Long term (current) use of systemic steroids: Secondary | ICD-10-CM | POA: Diagnosis not present

## 2014-08-09 DIAGNOSIS — N189 Chronic kidney disease, unspecified: Secondary | ICD-10-CM

## 2014-08-09 DIAGNOSIS — Z794 Long term (current) use of insulin: Secondary | ICD-10-CM | POA: Diagnosis not present

## 2014-08-09 DIAGNOSIS — I429 Cardiomyopathy, unspecified: Secondary | ICD-10-CM | POA: Diagnosis present

## 2014-08-09 DIAGNOSIS — D869 Sarcoidosis, unspecified: Secondary | ICD-10-CM | POA: Diagnosis not present

## 2014-08-09 DIAGNOSIS — J810 Acute pulmonary edema: Secondary | ICD-10-CM | POA: Diagnosis not present

## 2014-08-09 DIAGNOSIS — F17210 Nicotine dependence, cigarettes, uncomplicated: Secondary | ICD-10-CM | POA: Diagnosis present

## 2014-08-09 DIAGNOSIS — I272 Other secondary pulmonary hypertension: Secondary | ICD-10-CM | POA: Diagnosis not present

## 2014-08-09 DIAGNOSIS — J9621 Acute and chronic respiratory failure with hypoxia: Secondary | ICD-10-CM | POA: Diagnosis not present

## 2014-08-09 DIAGNOSIS — J9601 Acute respiratory failure with hypoxia: Secondary | ICD-10-CM | POA: Diagnosis not present

## 2014-08-09 DIAGNOSIS — N170 Acute kidney failure with tubular necrosis: Secondary | ICD-10-CM | POA: Diagnosis not present

## 2014-08-09 DIAGNOSIS — N184 Chronic kidney disease, stage 4 (severe): Secondary | ICD-10-CM | POA: Diagnosis present

## 2014-08-09 DIAGNOSIS — E1129 Type 2 diabetes mellitus with other diabetic kidney complication: Secondary | ICD-10-CM | POA: Diagnosis not present

## 2014-08-09 DIAGNOSIS — R1030 Lower abdominal pain, unspecified: Secondary | ICD-10-CM | POA: Diagnosis not present

## 2014-08-09 DIAGNOSIS — E785 Hyperlipidemia, unspecified: Secondary | ICD-10-CM | POA: Diagnosis present

## 2014-08-09 DIAGNOSIS — I270 Primary pulmonary hypertension: Secondary | ICD-10-CM | POA: Diagnosis not present

## 2014-08-09 DIAGNOSIS — Z8249 Family history of ischemic heart disease and other diseases of the circulatory system: Secondary | ICD-10-CM | POA: Diagnosis not present

## 2014-08-09 DIAGNOSIS — I509 Heart failure, unspecified: Secondary | ICD-10-CM | POA: Diagnosis not present

## 2014-08-09 DIAGNOSIS — R0602 Shortness of breath: Secondary | ICD-10-CM | POA: Diagnosis not present

## 2014-08-09 DIAGNOSIS — N390 Urinary tract infection, site not specified: Secondary | ICD-10-CM | POA: Diagnosis not present

## 2014-08-09 DIAGNOSIS — N179 Acute kidney failure, unspecified: Secondary | ICD-10-CM | POA: Diagnosis not present

## 2014-08-09 DIAGNOSIS — B9620 Unspecified Escherichia coli [E. coli] as the cause of diseases classified elsewhere: Secondary | ICD-10-CM | POA: Diagnosis present

## 2014-08-09 DIAGNOSIS — Z6841 Body Mass Index (BMI) 40.0 and over, adult: Secondary | ICD-10-CM | POA: Diagnosis not present

## 2014-08-09 DIAGNOSIS — Z820 Family history of epilepsy and other diseases of the nervous system: Secondary | ICD-10-CM | POA: Diagnosis not present

## 2014-08-09 LAB — CBC
HCT: 37 % (ref 36.0–46.0)
Hemoglobin: 10.9 g/dL — ABNORMAL LOW (ref 12.0–15.0)
MCH: 29.1 pg (ref 26.0–34.0)
MCHC: 29.5 g/dL — ABNORMAL LOW (ref 30.0–36.0)
MCV: 98.9 fL (ref 78.0–100.0)
Platelets: 256 10*3/uL (ref 150–400)
RBC: 3.74 MIL/uL — ABNORMAL LOW (ref 3.87–5.11)
RDW: 15.8 % — ABNORMAL HIGH (ref 11.5–15.5)
WBC: 10.7 10*3/uL — ABNORMAL HIGH (ref 4.0–10.5)

## 2014-08-09 LAB — URINALYSIS, ROUTINE W REFLEX MICROSCOPIC
Glucose, UA: NEGATIVE mg/dL
Ketones, ur: NEGATIVE mg/dL
Nitrite: POSITIVE — AB
Protein, ur: 100 mg/dL — AB
Specific Gravity, Urine: 1.019 (ref 1.005–1.030)
Urobilinogen, UA: 1 mg/dL (ref 0.0–1.0)
pH: 5 (ref 5.0–8.0)

## 2014-08-09 LAB — COMPREHENSIVE METABOLIC PANEL
ALT: 15 U/L (ref 0–35)
AST: 18 U/L (ref 0–37)
Albumin: 3.3 g/dL — ABNORMAL LOW (ref 3.5–5.2)
Alkaline Phosphatase: 87 U/L (ref 39–117)
Anion gap: 13 (ref 5–15)
BUN: 52 mg/dL — ABNORMAL HIGH (ref 6–23)
CO2: 26 mmol/L (ref 19–32)
Calcium: 8.2 mg/dL — ABNORMAL LOW (ref 8.4–10.5)
Chloride: 98 mmol/L (ref 96–112)
Creatinine, Ser: 3.34 mg/dL — ABNORMAL HIGH (ref 0.50–1.10)
GFR calc Af Amer: 18 mL/min — ABNORMAL LOW (ref >90)
GFR calc non Af Amer: 16 mL/min — ABNORMAL LOW (ref >90)
Glucose, Bld: 241 mg/dL — ABNORMAL HIGH (ref 70–99)
Potassium: 4.7 mmol/L (ref 3.5–5.1)
Sodium: 137 mmol/L (ref 135–145)
Total Bilirubin: 0.9 mg/dL (ref 0.3–1.2)
Total Protein: 7.1 g/dL (ref 6.0–8.3)

## 2014-08-09 LAB — CBC WITH DIFFERENTIAL/PLATELET
Basophils Absolute: 0 10*3/uL (ref 0.0–0.1)
Basophils Relative: 0 % (ref 0–1)
Eosinophils Absolute: 0.1 10*3/uL (ref 0.0–0.7)
Eosinophils Relative: 1 % (ref 0–5)
HCT: 38.2 % (ref 36.0–46.0)
Hemoglobin: 11.5 g/dL — ABNORMAL LOW (ref 12.0–15.0)
Lymphocytes Relative: 22 % (ref 12–46)
Lymphs Abs: 2.3 10*3/uL (ref 0.7–4.0)
MCH: 29.3 pg (ref 26.0–34.0)
MCHC: 30.1 g/dL (ref 30.0–36.0)
MCV: 97.2 fL (ref 78.0–100.0)
Monocytes Absolute: 0.8 10*3/uL (ref 0.1–1.0)
Monocytes Relative: 7 % (ref 3–12)
Neutro Abs: 7.1 10*3/uL (ref 1.7–7.7)
Neutrophils Relative %: 70 % (ref 43–77)
Platelets: 268 10*3/uL (ref 150–400)
RBC: 3.93 MIL/uL (ref 3.87–5.11)
RDW: 15.8 % — ABNORMAL HIGH (ref 11.5–15.5)
WBC: 10.4 10*3/uL (ref 4.0–10.5)

## 2014-08-09 LAB — BASIC METABOLIC PANEL
Anion gap: 17 — ABNORMAL HIGH (ref 5–15)
BUN: 55 mg/dL — ABNORMAL HIGH (ref 6–23)
CO2: 23 mmol/L (ref 19–32)
Calcium: 7.9 mg/dL — ABNORMAL LOW (ref 8.4–10.5)
Chloride: 96 mmol/L (ref 96–112)
Creatinine, Ser: 3.63 mg/dL — ABNORMAL HIGH (ref 0.50–1.10)
GFR calc Af Amer: 16 mL/min — ABNORMAL LOW (ref >90)
GFR calc non Af Amer: 14 mL/min — ABNORMAL LOW (ref >90)
Glucose, Bld: 226 mg/dL — ABNORMAL HIGH (ref 70–99)
Potassium: 5 mmol/L (ref 3.5–5.1)
Sodium: 136 mmol/L (ref 135–145)

## 2014-08-09 LAB — GLUCOSE, CAPILLARY
Glucose-Capillary: 225 mg/dL — ABNORMAL HIGH (ref 70–99)
Glucose-Capillary: 227 mg/dL — ABNORMAL HIGH (ref 70–99)
Glucose-Capillary: 252 mg/dL — ABNORMAL HIGH (ref 70–99)
Glucose-Capillary: 186 mg/dL — ABNORMAL HIGH (ref 70–99)

## 2014-08-09 LAB — URINE MICROSCOPIC-ADD ON

## 2014-08-09 LAB — BRAIN NATRIURETIC PEPTIDE: B Natriuretic Peptide: 853.4 pg/mL — ABNORMAL HIGH (ref 0.0–100.0)

## 2014-08-09 LAB — CREATININE, URINE, RANDOM: Creatinine, Urine: 287.08 mg/dL

## 2014-08-09 LAB — TROPONIN I: Troponin I: <0.03 ng/mL (ref <0.031)

## 2014-08-09 LAB — SODIUM, URINE, RANDOM: Sodium, Ur: 47 mmol/L

## 2014-08-09 LAB — MAGNESIUM: Magnesium: 1.4 mg/dL — ABNORMAL LOW (ref 1.5–2.5)

## 2014-08-09 MED ORDER — PREDNISONE 10 MG PO TABS
10.0000 mg | ORAL_TABLET | ORAL | Status: DC
  Filled 2014-08-09: qty 1

## 2014-08-09 MED ORDER — FERROUS SULFATE 325 (65 FE) MG PO TABS
325.0000 mg | ORAL_TABLET | Freq: Every day | ORAL | Status: DC
  Administered 2014-08-10: 325 mg via ORAL
  Filled 2014-08-09 (×2): qty 1

## 2014-08-09 MED ORDER — ATORVASTATIN CALCIUM 20 MG PO TABS
20.0000 mg | ORAL_TABLET | Freq: Every day | ORAL | Status: DC
  Administered 2014-08-09 – 2014-08-15 (×6): 20 mg via ORAL
  Filled 2014-08-09 (×8): qty 1

## 2014-08-09 MED ORDER — INSULIN ASPART 100 UNIT/ML ~~LOC~~ SOLN: 0.0000 [IU] | Freq: Every day | SUBCUTANEOUS | Status: DC

## 2014-08-09 MED ORDER — SODIUM CHLORIDE 0.9 % IJ SOLN
3.0000 mL | Freq: Two times a day (BID) | INTRAMUSCULAR | Status: DC
Start: 2014-08-09 — End: 2014-08-16
  Administered 2014-08-09 – 2014-08-16 (×11): 3 mL via INTRAVENOUS

## 2014-08-09 MED ORDER — PREDNISONE 20 MG PO TABS
20.0000 mg | ORAL_TABLET | ORAL | Status: DC
  Administered 2014-08-10: 20 mg via ORAL
  Filled 2014-08-09: qty 1

## 2014-08-09 MED ORDER — MAGNESIUM CHLORIDE 64 MG PO TBEC: 1.0000 | DELAYED_RELEASE_TABLET | Freq: Once | ORAL | Status: DC

## 2014-08-09 MED ORDER — PREDNISONE 5 MG PO TABS: 5.0000 mg | ORAL_TABLET | Freq: Every day | ORAL | Status: DC

## 2014-08-09 MED ORDER — DOCUSATE SODIUM 100 MG PO CAPS
100.0000 mg | ORAL_CAPSULE | Freq: Two times a day (BID) | ORAL | Status: DC
  Administered 2014-08-10 – 2014-08-16 (×10): 100 mg via ORAL
  Filled 2014-08-09 (×15): qty 1

## 2014-08-09 MED ORDER — PREDNISONE 5 MG PO TABS
5.0000 mg | ORAL_TABLET | ORAL | Status: DC
  Administered 2014-08-09: 5 mg via ORAL
  Filled 2014-08-09: qty 1

## 2014-08-09 MED ORDER — INSULIN ASPART 100 UNIT/ML ~~LOC~~ SOLN
0.0000 [IU] | Freq: Three times a day (TID) | SUBCUTANEOUS | Status: DC
  Administered 2014-08-09: 5 [IU] via SUBCUTANEOUS

## 2014-08-09 MED ORDER — IPRATROPIUM-ALBUTEROL 0.5-2.5 (3) MG/3ML IN SOLN
3.0000 mL | Freq: Four times a day (QID) | RESPIRATORY_TRACT | Status: DC
  Administered 2014-08-09 – 2014-08-16 (×24): 3 mL via RESPIRATORY_TRACT
  Filled 2014-08-09 (×26): qty 3

## 2014-08-09 MED ORDER — INSULIN DETEMIR 100 UNIT/ML ~~LOC~~ SOLN
16.0000 [IU] | Freq: Two times a day (BID) | SUBCUTANEOUS | Status: DC
  Administered 2014-08-09 – 2014-08-10 (×3): 16 [IU] via SUBCUTANEOUS
  Filled 2014-08-09 (×5): qty 0.16

## 2014-08-09 MED ORDER — INSULIN ASPART 100 UNIT/ML ~~LOC~~ SOLN
0.0000 [IU] | Freq: Three times a day (TID) | SUBCUTANEOUS | Status: DC
  Administered 2014-08-09: 8 [IU] via SUBCUTANEOUS
  Administered 2014-08-10: 5 [IU] via SUBCUTANEOUS
  Administered 2014-08-10: 8 [IU] via SUBCUTANEOUS
  Administered 2014-08-10: 11 [IU] via SUBCUTANEOUS

## 2014-08-09 MED ORDER — MAGNESIUM CHLORIDE 64 MG PO TBEC
2.0000 | DELAYED_RELEASE_TABLET | Freq: Once | ORAL | Status: AC
  Administered 2014-08-09: 128 mg via ORAL
  Filled 2014-08-09: qty 2

## 2014-08-09 MED ORDER — SODIUM CHLORIDE 0.9 % IV SOLN: INTRAVENOUS | Status: DC

## 2014-08-09 MED ORDER — SODIUM CHLORIDE 0.9 % IV SOLN
INTRAVENOUS | Status: DC
  Administered 2014-08-09: 18:00:00 via INTRAVENOUS

## 2014-08-09 MED ORDER — FUROSEMIDE 10 MG/ML IJ SOLN
80.0000 mg | Freq: Once | INTRAMUSCULAR | Status: AC
  Administered 2014-08-09: 80 mg via INTRAVENOUS
  Filled 2014-08-09: qty 8

## 2014-08-09 MED ORDER — CALCIUM CARBONATE-VITAMIN D 500-200 MG-UNIT PO TABS
1.0000 | ORAL_TABLET | Freq: Two times a day (BID) | ORAL | Status: DC
  Administered 2014-08-10 (×2): 1 via ORAL
  Filled 2014-08-09 (×3): qty 1

## 2014-08-09 MED ORDER — CEFTRIAXONE SODIUM IN DEXTROSE 40 MG/ML IV SOLN
2.0000 g | INTRAVENOUS | Status: DC
  Administered 2014-08-09 – 2014-08-13 (×4): 2 g via INTRAVENOUS
  Filled 2014-08-09 (×5): qty 50

## 2014-08-09 MED ORDER — HEPARIN SODIUM (PORCINE) 5000 UNIT/ML IJ SOLN
5000.0000 [IU] | Freq: Three times a day (TID) | INTRAMUSCULAR | Status: DC
  Administered 2014-08-09 – 2014-08-12 (×12): 5000 [IU] via SUBCUTANEOUS
  Filled 2014-08-09 (×13): qty 1

## 2014-08-09 MED ORDER — FAMOTIDINE 20 MG PO TABS
20.0000 mg | ORAL_TABLET | Freq: Every day | ORAL | Status: DC
  Administered 2014-08-10 – 2014-08-16 (×7): 20 mg via ORAL
  Filled 2014-08-09 (×7): qty 1

## 2014-08-09 MED ORDER — BUDESONIDE 0.25 MG/2ML IN SUSP
0.2500 mg | Freq: Two times a day (BID) | RESPIRATORY_TRACT | Status: DC
  Administered 2014-08-09 – 2014-08-15 (×12): 0.25 mg via RESPIRATORY_TRACT
  Filled 2014-08-09 (×17): qty 2

## 2014-08-09 NOTE — Assessment & Plan Note (Addendum)
Her continued DOE could be due to worsening pulmonary HTN in the setting of OHS and pulmonary sarcoid vs acute HF.  She says her leg edema has improved (1+ pitting edema B/L today) and she denies PND and has stable two pillow orthopnea making me less suspicious for decompensated HF (last ECHO June 2015 with EF 60-65%).  She does say her abdomen feels tight and she has early satiety but I cannot appreciate distention on today's exam.  She has not been using trach collar (due to discomfort).   She is using 2-3L [place].  She has not been using prn albuterol because she says it is not near her (though she does have it in her home).  She says she has not received the nebulizer that was ordered at discharge.  Wheezing resolved after albuterol neb in clinic and SpO2 was mid 90s on 2L at rest.  She was in NAD and said she felt better.  She continues to desat to mid 80s with ambulation (after neb).   - advised patient use her prn albuterol inhaler for now until she receives the nebulizer supplies - re-order neb machine - continue alternate day dosing prednisone - continue Lasix - repeat CXR to r/o PNA - repeat ECHO to evaluate function and PA pressure - refer patient back to her pulmonologist for follow-up; if pulmonology follow-up not available within the next two weeks will have her back to clinic in two weeks for interval follow-up until she can be seen by pulm

## 2014-08-09 NOTE — Progress Notes (Signed)
Subjective:    Currently, the patient reports that her breathing is stable. She continues have very little urine output. She denies any pain or discomfort currently.  Interval Events: -Vital signs stable overnight.  -Only put out 100 mL urine last night.    Objective:    Vital Signs:   Temp:  [98 F (36.7 C)-98.5 F (36.9 C)] 98 F (36.7 C) (04/10 0314) Pulse Rate:  [86-95] 87 (04/10 0837) Resp:  [16-23] 18 (04/10 0837) BP: (91-150)/(48-127) 110/70 mmHg (04/10 0314) SpO2:  [81 %-98 %] 98 % (04/10 1220) FiO2 (%):  [40 %-60 %] 40 % (04/10 1220) Weight:  [247 lb (112.038 kg)-247 lb 3.2 oz (112.129 kg)] 247 lb 3.2 oz (112.129 kg) (04/10 0314) Last BM Date: 08/08/14  24-hour weight change: Weight change:   Intake/Output:   Intake/Output Summary (Last 24 hours) at 08/09/14 1429 Last data filed at 08/09/14 1100  Gross per 24 hour  Intake    550 ml  Output    100 ml  Net    450 ml      Physical Exam: General: Well-developed, well-nourished, in no acute distress, appropriate and cooperative throughout examination.  Trach collar in place.  Lungs:  Normal work of breathing, slightly decreased air movement.  Heart: RRR. S1 and S2 normal without gallop, murmur, or rubs.  Abdomen:  BS normoactive. Soft, Nondistended, non-tender.  No masses or organomegaly.  Extremities: 1+ pitting edema bilaterally.     Labs:  Basic Metabolic Panel:  Recent Labs Lab 08/07/14 1653 08/08/14 2323 08/09/14 0540  NA 137 137 136  K 5.8* 4.7 5.0  CL 96 98 96  CO2 30 26 23   GLUCOSE 233* 241* 226*  BUN 42* 52* 55*  CREATININE 2.69* 3.34* 3.63*  CALCIUM 8.7 8.2* 7.9*  MG 1.2*  --   --     Liver Function Tests:  Recent Labs Lab 08/08/14 2323  AST 18  ALT 15  ALKPHOS 87  BILITOT 0.9  PROT 7.1  ALBUMIN 3.3*   CBC:  Recent Labs Lab 08/08/14 2323 08/09/14 0540  WBC 10.4 10.7*  NEUTROABS 7.1  --   HGB 11.5* 10.9*  HCT 38.2 37.0  MCV 97.2 98.9  PLT 268 256    Cardiac  Enzymes:  Recent Labs Lab 08/08/14 2323  TROPONINI <0.03   CBG:  Recent Labs Lab 08/07/14 1528 08/09/14 0616 08/09/14 1143  GLUCAP 232* 225* 227*    Microbiology: Results for orders placed or performed during the hospital encounter of 07/28/14  MRSA PCR Screening     Status: None   Collection Time: 07/29/14  3:21 AM  Result Value Ref Range Status   MRSA by PCR NEGATIVE NEGATIVE Final    Comment:        The GeneXpert MRSA Assay (FDA approved for NASAL specimens only), is one component of a comprehensive MRSA colonization surveillance program. It is not intended to diagnose MRSA infection nor to guide or monitor treatment for MRSA infections.    Other results: EKG: Normal sinus rhythm, left axis deviation, possible left atrial enlargement, T-wave inversion in aVR, unchanged from prior.  Imaging: X-ray Chest Pa And Lateral  08/09/2014   CLINICAL DATA:  44 year old female with acute on chronic diastolic congestive heart failure. Shortness of Breath. Initial encounter.  EXAM: CHEST  2 VIEW  COMPARISON:  08/08/2014 and earlier.  FINDINGS: Lower lung volumes. Stable cardiomegaly and mediastinal contours. Tracheostomy remains in place. Mildly increased interstitial and streaky mid and lower lung opacity.  No pneumothorax. No definite pleural effusion. Stable visualized osseous structures.  IMPRESSION: Streaky and interstitial bilateral mid and lower lung opacity has mildly progressed. Differential considerations include increased atelectasis, mild interstitial edema, or less likely bilateral pneumonia. No pleural effusion identified.   Electronically Signed   By: Genevie Ann M.D.   On: 08/09/2014 09:39   Dg Chest 2 View  08/08/2014   CLINICAL DATA:  Cough and wheezing for 8 days.  History of sarcoid.  EXAM: CHEST  2 VIEW  COMPARISON:  07/28/2014 and prior chest radiographs dating back to 10/05/2010.  FINDINGS: Cardiomegaly and a tracheostomy tube again noted.  The cardiomediastinal  silhouette is unchanged.  Interstitial opacities bilaterally and mild peribronchial thickening are unchanged.  There is no evidence of focal airspace disease, pulmonary edema, suspicious pulmonary nodule/mass, pleural effusion, or pneumothorax. No acute bony abnormalities are identified.  IMPRESSION: No evidence of acute cardiopulmonary disease.  Cardiomegaly and chronic bilateral interstitial opacities.   Electronically Signed   By: Margarette Canada M.D.   On: 08/08/2014 09:19   Dg Chest Port 1 View  08/09/2014   CLINICAL DATA:  Shortness of breath worsening since 08/07/2014. History of sarcoidosis, hypertension, CHF.  EXAM: PORTABLE CHEST - 1 VIEW  COMPARISON:  08/07/2014  FINDINGS: Tracheostomy. Mild cardiac enlargement. Interval development of increasing perihilar vascularity and infiltration in the lung bases suggesting developing edema or pneumonia. No blunting of costophrenic angles. No new pneumothorax. Degenerative changes in the shoulders.  IMPRESSION: Increasing perihilar vascularity and infiltration in the lung bases suggesting developing edema or pneumonia.   Electronically Signed   By: Lucienne Capers M.D.   On: 08/09/2014 00:09       Medications:    Infusions:    Scheduled Medications: . atorvastatin  20 mg Oral q1800  . heparin  5,000 Units Subcutaneous 3 times per day  . insulin aspart  0-5 Units Subcutaneous QHS  . insulin aspart  0-9 Units Subcutaneous TID WC  . insulin detemir  16 Units Subcutaneous BID  . predniSONE  5 mg Oral Q48H   And  . [START ON 08/10/2014] predniSONE  10 mg Oral Q48H  . sodium chloride  3 mL Intravenous Q12H    PRN Medications:    Assessment/ Plan:    Active Problems:   Acute on chronic diastolic CHF (congestive heart failure)  #Acute on chronic kidney disease Creatinine elevated to 3.63 this morning from 3.34 yesterday. Possibly cardiorenal. Creatinine was measured at 1.08 in January 2016. Was elevated during last admission due to diuresis.  Unclear cause per nephrology, but thought of possible volume depletion. -Consult nephrology. -Follow-up urine urea and creatinine. -Urinalysis. -Follow-up renal ultrasound. -Card modified diet. -Normal saline at 65 mL per hour per nephrology. -Foley catheter per nephrology. -Recheck creatinine in the morning.  #Acute hypoxic respiratory failure due to decompensated congestive heart failure Increased vascularity and infiltration on chest x-ray. Poor urine output on Lasix. She does have some pitting edema as well. BNP is elevated from prior. Just grade 2 diastolic dysfunction on previous echo. A lot of her dyspnea is likely related to her obesity hypoventilation syndrome and poor tracheostomy use. -We'll hold off on additional Lasix for now due to concern for volume depletion. -Continue strict ins and outs. -Daily weights. -Follow-up repeat echocardiogram.  #Sarcoidosis -Continue home prednisone alternating 5 and 10 mg daily.  #Hypertension Patient remains normotensive off of home BP meds. Continue to hold BP meds due to AKI. -Continue to hold home Coreg 12.5 mg twice a  day, amlodipine 5 mg daily, Lasix 40 mg daily, and irbesartan 150 mg daily.  #Type 2 diabetes Blood sugars sightly elevated on current regimen. -Continue Levemir 16 units twice a day. -Increase sliding scale insulin to moderate before meals at bedtime.  #Obesity hypoventilation syndrome -Continue oxygen supplement vision via trach collar as needed and keep oxygen saturations greater than 92%.  #Hyperlipidemia -Continue home atorvastatin 20 mg daily.   DVT PPX - heparin  CODE STATUS - Full.  CONSULTS PLACED - Nephrology.  DISPO - Disposition is deferred at this time, awaiting improvement of AKI.   Anticipated discharge in approximately 2-4 day(s).   The patient does have a current PCP Jerene Pitch, MD) and does need an [facility] hospital follow-up appointment after discharge.    Is the [facility] hospital follow-up  appointment a one-time only appointment? no.  Does the patient have transportation limitations that hinder transportation to clinic appointments? yes   SERVICE NEEDED AT [place]         Y = Yes, Blank = No PT:   OT:   RN:   Equipment:   Other:      Length of Stay: 0 day(s)   Signed: Charlesetta Shanks, MD  PGY-1, Internal Medicine Resident Pager: [phone] (7AM-5PM) 08/09/2014, 2:29 PM

## 2014-08-09 NOTE — Progress Notes (Signed)
Utilization review completed.  

## 2014-08-09 NOTE — ED Notes (Signed)
Attempted report 

## 2014-08-09 NOTE — ED Notes (Signed)
Gave pt ice water, per Janett Billow - RN.

## 2014-08-09 NOTE — Consult Note (Signed)
Renal Service Consult Note [facility]  Melody Gray 08/09/2014 [place] Requesting Physician:  Dr Marinda Elk  Reason for Consult:  Acute on CRF HPI: The patient is a 44 y.o. year-old with hx of HTN, DM2 and sardoidosis, obesity , recurrent AKI who presented to the ED with SOB x 2 wks. Has trach in place on home O2.  Denies any fevers, prod cough, CP.  No abd pian, n/v/d. Creat on admission was 2.69 and today is 3.63.  Also says she stopped making urine about 2 days ago.  No n/v/d, takes 40 mg lasix only , no abd pain recently.  No recent med changes or CT scans. No NSAID's.    BP's here are soft 90's - 110 range.  Has made urine about 100 cc today.     Chart review: 2001 - IUP, preeclampsia 2005 - new onset DM, HTN, breast abcess 2008 - sarcoid flare/ dyspnea, HTN, borderline DM2 2010 - GI/liver (+bx)/ pulm sarcoidosis, DM d/t steroids, HTN 2010 - CHF, pulm/ hepatic sarcoid, neph syndrome, DM2 2011 - pulm sarcoid flare rx w steroids 2011 - SOB likely sarcoid flare 2013 - VGE, AKI, n/v/d 2013 - epistaxis, OHS, sarcoid; on vent, chronic resp failure. Epistaxis rx with embolization 3/15 - CAP, DM, CFH, PAH, sarcoid 4/15 - trach, a/c resp failure, OHS/ sarcoid, PAH, a/c dCHF 6/15 - a/c resp failure, pulm edema, chronic trach, vol excess - rx w diuresis, vent support 1/16 - a/c CHF, CKD 3, O2 dependent, chronic trach 3/16 - acute viral URI, sarcoid, DM2, morbid obesity, HTN, diast FH, a/c renal failure   Past Medical History  Past Medical History  Diagnosis Date  . Sarcoidosis     Followed by Dr. Melvyn Novas; w/ liver involvement per biopsy 12/09, Reversible airway component so started on [facility] 01/2010; HFA 75% p coaching 05/2010  . Hypoxemia     CT angiogram 9/11>> No PE; PFTs 10/11- FEV1 1.20 (49%) with 16% better p B2, DLCO 33%> corrects to 84; O2 sats ok on 4 lpm X rapid walk X 3 laps 05/2010  . Morbid obesity     Target wt= 153 for BMI <30  . QT prolongation   .  Hypertension   . Hx of cardiac cath 2/08    No CAD, no RAS,  normal EF  . Seborrheic dermatitis of scalp   . 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 Hep B serology neg. but Hep C ab +, HIV neg. 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 Cardiomyopathy, nonischemic     EF 45% 12/10; Echo 7/11 normal EF, PAS 48  . Diabetic retinopathy     Right eye 2/11  . Health maintenance examination     Mammogram 05/2010 Negative; Last Pap smear 03/2008; Last DM eye exam 2/11> mild non-proliferative diabetic retinopathy. OD  . Helicobacter pylori ab+ 05/2011    Pt was symptomatic and treatment planned for 05/2011  . CHF (congestive heart failure)   . Complication of anesthesia     " difficult waking "  . Sleep apnea   . Diabetes mellitus     insulin dependent  . Acute bronchitis 07/29/2014   Past Surgical History  Past Surgical History  Procedure Laterality Date  . Tubal ligation    . Breast surgery    . Cesarean section    . Tracheostomy tube placement N/A 08/10/2013  Procedure: TRACHEOSTOMY;  Surgeon: Melissa Montane, MD;  Location: [facility] OR;  Service: ENT;  Laterality: N/A;   Family History  Family History  Problem Relation Age of Onset  . Cancer Mother     colon cancer  . Multiple sclerosis Father   . Asthma Sister     in childhood  . Diabetes Father   . Hypertension     Social History  reports that she has been smoking Cigarettes.  She has a 5 pack-year smoking history. She has never used smokeless tobacco. She reports that she does not drink alcohol or use illicit drugs. Allergies  Allergies  Allergen Reactions  . Vicodin [Hydrocodone-Acetaminophen] Itching   Home medications Prior to Admission medications   Medication Sig Start Date End Date Taking? Authorizing Provider  albuterol (PROVENTIL HFA;VENTOLIN HFA) 108 (90 BASE) MCG/ACT inhaler Inhale 2 puffs into the lungs every 6 (six) hours as needed for  wheezing or shortness of breath. 11/26/13  Yes Marijean Heath, NP  amLODipine (NORVASC) 5 MG tablet take 1 tablet by mouth once daily 05/19/14  Yes Wilber Oliphant, MD  atorvastatin (LIPITOR) 20 MG tablet Take 1 tablet (20 mg total) by mouth daily at 6 PM. 05/21/14  Yes Lucious Groves, DO  carvedilol (COREG) 12.5 MG tablet Take 1 tablet (12.5 mg total) by mouth 2 (two) times daily. 02/05/14  Yes Lelon Perla, MD  famotidine (PEPCID) 20 MG tablet Take 20 mg by mouth daily. 07/18/14  Yes Historical Provider, MD  furosemide (LASIX) 40 MG tablet Take 40 mg by mouth daily. 11/26/13 11/26/14 Yes Marijean Heath, NP  insulin aspart (NOVOLOG) 100 UNIT/ML injection Take novolog per sliding scale: CBG 70-120: 0 units  CBG 121-150: 1 unit  CBG 151-200: 2 units  CBG 201-250: 3 units  CBG 251-300: 5 units  CBG 301-350: 7 units  CBG 351-400: 9 units  CBG > 400: call MD 12/08/13  Yes Wilber Oliphant, MD  insulin detemir (LEVEMIR) 100 UNIT/ML injection 28 units every morning and 20 units every night. diag code 250.00 12/01/13  Yes Wilber Oliphant, MD  irbesartan (AVAPRO) 150 MG tablet Take 1 tablet (150 mg total) by mouth daily. 05/09/14  Yes Norman Herrlich, MD  Melatonin 3 MG TABS Take 1 tablet (3 mg total) by mouth at bedtime as needed. 11/26/13  Yes Marijean Heath, NP  metFORMIN (GLUCOPHAGE) 1000 MG tablet take 1 tablet by mouth twice a day with meals 06/16/14  Yes Wilber Oliphant, MD  B-D INS SYRINGE 0.5CC/30GX1/2" 30G X 1/2" 0.5 ML MISC USE AS DIRECTED 05/19/14   Wilber Oliphant, MD  ferrous sulfate 325 (65 FE) MG tablet Take 1 tablet (325 mg total) by mouth 3 (three) times daily with meals. Patient not taking: Reported on 07/28/2014 05/21/14   Lucious Groves, DO  fluticasone [facility]) 50 MCG/ACT nasal spray Place 2 sprays into both nostrils daily. Patient not taking: Reported on 08/07/2014 07/31/14   Jones Bales, MD   Liver Function Tests  Recent Labs Lab 08/08/14 2323  AST 18  ALT 15   ALKPHOS 87  BILITOT 0.9  PROT 7.1  ALBUMIN 3.3*   No results for input(s): LIPASE, AMYLASE in the last 168 hours. CBC  Recent Labs Lab 08/08/14 2323 08/09/14 0540  WBC 10.4 10.7*  NEUTROABS 7.1  --   HGB 11.5* 10.9*  HCT 38.2 37.0  MCV 97.2 98.9  PLT 268 161   Basic Metabolic Panel  Recent Labs  Lab 08/07/14 1653 08/08/14 2323 08/09/14 0540  NA 137 137 136  K 5.8* 4.7 5.0  CL 96 98 96  CO2 '30 26 23  ' GLUCOSE 233* 241* 226*  BUN 42* 52* 55*  CREATININE 2.69* 3.34* 3.63*  CALCIUM 8.7 8.2* 7.9*    Filed Vitals:   08/09/14 0314 08/09/14 0325 08/09/14 0837 08/09/14 1220  BP: 110/70     Pulse: 90 87 87   Temp: 98 F (36.7 C)     TempSrc: Oral     Resp: '18 18 18   ' Height: '5\' 1"'  (1.549 m)     Weight: 112.129 kg (247 lb 3.2 oz)     SpO2: 94% 94% 95% 98%   Exam Adult AAF lying flat on back, no distress, trach in place w O2 setup No jvd Chest is clear bilat RRR no MRG Abd obese, soft NTND, no abd wall ascites or HSM GU deferred Ext show 1-2+ pitting edema pretib bilat, no other edema Neuro is awake and alert, no distress, moves all extremities   UA - pending CXR - "streaky and interstitial bilateral mid and lower lung opacity has mildly progressed" ECHO Jun '15 - LVEF 60-65%, gr 2 diast dysfxn, RV pressure and vol overload. RV mildly dilated and at least mildly hypokinetic  Creat   Date   eGFR 4/15   3.5 > 0.79  7/15  0.73 - 1.56  35 - 80 9/15   1.26   48 11/15  1.29   49 Jan 2016 1.08 - 1.80  33 - 61 Mar 2016 1.75 - 2.50  22 - 34 08/07/14  2.69   08/08/14  3.34 08/09/14 3.63   14    Assessment: 1. Acute on CRF 3/4 - unclear cause.  BP's are soft, may have hypoperfusion. Was on Avapro at home. She's at her usual weight w only pretib edema, lungs clear. Try cautious IVF's in case of intravascular vol depletion.  Foley cath, UNa and UCr.  UA. Will follow 2. 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 Sarcoidosis / chronic resp failure - has  chronic trach, on chronic steroids 4. Morbid obesity 5. DM2 6. HTN - takes norvasc, coreg and Avapro at home, nothing here.    Plan- cautious IVF"s, recheck Creat in am.  UNa, UCr, UA.    Kelly Splinter MD (pgr) [phone]    [phone] 08/09/2014, 1:52 PM

## 2014-08-09 NOTE — Assessment & Plan Note (Signed)
Checking BMP and Mg today.

## 2014-08-09 NOTE — Progress Notes (Signed)
Pt admitted from ED with SOB x 2 weeks and CHF exacerbation, pt a/o, no c/o pain, pt has # 6 [place], pt has continuous pulse ox as ordered, O2 sats WNL, no c/o SOB, pt stable

## 2014-08-09 NOTE — H&P (Signed)
Date: 08/09/2014               Patient Name:  Melody Gray MRN: [id number]  DOB: 08-09-1970 Age / Sex: 44 y.o., female   PCP: Wilber Oliphant, MD         Medical Service: Internal Medicine Teaching Service         Attending Physician: Dr. Bertha Stakes, MD    First Contact: Dr. Genene Churn Pager: [phone]  Second Contact: Dr. Naaman Plummer Pager: [phone]       After Hours (After 5p/  First Contact Pager: [phone]  weekends / holidays): Second Contact Pager: [phone]   Chief Complaint: Shortness of Breath and hypoxic    History of Present Illness:   Patient is a 44 year old female with a history of congestive heart failure, chronic kidney disease, sarcoidosis, hypertension, type 2 diabetes, obesity hypoventilation syndrome (with tracheostomy) who presents to the hospital for dyspnea. Patient states that she was seen in clinic 3 days prior to presentation on 08/07/2014 for progressive dyspnea, peripheral edema, and orthopnea. Patient was advised to continue taking her furosemide but has noticed that since that appointment, she has not had a significant amount of urine output. Patient denies any associated chest pain, productive cough, fever, unilateral leg swelling. She denies any significant changes in her diet. She notes that during the same time period, her amount of urine output has significantly reduced. Patient states that she has not voided within the 24 hours preceding her presentation to the hospital. Patient states that she has had a trach collar since April 2014. This was placed in the setting of obesity hyperventilation syndrome and patient has been unable to wean off of it since.  Meds: Medications Prior to Admission  Medication Sig Dispense Refill  . albuterol (PROVENTIL HFA;VENTOLIN HFA) 108 (90 BASE) MCG/ACT inhaler Inhale 2 puffs into the lungs every 6 (six) hours as needed for wheezing or shortness of breath. 1 Inhaler 2  . amLODipine (NORVASC) 5 MG tablet take 1 tablet by mouth  once daily 30 tablet 5  . atorvastatin (LIPITOR) 20 MG tablet Take 1 tablet (20 mg total) by mouth daily at 6 PM. 90 tablet 3  . carvedilol (COREG) 12.5 MG tablet Take 1 tablet (12.5 mg total) by mouth 2 (two) times daily. 180 tablet 3  . famotidine (PEPCID) 20 MG tablet Take 20 mg by mouth daily.  1  . furosemide (LASIX) 40 MG tablet Take 40 mg by mouth daily.    . insulin aspart (NOVOLOG) 100 UNIT/ML injection Take novolog per sliding scale: CBG 70-120: 0 units  CBG 121-150: 1 unit  CBG 151-200: 2 units  CBG 201-250: 3 units  CBG 251-300: 5 units  CBG 301-350: 7 units  CBG 351-400: 9 units  CBG > 400: call MD 30 mL 3  . insulin detemir (LEVEMIR) 100 UNIT/ML injection 28 units every morning and 20 units every night. diag code 250.00    . irbesartan (AVAPRO) 150 MG tablet Take 1 tablet (150 mg total) by mouth daily. 30 tablet 3  . Melatonin 3 MG TABS Take 1 tablet (3 mg total) by mouth at bedtime as needed.  0  . metFORMIN (GLUCOPHAGE) 1000 MG tablet take 1 tablet by mouth twice a day with meals 180 tablet 4  . B-D INS SYRINGE 0.5CC/30GX1/2" 30G X 1/2" 0.5 ML MISC USE AS DIRECTED 100 each 1  . ferrous sulfate 325 (65 FE) MG tablet Take 1 tablet (325 mg total) by mouth 3 (three)  times daily with meals. (Patient not taking: Reported on 07/28/2014) 90 tablet 3  . fluticasone (FLONASE) 50 MCG/ACT nasal spray Place 2 sprays into both nostrils daily. (Patient not taking: Reported on 08/07/2014) 16 g 2   Current Facility-Administered Medications  Medication Dose Route Frequency Provider Last Rate Last Dose  . atorvastatin (LIPITOR) tablet 20 mg  20 mg Oral q1800 Corky Sox, MD      . heparin injection 5,000 Units  5,000 Units Subcutaneous 3 times per day Corky Sox, MD      . insulin aspart (novoLOG) injection 0-5 Units  0-5 Units Subcutaneous QHS Corky Sox, MD      . insulin aspart (novoLOG) injection 0-9 Units  0-9 Units Subcutaneous TID WC Corky Sox, MD      . insulin detemir (LEVEMIR)  injection 16 Units  16 Units Subcutaneous BID Corky Sox, MD      . predniSONE (DELTASONE) tablet 5 mg  5 mg Oral Q48H Bertha Stakes, MD       And  . Derrill Memo ON 08/10/2014] predniSONE (DELTASONE) tablet 10 mg  10 mg Oral Q48H Bertha Stakes, MD      . sodium chloride 0.9 % injection 3 mL  3 mL Intravenous Q12H Corky Sox, MD        Past Medical History  Diagnosis Date  . Sarcoidosis     Followed by Dr. Melvyn Novas; w/ liver involvement per biopsy 12/09, Reversible airway component so started on [facility] 01/2010; HFA 75% p coaching 05/2010  . Hypoxemia     CT angiogram 9/11>> No PE; PFTs 10/11- FEV1 1.20 (49%) with 16% better p B2, DLCO 33%> corrects to 84; O2 sats ok on 4 lpm X rapid walk X 3 laps 05/2010  . Morbid obesity     Target wt= 153 for BMI <30  . QT prolongation   . Hypertension   . Hx of cardiac cath 2/08    No CAD, no RAS,  normal EF  . Seborrheic dermatitis of scalp   . 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 Hep B serology neg. but Hep C ab +, HIV neg. 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 Cardiomyopathy, nonischemic     EF 45% 12/10; Echo 7/11 normal EF, PAS 48  . Diabetic retinopathy     Right eye 2/11  . Health maintenance examination     Mammogram 05/2010 Negative; Last Pap smear 03/2008; Last DM eye exam 2/11> mild non-proliferative diabetic retinopathy. OD  . Helicobacter pylori ab+ 05/2011    Pt was symptomatic and treatment planned for 05/2011  . CHF (congestive heart failure)   . Complication of anesthesia     " difficult waking "  . Sleep apnea   . Diabetes mellitus     insulin dependent  . Acute bronchitis 07/29/2014    Past Surgical History  Procedure Laterality Date  . Tubal ligation    . Breast surgery    . Cesarean section    . Tracheostomy tube placement N/A 08/10/2013    Procedure: TRACHEOSTOMY;  Surgeon: Melissa Montane, MD;  Location: [facility];  Service: ENT;  Laterality: N/A;      Allergies: Allergies as of 08/08/2014 - Review Complete 08/08/2014  Allergen Reaction Noted  . Vicodin [hydrocodone-acetaminophen] Itching 11/17/2010    Family History  Problem Relation Age of Onset  . Cancer Mother  colon cancer  . Multiple sclerosis Father   . Asthma Sister     in childhood  . Diabetes Father   . Hypertension      History   Social History  . Marital Status: Single    Spouse Name: N/A  . Number of Children: 2  . Years of Education: N/A   Occupational History  . 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 Smoking status: Current Every Day Smoker -- 0.25 packs/day for 20 years    Types: Cigarettes  . Smokeless tobacco: Never Used     Comment: Needs to cut back.  . Alcohol Use: No  . Drug Use: No  . Sexual Activity: Not on file   Other Topics Concern  . Not on file   Social History Narrative   Diabetic card given 05/03/2010.   Financial assistance approved for 100% discount at [facility] and has [facility] card. [name] 12/07/2009.      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     Review of Systems: All pertinent ROS as stated in HPI.   Physical Exam: Blood pressure 110/70, pulse 87, temperature 98 F (36.7 C), temperature source Oral, resp. rate 18, height 5\' 1"  (1.549 m), weight 247 lb 3.2 oz (112.129 kg), last menstrual period 07/25/2014, SpO2 94 %. General: resting in bed, in no acute distress, trach collar in place, able to speak in full sentences HEENT: PERRL, EOMI, no scleral icterus Cardiac: RRR, no rubs, murmurs or gallops Pulm: Bilateral rhonchi, slight increased work of breathing Abd: soft, nontender, nondistended, BS present Ext: warm and well perfused, 1+ pitting edema bilaterally Neuro: alert and oriented X3, cranial nerves II-XII grossly intact Skin: no rashes or lesions noted Psych: appropriate affect  Lab results: Basic Metabolic Panel:  Recent Labs  08/07/14 1653  08/08/14 2323  NA 137 137  K 5.8* 4.7  CL 96 98  CO2 30 26  GLUCOSE 233* 241*  BUN 42* 52*  CREATININE 2.69* 3.34*  CALCIUM 8.7 8.2*  MG 1.2*  --    Liver Function Tests:  Recent Labs  08/08/14 2323  AST 18  ALT 15  ALKPHOS 87  BILITOT 0.9  PROT 7.1  ALBUMIN 3.3*   No results for input(s): LIPASE, AMYLASE in the last 72 hours. No results for input(s): AMMONIA in the last 72 hours. CBC:  Recent Labs  08/08/14 2323  WBC 10.4  NEUTROABS 7.1  HGB 11.5*  HCT 38.2  MCV 97.2  PLT 268   Cardiac Enzymes:  Recent Labs  08/08/14 2323  TROPONINI <0.03   BNP: No results for input(s): PROBNP in the last 72 hours. D-Dimer: No results for input(s): DDIMER in the last 72 hours. CBG:  Recent Labs  08/07/14 1528  GLUCAP 232*   Hemoglobin A1C: No results for input(s): HGBA1C in the last 72 hours. Fasting Lipid Panel: No results for input(s): CHOL, HDL, LDLCALC, TRIG, CHOLHDL, LDLDIRECT in the last 72 hours. Thyroid Function Tests: No results for input(s): TSH, T4TOTAL, FREET4, T3FREE, THYROIDAB in the last 72 hours. Anemia Panel: No results for input(s): VITAMINB12, FOLATE, FERRITIN, TIBC, IRON, RETICCTPCT in the last 72 hours. Coagulation: No results for input(s): LABPROT, INR in the last 72 hours. Urine Drug Screen: Drugs of Abuse  No results found for: LABOPIA, COCAINSCRNUR, LABBENZ, AMPHETMU, THCU, LABBARB  Alcohol Level: No results for input(s): ETH in the last 72 hours. Urinalysis: No results for  input(s): COLORURINE, LABSPEC, PHURINE, GLUCOSEU, HGBUR, BILIRUBINUR, KETONESUR, PROTEINUR, UROBILINOGEN, NITRITE, LEUKOCYTESUR in the last 72 hours.  Invalid input(s): APPERANCEUR  Imaging results:  Dg Chest 2 View  08/08/2014   CLINICAL DATA:  Cough and wheezing for 8 days.  History of sarcoid.  EXAM: CHEST  2 VIEW  COMPARISON:  07/28/2014 and prior chest radiographs dating back to 10/05/2010.  FINDINGS: Cardiomegaly and a tracheostomy tube again noted.  The  cardiomediastinal silhouette is unchanged.  Interstitial opacities bilaterally and mild peribronchial thickening are unchanged.  There is no evidence of focal airspace disease, pulmonary edema, suspicious pulmonary nodule/mass, pleural effusion, or pneumothorax. No acute bony abnormalities are identified.  IMPRESSION: No evidence of acute cardiopulmonary disease.  Cardiomegaly and chronic bilateral interstitial opacities.   Electronically Signed   By: Margarette Canada M.D.   On: 08/08/2014 09:19   Dg Chest Port 1 View  08/09/2014   CLINICAL DATA:  Shortness of breath worsening since 08/07/2014. History of sarcoidosis, hypertension, CHF.  EXAM: PORTABLE CHEST - 1 VIEW  COMPARISON:  08/07/2014  FINDINGS: Tracheostomy. Mild cardiac enlargement. Interval development of increasing perihilar vascularity and infiltration in the lung bases suggesting developing edema or pneumonia. No blunting of costophrenic angles. No new pneumothorax. Degenerative changes in the shoulders.  IMPRESSION: Increasing perihilar vascularity and infiltration in the lung bases suggesting developing edema or pneumonia.   Electronically Signed   By: Lucienne Capers M.D.   On: 08/09/2014 00:09    Other results: EKG Interpretation  Date/Time:  Saturday August 08 2014 22:41:58 EDT Ventricular Rate:  97 PR Interval:  132 QRS Duration: 84 QT Interval:  360 QTC Calculation: 457 R Axis:   -36 Text Interpretation:  Normal sinus rhythm Left axis deviation Anterior infarct , age undetermined Abnormal ECG Confirmed by Lita Mains  MD, DAVID ([id number]) on 08/08/2014 11:41:15 PM   Assessment & Plan by Problem: Active Problems:   Acute on chronic diastolic CHF (congestive heart failure)  Patient is a 44 year old female with a history of congestive heart failure, chronic kidney disease, sarcoidosis, hypertension, type 2 diabetes, obesity hypoventilation syndrome (with tracheostomy) who is admitted to the hospital for acute hypoxic respiratory  failure.  Acute hypoxic respiratory failure likely secondary to decompensated congestive heart failure: Chest x-ray showing increased vascularity and infiltration in the lung bases suggestive of edema versus pneumonia. Patient status post 80 mg Lasix IV in the emergency department. Patient with an elevated BNP of 853 compared to 154 from 07/28/2014. Troponin negative 1. Last echocardiogram from June 2015 showing a preserved ejection fraction of 60-65% with grade 2 diastolic dysfunction. At home, patient uses 2 L of nasal cannula supplemental oxygen during the day and sleeps with a trach collar at night.  -Start with Lasix 80 mg IV -Strict ins and outs -Daily weights -Repeat echocardiogram  Acute on chronic kidney disease: Creatinine increased from 2.69 on the day prior to presentation to 3.34 on the day of presentation. Patient's creatinine as low as 1.08 in January 2016. There is some concurrent rise in the BUN as well. Patient also reports reduced urine output prior to presentation. Would question whether this represents cardiorenal syndrome. No concerning electrolyte abnormalities at this point. -Recheck basic metabolic panel status post diuresis in the morning. -Urine urea and renal ultrasound.  -Consider nephrology consult given rising creatinine and reduction in urine output.  Sarcoidosis: Patient states that she takes prednisone on an alternating schedule between 5 and 10 mg daily. -Continue home prednisone at 5 mg daily.  Hypertension: Patient  overall low to normotensive upon initial evaluation. At home, patient is on Coreg 12.5 mg twice a day, amlodipine 5 mg daily, Lasix 40 mg daily, irbesartan 150 mg daily. -Hold home medications given low to normal blood pressures  Type 2 diabetes: At home, patient is on Levemir 28 units in the morning and 20 units at night in addition to a sliding scale insulin. -Levemir 16 units in the morning and 16 units in the evening  -Sensitive sliding  scale  Obesity hypoventilation syndrome: Tracheostomy placed in April 2015 with inability to wean from the collar since. -Continue with oxygen supplementation via trach collar as needed.  Diet: Carb modified Prophylaxis: heparin sub q Code: Full  Dispo: Disposition is deferred at this time, awaiting improvement of current medical problems. Anticipated discharge in approximately 3 day(s).   The patient does have a current PCP Wilber Oliphant, MD) and does need an [facility] hospital follow-up appointment after discharge.  The patient does not have transportation limitations that hinder transportation to clinic appointments.  Signed: Luan Moore, M.D., Ph.D. Internal Medicine Teaching Service, PGY-1 08/09/2014, 4:26 AM

## 2014-08-10 ENCOUNTER — Other Ambulatory Visit: Payer: Self-pay

## 2014-08-10 DIAGNOSIS — J810 Acute pulmonary edema: Secondary | ICD-10-CM

## 2014-08-10 DIAGNOSIS — R0689 Other abnormalities of breathing: Secondary | ICD-10-CM

## 2014-08-10 DIAGNOSIS — D869 Sarcoidosis, unspecified: Secondary | ICD-10-CM

## 2014-08-10 DIAGNOSIS — I5033 Acute on chronic diastolic (congestive) heart failure: Principal | ICD-10-CM

## 2014-08-10 DIAGNOSIS — E875 Hyperkalemia: Secondary | ICD-10-CM

## 2014-08-10 DIAGNOSIS — I272 Other secondary pulmonary hypertension: Secondary | ICD-10-CM

## 2014-08-10 DIAGNOSIS — N179 Acute kidney failure, unspecified: Secondary | ICD-10-CM

## 2014-08-10 DIAGNOSIS — J9601 Acute respiratory failure with hypoxia: Secondary | ICD-10-CM

## 2014-08-10 DIAGNOSIS — E119 Type 2 diabetes mellitus without complications: Secondary | ICD-10-CM

## 2014-08-10 DIAGNOSIS — I10 Essential (primary) hypertension: Secondary | ICD-10-CM

## 2014-08-10 DIAGNOSIS — N390 Urinary tract infection, site not specified: Secondary | ICD-10-CM

## 2014-08-10 DIAGNOSIS — E785 Hyperlipidemia, unspecified: Secondary | ICD-10-CM

## 2014-08-10 DIAGNOSIS — I509 Heart failure, unspecified: Secondary | ICD-10-CM

## 2014-08-10 DIAGNOSIS — E669 Obesity, unspecified: Secondary | ICD-10-CM

## 2014-08-10 LAB — BASIC METABOLIC PANEL
Anion gap: 7 (ref 5–15)
Anion gap: 14 (ref 5–15)
Anion gap: 9 (ref 5–15)
BUN: 64 mg/dL — ABNORMAL HIGH (ref 6–23)
BUN: 64 mg/dL — ABNORMAL HIGH (ref 6–23)
BUN: 61 mg/dL — ABNORMAL HIGH (ref 6–23)
CO2: 32 mmol/L (ref 19–32)
CO2: 25 mmol/L (ref 19–32)
CO2: 30 mmol/L (ref 19–32)
Calcium: 7.7 mg/dL — ABNORMAL LOW (ref 8.4–10.5)
Calcium: 8.2 mg/dL — ABNORMAL LOW (ref 8.4–10.5)
Calcium: 8.2 mg/dL — ABNORMAL LOW (ref 8.4–10.5)
Chloride: 97 mmol/L (ref 96–112)
Chloride: 97 mmol/L (ref 96–112)
Chloride: 96 mmol/L (ref 96–112)
Creatinine, Ser: 4.75 mg/dL — ABNORMAL HIGH (ref 0.50–1.10)
Creatinine, Ser: 3.9 mg/dL — ABNORMAL HIGH (ref 0.50–1.10)
Creatinine, Ser: 3.45 mg/dL — ABNORMAL HIGH (ref 0.50–1.10)
GFR calc Af Amer: 12 mL/min — ABNORMAL LOW (ref >90)
GFR calc Af Amer: 15 mL/min — ABNORMAL LOW (ref >90)
GFR calc Af Amer: 17 mL/min — ABNORMAL LOW (ref >90)
GFR calc non Af Amer: 10 mL/min — ABNORMAL LOW (ref >90)
GFR calc non Af Amer: 13 mL/min — ABNORMAL LOW (ref >90)
GFR calc non Af Amer: 15 mL/min — ABNORMAL LOW (ref >90)
Glucose, Bld: 242 mg/dL — ABNORMAL HIGH (ref 70–99)
Glucose, Bld: 292 mg/dL — ABNORMAL HIGH (ref 70–99)
Glucose, Bld: 356 mg/dL — ABNORMAL HIGH (ref 70–99)
Potassium: 6.1 mmol/L (ref 3.5–5.1)
Potassium: 5.5 mmol/L — ABNORMAL HIGH (ref 3.5–5.1)
Potassium: 5.2 mmol/L — ABNORMAL HIGH (ref 3.5–5.1)
Sodium: 136 mmol/L (ref 135–145)
Sodium: 136 mmol/L (ref 135–145)
Sodium: 135 mmol/L (ref 135–145)

## 2014-08-10 LAB — UREA NITROGEN, URINE: Urea Nitrogen, Ur: 170 mg/dL

## 2014-08-10 LAB — CBC
HCT: 35.5 % — ABNORMAL LOW (ref 36.0–46.0)
Hemoglobin: 10.3 g/dL — ABNORMAL LOW (ref 12.0–15.0)
MCH: 29.1 pg (ref 26.0–34.0)
MCHC: 29 g/dL — ABNORMAL LOW (ref 30.0–36.0)
MCV: 100.3 fL — ABNORMAL HIGH (ref 78.0–100.0)
Platelets: 251 10*3/uL (ref 150–400)
RBC: 3.54 MIL/uL — ABNORMAL LOW (ref 3.87–5.11)
RDW: 16.1 % — ABNORMAL HIGH (ref 11.5–15.5)
WBC: 9.8 10*3/uL (ref 4.0–10.5)

## 2014-08-10 LAB — GLUCOSE, CAPILLARY
Glucose-Capillary: 205 mg/dL — ABNORMAL HIGH (ref 70–99)
Glucose-Capillary: 213 mg/dL — ABNORMAL HIGH (ref 70–99)
Glucose-Capillary: 287 mg/dL — ABNORMAL HIGH (ref 70–99)
Glucose-Capillary: 333 mg/dL — ABNORMAL HIGH (ref 70–99)
Glucose-Capillary: 246 mg/dL — ABNORMAL HIGH (ref 70–99)

## 2014-08-10 MED ORDER — SODIUM POLYSTYRENE SULFONATE 15 GM/60ML PO SUSP
45.0000 g | Freq: Once | ORAL | Status: AC
  Administered 2014-08-10: 45 g via ORAL
  Filled 2014-08-10: qty 180

## 2014-08-10 MED ORDER — INSULIN DETEMIR 100 UNIT/ML ~~LOC~~ SOLN
20.0000 [IU] | Freq: Two times a day (BID) | SUBCUTANEOUS | Status: DC
  Filled 2014-08-10: qty 0.2

## 2014-08-10 MED ORDER — INSULIN ASPART 100 UNIT/ML ~~LOC~~ SOLN
0.0000 [IU] | Freq: Every day | SUBCUTANEOUS | Status: DC
  Administered 2014-08-10: 2 [IU] via SUBCUTANEOUS
  Administered 2014-08-11: 4 [IU] via SUBCUTANEOUS
  Administered 2014-08-12 – 2014-08-15 (×2): 2 [IU] via SUBCUTANEOUS

## 2014-08-10 MED ORDER — ALBUTEROL SULFATE (2.5 MG/3ML) 0.083% IN NEBU
2.5000 mg | INHALATION_SOLUTION | Freq: Once | RESPIRATORY_TRACT | Status: AC
  Administered 2014-08-07: 2.5 mg via RESPIRATORY_TRACT

## 2014-08-10 MED ORDER — SODIUM CHLORIDE 0.9 % IV SOLN
1.0000 g | Freq: Once | INTRAVENOUS | Status: AC
  Administered 2014-08-10: 1 g via INTRAVENOUS
  Filled 2014-08-10: qty 10

## 2014-08-10 MED ORDER — DEXTROSE 50 % IV SOLN
INTRAVENOUS | Status: AC
  Filled 2014-08-10: qty 50

## 2014-08-10 MED ORDER — INSULIN ASPART 100 UNIT/ML ~~LOC~~ SOLN
0.0000 [IU] | Freq: Three times a day (TID) | SUBCUTANEOUS | Status: DC
  Administered 2014-08-11: 15 [IU] via SUBCUTANEOUS
  Administered 2014-08-11: 4 [IU] via SUBCUTANEOUS
  Administered 2014-08-11 – 2014-08-13 (×3): 7 [IU] via SUBCUTANEOUS
  Administered 2014-08-13: 4 [IU] via SUBCUTANEOUS
  Administered 2014-08-14: 3 [IU] via SUBCUTANEOUS
  Administered 2014-08-14: 4 [IU] via SUBCUTANEOUS
  Administered 2014-08-14 – 2014-08-15 (×2): 11 [IU] via SUBCUTANEOUS
  Administered 2014-08-15: 3 [IU] via SUBCUTANEOUS
  Administered 2014-08-15: 4 [IU] via SUBCUTANEOUS
  Administered 2014-08-16: 7 [IU] via SUBCUTANEOUS
  Administered 2014-08-16: 3 [IU] via SUBCUTANEOUS

## 2014-08-10 MED ORDER — INSULIN DETEMIR 100 UNIT/ML ~~LOC~~ SOLN
20.0000 [IU] | Freq: Every day | SUBCUTANEOUS | Status: DC
  Administered 2014-08-10: 20 [IU] via SUBCUTANEOUS
  Filled 2014-08-10 (×2): qty 0.2

## 2014-08-10 MED ORDER — DEXTROSE 50 % IV SOLN
1.0000 | Freq: Once | INTRAVENOUS | Status: AC
  Administered 2014-08-10: 50 mL via INTRAVENOUS
  Filled 2014-08-10: qty 50

## 2014-08-10 MED ORDER — INSULIN ASPART 100 UNIT/ML ~~LOC~~ SOLN
10.0000 [IU] | Freq: Once | SUBCUTANEOUS | Status: AC
  Administered 2014-08-10: 10 [IU] via SUBCUTANEOUS

## 2014-08-10 MED ORDER — INSULIN ASPART 100 UNIT/ML ~~LOC~~ SOLN: 3.0000 [IU] | Freq: Three times a day (TID) | SUBCUTANEOUS | Status: DC

## 2014-08-10 MED ORDER — PREDNISONE 50 MG PO TABS
60.0000 mg | ORAL_TABLET | Freq: Every day | ORAL | Status: DC
  Filled 2014-08-10: qty 1

## 2014-08-10 MED ORDER — SODIUM POLYSTYRENE SULFONATE 15 GM/60ML PO SUSP
15.0000 g | Freq: Once | ORAL | Status: AC
  Administered 2014-08-10: 15 g via ORAL
  Filled 2014-08-10: qty 60

## 2014-08-10 NOTE — Progress Notes (Signed)
Subjective:    She reports doing better this morning with improvement of her breathing. She has had a little bit of urine output through her catheter. She denies chest pain or palpitations. She thinks her lower extremity edema is stable.  Interval Events: -Vital signs stable overnight.  -Urinated 825 mL yesterday, 300 mL this morning.  -Potassium elevated at 6.1 this morning, given calcium gluconate, insulin/D50, and Kayexalate. Down to 5.5 on repeat measurement.   Objective:    Vital Signs:   Temp:  [97.9 F (36.6 C)-98.8 F (37.1 C)] 98.8 F (37.1 C) (04/11 0522) Pulse Rate:  [83-98] 98 (04/11 1203) Resp:  [16-20] 20 (04/11 1203) BP: (78-107)/(40-71) 99/64 mmHg (04/11 0522) SpO2:  [90 %-97 %] 94 % (04/11 1203) FiO2 (%):  [50 %-80 %] 60 % (04/11 1203) Weight:  [251 lb 11.2 oz (114.17 kg)] 251 lb 11.2 oz (114.17 kg) (04/11 0522) Last BM Date: 08/09/14  24-hour weight change: Weight change: 4 lb 11.2 oz (2.132 kg)  Intake/Output:   Intake/Output Summary (Last 24 hours) at 08/10/14 1312 Last data filed at 08/10/14 1200  Gross per 24 hour  Intake 1925.09 ml  Output   1125 ml  Net 800.09 ml      Physical Exam: General: Well-developed, well-nourished, in no acute distress, appropriate and cooperative throughout examination.  Trach collar in place.  Lungs:  Normal work of breathing, mild rhonchi bilaterally.   Heart: RRR. S1 and S2 normal without gallop, murmur, or rubs.  Abdomen:  BS normoactive. Soft, Nondistended, non-tender.  No masses or organomegaly.  Extremities: 1+ pitting edema bilaterally, stable.     Labs:  Basic Metabolic Panel:  Recent Labs Lab 08/07/14 1653 08/08/14 2323 08/09/14 0540 08/09/14 1600 08/10/14 0405 08/10/14 1220  NA 137 137 136  --  136 136  K 5.8* 4.7 5.0  --  6.1* 5.5*  CL 96 98 96  --  97 97  CO2 30 26 23   --  32 25  GLUCOSE 233* 241* 226*  --  242* 292*  BUN 42* 52* 55*  --  64* 64*  CREATININE 2.69* 3.34* 3.63*  --  4.75*  3.90*  CALCIUM 8.7 8.2* 7.9*  --  7.7* 8.2*  MG 1.2*  --   --  1.4*  --   --     Liver Function Tests:  Recent Labs Lab 08/08/14 2323  AST 18  ALT 15  ALKPHOS 87  BILITOT 0.9  PROT 7.1  ALBUMIN 3.3*   CBC:  Recent Labs Lab 08/08/14 2323 08/09/14 0540 08/10/14 0405  WBC 10.4 10.7* 9.8  NEUTROABS 7.1  --   --   HGB 11.5* 10.9* 10.3*  HCT 38.2 37.0 35.5*  MCV 97.2 98.9 100.3*  PLT 268 256 251    Cardiac Enzymes:  Recent Labs Lab 08/08/14 2323  TROPONINI <0.03   CBG:  Recent Labs Lab 08/09/14 1626 08/09/14 2046 08/10/14 0552 08/10/14 0819 08/10/14 1158  GLUCAP 252* 186* 205* 213* 287*    Microbiology: Results for orders placed or performed during the hospital encounter of 07/28/14  MRSA PCR Screening     Status: None   Collection Time: 07/29/14  3:21 AM  Result Value Ref Range Status   MRSA by PCR NEGATIVE NEGATIVE Final    Comment:        The GeneXpert MRSA Assay (FDA approved for NASAL specimens only), is one component of a comprehensive MRSA colonization surveillance program. It is not intended to diagnose MRSA infection  nor to guide or monitor treatment for MRSA infections.    Other results: EKG: Normal sinus rhythm, left axis deviation, possible left atrial enlargement, T-wave inversion in aVR, unchanged from prior, normal T waves otherwise.  Imaging: X-ray Chest Pa And Lateral  08/09/2014   CLINICAL DATA:  44 year old female with acute on chronic diastolic congestive heart failure. Shortness of Breath. Initial encounter.  EXAM: CHEST  2 VIEW  COMPARISON:  08/08/2014 and earlier.  FINDINGS: Lower lung volumes. Stable cardiomegaly and mediastinal contours. Tracheostomy remains in place. Mildly increased interstitial and streaky mid and lower lung opacity. No pneumothorax. No definite pleural effusion. Stable visualized osseous structures.  IMPRESSION: Streaky and interstitial bilateral mid and lower lung opacity has mildly progressed.  Differential considerations include increased atelectasis, mild interstitial edema, or less likely bilateral pneumonia. No pleural effusion identified.   Electronically Signed   By: Genevie Ann M.D.   On: 08/09/2014 09:39   US Renal  08/09/2014   CLINICAL DATA:  44 year old female with acute renal failure.  EXAM: RENAL/URINARY TRACT ULTRASOUND COMPLETE  COMPARISON:  None.  FINDINGS: Right Kidney:  Length: 11.4 cm. Echogenicity within normal limits. No mass or hydronephrosis visualized.  Left Kidney:  Length: 11.2 cm. Echogenicity within normal limits. No mass or hydronephrosis visualized.  Bladder:  Appears normal for degree of bladder distention.  IMPRESSION: Normal renal ultrasound.  No evidence of hydronephrosis.   Electronically Signed   By: Margarette Canada M.D.   On: 08/09/2014 16:08   Dg Chest Port 1 View  08/09/2014   CLINICAL DATA:  Shortness of breath worsening since 08/07/2014. History of sarcoidosis, hypertension, CHF.  EXAM: PORTABLE CHEST - 1 VIEW  COMPARISON:  08/07/2014  FINDINGS: Tracheostomy. Mild cardiac enlargement. Interval development of increasing perihilar vascularity and infiltration in the lung bases suggesting developing edema or pneumonia. No blunting of costophrenic angles. No new pneumothorax. Degenerative changes in the shoulders.  IMPRESSION: Increasing perihilar vascularity and infiltration in the lung bases suggesting developing edema or pneumonia.   Electronically Signed   By: Lucienne Capers M.D.   On: 08/09/2014 00:09       Medications:    Infusions: . sodium chloride 65 mL/hr at 08/09/14 1823    Scheduled Medications: . atorvastatin  20 mg Oral q1800  . budesonide (PULMICORT) nebulizer solution  0.25 mg Nebulization BID  . calcium-vitamin D  1 tablet Oral BID WC  . cefTRIAXone (ROCEPHIN)  IV  2 g Intravenous Q24H  . dextrose      . docusate sodium  100 mg Oral BID  . famotidine  20 mg Oral Daily  . ferrous sulfate  325 mg Oral Q breakfast  . heparin  5,000  Units Subcutaneous 3 times per day  . insulin aspart  0-15 Units Subcutaneous TID WC  . insulin aspart  0-5 Units Subcutaneous QHS  . insulin detemir  16 Units Subcutaneous BID  . ipratropium-albuterol  3 mL Nebulization Q6H  . [START ON 08/11/2014] predniSONE  10 mg Oral Q48H   And  . predniSONE  20 mg Oral Q48H  . sodium chloride  3 mL Intravenous Q12H    PRN Medications:    Assessment/ Plan:    Active Problems:   Acute on chronic diastolic CHF (congestive heart failure)  #Acute on chronic kidney disease Creatinine continues to rise to 4.75 today, but is down to 3.9 on recheck this afternoon. She did receive Lasix 80 mg IV yesterday morning, which may have led to the continued rise. She  does not appear to be volume overloaded on exam. Renal with no new recommendations. We'll wait results of echo and continue gentle hydration. If normal ejection fraction, will increase fluids overnight. She also has evidence of a urinary tract infection on her urinalysis, which could potentially be contributing. Fractional excretion of urea of 3.9%, which is consistent with prerenal. Renal ultrasound unremarkable with no evidence of hydronephrosis. This could've been prerenal initially, which has progressed to ATN. -Nephrology following. -Continue normal saline at 65 mL per hour. -Follow-up echocardiogram, and increase fluids if normal ejection fraction. -Heart healthy/Carb modified diet. -Foley catheter per nephrology to monitor urine output. -Recheck creatinine in the morning.  #Hyperkalemia Potassium improved but still elevated after treatment, no EKG changes. This is most likely due to her renal failure. She has not had a bowel movement yet. -Repeat kayexalate 45 g oral. -Repeat BMP at 1800.  #Urinary tract infection 21-50 white blood cells on urinalysis with many bacteria. -Continue ceftriaxone IV, day 2. -Follow-up urine culture.  #Acute hypoxic respiratory failure in the setting of  decompensated congestive heart failure Her respiratory status appears to be improved today. Potentially correlated with use of her trach collar. She has minimal edema on exam, which is not changed with fluids since yesterday. Only grade 2 diastolic dysfunction on previous echo, but ejection fraction is been as low as 35% previously. Awaiting repeat echocardiogram today. -Continue to hold Lasix. -Called echo lab to get echocardiogram completed today stat. -Continue strict ins and outs. -Daily weights. -Consider cardiology consultation tomorrow pending results of echo.  #Obesity hypoventilation syndrome Most likely cause of her shortness of breath, which is improved on her trach collar. -Continue oxygen supplement vision via trach collar as needed and keep oxygen saturations greater than 92%. -Continue DuoNeb's every 6 hours. -Continue Pulmicort nebulization twice a day.  #Sarcoidosis with pulmonary hypertension Her blood pressure slightly low yesterday, so her prednisone was increased for stress dose steroids. Severe decrease in diffusing capacity on previous pulmonary function tests, potentially related to sarcoidosis. Mildly to moderately increased pulmonary hypertension in the past on echocardiogram. Some question of cardiac sarcoidosis by cardiologists. -Continue prednisone alternating 10 and 20 mg daily for stress dosing. -Follow-up pulmonary artery pressures on echocardiogram today.  #Hypertension Blood pressure remains low off of home pressure meds. -Continue to hold home Coreg 12.5 mg twice a day, amlodipine 5 mg daily, Lasix 40 mg daily, and irbesartan 150 mg daily.  #Type 2 diabetes Blood sugars elevated today, potentially due to elevated steroids. -Increase Levemir to 20 units twice a day. -Continue sliding scale insulin moderate before meals at bedtime. Consider increasing to resistant tomorrow.  #Hyperlipidemia -Continue home atorvastatin 20 mg daily.   DVT PPX -  heparin  CODE STATUS - Full.  CONSULTS PLACED - Nephrology.  DISPO - Disposition is deferred at this time, awaiting improvement of AKI.   Anticipated discharge in approximately 2-4 day(s).   The patient does have a current PCP Jerene Pitch, MD) and does need an [facility] hospital follow-up appointment after discharge.    Is the [facility] hospital follow-up appointment a one-time only appointment? no.  Does the patient have transportation limitations that hinder transportation to clinic appointments? yes   SERVICE NEEDED AT [place]         Y = Yes, Blank = No PT:   OT:   RN:   Equipment:   Other:      Length of Stay: 1 day(s)   Signed:  Charlesetta Shanks, MD  PGY-1, Internal Medicine Resident Pager: [phone] (7AM-5PM) 08/10/2014, 1:12 PM

## 2014-08-10 NOTE — Addendum Note (Signed)
Addended by: Sander Nephew F on: 08/10/2014 09:20 AM   Modules accepted: Orders

## 2014-08-10 NOTE — Progress Notes (Signed)
Patient seen and examined. Case d/w residents in detail.   Patient feels well but with persistent LE edema. Lungs are clear to auscultation. She also continues to have poor urine output. Creatinine continues to worsen. c/w IVF for now. Renal sono wnl. Patient also noted to be hyperkalemic today. Will check EKG and give Calcium, D50, insulin and kayexalate. Recheck potassium today. If continues to worsen patient may need HD. C/w Foley cath for now.  Hold BP meds as BP remains borderline. Will monitor   Patient with possible UTI. Will f/u urine cx and continue with rocephin for now

## 2014-08-10 NOTE — Progress Notes (Signed)
Assessment: 1. 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 Morbid obesity 5. DM2 6. HTN - now low 7. Pyuria r/o UTI Plan- f/u repeat potassium, and use kayexalate prn  Subjective: Interval History: high K today  Objective: Vital signs in last 24 hours: Temp:  [97.9 F (36.6 C)-98.8 F (37.1 C)] 98.8 F (37.1 C) (04/11 0522) Pulse Rate:  [83-98] 98 (04/11 1203) Resp:  [16-20] 20 (04/11 1203) BP: (78-107)/(40-71) 99/64 mmHg (04/11 0522) SpO2:  [90 %-97 %] 94 % (04/11 1203) FiO2 (%):  [50 %-80 %] 60 % (04/11 1203) Weight:  [114.17 kg (251 lb 11.2 oz)] 114.17 kg (251 lb 11.2 oz) (04/11 0522) Weight change: 2.132 kg (4 lb 11.2 oz)  Intake/Output from previous day: 04/10 0701 - 04/11 0700 In: 1775.1 [P.O.:820; I.V.:755.1] Out: 825 [Urine:825] Intake/Output this shift: Total I/O In: 470 [P.O.:360; IV Piggyback:110] Out: 300 [Urine:300]  Head: Normocephalic, without obvious abnormality, atraumatic, trach Resp: clear to auscultation bilaterally Cardio: regular rate and rhythm, S1, S2 normal, no murmur, click, rub or gallop Extremities: extremities normal, atraumatic, no cyanosis or edema  Lab Results:  Recent Labs  08/09/14 0540 08/10/14 0405  WBC 10.7* 9.8  HGB 10.9* 10.3*  HCT 37.0 35.5*  PLT 256 251   BMET:  Recent Labs  08/09/14 0540 08/10/14 0405  NA 136 136  K 5.0 6.1*  CL 96 97  CO2 23 32  GLUCOSE 226* 242*  BUN 55* 64*  CREATININE 3.63* 4.75*  CALCIUM 7.9* 7.7*   No results for input(s): PTH in the last 72 hours. Iron Studies: No results for input(s): IRON, TIBC, TRANSFERRIN, FERRITIN in the last 72 hours. Studies/Results: X-ray Chest Pa And Lateral  08/09/2014   CLINICAL DATA:  44 year old female with acute on chronic diastolic congestive heart failure. Shortness of Breath. Initial encounter.  EXAM: CHEST  2  VIEW  COMPARISON:  08/08/2014 and earlier.  FINDINGS: Lower lung volumes. Stable cardiomegaly and mediastinal contours. Tracheostomy remains in place. Mildly increased interstitial and streaky mid and lower lung opacity. No pneumothorax. No definite pleural effusion. Stable visualized osseous structures.  IMPRESSION: Streaky and interstitial bilateral mid and lower lung opacity has mildly progressed. Differential considerations include increased atelectasis, mild interstitial edema, or less likely bilateral pneumonia. No pleural effusion identified.   Electronically Signed   By: Genevie Ann M.D.   On: 08/09/2014 09:39   US Renal  08/09/2014   CLINICAL DATA:  44 year old female with acute renal failure.  EXAM: RENAL/URINARY TRACT ULTRASOUND COMPLETE  COMPARISON:  None.  FINDINGS: Right Kidney:  Length: 11.4 cm. Echogenicity within normal limits. No mass or hydronephrosis visualized.  Left Kidney:  Length: 11.2 cm. Echogenicity within normal limits. No mass or hydronephrosis visualized.  Bladder:  Appears normal for degree of bladder distention.  IMPRESSION: Normal renal ultrasound.  No evidence of hydronephrosis.   Electronically Signed   By: Margarette Canada M.D.   On: 08/09/2014 16:08   Dg Chest Port 1 View  08/09/2014   CLINICAL DATA:  Shortness of breath worsening since 08/07/2014. History of sarcoidosis, hypertension, CHF.  EXAM: PORTABLE CHEST - 1 VIEW  COMPARISON:  08/07/2014  FINDINGS: Tracheostomy. Mild cardiac enlargement. Interval development of increasing perihilar vascularity and infiltration in the lung bases suggesting developing edema or pneumonia. No blunting of costophrenic angles. No new pneumothorax. Degenerative changes in the  shoulders.  IMPRESSION: Increasing perihilar vascularity and infiltration in the lung bases suggesting developing edema or pneumonia.   Electronically Signed   By: Lucienne Capers M.D.   On: 08/09/2014 00:09    Scheduled: . atorvastatin  20 mg Oral q1800  . budesonide  (PULMICORT) nebulizer solution  0.25 mg Nebulization BID  . calcium-vitamin D  1 tablet Oral BID WC  . cefTRIAXone (ROCEPHIN)  IV  2 g Intravenous Q24H  . dextrose      . docusate sodium  100 mg Oral BID  . famotidine  20 mg Oral Daily  . ferrous sulfate  325 mg Oral Q breakfast  . heparin  5,000 Units Subcutaneous 3 times per day  . insulin aspart  0-15 Units Subcutaneous TID WC  . insulin aspart  0-5 Units Subcutaneous QHS  . insulin detemir  16 Units Subcutaneous BID  . ipratropium-albuterol  3 mL Nebulization Q6H  . [START ON 08/11/2014] predniSONE  10 mg Oral Q48H   And  . predniSONE  20 mg Oral Q48H  . sodium chloride  3 mL Intravenous Q12H     LOS: 1 day   Melody Gray C 08/10/2014,12:23 PM

## 2014-08-10 NOTE — Progress Notes (Signed)
Echocardiogram 2D Echocardiogram has been performed.  Cordelle Dahmen 08/10/2014, 2:36 PM

## 2014-08-11 ENCOUNTER — Encounter: Payer: Self-pay | Admitting: *Deleted

## 2014-08-11 LAB — GLUCOSE, CAPILLARY
Glucose-Capillary: 189 mg/dL — ABNORMAL HIGH (ref 70–99)
Glucose-Capillary: 227 mg/dL — ABNORMAL HIGH (ref 70–99)
Glucose-Capillary: 341 mg/dL — ABNORMAL HIGH (ref 70–99)
Glucose-Capillary: 304 mg/dL — ABNORMAL HIGH (ref 70–99)

## 2014-08-11 LAB — BASIC METABOLIC PANEL
Anion gap: 7 (ref 5–15)
BUN: 51 mg/dL — ABNORMAL HIGH (ref 6–23)
CO2: 34 mmol/L — ABNORMAL HIGH (ref 19–32)
Calcium: 8.3 mg/dL — ABNORMAL LOW (ref 8.4–10.5)
Chloride: 101 mmol/L (ref 96–112)
Creatinine, Ser: 2.27 mg/dL — ABNORMAL HIGH (ref 0.50–1.10)
GFR calc Af Amer: 29 mL/min — ABNORMAL LOW (ref >90)
GFR calc non Af Amer: 25 mL/min — ABNORMAL LOW (ref >90)
Glucose, Bld: 210 mg/dL — ABNORMAL HIGH (ref 70–99)
Potassium: 4.9 mmol/L (ref 3.5–5.1)
Sodium: 142 mmol/L (ref 135–145)

## 2014-08-11 LAB — URINE CULTURE: Colony Count: >=100000

## 2014-08-11 LAB — CBC
HCT: 37.9 % (ref 36.0–46.0)
Hemoglobin: 10.9 g/dL — ABNORMAL LOW (ref 12.0–15.0)
MCH: 29 pg (ref 26.0–34.0)
MCHC: 28.8 g/dL — ABNORMAL LOW (ref 30.0–36.0)
MCV: 100.8 fL — ABNORMAL HIGH (ref 78.0–100.0)
Platelets: 229 10*3/uL (ref 150–400)
RBC: 3.76 MIL/uL — ABNORMAL LOW (ref 3.87–5.11)
RDW: 15.9 % — ABNORMAL HIGH (ref 11.5–15.5)
WBC: 9 10*3/uL (ref 4.0–10.5)

## 2014-08-11 MED ORDER — SODIUM CHLORIDE 0.9 % IJ SOLN
3.0000 mL | Freq: Two times a day (BID) | INTRAMUSCULAR | Status: DC
  Administered 2014-08-11 – 2014-08-12 (×3): 3 mL via INTRAVENOUS

## 2014-08-11 MED ORDER — ASPIRIN 81 MG PO CHEW
81.0000 mg | CHEWABLE_TABLET | ORAL | Status: AC
  Administered 2014-08-12: 81 mg via ORAL
  Filled 2014-08-11: qty 1

## 2014-08-11 MED ORDER — FERROUS SULFATE 325 (65 FE) MG PO TABS
325.0000 mg | ORAL_TABLET | Freq: Every day | ORAL | Status: DC
  Administered 2014-08-11 – 2014-08-16 (×6): 325 mg via ORAL
  Filled 2014-08-11 (×7): qty 1

## 2014-08-11 MED ORDER — ACETAMINOPHEN 325 MG PO TABS
650.0000 mg | ORAL_TABLET | Freq: Once | ORAL | Status: AC
  Administered 2014-08-11: 650 mg via ORAL
  Filled 2014-08-11: qty 2

## 2014-08-11 MED ORDER — SODIUM CHLORIDE 0.9 % IJ SOLN: 3.0000 mL | INTRAMUSCULAR | Status: DC | PRN

## 2014-08-11 MED ORDER — SODIUM CHLORIDE 0.9 % IV SOLN: 250.0000 mL | INTRAVENOUS | Status: DC | PRN

## 2014-08-11 MED ORDER — INSULIN DETEMIR 100 UNIT/ML ~~LOC~~ SOLN
40.0000 [IU] | Freq: Every day | SUBCUTANEOUS | Status: DC
  Administered 2014-08-11 – 2014-08-14 (×4): 40 [IU] via SUBCUTANEOUS
  Filled 2014-08-11 (×5): qty 0.4

## 2014-08-11 MED ORDER — PREDNISONE 50 MG PO TABS
60.0000 mg | ORAL_TABLET | Freq: Every day | ORAL | Status: DC
  Administered 2014-08-11: 60 mg via ORAL
  Filled 2014-08-11 (×4): qty 1

## 2014-08-11 MED ORDER — SODIUM CHLORIDE 0.9 % IV SOLN
INTRAVENOUS | Status: DC
  Administered 2014-08-12: 10 mL/h via INTRAVENOUS

## 2014-08-11 NOTE — Progress Notes (Signed)
Patient complaining of a headache.  Paged Mallory, Intern for IM.  Will await PRN orders.  Currently none for pain/headaches.

## 2014-08-11 NOTE — Evaluation (Addendum)
Physical Therapy Evaluation Patient Details Name: Melody Gray MRN: [id number] DOB: 09-19-1970 Today's Date: 08/11/2014   History of Present Illness  Patient is a 44 year old woman with history of congestive heart failure, CKD, sarcoidosis, hypertension, diabetes, obesity hypoventilation syndrome status post tracheostomy, and other problems as outlined in the medical history, admitted with complaint of progressively worsening shortness of breath and decreased urine output   Clinical Impression  Pt is very pleasant and moving well but in need of external support for balance and stability with gait at this time. Pt with sats 92-96% on 55% trach collar with activity. Pt with acute confusion with difficulty processing questions and commands throughout session and able to recognize deficits with increased time and states AMS began yesterday, RN aware and states family reported similar instances with increased O2. Pt with decreased balance, mobility and activity tolerance who will benefit from acute therapy to maximize mobility, function and independence to decrease burden of care.     Follow Up Recommendations Home health PT    Equipment Recommendations  Rolling walker with 5" wheels    Recommendations for Other Services OT consult     Precautions / Restrictions Precautions Precautions: Fall      Mobility  Bed Mobility               General bed mobility comments: EOB on arrival  Transfers Overall transfer level: Needs assistance   Transfers: Sit to/from Stand           General transfer comment: cues for hand placement when RW present  Ambulation/Gait Ambulation/Gait assistance: Supervision Ambulation Distance (Feet): 200 Feet Assistive device: Rolling walker (2 wheeled) Gait Pattern/deviations: Step-through pattern;Decreased stride length;Trunk flexed   Gait velocity interpretation: Below normal speed for age/gender General Gait Details: Pt with initial 2' reaching  for wall and environmental support with use of RW for rest of gait pt with increased stride and stability and recognized need for DME  Stairs            Wheelchair Mobility    Modified Rankin (Stroke Patients Only)       Balance Overall balance assessment: Needs assistance   Sitting balance-Leahy Scale: Good       Standing balance-Leahy Scale: Poor                               Pertinent Vitals/Pain Pain Assessment: No/denies pain    Home Living Family/patient expects to be discharged to:: Private residence Living Arrangements: Children Available Help at Discharge: Family;Available PRN/intermittently Type of Home: House Home Access: Level entry     Home Layout: One level Home Equipment: Crutches;Cane - single point Additional Comments: 2L/min supplemental O2 at home    Prior Function Level of Independence: Independent         Comments: on disability, uses motorized w/c in grocery, can perform meal prep and housekeeping, but her children and boyfriend tend to do it for her     Hand Dominance        Extremity/Trunk Assessment   Upper Extremity Assessment: Generalized weakness           Lower Extremity Assessment: Generalized weakness      Cervical / Trunk Assessment: Normal;Other exceptions  Communication   Communication: No difficulties  Cognition Arousal/Alertness: Awake/alert Behavior During Therapy: WFL for tasks assessed/performed Overall Cognitive Status: Impaired/Different from baseline Area of Impairment: Problem solving;Orientation Orientation Level: Time   Memory: Decreased short-term memory  Problem Solving: Slow processing General Comments: pt not oriented to day, getting stuck on questions and unable to process commands and questions consistently. Asked pt day of the week and she answered "april " 3x before stating monday when it is actually Tuesday    General Comments      Exercises         Assessment/Plan    PT Assessment Patient needs continued PT services  PT Diagnosis Altered mental status;Difficulty walking;Generalized weakness   PT Problem List Decreased strength;Decreased activity tolerance;Decreased balance;Decreased mobility;Decreased knowledge of use of DME  PT Treatment Interventions Gait training;Functional mobility training;Therapeutic activities;DME instruction;Balance training;Patient/family education   PT Goals (Current goals can be found in the Care Plan section) Acute Rehab PT Goals Patient Stated Goal: be able to go home PT Goal Formulation: With patient Time For Goal Achievement: 08/25/14 Potential to Achieve Goals: Good    Frequency Min 3X/week   Barriers to discharge Decreased caregiver support      Co-evaluation               End of Session Equipment Utilized During Treatment: Oxygen Activity Tolerance: Patient tolerated treatment well Patient left: in chair;with call bell/phone within reach Nurse Communication: Mobility status         Time: [id number] PT Time Calculation (min) (ACUTE ONLY): 21 min   Charges:   PT Evaluation $Initial PT Evaluation Tier I: 1 Procedure     PT G CodesMelford Aase 08/11/2014, 12:31 PM Elwyn Reach, [place]

## 2014-08-11 NOTE — Consult Note (Signed)
Primary cardiologist: BC  HPI: Evaluate CHF. Echocardiogram in June of 2012 revealed an ejection fraction of 35% which was new. There was mild biatrial enlargement, mild right ventricular enlargement and mildly reduced RV function. Cardiac MRI in July of 2012 revealed moderate to severe LV systolic dysfunction, EF 70%. The anterior and anterolateral walls appeared severely hypokinetic. Mild RV dilation with mild systolic dysfunction. Possible small areas of mid wall delayed enhancement in the apical septal and apical lateral walls. These areas were very faint and not definitely of clinical significance. Mid wall enhancement can be found in infiltrative cardiomyopathies such as that due to sarcoidosis. Cardiac catheterization repeated in September of 2012 and showed an ejection fraction of 40%. There was no coronary disease. Echocardiogram repeated this admission and showed ejection fraction 55-60%, mild left atrial enlargement, mild right atrial enlargement, moderate right ventricular enlargement and moderate tricuspid regurgitation. Severely elevated pulmonary pressures. Patient admitted with one-week history of increased dyspnea on exertion. She denies orthopnea, PND, increased pedal edema, chest pain or syncope. No fevers, chills or productive cough. Her renal function had deteriorated and her Lasix has been held. She states she is feeling somewhat better. Cardiology asked to evaluate.  Medications Prior to Admission  Medication Sig Dispense Refill  . albuterol (PROVENTIL HFA;VENTOLIN HFA) 108 (90 BASE) MCG/ACT inhaler Inhale 2 puffs into the lungs every 6 (six) hours as needed for wheezing or shortness of breath. 1 Inhaler 2  . amLODipine (NORVASC) 5 MG tablet take 1 tablet by mouth once daily 30 tablet 5  . atorvastatin (LIPITOR) 20 MG tablet Take 1 tablet (20 mg total) by mouth daily at 6 PM. 90 tablet 3  . carvedilol (COREG) 12.5 MG tablet Take 1 tablet (12.5 mg total) by mouth 2 (two) times  daily. 180 tablet 3  . famotidine (PEPCID) 20 MG tablet Take 20 mg by mouth daily.  1  . furosemide (LASIX) 40 MG tablet Take 40 mg by mouth daily.    . insulin aspart (NOVOLOG) 100 UNIT/ML injection Take novolog per sliding scale: CBG 70-120: 0 units  CBG 121-150: 1 unit  CBG 151-200: 2 units  CBG 201-250: 3 units  CBG 251-300: 5 units  CBG 301-350: 7 units  CBG 351-400: 9 units  CBG > 400: call MD 30 mL 3  . insulin detemir (LEVEMIR) 100 UNIT/ML injection 28 units every morning and 20 units every night. diag code 250.00    . irbesartan (AVAPRO) 150 MG tablet Take 1 tablet (150 mg total) by mouth daily. 30 tablet 3  . Melatonin 3 MG TABS Take 1 tablet (3 mg total) by mouth at bedtime as needed.  0  . metFORMIN (GLUCOPHAGE) 1000 MG tablet take 1 tablet by mouth twice a day with meals 180 tablet 4  . B-D INS SYRINGE 0.5CC/30GX1/2" 30G X 1/2" 0.5 ML MISC USE AS DIRECTED 100 each 1  . ferrous sulfate 325 (65 FE) MG tablet Take 1 tablet (325 mg total) by mouth 3 (three) times daily with meals. (Patient not taking: Reported on 07/28/2014) 90 tablet 3  . fluticasone (FLONASE) 50 MCG/ACT nasal spray Place 2 sprays into both nostrils daily. (Patient not taking: Reported on 08/07/2014) 16 g 2    Allergies  Allergen Reactions  . Vicodin [Hydrocodone-Acetaminophen] Itching    Past Medical History  Diagnosis Date  . Sarcoidosis     Followed by Dr. Melvyn Novas; w/ liver involvement per biopsy 12/09, Reversible airway component so started on [facility] 01/2010; HFA 75% p  coaching 05/2010  . Hypoxemia     CT angiogram 9/11>> No PE; PFTs 10/11- FEV1 1.20 (49%) with 16% better p B2, DLCO 33%> corrects to 84; O2 sats ok on 4 lpm X rapid walk X 3 laps 05/2010  . Morbid obesity     Target wt= 153 for BMI <30  . QT prolongation   . Hypertension   . Hx of cardiac cath 2/08    No CAD, no RAS,  normal EF  . Seborrheic dermatitis of scalp   . 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 Hep B  serology neg. but Hep C ab +, HIV neg. 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 Cardiomyopathy, nonischemic     EF 45% 12/10; Echo 7/11 normal EF, PAS 48  . Diabetic retinopathy     Right eye 2/11  . Health maintenance examination     Mammogram 05/2010 Negative; Last Pap smear 03/2008; Last DM eye exam 2/11> mild non-proliferative diabetic retinopathy. OD  . Helicobacter pylori ab+ 05/2011    Pt was symptomatic and treatment planned for 05/2011  . CHF (congestive heart failure)   . Complication of anesthesia     " difficult waking "  . Sleep apnea   . Diabetes mellitus     insulin dependent  . Acute bronchitis 07/29/2014    Past Surgical History  Procedure Laterality Date  . Tubal ligation    . Breast surgery    . Cesarean section    . Tracheostomy tube placement N/A 08/10/2013    Procedure: TRACHEOSTOMY;  Surgeon: Melissa Montane, MD;  Location: [place];  Service: ENT;  Laterality: N/A;    History   Social History  . Marital Status: Single    Spouse Name: N/A  . Number of Children: 2  . Years of Education: N/A   Occupational History  . 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 Smoking status: Current Every Day Smoker -- 0.25 packs/day for 20 years    Types: Cigarettes  . Smokeless tobacco: Never Used     Comment: Needs to cut back.  . Alcohol Use: No  . Drug Use: No  . Sexual Activity: Not on file   Other Topics Concern  . Not on file   Social History Narrative   Diabetic card given 05/03/2010.   Financial assistance approved for 100% discount at [facility] and has [facility] card. [name] 12/07/2009.      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    Family History  Problem Relation Age of Onset  . Cancer Mother     colon cancer  . Multiple sclerosis Father   . Asthma Sister     in childhood  . Diabetes Father   . Hypertension      ROS:  no fevers or chills, productive  cough, hemoptysis, dysphasia, odynophagia, melena, hematochezia, dysuria, hematuria, rash, seizure activity, orthopnea, PND, claudication. Remaining systems are negative.  Physical Exam:   Blood pressure 129/78, pulse 108, temperature 99.3 F (37.4 C), temperature source Oral, resp. rate 20, height '5\' 1"'  (1.549 m), weight 254 lb 8 oz (115.44 kg), last menstrual period 07/25/2014, SpO2 95 %.  General:  Well developed/obese, chronically ill appearing in NAD Skin warm/dry Patient not depressed Back-normal HEENT-normal/normal eyelids Neck supple/s/p tracheostomy chest - CTA/ normal expansion CV -  RRR/normal S1 and S2; positive gallop Abdomen -obese; mild diffused tenderness, no mass, + bowel sounds, no bruit Ext-1+ edema, no chords, 2+ DP Neuro-grossly nonfocal  ECG sinus rhythm, right axis deviation.  Results for orders placed or performed during the hospital encounter of 08/08/14 (from the past 48 hour(s))  Glucose, capillary     Status: Abnormal   Collection Time: 08/09/14 11:43 AM  Result Value Ref Range   Glucose-Capillary 227 (H) 70 - 99 mg/dL   Comment 1 Notify RN    Comment 2 Document in Chart   Urea nitrogen, urine     Status: None   Collection Time: 08/09/14 12:55 PM  Result Value Ref Range   Urea Nitrogen, Ur 170 Not Estab. mg/dL    Comment: (NOTE) Performed At: [facility] [place], [place] [id number] Lindon Romp MD QP:[id number]   Urinalysis, Routine w reflex microscopic     Status: Abnormal   Collection Time: 08/09/14 12:55 PM  Result Value Ref Range   Color, Urine AMBER (A) YELLOW    Comment: BIOCHEMICALS MAY BE AFFECTED BY COLOR   APPearance TURBID (A) CLEAR   Specific Gravity, Urine 1.019 1.005 - 1.030   pH 5.0 5.0 - 8.0   Glucose, UA NEGATIVE NEGATIVE mg/dL   Hgb urine dipstick LARGE (A) NEGATIVE   Bilirubin Urine SMALL (A) NEGATIVE   Ketones, ur NEGATIVE NEGATIVE mg/dL   Protein, ur 100 (A) NEGATIVE mg/dL   Urobilinogen, UA  1.0 0.0 - 1.0 mg/dL   Nitrite POSITIVE (A) NEGATIVE   Leukocytes, UA LARGE (A) NEGATIVE  Urine microscopic-add on     Status: Abnormal   Collection Time: 08/09/14 12:55 PM  Result Value Ref Range   WBC, UA 21-50 <3 WBC/hpf   RBC / HPF 3-6 <3 RBC/hpf   Bacteria, UA MANY (A) RARE   Urine-Other MICROSCOPIC EXAM PERFORMED ON UNCONCENTRATED URINE   Culture, Urine     Status: None (Preliminary result)   Collection Time: 08/09/14 12:55 PM  Result Value Ref Range   Specimen Description URINE, RANDOM    Special Requests NONE    Colony Count      >=100,000 COLONIES/ML Performed at [place] Performed at [facility]    Report Status PENDING   Magnesium     Status: Abnormal   Collection Time: 08/09/14  4:00 PM  Result Value Ref Range   Magnesium 1.4 (L) 1.5 - 2.5 mg/dL  Glucose, capillary     Status: Abnormal   Collection Time: 08/09/14  4:26 PM  Result Value Ref Range   Glucose-Capillary 252 (H) 70 - 99 mg/dL   Comment 1 Notify RN    Comment 2 Document in Chart   Glucose, capillary     Status: Abnormal   Collection Time: 08/09/14  8:46 PM  Result Value Ref Range   Glucose-Capillary 186 (H) 70 - 99 mg/dL  Creatinine, urine, random     Status: None   Collection Time: 08/09/14 10:17 PM  Result Value Ref Range   Creatinine, Urine 287.08 mg/dL  Sodium, urine, random     Status: None   Collection Time: 08/09/14 10:17 PM  Result Value Ref Range   Sodium, Ur 47 mmol/L  CBC     Status: Abnormal   Collection Time: 08/10/14  4:05 AM  Result Value Ref Range   WBC 9.8 4.0 - 10.5 K/uL   RBC 3.54 (L) 3.87 -  5.11 MIL/uL   Hemoglobin 10.3 (L) 12.0 - 15.0 g/dL   HCT 35.5 (L) 36.0 - 46.0 %   MCV 100.3 (H) 78.0 - 100.0 fL   MCH 29.1 26.0 - 34.0 pg   MCHC 29.0 (L) 30.0 - 36.0 g/dL   RDW 16.1 (H) 11.5 - 15.5 %   Platelets 251 150 - 400 K/uL  Basic metabolic panel     Status: Abnormal   Collection Time: 08/10/14  4:05 AM  Result Value Ref  Range   Sodium 136 135 - 145 mmol/L   Potassium 6.1 (HH) 3.5 - 5.1 mmol/L    Comment: NO VISIBLE HEMOLYSIS REPEATED TO VERIFY CRITICAL RESULT CALLED TO, READ BACK BY AND VERIFIED WITH: M.FURTRELL RN [id number] 08/10/14 E.GADDY    Chloride 97 96 - 112 mmol/L   CO2 32 19 - 32 mmol/L   Glucose, Bld 242 (H) 70 - 99 mg/dL   BUN 64 (H) 6 - 23 mg/dL   Creatinine, Ser 4.75 (H) 0.50 - 1.10 mg/dL   Calcium 7.7 (L) 8.4 - 10.5 mg/dL   GFR calc non Af Amer 10 (L) >90 mL/min   GFR calc Af Amer 12 (L) >90 mL/min    Comment: (NOTE) The eGFR has been calculated using the CKD EPI equation. This calculation has not been validated in all clinical situations. eGFR's persistently <90 mL/min signify possible Chronic Kidney Disease.    Anion gap 7 5 - 15  Glucose, capillary     Status: Abnormal   Collection Time: 08/10/14  5:52 AM  Result Value Ref Range   Glucose-Capillary 205 (H) 70 - 99 mg/dL   Comment 1 Notify RN    Comment 2 Document in Chart   Glucose, capillary     Status: Abnormal   Collection Time: 08/10/14  8:19 AM  Result Value Ref Range   Glucose-Capillary 213 (H) 70 - 99 mg/dL   Comment 1 Notify RN    Comment 2 Document in Chart   Glucose, capillary     Status: Abnormal   Collection Time: 08/10/14 11:58 AM  Result Value Ref Range   Glucose-Capillary 287 (H) 70 - 99 mg/dL   Comment 1 Notify RN   Basic metabolic panel     Status: Abnormal   Collection Time: 08/10/14 12:20 PM  Result Value Ref Range   Sodium 136 135 - 145 mmol/L   Potassium 5.5 (H) 3.5 - 5.1 mmol/L   Chloride 97 96 - 112 mmol/L   CO2 25 19 - 32 mmol/L   Glucose, Bld 292 (H) 70 - 99 mg/dL   BUN 64 (H) 6 - 23 mg/dL   Creatinine, Ser 3.90 (H) 0.50 - 1.10 mg/dL   Calcium 8.2 (L) 8.4 - 10.5 mg/dL   GFR calc non Af Amer 13 (L) >90 mL/min   GFR calc Af Amer 15 (L) >90 mL/min    Comment: (NOTE) The eGFR has been calculated using the CKD EPI equation. This calculation has not been validated in all clinical  situations. eGFR's persistently <90 mL/min signify possible Chronic Kidney Disease.    Anion gap 14 5 - 15  Glucose, capillary     Status: Abnormal   Collection Time: 08/10/14  4:51 PM  Result Value Ref Range   Glucose-Capillary 333 (H) 70 - 99 mg/dL   Comment 1 Notify RN   Basic metabolic panel     Status: Abnormal   Collection Time: 08/10/14  6:20 PM  Result Value Ref Range   Sodium 135  135 - 145 mmol/L   Potassium 5.2 (H) 3.5 - 5.1 mmol/L   Chloride 96 96 - 112 mmol/L   CO2 30 19 - 32 mmol/L   Glucose, Bld 356 (H) 70 - 99 mg/dL   BUN 61 (H) 6 - 23 mg/dL   Creatinine, Ser 3.45 (H) 0.50 - 1.10 mg/dL   Calcium 8.2 (L) 8.4 - 10.5 mg/dL   GFR calc non Af Amer 15 (L) >90 mL/min   GFR calc Af Amer 17 (L) >90 mL/min    Comment: (NOTE) The eGFR has been calculated using the CKD EPI equation. This calculation has not been validated in all clinical situations. eGFR's persistently <90 mL/min signify possible Chronic Kidney Disease.    Anion gap 9 5 - 15  Glucose, capillary     Status: Abnormal   Collection Time: 08/10/14 10:06 PM  Result Value Ref Range   Glucose-Capillary 246 (H) 70 - 99 mg/dL  Basic metabolic panel     Status: Abnormal   Collection Time: 08/11/14  5:10 AM  Result Value Ref Range   Sodium 142 135 - 145 mmol/L    Comment: DELTA CHECK NOTED   Potassium 4.9 3.5 - 5.1 mmol/L   Chloride 101 96 - 112 mmol/L   CO2 34 (H) 19 - 32 mmol/L   Glucose, Bld 210 (H) 70 - 99 mg/dL   BUN 51 (H) 6 - 23 mg/dL   Creatinine, Ser 2.27 (H) 0.50 - 1.10 mg/dL    Comment: DELTA CHECK NOTED   Calcium 8.3 (L) 8.4 - 10.5 mg/dL   GFR calc non Af Amer 25 (L) >90 mL/min   GFR calc Af Amer 29 (L) >90 mL/min    Comment: (NOTE) The eGFR has been calculated using the CKD EPI equation. This calculation has not been validated in all clinical situations. eGFR's persistently <90 mL/min signify possible Chronic Kidney Disease.    Anion gap 7 5 - 15  CBC     Status: Abnormal   Collection  Time: 08/11/14  5:10 AM  Result Value Ref Range   WBC 9.0 4.0 - 10.5 K/uL   RBC 3.76 (L) 3.87 - 5.11 MIL/uL   Hemoglobin 10.9 (L) 12.0 - 15.0 g/dL   HCT 37.9 36.0 - 46.0 %   MCV 100.8 (H) 78.0 - 100.0 fL   MCH 29.0 26.0 - 34.0 pg   MCHC 28.8 (L) 30.0 - 36.0 g/dL   RDW 15.9 (H) 11.5 - 15.5 %   Platelets 229 150 - 400 K/uL  Glucose, capillary     Status: Abnormal   Collection Time: 08/11/14  6:04 AM  Result Value Ref Range   Glucose-Capillary 189 (H) 70 - 99 mg/dL    US Renal  08/09/2014   CLINICAL DATA:  44 year old female with acute renal failure.  EXAM: RENAL/URINARY TRACT ULTRASOUND COMPLETE  COMPARISON:  None.  FINDINGS: Right Kidney:  Length: 11.4 cm. Echogenicity within normal limits. No mass or hydronephrosis visualized.  Left Kidney:  Length: 11.2 cm. Echogenicity within normal limits. No mass or hydronephrosis visualized.  Bladder:  Appears normal for degree of bladder distention.  IMPRESSION: Normal renal ultrasound.  No evidence of hydronephrosis.   Electronically Signed   By: Margarette Canada M.D.   On: 08/09/2014 16:08    Assessment/Plan 1 right heart failure-difficult situation. She has sarcoidosis, obstructive sleep apnea, and obesity hypoventilation syndrome contributing to her pulmonary hypertension/right heart failure. Unclear how much pulmonary venous hypertension is contributing. Her renal function was acutely worse.  Her diuretics have been held. I will plan to schedule a right heart catheterization tomorrow morning to assess her pulmonary pressures and PCWP; this will help [name] manage diuretics. 2 pulmonary hypertension-as above most likely secondary to sarcoidosis, obstructive sleep apnea and obesity hypoventilation syndrome. Tracheostomy is in place. 3 acute on chronic renal failure-possibly related to overdiuresis. Probable contribution from decreased perfusion. Nephrology following. Diuretics on hold. Right heart catheterization tomorrow morning. Agree with holding ARB. 4  obesity hypoventilation syndrome-needs weight loss. 5 hypertension-blood pressure has been borderline. Blood pressure medications are on hold. Follow and adjust as needed. 6 sarcoid-continue steroids.  Kirk Ruths MD 08/11/2014, 10:01 AM

## 2014-08-11 NOTE — Progress Notes (Signed)
Inpatient Diabetes Program Recommendations  AACE/ADA: New Consensus Statement on Inpatient Glycemic Control (2013)  Target Ranges:  Prepandial:   less than 140 mg/dL      Peak postprandial:   less than 180 mg/dL (1-2 hours)      Critically ill patients:  140 - 180 mg/dL   Reason for Visit: Hyperglycemia  Results for Melody Gray, Melody Gray (MRN [id number]) as of 08/11/2014 09:04  Ref. Range 08/10/2014 08:19 08/10/2014 11:58 08/10/2014 16:51 08/10/2014 22:06 08/11/2014 06:04  Glucose-Capillary Latest Range: 70-99 mg/dL 213 (H) 287 (H) 333 (H) 246 (H) 189 (H)   Blood sugars continue to be elevated. Need tighter control.  Recommendations: Increase Levemir to 25 units in am and 20 units QHS. When po intake increases, would likely benefit from meal coverage insulin.  Will continue to follow. Thank you. Lorenda Peck, RD, LDN, CDE Inpatient Diabetes Coordinator [phone]

## 2014-08-11 NOTE — Progress Notes (Signed)
Subjective:    She reports continued improvement in her breathing this morning. She's had some good urine output, and she denies chest pain or palpitations. She continues to have lower extremity edema which is unchanged.  Interval Events: -Slightly tachycardic this morning, afebrile. -Urine culture growing Escherichia coli. -Echo with normal EF and diastolic function. Pulmonary artery pressure severely increased at 83 mmHg. -Cardiology consulted recommending right heart cath tomorrow.    Objective:    Vital Signs:   Temp:  [98.7 F (37.1 C)-99.3 F (37.4 C)] 99.3 F (37.4 C) (04/12 0512) Pulse Rate:  [98-108] 108 (04/12 0819) Resp:  [18-22] 20 (04/12 0819) BP: (129-144)/(78-89) 129/78 mmHg (04/12 0512) SpO2:  [90 %-100 %] 95 % (04/12 0819) FiO2 (%):  [40 %-80 %] 40 % (04/12 0819) Weight:  [254 lb 8 oz (115.44 kg)] 254 lb 8 oz (115.44 kg) (04/12 0512) Last BM Date: 08/10/14  24-hour weight change: Weight change: 2 lb 12.8 oz (1.27 kg)  Intake/Output:   Intake/Output Summary (Last 24 hours) at 08/11/14 1118 Last data filed at 08/11/14 1013  Gross per 24 hour  Intake 2541.33 ml  Output   2903 ml  Net -361.67 ml      Physical Exam: General: Well-developed, well-nourished, in no acute distress, appropriate and cooperative throughout examination.  Trach collar in place.  Lungs:  Normal work of breathing, mild rhonchi bilaterally.   Heart: RRR. S1 and S2 normal without gallop, murmur, or rubs.  Abdomen:  BS normoactive. Soft, Nondistended, non-tender.  No masses or organomegaly.  Extremities: 1+ pitting edema bilaterally, stable.     Labs:  Basic Metabolic Panel:  Recent Labs Lab 08/07/14 1653  08/09/14 0540 08/09/14 1600 08/10/14 0405 08/10/14 1220 08/10/14 1820 08/11/14 0510  NA 137  < > 136  --  136 136 135 142  K 5.8*  < > 5.0  --  6.1* 5.5* 5.2* 4.9  CL 96  < > 96  --  97 97 96 101  CO2 30  < > 23  --  32 25 30 34*  GLUCOSE 233*  < > 226*  --  242*  292* 356* 210*  BUN 42*  < > 55*  --  64* 64* 61* 51*  CREATININE 2.69*  < > 3.63*  --  4.75* 3.90* 3.45* 2.27*  CALCIUM 8.7  < > 7.9*  --  7.7* 8.2* 8.2* 8.3*  MG 1.2*  --   --  1.4*  --   --   --   --   < > = values in this interval not displayed.  Liver Function Tests:  Recent Labs Lab 08/08/14 2323  AST 18  ALT 15  ALKPHOS 87  BILITOT 0.9  PROT 7.1  ALBUMIN 3.3*   CBC:  Recent Labs Lab 08/08/14 2323 08/09/14 0540 08/10/14 0405 08/11/14 0510  WBC 10.4 10.7* 9.8 9.0  NEUTROABS 7.1  --   --   --   HGB 11.5* 10.9* 10.3* 10.9*  HCT 38.2 37.0 35.5* 37.9  MCV 97.2 98.9 100.3* 100.8*  PLT 268 256 251 229    Cardiac Enzymes:  Recent Labs Lab 08/08/14 2323  TROPONINI <0.03   CBG:  Recent Labs Lab 08/10/14 0819 08/10/14 1158 08/10/14 1651 08/10/14 2206 08/11/14 0604  GLUCAP 213* 287* 333* 246* 189*    Microbiology: Results for orders placed or performed during the hospital encounter of 08/08/14  Culture, Urine     Status: None (Preliminary result)   Collection Time: 08/09/14  12:55 PM  Result Value Ref Range Status   Specimen Description URINE, RANDOM  Final   Special Requests NONE  Final   Colony Count   Final    >=100,000 COLONIES/ML Performed at [facility]    Culture   Final    ESCHERICHIA COLI Performed at [facility]    Report Status PENDING  Incomplete   Imaging: US Renal  08/09/2014   CLINICAL DATA:  44 year old female with acute renal failure.  EXAM: RENAL/URINARY TRACT ULTRASOUND COMPLETE  COMPARISON:  None.  FINDINGS: Right Kidney:  Length: 11.4 cm. Echogenicity within normal limits. No mass or hydronephrosis visualized.  Left Kidney:  Length: 11.2 cm. Echogenicity within normal limits. No mass or hydronephrosis visualized.  Bladder:  Appears normal for degree of bladder distention.  IMPRESSION: Normal renal ultrasound.  No evidence of hydronephrosis.   Electronically Signed   By: Margarette Canada M.D.   On: 08/09/2014 16:08        Medications:    Infusions:    Scheduled Medications: . atorvastatin  20 mg Oral q1800  . budesonide (PULMICORT) nebulizer solution  0.25 mg Nebulization BID  . cefTRIAXone (ROCEPHIN)  IV  2 g Intravenous Q24H  . docusate sodium  100 mg Oral BID  . famotidine  20 mg Oral Daily  . ferrous sulfate  325 mg Oral Q breakfast  . heparin  5,000 Units Subcutaneous 3 times per day  . insulin aspart  0-20 Units Subcutaneous TID WC  . insulin aspart  0-5 Units Subcutaneous QHS  . insulin detemir  40 Units Subcutaneous QHS  . ipratropium-albuterol  3 mL Nebulization Q6H  . predniSONE  60 mg Oral Q breakfast  . sodium chloride  3 mL Intravenous Q12H    PRN Medications:    Assessment/ Plan:    Active Problems:   Acute on chronic diastolic CHF (congestive heart failure)  #Acute on chronic kidney disease Creatinine significantly improved to 2.27 this morning with good urine output yesterday. Discussed the possibility that sarcoid disease could be contributing to her chronic kidney disease with Dr. Florene Glen of nephrology. He agrees that this is a possibility given her systemic sarcoid and intermittent proteinuria. However, he does not feel that she would benefit from a biopsy due to the systemic nature of her sarcoid. Steroid dosing should be focused on controlling her sarcoid pulmonary and heart disease. Acute kidney failure most likely a combination of cardiorenal and overdiuresis. -Nephrology following. -Stop normal saline this morning given improving creatinine and right heart failure on echo. -Heart healthy/Carb modified diet. -Nothing by mouth for right heart cath tomorrow. -Foley catheter per nephrology to monitor urine output. -Recheck creatinine in the morning. -Continue to hold diuretics.  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 Cardiology has seen the patient and recommends right heart cath to assess pulmonary pressures and PCWP to help manage diuretics. She is on steroids, which may provide some relief. Unclear if she would benefit from additional therapies to treat her pulmonary venous hypertension. No wall motion abnormalities on echo yesterday to suggest cardiac sarcoidosis. -Appreciate cardiology recommendations. -Right heart catheterization tomorrow. -Prednisone 60 mg daily. -Continue to hold Lasix. -Continue strict ins and outs. -Daily weights. -PT and OT evaluation.  #Hyperkalemia Improved this morning with treatment and improving renal function. -Recheck BMP tomorrow morning.  #Urinary tract infection Urine culture growing Escherichia coli. -Continue ceftriaxone IV, day 3. -  Follow-up urine culture sensitivities.  #Obesity hypoventilation syndrome -Continue oxygen supplement vision via trach collar as needed and keep oxygen saturations greater than 92%. -Continue DuoNeb's every 6 hours. -Continue Pulmicort nebulization twice a day.  #Hypertension Blood pressure remains low off of home pressure meds in the setting of right heart failure. -Continue to hold home Coreg 12.5 mg twice a day, amlodipine 5 mg daily, Lasix 40 mg daily, and irbesartan 150 mg daily.  #Type 2 diabetes On twice a day dosing at home for unclear reason. We'll switch to once daily dosing to increase compliance. -Schedule Levemir 40 units daily at bedtime. -Increase to sliding scale insulin resistant.  #Hyperlipidemia -Continue home atorvastatin 20 mg daily.   DVT PPX - heparin  CODE STATUS - Full.  CONSULTS PLACED - Nephrology.  DISPO - Disposition is deferred at this time, awaiting improvement of right heart failure.   Anticipated discharge in approximately 2-4 day(s).   The patient does have a current PCP Jerene Pitch, MD) and does need an [facility] hospital follow-up appointment after  discharge.    Is the [facility] hospital follow-up appointment a one-time only appointment? no.  Does the patient have transportation limitations that hinder transportation to clinic appointments? yes   SERVICE NEEDED AT [place]         Y = Yes, Blank = No PT:   OT:   RN:   Equipment:   Other:      Length of Stay: 2 day(s)   Signed: Charlesetta Shanks, MD  PGY-1, Internal Medicine Resident Pager: [phone] (7AM-5PM) 08/11/2014, 11:18 AM

## 2014-08-11 NOTE — Care Management Note (Unsigned)
    Page 1 of 1   08/14/2014     4:46:28 PM CARE MANAGEMENT NOTE 08/14/2014  Patient:  Melody Gray, Melody Gray   Account Number:  [id number]  Date Initiated:  08/11/2014  Documentation initiated by:  Horton Ellithorpe  Subjective/Objective Assessment:   Pt adm on 08/08/14 with CHF exacerbation.  PTA, pt independent, lives with daughter.     Action/Plan:   Will follow for dc needs as pt progresses.   Anticipated DC Date:  08/16/2014   Anticipated DC Plan:  [place]  CM consult      Choice offered to / List presented to:             [place]   Status of service:  In process, will continue to follow Medicare Important Message given?  YES (If response is "NO", the following Medicare IM given date fields will be blank) Date Medicare IM given:  08/11/2014 Medicare IM given by:  Chipper Koudelka Date Additional Medicare IM given:  08/13/2014 Additional Medicare IM given by:  Ellan Lambert  Discharge Disposition:  [place]  Per UR Regulation:  Reviewed for med. necessity/level of care/duration of stay  If discussed at [place] of Stay Meetings, dates discussed:    Comments:  08/14/14 Ellan Lambert, RN, BSN [phone] Pt for likely dc over the weekend.  Would recommend HHRN, PT, OT and aide.  Also need tub transfer bench.  MD, please leave orders and complete face to face documentation.

## 2014-08-11 NOTE — Progress Notes (Signed)
Expand All Collapse All   Assessment: 1. 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 Morbid obesity 5. DM2 6. Systemic arterial HTN - now low 7. Pulmonary hypertension 8. E. Coli UTI  Rec: Not sure it makes sense to restart ARB with recurrent AKI history           I would agree with sliding scale diuretics as prudent diuretic management with "dry weight" around where it is now           There is no good solution to the cardiorenal issues            I would not assume sarcoid nephropathy or base steroid management on renal function or recommend renal biopsy            She will need furosemide resumed and close follow up upon discharge  We will sign off.      Subjective: Interval History: good UOP  Objective: Vital signs in last 24 hours: Temp:  [98.7 F (37.1 C)-99.3 F (37.4 C)] 99.3 F (37.4 C) (04/12 0512) Pulse Rate:  [98-108] 100 (04/12 1120) Resp:  [18-22] 20 (04/12 0819) BP: (129-144)/(78-89) 129/78 mmHg (04/12 0512) SpO2:  [90 %-100 %] 93 % (04/12 1120) FiO2 (%):  [40 %-80 %] 40 % (04/12 1120) Weight:  [115.44 kg (254 lb 8 oz)] 115.44 kg (254 lb 8 oz) (04/12 0512) Weight change: 1.27 kg (2 lb 12.8 oz)  Intake/Output from previous day: 04/11 0701 - 04/12 0700 In: 2771.3 [P.O.:1320; I.V.:1291.3; IV Piggyback:160] Out: 2103 [Urine:2100; Emesis/NG output:2; Stool:1] Intake/Output this shift: Total I/O In: 240 [P.O.:240] Out: 800 [Urine:800]  General appearance: alert and cooperative Head: Normocephalic, without obvious abnormality, atraumatic Resp: rhonchi base - right Cardio: regular rate and rhythm, S1, S2 normal, no murmur, click, rub or gallop Extremities: edema 2+ pitting  Lab Results:  Recent Labs  08/10/14 0405 08/11/14 0510  WBC 9.8 9.0  HGB 10.3* 10.9*  HCT 35.5* 37.9  PLT 251 229   BMET:  Recent Labs   08/10/14 1820 08/11/14 0510  NA 135 142  K 5.2* 4.9  CL 96 101  CO2 30 34*  GLUCOSE 356* 210*  BUN 61* 51*  CREATININE 3.45* 2.27*  CALCIUM 8.2* 8.3*   No results for input(s): PTH in the last 72 hours. Iron Studies: No results for input(s): IRON, TIBC, TRANSFERRIN, FERRITIN in the last 72 hours. Studies/Results: US Renal  08/09/2014   CLINICAL DATA:  44 year old female with acute renal failure.  EXAM: RENAL/URINARY TRACT ULTRASOUND COMPLETE  COMPARISON:  None.  FINDINGS: Right Kidney:  Length: 11.4 cm. Echogenicity within normal limits. No mass or hydronephrosis visualized.  Left Kidney:  Length: 11.2 cm. Echogenicity within normal limits. No mass or hydronephrosis visualized.  Bladder:  Appears normal for degree of bladder distention.  IMPRESSION: Normal renal ultrasound.  No evidence of hydronephrosis.   Electronically Signed   By: Margarette Canada M.D.   On: 08/09/2014 16:08    Scheduled: . atorvastatin  20 mg Oral q1800  . budesonide (PULMICORT) nebulizer solution  0.25 mg Nebulization BID  . cefTRIAXone (ROCEPHIN)  IV  2 g Intravenous Q24H  . docusate sodium  100 mg Oral BID  . famotidine  20 mg Oral Daily  . ferrous sulfate  325 mg Oral Q breakfast  . heparin  5,000 Units Subcutaneous 3 times per  day  . insulin aspart  0-20 Units Subcutaneous TID WC  . insulin aspart  0-5 Units Subcutaneous QHS  . insulin detemir  40 Units Subcutaneous QHS  . ipratropium-albuterol  3 mL Nebulization Q6H  . predniSONE  60 mg Oral Q breakfast  . sodium chloride  3 mL Intravenous Q12H    LOS: 2 days   Faduma Cho C 08/11/2014,11:59 AM

## 2014-08-11 NOTE — Progress Notes (Signed)
Patient seen and examined. Case d/w residents in details. I agree with findings and plan as documented in Dr. Vivia Budge note.  Patient feels better this morning. Urine output has improved. Creatinine is improving. Likely secondary to overdiuresis. Would not resume ARB on discharge. IVF dc'd today  Patient also noted to gave pulmonary HTN on ECHO. Patient for RHC in AM to better assess PCWP and pulm pressures. Will help decide diuretic management.  C/w rocephin for E. Coli UTI. Will switch to PO abx in AM after following up sensitivities.

## 2014-08-12 ENCOUNTER — Encounter (HOSPITAL_COMMUNITY)
Admission: EM | Disposition: A | Discharge status: Home Health | Payer: Self-pay | Source: Home / Self Care | Attending: Internal Medicine

## 2014-08-12 ENCOUNTER — Encounter (HOSPITAL_COMMUNITY): Payer: Self-pay | Admitting: Internal Medicine

## 2014-08-12 DIAGNOSIS — I270 Primary pulmonary hypertension: Secondary | ICD-10-CM

## 2014-08-12 DIAGNOSIS — J810 Acute pulmonary edema: Secondary | ICD-10-CM | POA: Insufficient documentation

## 2014-08-12 DIAGNOSIS — N170 Acute kidney failure with tubular necrosis: Secondary | ICD-10-CM | POA: Insufficient documentation

## 2014-08-12 HISTORY — PX: RIGHT HEART CATHETERIZATION: SHX5447

## 2014-08-12 LAB — BLOOD GAS, ARTERIAL
Acid-Base Excess: 10.9 mmol/L — ABNORMAL HIGH (ref 0.0–2.0)
Bicarbonate: 36.4 mEq/L — ABNORMAL HIGH (ref 20.0–24.0)
FIO2: 0.6 %
O2 Saturation: 91.4 %
Patient temperature: 98.6
TCO2: 38.3 mmol/L (ref 0–100)
pCO2 arterial: 62.6 mmHg (ref 35.0–45.0)
pH, Arterial: 7.382 (ref 7.350–7.450)
pO2, Arterial: 61.5 mmHg — ABNORMAL LOW (ref 80.0–100.0)

## 2014-08-12 LAB — GLUCOSE, CAPILLARY
Glucose-Capillary: 222 mg/dL — ABNORMAL HIGH (ref 70–99)
Glucose-Capillary: 159 mg/dL — ABNORMAL HIGH (ref 70–99)
Glucose-Capillary: 142 mg/dL — ABNORMAL HIGH (ref 70–99)
Glucose-Capillary: 220 mg/dL — ABNORMAL HIGH (ref 70–99)

## 2014-08-12 LAB — BASIC METABOLIC PANEL
Anion gap: 8 (ref 5–15)
BUN: 28 mg/dL — ABNORMAL HIGH (ref 6–23)
CO2: 35 mmol/L — ABNORMAL HIGH (ref 19–32)
Calcium: 9.1 mg/dL (ref 8.4–10.5)
Chloride: 101 mmol/L (ref 96–112)
Creatinine, Ser: 1.34 mg/dL — ABNORMAL HIGH (ref 0.50–1.10)
GFR calc Af Amer: 54 mL/min — ABNORMAL LOW (ref >90)
GFR calc non Af Amer: 47 mL/min — ABNORMAL LOW (ref >90)
Glucose, Bld: 287 mg/dL — ABNORMAL HIGH (ref 70–99)
Potassium: 4.7 mmol/L (ref 3.5–5.1)
Sodium: 144 mmol/L (ref 135–145)

## 2014-08-12 LAB — CBC
HCT: 36.1 % (ref 36.0–46.0)
Hemoglobin: 10.7 g/dL — ABNORMAL LOW (ref 12.0–15.0)
MCH: 30.1 pg (ref 26.0–34.0)
MCHC: 29.6 g/dL — ABNORMAL LOW (ref 30.0–36.0)
MCV: 101.4 fL — ABNORMAL HIGH (ref 78.0–100.0)
Platelets: 217 10*3/uL (ref 150–400)
RBC: 3.56 MIL/uL — ABNORMAL LOW (ref 3.87–5.11)
RDW: 15.4 % (ref 11.5–15.5)
WBC: 5.9 10*3/uL (ref 4.0–10.5)

## 2014-08-12 LAB — CALCIUM, URINE, RANDOM: Calcium, Ur: <0.8 mg/dL

## 2014-08-12 LAB — PROTIME-INR
INR: 1.13 (ref 0.00–1.49)
Prothrombin Time: 14.6 seconds (ref 11.6–15.2)

## 2014-08-12 SURGERY — RIGHT HEART CATH
Anesthesia: LOCAL

## 2014-08-12 MED ORDER — ACETAMINOPHEN 325 MG PO TABS
650.0000 mg | ORAL_TABLET | Freq: Four times a day (QID) | ORAL | Status: DC | PRN
  Filled 2014-08-12: qty 2

## 2014-08-12 MED ORDER — TORSEMIDE 20 MG PO TABS
20.0000 mg | ORAL_TABLET | Freq: Two times a day (BID) | ORAL | Status: DC
  Administered 2014-08-13: 20 mg via ORAL
  Filled 2014-08-12 (×3): qty 1

## 2014-08-12 MED ORDER — HEPARIN (PORCINE) IN NACL 2-0.9 UNIT/ML-% IJ SOLN
INTRAMUSCULAR | Status: AC
Start: 2014-08-12 — End: 2014-08-12
  Filled 2014-08-12: qty 1000

## 2014-08-12 MED ORDER — FENTANYL CITRATE 0.05 MG/ML IJ SOLN
INTRAMUSCULAR | Status: AC
  Filled 2014-08-12: qty 2

## 2014-08-12 MED ORDER — MORPHINE SULFATE 2 MG/ML IJ SOLN
1.0000 mg | Freq: Once | INTRAMUSCULAR | Status: AC
Start: 2014-08-12 — End: 2014-08-12
  Administered 2014-08-12: 1 mg via INTRAVENOUS

## 2014-08-12 MED ORDER — LIDOCAINE HCL (PF) 1 % IJ SOLN
INTRAMUSCULAR | Status: AC
Start: 2014-08-12 — End: 2014-08-12
  Filled 2014-08-12: qty 30

## 2014-08-12 MED ORDER — LIDOCAINE HCL (PF) 1 % IJ SOLN
INTRAMUSCULAR | Status: AC
  Filled 2014-08-12: qty 30

## 2014-08-12 MED ORDER — PREDNISONE 10 MG PO TABS
10.0000 mg | ORAL_TABLET | Freq: Once | ORAL | Status: AC
  Administered 2014-08-12: 10 mg via ORAL
  Filled 2014-08-12 (×2): qty 1

## 2014-08-12 MED ORDER — MORPHINE SULFATE 2 MG/ML IJ SOLN
1.0000 mg | INTRAMUSCULAR | Status: AC | PRN
  Administered 2014-08-12 – 2014-08-13 (×2): 1 mg via INTRAVENOUS
  Filled 2014-08-12 (×2): qty 1

## 2014-08-12 MED ORDER — MORPHINE SULFATE 2 MG/ML IJ SOLN
INTRAMUSCULAR | Status: AC
  Filled 2014-08-12: qty 1

## 2014-08-12 NOTE — H&P (View-Only) (Signed)
Patient Name: Melody Gray Date of Encounter: 08/12/2014     Active Problems:   Acute on chronic diastolic CHF (congestive heart failure)    SUBJECTIVE  The patient is somnolent.  She awakens with stimulation but falls back to sleep easily.  She denies any chest pain or increased shortness of breath.  Her renal function has improved since Lasix was held.  She does not have any peripheral edema today.  She will be having right heart catheterization later this morning  CURRENT MEDS . atorvastatin  20 mg Oral q1800  . budesonide (PULMICORT) nebulizer solution  0.25 mg Nebulization BID  . cefTRIAXone (ROCEPHIN)  IV  2 g Intravenous Q24H  . docusate sodium  100 mg Oral BID  . famotidine  20 mg Oral Daily  . ferrous sulfate  325 mg Oral Q breakfast  . heparin  5,000 Units Subcutaneous 3 times per day  . insulin aspart  0-20 Units Subcutaneous TID WC  . insulin aspart  0-5 Units Subcutaneous QHS  . insulin detemir  40 Units Subcutaneous QHS  . ipratropium-albuterol  3 mL Nebulization Q6H  . predniSONE  60 mg Oral Q breakfast  . sodium chloride  3 mL Intravenous Q12H  . sodium chloride  3 mL Intravenous Q12H    OBJECTIVE  Filed Vitals:   08/12/14 0004 08/12/14 0305 08/12/14 0514 08/12/14 0907  BP:   146/86   Pulse: 93 89 100 83  Temp:   98.8 F (37.1 C)   TempSrc:   Oral   Resp: 20 20 20 21   Height:      Weight:   247 lb 12.8 oz (112.401 kg)   SpO2: 95% 96% 100% 97%    Intake/Output Summary (Last 24 hours) at 08/12/14 1022 Last data filed at 08/12/14 0747  Gross per 24 hour  Intake    460 ml  Output   3000 ml  Net  -2540 ml   Filed Weights   08/11/14 0512 08/11/14 1925 08/12/14 0514  Weight: 254 lb 8 oz (115.44 kg) 254 lb 8 oz (115.44 kg) 247 lb 12.8 oz (112.401 kg)    PHYSICAL EXAM  General: Pleasant, NAD. Neuro: Alert and oriented X 3. Moves all extremities spontaneously. Psych: Normal affect. HEENT:  Normal  Neck: Supple without bruits or JVD. Lungs:   Resp regular and unlabored, CTA. Heart: RRR no s3, s4, or murmurs. Abdomen: Soft, non-tender, non-distended, BS + x 4.  Extremities: No clubbing, cyanosis or edema. DP/PT/Radials 2+ and equal bilaterally.  Accessory Clinical Findings  CBC  Recent Labs  08/11/14 0510 08/12/14 0358  WBC 9.0 5.9  HGB 10.9* 10.7*  HCT 37.9 36.1  MCV 100.8* 101.4*  PLT 229 [id number]   Basic Metabolic Panel  Recent Labs  08/09/14 1600  08/11/14 0510 08/12/14 0358  NA  --   < > 142 144  K  --   < > 4.9 4.7  CL  --   < > 101 101  CO2  --   < > 34* 35*  GLUCOSE  --   < > 210* 287*  BUN  --   < > 51* 28*  CREATININE  --   < > 2.27* 1.34*  CALCIUM  --   < > 8.3* 9.1  MG 1.4*  --   --   --   < > = values in this interval not displayed. Liver Function Tests No results for input(s): AST, ALT, ALKPHOS, BILITOT, PROT, ALBUMIN in the last  72 hours. No results for input(s): LIPASE, AMYLASE in the last 72 hours. Cardiac Enzymes No results for input(s): CKTOTAL, CKMB, CKMBINDEX, TROPONINI in the last 72 hours. BNP Invalid input(s): POCBNP D-Dimer No results for input(s): DDIMER in the last 72 hours. Hemoglobin A1C No results for input(s): HGBA1C in the last 72 hours. Fasting Lipid Panel No results for input(s): CHOL, HDL, LDLCALC, TRIG, CHOLHDL, LDLDIRECT in the last 72 hours. Thyroid Function Tests No results for input(s): TSH, T4TOTAL, T3FREE, THYROIDAB in the last 72 hours.  Invalid input(s): FREET3  TELE  Normal sinus rhythm  ECG    Radiology/Studies  X-ray Chest Pa And Lateral  08/09/2014   CLINICAL DATA:  44 year old female with acute on chronic diastolic congestive heart failure. Shortness of Breath. Initial encounter.  EXAM: CHEST  2 VIEW  COMPARISON:  08/08/2014 and earlier.  FINDINGS: Lower lung volumes. Stable cardiomegaly and mediastinal contours. Tracheostomy remains in place. Mildly increased interstitial and streaky mid and lower lung opacity. No pneumothorax. No definite pleural  effusion. Stable visualized osseous structures.  IMPRESSION: Streaky and interstitial bilateral mid and lower lung opacity has mildly progressed. Differential considerations include increased atelectasis, mild interstitial edema, or less likely bilateral pneumonia. No pleural effusion identified.   Electronically Signed   By: Genevie Ann M.D.   On: 08/09/2014 09:39   Dg Chest 2 View  08/08/2014   CLINICAL DATA:  Cough and wheezing for 8 days.  History of sarcoid.  EXAM: CHEST  2 VIEW  COMPARISON:  07/28/2014 and prior chest radiographs dating back to 10/05/2010.  FINDINGS: Cardiomegaly and a tracheostomy tube again noted.  The cardiomediastinal silhouette is unchanged.  Interstitial opacities bilaterally and mild peribronchial thickening are unchanged.  There is no evidence of focal airspace disease, pulmonary edema, suspicious pulmonary nodule/mass, pleural effusion, or pneumothorax. No acute bony abnormalities are identified.  IMPRESSION: No evidence of acute cardiopulmonary disease.  Cardiomegaly and chronic bilateral interstitial opacities.   Electronically Signed   By: Margarette Canada M.D.   On: 08/08/2014 09:19   X-ray Chest Pa And Lateral  07/28/2014   CLINICAL DATA:  Shortness breath, cough for 2 days.  EXAM: CHEST  2 VIEW  COMPARISON:  05/07/2014  FINDINGS: Bibasilar airspace opacities, likely atelectasis. Heart is borderline in size. No effusions. No acute bony abnormality.  IMPRESSION: Bibasilar opacities, likely atelectasis.   Electronically Signed   By: Rolm Baptise M.D.   On: 07/28/2014 17:59   US Renal  08/09/2014   CLINICAL DATA:  44 year old female with acute renal failure.  EXAM: RENAL/URINARY TRACT ULTRASOUND COMPLETE  COMPARISON:  None.  FINDINGS: Right Kidney:  Length: 11.4 cm. Echogenicity within normal limits. No mass or hydronephrosis visualized.  Left Kidney:  Length: 11.2 cm. Echogenicity within normal limits. No mass or hydronephrosis visualized.  Bladder:  Appears normal for degree of  bladder distention.  IMPRESSION: Normal renal ultrasound.  No evidence of hydronephrosis.   Electronically Signed   By: Margarette Canada M.D.   On: 08/09/2014 16:08   Dg Chest Port 1 View  08/09/2014   CLINICAL DATA:  Shortness of breath worsening since 08/07/2014. History of sarcoidosis, hypertension, CHF.  EXAM: PORTABLE CHEST - 1 VIEW  COMPARISON:  08/07/2014  FINDINGS: Tracheostomy. Mild cardiac enlargement. Interval development of increasing perihilar vascularity and infiltration in the lung bases suggesting developing edema or pneumonia. No blunting of costophrenic angles. No new pneumothorax. Degenerative changes in the shoulders.  IMPRESSION: Increasing perihilar vascularity and infiltration in the lung bases suggesting developing  edema or pneumonia.   Electronically Signed   By: Lucienne Capers M.D.   On: 08/09/2014 00:09    ASSESSMENT AND PLAN  1 right heart failure-difficult situation. She has sarcoidosis, obstructive sleep apnea, and obesity hypoventilation syndrome contributing to her pulmonary hypertension/right heart failure. Unclear how much pulmonary venous hypertension is contributing. Her renal function was acutely worse. Her diuretics have been held.  Her renal function has now improved again.she will undergo right heart catheterization today to assess her pulmonary pressures and PCWP; this will help [name] manage diuretics. 2 pulmonary hypertension-as above most likely secondary to sarcoidosis, obstructive sleep apnea and obesity hypoventilation syndrome. Tracheostomy is in place. 3 acute on chronic renal failure-possibly related to overdiuresis. Probable contribution from decreased perfusion. Nephrology following. Diuretics on hold. Right heart catheterization.   Agree with holding ARB. 4 obesity hypoventilation syndrome-needs weight loss. 5 hypertension-blood pressure normal to slightly elevated today. Blood pressure medications are on hold. Follow and adjust as needed. 6 sarcoid-continue  steroids.  Signed, Darlin Coco MD

## 2014-08-12 NOTE — Interval H&P Note (Signed)
History and Physical Interval Note:  08/12/2014 5:50 PM  Melody Gray  has presented today for surgery, with the diagnosis of pulomonary hypertension  The various methods of treatment have been discussed with the patient and family. After consideration of risks, benefits and other options for treatment, the patient has consented to  Procedure(s): RIGHT HEART CATH (N/A) as a surgical intervention .  The patient's history has been reviewed, patient examined, no change in status, stable for surgery.  I have reviewed the patient's chart and labs.  Questions were answered to the patient's satisfaction.     Landree Fernholz

## 2014-08-12 NOTE — Progress Notes (Signed)
Subjective:    The patient is more somnolent this morning and difficult to arouse. She is also slightly confused. She does not report any shortness of breath or pain currently.  Interval Events: -Good urine output after stopping fluids yesterday. -Escherichia coli on urine culture pansensitive.   Objective:    Vital Signs:   Temp:  [98.8 F (37.1 C)-98.9 F (37.2 C)] 98.8 F (37.1 C) (04/13 0514) Pulse Rate:  [83-105] 83 (04/13 0907) Resp:  [20-21] 21 (04/13 0907) BP: (146-148)/(80-86) 146/86 mmHg (04/13 0514) SpO2:  [92 %-100 %] 97 % (04/13 0907) FiO2 (%):  [40 %-60 %] 60 % (04/13 0907) Weight:  [247 lb 12.8 oz (112.401 kg)-254 lb 8 oz (115.44 kg)] 247 lb 12.8 oz (112.401 kg) (04/13 0514) Last BM Date: 08/11/14  24-hour weight change: Weight change: -0 oz (-0.001 kg)  Intake/Output:   Intake/Output Summary (Last 24 hours) at 08/12/14 1113 Last data filed at 08/12/14 0747  Gross per 24 hour  Intake    460 ml  Output   3000 ml  Net  -2540 ml      Physical Exam: General: Trach collar in place, somnolent, confused.  Lungs:  Normal work of breathing, mild rhonchi bilaterally.   Heart: RRR. S1 and S2 normal without gallop, murmur, or rubs.  Abdomen:  BS normoactive. Soft, Nondistended, non-tender.  No masses or organomegaly.  Extremities: Trace edema bilaterally, improved.     Labs:  Basic Metabolic Panel:  Recent Labs Lab 08/07/14 1653  08/09/14 1600 08/10/14 0405 08/10/14 1220 08/10/14 1820 08/11/14 0510 08/12/14 0358  NA 137  < >  --  136 136 135 142 144  K 5.8*  < >  --  6.1* 5.5* 5.2* 4.9 4.7  CL 96  < >  --  97 97 96 101 101  CO2 30  < >  --  32 25 30 34* 35*  GLUCOSE 233*  < >  --  242* 292* 356* 210* 287*  BUN 42*  < >  --  64* 64* 61* 51* 28*  CREATININE 2.69*  < >  --  4.75* 3.90* 3.45* 2.27* 1.34*  CALCIUM 8.7  < >  --  7.7* 8.2* 8.2* 8.3* 9.1  MG 1.2*  --  1.4*  --   --   --   --   --   < > = values in this interval not  displayed.  Liver Function Tests:  Recent Labs Lab 08/08/14 2323  AST 18  ALT 15  ALKPHOS 87  BILITOT 0.9  PROT 7.1  ALBUMIN 3.3*   CBC:  Recent Labs Lab 08/08/14 2323 08/09/14 0540 08/10/14 0405 08/11/14 0510 08/12/14 0358  WBC 10.4 10.7* 9.8 9.0 5.9  NEUTROABS 7.1  --   --   --   --   HGB 11.5* 10.9* 10.3* 10.9* 10.7*  HCT 38.2 37.0 35.5* 37.9 36.1  MCV 97.2 98.9 100.3* 100.8* 101.4*  PLT 268 256 251 229 217    Cardiac Enzymes:  Recent Labs Lab 08/08/14 2323  TROPONINI <0.03   CBG:  Recent Labs Lab 08/11/14 0604 08/11/14 1123 08/11/14 1614 08/11/14 2150 08/12/14 0551  GLUCAP 189* 227* 341* 304* 222*   ABG    Component Value Date/Time   PHART 7.382 08/12/2014 1145   PCO2ART 62.6* 08/12/2014 1145   PO2ART 61.5* 08/12/2014 1145   HCO3 36.4* 08/12/2014 1145   TCO2 38.3 08/12/2014 1145   O2SAT 91.4 08/12/2014 1145   Microbiology: Results for orders  placed or performed during the hospital encounter of 08/08/14  Culture, Urine     Status: None   Collection Time: 08/09/14 12:55 PM  Result Value Ref Range Status   Specimen Description URINE, RANDOM  Final   Special Requests NONE  Final   Colony Count   Final    >=100,000 COLONIES/ML Performed at [facility]   Final    ESCHERICHIA COLI Performed at [facility]    Report Status 08/11/2014 FINAL  Final   Organism ID, Bacteria ESCHERICHIA COLI  Final      Susceptibility   Escherichia coli - MIC*    AMPICILLIN <=2 SENSITIVE Sensitive     CEFAZOLIN <=4 SENSITIVE Sensitive     CEFTRIAXONE <=1 SENSITIVE Sensitive     CIPROFLOXACIN <=0.25 SENSITIVE Sensitive     GENTAMICIN <=1 SENSITIVE Sensitive     LEVOFLOXACIN <=0.12 SENSITIVE Sensitive     NITROFURANTOIN <=16 SENSITIVE Sensitive     TOBRAMYCIN <=1 SENSITIVE Sensitive     TRIMETH/SULFA <=20 SENSITIVE Sensitive     PIP/TAZO <=4 SENSITIVE Sensitive     * ESCHERICHIA COLI   Imaging: No results found.      Medications:    Infusions: . sodium chloride 10 mL/hr (08/12/14 0527)    Scheduled Medications: . atorvastatin  20 mg Oral q1800  . budesonide (PULMICORT) nebulizer solution  0.25 mg Nebulization BID  . cefTRIAXone (ROCEPHIN)  IV  2 g Intravenous Q24H  . docusate sodium  100 mg Oral BID  . famotidine  20 mg Oral Daily  . ferrous sulfate  325 mg Oral Q breakfast  . heparin  5,000 Units Subcutaneous 3 times per day  . insulin aspart  0-20 Units Subcutaneous TID WC  . insulin aspart  0-5 Units Subcutaneous QHS  . insulin detemir  40 Units Subcutaneous QHS  . ipratropium-albuterol  3 mL Nebulization Q6H  . sodium chloride  3 mL Intravenous Q12H  . sodium chloride  3 mL Intravenous Q12H    PRN Medications:    Assessment/ Plan:    Active Problems:   Acute on chronic diastolic CHF (congestive heart failure)  #Altered mental status Patient was somnolent and slowly confuses morning. Her blood sugar was normal this morning. She received a higher dose of prednisone yesterday, and she has had some confusion on prednisone in the past per her PCP. Arterial blood gas ordered stat this morning unremarkable other than some CO2 retention. Potentially related to increased dose of prednisone. -Reduce prednisone back to 10 mg is evening. We'll have night team assess and determine if she is still confused prior to administering the dose. -Wean oxygen to goal pulse ox of 90-92%.  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 Improved urine output off of fluids yesterday. Lower extremity edema has improved this morning. We'll continue to hold diuretics until a right heart cath has been performed and cardiology gives recommendations. -Appreciate cardiology recommendations. -Right heart catheterization today. -Reduce prednisone to 10 mg and 20 mg alternating. -Continue to hold Lasix. -Continue strict ins and outs. -Daily weights. -PT and OT  evaluation. -Nothing by mouth for right heart cath today. -Restart diet post procedure.  #Acute on chronic kidney disease Creatinine is improved this morning to 1.34. She had good urine output after stopping fluids yesterday with net -3 L out. Nephrology has signed off. -Foley catheter per nephrology to monitor urine output. -Continue to monitor creatinine. -Continue to hold diuretics.  #Hyperkalemia  Resolved. -Monitor BMP  #Urinary tract infection Escherichia coli is pansensitive. We will switch to oral antibiotics once mental status improves. -Continue ceftriaxone IV, day 4.  #Obesity hypoventilation syndrome -Continue oxygen supplement vision via trach collar as needed. Okay to transition to nasal cannula. -Continue DuoNeb's every 6 hours. -Continue Pulmicort nebulization twice a day.  #Hypertension Blood pressure improved this morning after receiving prednisone. -Continue to hold home Coreg 12.5 mg twice a day, amlodipine 5 mg daily, Lasix 40 mg daily, and irbesartan 150 mg daily. -Reassess tomorrow and consider restarting some meds.  #Type 2 diabetes Blood sugars stable this morning. -Continue Levemir 40 units daily at bedtime if diet is restarted after procedure. -Continue sliding scale insulin resistant.  #Hyperlipidemia -Continue home atorvastatin 20 mg daily.   DVT PPX - heparin  CODE STATUS - Full.  CONSULTS PLACED - Nephrology.  DISPO - Disposition is deferred at this time, awaiting improvement of right heart failure.   Anticipated discharge in approximately 1-3 day(s).   The patient does have a current PCP Jerene Pitch, MD) and does need an [facility] hospital follow-up appointment after discharge.    Is the [facility] hospital follow-up appointment a one-time only appointment? no.  Does the patient have transportation limitations that hinder transportation to clinic appointments? yes   SERVICE NEEDED AT [facility]          Y = Yes, Blank = No PT:   OT:   RN:   Equipment:   Other:      Length of Stay: 3 day(s)   Signed: Charlesetta Shanks, MD  PGY-1, Internal Medicine Resident Pager: [phone] (7AM-5PM) 08/12/2014, 11:13 AM

## 2014-08-12 NOTE — Progress Notes (Signed)
Inpatient Diabetes Program Recommendations  AACE/ADA: New Consensus Statement on Inpatient Glycemic Control (2013)  Target Ranges:  Prepandial:   less than 140 mg/dL      Peak postprandial:   less than 180 mg/dL (1-2 hours)      Critically ill patients:  140 - 180 mg/dL   Reason for Visit: Hyperglycemia  Diabetes history: DM2 Outpatient Diabetes medications: Levemir 28 in am and 20 QHS, Novolog s/s, metformin 1000 mg bid Current orders for Inpatient glycemic control: Levemir 40 units QHS, Novolog resistant tidwc and hs  Results for SRINIKA, DREWNIAK (MRN [id number]) as of 08/12/2014 14:02  Ref. Range 08/11/2014 11:23 08/11/2014 16:14 08/11/2014 21:50 08/12/2014 05:51 08/12/2014 11:07  Glucose-Capillary Latest Ref Range: 70-99 mg/dL 227 (H) 341 (H) 304 (H) 222 (H) 159 (H)  Results for DEZTINY, DEJONG (MRN [id number]) as of 08/12/2014 14:02  Ref. Range 07/28/2014 13:37  Hemoglobin A1C Unknown 9.7   Post-prandial blood sugars elevated. FBS looks good. May benefit from addition of meal coverage insulin.  Please consider addition of Novolog 4 units tidwc for meal coverage insulin if pt eats >50% meal.  Will continue to follow. Thank you. Lorenda Peck, RD, LDN, CDE Inpatient Diabetes Coordinator [phone]

## 2014-08-12 NOTE — Consult Note (Signed)
   [facility] CM Inpatient Consult   08/12/2014  Melody Gray 27-Mar-1971 [id number] Spoke with patient at bedside regarding the restart of services with [facility]. Patient verbalized her interest in [facility] services. Patient signed consent form and copy given for disease management support or other services. Patient will receive post hospital follow up calls and be assessed for home visits. Of note, [facility] services does not replace or interfere with any services that are arranged by inpatient case management or social work.  For questions, please contact: Natividad Brood, RN BSN [facility] Liaison  [phone] business mobile phone

## 2014-08-12 NOTE — Evaluation (Signed)
Occupational Therapy Evaluation Patient Details Name: Melody Gray MRN: [id number] DOB: 09/12/1970 Today's Date: 08/12/2014    History of Present Illness Patient is a 44 year old woman with history of congestive heart failure, CKD, sarcoidosis, hypertension, diabetes, obesity hypoventilation syndrome status post tracheostomy, and other problems as outlined in the medical history, admitted with complaint of progressively worsening shortness of breath and decreased urine output    Clinical Impression   Pt was independent in self care prior to admission and ambulating without a device.  She stood in the tub to shower.  Pt demonstrating difficulty with processing and responding to inquiries about her prior functional status and with direction following.  Pt with decreased activity tolerance and impaired balance further impacting ability to perform self care. Will follow acutely.   Follow Up Recommendations  Home health OT    Equipment Recommendations       Recommendations for Other Services       Precautions / Restrictions Precautions Precautions: Fall Restrictions Weight Bearing Restrictions: No      Mobility Bed Mobility Overal bed mobility: Needs Assistance Bed Mobility: Supine to Sit     Supine to sit: Modified independent (Device/Increase time);HOB elevated     General bed mobility comments: uses momentum, HOB up, rail  Transfers Overall transfer level: Needs assistance Equipment used: Rolling walker (2 wheeled) Transfers: Sit to/from [facility] Sit to Stand: Supervision Stand pivot transfers: Supervision            Balance     Sitting balance-Leahy Scale: Good       Standing balance-Leahy Scale: Poor                              ADL Overall ADL's : Needs assistance/impaired Eating/Feeding: Independent;Sitting (currently NPO for test)   Grooming: Wash/dry face;Sitting;Set up   Upper Body Bathing: Set up;Sitting    Lower Body Bathing: Minimal assistance;Sit to/from stand   Upper Body Dressing : Set up;Sitting   Lower Body Dressing: Minimal assistance;Sit to/from stand   Toilet Transfer: Stand-pivot;Supervision/safety;BSC   Toileting- Water quality scientist and Hygiene: Sit to/from stand;Supervision/safety       Functional mobility during ADLs: Supervision/safety;Rolling walker       Vision     Perception     Praxis      Pertinent Vitals/Pain Pain Assessment: No/denies pain     Hand Dominance Right   Extremity/Trunk Assessment Upper Extremity Assessment Upper Extremity Assessment: Overall WFL for tasks assessed   Lower Extremity Assessment Lower Extremity Assessment: Defer to PT evaluation       Communication Communication Communication: No difficulties   Cognition Arousal/Alertness: Awake/alert Behavior During Therapy: WFL for tasks assessed/performed Overall Cognitive Status: Impaired/Different from baseline Area of Impairment: Safety/judgement;Awareness;Problem solving Orientation Level: Time   Memory: Decreased short-term memory   Safety/Judgement: Decreased awareness of safety   Problem Solving: Slow processing General Comments: pt with word finding problems, difficulty following conversation with some perseveration   General Comments       Exercises       Shoulder Instructions      Home Living Family/patient expects to be discharged to:: Private residence Living Arrangements: Children Available Help at Discharge: Family;Available PRN/intermittently Type of Home: House Home Access: Level entry     Home Layout: One level     Bathroom Shower/Tub: Teacher, early years/pre: Standard     Home Equipment: Crutches;Cane - single point   Additional Comments: 2L/min  supplemental O2 at home      Prior Functioning/Environment Level of Independence: Independent        Comments: on disability, uses motorized w/c in grocery, can perform meal  prep and housekeeping, but her children and boyfriend tend to do it for her    OT Diagnosis: Generalized weakness;Cognitive deficits   OT Problem List: Decreased activity tolerance;Impaired balance (sitting and/or standing);Decreased cognition;Decreased safety awareness;Obesity;Cardiopulmonary status limiting activity   OT Treatment/Interventions: Self-care/ADL training;Energy conservation;DME and/or AE instruction;Cognitive remediation/compensation;Patient/family education;Balance training    OT Goals(Current goals can be found in the care plan section) Acute Rehab OT Goals Patient Stated Goal: be able to go home OT Goal Formulation: With patient Time For Goal Achievement: 08/26/14 Potential to Achieve Goals: Good ADL Goals Pt Will Perform Grooming: with supervision;standing Pt Will Perform Lower Body Bathing: with supervision;sit to/from stand Pt Will Perform Lower Body Dressing: with supervision;sit to/from stand Pt Will Transfer to Toilet: with supervision;ambulating;regular height toilet Pt Will Perform Toileting - Clothing Manipulation and hygiene: with supervision;sit to/from stand Additional ADL Goal #1: Pt will employ energy conservation strategies in ADL and mobility with supervision. Additional ADL Goal #2: Pt will identify and gather items necessary to perform ADL with supervision.  OT Frequency: Min 2X/week   Barriers to D/C:            Co-evaluation              End of Session Equipment Utilized During Treatment: Gait belt;Rolling walker;Oxygen (6L)  Activity Tolerance: Patient tolerated treatment well Patient left: in bed;with call bell/phone within reach;with bed alarm set (at EOB)   Time: [id number] OT Time Calculation (min): 31 min Charges:  OT General Charges $OT Visit: 1 Procedure OT Evaluation $Initial OT Evaluation Tier I: 1 Procedure OT Treatments $Self Care/Home Management : 8-22 mins G-Codes:    Malka So 08/12/2014, 2:41 PM   [phone]

## 2014-08-12 NOTE — Progress Notes (Signed)
Pt continuing to refuse suction and breathing treatment from RT. Pt states she wants to be left alone.

## 2014-08-12 NOTE — Progress Notes (Signed)
Patient seen and examined. Case d/w residents in detail. I have reviewed Dr. Vivia Budge note and agree with findings and plan as documented.  Patient was noted to be more somnolent this AM and slightly confused. ABG done with normal pH and likely chronic CO2 retention with compensation. AMS likely related to increased prednisone dose. Would decrease prednisone back to 10 mg today.  C/w PT, OT  AKI on CKD resolving. Cr back at baseline with good urine output. Will monitor. Nephro signed off.  Patient with right HF secondary to [facility]. She was scheduled for [facility]HC today. Will f/u. Will continue to hold lasix for now.  Would change abx to PO in AM for pan sensitive E. Coli UTI

## 2014-08-12 NOTE — Progress Notes (Signed)
[place] abg aware ,cbg result aware . To start to titrate O2 sat at   About 92% . Pt continued to monitor

## 2014-08-12 NOTE — Progress Notes (Signed)
Bosie Helper came back from cardiac cath . Level 0 on r femoral . Pt  complaining of pain over the suprapubic area,intenified by palpation . Decreased  Urine output the patient said that she has the urge to [place] . Leg strap adjusted . Large amount of urine came with some relief . Wet warm ttwel applied . Referred from MD with order  Tylenol 2 tabs given as order1900 kept comfortable in bed with leg kept straight  S/p cardiac cath

## 2014-08-12 NOTE — Progress Notes (Signed)
0800 Pt somnolent and sleepy .Wakeable ,easily goes to back to sleep . Pt's siste rvisited and verbalized her concern  About the pt's mentation . Being more repetitious and confused at times . Placed a call to MD . And made aware of above . Will follow up . 71 MD came in and evaluated the pt. With orders

## 2014-08-12 NOTE — CV Procedure (Signed)
Cardiac Cath Procedure Note:  Indication:  Pumlonary HTN  Procedures performed:  1) Right heart catheterization  Description of procedure:   The risks and indication of the procedure were explained. Consent was signed and placed on the chart. An appropriate timeout was taken prior to the procedure. The right groin was prepped and draped in the routine sterile fashion and anesthetized with 1% local lidocaine.   A 7 FR venous sheath was placed in the right femoral vein using a modified Seldinger technique. A standard Swan-Ganz catheter was used for the procedure.   Complications: None apparent.  Findings:  RA = 18 RV = 70/13/18 PA = 68/24 (45) PCW = 21 Fick cardiac output/index = 6.5/3.1 PVR = 4.0 Ao sat = 94% PA sat = 62%, 62% RA sat = 65%  Assessment: 1. Moderate pulmonary HTN with normal cardiac output and minimally elevated left-sided pressures  Plan/Discussion:  She has moderate pulmonary HTN likely Who Group 3 with markedly elevated CVP. Left-sided pressures relatively normal.   Would place on po torsemide and see if we can continue to diurese her slowly without recurrent renal failure.   Continue supplemental O2. Needs weight loss. Would not start on selective pulmonary vasodilators at this point.   Glori Bickers MD 5:47 PM

## 2014-08-12 NOTE — Progress Notes (Signed)
Patient Name: Melody Gray Date of Encounter: 08/12/2014     Active Problems:   Acute on chronic diastolic CHF (congestive heart failure)    SUBJECTIVE  The patient is somnolent.  She awakens with stimulation but falls back to sleep easily.  She denies any chest pain or increased shortness of breath.  Her renal function has improved since Lasix was held.  She does not have any peripheral edema today.  She will be having right heart catheterization later this morning  CURRENT MEDS . atorvastatin  20 mg Oral q1800  . budesonide (PULMICORT) nebulizer solution  0.25 mg Nebulization BID  . cefTRIAXone (ROCEPHIN)  IV  2 g Intravenous Q24H  . docusate sodium  100 mg Oral BID  . famotidine  20 mg Oral Daily  . ferrous sulfate  325 mg Oral Q breakfast  . heparin  5,000 Units Subcutaneous 3 times per day  . insulin aspart  0-20 Units Subcutaneous TID WC  . insulin aspart  0-5 Units Subcutaneous QHS  . insulin detemir  40 Units Subcutaneous QHS  . ipratropium-albuterol  3 mL Nebulization Q6H  . predniSONE  60 mg Oral Q breakfast  . sodium chloride  3 mL Intravenous Q12H  . sodium chloride  3 mL Intravenous Q12H    OBJECTIVE  Filed Vitals:   08/12/14 0004 08/12/14 0305 08/12/14 0514 08/12/14 0907  BP:   146/86   Pulse: 93 89 100 83  Temp:   98.8 F (37.1 C)   TempSrc:   Oral   Resp: 20 20 20 21   Height:      Weight:   247 lb 12.8 oz (112.401 kg)   SpO2: 95% 96% 100% 97%    Intake/Output Summary (Last 24 hours) at 08/12/14 1022 Last data filed at 08/12/14 0747  Gross per 24 hour  Intake    460 ml  Output   3000 ml  Net  -2540 ml   Filed Weights   08/11/14 0512 08/11/14 1925 08/12/14 0514  Weight: 254 lb 8 oz (115.44 kg) 254 lb 8 oz (115.44 kg) 247 lb 12.8 oz (112.401 kg)    PHYSICAL EXAM  General: Pleasant, NAD. Neuro: Alert and oriented X 3. Moves all extremities spontaneously. Psych: Normal affect. HEENT:  Normal  Neck: Supple without bruits or JVD. Lungs:   Resp regular and unlabored, CTA. Heart: RRR no s3, s4, or murmurs. Abdomen: Soft, non-tender, non-distended, BS + x 4.  Extremities: No clubbing, cyanosis or edema. DP/PT/Radials 2+ and equal bilaterally.  Accessory Clinical Findings  CBC  Recent Labs  08/11/14 0510 08/12/14 0358  WBC 9.0 5.9  HGB 10.9* 10.7*  HCT 37.9 36.1  MCV 100.8* 101.4*  PLT 229 [id number]   Basic Metabolic Panel  Recent Labs  08/09/14 1600  08/11/14 0510 08/12/14 0358  NA  --   < > 142 144  K  --   < > 4.9 4.7  CL  --   < > 101 101  CO2  --   < > 34* 35*  GLUCOSE  --   < > 210* 287*  BUN  --   < > 51* 28*  CREATININE  --   < > 2.27* 1.34*  CALCIUM  --   < > 8.3* 9.1  MG 1.4*  --   --   --   < > = values in this interval not displayed. Liver Function Tests No results for input(s): AST, ALT, ALKPHOS, BILITOT, PROT, ALBUMIN in the last  72 hours. No results for input(s): LIPASE, AMYLASE in the last 72 hours. Cardiac Enzymes No results for input(s): CKTOTAL, CKMB, CKMBINDEX, TROPONINI in the last 72 hours. BNP Invalid input(s): POCBNP D-Dimer No results for input(s): DDIMER in the last 72 hours. Hemoglobin A1C No results for input(s): HGBA1C in the last 72 hours. Fasting Lipid Panel No results for input(s): CHOL, HDL, LDLCALC, TRIG, CHOLHDL, LDLDIRECT in the last 72 hours. Thyroid Function Tests No results for input(s): TSH, T4TOTAL, T3FREE, THYROIDAB in the last 72 hours.  Invalid input(s): FREET3  TELE  Normal sinus rhythm  ECG    Radiology/Studies  X-ray Chest Pa And Lateral  08/09/2014   CLINICAL DATA:  44 year old female with acute on chronic diastolic congestive heart failure. Shortness of Breath. Initial encounter.  EXAM: CHEST  2 VIEW  COMPARISON:  08/08/2014 and earlier.  FINDINGS: Lower lung volumes. Stable cardiomegaly and mediastinal contours. Tracheostomy remains in place. Mildly increased interstitial and streaky mid and lower lung opacity. No pneumothorax. No definite pleural  effusion. Stable visualized osseous structures.  IMPRESSION: Streaky and interstitial bilateral mid and lower lung opacity has mildly progressed. Differential considerations include increased atelectasis, mild interstitial edema, or less likely bilateral pneumonia. No pleural effusion identified.   Electronically Signed   By: Genevie Ann M.D.   On: 08/09/2014 09:39   Dg Chest 2 View  08/08/2014   CLINICAL DATA:  Cough and wheezing for 8 days.  History of sarcoid.  EXAM: CHEST  2 VIEW  COMPARISON:  07/28/2014 and prior chest radiographs dating back to 10/05/2010.  FINDINGS: Cardiomegaly and a tracheostomy tube again noted.  The cardiomediastinal silhouette is unchanged.  Interstitial opacities bilaterally and mild peribronchial thickening are unchanged.  There is no evidence of focal airspace disease, pulmonary edema, suspicious pulmonary nodule/mass, pleural effusion, or pneumothorax. No acute bony abnormalities are identified.  IMPRESSION: No evidence of acute cardiopulmonary disease.  Cardiomegaly and chronic bilateral interstitial opacities.   Electronically Signed   By: Margarette Canada M.D.   On: 08/08/2014 09:19   X-ray Chest Pa And Lateral  07/28/2014   CLINICAL DATA:  Shortness breath, cough for 2 days.  EXAM: CHEST  2 VIEW  COMPARISON:  05/07/2014  FINDINGS: Bibasilar airspace opacities, likely atelectasis. Heart is borderline in size. No effusions. No acute bony abnormality.  IMPRESSION: Bibasilar opacities, likely atelectasis.   Electronically Signed   By: Rolm Baptise M.D.   On: 07/28/2014 17:59   US Renal  08/09/2014   CLINICAL DATA:  44 year old female with acute renal failure.  EXAM: RENAL/URINARY TRACT ULTRASOUND COMPLETE  COMPARISON:  None.  FINDINGS: Right Kidney:  Length: 11.4 cm. Echogenicity within normal limits. No mass or hydronephrosis visualized.  Left Kidney:  Length: 11.2 cm. Echogenicity within normal limits. No mass or hydronephrosis visualized.  Bladder:  Appears normal for degree of  bladder distention.  IMPRESSION: Normal renal ultrasound.  No evidence of hydronephrosis.   Electronically Signed   By: Margarette Canada M.D.   On: 08/09/2014 16:08   Dg Chest Port 1 View  08/09/2014   CLINICAL DATA:  Shortness of breath worsening since 08/07/2014. History of sarcoidosis, hypertension, CHF.  EXAM: PORTABLE CHEST - 1 VIEW  COMPARISON:  08/07/2014  FINDINGS: Tracheostomy. Mild cardiac enlargement. Interval development of increasing perihilar vascularity and infiltration in the lung bases suggesting developing edema or pneumonia. No blunting of costophrenic angles. No new pneumothorax. Degenerative changes in the shoulders.  IMPRESSION: Increasing perihilar vascularity and infiltration in the lung bases suggesting developing  edema or pneumonia.   Electronically Signed   By: Lucienne Capers M.D.   On: 08/09/2014 00:09    ASSESSMENT AND PLAN  1 right heart failure-difficult situation. She has sarcoidosis, obstructive sleep apnea, and obesity hypoventilation syndrome contributing to her pulmonary hypertension/right heart failure. Unclear how much pulmonary venous hypertension is contributing. Her renal function was acutely worse. Her diuretics have been held.  Her renal function has now improved again.she will undergo right heart catheterization today to assess her pulmonary pressures and PCWP; this will help [name] manage diuretics. 2 pulmonary hypertension-as above most likely secondary to sarcoidosis, obstructive sleep apnea and obesity hypoventilation syndrome. Tracheostomy is in place. 3 acute on chronic renal failure-possibly related to overdiuresis. Probable contribution from decreased perfusion. Nephrology following. Diuretics on hold. Right heart catheterization.   Agree with holding ARB. 4 obesity hypoventilation syndrome-needs weight loss. 5 hypertension-blood pressure normal to slightly elevated today. Blood pressure medications are on hold. Follow and adjust as needed. 6 sarcoid-continue  steroids.  Signed, Darlin Coco MD

## 2014-08-13 ENCOUNTER — Inpatient Hospital Stay (HOSPITAL_COMMUNITY): Payer: Medicare Other

## 2014-08-13 LAB — BASIC METABOLIC PANEL
Anion gap: 10 (ref 5–15)
BUN: 25 mg/dL — ABNORMAL HIGH (ref 6–23)
CO2: 36 mmol/L — ABNORMAL HIGH (ref 19–32)
Calcium: 9 mg/dL (ref 8.4–10.5)
Chloride: 94 mmol/L — ABNORMAL LOW (ref 96–112)
Creatinine, Ser: 2.02 mg/dL — ABNORMAL HIGH (ref 0.50–1.10)
GFR calc Af Amer: 33 mL/min — ABNORMAL LOW (ref >90)
GFR calc non Af Amer: 29 mL/min — ABNORMAL LOW (ref >90)
Glucose, Bld: 233 mg/dL — ABNORMAL HIGH (ref 70–99)
Potassium: 4 mmol/L (ref 3.5–5.1)
Sodium: 140 mmol/L (ref 135–145)

## 2014-08-13 LAB — GLUCOSE, CAPILLARY
Glucose-Capillary: 216 mg/dL — ABNORMAL HIGH (ref 70–99)
Glucose-Capillary: 205 mg/dL — ABNORMAL HIGH (ref 70–99)
Glucose-Capillary: 151 mg/dL — ABNORMAL HIGH (ref 70–99)
Glucose-Capillary: 144 mg/dL — ABNORMAL HIGH (ref 70–99)

## 2014-08-13 MED ORDER — HEPARIN SODIUM (PORCINE) 5000 UNIT/ML IJ SOLN
5000.0000 [IU] | Freq: Three times a day (TID) | INTRAMUSCULAR | Status: DC
  Administered 2014-08-13 – 2014-08-16 (×10): 5000 [IU] via SUBCUTANEOUS
  Filled 2014-08-13 (×12): qty 1

## 2014-08-13 MED ORDER — TRAMADOL HCL 50 MG PO TABS
50.0000 mg | ORAL_TABLET | Freq: Two times a day (BID) | ORAL | Status: DC | PRN
  Administered 2014-08-13 – 2014-08-15 (×4): 50 mg via ORAL
  Filled 2014-08-13 (×4): qty 1

## 2014-08-13 MED ORDER — BISACODYL 10 MG RE SUPP
10.0000 mg | Freq: Every day | RECTAL | Status: DC | PRN
  Administered 2014-08-13: 10 mg via RECTAL
  Filled 2014-08-13: qty 1

## 2014-08-13 MED ORDER — POLYETHYLENE GLYCOL 3350 17 G PO PACK
17.0000 g | PACK | Freq: Every day | ORAL | Status: DC
  Administered 2014-08-13 – 2014-08-16 (×4): 17 g via ORAL
  Filled 2014-08-13 (×4): qty 1

## 2014-08-13 MED ORDER — PREDNISONE 10 MG PO TABS
10.0000 mg | ORAL_TABLET | ORAL | Status: DC
  Administered 2014-08-14 – 2014-08-16 (×2): 10 mg via ORAL
  Filled 2014-08-13 (×2): qty 1

## 2014-08-13 MED ORDER — ONDANSETRON HCL 4 MG/2ML IJ SOLN: 4.0000 mg | Freq: Four times a day (QID) | INTRAMUSCULAR | Status: DC | PRN

## 2014-08-13 MED ORDER — SODIUM CHLORIDE 0.9 % IV SOLN: 250.0000 mL | INTRAVENOUS | Status: DC | PRN

## 2014-08-13 MED ORDER — TORSEMIDE 20 MG PO TABS
20.0000 mg | ORAL_TABLET | Freq: Every day | ORAL | Status: DC
Start: 2014-08-14 — End: 2014-08-14
  Filled 2014-08-13: qty 1

## 2014-08-13 MED ORDER — ACETAMINOPHEN 325 MG PO TABS
650.0000 mg | ORAL_TABLET | Freq: Four times a day (QID) | ORAL | Status: DC | PRN
  Administered 2014-08-14: 650 mg via ORAL
  Filled 2014-08-13: qty 2

## 2014-08-13 MED ORDER — PREDNISONE 5 MG PO TABS
5.0000 mg | ORAL_TABLET | ORAL | Status: DC
  Administered 2014-08-13 – 2014-08-15 (×2): 5 mg via ORAL
  Filled 2014-08-13 (×2): qty 1

## 2014-08-13 MED ORDER — SODIUM CHLORIDE 0.9 % IJ SOLN
3.0000 mL | Freq: Two times a day (BID) | INTRAMUSCULAR | Status: DC
  Administered 2014-08-13 – 2014-08-16 (×9): 3 mL via INTRAVENOUS

## 2014-08-13 MED ORDER — ACETAMINOPHEN 325 MG PO TABS: 650.0000 mg | ORAL_TABLET | ORAL | Status: DC | PRN

## 2014-08-13 MED ORDER — INSULIN ASPART 100 UNIT/ML ~~LOC~~ SOLN
4.0000 [IU] | Freq: Three times a day (TID) | SUBCUTANEOUS | Status: DC
  Administered 2014-08-13 – 2014-08-16 (×10): 4 [IU] via SUBCUTANEOUS

## 2014-08-13 MED ORDER — SODIUM CHLORIDE 0.9 % IJ SOLN: 3.0000 mL | INTRAMUSCULAR | Status: DC | PRN

## 2014-08-13 MED ORDER — CEPHALEXIN 500 MG PO CAPS
500.0000 mg | ORAL_CAPSULE | Freq: Two times a day (BID) | ORAL | Status: DC
  Administered 2014-08-13 – 2014-08-16 (×6): 500 mg via ORAL
  Filled 2014-08-13 (×7): qty 1

## 2014-08-13 NOTE — Progress Notes (Signed)
Bladder scan performed and reveals less than 90 cc's

## 2014-08-13 NOTE — Progress Notes (Signed)
Inpatient Diabetes Program Recommendations  AACE/ADA: New Consensus Statement on Inpatient Glycemic Control (2013)  Target Ranges:  Prepandial:   less than 140 mg/dL      Peak postprandial:   less than 180 mg/dL (1-2 hours)      Critically ill patients:  140 - 180 mg/dL   Reason for Visit:Hyperglycemia   Results for Melody, GENGLER (MRN [id number]) as of 08/13/2014 15:05  Ref. Range 08/12/2014 21:17 08/13/2014 06:15 08/13/2014 11:46  Glucose-Capillary Latest Ref Range: 70-99 mg/dL 220 (H) 216 (H) 205 (H)    Needs insulin adjustments.  Recommendations: Increase Levemir to 45 units QHS Increase Novolog to 6 units tidwc.  Will continue to follow. Thank you. Lorenda Peck, RD, LDN, CDE Inpatient Diabetes Coordinator [phone]

## 2014-08-13 NOTE — Discharge Summary (Signed)
Name: Melody Gray MRN: [id number] DOB: 1970/09/02 44 y.o. 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    Acute kidney injury on CKD   Acute pulmonary edema   Urinary Tract Infection    Abdominal pain    Obesity hypoventilation syndrome   Sarcoidosis   DMII   Discharge Medications:   Medication List    STOP taking these medications        amLODipine 5 MG tablet  Commonly known as:  NORVASC     carvedilol 12.5 MG tablet  Commonly known as:  COREG     furosemide 40 MG tablet  Commonly known as:  LASIX     irbesartan 150 MG tablet  Commonly known as:  AVAPRO      TAKE these medications        albuterol 108 (90 BASE) MCG/ACT inhaler  Commonly known as:  PROVENTIL HFA;VENTOLIN HFA  Inhale 2 puffs into the lungs every 6 (six) hours as needed for wheezing or shortness of breath.     atorvastatin 20 MG tablet  Commonly known as:  LIPITOR  Take 1 tablet (20 mg total) by mouth daily at 6 PM.     B-D INS SYRINGE 0.5CC/30GX1/2" 30G X 1/2" 0.5 ML Misc  Generic drug:  Insulin Syringe-Needle U-100  USE AS DIRECTED     cephALEXin 500 MG capsule  Commonly known as:  KEFLEX  Take 1 capsule (500 mg total) by mouth every 12 (twelve) hours. until finished.     docusate sodium 100 MG capsule  Commonly known as:  COLACE  Take 1 capsule (100 mg total) by mouth 2 (two) times daily.     famotidine 20 MG tablet  Commonly known as:  PEPCID  Take 20 mg by mouth daily.     ferrous sulfate 325 (65 FE) MG tablet  Take 1 tablet (325 mg total) by mouth 3 (three) times daily with meals.     fluticasone 50 MCG/ACT nasal spray  Commonly known as:  FLONASE  Place 2 sprays into both nostrils daily.     insulin aspart 100 UNIT/ML  injection  Commonly known as:  novoLOG  - Take novolog per sliding scale:  - CBG 70-120: 0 units   - CBG 121-150: 1 unit   - CBG 151-200: 2 units   - CBG 201-250: 3 units   - CBG 251-300: 5 units   - CBG 301-350: 7 units   - CBG 351-400: 9 units   - CBG > 400: call MD     insulin detemir 100 UNIT/ML injection  Commonly known as:  LEVEMIR  Inject 0.4 mLs (40 Units total) into the skin at bedtime.     Melatonin 3 MG Tabs  Take 1 tablet (3 mg total) by mouth at bedtime as needed.     metFORMIN 1000 MG tablet  Commonly known as:  GLUCOPHAGE  take 1 tablet by mouth twice a day with meals     predniSONE 5 MG tablet  Commonly known as:  DELTASONE  Take 1 tablet (5 mg total) by mouth every other day.     predniSONE 10 MG tablet  Commonly known as:  DELTASONE  Take 1 tablet (10 mg total) by mouth every other day.  torsemide 20 MG tablet  Commonly known as:  DEMADEX  Take 1 tablet (20 mg total) by mouth daily.  Start taking on:  08/16/2014        Disposition and follow-up:   Ms.Greydis S Teel was discharged from [facility] in good condition.  At the hospital follow up visit please address:  1.  Improvement in dyspnea, compliance with trach collar and meds, resolution of abdominal pain.  Ensure she has follow up with cardiology in ~2 weeks per Dr. Stanford Breed.    2.  Labs/imaging requested at follow-up: BMP (please supplement potassium if needed), weight, O2 sat.    3.  Pending labs/ test needing follow-up: N/A  Follow-up Appointments: Follow-up Information    Schedule an appointment as soon as possible for a visit with [facility].   Why:  Hospital follow-up appointment   Contact information:   [address] [id number]      Follow up with [facility].   Why:  Home Health RN, Physical Therapy, Occupational Therapy, aide, and Respiratory Care   Contact information:    [address] [place] [address] [phone]       Follow up with Jerene Pitch, MD.   Specialty:  Internal Medicine   Contact information:   [place] [address] [phone]       Follow up with Kirk Ruths, MD. Schedule an appointment as soon as possible for a visit in 2 weeks.   Specialty:  Cardiology   Contact information:   [address] [place] [address] [phone]       Discharge Instructions: Discharge Instructions    (HEART FAILURE PATIENTS) Call MD:  Anytime you have any of the following symptoms: 1) 3 pound weight gain in 24 hours or 5 pounds in 1 week 2) shortness of breath, with or without a dry hacking cough 3) swelling in the hands, feet or stomach 4) if you have to sleep on extra pillows at night in order to breathe.    Complete by:  As directed      AMB Referral to [facility]    Complete by:  As directed   Reason for consult:  COPD and re-admission  Diagnoses of:  COPD/ Pneumonia  Expected date of contact:  1-3 days (reserved for hospital discharges)     Call MD for:  difficulty breathing, headache or visual disturbances    Complete by:  As directed      Call MD for:  extreme fatigue    Complete by:  As directed      Diet - low sodium heart healthy    Complete by:  As directed      Discharge instructions    Complete by:  As directed   Please take all of your medications and follow up with the internal medicine clinic.     Increase activity slowly    Complete by:  As directed            Consultations: Treatment Team:  Rounding L[facility]iology, MD  Procedures Performed:  X-ray Chest Pa And Lateral  08/09/2014   CLINICAL DATA:  44 year old female with acute on chronic diastolic congestive heart failure. Shortness of Breath. Initial encounter.  EXAM: CHEST  2 VIEW  COMPARISON:  08/08/2014 and earlier.  FINDINGS: Lower lung volumes. Stable cardiomegaly and mediastinal contours. Tracheostomy remains in  place. Mildly increased interstitial and streaky  mid and lower lung opacity. No pneumothorax. No definite pleural effusion. Stable visualized osseous structures.  IMPRESSION: Streaky and interstitial bilateral mid and lower lung opacity has mildly progressed. Differential considerations include increased atelectasis, mild interstitial edema, or less likely bilateral pneumonia. No pleural effusion identified.   Electronically Signed   By: Genevie Ann M.D.   On: 08/09/2014 09:39   Dg Chest 2 View  08/08/2014   CLINICAL DATA:  Cough and wheezing for 8 days.  History of sarcoid.  EXAM: CHEST  2 VIEW  COMPARISON:  07/28/2014 and prior chest radiographs dating back to 10/05/2010.  FINDINGS: Cardiomegaly and a tracheostomy tube again noted.  The cardiomediastinal silhouette is unchanged.  Interstitial opacities bilaterally and mild peribronchial thickening are unchanged.  There is no evidence of focal airspace disease, pulmonary edema, suspicious pulmonary nodule/mass, pleural effusion, or pneumothorax. No acute bony abnormalities are identified.  IMPRESSION: No evidence of acute cardiopulmonary disease.  Cardiomegaly and chronic bilateral interstitial opacities.   Electronically Signed   By: Margarette Canada M.D.   On: 08/08/2014 09:19   X-ray Chest Pa And Lateral  07/28/2014   CLINICAL DATA:  Shortness breath, cough for 2 days.  EXAM: CHEST  2 VIEW  COMPARISON:  05/07/2014  FINDINGS: Bibasilar airspace opacities, likely atelectasis. Heart is borderline in size. No effusions. No acute bony abnormality.  IMPRESSION: Bibasilar opacities, likely atelectasis.   Electronically Signed   By: Rolm Baptise M.D.   On: 07/28/2014 17:59   US Renal  08/09/2014   CLINICAL DATA:  44 year old female with acute renal failure.  EXAM: RENAL/URINARY TRACT ULTRASOUND COMPLETE  COMPARISON:  None.  FINDINGS: Right Kidney:  Length: 11.4 cm. Echogenicity within normal limits. No mass or hydronephrosis visualized.  Left Kidney:  Length: 11.2 cm.  Echogenicity within normal limits. No mass or hydronephrosis visualized.  Bladder:  Appears normal for degree of bladder distention.  IMPRESSION: Normal renal ultrasound.  No evidence of hydronephrosis.   Electronically Signed   By: Margarette Canada M.D.   On: 08/09/2014 16:08   Dg Chest Port 1 View  08/09/2014   CLINICAL DATA:  Shortness of breath worsening since 08/07/2014. History of sarcoidosis, hypertension, CHF.  EXAM: PORTABLE CHEST - 1 VIEW  COMPARISON:  08/07/2014  FINDINGS: Tracheostomy. Mild cardiac enlargement. Interval development of increasing perihilar vascularity and infiltration in the lung bases suggesting developing edema or pneumonia. No blunting of costophrenic angles. No new pneumothorax. Degenerative changes in the shoulders.  IMPRESSION: Increasing perihilar vascularity and infiltration in the lung bases suggesting developing edema or pneumonia.   Electronically Signed   By: Lucienne Capers M.D.   On: 08/09/2014 00:09   Dg Abd Portable 1v  08/13/2014   CLINICAL DATA:  Lower abdominal pain since this evening.  EXAM: PORTABLE ABDOMEN - 1 VIEW  COMPARISON:  None.  FINDINGS: The bowel gas pattern is normal. Moderate volume of colonic stool. No radio-opaque calculi. No evidence of free air. Left lateral abdomen excluded from the field of view. No acute osseous abnormalities seen.  IMPRESSION: Normal bowel gas pattern. There is a moderate volume of stool throughout the colon.   Electronically Signed   By: Jeb Levering M.D.   On: 08/13/2014 03:30    2D Echo:  Study Conclusions  - Left ventricle: The cavity size was normal. Wall thickness was normal. Systolic function was normal. The estimated ejection fraction was in the range of 55% to 60%. Wall motion was normal; there were no regional wall motion abnormalities. Left  ventricular diastolic function parameters were normal. - Ventricular septum: The contour showed diastolic flattening and systolic flattening. These changes  are consistent with RV volume and pressure overload. - Left atrium: The atrium was mildly dilated. - Right ventricle: The cavity size was moderately dilated. - Right atrium: The atrium was mildly dilated. - Tricuspid valve: There was moderate regurgitation directed centrally. - Pulmonary arteries: Systolic pressure was severely increased. PA peak pressure: 83 mm Hg (S).  R Cardiac Cath:  Findings:  RA = 18 RV = 70/13/18 PA = 68/24 (45) PCW = 21 Fick cardiac output/index = 6.5/3.1 PVR = 4.0 Ao sat = 94% PA sat = 62%, 62% RA sat = 65%  Assessment: 1. Moderate pulmonary HTN with normal cardiac output and minimally elevated left-sided pressures  Admission HPI:  Patient is a 44 year old female with a history of congestive heart failure, chronic kidney disease, sarcoidosis, hypertension, type 2 diabetes, obesity hypoventilation syndrome (with tracheostomy) who presents to the hospital for dyspnea. Patient states that she was seen in clinic 3 days prior to presentation on 08/07/2014 for progressive dyspnea, peripheral edema, and orthopnea. Patient was advised to continue taking her furosemide but has noticed that since that appointment, she has not had a significant amount of urine output. Patient denies any associated chest pain, productive cough, fever, unilateral leg swelling. She denies any significant changes in her diet. She notes that during the same time period, her amount of urine output has significantly reduced. Patient states that she has not voided within the 24 hours preceding her presentation to the hospital. Patient states that she has had a trach collar since April 2014. This was placed in the setting of obesity hyperventilation syndrome and patient has been unable to wean off of it since.  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  The patient presented with worsening shortness of breath over the past several days along with some increasing lower extremity edema. She had not been using her trach collar at home, and this was restarted at admission. Her BNP was noted to be elevated to 853 from 154 one week or earlier. An echocardiogram was repeated and showed normal ejection fraction and diastolic function. However, severely increased pulmonary artery pressure was noted. Cardiology was consulted and recommended a right heart catheterization, which showed moderate pulmonary hypertension. Cardiology did not recommend starting selective pulmonary vasodilators at this point. Instead, so diuresis with torsemide 20 mg twice a day was pursued with good diuresis. Her home dose of prednisone was increased initially to twice her home dose for stress dosing, and she was put back on her home dose of prednisone 10 mg and 5 mg alternating each day prior to discharge.  Cardiology discharged her on a 20mg  dose of torsemide once daily.    Acute on chronic kidney disease Her creatinine was noted to be elevated at 3.34 on presentation up from her baseline of 1.08 in January 2016. It was initially thought that she was volume overloaded, and she was given Lasix 80 mg IV. However, her creatinine continued to rise. Nephrology was consulted and felt her acute kidney injury was most likely due to volume depletion from possible overdiuresis. As result, she was  given intravenous fluids with improvement of her creatinine. It is likely that a combination of overdiuresis with Lasix along with ARB led to her acute kidney injury. Likely component of cardiorenal also.  Her creatinine remains stable.  ?Steroid psychosis In the middle of  the hospitalization, the patient became confused and more drowsy after receiving a higher dose of prednisone 60 mg. Per her PCP, she has had changes in her mental status secondary to steroids in the past. As result, her home prednisone dose was resumed with improvement of her mental status.  Hypertension The patient was found to be hypotensive to normotensive off of her home blood pressure medications.  Cardiology recommends not restarting home hypertensive meds and continuing on torsemide once daily.   DMII She reported taking Levemir 28 units in the morning and 20 units at night in addition to sliding scale insulin at home. To increase compliance, her Levemir dose was consolidated to once daily dosing at bedtime.  Obesity hypoventilation syndrome Patient had a tracheostomy placed in April 2015, and she has been unable to wean off the collar since then. She has been using a nasal cannula at home, which may have contributed to her shortness of breath. She was maintained on her trach collar during the hospitalization with good oxygen saturations. Oxygen was titrated to maintain a oxygen saturation of 90-92%.  Abdominal pain Pt had abdominal pain thought to be a UTI vs. constipation.  Abd XR revealed a moderate volume of stool.  It improved with a BM and also with a short course of pyridium.    UTI Initially treated with ceftriaxone but transitioned to keflex for a total of 7 days.    Discharge Vitals:   BP 102/61 mmHg  Pulse 93  Temp(Src) 98.3 F (36.8 C) (Oral)  Resp 18  Ht 5\' 1"  (1.549 m)  Wt 113.762 kg (250 lb 12.8 oz)  BMI 47.41 kg/m2  SpO2 99%  LMP 07/25/2014  Discharge Labs:  Results for orders placed or performed during the hospital encounter of 08/08/14 (from the past 24 hour(s))  Glucose, capillary     Status: Abnormal   Collection Time: 08/14/14  4:14 PM  Result Value Ref Range   Glucose-Capillary 260 (H) 70 - 99 mg/dL   Comment 1 Notify RN   Glucose, capillary      Status: Abnormal   Collection Time: 08/14/14  9:39 PM  Result Value Ref Range   Glucose-Capillary 195 (H) 70 - 99 mg/dL  Basic metabolic panel     Status: Abnormal   Collection Time: 08/15/14  3:44 AM  Result Value Ref Range   Sodium 139 135 - 145 mmol/L   Potassium 3.6 3.5 - 5.1 mmol/L   Chloride 92 (L) 96 - 112 mmol/L   CO2 36 (H) 19 - 32 mmol/L   Glucose, Bld 220 (H) 70 - 99 mg/dL   BUN 26 (H) 6 - 23 mg/dL   Creatinine, Ser 1.43 (H) 0.50 - 1.10 mg/dL   Calcium 8.8 8.4 - 10.5 mg/dL   GFR calc non Af Amer 43 (L) >90 mL/min   GFR calc Af Amer 50 (L) >90 mL/min   Anion gap 11 5 - 15  Glucose, capillary     Status: Abnormal   Collection Time: 08/15/14  8:07 AM  Result Value Ref Range   Glucose-Capillary 152 (H) 70 - 99 mg/dL  Glucose, capillary     Status: Abnormal   Collection Time: 08/15/14 11:57 AM  Result Value Ref Range  Glucose-Capillary 209 (H) 70 - 99 mg/dL    Signed: Jones Bales, MD 08/15/2014, 12:27 PM    Services Ordered on Discharge: [facility] PT, OT, RN, aide.  Equipment Ordered on Discharge: Rolling walker.

## 2014-08-13 NOTE — Trach Care Team (Signed)
Trach Care Progression Note   Patient Details Name: BRETTE PICCINI MRN: [id number] DOB: 05-11-1970 Today's Date: 08/13/2014   Tracheostomy Assessment    Tracheostomy Shiley 6 mm Uncuffed (Active)  Status Secured 08/13/2014  9:22 AM  Site Assessment Clean;Dry 08/13/2014  8:00 AM  Site Care Cleansed 08/12/2014 12:00 PM  [place] Cleansed/dried 08/12/2014 12:00 PM  Ties Assessment Secure 08/13/2014  9:22 AM  Cuff pressure (cm) 0 cm 08/13/2014  8:00 AM  Emergency Equipment at bedside Yes 08/13/2014  9:22 AM     Care Needs     Respiratory Therapy O2 Device: Tracheostomy Collar FiO2 (%): 28 % SpO2: 96 % Education:  (none needed) Follow up recommendations:  (chronic trach ) Respiratory barriers to progression:  (pt is already followed in trach clinic)    Speech Language Pathology      Physical Therapy Ambulation/Gait assistance: Supervision PT Recommendation/Assessment: Patient needs continued PT services Follow Up Recommendations: Home health PT PT equipment: Rolling walker with 5" wheels    Occupational Therapy OT Recommendation/Assessment: Patient needs continued OT Services Follow Up Recommendations: Home health OT    Nutritional Patient's Current Diet: Carb modified, Cardiac    Case Management/Social Work      Provider [facility] Team/Provider Recommendations [facility] Team Members Present-  Ciro Backer, RT, Alvino Blood, SW, Molli Barrows, RD  [name], NP 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 No trach needs at present.          Pax Reasoner, Jaci Carrel (scribe for team) 08/13/2014, 1:47 PM

## 2014-08-13 NOTE — Progress Notes (Signed)
Subjective:    She is more alert and oriented this morning. She reports feeling well other than some lower abdominal pain that started yesterday. She reports as having a bowel movement 2 days ago. She can need eat a regular diet at this time.  Interval Events: -Right heart cath yesterday showed moderate pulmonary hypertension with a PA pressure of 68/24. -Good diuresis yesterday with -2.4 L out.  -Poor urinary output after catheterization yesterday. Foley still in place. -Bladder scan 60s and 80s. -Given Tylenol and morphine for abdominal pain. -Moderate stool on 2 view abdominal x-ray, started on MiraLAX.    Objective:    Vital Signs:   Temp:  [98 F (36.7 C)-98.8 F (37.1 C)] 98.8 F (37.1 C) (04/14 0831) Pulse Rate:  [84-119] 101 (04/14 0831) Resp:  [16-20] 16 (04/14 0831) BP: (94-145)/(55-90) 94/68 mmHg (04/14 0831) SpO2:  [94 %-100 %] 96 % (04/14 0941) FiO2 (%):  [28 %-60 %] 28 % (04/14 0941) Weight:  [250 lb 14.1 oz (113.8 kg)] 250 lb 14.1 oz (113.8 kg) (04/14 0555) Last BM Date: 08/11/14  24-hour weight change: Weight change: -3 lb 9.8 oz (-1.64 kg)  Intake/Output:   Intake/Output Summary (Last 24 hours) at 08/13/14 1057 Last data filed at 08/13/14 1026  Gross per 24 hour  Intake   1198 ml  Output   3250 ml  Net  -2052 ml      Physical Exam: General: Trach collar in place, alert and oriented, in mild pain.  Lungs:  Normal work of breathing, mild rhonchi bilaterally.   Heart: RRR. S1 and S2 normal without gallop, murmur, or rubs.  Abdomen:  BS normoactive. Soft, Nondistended.  Tender in lower abdomen and panus.  Extremities: Trace edema bilaterally.     Labs:  Basic Metabolic Panel:  Recent Labs Lab 08/07/14 1653  08/09/14 1600  08/10/14 1220 08/10/14 1820 08/11/14 0510 08/12/14 0358 08/13/14 0830  NA 137  < >  --   < > 136 135 142 144 140  K 5.8*  < >  --   < > 5.5* 5.2* 4.9 4.7 4.0  CL 96  < >  --   < > 97 96 101 101 94*  CO2 30  < >  --    < > 25 30 34* 35* 36*  GLUCOSE 233*  < >  --   < > 292* 356* 210* 287* 233*  BUN 42*  < >  --   < > 64* 61* 51* 28* 25*  CREATININE 2.69*  < >  --   < > 3.90* 3.45* 2.27* 1.34* 2.02*  CALCIUM 8.7  < >  --   < > 8.2* 8.2* 8.3* 9.1 9.0  MG 1.2*  --  1.4*  --   --   --   --   --   --   < > = values in this interval not displayed.  Liver Function Tests:  Recent Labs Lab 08/08/14 2323  AST 18  ALT 15  ALKPHOS 87  BILITOT 0.9  PROT 7.1  ALBUMIN 3.3*   CBC:  Recent Labs Lab 08/08/14 2323 08/09/14 0540 08/10/14 0405 08/11/14 0510 08/12/14 0358  WBC 10.4 10.7* 9.8 9.0 5.9  NEUTROABS 7.1  --   --   --   --   HGB 11.5* 10.9* 10.3* 10.9* 10.7*  HCT 38.2 37.0 35.5* 37.9 36.1  MCV 97.2 98.9 100.3* 100.8* 101.4*  PLT 268 256 251 229 217    Cardiac Enzymes:  Recent Labs Lab 08/08/14 2323  TROPONINI <0.03   CBG:  Recent Labs Lab 08/12/14 0551 08/12/14 1107 08/12/14 1625 08/12/14 2117 08/13/14 0615  GLUCAP 222* 159* 142* 220* 216*   ABG    Component Value Date/Time   PHART 7.382 08/12/2014 1145   PCO2ART 62.6* 08/12/2014 1145   PO2ART 61.5* 08/12/2014 1145   HCO3 36.4* 08/12/2014 1145   TCO2 38.3 08/12/2014 1145   O2SAT 91.4 08/12/2014 1145   Microbiology: Results for orders placed or performed during the hospital encounter of 08/08/14  Culture, Urine     Status: None   Collection Time: 08/09/14 12:55 PM  Result Value Ref Range Status   Specimen Description URINE, RANDOM  Final   Special Requests NONE  Final   Colony Count   Final    >=100,000 COLONIES/ML Performed at [facility]    Culture   Final    ESCHERICHIA COLI Performed at [facility]    Report Status 08/11/2014 FINAL  Final   Organism ID, Bacteria ESCHERICHIA COLI  Final      Susceptibility   Escherichia coli - MIC*    AMPICILLIN <=2 SENSITIVE Sensitive     CEFAZOLIN <=4 SENSITIVE Sensitive     CEFTRIAXONE <=1 SENSITIVE Sensitive     CIPROFLOXACIN <=0.25 SENSITIVE  Sensitive     GENTAMICIN <=1 SENSITIVE Sensitive     LEVOFLOXACIN <=0.12 SENSITIVE Sensitive     NITROFURANTOIN <=16 SENSITIVE Sensitive     TOBRAMYCIN <=1 SENSITIVE Sensitive     TRIMETH/SULFA <=20 SENSITIVE Sensitive     PIP/TAZO <=4 SENSITIVE Sensitive     * ESCHERICHIA COLI   Imaging: Dg Abd Portable 1v  08/13/2014   CLINICAL DATA:  Lower abdominal pain since this evening.  EXAM: PORTABLE ABDOMEN - 1 VIEW  COMPARISON:  None.  FINDINGS: The bowel gas pattern is normal. Moderate volume of colonic stool. No radio-opaque calculi. No evidence of free air. Left lateral abdomen excluded from the field of view. No acute osseous abnormalities seen.  IMPRESSION: Normal bowel gas pattern. There is a moderate volume of stool throughout the colon.   Electronically Signed   By: Jeb Levering M.D.   On: 08/13/2014 03:30       Medications:    Infusions:    Scheduled Medications: . atorvastatin  20 mg Oral q1800  . budesonide (PULMICORT) nebulizer solution  0.25 mg Nebulization BID  . cefTRIAXone (ROCEPHIN)  IV  2 g Intravenous Q24H  . docusate sodium  100 mg Oral BID  . famotidine  20 mg Oral Daily  . ferrous sulfate  325 mg Oral Q breakfast  . heparin  5,000 Units Subcutaneous 3 times per day  . insulin aspart  0-20 Units Subcutaneous TID WC  . insulin aspart  0-5 Units Subcutaneous QHS  . insulin aspart  4 Units Subcutaneous TID WC  . insulin detemir  40 Units Subcutaneous QHS  . ipratropium-albuterol  3 mL Nebulization Q6H  . polyethylene glycol  17 g Oral Daily  . sodium chloride  3 mL Intravenous Q12H  . sodium chloride  3 mL Intravenous Q12H  . 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  Cardiology recommends slow diuresis with torsemide. They do not think she would benefit from pulmonary vasodilators at this time. Recommend weight loss. Creatinine elevated this morning, possibly from being NPO yesterday vs. Cardiorenal.  Pressures also a bit soft, possibly from volume overload in R ventricle.  Will give dose of torsemide this morning and monitor pressures today and creatinine tomorrow. -Appreciate cardiology recommendations. -Torsemide 20 mg twice a day. -Prednisone 10 mg and 20 mg alternating. -Continue strict ins and outs. -Daily weights. -PT and OT evaluation. -Card modified/heart healthy diet.  #Acute on chronic kidney disease Creatinine up as above.  Poor urine output last night, but good diuresis overall.  We will monitor urine output after torsemide this morning, and we will determine if she should continue to have the Foley catheter. -Continue Foley catheter for now. -Continue to monitor creatinine. -Diuresis as above. -Monitor urine output after torsemide this morning.  #Abdominal pain Reports lower abdominal pain. No bowel movement since yesterday. Also, poor urine output, but no evidence of hypertension on bladder scan. Possibly related to constipation given findings on abdominal x-ray. -MiraLAX daily. -Dulcolax suppository when necessary. -Tylenol 650 mg every 6 hours as needed. -Morphine 1 mg every 4 hours as needed.  #Altered mental status Resolve this morning. Likely steroid psychosis versus CO2 retention. -Continue to monitor. -Wean oxygen to goal pulse ox of 90-92%.  #Urinary tract infection -Continue ceftriaxone IV, day 5. -Consider switching to oral antibiotics today.  #Obesity hypoventilation syndrome -Continue oxygen supplement vision via trach collar as needed. Okay to transition to nasal cannula. -Continue DuoNeb's every 6 hours. -Continue Pulmicort nebulization twice a day.  #Hypertension Blood pressure  normotensive off of home blood pressure medications. Consider salt sensitive hypertension -Continue to hold home Coreg 12.5 mg twice a day, amlodipine 5 mg daily, and irbesartan 150 mg daily.  #Type 2 diabetes Blood sugars slightly elevated this morning. -Continue Levemir 40 units daily at bedtime. -Start NovoLog 4 units 3 times a day with meals. -Continue sliding scale insulin resistant.  #Hyperlipidemia -Continue home atorvastatin 20 mg daily.   DVT PPX - heparin  CODE STATUS - Full.  CONSULTS PLACED - Nephrology.  DISPO - Disposition is deferred at this time, awaiting improvement of right heart failure.   Anticipated discharge in approximately 1-3 day(s).   The patient does have a current PCP Jerene Pitch, MD) and does need an [facility] hospital follow-up appointment after discharge.    Is the [facility] hospital follow-up appointment a one-time only appointment? no.  Does the patient have transportation limitations that hinder transportation to clinic appointments? yes   SERVICE NEEDED AT [facility]         Y = Yes, Blank = No PT:   OT:   RN:   Equipment:   Other:      Length of Stay: 4 day(s)   Signed: Charlesetta Shanks, MD  PGY-1, Internal Medicine Resident Pager: [phone] (7AM-5PM) 08/13/2014, 10:57 AM

## 2014-08-13 NOTE — Progress Notes (Addendum)
Patient Name: Melody Gray Date of Encounter: 08/13/2014    SUBJECTIVE Pt complaining of abdominal pain this am- Abd XR done at 2 am- no obvious obstruction.   CURRENT MEDS . atorvastatin  20 mg Oral q1800  . budesonide (PULMICORT) nebulizer solution  0.25 mg Nebulization BID  . cefTRIAXone (ROCEPHIN)  IV  2 g Intravenous Q24H  . docusate sodium  100 mg Oral BID  . famotidine  20 mg Oral Daily  . ferrous sulfate  325 mg Oral Q breakfast  . heparin  5,000 Units Subcutaneous 3 times per day  . insulin aspart  0-20 Units Subcutaneous TID WC  . insulin aspart  0-5 Units Subcutaneous QHS  . insulin aspart  4 Units Subcutaneous TID WC  . insulin detemir  40 Units Subcutaneous QHS  . ipratropium-albuterol  3 mL Nebulization Q6H  . polyethylene glycol  17 g Oral Daily  . sodium chloride  3 mL Intravenous Q12H  . sodium chloride  3 mL Intravenous Q12H  . torsemide  20 mg Oral BID    OBJECTIVE  Filed Vitals:   08/13/14 0555 08/13/14 0831 08/13/14 0922 08/13/14 0941  BP: 114/55 94/68    Pulse: 100 101    Temp: 98 F (36.7 C) 98.8 F (37.1 C)    TempSrc: Axillary Oral    Resp: 20 16    Height:      Weight: 250 lb 14.1 oz (113.8 kg)     SpO2: 100% 97% 94% 96%    Intake/Output Summary (Last 24 hours) at 08/13/14 1002 Last data filed at 08/13/14 0903  Gross per 24 hour  Intake   1198 ml  Output   3150 ml  Net  -1952 ml   Filed Weights   08/11/14 1925 08/12/14 0514 08/13/14 0555  Weight: 254 lb 8 oz (115.44 kg) 247 lb 12.8 oz (112.401 kg) 250 lb 14.1 oz (113.8 kg)    PHYSICAL EXAM  General: Pleasant, NAD. Neuro: Alert and oriented X 3. Moves all extremities spontaneously.Morbidly obese, trach and O2 in place. Psych: Normal affect.  Lungs: diminished breath sounds bilateraly Heart: RRR no s3, s4, or murmurs. Abdomen: morbid obesity Extremities: No clubbing or cyanosis, trace edema.   CBC  Recent Labs  08/11/14 0510 08/12/14 0358  WBC 9.0 5.9  HGB 10.9*  10.7*  HCT 37.9 36.1  MCV 100.8* 101.4*  PLT 229 [id number]   Basic Metabolic Panel  Recent Labs  08/12/14 0358 08/13/14 0830  NA 144 140  K 4.7 4.0  CL 101 94*  CO2 35* 36*  GLUCOSE 287* 233*  BUN 28* 25*  CREATININE 1.34* 2.02*  CALCIUM 9.1 9.0   Liver Function Tests No results for input(s): AST, ALT, ALKPHOS, BILITOT, PROT, ALBUMIN in the last 72 hours. No results for input(s): LIPASE, AMYLASE in the last 72 hours. Cardiac Enzymes No results for input(s): CKTOTAL, CKMB, CKMBINDEX, TROPONINI in the last 72 hours. BNP Invalid input(s): POCBNP D-Dimer No results for input(s): DDIMER in the last 72 hours. Hemoglobin A1C No results for input(s): HGBA1C in the last 72 hours. Fasting Lipid Panel No results for input(s): CHOL, HDL, LDLCALC, TRIG, CHOLHDL, LDLDIRECT in the last 72 hours. Thyroid Function Tests No results for input(s): TSH, T4TOTAL, T3FREE, THYROIDAB in the last 72 hours.  Invalid input(s): FREET3  TELE Normal sinus rhythm, sinus tach     Radiology/Studies Dg Abd Portable 1v  08/13/2014   CLINICAL DATA:  Lower abdominal pain since this evening.  EXAM: PORTABLE  ABDOMEN - 1 VIEW  COMPARISON:  None.  FINDINGS: The bowel gas pattern is normal. Moderate volume of colonic stool. No radio-opaque calculi. No evidence of free air. Left lateral abdomen excluded from the field of view. No acute osseous abnormalities seen.  IMPRESSION: Normal bowel gas pattern. There is a moderate volume of stool throughout the colon.   Electronically Signed   By: Jeb Levering M.D.   On: 08/13/2014 03:30    ASSESSMENT AND PLAN  1 right heart failure-difficult situation. She has sarcoidosis, obstructive sleep apnea, and morbid obesity hypoventilation syndrome contributing to her pulmonary hypertension/right heart failure. Her renal function was acutely worse.   2 pulmonary hypertension-moderate at Rt heart cath 08/11/13 with elevated CVP. Most likely secondary to sarcoidosis, obstructive  sleep apnea and obesity hypoventilation syndrome. Tracheostomy is in place. Selective pulmonary vasodilators not recomended.  . 3 acute on chronic renal failure-possibly related to overdiuresis. Probable contribution from decreased perfusion. Nephrology following. Torsemide started 08/12/14.   4 obesity hypoventilation syndrome-needs weight loss.  5 hypertension-blood pressure normal to slightly elevated today. Blood pressure medications are on hold. Follow and adjust as needed.  6 sarcoid-continue steroids.  7. Abdominal pain  Signed, Erlene Quan PA  Patient seen.  Currently she is alert.  She is sitting up in the chair and has been getting some passive physical therapy to her lower extremities.  Denies any chest discomfort.  Her lungs are clear except for diminished breath sounds at the bases. Her renal function is not as good today.  I will cut back on her torsemide to just once a day.

## 2014-08-13 NOTE — Progress Notes (Signed)
Physical Therapy Treatment Patient Details Name: Melody Gray MRN: [id number] DOB: 1971-03-04 Today's Date: 08/13/2014    History of Present Illness Patient is a 44 year old woman with history of congestive heart failure, CKD, sarcoidosis, hypertension, diabetes, obesity hypoventilation syndrome status post tracheostomy, and other problems as outlined in the medical history, admitted with complaint of progressively worsening shortness of breath and decreased urine output     PT Comments    Pt required increased encouragement and reassurance for participation today but continues to be able to walk in hall and perform transfers. Pt with FiO2 down to 28% with drop in O2 sats to 88% with ambulation and recovery to 96% at rest on 28% trach collar. Pt encouraged to continue mobility OOB and to increase HEP. Pt in chair with family end of session.   Follow Up Recommendations  Home health PT     Equipment Recommendations  Rolling walker with 5" wheels    Recommendations for Other Services       Precautions / Restrictions Precautions Precautions: Fall    Mobility  Bed Mobility Overal bed mobility: Modified Independent             General bed mobility comments: use of rail and momentum but able to achieve without assist with HOB 20degrees  Transfers       Sit to Stand: Supervision         General transfer comment: cues for hand placement when RW present  Ambulation/Gait Ambulation/Gait assistance: Supervision Ambulation Distance (Feet): 200 Feet Assistive device: Rolling walker (2 wheeled) Gait Pattern/deviations: Step-through pattern;Decreased stride length;Trunk flexed   Gait velocity interpretation: Below normal speed for age/gender General Gait Details: 4 standing rest breaks with gait with increased speed with use of RW today   Stairs            Wheelchair Mobility    Modified Rankin (Stroke Patients Only)       Balance                                    Cognition Arousal/Alertness: Awake/alert Behavior During Therapy: WFL for tasks assessed/performed Overall Cognitive Status: Within Functional Limits for tasks assessed                      Exercises General Exercises - Lower Extremity Long Arc Quad: AROM;Seated;Both;15 reps Hip Flexion/Marching: AROM;Seated;Both;15 reps    General Comments        Pertinent Vitals/Pain Pain Assessment: 0-10 Pain Score: 5  Pain Location: pain at catheter and lower abdomen Pain Descriptors / Indicators: Aching Pain Intervention(s): Repositioned;Limited activity within patient's tolerance    Home Living                      Prior Function            PT Goals (current goals can now be found in the care plan section) Progress towards PT goals: Progressing toward goals    Frequency       PT Plan Current plan remains appropriate    Co-evaluation             End of Session Equipment Utilized During Treatment: Oxygen Activity Tolerance: Patient tolerated treatment well Patient left: in chair;with call bell/phone within reach;with family/visitor present     Time: [id number] PT Time Calculation (min) (ACUTE ONLY): 24 min  Charges:  $Gait Training: 8-22 mins $Therapeutic  Exercise: 8-22 mins                    G Codes:      Melford Aase 2014-09-06, 1:32 PM Elwyn Reach, [place]

## 2014-08-13 NOTE — Progress Notes (Signed)
Patient seen and examined. Case d/w residents in detail. I agree with findings and plan as documented in Dr. Vivia Budge note.  Patient MS is back at baseline. Mild confusion yesterday likely secondary to increased steroid dose. Will monitor on home dose of steroids for sarcoidosis.  Pt now s/p RHC with elevated PA pressures and elevated CVP. Started on PO torsemide once a day. Will monitor Cr closely on diuretics.   Patient also noted to have poor urine output last night and today. Would c/w Foley for now and monitor output on diuresis. Repeat BMP in AM. Cr mildly worse today.  Would d/c ceftriaxone and start PO abx to complete 7 day course of abx

## 2014-08-14 DIAGNOSIS — F458 Other somatoform disorders: Secondary | ICD-10-CM

## 2014-08-14 DIAGNOSIS — R109 Unspecified abdominal pain: Secondary | ICD-10-CM

## 2014-08-14 DIAGNOSIS — I120 Hypertensive chronic kidney disease with stage 5 chronic kidney disease or end stage renal disease: Secondary | ICD-10-CM

## 2014-08-14 LAB — BASIC METABOLIC PANEL
Anion gap: 11 (ref 5–15)
BUN: 26 mg/dL — ABNORMAL HIGH (ref 6–23)
CO2: 35 mmol/L — ABNORMAL HIGH (ref 19–32)
Calcium: 8.8 mg/dL (ref 8.4–10.5)
Chloride: 93 mmol/L — ABNORMAL LOW (ref 96–112)
Creatinine, Ser: 1.63 mg/dL — ABNORMAL HIGH (ref 0.50–1.10)
GFR calc Af Amer: 43 mL/min — ABNORMAL LOW (ref >90)
GFR calc non Af Amer: 37 mL/min — ABNORMAL LOW (ref >90)
Glucose, Bld: 151 mg/dL — ABNORMAL HIGH (ref 70–99)
Potassium: 3.3 mmol/L — ABNORMAL LOW (ref 3.5–5.1)
Sodium: 139 mmol/L (ref 135–145)

## 2014-08-14 LAB — POCT I-STAT 3, VENOUS BLOOD GAS (G3P V)
Acid-Base Excess: 10 mmol/L — ABNORMAL HIGH (ref 0.0–2.0)
Acid-Base Excess: 10 mmol/L — ABNORMAL HIGH (ref 0.0–2.0)
Acid-Base Excess: 11 mmol/L — ABNORMAL HIGH (ref 0.0–2.0)
Bicarbonate: 38.5 mEq/L — ABNORMAL HIGH (ref 20.0–24.0)
Bicarbonate: 38.7 mEq/L — ABNORMAL HIGH (ref 20.0–24.0)
Bicarbonate: 40.7 mEq/L — ABNORMAL HIGH (ref 20.0–24.0)
O2 Saturation: 62 %
O2 Saturation: 62 %
O2 Saturation: 65 %
TCO2: 41 mmol/L (ref 0–100)
TCO2: 41 mmol/L (ref 0–100)
TCO2: 43 mmol/L (ref 0–100)
pCO2, Ven: 77.9 mmHg (ref 45.0–50.0)
pCO2, Ven: 78.1 mmHg (ref 45.0–50.0)
pCO2, Ven: 82.1 mmHg (ref 45.0–50.0)
pH, Ven: 7.301 — ABNORMAL HIGH (ref 7.250–7.300)
pH, Ven: 7.303 — ABNORMAL HIGH (ref 7.250–7.300)
pH, Ven: 7.304 — ABNORMAL HIGH (ref 7.250–7.300)
pO2, Ven: 37 mmHg (ref 30.0–45.0)
pO2, Ven: 38 mmHg (ref 30.0–45.0)
pO2, Ven: 39 mmHg (ref 30.0–45.0)

## 2014-08-14 LAB — GLUCOSE, CAPILLARY
Glucose-Capillary: 144 mg/dL — ABNORMAL HIGH (ref 70–99)
Glucose-Capillary: 160 mg/dL — ABNORMAL HIGH (ref 70–99)
Glucose-Capillary: 260 mg/dL — ABNORMAL HIGH (ref 70–99)
Glucose-Capillary: 195 mg/dL — ABNORMAL HIGH (ref 70–99)

## 2014-08-14 MED ORDER — POTASSIUM CHLORIDE CRYS ER 20 MEQ PO TBCR
40.0000 meq | EXTENDED_RELEASE_TABLET | Freq: Once | ORAL | Status: AC
  Administered 2014-08-14: 40 meq via ORAL
  Filled 2014-08-14: qty 2

## 2014-08-14 MED ORDER — TORSEMIDE 20 MG PO TABS
20.0000 mg | ORAL_TABLET | Freq: Two times a day (BID) | ORAL | Status: DC
  Administered 2014-08-14 – 2014-08-15 (×3): 20 mg via ORAL
  Filled 2014-08-14 (×6): qty 1

## 2014-08-14 NOTE — Progress Notes (Signed)
Subjective: Feeling better. Abdominal pain resolved.   Objective: Vital signs in last 24 hours: Temp:  [97.8 F (36.6 C)-98.3 F (36.8 C)] 97.8 F (36.6 C) (04/15 0541) Pulse Rate:  [90-132] 109 (04/15 1007) Resp:  [16-18] 16 (04/15 1007) BP: (100-156)/(56-90) 100/56 mmHg (04/15 0541) SpO2:  [92 %-98 %] 98 % (04/15 1007) FiO2 (%):  [28 %] 28 % (04/15 1007) Weight:  [251 lb 6.4 oz (114.034 kg)] 251 lb 6.4 oz (114.034 kg) (04/15 0541) Last BM Date: 08/13/14  Intake/Output from previous day: 04/14 0701 - 04/15 0700 In: 360 [P.O.:360] Out: 1000 [Urine:1000] Intake/Output this shift: Total I/O In: 240 [P.O.:240] Out: 100 [Urine:100]  Medications Current Facility-Administered Medications  Medication Dose Route Frequency Provider Last Rate Last Dose  . 0.9 %  sodium chloride infusion  250 mL Intravenous PRN Jolaine Artist, MD      . acetaminophen (TYLENOL) tablet 650 mg  650 mg Oral Q6H PRN Langley Gauss Moding, MD      . atorvastatin (LIPITOR) tablet 20 mg  20 mg Oral q1800 Corky Sox, MD   20 mg at 08/13/14 1827  . bisacodyl (DULCOLAX) suppository 10 mg  10 mg Rectal Daily PRN Langley Gauss Moding, MD   10 mg at 08/13/14 1511  . budesonide (PULMICORT) nebulizer solution 0.25 mg  0.25 mg Nebulization BID Jones Bales, MD   0.25 mg at 08/14/14 0959  . cephALEXin (KEFLEX) capsule 500 mg  500 mg Oral Q12H Langley Gauss Moding, MD   500 mg at 08/13/14 2245  . docusate sodium (COLACE) capsule 100 mg  100 mg Oral BID Jones Bales, MD   100 mg at 08/13/14 2245  . famotidine (PEPCID) tablet 20 mg  20 mg Oral Daily Jones Bales, MD   20 mg at 08/13/14 1034  . ferrous sulfate tablet 325 mg  325 mg Oral Q breakfast Nischal Narendra, MD   325 mg at 08/13/14 1041  . heparin injection 5,000 Units  5,000 Units Subcutaneous 3 times per day Jolaine Artist, MD   5,000 Units at 08/14/14 0700  . insulin aspart (novoLOG) injection 0-20 Units  0-20 Units Subcutaneous TID WC Jones Bales, MD   3 Units at 08/14/14 0708  . insulin aspart (novoLOG) injection 0-5 Units  0-5 Units Subcutaneous QHS Jones Bales, MD   2 Units at 08/12/14 2300  . insulin aspart (novoLOG) injection 4 Units  4 Units Subcutaneous TID WC Charlesetta Shanks, MD   4 Units at 08/14/14 [phone]  . insulin detemir (LEVEMIR) injection 40 Units  40 Units Subcutaneous QHS Charlesetta Shanks, MD   40 Units at 08/13/14 2247  . ipratropium-albuterol (DUONEB) 0.5-2.5 (3) MG/3ML nebulizer solution 3 mL  3 mL Nebulization Q6H Jones Bales, MD   3 mL at 08/14/14 1005  . ondansetron (ZOFRAN) injection 4 mg  4 mg Intravenous Q6H PRN Jolaine Artist, MD      . polyethylene glycol (MIRALAX / GLYCOLAX) packet 17 g  17 g Oral Daily Cresenciano Genre, MD   17 g at 08/13/14 1034  . potassium chloride SA (K-DUR,KLOR-CON) CR tablet 40 mEq  40 mEq Oral Once Langley Gauss Moding, MD      . predniSONE (DELTASONE) tablet 5 mg  5 mg Oral QODAY Langley Gauss Moding, MD   5 mg at 08/13/14 1827   And  . predniSONE (DELTASONE) tablet 10 mg  10 mg Oral QODAY Charlesetta Shanks, MD      .  sodium chloride 0.9 % injection 3 mL  3 mL Intravenous Q12H Corky Sox, MD   3 mL at 08/13/14 2252  . sodium chloride 0.9 % injection 3 mL  3 mL Intravenous Q12H Jolaine Artist, MD   3 mL at 08/13/14 2252  . sodium chloride 0.9 % injection 3 mL  3 mL Intravenous PRN Jolaine Artist, MD      . torsemide [facility]) tablet 20 mg  20 mg Oral BID Langley Gauss Moding, MD      . traMADol Veatrice Bourbon) tablet 50 mg  50 mg Oral Q12H PRN Charlesetta Shanks, MD   50 mg at 08/14/14 0200    PE: General appearance: alert, cooperative and no distress Lungs: Mild diffuse wheeze Heart: Reg rhythm, rate elevated No MRG Extremities: 1+ LEE Pulses: 2+ and symmetric Skin: warm and dry Neurologic: Grossly normal  Lab Results:   Recent Labs  08/12/14 0358  WBC 5.9  HGB 10.7*  HCT 36.1  PLT 217   BMET  Recent Labs  08/12/14 0358 08/13/14 0830 08/14/14 0504  NA 144  140 139  K 4.7 4.0 3.3*  CL 101 94* 93*  CO2 35* 36* 35*  GLUCOSE 287* 233* 151*  BUN 28* 25* 26*  CREATININE 1.34* 2.02* 1.63*  CALCIUM 9.1 9.0 8.8   PT/INR  Recent Labs  08/12/14 0358  LABPROT 14.6  INR 1.13    Assessment/Plan   Active Problems:   Acute on chronic diastolic CHF (congestive heart failure)   Acute kidney failure   Acute pulmonary edema   Pulmonary hypertension   1 right heart failure-difficult situation.  Net fluids: -0.6L/-3.9L.  Weight fluctuating 247-251 but stable.  On torsemide 20mg  BID. EF 55-60% and normal diastolic parameters. She has sarcoidosis, obstructive sleep apnea, and morbid obesity hypoventilation syndrome contributing to her pulmonary hypertension/right heart failure. Her renal function has improved since yesterday. One dose of torsemide 20mg  yesterday and back on BID today.     2 pulmonary hypertension- moderate at Rt heart cath 08/11/13 with elevated CVP. Most likely secondary to sarcoidosis, obstructive sleep apnea and obesity hypoventilation syndrome. Tracheostomy is in place. Selective pulmonary vasodilators not recomended.  . 3 acute on chronic renal failure- SCr improved this morning.       4 obesity hypoventilation syndrome-needs weight loss.  5 hypertension- blood pressure a little low this morning. Blood pressure medications are on hold. Follow and adjust as needed.  6 sarcoid-continue steroids.  7. Abdominal pain  resolved  9.  Hypokalemia  replaced   LOS: 5 days    HAGER, BRYAN PA-C 08/14/2014 10:59 AM  Agree with above assessment and plan. She looks and feels much better today. Dyspnea is better. Lower abdominal pain resolved after the Foley catheter was removed.  Voiding okay without catheter. Lungs reveal good aeration sitting up. No wheezes. Back on torsemide 20 mg today. Recheck BMET in am

## 2014-08-14 NOTE — Progress Notes (Signed)
Physical Therapy Treatment Patient Details Name: Melody Gray MRN: [id number] DOB: 04/10/1971 Today's Date: 08/14/2014    History of Present Illness Patient is a 44 year old woman with history of congestive heart failure, CKD, sarcoidosis, hypertension, diabetes, obesity hypoventilation syndrome status post tracheostomy, and other problems as outlined in the medical history, admitted with complaint of progressively worsening shortness of breath and decreased urine output     PT Comments    Pt moving much better today with increased activity tolerance, gait speed and balance. Pt with sats 89-96% throughout on 28% trach collar. HR 120 with gait, 130 with HEP and 110 at rest. Pt encouraged to continue HEP and ambulation and benefits from encouragement and motivation. Recommend daily ambulation over the weekend with nursing staff.   Follow Up Recommendations  Home health PT     Equipment Recommendations  Rolling walker with 5" wheels    Recommendations for Other Services       Precautions / Restrictions Precautions Precautions: Fall Precaution Comments: trach    Mobility  Bed Mobility Overal bed mobility: Modified Independent                Transfers Overall transfer level: Modified independent     Sit to Stand: Supervision            Ambulation/Gait Ambulation/Gait assistance: Supervision Ambulation Distance (Feet): 250 Feet Assistive device: Rolling walker (2 wheeled) Gait Pattern/deviations: Step-through pattern;Decreased stride length;Trunk flexed   Gait velocity interpretation: at or above normal speed for age/gender General Gait Details: pt with increased speed with gait today, maintaining mostly upright posture and only 1 standing rest today. Cues for posture and encouragement to increase gait distance   Stairs            Wheelchair Mobility    Modified Rankin (Stroke Patients Only)       Balance                                    Cognition Arousal/Alertness: Awake/alert Behavior During Therapy: WFL for tasks assessed/performed Overall Cognitive Status: Within Functional Limits for tasks assessed                      Exercises General Exercises - Lower Extremity Hip ABduction/ADduction: AROM;Both;20 reps;Standing Hip Flexion/Marching: AROM;Both;20 reps;Standing Other Exercises Other Exercises: standing leg curl x 20 AROM bil LE    General Comments        Pertinent Vitals/Pain Pain Assessment: Faces Pain Score: 1  Faces Pain Scale: Hurts little more Pain Location: lower abdomen Pain Descriptors / Indicators: Sore Pain Intervention(s): Repositioned;Monitored during session;Premedicated before session    Home Living                      Prior Function            PT Goals (current goals can now be found in the care plan section) Acute Rehab PT Goals Patient Stated Goal: be able to go home Progress towards PT goals: Goals met and updated - see care plan    Frequency       PT Plan Current plan remains appropriate    Co-evaluation             End of Session Equipment Utilized During Treatment: Oxygen Activity Tolerance: Patient tolerated treatment well Patient left: in chair;with call bell/phone within reach;with family/visitor present     Time:  [id number] PT Time Calculation (min) (ACUTE ONLY): 29 min  Charges:  $Gait Training: 8-22 mins $Therapeutic Exercise: 8-22 mins                    G Codes:      Melford Aase 10-Sep-2014, 2:59 PM Elwyn Reach, [place]

## 2014-08-14 NOTE — Progress Notes (Signed)
Subjective:    She reports feeling much better this morning with good breathing.  She still has some mild lower extremity edema that is stable. She reports mild residual abdominal pain, but it has improved dramatically.  Interval Events: -Poor urine output initially, but overall net out 1000 ml yesterday. -Had a small bowel movement after receiving suppository yesterday.   Objective:    Vital Signs:   Temp:  [97.8 F (36.6 C)-98.3 F (36.8 C)] 97.8 F (36.6 C) (04/15 0541) Pulse Rate:  [90-132] 109 (04/15 1007) Resp:  [16-18] 16 (04/15 1007) BP: (100-156)/(56-90) 100/56 mmHg (04/15 0541) SpO2:  [92 %-98 %] 98 % (04/15 1007) FiO2 (%):  [28 %] 28 % (04/15 1007) Weight:  [251 lb 6.4 oz (114.034 kg)] 251 lb 6.4 oz (114.034 kg) (04/15 0541) Last BM Date: 08/13/14  24-hour weight change: Weight change: 8.3 oz (0.234 kg)  Intake/Output:   Intake/Output Summary (Last 24 hours) at 08/14/14 1032 Last data filed at 08/14/14 0943  Gross per 24 hour  Intake    240 ml  Output   1000 ml  Net   -760 ml      Physical Exam: General: Trach collar in place, alert and oriented, in mild pain.  Lungs:  Normal work of breathing, mild rhonchi bilaterally.   Heart: RRR. S1 and S2 normal without gallop, murmur, or rubs.  Abdomen:  BS normoactive. Soft, Nondistended, mild tenderness.  Extremities: Trace edema bilaterally, stable.     Labs:  Basic Metabolic Panel:  Recent Labs Lab 08/07/14 1653  08/09/14 1600  08/10/14 1820 08/11/14 0510 08/12/14 0358 08/13/14 0830 08/14/14 0504  NA 137  < >  --   < > 135 142 144 140 139  K 5.8*  < >  --   < > 5.2* 4.9 4.7 4.0 3.3*  CL 96  < >  --   < > 96 101 101 94* 93*  CO2 30  < >  --   < > 30 34* 35* 36* 35*  GLUCOSE 233*  < >  --   < > 356* 210* 287* 233* 151*  BUN 42*  < >  --   < > 61* 51* 28* 25* 26*  CREATININE 2.69*  < >  --   < > 3.45* 2.27* 1.34* 2.02* 1.63*  CALCIUM 8.7  < >  --   < > 8.2* 8.3* 9.1 9.0 8.8  MG 1.2*  --  1.4*   --   --   --   --   --   --   < > = values in this interval not displayed.  Liver Function Tests:  Recent Labs Lab 08/08/14 2323  AST 18  ALT 15  ALKPHOS 87  BILITOT 0.9  PROT 7.1  ALBUMIN 3.3*   CBC:  Recent Labs Lab 08/08/14 2323 08/09/14 0540 08/10/14 0405 08/11/14 0510 08/12/14 0358  WBC 10.4 10.7* 9.8 9.0 5.9  NEUTROABS 7.1  --   --   --   --   HGB 11.5* 10.9* 10.3* 10.9* 10.7*  HCT 38.2 37.0 35.5* 37.9 36.1  MCV 97.2 98.9 100.3* 100.8* 101.4*  PLT 268 256 251 229 217    Cardiac Enzymes:  Recent Labs Lab 08/08/14 2323  TROPONINI <0.03   CBG:  Recent Labs Lab 08/13/14 0615 08/13/14 1146 08/13/14 1705 08/13/14 2058 08/14/14 0638  GLUCAP 216* 205* 151* 144* 144*   ABG    Component Value Date/Time   PHART 7.382 08/12/2014 1145  PCO2ART 62.6* 08/12/2014 1145   PO2ART 61.5* 08/12/2014 1145   HCO3 38.5* 08/12/2014 1746   TCO2 41 08/12/2014 1746   O2SAT 65.0 08/12/2014 1746   Microbiology: Results for orders placed or performed during the hospital encounter of 08/08/14  Culture, Urine     Status: None   Collection Time: 08/09/14 12:55 PM  Result Value Ref Range Status   Specimen Description URINE, RANDOM  Final   Special Requests NONE  Final   Colony Count   Final    >=100,000 COLONIES/ML Performed at [facility]    Culture   Final    ESCHERICHIA COLI Performed at [facility]    Report Status 08/11/2014 FINAL  Final   Organism ID, Bacteria ESCHERICHIA COLI  Final      Susceptibility   Escherichia coli - MIC*    AMPICILLIN <=2 SENSITIVE Sensitive     CEFAZOLIN <=4 SENSITIVE Sensitive     CEFTRIAXONE <=1 SENSITIVE Sensitive     CIPROFLOXACIN <=0.25 SENSITIVE Sensitive     GENTAMICIN <=1 SENSITIVE Sensitive     LEVOFLOXACIN <=0.12 SENSITIVE Sensitive     NITROFURANTOIN <=16 SENSITIVE Sensitive     TOBRAMYCIN <=1 SENSITIVE Sensitive     TRIMETH/SULFA <=20 SENSITIVE Sensitive     PIP/TAZO <=4 SENSITIVE Sensitive     *  ESCHERICHIA COLI   Imaging: Dg Abd Portable 1v  08/13/2014   CLINICAL DATA:  Lower abdominal pain since this evening.  EXAM: PORTABLE ABDOMEN - 1 VIEW  COMPARISON:  None.  FINDINGS: The bowel gas pattern is normal. Moderate volume of colonic stool. No radio-opaque calculi. No evidence of free air. Left lateral abdomen excluded from the field of view. No acute osseous abnormalities seen.  IMPRESSION: Normal bowel gas pattern. There is a moderate volume of stool throughout the colon.   Electronically Signed   By: Jeb Levering M.D.   On: 08/13/2014 03:30       Medications:    Infusions:    Scheduled Medications: . atorvastatin  20 mg Oral q1800  . budesonide (PULMICORT) nebulizer solution  0.25 mg Nebulization BID  . cephALEXin  500 mg Oral Q12H  . docusate sodium  100 mg Oral BID  . famotidine  20 mg Oral Daily  . ferrous sulfate  325 mg Oral Q breakfast  . heparin  5,000 Units Subcutaneous 3 times per day  . insulin aspart  0-20 Units Subcutaneous TID WC  . insulin aspart  0-5 Units Subcutaneous QHS  . insulin aspart  4 Units Subcutaneous TID WC  . insulin detemir  40 Units Subcutaneous QHS  . ipratropium-albuterol  3 mL Nebulization Q6H  . polyethylene glycol  17 g Oral Daily  . potassium chloride  40 mEq Oral Once  . predniSONE  5 mg Oral QODAY   And  . predniSONE  10 mg Oral QODAY  . sodium chloride  3 mL Intravenous Q12H  . sodium chloride  3 mL Intravenous Q12H  . 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  Creatinine stable, so will increase torsemide to 20 mg BID after reducing to daily yesterday. We'll keep overnight to monitor creatinine and  diuresis after removing Foley. PT recommending home health PT. -Appreciate cardiology recommendations. -Torsemide 20 mg twice a day. -Prednisone 5 mg and 10 mg alternating per home regimen. -K-Dur 40 mEq once for hypokalemia due to diuresis. -Continue strict ins and outs. -Daily weights. -Ordered home health PT and rolling walker. -Card modified/heart healthy diet. -Supplemental oxygen with goal saturation between 90 and 92%.  #Acute on chronic kidney disease Okay urine output yesterday, creatinine improved this morning. -Discontinue Foley catheter and monitor urine output. -Continue to monitor creatinine. -Diuresis as above.  #Abdominal pain Improved today, possibly related to Foley catheter. -Continue MiraLAX daily. -Continue Dulcolax suppository when necessary. -Tylenol 650 mg every 6 hours as needed. -Morphine 1 mg every 4 hours as needed.  #Urinary tract infection -Switched to cephalexin by mouth yesterday, day 6 of 7.  #Obesity hypoventilation syndrome -Continue oxygen supplement vision via trach collar as needed. Okay to transition to nasal cannula. -Continue DuoNeb's every 6 hours. -Continue Pulmicort nebulization twice a day.  #Hypertension Blood pressure normotensive off of home blood pressure medications. Consider salt sensitive hypertension. -Continue to hold home Coreg 12.5 mg twice a day, amlodipine 5 mg daily, and irbesartan 150 mg daily. -Consider starting on hydrochlorothiazide at discharge.  #Type 2 diabetes Blood sugars much improved with mealtime insulin. -Continue Levemir 40 units daily at bedtime. -Continue NovoLog 4 units 3 times a day with meals. -Continue sliding scale insulin resistant.  #Hyperlipidemia -Continue home atorvastatin 20 mg daily.   DVT PPX - heparin  CODE STATUS - Full.  CONSULTS PLACED - Nephrology.  DISPO - likely discharged tomorrow.  The patient does have a current PCP Jerene Pitch, MD) and does need an [facility]  hospital follow-up appointment after discharge.    Is the [facility] hospital follow-up appointment a one-time only appointment? no.  Does the patient have transportation limitations that hinder transportation to clinic appointments? yes   SERVICE NEEDED AT [place]         Y = Yes, Blank = No PT:  home health   OT:   RN:   Equipment:   Other:      Length of Stay: 5 day(s)   Signed: Charlesetta Shanks, MD  PGY-1, Internal Medicine Resident Pager: [phone] (7AM-5PM) 08/14/2014, 10:32 AM

## 2014-08-14 NOTE — Progress Notes (Signed)
Occupational Therapy Treatment Patient Details Name: Melody Gray MRN: [id number] DOB: 27-Nov-1970 Today's Date: 08/14/2014    History of present illness Patient is a 44 year old woman with history of congestive heart failure, CKD, sarcoidosis, hypertension, diabetes, obesity hypoventilation syndrome status post tracheostomy, and other problems as outlined in the medical history, admitted with complaint of progressively worsening shortness of breath and decreased urine output    OT comments  Pt is able to perform ADL at a set up/supervision level, but tends to rely on staff more than is necessary.  Pt educated in energy conservation strategies and benefits of using tub transfer bench instead of standing to shower.  Cognitive issues have resolved with pt at her baseline.  Follow Up Recommendations  Home health OT    Equipment Recommendations  Tub/shower bench    Recommendations for Other Services      Precautions / Restrictions Precautions Precautions: Fall       Mobility Bed Mobility Overal bed mobility: Modified Independent                Transfers Overall transfer level: Needs assistance     Sit to Stand: Supervision              Balance                                   ADL Overall ADL's : Needs assistance/impaired     Grooming: Wash/dry hands;Standing;Supervision/safety       Lower Body Bathing: Supervison/ safety;Sit to/from stand       Lower Body Dressing: Supervision/safety;Sit to/from stand   Toilet Transfer: Supervision/safety;BSC;Ambulation   Toileting- Water quality scientist and Hygiene: Sit to/from stand;Supervision/safety       Functional mobility during ADLs: Supervision/safety;Rolling walker General ADL Comments: Educated pt on benefits of sitting to shower, tub bench vs tub seat.  Called CM to order tub bench for discharge.  Pt educated in energy conservation strategies during ADL and mobility around room       Vision                     Perception     Praxis      Cognition   Behavior During Therapy: [facility] for tasks assessed/performed Overall Cognitive Status: Within Functional Limits for tasks assessed                       Extremity/Trunk Assessment               Exercises     Shoulder Instructions       General Comments      Pertinent Vitals/ Pain       Pain Assessment: Faces Faces Pain Scale: Hurts little more Pain Location: lower abdomen Pain Descriptors / Indicators: Discomfort;Grimacing Pain Intervention(s): Repositioned;Monitored during session;Premedicated before session  Home Living                                          Prior Functioning/Environment              Frequency Min 2X/week     Progress Toward Goals  OT Goals(current goals can now be found in the care plan section)  Progress towards OT goals: Progressing toward goals  Acute Rehab OT Goals Patient Stated Goal: be able to  go home  Plan Discharge plan remains appropriate    Co-evaluation                 End of Session Equipment Utilized During Treatment: Oxygen   Activity Tolerance Patient tolerated treatment well   Patient Left in bed;with call bell/phone within reach;with family/visitor present   Nurse Communication          Time: 1334-1400 OT Time Calculation (min): 26 min  Charges: OT General Charges $OT Visit: 1 Procedure OT Treatments $Self Care/Home Management : 23-37 mins  Malka So 08/14/2014, 2:41 PM  [phone]

## 2014-08-14 NOTE — Progress Notes (Signed)
Patient seen and examined. Case d/w residents in detail. I agree with findings and plan as documented in Dr. Vivia Budge note.  Patient is much better today. No new complaints. Abd pain is improving.  Cardio following for right heart failure likely secondary to [facility] from sarcoidosis. Increase torsemide to BID today. If creatinine remains stable would consider d/c in AM.  C/w home steroid dose. No further w/u for now

## 2014-08-15 LAB — BASIC METABOLIC PANEL
Anion gap: 11 (ref 5–15)
BUN: 26 mg/dL — ABNORMAL HIGH (ref 6–23)
CO2: 36 mmol/L — ABNORMAL HIGH (ref 19–32)
Calcium: 8.8 mg/dL (ref 8.4–10.5)
Chloride: 92 mmol/L — ABNORMAL LOW (ref 96–112)
Creatinine, Ser: 1.43 mg/dL — ABNORMAL HIGH (ref 0.50–1.10)
GFR calc Af Amer: 50 mL/min — ABNORMAL LOW (ref >90)
GFR calc non Af Amer: 43 mL/min — ABNORMAL LOW (ref >90)
Glucose, Bld: 220 mg/dL — ABNORMAL HIGH (ref 70–99)
Potassium: 3.6 mmol/L (ref 3.5–5.1)
Sodium: 139 mmol/L (ref 135–145)

## 2014-08-15 LAB — GLUCOSE, CAPILLARY
Glucose-Capillary: 152 mg/dL — ABNORMAL HIGH (ref 70–99)
Glucose-Capillary: 209 mg/dL — ABNORMAL HIGH (ref 70–99)
Glucose-Capillary: 289 mg/dL — ABNORMAL HIGH (ref 70–99)
Glucose-Capillary: 222 mg/dL — ABNORMAL HIGH (ref 70–99)

## 2014-08-15 MED ORDER — POTASSIUM CHLORIDE CRYS ER 20 MEQ PO TBCR
40.0000 meq | EXTENDED_RELEASE_TABLET | Freq: Once | ORAL | Status: AC
  Administered 2014-08-15: 40 meq via ORAL

## 2014-08-15 MED ORDER — CEPHALEXIN 500 MG PO CAPS: 500.0000 mg | ORAL_CAPSULE | Freq: Two times a day (BID) | ORAL | Status: DC

## 2014-08-15 MED ORDER — INSULIN DETEMIR 100 UNIT/ML ~~LOC~~ SOLN
42.0000 [IU] | Freq: Every day | SUBCUTANEOUS | Status: DC
  Administered 2014-08-15: 42 [IU] via SUBCUTANEOUS
  Filled 2014-08-15 (×2): qty 0.42

## 2014-08-15 MED ORDER — PREDNISONE 5 MG PO TABS: 5.0000 mg | ORAL_TABLET | ORAL | Status: DC

## 2014-08-15 MED ORDER — PHENAZOPYRIDINE HCL 200 MG PO TABS
200.0000 mg | ORAL_TABLET | Freq: Three times a day (TID) | ORAL | Status: AC
  Administered 2014-08-15 – 2014-08-16 (×3): 200 mg via ORAL
  Filled 2014-08-15 (×3): qty 1

## 2014-08-15 MED ORDER — DOCUSATE SODIUM 100 MG PO CAPS: 100.0000 mg | ORAL_CAPSULE | Freq: Two times a day (BID) | ORAL | Status: DC

## 2014-08-15 MED ORDER — INSULIN DETEMIR 100 UNIT/ML ~~LOC~~ SOLN: 42.0000 [IU] | Freq: Every day | SUBCUTANEOUS | Status: DC

## 2014-08-15 MED ORDER — TORSEMIDE 20 MG PO TABS
20.0000 mg | ORAL_TABLET | Freq: Every day | ORAL | Status: DC
  Administered 2014-08-16: 20 mg via ORAL
  Filled 2014-08-15: qty 1

## 2014-08-15 MED ORDER — TORSEMIDE 20 MG PO TABS: 20.0000 mg | ORAL_TABLET | Freq: Two times a day (BID) | ORAL | Status: DC

## 2014-08-15 MED ORDER — TORSEMIDE 20 MG PO TABS: 20.0000 mg | ORAL_TABLET | Freq: Every day | ORAL | Status: DC

## 2014-08-15 MED ORDER — INSULIN DETEMIR 100 UNIT/ML ~~LOC~~ SOLN: 40.0000 [IU] | Freq: Every day | SUBCUTANEOUS | Status: DC

## 2014-08-15 MED ORDER — PREDNISONE 10 MG PO TABS: 10.0000 mg | ORAL_TABLET | ORAL | Status: DC

## 2014-08-15 NOTE — Progress Notes (Signed)
Subjective: Denies dyspnea or chest pain; complains of low abd pain  Objective: Vital signs in last 24 hours: Temp:  [98.1 F (36.7 C)-98.7 F (37.1 C)] 98.3 F (36.8 C) (04/16 0558) Pulse Rate:  [93-115] 93 (04/16 0558) Resp:  [16-20] 18 (04/16 0558) BP: (102-135)/(61-88) 102/61 mmHg (04/16 0558) SpO2:  [96 %-100 %] 99 % (04/16 0750) FiO2 (%):  [28 %] 28 % (04/16 0750) Weight:  [250 lb 12.8 oz (113.762 kg)] 250 lb 12.8 oz (113.762 kg) (04/16 0558) Last BM Date: 08/14/14  Intake/Output from previous day: 04/15 0701 - 04/16 0700 In: 1000 [P.O.:1000] Out: 1352 [Urine:1350; Stool:2] Intake/Output this shift: Total I/O In: 360 [P.O.:360] Out: -   Medications Current Facility-Administered Medications  Medication Dose Route Frequency Provider Last Rate Last Dose  . 0.9 %  sodium chloride infusion  250 mL Intravenous PRN Jolaine Artist, MD      . acetaminophen (TYLENOL) tablet 650 mg  650 mg Oral Q6H PRN Charlesetta Shanks, MD   650 mg at 08/14/14 2011  . atorvastatin (LIPITOR) tablet 20 mg  20 mg Oral q1800 Corky Sox, MD   20 mg at 08/14/14 1731  . bisacodyl (DULCOLAX) suppository 10 mg  10 mg Rectal Daily PRN Langley Gauss Moding, MD   10 mg at 08/13/14 1511  . budesonide (PULMICORT) nebulizer solution 0.25 mg  0.25 mg Nebulization BID Jones Bales, MD   0.25 mg at 08/14/14 2042  . cephALEXin (KEFLEX) capsule 500 mg  500 mg Oral Q12H Langley Gauss Moding, MD   500 mg at 08/15/14 1017  . docusate sodium (COLACE) capsule 100 mg  100 mg Oral BID Jones Bales, MD   100 mg at 08/15/14 1019  . famotidine (PEPCID) tablet 20 mg  20 mg Oral Daily Jones Bales, MD   20 mg at 08/15/14 1018  . ferrous sulfate tablet 325 mg  325 mg Oral Q breakfast Nischal Narendra, MD   325 mg at 08/15/14 1016  . heparin injection 5,000 Units  5,000 Units Subcutaneous 3 times per day Jolaine Artist, MD   5,000 Units at 08/15/14 0700  . insulin aspart (novoLOG) injection 0-20 Units  0-20 Units  Subcutaneous TID WC Jones Bales, MD   4 Units at 08/15/14 0813  . insulin aspart (novoLOG) injection 0-5 Units  0-5 Units Subcutaneous QHS Jones Bales, MD   2 Units at 08/12/14 2300  . insulin aspart (novoLOG) injection 4 Units  4 Units Subcutaneous TID WC Charlesetta Shanks, MD   4 Units at 08/15/14 0730  . insulin detemir (LEVEMIR) injection 40 Units  40 Units Subcutaneous QHS Charlesetta Shanks, MD   40 Units at 08/14/14 2216  . ipratropium-albuterol (DUONEB) 0.5-2.5 (3) MG/3ML nebulizer solution 3 mL  3 mL Nebulization Q6H Jones Bales, MD   3 mL at 08/15/14 0747  . ondansetron (ZOFRAN) injection 4 mg  4 mg Intravenous Q6H PRN Jolaine Artist, MD      . phenazopyridine (PYRIDIUM) tablet 200 mg  200 mg Oral TID WC Jones Bales, MD      . polyethylene glycol (MIRALAX / GLYCOLAX) packet 17 g  17 g Oral Daily Cresenciano Genre, MD   17 g at 08/15/14 1000  . predniSONE (DELTASONE) tablet 5 mg  5 mg Oral QODAY Langley Gauss Moding, MD   5 mg at 08/15/14 1017   And  . predniSONE (DELTASONE) tablet 10 mg  10 mg  Oral QODAY Langley Gauss Moding, MD   10 mg at 08/14/14 1110  . sodium chloride 0.9 % injection 3 mL  3 mL Intravenous Q12H Corky Sox, MD   3 mL at 08/15/14 1000  . sodium chloride 0.9 % injection 3 mL  3 mL Intravenous Q12H Jolaine Artist, MD   3 mL at 08/15/14 1000  . sodium chloride 0.9 % injection 3 mL  3 mL Intravenous PRN Jolaine Artist, MD      . torsemide (DEMADEX) tablet 20 mg  20 mg Oral BID Langley Gauss Moding, MD   20 mg at 08/15/14 1017  . traMADol (ULTRAM) tablet 50 mg  50 mg Oral Q12H PRN Charlesetta Shanks, MD   50 mg at 08/14/14 1352    PE: General appearance: alert, cooperative and no distress HEENT: normal Neck: s/p trach Lungs: CTA Heart: RRR Abd: mild tenderness lower abd; no masses Extremities: no edema Skin: warm and dry Neurologic: Grossly normal  BMET  Recent Labs  08/13/14 0830 08/14/14 0504 08/15/14 0344  NA 140 139 139  K 4.0 3.3* 3.6    CL 94* 93* 92*  CO2 36* 35* 36*  GLUCOSE 233* 151* 220*  BUN 25* 26* 26*  CREATININE 2.02* 1.63* 1.43*  CALCIUM 9.0 8.8 8.8   Assessment/Plan   Active Problems:   Acute on chronic diastolic CHF (congestive heart failure)   Acute kidney failure   Acute pulmonary edema   Pulmonary hypertension   1 right heart failure-difficult situation.  EF 55-60%. She has sarcoidosis, obstructive sleep apnea, and morbid obesity hypoventilation syndrome contributing to her pulmonary hypertension/right heart failure. Would DC on demadex 20 mg daily; bmet one week following DC. Needs fu in office with one of our APPs 2 weeks following DC.  2 pulmonary hypertension- moderate at Rt heart cath 08/11/13 with elevated CVP. Most likely secondary to sarcoidosis, obstructive sleep apnea and obesity hypoventilation syndrome. Tracheostomy is in place. Selective pulmonary vasodilators not recomended.  . 3 acute on chronic renal failure- Improved      4 obesity hypoventilation syndrome-needs weight loss.  5 hypertension- Would not resume BP meds at DC (coreg, norvasc, avapro)  as BP borderline  6 sarcoid-continue steroids.  7. Abdominal pain  Low abd; further eval and management per primary care.  9.  Hypokalemia  supplement   LOS: 6 days    Kirk Ruths MD  08/15/2014 10:24 AM

## 2014-08-15 NOTE — Progress Notes (Signed)
SATURATION QUALIFICATIONS: (This note is used to comply with regulatory documentation for home oxygen)  Patient Saturations on Room Air at Rest = 84%  Patient Saturations on Room Air while Ambulating = **72%  Patient Saturations on 6Liters of oxygen while Ambulating = 93%  Please briefly explain why patient needs home oxygen:Patient is O2 dependant with tracheostomy.

## 2014-08-15 NOTE — Progress Notes (Signed)
08/15/2014 1330 NCM contacted [facility] for high flow oxygen for home. Jonnie Finner RN CCM Case Mgmt phone [phone]

## 2014-08-15 NOTE — Discharge Instructions (Signed)
Please keep your follow-up appointments; this is very important for your continued recovery.    We have made the following additions/changes to your medications:  Please refer to your current medication list.    Please continue to take all of your medications as prescribed.  Do not miss any doses without contacting your primary physician.  If you have questions, please contact your physician or contact the Internal Medicine Teaching Service at [phone].  Please bring your medicications with you to your appointments; medications may be eye drops, herbals, vitamins, or pills.    If you believe you are suffering from a life-threatening emergency, go to the nearest Emergency Department.      Liz [employer]orne Guide  1) Find a Charity fundraiser Although you won't have to find out who is covered by [employer] plan, it is a good idea to ask around and get recommendations. You will then need to call the office and see if the doctor you have chosen will accept you as a new patient and what types of options they offer for patients who are self-pay. Some doctors offer discounts or will set up payment plans for their patients who do not have insurance, but you will need to ask so you aren't surprised when you get to your appointment.  2) Contact Your Local Health Department Not all health departments have doctors that can see patients for sick visits, but many do, so it is worth a call to see if yours does. If you don't know where your local health department is, you can check in your phone book. The CDC also has a tool to help you locate your state's health department, and many state websites also have listings of all of their local health departments.  3) Find a [facility] If your illness is not likely to be very severe or complicated, you may want to try a walk in clinic. These are popping up all over the country in pharmacies, drugstores, and shopping centers. They're usually  staffed by nurse practitioners or physician assistants that have been trained to treat common illnesses and complaints. They're usually fairly quick and inexpensive. However, if you have serious medical issues or chronic medical problems, these are probably not your best option.  No Primary Care Doctor: - Call Health Connect at  [phone] - they can help you locate a primary care doctor that  accepts your insurance, provides certain services, etc. - Physician Referral Service- [phone]  Chronic Pain Problems: Organization         Address  Phone   Notes  [facility]  [phone] Patients need to be referred by their primary care doctor.   Medication Assistance: Organization         Address  Phone   Notes  [facility] Medication [facility] [address] [place], [place] [address] [phone] --Must be a resident of [facility] -- Must have NO insurance coverage whatsoever (no Medicaid/ Medicare, etc.) -- The pt. MUST have a primary care doctor that directs their care regularly and follows them in the community   MedAssist  [phone]   [employer]  [phone]    Agencies that provide inexpensive medical care: Organization         Address  Phone   Notes  [place]  [phone]   [facility]    [phone]   [facility] [phone]  [place], [place] [address] [phone]   [place] [address], [place] [phone]   Planned Parenthood    [phone]   [facility]    [phone]   [place] and [address], [place] Phone:  [phone], Fax:  [phone] Hours of Operation:  9 am - 6 pm, M-F.  Also accepts Medicaid/Medicare and self-pay.  [facility] for [place], [address], [place] Phone: [phone], Fax: [phone]. Hours of  Operation:  8:30 am - 5:30 pm, M-F.  Also accepts Medicaid and self-pay.  [facility] [place] [address], [place] Phone: [phone]   [place], [place], [place] [phone], Ext. [phone] Mondays & Thursdays: 7-9 AM.  First 15 patients are seen on a first come, first serve basis.    [place] Providers:  Organization         Address  Phone   Notes  [facility] [address],  [phone] Also accepts self-pay patients.  [facility] [id number] [place], [place]  [phone]   [place], [address], [place] [phone]   [facility] [address] [place] [phone]   Lucianne Lei [address], [place]   [phone] Only accepts [place] Access [place] patients after they have their name applied to their card.   Self-Pay (no insurance) in [facility]:  Organization         Address  Phone   Notes  Sickle Cell Patients, [facility] Internal Medicine [place] [phone]   [facility] [place] [phone]   [facility] [place]  [place], [place],  [phone]   [facility]/Dr. Osei-Bonsu  [address] [place] or [address], [place] [phone] Phone number for both [place] and [place] locations is the same.  [facility] [address] [place] [phone]   [facility] [address], [place] or [address] [phone]   [facility] [address], [place] [phone], phone; [phone], fax Sees patients 1st and 3rd Saturday of every month.  Must not qualify for public or private insurance (i.e. Medicaid, Medicare,  [place] Health Choice, Veterans' Benefits)  Household income should be no more than 200% of the poverty level The clinic cannot treat you if you are pregnant or think you are pregnant  Sexually transmitted diseases are not treated at the clinic.    Dental Care: Organization         Address  Phone  Notes  [facility] Department of [facility] [phone] Accepts children up to age 58 who are enrolled in [place] or [place]; pregnant women with a Medicaid card; and children who have applied for Medicaid or [place] Health Choice, but were declined, whose parents can pay a reduced fee at time of service.  [facility] Department of [facility]  [address], [place] [phone] Accepts children up to age 61 who are enrolled in [place] or  [place] Health Choice; pregnant women with a Medicaid card; and children who have applied for Medicaid or [place] Health Choice, but were declined, whose parents can pay a reduced fee at time of service.  [place] Adult Dental Access PROGRAM  [place] [phone] Patients are seen by appointment only. Walk-ins are not accepted. [place] will see patients 21 years of age and older. Monday - Tuesday (8am-5pm) Most Wednesdays (8:30-5pm) $30 per visit, cash only  [facility] Adult Dental Access PROGRAM  [address], [facility] [phone] Patients are seen by appointment only. Walk-ins are not accepted. [place] will see patients 11 years of age and older. One Wednesday Evening (Monthly: Volunteer Based).  $30 per visit, cash only  [place]  [phone] for adults; Children under age 20, call Graduate Pediatric Dentistry at [phone]. Children aged 23-14, please call [phone] to request a pediatric application.  Dental services are provided in all areas of dental care including fillings, crowns and bridges,  complete and partial dentures, implants, gum treatment, root canals, and extractions. Preventive care is also provided. Treatment is provided to both adults and children. Patients are selected via a lottery and there is often a waiting list.   [facility] [address], [place]  [phone] [email or web address]   [facility] [address] [place], [place] [phone], Ext. [phone] Second and Fourth Thursday of each month, opens at 6:30 AM; Clinic ends at 9 AM.  Patients are seen on a first-come first-served basis, and a limited number are seen during each clinic.   [facility]  [address] Hillard Danker [place], [place] [phone]   Eligibility Requirements You must have lived in [place], [place], or [place] counties for at least the last three months.   You cannot be eligible for state or federal sponsored [employer], including [employer], [place], or [employer].   You generally cannot be eligible for healthcare insurance through your employer.    How to apply: Eligibility screenings are held every Tuesday and Wednesday afternoon from 1:00 pm until 4:00 pm. You do not need an appointment for the interview!  [facility] [address] [place], [place]   [place]  [place] Department  [place]  [phone]    Behavioral Health Resources in the Community: Intensive Outpatient Programs Organization         Address  Phone  Notes  [place]. [address] [place], [place] [phone]   [facility] [address] [place], [place]   ADS: Alcohol & Drug Svcs [address], [place], [place]   [place] [address],  [place], [place] or [phone]   Substance Abuse Resources Organization          Address  Phone  Notes  Alcohol and Drug Services  [phone]   [place]  [phone]   [facility] [place]   Chinita Pester  [phone]   Residential & Outpatient Substance Abuse Program  [phone]   Psychological Services Organization         Address  Phone  Notes  [facility] [place]  [place]  [phone]   [place] [address] [place] or [phone]  Mobile Crisis Teams Organization         Address  Phone  Notes  [facility], Mobile Crisis Care Unit  [phone]   Assertive Psychotherapeutic Services  [address] [place], [place]   [place] [address] [place] [phone]    Self-Help/Support Groups Organization         Address  Phone             Notes  [place]. of [place] - variety of support groups  [place] Call for more information  Narcotics Anonymous (NA), [facility] [address], [employer] [place]  2 meetings at this location   Special educational needs teacher         Address  Phone  Notes  ASAP Residential Treatment [place],    [place]  [phone]   [facility]  [address], [place] [id number], [place], [place]   [place], [place] [phone] Admissions: 8am-3pm M-F  Incentives Substance [place] [address],    [place], [place] [phone]   [facility] [address] [place], [place], [place]   [facility] [address]  [place], [place]   [facility]ight Programs - Intensive Outpatient [address] Kristeen [address], [place], [place]   [facility] [address].,  [place], [place] [phone] or [phone]   [facility]idential Treatment Services (RTS) [address] [place], [place] Accepts Medicaid  [facility] [place] [address],  [place] [phone] Substance Abuse/Addiction Treatment   [facility] Organization         Address  Phone  Notes  [facility]  [phone]   Domenic Schwab, PhD [address] Arlis Porta [place], [place]   [phone] or [phone]   Sarles [place], [place] [phone]   [facility] [address], [place], [place] [phone] Insurance/Medicaid/sponsorship through [facility] and Families [address]                                    [place], [place] [phone] [place] [address], [place] [phone]    Dr. Adele Schilder  [phone]   [facility] Dept. 1) [address], [place] 2) [place] 3)  [place] [address], [place] [phone]  [phone]   [place] [phone] or [phone] (After Hours)

## 2014-08-15 NOTE — Progress Notes (Signed)
CARE MANAGEMENT NOTE 08/15/2014  Patient:  Melody Gray, Melody Gray   Account Number:  [id number]  Date Initiated:  08/11/2014  Documentation initiated by:  AMERSON,JULIE  Subjective/Objective Assessment:   Pt adm on 08/08/14 with CHF exacerbation.  PTA, pt independent, lives with daughter.     Action/Plan:   Will follow for dc needs as pt progresses.   Anticipated DC Date:  08/16/2014   Anticipated DC Plan:  [place]  CM consult      [facility] Choice  HOME HEALTH   Choice offered to / List presented to:  C-1 Patient   DME arranged  3-N-1  TUB BENCH      DME agency  [place] arranged  HH-1 RN  [place] AIDE  HH-7 RESPIRATORY THERAPY      [place].   Status of service:  Completed, signed off Medicare Important Message given?  YES (If response is "NO", the following Medicare IM given date fields will be blank) Date Medicare IM given:  08/11/2014 Medicare IM given by:  AMERSON,JULIE Date Additional Medicare IM given:  08/13/2014 Additional Medicare IM given by:  Ellan Lambert  Discharge Disposition:  [place]  Per UR Regulation:  Reviewed for med. necessity/level of care/duration of stay  If discussed at [place] of Stay Meetings, dates discussed:    Comments:  08/15/2014 1000 NCM spoke to pt and offered choice for [facility]. Provided [facility] list. Pt requested [facility] for [place]. Requesting 3n1 and tub bench for home. Contacted [facility] for [facility] and DME for home. Jonnie Finner RN CCM Case Mgmt phone [phone]  08/14/14 Ellan Lambert, RN, BSN [phone] Pt for likely dc over the weekend.  Would recommend HHRN, PT, OT and aide.  Also need tub transfer bench.  MD, please leave orders and complete face to face documentation.

## 2014-08-15 NOTE — Progress Notes (Signed)
SATURATION QUALIFICATIONS: (This note is used to comply with regulatory documentation for home oxygen)  Patient Saturations on Room Air at Rest = n/a, At rest on 6 LIT O2 via N/C 100% HR 99  Patient Saturations on Room Air while Ambulating = n/a%  Patient Saturations on 6 Liters of oxygen while Ambulating = 93% via N/C, HR 133  Please briefly explain why patient needs home oxygen: Patient is O2 dependant ,has permanent tracheostomy

## 2014-08-15 NOTE — Progress Notes (Signed)
Subjective:    VSS. UOP 1.3L yesterday, down 4.2L overall.    Still c/o abdominal pain.  States had a small BM yesterday.  Also c/o dysuria.     Objective:    Vital Signs:   Temp:  [98.1 F (36.7 C)-98.7 F (37.1 C)] 98.3 F (36.8 C) (04/16 0558) Pulse Rate:  [93-115] 93 (04/16 0558) Resp:  [16-20] 18 (04/16 0558) BP: (102-135)/(61-88) 102/61 mmHg (04/16 0558) SpO2:  [96 %-100 %] 99 % (04/16 0750) FiO2 (%):  [28 %] 28 % (04/16 0750) Weight:  [113.762 kg (250 lb 12.8 oz)] 113.762 kg (250 lb 12.8 oz) (04/16 0558) Last BM Date: 08/14/14  24-hour weight change: Weight change: -0.272 kg (-9.6 oz)  Intake/Output:   Intake/Output Summary (Last 24 hours) at 08/15/14 1104 Last data filed at 08/15/14 0857  Gross per 24 hour  Intake    880 ml  Output   1252 ml  Net   -372 ml      Physical Exam: General: Trach collar in place, alert and oriented, in mild pain.  Lungs:  Normal work of breathing, mild rhonchi bilaterally.   Heart: RRR. S1 and S2 normal without gallop, murmur, or rubs.  Abdomen:  BS normoactive. Soft, Nondistended, mild tenderness.  Extremities: Trace edema bilaterally, stable.     Labs:  Basic Metabolic Panel:  Recent Labs Lab 08/09/14 1600  08/11/14 0510 08/12/14 0358 08/13/14 0830 08/14/14 0504 08/15/14 0344  NA  --   < > 142 144 140 139 139  K  --   < > 4.9 4.7 4.0 3.3* 3.6  CL  --   < > 101 101 94* 93* 92*  CO2  --   < > 34* 35* 36* 35* 36*  GLUCOSE  --   < > 210* 287* 233* 151* 220*  BUN  --   < > 51* 28* 25* 26* 26*  CREATININE  --   < > 2.27* 1.34* 2.02* 1.63* 1.43*  CALCIUM  --   < > 8.3* 9.1 9.0 8.8 8.8  MG 1.4*  --   --   --   --   --   --   < > = values in this interval not displayed.  Liver Function Tests:  Recent Labs Lab 08/08/14 2323  AST 18  ALT 15  ALKPHOS 87  BILITOT 0.9  PROT 7.1  ALBUMIN 3.3*   CBC:  Recent Labs Lab 08/08/14 2323 08/09/14 0540 08/10/14 0405 08/11/14 0510 08/12/14 0358  WBC 10.4 10.7*  9.8 9.0 5.9  NEUTROABS 7.1  --   --   --   --   HGB 11.5* 10.9* 10.3* 10.9* 10.7*  HCT 38.2 37.0 35.5* 37.9 36.1  MCV 97.2 98.9 100.3* 100.8* 101.4*  PLT 268 256 251 229 217    Cardiac Enzymes:  Recent Labs Lab 08/08/14 2323  TROPONINI <0.03   CBG:  Recent Labs Lab 08/13/14 2058 08/14/14 0638 08/14/14 1119 08/14/14 1614 08/14/14 2139  GLUCAP 144* 144* 160* 260* 195*   ABG    Component Value Date/Time   PHART 7.382 08/12/2014 1145   PCO2ART 62.6* 08/12/2014 1145   PO2ART 61.5* 08/12/2014 1145   HCO3 38.5* 08/12/2014 1746   TCO2 41 08/12/2014 1746   O2SAT 65.0 08/12/2014 1746   Microbiology: Results for orders placed or performed during the hospital encounter of 08/08/14  Culture, Urine     Status: None   Collection Time: 08/09/14 12:55 PM  Result Value Ref Range Status  Specimen Description URINE, RANDOM  Final   Special Requests NONE  Final   Colony Count   Final    >=100,000 COLONIES/ML Performed at [facility]    Culture   Final    ESCHERICHIA COLI Performed at [facility]    Report Status 08/11/2014 FINAL  Final   Organism ID, Bacteria ESCHERICHIA COLI  Final      Susceptibility   Escherichia coli - MIC*    AMPICILLIN <=2 SENSITIVE Sensitive     CEFAZOLIN <=4 SENSITIVE Sensitive     CEFTRIAXONE <=1 SENSITIVE Sensitive     CIPROFLOXACIN <=0.25 SENSITIVE Sensitive     GENTAMICIN <=1 SENSITIVE Sensitive     LEVOFLOXACIN <=0.12 SENSITIVE Sensitive     NITROFURANTOIN <=16 SENSITIVE Sensitive     TOBRAMYCIN <=1 SENSITIVE Sensitive     TRIMETH/SULFA <=20 SENSITIVE Sensitive     PIP/TAZO <=4 SENSITIVE Sensitive     * ESCHERICHIA COLI   Imaging: No results found.     Medications:    Infusions:    Scheduled Medications: . atorvastatin  20 mg Oral q1800  . budesonide (PULMICORT) nebulizer solution  0.25 mg Nebulization BID  . cephALEXin  500 mg Oral Q12H  . docusate sodium  100 mg Oral BID  . famotidine  20 mg Oral Daily  .  ferrous sulfate  325 mg Oral Q breakfast  . heparin  5,000 Units Subcutaneous 3 times per day  . insulin aspart  0-20 Units Subcutaneous TID WC  . insulin aspart  0-5 Units Subcutaneous QHS  . insulin aspart  4 Units Subcutaneous TID WC  . insulin detemir  40 Units Subcutaneous QHS  . ipratropium-albuterol  3 mL Nebulization Q6H  . phenazopyridine  200 mg Oral TID WC  . polyethylene glycol  17 g Oral Daily  . potassium chloride  40 mEq Oral Once  . predniSONE  5 mg Oral QODAY   And  . predniSONE  10 mg Oral QODAY  . sodium chloride  3 mL Intravenous Q12H  . sodium chloride  3 mL Intravenous Q12H  . 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 SCr trending down back to baseline.  -Appreciate cardiology recommendations. -Torsemide 20 mg twice a day. -Prednisone 5 mg and 10 mg alternating per home regimen. -K-Dur 40 mEq once for hypokalemia due to diuresis. -Continue strict ins and outs. -Daily weights. -Card modified/heart healthy diet. -Supplemental oxygen with goal saturation between 90 and 92%.  Acute on chronic kidney disease UOP 1.3L yesterday, SCr back to baseline.   -cont torsemide  Abdominal pain Improved today, likely d/t UTI? -will try pyridium today  -Continue MiraLAX daily. -Continue Dulcolax suppository when necessary. -Tylenol 650 mg every 6 hours as needed. -Morphine 1 mg every 4 hours as needed.  Urinary tract infection -Switched to cephalexin by mouth yesterday, day 6 of 7.  Obesity hypoventilation syndrome -Continue oxygen supplement vision via trach collar as needed. Okay to transition to nasal cannula to see if sats remain OK.  -Continue DuoNeb's every 6  hours. -Continue Pulmicort nebulization twice a day.  Hypertension Blood pressure normotensive off of home blood pressure medications.  -Continue to hold home Coreg 12.5  mg twice a day, amlodipine 5 mg daily, and irbesartan 150 mg daily. Marland Kitchen  DMII Blood sugars much improved with mealtime insulin. -Continue Levemir 40 units daily at bedtime. -Continue NovoLog 4 units 3 times a day with meals. -Continue sliding scale insulin resistant.  Length of Stay: 6 day(s)  Likely d/c today with HH.     Signed: Jones Bales, MD  PGY-2, Internal Medicine Resident 08/15/2014, 11:04 AM

## 2014-08-16 LAB — BASIC METABOLIC PANEL
Anion gap: 11 (ref 5–15)
BUN: 23 mg/dL (ref 6–23)
CO2: 38 mmol/L — ABNORMAL HIGH (ref 19–32)
Calcium: 8.7 mg/dL (ref 8.4–10.5)
Chloride: 91 mmol/L — ABNORMAL LOW (ref 96–112)
Creatinine, Ser: 1.31 mg/dL — ABNORMAL HIGH (ref 0.50–1.10)
GFR calc Af Amer: 56 mL/min — ABNORMAL LOW (ref >90)
GFR calc non Af Amer: 48 mL/min — ABNORMAL LOW (ref >90)
Glucose, Bld: 184 mg/dL — ABNORMAL HIGH (ref 70–99)
Potassium: 3.4 mmol/L — ABNORMAL LOW (ref 3.5–5.1)
Sodium: 140 mmol/L (ref 135–145)

## 2014-08-16 LAB — VITAMIN D 1,25 DIHYDROXY
Vitamin D 1, 25 (OH)2 Total: 39 pg/mL
Vitamin D2 1, 25 (OH)2: <10 pg/mL
Vitamin D3 1, 25 (OH)2: 35 pg/mL

## 2014-08-16 LAB — GLUCOSE, CAPILLARY
Glucose-Capillary: 144 mg/dL — ABNORMAL HIGH (ref 70–99)
Glucose-Capillary: 208 mg/dL — ABNORMAL HIGH (ref 70–99)

## 2014-08-16 MED ORDER — POTASSIUM CHLORIDE CRYS ER 20 MEQ PO TBCR: 20.0000 meq | EXTENDED_RELEASE_TABLET | Freq: Every day | ORAL | Status: DC

## 2014-08-16 MED ORDER — INSULIN DETEMIR 100 UNIT/ML ~~LOC~~ SOLN: 42.0000 [IU] | Freq: Every day | SUBCUTANEOUS | Status: DC

## 2014-08-16 MED ORDER — POTASSIUM CHLORIDE CRYS ER 20 MEQ PO TBCR
40.0000 meq | EXTENDED_RELEASE_TABLET | Freq: Once | ORAL | Status: DC
  Filled 2014-08-16: qty 2

## 2014-08-16 NOTE — Progress Notes (Signed)
Subjective:    VSS. UOP down 3.4L overall.  Abdominal pain improved had BM last PM.  Requesting to go home.      Objective:    Vital Signs:   Temp:  [97.2 F (36.2 C)-98 F (36.7 C)] 98 F (36.7 C) (04/17 0546) Pulse Rate:  [86-114] 86 (04/17 0546) Resp:  [18] 18 (04/17 0546) BP: (111-140)/(66-81) 111/66 mmHg (04/17 0546) SpO2:  [96 %-100 %] 96 % (04/17 0900) FiO2 (%):  [28 %] 28 % (04/17 0900) Weight:  [111.63 kg (246 lb 1.6 oz)] 111.63 kg (246 lb 1.6 oz) (04/17 0546) Last BM Date: 08/15/14  24-hour weight change: Weight change: -2.132 kg (-4 lb 11.2 oz)  Intake/Output:   Intake/Output Summary (Last 24 hours) at 08/16/14 1220 Last data filed at 08/16/14 1136  Gross per 24 hour  Intake   1016 ml  Output    600 ml  Net    416 ml      Physical Exam: General: Trach collar in place, alert and oriented, in mild pain.  Lungs:  Normal work of breathing, mild rhonchi bilaterally.   Heart: RRR. S1 and S2 normal without gallop, murmur, or rubs.  Abdomen:  BS normoactive. Soft, Nondistended, mild tenderness.  Extremities: Trace edema bilaterally, stable.     Labs:  Basic Metabolic Panel:  Recent Labs Lab 08/09/14 1600  08/12/14 0358 08/13/14 0830 08/14/14 0504 08/15/14 0344 08/16/14 0405  NA  --   < > 144 140 139 139 140  K  --   < > 4.7 4.0 3.3* 3.6 3.4*  CL  --   < > 101 94* 93* 92* 91*  CO2  --   < > 35* 36* 35* 36* 38*  GLUCOSE  --   < > 287* 233* 151* 220* 184*  BUN  --   < > 28* 25* 26* 26* 23  CREATININE  --   < > 1.34* 2.02* 1.63* 1.43* 1.31*  CALCIUM  --   < > 9.1 9.0 8.8 8.8 8.7  MG 1.4*  --   --   --   --   --   --   < > = values in this interval not displayed.  Liver Function Tests: No results for input(s): AST, ALT, ALKPHOS, BILITOT, PROT, ALBUMIN in the last 168 hours. CBC:  Recent Labs Lab 08/10/14 0405 08/11/14 0510 08/12/14 0358  WBC 9.8 9.0 5.9  HGB 10.3* 10.9* 10.7*  HCT 35.5* 37.9 36.1  MCV 100.3* 100.8* 101.4*  PLT 251 229  217    Cardiac Enzymes: No results for input(s): CKTOTAL, CKMB, CKMBINDEX, TROPONINI in the last 168 hours. CBG:  Recent Labs Lab 08/15/14 1157 08/15/14 1635 08/15/14 2050 08/16/14 0618 08/16/14 1149  GLUCAP 209* 289* 222* 144* 208*   ABG    Component Value Date/Time   PHART 7.382 08/12/2014 1145   PCO2ART 62.6* 08/12/2014 1145   PO2ART 61.5* 08/12/2014 1145   HCO3 38.5* 08/12/2014 1746   TCO2 41 08/12/2014 1746   O2SAT 65.0 08/12/2014 1746   Microbiology: Results for orders placed or performed during the hospital encounter of 08/08/14  Culture, Urine     Status: None   Collection Time: 08/09/14 12:55 PM  Result Value Ref Range Status   Specimen Description URINE, RANDOM  Final   Special Requests NONE  Final   Colony Count   Final    >=100,000 COLONIES/ML Performed at [facility]   Final  ESCHERICHIA COLI Performed at [facility]    Report Status 08/11/2014 FINAL  Final   Organism ID, Bacteria ESCHERICHIA COLI  Final      Susceptibility   Escherichia coli - MIC*    AMPICILLIN <=2 SENSITIVE Sensitive     CEFAZOLIN <=4 SENSITIVE Sensitive     CEFTRIAXONE <=1 SENSITIVE Sensitive     CIPROFLOXACIN <=0.25 SENSITIVE Sensitive     GENTAMICIN <=1 SENSITIVE Sensitive     LEVOFLOXACIN <=0.12 SENSITIVE Sensitive     NITROFURANTOIN <=16 SENSITIVE Sensitive     TOBRAMYCIN <=1 SENSITIVE Sensitive     TRIMETH/SULFA <=20 SENSITIVE Sensitive     PIP/TAZO <=4 SENSITIVE Sensitive     * ESCHERICHIA COLI   Imaging: No results found.     Medications:    Infusions:    Scheduled Medications: . atorvastatin  20 mg Oral q1800  . budesonide (PULMICORT) nebulizer solution  0.25 mg Nebulization BID  . cephALEXin  500 mg Oral Q12H  . docusate sodium  100 mg Oral BID  . famotidine  20 mg Oral Daily  . ferrous sulfate  325 mg Oral Q breakfast  . heparin  5,000 Units Subcutaneous 3 times per day  . insulin aspart  0-20 Units Subcutaneous TID WC   . insulin aspart  0-5 Units Subcutaneous QHS  . insulin aspart  4 Units Subcutaneous TID WC  . insulin detemir  42 Units Subcutaneous QHS  . ipratropium-albuterol  3 mL Nebulization Q6H  . polyethylene glycol  17 g Oral Daily  . potassium chloride SA  40 mEq Oral Once  . predniSONE  5 mg Oral QODAY   And  . predniSONE  10 mg Oral QODAY  . sodium chloride  3 mL Intravenous Q12H  . sodium chloride  3 mL Intravenous Q12H  . 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   -Appreciate cardiology recommendations. -Torsemide 20 mg once daily.   -Prednisone 5 mg and 10 mg alternating per home regimen. -K-Dur 40 mEq once for hypokalemia due to diuresis. -Cont Kdur 74meq daily  -Continue strict ins and outs. -Daily weights. -Card modified/heart healthy diet. -Supplemental oxygen with goal saturation between 90 and 92%.  Acute on chronic kidney disease SCr back to baseline.   -cont torsemide  Abdominal pain Improved today, likely d/t UTI?  States pyridium and a BM helped with pain.  -will try pyridium today  -Continue MiraLAX daily. -Continue Dulcolax suppository when necessary. -Tylenol 650 mg every 6 hours as needed.  Urinary tract infection -Completed keflex course.   Obesity hypoventilation syndrome -Continue oxygen supplement vision via trach collar as needed. Okay to transition to nasal cannula at 6L.    -Continue DuoNeb's every 6 hours. -Continue Pulmicort nebulization twice a day.  Hypertension Blood pressure normotensive off of home blood pressure medications.  -Continue to hold home Coreg 12.5 mg twice a day, amlodipine 5 mg daily, and irbesartan 150 mg daily.  DMII Blood sugars much improved  with mealtime insulin. -Continue Levemir 42 units qhs -Continue NovoLog 4 units tid with meals  -Continue sliding scale insulin resistant.  Length of Stay: 7 day(s)  Likely d/c today with HH.     Signed: Toma Deiters  Gordy Levan, MD  PGY-2, Internal Medicine Resident 08/16/2014, 12:20 PM

## 2014-08-16 NOTE — Progress Notes (Signed)
Subjective: Denies dyspnea or chest pain; Abd pain better  Objective: Vital signs in last 24 hours: Temp:  [97.2 F (36.2 C)-98 F (36.7 C)] 98 F (36.7 C) (04/17 0546) Pulse Rate:  [86-114] 86 (04/17 0546) Resp:  [18] 18 (04/17 0546) BP: (111-140)/(66-81) 111/66 mmHg (04/17 0546) SpO2:  [98 %-100 %] 98 % (04/17 0546) FiO2 (%):  [28 %] 28 % (04/17 0546) Weight:  [246 lb 1.6 oz (111.63 kg)] 246 lb 1.6 oz (111.63 kg) (04/17 0546) Last BM Date: 08/15/14  Intake/Output from previous day: 04/16 0701 - 04/17 0700 In: 1136 [P.O.:1136] Out: 600 [Urine:600] Intake/Output this shift:    Medications Current Facility-Administered Medications  Medication Dose Route Frequency Provider Last Rate Last Dose  . 0.9 %  sodium chloride infusion  250 mL Intravenous PRN Jolaine Artist, MD      . acetaminophen (TYLENOL) tablet 650 mg  650 mg Oral Q6H PRN Charlesetta Shanks, MD   650 mg at 08/14/14 2011  . atorvastatin (LIPITOR) tablet 20 mg  20 mg Oral q1800 Corky Sox, MD   20 mg at 08/15/14 1800  . bisacodyl (DULCOLAX) suppository 10 mg  10 mg Rectal Daily PRN Langley Gauss Moding, MD   10 mg at 08/13/14 1511  . budesonide (PULMICORT) nebulizer solution 0.25 mg  0.25 mg Nebulization BID Jones Bales, MD   0.25 mg at 08/15/14 2058  . cephALEXin (KEFLEX) capsule 500 mg  500 mg Oral Q12H Langley Gauss Moding, MD   500 mg at 08/15/14 2319  . docusate sodium (COLACE) capsule 100 mg  100 mg Oral BID Jones Bales, MD   100 mg at 08/15/14 1019  . famotidine (PEPCID) tablet 20 mg  20 mg Oral Daily Jones Bales, MD   20 mg at 08/15/14 1018  . ferrous sulfate tablet 325 mg  325 mg Oral Q breakfast Nischal Narendra, MD   325 mg at 08/15/14 1016  . heparin injection 5,000 Units  5,000 Units Subcutaneous 3 times per day Jolaine Artist, MD   5,000 Units at 08/16/14 0723  . insulin aspart (novoLOG) injection 0-20 Units  0-20 Units Subcutaneous TID WC Jones Bales, MD   3 Units at 08/16/14 0724    . insulin aspart (novoLOG) injection 0-5 Units  0-5 Units Subcutaneous QHS Jones Bales, MD   2 Units at 08/15/14 2325  . insulin aspart (novoLOG) injection 4 Units  4 Units Subcutaneous TID WC Charlesetta Shanks, MD   4 Units at 08/15/14 1630  . insulin detemir (LEVEMIR) injection 42 Units  42 Units Subcutaneous QHS Jones Bales, MD   42 Units at 08/15/14 2336  . ipratropium-albuterol (DUONEB) 0.5-2.5 (3) MG/3ML nebulizer solution 3 mL  3 mL Nebulization Q6H Jones Bales, MD   3 mL at 08/16/14 0252  . ondansetron (ZOFRAN) injection 4 mg  4 mg Intravenous Q6H PRN Jolaine Artist, MD      . polyethylene glycol (MIRALAX / GLYCOLAX) packet 17 g  17 g Oral Daily Cresenciano Genre, MD   17 g at 08/15/14 1000  . potassium chloride SA (K-DUR,KLOR-CON) CR tablet 40 mEq  40 mEq Oral Once Jones Bales, MD      . predniSONE (DELTASONE) tablet 5 mg  5 mg Oral QODAY Langley Gauss Moding, MD   5 mg at 08/15/14 1017   And  . predniSONE (DELTASONE) tablet 10 mg  10 mg Oral QODAY Charlesetta Shanks, MD  10 mg at 08/14/14 1110  . sodium chloride 0.9 % injection 3 mL  3 mL Intravenous Q12H Corky Sox, MD   3 mL at 08/15/14 2321  . sodium chloride 0.9 % injection 3 mL  3 mL Intravenous Q12H Jolaine Artist, MD   3 mL at 08/15/14 2321  . sodium chloride 0.9 % injection 3 mL  3 mL Intravenous PRN Jolaine Artist, MD      . torsemide [facility]) tablet 20 mg  20 mg Oral Daily Lelon Perla, MD      . traMADol Veatrice Bourbon) tablet 50 mg  50 mg Oral Q12H PRN Charlesetta Shanks, MD   50 mg at 08/15/14 1604    PE: General appearance: alert, cooperative and no distress HEENT: normal Neck: s/p trach Lungs: CTA Heart: RRR Abd: soft Extremities: trace to 1+ edema Skin: warm and dry Neurologic: Grossly normal  BMET  Recent Labs  08/14/14 0504 08/15/14 0344 08/16/14 0405  NA 139 139 140  K 3.3* 3.6 3.4*  CL 93* 92* 91*  CO2 35* 36* 38*  GLUCOSE 151* 220* 184*  BUN 26* 26* 23  CREATININE 1.63*  1.43* 1.31*  CALCIUM 8.8 8.8 8.7   Assessment/Plan   Active Problems:   Acute on chronic diastolic CHF (congestive heart failure)   Acute kidney failure   Acute pulmonary edema   Pulmonary hypertension   1 right heart failure-difficult situation.  EF 55-60%. She has sarcoidosis, obstructive sleep apnea, and morbid obesity hypoventilation syndrome contributing to her pulmonary hypertension/right heart failure. Would DC on demadex 20 mg daily; take additional 20 daily for weight gain of 2-3 lbs; bmet one week following DC. Needs fu in office with one of our APPs 2 weeks following DC.  2 pulmonary hypertension- moderate at Rt heart cath 08/11/13 with elevated CVP. Most likely secondary to sarcoidosis, obstructive sleep apnea and obesity hypoventilation syndrome. Tracheostomy is in place. Selective pulmonary vasodilators not recomended.  . 3 acute on chronic renal failure- Improved      4 obesity hypoventilation syndrome-needs weight loss.  5 hypertension- Would not resume BP meds at DC (coreg, norvasc, avapro)  as BP borderline  6 sarcoid-continue steroids.  7. Abdominal pain  Improved  9.  Hypokalemia  supplement   LOS: 7 days    Kirk Ruths MD  08/16/2014 8:29 AM

## 2014-08-17 ENCOUNTER — Other Ambulatory Visit: Payer: Self-pay

## 2014-08-17 DIAGNOSIS — E662 Morbid (severe) obesity with alveolar hypoventilation: Secondary | ICD-10-CM | POA: Diagnosis not present

## 2014-08-17 DIAGNOSIS — D860 Sarcoidosis of lung: Secondary | ICD-10-CM | POA: Diagnosis not present

## 2014-08-17 DIAGNOSIS — E1165 Type 2 diabetes mellitus with hyperglycemia: Secondary | ICD-10-CM | POA: Diagnosis not present

## 2014-08-17 DIAGNOSIS — Z430 Encounter for attention to tracheostomy: Secondary | ICD-10-CM | POA: Diagnosis not present

## 2014-08-17 DIAGNOSIS — I272 Other secondary pulmonary hypertension: Secondary | ICD-10-CM | POA: Diagnosis not present

## 2014-08-17 DIAGNOSIS — E11319 Type 2 diabetes mellitus with unspecified diabetic retinopathy without macular edema: Secondary | ICD-10-CM | POA: Diagnosis not present

## 2014-08-17 DIAGNOSIS — Z9981 Dependence on supplemental oxygen: Secondary | ICD-10-CM | POA: Diagnosis not present

## 2014-08-17 DIAGNOSIS — I5033 Acute on chronic diastolic (congestive) heart failure: Secondary | ICD-10-CM | POA: Diagnosis not present

## 2014-08-17 DIAGNOSIS — Z794 Long term (current) use of insulin: Secondary | ICD-10-CM | POA: Diagnosis not present

## 2014-08-17 DIAGNOSIS — Z6841 Body Mass Index (BMI) 40.0 and over, adult: Secondary | ICD-10-CM | POA: Diagnosis not present

## 2014-08-17 DIAGNOSIS — Z720 Tobacco use: Secondary | ICD-10-CM | POA: Diagnosis not present

## 2014-08-17 NOTE — Patient Outreach (Signed)
Unable to complete today's transition of care call because patient states her Nurse from [place] came in.  This call was rescheduled for tomorrow, April 19, in the afternoon.

## 2014-08-18 ENCOUNTER — Other Ambulatory Visit: Payer: Self-pay

## 2014-08-18 DIAGNOSIS — E1165 Type 2 diabetes mellitus with hyperglycemia: Secondary | ICD-10-CM | POA: Diagnosis not present

## 2014-08-18 DIAGNOSIS — D860 Sarcoidosis of lung: Secondary | ICD-10-CM | POA: Diagnosis not present

## 2014-08-18 DIAGNOSIS — I272 Other secondary pulmonary hypertension: Secondary | ICD-10-CM | POA: Diagnosis not present

## 2014-08-18 DIAGNOSIS — Z430 Encounter for attention to tracheostomy: Secondary | ICD-10-CM | POA: Diagnosis not present

## 2014-08-18 DIAGNOSIS — E11319 Type 2 diabetes mellitus with unspecified diabetic retinopathy without macular edema: Secondary | ICD-10-CM | POA: Diagnosis not present

## 2014-08-18 DIAGNOSIS — I5033 Acute on chronic diastolic (congestive) heart failure: Secondary | ICD-10-CM | POA: Diagnosis not present

## 2014-08-18 NOTE — Patient Instructions (Signed)
Patient to attend post hospital appointment with primary care physician.

## 2014-08-19 ENCOUNTER — Ambulatory Visit (INDEPENDENT_AMBULATORY_CARE_PROVIDER_SITE_OTHER): Payer: Medicare Other | Admitting: Internal Medicine

## 2014-08-19 ENCOUNTER — Encounter: Payer: Self-pay | Admitting: Internal Medicine

## 2014-08-19 ENCOUNTER — Ambulatory Visit: Payer: Self-pay

## 2014-08-19 VITALS — BP 136/69 | HR 109 | Temp 98.1°F | Ht 61.0 in | Wt 245.6 lb

## 2014-08-19 DIAGNOSIS — E1165 Type 2 diabetes mellitus with hyperglycemia: Secondary | ICD-10-CM

## 2014-08-19 DIAGNOSIS — Z430 Encounter for attention to tracheostomy: Secondary | ICD-10-CM | POA: Diagnosis not present

## 2014-08-19 DIAGNOSIS — I270 Primary pulmonary hypertension: Secondary | ICD-10-CM

## 2014-08-19 DIAGNOSIS — E11319 Type 2 diabetes mellitus with unspecified diabetic retinopathy without macular edema: Secondary | ICD-10-CM | POA: Diagnosis not present

## 2014-08-19 DIAGNOSIS — I272 Other secondary pulmonary hypertension: Secondary | ICD-10-CM | POA: Diagnosis not present

## 2014-08-19 DIAGNOSIS — I10 Essential (primary) hypertension: Secondary | ICD-10-CM

## 2014-08-19 DIAGNOSIS — D860 Sarcoidosis of lung: Secondary | ICD-10-CM | POA: Diagnosis not present

## 2014-08-19 DIAGNOSIS — IMO0002 Reserved for concepts with insufficient information to code with codable children: Secondary | ICD-10-CM

## 2014-08-19 DIAGNOSIS — R300 Dysuria: Secondary | ICD-10-CM | POA: Diagnosis not present

## 2014-08-19 DIAGNOSIS — I5033 Acute on chronic diastolic (congestive) heart failure: Secondary | ICD-10-CM | POA: Diagnosis not present

## 2014-08-19 LAB — BASIC METABOLIC PANEL WITH GFR
BUN: 15 mg/dL (ref 6–23)
CO2: 29 mEq/L (ref 19–32)
Calcium: 9 mg/dL (ref 8.4–10.5)
Chloride: 93 mEq/L — ABNORMAL LOW (ref 96–112)
Creat: 1.34 mg/dL — ABNORMAL HIGH (ref 0.50–1.10)
GFR, Est African American: 55 mL/min — ABNORMAL LOW
GFR, Est Non African American: 48 mL/min — ABNORMAL LOW
Glucose, Bld: 323 mg/dL — ABNORMAL HIGH (ref 70–99)
Potassium: 4.8 mEq/L (ref 3.5–5.3)
Sodium: 138 mEq/L (ref 135–145)

## 2014-08-19 MED ORDER — INSULIN DETEMIR 100 UNIT/ML ~~LOC~~ SOLN: 46.0000 [IU] | Freq: Every day | SUBCUTANEOUS | Status: DC

## 2014-08-19 NOTE — Patient Instructions (Signed)
General Instructions: Please increase Levemir insulin to 46 units  Please continue with your medications like before  We will check you labs today  Be sure to follow up with Dr Stanford Breed Please come back in 1 month   Please bring your medicines with you each time you come to clinic.  Medicines may include prescription medications, over-the-counter medications, herbal remedies, eye drops, vitamins, or other pills.   Progress Toward Treatment Goals:  Treatment Goal 08/19/2014  Hemoglobin A1C unchanged  Blood pressure at goal  Stop smoking -    Self Care Goals & Plans:  Self Care Goal 08/19/2014  Manage my medications take my medicines as prescribed; bring my medications to every visit; refill my medications on time; follow the sick day instructions if I am sick  Monitor my health keep track of my blood glucose; keep track of my blood pressure; keep track of my weight; check my feet daily  Eat healthy foods eat more vegetables; eat fruit for snacks and desserts; eat baked foods instead of fried foods; eat foods that are low in salt; eat smaller portions; drink diet soda or water instead of juice or soda  Be physically active find an activity I enjoy  Stop smoking -    Home Blood Glucose Monitoring 08/19/2014  Check my blood sugar 3 times a day  When to check my blood sugar before breakfast; before lunch     Care Management & Community Referrals:  Referral 08/19/2014  Referrals made for care management support none needed  Referrals made to community resources -

## 2014-08-20 DIAGNOSIS — I5033 Acute on chronic diastolic (congestive) heart failure: Secondary | ICD-10-CM | POA: Diagnosis not present

## 2014-08-20 DIAGNOSIS — Z430 Encounter for attention to tracheostomy: Secondary | ICD-10-CM | POA: Diagnosis not present

## 2014-08-20 DIAGNOSIS — E1165 Type 2 diabetes mellitus with hyperglycemia: Secondary | ICD-10-CM | POA: Diagnosis not present

## 2014-08-20 DIAGNOSIS — I272 Other secondary pulmonary hypertension: Secondary | ICD-10-CM | POA: Diagnosis not present

## 2014-08-20 DIAGNOSIS — R300 Dysuria: Secondary | ICD-10-CM | POA: Insufficient documentation

## 2014-08-20 DIAGNOSIS — E11319 Type 2 diabetes mellitus with unspecified diabetic retinopathy without macular edema: Secondary | ICD-10-CM | POA: Diagnosis not present

## 2014-08-20 DIAGNOSIS — D860 Sarcoidosis of lung: Secondary | ICD-10-CM | POA: Diagnosis not present

## 2014-08-20 LAB — URINALYSIS, COMPLETE
Bacteria, UA: NONE SEEN
Bilirubin Urine: NEGATIVE
Casts: NONE SEEN
Crystals: NONE SEEN
Glucose, UA: 250 mg/dL — AB
Hgb urine dipstick: NEGATIVE
Ketones, ur: NEGATIVE mg/dL
Leukocytes, UA: NEGATIVE
Nitrite: NEGATIVE
Protein, ur: 30 mg/dL — AB
Specific Gravity, Urine: 1.012 (ref 1.005–1.030)
Urobilinogen, UA: 0.2 mg/dL (ref 0.0–1.0)
pH: 5.5 (ref 5.0–8.0)

## 2014-08-20 NOTE — Assessment & Plan Note (Signed)
BP Readings from Last 3 Encounters:  08/19/14 136/69  08/16/14 111/66  08/07/14 138/75    Lab Results  Component Value Date   NA 138 08/19/2014   K 4.8 08/19/2014   CREATININE 1.34* 08/19/2014    Assessment: Blood pressure control: controlled Progress toward BP goal:  at goal Comments: On torsemide 20 mg daily  Plan: Medications:  continue current medications Educational resources provided: brochure, handout, video Self management tools provided:   Other plans: routine follow up

## 2014-08-20 NOTE — Assessment & Plan Note (Signed)
She describes some dysuria at the end of her urine stream. No other urinary symptoms. Recently completed treatment for urinary tract infection. UA checked today is normal. Her terminal dysuria might be related to her current diuretic therapy. She says that she takes cranberry juice which I encouraged her to continue.

## 2014-08-20 NOTE — Assessment & Plan Note (Addendum)
Lab Results  Component Value Date   HGBA1C 9.7 07/28/2014   HGBA1C 8.6* 05/06/2014   HGBA1C 8.5 02/19/2014    Assessment: Diabetes control: poor control (HgbA1C >9%) Progress toward A1C goal:  unchanged Comments: reports cbg 200-300s at home, did not bring her meter  Plan: Medications:  Increase levemir 48 units at PM and novolog per sliding scale.  Home glucose monitoring: Frequency: 3 times a day Timing: before breakfast, before lunch Instruction/counseling given: reminded to get eye exam, reminded to bring blood glucose meter & log to each visit, reminded to bring medications to each visit, discussed the need for weight loss and discussed diet Educational resources provided: brochure, handout Self management tools provided:   Other plans: will follow with PCP in a few weeks to make further changes to her medications.

## 2014-08-20 NOTE — Assessment & Plan Note (Addendum)
Symptoms are stable since returning home. Wt stable at 245lb. Exam does not reveal fluid overload. Serum creatinine 1.34 from 1.31 3 days ago. Potassium is 4.8. She continues to be on potassium supplementation. BUN 15. Plan -Continue with Torsemide 20 mg daily -Renal function and electrolytes checked and stable. -Follow-up with PCP in 2-3 months. -She has follow-up with pulmonology as well -Continue with potassium supplementation while on Torsemide

## 2014-08-20 NOTE — Progress Notes (Signed)
INTERNAL MEDICINE TEACHING ATTENDING ADDENDUM - Jalisa Sacco, MD: I reviewed and discussed at the time of visit with the resident Dr. Kazibwe, the patient's medical history, physical examination, diagnosis and results of pertinent tests and treatment and I agree with the patient's care as documented.  

## 2014-08-20 NOTE — Progress Notes (Signed)
Patient ID: Melody Gray, female   DOB: [birth year], 44 y.o.   MRN: [id number]   Subjective:   HPI: [name] is a 44 y.o. woman with past medical history listed below, presents for hospital follow-up visit.  She was discharged from the hospital on 08/15/2014 after one week of hospitalization for acute on chronic diastolic CHF, acute renal failure, acute renal failure and a new diagnosis of right heart failure secondary to pulmonary hypertension. This is all in the setting of a history of obstructive sleep apnea, obesity hypoventilation syndrome and history of sarcoidosis. During her hospital stay, she was evaluated by cardiology in consultation who performed right heart catheterization and this revealed pulmonary hypertension. Patient is chronically on home O2 via a chronic tracheostomy. She was discharged on torsemide 20 mg daily, to which she has been compliant. She also continues to take chronic steroids for treatment of sarcoidosis. She has felt well since returning home. She continues to use oxygen. Her weight has remained stable at around 245 pounds.  She complains of termimal no dysuria, which has been present for the last several days. She also has some mild lower abdominal pain, but she denies fevers, chills or any other constitutional symptoms. She denies hematuria or increased urinary frequency. Of note, she just recently completed a treatment for a UTI with Keflex. Urine cultures 10 days ago revealed pansensitive Escherichia coli.   For her other chronic medical problems, please see my assessment and plan.    Past Medical History  Diagnosis Date  . Sarcoidosis     Followed by Dr. Melvyn Novas; w/ liver involvement per biopsy 12/09, Reversible airway component so started on [facility] 01/2010; HFA 75% p coaching 05/2010  . Hypoxemia     CT angiogram 9/11>> No PE; PFTs 10/11- FEV1 1.20 (49%) with 16% better p B2, DLCO 33%> corrects to 84; O2 sats ok on 4 lpm X rapid walk X 3 laps 05/2010    . Morbid obesity     Target wt= 153 for BMI <30  . QT prolongation   . Hypertension   . Hx of cardiac cath 2/08    No CAD, no RAS,  normal EF  . Seborrheic dermatitis of scalp   . 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 Hep B serology neg. but Hep C ab +, HIV neg. 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 Cardiomyopathy, nonischemic     EF 45% 12/10; Echo 7/11 normal EF, PAS 48  . Diabetic retinopathy     Right eye 2/11  . Health maintenance examination     Mammogram 05/2010 Negative; Last Pap smear 03/2008; Last DM eye exam 2/11> mild non-proliferative diabetic retinopathy. OD  . Helicobacter pylori ab+ 05/2011    Pt was symptomatic and treatment planned for 05/2011  . CHF (congestive heart failure)   . Complication of anesthesia     " difficult waking "  . Sleep apnea   . Diabetes mellitus     insulin dependent  . Acute bronchitis 07/29/2014    ROS: Constitutional: Denies fever, chills, diaphoresis, appetite change and fatigue.  Respiratory:.Baseline SOB, DOE, cough, chest tightness, and wheezing. Denies chest pain. CVS: No chest pain, palpitations and leg swelling.  GI: No abdominal pain, nausea, vomiting, bloody stools MSK: No myalgias, back pain, joint swelling, arthralgias  Psych: No depression symptoms. No SI or SA.    Objective:  Physical Exam: Filed Vitals:   08/19/14 1520  BP: 136/69  Pulse: 109  Temp: 98.1 F (36.7 C)  TempSrc: Oral  Height: 5\' 1"  (1.549 m)  Weight: 245 lb 9.6 oz (111.403 kg)  SpO2: 99%   General: Obese woman, no acute distress.  Boyfriend present in the room.  HEENT:  Tracheostomy in place connected to O2. Normal oral mucosa. MMM.  Lungs: CTA bilaterally. Heart: RRR; no extra sounds or murmurs  Abdomen: Non-distended, normal bowel sounds, soft, nontender; no hepatosplenomegaly  Extremities: Moderate pedal edema. No joint swelling or tenderness. Neurologic:  Normal EOM,  Alert and oriented x3. No obvious neurologic/cranial nerve deficits.  Assessment & Plan:  Discussed case with my attending in the clinic, Dr. Dareen Piano. See problem based charting.

## 2014-08-21 DIAGNOSIS — I272 Other secondary pulmonary hypertension: Secondary | ICD-10-CM | POA: Diagnosis not present

## 2014-08-21 DIAGNOSIS — I5033 Acute on chronic diastolic (congestive) heart failure: Secondary | ICD-10-CM | POA: Diagnosis not present

## 2014-08-21 DIAGNOSIS — E1165 Type 2 diabetes mellitus with hyperglycemia: Secondary | ICD-10-CM | POA: Diagnosis not present

## 2014-08-21 DIAGNOSIS — D860 Sarcoidosis of lung: Secondary | ICD-10-CM | POA: Diagnosis not present

## 2014-08-21 DIAGNOSIS — Z430 Encounter for attention to tracheostomy: Secondary | ICD-10-CM | POA: Diagnosis not present

## 2014-08-21 DIAGNOSIS — E11319 Type 2 diabetes mellitus with unspecified diabetic retinopathy without macular edema: Secondary | ICD-10-CM | POA: Diagnosis not present

## 2014-08-25 ENCOUNTER — Other Ambulatory Visit: Payer: Self-pay

## 2014-08-25 DIAGNOSIS — I272 Other secondary pulmonary hypertension: Secondary | ICD-10-CM | POA: Diagnosis not present

## 2014-08-25 DIAGNOSIS — E1165 Type 2 diabetes mellitus with hyperglycemia: Secondary | ICD-10-CM | POA: Diagnosis not present

## 2014-08-25 DIAGNOSIS — D860 Sarcoidosis of lung: Secondary | ICD-10-CM | POA: Diagnosis not present

## 2014-08-25 DIAGNOSIS — I5033 Acute on chronic diastolic (congestive) heart failure: Secondary | ICD-10-CM | POA: Diagnosis not present

## 2014-08-25 DIAGNOSIS — E11319 Type 2 diabetes mellitus with unspecified diabetic retinopathy without macular edema: Secondary | ICD-10-CM | POA: Diagnosis not present

## 2014-08-25 DIAGNOSIS — Z430 Encounter for attention to tracheostomy: Secondary | ICD-10-CM | POA: Diagnosis not present

## 2014-08-25 NOTE — Patient Outreach (Signed)
Call made to [facility] to request prescription for portable oxygen concentrator. Patient to call [facility] to schedule appointment for dilated eye examination

## 2014-08-25 NOTE — Patient Instructions (Signed)
[facility] Transition of care continues with telephone next week. Next [facility] home visit Sep 21, 2014

## 2014-08-26 DIAGNOSIS — E11319 Type 2 diabetes mellitus with unspecified diabetic retinopathy without macular edema: Secondary | ICD-10-CM | POA: Diagnosis not present

## 2014-08-26 DIAGNOSIS — I5033 Acute on chronic diastolic (congestive) heart failure: Secondary | ICD-10-CM | POA: Diagnosis not present

## 2014-08-26 DIAGNOSIS — I272 Other secondary pulmonary hypertension: Secondary | ICD-10-CM | POA: Diagnosis not present

## 2014-08-26 DIAGNOSIS — Z430 Encounter for attention to tracheostomy: Secondary | ICD-10-CM | POA: Diagnosis not present

## 2014-08-26 DIAGNOSIS — E1165 Type 2 diabetes mellitus with hyperglycemia: Secondary | ICD-10-CM | POA: Diagnosis not present

## 2014-08-26 DIAGNOSIS — D860 Sarcoidosis of lung: Secondary | ICD-10-CM | POA: Diagnosis not present

## 2014-08-27 DIAGNOSIS — I272 Other secondary pulmonary hypertension: Secondary | ICD-10-CM | POA: Diagnosis not present

## 2014-08-27 DIAGNOSIS — Z430 Encounter for attention to tracheostomy: Secondary | ICD-10-CM | POA: Diagnosis not present

## 2014-08-27 DIAGNOSIS — E1165 Type 2 diabetes mellitus with hyperglycemia: Secondary | ICD-10-CM | POA: Diagnosis not present

## 2014-08-27 DIAGNOSIS — E11319 Type 2 diabetes mellitus with unspecified diabetic retinopathy without macular edema: Secondary | ICD-10-CM | POA: Diagnosis not present

## 2014-08-27 DIAGNOSIS — I5033 Acute on chronic diastolic (congestive) heart failure: Secondary | ICD-10-CM | POA: Diagnosis not present

## 2014-08-27 DIAGNOSIS — D860 Sarcoidosis of lung: Secondary | ICD-10-CM | POA: Diagnosis not present

## 2014-08-28 DIAGNOSIS — I272 Other secondary pulmonary hypertension: Secondary | ICD-10-CM | POA: Diagnosis not present

## 2014-08-28 DIAGNOSIS — E11319 Type 2 diabetes mellitus with unspecified diabetic retinopathy without macular edema: Secondary | ICD-10-CM | POA: Diagnosis not present

## 2014-08-28 DIAGNOSIS — E1165 Type 2 diabetes mellitus with hyperglycemia: Secondary | ICD-10-CM | POA: Diagnosis not present

## 2014-08-28 DIAGNOSIS — Z430 Encounter for attention to tracheostomy: Secondary | ICD-10-CM | POA: Diagnosis not present

## 2014-08-28 DIAGNOSIS — I5033 Acute on chronic diastolic (congestive) heart failure: Secondary | ICD-10-CM | POA: Diagnosis not present

## 2014-08-28 DIAGNOSIS — D860 Sarcoidosis of lung: Secondary | ICD-10-CM | POA: Diagnosis not present

## 2014-09-01 ENCOUNTER — Encounter: Payer: Self-pay | Admitting: Internal Medicine

## 2014-09-01 ENCOUNTER — Encounter: Payer: Self-pay | Admitting: Licensed Clinical Social Worker

## 2014-09-01 ENCOUNTER — Encounter: Payer: Self-pay | Admitting: *Deleted

## 2014-09-01 ENCOUNTER — Ambulatory Visit (INDEPENDENT_AMBULATORY_CARE_PROVIDER_SITE_OTHER): Payer: Medicare Other | Admitting: Internal Medicine

## 2014-09-01 VITALS — BP 130/86 | HR 117 | Ht 61.0 in | Wt 244.2 lb

## 2014-09-01 DIAGNOSIS — D869 Sarcoidosis, unspecified: Secondary | ICD-10-CM

## 2014-09-01 DIAGNOSIS — J9612 Chronic respiratory failure with hypercapnia: Secondary | ICD-10-CM | POA: Diagnosis not present

## 2014-09-01 DIAGNOSIS — D860 Sarcoidosis of lung: Secondary | ICD-10-CM | POA: Diagnosis not present

## 2014-09-01 DIAGNOSIS — I5033 Acute on chronic diastolic (congestive) heart failure: Secondary | ICD-10-CM | POA: Diagnosis not present

## 2014-09-01 DIAGNOSIS — E11319 Type 2 diabetes mellitus with unspecified diabetic retinopathy without macular edema: Secondary | ICD-10-CM | POA: Diagnosis not present

## 2014-09-01 DIAGNOSIS — I272 Other secondary pulmonary hypertension: Secondary | ICD-10-CM | POA: Diagnosis not present

## 2014-09-01 DIAGNOSIS — E1165 Type 2 diabetes mellitus with hyperglycemia: Secondary | ICD-10-CM | POA: Diagnosis not present

## 2014-09-01 DIAGNOSIS — Z430 Encounter for attention to tracheostomy: Secondary | ICD-10-CM | POA: Diagnosis not present

## 2014-09-01 NOTE — Progress Notes (Signed)
Please let me know how we can help with this. Does this need to go through her pulmonary doctor?

## 2014-09-01 NOTE — Assessment & Plan Note (Addendum)
w/ liver involvement per biopsy 03/2008   - PFT's 11/21/2011 FEV1 1.23 (50%) ratio 67 and no better p B2,  DLCO 31 corrects to 73   - trial off chronic prednisone 02/12/2012 > resumed 07/2013    I had an extended discussion with the patient reviewing all relevant studies completed to date and  lasting 15 to 20 minutes of a 25 minute visit on the following ongoing concerns:  1) doing fine with no change symptoms when taking 10 a/w 5 mg dose    2) The goal with a chronic steroid dependent illness is always arriving at the lowest effective dose that controls the disease/symptoms and not accepting a set "formula" which is based on statistics or guidelines that don't always take into account patient  variability or the natural hx of the dz in every individual patient, which may well vary over time.  For now therefore I recommend the patient maintain  A floor of 5 mg daily - if breathing worsens in the absence of an o bviuos vol overload setting, only then should we consider increasing prednisone as of course it may exac a fluid retention issue.  3)  Each maintenance medication was reviewed in detail including most importantly the difference between maintenance and as needed and under what circumstances the prns are to be used.  Please see instructions for details which were reviewed in writing and the patient given a copy.

## 2014-09-01 NOTE — Progress Notes (Signed)
Subjective:     Patient ID: Melody Gray, female   DOB: 1970-08-06   MRN: [id number]   Brief patient profile:  1  yobf  Quit smoking 07/2013  dx of sarcoidosis in 2001 = sob, cough became 02 dep in July [id number] complicated by [facility] and morbid obesity with OHS requiring trach 07/2013    History of Present Illness   January 06, 2010 ov  c/o increased SOB. Pt states outpt clinic advised her to follow up with Dr. Melvyn Novas for Sacoidosis. doe on 02 at 2 lpm sitting then 4 with activity gets off at the curb struggles with grocery store. Try taking Prednisone one half even days in am with breakfast  Be sure you take omeprazole Take one 30-60 min before first and last meals of the day  Stop nifedipine  Start Cardizem 240 mg once daily  Wear 24 hours per day, 2 lpm at rest and sleeping, 4lpm with activity, this is the best way to help your heart function better  CT Chest ( repeated with contrast)> no PE, just sarcoid changes   February 09, 2010 Followup sarcoid w/ PFTs. Breathing is the same- no better or worse.on 02 2 lpm w/in and then outside using 4lpm> cc doe x 50 ft still has to stop every aisle at [employer] but not [facility] parking. rec Start Dulera 2 puffs first thing in am and 2 puffs again in pm about 12 hours later and prednisone 10 mg every other day.      6/10/2015post hosp  f/u ov/ transition of care/Mishayla Sliwinski re: sarcoid / pred at 10 mg / ? Not smoking ? / now trached Chief Complaint  Patient presents with  . Follow-up    Pt states her breathing has improved. No new co's today.   did not understand purpose of PMV leaves it in 24/7 and not using T collar at hs  Edema has resolved  rec Leave the purple plug out while sleeping with humidifed 02 collar in place Prednisone 10 mg daily    02/16/2014 f/u ov/Cherika Jessie re: sarcoid on pred 10 mg daily  Chief Complaint  Patient presents with  . Follow-up    Pt states that her cough is some better- still prod but with clear sputum.  Her breathing is improved  back to her normal baseline.    amb on 02 2lpm can do HT s HC parking rec pred 10 mg a/w 5 mg daily      09/01/2014 f/u ov/Kyce Ging re: sarcoid without apparent flare on 10 a/w 5 mg daily  Chief Complaint  Patient presents with  . Follow-up    Pt states that her breathing is doing well today. She denies any new co's. She rarely uses rescue inhaler.   02 4lpm 24/7 uses trach collar at hs  pred 10 mg a/w 5 mg daily  Flares of sob occur with fluid retention but all adjustments per cards to diuretic rx   No obvious day to day or daytime variabilty or assoc cp or chest tightness, subjective wheeze overt sinus or hb symptoms. No unusual exp hx or h/o childhood pna/ asthma or knowledge of premature birth.  Sleeping ok on t collar without nocturnal  or early am exacerbation  of respiratory  c/o's or need for noct saba. Also denies any obvious fluctuation of symptoms with weather or environmental changes or other aggravating or alleviating factors except as outlined above   Current Medications, Allergies, Complete Past Medical History, Past Surgical History, Family  History, and Social History were reviewed in [employer] record.  ROS  The following are not active complaints unless bolded sore throat, dysphagia, dental problems, itching, sneezing,  nasal congestion or excess/ purulent secretions, ear ache,   fever, chills, sweats, unintended wt loss, pleuritic or exertional cp, hemoptysis,  orthopnea pnd or leg swelling, presyncope, palpitations, heartburn, abdominal pain, anorexia, nausea, vomiting, diarrhea  or change in bowel or urinary habits, change in stools or urine, dysuria,hematuria,  rash, arthralgias, visual complaints, headache, numbness weakness or ataxia or problems with walking or coordination,  change in mood/affect or memory.         Past Medical History:  Sarcoidosis (Dr Verlaine Embry)-w/ liver involvement per biopsy 03/2008 - Reversible airway component so start [facility]  01/2010 > better  - HFa 75% p coaching May 04, 2010  -  Off chronic prednisone 05/2011> restarted 07/2013  Unexplained Hypoxemia July 2011  - CT angiogram 01/07/10 >>> no PE  - PFT's February 09, 2010 FEV1 1.20 (49%) with ratio 72 and FRC 90%, FEV1  16% better p B2, DLC0 33% > corrects to 84   Morbid obesity  - Target wt = 153 for BMI < 30  - Trach 08/10/13 with very difficult airway([place]) QT prolongation  Diabetes mellitus, type II  Hypertension  Heart catheterization 06-06-06 : No CAD, no RAS, normal EF  Hx eclampsia  Hx of scalp seborrheic dermatitis            Objective:   Physical Exam  She is a minimally  cushingoid-appearing amb bf with trach in place / pmv in place on nasal 02 4lpm   wt 238 January 06, 2010 >   217 May 04, 2010 >  02/12/2012  213 >  215 04/04/2012  > 239 04/08/2013 > 07/24/2013 249 > 07/31/2013  252 >   10/08/2013  233> 11/19/2013  242 >245 12/02/2013 >241 01/29/2014 > 02/16/2014  250 > 09/01/2014   244        GEN: A/Ox3; pleasant , NAD  HEENT:  [place]/AT,  EACs-clear, TMs-wnl, NOSE-clear, THROAT-clear, no lesions, no postnasal drip or exudate noted.   NECK:  Supple w/ fair ROM; no JVD; normal carotid impulses w/o bruits; no thyromegaly or nodules palpated; no lymphadenopathy.Trach midline, no redness, dsg clean and dry   RESP  Decreased BS in bases, no accessory muscle use, no dullness to percussion  CARD:  RRR, no m/r/g,  pulses intact, no cyanosis or clubbing  1-2 plus pitting edema both legs sym   GI:   Soft & nt; nml bowel sounds; no organomegaly or masses detected.  Musco: Warm bil, no deformities or joint swelling noted.   Neuro: alert, no focal deficits noted.    Skin: Warm, no lesions or rashes     I personally reviewed images and agree with radiology impression as follows:  CXR:  08/09/14 Streaky and interstitial bilateral mid and lower lung opacity has mildly progressed. Differential considerations include increased atelectasis, mild  interstitial edema, or less likely bilateral pneumonia. No pleural effusion identified.     Assessment:

## 2014-09-01 NOTE — Assessment & Plan Note (Addendum)
-    Complicated by PAH - Walked 3 laps @ 185 ft each stopped due to  End of test, no desat on 4lpm 10/05/2010  - 02/02/2011  Walked RA  2 laps @ 185 ft each stopped due to  desat to 88 so change rx to 2lpm with activity outside the house and at hs - 08/21/2011  Walked RA x 1 laps @ 185 ft each stopped due to desat 85% - 11/21/2011  Walked RA  2 laps @ 185 ft each stopped due to  Sat 88 -  ONO RA < 89% x 6h 26 min 03/19/12 > repeat on 2lpm 04/05/12 > desats resolved - 04/08/2013   Walked RA x one lap @ 185 stopped due to  sats 85%  - HCO3 = 40 07/31/13 c/w hypercarbia - 08/10/13 Trach emergently by Janace Hoard with very difficult airway  - 02/16/2014   Walked 2lpm x 2 laps @ 185 stopped due to  Sob and desat to 81 slow pace > rec increase to 2lpm walking more than room to room   rx as of  09/01/2014 = 4lpm 24/7 >  Well compensated/ reviewed rx

## 2014-09-01 NOTE — Progress Notes (Signed)
Patient ID: Melody Gray, female   DOB: [birth year], 44 y.o.   MRN: [id number] CSW received email from [place], [place] on 08/25/14:  "I am writing to ask for your assistance in getting my patient, [name], DOB [birth year] a portable oxygen system.   She has respiratory services with [facility] already. If possible, she would like a [employer] system.   anything you can do to get a her something other than the heavy tanks she has would be appreciated."

## 2014-09-01 NOTE — Patient Instructions (Addendum)
Try prednisone 5 mg daily to see if you don't do just as well with your breathing and if so leave it at 5 mg daily   Please schedule a follow up visit in 3 months but call sooner if needed.

## 2014-09-02 DIAGNOSIS — D860 Sarcoidosis of lung: Secondary | ICD-10-CM | POA: Diagnosis not present

## 2014-09-02 DIAGNOSIS — E11319 Type 2 diabetes mellitus with unspecified diabetic retinopathy without macular edema: Secondary | ICD-10-CM | POA: Diagnosis not present

## 2014-09-02 DIAGNOSIS — Z430 Encounter for attention to tracheostomy: Secondary | ICD-10-CM | POA: Diagnosis not present

## 2014-09-02 DIAGNOSIS — I5033 Acute on chronic diastolic (congestive) heart failure: Secondary | ICD-10-CM | POA: Diagnosis not present

## 2014-09-02 DIAGNOSIS — I272 Other secondary pulmonary hypertension: Secondary | ICD-10-CM | POA: Diagnosis not present

## 2014-09-02 DIAGNOSIS — E1165 Type 2 diabetes mellitus with hyperglycemia: Secondary | ICD-10-CM | POA: Diagnosis not present

## 2014-09-03 DIAGNOSIS — D860 Sarcoidosis of lung: Secondary | ICD-10-CM | POA: Diagnosis not present

## 2014-09-03 DIAGNOSIS — E11319 Type 2 diabetes mellitus with unspecified diabetic retinopathy without macular edema: Secondary | ICD-10-CM | POA: Diagnosis not present

## 2014-09-03 DIAGNOSIS — Z430 Encounter for attention to tracheostomy: Secondary | ICD-10-CM | POA: Diagnosis not present

## 2014-09-03 DIAGNOSIS — E1165 Type 2 diabetes mellitus with hyperglycemia: Secondary | ICD-10-CM | POA: Diagnosis not present

## 2014-09-03 DIAGNOSIS — I5033 Acute on chronic diastolic (congestive) heart failure: Secondary | ICD-10-CM | POA: Diagnosis not present

## 2014-09-03 DIAGNOSIS — I272 Other secondary pulmonary hypertension: Secondary | ICD-10-CM | POA: Diagnosis not present

## 2014-09-03 NOTE — Progress Notes (Signed)
Cardiology Office Note   Date:  09/03/2014   ID:  DANAIJAH Gray, DOB May 08, 1970, MRN [id number]  PCP:  Jerene Pitch, MD  Cardiologist:  Dr. Kirk Ruths     Chief Complaint  Patient presents with  . Congestive Heart Failure     History of Present Illness: Melody Gray is a 44 y.o. 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  Initially seen by Dr. Kirk Ruths in 2012 for abnormal ECG.  Echo demonstrated an EF 35%.  Cardiac MRI 7/12 demonstrated mod to severe LV systolic dysfunction with EF 31%, severely hypokinetic anterior and anterolateral walls, mild RVE and mild RV systolic dysfunction and possible small areas of mid wall delayed enhancement in the apical septal and apical lateral walls.  These areas were very faint and not definitely of clinical significance. Mid wall enhancement can be found in infiltrative cardiomyopathies such as that due to sarcoidosis. TSH and ferritin normal. Cardiac catheterization repeated in September of 2012 and showed an ejection fraction of 40%. There was no coronary disease. Echo repeated in June 2015 and showed normal LV function, grade 2 diastolic dysfunction, mild LAE, mild RAE; mild RVE with reduced function. Lower ext dopplers 8/15 showed no DVT.  Last seen by Dr. Kirk Ruths 06/2014.  Admitted 4/9-4/16 with acute R heart failure.  Echo demonstrated normal LV function and severe pulmonary HTN.  She underwent RHC that demonstrated elevated CVP and moderate pulmonary HTN.  Pulmonary HTN was felt to be due to sarcoidosis, OSA, OHS.  Selective pulmonary vasodilators were not recommended.  She was diuresed with Torsemide.  Here Creatinine rose to 3.34 and she was seen by Nephrology who thought her AKI was related to over-diuresis.    She returns for FU.    Studies/Reports Reviewed Today:  RHC 08/12/14 RA = 18 RV = 70/13/18 PA = 68/24 (45) PCW = 21 Fick cardiac output/index =  6.5/3.1 PVR = 4.0 Ao sat = 94% PA sat = 62%, 62% RA sat = 65%  Assessment: 1. Moderate pulmonary HTN with normal cardiac output and minimally elevated left-sided pressures  Plan/Discussion:  She has moderate pulmonary HTN likely Who Group 3 with markedly elevated CVP. Left-sided pressures relatively normal.   Would place on po torsemide and see if we can continue to diurese her slowly without recurrent renal failure.   Continue supplemental O2. Needs weight loss. Would not start on selective pulmonary vasodilators at this point.   Echo 08/10/14 - EF 55% to 60%. Wall motion was normal;  Left ventricular diastolic function parameters were normal. - Ventricular septum: The contour showed diastolic flattening and systolic flattening. These changes are consistent with RV volume and pressure overload. - Left atrium: The atrium was mildly dilated. - Right ventricle: The cavity size was moderately dilated. - Right atrium: The atrium was mildly dilated. - Tricuspid valve: There was moderate regurgitation directed   centrally. - PA peak pressure: 83 mm Hg (S).    Past Medical History  Diagnosis Date  . Sarcoidosis     Followed by Dr. Melvyn Novas; w/ liver involvement per biopsy 12/09, Reversible airway component so started on [facility] 01/2010; HFA 75% p coaching 05/2010  . Hypoxemia     CT angiogram 9/11>> No PE; PFTs 10/11- FEV1 1.20 (49%) with 16% better p B2, DLCO 33%> corrects to 84; O2 sats ok on 4 lpm X rapid walk X 3 laps 05/2010  . Morbid obesity  Target wt= 153 for BMI <30  . QT prolongation   . Hypertension   . Hx of cardiac cath 2/08    No CAD, no RAS,  normal EF  . Seborrheic dermatitis of scalp   . 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 Hep B serology neg. but Hep C ab +, HIV neg. 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 Cardiomyopathy, nonischemic     EF 45% 12/10; Echo 7/11 normal EF, PAS 48  .  Diabetic retinopathy     Right eye 2/11  . Health maintenance examination     Mammogram 05/2010 Negative; Last Pap smear 03/2008; Last DM eye exam 2/11> mild non-proliferative diabetic retinopathy. OD  . Helicobacter pylori ab+ 05/2011    Pt was symptomatic and treatment planned for 05/2011  . CHF (congestive heart failure)   . Complication of anesthesia     " difficult waking "  . Sleep apnea   . Diabetes mellitus     insulin dependent  . Acute bronchitis 07/29/2014    Past Surgical History  Procedure Laterality Date  . Tubal ligation    . Breast surgery    . Cesarean section    . Tracheostomy tube placement N/A 08/10/2013    Procedure: TRACHEOSTOMY;  Surgeon: Melissa Montane, MD;  Location: [facility];  Service: ENT;  Laterality: N/A;  . Right heart catheterization N/A 08/12/2014    Procedure: RIGHT HEART CATH;  Surgeon: Jolaine Artist, MD;  Location: [facility] C[facility] LAB;  Service: Cardiovascular;  Laterality: N/A;     Current Outpatient Prescriptions  Medication Sig Dispense Refill  . albuterol (PROVENTIL HFA;VENTOLIN HFA) 108 (90 BASE) MCG/ACT inhaler Inhale 2 puffs into the lungs every 6 (six) hours as needed for wheezing or shortness of breath. 1 Inhaler 2  . atorvastatin (LIPITOR) 20 MG tablet Take 1 tablet (20 mg total) by mouth daily at 6 PM. 90 tablet 3  . B-D INS SYRINGE 0.5CC/30GX1/2" 30G X 1/2" 0.5 ML MISC USE AS DIRECTED 100 each 1  . famotidine (PEPCID) 20 MG tablet Take 20 mg by mouth daily.  1  . ferrous sulfate 325 (65 FE) MG tablet Take 1 tablet (325 mg total) by mouth 3 (three) times daily with meals. (Patient not taking: Reported on 09/01/2014) 90 tablet 3  . fluticasone (FLONASE) 50 MCG/ACT nasal spray Place 2 sprays into both nostrils daily. 16 g 2  . insulin aspart (NOVOLOG) 100 UNIT/ML injection Take novolog per sliding scale: CBG 70-120: 0 units  CBG 121-150: 1 unit  CBG 151-200: 2 units  CBG 201-250: 3 units  CBG 251-300: 5 units  CBG 301-350: 7 units  CBG 351-400: 9  units  CBG > 400: call MD 30 mL 3  . insulin detemir (LEVEMIR) 100 UNIT/ML injection Inject 0.46 mLs (46 Units total) into the skin at bedtime. 10 mL 11  . Melatonin 3 MG TABS Take 1 tablet (3 mg total) by mouth at bedtime as needed.  0  . metFORMIN (GLUCOPHAGE) 1000 MG tablet take 1 tablet by mouth twice a day with meals 180 tablet 4  . potassium chloride SA (K-DUR,KLOR-CON) 20 MEQ tablet Take 1 tablet (20 mEq total) by mouth daily. 30 tablet 0  . predniSONE (DELTASONE) 10 MG tablet Take 1 tablet (10 mg total) by mouth every other day. 30 tablet 0  . predniSONE (DELTASONE) 5 MG tablet Take 1  tablet (5 mg total) by mouth every other day. 30 tablet 0  . torsemide (DEMADEX) 20 MG tablet Take 1 tablet (20 mg total) by mouth daily. 30 tablet 1   No current facility-administered medications for this visit.    Allergies:   Vicodin    Social History:  The patient  reports that she has quit smoking. Her smoking use included Cigarettes. She has a 5 pack-year smoking history. She has never used smokeless tobacco. She reports that she does not drink alcohol or use illicit drugs.   Family History:  The patient's family history includes Asthma in her sister; Cancer in her mother; Diabetes in her father; Hypertension in an other family member; Multiple sclerosis in her father.    ROS:   Please see the history of present illness.   ROS    PHYSICAL EXAM: VS:  There were no vitals taken for this visit.    Wt Readings from Last 3 Encounters:  09/01/14 244 lb 3.2 oz (110.768 kg)  08/19/14 245 lb 9.6 oz (111.403 kg)  08/16/14 246 lb 1.6 oz (111.63 kg)     GEN: Well nourished, well developed, in no acute distress HEENT: normal Neck: no JVD, no carotid bruits, no masses Cardiac:  Normal S1/S2, RRR; no murmur ,  no rubs or gallops, no edema  Respiratory:  clear to auscultation bilaterally, no wheezing, rhonchi or rales. GI: soft, nontender, nondistended, + BS MS: no deformity or atrophy Skin: warm  and dry  Neuro:  CNs II-XII intact, Strength and sensation are intact Psych: Normal affect   EKG:  EKG is ordered today.  It demonstrates:      Recent Labs: 03/27/2014: Pro B Natriuretic peptide (BNP) 2262.0* 08/08/2014: ALT 15; B Natriuretic Peptide 853.4* 08/09/2014: Magnesium 1.4* 08/12/2014: Hemoglobin 10.7*; Platelets 217 08/19/2014: BUN 15; Creatinine 1.34*; Potassium 4.8; Sodium 138    Lipid Panel    Component Value Date/Time   CHOL 238* 01/02/2014 1526   TRIG 252* 01/02/2014 1526   HDL 70 01/02/2014 1526   CHOLHDL 3.4 01/02/2014 1526   VLDL 50* 01/02/2014 1526   LDLCALC 118* 01/02/2014 1526   LDLDIRECT 126* 02/09/2009 1956      ASSESSMENT AND PLAN:  Cor pulmonale  Pulmonary hypertension associated with sarcoidosis  Cardiomyopathy  Essential hypertension  Hyperlipidemia with target LDL less than 100  OBESITY, MORBID  CKD (chronic kidney disease) stage 3, GFR 30-59 ml/min  Chronic respiratory failure, unspecified whether with hypoxia or hypercapnia  Tracheostomy status    Current medicines are reviewed at length with the patient today.  Concerns regarding medicines are as outlined above.  The following changes have been made:       Labs/ tests ordered today include:  No orders of the defined types were placed in this encounter.    Disposition:   FU with    Signed, Versie Starks, MHS 09/03/2014 11:20 PM    [facility] [address], [place], [place]  [address] Phone: [phone]; Fax: [phone]    This encounter was created in error - please disregard.

## 2014-09-04 ENCOUNTER — Encounter: Payer: Medicare Other | Admitting: Physician Assistant

## 2014-09-04 DIAGNOSIS — Z430 Encounter for attention to tracheostomy: Secondary | ICD-10-CM | POA: Diagnosis not present

## 2014-09-04 DIAGNOSIS — E1165 Type 2 diabetes mellitus with hyperglycemia: Secondary | ICD-10-CM | POA: Diagnosis not present

## 2014-09-04 DIAGNOSIS — D860 Sarcoidosis of lung: Secondary | ICD-10-CM | POA: Diagnosis not present

## 2014-09-04 DIAGNOSIS — E11319 Type 2 diabetes mellitus with unspecified diabetic retinopathy without macular edema: Secondary | ICD-10-CM | POA: Diagnosis not present

## 2014-09-04 DIAGNOSIS — I272 Other secondary pulmonary hypertension: Secondary | ICD-10-CM | POA: Diagnosis not present

## 2014-09-04 DIAGNOSIS — I5033 Acute on chronic diastolic (congestive) heart failure: Secondary | ICD-10-CM | POA: Diagnosis not present

## 2014-09-07 ENCOUNTER — Telehealth: Payer: Self-pay | Admitting: *Deleted

## 2014-09-07 NOTE — Telephone Encounter (Signed)
Thank you :)

## 2014-09-07 NOTE — Telephone Encounter (Signed)
Call from pt - states she has an eye appt at Dr Zenia Resides office on May 27 @ 1115AM. Dr Eula Fried informed.

## 2014-09-08 DIAGNOSIS — D860 Sarcoidosis of lung: Secondary | ICD-10-CM | POA: Diagnosis not present

## 2014-09-08 DIAGNOSIS — I5033 Acute on chronic diastolic (congestive) heart failure: Secondary | ICD-10-CM | POA: Diagnosis not present

## 2014-09-08 DIAGNOSIS — Z430 Encounter for attention to tracheostomy: Secondary | ICD-10-CM | POA: Diagnosis not present

## 2014-09-08 DIAGNOSIS — I272 Other secondary pulmonary hypertension: Secondary | ICD-10-CM | POA: Diagnosis not present

## 2014-09-08 DIAGNOSIS — E1165 Type 2 diabetes mellitus with hyperglycemia: Secondary | ICD-10-CM | POA: Diagnosis not present

## 2014-09-08 DIAGNOSIS — E11319 Type 2 diabetes mellitus with unspecified diabetic retinopathy without macular edema: Secondary | ICD-10-CM | POA: Diagnosis not present

## 2014-09-09 ENCOUNTER — Ambulatory Visit (HOSPITAL_COMMUNITY)
Admission: RE | Admit: 2014-09-09 | Discharge: 2014-09-09 | Disposition: A | Discharge status: Home / Self Care | Payer: Medicare Other | Source: Ambulatory Visit | Attending: Acute Care | Admitting: Acute Care

## 2014-09-09 ENCOUNTER — Other Ambulatory Visit: Payer: Self-pay | Admitting: Acute Care

## 2014-09-09 DIAGNOSIS — I5033 Acute on chronic diastolic (congestive) heart failure: Secondary | ICD-10-CM | POA: Diagnosis not present

## 2014-09-09 DIAGNOSIS — E1165 Type 2 diabetes mellitus with hyperglycemia: Secondary | ICD-10-CM | POA: Diagnosis not present

## 2014-09-09 DIAGNOSIS — I509 Heart failure, unspecified: Secondary | ICD-10-CM | POA: Insufficient documentation

## 2014-09-09 DIAGNOSIS — Z930 Tracheostomy status: Secondary | ICD-10-CM

## 2014-09-09 DIAGNOSIS — I272 Other secondary pulmonary hypertension: Secondary | ICD-10-CM | POA: Diagnosis not present

## 2014-09-09 DIAGNOSIS — E11319 Type 2 diabetes mellitus with unspecified diabetic retinopathy without macular edema: Secondary | ICD-10-CM | POA: Diagnosis not present

## 2014-09-09 DIAGNOSIS — Z9119 Patient's noncompliance with other medical treatment and regimen: Secondary | ICD-10-CM | POA: Diagnosis not present

## 2014-09-09 DIAGNOSIS — J9610 Chronic respiratory failure, unspecified whether with hypoxia or hypercapnia: Secondary | ICD-10-CM | POA: Insufficient documentation

## 2014-09-09 DIAGNOSIS — Z87891 Personal history of nicotine dependence: Secondary | ICD-10-CM | POA: Diagnosis not present

## 2014-09-09 DIAGNOSIS — D860 Sarcoidosis of lung: Secondary | ICD-10-CM | POA: Diagnosis not present

## 2014-09-09 DIAGNOSIS — Z430 Encounter for attention to tracheostomy: Secondary | ICD-10-CM | POA: Insufficient documentation

## 2014-09-09 DIAGNOSIS — E6609 Other obesity due to excess calories: Secondary | ICD-10-CM | POA: Insufficient documentation

## 2014-09-09 DIAGNOSIS — D869 Sarcoidosis, unspecified: Secondary | ICD-10-CM | POA: Insufficient documentation

## 2014-09-09 NOTE — Progress Notes (Signed)
Tracheostomy Procedure Note  Melody Gray [id number] 05-Sep-1970  Pre Procedure Tracheostomy Information  Trach Brand: Shiley Size: 4.0 Style: Uncuffed Secured by: Velcro   Procedure: Trach cleaning and trach change    Post Procedure Tracheostomy Information  Trach Brand: Shiley Size: 4.0 Style: Uncuffed Secured by: Velcro   Post Procedure Evaluation:  ETCO2 positive color change from yellow to purple : Yes.   Vital signs:blood pressure 149/96, pulse100 , respirations 18 and pulse oximetry 99% on 4 LPM [place] Patients current condition: stable Complications: No apparent complications Trach site exam: clean Wound care done: dry Patient did tolerate procedure well.  Vitals post trach change  153/84  RR 18  Hr 100  O2 sats 100% on 4 LPM [place] Vtials 15 minutes post change  147/96  RR 16 Hr 101  O2 sats 100% on 4 LPM  [place]  Education: none  Prescription needs:  new orders to [facility],NP  For additional trach and trach supplies)  PT given additional size 4 uncuffed trach and obturator from trach placed today.  Pt given back her passy muir vlalve.    Additional needs: None needed

## 2014-09-09 NOTE — Progress Notes (Signed)
CC: Trach change  Brief History  This is a 44 year old female f/b Dr Melvyn Novas for chronic respiratory failure in the setting of OHS/OSA, Sarcoidosis and secondary PAH. Has a long standing h/o tobacco abuse and medical non-adherence. Since her trach she has had some repeat visits to the ER and admits re: URIs and PNA, but has been doing well from a trach stand-point. I saw her last on 2/4. At that time she was doing well. We were considering change to Portex 6 cuffless for comfort. Just recently she was at home and had been having trouble w/ her 6 cuffless inner cannula coming out any time she would cough. She ran out of inner cannulas and she and her husband tried to place her spare 6 cuffless and were not able to. They had a 4 cuffless at home and placed this instead. This was about 2 weeks ago. She has had no issues with this.   Review of Systems:  Bolds are positive  Constitutional: weight loss, gain, night sweats, Fevers, chills, fatigue .  HEENT: headaches, Sore throat, sneezing, nasal congestion, post nasal drip, Difficulty swallowing, Tooth/dental problems, visual complaints visual changes, ear ache CV: chest pain, radiates: ,Orthopnea, PND, swelling in lower extremities, dizziness, palpitations, syncope.  GI heartburn, indigestion, abdominal pain, nausea, vomiting, diarrhea, change in bowel habits, loss of appetite, bloody stools.  Resp: cough, productive: , hemoptysis, dyspnea, chest pain, pleuritic.  Skin: rash or itching or icterus GU: dysuria, change in color of urine, urgency or frequency. flank pain, hematuria  MS: joint pain or swelling. decreased range of motion  Psych: change in mood or affect. depression or anxiety.  Neuro: difficulty with speech, weakness, numbness, ataxia    HR 80, rr 18, bp 153/84, sats 94% on 4 liters  General appearance: 44 year old  Mouth: membranes and no mucosal ulcerations; normal hard and soft palate Neck: Trachea midline, # 4 trach,  uncuffed trach stoma unremarkable .  Lungs: CTA, with normal respiratory effort and no intercostal retractions CV: RRR, no MRGs  Abdomen: Soft, non-tender; no masses or HSM Extremities: No peripheral edema or extremity lymphadenopathy Skin: Normal temperature, turgor and texture; no rash, ulcers or subcutaneous nodules Psych: Appropriate affect, alert and oriented to person, place and time Date  Seen by:  Melody Gray size  Trach change pmv  Capping  Stoma site DME Diet  Goals   7/29 yacoub 6 cuffless no        10/13 Melody Gray 6 shiley cufless  yes yes n/a Unremarkable   Regular  Continue day time trach w/ PMV  No decannulation.   06/04/14 Melody Gray 6 shiley cuffless  yes yes no Looks good   Regular  cont same plan of care. Will decide on Portex change next time.   5/11 Melody Gray 4 shiley cuffless  yes yes  excellent advanced Regular  No change                 Impression  44 year old with morbid obesity, OHS/OSA, sarcoid, secondary PAH and heart failure. Trached emergently for hypercarbic respiratory failure. Not a candidate for decannulation   Recently old #6 removed. She placed #4 as could not get 6 back in. Stoma looks good. Has tolerated #4 well w/ good cough and mucous clearance. The stoma was to small to go back up to #6 w/out sig trauma and not sure that this is needed as if she were to ever require ventilation she might still require dilation to go up from  6 cuffless to cuff. Felt better to hold the course w/ what is working now.   Plan:  - cont 4 cuffless  - ROV 3 months.   Melody Gray ACNP-BC [place] Pager # [phone] OR # [phone] if no answer

## 2014-09-10 ENCOUNTER — Encounter: Payer: Self-pay | Admitting: *Deleted

## 2014-09-10 ENCOUNTER — Other Ambulatory Visit: Payer: Self-pay

## 2014-09-10 DIAGNOSIS — E1165 Type 2 diabetes mellitus with hyperglycemia: Secondary | ICD-10-CM | POA: Diagnosis not present

## 2014-09-10 DIAGNOSIS — I5033 Acute on chronic diastolic (congestive) heart failure: Secondary | ICD-10-CM | POA: Diagnosis not present

## 2014-09-10 DIAGNOSIS — I272 Other secondary pulmonary hypertension: Secondary | ICD-10-CM | POA: Diagnosis not present

## 2014-09-10 DIAGNOSIS — Z430 Encounter for attention to tracheostomy: Secondary | ICD-10-CM | POA: Diagnosis not present

## 2014-09-10 DIAGNOSIS — E11319 Type 2 diabetes mellitus with unspecified diabetic retinopathy without macular edema: Secondary | ICD-10-CM | POA: Diagnosis not present

## 2014-09-10 DIAGNOSIS — D860 Sarcoidosis of lung: Secondary | ICD-10-CM | POA: Diagnosis not present

## 2014-09-11 ENCOUNTER — Ambulatory Visit (INDEPENDENT_AMBULATORY_CARE_PROVIDER_SITE_OTHER): Payer: Medicare Other | Admitting: Internal Medicine

## 2014-09-11 ENCOUNTER — Encounter: Payer: Self-pay | Admitting: Internal Medicine

## 2014-09-11 VITALS — BP 134/81 | HR 98 | Temp 98.2°F | Wt 237.9 lb

## 2014-09-11 DIAGNOSIS — IMO0002 Reserved for concepts with insufficient information to code with codable children: Secondary | ICD-10-CM

## 2014-09-11 DIAGNOSIS — I130 Hypertensive heart and chronic kidney disease with heart failure and stage 1 through stage 4 chronic kidney disease, or unspecified chronic kidney disease: Secondary | ICD-10-CM | POA: Diagnosis not present

## 2014-09-11 DIAGNOSIS — I5033 Acute on chronic diastolic (congestive) heart failure: Secondary | ICD-10-CM | POA: Diagnosis not present

## 2014-09-11 DIAGNOSIS — D631 Anemia in chronic kidney disease: Secondary | ICD-10-CM | POA: Diagnosis not present

## 2014-09-11 DIAGNOSIS — Z7952 Long term (current) use of systemic steroids: Secondary | ICD-10-CM | POA: Diagnosis not present

## 2014-09-11 DIAGNOSIS — N189 Chronic kidney disease, unspecified: Secondary | ICD-10-CM

## 2014-09-11 DIAGNOSIS — E1165 Type 2 diabetes mellitus with hyperglycemia: Secondary | ICD-10-CM | POA: Diagnosis not present

## 2014-09-11 DIAGNOSIS — D539 Nutritional anemia, unspecified: Secondary | ICD-10-CM | POA: Diagnosis not present

## 2014-09-11 DIAGNOSIS — I10 Essential (primary) hypertension: Secondary | ICD-10-CM

## 2014-09-11 DIAGNOSIS — Z6841 Body Mass Index (BMI) 40.0 and over, adult: Secondary | ICD-10-CM

## 2014-09-11 DIAGNOSIS — L853 Xerosis cutis: Secondary | ICD-10-CM

## 2014-09-11 DIAGNOSIS — E1122 Type 2 diabetes mellitus with diabetic chronic kidney disease: Secondary | ICD-10-CM | POA: Diagnosis not present

## 2014-09-11 DIAGNOSIS — D376 Neoplasm of uncertain behavior of liver, gallbladder and bile ducts: Secondary | ICD-10-CM | POA: Diagnosis not present

## 2014-09-11 DIAGNOSIS — F17210 Nicotine dependence, cigarettes, uncomplicated: Secondary | ICD-10-CM | POA: Diagnosis not present

## 2014-09-11 DIAGNOSIS — E11329 Type 2 diabetes mellitus with mild nonproliferative diabetic retinopathy without macular edema: Secondary | ICD-10-CM | POA: Diagnosis not present

## 2014-09-11 DIAGNOSIS — I5032 Chronic diastolic (congestive) heart failure: Secondary | ICD-10-CM | POA: Diagnosis not present

## 2014-09-11 DIAGNOSIS — Z794 Long term (current) use of insulin: Secondary | ICD-10-CM | POA: Diagnosis not present

## 2014-09-11 DIAGNOSIS — E113299 Type 2 diabetes mellitus with mild nonproliferative diabetic retinopathy without macular edema, unspecified eye: Secondary | ICD-10-CM

## 2014-09-11 DIAGNOSIS — Z430 Encounter for attention to tracheostomy: Secondary | ICD-10-CM | POA: Diagnosis not present

## 2014-09-11 DIAGNOSIS — Z0000 Encounter for general adult medical examination without abnormal findings: Secondary | ICD-10-CM

## 2014-09-11 DIAGNOSIS — E11319 Type 2 diabetes mellitus with unspecified diabetic retinopathy without macular edema: Secondary | ICD-10-CM | POA: Diagnosis not present

## 2014-09-11 DIAGNOSIS — I272 Other secondary pulmonary hypertension: Secondary | ICD-10-CM | POA: Diagnosis not present

## 2014-09-11 DIAGNOSIS — D860 Sarcoidosis of lung: Secondary | ICD-10-CM | POA: Diagnosis not present

## 2014-09-11 LAB — CBC WITH DIFFERENTIAL/PLATELET
Basophils Absolute: 0.1 10*3/uL (ref 0.0–0.1)
Basophils Relative: 1 % (ref 0–1)
Eosinophils Absolute: 0.2 10*3/uL (ref 0.0–0.7)
Eosinophils Relative: 2 % (ref 0–5)
HCT: 34.7 % — ABNORMAL LOW (ref 36.0–46.0)
Hemoglobin: 10.9 g/dL — ABNORMAL LOW (ref 12.0–15.0)
Lymphocytes Relative: 26 % (ref 12–46)
Lymphs Abs: 2.2 10*3/uL (ref 0.7–4.0)
MCH: 28.4 pg (ref 26.0–34.0)
MCHC: 31.4 g/dL (ref 30.0–36.0)
MCV: 90.4 fL (ref 78.0–100.0)
MPV: 12 fL (ref 8.6–12.4)
Monocytes Absolute: 0.9 10*3/uL (ref 0.1–1.0)
Monocytes Relative: 11 % (ref 3–12)
Neutro Abs: 5 10*3/uL (ref 1.7–7.7)
Neutrophils Relative %: 60 % (ref 43–77)
Platelets: 273 10*3/uL (ref 150–400)
RBC: 3.84 MIL/uL — ABNORMAL LOW (ref 3.87–5.11)
RDW: 15.7 % — ABNORMAL HIGH (ref 11.5–15.5)
WBC: 8.3 10*3/uL (ref 4.0–10.5)

## 2014-09-11 LAB — VITAMIN B12: Vitamin B-12: 320 pg/mL (ref 211–911)

## 2014-09-11 LAB — BASIC METABOLIC PANEL WITH GFR
BUN: 19 mg/dL (ref 6–23)
CO2: 29 mEq/L (ref 19–32)
Calcium: 8.7 mg/dL (ref 8.4–10.5)
Chloride: 98 mEq/L (ref 96–112)
Creat: 1.13 mg/dL — ABNORMAL HIGH (ref 0.50–1.10)
GFR, Est African American: 68 mL/min
GFR, Est Non African American: 59 mL/min — ABNORMAL LOW
Glucose, Bld: 154 mg/dL — ABNORMAL HIGH (ref 70–99)
Potassium: 4.6 mEq/L (ref 3.5–5.3)
Sodium: 138 mEq/L (ref 135–145)

## 2014-09-11 LAB — IRON AND TIBC
%SAT: 21 % (ref 20–55)
Iron: 71 ug/dL (ref 42–145)
TIBC: 337 ug/dL (ref 250–470)
UIBC: 266 ug/dL (ref 125–400)

## 2014-09-11 LAB — FERRITIN: Ferritin: 72 ng/mL (ref 10–291)

## 2014-09-11 LAB — MAGNESIUM: Magnesium: 1.2 mg/dL — ABNORMAL LOW (ref 1.5–2.5)

## 2014-09-11 NOTE — Assessment & Plan Note (Addendum)
She is overdue for a mammogram and needs a repeat papsmear as well. Will place order for mammogram today and recommend pap on follow up visit (was referred in the past but seems she did not go)

## 2014-09-11 NOTE — Assessment & Plan Note (Addendum)
Noticed on problem list review after patient's visit. Noted need for repeat liver u/s in 03/2010 by prior provider.   On chart review: abd u/s 09/2009:  "Hepatosplenomegaly as demonstrated on prior CT. Reportedly, this is related to the patient's sarcoidosis. Several echogenic liver lesions are not demonstrated on prior CT and require further evaluation to exclude neoplasm. If the renal failure is felt to be reversible, these would be best assessed once the renal function has returned to normal. If there is persistent renal insufficiency, noncontrast abdominal MRI would be recommended. 3. Upper abdominal adenopathy, not grossly changed from prior CT and probably related to sarcoidosis. 4. Chronic cholelithiasis or milk of calcium. No significant gallbladder wall thickening"  On repeat u/s 10/2009: IMPRESSION: 1. Findings again suggestive of echogenic liver lesion in the caudate lobe, although the differential considerations include a large porta hepatis lymphadenopathy. MRI of the abdomen (with contrast if possible) again would be most helpful. 2. Splenomegaly and peripancreatic / porta hepatis lymphadenopathy, favor related to sarcoidosis. 3. Cholelithiasis.  Finally, repeat u/s 05/2011: IMPRESSION: 1. Echogenic gallbladder wall, likely representing dependently layering gallstones. Calcification of the gallbladder wall is possible as well. No mass lesion. No findings of acute cholecystitis. 2. Hepatic splenomegaly and nodular contour of the liver suggesting hepatic cirrhosis. Previously seen liver lesion is not identified on today's examination. 3. Possible small lymph node anterior to the pancreatic neck. This is nonspecific and not uncommonly seen in the setting of Cirrhosis.  She has not had abdominal imaging since 2013 and now carries diagnosis of sarcoidosis that is being followed by her specialists and was also confirmed on liver biopsy in 2009 as well. HCV  Ab positive 2009 with quant noted for no detectable level of HCV RNA at that time. MRI with contrast has been recommended in the past however has not been done and given her renal failure would not be ideal testing with possible risk of NSF as well.  She does appear to have signs of cirrhosis on imaging.  Reassuringly, the lesions seen in the past were not seen on repeat u/s in 2013 and she is being treated for her sarcoid; however, would recommend discussion with patient on follow up visit. Consider repeat Hep C and HIV check and may need further imaging and work up after discussion with patient.

## 2014-09-11 NOTE — Patient Instructions (Signed)
General Instructions: Dear Ms. Senner,  Thank you for coming in today. I am glad you are doing well.   Please continue to work on your smoking cessation! Try calling the quitline for now and let me know if you decide on wanting to use the patches.   Please continue to work on your diet and diabetes control. Bring your meter to visit next month so we can recheck your a1c. If you have any low blood sugars <70 or persistently high blood sugars >300 call and let me know  We will have the staff call you with your dermatology referral information  Please follow up with Dr. Tobey Grim job on losing weight! Keep up the good work  Please bring your medicines with you each time you come to clinic.  Medicines may include prescription medications, over-the-counter medications, herbal remedies, eye drops, vitamins, or other pills.   Progress Toward Treatment Goals:  Treatment Goal 09/11/2014  Hemoglobin A1C -  Blood pressure at goal  Stop smoking smoking the same amount    Self Care Goals & Plans:  Self Care Goal 09/11/2014  Manage my medications take my medicines as prescribed; bring my medications to every visit  Monitor my health -  Eat healthy foods eat baked foods instead of fried foods; eat foods that are low in salt  Be physically active -  Stop smoking go to the [employer] website (https://scott-booker.info/); cut down the number of cigarettes smoked    Home Blood Glucose Monitoring 09/11/2014  Check my blood sugar 3 times a day  When to check my blood sugar before meals   Care Management & Community Referrals:  Referral 08/19/2014  Referrals made for care management support none needed  Referrals made to community resources -

## 2014-09-11 NOTE — Assessment & Plan Note (Addendum)
Lab Results  Component Value Date   HGBA1C 9.7 07/28/2014   HGBA1C 8.6* 05/06/2014   HGBA1C 8.5 02/19/2014    Assessment: Diabetes control: poor control (HgbA1C >9%) Progress toward A1C goal:    Comments: recheck next month, reports improved cbgs since increasing levemir dose, mostly in 100s with occasional values in 200s. Denies any hypoglycemic episodes or low readings. CBG this morning per patient 145. Did not bring meter in today.   Plan: Medications:  continue current medications Levemir 46 units (last note by Dr. Alice Rieger says 48 units but the order was placed for 79 and she is tolerating that well), customized novolog sliding scale that she prefers, and metformin Home glucose monitoring: Frequency: 3 times a day Timing: before meals Instruction/counseling given: reminded to bring blood glucose meter & log to each visit and reminded to bring medications to each visit Educational resources provided:   Self management tools provided:   Other plans: cautioned on monitoring for hypoglycemia. Follow up next month. Asked to please bring meter on next visit. Continue to work on weight loss

## 2014-09-11 NOTE — Assessment & Plan Note (Signed)
Down to 5mg  daily of prednisone now

## 2014-09-11 NOTE — Assessment & Plan Note (Signed)
She has scheduled her eye appointment that is upcoming for this month

## 2014-09-11 NOTE — Assessment & Plan Note (Addendum)
BP Readings from Last 3 Encounters:  09/11/14 134/81  09/01/14 130/86  08/19/14 136/69   Lab Results  Component Value Date   NA 138 08/19/2014   K 4.8 08/19/2014   CREATININE 1.34* 08/19/2014   Assessment: Blood pressure control: controlled Progress toward BP goal:  at goal improved on manual repeat, 120/78 on left arm  Plan: Medications:  continue current medications torsemide 20mg  daily Other plans: bmet and mag today with complaints occasional cramping

## 2014-09-11 NOTE — Progress Notes (Signed)
Subjective:   Patient ID: Melody Gray female   DOB: 10/10/1970 44 y.o.   MRN: [id number]  HPI: [name] is a 44 y.o. female with PMH as listed below presenting to opc today for routine clinic visit.   CHF--reports improvement in DOE and reports good diuresis with torsemide with significant improvement in her lower extremity edema and weight loss.  She is due for follow up with cardiology next month.   HTN--BP well controlled on just torsemide. Her other BP medications have been held from prior hospitalizations due to hypotension.   Sarcoidosis--recently saw Dr. Melvyn Novas. Doing well on 4L [place] O2. Prednisone was decreased to 5mg  dose daily instead of alternating with 10mg  dose as she was doing before.   DM2--Levemir was increased last month to 46 units and she reports improvement in her cbg's with mostly values in the 100s and occasionally in the 200s, but no longer in the 300s and denies any low blood sugars.  Past Medical History  Diagnosis Date  . Sarcoidosis     Followed by Dr. Melvyn Novas; w/ liver involvement per biopsy 12/09, Reversible airway component so started on [facility] 01/2010; HFA 75% p coaching 05/2010  . Hypoxemia     CT angiogram 9/11>> No PE; PFTs 10/11- FEV1 1.20 (49%) with 16% better p B2, DLCO 33%> corrects to 84; O2 sats ok on 4 lpm X rapid walk X 3 laps 05/2010  . Morbid obesity     Target wt= 153 for BMI <30  . QT prolongation   . Hypertension   . Hx of cardiac cath 2/08    No CAD, no RAS,  normal EF  . Seborrheic dermatitis of scalp   . 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 Hep B serology neg. but Hep C ab +, HIV neg. 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 Cardiomyopathy, nonischemic     EF 45% 12/10; Echo 7/11 normal EF, PAS 48  . Diabetic retinopathy     Right eye 2/11  . Health maintenance examination     Mammogram 05/2010 Negative; Last Pap smear 03/2008; Last DM eye exam  2/11> mild non-proliferative diabetic retinopathy. OD  . Helicobacter pylori ab+ 05/2011    Pt was symptomatic and treatment planned for 05/2011  . CHF (congestive heart failure)   . Complication of anesthesia     " difficult waking "  . Sleep apnea   . Diabetes mellitus     insulin dependent  . Acute bronchitis 07/29/2014   Current Outpatient Prescriptions  Medication Sig Dispense Refill  . albuterol (PROVENTIL HFA;VENTOLIN HFA) 108 (90 BASE) MCG/ACT inhaler Inhale 2 puffs into the lungs every 6 (six) hours as needed for wheezing or shortness of breath. 1 Inhaler 2  . atorvastatin (LIPITOR) 20 MG tablet Take 1 tablet (20 mg total) by mouth daily at 6 PM. 90 tablet 3  . B-D INS SYRINGE 0.5CC/30GX1/2" 30G X 1/2" 0.5 ML MISC USE AS DIRECTED 100 each 1  . famotidine (PEPCID) 20 MG tablet Take 20 mg by mouth daily.  1  . fluticasone (FLONASE) 50 MCG/ACT nasal spray Place 2 sprays into both nostrils daily. 16 g 2  . insulin aspart (NOVOLOG) 100 UNIT/ML injection Take novolog per sliding scale: CBG 70-120: 0 units  CBG 121-150: 1 unit  CBG 151-200: 2 units  CBG 201-250: 3 units  CBG 251-300: 5 units  CBG 301-350: 7 units  CBG 351-400: 9 units  CBG > 400: call MD 30 mL 3  . insulin detemir (LEVEMIR) 100 UNIT/ML injection Inject 0.46 mLs (46 Units total) into the skin at bedtime. 10 mL 11  . Melatonin 3 MG TABS Take 1 tablet (3 mg total) by mouth at bedtime as needed.  0  . metFORMIN (GLUCOPHAGE) 1000 MG tablet take 1 tablet by mouth twice a day with meals 180 tablet 4  . potassium chloride SA (K-DUR,KLOR-CON) 20 MEQ tablet Take 1 tablet (20 mEq total) by mouth daily. 30 tablet 0  . predniSONE (DELTASONE) 5 MG tablet Take 1 tablet (5 mg total) by mouth every other day. 30 tablet 0  . torsemide (DEMADEX) 20 MG tablet Take 1 tablet (20 mg total) by mouth daily. 30 tablet 1  . ferrous sulfate 325 (65 FE) MG tablet Take 1 tablet (325 mg total) by mouth 3 (three) times daily with meals. (Patient not  taking: Reported on 09/01/2014) 90 tablet 3   No current facility-administered medications for this visit.   Family History  Problem Relation Age of Onset  . Cancer Mother     colon cancer  . Multiple sclerosis Father   . Asthma Sister     in childhood  . Diabetes Father   . Hypertension     History   Social History  . Marital Status: Single    Spouse Name: N/A  . Number of Children: 2  . Years of Education: N/A   Occupational History  . 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 Smoking status: Current Every Day Smoker -- 0.33 packs/day for 20 years    Types: Cigarettes  . Smokeless tobacco: Never Used     Comment: Needs to cut back.  . Alcohol Use: No  . Drug Use: No  . Sexual Activity: Not on file   Other Topics Concern  . None   Social History Narrative   Diabetic card given 05/03/2010.   Financial assistance approved for 100% discount at [facility] and has [facility] card. Melody Gray 12/07/2009.      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   Review of Systems:  Constitutional:  Denies fever, chills  HEENT:  Denies congestion  Respiratory:  Improved DOE. Chronic trach. o2 dependent  Cardiovascular:  Denies chest pain. Improved leg swelling  Gastrointestinal:  Denies nausea, vomiting, abdominal pain  Genitourinary:  Denies dysuria. +frequency with diuretics  Musculoskeletal:  Denies gait problem. Occasional cramping in hands.   Skin:  Dry, eczema coming back  Neurological:  Denies headaches or syncope   Objective:  Physical Exam: Filed Vitals:   09/11/14 0921 09/11/14 0951 09/11/14 0953  BP: 152/114 120/78 134/81  Pulse: 115  98  Temp: 98.2 F (36.8 C)    TempSrc: Oral    Weight: 237 lb 14.4 oz (107.911 kg)    SpO2: 100%     Vitals reviewed. General: sitting in chair, NAD, on 4L [place] O2 HEENT: EOMI Cardiac: RRR Pulm: clear to auscultation bilaterally, no wheezes Abd: soft, obese, nontender, BS present Ext: warm  and well perfused, trace b/l pitting edema of lower extremities, soreness to lower extremities Neuro: alert and oriented X3, strength equal in bilateral upper and lower extremities Skin: dry white peeling skin on face and dry skin of extremities  Assessment & Plan:  Discussed with Dr. Eppie Gibson

## 2014-09-11 NOTE — Assessment & Plan Note (Addendum)
Dry and scaly whitish skin with some peeling most notable on face that she says is her eczema acting up and improves with prednisone but now that dose is reduced she feels like it is coming back up again. She was not able to moisturize today but will do so and is requesting a dermatology referral which I placed today.  Check HIV next visit, too late to add on to lab work from today as lab is currently closed but HIV Ab testing was non-reactive in January of this year

## 2014-09-11 NOTE — Assessment & Plan Note (Signed)
Reports not taking iron supplements for at least one week. On review, it appears she had an MCV >100 last month, and last iron panel in 2015 was with iron 27 and ferritin 16.   Recheck cbc and iron panel today--MCV 90s and Hb slightly improved to 10.9 (baseline variable but has been usually 10-11 but increased earlier this year) Iron panel: 71 and ferritin 72; thus she could consider continuing to hold her iron supplements; will need to discuss with patient Folate and vitamin b12 level pending

## 2014-09-11 NOTE — Assessment & Plan Note (Signed)
She has lost 7 pounds since last office visit and reports good diuresis with current torsemide dose.   Congratulated on weight loss and encouraged continued weight loss and try to measure daily weights

## 2014-09-11 NOTE — Assessment & Plan Note (Signed)
Follow up appt with cardiology scheduled for June. Continue torsemide for now

## 2014-09-13 NOTE — Addendum Note (Signed)
Addended by: Wilber Oliphant on: 09/13/2014 02:37 AM   Modules accepted: Orders

## 2014-09-14 LAB — FOLATE RBC
Folate, Hemolysate: 383.3 ng/mL
Folate, RBC: 1114 ng/mL (ref >498)
Hematocrit: 34.4 % (ref 34.0–46.6)

## 2014-09-15 ENCOUNTER — Telehealth: Payer: Self-pay | Admitting: Internal Medicine

## 2014-09-15 DIAGNOSIS — E1165 Type 2 diabetes mellitus with hyperglycemia: Secondary | ICD-10-CM | POA: Diagnosis not present

## 2014-09-15 DIAGNOSIS — I5033 Acute on chronic diastolic (congestive) heart failure: Secondary | ICD-10-CM | POA: Diagnosis not present

## 2014-09-15 DIAGNOSIS — I272 Other secondary pulmonary hypertension: Secondary | ICD-10-CM | POA: Diagnosis not present

## 2014-09-15 DIAGNOSIS — Z430 Encounter for attention to tracheostomy: Secondary | ICD-10-CM | POA: Diagnosis not present

## 2014-09-15 DIAGNOSIS — E11319 Type 2 diabetes mellitus with unspecified diabetic retinopathy without macular edema: Secondary | ICD-10-CM | POA: Diagnosis not present

## 2014-09-15 DIAGNOSIS — D860 Sarcoidosis of lung: Secondary | ICD-10-CM | POA: Diagnosis not present

## 2014-09-15 MED ORDER — POTASSIUM CHLORIDE CRYS ER 20 MEQ PO TBCR: 20.0000 meq | EXTENDED_RELEASE_TABLET | Freq: Every day | ORAL | Status: DC

## 2014-09-15 MED ORDER — MAGNESIUM OXIDE -MG SUPPLEMENT 200 MG PO TABS: 200.0000 mg | ORAL_TABLET | Freq: Two times a day (BID) | ORAL | Status: DC

## 2014-09-15 NOTE — Telephone Encounter (Signed)
-----   Message from Forde Dandy, [facility] sent at 09/15/2014  5:20 PM EDT ----- Regarding: RE: mag supplementation Hi Dr. Barnetta Chapel.  That is correct, you can go with mag oxide 400 mg 2 tablets daily. Ideally, treat for a few days (3-5) then re-check and continue mag treatment for 1-2 days if normal.  ----- Message -----    From: Wilber Oliphant, MD    Sent: 09/15/2014  10:41 AM      To: Forde Dandy, RPH Subject: mag supplementation                            Hi dr Maudie Mercury,  Can you please advise best way to supplement Ms. Duby's mag from her latest check. She does have renal failure. I think we discussed mag ox for those patients? Can you advise on dose and when to recheck?  Thanks,  Dr q

## 2014-09-15 NOTE — Assessment & Plan Note (Addendum)
Addendum 09/15/14: Mag checked during office visit was low at 1.2, likely in setting of diuresis. Her cramping is improving per discussion on the phone. Given renal failure, we discussed careful supplementation and will try Mag ox 200mg  po bid for the rest of the week and patient is to return Friday if possible but she says likely she can return on Monday 5/22 for repeat lab work.

## 2014-09-15 NOTE — Telephone Encounter (Signed)
I called and reviewed lab results with Ms. Dipierro this evening. Her Magnesium level was low at 1.2 and she initially complained of cramping which she says has improved. We discussed supplementation of her Mag with caution for her renal failure. I also discussed dosing wit pharmacy. As such, will proceed with low dose supplementation for now 200mg  bid of Mag ox for the rest of the week and recheck level on Monday (patient cannot come to clinic on Friday). She is also out of refills for her potassium 19meq that she takes with her torsemide and I have put in for refills for that. I informed her of possible diarrhea from the Magnesium supplements and advised her to let us know if she experiences any diarrhea or worsening of her symptoms. Additionally, we reviewed her iron panel which from last visit with Iron improved to 71 and Ferritin of 72. She has not been taking any iron supplements for at least 2 months apparently. As such, she elected to continue to hold for now and we will re-evaluate on follow up visit. I will let the front desk know she will need to return on Friday or Monday (likely Monday per patient but if she can get here before she will try) for follow up labs, will check bmet for K and check Mag as well.

## 2014-09-16 DIAGNOSIS — D860 Sarcoidosis of lung: Secondary | ICD-10-CM | POA: Diagnosis not present

## 2014-09-16 DIAGNOSIS — I5033 Acute on chronic diastolic (congestive) heart failure: Secondary | ICD-10-CM | POA: Diagnosis not present

## 2014-09-16 DIAGNOSIS — E1165 Type 2 diabetes mellitus with hyperglycemia: Secondary | ICD-10-CM | POA: Diagnosis not present

## 2014-09-16 DIAGNOSIS — E11319 Type 2 diabetes mellitus with unspecified diabetic retinopathy without macular edema: Secondary | ICD-10-CM | POA: Diagnosis not present

## 2014-09-16 DIAGNOSIS — Z430 Encounter for attention to tracheostomy: Secondary | ICD-10-CM | POA: Diagnosis not present

## 2014-09-16 DIAGNOSIS — I272 Other secondary pulmonary hypertension: Secondary | ICD-10-CM | POA: Diagnosis not present

## 2014-09-16 NOTE — Progress Notes (Signed)
Case discussed with Dr. Eula Fried soon after the resident saw the patient.  We reviewed the resident's history and exam and pertinent patient test results.  I agree with the assessment, diagnosis and plan of care documented in the resident's note with the exception of the need for repeat HCV and HIV testing.  HCV RNA previously negative, so does not require a repeat test at this time.  HIV test negative within last 5 months, so repeat test not indicated at this time with lack of risk factors in interim.Marland Kitchen

## 2014-09-17 ENCOUNTER — Other Ambulatory Visit: Payer: Self-pay

## 2014-09-17 DIAGNOSIS — I272 Other secondary pulmonary hypertension: Secondary | ICD-10-CM | POA: Diagnosis not present

## 2014-09-17 DIAGNOSIS — I5033 Acute on chronic diastolic (congestive) heart failure: Secondary | ICD-10-CM | POA: Diagnosis not present

## 2014-09-17 DIAGNOSIS — Z430 Encounter for attention to tracheostomy: Secondary | ICD-10-CM | POA: Diagnosis not present

## 2014-09-17 DIAGNOSIS — D860 Sarcoidosis of lung: Secondary | ICD-10-CM | POA: Diagnosis not present

## 2014-09-17 DIAGNOSIS — E11319 Type 2 diabetes mellitus with unspecified diabetic retinopathy without macular edema: Secondary | ICD-10-CM | POA: Diagnosis not present

## 2014-09-17 DIAGNOSIS — E1165 Type 2 diabetes mellitus with hyperglycemia: Secondary | ICD-10-CM | POA: Diagnosis not present

## 2014-09-18 DIAGNOSIS — Z430 Encounter for attention to tracheostomy: Secondary | ICD-10-CM | POA: Diagnosis not present

## 2014-09-18 DIAGNOSIS — E1165 Type 2 diabetes mellitus with hyperglycemia: Secondary | ICD-10-CM | POA: Diagnosis not present

## 2014-09-18 DIAGNOSIS — I272 Other secondary pulmonary hypertension: Secondary | ICD-10-CM | POA: Diagnosis not present

## 2014-09-18 DIAGNOSIS — E11319 Type 2 diabetes mellitus with unspecified diabetic retinopathy without macular edema: Secondary | ICD-10-CM | POA: Diagnosis not present

## 2014-09-18 DIAGNOSIS — D860 Sarcoidosis of lung: Secondary | ICD-10-CM | POA: Diagnosis not present

## 2014-09-18 DIAGNOSIS — I5033 Acute on chronic diastolic (congestive) heart failure: Secondary | ICD-10-CM | POA: Diagnosis not present

## 2014-09-22 DIAGNOSIS — D860 Sarcoidosis of lung: Secondary | ICD-10-CM | POA: Diagnosis not present

## 2014-09-22 DIAGNOSIS — I5033 Acute on chronic diastolic (congestive) heart failure: Secondary | ICD-10-CM | POA: Diagnosis not present

## 2014-09-22 DIAGNOSIS — I272 Other secondary pulmonary hypertension: Secondary | ICD-10-CM | POA: Diagnosis not present

## 2014-09-22 DIAGNOSIS — Z430 Encounter for attention to tracheostomy: Secondary | ICD-10-CM | POA: Diagnosis not present

## 2014-09-22 DIAGNOSIS — E11319 Type 2 diabetes mellitus with unspecified diabetic retinopathy without macular edema: Secondary | ICD-10-CM | POA: Diagnosis not present

## 2014-09-22 DIAGNOSIS — E1165 Type 2 diabetes mellitus with hyperglycemia: Secondary | ICD-10-CM | POA: Diagnosis not present

## 2014-09-24 DIAGNOSIS — D860 Sarcoidosis of lung: Secondary | ICD-10-CM | POA: Diagnosis not present

## 2014-09-24 DIAGNOSIS — I5033 Acute on chronic diastolic (congestive) heart failure: Secondary | ICD-10-CM | POA: Diagnosis not present

## 2014-09-24 DIAGNOSIS — Z430 Encounter for attention to tracheostomy: Secondary | ICD-10-CM | POA: Diagnosis not present

## 2014-09-24 DIAGNOSIS — E11319 Type 2 diabetes mellitus with unspecified diabetic retinopathy without macular edema: Secondary | ICD-10-CM | POA: Diagnosis not present

## 2014-09-24 DIAGNOSIS — I272 Other secondary pulmonary hypertension: Secondary | ICD-10-CM | POA: Diagnosis not present

## 2014-09-24 DIAGNOSIS — E1165 Type 2 diabetes mellitus with hyperglycemia: Secondary | ICD-10-CM | POA: Diagnosis not present

## 2014-09-25 ENCOUNTER — Other Ambulatory Visit (INDEPENDENT_AMBULATORY_CARE_PROVIDER_SITE_OTHER): Payer: Medicare Other

## 2014-09-25 DIAGNOSIS — E11329 Type 2 diabetes mellitus with mild nonproliferative diabetic retinopathy without macular edema: Secondary | ICD-10-CM | POA: Diagnosis not present

## 2014-09-25 DIAGNOSIS — H35033 Hypertensive retinopathy, bilateral: Secondary | ICD-10-CM | POA: Diagnosis not present

## 2014-09-26 LAB — BASIC METABOLIC PANEL WITH GFR
BUN: 25 mg/dL — ABNORMAL HIGH (ref 6–23)
CO2: 25 mEq/L (ref 19–32)
Calcium: 9.4 mg/dL (ref 8.4–10.5)
Chloride: 95 mEq/L — ABNORMAL LOW (ref 96–112)
Creat: 1.2 mg/dL — ABNORMAL HIGH (ref 0.50–1.10)
GFR, Est African American: 63 mL/min
GFR, Est Non African American: 55 mL/min — ABNORMAL LOW
Glucose, Bld: 296 mg/dL — ABNORMAL HIGH (ref 70–99)
Potassium: 4.7 mEq/L (ref 3.5–5.3)
Sodium: 138 mEq/L (ref 135–145)

## 2014-09-26 LAB — MAGNESIUM: Magnesium: 1.2 mg/dL — ABNORMAL LOW (ref 1.5–2.5)

## 2014-09-29 ENCOUNTER — Telehealth: Payer: Self-pay | Admitting: Internal Medicine

## 2014-09-29 ENCOUNTER — Telehealth: Payer: Self-pay | Admitting: Pharmacist

## 2014-09-29 DIAGNOSIS — E11319 Type 2 diabetes mellitus with unspecified diabetic retinopathy without macular edema: Secondary | ICD-10-CM | POA: Diagnosis not present

## 2014-09-29 DIAGNOSIS — D860 Sarcoidosis of lung: Secondary | ICD-10-CM | POA: Diagnosis not present

## 2014-09-29 DIAGNOSIS — E1165 Type 2 diabetes mellitus with hyperglycemia: Secondary | ICD-10-CM | POA: Diagnosis not present

## 2014-09-29 DIAGNOSIS — Z430 Encounter for attention to tracheostomy: Secondary | ICD-10-CM | POA: Diagnosis not present

## 2014-09-29 DIAGNOSIS — I5033 Acute on chronic diastolic (congestive) heart failure: Secondary | ICD-10-CM | POA: Diagnosis not present

## 2014-09-29 DIAGNOSIS — I272 Other secondary pulmonary hypertension: Secondary | ICD-10-CM | POA: Diagnosis not present

## 2014-09-29 NOTE — Telephone Encounter (Signed)
Spoke with patient she did complete Magnesium as previously prescribed.  She will be available to speak to Dr. Eula Fried between 7-8 pm tonight about Magnesium. She did take Magnesium previously and states legs cramps have resovled.    Aundra Dubin MD

## 2014-09-29 NOTE — Telephone Encounter (Signed)
I called Ms. Campanella this morning to try to discuss her magnesium level and supplementation, however no answer and I have a left a message. I am still on nights and unable to get in touch with her but also messaged Dr. Maudie Mercury from pharmacy to assist with supplementation recommendations. I will route this note to her and glenda in case Ms. Metzer is able to call back. I also discussed with Dr. Aundra Dubin in case she calls the on call pager to find out if she did take the magnesium that was ordered previously and how she tolerated it and if she is still having more cramping.

## 2014-09-29 NOTE — Telephone Encounter (Signed)
Collaborating with PCP for magnesium supplement dosing. Patient states she was able to pick up and complete the 200 mg BID (x 5 days) regimen.

## 2014-09-30 ENCOUNTER — Telehealth: Payer: Self-pay | Admitting: Pharmacist

## 2014-09-30 ENCOUNTER — Telehealth: Payer: Self-pay | Admitting: Internal Medicine

## 2014-09-30 ENCOUNTER — Other Ambulatory Visit: Payer: Self-pay | Admitting: Internal Medicine

## 2014-09-30 DIAGNOSIS — Z430 Encounter for attention to tracheostomy: Secondary | ICD-10-CM | POA: Diagnosis not present

## 2014-09-30 DIAGNOSIS — I272 Other secondary pulmonary hypertension: Secondary | ICD-10-CM | POA: Diagnosis not present

## 2014-09-30 DIAGNOSIS — I5033 Acute on chronic diastolic (congestive) heart failure: Secondary | ICD-10-CM | POA: Diagnosis not present

## 2014-09-30 DIAGNOSIS — E11319 Type 2 diabetes mellitus with unspecified diabetic retinopathy without macular edema: Secondary | ICD-10-CM | POA: Diagnosis not present

## 2014-09-30 DIAGNOSIS — E1165 Type 2 diabetes mellitus with hyperglycemia: Secondary | ICD-10-CM | POA: Diagnosis not present

## 2014-09-30 DIAGNOSIS — D860 Sarcoidosis of lung: Secondary | ICD-10-CM | POA: Diagnosis not present

## 2014-09-30 MED ORDER — MAGNESIUM OXIDE 400 MG PO TABS: 400.0000 mg | ORAL_TABLET | Freq: Two times a day (BID) | ORAL | Status: DC

## 2014-09-30 NOTE — Telephone Encounter (Signed)
Working with PCP on magnesium dosing. Patient education provided, and patient verbalized understanding of information by repeating back.

## 2014-09-30 NOTE — Telephone Encounter (Signed)
I tried calling Melody Gray again this morning in regards to her mag supplementation. Someone else had her phone and said they will be back at home shortly and will have her call the clinic back. I have sent a note to pharmacy and glenda incase she calls back and also notified bridge resident for her to restart mag supplementation that was sent to the pharmacy yesterday.

## 2014-10-01 ENCOUNTER — Encounter: Payer: Self-pay | Admitting: Physician Assistant

## 2014-10-01 ENCOUNTER — Encounter: Payer: Medicare Other | Admitting: Physician Assistant

## 2014-10-01 ENCOUNTER — Telehealth: Payer: Self-pay | Admitting: Internal Medicine

## 2014-10-01 DIAGNOSIS — D649 Anemia, unspecified: Secondary | ICD-10-CM | POA: Insufficient documentation

## 2014-10-01 DIAGNOSIS — N179 Acute kidney failure, unspecified: Secondary | ICD-10-CM | POA: Insufficient documentation

## 2014-10-01 DIAGNOSIS — N184 Chronic kidney disease, stage 4 (severe): Secondary | ICD-10-CM

## 2014-10-01 DIAGNOSIS — I428 Other cardiomyopathies: Secondary | ICD-10-CM | POA: Insufficient documentation

## 2014-10-01 DIAGNOSIS — F17210 Nicotine dependence, cigarettes, uncomplicated: Secondary | ICD-10-CM | POA: Insufficient documentation

## 2014-10-01 DIAGNOSIS — E662 Morbid (severe) obesity with alveolar hypoventilation: Secondary | ICD-10-CM | POA: Insufficient documentation

## 2014-10-01 DIAGNOSIS — E876 Hypokalemia: Secondary | ICD-10-CM | POA: Insufficient documentation

## 2014-10-01 DIAGNOSIS — I5032 Chronic diastolic (congestive) heart failure: Secondary | ICD-10-CM | POA: Insufficient documentation

## 2014-10-01 NOTE — Progress Notes (Signed)
This encounter was created in error - please disregard.

## 2014-10-01 NOTE — Progress Notes (Deleted)
Cardiology Office Note Date:  10/01/2014  Patient ID:  Melody Gray, Melody Gray [birth year], MRN [id number] PCP:  Jerene Pitch, MD  Cardiologist: Dr. Stanford Breed  ***refresh   Chief Complaint: follow-up of CHF  History of Present Illness: Melody Gray is a 44 y.o. female with complex history of DM, chronic CHF with varying EF over the years, pulmonary hypertension, NICM, HTN, obesity, anemia in chronic renal disease, hypomagesemia, sarcoidosis with liver involvement, chronic steroid use, QT prolongation, sleep apnea who presents for follow-up. She became O2 dependent in July 2011. She required trache placement in 07/2013 and has been followed by pulmonology as well. She was evaluated in 2008 for transient QTC prolongation with cath showing low-normal LV function, normal coronaries and normal aortoiliac system without evidence for renal artery stenosis. Dr. Stanford Breed saw her in 10/2010 for evaluation of abnormal echocardiogram. Echo 09/2010: EF 35% which was new, mild biatrial enlargement, mild right ventricular enlargement and mildly reduced RV function. Cardiac MRI in 10/2010 showed moderate to severe LV systolic dysfunction, EF 123456, anterior and anterolateral walls appeared severely hypokinetic, mild RV dilation with mild systolic dysfunction, possible small areas of mid wall delayed enhancement in the apical septal and apical lateral walls. (These areas were very faint and not definitely of clinical significance. Mid wall enhancement can be found in infiltrative cardiomyopathies such as that due to sarcoidosis.) TSH and ferritin normal. Cardiac catheterization WAS repeated in 12/2010 and showed an ejection fraction of 40% and diffuse HK. There was no coronary disease. Echo 09/2013 showed normal LV function, grade 2 DD, mild LAE/RAE, mild RVE with reduced function. Lower ext dopplers 8/15 showed no DVT.   Most recently she was admitted in 07/2014 with acute on chronic diastolic CHF/RHF. It was felt that her  sarcoidosis, obstructive sleep apnea, and morbid obesity hypoventilation syndrome were contributing to her pulmonary hypertension/right heart failure. Most recent echo 08/10/2014: EF 55-60%, no RWMA, + diastolic flattening and systolic flattening c/w RV volume and pressure overload, mild LAE/RAE, mod dilated RV, mod TR, PASP severely increased at 15mmHg. [place] 07/2014 showed moderate pulmonary HTN likely WHO group 3 with markedly elevated CVP and relatively normal left sided pressures. Dr. Haroldine Laws recommended oral torsemide with caution towards renal function, supplemental O2 and weight los. He did not recommend to start on selective pulmonary vasodilators at this point.  Diuresis was complicated by acute on chronic renal insufficiency. BP was also borderline so BP meds were cut back. Dr. Stanford Breed recommended to DC on demadex 20 mg daily; take additional 20 daily for weight gain of 2-3 lbs. D/C weight 246lb. She's been followed by primary care since follow-up. She's had issues with low Mg.    Past Medical History  Diagnosis Date  . Sarcoidosis     Followed by Dr. Melvyn Novas; w/ liver involvement per biopsy 12/09, Reversible airway component so started on [facility] 01/2010; HFA 75% p coaching 05/2010  . Hypoxemia     CT angiogram 9/11>> No PE; PFTs 10/11- FEV1 1.20 (49%) with 16% better p B2, DLCO 33%> corrects to 84; O2 sats ok on 4 lpm X rapid walk X 3 laps 05/2010  . Morbid obesity     Target wt= 153 for BMI <30  . QT prolongation   . Essential hypertension   . Hx of cardiac cath 2/08    No CAD, no RAS,  normal EF  . Seborrheic dermatitis of scalp   . Abnormal LFTs (liver function tests)     Liver U/S and exam  c/w HSM. Hep B serology neg. but Hep C ab +, HIV neg. 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 Cardiomyopathy, nonischemic     a. Varying EF over the years - initially 35% in 09/2010. Normal cors 12/2010. b. Echo 07/2014: EF 55-60%, no RWMA, + diastolic  flattening and systolic flattening c/w RV volume and pressure overload, mild LAE/RAE, mod dilated RV, mod TR, PASP severely increased at 54mmHg. c. RHC 07/2014: moderate pulmonary HTN likely WHO group 3 with markedly elevated CVP and relatively normal left sided pressures.  . Diabetic retinopathy     Right eye 2/11  . Health maintenance examination     Mammogram 05/2010 Negative; Last Pap smear 03/2008; Last DM eye exam 2/11> mild non-proliferative diabetic retinopathy. OD  . Helicobacter pylori ab+ 05/2011    Pt was symptomatic and treatment planned for 05/2011  . Chronic diastolic CHF (congestive heart failure)   . Complication of anesthesia     " difficult waking "  . Sleep apnea   . Diabetes mellitus     insulin dependent  . Acute bronchitis 07/29/2014  . Pulmonary hypertension   . Anemia   . CKD (chronic kidney disease), stage II   . Hypomagnesemia   . Long term current use of systemic steroids   . Obesity hypoventilation syndrome   . Chronic respiratory failure     a. became O2 dependent in July 2011. b. She required trache placement in 07/2013 and has been followed by pulmonology as well.  . Tobacco abuse   . Hypokalemia     Past Surgical History  Procedure Laterality Date  . Tubal ligation    . Breast surgery    . Cesarean section    . Tracheostomy tube placement N/A 08/10/2013    Procedure: TRACHEOSTOMY;  Surgeon: Melissa Montane, MD;  Location: [facility];  Service: ENT;  Laterality: N/A;  . Right heart catheterization N/A 08/12/2014    Procedure: RIGHT HEART CATH;  Surgeon: Jolaine Artist, MD;  Location: [facility] CATH LAB;  Service: Cardiovascular;  Laterality: N/A;    Current Outpatient Prescriptions  Medication Sig Dispense Refill  . albuterol (PROVENTIL HFA;VENTOLIN HFA) 108 (90 BASE) MCG/ACT inhaler Inhale 2 puffs into the lungs every 6 (six) hours as needed for wheezing or shortness of breath. 1 Inhaler 2  . atorvastatin (LIPITOR) 20 MG tablet Take 1 tablet (20 mg total) by mouth  daily at 6 PM. 90 tablet 3  . B-D INS SYRINGE 0.5CC/30GX1/2" 30G X 1/2" 0.5 ML MISC USE AS DIRECTED 100 each 1  . famotidine (PEPCID) 20 MG tablet Take 20 mg by mouth daily.  1  . ferrous sulfate 325 (65 FE) MG tablet Take 1 tablet (325 mg total) by mouth 3 (three) times daily with meals. (Patient not taking: Reported on 09/01/2014) 90 tablet 3  . fluticasone (FLONASE) 50 MCG/ACT nasal spray Place 2 sprays into both nostrils daily. 16 g 2  . glucose blood test strip 1 each by Other route as needed for other. Use as instructed    . insulin aspart (NOVOLOG) 100 UNIT/ML injection Take novolog per sliding scale: CBG 70-120: 0 units  CBG 121-150: 1 unit  CBG 151-200: 2 units  CBG 201-250: 3 units  CBG 251-300: 5 units  CBG 301-350: 7 units  CBG 351-400: 9 units  CBG > 400: call MD 30 mL 3  . insulin detemir (LEVEMIR) 100 UNIT/ML injection Inject 0.46 mLs (46  Units total) into the skin at bedtime. 10 mL 11  . magnesium oxide (MAG-OX) 400 MG tablet Take 1 tablet (400 mg total) by mouth 2 (two) times daily. 14 tablet 0  . Melatonin 3 MG TABS Take 1 tablet (3 mg total) by mouth at bedtime as needed.  0  . metFORMIN (GLUCOPHAGE) 1000 MG tablet take 1 tablet by mouth twice a day with meals 180 tablet 4  . potassium chloride SA (K-DUR,KLOR-CON) 20 MEQ tablet Take 1 tablet (20 mEq total) by mouth daily. 30 tablet 0  . predniSONE (DELTASONE) 5 MG tablet Take 1 tablet (5 mg total) by mouth every other day. 30 tablet 0  . torsemide (DEMADEX) 20 MG tablet Take 1 tablet (20 mg total) by mouth daily. 30 tablet 1   No current facility-administered medications for this visit.    Allergies:   Vicodin   Social History:  The patient  reports that she has been smoking Cigarettes.  She has a 6.6 pack-year smoking history. She has never used smokeless tobacco. She reports that she does not drink alcohol or use illicit drugs.   Family History:  The patient's family history includes Asthma in her sister; Cancer in  her mother; Diabetes in her father; Hypertension in an other family member; Multiple sclerosis in her father.***  ROS:  Please see the history of present illness. Otherwise, review of systems is positive for ***.   All other systems are reviewed and otherwise negative.   PHYSICAL EXAM: *** VS:  There were no vitals taken for this visit. BMI: There is no weight on file to calculate BMI. Well nourished, well developed, in no acute distress HEENT: normocephalic, atraumatic Neck: no JVD, carotid bruits or masses Cardiac:  normal S1, S2; RRR; no murmurs, rubs, or gallops Lungs:  clear to auscultation bilaterally, no wheezing, rhonchi or rales Abd: soft, nontender, no hepatomegaly, + BS MS: no deformity or atrophy Ext: no edema Skin: warm and dry, no rash Neuro:  moves all extremities spontaneously, no focal abnormalities noted, follows commands Psych: euthymic mood, full affect   EKG:  Done today shows ***  Recent Labs: 03/27/2014: Pro B Natriuretic peptide (BNP) 2262.0* 08/08/2014: ALT 15; B Natriuretic Peptide 853.4* 09/11/2014: Hemoglobin 10.9*; Platelets 273 09/25/2014: BUN 25*; Creatinine 1.20*; Magnesium 1.2*; Potassium 4.7; Sodium 138  01/02/2014: Cholesterol, Total 238*; HDL-C 70; LDL (calc) 118*; Total CHOL/HDL Ratio 3.4; Triglycerides 252*; VLDL 50*   CrCl cannot be calculated (Unknown ideal weight.).   Wt Readings from Last 3 Encounters:  09/11/14 237 lb 14.4 oz (107.911 kg)  09/01/14 244 lb 3.2 oz (110.768 kg)  08/19/14 245 lb 9.6 oz (111.403 kg)     Other studies reviewed: Additional studies/records reviewed today include: summarized above***  ASSESSMENT AND PLAN:  1. Chronic diastolic CHF with history of NICM 2. Pulmonary hypertension associated with sarcoidosis, OHS and OSA 3. Chronic respiratory failure with tracheostomy use 4. CKD stage II-III 5. History of prolonged QT on EKG  Mg level Compare weights Counsel re OSA, weight QTC  Disposition: F/u with  ***  Current medicines are reviewed at length with the patient today.  The patient did not have any concerns regarding medicines.***  Signed, Melina Copa PA-C 10/01/2014 8:12 AM     [facility] [address] [place] [address] [phone] (office)  [phone] (fax)

## 2014-10-01 NOTE — Telephone Encounter (Signed)
Called ms Schack this morning again to discuss medications and recent lab. No answer, left voicemail

## 2014-10-02 DIAGNOSIS — D860 Sarcoidosis of lung: Secondary | ICD-10-CM | POA: Diagnosis not present

## 2014-10-02 DIAGNOSIS — E11319 Type 2 diabetes mellitus with unspecified diabetic retinopathy without macular edema: Secondary | ICD-10-CM | POA: Diagnosis not present

## 2014-10-02 DIAGNOSIS — I272 Other secondary pulmonary hypertension: Secondary | ICD-10-CM | POA: Diagnosis not present

## 2014-10-02 DIAGNOSIS — Z430 Encounter for attention to tracheostomy: Secondary | ICD-10-CM | POA: Diagnosis not present

## 2014-10-02 DIAGNOSIS — I5033 Acute on chronic diastolic (congestive) heart failure: Secondary | ICD-10-CM | POA: Diagnosis not present

## 2014-10-02 DIAGNOSIS — E1165 Type 2 diabetes mellitus with hyperglycemia: Secondary | ICD-10-CM | POA: Diagnosis not present

## 2014-10-06 DIAGNOSIS — E1165 Type 2 diabetes mellitus with hyperglycemia: Secondary | ICD-10-CM | POA: Diagnosis not present

## 2014-10-06 DIAGNOSIS — I272 Other secondary pulmonary hypertension: Secondary | ICD-10-CM | POA: Diagnosis not present

## 2014-10-06 DIAGNOSIS — E11319 Type 2 diabetes mellitus with unspecified diabetic retinopathy without macular edema: Secondary | ICD-10-CM | POA: Diagnosis not present

## 2014-10-06 DIAGNOSIS — Z430 Encounter for attention to tracheostomy: Secondary | ICD-10-CM | POA: Diagnosis not present

## 2014-10-06 DIAGNOSIS — D860 Sarcoidosis of lung: Secondary | ICD-10-CM | POA: Diagnosis not present

## 2014-10-06 DIAGNOSIS — I5033 Acute on chronic diastolic (congestive) heart failure: Secondary | ICD-10-CM | POA: Diagnosis not present

## 2014-10-08 DIAGNOSIS — D860 Sarcoidosis of lung: Secondary | ICD-10-CM | POA: Diagnosis not present

## 2014-10-08 DIAGNOSIS — Z430 Encounter for attention to tracheostomy: Secondary | ICD-10-CM | POA: Diagnosis not present

## 2014-10-08 DIAGNOSIS — E1165 Type 2 diabetes mellitus with hyperglycemia: Secondary | ICD-10-CM | POA: Diagnosis not present

## 2014-10-08 DIAGNOSIS — E11319 Type 2 diabetes mellitus with unspecified diabetic retinopathy without macular edema: Secondary | ICD-10-CM | POA: Diagnosis not present

## 2014-10-08 DIAGNOSIS — I5033 Acute on chronic diastolic (congestive) heart failure: Secondary | ICD-10-CM | POA: Diagnosis not present

## 2014-10-08 DIAGNOSIS — I272 Other secondary pulmonary hypertension: Secondary | ICD-10-CM | POA: Diagnosis not present

## 2014-10-12 DIAGNOSIS — I272 Other secondary pulmonary hypertension: Secondary | ICD-10-CM | POA: Diagnosis not present

## 2014-10-12 DIAGNOSIS — D860 Sarcoidosis of lung: Secondary | ICD-10-CM | POA: Diagnosis not present

## 2014-10-12 DIAGNOSIS — E11319 Type 2 diabetes mellitus with unspecified diabetic retinopathy without macular edema: Secondary | ICD-10-CM | POA: Diagnosis not present

## 2014-10-12 DIAGNOSIS — Z430 Encounter for attention to tracheostomy: Secondary | ICD-10-CM | POA: Diagnosis not present

## 2014-10-12 DIAGNOSIS — I5033 Acute on chronic diastolic (congestive) heart failure: Secondary | ICD-10-CM | POA: Diagnosis not present

## 2014-10-12 DIAGNOSIS — E1165 Type 2 diabetes mellitus with hyperglycemia: Secondary | ICD-10-CM | POA: Diagnosis not present

## 2014-10-13 ENCOUNTER — Ambulatory Visit (INDEPENDENT_AMBULATORY_CARE_PROVIDER_SITE_OTHER): Payer: Medicare Other | Admitting: Internal Medicine

## 2014-10-13 ENCOUNTER — Encounter: Payer: Self-pay | Admitting: Internal Medicine

## 2014-10-13 VITALS — BP 134/80 | HR 101 | Temp 98.3°F | Wt 231.1 lb

## 2014-10-13 DIAGNOSIS — E1165 Type 2 diabetes mellitus with hyperglycemia: Secondary | ICD-10-CM

## 2014-10-13 DIAGNOSIS — Z6841 Body Mass Index (BMI) 40.0 and over, adult: Secondary | ICD-10-CM

## 2014-10-13 DIAGNOSIS — I272 Other secondary pulmonary hypertension: Secondary | ICD-10-CM | POA: Diagnosis not present

## 2014-10-13 DIAGNOSIS — Z794 Long term (current) use of insulin: Secondary | ICD-10-CM

## 2014-10-13 DIAGNOSIS — Z430 Encounter for attention to tracheostomy: Secondary | ICD-10-CM | POA: Diagnosis not present

## 2014-10-13 DIAGNOSIS — L853 Xerosis cutis: Secondary | ICD-10-CM | POA: Diagnosis not present

## 2014-10-13 DIAGNOSIS — I5032 Chronic diastolic (congestive) heart failure: Secondary | ICD-10-CM

## 2014-10-13 DIAGNOSIS — Z720 Tobacco use: Secondary | ICD-10-CM

## 2014-10-13 DIAGNOSIS — F17201 Nicotine dependence, unspecified, in remission: Secondary | ICD-10-CM | POA: Diagnosis not present

## 2014-10-13 DIAGNOSIS — E113299 Type 2 diabetes mellitus with mild nonproliferative diabetic retinopathy without macular edema, unspecified eye: Secondary | ICD-10-CM

## 2014-10-13 DIAGNOSIS — Z7952 Long term (current) use of systemic steroids: Secondary | ICD-10-CM | POA: Diagnosis not present

## 2014-10-13 DIAGNOSIS — E1122 Type 2 diabetes mellitus with diabetic chronic kidney disease: Secondary | ICD-10-CM

## 2014-10-13 DIAGNOSIS — E11329 Type 2 diabetes mellitus with mild nonproliferative diabetic retinopathy without macular edema: Secondary | ICD-10-CM

## 2014-10-13 DIAGNOSIS — IMO0002 Reserved for concepts with insufficient information to code with codable children: Secondary | ICD-10-CM

## 2014-10-13 DIAGNOSIS — I129 Hypertensive chronic kidney disease with stage 1 through stage 4 chronic kidney disease, or unspecified chronic kidney disease: Secondary | ICD-10-CM

## 2014-10-13 DIAGNOSIS — I10 Essential (primary) hypertension: Secondary | ICD-10-CM

## 2014-10-13 DIAGNOSIS — N189 Chronic kidney disease, unspecified: Secondary | ICD-10-CM | POA: Diagnosis not present

## 2014-10-13 DIAGNOSIS — D631 Anemia in chronic kidney disease: Secondary | ICD-10-CM | POA: Diagnosis not present

## 2014-10-13 DIAGNOSIS — D860 Sarcoidosis of lung: Secondary | ICD-10-CM | POA: Diagnosis not present

## 2014-10-13 DIAGNOSIS — Z0000 Encounter for general adult medical examination without abnormal findings: Secondary | ICD-10-CM

## 2014-10-13 DIAGNOSIS — E11319 Type 2 diabetes mellitus with unspecified diabetic retinopathy without macular edema: Secondary | ICD-10-CM | POA: Diagnosis not present

## 2014-10-13 DIAGNOSIS — K769 Liver disease, unspecified: Secondary | ICD-10-CM | POA: Diagnosis not present

## 2014-10-13 DIAGNOSIS — I5033 Acute on chronic diastolic (congestive) heart failure: Secondary | ICD-10-CM | POA: Diagnosis not present

## 2014-10-13 LAB — GLUCOSE, CAPILLARY
Glucose-Capillary: 226 mg/dL — ABNORMAL HIGH (ref 65–99)
Glucose-Capillary: 232 mg/dL — ABNORMAL HIGH (ref 65–99)

## 2014-10-13 LAB — POCT GLYCOSYLATED HEMOGLOBIN (HGB A1C): Hemoglobin A1C: 8.2

## 2014-10-13 LAB — POCT URINE PREGNANCY: Preg Test, Ur: NEGATIVE

## 2014-10-13 LAB — MAGNESIUM: Magnesium: 1.3 mg/dL — ABNORMAL LOW (ref 1.5–2.5)

## 2014-10-13 MED ORDER — PREDNISONE 5 MG PO TABS: 5.0000 mg | ORAL_TABLET | ORAL | Status: DC

## 2014-10-13 MED ORDER — INSULIN ASPART 100 UNIT/ML ~~LOC~~ SOLN: SUBCUTANEOUS | Status: DC

## 2014-10-13 MED ORDER — FAMOTIDINE 20 MG PO TABS: 20.0000 mg | ORAL_TABLET | Freq: Every day | ORAL | Status: DC

## 2014-10-13 MED ORDER — TORSEMIDE 20 MG PO TABS: 20.0000 mg | ORAL_TABLET | Freq: Every day | ORAL | Status: DC

## 2014-10-13 NOTE — Patient Instructions (Signed)
General Instructions: Dear Ms. Wiederhold,  Thank you for coming in today. You have done so well! Keep up the good work in regards to your diabetes and weight loss and congratulations again on quitting smoking!!  Please follow up with your new doctor next month for blood pressure check and please follow up with your heart doctor as soon as possible--you need to reschedule that appointment  Please continue to check your blood pressure at home daily and if values are elevated >140/90 call and let us know  Please reschedule your mammogram and papsmear  We will call you with results of your labs today  You wanted to restart your coreg today, lets start with 12.5mg  daily and monitor closely until you see cardiology  We changed your insulin sliding scale to novolog 5 units between 200-300, monitor for any low blood sugars and let us know right away and correct your low blood sugars with juice or glucose tablets if needed  Please bring your medicines with you each time you come to clinic.  Medicines may include prescription medications, over-the-counter medications, herbal remedies, eye drops, vitamins, or other pills.   Progress Toward Treatment Goals:  Treatment Goal 10/13/2014  Hemoglobin A1C improved  Blood pressure at goal  Stop smoking stopped smoking    Self Care Goals & Plans:  Self Care Goal 09/11/2014  Manage my medications take my medicines as prescribed; bring my medications to every visit  Monitor my health -  Eat healthy foods eat baked foods instead of fried foods; eat foods that are low in salt  Be physically active -  Stop smoking go to the [employer] website (https://scott-booker.info/); cut down the number of cigarettes smoked    Home Blood Glucose Monitoring 10/13/2014  Check my blood sugar 3 times a day  When to check my blood sugar before meals     Care Management & Community Referrals:  Referral 08/19/2014  Referrals made for care management support none needed   Referrals made to community resources -

## 2014-10-14 MED ORDER — MAGNESIUM OXIDE 400 (241.3 MG) MG PO TABS: 1.0000 | ORAL_TABLET | Freq: Two times a day (BID) | ORAL | Status: DC

## 2014-10-14 NOTE — Assessment & Plan Note (Addendum)
Repeat value checked today mildly improved to 1.3. Likely in setting of diuresis. Patient reports completing mag supplements yesterday with no major complaints. Discussed with Dr. Lynnae January and patient. We will continue mag ox 400mg  bid until next visit and recommend continued close follow up.   BMET and Mag on follow up visit Will also let Dr. Maudie Mercury from pharmacy know who can help monitor compliance with Mag and follow up

## 2014-10-14 NOTE — Assessment & Plan Note (Addendum)
Lab Results  Component Value Date   HGBA1C 8.2 10/13/2014   HGBA1C 9.7 07/28/2014   HGBA1C 8.6* 05/06/2014    Assessment: Diabetes control: fair control Progress toward A1C goal:  improved Comments: A1c improved today! Which is wonderful and she also has weight loss from trying by adjusting her diet and exercising more. She did bring her meter today with average cbg 202, highest 367 and lowest 130. Her highest values are around dinner time usually between 200-250 and she admits to snacking around that time.   Plan: Medications:  continue metformin 1000mg  bid, levemir 46 units qd, and adjusting novolog sliding scale to   Take novolog per sliding scale: CBG 70-120: 0 units  CBG 121-150: 1 unit  CBG 151-200: 2 units  CBG 201-300: 5 units  CBG 301-350: 7 units  CBG 351-400: 9 units  CBG > 400: call MD  Home glucose monitoring: Frequency: 3 times a day Timing: before meals Instruction/counseling given: reminded to get eye exam, reminded to bring blood glucose meter & log to each visit, reminded to bring medications to each visit, discussed foot care, discussed the need for weight loss and discussed diet Other plans: I congratulated her on her efforts and improvement today and hope that she will continue to work on her diet and weight loss and ultimately improve her glycemic control. She is motivated and hopeful as well. She is recommended to continue to check her blood sugars at home and return in ~1 month with her new pcp. She prefers to continue to use her novolog sliding scale so we slightly increased the cbg 200-250 units range from 3 to 5 unit of novolog to help with her hyperglycemia during those times and she will also work on improving her snacking during the evening. She is cautioned on monitoring for signs and symptoms of hypoglycemia and to notify us right away if that occurs.

## 2014-10-14 NOTE — Progress Notes (Signed)
Subjective:   Patient ID: Melody Gray female   DOB: 05-Jun-1970 44 y.o.   MRN: [id number]  HPI: [name] is a 44 y.o. female with extensive PMH as listed below presenting to opc today for diabetes follow up visit.   DM2--a1c improved today, she has also lost weight, and is motivated to continue to do so. She did bring her meter with her today.   HTN--reports BP are high in the mornings but she cannot recall how high. However, she was concerned and took her old irbesartan that was at home 1 dose. We discussed reviewing her medications with [name] before taking any new medications. She also admits to missing her cardiology appointment due to time constraints however will reschedule as soon as possible. BP today slightly elevated DBP but improved on repeat. She currently denies headache, chest pain or sob.   She is requesting refills for her medications until she is able to follow up with her specialists.   She has not scheduled her mammogram and papsmear yet as advised on prior appointments but says she will call them again.   She has stopped smoking which is wonderful and has also started walking.  Past Medical History  Diagnosis Date  . Sarcoidosis     Followed by Dr. Melvyn Novas; w/ liver involvement per biopsy 12/09, Reversible airway component so started on [facility] 01/2010; HFA 75% p coaching 05/2010  . Hypoxemia     CT angiogram 9/11>> No PE; PFTs 10/11- FEV1 1.20 (49%) with 16% better p B2, DLCO 33%> corrects to 84; O2 sats ok on 4 lpm X rapid walk X 3 laps 05/2010  . Morbid obesity     Target wt= 153 for BMI <30  . QT prolongation   . Essential hypertension   . Hx of cardiac cath 2/08    No CAD, no RAS,  normal EF  . Seborrheic dermatitis of scalp   . 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 Hep B serology neg. but Hep C ab +, HIV neg. 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 Cardiomyopathy, nonischemic      a. Varying EF over the years - initially 35% in 09/2010. Normal cors 12/2010. b. Echo 07/2014: EF 55-60%, no RWMA, + diastolic flattening and systolic flattening c/w RV volume and pressure overload, mild LAE/RAE, mod dilated RV, mod TR, PASP severely increased at 40mmHg. c. RHC 07/2014: moderate pulmonary HTN likely WHO group 3 with markedly elevated CVP and relatively normal left sided pressures.  . Diabetic retinopathy     Right eye 2/11  . Health maintenance examination     Mammogram 05/2010 Negative; Last Pap smear 03/2008; Last DM eye exam 2/11> mild non-proliferative diabetic retinopathy. OD  . Helicobacter pylori ab+ 05/2011    Pt was symptomatic and treatment planned for 05/2011  . Chronic diastolic CHF (congestive heart failure)   . Complication of anesthesia     " difficult waking "  . Sleep apnea   . Diabetes mellitus     insulin dependent  . Acute bronchitis 07/29/2014  . Pulmonary hypertension   . Anemia   . CKD (chronic kidney disease), stage II   . Hypomagnesemia   . Long term current use of systemic steroids   . Obesity hypoventilation syndrome   . Chronic respiratory failure     a. became O2 dependent in July 2011. b. She  required trache placement in 07/2013 and has been followed by pulmonology as well.  . Tobacco abuse   . Hypokalemia    Current Outpatient Prescriptions  Medication Sig Dispense Refill  . albuterol (PROVENTIL HFA;VENTOLIN HFA) 108 (90 BASE) MCG/ACT inhaler Inhale 2 puffs into the lungs every 6 (six) hours as needed for wheezing or shortness of breath. 1 Inhaler 2  . atorvastatin (LIPITOR) 20 MG tablet Take 1 tablet (20 mg total) by mouth daily at 6 PM. 90 tablet 3  . B-D INS SYRINGE 0.5CC/30GX1/2" 30G X 1/2" 0.5 ML MISC USE AS DIRECTED 100 each 1  . famotidine (PEPCID) 20 MG tablet Take 1 tablet (20 mg total) by mouth daily. 30 tablet 0  . ferrous sulfate 325 (65 FE) MG tablet Take 1 tablet (325 mg total) by mouth 3 (three) times daily with meals. 90  tablet 3  . fluticasone (FLONASE) 50 MCG/ACT nasal spray Place 2 sprays into both nostrils daily. 16 g 2  . glucose blood test strip 1 each by Other route as needed for other. Use as instructed    . insulin aspart (NOVOLOG) 100 UNIT/ML injection Take novolog per sliding scale: CBG 70-120: 0 units  CBG 121-150: 1 unit  CBG 151-200: 2 units  CBG 201-300: 5 units  CBG 301-350: 7 units  CBG 351-400: 9 units  CBG > 400: call MD 30 mL 3  . insulin detemir (LEVEMIR) 100 UNIT/ML injection Inject 0.46 mLs (46 Units total) into the skin at bedtime. 10 mL 11  . MAGNESIUM-OXIDE 400 (241.3 MG) MG tablet Take 1 tablet by mouth 2 (two) times daily.  0  . Melatonin 3 MG TABS Take 1 tablet (3 mg total) by mouth at bedtime as needed.  0  . metFORMIN (GLUCOPHAGE) 1000 MG tablet take 1 tablet by mouth twice a day with meals 180 tablet 4  . potassium chloride SA (K-DUR,KLOR-CON) 20 MEQ tablet Take 1 tablet (20 mEq total) by mouth daily. 30 tablet 0  . predniSONE (DELTASONE) 5 MG tablet Take 1 tablet (5 mg total) by mouth every other day. 30 tablet 0  . torsemide (DEMADEX) 20 MG tablet Take 1 tablet (20 mg total) by mouth daily. 30 tablet 0   No current facility-administered medications for this visit.   Family History  Problem Relation Age of Onset  . Cancer Mother     colon cancer  . Multiple sclerosis Father   . Asthma Sister     in childhood  . Diabetes Father   . Hypertension     History   Social History  . Marital Status: Single    Spouse Name: N/A  . Number of Children: 2  . Years of Education: N/A   Occupational History  . 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 Smoking status: Former Smoker -- 0.33 packs/day for 20 years    Types: Cigarettes    Start date: 10/05/2014  . Smokeless tobacco: Never Used     Comment: Needs to cut back.  . Alcohol Use: No  . Drug Use: No  . Sexual Activity: Not on file   Other Topics Concern  . None   Social History  Narrative   Diabetic card given 05/03/2010.   Financial assistance approved for 100% discount at [facility] and has [facility] card. Melody Gray 12/07/2009.      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   Review of Systems:  Constitutional:  Denies fever, chills  HEENT:  Denies congestion  Respiratory:  On chronic home o2, DOE improved per patient.   Cardiovascular:  Denies chest pain  Gastrointestinal:  Denies nausea, vomiting, abdominal pain  Genitourinary:  Denies dysuria  Musculoskeletal:  Denies gait problem, lower extremity edema improved  Skin:  Dry skin  Neurological:  Denies headache at this time   Objective:  Physical Exam: Filed Vitals:   10/13/14 1542 10/13/14 1927  BP: 139/91 134/80  Pulse: 111 101  Temp: 98.3 F (36.8 C)   TempSrc: Oral   Weight: 231 lb 1.6 oz (104.826 kg)   SpO2: 100% 100%   Vitals reviewed. General: sitting in chair, NAD HEENT: Dry skin of forehead, on [place] O2, +trach Cardiac: Tachycardia Pulm: clear to auscultation bilaterally, no wheezes Abd: soft, obese, BS present Ext: warm and well perfused, no pedal edema, moving all extremities, -tenderness to palpation Neuro: alert and oriented X3, following commands and responding to questions appropriately Skin: Dry skin, especially on bottom of feet  Assessment & Plan:  Discussed with Dr. Lynnae January

## 2014-10-14 NOTE — Assessment & Plan Note (Signed)
Patient reports having an upcoming dermatology appointment

## 2014-10-14 NOTE — Assessment & Plan Note (Signed)
Patient would like to continue iron supplementation at this time given that when she stops it she feels tired.   -restart iron supplementation -periodic monitoring of cbc and iron panel

## 2014-10-14 NOTE — Assessment & Plan Note (Signed)
She will reschedule her cardiology appointment as soon as possible. Reports improved DOE and has been working on weight loss. She reports high BP in mornings and took a dose of her ARB that was at home to help. Mild tachycardia today and initial BP with DBP 91. Improved lower extremity edema.   -restart coreg 12.5mg  daily until cardiology follow up -continue torsemide -monitor electrolytes while on diuretic

## 2014-10-14 NOTE — Assessment & Plan Note (Addendum)
Reviewed prior assessment with Dr. Eppie Gibson who noted HCV RNA previously negative, so does not require a repeat test at this time. HIV test negative within last 5 months, so repeat test not indicated at this time with lack of risk factors in interim.   We were not able to review her prior imaging during today's visit due to time and she needed to leave to drop a family member off.  On the phone she did mention that she was told in the past she did not have a good liver and that it was due to her sarcoidosis.  I discussed with Dr. Lynnae January, recommend review of records and discussion with patient in person with new pcp and proceed with further work up of ?cirrhosis as deemed appropriate.  More accurate characterization of liver on imaging would require an MRI (caution with hx of renal failure). Consider GI referral.

## 2014-10-14 NOTE — Assessment & Plan Note (Signed)
  Assessment: Progress toward smoking cessation:  stopped smoking Barriers to progress toward smoking cessation:    Comments: she has stopped smoking again which is wonderful  Plan: Instruction/counseling given:  I commended patient for quitting and reviewed strategies for preventing relapses. Educational resources provided:    Self management tools provided:    Medications to assist with smoking cessation:  None Patient agreed to the following self-care plans for smoking cessation:    Other plans: continued to encourage patient to continue cessation.

## 2014-10-14 NOTE — Assessment & Plan Note (Signed)
Remains on 5mg  daily, refilled prednisone today, she will schedule her pulmonary follow up as advised

## 2014-10-14 NOTE — Assessment & Plan Note (Signed)
She has started walking and has been working on her diet. Weight down to 231.1lbs in clinic today from ~250lb's in March and she says her scale at home says 227.5lbs.   I congratulated her on her efforts and encouraged her to continue doing well and exercising and working on her diet.

## 2014-10-14 NOTE — Assessment & Plan Note (Deleted)
Patient reports following up with optho although I do not see those records at this time.   Will ask RN to touch base with [place] for records

## 2014-10-14 NOTE — Assessment & Plan Note (Addendum)
BP Readings from Last 3 Encounters:  10/13/14 134/80  09/11/14 134/81  09/01/14 130/86   Lab Results  Component Value Date   NA 138 09/25/2014   K 4.7 09/25/2014   CREATININE 1.20* 09/25/2014   Assessment: Blood pressure control: controlled Progress toward BP goal:  at goal Comments: Reports BP elevated in mornings recently but she cannot recall how high. She took her home Avapro one dose in the morning as she was concerned. Denies headache or sob or syncope. Initial BP 139/91 with HR 111 and improved to 134/80 with HR 101 on repeat.   Plan: Medications:  continue torsemide 20mg  daily (refilled today) restarted coreg 12.5mg  qd for now until cardiology close follow up given slightly increased HR and reported increased BP in mornings Educational resources provided:   Self management tools provided:  check BP at home, record values and call us back and let us know Other plans: she is advised to check her blood pressure closely at home since restarting her BB and let us know right away if her BP is running low and she has associated symptoms or if it is consistently elevated >140/90 and she has headaches or dizziness, syncope or near syncope, chest pain, or sob

## 2014-10-14 NOTE — Assessment & Plan Note (Signed)
She needs to schedule her mammogram and papsmear. She says she keeps forgetting but will do so. She does recall a message from [facility] hospital and will call back. I stressed the importance of following up with them and getting her screening tests done again today.

## 2014-10-14 NOTE — Assessment & Plan Note (Signed)
Patient reports following up with optho although I do not see those records at this time.   Will ask RN to touch base with [place] for records

## 2014-10-16 NOTE — Progress Notes (Signed)
Internal Medicine Clinic Attending  Case discussed with Dr. Qureshi soon after the resident saw the patient.  We reviewed the resident's history and exam and pertinent patient test results.  I agree with the assessment, diagnosis, and plan of care documented in the resident's note. 

## 2014-10-19 DIAGNOSIS — B360 Pityriasis versicolor: Secondary | ICD-10-CM | POA: Diagnosis not present

## 2014-10-19 DIAGNOSIS — L218 Other seborrheic dermatitis: Secondary | ICD-10-CM | POA: Diagnosis not present

## 2014-10-30 ENCOUNTER — Telehealth: Payer: Self-pay | Admitting: Pharmacist

## 2014-10-30 NOTE — Telephone Encounter (Addendum)
Melody Gray is a 44 y.o. female was contacted for help with magnesium therapy.  A:  Pharmacy contacted:  [place], [place]  Magnesium was not picked up by patient from pharmacy (last filled 09/30/14 for 7 day supply  Patient reports poor adherence.  Barriers to adherence include: patient stated the pharmacy never filled the Rx  P:  Reinforced education on importance of medication adherence  Advised patient to pick up the medication and start taking it  Patient agrees to plan  Patient advised to contact clinic if any further concerns arise.

## 2014-11-09 DIAGNOSIS — F332 Major depressive disorder, recurrent severe without psychotic features: Secondary | ICD-10-CM | POA: Diagnosis not present

## 2014-11-13 DIAGNOSIS — F332 Major depressive disorder, recurrent severe without psychotic features: Secondary | ICD-10-CM | POA: Diagnosis not present

## 2014-11-16 ENCOUNTER — Other Ambulatory Visit: Payer: Self-pay | Admitting: *Deleted

## 2014-11-16 ENCOUNTER — Other Ambulatory Visit: Payer: Self-pay

## 2014-11-16 DIAGNOSIS — F332 Major depressive disorder, recurrent severe without psychotic features: Secondary | ICD-10-CM | POA: Diagnosis not present

## 2014-11-16 MED ORDER — TORSEMIDE 20 MG PO TABS: 20.0000 mg | ORAL_TABLET | Freq: Every day | ORAL | Status: DC

## 2014-11-18 ENCOUNTER — Telehealth: Payer: Self-pay | Admitting: Pharmacist

## 2014-11-19 MED ORDER — POTASSIUM CHLORIDE CRYS ER 20 MEQ PO TBCR: 20.0000 meq | EXTENDED_RELEASE_TABLET | Freq: Every day | ORAL | Status: DC

## 2014-11-19 MED ORDER — PREDNISONE 5 MG PO TABS: 5.0000 mg | ORAL_TABLET | ORAL | Status: DC

## 2014-11-19 NOTE — Telephone Encounter (Signed)
Patient states she has only been able to take magnesium for 2 days due to being unable to afford it. Patient reports she is asymptomatic at this time. Will send note to front desk to schedule a follow up.

## 2014-11-20 DIAGNOSIS — F332 Major depressive disorder, recurrent severe without psychotic features: Secondary | ICD-10-CM | POA: Diagnosis not present

## 2014-11-23 DIAGNOSIS — F332 Major depressive disorder, recurrent severe without psychotic features: Secondary | ICD-10-CM | POA: Diagnosis not present

## 2014-11-27 DIAGNOSIS — F332 Major depressive disorder, recurrent severe without psychotic features: Secondary | ICD-10-CM | POA: Diagnosis not present

## 2014-11-30 DIAGNOSIS — F332 Major depressive disorder, recurrent severe without psychotic features: Secondary | ICD-10-CM | POA: Diagnosis not present

## 2014-12-04 DIAGNOSIS — F332 Major depressive disorder, recurrent severe without psychotic features: Secondary | ICD-10-CM | POA: Diagnosis not present

## 2014-12-07 ENCOUNTER — Telehealth: Payer: Self-pay

## 2014-12-07 NOTE — Telephone Encounter (Signed)
error 

## 2014-12-09 ENCOUNTER — Other Ambulatory Visit: Payer: Self-pay | Admitting: Internal Medicine

## 2014-12-30 ENCOUNTER — Ambulatory Visit (HOSPITAL_COMMUNITY)
Admission: RE | Admit: 2014-12-30 | Discharge: 2014-12-30 | Disposition: A | Discharge status: Home / Self Care | Payer: Medicare Other | Source: Ambulatory Visit | Attending: Acute Care | Admitting: Acute Care

## 2014-12-30 DIAGNOSIS — J9610 Chronic respiratory failure, unspecified whether with hypoxia or hypercapnia: Secondary | ICD-10-CM | POA: Diagnosis not present

## 2014-12-30 DIAGNOSIS — J9692 Respiratory failure, unspecified with hypercapnia: Secondary | ICD-10-CM | POA: Insufficient documentation

## 2014-12-30 DIAGNOSIS — E662 Morbid (severe) obesity with alveolar hypoventilation: Secondary | ICD-10-CM | POA: Insufficient documentation

## 2014-12-30 DIAGNOSIS — Z930 Tracheostomy status: Secondary | ICD-10-CM | POA: Insufficient documentation

## 2014-12-30 DIAGNOSIS — I509 Heart failure, unspecified: Secondary | ICD-10-CM | POA: Diagnosis not present

## 2014-12-30 DIAGNOSIS — D869 Sarcoidosis, unspecified: Secondary | ICD-10-CM | POA: Insufficient documentation

## 2014-12-30 DIAGNOSIS — G4733 Obstructive sleep apnea (adult) (pediatric): Secondary | ICD-10-CM | POA: Insufficient documentation

## 2014-12-30 NOTE — Progress Notes (Addendum)
Brief History  This is a 44 year old female f/b Dr Melvyn Novas for chronic respiratory failure in the setting of OHS/OSA, Sarcoidosis and secondary PAH. Has a long standing h/o tobacco abuse and medical non-adherence. Since her trach she has had some repeat visits to the ER and admits re: URIs and PNA, but has been doing well from a trach stand-point. I saw her last on 5/11. At that time she was doing well after changing to a # 4 cuffless. She returns today w/ no new complaints and in good spirits. Here for routine trach change.   Review of Systems:  Bolds are positive  Constitutional: weight loss, gain, night sweats, Fevers, chills, fatigue .  HEENT: headaches, Sore throat, sneezing, nasal congestion, post nasal drip, Difficulty swallowing, Tooth/dental problems, visual complaints visual changes, ear ache CV: chest pain, radiates: ,Orthopnea, PND, swelling in lower extremities, dizziness, palpitations, syncope.  GI heartburn, indigestion, abdominal pain, nausea, vomiting, diarrhea, change in bowel habits, loss of appetite, bloody stools.  Resp: cough, productive: , hemoptysis, dyspnea, chest pain, pleuritic.  Skin: rash or itching or icterus GU: dysuria, change in color of urine, urgency or frequency. flank pain, hematuria  MS: joint pain or swelling. decreased range of motion  Psych: change in mood or affect. depression or anxiety.  Neuro: difficulty with speech, weakness, numbness, ataxia    VS- BP-138/80, RR-18, HR-92, SpO2-100% on 2L with PMV  General appearance: 44 year old  Mouth: membranes and no mucosal ulcerations; normal hard and soft palate Neck: Trachea midline, # 4 trach, uncuffed trach stoma unremarkable w/out any ulcerations  Lungs: CTA, with normal respiratory effort and no intercostal retractions CV: RRR, no MRGs  Abdomen: Soft, non-tender; no masses or HSM Extremities: No peripheral edema or extremity lymphadenopathy Skin: Normal temperature, turgor and  texture; no rash, ulcers or subcutaneous nodules Psych: Appropriate affect, alert and oriented to person, place and time Date  Seen by:  [name] size  Trach change pmv  Capping  Stoma site DME Diet  Goals   7/29 yacoub 6 cuffless no        10/13 Cherilyn Sautter 6 shiley cufless  yes yes n/a Unremarkable   Regular  Continue day time trach w/ PMV  No decannulation.   06/04/14 Issaih Kaus 6 shiley cuffless  yes yes no Looks good   Regular  cont same plan of care. Will decide on Portex change next time.   5/11 Silveria Botz 4 shiley cuffless  yes yes  excellent advanced Regular  No change   8/31 Sriya Kroeze 4 shiley cuffless yes yes no excellent advanced regular No change, never decannulate.      Impression  44 year old with morbid obesity, OHS/OSA, sarcoid, secondary PAH and heart failure. Trached emergently for hypercarbic respiratory failure. Not a candidate for decannulation   Stable. Doing excellent   Plan:  - cont 4 cuffless  - ROV 3 months.   [name] ACNP-BC [place]

## 2014-12-30 NOTE — Progress Notes (Signed)
Tracheostomy Procedure Note  Melody Gray [id number] 12-Aug-1970  Pre Procedure Tracheostomy Information  Trach Brand: Shiley Size: 4 Style: Uncuffed Secured by: Velcro  VS- BP-138/80, RR-18, HR-92, SpO2-100% on 2L with PMV  Procedure: trach change    Post Procedure Tracheostomy Information  Trach Brand: Shiley Size: 4 Style: Uncuffed Secured by: Velcro   Post Procedure Evaluation:  ETCO2 positive color change from yellow to purple : Yes.   Vital signs:blood pressure 145/81, pulse 88, respirations 20 and pulse oximetry 100% on 2L [place] with PMV Patients current condition: stable Complications: No apparent complications Trach site exam: clean Wound care done: 4 x 4 gauze Patient did tolerate procedure well.   Education: None needed  Prescription needs: None needed  Will see back in trach clinic in 3 months. Obturator sent home with pt.

## 2015-01-05 ENCOUNTER — Ambulatory Visit: Payer: Medicare Other | Admitting: Internal Medicine

## 2015-01-07 ENCOUNTER — Ambulatory Visit (INDEPENDENT_AMBULATORY_CARE_PROVIDER_SITE_OTHER): Payer: Medicare Other | Admitting: Internal Medicine

## 2015-01-07 ENCOUNTER — Encounter: Payer: Self-pay | Admitting: Internal Medicine

## 2015-01-07 VITALS — BP 124/80 | HR 86 | Ht 61.0 in | Wt 228.6 lb

## 2015-01-07 DIAGNOSIS — D869 Sarcoidosis, unspecified: Secondary | ICD-10-CM | POA: Diagnosis not present

## 2015-01-07 DIAGNOSIS — J9612 Chronic respiratory failure with hypercapnia: Secondary | ICD-10-CM | POA: Diagnosis not present

## 2015-01-07 NOTE — Progress Notes (Signed)
Subjective:     Patient ID: BEEBE COUNCE, female   DOB: Dec 28, 1970   MRN: [id number]   Brief patient profile:  25  yobf  Quit smoking 07/2013  dx of sarcoidosis in 2001 = sob, cough became 02 dep in July [id number] complicated by [facility] and morbid obesity with OHS requiring trach 07/2013    History of Present Illness   January 06, 2010 ov  c/o increased SOB. Pt states outpt clinic advised her to follow up with Dr. Melvyn Novas for Sacoidosis. doe on 02 at 2 lpm sitting then 4 with activity gets off at the curb struggles with grocery store. Try taking Prednisone one half even days in am with breakfast  Be sure you take omeprazole Take one 30-60 min before first and last meals of the day  Stop nifedipine  Start Cardizem 240 mg once daily  Wear 24 hours per day, 2 lpm at rest and sleeping, 4lpm with activity, this is the best way to help your heart function better  CT Chest ( repeated with contrast)> no PE, just sarcoid changes   February 09, 2010 Followup sarcoid w/ PFTs. Breathing is the same- no better or worse.on 02 2 lpm w/in and then outside using 4lpm> cc doe x 50 ft still has to stop every aisle at [employer] but not [facility] parking. rec Start Dulera 2 puffs first thing in am and 2 puffs again in pm about 12 hours later and prednisone 10 mg every other day.      6/10/2015post hosp  f/u ov/ transition of care/Jacaria Colburn re: sarcoid / pred at 10 mg / ? Not smoking ? / now trached Chief Complaint  Patient presents with  . Follow-up    Pt states her breathing has improved. No new co's today.   did not understand purpose of PMV leaves it in 24/7 and not using T collar at hs  Edema has resolved  rec Leave the purple plug out while sleeping with humidifed 02 collar in place Prednisone 10 mg daily    02/16/2014 f/u ov/Cambrie Sonnenfeld re: sarcoid on pred 10 mg daily  Chief Complaint  Patient presents with  . Follow-up    Pt states that her cough is some better- still prod but with clear sputum.  Her breathing is improved  back to her normal baseline.    amb on 02 2lpm can do HT s HC parking rec pred 10 mg a/w 5 mg daily      09/01/2014 f/u ov/Rajveer Handler re: sarcoid without apparent flare on 10 a/w 5 mg daily  Chief Complaint  Patient presents with  . Follow-up    Pt states that her breathing is doing well today. She denies any new co's. She rarely uses rescue inhaler.   02 4lpm 24/7 uses trach collar at hs  pred 10 mg a/w 5 mg daily  Flares of sob occur with fluid retention but all adjustments per cards to diuretic rx  rec Try prednisone 5 mg daily to see if you don't do just as well with your breathing and if so leave it at 5 mg daily    01/07/2015 f/u ov/Daxx Tiggs re: sarcoid ? Still steroid dep/ morbid obesity/ chronic trach  Chief Complaint  Patient presents with  . Follow-up    Pt states that she is overall doing well and denies any co's today.   2lpm portable unless walking then 3-4 >  Has to sit and rest at wm but not HT  Collar is set on  28%   No obvious day to day or daytime variabilty or assoc cp or chest tightness, subjective wheeze overt sinus or hb symptoms. No unusual exp hx or h/o childhood pna/ asthma or knowledge of premature birth.  Sleeping ok on t collar without nocturnal  or early am exacerbation  of respiratory  c/o's or need for noct saba. Also denies any obvious fluctuation of symptoms with weather or environmental changes or other aggravating or alleviating factors except as outlined above   Current Medications, Allergies, Complete Past Medical History, Past Surgical History, Family History, and Social History were reviewed in [employer] record.  ROS  The following are not active complaints unless bolded sore throat, dysphagia, dental problems, itching, sneezing,  nasal congestion or excess/ purulent secretions, ear ache,   fever, chills, sweats, unintended wt loss, pleuritic or exertional cp, hemoptysis,  orthopnea pnd or leg swelling, presyncope, palpitations,  heartburn, abdominal pain, anorexia, nausea, vomiting, diarrhea  or change in bowel or urinary habits, change in stools or urine, dysuria,hematuria,  rash, arthralgias, visual complaints, headache, numbness weakness or ataxia or problems with walking or coordination,  change in mood/affect or memory.         Past Medical History:  Sarcoidosis (Dr Hussein Macdougal)-w/ liver involvement per biopsy 03/2008 - Reversible airway component so start [facility] 01/2010 > better  - HFa 75% p coaching May 04, 2010  -  Off chronic prednisone 05/2011> restarted 07/2013  Unexplained Hypoxemia July 2011  - CT angiogram 01/07/10 >>> no PE  - PFT's February 09, 2010 FEV1 1.20 (49%) with ratio 72 and FRC 90%, FEV1  16% better p B2, DLC0 33% > corrects to 84   Morbid obesity  - Target wt = 153 for BMI < 30  - Trach 08/10/13 with very difficult airway([place]) QT prolongation  Diabetes mellitus, type II  Hypertension  Heart catheterization 06-06-06 : No CAD, no RAS, normal EF  Hx eclampsia  Hx of scalp seborrheic dermatitis            Objective:   Physical Exam  She is a minimally  cushingoid-appearing amb bf with trach in place / pmv in place on nasal 02 2lpm   wt 238 January 06, 2010 >   217 May 04, 2010 >  02/12/2012  213 >  215 04/04/2012  > 239 04/08/2013 > 07/24/2013 249 > 07/31/2013  252 >   10/08/2013  233> 11/19/2013  242 >245 12/02/2013 >241 01/29/2014 > 02/16/2014  250 > 09/01/2014   244 > 01/07/2015   229        GEN: A/Ox3; pleasant , NAD  HEENT:  [place]/AT,  EACs-clear, TMs-wnl, NOSE-clear, THROAT-clear, no lesions, no postnasal drip or exudate noted.   NECK:  Supple w/ fair ROM; no JVD; normal carotid impulses w/o bruits; no thyromegaly or nodules palpated; no lymphadenopathy.Trach midline, no redness, dsg clean and dry   RESP  Decreased BS in bases, no accessory muscle use, no dullness to percussion  CARD:  RRR, no m/r/g,  pulses intact, no cyanosis or clubbing  No sign Edema   GI:   Soft & nt; nml bowel sounds;  no organomegaly or masses detected.  Musco: Warm bil, no deformities or joint swelling noted.   Neuro: alert, no focal deficits noted.    Skin: Warm, no lesions or rashes     I personally reviewed images and agree with radiology impression as follows:  CXR:  08/09/14 Streaky and interstitial bilateral mid and lower lung opacity  has mildly progressed. Differential considerations include increased atelectasis, mild interstitial edema, or less likely bilateral pneumonia. No pleural effusion identified.     Assessment:

## 2015-01-07 NOTE — Patient Instructions (Addendum)
Prednisone 5 mg one on even and one half on odd x 2 weeks then drop the odd day off completely so you just take prednisone 5 mg on even days and none on odd.   If worse cough/nausea then resume previous dose.  Increase the 02 to 4lpm with exercise otherwise 2lpm 24/7 is fine   Please schedule a follow up visit in 3 months but call sooner if needed

## 2015-01-11 NOTE — Assessment & Plan Note (Signed)
w/ liver involvement per biopsy 03/2008   - PFT's 11/21/2011 FEV1 1.23 (50%) ratio 67 and no better p B2,  DLCO 31 corrects to 73   - trial off chronic prednisone 02/12/2012 > resumed 07/2013  -  rx reduce pred to 5 mg daily 09/01/2014  The goal with a chronic steroid dependent illness is always arriving at the lowest effective dose that controls the disease/symptoms and not accepting a set "formula" which is based on statistics or guidelines that don't always take into account patient  variability or the natural hx of the dz in every individual patient, which may well vary over time.  For now therefore I recommend the patient maintain  5/2.5 x x sev weeks then drop to 5 mg qod if tol  I had an extended discussion with the patient reviewing all relevant studies completed to date and  lasting 15 to 20 minutes of a 25 minute visit    Each maintenance medication was reviewed in detail including most importantly the difference between maintenance and prns and under what circumstances the prns are to be triggered using an action plan format that is not reflected in the computer generated alphabetically organized AVS.    Please see instructions for details which were reviewed in writing and the patient given a copy highlighting the part that I personally wrote and discussed at today's ov.

## 2015-01-11 NOTE — Assessment & Plan Note (Signed)
-    Complicated by PAH - Walked 3 laps @ 185 ft each stopped due to  End of test, no desat on 4lpm 10/05/2010  - 02/02/2011  Walked RA  2 laps @ 185 ft each stopped due to  desat to 88 so change rx to 2lpm with activity outside the house and at hs - 08/21/2011  Walked RA x 1 laps @ 185 ft each stopped due to desat 85% - 11/21/2011  Walked RA  2 laps @ 185 ft each stopped due to  Sat 88 -  ONO RA < 89% x 6h 26 min 03/19/12 > repeat on 2lpm 04/05/12 > desats resolved - 04/08/2013   Walked RA x one lap @ 185 stopped due to  sats 85%  - HCO3 = 40 07/31/13 c/w hypercarbia - 08/10/13 Trach emergently by Janace Hoard with very difficult airway  - 01/07/2015 02 sat = 88% RA - 01/07/2015 2lpm at rest was 98%  - 01/07/2015  Walked RA x 3 laps @ 185 ft each stopped due to  End of study, nl pace, no sob or desat    Last several bicarb are in the mid 20s indicating the trach has improved the hypercarbia and she is generally done much better since the trach  rec 2lpm 24/7 except for 4lpm with walking

## 2015-01-11 NOTE — Assessment & Plan Note (Signed)
Body mass index is 43.22  Lab Results  Component Value Date   TSH 2.500 08/16/2013     Contributing to gerd tendency/ doe/reviewed need  achieve and maintain neg calorie balance > defer f/u primary care including intermittently monitoring thyroid status

## 2015-01-19 ENCOUNTER — Other Ambulatory Visit: Payer: Self-pay | Admitting: Internal Medicine

## 2015-01-22 NOTE — Progress Notes (Signed)
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 Echocardiogram in June of 2012 revealed an ejection fraction of 35%. There was mild biatrial enlargement, mild right ventricular enlargement and mildly reduced RV function. Cardiac MRI in July of 2012 revealed moderate to severe LV systolic dysfunction, EF [id number]. The anterior and anterolateral walls appeared severely hypokinetic. Mild RV dilation with mild systolic dysfunction. Possible small areas of mid wall delayed enhancement in the apical septal and apical lateral walls. These areas were very faint and not definitely of clinical significance. Mid wall enhancement can be found in infiltrative cardiomyopathies such as that due to sarcoidosis. TSH and ferritin normal. Cardiac catheterization repeated in September of 2012 and showed an ejection fraction of 40%. There was no coronary disease. Echo repeated in June 2015 and showed normal LV function, grade 2 diastolic dysfunction, mild LAE, mild RAE; mild RVE with reduced function. Echo 4/16 showed normal LV function, mild biatrial enlargement, moderate RVE adn moderate TR with severely elevated pulmonary pressure. [place] 4/16 showed RA 18, PA 68/24, PCWP 21 adn CO 6.5. Since I last saw her   Current Outpatient Prescriptions  Medication Sig Dispense Refill  . albuterol (PROVENTIL HFA;VENTOLIN HFA) 108 (90 BASE) MCG/ACT inhaler Inhale 2 puffs into the lungs every 6 (six) hours as needed for wheezing or shortness of breath. 1 Inhaler 2  . atorvastatin (LIPITOR) 20 MG tablet Take 1 tablet (20 mg total) by mouth daily at 6 PM. 90 tablet 3  . B-D INS SYRINGE 0.5CC/30GX1/2" 30G X 1/2" 0.5 ML MISC USE AS DIRECTED 100 each 1  . carvedilol (COREG) 12.5 MG tablet Take 1 tablet by mouth at bedtime.  0  . famotidine (PEPCID) 20 MG tablet Take 1 tablet (20 mg total) by mouth daily. 30 tablet 0  . ferrous sulfate 325 (65 FE) MG tablet Take 1 tablet (325 mg total) by mouth 3 (three) times daily with  meals. 90 tablet 3  . fluticasone (FLONASE) 50 MCG/ACT nasal spray Place 2 sprays into both nostrils daily. 16 g 2  . glucose blood test strip 1 each by Other route as needed for other. Use as instructed    . insulin aspart (NOVOLOG) 100 UNIT/ML injection Take novolog per sliding scale: CBG 70-120: 0 units  CBG 121-150: 1 unit  CBG 151-200: 2 units  CBG 201-300: 5 units  CBG 301-350: 7 units  CBG 351-400: 9 units  CBG > 400: call MD 30 mL 3  . insulin detemir (LEVEMIR) 100 UNIT/ML injection Inject 0.46 mLs (46 Units total) into the skin at bedtime. 40 mL 3  . Melatonin 3 MG TABS Take 1 tablet (3 mg total) by mouth at bedtime as needed.  0  . metFORMIN (GLUCOPHAGE) 1000 MG tablet take 1 tablet by mouth twice a day with meals 180 tablet 4  . potassium chloride SA (K-DUR,KLOR-CON) 20 MEQ tablet Take 1 tablet (20 mEq total) by mouth daily. 30 tablet 3  . predniSONE (DELTASONE) 5 MG tablet Take 1 tablet (5 mg total) by mouth every other day. 30 tablet 3  . torsemide (DEMADEX) 20 MG tablet Take 1 tablet (20 mg total) by mouth daily. 30 tablet 1   No current facility-administered medications for this visit.     Past Medical History  Diagnosis Date  . Sarcoidosis     Followed by Dr. Melvyn Novas; w/ liver involvement per biopsy 12/09, Reversible airway component so started on [facility] 01/2010; HFA 75% p coaching 05/2010  . Hypoxemia  CT angiogram 9/11>> No PE; PFTs 10/11- FEV1 1.20 (49%) with 16% better p B2, DLCO 33%> corrects to 84; O2 sats ok on 4 lpm X rapid walk X 3 laps 05/2010  . Morbid obesity     Target wt= 153 for BMI <30  . QT prolongation   . Essential hypertension   . Hx of cardiac cath 2/08    No CAD, no RAS,  normal EF  . Seborrheic dermatitis of scalp   . 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 Hep B serology neg. but Hep C ab +, HIV neg. 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 Cardiomyopathy,  nonischemic     a. Varying EF over the years - initially 35% in 09/2010. Normal cors 12/2010. b. Echo 07/2014: EF 55-60%, no RWMA, + diastolic flattening and systolic flattening c/w RV volume and pressure overload, mild LAE/RAE, mod dilated RV, mod TR, PASP severely increased at 52mmHg. c. RHC 07/2014: moderate pulmonary HTN likely WHO group 3 with markedly elevated CVP and relatively normal left sided pressures.  . Diabetic retinopathy     Right eye 2/11  . Health maintenance examination     Mammogram 05/2010 Negative; Last Pap smear 03/2008; Last DM eye exam 2/11> mild non-proliferative diabetic retinopathy. OD  . Helicobacter pylori ab+ 05/2011    Pt was symptomatic and treatment planned for 05/2011  . Chronic diastolic CHF (congestive heart failure)   . Complication of anesthesia     " difficult waking "  . Sleep apnea   . Diabetes mellitus     insulin dependent  . Acute bronchitis 07/29/2014  . Pulmonary hypertension   . Anemia   . CKD (chronic kidney disease), stage II   . Hypomagnesemia   . Long term current use of systemic steroids   . Obesity hypoventilation syndrome   . Chronic respiratory failure     a. became O2 dependent in July 2011. b. She required trache placement in 07/2013 and has been followed by pulmonology as well.  . Tobacco abuse   . Hypokalemia     Past Surgical History  Procedure Laterality Date  . Tubal ligation    . Breast surgery    . Cesarean section    . Tracheostomy tube placement N/A 08/10/2013    Procedure: TRACHEOSTOMY;  Surgeon: Melissa Montane, MD;  Location: [place];  Service: ENT;  Laterality: N/A;  . Right heart catheterization N/A 08/12/2014    Procedure: RIGHT HEART CATH;  Surgeon: Jolaine Artist, MD;  Location: [facility] CATH LAB;  Service: Cardiovascular;  Laterality: N/A;    Social History   Social History  . Marital Status: Single    Spouse Name: N/A  . Number of Children: 2  . Years of Education: N/A   Occupational History  . 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 Smoking status: Former Smoker -- 0.33 packs/day for 20 years    Types: Cigarettes    Start date: 10/05/2014  . Smokeless tobacco: Never Used     Comment: Needs to cut back.  . Alcohol Use: No  . Drug Use: No  . Sexual Activity: Not on file   Other Topics Concern  . Not on file   Social History Narrative   Diabetic card given 05/03/2010.   Financial assistance approved for 100% discount at [facility] and has [facility]  card. [place] 12/07/2009.      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    ROS: no fevers or chills, productive cough, hemoptysis, dysphasia, odynophagia, melena, hematochezia, dysuria, hematuria, rash, seizure activity, orthopnea, PND, pedal edema, claudication. Remaining systems are negative.  Physical Exam: Well-developed well-nourished in no acute distress.  Skin is warm and dry.  HEENT is normal.  Neck is supple.  Chest is clear to auscultation with normal expansion.  Cardiovascular exam is regular rate and rhythm.  Abdominal exam nontender or distended. No masses palpated. Extremities show no edema. neuro grossly intact  ECG     This encounter was created in error - please disregard.

## 2015-01-28 ENCOUNTER — Encounter: Payer: Medicare Other | Admitting: Cardiology

## 2015-02-03 ENCOUNTER — Other Ambulatory Visit: Payer: Self-pay | Admitting: Cardiology

## 2015-02-05 ENCOUNTER — Other Ambulatory Visit: Payer: Self-pay | Admitting: Pulmonary Disease

## 2015-02-05 NOTE — Telephone Encounter (Signed)
Torsemide is filled by another md office and was just filled

## 2015-02-05 NOTE — Telephone Encounter (Signed)
Pt called requesting torsemide and test strips to be filled @ [facility] on [address].

## 2015-02-07 MED ORDER — GLUCOSE BLOOD VI STRP: 1.0000 | ORAL_STRIP | Status: DC | PRN

## 2015-02-10 ENCOUNTER — Other Ambulatory Visit: Payer: Self-pay | Admitting: Pulmonary Disease

## 2015-02-10 NOTE — Telephone Encounter (Signed)
They rec'd the script but need a form filled out, they are faxing now

## 2015-02-10 NOTE — Telephone Encounter (Signed)
NEED REFILL ON TESTING STRIPS, SEND TO [facility] [address]

## 2015-03-03 ENCOUNTER — Telehealth: Payer: Self-pay | Admitting: Internal Medicine

## 2015-03-03 DIAGNOSIS — G4730 Sleep apnea, unspecified: Secondary | ICD-10-CM

## 2015-03-03 DIAGNOSIS — J9612 Chronic respiratory failure with hypercapnia: Secondary | ICD-10-CM

## 2015-03-03 NOTE — Telephone Encounter (Signed)
Spoke with pt, wants to know if we can place an order for an inogen POC through [facility] for pt.  States that she uses 2lpm continuously.    MW please advise.  Thanks!

## 2015-03-03 NOTE — Telephone Encounter (Signed)
lmtcb for pt.  

## 2015-03-03 NOTE — Telephone Encounter (Signed)
Order for Ambulatory O2 Titration for POC qualification has been put in. Patient notified.  Patient says that she discussed a Sleep Study with Dr. Melvyn Novas at one time and he told her that if she no longer has sleep apnea that she could have her trach removed.  She would like to have a sleep study done.    MW - please advise.

## 2015-03-03 NOTE — Telephone Encounter (Signed)
Fine with me to order ambulatory 02 titration to see if qualifies for POC

## 2015-03-03 NOTE — Telephone Encounter (Signed)
Patient returned call, may be reached at [phone]

## 2015-03-04 NOTE — Telephone Encounter (Signed)
lmomtcb x1 for pt Sleep study ordered

## 2015-03-04 NOTE — Telephone Encounter (Signed)
Ok to order standard sleep study on the amt of 02 she normally uses with the trach completely plugged

## 2015-03-05 NOTE — Telephone Encounter (Signed)
lmtcb x2 

## 2015-03-08 NOTE — Telephone Encounter (Signed)
Patient notified that Sleep Study has been ordered. Nothing further needed.. Closing encounter

## 2015-03-30 ENCOUNTER — Other Ambulatory Visit: Payer: Self-pay

## 2015-04-08 ENCOUNTER — Ambulatory Visit: Payer: Medicare Other | Admitting: Internal Medicine

## 2015-04-20 ENCOUNTER — Other Ambulatory Visit: Payer: Self-pay | Admitting: Cardiology

## 2015-04-20 MED ORDER — TORSEMIDE 20 MG PO TABS: 20.0000 mg | ORAL_TABLET | Freq: Every day | ORAL | Status: DC

## 2015-04-20 NOTE — Telephone Encounter (Signed)
Pt overdue for care - missed last appt w/ Dr. Stanford Breed. Submitted refill -- Notification given to schedule appt.

## 2015-04-20 NOTE — Telephone Encounter (Signed)
°*  STAT* If patient is at the pharmacy, call can be transferred to refill team.   1. Which medications need to be refilled? (please list name of each medication and dose if known) Torsemide   2. Which pharmacy/location (including street and city if local pharmacy) is medication to be sent to? [facility] on [address]  3. Do they need a 30 day or 90 day supply? She would like a 90 day

## 2015-04-28 ENCOUNTER — Telehealth: Payer: Self-pay | Admitting: Pulmonary Disease

## 2015-04-28 NOTE — Telephone Encounter (Signed)
Pt requesting tramadol and meloxicam to be filled.

## 2015-04-28 NOTE — Telephone Encounter (Signed)
Called pt - not on current med list. Having right foot pain; states its arthritis, using walker now; flared-up yesterday. Has used Tramadol/Meloxicam in the past. Uses [facility] on [address].

## 2015-04-30 MED ORDER — MELOXICAM 15 MG PO TABS: 15.0000 mg | ORAL_TABLET | Freq: Every day | ORAL | Status: DC

## 2015-04-30 NOTE — Telephone Encounter (Signed)
Meloxicam and tramadol order was 02/06/2013, prescribed by urgent care. Refilled 04/22/2013 by PCP one time. Will order meloxicam 15mg  daily #7. She has follow up scheduled with me already next Thursday.

## 2015-05-06 ENCOUNTER — Encounter: Payer: Self-pay | Admitting: Pulmonary Disease

## 2015-05-06 ENCOUNTER — Encounter: Payer: Medicare Other | Admitting: Pulmonary Disease

## 2015-05-10 ENCOUNTER — Ambulatory Visit (HOSPITAL_BASED_OUTPATIENT_CLINIC_OR_DEPARTMENT_OTHER): Payer: Medicare Other | Attending: Internal Medicine | Admitting: Radiology

## 2015-05-10 VITALS — Ht 61.0 in | Wt 230.0 lb

## 2015-05-10 DIAGNOSIS — Z7984 Long term (current) use of oral hypoglycemic drugs: Secondary | ICD-10-CM | POA: Diagnosis not present

## 2015-05-10 DIAGNOSIS — G4730 Sleep apnea, unspecified: Secondary | ICD-10-CM | POA: Insufficient documentation

## 2015-05-10 DIAGNOSIS — G4733 Obstructive sleep apnea (adult) (pediatric): Secondary | ICD-10-CM | POA: Diagnosis not present

## 2015-05-10 DIAGNOSIS — E119 Type 2 diabetes mellitus without complications: Secondary | ICD-10-CM | POA: Diagnosis not present

## 2015-05-10 DIAGNOSIS — Z794 Long term (current) use of insulin: Secondary | ICD-10-CM | POA: Diagnosis not present

## 2015-05-10 DIAGNOSIS — Z6841 Body Mass Index (BMI) 40.0 and over, adult: Secondary | ICD-10-CM | POA: Insufficient documentation

## 2015-05-10 DIAGNOSIS — I10 Essential (primary) hypertension: Secondary | ICD-10-CM | POA: Insufficient documentation

## 2015-05-10 DIAGNOSIS — I493 Ventricular premature depolarization: Secondary | ICD-10-CM | POA: Insufficient documentation

## 2015-05-10 DIAGNOSIS — R0683 Snoring: Secondary | ICD-10-CM | POA: Diagnosis not present

## 2015-05-14 DIAGNOSIS — G4733 Obstructive sleep apnea (adult) (pediatric): Secondary | ICD-10-CM

## 2015-05-14 NOTE — Progress Notes (Signed)
Patient Name: Melody Gray, Boundy Date: 05/10/2015 Gender: Female D.O.B: [birth year] Age (years): 44 Referring Provider: Tanda Rockers Height (inches): 61 Interpreting Physician: Kara Mead MD, ABSM Weight (lbs): 230 RPSGT: Carolin Coy BMI: 42 MRN: [id number] Neck Size: 16.00   CLINICAL INFORMATION Sleep Study Type: NPSG Indication for sleep study: Diabetes, Hypertension, Morbid Obesity Epworth Sleepiness Score: 2   SLEEP STUDY TECHNIQUE As per the AASM Manual for the Scoring of Sleep and Associated Events v2.3 (April 2016) with a hypopnea requiring 4% desaturations. The channels recorded and monitored were frontal, central and occipital EEG, electrooculogram (EOG), submentalis EMG (chin), nasal and oral airflow, thoracic and abdominal wall motion, anterior tibialis EMG, snore microphone, electrocardiogram, and pulse oximetry.   MEDICATIONS Patient's medications include: METFORMIN, CARVEDILOL, Levemir. Medications self-administered by patient during sleep study : No sleep medicine administered.   SLEEP ARCHITECTURE The study was initiated at 10:05:50 PM and ended at 5:15:33 AM. Sleep onset time was 64.5 minutes and the sleep efficiency was 65.2%. The total sleep time was 280.0 minutes. Stage REM latency was N/A minutes. The patient spent 42.14% of the night in stage N1 sleep, 53.39% in stage N2 sleep, 4.46% in stage N3 and 0.00% in REM. Alpha intrusion was absent. Supine sleep was 34.29%.   RESPIRATORY PARAMETERS The overall apnea/hypopnea index (AHI) was 22.3 per hour. There were 104 total apneas, including 104 obstructive, 0 central and 0 mixed apneas. There were 0 hypopneas and 237 RERAs. The AHI during Stage REM sleep was N/A per hour. AHI while supine was 1.3 per hour. The mean oxygen saturation was 98.34%. The minimum SpO2 during sleep was 94.00%. Moderate snoring was noted during this study.  CARDIAC DATA The 2 lead EKG demonstrated sinus rhythm. The mean  heart rate was 81.53 beats per minute. Other EKG findings include: PVCs.   LEG MOVEMENT DATA The total PLMS were 2 with a resulting PLMS index of 0.43. Associated arousal with leg movement index was 0.2 .   IMPRESSIONS - Moderate obstructive sleep apnea occurred during this study (AHI = 22.3/h). - No significant central sleep apnea occurred during this study (CAI = 0.0/h). - Mild oxygen desaturation was noted during this study (Min O2 = 94.00%). - The patient snored with Moderate snoring volume. - EKG findings include PVCs. - Clinically significant periodic limb movements did not occur during sleep. No significant associated arousals.   DIAGNOSIS - Obstructive Sleep Apnea (327.23 [G47.33 ICD-10])   RECOMMENDATIONS - Therapeutic CPAP titration to determine optimal pressure required to alleviate sleep disordered breathing. - Avoid alcohol, sedatives and other CNS depressants that may worsen sleep apnea and disrupt normal sleep architecture. - Sleep hygiene should be reviewed to assess factors that may improve sleep quality. - Weight management and regular exercise should be initiated or continued if appropriate.  Kara Mead MD. [place]. [facility]

## 2015-05-26 ENCOUNTER — Inpatient Hospital Stay (HOSPITAL_COMMUNITY): Admission: RE | Admit: 2015-05-26 | Payer: Medicare Other | Source: Ambulatory Visit

## 2015-06-16 ENCOUNTER — Ambulatory Visit (HOSPITAL_COMMUNITY)
Admission: RE | Admit: 2015-06-16 | Discharge: 2015-06-16 | Disposition: A | Discharge status: Home / Self Care | Payer: Medicare Other | Source: Ambulatory Visit | Attending: Acute Care | Admitting: Acute Care

## 2015-06-16 DIAGNOSIS — I509 Heart failure, unspecified: Secondary | ICD-10-CM | POA: Insufficient documentation

## 2015-06-16 DIAGNOSIS — E662 Morbid (severe) obesity with alveolar hypoventilation: Secondary | ICD-10-CM | POA: Insufficient documentation

## 2015-06-16 DIAGNOSIS — I272 Other secondary pulmonary hypertension: Secondary | ICD-10-CM | POA: Insufficient documentation

## 2015-06-16 DIAGNOSIS — Z87891 Personal history of nicotine dependence: Secondary | ICD-10-CM | POA: Diagnosis not present

## 2015-06-16 DIAGNOSIS — J9610 Chronic respiratory failure, unspecified whether with hypoxia or hypercapnia: Secondary | ICD-10-CM | POA: Insufficient documentation

## 2015-06-16 DIAGNOSIS — D869 Sarcoidosis, unspecified: Secondary | ICD-10-CM | POA: Insufficient documentation

## 2015-06-16 DIAGNOSIS — Z930 Tracheostomy status: Secondary | ICD-10-CM

## 2015-06-16 DIAGNOSIS — Z430 Encounter for attention to tracheostomy: Secondary | ICD-10-CM | POA: Insufficient documentation

## 2015-06-16 NOTE — Progress Notes (Signed)
   CC:  Trach ROV  Brief History  This is a 45 year old female f/b Dr Melvyn Novas for chronic respiratory failure in the setting of OHS/OSA, Sarcoidosis and secondary PAH. Has a long standing h/o tobacco abuse and medical non-adherence.She has been doing well from a trach stand-point. I saw her last on 8/31. Here for routine trach change.   Review of Systems:  Bolds are positive  Constitutional: weight loss, gain, night sweats, Fevers, chills, fatigue .  HEENT: headaches, Sore throat, sneezing, nasal congestion, post nasal drip, Difficulty swallowing, Tooth/dental problems, visual complaints visual changes, ear ache CV: chest pain, radiates: ,Orthopnea, PND, swelling in lower extremities, dizziness, palpitations, syncope.  GI heartburn, indigestion, abdominal pain, nausea, vomiting, diarrhea, change in bowel habits, loss of appetite, bloody stools.  Resp: cough, productive: , hemoptysis, dyspnea, chest pain, pleuritic.  Skin: rash or itching or icterus GU: dysuria, change in color of urine, urgency or frequency. flank pain, hematuria  MS: joint pain or swelling. decreased range of motion  Psych: change in mood or affect. depression or anxiety.  Neuro: difficulty with speech, weakness, numbness, ataxia    Exam 163/82, pulse 92, RR14, Sat 94% on room air  General appearance: 45 year old; no distress  Mouth: membranes and no mucosal ulcerations; normal hard and soft palate Neck: Trachea midline, # 4 trach, uncuffed trach stoma unremarkable w/out any ulcerations  Lungs: CTA, with normal respiratory effort and no intercostal retractions CV: RRR, no MRGs  Abdomen: Soft, non-tender; no masses or HSM Extremities: No peripheral edema or extremity lymphadenopathy Skin: Normal temperature, turgor and texture; no rash, ulcers or subcutaneous nodules Psych: Appropriate affect, alert and oriented to person, place and time  Impression  45 year old with morbid obesity, OHS/OSA,  sarcoid, secondary PAH and heart failure. Trached emergently for hypercarbic respiratory failure.  >she has been thriving w/ #4 trach. Has had recent PSG which did indeed demonstrate moderate OSA w/ PSG completed w/ capped trach. There was minimal desaturation.   Plan Keep trach for now Will d/w Dr Melvyn Novas; she would need to have CPAP titration study w/ trach capped in order to consider decannulation.  If no decannulation then will see her again in 3 months.  IF decides to go forward w/ decannulation we can see her as soon as CPAP is set up; however she does have a difficult airway so think that the decision to decannulate should take that into consideration.   [name] ACNP-BC [place] Pager # [phone] OR # [phone] if no answer

## 2015-06-16 NOTE — Progress Notes (Signed)
Tracheostomy Procedure Note  CYONNA LAIDIG [id number] 12-19-1970  Pre Procedure Tracheostomy Information  Trach Brand: Shiley Size: 4.0 cuffless Style: Uncuffed Secured by: Velcro   Procedure: Seen pt. In trach clinic for routine trach change, all vitals good BP 163/82, pulse 92, RR14, Sat 94% on room air    Post Procedure Tracheostomy Information  Trach Brand: Shiley Size: 4.0 cuffless Style: Uncuffed Secured by: Velcro   Post Procedure Evaluation:  ETCO2 positive color change from yellow to purple : Yes.   Vital signs:blood pressure 163/87, pulse 87, respirations14 and pulse oximetry 100 % Patients current condition: stable Complications: No apparent complications Trach site exam: clean, dry Wound care done: 4 x 4 gauze Patient did tolerate procedure well.   Education: none  Prescription needs:none    Additional needs Pt. Is to return for follow up in 3 months

## 2015-08-16 ENCOUNTER — Telehealth: Payer: Self-pay | Admitting: Cardiology

## 2015-08-16 MED ORDER — TORSEMIDE 20 MG PO TABS: 20.0000 mg | ORAL_TABLET | Freq: Every day | ORAL | Status: DC

## 2015-08-16 NOTE — Telephone Encounter (Signed)
Rx(s) sent to pharmacy electronically.  

## 2015-08-16 NOTE — Telephone Encounter (Signed)
New Message   *STAT* If patient is at the pharmacy, call can be transferred to refill team.   1. Which medications need to be refilled? (please list name of each medication and dose if known) torsemide (DEMADEX) 20 MG tablet  2. Which pharmacy/location (including street and city if local pharmacy) is medication to be sent to? [facility] on [address] 3. Do they need a 30 day or 90 day supply? 90 day supply

## 2015-08-19 ENCOUNTER — Ambulatory Visit (INDEPENDENT_AMBULATORY_CARE_PROVIDER_SITE_OTHER): Payer: Medicare Other | Admitting: Pulmonary Disease

## 2015-08-19 ENCOUNTER — Encounter: Payer: Self-pay | Admitting: Pulmonary Disease

## 2015-08-19 VITALS — BP 134/79 | HR 110 | Temp 98.2°F | Ht 61.0 in | Wt 207.2 lb

## 2015-08-19 DIAGNOSIS — I129 Hypertensive chronic kidney disease with stage 1 through stage 4 chronic kidney disease, or unspecified chronic kidney disease: Secondary | ICD-10-CM

## 2015-08-19 DIAGNOSIS — E113299 Type 2 diabetes mellitus with mild nonproliferative diabetic retinopathy without macular edema, unspecified eye: Secondary | ICD-10-CM | POA: Diagnosis not present

## 2015-08-19 DIAGNOSIS — K769 Liver disease, unspecified: Secondary | ICD-10-CM

## 2015-08-19 DIAGNOSIS — K7689 Other specified diseases of liver: Secondary | ICD-10-CM | POA: Diagnosis not present

## 2015-08-19 DIAGNOSIS — I10 Essential (primary) hypertension: Secondary | ICD-10-CM

## 2015-08-19 DIAGNOSIS — E1122 Type 2 diabetes mellitus with diabetic chronic kidney disease: Secondary | ICD-10-CM | POA: Diagnosis not present

## 2015-08-19 DIAGNOSIS — Z794 Long term (current) use of insulin: Secondary | ICD-10-CM

## 2015-08-19 DIAGNOSIS — E1165 Type 2 diabetes mellitus with hyperglycemia: Secondary | ICD-10-CM | POA: Diagnosis not present

## 2015-08-19 DIAGNOSIS — N183 Chronic kidney disease, stage 3 unspecified: Secondary | ICD-10-CM

## 2015-08-19 LAB — GLUCOSE, CAPILLARY: Glucose-Capillary: 165 mg/dL — ABNORMAL HIGH (ref 65–99)

## 2015-08-19 LAB — POCT GLYCOSYLATED HEMOGLOBIN (HGB A1C): Hemoglobin A1C: 7.6

## 2015-08-19 MED ORDER — LIRAGLUTIDE 18 MG/3ML ~~LOC~~ SOPN: 0.6000 mg | PEN_INJECTOR | Freq: Every day | SUBCUTANEOUS | Status: DC

## 2015-08-19 MED ORDER — PEN NEEDLES 31G X 5 MM MISC: 1.0000 [IU] | Freq: Every day | Status: DC

## 2015-08-19 MED ORDER — GLUCOSE BLOOD VI STRP: ORAL_STRIP | Status: DC

## 2015-08-19 NOTE — Progress Notes (Signed)
Subjective:    Patient ID: Melody Gray, female    DOB: [birth year], 45 y.o.   MRN: [id number]  HPI [name] is a 45 year old woman with sarcoidosis, chronic respiratory failure with hypoxemia on home 2L O2, HTN, nonischemic cardiomyopathy, OSA, DM presenting for follow up of her DM.   She is doing well. Does not check blood sugar every day. No episodes of hypoglycemia.   Review of Systems Constitutional: no fevers/chills Ears, nose, mouth, throat, and face: some cough Respiratory: no shortness of breath Cardiovascular: no chest pain Gastrointestinal: no nausea/vomiting, no abdominal pain, no constipation, no diarrhea Genitourinary: no dysuria Integument: no rash Hematologic/lymphatic: no edema  Past Medical History  Diagnosis Date  . Sarcoidosis ([place])     Followed by Dr. Melvyn Novas; w/ liver involvement per biopsy 12/09, Reversible airway component so started on [facility] 01/2010; HFA 75% p coaching 05/2010  . Hypoxemia     CT angiogram 9/11>> No PE; PFTs 10/11- FEV1 1.20 (49%) with 16% better p B2, DLCO 33%> corrects to 84; O2 sats ok on 4 lpm X rapid walk X 3 laps 05/2010  . Morbid obesity ([place])     Target wt= 153 for BMI <30  . QT prolongation   . Essential hypertension   . Hx of cardiac cath 2/08    No CAD, no RAS,  normal EF  . Seborrheic dermatitis of scalp   . 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 Hep B serology neg. but Hep C ab +, HIV neg. 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 Cardiomyopathy, nonischemic ([place])     a. Varying EF over the years - initially 35% in 09/2010. Normal cors 12/2010. b. Echo 07/2014: EF 55-60%, no RWMA, + diastolic flattening and systolic flattening c/w RV volume and pressure overload, mild LAE/RAE, mod dilated RV, mod TR, PASP severely increased at 20mmHg. c. RHC 07/2014: moderate pulmonary HTN likely WHO group 3 with markedly elevated CVP and relatively normal left  sided pressures.  . Diabetic retinopathy     Right eye 2/11  . Health maintenance examination     Mammogram 05/2010 Negative; Last Pap smear 03/2008; Last DM eye exam 2/11> mild non-proliferative diabetic retinopathy. OD  . Helicobacter pylori ab+ 05/2011    Pt was symptomatic and treatment planned for 05/2011  . Chronic diastolic CHF (congestive heart failure) ([place])   . Complication of anesthesia     " difficult waking "  . Sleep apnea   . Diabetes mellitus     insulin dependent  . Acute bronchitis 07/29/2014  . Pulmonary hypertension ([place])   . Anemia   . CKD (chronic kidney disease), stage II   . Hypomagnesemia   . Long term current use of systemic steroids   . Obesity hypoventilation syndrome ([place])   . Chronic respiratory failure ([place])     a. became O2 dependent in July 2011. b. She required trache placement in 07/2013 and has been followed by pulmonology as well.  . Tobacco abuse   . Hypokalemia     Current Outpatient Prescriptions on File Prior to Visit  Medication Sig Dispense Refill  . albuterol (PROVENTIL HFA;VENTOLIN HFA) 108 (90 BASE) MCG/ACT inhaler Inhale 2 puffs into the lungs every 6 (six) hours as needed for wheezing or shortness of breath. 1 Inhaler 2  . atorvastatin (LIPITOR) 20 MG tablet Take 1 tablet (20 mg  total) by mouth daily at 6 PM. 90 tablet 3  . B-D INS SYRINGE 0.5CC/30GX1/2" 30G X 1/2" 0.5 ML MISC USE AS DIRECTED 100 each 1  . carvedilol (COREG) 12.5 MG tablet Take 1 tablet by mouth at bedtime.  0  . famotidine (PEPCID) 20 MG tablet Take 1 tablet (20 mg total) by mouth daily. 30 tablet 0  . ferrous sulfate 325 (65 FE) MG tablet Take 1 tablet (325 mg total) by mouth 3 (three) times daily with meals. 90 tablet 3  . fluticasone (FLONASE) 50 MCG/ACT nasal spray Place 2 sprays into both nostrils daily. 16 g 2  . glucose blood test strip 1 each by Other route as needed for other. Use to check blood sugar 3 to 4 times daily. diag code E11.65. Insulin dependent 120  each 6  . insulin aspart (NOVOLOG) 100 UNIT/ML injection Take novolog per sliding scale: CBG 70-120: 0 units  CBG 121-150: 1 unit  CBG 151-200: 2 units  CBG 201-300: 5 units  CBG 301-350: 7 units  CBG 351-400: 9 units  CBG > 400: call MD 30 mL 3  . insulin detemir (LEVEMIR) 100 UNIT/ML injection Inject 0.46 mLs (46 Units total) into the skin at bedtime. 40 mL 3  . Melatonin 3 MG TABS Take 1 tablet (3 mg total) by mouth at bedtime as needed.  0  . meloxicam (MOBIC) 15 MG tablet Take 1 tablet (15 mg total) by mouth daily. 7 tablet 0  . metFORMIN (GLUCOPHAGE) 1000 MG tablet take 1 tablet by mouth twice a day with meals 180 tablet 4  . potassium chloride SA (K-DUR,KLOR-CON) 20 MEQ tablet Take 1 tablet (20 mEq total) by mouth daily. 30 tablet 3  . predniSONE (DELTASONE) 5 MG tablet Take 1 tablet (5 mg total) by mouth every other day. 30 tablet 3  . torsemide (DEMADEX) 20 MG tablet Take 1 tablet (20 mg total) by mouth daily. MUST KEEP APPOINTMENT 08/30/15 WITH BRYAN HAGER,PA FOR FUTURE REFILLS OR 90-DAY SUPPLY 30 tablet 0   No current facility-administered medications on file prior to visit.   Objective:   Physical Exam Blood pressure 134/79, pulse 110, temperature 98.2 F (36.8 C), temperature source Oral, height 5\' 1"  (1.549 m), weight 207 lb 3.2 oz (93.985 kg), SpO2 98 %. General Apperance: NAD HEENT: Normocephalic, atraumatic, anicteric sclera Neck: Supple, trach in place, capped Lungs: Clear to auscultation bilaterally. No wheezes, rhonchi or rales. Breathing comfortably Heart: Regular rate and rhythm, no murmur/rub/gallop Abdomen: Soft, nontender, nondistended, no rebound/guarding Extremities: Warm and well perfused, no edema Skin: No rashes or lesions Neurologic: Alert and interactive. No gross deficits.    Assessment & Plan:  Please refer to problem based charting.

## 2015-08-19 NOTE — Patient Instructions (Addendum)
Start taking liraglutide 0.6 mg once daily for 1 week; then increase to 1.2 mg once daily.  Check your blood sugar at least once a day when you wake up/before breakfast. If your blood sugar is below 150, decrease your Levemir by 2 units.  Please follow up in 1-2 months. Bring your meter to your appointment.

## 2015-08-20 LAB — CMP14 + ANION GAP
ALT: 36 IU/L — ABNORMAL HIGH (ref 0–32)
AST: 34 IU/L (ref 0–40)
Albumin/Globulin Ratio: 0.9 — ABNORMAL LOW (ref 1.2–2.2)
Albumin: 3.9 g/dL (ref 3.5–5.5)
Alkaline Phosphatase: 252 IU/L — ABNORMAL HIGH (ref 39–117)
Anion Gap: 19 mmol/L — ABNORMAL HIGH (ref 10.0–18.0)
BUN/Creatinine Ratio: 20 (ref 9–23)
BUN: 26 mg/dL — ABNORMAL HIGH (ref 6–24)
Bilirubin Total: 0.4 mg/dL (ref 0.0–1.2)
CO2: 23 mmol/L (ref 18–29)
Calcium: 9.7 mg/dL (ref 8.7–10.2)
Chloride: 95 mmol/L — ABNORMAL LOW (ref 96–106)
Creatinine, Ser: 1.31 mg/dL — ABNORMAL HIGH (ref 0.57–1.00)
GFR calc Af Amer: 56 mL/min/{1.73_m2} — ABNORMAL LOW (ref >59)
GFR calc non Af Amer: 49 mL/min/{1.73_m2} — ABNORMAL LOW (ref >59)
Globulin, Total: 4.2 g/dL (ref 1.5–4.5)
Glucose: 157 mg/dL — ABNORMAL HIGH (ref 65–99)
Potassium: 4.6 mmol/L (ref 3.5–5.2)
Sodium: 137 mmol/L (ref 134–144)
Total Protein: 8.1 g/dL (ref 6.0–8.5)

## 2015-08-20 LAB — LIPID PANEL
Chol/HDL Ratio: 6 ratio units — ABNORMAL HIGH (ref 0.0–4.4)
Cholesterol, Total: 277 mg/dL — ABNORMAL HIGH (ref 100–199)
HDL: 46 mg/dL (ref >39)
LDL Calculated: 154 mg/dL — ABNORMAL HIGH (ref 0–99)
Triglycerides: 387 mg/dL — ABNORMAL HIGH (ref 0–149)
VLDL Cholesterol Cal: 77 mg/dL — ABNORMAL HIGH (ref 5–40)

## 2015-08-20 LAB — MICROALBUMIN / CREATININE URINE RATIO
Creatinine, Urine: 18.1 mg/dL
MICROALB/CREAT RATIO: 1137.6 mg/g creat — ABNORMAL HIGH (ref 0.0–30.0)
Microalbumin, Urine: 205.9 ug/mL

## 2015-08-20 NOTE — Assessment & Plan Note (Signed)
Needs further workup of possible cirrhosis. Alk phos 252. Bilirubin within normal limits. AST 34 and ALT 36.  Will discuss with patient at follow up repeat US RUQ to start.

## 2015-08-20 NOTE — Assessment & Plan Note (Signed)
Cr at baseline of 1.2 to 1.3. Cr clearance around 56.  Continue to monitor.

## 2015-08-20 NOTE — Assessment & Plan Note (Signed)
A1c improved from 8.2 to 7.6  Continue Levemir 46u daily Add liraglutide 1.2mg  daily. Will titrate insulin down. Continue metformin 1000mg  BID Microalbumin/cr ratio 1137.6. Consider starting lisinopril at follow up.

## 2015-08-20 NOTE — Assessment & Plan Note (Signed)
Assessment: BP 134/79, at goal  Plan: Continue coreg 12.5 daily and torsemide 20mg  daily

## 2015-08-23 NOTE — Progress Notes (Signed)
Medicine attending: Medical history, presenting problems, physical findings, and medications, reviewed with resident physician Dr Jennifer Krall on the day of the patient visit and I concur with her evaluation and management plan. 

## 2015-08-26 NOTE — Addendum Note (Signed)
Addended by: Jacques Earthly T on: 08/26/2015 10:54 AM   Modules accepted: Miquel Dunn

## 2015-08-30 ENCOUNTER — Ambulatory Visit (INDEPENDENT_AMBULATORY_CARE_PROVIDER_SITE_OTHER): Payer: Medicare Other | Admitting: Physician Assistant

## 2015-08-30 ENCOUNTER — Encounter: Payer: Self-pay | Admitting: Physician Assistant

## 2015-08-30 VITALS — BP 120/86 | HR 111 | Ht 61.0 in | Wt 201.0 lb

## 2015-08-30 DIAGNOSIS — I5032 Chronic diastolic (congestive) heart failure: Secondary | ICD-10-CM | POA: Diagnosis not present

## 2015-08-30 DIAGNOSIS — I10 Essential (primary) hypertension: Secondary | ICD-10-CM | POA: Diagnosis not present

## 2015-08-30 DIAGNOSIS — D869 Sarcoidosis, unspecified: Secondary | ICD-10-CM | POA: Diagnosis not present

## 2015-08-30 DIAGNOSIS — I428 Other cardiomyopathies: Secondary | ICD-10-CM

## 2015-08-30 DIAGNOSIS — Z720 Tobacco use: Secondary | ICD-10-CM | POA: Diagnosis not present

## 2015-08-30 DIAGNOSIS — I429 Cardiomyopathy, unspecified: Secondary | ICD-10-CM

## 2015-08-30 MED ORDER — CARVEDILOL 3.125 MG PO TABS: 3.1250 mg | ORAL_TABLET | Freq: Two times a day (BID) | ORAL | Status: DC

## 2015-08-30 NOTE — Progress Notes (Signed)
Patient ID: Melody Gray, female   DOB: 1970-12-27, 45 y.o.   MRN: [id number]    Date:  08/30/2015   ID:  Melody Gray, DOB 1970-12-11, MRN [id number]  PCP:  Jacques Earthly, MD  Primary Cardiologist:  [facility]  Chief Complaint  Patient presents with  . Follow-up    asymptomatic today     History of Present Illness: Melody Gray is a 45 y.o. obese female with a history of sarcoidosis followed by Dr. Melvyn Novas, tracheostomy, QT prolongation, essential hypertension, nonischemic cardiomyopathy chronic diastolic heart failure, sleep apnea, diabetes mellitus, pulmonary hypertension, chronic kidney disease, tobacco abuse-ongoing.  She is an ejection fraction as low as 35%. Normal coronary arteries September 2012. Her most recent echocardiogram was April 2016 and her ejection fraction was 55-60% with normal ventricular diastolic parameters. Peak PA pressure of 83 mmHg. Moderate tricuspid valve regurgitation. The right and left atrium are mildly dilated.    Patient presents for routine evaluation. She has not been seen in quite some years as there are no epic notes other than what was in the hospital. She reports doing well. She's lost 28 pounds as a result of eating smaller portions making better choices and exercising more. She says is not dependent on oxygen anymore. She's not been taken her Coreg several months. She does continue to smoke. She reports some nausea as a result of taking Liraglutide.  The patient currently denies  vomiting, fever, chest pain, shortness of breath, orthopnea, dizziness, PND, cough, congestion, abdominal pain, hematochezia, melena, lower extremity edema, claudication.  Wt Readings from Last 3 Encounters:  08/30/15 201 lb (91.173 kg)  08/19/15 207 lb 3.2 oz (93.985 kg)  05/10/15 230 lb (104.327 kg)     Past Medical History  Diagnosis Date  . Sarcoidosis ([place])     Followed by Dr. Melvyn Novas; w/ liver involvement per biopsy 12/09, Reversible airway component so started on  [facility] 01/2010; HFA 75% p coaching 05/2010  . Hypoxemia     CT angiogram 9/11>> No PE; PFTs 10/11- FEV1 1.20 (49%) with 16% better p B2, DLCO 33%> corrects to 84; O2 sats ok on 4 lpm X rapid walk X 3 laps 05/2010  . Morbid obesity ([place])     Target wt= 153 for BMI <30  . QT prolongation   . Essential hypertension   . Hx of cardiac cath 2/08    No CAD, no RAS,  normal EF  . Seborrheic dermatitis of scalp   . 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 Hep B serology neg. but Hep C ab +, HIV neg. 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 Cardiomyopathy, nonischemic ([place])     a. Varying EF over the years - initially 35% in 09/2010. Normal cors 12/2010. b. Echo 07/2014: EF 55-60%, no RWMA, + diastolic flattening and systolic flattening c/w RV volume and pressure overload, mild LAE/RAE, mod dilated RV, mod TR, PASP severely increased at 24mmHg. c. RHC 07/2014: moderate pulmonary HTN likely WHO group 3 with markedly elevated CVP and relatively normal left sided pressures.  . Diabetic retinopathy     Right eye 2/11  . Health maintenance examination     Mammogram 05/2010 Negative; Last Pap smear 03/2008; Last DM eye exam 2/11> mild non-proliferative diabetic retinopathy. OD  . Helicobacter pylori ab+ 05/2011    Pt was symptomatic and treatment planned for 05/2011  . Chronic diastolic  CHF (congestive heart failure) ([place])   . Complication of anesthesia     " difficult waking "  . Sleep apnea   . Diabetes mellitus     insulin dependent  . Acute bronchitis 07/29/2014  . Pulmonary hypertension ([place])   . Anemia   . CKD (chronic kidney disease), stage II   . Hypomagnesemia   . Long term current use of systemic steroids   . Obesity hypoventilation syndrome ([place])   . Chronic respiratory failure ([place])     a. became O2 dependent in July 2011. b. She required trache placement in 07/2013 and has been followed by pulmonology as well.  .  Tobacco abuse   . Hypokalemia     Current Outpatient Prescriptions  Medication Sig Dispense Refill  . albuterol (PROVENTIL HFA;VENTOLIN HFA) 108 (90 BASE) MCG/ACT inhaler Inhale 2 puffs into the lungs every 6 (six) hours as needed for wheezing or shortness of breath. 1 Inhaler 2  . B-D INS SYRINGE 0.5CC/30GX1/2" 30G X 1/2" 0.5 ML MISC USE AS DIRECTED 100 each 1  . fluticasone (FLONASE) 50 MCG/ACT nasal spray Place 2 sprays into both nostrils daily. 16 g 2  . glucose blood test strip Use to check blood sugar 3 times daily. Dx code E11.65. Insulin dependent 120 each 6  . insulin detemir (LEVEMIR) 100 UNIT/ML injection Inject 0.46 mLs (46 Units total) into the skin at bedtime. 40 mL 3  . Insulin Pen Needle (PEN NEEDLES) 31G X 5 MM MISC Inject 1 Units into the skin daily. Use to inject liraglutide daily. 100 each 1  . Liraglutide 18 MG/3ML SOPN Inject 0.1 mLs (0.6 mg total) into the skin daily. For 1 week. Then increase to 1.2 mg once daily. 6 mL 2  . metFORMIN (GLUCOPHAGE) 1000 MG tablet take 1 tablet by mouth twice a day with meals 180 tablet 4  . torsemide (DEMADEX) 20 MG tablet Take 1 tablet (20 mg total) by mouth daily. MUST KEEP APPOINTMENT 08/30/15 WITH Tanyia Grabbe,PA FOR FUTURE REFILLS OR 90-DAY SUPPLY 30 tablet 0  . carvedilol (COREG) 3.125 MG tablet Take 1 tablet (3.125 mg total) by mouth 2 (two) times daily. 180 tablet 3   No current facility-administered medications for this visit.    Allergies:    Allergies  Allergen Reactions  . Vicodin [Hydrocodone-Acetaminophen] Itching    Social History:  The patient  reports that she has quit smoking. Her smoking use included Cigarettes. She started smoking about 10 months ago. She has a 6.6 pack-year smoking history. She has never used smokeless tobacco. She reports that she does not drink alcohol or use illicit drugs.   Family history:   Family History  Problem Relation Age of Onset  . Cancer Mother     colon cancer  . Multiple  sclerosis Father   . Asthma Sister     in childhood  . Diabetes Father   . Hypertension      ROS:  Please see the history of present illness.  All other systems reviewed and negative.   PHYSICAL EXAM: VS:  BP 120/86 mmHg  Pulse 111  Ht 5\' 1"  (1.549 m)  Wt 201 lb (91.173 kg)  BMI 38.00 kg/m2  LMP 08/02/2015 Obese well developed, in no acute distress HEENT: Pupils are equal round react to light accommodation extraocular movements are intact.  Neck: no JVDNo cervical lymphadenopathy. Trach tube in place. Cardiac: Regular rate and rhythm without murmurs rubs or gallops. Lungs:  clear to auscultation bilaterally, no wheezing,  rhonchi or rales Abd: soft, nontender, positive bowel sounds all quadrants,  Ext: no lower extremity edema.  2+ radial and dorsalis pedis pulses. Skin: warm and dry Neuro:  Grossly normal  EKG:  Sinus tachycardia rate 111 bpm  ASSESSMENT AND PLAN:  Problem List Items Addressed This Visit    Tobacco abuse   Sarcoidosis ([place])   Essential hypertension - Primary   Relevant Medications   carvedilol (COREG) 3.125 MG tablet   Other Relevant Orders   EKG 12-Lead   Chronic diastolic heart failure, grade 2   Relevant Medications   carvedilol (COREG) 3.125 MG tablet   Other Relevant Orders   EKG 12-Lead   Cardiomyopathy, nonischemic (HCC)   Relevant Medications   carvedilol (COREG) 3.125 MG tablet      Ms Shirkey presented for routine evaluation. She had not been seen in several years.  She has history of nonischemic cardiomyopathy, sarcoidosis, chronic diastolic heart failure. Her last echocardiogram April of last year revealed normal diastolic parameters and normal ejection fraction, severe pulmonary hypertension. She appears euvolemic today on exam. Blood pressure is controlled today.  She has been off her Coreg 12.5 mg for several months because she ran out. I'll restart her at 3.125 twice daily.  She's lost 28 pounds in the last four months eating smaller  portions and starting to walk more. I encouraged her to keep the good work. Follow-up 3 months

## 2015-08-30 NOTE — Patient Instructions (Signed)
Your physician wants you to follow-up in: 3 Months with Dr Stanford Breed. You will receive a reminder letter in the mail two months in advance. If you don't receive a letter, please call our office to schedule the follow-up appointment.  Your physician has recommended you make the following change in your medication: Decrease Carvedilol 3.125 mg twice a day

## 2015-08-31 DIAGNOSIS — E119 Type 2 diabetes mellitus without complications: Secondary | ICD-10-CM | POA: Diagnosis not present

## 2015-08-31 DIAGNOSIS — H04123 Dry eye syndrome of bilateral lacrimal glands: Secondary | ICD-10-CM | POA: Diagnosis not present

## 2015-08-31 LAB — HM DIABETES EYE EXAM

## 2015-09-02 ENCOUNTER — Encounter: Payer: Self-pay | Admitting: Pulmonary Disease

## 2015-09-02 ENCOUNTER — Other Ambulatory Visit: Payer: Self-pay | Admitting: *Deleted

## 2015-09-02 ENCOUNTER — Encounter: Payer: Self-pay | Admitting: *Deleted

## 2015-09-02 MED ORDER — MELOXICAM 7.5 MG PO TABS: 7.5000 mg | ORAL_TABLET | Freq: Every day | ORAL | Status: DC

## 2015-09-02 NOTE — Telephone Encounter (Signed)
She will need appt for tramadol. Can try Mobic for now to see if that helps.

## 2015-09-02 NOTE — Telephone Encounter (Signed)
Via my chart message, patient requesting these medicines. Both d/c'd from active med list.

## 2015-09-10 ENCOUNTER — Encounter: Payer: Self-pay | Admitting: *Deleted

## 2015-09-16 ENCOUNTER — Encounter: Payer: Medicare Other | Admitting: Pulmonary Disease

## 2015-09-23 ENCOUNTER — Ambulatory Visit (INDEPENDENT_AMBULATORY_CARE_PROVIDER_SITE_OTHER): Payer: Medicare Other | Admitting: Pulmonary Disease

## 2015-09-23 ENCOUNTER — Encounter: Payer: Self-pay | Admitting: Pulmonary Disease

## 2015-09-23 VITALS — BP 134/78 | HR 99 | Temp 98.1°F | Ht 61.0 in | Wt 204.1 lb

## 2015-09-23 DIAGNOSIS — Z794 Long term (current) use of insulin: Secondary | ICD-10-CM

## 2015-09-23 DIAGNOSIS — E1165 Type 2 diabetes mellitus with hyperglycemia: Principal | ICD-10-CM

## 2015-09-23 DIAGNOSIS — K769 Liver disease, unspecified: Secondary | ICD-10-CM

## 2015-09-23 DIAGNOSIS — K7460 Unspecified cirrhosis of liver: Secondary | ICD-10-CM | POA: Diagnosis not present

## 2015-09-23 DIAGNOSIS — E119 Type 2 diabetes mellitus without complications: Secondary | ICD-10-CM | POA: Diagnosis present

## 2015-09-23 DIAGNOSIS — E113299 Type 2 diabetes mellitus with mild nonproliferative diabetic retinopathy without macular edema, unspecified eye: Secondary | ICD-10-CM

## 2015-09-23 DIAGNOSIS — K7689 Other specified diseases of liver: Secondary | ICD-10-CM

## 2015-09-23 DIAGNOSIS — I10 Essential (primary) hypertension: Secondary | ICD-10-CM

## 2015-09-23 MED ORDER — LISINOPRIL 2.5 MG PO TABS: 2.5000 mg | ORAL_TABLET | Freq: Every day | ORAL | Status: DC

## 2015-09-23 MED ORDER — GLUCOSE BLOOD VI STRP: ORAL_STRIP | Status: DC

## 2015-09-23 NOTE — Patient Instructions (Signed)
Start taking lisinopril daily Please get the ultrasound of your liver done. Follow up in 4-6 weeks.

## 2015-09-23 NOTE — Progress Notes (Signed)
Subjective:    Patient ID: Melody Gray, female    DOB: [birth year], 45 y.o.   MRN: [id number]  HPI [name] is a 45 year old woman with sarcoidosis, chronic respiratory failure with hypoxemia on home 2L O2, HTN, nonischemic cardiomyopathy, OSA, DM presenting for follow up of her DM.   Has not been able to get strips for her meter. No episodes of hypoglycemia.  Review of Systems Constitutional: no fevers/chills Ears, nose, mouth, throat, and face: no cough Respiratory: no shortness of breath Cardiovascular: no chest pain Gastrointestinal: no nausea/vomiting, no abdominal pain, no diarrhea Genitourinary: no dysuria  Past Medical History  Diagnosis Date  . Sarcoidosis ([place])     Followed by Dr. Melvyn Novas; w/ liver involvement per biopsy 12/09, Reversible airway component so started on [facility] 01/2010; HFA 75% p coaching 05/2010  . Hypoxemia     CT angiogram 9/11>> No PE; PFTs 10/11- FEV1 1.20 (49%) with 16% better p B2, DLCO 33%> corrects to 84; O2 sats ok on 4 lpm X rapid walk X 3 laps 05/2010  . Morbid obesity ([place])     Target wt= 153 for BMI <30  . QT prolongation   . Essential hypertension   . Hx of cardiac cath 2/08    No CAD, no RAS,  normal EF  . Seborrheic dermatitis of scalp   . 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 Hep B serology neg. but Hep C ab +, HIV neg. 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 Cardiomyopathy, nonischemic ([place])     a. Varying EF over the years - initially 35% in 09/2010. Normal cors 12/2010. b. Echo 07/2014: EF 55-60%, no RWMA, + diastolic flattening and systolic flattening c/w RV volume and pressure overload, mild LAE/RAE, mod dilated RV, mod TR, PASP severely increased at 65mmHg. c. RHC 07/2014: moderate pulmonary HTN likely WHO group 3 with markedly elevated CVP and relatively normal left sided pressures.  . Diabetic retinopathy     Right eye 2/11  . Health maintenance  examination     Mammogram 05/2010 Negative; Last Pap smear 03/2008; Last DM eye exam 2/11> mild non-proliferative diabetic retinopathy. OD  . Helicobacter pylori ab+ 05/2011    Pt was symptomatic and treatment planned for 05/2011  . Chronic diastolic CHF (congestive heart failure) ([place])   . Complication of anesthesia     " difficult waking "  . Sleep apnea   . Diabetes mellitus     insulin dependent  . Acute bronchitis 07/29/2014  . Pulmonary hypertension ([place])   . Anemia   . CKD (chronic kidney disease), stage II   . Hypomagnesemia   . Long term current use of systemic steroids   . Obesity hypoventilation syndrome ([place])   . Chronic respiratory failure ([place])     a. became O2 dependent in July 2011. b. She required trache placement in 07/2013 and has been followed by pulmonology as well.  . Tobacco abuse   . Hypokalemia     Current Outpatient Prescriptions on File Prior to Visit  Medication Sig Dispense Refill  . albuterol (PROVENTIL HFA;VENTOLIN HFA) 108 (90 BASE) MCG/ACT inhaler Inhale 2 puffs into the lungs every 6 (six) hours as needed for wheezing or shortness of breath. 1 Inhaler 2  . B-D INS SYRINGE 0.5CC/30GX1/2" 30G X 1/2" 0.5 ML MISC USE AS DIRECTED 100 each 1  . carvedilol (COREG)  3.125 MG tablet Take 1 tablet (3.125 mg total) by mouth 2 (two) times daily. 180 tablet 3  . fluticasone (FLONASE) 50 MCG/ACT nasal spray Place 2 sprays into both nostrils daily. 16 g 2  . insulin detemir (LEVEMIR) 100 UNIT/ML injection Inject 0.46 mLs (46 Units total) into the skin at bedtime. 40 mL 3  . Insulin Pen Needle (PEN NEEDLES) 31G X 5 MM MISC Inject 1 Units into the skin daily. Use to inject liraglutide daily. 100 each 1  . Liraglutide 18 MG/3ML SOPN Inject 0.1 mLs (0.6 mg total) into the skin daily. For 1 week. Then increase to 1.2 mg once daily. 6 mL 2  . meloxicam (MOBIC) 7.5 MG tablet Take 1 tablet (7.5 mg total) by mouth daily. 30 tablet 0  . metFORMIN (GLUCOPHAGE) 1000 MG tablet take 1  tablet by mouth twice a day with meals 180 tablet 4  . torsemide (DEMADEX) 20 MG tablet Take 1 tablet (20 mg total) by mouth daily. MUST KEEP APPOINTMENT 08/30/15 WITH BRYAN HAGER,PA FOR FUTURE REFILLS OR 90-DAY SUPPLY 30 tablet 0   No current facility-administered medications on file prior to visit.   Objective:   Physical Exam Blood pressure 134/78, pulse 99, temperature 98.1 F (36.7 C), temperature source Oral, height 5\' 1"  (1.549 m), weight 204 lb 1.6 oz (92.579 kg), last menstrual period 08/02/2015, SpO2 100 %. General Apperance: NAD HEENT: Normocephalic, atraumatic, anicteric sclera Neck: Supple, trach in place, capped Lungs: Clear to auscultation bilaterally. No wheezes, rhonchi or rales. Breathing comfortably Heart: Regular rate and rhythm, no murmur/rub/gallop Abdomen: Soft, nontender, nondistended, no rebound/guarding Extremities: Warm and well perfused, no edema Skin: No rashes or lesions Neurologic: Alert and interactive. No gross deficits.    Assessment & Plan:  Please refer to problem based charting.

## 2015-09-25 NOTE — Assessment & Plan Note (Signed)
Assessment: No episodes of symptoms of hypoglycemia. She has not been able to check her blood sugars  Plan: Continue Levemir 46u daily, metformin 1000mg  BID and liraglutide 1.2mg  daily Refilled strips Start lisinopril 2.5mg  daily

## 2015-09-25 NOTE — Assessment & Plan Note (Addendum)
Assessment: BP 134/78  Plan: Continue coreg 3.125mg  BID and torsemide 20mg  daily. Starting low dose lisinopril for diabetic nephropathy

## 2015-09-25 NOTE — Assessment & Plan Note (Signed)
Obtain US liver.

## 2015-09-30 NOTE — Progress Notes (Signed)
Internal Medicine Clinic Attending  Case discussed with Dr. Krall at the time of the visit.  We reviewed the resident's history and exam and pertinent patient test results.  I agree with the assessment, diagnosis, and plan of care documented in the resident's note.  

## 2015-10-01 MED ORDER — LIRAGLUTIDE 18 MG/3ML ~~LOC~~ SOPN: 1.2000 mg | PEN_INJECTOR | Freq: Every day | SUBCUTANEOUS | Status: DC

## 2015-10-01 MED ORDER — GLUCOSE BLOOD VI STRP: ORAL_STRIP | Status: DC

## 2015-10-01 MED ORDER — METFORMIN HCL 1000 MG PO TABS: 1000.0000 mg | ORAL_TABLET | Freq: Two times a day (BID) | ORAL | Status: DC

## 2015-10-01 NOTE — Addendum Note (Signed)
Addended by: Jacques Earthly T on: 10/01/2015 01:09 PM   Modules accepted: Orders, SmartSet

## 2015-10-11 ENCOUNTER — Other Ambulatory Visit: Payer: Self-pay | Admitting: *Deleted

## 2015-10-11 MED ORDER — TORSEMIDE 20 MG PO TABS: 20.0000 mg | ORAL_TABLET | Freq: Every day | ORAL | Status: DC

## 2015-10-11 NOTE — Telephone Encounter (Signed)
Rx request sent to pharmacy.  

## 2015-10-20 ENCOUNTER — Ambulatory Visit (HOSPITAL_COMMUNITY)
Admission: RE | Admit: 2015-10-20 | Discharge: 2015-10-20 | Disposition: A | Discharge status: Home / Self Care | Payer: Medicare Other | Source: Ambulatory Visit | Attending: Internal Medicine | Admitting: Internal Medicine

## 2015-10-20 DIAGNOSIS — K8020 Calculus of gallbladder without cholecystitis without obstruction: Secondary | ICD-10-CM | POA: Insufficient documentation

## 2015-10-20 DIAGNOSIS — K7689 Other specified diseases of liver: Secondary | ICD-10-CM | POA: Insufficient documentation

## 2015-10-20 DIAGNOSIS — K824 Cholesterolosis of gallbladder: Secondary | ICD-10-CM | POA: Insufficient documentation

## 2015-10-20 DIAGNOSIS — K769 Liver disease, unspecified: Secondary | ICD-10-CM

## 2015-10-20 DIAGNOSIS — R160 Hepatomegaly, not elsewhere classified: Secondary | ICD-10-CM | POA: Diagnosis not present

## 2015-11-04 ENCOUNTER — Encounter: Payer: Medicare Other | Admitting: Pulmonary Disease

## 2015-11-04 ENCOUNTER — Encounter: Payer: Self-pay | Admitting: Pulmonary Disease

## 2015-11-09 NOTE — Telephone Encounter (Unsigned)
This encounter was created in error - please disregard.

## 2015-11-10 ENCOUNTER — Ambulatory Visit (HOSPITAL_COMMUNITY): Payer: Medicare Other

## 2015-11-10 ENCOUNTER — Ambulatory Visit (HOSPITAL_COMMUNITY)
Admission: RE | Admit: 2015-11-10 | Discharge: 2015-11-10 | Disposition: A | Discharge status: Home / Self Care | Payer: Medicare Other | Source: Ambulatory Visit | Attending: Acute Care | Admitting: Acute Care

## 2015-11-10 DIAGNOSIS — D869 Sarcoidosis, unspecified: Secondary | ICD-10-CM | POA: Diagnosis not present

## 2015-11-10 DIAGNOSIS — Z930 Tracheostomy status: Secondary | ICD-10-CM

## 2015-11-10 DIAGNOSIS — E662 Morbid (severe) obesity with alveolar hypoventilation: Secondary | ICD-10-CM | POA: Insufficient documentation

## 2015-11-10 DIAGNOSIS — Z430 Encounter for attention to tracheostomy: Secondary | ICD-10-CM | POA: Diagnosis not present

## 2015-11-10 DIAGNOSIS — G4733 Obstructive sleep apnea (adult) (pediatric): Secondary | ICD-10-CM

## 2015-11-10 DIAGNOSIS — I272 Other secondary pulmonary hypertension: Secondary | ICD-10-CM | POA: Insufficient documentation

## 2015-11-10 DIAGNOSIS — Z9119 Patient's noncompliance with other medical treatment and regimen: Secondary | ICD-10-CM | POA: Insufficient documentation

## 2015-11-10 DIAGNOSIS — J9610 Chronic respiratory failure, unspecified whether with hypoxia or hypercapnia: Secondary | ICD-10-CM | POA: Insufficient documentation

## 2015-11-10 DIAGNOSIS — Z87891 Personal history of nicotine dependence: Secondary | ICD-10-CM | POA: Insufficient documentation

## 2015-11-10 DIAGNOSIS — I509 Heart failure, unspecified: Secondary | ICD-10-CM | POA: Diagnosis not present

## 2015-11-10 NOTE — Progress Notes (Signed)
Tracheostomy Procedure Note  NAYSA DONIS [id number] [birth year]  Pre Procedure Tracheostomy Information  Trach Brand: Shiley Size: 4.0 uncuffed Style: Uncuffed Secured by: Velcro   Procedure: Routine trach change an check, BP 123/84, RR 14, Sat 100% on RA, HR 93    Post Procedure Tracheostomy Information  Trach Brand: Shiley Size: 4.0 uncuffed Style: Uncuffed Secured by: Velcro   Post Procedure Evaluation:  ETCO2 positive color change from yellow to purple : Yes.   Vital signs:blood pressure 137/85, pulse  89, respirations 14 and pulse oximetry 100 % RA Patients current condition: stable Complications: No apparent complications Trach site exam: clean, dry, no drainage Wound care done: 4 x 4 gauze Patient did tolerate procedure well.   Education: none  Prescription needs: none    Additional needs: pass muir given to pt.

## 2015-11-15 DIAGNOSIS — G4733 Obstructive sleep apnea (adult) (pediatric): Secondary | ICD-10-CM | POA: Insufficient documentation

## 2015-11-15 DIAGNOSIS — T884XXA Failed or difficult intubation, initial encounter: Secondary | ICD-10-CM | POA: Insufficient documentation

## 2015-11-15 NOTE — Progress Notes (Signed)
Addendum  Spoke at length w/ Melody Gray via phone (on 7/17) we discussed at length the plan for decannulation but also the risk given her difficult airway. I do think that she would be risky to decannulate as she required emergent airway f/b trach revision back in 2014 when the trach was placed. I informed her that she would indeed still have sleep apnea after the trach was removed & that w/ the trach present the sleep apnea was not an issue. Also discussed the risk of decannulation should her breathing decompensate and perhaps she required intubation her risk of death or other complication would be high. Based on this and the fact that she has thrived since trach we have decided to keep the trach. I told her that I would be willing to re-consider this again in the future but she would need to accept the potential risk associated with this.    [name] ACNP-BC [place] Pager # [phone] OR # [phone] if no answer

## 2015-11-15 NOTE — Progress Notes (Signed)
Patient ID: Melody Gray, female    DOB: 05-Sep-1970, 45 y.o.   MRN: [id number]  CC: ROV  Subjective:  No new complaints. Wants to get trach out. Has been using trach cap during day w/out difficulty.    HPI This is a 45 year old female f/b Dr Melvyn Novas for chronic respiratory failure in the setting of OHS/OSA, Sarcoidosis and secondary PAH. Has a long standing h/o tobacco abuse and medical non-adherence.She has been doing well from a trach stand-point. I saw her last on 2/15. Here for routine trach change. Since our last visit she has been trying trach capping trials and tolerating these well. As mentioned during her last visit her most recent PSG showed moderate OSA. She has yet to have a CPAP titration study. She is asking about decannulation.    Review of Systems  Constitutional: Negative for fever, chills, diaphoresis, activity change, appetite change and fatigue.  HENT: Negative for congestion, dental problem, facial swelling, mouth sores, postnasal drip, sore throat, trouble swallowing and voice change.   Eyes: Negative.   Respiratory: Negative for apnea, cough, chest tightness, shortness of breath and wheezing.   Cardiovascular: Negative.   Gastrointestinal: Negative.   Endocrine: Negative.   Genitourinary: Negative.   Musculoskeletal: Negative.   Skin: Negative.   Allergic/Immunologic: Negative.   Neurological: Negative.   Hematological: Negative.   Psychiatric/Behavioral: Negative.    BP 123/84, RR 14, Sat 100% on RA, HR 93    Objective:   Physical Exam  Constitutional: She is oriented to person, place, and time. She appears well-developed and well-nourished. No distress.  HENT:  Head: Normocephalic.  Mouth/Throat: Oropharynx is clear and moist. No oropharyngeal exudate.  Eyes: EOM are normal. Pupils are equal, round, and reactive to light. Right eye exhibits no discharge. Left eye exhibits no discharge.  Neck: Normal range of motion. No tracheal deviation present.  #4  cuffless trach w/ PMV. Excellent phonation. Tolerated Finger occlusion. Occlusive trach placed and tolerated well. Actually feels "more comfortable"  Cardiovascular: Normal rate and regular rhythm.   Pulmonary/Chest: Effort normal and breath sounds normal. No stridor. No respiratory distress. She has no wheezes.  Abdominal: Soft. Bowel sounds are normal. She exhibits no distension. There is no tenderness.  Musculoskeletal: Normal range of motion. She exhibits no edema or tenderness.  Neurological: She is alert and oriented to person, place, and time.  Skin: Skin is warm and dry. No rash noted. She is not diaphoretic. No erythema.  Psychiatric: She has a normal mood and affect. Her behavior is normal. Thought content normal.   Trach change #4 cuffless trach replaced w/out difficulty.  ETCO2 + Pt tolerated well.   Assessment & Plan:   45 year old with morbid obesity, OHS/OSA, sarcoid, secondary PAH and heart failure. Trached emergently for hypercarbic respiratory failure. Difficult airway  >she has been thriving w/ #4 trach. Has had recent PSG which did indeed demonstrate moderate OSA w/ PSG completed w/ capped trach. There was minimal desaturation. She is tolerating capped trach trials well; but she has yet to have a CPAP titration trial.  >I contacted Dr Melvyn Novas. He felt titration study in anticipation for decannulation was reasonable but recommended that we set her up w/ Dr DeDios to assist w/ her OSA treatment. I have sent a message to him and am awaiting reply.  Plan Will go home w/ both regular #4 internal cannula & occlusive cannula Have instructed her to use the occlusive cannula during the day and change to #4 inner  cannula at night so that her OSA is treated.  Will await Dr DeDios preferences re: if he would like to see her first OR just go ahead and get CPAP titration study.  IF she does end-up w/ a CPAP machine I would want to see her thrive at home for a week or more before I would  consider decannulation  She was a difficult airway (from notes back in 2014) "she has extremely small mouth, large tongue, and very anterior larynx) - requiring efforts of CCS, Anesthesia, & CCM to obtain a temporary airway". She was then taken to the OR on 4/12 PM for revision / placement of tracheostomy. We will need to discuss this at length prior to decannulation as should she ever require emergent intubation her risk of complications such hypoxic injury or death would be high.   [name] ACNP-BC [place] Pager # [phone] OR # [phone] if no answer

## 2015-11-22 ENCOUNTER — Encounter: Payer: Self-pay | Admitting: Cardiology

## 2015-11-26 ENCOUNTER — Other Ambulatory Visit: Payer: Self-pay | Admitting: Pulmonary Disease

## 2015-12-02 NOTE — Progress Notes (Signed)
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 I initially saw in July of 2012 for evaluation of abnormal echocardiogram; revealed an ejection fraction of 35% which was new. There was mild biatrial enlargement, mild right ventricular enlargement and mildly reduced RV function. Cardiac MRI in July of 2012 revealed moderate to severe LV systolic dysfunction, EF [id number]. The anterior and anterolateral walls appeared severely hypokinetic. Mild RV dilation with mild systolic dysfunction. Possible small areas of mid wall delayed enhancement in the apical septal and apical lateral walls. These areas were very faint and not definitely of clinical significance. Mid wall enhancement can be found in infiltrative cardiomyopathies such as that due to sarcoidosis. TSH and ferritin normal. Cardiac catheterization repeated in September of 2012 and showed an ejection fraction of 40%. There was no coronary disease. Echo repeated in June 2016 Showed normal LV systolic function, mild left atrial enlargement, right ventricular and right atrial enlargement, moderate tricuspid regurgitation, severely elevated pulmonary pressure. Since I last saw her   Current Outpatient Prescriptions  Medication Sig Dispense Refill  . albuterol (PROVENTIL HFA;VENTOLIN HFA) 108 (90 BASE) MCG/ACT inhaler Inhale 2 puffs into the lungs every 6 (six) hours as needed for wheezing or shortness of breath. 1 Inhaler 2  . B-D INS SYRINGE 0.5CC/30GX1/2" 30G X 1/2" 0.5 ML MISC USE AS DIRECTED 100 each 1  . carvedilol (COREG) 3.125 MG tablet Take 1 tablet (3.125 mg total) by mouth 2 (two) times daily. 180 tablet 3  . fluticasone (FLONASE) 50 MCG/ACT nasal spray Place 2 sprays into both nostrils daily. 16 g 2  . glucose blood test strip Use to check blood sugar 3 times daily. Dx code E11.65. Insulin dependent 100 each 11  . insulin detemir (LEVEMIR) 100 UNIT/ML injection Inject 0.46 mLs (46 Units total) into the skin at bedtime. 40 mL  3  . Insulin Pen Needle (PEN NEEDLES) 31G X 5 MM MISC Inject 1 Units into the skin daily. Use to inject liraglutide daily. 100 each 1  . Liraglutide (VICTOZA) 18 MG/3ML SOPN Inject 0.2 mLs (1.2 mg total) into the skin daily. 6 mL 2  . lisinopril (PRINIVIL,ZESTRIL) 2.5 MG tablet take 1 tablet by mouth once daily 30 tablet 1  . meloxicam (MOBIC) 7.5 MG tablet Take 1 tablet (7.5 mg total) by mouth daily. 30 tablet 0  . metFORMIN (GLUCOPHAGE) 1000 MG tablet Take 1 tablet (1,000 mg total) by mouth 2 (two) times daily with a meal. 180 tablet 4  . torsemide (DEMADEX) 20 MG tablet Take 1 tablet (20 mg total) by mouth daily. MUST KEEP APPOINTMENT 08/30/15 WITH BRYAN HAGER,PA FOR FUTURE REFILLS OR 90-DAY SUPPLY 30 tablet 2   No current facility-administered medications for this visit.      Past Medical History:  Diagnosis Date  . Abnormal LFTs (liver function tests)    Liver U/S and exam c/w HSM. Hep B serology neg. but Hep C ab +, HIV neg. 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 Acute bronchitis 07/29/2014  . Anemia   . Cardiomyopathy, nonischemic ([place])    a. Varying EF over the years - initially 35% in 09/2010. Normal cors 12/2010. b. Echo 07/2014: EF 55-60%, no RWMA, + diastolic flattening and systolic flattening c/w RV volume and pressure overload, mild LAE/RAE, mod dilated RV, mod TR, PASP severely increased at 70mmHg. c. RHC 07/2014: moderate pulmonary HTN likely WHO group 3 with markedly elevated CVP and  relatively normal left sided pressures.  . Chronic diastolic CHF (congestive heart failure) ([place])   . Chronic respiratory failure ([place])    a. became O2 dependent in July 2011. b. She required trache placement in 07/2013 and has been followed by pulmonology as well.  . CKD (chronic kidney disease), stage II   . Complication of anesthesia    " difficult waking "  . Diabetes mellitus    insulin dependent  . Diabetic retinopathy    Right eye 2/11  .  Essential hypertension   . Health maintenance examination    Mammogram 05/2010 Negative; Last Pap smear 03/2008; Last DM eye exam 2/11> mild non-proliferative diabetic retinopathy. OD  . Helicobacter pylori ab+ 05/2011   Pt was symptomatic and treatment planned for 05/2011  . Hx of cardiac cath 2/08   No CAD, no RAS,  normal EF  . Hypokalemia   . Hypomagnesemia   . Hypoxemia    CT angiogram 9/11>> No PE; PFTs 10/11- FEV1 1.20 (49%) with 16% better p B2, DLCO 33%> corrects to 84; O2 sats ok on 4 lpm X rapid walk X 3 laps 05/2010  . Long term current use of systemic steroids   . Morbid obesity ([place])    Target wt= 153 for BMI <30  . Obesity hypoventilation syndrome ([place])   . Pulmonary hypertension ([place])   . QT prolongation   . Sarcoidosis ([place])    Followed by Dr. Melvyn Novas; w/ liver involvement per biopsy 12/09, Reversible airway component so started on [facility] 01/2010; HFA 75% p coaching 05/2010  . Seborrheic dermatitis of scalp   . Sleep apnea   . Tobacco abuse     Past Surgical History:  Procedure Laterality Date  . BREAST SURGERY    . CESAREAN SECTION    . RIGHT HEART CATHETERIZATION N/A 08/12/2014   Procedure: RIGHT HEART CATH;  Surgeon: Jolaine Artist, MD;  Location: [facility] CATH LAB;  Service: Cardiovascular;  Laterality: N/A;  . TRACHEOSTOMY TUBE PLACEMENT N/A 08/10/2013   Procedure: TRACHEOSTOMY;  Surgeon: Melissa Montane, MD;  Location: [place];  Service: ENT;  Laterality: N/A;  . TUBAL LIGATION      Social History   Social History  . Marital status: Single    Spouse name: N/A  . Number of children: 2  . Years of education: N/A   Occupational History  . 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 Smoking status: Former Smoker    Packs/day: 0.33    Years: 20.00    Types: Cigarettes    Start date: 10/05/2014  . Smokeless tobacco: Never Used     Comment: Needs to cut back.  . Alcohol use No  . Drug use: No  . Sexual activity: Not on file   Other  Topics Concern  . Not on file   Social History Narrative   Diabetic card given 05/03/2010.   Financial assistance approved for 100% discount at [employer] and has [facility] card. [name] 12/07/2009.      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    Family History  Problem Relation Age of Onset  . Cancer Mother     colon cancer  . Multiple sclerosis Father   . Diabetes Father   . Asthma Sister     in childhood  . Hypertension      ROS: no fevers or chills, productive cough, hemoptysis, dysphasia, odynophagia, melena, hematochezia, dysuria, hematuria,  rash, seizure activity, orthopnea, PND, pedal edema, claudication. Remaining systems are negative.  Physical Exam: Well-developed well-nourished in no acute distress.  Skin is warm and dry.  HEENT is normal.  Neck is supple.  Chest is clear to auscultation with normal expansion.  Cardiovascular exam is regular rate and rhythm.  Abdominal exam nontender or distended. No masses palpated. Extremities show no edema. neuro grossly intact  ECG    This encounter was created in error - please disregard.

## 2015-12-08 ENCOUNTER — Encounter: Payer: Self-pay | Admitting: Pulmonary Disease

## 2015-12-09 ENCOUNTER — Encounter: Payer: Medicare Other | Admitting: Cardiology

## 2015-12-09 ENCOUNTER — Encounter: Payer: Self-pay | Admitting: Internal Medicine

## 2015-12-09 ENCOUNTER — Ambulatory Visit (INDEPENDENT_AMBULATORY_CARE_PROVIDER_SITE_OTHER): Payer: Medicare Other | Admitting: Internal Medicine

## 2015-12-09 VITALS — BP 133/78 | HR 85 | Temp 98.4°F | Ht 61.0 in | Wt 203.4 lb

## 2015-12-09 DIAGNOSIS — Z13820 Encounter for screening for osteoporosis: Secondary | ICD-10-CM

## 2015-12-09 DIAGNOSIS — F17210 Nicotine dependence, cigarettes, uncomplicated: Secondary | ICD-10-CM | POA: Diagnosis not present

## 2015-12-09 DIAGNOSIS — M25552 Pain in left hip: Secondary | ICD-10-CM | POA: Diagnosis not present

## 2015-12-09 DIAGNOSIS — Z7189 Other specified counseling: Secondary | ICD-10-CM | POA: Insufficient documentation

## 2015-12-09 MED ORDER — METFORMIN HCL 1000 MG PO TABS: 1000.0000 mg | ORAL_TABLET | Freq: Two times a day (BID) | ORAL | 4 refills | Status: DC

## 2015-12-09 MED ORDER — DICLOFENAC SODIUM 1 % TD GEL: 4.0000 g | Freq: Four times a day (QID) | TRANSDERMAL | 4 refills | Status: DC

## 2015-12-09 NOTE — Assessment & Plan Note (Signed)
Pt will follow up to discuss DEXA screen, and bisphosphonate therapy, given that she has history of chronic steroid use putting her at risk for early onset osteoporosis.

## 2015-12-09 NOTE — Patient Instructions (Addendum)
Thank you for your visit today  Please do the xray of the hips today Please also do the MRI of the hip  When you see your PCP- please ask about doing the DEXA scan for screening for osteoporosis. As you took steroids, you are at higher risk for osteoporosis- which is brittle bones  Please take tylenol as needed for pain, and use Voltaren gel which I prescribed you

## 2015-12-09 NOTE — Assessment & Plan Note (Addendum)
Patient is here because of left hip pain for 2 weeks. THis has happened before but she was pain free after the last episode 3 months ago. She denies preceding trauma or injury. She says the pain is throbbing mainly over lateral and posterior hip, and sometimes can radiate down to the left leg.  Taking 4 naproxen a day helps the pain better. Walking makes the pain worse. She currently has to sit in a leaning position to the right to help with the pain. She has a history of sarcoid diagnosed in 2001, and per pt, she was on chronic prednisone from about 2010-2016. Currently she is off any steroids.   On exam, the sacroiliac joint is mildly ttp. Lateral hip is also mildly ttp. Str leg test neg b/l  A: Most likely trochanteric bursitis and/or sacroilitis, but given her chronic steroid use, we can not exclude avascular necrosis of the hip. This was a concern in the past but she never had an MRI. Also, we can not exclude steroid induced fragility fracture/early onset osteoporosis.   P -Xrays of the hip -MRI of the left hip without contrast -Given voltaren gel and tylenol PRN. Advised not to take >2 naproxen per day. - I explained that given her steroid use, she is at risk for early onset osteoporosis and so I recommend that she do DEXA scan, and she would also benefit from being on alendronate. She will follow up here with PCP to discuss bisphosphonate therapy and DEXA screen   Addendum Dec 24 2015 I received MRI results of her left hip. It shows left sacroilitis with small effusion concerning for infection. I discussed with Dr Lynnae January, and also called on-call orthopedics Dr. Ronnie Derby who said that the incidence of left SI joint infection is very low and they recommend we evaluate the pt on Monday. I attempted to reach pt on all numbers but unable to reach. I advised front desk to make appt for Monday

## 2015-12-09 NOTE — Progress Notes (Signed)
CC: left hip pain HPI: [name] is a 45 y.o. woman with PMH noted below here for left hip pain   Please see Problem List/A&P for the status of the patient's chronic medical problems   Past Medical History:  Diagnosis Date  . Abnormal LFTs (liver function tests)    Liver U/S and exam c/w HSM. Hep B serology neg. but Hep C ab +, HIV neg. 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 Acute bronchitis 07/29/2014  . Anemia   . Cardiomyopathy, nonischemic ([place])    a. Varying EF over the years - initially 35% in 09/2010. Normal cors 12/2010. b. Echo 07/2014: EF 55-60%, no RWMA, + diastolic flattening and systolic flattening c/w RV volume and pressure overload, mild LAE/RAE, mod dilated RV, mod TR, PASP severely increased at 31mmHg. c. RHC 07/2014: moderate pulmonary HTN likely WHO group 3 with markedly elevated CVP and relatively normal left sided pressures.  . Chronic diastolic CHF (congestive heart failure) ([place])   . Chronic respiratory failure ([place])    a. became O2 dependent in July 2011. b. She required trache placement in 07/2013 and has been followed by pulmonology as well.  . CKD (chronic kidney disease), stage II   . Complication of anesthesia    " difficult waking "  . Diabetes mellitus    insulin dependent  . Diabetic retinopathy    Right eye 2/11  . Essential hypertension   . Health maintenance examination    Mammogram 05/2010 Negative; Last Pap smear 03/2008; Last DM eye exam 2/11> mild non-proliferative diabetic retinopathy. OD  . Helicobacter pylori ab+ 05/2011   Pt was symptomatic and treatment planned for 05/2011  . Hx of cardiac cath 2/08   No CAD, no RAS,  normal EF  . Hypokalemia   . Hypomagnesemia   . Hypoxemia    CT angiogram 9/11>> No PE; PFTs 10/11- FEV1 1.20 (49%) with 16% better p B2, DLCO 33%> corrects to 84; O2 sats ok on 4 lpm X rapid walk X 3 laps 05/2010  . Long term current use of systemic steroids   .  Morbid obesity ([place])    Target wt= 153 for BMI <30  . Obesity hypoventilation syndrome ([place])   . Pulmonary hypertension ([place])   . QT prolongation   . Sarcoidosis ([place])    Followed by Dr. Melvyn Novas; w/ liver involvement per biopsy 12/09, Reversible airway component so started on [facility] 01/2010; HFA 75% p coaching 05/2010  . Seborrheic dermatitis of scalp   . Sleep apnea   . Tobacco abuse     Review of Systems: Denies fevers, chills Denies cough, SOB Denies n/v Has left hip pain, sometimes going down to the leg Denies falls  Physical Exam: Vitals:   12/09/15 1332  BP: 133/78  Pulse: 85  Temp: 98.4 F (36.9 C)  TempSrc: Oral  SpO2: 100%  Weight: 203 lb 6.4 oz (92.3 kg)  Height: 5\' 1"  (1.549 m)    General: A&O, in NAD CV: RRR, normal s1, s2, no m/r/g Resp: equal and symmetric breath sounds, no wheezing or crackles  Abdomen: soft, nontender, nondistended, +BS Hip: Lateral hip and posterior hip mildly tender to palpation        Anterior hip nontender to palpation         Bilateral st leg test negative  Back exam: non ttp          Assessment & Plan:  See encounters tab for problem based medical decision making. Patient discussed with Dr. Daryll Drown

## 2015-12-15 ENCOUNTER — Ambulatory Visit (HOSPITAL_COMMUNITY): Admission: RE | Admit: 2015-12-15 | Payer: Medicare Other | Source: Ambulatory Visit

## 2015-12-20 NOTE — Progress Notes (Signed)
Internal Medicine Clinic Attending  Case discussed with Dr. Saraiya at the time of the visit.  We reviewed the resident's history and exam and pertinent patient test results.  I agree with the assessment, diagnosis, and plan of care documented in the resident's note.  

## 2015-12-24 ENCOUNTER — Ambulatory Visit (HOSPITAL_COMMUNITY)
Admission: RE | Admit: 2015-12-24 | Discharge: 2015-12-24 | Disposition: A | Discharge status: Home / Self Care | Payer: Medicare Other | Source: Ambulatory Visit | Attending: Oncology | Admitting: Oncology

## 2015-12-24 ENCOUNTER — Ambulatory Visit (HOSPITAL_COMMUNITY): Admission: RE | Admit: 2015-12-24 | Payer: Medicare Other | Source: Ambulatory Visit

## 2015-12-24 DIAGNOSIS — M25452 Effusion, left hip: Secondary | ICD-10-CM | POA: Insufficient documentation

## 2015-12-24 DIAGNOSIS — M25552 Pain in left hip: Secondary | ICD-10-CM

## 2015-12-24 DIAGNOSIS — M461 Sacroiliitis, not elsewhere classified: Secondary | ICD-10-CM | POA: Insufficient documentation

## 2015-12-27 ENCOUNTER — Ambulatory Visit (HOSPITAL_COMMUNITY)
Admission: EM | Admit: 2015-12-27 | Discharge: 2015-12-27 | Disposition: A | Discharge status: Home / Self Care | Payer: Medicare Other | Attending: Family Medicine | Admitting: Family Medicine

## 2015-12-27 ENCOUNTER — Encounter (HOSPITAL_COMMUNITY): Payer: Self-pay | Admitting: *Deleted

## 2015-12-27 DIAGNOSIS — N390 Urinary tract infection, site not specified: Secondary | ICD-10-CM

## 2015-12-27 LAB — POCT URINALYSIS DIP (DEVICE)
Bilirubin Urine: NEGATIVE
Glucose, UA: NEGATIVE mg/dL
Ketones, ur: NEGATIVE mg/dL
Nitrite: NEGATIVE
Protein, ur: 30 mg/dL — AB
Specific Gravity, Urine: 1.015 (ref 1.005–1.030)
Urobilinogen, UA: 0.2 mg/dL (ref 0.0–1.0)
pH: 5.5 (ref 5.0–8.0)

## 2015-12-27 LAB — POCT PREGNANCY, URINE: Preg Test, Ur: NEGATIVE

## 2015-12-27 MED ORDER — PHENAZOPYRIDINE HCL 200 MG PO TABS: 200.0000 mg | ORAL_TABLET | Freq: Three times a day (TID) | ORAL | 0 refills | Status: DC

## 2015-12-27 MED ORDER — CIPROFLOXACIN HCL 500 MG PO TABS: 500.0000 mg | ORAL_TABLET | Freq: Two times a day (BID) | ORAL | 0 refills | Status: DC

## 2015-12-27 NOTE — ED Triage Notes (Signed)
Pt  Has  Symptoms  Of      Frequency     Urinary  Tract  Infection        Pt  Awake  Alert   Appears  In no  Acute  Distress   Speaking  In  Complete  sentances

## 2015-12-27 NOTE — ED Provider Notes (Signed)
CSN: [id number]     Arrival date & time 12/27/15  1002 History   None    Chief Complaint  Patient presents with  . Urinary Tract Infection   (Consider location/radiation/quality/duration/timing/severity/associated sxs/prior Treatment) C/o dysuria and urinary frequency    Urinary Frequency  This is a new problem. The current episode started 2 days ago. The problem occurs constantly. The problem has not changed since onset.Nothing aggravates the symptoms. Nothing relieves the symptoms. She has tried nothing for the symptoms.    Past Medical History:  Diagnosis Date  . Abnormal LFTs (liver function tests)    Liver U/S and exam c/w HSM. Hep B serology neg. but Hep C ab +, HIV neg. 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 Acute bronchitis 07/29/2014  . Anemia   . Cardiomyopathy, nonischemic ([place])    a. Varying EF over the years - initially 35% in 09/2010. Normal cors 12/2010. b. Echo 07/2014: EF 55-60%, no RWMA, + diastolic flattening and systolic flattening c/w RV volume and pressure overload, mild LAE/RAE, mod dilated RV, mod TR, PASP severely increased at 53mmHg. c. RHC 07/2014: moderate pulmonary HTN likely WHO group 3 with markedly elevated CVP and relatively normal left sided pressures.  . Chronic diastolic CHF (congestive heart failure) ([place])   . Chronic respiratory failure ([place])    a. became O2 dependent in July 2011. b. She required trache placement in 07/2013 and has been followed by pulmonology as well.  . CKD (chronic kidney disease), stage II   . Complication of anesthesia    " difficult waking "  . Diabetes mellitus    insulin dependent  . Diabetic retinopathy    Right eye 2/11  . Essential hypertension   . Health maintenance examination    Mammogram 05/2010 Negative; Last Pap smear 03/2008; Last DM eye exam 2/11> mild non-proliferative diabetic retinopathy. OD  . Helicobacter pylori ab+ 05/2011   Pt was symptomatic and  treatment planned for 05/2011  . Hx of cardiac cath 2/08   No CAD, no RAS,  normal EF  . Hypokalemia   . Hypomagnesemia   . Hypoxemia    CT angiogram 9/11>> No PE; PFTs 10/11- FEV1 1.20 (49%) with 16% better p B2, DLCO 33%> corrects to 84; O2 sats ok on 4 lpm X rapid walk X 3 laps 05/2010  . Long term current use of systemic steroids   . Morbid obesity ([place])    Target wt= 153 for BMI <30  . Obesity hypoventilation syndrome ([place])   . Pulmonary hypertension ([place])   . QT prolongation   . Sarcoidosis ([place])    Followed by Dr. Melvyn Novas; w/ liver involvement per biopsy 12/09, Reversible airway component so started on [facility] 01/2010; HFA 75% p coaching 05/2010  . Seborrheic dermatitis of scalp   . Sleep apnea   . Tobacco abuse    Past Surgical History:  Procedure Laterality Date  . BREAST SURGERY    . CESAREAN SECTION    . RIGHT HEART CATHETERIZATION N/A 08/12/2014   Procedure: RIGHT HEART CATH;  Surgeon: Jolaine Artist, MD;  Location: [facility] CATH LAB;  Service: Cardiovascular;  Laterality: N/A;  . TRACHEOSTOMY TUBE PLACEMENT N/A 08/10/2013   Procedure: TRACHEOSTOMY;  Surgeon: Melissa Montane, MD;  Location: [facility] OR;  Service: ENT;  Laterality: N/A;  . TUBAL LIGATION     Family History  Problem Relation Age of Onset  . Cancer Mother  colon cancer  . Multiple sclerosis Father   . Diabetes Father   . Asthma Sister     in childhood  . Hypertension     Social History  Substance Use Topics  . Smoking status: Current Some Day Smoker    Packs/day: 0.33    Years: 20.00    Types: Cigarettes    Start date: 10/05/2014  . Smokeless tobacco: Never Used     Comment: Needs to cut back.  . Alcohol use No   OB History    No data available     Review of Systems  Constitutional: Negative.   HENT: Negative.   Eyes: Negative.   Respiratory: Negative.   Cardiovascular: Negative.   Gastrointestinal: Negative.   Endocrine: Negative.   Genitourinary: Positive for frequency.  Musculoskeletal: Negative.    Skin: Negative.   Allergic/Immunologic: Negative.   Neurological: Negative.   Hematological: Negative.   Psychiatric/Behavioral: Negative.     Allergies  Vicodin [hydrocodone-acetaminophen]  Home Medications   Prior to Admission medications   Medication Sig Start Date End Date Taking? Authorizing Provider  albuterol (PROVENTIL HFA;VENTOLIN HFA) 108 (90 BASE) MCG/ACT inhaler Inhale 2 puffs into the lungs every 6 (six) hours as needed for wheezing or shortness of breath. 11/26/13   Marijean Heath, NP  B-D INS SYRINGE 0.5CC/30GX1/2" 30G X 1/2" 0.5 ML MISC USE AS DIRECTED 05/19/14   Wilber Oliphant, MD  carvedilol (COREG) 3.125 MG tablet Take 1 tablet (3.125 mg total) by mouth 2 (two) times daily. 08/30/15   Brett Canales, PA-C  diclofenac sodium (VOLTAREN) 1 % GEL Apply 4 g topically 4 (four) times daily. 12/09/15   Burgess Estelle, MD  fluticasone (FLONASE) 50 MCG/ACT nasal spray Place 2 sprays into both nostrils daily. 07/31/14   Jones Bales, MD  glucose blood test strip Use to check blood sugar 3 times daily. Dx code E11.65. Insulin dependent 10/01/15   Milagros Loll, MD  insulin detemir (LEVEMIR) 100 UNIT/ML injection Inject 0.46 mLs (46 Units total) into the skin at bedtime. 01/19/15   Milagros Loll, MD  Insulin Pen Needle (PEN NEEDLES) 31G X 5 MM MISC Inject 1 Units into the skin daily. Use to inject liraglutide daily. 08/19/15   Milagros Loll, MD  Liraglutide (VICTOZA) 18 MG/3ML SOPN Inject 0.2 mLs (1.2 mg total) into the skin daily. 11/28/15   Milagros Loll, MD  lisinopril (PRINIVIL,ZESTRIL) 2.5 MG tablet take 1 tablet by mouth once daily 11/28/15   Milagros Loll, MD  meloxicam (MOBIC) 7.5 MG tablet Take 1 tablet (7.5 mg total) by mouth daily. Patient not taking: Reported on 12/09/2015 09/02/15 09/01/16  Milagros Loll, MD  metFORMIN (GLUCOPHAGE) 1000 MG tablet Take 1 tablet (1,000 mg total) by mouth 2 (two) times daily with a meal. 12/09/15   Burgess Estelle, MD  torsemide  (DEMADEX) 20 MG tablet Take 1 tablet (20 mg total) by mouth daily. MUST KEEP APPOINTMENT 08/30/15 WITH BRYAN HAGER,PA FOR FUTURE REFILLS OR 90-DAY SUPPLY 10/11/15   Lelon Perla, MD   Meds Ordered and Administered this Visit  Medications - No data to display  BP 117/84 (BP Location: Left Arm)   Pulse 102   Temp 97.9 F (36.6 C) (Oral)   Resp 16   LMP 11/18/2015   SpO2 100%  No data found.   Physical Exam  Constitutional: She appears well-developed and well-nourished.  HENT:  Head: Normocephalic and atraumatic.  Right Ear: External ear normal.  Left  Ear: External ear normal.  Mouth/Throat: Oropharynx is clear and moist.  Eyes: EOM are normal. Pupils are equal, round, and reactive to light.  Neck: Normal range of motion. Neck supple.  Cardiovascular: Normal rate, regular rhythm and normal heart sounds.   Pulmonary/Chest: Eff[place] normal and breath sounds normal.  Abdominal: Soft. Bowel sounds are normal.  Nursing note and vitals reviewed.   Urgent Care Course   Clinical Course    Procedures (including critical care time)  Labs Review Labs Reviewed  POCT URINALYSIS DIP (DEVICE) - Abnormal; Notable for the following:       Result Value   Hgb urine dipstick SMALL (*)    Protein, ur 30 (*)    Leukocytes, UA TRACE (*)    All other components within normal limits  POCT PREGNANCY, URINE    Imaging Review No results found.   Visual Acuity Review  Right Eye Distance:   Left Eye Distance:   Bilateral Distance:    Right Eye Near:   Left Eye Near:    Bilateral Near:         MDM  UTI Dysuria  Cipro 500mg  one po bid x 7 days #14 Pyridium 200mg  one po tid x 2 d #6     Lysbeth Penner, FNP 12/27/15 1108    Lysbeth Penner, FNP 12/27/15 1110

## 2016-01-04 NOTE — Progress Notes (Signed)
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 Echocardiogram in June of 2012 revealed an ejection fraction of 35% which was new. There was mild biatrial enlargement, mild right ventricular enlargement and mildly reduced RV function. Cardiac MRI in July of 2012 revealed moderate to severe LV systolic dysfunction, EF [id number]. The anterior and anterolateral walls appeared severely hypokinetic. Mild RV dilation with mild systolic dysfunction. Possible small areas of mid wall delayed enhancement in the apical septal and apical lateral walls. These areas were very faint and not definitely of clinical significance. Mid wall enhancement can be found in infiltrative cardiomyopathies such as that due to sarcoidosis. TSH and ferritin normal. Cardiac catheterization repeated in September of 2012 and showed an ejection fraction of 40%. There was no coronary disease. Echo repeated in June 2015 and showed normal LV function, grade 2 diastolic dysfunction, mild LAE, mild RAE; mild RVE with reduced function. Lower ext dopplers 8/15 showed no DVT. Echo 4/16 showed normal LV function, mild LAE, mild RAE; moderate RVE, moderate TR with severely elevated pulmonary pressure. Since I last saw her One week ago she had an episode of palpitations lasting 30 minutes. There was no associated symptoms.Resolved spontaneously. She otherwise denies dyspnea, chest pain or syncope.  Current Outpatient Prescriptions  Medication Sig Dispense Refill  . albuterol (PROVENTIL HFA;VENTOLIN HFA) 108 (90 BASE) MCG/ACT inhaler Inhale 2 puffs into the lungs every 6 (six) hours as needed for wheezing or shortness of breath. 1 Inhaler 2  . B-D INS SYRINGE 0.5CC/30GX1/2" 30G X 1/2" 0.5 ML MISC USE AS DIRECTED 100 each 1  . carvedilol (COREG) 3.125 MG tablet Take 1 tablet (3.125 mg total) by mouth 2 (two) times daily. 180 tablet 3  . diclofenac sodium (VOLTAREN) 1 % GEL Apply 4 g topically 4 (four) times daily. 100 g 4  .  fluticasone (FLONASE) 50 MCG/ACT nasal spray Place 2 sprays into both nostrils daily. 16 g 2  . glucose blood test strip Use to check blood sugar 3 times daily. Dx code E11.65. Insulin dependent 100 each 11  . insulin detemir (LEVEMIR) 100 UNIT/ML injection Inject 0.46 mLs (46 Units total) into the skin at bedtime. 40 mL 3  . Insulin Pen Needle (PEN NEEDLES) 31G X 5 MM MISC Inject 1 Units into the skin daily. Use to inject liraglutide daily. 100 each 1  . Liraglutide (VICTOZA) 18 MG/3ML SOPN Inject 0.2 mLs (1.2 mg total) into the skin daily. 6 mL 2  . lisinopril (PRINIVIL,ZESTRIL) 2.5 MG tablet take 1 tablet by mouth once daily 30 tablet 1  . meloxicam (MOBIC) 7.5 MG tablet Take 1 tablet (7.5 mg total) by mouth daily. 30 tablet 0  . metFORMIN (GLUCOPHAGE) 1000 MG tablet Take 1 tablet (1,000 mg total) by mouth 2 (two) times daily with a meal. 180 tablet 4  . torsemide (DEMADEX) 20 MG tablet Take 1 tablet (20 mg total) by mouth daily. MUST KEEP APPOINTMENT 08/30/15 WITH BRYAN HAGER,PA FOR FUTURE REFILLS OR 90-DAY SUPPLY 30 tablet 2   No current facility-administered medications for this visit.      Past Medical History:  Diagnosis Date  . Abnormal LFTs (liver function tests)    Liver U/S and exam c/w HSM. Hep B serology neg. but Hep C ab +, HIV neg. 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 Acute bronchitis 07/29/2014  . Anemia   . Cardiomyopathy, nonischemic ([place])    a.  Varying EF over the years - initially 35% in 09/2010. Normal cors 12/2010. b. Echo 07/2014: EF 55-60%, no RWMA, + diastolic flattening and systolic flattening c/w RV volume and pressure overload, mild LAE/RAE, mod dilated RV, mod TR, PASP severely increased at 1mmHg. c. RHC 07/2014: moderate pulmonary HTN likely WHO group 3 with markedly elevated CVP and relatively normal left sided pressures.  . Chronic diastolic CHF (congestive heart failure) ([place])   . Chronic respiratory failure  ([place])    a. became O2 dependent in July 2011. b. She required trache placement in 07/2013 and has been followed by pulmonology as well.  . CKD (chronic kidney disease), stage II   . Complication of anesthesia    " difficult waking "  . Diabetes mellitus    insulin dependent  . Diabetic retinopathy    Right eye 2/11  . Essential hypertension   . Health maintenance examination    Mammogram 05/2010 Negative; Last Pap smear 03/2008; Last DM eye exam 2/11> mild non-proliferative diabetic retinopathy. OD  . Helicobacter pylori ab+ 05/2011   Pt was symptomatic and treatment planned for 05/2011  . Hx of cardiac cath 2/08   No CAD, no RAS,  normal EF  . Hypokalemia   . Hypomagnesemia   . Hypoxemia    CT angiogram 9/11>> No PE; PFTs 10/11- FEV1 1.20 (49%) with 16% better p B2, DLCO 33%> corrects to 84; O2 sats ok on 4 lpm X rapid walk X 3 laps 05/2010  . Long term current use of systemic steroids   . Morbid obesity ([place])    Target wt= 153 for BMI <30  . Obesity hypoventilation syndrome ([place])   . Pulmonary hypertension ([place])   . QT prolongation   . Sarcoidosis ([place])    Followed by Dr. Melvyn Novas; w/ liver involvement per biopsy 12/09, Reversible airway component so started on [facility] 01/2010; HFA 75% p coaching 05/2010  . Seborrheic dermatitis of scalp   . Sleep apnea   . Tobacco abuse     Past Surgical History:  Procedure Laterality Date  . BREAST SURGERY    . CESAREAN SECTION    . RIGHT HEART CATHETERIZATION N/A 08/12/2014   Procedure: RIGHT HEART CATH;  Surgeon: Jolaine Artist, MD;  Location: [facility] CATH LAB;  Service: Cardiovascular;  Laterality: N/A;  . TRACHEOSTOMY TUBE PLACEMENT N/A 08/10/2013   Procedure: TRACHEOSTOMY;  Surgeon: Melissa Montane, MD;  Location: [facility];  Service: ENT;  Laterality: N/A;  . TUBAL LIGATION      Social History   Social History  . Marital status: Single    Spouse name: N/A  . Number of children: 2  . Years of education: N/A   Occupational History  . 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 Smoking status: Current Some Day Smoker    Packs/day: 0.33    Years: 20.00    Types: Cigarettes    Start date: 10/05/2014  . Smokeless tobacco: Never Used     Comment: Needs to cut back.  . Alcohol use No  . Drug use: No  . Sexual activity: Not on file   Other Topics Concern  . Not on file   Social History Narrative   Diabetic card given 05/03/2010.   Financial assistance approved for 100% discount at [facility] and has [facility] card. [name] 12/07/2009.      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  Family History  Problem Relation Age of Onset  . Cancer Mother     colon cancer  . Multiple sclerosis Father   . Diabetes Father   . Asthma Sister     in childhood  . Hypertension      ROS: no fevers or chills, productive cough, hemoptysis, dysphasia, odynophagia, melena, hematochezia, dysuria, hematuria, rash, seizure activity, orthopnea, PND, pedal edema, claudication. Remaining systems are negative.  Physical Exam: Well-developed obese in no acute distress.  Skin is warm and dry.  HEENT is normal.  Neck is supple.  Tracheostomy in place Chest is clear to auscultation with normal expansion.  Cardiovascular exam is regular rate and rhythm.  Abdominal exam nontender or distended. No masses palpated. Extremities show no edema. neuro grossly intact  ECG-Sinus  A/P Rhythm at a rate of 100. Cannot rule out prior anterior infarct. Left ventricular hypertrophy.  1 Cardiomyopathy-LV function improved on most recent echocardiogram. Continue ARB and beta blocker. Plan repeat echocardiogram.  2 hypertension-blood pressure controlled. Continue present medications.  3 hyperlipidemia-Management per primary care.  4 morbid obesity-She has lost 30 pounds and I encouraged her to continue.  5 chronic diastolic congestive heart failure-continue present dose of Lasix. Check potassium and renal  function.  6 sarcoidosis-followed by pulmonary.  7 palpitations-if she has more frequent episodes in the future we will plan an event monitor. Check echocardiogram.  8 tobacco abuse-patient counseled on discontinuing.  Kirk Ruths, MD

## 2016-01-06 ENCOUNTER — Ambulatory Visit (INDEPENDENT_AMBULATORY_CARE_PROVIDER_SITE_OTHER): Payer: Medicare Other | Admitting: Cardiology

## 2016-01-06 ENCOUNTER — Encounter: Payer: Self-pay | Admitting: Cardiology

## 2016-01-06 VITALS — BP 116/80 | HR 100 | Ht 61.0 in | Wt 200.0 lb

## 2016-01-06 DIAGNOSIS — I10 Essential (primary) hypertension: Secondary | ICD-10-CM | POA: Diagnosis not present

## 2016-01-06 DIAGNOSIS — Z79899 Other long term (current) drug therapy: Secondary | ICD-10-CM | POA: Diagnosis not present

## 2016-01-06 DIAGNOSIS — I5032 Chronic diastolic (congestive) heart failure: Secondary | ICD-10-CM | POA: Diagnosis not present

## 2016-01-06 DIAGNOSIS — E785 Hyperlipidemia, unspecified: Secondary | ICD-10-CM

## 2016-01-06 DIAGNOSIS — Z720 Tobacco use: Secondary | ICD-10-CM | POA: Diagnosis not present

## 2016-01-06 NOTE — Patient Instructions (Signed)
Medication Instructions:  NO CHANGES.  Labwork: Your physician recommends that you return for lab work in: [place]. The lab can be found on the FIRST FLOOR of out building in [address]   Testing/Procedures: Your physician has requested that you have an echocardiogram. Echocardiography is a painless test that uses sound waves to create images of your heart. It provides your doctor with information about the size and shape of your heart and how well your heart's chambers and valves are working. This procedure takes approximately one hour. There are no restrictions for this procedure.    Follow-Up: Your physician wants you to follow-up in: [place]. You will receive a reminder letter in the mail two months in advance. If you don't receive a letter, please call our office to schedule the follow-up appointment.   Any Other Special Instructions Will Be Listed Below (If Applicable). Echocardiogram An echocardiogram, or echocardiography, uses sound waves (ultrasound) to produce an image of your heart. The echocardiogram is simple, painless, obtained within a short period of time, and offers valuable information to your health care provider. The images from an echocardiogram can provide information such as:  Evidence of coronary artery disease (CAD).  Heart size.  Heart muscle function.  Heart valve function.  Aneurysm detection.  Evidence of a past heart attack.  Fluid buildup around the heart.  Heart muscle thickening.  Assess heart valve function. LET [facility] CARE PROVIDER KNOW ABOUT:  Any allergies you have.  All medicines you are taking, including vitamins, herbs, eye drops, creams, and over-the-counter medicines.  Previous problems you or members of your family have had with the use of anesthetics.  Any blood disorders you have.  Previous surgeries you have had.  Medical conditions you have.  Possibility of pregnancy, if this applies. BEFORE THE  PROCEDURE  No special preparation is needed. Eat and drink normally.  PROCEDURE   In order to produce an image of your heart, gel will be applied to your chest and a wand-like tool (transducer) will be moved over your chest. The gel will help transmit the sound waves from the transducer. The sound waves will harmlessly bounce off your heart to allow the heart images to be captured in real-time motion. These images will then be recorded.  You may need an IV to receive a medicine that improves the quality of the pictures. AFTER THE PROCEDURE You may return to your normal schedule including diet, activities, and medicines, unless your health care provider tells you otherwise.   This information is not intended to replace advice given to you by your health care provider. Make sure you discuss any questions you have with your health care provider.   Document Released: 04/14/2000 Document Revised: 05/08/2014 Document Reviewed: 12/23/2012 Elsevier Interactive Patient Education [employer].     If you need a refill on your cardiac medications before your next appointment, please call your pharmacy.

## 2016-01-07 LAB — BASIC METABOLIC PANEL
BUN: 46 mg/dL — ABNORMAL HIGH (ref 7–25)
CO2: 20 mmol/L (ref 20–31)
Calcium: 9.6 mg/dL (ref 8.6–10.2)
Chloride: 112 mmol/L — ABNORMAL HIGH (ref 98–110)
Creat: 1.9 mg/dL — ABNORMAL HIGH (ref 0.50–1.10)
Glucose, Bld: 151 mg/dL — ABNORMAL HIGH (ref 65–99)
Potassium: 5.6 mmol/L — ABNORMAL HIGH (ref 3.5–5.3)
Sodium: 140 mmol/L (ref 135–146)

## 2016-01-10 ENCOUNTER — Other Ambulatory Visit: Payer: Self-pay | Admitting: *Deleted

## 2016-01-10 DIAGNOSIS — Z79899 Other long term (current) drug therapy: Secondary | ICD-10-CM

## 2016-01-11 ENCOUNTER — Other Ambulatory Visit: Payer: Self-pay | Admitting: *Deleted

## 2016-01-11 NOTE — Addendum Note (Signed)
Addended by: Venetia Maxon on: 01/11/2016 09:44 AM   Modules accepted: Orders

## 2016-01-14 ENCOUNTER — Ambulatory Visit (INDEPENDENT_AMBULATORY_CARE_PROVIDER_SITE_OTHER): Payer: Medicare Other | Admitting: Internal Medicine

## 2016-01-14 VITALS — BP 152/83 | HR 90 | Temp 97.9°F | Ht 61.0 in | Wt 202.6 lb

## 2016-01-14 DIAGNOSIS — Z79899 Other long term (current) drug therapy: Secondary | ICD-10-CM | POA: Diagnosis not present

## 2016-01-14 DIAGNOSIS — Z23 Encounter for immunization: Secondary | ICD-10-CM

## 2016-01-14 DIAGNOSIS — I10 Essential (primary) hypertension: Secondary | ICD-10-CM | POA: Diagnosis not present

## 2016-01-14 DIAGNOSIS — M533 Sacrococcygeal disorders, not elsewhere classified: Secondary | ICD-10-CM | POA: Diagnosis present

## 2016-01-14 DIAGNOSIS — Z794 Long term (current) use of insulin: Secondary | ICD-10-CM

## 2016-01-14 DIAGNOSIS — Z0000 Encounter for general adult medical examination without abnormal findings: Secondary | ICD-10-CM

## 2016-01-14 DIAGNOSIS — F17210 Nicotine dependence, cigarettes, uncomplicated: Secondary | ICD-10-CM | POA: Diagnosis not present

## 2016-01-14 DIAGNOSIS — E119 Type 2 diabetes mellitus without complications: Secondary | ICD-10-CM | POA: Diagnosis not present

## 2016-01-14 DIAGNOSIS — E0800 Diabetes mellitus due to underlying condition with hyperosmolarity without nonketotic hyperglycemic-hyperosmolar coma (NKHHC): Principal | ICD-10-CM

## 2016-01-14 LAB — GLUCOSE, CAPILLARY: Glucose-Capillary: 137 mg/dL — ABNORMAL HIGH (ref 65–99)

## 2016-01-14 LAB — POCT GLYCOSYLATED HEMOGLOBIN (HGB A1C): Hemoglobin A1C: 7.8

## 2016-01-14 MED ORDER — LIRAGLUTIDE 18 MG/3ML ~~LOC~~ SOPN: 1.2000 mg | PEN_INJECTOR | Freq: Every day | SUBCUTANEOUS | 2 refills | Status: DC

## 2016-01-14 MED ORDER — INSULIN DETEMIR 100 UNIT/ML ~~LOC~~ SOLN: 46.0000 [IU] | Freq: Every day | SUBCUTANEOUS | 3 refills | Status: DC

## 2016-01-14 MED ORDER — TORSEMIDE 20 MG PO TABS: 20.0000 mg | ORAL_TABLET | Freq: Every day | ORAL | 2 refills | Status: DC

## 2016-01-14 NOTE — Assessment & Plan Note (Signed)
She was provided flu shot today.

## 2016-01-14 NOTE — Assessment & Plan Note (Signed)
She states that her left hip pain has been improved. She came today to discuss the MRI results of her left hip. Her MRI shows: IMPRESSION: 1. Unilateral left sacroiliitis with a small left SI joint effusion and adjacent soft tissue edema. The appearance is concerning for infectious sacroiliitis until proven otherwise. 2. No avascular necrosis of bilateral hips.  Dr. Obie Dredge has talked with Dr. [name] at orthopedic, and he told that chances getting  an infected sacroiliac joint is  Low. As her symptoms have improved and she has no other sign of infection, it looks like some inflammation of sacroiliac joint. We advised her to follow-up with [name] if her symptoms get worse again or she develop any fever. We'll might need to refer her to orthopedic at that point.

## 2016-01-14 NOTE — Assessment & Plan Note (Signed)
CBG  Today was 135  HgbA1c was 7.8  She told me that she ran out of her Levemir for about 2 months and just using Victoza and metformin.  I provided her with a refill on Levemir and Victoza today. She should be using Levemir regularly as advised, her A1c is still above goal. I told her to bring her blood sugar log during her next follow-up visit.

## 2016-01-14 NOTE — Progress Notes (Signed)
   CC: To get her left hip MRI results.  HPI:  [name] is a 45 y.o.with PMHx listed as below came to the clinic to get her left hip MRI results. She has no complaints today. She states that her left hip pain has improved. She also need refill for her torsemide, Levemir and the Victoza. She also wants to get her flu shot today.    Past Medical History:  Diagnosis Date  . Abnormal LFTs (liver function tests)    Liver U/S and exam c/w HSM. Hep B serology neg. but Hep C ab +, HIV neg. 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 Acute bronchitis 07/29/2014  . Anemia   . Cardiomyopathy, nonischemic ([place])    a. Varying EF over the years - initially 35% in 09/2010. Normal cors 12/2010. b. Echo 07/2014: EF 55-60%, no RWMA, + diastolic flattening and systolic flattening c/w RV volume and pressure overload, mild LAE/RAE, mod dilated RV, mod TR, PASP severely increased at 31mmHg. c. RHC 07/2014: moderate pulmonary HTN likely WHO group 3 with markedly elevated CVP and relatively normal left sided pressures.  . Chronic diastolic CHF (congestive heart failure) ([place])   . Chronic respiratory failure ([place])    a. became O2 dependent in July 2011. b. She required trache placement in 07/2013 and has been followed by pulmonology as well.  . CKD (chronic kidney disease), stage II   . Complication of anesthesia    " difficult waking "  . Diabetes mellitus    insulin dependent  . Diabetic retinopathy    Right eye 2/11  . Essential hypertension   . Health maintenance examination    Mammogram 05/2010 Negative; Last Pap smear 03/2008; Last DM eye exam 2/11> mild non-proliferative diabetic retinopathy. OD  . Helicobacter pylori ab+ 05/2011   Pt was symptomatic and treatment planned for 05/2011  . Hx of cardiac cath 2/08   No CAD, no RAS,  normal EF  . Hypokalemia   . Hypomagnesemia   . Hypoxemia    CT angiogram 9/11>> No PE; PFTs 10/11- FEV1 1.20 (49%) with  16% better p B2, DLCO 33%> corrects to 84; O2 sats ok on 4 lpm X rapid walk X 3 laps 05/2010  . Long term current use of systemic steroids   . Morbid obesity ([place])    Target wt= 153 for BMI <30  . Obesity hypoventilation syndrome ([place])   . Pulmonary hypertension ([place])   . QT prolongation   . Sarcoidosis ([place])    Followed by Dr. Melvyn Novas; w/ liver involvement per biopsy 12/09, Reversible airway component so started on [facility] 01/2010; HFA 75% p coaching 05/2010  . Seborrheic dermatitis of scalp   . Sleep apnea   . Tobacco abuse     Review of Systems:    Physical Exam:  Vitals:   01/14/16 1141  BP: (!) 152/83  Pulse: 90  Temp: 97.9 F (36.6 C)  TempSrc: Oral  SpO2: 100%  Weight: 202 lb 9.6 oz (91.9 kg)  Height: 5\' 1"  (1.549 m)   Gen. well-built, well-nourished lady in no acute distress. Lungs. Clear bilaterally CV. RRR, No R/M/G. Abdomen. Soft, nontender, bowel sounds positive. Left hip. Nontender, Full range of motion. Extremities. No edema, no cyanosis pulses 2+ bilaterally.  Assessment & Plan:   See Encounters Tab for problem based charting.  Patient seen with Dr. Dareen Piano

## 2016-01-14 NOTE — Progress Notes (Signed)
Internal Medicine Clinic Attending  I saw and evaluated the patient.  I personally confirmed the key portions of the history and exam documented by Dr. Amin and I reviewed pertinent patient test results.  The assessment, diagnosis, and plan were formulated together and I agree with the documentation in the resident's note. 

## 2016-01-14 NOTE — Assessment & Plan Note (Signed)
BP Readings from Last 3 Encounters:  01/14/16 (!) 152/83  01/06/16 116/80  12/27/15 117/84   Her blood pressure was high today. She never took her morning dose of torsemide and Coreg. She was normotensive during her previous visits.  I advised her to take her morning pills before she will come for her follow-up visit. I told her to bring her blood pressure log during her next follow-up appointment. We will continue with the current management. Reassess during next follow-up visit.

## 2016-01-14 NOTE — Patient Instructions (Addendum)
It was pleasure taking care of you today. I am glad your hip pain has improved,If you have any concerns in the future, please let us know. Please use your insulin as instructed. Please come back in about a month to re check your blood sugars. Your BP was little high today,as you never took your medicine this morning I am not making any changes. Please take your medicine on the morning before coming to your appointment and bring your BP and Blood sugar log with you.

## 2016-01-18 ENCOUNTER — Encounter: Payer: Self-pay | Admitting: Pulmonary Disease

## 2016-01-18 ENCOUNTER — Ambulatory Visit: Payer: Medicare Other

## 2016-01-19 ENCOUNTER — Telehealth: Payer: Self-pay | Admitting: Pulmonary Disease

## 2016-01-19 NOTE — Telephone Encounter (Signed)
APT. REMINDER CALL, LMTCB °

## 2016-01-20 ENCOUNTER — Ambulatory Visit (INDEPENDENT_AMBULATORY_CARE_PROVIDER_SITE_OTHER): Payer: Medicare Other | Admitting: Internal Medicine

## 2016-01-20 ENCOUNTER — Other Ambulatory Visit (HOSPITAL_COMMUNITY): Payer: Medicare Other

## 2016-01-20 VITALS — BP 148/90 | HR 113 | Temp 98.4°F | Ht 61.0 in | Wt 200.5 lb

## 2016-01-20 DIAGNOSIS — R600 Localized edema: Secondary | ICD-10-CM | POA: Diagnosis not present

## 2016-01-20 DIAGNOSIS — M79672 Pain in left foot: Secondary | ICD-10-CM

## 2016-01-20 DIAGNOSIS — I10 Essential (primary) hypertension: Secondary | ICD-10-CM | POA: Diagnosis not present

## 2016-01-20 DIAGNOSIS — F17210 Nicotine dependence, cigarettes, uncomplicated: Secondary | ICD-10-CM

## 2016-01-20 DIAGNOSIS — Z79899 Other long term (current) drug therapy: Secondary | ICD-10-CM

## 2016-01-20 MED ORDER — DICLOFENAC SODIUM 1 % TD GEL: 4.0000 g | Freq: Four times a day (QID) | TRANSDERMAL | 4 refills | Status: DC

## 2016-01-20 MED ORDER — MELOXICAM 15 MG PO TABS: 15.0000 mg | ORAL_TABLET | Freq: Every day | ORAL | 0 refills | Status: DC

## 2016-01-20 NOTE — Assessment & Plan Note (Signed)
BP Readings from Last 3 Encounters:  01/20/16 (!) 148/90  01/14/16 (!) 152/83  01/06/16 116/80   Her blood pressure was high today. It might be due to pain.  Reassess during her next follow-up visit. Continue with the current management.

## 2016-01-20 NOTE — Patient Instructions (Addendum)
Thank you for coming to the clinic today. You can use some ice wrap on your foot. You can use Ace wrap to support your foot. Voltaren gel can help. You can soak your foot in warm salty water. I'm giving you a prescription for meloxicam and Voltaren gel. Please come back if his symptoms get worse or you develop fever.

## 2016-01-20 NOTE — Assessment & Plan Note (Addendum)
came to the clinic with complaint of swelling and pain in her left foot. She states that she noticed some pain when she woke up Sunday morning, then her left foot got swollen. She is having difficulty on walking. The pain is non radiating, get worse with hanging feet or walking. She tried Aleve, Tylenol and some arthritis gel with minimum relief. She denies any history of trauma, ankle twisting. She states that she often gets painful swelling of her feet. Which resolves in few days with conservative measures.  She might be having some extensor tendinopathy.  Meloxicam once daily if needed, I advised her not to take for more than few days as she has chronic kidney disease. Voltaren Gel  She can try ice wrap, simple Ace wrap. She should follow-up at clinic if her symptoms get worse or if she develops any fever.

## 2016-01-20 NOTE — Progress Notes (Signed)
CC: Swelling and pain of her left foot.  HPI:  [name] is a 45 y.o. with past medical history as listed below, came to the clinic with complaint of swelling and pain in her left foot. She states that she noticed some pain when she woke up Sunday morning, then her left foot got swollen. She is having difficulty on walking. The pain is non radiating, get worse with hanging feet or walking. She tried Aleve, Tylenol and some arthritis gel with minimum relief. She denies any history of trauma, ankle twisting. She states that she often gets painful swelling of her feet. Which resolves in few days with conservative measures. She denies any fever, chills, or shortness of breath.  Past Medical History:  Diagnosis Date  . Abnormal LFTs (liver function tests)    Liver U/S and exam c/w HSM. Hep B serology neg. but Hep C ab +, HIV neg. 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 Acute bronchitis 07/29/2014  . Anemia   . Cardiomyopathy, nonischemic ([place])    a. Varying EF over the years - initially 35% in 09/2010. Normal cors 12/2010. b. Echo 07/2014: EF 55-60%, no RWMA, + diastolic flattening and systolic flattening c/w RV volume and pressure overload, mild LAE/RAE, mod dilated RV, mod TR, PASP severely increased at 38mmHg. c. RHC 07/2014: moderate pulmonary HTN likely WHO group 3 with markedly elevated CVP and relatively normal left sided pressures.  . Chronic diastolic CHF (congestive heart failure) ([place])   . Chronic respiratory failure ([place])    a. became O2 dependent in July 2011. b. She required trache placement in 07/2013 and has been followed by pulmonology as well.  . CKD (chronic kidney disease), stage II   . Complication of anesthesia    " difficult waking "  . Diabetes mellitus    insulin dependent  . Diabetic retinopathy    Right eye 2/11  . Essential hypertension   . Health maintenance examination    Mammogram 05/2010 Negative; Last  Pap smear 03/2008; Last DM eye exam 2/11> mild non-proliferative diabetic retinopathy. OD  . Helicobacter pylori ab+ 05/2011   Pt was symptomatic and treatment planned for 05/2011  . Hx of cardiac cath 2/08   No CAD, no RAS,  normal EF  . Hypokalemia   . Hypomagnesemia   . Hypoxemia    CT angiogram 9/11>> No PE; PFTs 10/11- FEV1 1.20 (49%) with 16% better p B2, DLCO 33%> corrects to 84; O2 sats ok on 4 lpm X rapid walk X 3 laps 05/2010  . Long term current use of systemic steroids   . Morbid obesity ([place])    Target wt= 153 for BMI <30  . Obesity hypoventilation syndrome ([place])   . Pulmonary hypertension ([place])   . QT prolongation   . Sarcoidosis ([place])    Followed by Dr. Melvyn Novas; w/ liver involvement per biopsy 12/09, Reversible airway component so started on [facility] 01/2010; HFA 75% p coaching 05/2010  . Seborrheic dermatitis of scalp   . Sleep apnea   . Tobacco abuse     Review of Systems:   Physical Exam:  Vitals:   01/20/16 1514  BP: (!) 148/90  Pulse: (!) 113  Temp: 98.4 F (36.9 C)  TempSrc: Oral  SpO2: 98%  Weight: 200 lb 8 oz (90.9 kg)  Height: 5\' 1"  (1.549 m)   Gen. Well-built, well-nourished lady, with tracheostomy in place ,in no acute distress  Extremities. She has edema of her right foot, no erythema, no change in temperature. She has tenderness along her lateral dorsum area. Range of movements were restricted due to pain. There was no edema on her legs. Pulses 2+ bilaterally. Lungs. Clear bilaterally CV.RRR.   Assessment & Plan:   See Encounters Tab for problem based charting.  Patient seen with Dr. Angelia Mould.

## 2016-01-21 NOTE — Progress Notes (Signed)
Internal Medicine Clinic Attending  I saw and evaluated the patient.  I personally confirmed the key portions of the history and exam documented by Dr. Amin and I reviewed pertinent patient test results.  The assessment, diagnosis, and plan were formulated together and I agree with the documentation in the resident's note. 

## 2016-02-07 ENCOUNTER — Other Ambulatory Visit (HOSPITAL_COMMUNITY): Payer: Medicare Other

## 2016-02-13 IMAGING — DX DG CHEST 2V
2 series · 2 of 2 positions shown · non-contrast
Comparison: 08/08/2014 and earlier.

CLINICAL DATA: 43-year-old female with acute on chronic diastolic
congestive heart failure. Shortness of Breath. Initial encounter.

EXAM:
CHEST  2 VIEW

[chest pa]
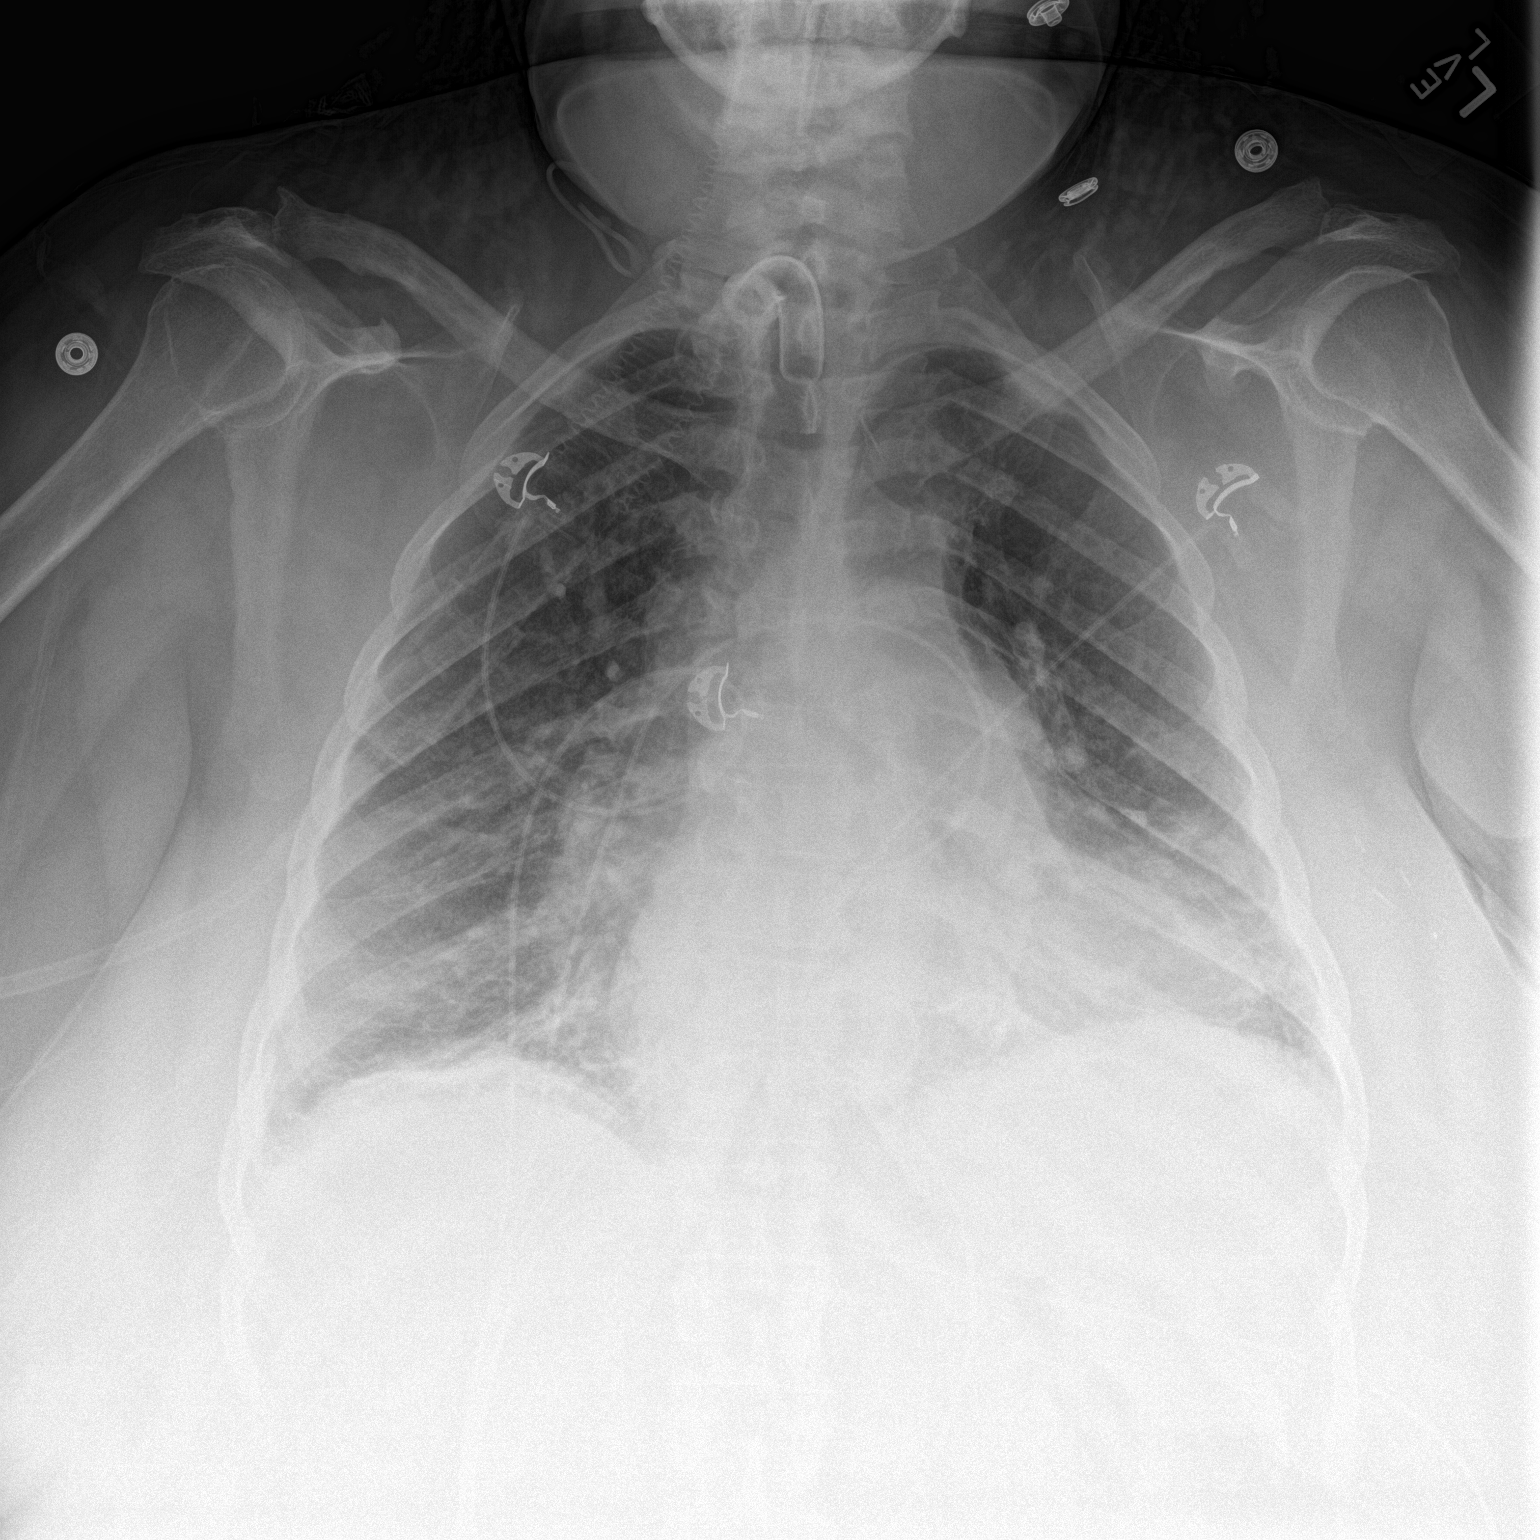

[chest lat]
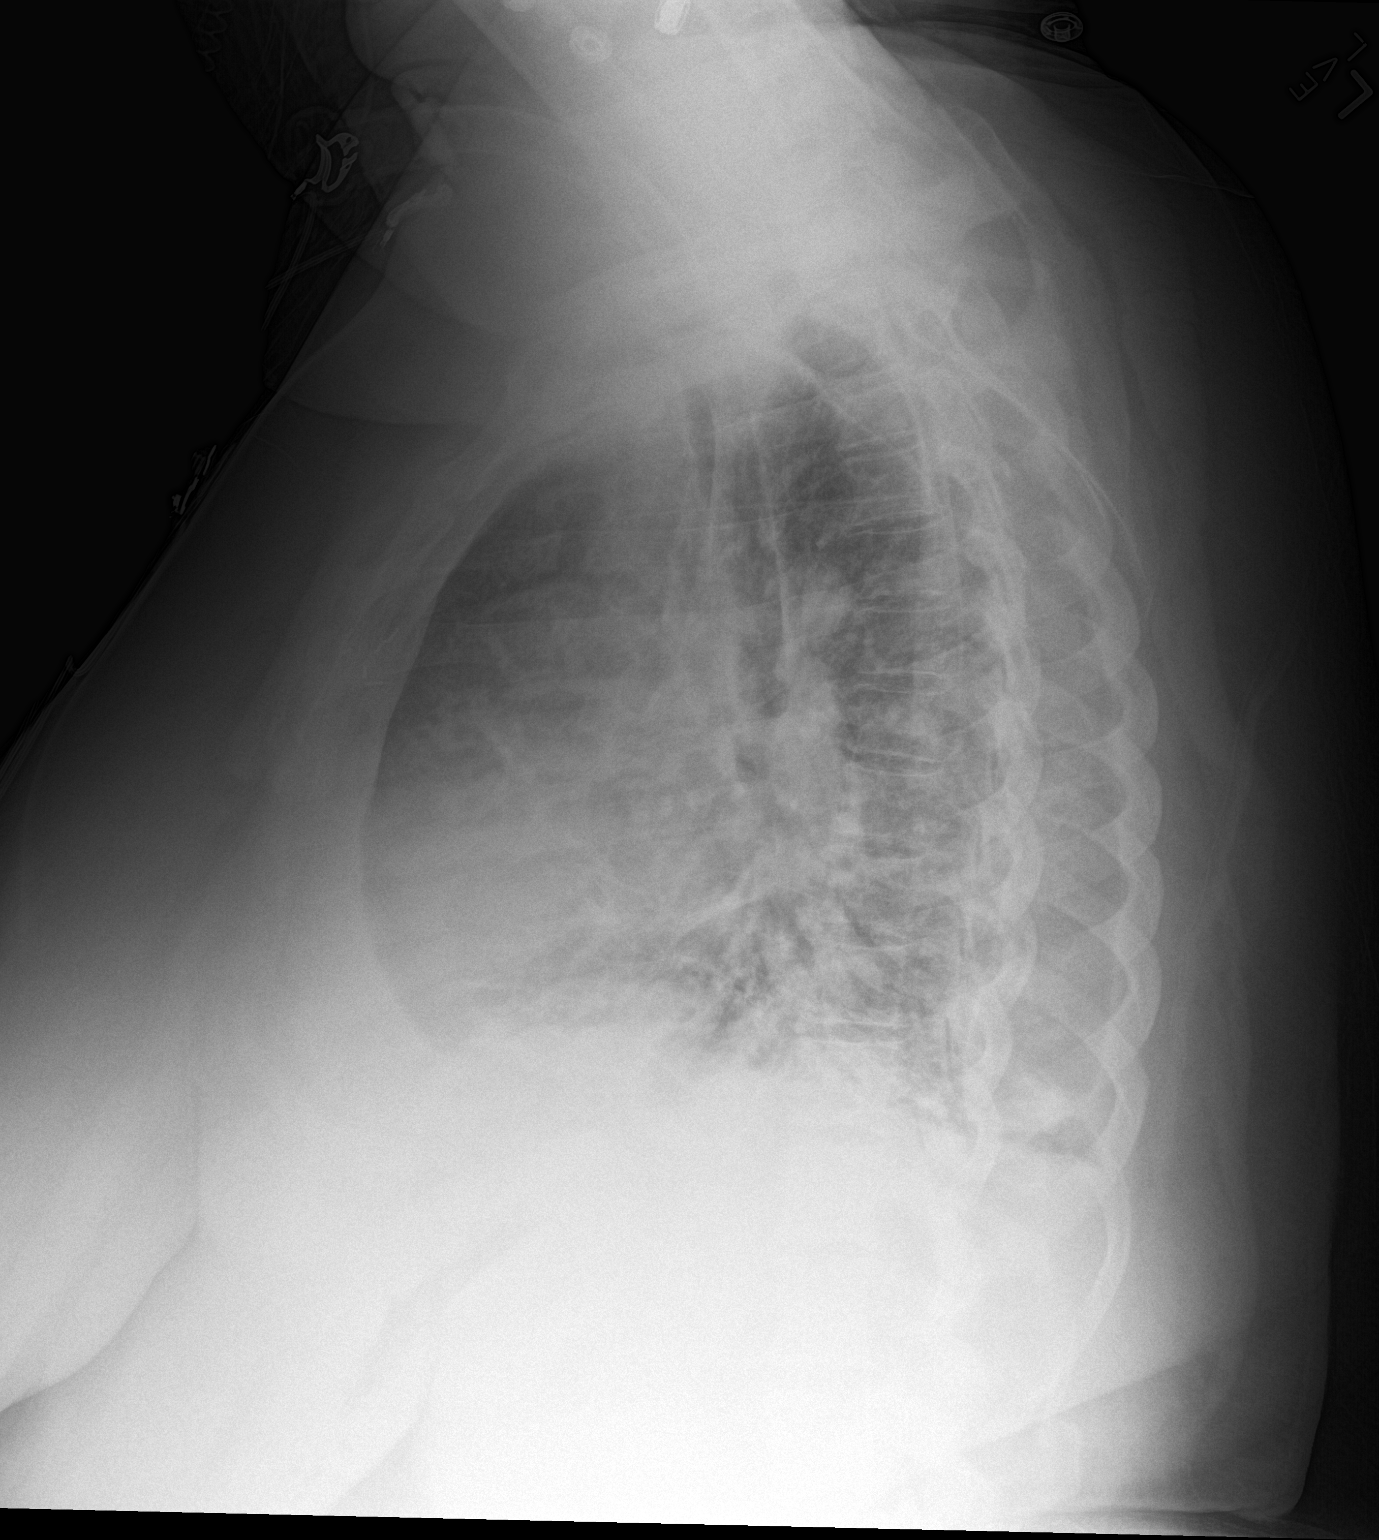

[2 of 2 positions shown; findings below may reference images not displayed]

FINDINGS: Lower lung volumes. Stable cardiomegaly and mediastinal contours.
Tracheostomy remains in place. Mildly increased interstitial and
streaky mid and lower lung opacity. No pneumothorax. No definite
pleural effusion. Stable visualized osseous structures.
IMPRESSION: Streaky and interstitial bilateral mid and lower lung opacity has
mildly progressed. Differential considerations include increased
atelectasis, mild interstitial edema, or less likely bilateral
pneumonia. No pleural effusion identified.

## 2016-02-25 ENCOUNTER — Other Ambulatory Visit: Payer: Self-pay

## 2016-02-25 NOTE — Patient Outreach (Addendum)
     This RNCM received call from patient who was previously case managed by [facility]. Patient requested assistance in a referral to a podiatrist. Patient provided a list of podiatrist in the [facility] area. Patient chose [facility].  Dr. Randell Patient, MD notified of request made by patient. Patient denies need for further assistance with case management at this time.

## 2016-03-01 ENCOUNTER — Encounter: Payer: Self-pay | Admitting: Pulmonary Disease

## 2016-03-03 ENCOUNTER — Other Ambulatory Visit: Payer: Self-pay | Admitting: Internal Medicine

## 2016-03-06 ENCOUNTER — Other Ambulatory Visit: Payer: Self-pay | Admitting: Pulmonary Disease

## 2016-03-06 MED ORDER — MELOXICAM 7.5 MG PO TABS: 7.5000 mg | ORAL_TABLET | Freq: Two times a day (BID) | ORAL | 0 refills | Status: DC | PRN

## 2016-03-07 ENCOUNTER — Encounter: Payer: Self-pay | Admitting: Podiatry

## 2016-03-07 ENCOUNTER — Ambulatory Visit (INDEPENDENT_AMBULATORY_CARE_PROVIDER_SITE_OTHER): Payer: Medicare Other | Admitting: Podiatry

## 2016-03-07 VITALS — BP 156/81 | HR 97 | Resp 14 | Ht 61.0 in | Wt 205.0 lb

## 2016-03-07 DIAGNOSIS — Q6650 Congenital pes planus, unspecified foot: Secondary | ICD-10-CM

## 2016-03-07 DIAGNOSIS — M722 Plantar fascial fibromatosis: Secondary | ICD-10-CM

## 2016-03-07 DIAGNOSIS — E119 Type 2 diabetes mellitus without complications: Secondary | ICD-10-CM

## 2016-03-07 NOTE — Progress Notes (Signed)
   Subjective:    Patient ID: Melody Gray, female    DOB: 21-Aug-1970, 45 y.o.   MRN: [id number]  HPI   This patient presents today with her daughter present in treatment room. For approximately 1 week she is complaining of pain in the right arch aggravated with standing walking relieved with rest and elevation. She also takes him Tylenol for pain meds on a as-needed basis for her foot pain she uses a walker and a when necessary basis as well. Patient describes no change of activity or direct injury. She describes approximately 3 year history of intermittent pain in the right and left arch area that generally resolves with rest each episode could last for approximately 1 week with remission for approximately a month and then recurrence of the arch pain.. She said that she's had it evaluated by her primary care physician and did not understand the exact nature of her problem and would like a specific diagnosis.  Patient is diabetic and denies any history of foot ulceration, claudication or amputation Patient is a smoker Patient currently has a trach she says associated with sleep apnea  Review of Systems  All other systems reviewed and are negative.      Objective:   Physical Exam  Patient states she is approximately 5 foot 1 inch Patient states her weight is 205  Orientated 3  Vascular: DP and PT pulses 2/4 bilaterally Capillary reflex immediate bilaterally No calf pain or calf tenderness bilaterally No peripheral edema bilaterally  Neurological: Sensation to 10 g monofilament wire intact 5/5 bilaterally Vibratory sensation reactive bilaterally Ankle reflex equal and reactive bilaterally  Dermatological: No open skin lesions bilaterally Dry skin plantarly bilaterally  Musculoskeletal: Patient has flat-footed gait walking with a walker Palpable tenderness mid section medial plantar fascial right without any palpable lesions. This duplicates patient's pain in the right  foot There is no restriction,, pain, crepitus in the ankle, subtalar, midtarsal joints bilaterally Manual motor testing: Dorsi flexion, plantar flexion, inversion, eversion 5/5 bilaterally     Assessment & Plan:   Assessment: Diabetic without foot complications Protective sensation intact Vascular status adequate Plantar fasciitis right  Plan: I reviewed the results of exam with patient today with her daughter present to treatment room. Informed her that she had plantar fasciitis. I described this for her detail. I recommended that she wear an athletic lace up style shoes that of the slippers that she wears. Also, I described using 2 inch sports tape over prewrap to bind the right arch or left arch as needed We also discussed stretching  Reappoint at patient's request Protective sensation intact

## 2016-03-07 NOTE — Patient Instructions (Signed)
Today we discussed your arch pain consistent with plantar fasciitis Please wear athletic walking or running style shoes all the time We also discussed using 2 inch sports tape over prewrap to support the arch on the right foot Stretching discussed included: Before changing from a seated to standing position toes toward your nose While standing with your shoes on: With knee straight leaning against the wall for 30-60 seconds often as needed  Reappoint at your request   Plantar Fasciitis Plantar fasciitis is a painful foot condition that affects the heel. It occurs when the band of tissue that connects the toes to the heel bone (plantar fascia) becomes irritated. This can happen after exercising too much or doing other repetitive activities (overuse injury). The pain from plantar fasciitis can range from mild irritation to severe pain that makes it difficult for you to walk or move. The pain is usually worse in the morning or after you have been sitting or lying down for a while. CAUSES This condition may be caused by:  Standing for long periods of time.  Wearing shoes that do not fit.  Doing high-impact activities, including running, aerobics, and ballet.  Being overweight.  Having an abnormal way of walking (gait).  Having tight calf muscles.  Having high arches in your feet.  Starting a new athletic activity. SYMPTOMS The main symptom of this condition is heel pain. Other symptoms include:  Pain that gets worse after activity or exercise.  Pain that is worse in the morning or after resting.  Pain that goes away after you walk for a few minutes. DIAGNOSIS This condition may be diagnosed based on your signs and symptoms. Your health care provider will also do a physical exam to check for:  A tender area on the bottom of your foot.  A high arch in your foot.  Pain when you move your foot.  Difficulty moving your foot. You may also need to have imaging studies to  confirm the diagnosis. These can include:  X-rays.  Ultrasound.  MRI. TREATMENT  Treatment for plantar fasciitis depends on the severity of the condition. Your treatment may include:  Rest, ice, and over-the-counter pain medicines to manage your pain.  Exercises to stretch your calves and your plantar fascia.  A splint that holds your foot in a stretched, upward position while you sleep (night splint).  Physical therapy to relieve symptoms and prevent problems in the future.  Cortisone injections to relieve severe pain.  Extracorporeal shock wave therapy (ESWT) to stimulate damaged plantar fascia with electrical impulses. It is often used as a last resort before surgery.  Surgery, if other treatments have not worked after 12 months. HOME CARE INSTRUCTIONS  Take medicines only as directed by your health care provider.  Avoid activities that cause pain.  Roll the bottom of your foot over a bag of ice or a bottle of cold water. Do this for 20 minutes, 3-4 times a day.  Perform simple stretches as directed by your health care provider.  Try wearing athletic shoes with air-sole or gel-sole cushions or soft shoe inserts.  Wear a night splint while sleeping, if directed by your health care provider.  Keep all follow-up appointments with your health care provider. PREVENTION   Do not perform exercises or activities that cause heel pain.  Consider finding low-impact activities if you continue to have problems.  Lose weight if you need to. The best way to prevent plantar fasciitis is to avoid the activities that aggravate  your plantar fascia. SEEK MEDICAL CARE IF:  Your symptoms do not go away after treatment with home care measures.  Your pain gets worse.  Your pain affects your ability to move or do your daily activities.   This information is not intended to replace advice given to you by your health care provider. Make sure you discuss any questions you have with your  health care provider.   Document Released: 01/10/2001 Document Revised: 01/06/2015 Document Reviewed: 02/25/2014 Elsevier Interactive Patient Education 2016 [place].   Diabetes and Foot Care Diabetes may cause you to have problems because of poor blood supply (circulation) to your feet and legs. This may cause the skin on your feet to become thinner, break easier, and heal more slowly. Your skin may become dry, and the skin may peel and crack. You may also have nerve damage in your legs and feet causing decreased feeling in them. You may not notice minor injuries to your feet that could lead to infections or more serious problems. Taking care of your feet is one of the most important things you can do for yourself.  HOME CARE INSTRUCTIONS  Wear shoes at all times, even in the house. Do not go barefoot. Bare feet are easily injured.  Check your feet daily for blisters, cuts, and redness. If you cannot see the bottom of your feet, use a mirror or ask someone for help.  Wash your feet with warm water (do not use hot water) and mild soap. Then pat your feet and the areas between your toes until they are completely dry. Do not soak your feet as this can dry your skin.  Apply a moisturizing lotion or petroleum jelly (that does not contain alcohol and is unscented) to the skin on your feet and to dry, brittle toenails. Do not apply lotion between your toes.  Trim your toenails straight across. Do not dig under them or around the cuticle. File the edges of your nails with an emery board or nail file.  Do not cut corns or calluses or try to remove them with medicine.  Wear clean socks or stockings every day. Make sure they are not too tight. Do not wear knee-high stockings since they may decrease blood flow to your legs.  Wear shoes that fit properly and have enough cushioning. To break in new shoes, wear them for just a few hours a day. This prevents you from injuring your feet. Always look in  your shoes before you put them on to be sure there are no objects inside.  Do not cross your legs. This may decrease the blood flow to your feet.  If you find a minor scrape, cut, or break in the skin on your feet, keep it and the skin around it clean and dry. These areas may be cleansed with mild soap and water. Do not cleanse the area with peroxide, alcohol, or iodine.  When you remove an adhesive bandage, be sure not to damage the skin around it.  If you have a wound, look at it several times a day to make sure it is healing.  Do not use heating pads or hot water bottles. They may burn your skin. If you have lost feeling in your feet or legs, you may not know it is happening until it is too late.  Make sure your health care provider performs a complete foot exam at least annually or more often if you have foot problems. Report any cuts, sores, or bruises  to your health care provider immediately. SEEK MEDICAL CARE IF:   You have an injury that is not healing.  You have cuts or breaks in the skin.  You have an ingrown nail.  You notice redness on your legs or feet.  You feel burning or tingling in your legs or feet.  You have pain or cramps in your legs and feet.  Your legs or feet are numb.  Your feet always feel cold. SEEK IMMEDIATE MEDICAL CARE IF:   There is increasing redness, swelling, or pain in or around a wound.  There is a red line that goes up your leg.  Pus is coming from a wound.  You develop a fever or as directed by your health care provider.  You notice a bad smell coming from an ulcer or wound.   This information is not intended to replace advice given to you by your health care provider. Make sure you discuss any questions you have with your health care provider.   Document Released: 04/14/2000 Document Revised: 12/18/2012 Document Reviewed: 09/24/2012 Elsevier Interactive Patient Education [employer].

## 2016-03-16 ENCOUNTER — Ambulatory Visit (HOSPITAL_COMMUNITY)
Admission: RE | Admit: 2016-03-16 | Discharge: 2016-03-16 | Disposition: A | Discharge status: Home / Self Care | Payer: Medicare Other | Source: Ambulatory Visit | Attending: Acute Care | Admitting: Acute Care

## 2016-03-16 DIAGNOSIS — Z430 Encounter for attention to tracheostomy: Secondary | ICD-10-CM | POA: Insufficient documentation

## 2016-03-16 NOTE — Progress Notes (Signed)
Tracheostomy Procedure Note  Melody Gray [id number] October 07, 1970  Pre Procedure Tracheostomy Information  Trach Brand: Shiley Size: 4.0 cuffless Style: Uncuffed Secured by: Velcro   Procedure: routine trach visit, BP 154/88,RR 16, Pulse 99, Sat 100% RA    Post Procedure Tracheostomy Information  Trach Brand: Shiley Size: 4.0 cuffless Style: Uncuffed Secured by: Velcro   Post Procedure Evaluation:  ETCO2 positive color change from yellow to purple : Yes.   Vital signs:blood pressure 148/79, pulse 97, respirations 16 and pulse oximetry 100% RA Patients current condition: stable Complications: No apparent complications Trach site exam: clean, dry Wound care done: dry, clean and 4 x 4 gauze Patient did tolerate procedure well.   Education: none  Prescription none    Additional nnone

## 2016-04-13 ENCOUNTER — Encounter: Payer: Self-pay | Admitting: Licensed Clinical Social Worker

## 2016-04-13 ENCOUNTER — Ambulatory Visit (INDEPENDENT_AMBULATORY_CARE_PROVIDER_SITE_OTHER): Payer: Medicare Other | Admitting: Pulmonary Disease

## 2016-04-13 ENCOUNTER — Encounter: Payer: Self-pay | Admitting: Pulmonary Disease

## 2016-04-13 ENCOUNTER — Telehealth: Payer: Self-pay | Admitting: Dietician

## 2016-04-13 VITALS — BP 141/86 | HR 112 | Temp 98.1°F | Wt 206.9 lb

## 2016-04-13 DIAGNOSIS — F17210 Nicotine dependence, cigarettes, uncomplicated: Secondary | ICD-10-CM

## 2016-04-13 DIAGNOSIS — J3489 Other specified disorders of nose and nasal sinuses: Secondary | ICD-10-CM

## 2016-04-13 DIAGNOSIS — Z79899 Other long term (current) drug therapy: Secondary | ICD-10-CM | POA: Diagnosis not present

## 2016-04-13 DIAGNOSIS — J0110 Acute frontal sinusitis, unspecified: Secondary | ICD-10-CM

## 2016-04-13 DIAGNOSIS — J9611 Chronic respiratory failure with hypoxia: Secondary | ICD-10-CM | POA: Diagnosis not present

## 2016-04-13 DIAGNOSIS — E119 Type 2 diabetes mellitus without complications: Secondary | ICD-10-CM

## 2016-04-13 DIAGNOSIS — Z794 Long term (current) use of insulin: Secondary | ICD-10-CM | POA: Diagnosis not present

## 2016-04-13 DIAGNOSIS — E113299 Type 2 diabetes mellitus with mild nonproliferative diabetic retinopathy without macular edema, unspecified eye: Secondary | ICD-10-CM

## 2016-04-13 DIAGNOSIS — Z9981 Dependence on supplemental oxygen: Secondary | ICD-10-CM

## 2016-04-13 DIAGNOSIS — I10 Essential (primary) hypertension: Secondary | ICD-10-CM

## 2016-04-13 LAB — POCT GLYCOSYLATED HEMOGLOBIN (HGB A1C): Hemoglobin A1C: 7.3

## 2016-04-13 LAB — GLUCOSE, CAPILLARY: Glucose-Capillary: 143 mg/dL — ABNORMAL HIGH (ref 65–99)

## 2016-04-13 MED ORDER — AZITHROMYCIN 250 MG PO TABS: ORAL_TABLET | ORAL | 0 refills | Status: DC

## 2016-04-13 MED ORDER — PREDNISONE 20 MG PO TABS: 40.0000 mg | ORAL_TABLET | Freq: Every day | ORAL | 0 refills | Status: DC

## 2016-04-13 MED ORDER — FLUTICASONE PROPIONATE 50 MCG/ACT NA SUSP: 2.0000 | Freq: Every day | NASAL | 0 refills | Status: DC

## 2016-04-13 NOTE — Progress Notes (Signed)
CSW received message from pt's [place].  Pt has noticed need for in-home aid/PCS.  Ms. Fairbank is scheduled for [facility]/PCP appointment today.  CSW will initiate DMA 1595 and place in PCP's mailbox.

## 2016-04-13 NOTE — Progress Notes (Signed)
CC: cough  HPI:  Melody Gray is a 45 y.o. woman with sarcoidosis, chronic respiratory failure with hypoxemia on home 2L O2, HTN, nonischemic cardiomyopathy, OSA, DM presenting for follow up of her DM and evaluation of cough.   She has a cough that started two weeks ago. Productive of yellow to white thick sputum. A lot of her family members have had colds. Only minimal improvement over time. She has not had to use a ventilator for a long time. She had subjective fevers but that has resolved. No chills. She has dyspnea. No chest pain. No nausea/vomiting. No myalgias. She has been off of prednisone for 2 years.  +rhinorrhea Some sinus pressure Denies post nasal drip Sometimes has gag with cough and very loud   Past Medical History:  Diagnosis Date  . Abnormal LFTs (liver function tests)    Liver U/S and exam c/w HSM. Hep B serology neg. but Hep C ab +, HIV neg. 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 Acute bronchitis 07/29/2014  . Anemia   . Cardiomyopathy, nonischemic ([place])    a. Varying EF over the years - initially 35% in 09/2010. Normal cors 12/2010. b. Echo 07/2014: EF 55-60%, no RWMA, + diastolic flattening and systolic flattening c/w RV volume and pressure overload, mild LAE/RAE, mod dilated RV, mod TR, PASP severely increased at 64mmHg. c. RHC 07/2014: moderate pulmonary HTN likely WHO group 3 with markedly elevated CVP and relatively normal left sided pressures.  . Chronic diastolic CHF (congestive heart failure) ([place])   . Chronic respiratory failure ([place])    a. became O2 dependent in July 2011. b. She required trache placement in 07/2013 and has been followed by pulmonology as well.  . CKD (chronic kidney disease), stage II   . Complication of anesthesia    " difficult waking "  . Diabetes mellitus    insulin dependent  . Diabetic retinopathy    Right eye 2/11  . Essential hypertension   . Health maintenance examination      Mammogram 05/2010 Negative; Last Pap smear 03/2008; Last DM eye exam 2/11> mild non-proliferative diabetic retinopathy. OD  . Helicobacter pylori ab+ 05/2011   Pt was symptomatic and treatment planned for 05/2011  . Hx of cardiac cath 2/08   No CAD, no RAS,  normal EF  . Hypokalemia   . Hypomagnesemia   . Hypoxemia    CT angiogram 9/11>> No PE; PFTs 10/11- FEV1 1.20 (49%) with 16% better p B2, DLCO 33%> corrects to 84; O2 sats ok on 4 lpm X rapid walk X 3 laps 05/2010  . Long term current use of systemic steroids   . Morbid obesity ([place])    Target wt= 153 for BMI <30  . Obesity hypoventilation syndrome ([place])   . Pulmonary hypertension   . QT prolongation   . Sarcoidosis ([place])    Followed by Dr. Melvyn Novas; w/ liver involvement per biopsy 12/09, Reversible airway component so started on [facility] 01/2010; HFA 75% p coaching 05/2010  . Seborrheic dermatitis of scalp   . Sleep apnea   . Tobacco abuse     Review of Systems:   No dysuria No lightheadedness or dizziness  Physical Exam:  Vitals:   04/13/16 1427  BP: (!) 141/86  Pulse: (!) 112  Temp: 98.1 F (36.7 C)  TempSrc: Oral  SpO2: 97%  Weight: 206 lb 14.4 oz (93.8 kg)   General Apperance: NAD  HEENT: Normocephalic, atraumatic, anicteric sclera Neck: Supple, trachea midline Lungs: Few wheezes. No rhonchi or rales. Breathing comfortably Heart: Regular rate and rhythm, no murmur/rub/gallop Abdomen: Soft, nontender, nondistended, no rebound/guarding Extremities: Warm and well perfused, no edema Skin: No rashes or lesions Neurologic: Alert and interactive. No gross deficits.  Assessment & Plan:   See Encounters Tab for problem based charting.  Patient discussed with Dr. Beryle Beams

## 2016-04-13 NOTE — Patient Instructions (Addendum)
Take your antibiotics as prescribed Take prednisone 2 tablets daily for 5 days Use fluticasone nasal spray daily You may do saline nasal rinses by either buying it over the counter at any pharmacy or following the instructions below Follow up in 6-8 weeks  NASAL STEROID USE INSTRUCTIONS  Step 1. Prepare the nose. Blow the nose before administering the drug.  Step 2. Prime and activate the delivery device as recommended by the manufacturer.  Step 3. Position the head by tilting the head forward.  Step 4. Insert the tip of the applicator gently, avoiding contact with the septum.  Step 5. Aim the applicator tip about 45 from the floor of the nose and direct it at the outer corner of the eye on the same side to avoid traumatizing or spraying the septum.  Step 6. Close the other nostril gently with a finger.   Step 7. Sniff or inhale gently while delivering the drug.  BUFFERED ISOTONIC SALINE NASAL IRRIGATION  The Benefits:  1. When you irrigate, the isotonic saline (salt water) acts as a solvent and washes the mucus crusts and other debris from your nose.  2. This decongests and improves the airflow into your nose. The sinus passages begin to open.  3. Studies have also shown that a salt water and an alkaline (baking soda) irrigation solution improves nasal membrane cell function (mucociliary flow of mucus debris).  The Recipe:  1. Choose a 1-quart glass jar that is thoroughly cleansed.  2. Fill with sterile or distilled water, or you can boil water from the tap.  3. Add 1 to 2 heaping teaspoons of "pickling/canning/sea" salt (NOT table salt as it contains a large number of additives). This salt is available at the grocery store in the food canning section.  4. Add 1 teaspoon of [employer] Hammer Baking Soda (pure bicarbonate).  5. Mix ingredients together and store at room temperature. Discard after one week. If you find this solution too strong, you may decrease the amount of salt  added to 1 to 1  teaspoons. With children it is often best to start with a milder solution and advance slowly. Irrigate with 240 ml (8 oz) twice daily.  The Instructions:  You should plan to irrigate your nose with buffered isotonic saline 2 times per day. Many people prefer to warm the solution slightly in the microwave - but be sure that the solution is NOT HOT. Stand over the sink (some do this in the shower) and squirt the solution into each side of your nose, keeping your mouth open. This allows you to spit the saltwater out of your mouth. It will not harm you if you swallow a little.  If you have been told to use a nasal steroid such as Flonase, Nasonex, or Nasacort, you should always use isortonic saline solution first, then use your nasal steroid product. The nasal steroid is much more effective when sprayed onto clean nasal membranes and the steroid medicine will reach deeper into the nose.  Most people experience a little burning sensation the first few times they use a isotonic saline solution, but this usually goes away within a few days.

## 2016-04-13 NOTE — Telephone Encounter (Signed)
Steroid-Induced Hyperglycemia Prevention and Management Earlean S Emmons is a 45 y.o. female who meets criteria for [facility] quality improvement program (diabetes patient prescribed short course of steroids).  A/P Current Regimen  Patient prescribed prednisone 40 mg daily x 5 days, currently on day  of therapy. Patient taking prednisone in the AM  Prednisone indication: cold with increased secretions and trach dependent  Current DM regimen 46 units of levemir and 1.2 mcg victoza, metformin  Home BG Monitoring  Patient does have a meter at home and does check BG at home. Meter was not supplied.  CBGs at home has not been checking   CBGs prior to steroid course she does not know  A1C prior to steroid course 7.3  S/Sx of hyper- or hypoglycemia: none, none  Medication Management  Switch prednisone dose to AM not applicable  Additional treatment for BG control is not indicated at this time.    Patient Education  Advised patient to monitor BG while on steroid therapy (at least twice daily prior to first 2 meals of the day).  Patient educated about signs/symptoms and advised to contact clinic if hyper- or hypoglycemic.  Patient did  verbalize understanding of information and regimen by repeating back topics discussed.  Follow-up tomorrow afternoon by phone  2 weeks recommended per protocol  P[place]ler, Butch Penny 5:24 PM 04/13/2016

## 2016-04-14 DIAGNOSIS — J019 Acute sinusitis, unspecified: Secondary | ICD-10-CM | POA: Insufficient documentation

## 2016-04-14 NOTE — Assessment & Plan Note (Addendum)
Assessment: Cough for past 2 weeks with rhinorrhea, sinus pressure. Consistent with sinusitis. Could also be acute bronchitis. Some wheezing noted on exam. She has reactive airway disease.  Plan: Prednisone 40mg  daily for five days Azithromycin 500mg  then 250 daily for 5 days total Continue albuterol Follow up in 6 weeks. Repeat CXR at that time given last CXR had some abnormalities. Restart Flonase and recommended sinus rinses since this is possibly sinusitis.

## 2016-04-14 NOTE — Assessment & Plan Note (Signed)
Assessment: A1c improved from 7.8 to 7.3  Plan: Continue Levemir 46units daily, metformin 1000mg  BID, Victoza 1.2mg  daily

## 2016-04-14 NOTE — Progress Notes (Signed)
Medicine attending: Medical history, presenting problems, physical findings, and medications, reviewed with resident physician Dr Jennifer Krall on the day of the patient visit and I concur with her evaluation and management plan. 

## 2016-04-14 NOTE — Telephone Encounter (Signed)
Forgot to take blood sugar, took it while we were on the phone: result was 412 mg/dl . She agreed to check it twice a day She took the prednisone at 1230 PM- and took her victoza. Plans to take insulin tonight.  Plan: move insulin to am; increase by 4-6 units as her sugar is usually high on steroids.  Asked her to take 24 units Levemir tonight and 26 units tomorrow morning and then 50 units Sunday and Monday mornings.  She was informed how to reach our doctors on call. She also repeated back her instructions and wrote them down as well.  Follow up:  to talk with her Monday.

## 2016-04-14 NOTE — Assessment & Plan Note (Signed)
Assessment: BP improved to 141/86  Plan: Continue carvedilol 3.125mg  BID.

## 2016-04-17 NOTE — Telephone Encounter (Signed)
Steroid Induced Hyperglycemia Prevention follow up:  Called patient . She has not taken insulin but took 50 units Levemir yesterday. She says she has quite a few prednisone pills left and she is not sure when her last day is. She has only been taking her fasting blood sugar which were: Today: 149 Yesterday: 161 On the 16th 152  P; take 50 units levemir today. Check blood sugar at least one more time today and twice a day while on steroids. Call tomorrow

## 2016-04-18 NOTE — Telephone Encounter (Signed)
She has 5 days of steroids

## 2016-04-18 NOTE — Telephone Encounter (Signed)
This am: 100s, this afternoon it was 200s before lunch today. No checks yesterday  took 50 units this am   she says her bottle says 10 tablets, but there were more than 10 in the bottle, still has 12 pills left of 20 mg each and has been taking them since the 14th making today day 6. . Advised to her stop taking the prednisone and go back to her regular insulin dose tomorrow. She verbalized understanding.

## 2016-04-20 ENCOUNTER — Other Ambulatory Visit (HOSPITAL_COMMUNITY): Payer: Medicare Other

## 2016-05-08 ENCOUNTER — Encounter: Payer: Self-pay | Admitting: Radiology

## 2016-05-29 ENCOUNTER — Ambulatory Visit (HOSPITAL_COMMUNITY): Payer: Medicare Other | Attending: Cardiology

## 2016-06-08 ENCOUNTER — Encounter: Payer: Self-pay | Admitting: Pulmonary Disease

## 2016-06-08 ENCOUNTER — Encounter: Payer: Medicare Other | Admitting: Pulmonary Disease

## 2016-06-14 ENCOUNTER — Ambulatory Visit (HOSPITAL_COMMUNITY): Payer: Medicare Other

## 2016-06-21 ENCOUNTER — Encounter: Payer: Self-pay | Admitting: Pulmonary Disease

## 2016-06-21 ENCOUNTER — Other Ambulatory Visit: Payer: Self-pay | Admitting: *Deleted

## 2016-06-21 ENCOUNTER — Inpatient Hospital Stay (HOSPITAL_COMMUNITY): Admission: RE | Admit: 2016-06-21 | Payer: Medicare Other | Source: Ambulatory Visit

## 2016-06-21 DIAGNOSIS — Z794 Long term (current) use of insulin: Secondary | ICD-10-CM

## 2016-06-21 DIAGNOSIS — E0800 Diabetes mellitus due to underlying condition with hyperosmolarity without nonketotic hyperglycemic-hyperosmolar coma (NKHHC): Secondary | ICD-10-CM

## 2016-06-21 MED ORDER — MELOXICAM 7.5 MG PO TABS: 7.5000 mg | ORAL_TABLET | Freq: Two times a day (BID) | ORAL | 0 refills | Status: DC | PRN

## 2016-06-21 MED ORDER — LIRAGLUTIDE 18 MG/3ML ~~LOC~~ SOPN: 1.2000 mg | PEN_INJECTOR | Freq: Every day | SUBCUTANEOUS | 2 refills | Status: DC

## 2016-06-21 MED ORDER — CARVEDILOL 3.125 MG PO TABS: 3.1250 mg | ORAL_TABLET | Freq: Two times a day (BID) | ORAL | 0 refills | Status: DC

## 2016-06-25 ENCOUNTER — Other Ambulatory Visit: Payer: Self-pay | Admitting: Pulmonary Disease

## 2016-06-26 NOTE — Telephone Encounter (Signed)
I thought I sent in a refill on the 21st?

## 2016-06-28 ENCOUNTER — Ambulatory Visit (HOSPITAL_COMMUNITY)
Admission: RE | Admit: 2016-06-28 | Discharge: 2016-06-28 | Disposition: A | Discharge status: Home / Self Care | Payer: Medicare Other | Source: Ambulatory Visit | Attending: Acute Care | Admitting: Acute Care

## 2016-06-28 DIAGNOSIS — Z930 Tracheostomy status: Secondary | ICD-10-CM

## 2016-06-28 DIAGNOSIS — Z430 Encounter for attention to tracheostomy: Secondary | ICD-10-CM | POA: Insufficient documentation

## 2016-06-28 DIAGNOSIS — G4733 Obstructive sleep apnea (adult) (pediatric): Secondary | ICD-10-CM | POA: Insufficient documentation

## 2016-06-28 NOTE — Progress Notes (Signed)
Tracheostomy Procedure Note  Melody Gray [id number] 1971-04-13  Pre Procedure Tracheostomy Information  Trach Brand: Shiley Size: 4.0 Style: Uncuffed Secured by: Velcro   Procedure: trach change    Post Procedure Tracheostomy Information  Trach Brand: [place] Size: 5.0 Style: Uncuffed Secured by: Velcro   Post Procedure Evaluation:  ETCO2 positive color change from yellow to purple : Yes.   Vital signs:blood pressure 186/101, pulse 109, respirations 15 and pulse oximetry 100 % Patients current condition: stable Complications: No apparent complications Trach site exam: clean, dry Wound care done: dry Patient did tolerate procedure well.   Education:   Prescription needs:     Additional needs:

## 2016-06-30 ENCOUNTER — Telehealth: Payer: Self-pay | Admitting: Acute Care

## 2016-07-01 NOTE — Progress Notes (Signed)
   Subjective:  ROV   Patient ID: Melody Gray, female    DOB: 1971-02-26, 46 y.o.   MRN: [id number]  HPI Melody Gray returns today for routine office visit. She has been chronically trach dependent in the setting of severe sleep apnea. She has thrived with the trach and is doing very well. She report to trach clinic today for trach change.    Review of Systems  All other systems reviewed and are negative.     Vital signs:blood pressure 186/101, pulse 109, respirations 15 and pulse oximetry 100 % Objective:   Physical Exam  Constitutional: She is oriented to person, place, and time. She appears well-developed and well-nourished. No distress.  HENT:  Head: Normocephalic and atraumatic.  Mouth/Throat: Oropharynx is clear and moist.  Eyes: Conjunctivae and EOM are normal. Pupils are equal, round, and reactive to light.  Neck: Normal range of motion. Neck supple.  Trach stoma is clean and no discharge.   Cardiovascular: Normal rate, regular rhythm and normal heart sounds.  Exam reveals no gallop and no friction rub.   No murmur heard. Pulmonary/Chest: Effort normal and breath sounds normal. No respiratory distress.  Abdominal: Soft. Bowel sounds are normal. She exhibits no distension.  Musculoskeletal: Normal range of motion. She exhibits no edema or deformity.  Neurological: She is alert and oriented to person, place, and time.  Skin: Skin is warm and dry. She is not diaphoretic. No erythema.  Psychiatric: She has a normal mood and affect. Her behavior is normal.   Procedure  Trach change Changed from #4 cuffless shiley to cuffless #5 portex.     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 This does not have internal cannula. Will trial this to see if it is easier to manage and more comfortable.   Plan ROV 8 weeks Call PRN  [name] ACNP-BC [place] Pager #  [phone] OR # [phone] if no answer

## 2016-07-19 ENCOUNTER — Other Ambulatory Visit: Payer: Self-pay | Admitting: Pulmonary Disease

## 2016-07-27 ENCOUNTER — Other Ambulatory Visit: Payer: Self-pay | Admitting: *Deleted

## 2016-07-27 NOTE — Telephone Encounter (Signed)
done

## 2016-08-02 ENCOUNTER — Encounter: Payer: Self-pay | Admitting: Cardiology

## 2016-08-23 ENCOUNTER — Ambulatory Visit (HOSPITAL_COMMUNITY)
Admission: RE | Admit: 2016-08-23 | Discharge: 2016-08-23 | Disposition: A | Discharge status: Home / Self Care | Payer: Medicare Other | Source: Ambulatory Visit | Attending: Acute Care | Admitting: Acute Care

## 2016-08-23 DIAGNOSIS — Z930 Tracheostomy status: Secondary | ICD-10-CM | POA: Diagnosis not present

## 2016-08-23 DIAGNOSIS — Z4682 Encounter for fitting and adjustment of non-vascular catheter: Secondary | ICD-10-CM | POA: Diagnosis not present

## 2016-08-23 DIAGNOSIS — G4733 Obstructive sleep apnea (adult) (pediatric): Secondary | ICD-10-CM

## 2016-08-23 NOTE — Progress Notes (Signed)
   Subjective:  ROV  Patient ID: Melody Gray, female    DOB: January 28, 1971, 46 y.o.   MRN: [id number]  HPI Melody Gray returns today for routine trach change. No issues since last routine visit. No increase in cough, no increase in shortness of breath. No change in suctioning requirements.   Review of Systems  Constitutional: Negative.   HENT: Negative.   Eyes: Negative.   Respiratory: Negative.   Cardiovascular: Negative.   Gastrointestinal: Negative.   Endocrine: Negative.   Genitourinary: Negative.   Musculoskeletal: Negative.   Neurological: Negative.   Psychiatric/Behavioral: Negative.    186/101, pulse 109, respirations 15 and pulse oximetry 100 %    Objective:   Physical Exam  Constitutional: She is oriented to person, place, and time. She appears well-developed and well-nourished. No distress.  HENT:  Head: Normocephalic and atraumatic.  Neck:  Trach site unremarkable w/out drainage or odor   Cardiovascular: Normal rate, regular rhythm and normal heart sounds.   Pulmonary/Chest: Effort normal and breath sounds normal.  Abdominal: Soft. Bowel sounds are normal. She exhibits no distension.  Musculoskeletal: Normal range of motion. She exhibits no edema or deformity.  Neurological: She is alert and oriented to person, place, and time.  Skin: Skin is warm and dry. She is not diaphoretic.  Psychiatric: She has a normal mood and affect. Her behavior is normal.   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

## 2016-08-23 NOTE — Progress Notes (Signed)
Tracheostomy Procedure Note  Melody Gray [id number] 1970-08-29  Pre Procedure Tracheostomy Information  Trach Brand: Shiley Size: 4.0 Style: Uncuffed Secured by: Velcro   Procedure: trach change    Post Procedure Tracheostomy Information  Trach Brand: Shiley Size: 4.0 Style: Uncuffed Secured by: Velcro   Post Procedure Evaluation:  ETCO2 positive color change from yellow to purple : Yes.   Vital signs:blood pressure 213/94, pulse 96, respirations 18 and pulse oximetry 98 % Patients current condition: stable Complications: No apparent complications Trach site exam: clean Wound care done: 4 x 4 gauze Patient did tolerate procedure well.   Education: none  Prescription needs: none    Additional needs: none

## 2016-08-30 DIAGNOSIS — H04123 Dry eye syndrome of bilateral lacrimal glands: Secondary | ICD-10-CM | POA: Diagnosis not present

## 2016-08-30 DIAGNOSIS — E119 Type 2 diabetes mellitus without complications: Secondary | ICD-10-CM | POA: Diagnosis not present

## 2016-09-29 ENCOUNTER — Other Ambulatory Visit: Payer: Self-pay

## 2016-10-02 NOTE — Telephone Encounter (Signed)
Dr. Randell Patient has no available openings before she graduates end of this month.  Can the patient wait until July and be scheduled with new primary?

## 2016-10-03 MED ORDER — CARVEDILOL 3.125 MG PO TABS: 3.1250 mg | ORAL_TABLET | Freq: Two times a day (BID) | ORAL | 0 refills | Status: DC

## 2016-10-03 NOTE — Telephone Encounter (Signed)
Ok to wait until July

## 2016-10-11 ENCOUNTER — Other Ambulatory Visit: Payer: Self-pay

## 2016-10-11 DIAGNOSIS — E0800 Diabetes mellitus due to underlying condition with hyperosmolarity without nonketotic hyperglycemic-hyperosmolar coma (NKHHC): Secondary | ICD-10-CM

## 2016-10-11 DIAGNOSIS — Z794 Long term (current) use of insulin: Secondary | ICD-10-CM

## 2016-10-11 MED ORDER — LIRAGLUTIDE 18 MG/3ML ~~LOC~~ SOPN: 1.2000 mg | PEN_INJECTOR | Freq: Every day | SUBCUTANEOUS | 0 refills | Status: DC

## 2016-10-11 NOTE — Telephone Encounter (Signed)
Need appointment. Can see new PCP in July

## 2016-10-11 NOTE — Telephone Encounter (Signed)
liraglutide (VICTOZA) 18 MG/3ML SOPN, refill request.

## 2016-11-09 ENCOUNTER — Ambulatory Visit (INDEPENDENT_AMBULATORY_CARE_PROVIDER_SITE_OTHER): Payer: Medicare Other | Admitting: Internal Medicine

## 2016-11-09 VITALS — BP 141/87 | HR 110 | Temp 98.3°F | Wt 215.0 lb

## 2016-11-09 DIAGNOSIS — Z930 Tracheostomy status: Secondary | ICD-10-CM

## 2016-11-09 DIAGNOSIS — E119 Type 2 diabetes mellitus without complications: Secondary | ICD-10-CM | POA: Diagnosis not present

## 2016-11-09 DIAGNOSIS — Z794 Long term (current) use of insulin: Secondary | ICD-10-CM | POA: Diagnosis not present

## 2016-11-09 DIAGNOSIS — E113299 Type 2 diabetes mellitus with mild nonproliferative diabetic retinopathy without macular edema, unspecified eye: Secondary | ICD-10-CM | POA: Diagnosis not present

## 2016-11-09 DIAGNOSIS — M79672 Pain in left foot: Secondary | ICD-10-CM

## 2016-11-09 DIAGNOSIS — Z9114 Patient's other noncompliance with medication regimen: Secondary | ICD-10-CM | POA: Diagnosis not present

## 2016-11-09 DIAGNOSIS — Z79899 Other long term (current) drug therapy: Secondary | ICD-10-CM

## 2016-11-09 DIAGNOSIS — I10 Essential (primary) hypertension: Secondary | ICD-10-CM | POA: Diagnosis not present

## 2016-11-09 DIAGNOSIS — F17210 Nicotine dependence, cigarettes, uncomplicated: Secondary | ICD-10-CM | POA: Diagnosis not present

## 2016-11-09 DIAGNOSIS — Z0000 Encounter for general adult medical examination without abnormal findings: Secondary | ICD-10-CM

## 2016-11-09 LAB — POCT GLYCOSYLATED HEMOGLOBIN (HGB A1C): Hemoglobin A1C: 8

## 2016-11-09 LAB — GLUCOSE, CAPILLARY: Glucose-Capillary: 232 mg/dL — ABNORMAL HIGH (ref 65–99)

## 2016-11-09 MED ORDER — "INSULIN SYRINGE-NEEDLE U-100 30G X 1/2"" 0.5 ML MISC": 1 refills | Status: DC

## 2016-11-09 MED ORDER — INSULIN PEN NEEDLE 31G X 5 MM MISC: 1 refills | Status: DC

## 2016-11-09 MED ORDER — INSULIN DETEMIR 100 UNIT/ML ~~LOC~~ SOLN: 46.0000 [IU] | Freq: Every day | SUBCUTANEOUS | 3 refills | Status: DC

## 2016-11-09 NOTE — Patient Instructions (Addendum)
Follow Up - Return in 1 month for Blood pressure check and labs  Foot Pain - Continue to treat to treat foot pain conservatively with meloxicam and - Refer to podiatrist  Diabetes - Restart Insulin  Hypertension - Continue Carvedilol and start Lisinopril tomorrow

## 2016-11-09 NOTE — Assessment & Plan Note (Signed)
A1C increased to 8.0 from 7.3 after 2 months without insulin. Medication refilled. - Continue Levemir 46units Daily, Metformin 1000mg  BID, Victoza 1.2mg  Daily - Reorder Syringes and Pen Needles

## 2016-11-09 NOTE — Assessment & Plan Note (Signed)
Recurrence of foot pain and swelling. - Continue to treat episodes with conservative therapy including:  - Meloxicam once a day  - Voltaren gel  - OTC arthritis rub (effective per patient)  - Epsom salt foot baths (effective per patient) - Refer to her Podiatrist for further workup

## 2016-11-09 NOTE — Progress Notes (Signed)
CC: Health Maintenance, Refills, Foot Pain  HPI:  [name] is a 46 y.o. Female who has been experiencing intermittent pain and swelling in her feet. She state this has been an ongoing issue for her with the pain recurring at varying location and typically subsiding in 1-2 weeks. She gets relief with meloxicam, Epsom Salt foot soaks, Voltaren gel, and an arthritis rub. This most recent episode started just a couple of days ago with pain located on the lateral aspect of the dorsum of her left foot.  Hypertension: Her blood pressure remains mildly elevated today at 141/87 on carvedilol. She has previously been on lisinopril, but states another doctor stopped it because he did not like what it did to her kidneys. We discussed restarting this medication pending the results of a BMP with plans for follow up in 4 weeks to recheck blood pressure and BMP.  Diabetes: She ran out of her insulin at least 2 months ago, subsequently her A1C today was 8.0, up from 7.3 at her last visit. She states she tried to had the prescription called in earlier, but was unsuccessful. She also requested syringes and pen needles. Diabetic foot exam normal today.  Past Medical History:  Diagnosis Date  . Abnormal LFTs (liver function tests)    Liver U/S and exam c/w HSM. Hep B serology neg. but Hep C ab +, HIV neg. 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 Acute bronchitis 07/29/2014  . Anemia   . Cardiomyopathy, nonischemic ([place])    a. Varying EF over the years - initially 35% in 09/2010. Normal cors 12/2010. b. Echo 07/2014: EF 55-60%, no RWMA, + diastolic flattening and systolic flattening c/w RV volume and pressure overload, mild LAE/RAE, mod dilated RV, mod TR, PASP severely increased at 52mmHg. c. RHC 07/2014: moderate pulmonary HTN likely WHO group 3 with markedly elevated CVP and relatively normal left sided pressures.  . Chronic diastolic CHF (congestive heart  failure) ([place])   . Chronic respiratory failure ([place])    a. became O2 dependent in July 2011. b. She required trache placement in 07/2013 and has been followed by pulmonology as well.  . CKD (chronic kidney disease), stage II   . Complication of anesthesia    " difficult waking "  . Diabetes mellitus    insulin dependent  . Diabetic retinopathy    Right eye 2/11  . Essential hypertension   . Health maintenance examination    Mammogram 05/2010 Negative; Last Pap smear 03/2008; Last DM eye exam 2/11> mild non-proliferative diabetic retinopathy. OD  . Helicobacter pylori ab+ 05/2011   Pt was symptomatic and treatment planned for 05/2011  . Hx of cardiac cath 2/08   No CAD, no RAS,  normal EF  . Hypokalemia   . Hypomagnesemia   . Hypoxemia    CT angiogram 9/11>> No PE; PFTs 10/11- FEV1 1.20 (49%) with 16% better p B2, DLCO 33%> corrects to 84; O2 sats ok on 4 lpm X rapid walk X 3 laps 05/2010  . Long term current use of systemic steroids   . Morbid obesity ([place])    Target wt= 153 for BMI <30  . Obesity hypoventilation syndrome ([place])   . Pulmonary hypertension   . QT prolongation   . Sarcoidosis ([place])    Followed by Dr. Melvyn Novas; w/ liver involvement per biopsy 12/09, Reversible airway component so started on [facility] 01/2010; HFA 75% p coaching 05/2010  .  Seborrheic dermatitis of scalp   . Sleep apnea   . Tobacco abuse    Review of Systems:  Negative except as indicated in the HPI.  Physical Exam:  Vitals:   11/09/16 1332  BP: (!) 141/87  Pulse: (!) 110  Temp: 98.3 F (36.8 C)  TempSrc: Oral  SpO2: 100%  Weight: 215 lb (97.5 kg)   Physical Exam  Constitutional: She is oriented to person, place, and time. She appears well-developed and well-nourished.  HENT:  Head: Normocephalic and atraumatic.  Neck:  Trach in place  Cardiovascular: Normal rate, regular rhythm and normal heart sounds.  Exam reveals no gallop and no friction rub.   No murmur heard. Pulmonary/Chest: Effort normal  and breath sounds normal. She has no wheezes. She has no rales.  Abdominal: Soft. Bowel sounds are normal.  Musculoskeletal: She exhibits no deformity.  Mild swelling of lateral dorsum of left foot. Pain on palpation to this region.  Neurological: She is alert and oriented to person, place, and time.  Skin: Skin is warm and dry.     Assessment & Plan:   See Encounters Tab for problem based charting.  Patient seen with Dr. Dareen Piano

## 2016-11-09 NOTE — Assessment & Plan Note (Addendum)
Blood pressure remains elevated on Carvedilol only. We would like to start lisinopril for the dual indication of hypertension and diabetic nephropathy. She has been on lisinopril in the past but it was stopped by another physician, due to concerns for her kidneys.  - Continue Carvedilol 3.125mg  BID - BMP to evaluate renal function - Lisinopril 5mg  Daily, pending BMP - Repeat BMP in 4 weeks to evaluate renal response to Lisinopril - Recheck Blood pressure in 4 weeks  ADDENDUM - BMP showed Cr: 1.41; Lisinopril 5mg  Daily ordered

## 2016-11-10 ENCOUNTER — Other Ambulatory Visit: Payer: Self-pay | Admitting: Internal Medicine

## 2016-11-10 LAB — BMP8+ANION GAP
Anion Gap: 17 mmol/L (ref 10.0–18.0)
BUN/Creatinine Ratio: 13 (ref 9–23)
BUN: 18 mg/dL (ref 6–24)
CO2: 21 mmol/L (ref 20–29)
Calcium: 9.6 mg/dL (ref 8.7–10.2)
Chloride: 100 mmol/L (ref 96–106)
Creatinine, Ser: 1.41 mg/dL — ABNORMAL HIGH (ref 0.57–1.00)
GFR calc Af Amer: 51 mL/min/{1.73_m2} — ABNORMAL LOW (ref >59)
GFR calc non Af Amer: 44 mL/min/{1.73_m2} — ABNORMAL LOW (ref >59)
Glucose: 172 mg/dL — ABNORMAL HIGH (ref 65–99)
Potassium: 4.2 mmol/L (ref 3.5–5.2)
Sodium: 138 mmol/L (ref 134–144)

## 2016-11-10 LAB — LIPID PANEL
Chol/HDL Ratio: 4.7 ratio — ABNORMAL HIGH (ref 0.0–4.4)
Cholesterol, Total: 274 mg/dL — ABNORMAL HIGH (ref 100–199)
HDL: 58 mg/dL (ref >39)
Triglycerides: 586 mg/dL (ref 0–149)

## 2016-11-10 LAB — MICROALBUMIN / CREATININE URINE RATIO
Creatinine, Urine: 128.3 mg/dL
Microalb/Creat Ratio: 4585.7 mg/g creat — ABNORMAL HIGH (ref 0.0–30.0)
Microalbumin, Urine: 5883.4 ug/mL

## 2016-11-10 MED ORDER — LISINOPRIL 5 MG PO TABS: 5.0000 mg | ORAL_TABLET | Freq: Every day | ORAL | 0 refills | Status: DC

## 2016-11-10 NOTE — Progress Notes (Signed)
Internal Medicine Clinic Attending  I saw and evaluated the patient.  I personally confirmed the key portions of the history and exam documented by Dr. Melvin and I reviewed pertinent patient test results.  The assessment, diagnosis, and plan were formulated together and I agree with the documentation in the resident's note.  

## 2016-11-15 ENCOUNTER — Ambulatory Visit (HOSPITAL_COMMUNITY)
Admission: RE | Admit: 2016-11-15 | Discharge: 2016-11-15 | Disposition: A | Discharge status: Home / Self Care | Payer: Medicare Other | Source: Ambulatory Visit | Attending: Acute Care | Admitting: Acute Care

## 2016-11-15 DIAGNOSIS — Z930 Tracheostomy status: Secondary | ICD-10-CM | POA: Diagnosis not present

## 2016-11-15 DIAGNOSIS — Z4689 Encounter for fitting and adjustment of other specified devices: Secondary | ICD-10-CM | POA: Insufficient documentation

## 2016-11-15 DIAGNOSIS — G4730 Sleep apnea, unspecified: Secondary | ICD-10-CM | POA: Insufficient documentation

## 2016-11-15 DIAGNOSIS — G4733 Obstructive sleep apnea (adult) (pediatric): Secondary | ICD-10-CM | POA: Diagnosis not present

## 2016-11-15 NOTE — Progress Notes (Signed)
   Subjective:  Routine trach follow-up    Patient ID: Melody Gray, female    DOB: 1970/11/21, 46 y.o.   MRN: [id number]  HPI This is a 46 year old female w/ sig h/o sleep apnea and difficult airway. She returns today for routine trach care. No new issues noted.   Review of Systems  All other systems reviewed and are negative.  HR 108 bp 153/93 rr 22 sats 99%    Objective:   Physical Exam  Constitutional: She is oriented to person, place, and time. She appears well-developed and well-nourished. No distress.  HENT:  Head: Normocephalic and atraumatic.  Mouth/Throat: No oropharyngeal exudate.  Eyes: Conjunctivae and EOM are normal. Right eye exhibits no discharge.  Neck: Normal range of motion.  Trach stoma is clean Excellent phonation w/ PMV prior to trach change. Excellent phonation and no distress after trach changed AND red occlusive inner cannula placed.   Cardiovascular: Normal rate, regular rhythm and normal heart sounds.   Pulmonary/Chest: Effort normal and breath sounds normal. No respiratory distress.  Abdominal: Soft. Bowel sounds are normal.  Musculoskeletal: Normal range of motion.  Neurological: She is alert and oriented to person, place, and time.  Skin: She is not diaphoretic.    Trach change # 4 shiley       Assessment & Plan:  Tracheostomy dependence Difficult airway  Severe sleep apnea.  Discussion Melody Gray is doing very well. As we have discussed in the past the trach is permanent for her. We will try the red occlusive inner cannula during the day-time. This sticks out less, is lighter and generally more pleasant for younger functional and active patients. The only thing that I impressed on her and her husband is that she will need to be sure that she trades out the inner cannula to the regular inner cannula when she sleeps so that her sleep apnea is still treated.   Plan ROV 12 weeks Will continue to trial occlusive inner cannula during day but still  use regular inner cannula at night for her sleep apnea She understands that if she has trouble she can go back to regular inner cannula and PMV as she had done in the past.  She also understands that the red inner cannula can be removed for suctioning or cough should this be required.  She will call me if issues or questions arise.    35 minutes devoted to trach change and patient and family education   [name] ACNP-BC [employer] Pager # [phone] OR # [phone] if no answer

## 2016-11-15 NOTE — Progress Notes (Signed)
Tracheostomy Procedure Note  Melody Gray [id number] [birth year]  Pre Procedure Tracheostomy Information  Trach Brand: Shiley Size: 4.0 Style: Uncuffed Secured by: Velcro   Procedure: trach change    Post Procedure Tracheostomy Information  Trach Brand: Shiley Size: 4.0 Style: Uncuffed Secured by: Velcro   Post Procedure Evaluation:  ETCO2 positive color change from yellow to purple : Yes.   Vital signs:blood pressure 153/93, pulse 108, respirations 22 and pulse oximetry 99 % Patients current condition: stable Complications: No apparent complications Trach site exam: clean, dry Wound care done: 4 x 4 gauze Patient did tolerate procedure well.   Education: Changed to the occlusive inner cannula.  Instructed pt on switching to the open inner cannula when sleeping.  Patient liked the way this one felt, and demonstrated understanding on the differences.  Prescription needs: none    Additional needs: none

## 2016-11-27 ENCOUNTER — Other Ambulatory Visit: Payer: Self-pay | Admitting: *Deleted

## 2016-11-27 DIAGNOSIS — Z794 Long term (current) use of insulin: Secondary | ICD-10-CM

## 2016-11-27 DIAGNOSIS — E0800 Diabetes mellitus due to underlying condition with hyperosmolarity without nonketotic hyperglycemic-hyperosmolar coma (NKHHC): Secondary | ICD-10-CM

## 2016-11-27 MED ORDER — LIRAGLUTIDE 18 MG/3ML ~~LOC~~ SOPN: 1.2000 mg | PEN_INJECTOR | Freq: Every day | SUBCUTANEOUS | 11 refills | Status: DC

## 2016-12-07 ENCOUNTER — Encounter: Payer: Self-pay | Admitting: Internal Medicine

## 2016-12-07 ENCOUNTER — Ambulatory Visit (INDEPENDENT_AMBULATORY_CARE_PROVIDER_SITE_OTHER): Payer: Medicare Other | Admitting: Internal Medicine

## 2016-12-07 VITALS — BP 157/93 | HR 90 | Temp 98.1°F | Ht 61.0 in | Wt 216.8 lb

## 2016-12-07 DIAGNOSIS — Z720 Tobacco use: Secondary | ICD-10-CM

## 2016-12-07 DIAGNOSIS — Z8249 Family history of ischemic heart disease and other diseases of the circulatory system: Secondary | ICD-10-CM | POA: Diagnosis not present

## 2016-12-07 DIAGNOSIS — F17210 Nicotine dependence, cigarettes, uncomplicated: Secondary | ICD-10-CM

## 2016-12-07 DIAGNOSIS — Z930 Tracheostomy status: Secondary | ICD-10-CM

## 2016-12-07 DIAGNOSIS — Z79899 Other long term (current) drug therapy: Secondary | ICD-10-CM | POA: Diagnosis not present

## 2016-12-07 DIAGNOSIS — I10 Essential (primary) hypertension: Secondary | ICD-10-CM

## 2016-12-07 DIAGNOSIS — M79672 Pain in left foot: Secondary | ICD-10-CM | POA: Diagnosis not present

## 2016-12-07 MED ORDER — NICOTINE POLACRILEX 2 MG MT GUM: CHEWING_GUM | OROMUCOSAL | 0 refills | Status: DC

## 2016-12-07 MED ORDER — NICOTINE 14 MG/24HR TD PT24: 14.0000 mg | MEDICATED_PATCH | Freq: Every day | TRANSDERMAL | 0 refills | Status: DC

## 2016-12-07 NOTE — Patient Instructions (Addendum)
Ms. Vanderpol it was nice meeting you today.  -Continue taking carvedilol and lisinopril as instructed  -I have ordered labs today and will call you when I get the results.  -I want to congratulate you on making the decision to stop smoking. Start using a nicotine patch daily. Chew a piece of gum whenever there is an urge to smoke. DO NOT use more than 24 pieces of gum per day.  -I have placed your podiatry referral  -Return to the clinic in 1 month for a follow-up.

## 2016-12-07 NOTE — Assessment & Plan Note (Addendum)
Assessment Uncontrolled hypertension. Initial blood pressure 159/98 and repeat 157/93. Patient is currently on carvedilol 3.125 mg twice daily and lisinopril 5 mg daily. Lisinopril was started during her prior visit for its renoprotective effect in the setting of diabetes. Patient reports compliance with her medications. Labs done during prior visit on 11/09/2016 showing GFR 51 and creatinine 1.4. She is a current smoker (read note under smoking cessation). She is obese with a BMI of 41. Reports consuming caffeinated beverages: one cup of coffee in the morning and a whole bottle of 2 L regular soda twice a week. She does have a history of OSA which is currently being managed by pulmonology. Denies consuming alcohol or using illicit drugs.  Plan -Repeat BMP to assess renal function. If renal function is stable, increase dose of lisinopril to 10 mg daily. -Continue carvedilol -Lifestyle modifications: DASH diet, exercise/ weight loss -Started her on nicotine patches and gum for smoking cessation -Advised her to avoid drinking caffeinated beverages -Encouraged her to go to her next appointment with pulmonology (trach clinic) on October 10. -Return to the clinic in 1 month for blood pressure recheck  Addendum 12/08/2016: Creatinine 1.4 and GFR 52. Renal function stable. -Increased the dose of lisinopril to 10 mg daily -Tried calling the patient but could not reach her over the phone. Left voicemail asking her to call the clinic back.

## 2016-12-07 NOTE — Assessment & Plan Note (Signed)
Assessment Patient is a current smoker. One pack of cigarettes last 3 days, smoking since the age of 57. States she has tried Chantix in the past and it did not help. I discussed smoking cessation and patient was motivated to quit. Discussed nicotine patches + gum and she was agreeable as patches helped take of a cravings when she used them during a hospitalization in the past.  Plan -NicoDerm 14 mg patch daily for 6 weeks. Reassess at next visit. If compliant, prescribe NicoDerm 7 mg patch daily for another 2 weeks. -Nicotine 2 mg gum as needed for cravings

## 2016-12-07 NOTE — Progress Notes (Signed)
CC: Patient is here for a follow-up of hypertension.  HPI:  [name] is a 46 y.o. female with a past medical history of conditions listed below presenting to the clinic for a follow-up of hypertension. Please see problem based charting for the status of the patient's current and chronic medical conditions.   Past Medical History:  Diagnosis Date  . Abnormal LFTs (liver function tests)    Liver U/S and exam c/w HSM. Hep B serology neg. but Hep C ab +, HIV neg. 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 Acute bronchitis 07/29/2014  . Anemia   . Cardiomyopathy, nonischemic ([place])    a. Varying EF over the years - initially 35% in 09/2010. Normal cors 12/2010. b. Echo 07/2014: EF 55-60%, no RWMA, + diastolic flattening and systolic flattening c/w RV volume and pressure overload, mild LAE/RAE, mod dilated RV, mod TR, PASP severely increased at 15mmHg. c. RHC 07/2014: moderate pulmonary HTN likely WHO group 3 with markedly elevated CVP and relatively normal left sided pressures.  . Chronic diastolic CHF (congestive heart failure) ([name])   . Chronic respiratory failure ([name])    a. became O2 dependent in July 2011. b. She required trache placement in 07/2013 and has been followed by pulmonology as well.  . CKD (chronic kidney disease), stage II   . Complication of anesthesia    " difficult waking "  . Diabetes mellitus    insulin dependent  . Diabetic retinopathy    Right eye 2/11  . Essential hypertension   . Health maintenance examination    Mammogram 05/2010 Negative; Last Pap smear 03/2008; Last DM eye exam 2/11> mild non-proliferative diabetic retinopathy. OD  . Helicobacter pylori ab+ 05/2011   Pt was symptomatic and treatment planned for 05/2011  . Hx of cardiac cath 2/08   No CAD, no RAS,  normal EF  . Hypokalemia   . Hypomagnesemia   . Hypoxemia    CT angiogram 9/11>> No PE; PFTs 10/11- FEV1 1.20 (49%) with 16% better p B2, DLCO  33%> corrects to 84; O2 sats ok on 4 lpm X rapid walk X 3 laps 05/2010  . Long term current use of systemic steroids   . Morbid obesity ([name])    Target wt= 153 for BMI <30  . Obesity hypoventilation syndrome ([place])   . Pulmonary hypertension ([name])   . QT prolongation   . Sarcoidosis    Followed by Dr. Melvyn Novas; w/ liver involvement per biopsy 12/09, Reversible airway component so started on [facility] 01/2010; HFA 75% p coaching 05/2010  . Seborrheic dermatitis of scalp   . Sleep apnea   . Tobacco abuse    Review of Systems: Pertinent positives mentioned in HPI. Remainder of all ROS negative.   Physical Exam:  Vitals:   12/07/16 1402 12/07/16 1448  BP: (!) 159/98 (!) 157/93  Pulse: 98 90  Temp: 98.1 F (36.7 C)   TempSrc: Oral   SpO2: 97%   Weight: 216 lb 12.8 oz (98.3 kg)   Height: 5\' 1"  (1.549 m)    Physical Exam  Constitutional: She is oriented to person, place, and time. She appears well-developed and well-nourished. No distress.  HENT:  Head: Normocephalic and atraumatic.  Eyes: Right eye exhibits no discharge. Left eye exhibits no discharge.  Cardiovascular: Normal rate, regular rhythm and intact distal pulses.   Pulmonary/Chest: Effort normal and breath sounds normal. No respiratory distress. She has no  wheezes. She has no rales.  Trach in place  Abdominal: Soft. Bowel sounds are normal. She exhibits no distension. There is no tenderness.  Musculoskeletal: She exhibits no edema.  Neurological: She is alert and oriented to person, place, and time.  Skin: Skin is warm and dry.  Psychiatric: She has a normal mood and affect. Her behavior is normal.    Assessment & Plan:   See Encounters Tab for problem based charting.  Patient discussed with Dr. Dareen Piano

## 2016-12-07 NOTE — Assessment & Plan Note (Signed)
Problem was discussed with PCP during her prior visit. Please read note from 11/09/2016.  Patient states she was supposed to be referred to podiatry but has not heard from anyone. From speaking to staff I discovered podiatry referral was accidentally not placed.  -Referral to podiatry

## 2016-12-08 LAB — BMP8+ANION GAP
Anion Gap: 15 mmol/L (ref 10.0–18.0)
BUN/Creatinine Ratio: 14 (ref 9–23)
BUN: 19 mg/dL (ref 6–24)
CO2: 23 mmol/L (ref 20–29)
Calcium: 9.3 mg/dL (ref 8.7–10.2)
Chloride: 102 mmol/L (ref 96–106)
Creatinine, Ser: 1.4 mg/dL — ABNORMAL HIGH (ref 0.57–1.00)
GFR calc Af Amer: 52 mL/min/{1.73_m2} — ABNORMAL LOW (ref >59)
GFR calc non Af Amer: 45 mL/min/{1.73_m2} — ABNORMAL LOW (ref >59)
Glucose: 165 mg/dL — ABNORMAL HIGH (ref 65–99)
Potassium: 4.1 mmol/L (ref 3.5–5.2)
Sodium: 140 mmol/L (ref 134–144)

## 2016-12-08 MED ORDER — LISINOPRIL 10 MG PO TABS: 10.0000 mg | ORAL_TABLET | Freq: Every day | ORAL | 1 refills | Status: DC

## 2016-12-08 NOTE — Progress Notes (Signed)
Internal Medicine Clinic Attending  Case discussed with Dr. Rathoreat the time of the visit. We reviewed the resident's history and exam and pertinent patient test results. I agree with the assessment, diagnosis, and plan of care documented in the resident's note.  

## 2016-12-08 NOTE — Addendum Note (Signed)
Addended by: Shela Leff on: 12/08/2016 11:47 AM   Modules accepted: Orders

## 2017-01-03 ENCOUNTER — Ambulatory Visit (INDEPENDENT_AMBULATORY_CARE_PROVIDER_SITE_OTHER): Payer: Medicare Other | Admitting: Podiatry

## 2017-01-03 ENCOUNTER — Encounter: Payer: Self-pay | Admitting: Podiatry

## 2017-01-03 ENCOUNTER — Ambulatory Visit: Payer: Medicare Other

## 2017-01-03 ENCOUNTER — Ambulatory Visit (INDEPENDENT_AMBULATORY_CARE_PROVIDER_SITE_OTHER): Payer: Medicare Other

## 2017-01-03 VITALS — Resp 16

## 2017-01-03 DIAGNOSIS — M722 Plantar fascial fibromatosis: Secondary | ICD-10-CM

## 2017-01-03 DIAGNOSIS — Q6650 Congenital pes planus, unspecified foot: Secondary | ICD-10-CM

## 2017-01-03 DIAGNOSIS — M779 Enthesopathy, unspecified: Secondary | ICD-10-CM

## 2017-01-03 MED ORDER — TRIAMCINOLONE ACETONIDE 10 MG/ML IJ SUSP
10.0000 mg | Freq: Once | INTRAMUSCULAR | Status: AC
  Administered 2017-01-03: 10 mg

## 2017-01-03 NOTE — Progress Notes (Signed)
Subjective:    Patient ID: Melody Gray, female   DOB: 46 y.o.   MRN: [id number]   HPI patient presents stating he has pain in both his feet but the left one is worse and it hurts when he tries to walk    Review of Systems  All other systems reviewed and are negative.       Objective:  Physical Exam  Constitutional: She appears well-developed and well-nourished.  Cardiovascular: Intact distal pulses.   Pulmonary/Chest: Effort normal.  Musculoskeletal: Normal range of motion.  Neurological: She is alert.  Skin: Skin is warm.  Nursing note and vitals reviewed.  neurovascular status was found to be intact with muscle strength diminished and range of motion subtalar midtarsal joint diminished. Patient has moderate flatfoot deformity bilateral and was found to have quite a bit of discomfort in the outside of the left foot around the fifth metatarsal base with mild discomfort on the right. Patient is not in good health and has a history of severe sleep apnea and does have a trach     Assessment:   Poor health individual with inflammation tendinitis left over right foot base of fifth metatarsal     Plan:   H&P and x-rays reviewed. Today I went ahead did a careful lateral injection of the tendon complex 3 mg Kenalog 5 mg Xylocaine advised on anti-inflammatories ice therapy as needed and support therapy. Reappoint to recheck  X-rays indicate that there is moderate depression of the arch bilateral with no indication of stress fracture and mild arthritis

## 2017-01-03 NOTE — Progress Notes (Signed)
   Subjective:    Patient ID: Melody Gray, female    DOB: 02/27/1971, 47 y.o.   MRN: [id number]  HPI Chief Complaint  Patient presents with  . Foot Pain    Left foot; lateral side (near arch area); pt stated, "Feels like have a knot in foot; went to another podiatry office last year and they did not do xrays and told me it was my arch"; x2 yrs; pt Diabetic Type 2; Sugar=did not check today; A1C=8.0      Review of Systems  All other systems reviewed and are negative.      Objective:   Physical Exam        Assessment & Plan:

## 2017-01-04 ENCOUNTER — Encounter: Payer: Medicare Other | Admitting: Internal Medicine

## 2017-01-17 ENCOUNTER — Other Ambulatory Visit: Payer: Self-pay | Admitting: *Deleted

## 2017-01-17 MED ORDER — CARVEDILOL 3.125 MG PO TABS: 3.1250 mg | ORAL_TABLET | Freq: Two times a day (BID) | ORAL | 0 refills | Status: DC

## 2017-02-06 ENCOUNTER — Other Ambulatory Visit: Payer: Self-pay | Admitting: Internal Medicine

## 2017-02-07 ENCOUNTER — Ambulatory Visit (HOSPITAL_COMMUNITY): Payer: Medicare Other

## 2017-02-14 ENCOUNTER — Ambulatory Visit (HOSPITAL_COMMUNITY)
Admission: RE | Admit: 2017-02-14 | Discharge: 2017-02-14 | Disposition: A | Discharge status: Home / Self Care | Payer: Medicare Other | Source: Ambulatory Visit | Attending: Acute Care | Admitting: Acute Care

## 2017-02-14 DIAGNOSIS — G4730 Sleep apnea, unspecified: Secondary | ICD-10-CM | POA: Insufficient documentation

## 2017-02-14 DIAGNOSIS — Z4689 Encounter for fitting and adjustment of other specified devices: Secondary | ICD-10-CM | POA: Diagnosis not present

## 2017-02-14 DIAGNOSIS — Z930 Tracheostomy status: Secondary | ICD-10-CM

## 2017-02-14 DIAGNOSIS — Z430 Encounter for attention to tracheostomy: Secondary | ICD-10-CM | POA: Diagnosis not present

## 2017-02-14 NOTE — Progress Notes (Signed)
Tracheostomy Procedure Note  Melody Gray [id number] March 23, 1971  Pre Procedure Tracheostomy Information  Trach Brand: Shiley Size: 4.0 Style: Uncuffed Secured by: Velcro   Procedure: trach change    Post Procedure Tracheostomy Information  Trach Brand: Shiley Size: 4.0 Style: Uncuffed Secured by: Velcro   Post Procedure Evaluation:  ETCO2 positive color change from yellow to purple : Yes.   Vital signs:blood pressure 153/111, pulse 110, respirations 18 and pulse oximetry 100 % Patients current condition: stable Complications: No apparent complications Trach site exam: clean Wound care done: 4 x 4 gauze Patient did tolerate procedure well.   Education: none  Prescription needs: none    Additional needs: none

## 2017-02-14 NOTE — Progress Notes (Signed)
    Subject Here for routine trach change   HPI This is a 46 year old female w/ severe sleep apnea and known difficult airway. She returns today for routine trach care. Has no new issues.   Objective Heart rate 110 respirations 18 blood pressure 153/111  Physical Exam General-46 year old AAf. No acute distress.  HENT- NCAT. The #4 trach is unremarkable and the stoma is clean and dry Pulm-clear and w/out accessory use Card- RRR no MRG Abd: soft + bowel sounds Ext- no edema brisk CR  Neuro- awake and alert    Impression/plan Tracheostomy dependence Difficult airway Severe sleep apnea  Plan ROV 3 months Cont routine trach care   'my time 23 min  [name] ACNP-BC [place] Pager # [phone] OR # [phone] if no answer

## 2017-02-15 ENCOUNTER — Encounter: Payer: Self-pay | Admitting: Internal Medicine

## 2017-02-15 ENCOUNTER — Encounter: Payer: Medicare Other | Admitting: Internal Medicine

## 2017-02-15 NOTE — Assessment & Plan Note (Deleted)
Patient is a current smoker. ---- ? Quit smoking ----- Cut back to ---- One pack of cigarettes last 3 days, smoking since the age of 31. Patient expressed desire to quit at last visit (12/07/16) and was started on nicotine patches + gum. Patient has ---- been compliant with her patches and gum ---. ----  NicoDerm 14 mg patch daily for 6 weeks. ----- Reassess at next visit. If compliant, prescribe NicoDerm 7 mg patch daily for another 2 weeks. ---- - Nicotine 2 mg gum as needed for cravings

## 2017-02-15 NOTE — Assessment & Plan Note (Deleted)
Patient is currently on carvedilol 3.125 mg twice daily and lisinopril ---- increased to 10mg  last visit ----- 5 mg daily. ---- Patient reports compliance with her medications. Labs done during prior visit on 12/08/2016 show stable GFR 52 and creatinine 1.4.BP today ------ Plan - Continue lisinopril to 10 mg Daily. - Continue carvedilol 3.125 Daily - Continue Lifestyle modifications: diet, exercise, weight loss

## 2017-02-15 NOTE — Progress Notes (Deleted)
   CC: Hypertension  HPI:  [name] is a 46 y.o. M/F with PMHx listed below presenting for ***. Please see the A&P for the status of the patient's chronic medical problems.   Past Medical History:  Diagnosis Date  . Abnormal LFTs (liver function tests)    Liver U/S and exam c/w HSM. Hep B serology neg. but Hep C ab +, HIV neg. 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 Acute bronchitis 07/29/2014  . Anemia   . Cardiomyopathy, nonischemic ([place])    a. Varying EF over the years - initially 35% in 09/2010. Normal cors 12/2010. b. Echo 07/2014: EF 55-60%, no RWMA, + diastolic flattening and systolic flattening c/w RV volume and pressure overload, mild LAE/RAE, mod dilated RV, mod TR, PASP severely increased at 57mmHg. c. RHC 07/2014: moderate pulmonary HTN likely WHO group 3 with markedly elevated CVP and relatively normal left sided pressures.  . Chronic diastolic CHF (congestive heart failure) ([place])   . Chronic respiratory failure ([place])    a. became O2 dependent in July 2011. b. She required trache placement in 07/2013 and has been followed by pulmonology as well.  . CKD (chronic kidney disease), stage II   . Complication of anesthesia    " difficult waking "  . Diabetes mellitus    insulin dependent  . Diabetic retinopathy    Right eye 2/11  . Essential hypertension   . Health maintenance examination    Mammogram 05/2010 Negative; Last Pap smear 03/2008; Last DM eye exam 2/11> mild non-proliferative diabetic retinopathy. OD  . Helicobacter pylori ab+ 05/2011   Pt was symptomatic and treatment planned for 05/2011  . Hx of cardiac cath 2/08   No CAD, no RAS,  normal EF  . Hypokalemia   . Hypomagnesemia   . Hypoxemia    CT angiogram 9/11>> No PE; PFTs 10/11- FEV1 1.20 (49%) with 16% better p B2, DLCO 33%> corrects to 84; O2 sats ok on 4 lpm X rapid walk X 3 laps 05/2010  . Long term current use of systemic steroids   . Morbid obesity  ([place])    Target wt= 153 for BMI <30  . Obesity hypoventilation syndrome ([place])   . Pulmonary hypertension ([place])   . QT prolongation   . Sarcoidosis    Followed by Dr. Melvyn Novas; w/ liver involvement per biopsy 12/09, Reversible airway component so started on [facility] 01/2010; HFA 75% p coaching 05/2010  . Seborrheic dermatitis of scalp   . Sleep apnea   . Tobacco abuse    Review of Systems:  Performed and negative except as otherwise indicated.   Physical Exam:  There were no vitals filed for this visit. Physical Exam   Assessment & Plan:   See Encounters Tab for problem based charting.  Patient {GC/GE:[id number]::"discussed with","seen with"} Dr. {NAMES:[id number]::"Butcher","Granfortuna","E. Hoffman","Klima","Mullen","Narendra","Raines","Vincent"}

## 2017-04-05 ENCOUNTER — Telehealth: Payer: Self-pay | Admitting: Internal Medicine

## 2017-04-05 ENCOUNTER — Ambulatory Visit (INDEPENDENT_AMBULATORY_CARE_PROVIDER_SITE_OTHER): Payer: Medicare Other | Admitting: Internal Medicine

## 2017-04-05 ENCOUNTER — Encounter: Payer: Self-pay | Admitting: Internal Medicine

## 2017-04-05 VITALS — BP 182/110 | HR 113 | Ht 61.0 in | Wt 214.8 lb

## 2017-04-05 DIAGNOSIS — J9611 Chronic respiratory failure with hypoxia: Secondary | ICD-10-CM

## 2017-04-05 DIAGNOSIS — D869 Sarcoidosis, unspecified: Secondary | ICD-10-CM | POA: Diagnosis not present

## 2017-04-05 DIAGNOSIS — I10 Essential (primary) hypertension: Secondary | ICD-10-CM

## 2017-04-05 DIAGNOSIS — F17210 Nicotine dependence, cigarettes, uncomplicated: Secondary | ICD-10-CM | POA: Diagnosis not present

## 2017-04-05 NOTE — Telephone Encounter (Signed)
She quailified today for amb 02 and we are in the process of ordering what she needs  Ok to do best fit portable eval

## 2017-04-05 NOTE — Patient Instructions (Addendum)
I will contact advance to see what your needs are but for now you should use 2lpm at bedtime and with activity   Keep up your follow up at the [facility] with [place] wear your 02 with exercise to help you "burn fat" off   Please schedule a follow up visit in 12 months but call sooner if needed  Add: rec take meds as she was directed by pcp and f/u asap

## 2017-04-05 NOTE — Progress Notes (Signed)
Subjective:     Patient ID: Melody Gray, female   DOB: 06/27/1970   MRN: [id number]   Brief patient profile:  6  yobf  Active smoker  dx of sarcoidosis in 2001 = sob, cough became 02 dep in July 7371 complicated by [facility] and morbid obesity with OHS requiring trach 07/2013     History of Present Illness   January 06, 2010 ov  c/o increased SOB. Pt states outpt clinic advised her to follow up with Dr. Melvyn Novas for Sacoidosis. doe on 02 at 2 lpm sitting then 4 with activity gets off at the curb struggles with grocery store. Try taking Prednisone one half even days in am with breakfast  Be sure you take omeprazole Take one 30-60 min before first and last meals of the day  Stop nifedipine  Start Cardizem 240 mg once daily  Wear 24 hours per day, 2 lpm at rest and sleeping, 4lpm with activity, this is the best way to help your heart function better  CT Chest ( repeated with contrast)> no PE, just sarcoid changes       09/01/2014 f/u ov/Kelvis Berger re: sarcoid without apparent flare on 10 a/w 5 mg daily  Chief Complaint  Patient presents with  . Follow-up    Pt states that her breathing is doing well today. She denies any new co's. She rarely uses rescue inhaler.   02 4lpm 24/7 uses trach collar at hs  pred 10 mg a/w 5 mg daily  Flares of sob occur with fluid retention but all adjustments per cards to diuretic rx  rec Try prednisone 5 mg daily to see if you don't do just as well with your breathing and if so leave it at 5 mg daily    01/07/2015 f/u ov/Alyx Gee re: sarcoid ? Still steroid dep/ morbid obesity/ chronic trach  Chief Complaint  Patient presents with  . Follow-up    Pt states that she is overall doing well and denies any co's today.   2lpm portable unless walking then 3-4 >  Has to sit and rest at wm but not HT  Collar is set on 28% rec Prednisone 5 mg one on even and one half on odd x 2 weeks then drop the odd day off completely so you just take prednisone 5 mg on even days and none on odd.    If worse cough/nausea then resume previous dose. Increase the 02 to 4lpm with exercise otherwise 2lpm 24/7 is fine    04/05/2017  f/u ov/Gwendlyn Hanback re:  Sarcoid no longer pred dep as of 07/2015 / 02 dep hs and walking  Chief Complaint  Patient presents with  . Follow-up    Here to recert for o2. She states she feels like she is developing a cold. She is having some cough and hoarseness for the past 2 days.   alb once a month at most Uses 2lpm hs with trach plug out via NP   POC but says  too heavy to use with activity  Overall improved activity tol but still = MMRC3 = can't walk 100 yards even at a slow pace at a flat grade s stopping due to sob  But not clear she's always doing this with 02 as her POC is too heavy   No obvious day to day or daytime variability or assoc excess/ purulent sputum or mucus plugs or hemoptysis or cp or chest tightness, subjective wheeze or overt sinus or hb symptoms. No unusual exposure hx or  h/o childhood pna/ asthma or knowledge of premature birth.  Sleeping ok flat without nocturnal  or early am exacerbation  of respiratory  c/o's or need for noct saba. Also denies any obvious fluctuation of symptoms with weather or environmental changes or other aggravating or alleviating factors except as outlined above   Current Allergies, Complete Past Medical History, Past Surgical History, Family History, and Social History were reviewed in [employer] record.  ROS  The following are not active complaints unless bolded Hoarseness, sore throat, dysphagia, dental problems, itching, sneezing,  nasal congestion or discharge of excess mucus or purulent secretions, ear ache,   fever, chills, sweats, unintended wt loss or wt gain, classically pleuritic or exertional cp,  orthopnea pnd or leg swelling, presyncope, palpitations, abdominal pain, anorexia, nausea, vomiting, diarrhea  or change in bowel habits or change in bladder habits, change in stools or change in  urine, dysuria, hematuria,  rash, arthralgias, visual complaints, headache, numbness, weakness or ataxia or problems with walking or coordination,  change in mood/affect or memory.        Current Meds  Medication Sig  . albuterol (PROVENTIL HFA;VENTOLIN HFA) 108 (90 BASE) MCG/ACT inhaler Inhale 2 puffs into the lungs every 6 (six) hours as needed for wheezing or shortness of breath.  . carvedilol (COREG) 3.125 MG tablet Take 1 tablet (3.125 mg total) by mouth 2 (two) times daily.  . diclofenac sodium (VOLTAREN) 1 % GEL Apply 4 g topically 4 (four) times daily.  . fluticasone (FLONASE) 50 MCG/ACT nasal spray Place 2 sprays into both nostrils daily.  Marland Kitchen glucose blood test strip Use to check blood sugar 3 times daily. Dx code E11.65. Insulin dependent  . insulin detemir (LEVEMIR) 100 UNIT/ML injection Inject 0.46 mLs (46 Units total) into the skin at bedtime.  . Insulin Pen Needle (B-D UF III MINI PEN NEEDLES) 31G X 5 MM MISC Use to inject insulin twice daily. diag code E11.9. Insulin dependent  . Insulin Syringe-Needle U-100 (B-D INS SYRINGE 0.5CC/30GX1/2") 30G X 1/2" 0.5 ML MISC USE AS DIRECTED  . liraglutide (VICTOZA) 18 MG/3ML SOPN Inject 0.2 mLs (1.2 mg total) into the skin daily.  . metFORMIN (GLUCOPHAGE) 1000 MG tablet take 1 tablet by mouth twice a day with food               Past Medical History:  Sarcoidosis (Dr Herald Vallin)-w/ liver involvement per biopsy 03/2008 - Reversible airway component so start [facility] 01/2010 > better  - HFa 75% p coaching May 04, 2010  -  Off chronic prednisone 05/2011> restarted 07/2013 > off 08/19/15  Unexplained Hypoxemia July 2011  - CT angiogram 01/07/10 >>> no PE  - PFT's February 09, 2010 FEV1 1.20 (49%) with ratio 72 and FRC 90%, FEV1  16% better p B2, DLC0 33% > corrects to 84   Morbid obesity  - Target wt = 153 for BMI < 30  - Trach 08/10/13 with very difficult airway([place]) QT prolongation  Diabetes mellitus, type II  Hypertension  Heart  catheterization 06-06-06 : No CAD, no RAS, normal EF  Hx eclampsia  Hx of scalp seborrheic dermatitis            Objective:   Physical Exam  Obese amb bf nad/ pmv in place   wt 238 January 06, 2010 >   217 May 04, 2010 >  02/12/2012  213 >  215 04/04/2012  > 239 04/08/2013 > 07/24/2013 249 > 07/31/2013  252 >   10/08/2013  233> 11/19/2013  242 >245 12/02/2013 >241 01/29/2014 > 02/16/2014  250 > 09/01/2014   244 > 01/07/2015   229  > 04/05/2017    214    Vital signs reviewed - Note on arrival 02 sats  98% on RA     HEENT: nl dentition, turbinates bilaterally, and oropharynx. Nl external ear canals without cough reflex   NECK :  without JVD/Nodes/TM/ nl carotid upstrokes bilaterally/ PMV in place / trach site looks good    LUNGS: no acc muscle use,  Nl contour chest - very distant bs/ no wheeze   CV:  RRR  no s3 or murmur or increase in P2, and no edema   ABD:  soft and nontender with nl inspiratory excursion in the supine position. No bruits or organomegaly appreciated, bowel sounds nl  MS:  Nl gait/ ext warm without deformities, calf tenderness, cyanosis or clubbing No obvious joint restrictions   SKIN: warm and dry without lesions    NEURO:  alert, approp, nl sensorium with  no motor or cerebellar deficits apparent.                   Assessment:

## 2017-04-05 NOTE — Telephone Encounter (Signed)
Request.  Once we receive requalifing order for 02 place order best fit portable eval to accomidaite ergertional needs.

## 2017-04-05 NOTE — Telephone Encounter (Signed)
Spoke with Corene Cornea, he is trying to re-qualify pt for O2 and needs a new order starter kit so Medicare can pay for her oxygen for the next 5 years. She also needs a best fit eval for O2, and he states with the order for exertional exercise MW recommended, she may need to go on the regular tanks again.  He also needs an order for ONO on 2L placed. MW please advise. Orders pended.

## 2017-04-05 NOTE — Telephone Encounter (Signed)
[place] noted. Melody Gray should receive the orders.

## 2017-04-06 ENCOUNTER — Encounter: Payer: Self-pay | Admitting: Internal Medicine

## 2017-04-06 ENCOUNTER — Telehealth: Payer: Self-pay | Admitting: *Deleted

## 2017-04-06 DIAGNOSIS — J9611 Chronic respiratory failure with hypoxia: Secondary | ICD-10-CM | POA: Insufficient documentation

## 2017-04-06 NOTE — Assessment & Plan Note (Signed)
>   3 min  Advised re risks of smoking given her chronic resp failure and need for 02 with direct  Dangers of exposure

## 2017-04-06 NOTE — Assessment & Plan Note (Signed)
Non- adherent, poorly controlled  rec she takes meds when gets home and f/u with PCP asap

## 2017-04-06 NOTE — Telephone Encounter (Signed)
-----   Message from Tanda Rockers, MD sent at 04/06/2017  6:48 AM EST ----- I forgot to discuss her hbp with her - needs to take meds exactly as rec by PCP and monitor carefully - if unable to do so today, since the weather will be turning bad, needs to return to see NP with all meds in hand to regroup

## 2017-04-06 NOTE — Assessment & Plan Note (Signed)
  Body mass index is 40.59 kg/m.  -  trending  Down/ encouraged Lab Results  Component Value Date   TSH 2.500 08/16/2013     Contributing to gerd risk/ doe/reviewed the need and the process to achieve and maintain neg calorie balance > defer f/u primary care including intermittently monitoring thyroid status

## 2017-04-06 NOTE — Assessment & Plan Note (Signed)
w/ liver involvement per biopsy 03/2008   - PFT's 11/21/2011 FEV1 1.23 (50%) ratio 67 and no better p B2,  DLCO 31 corrects to 73   - trial off chronic prednisone 02/12/2012 > resumed 07/2013  -  rx reduce pred to 5 mg daily 09/01/2014 >  start taper 5 mg qod as of 01/07/2015 > off 07/2015   No evidence of any active pulmonary or systemic sarcoid though she does remain 02 dep at hs and with activity as a result of prior dz/ morbid obesity partly due to steroids and has [place] which may be due to combination of both.  No need for further steroids here.

## 2017-04-06 NOTE — Assessment & Plan Note (Addendum)
-    Complicated by PH - Walked 3 laps @ 185 ft each stopped due to  End of test, no desat on 4lpm 10/05/2010  - 02/02/2011  Walked RA  2 laps @ 185 ft each stopped due to  desat to 88 so change rx to 2lpm with activity outside the house and at hs - 08/21/2011  Walked RA x 1 laps @ 185 ft each stopped due to desat 85% - 11/21/2011  Walked RA  2 laps @ 185 ft each stopped due to  Sat 88 -  ONO RA < 89% x 6h 26 min 03/19/12 > repeat on 2lpm 04/05/12 > desats resolved - 04/08/2013   Walked RA x one lap @ 185 stopped due to  sats 85%  - HCO3 = 40 07/31/13 c/w hypercarbia - 08/10/13 Trach emergently by Janace Hoard with very difficult airway  - 01/07/2015 02 sat = 88% RA - 01/07/2015 2lpm at rest was 98%  - 01/07/2015  Walked RA x 3 laps @ 185 ft each stopped due to  End of study, nl pace, no sob or desat    - HC03   12/07/16  = 23  - 04/05/2017   Walked RA x one lap @ 185 stopped due to  desats to 85% improved to 97% on 2lpm vs 98% RA at rest so needs 2lpm sleeping and with activity > best fit requested  She is using 02 and benefiting from noct use but needs better equipment so she can ambulate more than room to room s desaturating, requested

## 2017-04-06 NOTE — Telephone Encounter (Signed)
Spoke with the pt and notified of recs per MW  She states she had not taken her meds yet when I checked her BP at appt yesterday  She will cont  To f/u with PCP for this

## 2017-04-18 ENCOUNTER — Encounter: Payer: Medicare Other | Admitting: Internal Medicine

## 2017-04-25 ENCOUNTER — Other Ambulatory Visit: Payer: Self-pay | Admitting: Internal Medicine

## 2017-04-26 NOTE — Telephone Encounter (Signed)
Pt has an appt 05/07/17 in [facility].

## 2017-05-02 ENCOUNTER — Encounter: Payer: Self-pay | Admitting: Internal Medicine

## 2017-05-02 DIAGNOSIS — J449 Chronic obstructive pulmonary disease, unspecified: Secondary | ICD-10-CM | POA: Diagnosis not present

## 2017-05-02 DIAGNOSIS — R0902 Hypoxemia: Secondary | ICD-10-CM | POA: Diagnosis not present

## 2017-05-07 ENCOUNTER — Telehealth: Payer: Self-pay | Admitting: Internal Medicine

## 2017-05-07 ENCOUNTER — Encounter: Payer: Self-pay | Admitting: Internal Medicine

## 2017-05-07 ENCOUNTER — Ambulatory Visit (HOSPITAL_COMMUNITY)
Admission: RE | Admit: 2017-05-07 | Discharge: 2017-05-07 | Disposition: A | Discharge status: Home / Self Care | Payer: Medicare Other | Source: Ambulatory Visit | Attending: Internal Medicine | Admitting: Internal Medicine

## 2017-05-07 ENCOUNTER — Ambulatory Visit (INDEPENDENT_AMBULATORY_CARE_PROVIDER_SITE_OTHER): Payer: Medicare Other | Admitting: Internal Medicine

## 2017-05-07 VITALS — BP 164/96 | HR 103 | Temp 98.3°F | Wt 221.0 lb

## 2017-05-07 DIAGNOSIS — K745 Biliary cirrhosis, unspecified: Secondary | ICD-10-CM

## 2017-05-07 DIAGNOSIS — Z8619 Personal history of other infectious and parasitic diseases: Secondary | ICD-10-CM

## 2017-05-07 DIAGNOSIS — E0800 Diabetes mellitus due to underlying condition with hyperosmolarity without nonketotic hyperglycemic-hyperosmolar coma (NKHHC): Secondary | ICD-10-CM

## 2017-05-07 DIAGNOSIS — Z930 Tracheostomy status: Secondary | ICD-10-CM | POA: Diagnosis not present

## 2017-05-07 DIAGNOSIS — J9611 Chronic respiratory failure with hypoxia: Secondary | ICD-10-CM

## 2017-05-07 DIAGNOSIS — E1122 Type 2 diabetes mellitus with diabetic chronic kidney disease: Secondary | ICD-10-CM

## 2017-05-07 DIAGNOSIS — J3489 Other specified disorders of nose and nasal sinuses: Secondary | ICD-10-CM

## 2017-05-07 DIAGNOSIS — Z794 Long term (current) use of insulin: Secondary | ICD-10-CM

## 2017-05-07 DIAGNOSIS — K824 Cholesterolosis of gallbladder: Secondary | ICD-10-CM

## 2017-05-07 DIAGNOSIS — I5030 Unspecified diastolic (congestive) heart failure: Secondary | ICD-10-CM | POA: Diagnosis not present

## 2017-05-07 DIAGNOSIS — R058 Other specified cough: Secondary | ICD-10-CM

## 2017-05-07 DIAGNOSIS — E11319 Type 2 diabetes mellitus with unspecified diabetic retinopathy without macular edema: Secondary | ICD-10-CM

## 2017-05-07 DIAGNOSIS — R05 Cough: Secondary | ICD-10-CM

## 2017-05-07 DIAGNOSIS — N189 Chronic kidney disease, unspecified: Secondary | ICD-10-CM | POA: Diagnosis not present

## 2017-05-07 DIAGNOSIS — Z9981 Dependence on supplemental oxygen: Secondary | ICD-10-CM

## 2017-05-07 DIAGNOSIS — G4733 Obstructive sleep apnea (adult) (pediatric): Secondary | ICD-10-CM | POA: Diagnosis not present

## 2017-05-07 DIAGNOSIS — K7460 Unspecified cirrhosis of liver: Secondary | ICD-10-CM | POA: Diagnosis not present

## 2017-05-07 DIAGNOSIS — I130 Hypertensive heart and chronic kidney disease with heart failure and stage 1 through stage 4 chronic kidney disease, or unspecified chronic kidney disease: Secondary | ICD-10-CM | POA: Diagnosis not present

## 2017-05-07 DIAGNOSIS — D8689 Sarcoidosis of other sites: Secondary | ICD-10-CM | POA: Diagnosis not present

## 2017-05-07 DIAGNOSIS — Z79899 Other long term (current) drug therapy: Secondary | ICD-10-CM | POA: Diagnosis not present

## 2017-05-07 DIAGNOSIS — E113299 Type 2 diabetes mellitus with mild nonproliferative diabetic retinopathy without macular edema, unspecified eye: Secondary | ICD-10-CM

## 2017-05-07 DIAGNOSIS — F17210 Nicotine dependence, cigarettes, uncomplicated: Secondary | ICD-10-CM

## 2017-05-07 DIAGNOSIS — R0602 Shortness of breath: Secondary | ICD-10-CM | POA: Diagnosis not present

## 2017-05-07 DIAGNOSIS — K219 Gastro-esophageal reflux disease without esophagitis: Secondary | ICD-10-CM | POA: Diagnosis not present

## 2017-05-07 DIAGNOSIS — I10 Essential (primary) hypertension: Secondary | ICD-10-CM

## 2017-05-07 LAB — POCT GLYCOSYLATED HEMOGLOBIN (HGB A1C): Hemoglobin A1C: 8.1

## 2017-05-07 LAB — GLUCOSE, CAPILLARY: Glucose-Capillary: 273 mg/dL — ABNORMAL HIGH (ref 65–99)

## 2017-05-07 MED ORDER — LISINOPRIL 10 MG PO TABS: 10.0000 mg | ORAL_TABLET | Freq: Every day | ORAL | 11 refills | Status: DC

## 2017-05-07 MED ORDER — RANITIDINE HCL 150 MG PO TABS: 150.0000 mg | ORAL_TABLET | Freq: Two times a day (BID) | ORAL | 1 refills | Status: DC

## 2017-05-07 MED ORDER — LIRAGLUTIDE 18 MG/3ML ~~LOC~~ SOPN: 1.8000 mg | PEN_INJECTOR | Freq: Every day | SUBCUTANEOUS | 11 refills | Status: DC

## 2017-05-07 MED ORDER — AZITHROMYCIN 250 MG PO TABS: ORAL_TABLET | ORAL | 0 refills | Status: AC

## 2017-05-07 NOTE — Telephone Encounter (Signed)
ONO on 2lpm done by [facility] on 05/02/17 reviewed  Per MW needs to increase to 3lpm o2 with sleep and have the test repeated on 3lpm  LMTCB

## 2017-05-07 NOTE — Assessment & Plan Note (Addendum)
Patient endorses compliance with carvedilol 3.125mg  BID; she has not been taking lisinopril due to lack of refills and not being able to see her PCP. She denies cardiac chest pain, shortness of breath, edema.  Plan: --continue carvedilol 3.125mg  BID --restart lisinopril at 10mg  daily (patient had not increased to 10mg  dose previously) --f/u in 2wks

## 2017-05-07 NOTE — Assessment & Plan Note (Signed)
Patient with gallbladder polyps seen on Abd U/S 2017, biggest at 1.97mm.   Would repeat Abd U/S at f/u with PCP. If unchanged and asymptomatic, no need for further monitoring.

## 2017-05-07 NOTE — Assessment & Plan Note (Signed)
Patient endorses compliance with metformin 1000mg  BID, victoza 1.2mg  daily and levemir 46 units daily. She does not check her CBGs. She endorses polyuria. She endorses feeling hypoglycemic when she misses meals.  Plan: --continue metformin 1000mg  BID --continue Levemir 46 units daily --increase victoza to 1.8mg  daily --f/u in 3 months w/ PCP for A1c, can consider adding on empagliflozin in future if her renal function remains stable.

## 2017-05-07 NOTE — Assessment & Plan Note (Signed)
Patient endorses 1.5wks of cough productive of yellow sputum, nasal congestion with rhinorrhea. She endorses a subjective fever at the beginning of her symptoms but not since then. She denies sick contacts, myalgias, nausea, vomiting, diarrhea, constipation, abd pain, dysuria, hematuria. She denies orthopnea but does state her cough gets worse when she tries to lie down flat. She denies LE edema.   Exam reveals mild, diffuse wheezing and no rales or focal areas of decreased breathsounds to indicate lobar pneumonia.   In this patient with risk factors, will go ahead and treat for atypical pneumonia with azithromycin. Will get CXR to evaluate for lobar pneumonia.  Plan: --Z pack --CXR 2 view

## 2017-05-07 NOTE — Progress Notes (Signed)
CC: cough  HPI:  Melody Gray is a 47 y.o. with a PMH of OSA - trach dependent, chronic hypoxic respiratory failure on 3LNC, T2DM with CKD/retinopathy, HTN, dCHF, sarcoidosis, and GERD presenting to clinic for cough, abdominal fullness, HTN, and T2DM.  Patient endorses 1.5wks of cough productive of yellow sputum, nasal congestion with rhinorrhea. She endorses a subjective fever at the beginning of her symptoms but not since then. She denies sick contacts, myalgias, nausea, vomiting, diarrhea, constipation, abd pain, dysuria, hematuria. She denies orthopnea but does state her cough gets worse when she tries to lie down flat. She denies LE edema.   Patient also endorses abdominal bloating and tightness for same duration; as above, denies GI symptoms other than acid reflux. Furthermore, she denies melena or hematochezia.  HTN: Patient endorses compliance with carvedilol 3.125mg  BID; she has not been taking lisinopril due to lack of refills and not being able to see her PCP. She denies cardiac chest pain, shortness of breath, edema.  T2DM: Patient endorses compliance with metformin 1000mg  BID, victoza 1.2mg  daily and levemir 46 units daily. She does not check her CBGs. She endorses polyuria. She endorses feeling hypoglycemic when she misses meals.    Please see problem based Assessment and Plan for status of patients chronic conditions.  Past Medical History:  Diagnosis Date  . Abnormal LFTs (liver function tests)    Liver U/S and exam c/w HSM. Hep B serology neg. but Hep C ab +, HIV neg. 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 Acute bronchitis 07/29/2014  . Anemia   . Cardiomyopathy, nonischemic ([place])    a. Varying EF over the years - initially 35% in 09/2010. Normal cors 12/2010. b. Echo 07/2014: EF 55-60%, no RWMA, + diastolic flattening and systolic flattening c/w RV volume and pressure overload, mild LAE/RAE, mod dilated RV, mod TR,  PASP severely increased at 65mmHg. c. RHC 07/2014: moderate pulmonary HTN likely WHO group 3 with markedly elevated CVP and relatively normal left sided pressures.  . Chronic diastolic CHF (congestive heart failure) ([place])   . Chronic respiratory failure ([place])    a. became O2 dependent in July 2011. b. She required trache placement in 07/2013 and has been followed by pulmonology as well.  . CKD (chronic kidney disease), stage II   . Complication of anesthesia    " difficult waking "  . Diabetes mellitus    insulin dependent  . Diabetic retinopathy    Right eye 2/11  . Essential hypertension   . Health maintenance examination    Mammogram 05/2010 Negative; Last Pap smear 03/2008; Last DM eye exam 2/11> mild non-proliferative diabetic retinopathy. OD  . Helicobacter pylori ab+ 05/2011   Pt was symptomatic and treatment planned for 05/2011  . Hx of cardiac cath 2/08   No CAD, no RAS,  normal EF  . Hypokalemia   . Hypomagnesemia   . Hypoxemia    CT angiogram 9/11>> No PE; PFTs 10/11- FEV1 1.20 (49%) with 16% better p B2, DLCO 33%> corrects to 84; O2 sats ok on 4 lpm X rapid walk X 3 laps 05/2010  . Long term current use of systemic steroids   . Morbid obesity ([place])    Target wt= 153 for BMI <30  . Obesity hypoventilation syndrome ([place])   . Pulmonary hypertension ([place])   . QT prolongation   . Sarcoidosis    Followed by Dr. Melvyn Novas; w/ liver involvement  per biopsy 12/09, Reversible airway component so started on [facility] 01/2010; HFA 75% p coaching 05/2010  . Seborrheic dermatitis of scalp   . Sleep apnea   . Tobacco abuse     Review of Systems:   ROS Per HPI  Physical Exam:  Vitals:   05/07/17 1057  BP: (!) 164/96  Pulse: (!) 103  Temp: 98.3 F (36.8 C)  TempSrc: Oral  SpO2: 94%  Weight: 221 lb (100.2 kg)   GENERAL- alert, co-operative, appears as stated age, not in any distress. HEENT- oral mucosa appears moist, unable to evaluate JVD due to trach collar CARDIAC- RRR, no murmurs,  rubs or gallops. RESP- Moving equal volumes of air, mild diffuse wheezing throughout, rales or areas of decreased breath sounds. No increased work of breathing. Speaking in full sentences. ABDOMEN- Obese, soft, nontender, bowel sounds present NEURO- No obvious Cr N abnormality. EXTREMITIES- pulse 2+, symmetric, no pedal edema. SKIN- Warm, dry, no rash or lesion. PSYCH- Normal mood and affect, appropriate thought content and speech.  Assessment & Plan:   See Encounters Tab for problem based charting.   Patient discussed with Dr. Gerrit Friends, MD Internal Medicine PGY2

## 2017-05-07 NOTE — Assessment & Plan Note (Addendum)
Patient with symptoms of uncontrolled GERD and bloating. On exam, she does not appear volume overloaded and does not have signs of ascites to make me think her abdominal bloating is from those etiologies.  She does have note of H pylori + antibody from several years ago and it is uncertain that she was treated. If symptoms continue and no other etiology, would consider testing for stool antigen and treating as indicated.  Plan: --ranitidine 150mg  BID

## 2017-05-07 NOTE — Patient Instructions (Addendum)
For your blood pressure: Take carvedilol 3.125mg  twice daily Start lisinopril 10mg  daily  For your diabetes: Increase victoza to 1.8mg  daily Continue metformin 1000mg  twice daily Continue levemir 46 units daily  For your cough: I ordered a chest xray Start azithromycin 500mg  on day 1, then 250mg  on day 2-5.  Make an appointment in 2 weeks for a blood pressure check and in 3 months with Dr. Trilby Drummer for your diabetes.

## 2017-05-07 NOTE — Assessment & Plan Note (Addendum)
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 AMA neg, HepB sAb, cAb, sAg negative (2009); HepC ab + but no viral load; HIV neg. Would need complete abd u/s, repeat HepB serologies with HepA serologies to check for immunity (if neg needs immunization).  I did not get a chance to discuss this with patient today, but it appears at one point she was referred for liver transplant but no notes on this. She had an EGD in 2009 (results not visible) and no other GI notes since then.

## 2017-05-08 NOTE — Progress Notes (Signed)
Internal Medicine Clinic Attending  Case discussed with Dr. Svalina  at the time of the visit.  We reviewed the resident's history and exam and pertinent patient test results.  I agree with the assessment, diagnosis, and plan of care documented in the resident's note.  

## 2017-05-09 ENCOUNTER — Telehealth: Payer: Self-pay | Admitting: Internal Medicine

## 2017-05-09 NOTE — Telephone Encounter (Signed)
Spoke with the pt about these results  She reports that the test was done on 3lpm already, not 2  I spoke with MW and he states in this case, have her increase to 4lpm and repeat ono on 4lpm  Pt aware and order sent to [facility]

## 2017-05-09 NOTE — Telephone Encounter (Signed)
Spoke with patient. She stated that she was returning Leslie's call but she found the information she needed in her VM.   Nothing else needed at time of call.

## 2017-05-10 ENCOUNTER — Ambulatory Visit (HOSPITAL_COMMUNITY)
Admission: RE | Admit: 2017-05-10 | Discharge: 2017-05-10 | Disposition: A | Discharge status: Home / Self Care | Payer: Medicare Other | Source: Ambulatory Visit | Attending: Acute Care | Admitting: Acute Care

## 2017-05-10 DIAGNOSIS — Z430 Encounter for attention to tracheostomy: Secondary | ICD-10-CM | POA: Diagnosis not present

## 2017-05-10 DIAGNOSIS — Z930 Tracheostomy status: Secondary | ICD-10-CM | POA: Diagnosis not present

## 2017-05-10 DIAGNOSIS — D869 Sarcoidosis, unspecified: Secondary | ICD-10-CM | POA: Diagnosis not present

## 2017-05-10 DIAGNOSIS — G4737 Central sleep apnea in conditions classified elsewhere: Secondary | ICD-10-CM

## 2017-05-10 NOTE — Progress Notes (Signed)
[facility]   Reason for visit: Routine tracheostomy change  HPI: This is a 47 year old female with past medical history of sarcoidosis, as well as difficult airway, chronic tracheostomy dependence.  She has been trach dependent for some time she returns today for routine tracheostomy change  ROS General: No fever, chills, malaise HEENT: No headache nasal discharge.  Has had tracheal discharge this is improving.  Recently treated for upper airway infection/bronchitis Cardiac: No pain, palpitations, dizziness GI: No nausea vomiting diarrhea weight changes Musculoskeletal: No pain, focal weakness. Neuro: No headache, no dizziness, no strength loss.  Vital signs: Heart rate 52 respirations 20 saturations 93% room air blood pressure 178/89  Exam: General: This is a pleasant 47 year old female resting comfortably in the chair she is in no acute distress HEENT: Normocephalic atraumatic.  Has a tracheostomy in place.  There is some caked yellow dried secretions over the trach stoma wall.  Has a very strong phonation. Cardiac regular rate and rhythm without murmur rub or gallop Abdomen: Soft nontender no organomegaly positive bowel sounds Extremity: Soft, no significant edema brisk cap refill Neuro: Awake alert oriented no focal deficits.  Trach change/procedure #4 Shiley tracheostomy change without difficulty   Impression/dx Tracheostomy dependence Difficult airway Severe sleep apnea  Discussion I do not think she is a candidate for decannulation  Plan Return office visit 3 months Routine tracheostomy care Follow-up as needed between 3 months visits    Visit time: 29 minutes.   [name] ACNP-BC [place]

## 2017-05-10 NOTE — Progress Notes (Signed)
Tracheostomy Procedure Note  [facility] [id number] Apr 06, 1971  Pre Procedure Tracheostomy Information  Trach Brand: Shiley Size: 4.0 Style: Uncuffed Secured by: Velcro   Procedure: trach change    Post Procedure Tracheostomy Information  Trach Brand: Shiley Size: 4.0 Style: Uncuffed Secured by: Velcro   Post Procedure Evaluation:  ETCO2 positive color change from yellow to purple : Yes.   Vital signs:blood pressure 178/89, pulse 52, respirations 20 and pulse oximetry 93 % Patients current condition: stable Complications: No apparent complications Trach site exam: clean, dry Wound care done: dry and 4 x 4 gauze Patient did tolerate procedure well.   Education: none  Prescription needs: none    Additional needs: none

## 2017-05-22 ENCOUNTER — Ambulatory Visit: Payer: Medicare Other

## 2017-06-06 ENCOUNTER — Emergency Department (HOSPITAL_COMMUNITY)
Admission: EM | Admit: 2017-06-06 | Discharge: 2017-06-06 | Discharge status: Against Medical Advice | Payer: Medicare Other

## 2017-06-06 ENCOUNTER — Encounter: Payer: Self-pay | Admitting: Internal Medicine

## 2017-06-06 NOTE — ED Notes (Signed)
Called for triage multiple times with no response

## 2017-06-06 NOTE — ED Notes (Signed)
Pt called to be triaged with no response.  RN notified.

## 2017-06-07 ENCOUNTER — Encounter: Payer: Self-pay | Admitting: Internal Medicine

## 2017-06-07 ENCOUNTER — Other Ambulatory Visit: Payer: Self-pay

## 2017-06-07 ENCOUNTER — Ambulatory Visit (INDEPENDENT_AMBULATORY_CARE_PROVIDER_SITE_OTHER): Payer: Medicare Other | Admitting: Internal Medicine

## 2017-06-07 VITALS — BP 131/74 | HR 111 | Temp 98.5°F | Ht 61.0 in | Wt 214.0 lb

## 2017-06-07 DIAGNOSIS — J111 Influenza due to unidentified influenza virus with other respiratory manifestations: Secondary | ICD-10-CM | POA: Diagnosis present

## 2017-06-07 DIAGNOSIS — K824 Cholesterolosis of gallbladder: Secondary | ICD-10-CM | POA: Diagnosis not present

## 2017-06-07 DIAGNOSIS — J101 Influenza due to other identified influenza virus with other respiratory manifestations: Secondary | ICD-10-CM

## 2017-06-07 DIAGNOSIS — N183 Chronic kidney disease, stage 3 (moderate): Secondary | ICD-10-CM | POA: Diagnosis not present

## 2017-06-07 DIAGNOSIS — I10 Essential (primary) hypertension: Secondary | ICD-10-CM

## 2017-06-07 DIAGNOSIS — I129 Hypertensive chronic kidney disease with stage 1 through stage 4 chronic kidney disease, or unspecified chronic kidney disease: Secondary | ICD-10-CM | POA: Diagnosis not present

## 2017-06-07 MED ORDER — OSELTAMIVIR PHOSPHATE 75 MG PO CAPS: 75.0000 mg | ORAL_CAPSULE | Freq: Two times a day (BID) | ORAL | 0 refills | Status: AC

## 2017-06-07 NOTE — Assessment & Plan Note (Signed)
The patient presents with a 2-day history of productive cough bringing up dark yellow sputum.  The patient states that she coughs to the point of vomiting and she has vomited once.  She has accompanying nausea, runny nose, hoarseness, sinus pressure, headache, shortness of breath, and upper abdominal pain from coughing.  Patient is on 2 L via nasal cannula and states that she feels like she needs to increase the amount of oxygen.  However, the patient's oxygen tank does not allow her to go beyond 2 L.  In the clinic the patient was saturating at 100%.  That the patient denies any sore throat.  Patient states that she has been feeling feverish and itch and when she checked her last temperature was 100.1.  She has muscle aches and has been feeling fatigued.  Patient states that she has had sick contacts and one with known flu.  Due to the patient's symptoms and high rate of flu the patient has been diagnosed with flu during this visit.  -Prescribed Tamiflu 75 mg twice daily for 5 days -Instructed the patient to get plenty of rest and drink lots of fluids

## 2017-06-07 NOTE — Assessment & Plan Note (Signed)
ALP of 252 noted on 08/19/2015.  Gallbladder polyps with the largest being 1.2 mm.  Also had previous ultrasound which showed gallstones. -Repeat ALP drawn today to evaluate progression of gallbladder disease

## 2017-06-07 NOTE — Patient Instructions (Addendum)
It was a pleasure to see you today [facility]. During this visit you were diagnosed with the flu. Please make the following changes:   -Please take tamiflu 75mg  twice daily for 5 days -please drink plenty of fluids -take rest   If you have any questions or concerns, please call our clinic at [phone] between 9am-5pm and after hours call [phone] and ask for the internal medicine resident on call. If you feel you are having a medical emergency please call 911.   Thank you, we look forward to help you remain healthy!  Lars Mage, MD Internal Medicine PGY1   FOLLOW-UP INSTRUCTIONS When: 3 months For: Diabetes follow up What to bring: Meter and medications    Influenza, Adult Influenza ("the flu") is an infection in the lungs, nose, and throat (respiratory tract). It is caused by a virus. The flu causes many common cold symptoms, as well as a high fever and body aches. It can make you feel very sick. The flu spreads easily from person to person (is contagious). Getting a flu shot (influenza vaccination) every year is the best way to prevent the flu. Follow these instructions at home:  Take over-the-counter and prescription medicines only as told by your doctor.  Use a cool mist humidifier to add moisture (humidity) to the air in your home. This can make it easier to breathe.  Rest as needed.  Drink enough fluid to keep your pee (urine) clear or pale yellow.  Cover your mouth and nose when you cough or sneeze.  Wash your hands with soap and water often, especially after you cough or sneeze. If you cannot use soap and water, use hand sanitizer.  Stay home from work or school as told by your doctor. Unless you are visiting your doctor, try to avoid leaving home until your fever has been gone for 24 hours without the use of medicine.  Keep all follow-up visits as told by your doctor. This is important. How is this prevented?  Getting a yearly (annual) flu shot is the best  way to avoid getting the flu. You may get the flu shot in late summer, fall, or winter. Ask your doctor when you should get your flu shot.  Wash your hands often or use hand sanitizer often.  Avoid contact with people who are sick during cold and flu season.  Eat healthy foods.  Drink plenty of fluids.  Get enough sleep.  Exercise regularly. Contact a doctor if:  You get new symptoms.  You have: ? Chest pain. ? Watery poop (diarrhea). ? A fever.  Your cough gets worse.  You start to have more mucus.  You feel sick to your stomach (nauseous).  You throw up (vomit). Get help right away if:  You start to be short of breath or have trouble breathing.  Your skin or nails turn a bluish color.  You have very bad pain or stiffness in your neck.  You get a sudden headache.  You get sudden pain in your face or ear.  You cannot stop throwing up. This information is not intended to replace advice given to you by your health care provider. Make sure you discuss any questions you have with your health care provider. Document Released: 01/25/2008 Document Revised: 09/23/2015 Document Reviewed: 02/09/2015 Elsevier Interactive Patient Education  2017 [employer].

## 2017-06-07 NOTE — Assessment & Plan Note (Signed)
Patient has a blood pressure noted of 131/74, 111.  The patient is likely to be tachycardic due to being acutely ill.  He is taking carvedilol 3.125 mgTwice daily, lisinopril 10 mg daily.  Patient blood pressure is well controlled.

## 2017-06-07 NOTE — Progress Notes (Signed)
CC: Cough  HPI:  [name] is a 47 y.o. female with history of OSA s/p trach, retention, CKD 3 who presents for cough. Please see problem based charting for evaluation, assessment, and plan.   Past Medical History:  Diagnosis Date  . Abnormal LFTs (liver function tests)    Liver U/S and exam c/w HSM. Hep B serology neg. but Hep C ab +, HIV neg. 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 Acute bronchitis 07/29/2014  . Anemia   . Cardiomyopathy, nonischemic ([place])    a. Varying EF over the years - initially 35% in 09/2010. Normal cors 12/2010. b. Echo 07/2014: EF 55-60%, no RWMA, + diastolic flattening and systolic flattening c/w RV volume and pressure overload, mild LAE/RAE, mod dilated RV, mod TR, PASP severely increased at 75mmHg. c. RHC 07/2014: moderate pulmonary HTN likely WHO group 3 with markedly elevated CVP and relatively normal left sided pressures.  . Chronic diastolic CHF (congestive heart failure) ([place])   . Chronic respiratory failure ([place])    a. became O2 dependent in July 2011. b. She required trache placement in 07/2013 and has been followed by pulmonology as well.  . CKD (chronic kidney disease), stage II   . Complication of anesthesia    " difficult waking "  . Diabetes mellitus    insulin dependent  . Diabetic retinopathy    Right eye 2/11  . Essential hypertension   . Health maintenance examination    Mammogram 05/2010 Negative; Last Pap smear 03/2008; Last DM eye exam 2/11> mild non-proliferative diabetic retinopathy. OD  . Helicobacter pylori ab+ 05/2011   Pt was symptomatic and treatment planned for 05/2011  . Hx of cardiac cath 2/08   No CAD, no RAS,  normal EF  . Hypokalemia   . Hypomagnesemia   . Hypoxemia    CT angiogram 9/11>> No PE; PFTs 10/11- FEV1 1.20 (49%) with 16% better p B2, DLCO 33%> corrects to 84; O2 sats ok on 4 lpm X rapid walk X 3 laps 05/2010  . Long term current use of systemic steroids     . Morbid obesity ([place])    Target wt= 153 for BMI <30  . Obesity hypoventilation syndrome ([place])   . Pulmonary hypertension ([place])   . QT prolongation   . Sarcoidosis    Followed by Dr. Melvyn Novas; w/ liver involvement per biopsy 12/09, Reversible airway component so started on [facility] 01/2010; HFA 75% p coaching 05/2010  . Seborrheic dermatitis of scalp   . Sleep apnea   . Tobacco abuse    Review of Systems: Runny nose, cough, headache, shortness of breath  Physical Exam:  Vitals:   06/07/17 1534  BP: 131/74  Pulse: (!) 111  Temp: 98.5 F (36.9 C)  TempSrc: Oral  SpO2: 100%  Weight: 214 lb (97.1 kg)  Height: 5\' 1"  (1.549 m)   Physical Exam  Constitutional: She appears well-developed and well-nourished. No distress.  HENT:  Head: Normocephalic and atraumatic.  Mouth/Throat: Oropharynx is clear and moist.  Eyes: Conjunctivae are normal.  Cardiovascular: Normal rate, regular rhythm and normal heart sounds.  Respiratory: Effort normal. No respiratory distress. She has wheezes (Diffuse).  GI: Soft. Bowel sounds are normal. She exhibits no distension. There is no tenderness.  Musculoskeletal: She exhibits no edema.  Neurological: She is alert.  Skin: She is not diaphoretic. No erythema.  Psychiatric: She has a normal mood and affect. Her behavior  is normal. Judgment and thought content normal.    Assessment & Plan:   See Encounters Tab for problem based charting.  Patient discussed with Dr. Angelia Mould

## 2017-06-08 LAB — CMP14 + ANION GAP
ALT: 28 IU/L (ref 0–32)
AST: 38 IU/L (ref 0–40)
Albumin/Globulin Ratio: 1.2 (ref 1.2–2.2)
Albumin: 3.4 g/dL — ABNORMAL LOW (ref 3.5–5.5)
Alkaline Phosphatase: 152 IU/L — ABNORMAL HIGH (ref 39–117)
Anion Gap: 13 mmol/L (ref 10.0–18.0)
BUN/Creatinine Ratio: 8 — ABNORMAL LOW (ref 9–23)
BUN: 14 mg/dL (ref 6–24)
Bilirubin Total: <0.2 mg/dL (ref 0.0–1.2)
CO2: 21 mmol/L (ref 20–29)
Calcium: 8.3 mg/dL — ABNORMAL LOW (ref 8.7–10.2)
Chloride: 104 mmol/L (ref 96–106)
Creatinine, Ser: 1.72 mg/dL — ABNORMAL HIGH (ref 0.57–1.00)
GFR calc Af Amer: 40 mL/min/{1.73_m2} — ABNORMAL LOW (ref >59)
GFR calc non Af Amer: 35 mL/min/{1.73_m2} — ABNORMAL LOW (ref >59)
Globulin, Total: 2.8 g/dL (ref 1.5–4.5)
Glucose: 150 mg/dL — ABNORMAL HIGH (ref 65–99)
Potassium: 4.5 mmol/L (ref 3.5–5.2)
Sodium: 138 mmol/L (ref 134–144)
Total Protein: 6.2 g/dL (ref 6.0–8.5)

## 2017-06-09 NOTE — Progress Notes (Signed)
Internal Medicine Clinic Attending  Case discussed with Dr. Maricela Bo at the time of the visit.  We reviewed the resident's history and exam and pertinent patient test results.  I agree with the assessment, diagnosis, and plan of care documented in the resident's note. Empiric tx for flu.  In review of her chart however, it appears she was never previously referred for elective cholecystectomy, we repeated LFTs today.  Needs general surgery referral.

## 2017-07-16 ENCOUNTER — Telehealth: Payer: Self-pay | Admitting: Internal Medicine

## 2017-07-16 DIAGNOSIS — K824 Cholesterolosis of gallbladder: Secondary | ICD-10-CM

## 2017-07-16 NOTE — Telephone Encounter (Signed)
Called patient to inform her that with her repeat ALP being elevated on 06/07/17 and her Ultrasound 10/20/2015 showing "tiny" gallstones and non-mobile echogenic non-shadowing foci likely reflecting polyps measuring up to 1.2 mm; I would like for her to have a repeat ultrasound done to confirm the presence of polyps because gall stones and polyps together are an indication for surgical referral even in asymptomatic cholelithiasis.   She agrees with the plan. Ultrasound to be ordered and scheduled with patient.  Pearson Grippe, DO IM PGY-1

## 2017-07-23 ENCOUNTER — Other Ambulatory Visit: Payer: Self-pay

## 2017-07-23 ENCOUNTER — Ambulatory Visit (HOSPITAL_COMMUNITY): Payer: Medicare Other

## 2017-07-23 ENCOUNTER — Ambulatory Visit (INDEPENDENT_AMBULATORY_CARE_PROVIDER_SITE_OTHER): Payer: Medicare Other | Admitting: Internal Medicine

## 2017-07-23 DIAGNOSIS — F17210 Nicotine dependence, cigarettes, uncomplicated: Secondary | ICD-10-CM | POA: Diagnosis not present

## 2017-07-23 DIAGNOSIS — I10 Essential (primary) hypertension: Secondary | ICD-10-CM

## 2017-07-23 DIAGNOSIS — Z79899 Other long term (current) drug therapy: Secondary | ICD-10-CM

## 2017-07-23 DIAGNOSIS — Z930 Tracheostomy status: Secondary | ICD-10-CM

## 2017-07-23 MED ORDER — LISINOPRIL 20 MG PO TABS: 20.0000 mg | ORAL_TABLET | Freq: Every day | ORAL | 11 refills | Status: DC

## 2017-07-23 MED ORDER — CARVEDILOL 6.25 MG PO TABS: 6.2500 mg | ORAL_TABLET | Freq: Two times a day (BID) | ORAL | 2 refills | Status: AC

## 2017-07-23 NOTE — Patient Instructions (Addendum)
Thank you for visiting clinic today. Your blood pressure was little elevated today, I am increasing the dose of Coreg from 3.125 mg twice a day to 6.25 mg twice daily. I am also increasing the dose of lisinopril to 20 mg daily. Follow-up in 4 weeks with your PCP.

## 2017-07-23 NOTE — Progress Notes (Signed)
CC: Elevated blood pressure in a dentist office.  HPI:  [name] is a 48 y.o. past medical history as listed below was sent to clinic from her dentist office, where she was seen this morning for tooth extraction, her blood pressure was above 161 systolic at the dentist office.  Patient was only complaining of toothache and really wants to have that tooth pulled out.  She denies any headache or blurry vision.  She denies any chest pain or shortness of breath. According to patient when she checked her blood pressure at home before going to the dentist it was 096 systolic. In our office her blood pressure was in initially 149/86 and then on recheck 163/91.  She did took her medication this morning.  She has no other complaints.   Past Medical History:  Diagnosis Date  . Abnormal LFTs (liver function tests)    Liver U/S and exam c/w HSM. Hep B serology neg. but Hep C ab +, HIV neg. 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 Acute bronchitis 07/29/2014  . Anemia   . Cardiomyopathy, nonischemic ([place])    a. Varying EF over the years - initially 35% in 09/2010. Normal cors 12/2010. b. Echo 07/2014: EF 55-60%, no RWMA, + diastolic flattening and systolic flattening c/w RV volume and pressure overload, mild LAE/RAE, mod dilated RV, mod TR, PASP severely increased at 14mmHg. c. RHC 07/2014: moderate pulmonary HTN likely WHO group 3 with markedly elevated CVP and relatively normal left sided pressures.  . Chronic diastolic CHF (congestive heart failure) ([place])   . Chronic respiratory failure ([place])    a. became O2 dependent in July 2011. b. She required trache placement in 07/2013 and has been followed by pulmonology as well.  . CKD (chronic kidney disease), stage II   . Complication of anesthesia    " difficult waking "  . Diabetes mellitus    insulin dependent  . Diabetic retinopathy    Right eye 2/11  . Essential hypertension   .  Health maintenance examination    Mammogram 05/2010 Negative; Last Pap smear 03/2008; Last DM eye exam 2/11> mild non-proliferative diabetic retinopathy. OD  . Helicobacter pylori ab+ 05/2011   Pt was symptomatic and treatment planned for 05/2011  . Hx of cardiac cath 2/08   No CAD, no RAS,  normal EF  . Hypokalemia   . Hypomagnesemia   . Hypoxemia    CT angiogram 9/11>> No PE; PFTs 10/11- FEV1 1.20 (49%) with 16% better p B2, DLCO 33%> corrects to 84; O2 sats ok on 4 lpm X rapid walk X 3 laps 05/2010  . Long term current use of systemic steroids   . Morbid obesity ([place])    Target wt= 153 for BMI <30  . Obesity hypoventilation syndrome ([place])   . Pulmonary hypertension ([place])   . QT prolongation   . Sarcoidosis    Followed by Dr. Melvyn Novas; w/ liver involvement per biopsy 12/09, Reversible airway component so started on [facility] 01/2010; HFA 75% p coaching 05/2010  . Seborrheic dermatitis of scalp   . Sleep apnea   . Tobacco abuse    Review of Systems: Negative except mentioned in HPI.  Physical Exam:  Vitals:   07/23/17 0915  BP: (!) 149/86  Pulse: 97  Temp: 98 F (36.7 C)  TempSrc: Oral  SpO2: 100%  Weight: 214 lb 11.2 oz (97.4 kg)  Height: 5\' 1"  (1.549 m)  General: Vital signs reviewed.  Patient is well-developed obese with trach collar, in no acute distress and cooperative with exam.  Head: Normocephalic and atraumatic. Eyes: EOMI, conjunctivae normal, no scleral icterus.  Cardiovascular: RRR, S1 normal, S2 normal, no murmurs, gallops, or rubs. Pulmonary/Chest: Little coarse breath sounds laterally, no rhonchi or wheeze. Abdominal: Soft, non-tender, non-distended, BS +, no masses, organomegaly, or guarding present.  Extremities: No lower extremity edema bilaterally,  pulses symmetric and intact bilaterally. No cyanosis or clubbing. Skin: Warm, dry and intact. No rashes or erythema. Psychiatric: Normal mood and affect. speech and behavior is normal. Cognition and memory are  normal.  Assessment & Plan:   See Encounters Tab for problem based charting.  Patient discussed with Dr. Lynnae January.

## 2017-07-23 NOTE — Assessment & Plan Note (Signed)
BP Readings from Last 3 Encounters:  07/23/17 (!) 163/91  06/07/17 131/74  05/07/17 (!) 164/96   Her blood pressure was elevated today. On initial check her blood pressure was 149/86. She might have become very anxious or pain might be causing elevation in her blood pressure.  Increase Coreg to 6.25 mg twice daily. Increase lisinopril to 20 mg daily. Talk with her dentist office and she will have her tooth extraction tomorrow morning, a letter was sent with her stating her current blood pressure and other management.

## 2017-07-24 NOTE — Progress Notes (Signed)
Internal Medicine Clinic Attending  Case discussed with Dr. Amin at the time of the visit.  We reviewed the resident's history and exam and pertinent patient test results.  I agree with the assessment, diagnosis, and plan of care documented in the resident's note.    

## 2017-07-26 ENCOUNTER — Ambulatory Visit (HOSPITAL_COMMUNITY)
Admission: RE | Admit: 2017-07-26 | Discharge: 2017-07-26 | Disposition: A | Discharge status: Home / Self Care | Payer: Medicare Other | Source: Ambulatory Visit | Attending: Internal Medicine | Admitting: Internal Medicine

## 2017-07-26 DIAGNOSIS — K8020 Calculus of gallbladder without cholecystitis without obstruction: Secondary | ICD-10-CM | POA: Diagnosis not present

## 2017-07-26 DIAGNOSIS — K824 Cholesterolosis of gallbladder: Secondary | ICD-10-CM | POA: Diagnosis not present

## 2017-08-01 ENCOUNTER — Ambulatory Visit (HOSPITAL_COMMUNITY)
Admission: RE | Admit: 2017-08-01 | Discharge: 2017-08-01 | Disposition: A | Discharge status: Home / Self Care | Payer: Medicare Other | Source: Ambulatory Visit | Attending: Acute Care | Admitting: Acute Care

## 2017-08-01 NOTE — Progress Notes (Signed)
[facility]   Reason for visit: Routine trach care   HPI: 38yof w/ sig h/o sarcoidosis and severe OSA. Also difficult airway. Trach dependence d/t difficult airway and desire to keep trach over CPAP   ROS General: No fevers chills, sick exposures.  HEENT: Did have some recent jaw pain following teeth extraction this is now resolved.  No nasal discharge, tracheal drainage, or headache.  Pulmonary: No shortness of breath, no significant cough, no productive sputum, no wheezing. Cardiac: No chest pain palpitations or swelling abdomen: No nausea vomiting diarrhea GU: No difficulty voiding neuro: No headache, gait disturbance, mental status issues or memory issues  Vital signs: Reviewed   Exam: General: 47 year old female ambulatory, no acute distress HEENT: #4 Shiley tracheostomy stoma site unremarkable no drainage or discharge Pulmonary: Clear to auscultation no accessory use Cardiac: Regular rate and rhythm Abdomen: Soft nontender no organomegaly Extremities: Brisk cap refill no edema Neuro: Awake oriented no focal deficits  Trach change/procedure #4 shiley    Impression/dx Severe OSA Trach dependent  Difficult airway H/o sarcoidosis  Chronic respiratory failure  Discussion 47 year old female with chronic respiratory failure in setting of sarcoidosis.  Also chronically tracheostomy dependent.  From a tracheostomy standpoint things are stable without acute issues.  When the biggest issues is not necessarily in regards to her trach but more in terms of her chronic respiratory failure.  She does not wear her oxygen when she is ambulatory.  Primarily because the oxygen is so heavy.   Plan ROV 3 months Routine trach care  We will see if we can get her a more portable oxygen delivery device   Visit time: 30 minutes.   [name] ACNP-BC [place]

## 2017-08-03 ENCOUNTER — Telehealth: Payer: Self-pay | Admitting: Internal Medicine

## 2017-08-03 DIAGNOSIS — K824 Cholesterolosis of gallbladder: Secondary | ICD-10-CM

## 2017-08-03 NOTE — Telephone Encounter (Signed)
Called patient to inform her of the results of her recent ultrasound, which showed a gall bladder polyp and gallstones in her gallbladder. Patient informed that she would be receiving a referral to Surgery for evaluation for an elective cholecystectomy as presence of stones and polyp together are an indication for gallbladder removal even in the absence of symptoms. Patient states that she understands the plan agrees to see surgery for evaluation. She has no questions at this time.   Pearson Grippe, DO IM PGY-1

## 2017-08-20 ENCOUNTER — Ambulatory Visit: Payer: Medicare Other

## 2017-08-23 ENCOUNTER — Ambulatory Visit: Payer: Medicare Other

## 2017-08-30 ENCOUNTER — Ambulatory Visit: Payer: Medicare Other

## 2017-08-30 ENCOUNTER — Encounter: Payer: Self-pay | Admitting: Internal Medicine

## 2017-08-30 ENCOUNTER — Telehealth: Payer: Self-pay | Admitting: Internal Medicine

## 2017-08-31 ENCOUNTER — Other Ambulatory Visit: Payer: Self-pay | Admitting: *Deleted

## 2017-08-31 DIAGNOSIS — J9611 Chronic respiratory failure with hypoxia: Secondary | ICD-10-CM

## 2017-08-31 DIAGNOSIS — K8020 Calculus of gallbladder without cholecystitis without obstruction: Secondary | ICD-10-CM | POA: Diagnosis not present

## 2017-08-31 NOTE — Addendum Note (Signed)
Addended by: Lorretta Harp on: 08/31/2017 04:12 PM   Modules accepted: Orders

## 2017-08-31 NOTE — Progress Notes (Signed)
Called pt to verify with the POC if she was on continuous flow or pulse flow.   Pt stated she was on 2L continuous. Placed the order to [facility] with all the stated information per [name], NP.  Nothing further needed at this time.

## 2017-09-03 ENCOUNTER — Other Ambulatory Visit: Payer: Self-pay | Admitting: General Surgery

## 2017-09-03 DIAGNOSIS — K8020 Calculus of gallbladder without cholecystitis without obstruction: Secondary | ICD-10-CM

## 2017-09-06 ENCOUNTER — Ambulatory Visit
Admission: RE | Admit: 2017-09-06 | Discharge: 2017-09-06 | Disposition: A | Discharge status: Home / Self Care | Payer: Medicare Other | Source: Ambulatory Visit | Attending: General Surgery | Admitting: General Surgery

## 2017-09-06 DIAGNOSIS — K8020 Calculus of gallbladder without cholecystitis without obstruction: Secondary | ICD-10-CM

## 2017-09-12 ENCOUNTER — Encounter: Payer: Self-pay | Admitting: Acute Care

## 2017-10-24 ENCOUNTER — Inpatient Hospital Stay (HOSPITAL_COMMUNITY): Admission: RE | Admit: 2017-10-24 | Payer: Medicare Other | Source: Ambulatory Visit

## 2017-11-08 ENCOUNTER — Ambulatory Visit (HOSPITAL_COMMUNITY)
Admission: RE | Admit: 2017-11-08 | Discharge: 2017-11-08 | Disposition: A | Discharge status: Home / Self Care | Payer: Medicare Other | Source: Ambulatory Visit | Attending: Internal Medicine | Admitting: Internal Medicine

## 2017-11-08 DIAGNOSIS — D869 Sarcoidosis, unspecified: Secondary | ICD-10-CM | POA: Diagnosis not present

## 2017-11-08 DIAGNOSIS — Z4589 Encounter for adjustment and management of other implanted devices: Secondary | ICD-10-CM | POA: Diagnosis not present

## 2017-11-08 DIAGNOSIS — J9610 Chronic respiratory failure, unspecified whether with hypoxia or hypercapnia: Secondary | ICD-10-CM | POA: Insufficient documentation

## 2017-11-08 DIAGNOSIS — Z930 Tracheostomy status: Secondary | ICD-10-CM | POA: Diagnosis not present

## 2017-11-08 DIAGNOSIS — G4733 Obstructive sleep apnea (adult) (pediatric): Secondary | ICD-10-CM | POA: Diagnosis not present

## 2017-11-08 DIAGNOSIS — G4730 Sleep apnea, unspecified: Secondary | ICD-10-CM | POA: Insufficient documentation

## 2017-11-08 NOTE — Progress Notes (Signed)
Tracheostomy Procedure Note  [facility] [id number] [birth year]  Pre Procedure Tracheostomy Information  Trach Brand: Shiley Size: 4.0 Style: Uncuffed Secured by: Velcro   Procedure: trach change    Post Procedure Tracheostomy Information  Trach Brand: Shiley Size: 4.0 Style: Uncuffed Secured by: Velcro   Post Procedure Evaluation:  ETCO2 positive color change from yellow to purple : Yes.   Vital signs:blood pressure 189/103, pulse 106, respirations 17 and pulse oximetry 94 % Patients current condition: stable Complications: No apparent complications Trach site exam: clean, dry Wound care done: dry and 4 x 4 gauze Patient did tolerate procedure well.   Education: none  Prescription needs: none    Additional needs: 12 week follow up

## 2017-11-08 NOTE — Progress Notes (Signed)
[facility]   Reason for visit: Routine tracheostomy care  HPI: 47 year old female patient well-known to me I follow her for tracheostomy management.  She has history of chronic respiratory failure in the setting of sarcoidosis.  She is tracheostomy dependent due to severe sleep apnea but more importantly because of significant difficulty airway ultimately required tracheostomy following traumatic intubation.  We discussed consideration for decannulation however given how difficult it was to intubate her the patient has elected to keep tracheostomy in case of further respiratory distress down the road.  She is been doing well from a tracheostomy standpoint with no new complaints she returns today for routine tracheostomy change  ROS General-constitutional: No fevers chills.  HEENT: No headaches, nasal congestion, sore throat, tracheal discharge or stoma site soreness pulmonary: No chest pain, no shortness of breath, no cough, no wheezing.  Cardiac: No chest pain, palpitations, lower extremity edema.  Abdomen: No nausea vomiting diarrhea no weight change, no change in diet.  GU: Negative, denies urinary urgency, frequency, or foul smelling urine.  Neuro: Denies headache, gait disturbance, memory changes or focal weakness.  Vital signs: Heart rate 106 blood pressure 189/103 respiration 17 saturations 94%  Exam: General: 47 year old female currently sitting up in bed she is ambulatory, walked into the tracheostomy clinic today she is in no acute distress HEENT size #4 tracheostomy in place.  Phonation is excellent with Passy-Muir valve.  Tracheostomy stoma is unremarkable. Pulmonary: Clear to auscultation Cardiac: Regular rate and rhythm Abdomen: Soft nontender Extremities: Brisk cap refill warm, dry, no edema Neuro: Awake alert oriented no focal deficits  Trach change/procedure #4 tracheostomy was removed without difficulty.  Stoma site was unremarkable.  Size 4  cuffless Shiley tracheostomy was put in its place without difficulty.  I did notice it seemed as though her tracheostomy stoma took a more acute diet within what I remember in the past.  However placement itself was not difficult.   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  She is chronically tracheostomy dependent due to severe sleep apnea, further complicated by difficult airway.  The decision was to keep her trach indefinitely.  She is doing well from a tracheostomy standpoint.  Plan Return visit in 12 weeks for routine tracheostomy change    Visit time: 32 minutes.   [name] ACNP-BC [place]

## 2017-11-21 ENCOUNTER — Other Ambulatory Visit: Payer: Self-pay | Admitting: Internal Medicine

## 2017-11-21 DIAGNOSIS — E113299 Type 2 diabetes mellitus with mild nonproliferative diabetic retinopathy without macular edema, unspecified eye: Secondary | ICD-10-CM

## 2017-11-21 DIAGNOSIS — Z794 Long term (current) use of insulin: Principal | ICD-10-CM

## 2017-11-22 NOTE — Telephone Encounter (Signed)
Refill for Levemir, syringes, and needles approved

## 2018-01-16 ENCOUNTER — Ambulatory Visit (HOSPITAL_COMMUNITY)
Admission: RE | Admit: 2018-01-16 | Discharge: 2018-01-16 | Disposition: A | Discharge status: Home / Self Care | Payer: Medicare Other | Source: Ambulatory Visit | Attending: Acute Care | Admitting: Acute Care

## 2018-01-16 DIAGNOSIS — R079 Chest pain, unspecified: Secondary | ICD-10-CM | POA: Insufficient documentation

## 2018-01-16 DIAGNOSIS — D860 Sarcoidosis of lung: Secondary | ICD-10-CM | POA: Insufficient documentation

## 2018-01-16 DIAGNOSIS — J9610 Chronic respiratory failure, unspecified whether with hypoxia or hypercapnia: Secondary | ICD-10-CM | POA: Diagnosis not present

## 2018-01-16 DIAGNOSIS — G4733 Obstructive sleep apnea (adult) (pediatric): Secondary | ICD-10-CM | POA: Insufficient documentation

## 2018-01-16 DIAGNOSIS — R002 Palpitations: Secondary | ICD-10-CM | POA: Insufficient documentation

## 2018-01-16 DIAGNOSIS — Z430 Encounter for attention to tracheostomy: Secondary | ICD-10-CM | POA: Diagnosis not present

## 2018-01-16 DIAGNOSIS — Z930 Tracheostomy status: Secondary | ICD-10-CM

## 2018-01-16 DIAGNOSIS — R42 Dizziness and giddiness: Secondary | ICD-10-CM | POA: Insufficient documentation

## 2018-01-16 DIAGNOSIS — M7989 Other specified soft tissue disorders: Secondary | ICD-10-CM | POA: Insufficient documentation

## 2018-01-16 NOTE — Progress Notes (Signed)
Tracheostomy Procedure Note  Deannie Resetar [id number] 1970-05-13  Pre Procedure Tracheostomy Information  Trach Brand: Shiley Size: 4.0 Style: Uncuffed Secured by: Velcro   Procedure: trach change    Post Procedure Tracheostomy Information  Trach Brand: Shiley Size: 4.0 Style: Uncuffed Secured by: Velcro   Post Procedure Evaluation:  ETCO2 positive color change from yellow to purple : No. Vital signs: pulse 99, respirations 22 and pulse oximetry 92 % Patients current condition: stable Complications: No apparent complications Trach site exam: clean, dry Wound care done: dry and 4 x 4 gauze Patient did tolerate procedure well.   Education: none  Prescription needs: none    Additional needs: New PMV given.  12 week follow up

## 2018-01-16 NOTE — Progress Notes (Signed)
[facility]   Reason for visit:  Routine tracheostomy change HPI:  47 year old female patient with chronic respiratory failure in the setting of sarcoidosis, she is tracheostomy dependent due to severe sleep apnea but more importantly due to difficult airway following traumatic intubation.  She is tracheostomy dependent for life.  She presents today for routine tracheostomy change ROS  General: No fever, chills, did have recent sick exposure however clinically feeling better HEENT: Is having some mild nasal discharge with clear drainage.  No headache, sinus congestion, sore throat, pain at tracheostomy site.  Pulmonary: Does have cough this is with clear sputum.  She attributes this to her recent viral exposure.  Denies increased shortness of breath, denies wheezing, denies requiring more frequent bronchodilators Cardiac: Chest pain, dizziness, lower extremity swelling.  More palpitations. Abdomen: Denies nausea vomiting diarrhea GU: Denies frequency, hesitancy, or dysuria. Neuro: Denies headache, gait disturbance, or memory changes.  Vital signs:  Reviewed.Saturations 92% on room air.  Respirations 22 breaths/min Exam:  Neuro: Pleasant 47 year old female patient currently sitting in chair she is in no acute distress HEENT normocephalic atraumatic size 4 tracheostomy unremarkable with excellent phonation with Passy-Muir valve in place.  Tracheostomy stoma unremarkable. Pulmonary: Clear to auscultation without accessory use Cardiac: Regular rate and rhythm Extremities: Warm and dry brisk cap refill Abdomen: Soft nontender Trach change/procedure: The existing size 4 tracheostomy was removed.  The tracheostomy stoma was localized with viscous lidocaine.  The site was inspected and found to be unremarkable.  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   [name] ACNP-BC [place]

## 2018-01-17 ENCOUNTER — Ambulatory Visit (INDEPENDENT_AMBULATORY_CARE_PROVIDER_SITE_OTHER): Payer: Medicare Other | Admitting: Internal Medicine

## 2018-01-17 ENCOUNTER — Other Ambulatory Visit: Payer: Self-pay

## 2018-01-17 VITALS — BP 141/88 | HR 100 | Temp 98.4°F | Ht 61.0 in | Wt 216.1 lb

## 2018-01-17 DIAGNOSIS — K7469 Other cirrhosis of liver: Secondary | ICD-10-CM | POA: Diagnosis not present

## 2018-01-17 DIAGNOSIS — F17210 Nicotine dependence, cigarettes, uncomplicated: Secondary | ICD-10-CM

## 2018-01-17 DIAGNOSIS — N2889 Other specified disorders of kidney and ureter: Secondary | ICD-10-CM

## 2018-01-17 DIAGNOSIS — N183 Chronic kidney disease, stage 3 unspecified: Secondary | ICD-10-CM

## 2018-01-17 DIAGNOSIS — E113299 Type 2 diabetes mellitus with mild nonproliferative diabetic retinopathy without macular edema, unspecified eye: Secondary | ICD-10-CM | POA: Diagnosis not present

## 2018-01-17 DIAGNOSIS — E785 Hyperlipidemia, unspecified: Secondary | ICD-10-CM | POA: Diagnosis not present

## 2018-01-17 DIAGNOSIS — I10 Essential (primary) hypertension: Secondary | ICD-10-CM | POA: Diagnosis not present

## 2018-01-17 DIAGNOSIS — Z794 Long term (current) use of insulin: Secondary | ICD-10-CM

## 2018-01-17 DIAGNOSIS — Z23 Encounter for immunization: Secondary | ICD-10-CM | POA: Diagnosis not present

## 2018-01-17 LAB — GLUCOSE, CAPILLARY: Glucose-Capillary: 173 mg/dL — ABNORMAL HIGH (ref 70–99)

## 2018-01-17 LAB — POCT GLYCOSYLATED HEMOGLOBIN (HGB A1C): Hemoglobin A1C: 7.8 % — AB (ref 4.0–5.6)

## 2018-01-17 MED ORDER — ATORVASTATIN CALCIUM 40 MG PO TABS: 40.0000 mg | ORAL_TABLET | Freq: Every day | ORAL | 11 refills | Status: AC

## 2018-01-17 MED ORDER — LISINOPRIL 10 MG PO TABS: 10.0000 mg | ORAL_TABLET | Freq: Every day | ORAL | 11 refills | Status: DC

## 2018-01-17 NOTE — Patient Instructions (Signed)
Thank you for allowing [name] to care for you  For your high BP - Please start taking Lisinopril 10mg  daily again  For your diabetes - Please let us know what test strips you need and we will order these - Please begin checking your blood sugar at home at keeping a record of this, and please bring this record to your next visit  For high cholesterol: - We have restarted your atorvastatin  We have ordered som labs to evaluate your kidney's and liver, you will be contacted with these results  We have ordered an ultrasound of your kidney to further evaluate an abnormality seen on your CT scan  Please follow up in about 3 months

## 2018-01-17 NOTE — Progress Notes (Signed)
CC: Hypertension, Diabetes, Cirrhosis, CKD III, Renal Mass, Hyperlidemia  HPI:  Melody Gray is a 47 y.o. F with PMHx listed below presenting for Hypertension, Diabetes, Cirrhosis, CKD III, Renal Mass, Hyperlidemia. Please see the A&P for the status of the patient's chronic medical problems.   Past Medical History:  Diagnosis Date  . Abnormal LFTs (liver function tests)    Liver U/S and exam c/w HSM. Hep B serology neg. but Hep C ab +, HIV neg. 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 Acute bronchitis 07/29/2014  . Anemia   . Cardiomyopathy, nonischemic ([place])    a. Varying EF over the years - initially 35% in 09/2010. Normal cors 12/2010. b. Echo 07/2014: EF 55-60%, no RWMA, + diastolic flattening and systolic flattening c/w RV volume and pressure overload, mild LAE/RAE, mod dilated RV, mod TR, PASP severely increased at 89mmHg. c. RHC 07/2014: moderate pulmonary HTN likely WHO group 3 with markedly elevated CVP and relatively normal left sided pressures.  . Chronic diastolic CHF (congestive heart failure) ([place])   . Chronic respiratory failure ([place])    a. became O2 dependent in July 2011. b. She required trache placement in 07/2013 and has been followed by pulmonology as well.  . CKD (chronic kidney disease), stage II   . Complication of anesthesia    " difficult waking "  . Diabetes mellitus    insulin dependent  . Diabetic retinopathy    Right eye 2/11  . Essential hypertension   . Health maintenance examination    Mammogram 05/2010 Negative; Last Pap smear 03/2008; Last DM eye exam 2/11> mild non-proliferative diabetic retinopathy. OD  . Helicobacter pylori ab+ 05/2011   Pt was symptomatic and treatment planned for 05/2011  . Hx of cardiac cath 2/08   No CAD, no RAS,  normal EF  . Hypokalemia   . Hypomagnesemia   . Hypoxemia    CT angiogram 9/11>> No PE; PFTs 10/11- FEV1 1.20 (49%) with 16% better p B2, DLCO 33%>  corrects to 84; O2 sats ok on 4 lpm X rapid walk X 3 laps 05/2010  . Long term current use of systemic steroids   . Morbid obesity ([place])    Target wt= 153 for BMI <30  . Obesity hypoventilation syndrome ([place])   . Pulmonary hypertension ([place])   . QT prolongation   . Sarcoidosis    Followed by Dr. Melvyn Novas; w/ liver involvement per biopsy 12/09, Reversible airway component so started on [facility] 01/2010; HFA 75% p coaching 05/2010  . Seborrheic dermatitis of scalp   . Sleep apnea   . Tobacco abuse    Review of Systems:  Performed and all others negative.  Physical Exam:  Vitals:   01/17/18 1316  BP: (!) 141/88  Pulse: 100  Temp: 98.4 F (36.9 C)  TempSrc: Oral  SpO2: 94%  Weight: 216 lb 1.6 oz (98 kg)  Height: 5\' 1"  (1.549 m)   Physical Exam  Constitutional: She appears well-developed and well-nourished. No distress.  Transported in wheelchair. Trach in place  Cardiovascular: Normal rate, regular rhythm, normal heart sounds and intact distal pulses.  Pulmonary/Chest: Effort normal and breath sounds normal. No respiratory distress.  Abdominal: Soft. Bowel sounds are normal. She exhibits no distension.  Musculoskeletal: She exhibits no edema or deformity.  Skin: Skin is warm and dry.     Assessment & Plan:   See Encounters Tab for problem based charting.  Patient discussed with Dr. Beryle Beams

## 2018-01-18 LAB — HEPATITIS C ANTIBODY: Hep C Virus Ab: <0.1 s/co ratio (ref 0.0–0.9)

## 2018-01-18 LAB — CMP14 + ANION GAP
ALT: 24 IU/L (ref 0–32)
AST: 21 IU/L (ref 0–40)
Albumin/Globulin Ratio: 1.2 (ref 1.2–2.2)
Albumin: 3.7 g/dL (ref 3.5–5.5)
Alkaline Phosphatase: 157 IU/L — ABNORMAL HIGH (ref 39–117)
Anion Gap: 10 mmol/L (ref 10.0–18.0)
BUN/Creatinine Ratio: 12 (ref 9–23)
BUN: 28 mg/dL — ABNORMAL HIGH (ref 6–24)
Bilirubin Total: 0.2 mg/dL (ref 0.0–1.2)
CO2: 21 mmol/L (ref 20–29)
Calcium: 9 mg/dL (ref 8.7–10.2)
Chloride: 110 mmol/L — ABNORMAL HIGH (ref 96–106)
Creatinine, Ser: 2.39 mg/dL — ABNORMAL HIGH (ref 0.57–1.00)
GFR calc Af Amer: 27 mL/min/{1.73_m2} — ABNORMAL LOW (ref >59)
GFR calc non Af Amer: 23 mL/min/{1.73_m2} — ABNORMAL LOW (ref >59)
Globulin, Total: 3 g/dL (ref 1.5–4.5)
Glucose: 168 mg/dL — ABNORMAL HIGH (ref 65–99)
Potassium: 5.3 mmol/L — ABNORMAL HIGH (ref 3.5–5.2)
Sodium: 141 mmol/L (ref 134–144)
Total Protein: 6.7 g/dL (ref 6.0–8.5)

## 2018-01-18 LAB — HEPATITIS A ANTIBODY, TOTAL: Hep A Total Ab: NEGATIVE

## 2018-01-18 LAB — HEPATITIS B SURFACE ANTIBODY,QUALITATIVE: Hep B Surface Ab, Qual: NONREACTIVE

## 2018-01-18 LAB — HEPATITIS B SURFACE ANTIGEN: Hepatitis B Surface Ag: NEGATIVE

## 2018-01-18 LAB — HEPATITIS B CORE ANTIBODY, TOTAL: Hep B Core Total Ab: NEGATIVE

## 2018-01-18 NOTE — Assessment & Plan Note (Addendum)
BP today 141/88. This is improved from her previous of 800 Systolic. She has been taking her Coreg 6.35 BID but had run out of her lisinopril for 3 months. Lisinopril was increased along with coreg at a prior visit; however, given patient is close to goal off of lisinopril, will start this back at 10mg . - Restart Lisinopril 10mg  Daily - Continue Coreg 6.25mg  BID  ADDENDUM: Cr noted to be elevated on Labs attempted to contact patient to inform her to not take lisinopril and follow up in clinic, will call patient again.

## 2018-01-18 NOTE — Assessment & Plan Note (Signed)
Patient has a history of Hyperlipidemia, but has been of her statin for some time after her last refills ran out. Agreeable to restarting atorvastatin today. - Atorvastatin 40mg  Daily

## 2018-01-18 NOTE — Assessment & Plan Note (Signed)
A1c today 7.8; this is improved/stable from previous of 8.1. Given we are making other medication changes and adjustments today and patients A1c remains stable; will defer changes to follow up visit. At that time will consider adding SGL2-inhibitor. She is also requesting test strips but cannot remember her brand, she states she will call in her request. - Continue Metformin 1000mg  BID - Continue Levemir 46U Daily - Continue Victoza 1.8mg  Daily - Return in 3 months - Patient plans to have eye exam done in clinic at her next visit - Refill test strips when patient calls in with brand

## 2018-01-18 NOTE — Assessment & Plan Note (Addendum)
Patient has a history of CKD III. Recent Cr during workup for possible gall bladder surgery was a bit above her baseline at 1.7. Will be obtaining CMP today and will evaluate current renal function - CMP  ADDENDUM: CMP showed elevated Cr to 2.39 from 1.72, attempted to contact patient to follow up for repeat labs and discontinuation, but did not receive an answer, will try again.

## 2018-01-20 ENCOUNTER — Encounter: Payer: Self-pay | Admitting: Internal Medicine

## 2018-01-20 NOTE — Assessment & Plan Note (Signed)
Patient with signs cirrhosis noted in the past. Will begin work up for this beginning with preliminary labs. Will be checking hepatitis and CMP. - Patient negative for Hep A, B, or C - Non-immune to Hep B, will need immunization - CMP shows normal AST/ALT with persistent mild elevation of ALP to 150s

## 2018-01-21 ENCOUNTER — Telehealth: Payer: Self-pay | Admitting: Internal Medicine

## 2018-01-21 ENCOUNTER — Encounter: Payer: Self-pay | Admitting: Internal Medicine

## 2018-01-21 ENCOUNTER — Encounter: Payer: Medicare Other | Admitting: Internal Medicine

## 2018-01-21 NOTE — Telephone Encounter (Signed)
I was able to reach patient with most recent call.  She was told of her worsening kidney function and need to be seen today.  She is unsure if she will be able to get transportation to be seen today. We may be able to help her with this. Patient is to call the clinic and may be called by [name] as I have messaged clinic regarding her need to be seen.  Pearson Grippe, DO IM PGY-2

## 2018-01-21 NOTE — Progress Notes (Signed)
Medicine attending: Medical history, presenting problems, physical findings, and medications, reviewed with resident physician Dr Neva Seat on the day of the patient visit and I concur with his evaluation and management plan.

## 2018-01-21 NOTE — Telephone Encounter (Signed)
Patient has transportation for tomorrow. Verified with Attending appt  for tomorrow is acceptable. Given appt for tomorrow in [facility] at [place]. Hubbard Hartshorn, RN, BSN

## 2018-01-21 NOTE — Progress Notes (Signed)
Patient mailed letter informing her of her negative hepatitis screening. She was also informed that she would need vaccination against Hep B.

## 2018-01-21 NOTE — Telephone Encounter (Signed)
Attempted again to contact patient regarding recent lab results. Have sent My Chart message as well.  Pearson Grippe, DO IM PGY-2

## 2018-01-22 ENCOUNTER — Other Ambulatory Visit: Payer: Self-pay

## 2018-01-22 ENCOUNTER — Ambulatory Visit (INDEPENDENT_AMBULATORY_CARE_PROVIDER_SITE_OTHER): Payer: Medicare Other | Admitting: Internal Medicine

## 2018-01-22 ENCOUNTER — Ambulatory Visit: Payer: Medicare Other

## 2018-01-22 ENCOUNTER — Encounter: Payer: Self-pay | Admitting: Internal Medicine

## 2018-01-22 VITALS — BP 165/90 | HR 89 | Temp 98.6°F | Ht 61.0 in | Wt 217.5 lb

## 2018-01-22 DIAGNOSIS — Z720 Tobacco use: Secondary | ICD-10-CM

## 2018-01-22 DIAGNOSIS — Z930 Tracheostomy status: Secondary | ICD-10-CM

## 2018-01-22 DIAGNOSIS — N182 Chronic kidney disease, stage 2 (mild): Secondary | ICD-10-CM | POA: Diagnosis not present

## 2018-01-22 DIAGNOSIS — Z794 Long term (current) use of insulin: Secondary | ICD-10-CM | POA: Diagnosis not present

## 2018-01-22 DIAGNOSIS — I428 Other cardiomyopathies: Secondary | ICD-10-CM | POA: Diagnosis not present

## 2018-01-22 DIAGNOSIS — N2889 Other specified disorders of kidney and ureter: Secondary | ICD-10-CM | POA: Diagnosis not present

## 2018-01-22 DIAGNOSIS — I10 Essential (primary) hypertension: Secondary | ICD-10-CM

## 2018-01-22 DIAGNOSIS — Z114 Encounter for screening for human immunodeficiency virus [HIV]: Secondary | ICD-10-CM | POA: Diagnosis not present

## 2018-01-22 DIAGNOSIS — Z79899 Other long term (current) drug therapy: Secondary | ICD-10-CM | POA: Diagnosis not present

## 2018-01-22 DIAGNOSIS — E1122 Type 2 diabetes mellitus with diabetic chronic kidney disease: Secondary | ICD-10-CM

## 2018-01-22 DIAGNOSIS — N179 Acute kidney failure, unspecified: Secondary | ICD-10-CM

## 2018-01-22 DIAGNOSIS — J9610 Chronic respiratory failure, unspecified whether with hypoxia or hypercapnia: Secondary | ICD-10-CM | POA: Diagnosis not present

## 2018-01-22 DIAGNOSIS — D8689 Sarcoidosis of other sites: Secondary | ICD-10-CM | POA: Diagnosis not present

## 2018-01-22 DIAGNOSIS — I129 Hypertensive chronic kidney disease with stage 1 through stage 4 chronic kidney disease, or unspecified chronic kidney disease: Secondary | ICD-10-CM | POA: Diagnosis not present

## 2018-01-22 DIAGNOSIS — E113299 Type 2 diabetes mellitus with mild nonproliferative diabetic retinopathy without macular edema, unspecified eye: Secondary | ICD-10-CM

## 2018-01-22 MED ORDER — AMLODIPINE BESYLATE 10 MG PO TABS: 10.0000 mg | ORAL_TABLET | Freq: Every day | ORAL | 11 refills | Status: AC

## 2018-01-22 MED ORDER — INSULIN DETEMIR 100 UNIT/ML ~~LOC~~ SOLN
SUBCUTANEOUS | 0 refills | Status: DC
Start: 2018-01-22 — End: 2018-04-15

## 2018-01-22 NOTE — Assessment & Plan Note (Signed)
Due to AKI, we are stopping lisinopril for the time being until further workup can be obtained. For her BP she will continue coreg; amlodipine 10mg  daily will be started today.  Patient will f/u in 2 wks for BP check.

## 2018-01-22 NOTE — Progress Notes (Signed)
CC: AKI  HPI:  Melody Gray is a 46 y.o. with a PMH of T2DM, CKD, HTN, pulmonary and hepatic sarcoid, chronic ventilatory failure s/p trach presenting to clinic for follow up on AKI.   Patient was seen in clinic by PCP a few days ago and had f/u on slight rise in her Cr one labs in February obtained from different provider. Cr at that time was 1.7 and last week on recheck it was 2.4.   Patient states that other than prescribed medications, she takes OTC daily multivitamin, daily Echinacea supplement, and an ibuprofen every few weeks. She is a current smoker, with ~10 pack year history, denies EtOH intake and denies illicit drug. She reports father had multiple sclerosis but otherwise no other family members with known cancer or autoimmune disease.  She denies hematuria, frothy urine, difficulty urinating, dysuria, suprapubic pressure; she denies LE swelling, chest pain, shortness of breath, rash, headaches, vision or hearing changes, nausea, vomiting, poor appetite, dizziness.   Previous notable history includes NICM with reduced EF, liver biopsy consistent with sarcoidosis; microalb/cr in 2018 of >4K; very recent HCV ab negative, HBV antibody negative.  On CT abd/pel wo contrast in May 2019, incidental findings of 2.9cm rounded low density lesion was seen on the left kidney without other urologic findings. Her PCP had ordered a RUS to further evaluate however this has not been done yet.  Please see problem based Assessment and Plan for status of patients chronic conditions.  Past Medical History:  Diagnosis Date  . Abnormal LFTs (liver function tests)    Liver U/S and exam c/w HSM. Hep B serology neg. but Hep C ab +, HIV neg. 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 Acute bronchitis 07/29/2014  . Anemia   . Cardiomyopathy, nonischemic ([place])    a. Varying EF over the years - initially 35% in 09/2010. Normal cors 12/2010. b.  Echo 07/2014: EF 55-60%, no RWMA, + diastolic flattening and systolic flattening c/w RV volume and pressure overload, mild LAE/RAE, mod dilated RV, mod TR, PASP severely increased at 12mmHg. c. RHC 07/2014: moderate pulmonary HTN likely WHO group 3 with markedly elevated CVP and relatively normal left sided pressures.  . Chronic diastolic CHF (congestive heart failure) ([place])   . Chronic respiratory failure ([place])    a. became O2 dependent in July 2011. b. She required trache placement in 07/2013 and has been followed by pulmonology as well.  . CKD (chronic kidney disease), stage II   . Complication of anesthesia    " difficult waking "  . Diabetes mellitus    insulin dependent  . Diabetic retinopathy    Right eye 2/11  . Essential hypertension   . Health maintenance examination    Mammogram 05/2010 Negative; Last Pap smear 03/2008; Last DM eye exam 2/11> mild non-proliferative diabetic retinopathy. OD  . Helicobacter pylori ab+ 05/2011   Pt was symptomatic and treatment planned for 05/2011  . Hx of cardiac cath 2/08   No CAD, no RAS,  normal EF  . Hypokalemia   . Hypomagnesemia   . Hypoxemia    CT angiogram 9/11>> No PE; PFTs 10/11- FEV1 1.20 (49%) with 16% better p B2, DLCO 33%> corrects to 84; O2 sats ok on 4 lpm X rapid walk X 3 laps 05/2010  . Long term current use of systemic steroids   . Morbid obesity ([place])    Target wt= 153 for  BMI <30  . Obesity hypoventilation syndrome ([place])   . Pulmonary hypertension ([place])   . QT prolongation   . Sarcoidosis    Followed by Dr. Melvyn Novas; w/ liver involvement per biopsy 12/09, Reversible airway component so started on [facility] 01/2010; HFA 75% p coaching 05/2010  . Seborrheic dermatitis of scalp   . Sleep apnea   . Tobacco abuse     Review of Systems:   Per HPI  Physical Exam:  Vitals:   01/22/18 0913  BP: (!) 165/90  Pulse: 89  Temp: 98.6 F (37 C)  TempSrc: Oral  SpO2: 97%  Weight: 217 lb 8 oz (98.7 kg)  Height: 5\' 1"  (1.549 m)    GENERAL- alert, co-operative, appears as stated age, not in any distress. HEENT- oral mucosa appears moist, trach in place. CARDIAC- RRR, no murmurs, rubs or gallops. RESP- Moving equal volumes of air, and clear to auscultation bilaterally, no wheezes or crackles. ABDOMEN- Soft, nontender, bowel sounds present. NEURO- CN 2-12 grossly intact. EXTREMITIES- pulse 2+ DP/PT, symmetric, no pedal edema. SKIN- Warm, dry, no rash or lesion. PSYCH- Normal mood and affect, appropriate thought content and speech.  Assessment & Plan:   See Encounters Tab for problem based charting.   Patient discussed with Dr. Moshe Cipro, MD Internal Medicine PGY-3

## 2018-01-22 NOTE — Assessment & Plan Note (Signed)
Patient with significant increase in Cr to 2.4 from 1.7 a few months ago. She has T2DM and HTN with underlying CKD so etiology could be progressive nephropathy from these chronic diseases. Other etiologies could be sarcoid induced glomerulonephritis, autoimmune disease.   Plan: --stop lisinopril and metformin for now --advised against nsaid use --repeat Bmet today --f/u UA with microscopy to eval for hematuria --f/u spot urine pr/cr ratio --f/u HIV ab, dsDNA, C3, C4, ANA --she had recent HCV and HBV screens which were negative --RUS scheduled today --refer to nephrology

## 2018-01-22 NOTE — Patient Instructions (Addendum)
We'll check some blood work today; expect a call from me in the next day or two with the results.  For your diabetes, stop metformin for now.  For your blood pressure, stop lisinopril for now; start amlodipine 10mg  daily. Come back in 2 wks for a recheck.

## 2018-01-22 NOTE — Assessment & Plan Note (Signed)
Stopping metformin in setting of AKI. She will return in 2 wks for re-evaluation. Continue levemir 46 units at night and victoza 1.8mg  daily. If renal dysfunction does not improve, she may need to be started on mealtime insulin for better glycemic control.

## 2018-01-22 NOTE — Progress Notes (Signed)
Internal Medicine Clinic Attending  Case discussed with Dr. Jari Favre at the time of the visit.  We reviewed the resident's history and exam and pertinent patient test results.  I agree with the assessment, diagnosis, and plan of care documented in the resident's note.  Here with worsening renal function, likely progression of nephropathy due to DM or HTN, but will work up for other causes, evaluate for proteinuria or hematuria. Also, given sarcoidosis with possibility of renal involvement, will refer to nephrology for consideration of kidney biopsy. Will hold lisinopril for now pending further evaluation, may restart depending on findings of work up and nephrology evaluation.  Lenice Pressman, M.D., Ph.D.

## 2018-01-23 LAB — URINALYSIS, COMPLETE
Bilirubin, UA: NEGATIVE
Ketones, UA: NEGATIVE
Leukocytes, UA: NEGATIVE
Nitrite, UA: NEGATIVE
Specific Gravity, UA: 1.013 (ref 1.005–1.030)
Urobilinogen, Ur: 0.2 mg/dL (ref 0.2–1.0)
pH, UA: 5.5 (ref 5.0–7.5)

## 2018-01-23 LAB — SPECIMEN STATUS REPORT

## 2018-01-23 LAB — BMP8+ANION GAP
Anion Gap: 13 mmol/L (ref 10.0–18.0)
BUN/Creatinine Ratio: 15 (ref 9–23)
BUN: 35 mg/dL — ABNORMAL HIGH (ref 6–24)
CO2: 21 mmol/L (ref 20–29)
Calcium: 9 mg/dL (ref 8.7–10.2)
Chloride: 108 mmol/L — ABNORMAL HIGH (ref 96–106)
Creatinine, Ser: 2.33 mg/dL — ABNORMAL HIGH (ref 0.57–1.00)
GFR calc Af Amer: 27 mL/min/{1.73_m2} — ABNORMAL LOW (ref >59)
GFR calc non Af Amer: 24 mL/min/{1.73_m2} — ABNORMAL LOW (ref >59)
Glucose: 108 mg/dL — ABNORMAL HIGH (ref 65–99)
Potassium: 4.9 mmol/L (ref 3.5–5.2)
Sodium: 142 mmol/L (ref 134–144)

## 2018-01-23 LAB — MICROSCOPIC EXAMINATION: Casts: NONE SEEN /lpf

## 2018-01-23 LAB — ANTINUCLEAR ANTIBODIES, IFA: ANA Titer 1: NEGATIVE

## 2018-01-23 LAB — C3 AND C4
Complement C3, Serum: 135 mg/dL (ref 82–167)
Complement C4, Serum: 55 mg/dL — ABNORMAL HIGH (ref 14–44)

## 2018-01-23 LAB — ANTI-DNA ANTIBODY, DOUBLE-STRANDED: dsDNA Ab: <1 IU/mL (ref 0–9)

## 2018-01-23 LAB — HIV ANTIBODY (ROUTINE TESTING W REFLEX): HIV Screen 4th Generation wRfx: NONREACTIVE

## 2018-01-23 NOTE — Addendum Note (Signed)
Addended by: Alphonzo Grieve on: 01/23/2018 08:59 AM   Modules accepted: Orders

## 2018-01-24 ENCOUNTER — Ambulatory Visit (HOSPITAL_COMMUNITY): Payer: Medicare Other

## 2018-01-24 LAB — PROTEIN / CREATININE RATIO, URINE
Creatinine, Urine: 80 mg/dL
Protein, Ur: 507.7 mg/dL
Protein/Creat Ratio: 6346 mg/g creat — ABNORMAL HIGH (ref 0–200)

## 2018-01-24 LAB — SPECIMEN STATUS REPORT

## 2018-01-31 ENCOUNTER — Ambulatory Visit (HOSPITAL_COMMUNITY)
Admission: RE | Admit: 2018-01-31 | Discharge: 2018-01-31 | Disposition: A | Discharge status: Home / Self Care | Payer: Medicare Other | Source: Ambulatory Visit | Attending: Oncology | Admitting: Oncology

## 2018-01-31 DIAGNOSIS — N281 Cyst of kidney, acquired: Secondary | ICD-10-CM | POA: Insufficient documentation

## 2018-01-31 DIAGNOSIS — N2889 Other specified disorders of kidney and ureter: Secondary | ICD-10-CM | POA: Insufficient documentation

## 2018-02-04 ENCOUNTER — Ambulatory Visit: Payer: Medicare Other | Admitting: Dietician

## 2018-02-04 ENCOUNTER — Other Ambulatory Visit: Payer: Self-pay

## 2018-02-04 ENCOUNTER — Encounter: Payer: Self-pay | Admitting: Dietician

## 2018-02-04 ENCOUNTER — Encounter: Payer: Self-pay | Admitting: Internal Medicine

## 2018-02-04 ENCOUNTER — Ambulatory Visit (INDEPENDENT_AMBULATORY_CARE_PROVIDER_SITE_OTHER): Payer: Medicare Other | Admitting: Internal Medicine

## 2018-02-04 ENCOUNTER — Other Ambulatory Visit: Payer: Self-pay | Admitting: Dietician

## 2018-02-04 VITALS — BP 135/73 | HR 102 | Temp 98.1°F | Ht 61.0 in | Wt 219.1 lb

## 2018-02-04 DIAGNOSIS — N179 Acute kidney failure, unspecified: Secondary | ICD-10-CM

## 2018-02-04 DIAGNOSIS — N281 Cyst of kidney, acquired: Secondary | ICD-10-CM | POA: Diagnosis not present

## 2018-02-04 DIAGNOSIS — N189 Chronic kidney disease, unspecified: Secondary | ICD-10-CM

## 2018-02-04 DIAGNOSIS — J9610 Chronic respiratory failure, unspecified whether with hypoxia or hypercapnia: Secondary | ICD-10-CM

## 2018-02-04 DIAGNOSIS — Z79899 Other long term (current) drug therapy: Secondary | ICD-10-CM

## 2018-02-04 DIAGNOSIS — Z930 Tracheostomy status: Secondary | ICD-10-CM

## 2018-02-04 DIAGNOSIS — E113299 Type 2 diabetes mellitus with mild nonproliferative diabetic retinopathy without macular edema, unspecified eye: Secondary | ICD-10-CM

## 2018-02-04 DIAGNOSIS — Z794 Long term (current) use of insulin: Secondary | ICD-10-CM | POA: Diagnosis not present

## 2018-02-04 DIAGNOSIS — D8689 Sarcoidosis of other sites: Secondary | ICD-10-CM | POA: Diagnosis not present

## 2018-02-04 DIAGNOSIS — E113293 Type 2 diabetes mellitus with mild nonproliferative diabetic retinopathy without macular edema, bilateral: Secondary | ICD-10-CM

## 2018-02-04 DIAGNOSIS — E1122 Type 2 diabetes mellitus with diabetic chronic kidney disease: Secondary | ICD-10-CM | POA: Diagnosis not present

## 2018-02-04 DIAGNOSIS — F17210 Nicotine dependence, cigarettes, uncomplicated: Secondary | ICD-10-CM

## 2018-02-04 DIAGNOSIS — I129 Hypertensive chronic kidney disease with stage 1 through stage 4 chronic kidney disease, or unspecified chronic kidney disease: Secondary | ICD-10-CM | POA: Diagnosis not present

## 2018-02-04 DIAGNOSIS — I10 Essential (primary) hypertension: Secondary | ICD-10-CM

## 2018-02-04 LAB — HM DIABETES EYE EXAM

## 2018-02-04 NOTE — Progress Notes (Signed)
Retinal images were done and transmitted today. Debera Lat, RD 02/04/2018 11:46 AM.

## 2018-02-04 NOTE — Progress Notes (Signed)
CC: AKI on CKD  HPI:  [name] is a 47 y.o. woman with PMHx of T2DM, CKD, HTN pulmonary and hepatic sarcoid, and chronic respiratory failure s/p trach who presents for follow-up of worsening renal function. Her Metformin and Lisinopril were held at last visit pending further evaluation. Patient denies hematuria, difficulty urinating, dysuria, suprapubic or flank pain.  Overall she is feeling pretty well. Denies headaches, vision changes, chest pain, dyspnea, n/v, or decreased appetite.    Past Medical History:  Diagnosis Date  . Abnormal LFTs (liver function tests)    Liver U/S and exam c/w HSM. Hep B serology neg. but Hep C ab +, HIV neg. 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 Acute bronchitis 07/29/2014  . Anemia   . Cardiomyopathy, nonischemic ([place])    a. Varying EF over the years - initially 35% in 09/2010. Normal cors 12/2010. b. Echo 07/2014: EF 55-60%, no RWMA, + diastolic flattening and systolic flattening c/w RV volume and pressure overload, mild LAE/RAE, mod dilated RV, mod TR, PASP severely increased at 38mmHg. c. RHC 07/2014: moderate pulmonary HTN likely WHO group 3 with markedly elevated CVP and relatively normal left sided pressures.  . Chronic diastolic CHF (congestive heart failure) ([place])   . Chronic respiratory failure ([place])    a. became O2 dependent in July 2011. b. She required trache placement in 07/2013 and has been followed by pulmonology as well.  . CKD (chronic kidney disease), stage II   . Complication of anesthesia    " difficult waking "  . Diabetes mellitus    insulin dependent  . Diabetic retinopathy    Right eye 2/11  . Essential hypertension   . Health maintenance examination    Mammogram 05/2010 Negative; Last Pap smear 03/2008; Last DM eye exam 2/11> mild non-proliferative diabetic retinopathy. OD  . Helicobacter pylori ab+ 05/2011   Pt was symptomatic and treatment planned for 05/2011  .  Hx of cardiac cath 2/08   No CAD, no RAS,  normal EF  . Hypokalemia   . Hypomagnesemia   . Hypoxemia    CT angiogram 9/11>> No PE; PFTs 10/11- FEV1 1.20 (49%) with 16% better p B2, DLCO 33%> corrects to 84; O2 sats ok on 4 lpm X rapid walk X 3 laps 05/2010  . Long term current use of systemic steroids   . Morbid obesity ([place])    Target wt= 153 for BMI <30  . Obesity hypoventilation syndrome ([place])   . Pulmonary hypertension ([place])   . QT prolongation   . Sarcoidosis    Followed by Dr. Melvyn Novas; w/ liver involvement per biopsy 12/09, Reversible airway component so started on [facility] 01/2010; HFA 75% p coaching 05/2010  . Seborrheic dermatitis of scalp   . Sleep apnea   . Tobacco abuse      Physical Exam:  Vitals:   02/04/18 1034  BP: 135/73  Pulse: (!) 102  Temp: 98.1 F (36.7 C)  TempSrc: Oral  SpO2: 94%  Weight: 219 lb 1.6 oz (99.4 kg)  Height: 5\' 1"  (1.549 m)   General: alert, cooperative, appears sated age, NAD Neck: trach in place Cardiac: RRR; no murmurs, rubs or gallops Pulm: normal respiratory effort; lungs CTA bilaterally without wheezes or crackles Ext: distal pulses intact, no lower extremity edema  Psych: normal mood and affect   Assessment & Plan:   See Encounters Tab for problem based charting.  Patient seen  with Dr. Dareen Piano

## 2018-02-04 NOTE — Assessment & Plan Note (Deleted)
Continue holding Metformin until she undergoes further evaluation for worsening renal function by nephrology.

## 2018-02-04 NOTE — Patient Instructions (Addendum)
Ms. Mandelbaum,  It was a pleasure meeting you! Continue holding your Lisinopril and Metformin until we evaluate how your kidneys are doing. I have placed referral to nephrologist, as well as order for MRI to get a better look at the renal cyst they saw on ultrasound.  Continue taking your other medications as prescribed. I will call and let you know the results of your lab work.   Take care, and call if you have any questions or concerns!  We will plan to see you back in 2 weeks for follow-up.

## 2018-02-04 NOTE — Assessment & Plan Note (Signed)
Repeating BMP today to see if renal function has improved in the setting of holding Lisinopril. Renal u/s showed left complex renal cyst with recommendation of MRI for further evaluation. Have placed order for this. Other work-up from last visit significant for nephrotic range proteinuria on spot UPC. Etiology likely secondary to worsening nephropathy secondary to DM. Placed referral to nephrology. Will continue holding Lisinopril until she has been evaluated by them.

## 2018-02-04 NOTE — Assessment & Plan Note (Signed)
Blood pressure at goal. Amlodipine started at last visit given discontinuation of Lisinopril. Patient is tolerating well. Will continue Coreg 6.25 mg and Amlodipine 10 mg.

## 2018-02-04 NOTE — Assessment & Plan Note (Signed)
Continue holding Metformin pending evaluation by nephrology for worsening renal function. Continue Levemir 46 units qhs and Victoza 1.8 mg daily.

## 2018-02-05 ENCOUNTER — Telehealth: Payer: Self-pay | Admitting: Internal Medicine

## 2018-02-05 LAB — BMP8+ANION GAP
Anion Gap: 13 mmol/L (ref 10.0–18.0)
BUN/Creatinine Ratio: 12 (ref 9–23)
BUN: 31 mg/dL — ABNORMAL HIGH (ref 6–24)
CO2: 22 mmol/L (ref 20–29)
Calcium: 8.9 mg/dL (ref 8.7–10.2)
Chloride: 105 mmol/L (ref 96–106)
Creatinine, Ser: 2.69 mg/dL — ABNORMAL HIGH (ref 0.57–1.00)
GFR calc Af Amer: 23 mL/min/{1.73_m2} — ABNORMAL LOW (ref >59)
GFR calc non Af Amer: 20 mL/min/{1.73_m2} — ABNORMAL LOW (ref >59)
Glucose: 158 mg/dL — ABNORMAL HIGH (ref 65–99)
Potassium: 5.3 mmol/L — ABNORMAL HIGH (ref 3.5–5.2)
Sodium: 140 mmol/L (ref 134–144)

## 2018-02-05 NOTE — Telephone Encounter (Signed)
Called patient to inform her of lab results from yesterday. Crt has increaesed to 2.69. Will continue holding Lisinopril and Metformin and follow-up with nephrology recs. Referral currently being processed and should have an appointment soon.

## 2018-02-06 NOTE — Progress Notes (Signed)
Internal Medicine Clinic Attending  I saw and evaluated the patient.  I personally confirmed the key portions of the history and exam documented by Dr. Bloomfield and I reviewed pertinent patient test results.  The assessment, diagnosis, and plan were formulated together and I agree with the documentation in the resident's note.  

## 2018-02-06 NOTE — Addendum Note (Signed)
Addended by: Aldine Contes on: 02/06/2018 06:31 PM   Modules accepted: Level of Service

## 2018-02-14 DIAGNOSIS — N281 Cyst of kidney, acquired: Secondary | ICD-10-CM | POA: Diagnosis not present

## 2018-02-14 DIAGNOSIS — Z720 Tobacco use: Secondary | ICD-10-CM | POA: Diagnosis not present

## 2018-02-14 DIAGNOSIS — N184 Chronic kidney disease, stage 4 (severe): Secondary | ICD-10-CM | POA: Diagnosis not present

## 2018-02-14 DIAGNOSIS — E1122 Type 2 diabetes mellitus with diabetic chronic kidney disease: Secondary | ICD-10-CM | POA: Diagnosis not present

## 2018-02-14 DIAGNOSIS — R809 Proteinuria, unspecified: Secondary | ICD-10-CM | POA: Diagnosis not present

## 2018-02-14 DIAGNOSIS — R319 Hematuria, unspecified: Secondary | ICD-10-CM | POA: Diagnosis not present

## 2018-02-14 DIAGNOSIS — N189 Chronic kidney disease, unspecified: Secondary | ICD-10-CM | POA: Diagnosis not present

## 2018-02-14 DIAGNOSIS — D631 Anemia in chronic kidney disease: Secondary | ICD-10-CM | POA: Diagnosis not present

## 2018-02-20 ENCOUNTER — Encounter: Payer: Self-pay | Admitting: Dietician

## 2018-02-22 ENCOUNTER — Telehealth: Payer: Self-pay | Admitting: Internal Medicine

## 2018-02-22 DIAGNOSIS — E113213 Type 2 diabetes mellitus with mild nonproliferative diabetic retinopathy with macular edema, bilateral: Secondary | ICD-10-CM

## 2018-02-22 DIAGNOSIS — E113299 Type 2 diabetes mellitus with mild nonproliferative diabetic retinopathy without macular edema, unspecified eye: Secondary | ICD-10-CM

## 2018-02-22 DIAGNOSIS — Z794 Long term (current) use of insulin: Secondary | ICD-10-CM

## 2018-02-22 NOTE — Telephone Encounter (Signed)
-----   Message from Hulan Fray, RN sent at 02/20/2018 12:19 PM EDT ----- Regarding: eye exam results from [facility] retinal scanner Hi, Dr. Marin Comment completed the retinal scan on Ms Fason and the [facility] eye dr, "read" her photos and indicated Moderate NPDR w/ CSME of the OS but NDR OD - can you please put in a referral for pt to ophthalmology to f/u on her OS findings? I am copying Lela bc she will be making the referral appt once you put in the referral order. Thanks, Yvonna Alanis

## 2018-02-22 NOTE — Telephone Encounter (Signed)
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 Referral to ophthalmologist is indicated and will be ordered. Patient called and informed of these results - Referral to ophthalmology  While speaking with the patient she states her stomach seemed to getting upset when taking Coreg, Atorvastatin, and Amlodipine in the morning. She has already tried taking the Atorvastatin at night instead, which she states has improved her symptoms. I agree with this change and we will continue to monitor.  Pearson Grippe, DO IM PGY-2

## 2018-02-28 ENCOUNTER — Encounter: Payer: Medicare Other | Admitting: Internal Medicine

## 2018-02-28 ENCOUNTER — Encounter: Payer: Self-pay | Admitting: Internal Medicine

## 2018-02-28 NOTE — Assessment & Plan Note (Deleted)
Last A1c on 01/17/18 was stable at 7.8. Since that time metformin has been discontinued due to reduced GFR and with GFR <30 SGLT-2 are also contraindicated. ----- May be able to tolerate Januvia 25mg  based on renal function -------- ---- - Continue Levemir 46U Daily - Continue Victoza 1.8mg  Daily - Return in 3 months - Patient plans to have eye exam done in clinic at her next visit - Refill test strips when patient calls in with brand

## 2018-02-28 NOTE — Progress Notes (Deleted)
   CC: ***  HPI:  [name] is a 47 y.o.   Past Medical History:  Diagnosis Date  . Abnormal LFTs (liver function tests)    Liver U/S and exam c/w HSM. Hep B serology neg. but Hep C ab +, HIV neg. 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 Acute bronchitis 07/29/2014  . Anemia   . Cardiomyopathy, nonischemic ([place])    a. Varying EF over the years - initially 35% in 09/2010. Normal cors 12/2010. b. Echo 07/2014: EF 55-60%, no RWMA, + diastolic flattening and systolic flattening c/w RV volume and pressure overload, mild LAE/RAE, mod dilated RV, mod TR, PASP severely increased at 26mmHg. c. RHC 07/2014: moderate pulmonary HTN likely WHO group 3 with markedly elevated CVP and relatively normal left sided pressures.  . Chronic diastolic CHF (congestive heart failure) ([place])   . Chronic respiratory failure ([place])    a. became O2 dependent in July 2011. b. She required trache placement in 07/2013 and has been followed by pulmonology as well.  . CKD (chronic kidney disease), stage II   . Complication of anesthesia    " difficult waking "  . Diabetes mellitus    insulin dependent  . Diabetic retinopathy    Right eye 2/11  . Essential hypertension   . Health maintenance examination    Mammogram 05/2010 Negative; Last Pap smear 03/2008; Last DM eye exam 2/11> mild non-proliferative diabetic retinopathy. OD  . Helicobacter pylori ab+ 05/2011   Pt was symptomatic and treatment planned for 05/2011  . Hx of cardiac cath 2/08   No CAD, no RAS,  normal EF  . Hypokalemia   . Hypomagnesemia   . Hypoxemia    CT angiogram 9/11>> No PE; PFTs 10/11- FEV1 1.20 (49%) with 16% better p B2, DLCO 33%> corrects to 84; O2 sats ok on 4 lpm X rapid walk X 3 laps 05/2010  . Long term current use of systemic steroids   . Morbid obesity ([place])    Target wt= 153 for BMI <30  . Obesity hypoventilation syndrome ([place])   . Pulmonary hypertension ([place])   . QT  prolongation   . Sarcoidosis    Followed by Dr. Melvyn Novas; w/ liver involvement per biopsy 12/09, Reversible airway component so started on [facility] 01/2010; HFA 75% p coaching 05/2010  . Seborrheic dermatitis of scalp   . Sleep apnea   . Tobacco abuse    Review of Systems:  ***  Physical Exam:  There were no vitals filed for this visit. ***  Assessment & Plan:   See Encounters Tab for problem based charting.  Patient {GC/GE:[id number]::"discussed with","seen with"} Dr. {NAMES:[id number]::"Butcher","Granfortuna","E. Hoffman","Klima","Mullen","Narendra","Raines","Vincent"}

## 2018-02-28 NOTE — Assessment & Plan Note (Deleted)
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 - Patient has referral to ophthalmology in process

## 2018-03-11 ENCOUNTER — Ambulatory Visit (HOSPITAL_COMMUNITY): Admission: RE | Admit: 2018-03-11 | Payer: Medicare Other | Source: Ambulatory Visit

## 2018-03-11 DIAGNOSIS — H34832 Tributary (branch) retinal vein occlusion, left eye, with macular edema: Secondary | ICD-10-CM | POA: Diagnosis not present

## 2018-03-11 DIAGNOSIS — H2513 Age-related nuclear cataract, bilateral: Secondary | ICD-10-CM | POA: Diagnosis not present

## 2018-03-11 LAB — HM DIABETES EYE EXAM

## 2018-03-13 DIAGNOSIS — H35031 Hypertensive retinopathy, right eye: Secondary | ICD-10-CM | POA: Diagnosis not present

## 2018-03-13 DIAGNOSIS — H34832 Tributary (branch) retinal vein occlusion, left eye, with macular edema: Secondary | ICD-10-CM | POA: Diagnosis not present

## 2018-03-13 DIAGNOSIS — H3582 Retinal ischemia: Secondary | ICD-10-CM | POA: Diagnosis not present

## 2018-03-15 ENCOUNTER — Encounter: Payer: Self-pay | Admitting: *Deleted

## 2018-04-05 ENCOUNTER — Ambulatory Visit: Payer: Medicare Other | Admitting: Internal Medicine

## 2018-04-08 DIAGNOSIS — R311 Benign essential microscopic hematuria: Secondary | ICD-10-CM | POA: Diagnosis not present

## 2018-04-08 DIAGNOSIS — N281 Cyst of kidney, acquired: Secondary | ICD-10-CM | POA: Diagnosis not present

## 2018-04-09 ENCOUNTER — Other Ambulatory Visit: Payer: Self-pay | Admitting: Oncology

## 2018-04-09 NOTE — Telephone Encounter (Signed)
Thank you, this is being held due to worsened renal function if patient asks.  Melody Gray

## 2018-04-09 NOTE — Telephone Encounter (Signed)
Med not on current medication list; ? Discontinued on 9/24.

## 2018-04-11 ENCOUNTER — Other Ambulatory Visit: Payer: Self-pay | Admitting: *Deleted

## 2018-04-11 ENCOUNTER — Other Ambulatory Visit: Payer: Self-pay | Admitting: Acute Care

## 2018-04-11 ENCOUNTER — Ambulatory Visit (HOSPITAL_COMMUNITY)
Admission: RE | Admit: 2018-04-11 | Discharge: 2018-04-11 | Disposition: A | Discharge status: Home / Self Care | Payer: Medicare Other | Source: Ambulatory Visit | Attending: Acute Care | Admitting: Acute Care

## 2018-04-11 DIAGNOSIS — K219 Gastro-esophageal reflux disease without esophagitis: Secondary | ICD-10-CM

## 2018-04-11 DIAGNOSIS — Z930 Tracheostomy status: Secondary | ICD-10-CM | POA: Insufficient documentation

## 2018-04-11 DIAGNOSIS — J9611 Chronic respiratory failure with hypoxia: Secondary | ICD-10-CM

## 2018-04-11 MED ORDER — RANITIDINE HCL 150 MG PO TABS: 150.0000 mg | ORAL_TABLET | Freq: Two times a day (BID) | ORAL | 12 refills | Status: DC

## 2018-04-11 NOTE — Progress Notes (Signed)
Melody [name]   Reason for visit:  Routine trach care.  HPI:  This is a 47 year old female well known to me w/ history if chronic hypoxic respiratory failure d/t advanced sarcoidosis w/ obstructive lung disease as well as tracheostomy dependence. She has known  difficult airway and because of this we have opted to keep the trach indef. She presents today for routine trach change however also tells me that her shortness of breath has been worse. She has had trouble with oxygen compliance in the past because the portable oxygen tank was too heavy for her to cart around. We got her a more portable device but it is a pulsed device and she has had trouble with it working properly so has only been using her oxygen while at home when at rest.  Over the last few weeks she has noticed: worsening shortness of breath, not able to tolerate walking even small distances, occasional cough, no nasal congestion, sick exposure, fever or chills she has been constipated and had worsening bloating and occasional heart burn. On arrival to trach [name] her resting sats hover between 83-88%.   ROS  Review of Systems - History obtained from the patient General ROS: positive for  - fatigue and weight gain negative for - chills, fever, hot flashes, malaise, night sweats or weight loss Psychological ROS: positive for - anxiety ENT ROS: negative for - epistaxis, headaches, nasal congestion, nasal discharge, sinus pain, sneezing or sore throat Allergy and Immunology ROS: negative Hematological and Lymphatic ROS: negative Endocrine ROS: negative Respiratory ROS: positive for - cough, orthopnea, shortness of breath and wheezing negative for - hemoptysis, pleuritic pain, sputum changes, stridor or tachypnea Cardiovascular ROS: no chest pain or dyspnea on exertion Gastrointestinal ROS: positive for - abdominal pain, constipation, gas/bloating and heartburn negative for - appetite loss, blood in stools or  nausea/vomiting Genito-Urinary ROS: no dysuria, trouble voiding, or hematuria Musculoskeletal ROS: negative Neurological ROS: no TIA or stroke symptoms  Vital signs:  Reviewed  Exam:  Physical Exam Constitutional:      General: She is not in acute distress.    Appearance: Normal appearance. She is not ill-appearing, toxic-appearing or diaphoretic.  HENT:     Head: Normocephalic and atraumatic.     Nose: Nose normal. No congestion.     Mouth/Throat:     Mouth: Mucous membranes are moist.     Pharynx: Oropharynx is clear.  Eyes:     Extraocular Movements: Extraocular movements intact.     Pupils: Pupils are equal, round, and reactive to light.  Neck:     Musculoskeletal: Normal range of motion.     Comments: Trach stoma unremarkable  Cardiovascular:     Rate and Rhythm: Normal rate and regular rhythm.  Pulmonary:     Effort: Pulmonary effort is normal.     Breath sounds: Normal breath sounds.  Abdominal:     General: Abdomen is flat. Bowel sounds are normal.     Palpations: Abdomen is soft.  Musculoskeletal: Normal range of motion.  Skin:    General: Skin is warm and dry.     Capillary Refill: Capillary refill takes less than 2 seconds.  Neurological:     General: No focal deficit present.     Mental Status: She is alert and oriented to person, place, and time.  Psychiatric:        Mood and Affect: Mood normal.     Trach change/procedure: #4 Cuffless trach changed w/out difficulty  Impression/dx  Chronic Hypoxic respiratory failure  Sarcoidosis Tobacco abuse Trach dependence d/t difficult airway Severe sleep apnea  Worsening dyspnea   Discussion  Melody [name] is doing fine trach wise. Her bigger issue is her chronic hypoxic respiratory failure in general and her difficulty with using her home oxygen (specifically her ambulatory oxygen as it is heavy and she does not want to wear it). We have agreed now that she will wear this 24/7 Plan  Cont routine trach  care Will contact DME to ensure she has 1)Trach collar w/ humidifier and sterile water  Bleed in 3 liters at HS OR 30%  And 2) Oxygen portable 2 liters continuous; She has the pulse and this is not working   Visit time: 35 minutes.   [name] ACNP-BC [place]

## 2018-04-11 NOTE — Progress Notes (Signed)
Tracheostomy Procedure Note  Melody [name] [id number] 07/19/1970  Pre Procedure Tracheostomy Information  Trach Brand: Shiley Size: 4 Style: Uncuffed Secured by: Velcro   Procedure: trach change    Post Procedure Tracheostomy Information  Trach Brand: Shiley Size: 4 Style: Uncuffed Secured by: Velcro   Post Procedure Evaluation:  ETCO2 positive color change from yellow to purple : Yes.   Vital signs: pulse 93, respirations 18 and pulse oximetry 88% on RA with PMV Patients current condition: stable Complications: No apparent complications Trach site exam: clean, dry Wound care done: 4 x 4 gauze Patient did tolerate procedure well.   Education: Art therapist at night with humidity   See back in trach clinic in 3 months

## 2018-04-11 NOTE — Progress Notes (Signed)
Received an urgent staff message from [name], NP  [name], NP  P [facility] Triage Pool  Sender has requested a call back        Hey guys   Need this addressed urgently please   Her DME is [employer]   Needs the following:  Trach collar w/ humidifier and sterile water   Bleed in 3 liters at HS OR 30%  Oxygen portable 2 liters continuous   She has the pulse and this is not working   Please have them call me with questions   [phone]   Thanks   Melody [name]    Order has been placed. I sent a staff message back to Melody [name] letting him know that we needed the order to be cosigned by him so PCCs can take care of sending it to [facility] for pt.  Called and spoke with pt letting her know this had been done. Stated to her once the order had been cosigned by Melody [name] our PCCs would take care of sending this to [facility] for her. Pt expressed understanding. Nothing further needed.

## 2018-04-14 ENCOUNTER — Other Ambulatory Visit: Payer: Self-pay | Admitting: Internal Medicine

## 2018-04-14 DIAGNOSIS — Z794 Long term (current) use of insulin: Principal | ICD-10-CM

## 2018-04-14 DIAGNOSIS — E113299 Type 2 diabetes mellitus with mild nonproliferative diabetic retinopathy without macular edema, unspecified eye: Secondary | ICD-10-CM

## 2018-04-16 ENCOUNTER — Ambulatory Visit (INDEPENDENT_AMBULATORY_CARE_PROVIDER_SITE_OTHER): Payer: Medicare Other | Admitting: Internal Medicine

## 2018-04-16 ENCOUNTER — Other Ambulatory Visit: Payer: Self-pay

## 2018-04-16 ENCOUNTER — Encounter: Payer: Self-pay | Admitting: Internal Medicine

## 2018-04-16 VITALS — BP 137/83 | HR 89 | Temp 98.5°F | Ht 61.0 in | Wt 215.6 lb

## 2018-04-16 DIAGNOSIS — F17210 Nicotine dependence, cigarettes, uncomplicated: Secondary | ICD-10-CM

## 2018-04-16 DIAGNOSIS — E113299 Type 2 diabetes mellitus with mild nonproliferative diabetic retinopathy without macular edema, unspecified eye: Secondary | ICD-10-CM | POA: Diagnosis not present

## 2018-04-16 DIAGNOSIS — Z930 Tracheostomy status: Secondary | ICD-10-CM

## 2018-04-16 DIAGNOSIS — N184 Chronic kidney disease, stage 4 (severe): Secondary | ICD-10-CM | POA: Diagnosis not present

## 2018-04-16 DIAGNOSIS — I129 Hypertensive chronic kidney disease with stage 1 through stage 4 chronic kidney disease, or unspecified chronic kidney disease: Secondary | ICD-10-CM

## 2018-04-16 DIAGNOSIS — Z794 Long term (current) use of insulin: Secondary | ICD-10-CM | POA: Diagnosis not present

## 2018-04-16 DIAGNOSIS — E1122 Type 2 diabetes mellitus with diabetic chronic kidney disease: Secondary | ICD-10-CM | POA: Diagnosis not present

## 2018-04-16 DIAGNOSIS — Z79899 Other long term (current) drug therapy: Secondary | ICD-10-CM | POA: Diagnosis not present

## 2018-04-16 DIAGNOSIS — Z0000 Encounter for general adult medical examination without abnormal findings: Secondary | ICD-10-CM

## 2018-04-16 DIAGNOSIS — I10 Essential (primary) hypertension: Secondary | ICD-10-CM

## 2018-04-16 LAB — POCT GLYCOSYLATED HEMOGLOBIN (HGB A1C): Hemoglobin A1C: 9.5 % — AB (ref 4.0–5.6)

## 2018-04-16 LAB — GLUCOSE, CAPILLARY: Glucose-Capillary: 216 mg/dL — ABNORMAL HIGH (ref 70–99)

## 2018-04-16 MED ORDER — GLIPIZIDE 5 MG PO TABS: 2.5000 mg | ORAL_TABLET | Freq: Every day | ORAL | 1 refills | Status: DC

## 2018-04-16 MED ORDER — INSULIN DETEMIR 100 UNIT/ML ~~LOC~~ SOLN: SUBCUTANEOUS | 1 refills | Status: DC

## 2018-04-16 NOTE — Progress Notes (Signed)
CC: Diabetes, Hypertension, health Maintenance  HPI:  [name] is a 47 y.o. F with PMHx listed below presenting for Diabetes, Hypertension, health Maintenance. Please see the A&P for the status of the patient's chronic medical problems.   Past Medical History:  Diagnosis Date  . Abnormal LFTs (liver function tests)    Liver U/S and exam c/w HSM. Hep B serology neg. but Hep C ab +, HIV neg. 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 Acute bronchitis 07/29/2014  . Anemia   . Cardiomyopathy, nonischemic ([place])    a. Varying EF over the years - initially 35% in 09/2010. Normal cors 12/2010. b. Echo 07/2014: EF 55-60%, no RWMA, + diastolic flattening and systolic flattening c/w RV volume and pressure overload, mild LAE/RAE, mod dilated RV, mod TR, PASP severely increased at 22mmHg. c. RHC 07/2014: moderate pulmonary HTN likely WHO group 3 with markedly elevated CVP and relatively normal left sided pressures.  . Chronic diastolic CHF (congestive heart failure) ([place])   . Chronic respiratory failure ([place])    a. became O2 dependent in July 2011. b. She required trache placement in 07/2013 and has been followed by pulmonology as well.  . CKD (chronic kidney disease), stage II   . Complication of anesthesia    " difficult waking "  . Diabetes mellitus    insulin dependent  . Diabetic retinopathy    Right eye 2/11  . Essential hypertension   . Health maintenance examination    Mammogram 05/2010 Negative; Last Pap smear 03/2008; Last DM eye exam 2/11> mild non-proliferative diabetic retinopathy. OD  . Helicobacter pylori ab+ 05/2011   Pt was symptomatic and treatment planned for 05/2011  . Hx of cardiac cath 2/08   No CAD, no RAS,  normal EF  . Hypokalemia   . Hypomagnesemia   . Hypoxemia    CT angiogram 9/11>> No PE; PFTs 10/11- FEV1 1.20 (49%) with 16% better p B2, DLCO 33%> corrects to 84; O2 sats ok on 4 lpm X rapid walk X 3 laps  05/2010  . Long term current use of systemic steroids   . Morbid obesity ([place])    Target wt= 153 for BMI <30  . Obesity hypoventilation syndrome ([place])   . Pulmonary hypertension ([place])   . QT prolongation   . Sarcoidosis    Followed by Dr. Melvyn Novas; w/ liver involvement per biopsy 12/09, Reversible airway component so started on [facility] 01/2010; HFA 75% p coaching 05/2010  . Seborrheic dermatitis of scalp   . Sleep apnea   . Tobacco abuse    Review of Systems: Performed and all others negative.  Physical Exam:  There were no vitals filed for this visit. Physical Exam Constitutional:      General: She is not in acute distress.    Appearance: She is well-developed.     Comments: Transported in wheelchair. Trach in place  Cardiovascular:     Rate and Rhythm: Normal rate and regular rhythm.     Heart sounds: Normal heart sounds.  Pulmonary:     Effort: Pulmonary effort is normal. No respiratory distress.     Breath sounds: Normal breath sounds.  Abdominal:     General: Bowel sounds are normal. There is no distension.     Palpations: Abdomen is soft.  Musculoskeletal:        General: No deformity.  Skin:    General: Skin is warm and dry.  Assessment & Plan:   See Encounters Tab for problem based charting.  Patient discussed with Dr. Eppie Gibson

## 2018-04-16 NOTE — Telephone Encounter (Signed)
refills approved

## 2018-04-16 NOTE — Patient Instructions (Addendum)
Thank you for allowing [name] to care for you  For your diabetes - A1c today increased at 9.5 - We will start Glipizide 2.5mg  Daily and increase long acting insulin to 50U daily - We will arrange CGM (Continueous glucose monitor) placement  For your high blood pressure - BP today remain at goal  For your kidney failure - Continue to follow up with Nephrology  Will schedule a PAP smear visit for you  Follow up the first week of January for PAP Smear and CGM Placement  Please also follow up in 3 months

## 2018-04-17 ENCOUNTER — Encounter: Payer: Self-pay | Admitting: Internal Medicine

## 2018-04-17 MED ORDER — GLUCOSE BLOOD VI STRP: ORAL_STRIP | 11 refills | Status: DC

## 2018-04-17 NOTE — Assessment & Plan Note (Signed)
BP today 137/85. She continues to have adequate BP control on current medications after her switch off RAAS-I to Amlodipine. Will continue current regimen. - Amlodipine 10mg  Daily - Coreg 6.25mg  BID

## 2018-04-17 NOTE — Assessment & Plan Note (Signed)
A1c today 9.5; this is significantly worsened from previous of 7.8. She discontinued Metformin following recent labs and workup which shows advance of her CKD to stage 4. She remains on Levemir long acting insulin 46U daily and Victoza 1.8mg  Daily.   Given her renal failure Metformin and SGLT-2 inhibitors are no longer appropriate options. DPP-4-I would be okay with her CKD, but this works through similar mechanism as her GLP-1 (Victoza) so minimal additional benefit would be expected with this. Thiazolidinedionesare contraindicated with her history of reduced EF.  Glipizide appears to be our only choice for additional oral agent. Will start at a lower dose given her renal impairment. Will also increase basal insulin to 50U.  Will plan to have CGM placed at [facility] appointment patient will be having for PAP smear. Will evaluate need for a adjust dosing of meal time insulin based on CGM results. - Increase Levemir to 50U Daily - Continue Victoza 1.8mg  Daily - Start Glipizide 2.5mg  Daily - Follow up in the beginning of Jan for CGM - Return in 3 months for DM - Refill test strips

## 2018-04-17 NOTE — Assessment & Plan Note (Signed)
Patient would prefer to PAP performed in our office, will schedule [facility] visit for this (and CGM placement at that time)

## 2018-04-17 NOTE — Assessment & Plan Note (Addendum)
Following recent concerns for A/C RF patient received referral to nephrology given concern for advancing RF. No acute cause found on their workup. This is suspected to be advancement of her diabetic nephropathy. She is now stage 4 and her Metformin and RAAS inhibition medications have been discontinued. She has follow up with Nephrology next month. - Follow up with Nephrology

## 2018-04-18 ENCOUNTER — Encounter: Payer: Medicare Other | Admitting: Internal Medicine

## 2018-04-18 NOTE — Progress Notes (Signed)
Case discussed with Dr. Trilby Drummer at the time of the visit. We reviewed the resident's history and exam and pertinent patient test results. I agree with the assessment, diagnosis, and plan of care documented in the resident's note.  Short of escalating insulin doses our only oral option at this time is a sulfonylurea that is safe with dosage adjustment in chronic renal disease.

## 2018-04-30 ENCOUNTER — Other Ambulatory Visit: Payer: Self-pay

## 2018-04-30 ENCOUNTER — Inpatient Hospital Stay (HOSPITAL_COMMUNITY)
Admission: EM | Admit: 2018-04-30 | Discharge: 2018-05-18 | DRG: 981 | Disposition: A | Discharge status: Home Health | Payer: Medicare Other | Attending: Oncology | Admitting: Oncology

## 2018-04-30 ENCOUNTER — Emergency Department (HOSPITAL_COMMUNITY): Payer: Medicare Other

## 2018-04-30 ENCOUNTER — Telehealth: Payer: Self-pay | Admitting: Acute Care

## 2018-04-30 ENCOUNTER — Encounter (HOSPITAL_COMMUNITY): Payer: Self-pay | Admitting: Emergency Medicine

## 2018-04-30 DIAGNOSIS — D8689 Sarcoidosis of other sites: Secondary | ICD-10-CM | POA: Diagnosis present

## 2018-04-30 DIAGNOSIS — D869 Sarcoidosis, unspecified: Secondary | ICD-10-CM | POA: Diagnosis present

## 2018-04-30 DIAGNOSIS — I5032 Chronic diastolic (congestive) heart failure: Secondary | ICD-10-CM | POA: Diagnosis present

## 2018-04-30 DIAGNOSIS — E875 Hyperkalemia: Secondary | ICD-10-CM | POA: Diagnosis not present

## 2018-04-30 DIAGNOSIS — K922 Gastrointestinal hemorrhage, unspecified: Secondary | ICD-10-CM

## 2018-04-30 DIAGNOSIS — I272 Pulmonary hypertension, unspecified: Secondary | ICD-10-CM | POA: Diagnosis not present

## 2018-04-30 DIAGNOSIS — J9611 Chronic respiratory failure with hypoxia: Secondary | ICD-10-CM | POA: Diagnosis not present

## 2018-04-30 DIAGNOSIS — M79A22 Nontraumatic compartment syndrome of left lower extremity: Secondary | ICD-10-CM | POA: Diagnosis not present

## 2018-04-30 DIAGNOSIS — N2889 Other specified disorders of kidney and ureter: Secondary | ICD-10-CM | POA: Diagnosis not present

## 2018-04-30 DIAGNOSIS — K219 Gastro-esophageal reflux disease without esophagitis: Secondary | ICD-10-CM | POA: Diagnosis present

## 2018-04-30 DIAGNOSIS — L853 Xerosis cutis: Secondary | ICD-10-CM | POA: Diagnosis present

## 2018-04-30 DIAGNOSIS — K753 Granulomatous hepatitis, not elsewhere classified: Secondary | ICD-10-CM | POA: Diagnosis present

## 2018-04-30 DIAGNOSIS — R140 Abdominal distension (gaseous): Secondary | ICD-10-CM | POA: Diagnosis not present

## 2018-04-30 DIAGNOSIS — I469 Cardiac arrest, cause unspecified: Secondary | ICD-10-CM | POA: Diagnosis not present

## 2018-04-30 DIAGNOSIS — N185 Chronic kidney disease, stage 5: Secondary | ICD-10-CM | POA: Diagnosis present

## 2018-04-30 DIAGNOSIS — B9562 Methicillin resistant Staphylococcus aureus infection as the cause of diseases classified elsewhere: Secondary | ICD-10-CM | POA: Diagnosis not present

## 2018-04-30 DIAGNOSIS — N184 Chronic kidney disease, stage 4 (severe): Secondary | ICD-10-CM | POA: Diagnosis not present

## 2018-04-30 DIAGNOSIS — F17200 Nicotine dependence, unspecified, uncomplicated: Secondary | ICD-10-CM | POA: Diagnosis present

## 2018-04-30 DIAGNOSIS — T884XXA Failed or difficult intubation, initial encounter: Secondary | ICD-10-CM | POA: Diagnosis not present

## 2018-04-30 DIAGNOSIS — K7460 Unspecified cirrhosis of liver: Secondary | ICD-10-CM | POA: Diagnosis present

## 2018-04-30 DIAGNOSIS — I10 Essential (primary) hypertension: Secondary | ICD-10-CM | POA: Diagnosis not present

## 2018-04-30 DIAGNOSIS — E872 Acidosis: Secondary | ICD-10-CM | POA: Diagnosis not present

## 2018-04-30 DIAGNOSIS — T79A22A Traumatic compartment syndrome of left lower extremity, initial encounter: Secondary | ICD-10-CM | POA: Diagnosis not present

## 2018-04-30 DIAGNOSIS — N170 Acute kidney failure with tubular necrosis: Secondary | ICD-10-CM

## 2018-04-30 DIAGNOSIS — D631 Anemia in chronic kidney disease: Secondary | ICD-10-CM | POA: Diagnosis present

## 2018-04-30 DIAGNOSIS — J690 Pneumonitis due to inhalation of food and vomit: Secondary | ICD-10-CM

## 2018-04-30 DIAGNOSIS — R0602 Shortness of breath: Secondary | ICD-10-CM | POA: Diagnosis not present

## 2018-04-30 DIAGNOSIS — J9811 Atelectasis: Secondary | ICD-10-CM | POA: Diagnosis not present

## 2018-04-30 DIAGNOSIS — K2971 Gastritis, unspecified, with bleeding: Secondary | ICD-10-CM | POA: Diagnosis not present

## 2018-04-30 DIAGNOSIS — J9600 Acute respiratory failure, unspecified whether with hypoxia or hypercapnia: Secondary | ICD-10-CM

## 2018-04-30 DIAGNOSIS — J9622 Acute and chronic respiratory failure with hypercapnia: Secondary | ICD-10-CM | POA: Diagnosis not present

## 2018-04-30 DIAGNOSIS — I444 Left anterior fascicular block: Secondary | ICD-10-CM | POA: Diagnosis not present

## 2018-04-30 DIAGNOSIS — Z794 Long term (current) use of insulin: Secondary | ICD-10-CM

## 2018-04-30 DIAGNOSIS — Z95828 Presence of other vascular implants and grafts: Secondary | ICD-10-CM

## 2018-04-30 DIAGNOSIS — J398 Other specified diseases of upper respiratory tract: Secondary | ICD-10-CM | POA: Diagnosis present

## 2018-04-30 DIAGNOSIS — K254 Chronic or unspecified gastric ulcer with hemorrhage: Secondary | ICD-10-CM | POA: Diagnosis not present

## 2018-04-30 DIAGNOSIS — L210 Seborrhea capitis: Secondary | ICD-10-CM | POA: Diagnosis present

## 2018-04-30 DIAGNOSIS — I132 Hypertensive heart and chronic kidney disease with heart failure and with stage 5 chronic kidney disease, or end stage renal disease: Secondary | ICD-10-CM | POA: Diagnosis present

## 2018-04-30 DIAGNOSIS — Z8719 Personal history of other diseases of the digestive system: Secondary | ICD-10-CM | POA: Diagnosis not present

## 2018-04-30 DIAGNOSIS — Z781 Physical restraint status: Secondary | ICD-10-CM

## 2018-04-30 DIAGNOSIS — E1159 Type 2 diabetes mellitus with other circulatory complications: Secondary | ICD-10-CM | POA: Diagnosis not present

## 2018-04-30 DIAGNOSIS — J189 Pneumonia, unspecified organism: Secondary | ICD-10-CM

## 2018-04-30 DIAGNOSIS — Z79899 Other long term (current) drug therapy: Secondary | ICD-10-CM

## 2018-04-30 DIAGNOSIS — Z800 Family history of malignant neoplasm of digestive organs: Secondary | ICD-10-CM

## 2018-04-30 DIAGNOSIS — J9690 Respiratory failure, unspecified, unspecified whether with hypoxia or hypercapnia: Secondary | ICD-10-CM

## 2018-04-30 DIAGNOSIS — K5900 Constipation, unspecified: Secondary | ICD-10-CM | POA: Diagnosis present

## 2018-04-30 DIAGNOSIS — Z833 Family history of diabetes mellitus: Secondary | ICD-10-CM

## 2018-04-30 DIAGNOSIS — J9621 Acute and chronic respiratory failure with hypoxia: Secondary | ICD-10-CM | POA: Diagnosis not present

## 2018-04-30 DIAGNOSIS — N179 Acute kidney failure, unspecified: Secondary | ICD-10-CM | POA: Diagnosis not present

## 2018-04-30 DIAGNOSIS — Z8249 Family history of ischemic heart disease and other diseases of the circulatory system: Secondary | ICD-10-CM

## 2018-04-30 DIAGNOSIS — R7881 Bacteremia: Secondary | ICD-10-CM | POA: Diagnosis not present

## 2018-04-30 DIAGNOSIS — R198 Other specified symptoms and signs involving the digestive system and abdomen: Secondary | ICD-10-CM

## 2018-04-30 DIAGNOSIS — Z6841 Body Mass Index (BMI) 40.0 and over, adult: Secondary | ICD-10-CM

## 2018-04-30 DIAGNOSIS — Z930 Tracheostomy status: Secondary | ICD-10-CM

## 2018-04-30 DIAGNOSIS — E119 Type 2 diabetes mellitus without complications: Secondary | ICD-10-CM

## 2018-04-30 DIAGNOSIS — R109 Unspecified abdominal pain: Secondary | ICD-10-CM | POA: Diagnosis not present

## 2018-04-30 DIAGNOSIS — Z825 Family history of asthma and other chronic lower respiratory diseases: Secondary | ICD-10-CM

## 2018-04-30 DIAGNOSIS — Y95 Nosocomial condition: Secondary | ICD-10-CM | POA: Diagnosis not present

## 2018-04-30 DIAGNOSIS — Z9911 Dependence on respirator [ventilator] status: Secondary | ICD-10-CM

## 2018-04-30 DIAGNOSIS — F17210 Nicotine dependence, cigarettes, uncomplicated: Secondary | ICD-10-CM | POA: Diagnosis present

## 2018-04-30 DIAGNOSIS — Y92230 Patient room in hospital as the place of occurrence of the external cause: Secondary | ICD-10-CM | POA: Diagnosis not present

## 2018-04-30 DIAGNOSIS — E860 Dehydration: Secondary | ICD-10-CM | POA: Diagnosis present

## 2018-04-30 DIAGNOSIS — R161 Splenomegaly, not elsewhere classified: Secondary | ICD-10-CM | POA: Diagnosis present

## 2018-04-30 DIAGNOSIS — I428 Other cardiomyopathies: Secondary | ICD-10-CM | POA: Diagnosis present

## 2018-04-30 DIAGNOSIS — B3781 Candidal esophagitis: Secondary | ICD-10-CM | POA: Diagnosis not present

## 2018-04-30 DIAGNOSIS — Z4659 Encounter for fitting and adjustment of other gastrointestinal appliance and device: Secondary | ICD-10-CM

## 2018-04-30 DIAGNOSIS — G8929 Other chronic pain: Secondary | ICD-10-CM | POA: Diagnosis present

## 2018-04-30 DIAGNOSIS — I2781 Cor pulmonale (chronic): Secondary | ICD-10-CM | POA: Diagnosis present

## 2018-04-30 DIAGNOSIS — R680 Hypothermia, not associated with low environmental temperature: Secondary | ICD-10-CM | POA: Diagnosis not present

## 2018-04-30 DIAGNOSIS — I2729 Other secondary pulmonary hypertension: Secondary | ICD-10-CM | POA: Diagnosis present

## 2018-04-30 DIAGNOSIS — Y828 Other medical devices associated with adverse incidents: Secondary | ICD-10-CM | POA: Diagnosis not present

## 2018-04-30 DIAGNOSIS — R05 Cough: Secondary | ICD-10-CM | POA: Diagnosis not present

## 2018-04-30 DIAGNOSIS — N281 Cyst of kidney, acquired: Secondary | ICD-10-CM | POA: Diagnosis present

## 2018-04-30 DIAGNOSIS — Z9981 Dependence on supplemental oxygen: Secondary | ICD-10-CM

## 2018-04-30 DIAGNOSIS — M940 Chondrocostal junction syndrome [Tietze]: Secondary | ICD-10-CM | POA: Diagnosis present

## 2018-04-30 DIAGNOSIS — Z820 Family history of epilepsy and other diseases of the nervous system: Secondary | ICD-10-CM

## 2018-04-30 DIAGNOSIS — E662 Morbid (severe) obesity with alveolar hypoventilation: Secondary | ICD-10-CM | POA: Diagnosis present

## 2018-04-30 DIAGNOSIS — E1122 Type 2 diabetes mellitus with diabetic chronic kidney disease: Secondary | ICD-10-CM | POA: Diagnosis present

## 2018-04-30 DIAGNOSIS — D509 Iron deficiency anemia, unspecified: Secondary | ICD-10-CM | POA: Diagnosis present

## 2018-04-30 DIAGNOSIS — J9601 Acute respiratory failure with hypoxia: Secondary | ICD-10-CM

## 2018-04-30 DIAGNOSIS — Z01818 Encounter for other preprocedural examination: Secondary | ICD-10-CM

## 2018-04-30 DIAGNOSIS — D62 Acute posthemorrhagic anemia: Secondary | ICD-10-CM | POA: Diagnosis present

## 2018-04-30 DIAGNOSIS — M10071 Idiopathic gout, right ankle and foot: Secondary | ICD-10-CM | POA: Diagnosis not present

## 2018-04-30 DIAGNOSIS — R402434 Glasgow coma scale score 3-8, 24 hours or more after hospital admission: Secondary | ICD-10-CM | POA: Diagnosis not present

## 2018-04-30 DIAGNOSIS — Z789 Other specified health status: Secondary | ICD-10-CM

## 2018-04-30 DIAGNOSIS — E113291 Type 2 diabetes mellitus with mild nonproliferative diabetic retinopathy without macular edema, right eye: Secondary | ICD-10-CM | POA: Diagnosis present

## 2018-04-30 DIAGNOSIS — I2721 Secondary pulmonary arterial hypertension: Secondary | ICD-10-CM | POA: Diagnosis present

## 2018-04-30 DIAGNOSIS — Z885 Allergy status to narcotic agent status: Secondary | ICD-10-CM

## 2018-04-30 DIAGNOSIS — Z9851 Tubal ligation status: Secondary | ICD-10-CM

## 2018-04-30 LAB — URINALYSIS, ROUTINE W REFLEX MICROSCOPIC
Bilirubin Urine: NEGATIVE
Glucose, UA: 50 mg/dL — AB
Ketones, ur: NEGATIVE mg/dL
Leukocytes, UA: NEGATIVE
Nitrite: NEGATIVE
Protein, ur: >=300 mg/dL — AB
Specific Gravity, Urine: 1.01 (ref 1.005–1.030)
pH: 5 (ref 5.0–8.0)

## 2018-04-30 LAB — COMPREHENSIVE METABOLIC PANEL
ALT: 24 U/L (ref 0–44)
AST: 28 U/L (ref 15–41)
Albumin: 3.5 g/dL (ref 3.5–5.0)
Alkaline Phosphatase: 209 U/L — ABNORMAL HIGH (ref 38–126)
Anion gap: 5 (ref 5–15)
BUN: 37 mg/dL — ABNORMAL HIGH (ref 6–20)
CO2: 26 mmol/L (ref 22–32)
Calcium: 8.6 mg/dL — ABNORMAL LOW (ref 8.9–10.3)
Chloride: 109 mmol/L (ref 98–111)
Creatinine, Ser: 3.23 mg/dL — ABNORMAL HIGH (ref 0.44–1.00)
GFR calc Af Amer: 19 mL/min — ABNORMAL LOW (ref >60)
GFR calc non Af Amer: 16 mL/min — ABNORMAL LOW (ref >60)
Glucose, Bld: 143 mg/dL — ABNORMAL HIGH (ref 70–99)
Potassium: 6.9 mmol/L (ref 3.5–5.1)
Sodium: 140 mmol/L (ref 135–145)
Total Bilirubin: 0.3 mg/dL (ref 0.3–1.2)
Total Protein: 7.5 g/dL (ref 6.5–8.1)

## 2018-04-30 LAB — CBC WITH DIFFERENTIAL/PLATELET
Abs Immature Granulocytes: 0.02 10*3/uL (ref 0.00–0.07)
Basophils Absolute: 0 10*3/uL (ref 0.0–0.1)
Basophils Relative: 0 %
Eosinophils Absolute: 0.2 10*3/uL (ref 0.0–0.5)
Eosinophils Relative: 6 %
HCT: 37.3 % (ref 36.0–46.0)
Hemoglobin: 10.8 g/dL — ABNORMAL LOW (ref 12.0–15.0)
Immature Granulocytes: 1 %
Lymphocytes Relative: 33 %
Lymphs Abs: 1.3 10*3/uL (ref 0.7–4.0)
MCH: 29.6 pg (ref 26.0–34.0)
MCHC: 29 g/dL — ABNORMAL LOW (ref 30.0–36.0)
MCV: 102.2 fL — ABNORMAL HIGH (ref 80.0–100.0)
Monocytes Absolute: 0.7 10*3/uL (ref 0.1–1.0)
Monocytes Relative: 16 %
Neutro Abs: 1.8 10*3/uL (ref 1.7–7.7)
Neutrophils Relative %: 44 %
Platelets: 162 10*3/uL (ref 150–400)
RBC: 3.65 MIL/uL — ABNORMAL LOW (ref 3.87–5.11)
RDW: 12.8 % (ref 11.5–15.5)
WBC: 4 10*3/uL (ref 4.0–10.5)
nRBC: 0 % (ref 0.0–0.2)

## 2018-04-30 LAB — POCT I-STAT, CHEM 8
BUN: 35 mg/dL — ABNORMAL HIGH (ref 6–20)
Calcium, Ion: 1.2 mmol/L (ref 1.15–1.40)
Chloride: 109 mmol/L (ref 98–111)
Creatinine, Ser: 3.4 mg/dL — ABNORMAL HIGH (ref 0.44–1.00)
Glucose, Bld: 102 mg/dL — ABNORMAL HIGH (ref 70–99)
HCT: 35 % — ABNORMAL LOW (ref 36.0–46.0)
Hemoglobin: 11.9 g/dL — ABNORMAL LOW (ref 12.0–15.0)
Potassium: 6.6 mmol/L (ref 3.5–5.1)
Sodium: 139 mmol/L (ref 135–145)
TCO2: 27 mmol/L (ref 22–32)

## 2018-04-30 LAB — GLUCOSE, CAPILLARY
Glucose-Capillary: 118 mg/dL — ABNORMAL HIGH (ref 70–99)
Glucose-Capillary: 158 mg/dL — ABNORMAL HIGH (ref 70–99)

## 2018-04-30 LAB — BRAIN NATRIURETIC PEPTIDE: B Natriuretic Peptide: 80.6 pg/mL (ref 0.0–100.0)

## 2018-04-30 LAB — SODIUM, URINE, RANDOM: Sodium, Ur: 88 mmol/L

## 2018-04-30 LAB — CREATININE, URINE, RANDOM: Creatinine, Urine: 94.84 mg/dL

## 2018-04-30 LAB — POTASSIUM: Potassium: 6.3 mmol/L (ref 3.5–5.1)

## 2018-04-30 LAB — MAGNESIUM: Magnesium: 2.4 mg/dL (ref 1.7–2.4)

## 2018-04-30 MED ORDER — INSULIN DETEMIR 100 UNIT/ML ~~LOC~~ SOLN
20.0000 [IU] | Freq: Every day | SUBCUTANEOUS | Status: DC
  Administered 2018-04-30 – 2018-05-02 (×3): 20 [IU] via SUBCUTANEOUS
  Filled 2018-04-30 (×4): qty 0.2

## 2018-04-30 MED ORDER — SODIUM CHLORIDE 0.9 % IV BOLUS
500.0000 mL | Freq: Once | INTRAVENOUS | Status: AC
  Administered 2018-04-30: 500 mL via INTRAVENOUS

## 2018-04-30 MED ORDER — ONDANSETRON HCL 4 MG/2ML IJ SOLN
4.0000 mg | Freq: Four times a day (QID) | INTRAMUSCULAR | Status: DC | PRN
  Administered 2018-05-07: 4 mg via INTRAVENOUS
  Filled 2018-04-30: qty 2

## 2018-04-30 MED ORDER — INSULIN ASPART 100 UNIT/ML ~~LOC~~ SOLN
0.0000 [IU] | Freq: Every day | SUBCUTANEOUS | Status: DC
  Administered 2018-05-02: 3 [IU] via SUBCUTANEOUS

## 2018-04-30 MED ORDER — AMLODIPINE BESYLATE 10 MG PO TABS
10.0000 mg | ORAL_TABLET | Freq: Every day | ORAL | Status: DC
  Administered 2018-05-01 – 2018-05-03 (×3): 10 mg via ORAL
  Filled 2018-04-30 (×4): qty 1

## 2018-04-30 MED ORDER — FAMOTIDINE 20 MG PO TABS: 20.0000 mg | ORAL_TABLET | Freq: Two times a day (BID) | ORAL | Status: DC

## 2018-04-30 MED ORDER — ACETAMINOPHEN 325 MG PO TABS: 650.0000 mg | ORAL_TABLET | Freq: Four times a day (QID) | ORAL | Status: DC | PRN

## 2018-04-30 MED ORDER — FUROSEMIDE 10 MG/ML IJ SOLN
20.0000 mg | Freq: Once | INTRAMUSCULAR | Status: AC
  Administered 2018-04-30: 20 mg via INTRAVENOUS
  Filled 2018-04-30: qty 4

## 2018-04-30 MED ORDER — INSULIN ASPART 100 UNIT/ML IV SOLN
5.0000 [IU] | Freq: Once | INTRAVENOUS | Status: AC
  Administered 2018-04-30: 5 [IU] via INTRAVENOUS
  Filled 2018-04-30 (×2): qty 0.05

## 2018-04-30 MED ORDER — HEPARIN SODIUM (PORCINE) 5000 UNIT/ML IJ SOLN
5000.0000 [IU] | Freq: Three times a day (TID) | INTRAMUSCULAR | Status: DC
  Administered 2018-04-30 – 2018-05-03 (×8): 5000 [IU] via SUBCUTANEOUS
  Filled 2018-04-30 (×9): qty 1

## 2018-04-30 MED ORDER — FAMOTIDINE 20 MG PO TABS
20.0000 mg | ORAL_TABLET | Freq: Every day | ORAL | Status: DC
  Administered 2018-05-01 – 2018-05-03 (×3): 20 mg via ORAL
  Filled 2018-04-30 (×4): qty 1

## 2018-04-30 MED ORDER — SODIUM CHLORIDE 0.9% FLUSH
3.0000 mL | Freq: Two times a day (BID) | INTRAVENOUS | Status: DC
  Administered 2018-05-01 – 2018-05-18 (×24): 3 mL via INTRAVENOUS

## 2018-04-30 MED ORDER — ALBUTEROL SULFATE (2.5 MG/3ML) 0.083% IN NEBU
10.0000 mg | INHALATION_SOLUTION | Freq: Once | RESPIRATORY_TRACT | Status: AC
  Administered 2018-04-30: 10 mg via RESPIRATORY_TRACT
  Filled 2018-04-30: qty 12

## 2018-04-30 MED ORDER — INSULIN ASPART 100 UNIT/ML ~~LOC~~ SOLN
0.0000 [IU] | Freq: Three times a day (TID) | SUBCUTANEOUS | Status: DC
  Administered 2018-05-01 (×2): 1 [IU] via SUBCUTANEOUS
  Administered 2018-05-01: 5 [IU] via SUBCUTANEOUS
  Administered 2018-05-02 (×3): 2 [IU] via SUBCUTANEOUS
  Administered 2018-05-03 (×2): 1 [IU] via SUBCUTANEOUS

## 2018-04-30 MED ORDER — SODIUM CHLORIDE 0.9% FLUSH: 3.0000 mL | INTRAVENOUS | Status: DC | PRN

## 2018-04-30 MED ORDER — CARVEDILOL 6.25 MG PO TABS
6.2500 mg | ORAL_TABLET | Freq: Two times a day (BID) | ORAL | Status: DC
  Administered 2018-04-30 – 2018-05-03 (×6): 6.25 mg via ORAL
  Filled 2018-04-30 (×6): qty 1

## 2018-04-30 MED ORDER — SODIUM CHLORIDE 0.9% FLUSH
3.0000 mL | Freq: Two times a day (BID) | INTRAVENOUS | Status: DC
  Administered 2018-04-30 – 2018-05-06 (×8): 3 mL via INTRAVENOUS

## 2018-04-30 MED ORDER — SODIUM CHLORIDE 0.9 % IV SOLN
INTRAVENOUS | Status: AC
  Administered 2018-04-30: via INTRAVENOUS

## 2018-04-30 MED ORDER — ONDANSETRON HCL 4 MG PO TABS: 4.0000 mg | ORAL_TABLET | Freq: Four times a day (QID) | ORAL | Status: DC | PRN

## 2018-04-30 MED ORDER — ACETAMINOPHEN 650 MG RE SUPP: 650.0000 mg | Freq: Four times a day (QID) | RECTAL | Status: DC | PRN

## 2018-04-30 MED ORDER — SODIUM CHLORIDE 0.9 % IV SOLN: 250.0000 mL | INTRAVENOUS | Status: DC | PRN

## 2018-04-30 MED ORDER — DEXTROSE 50 % IV SOLN
1.0000 | Freq: Once | INTRAVENOUS | Status: AC
  Administered 2018-04-30: 50 mL via INTRAVENOUS
  Filled 2018-04-30: qty 50

## 2018-04-30 MED ORDER — ALBUTEROL SULFATE (2.5 MG/3ML) 0.083% IN NEBU
2.5000 mg | INHALATION_SOLUTION | RESPIRATORY_TRACT | Status: DC | PRN
  Administered 2018-05-03: 2.5 mg via RESPIRATORY_TRACT
  Filled 2018-04-30 (×2): qty 3

## 2018-04-30 NOTE — H&P (Signed)
History and Physical    Melody Gray NUU:[id number] DOB: [birth year] DOA: 04/30/2018  PCP: Neva Seat, MD   Patient coming from: Home   Chief Complaint: Cough, SOB, loss of appetite  HPI: Melody Gray is a 47 y.o. female with medical history significant for chronic kidney disease stage IV, chronic microcytic anemia, insulin-dependent diabetes mellitus, sarcoidosis with pulmonary and hepatic involvement, and chronic respiratory failure with tracheostomy, now presenting to the emergency department for evaluation of cough, increased dyspnea, and loss of appetite.  Patient reports that she developed increase in her chronic cough and shortness of breath approximately 2 weeks ago, symptoms have been waxing and waning since then, not necessarily worsening, but failing to improve.  Cough is productive of thick mucus.  She denies any fevers or chest pain, denies lower extremity swelling or tenderness, and has avoided relatives with upper respiratory illness.  Reports that her sarcoidosis has been quiesced sent and she is not currently under treatment for that.  Denies vomiting or diarrhea but reports that her appetite has been poor for the last couple weeks and she has not been eating or drinking much.  ED Course: Upon arrival to the ED, patient is found to be afebrile, saturating well on 2 L/min of supplemental oxygen, and with vitals otherwise normal.  EKG features a sinus rhythm with LAFB and mild peaking of T waves.  Chest x-ray is notable for cardiomegaly with pulmonary vascular congestion, but no frank edema.  KUB is normal.  Chemistry panel is notable for potassium of 6.9 and creatinine of 3.23, up from 2.7 in October.  CBC features a stable chronic anemia with hemoglobin 10.8.  Patient was treated with 500 cc normal saline, 20 mg IV Lasix, insulin with dextrose, and continuous albuterol neb in the ED.  She remains hemodynamically stable and will be observed for further  evaluation and management of acute kidney injury superimposed on CKD 4 with hyperkalemia.  Review of Systems:  All other systems reviewed and apart from HPI, are negative.  Past Medical History:  Diagnosis Date  . Abnormal LFTs (liver function tests)    Liver U/S and exam c/w HSM. Hep B serology neg. but Hep C ab +, HIV neg. 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 Acute bronchitis 07/29/2014  . Anemia   . Cardiomyopathy, nonischemic ([place])    a. Varying EF over the years - initially 35% in 09/2010. Normal cors 12/2010. b. Echo 07/2014: EF 55-60%, no RWMA, + diastolic flattening and systolic flattening c/w RV volume and pressure overload, mild LAE/RAE, mod dilated RV, mod TR, PASP severely increased at 14mmHg. c. RHC 07/2014: moderate pulmonary HTN likely WHO group 3 with markedly elevated CVP and relatively normal left sided pressures.  . Chronic diastolic CHF (congestive heart failure) ([place])   . Chronic respiratory failure ([place])    a. became O2 dependent in July 2011. b. She required trache placement in 07/2013 and has been followed by pulmonology as well.  . CKD (chronic kidney disease), stage II   . Complication of anesthesia    " difficult waking "  . Diabetes mellitus    insulin dependent  . Diabetic retinopathy    Right eye 2/11  . Essential hypertension   . Health maintenance examination    Mammogram 05/2010 Negative; Last Pap smear 03/2008; Last DM eye exam 2/11> mild non-proliferative diabetic retinopathy. OD  . Helicobacter pylori ab+ 05/2011   Pt was  symptomatic and treatment planned for 05/2011  . Hx of cardiac cath 2/08   No CAD, no RAS,  normal EF  . Hypokalemia   . Hypomagnesemia   . Hypoxemia    CT angiogram 9/11>> No PE; PFTs 10/11- FEV1 1.20 (49%) with 16% better p B2, DLCO 33%> corrects to 84; O2 sats ok on 4 lpm X rapid walk X 3 laps 05/2010  . Long term current use of systemic steroids   . Morbid obesity ([place])    Target  wt= 153 for BMI <30  . Obesity hypoventilation syndrome ([place])   . Pulmonary hypertension ([place])   . QT prolongation   . Sarcoidosis    Followed by Dr. Melvyn Novas; w/ liver involvement per biopsy 12/09, Reversible airway component so started on [facility] 01/2010; HFA 75% p coaching 05/2010  . Seborrheic dermatitis of scalp   . Sleep apnea   . Tobacco abuse     Past Surgical History:  Procedure Laterality Date  . BREAST SURGERY    . CESAREAN SECTION    . RIGHT HEART CATHETERIZATION N/A 08/12/2014   Procedure: RIGHT HEART CATH;  Surgeon: Jolaine Artist, MD;  Location: [facility] CATH LAB;  Service: Cardiovascular;  Laterality: N/A;  . TRACHEOSTOMY TUBE PLACEMENT N/A 08/10/2013   Procedure: TRACHEOSTOMY;  Surgeon: Melissa Montane, MD;  Location: [place];  Service: ENT;  Laterality: N/A;  . TUBAL LIGATION       reports that she has been smoking cigarettes. She started smoking about 3 years ago. She has a 6.60 pack-year smoking history. She has never used smokeless tobacco. She reports that she does not drink alcohol or use drugs.  Allergies  Allergen Reactions  . Vicodin [Hydrocodone-Acetaminophen] Itching    Family History  Problem Relation Age of Onset  . Cancer Mother        colon cancer  . Multiple sclerosis Father   . Diabetes Father   . Asthma Sister        in childhood  . Hypertension Other      Prior to Admission medications   Medication Sig Start Date End Date Taking? Authorizing Provider  amLODipine (NORVASC) 10 MG tablet Take 1 tablet (10 mg total) by mouth daily. 01/22/18 01/22/19 Yes Alphonzo Grieve, MD  carvedilol (COREG) 6.25 MG tablet Take 1 tablet (6.25 mg total) by mouth 2 (two) times daily. 07/23/17  Yes Lorella Nimrod, MD  glipiZIDE (GLUCOTROL) 5 MG tablet Take 0.5 tablets (2.5 mg total) by mouth daily before breakfast. 04/16/18  Yes Neva Seat, MD  insulin detemir (LEVEMIR) 100 UNIT/ML injection ADMINISTER 50 UNITS UNDER THE SKIN AT BEDTIME 04/16/18  Yes Neva Seat, MD   liraglutide (VICTOZA) 18 MG/3ML SOPN Inject 0.3 mLs (1.8 mg total) into the skin daily. 05/07/17  Yes Alphonzo Grieve, MD  Pseudoephedrine-DM-GG (ROBITUSSIN COLD & COUGH PO) Take 15 mLs by mouth daily as needed (cough).   Yes [provider]  ranitidine (ZANTAC) 150 MG tablet Take 1 tablet (150 mg total) by mouth 2 (two) times daily. 04/11/18 04/11/19 Yes [name], NP  atorvastatin (LIPITOR) 40 MG tablet Take 1 tablet (40 mg total) by mouth daily. Patient not taking: Reported on 04/30/2018 01/17/18   Neva Seat, MD  BD INSULIN SYRINGE U/F 30G X 1/2" 0.5 ML MISC USE AS DIRECTED 04/16/18   Neva Seat, MD  glucose blood test strip Use to check blood sugar 3 times daily. Dx code E11.65. Insulin dependent 04/17/18   Neva Seat, MD  Insulin Pen Needle (  B-D UF III MINI PEN NEEDLES) 31G X 5 MM MISC USE TWICE A DAY 11/22/17   Neva Seat, MD    Physical Exam: Vitals:   04/30/18 1523 04/30/18 1851 04/30/18 1856  BP: (!) 157/87 (!) 139/92   Pulse: 95 89   Resp: 18 18   Temp: 98 F (36.7 C)  98.2 F (36.8 C)  TempSrc: Oral  Oral  SpO2: 95% 95%   Weight:   98.4 kg  Height:   5\' 1"  (1.549 m)    Constitutional: NAD, calm  Eyes: PERTLA, lids and conjunctivae normal ENMT: Mucous membranes are moist. Posterior pharynx clear of any exudate or lesions.   Neck: normal, supple, no masses, no thyromegaly Respiratory: upper airways noise with lungs clear to auscultation b/l, no wheezing, no crackles. Normal respiratory effort.   Cardiovascular: S1 & S2 heard, regular rate and rhythm. No extremity edema. No significant JVD. Abdomen: No distension, no tenderness, soft. Bowel sounds active.  Musculoskeletal: no clubbing / cyanosis. No joint deformity upper and lower extremities.    Skin: no significant rashes, lesions, ulcers. Xerosis. Warm, dry, well-perfused. Neurologic: No facial asymmetry. Sensation intact. Moving all extremities.  Psychiatric: Alert and oriented  x 3. Very pleasant, cooperative.    Labs on Admission: I have personally reviewed following labs and imaging studies  CBC: Recent Labs  Lab 04/30/18 1902 04/30/18 2116  WBC 4.0  --   NEUTROABS 1.8  --   HGB 10.8* 11.9*  HCT 37.3 35.0*  MCV 102.2*  --   PLT 162  --    Basic Metabolic Panel: Recent Labs  Lab 04/30/18 1902 04/30/18 2116  NA 140 139  K 6.9* 6.6*  CL 109 109  CO2 26  --   GLUCOSE 143* 102*  BUN 37* 35*  CREATININE 3.23* 3.40*  CALCIUM 8.6*  --    GFR: Estimated Creatinine Clearance: 21.5 mL/min (A) (by C-G formula based on SCr of 3.4 mg/dL (H)). Liver Function Tests: Recent Labs  Lab 04/30/18 1902  AST 28  ALT 24  ALKPHOS 209*  BILITOT 0.3  PROT 7.5  ALBUMIN 3.5   No results for input(s): LIPASE, AMYLASE in the last 168 hours. No results for input(s): AMMONIA in the last 168 hours. Coagulation Profile: No results for input(s): INR, PROTIME in the last 168 hours. Cardiac Enzymes: No results for input(s): CKTOTAL, CKMB, CKMBINDEX, TROPONINI in the last 168 hours. BNP (last 3 results) No results for input(s): PROBNP in the last 8760 hours. HbA1C: No results for input(s): HGBA1C in the last 72 hours. CBG: No results for input(s): GLUCAP in the last 168 hours. Lipid Profile: No results for input(s): CHOL, HDL, LDLCALC, TRIG, CHOLHDL, LDLDIRECT in the last 72 hours. Thyroid Function Tests: No results for input(s): TSH, T4TOTAL, FREET4, T3FREE, THYROIDAB in the last 72 hours. Anemia Panel: No results for input(s): VITAMINB12, FOLATE, FERRITIN, TIBC, IRON, RETICCTPCT in the last 72 hours. Urine analysis:    Component Value Date/Time   COLORURINE YELLOW 08/19/2014 1605   APPEARANCEUR Cloudy (A) 01/22/2018 1000   LABSPEC 1.015 12/27/2015 1039   PHURINE 5.5 12/27/2015 1039   GLUCOSEU Trace (A) 01/22/2018 1000   GLUCOSEU NEG mg/dL 09/29/2009 1450   HGBUR SMALL (A) 12/27/2015 1039   BILIRUBINUR Negative 01/22/2018 1000   KETONESUR NEGATIVE  12/27/2015 1039   PROTEINUR 3+ (A) 01/22/2018 1000   PROTEINUR 30 (A) 12/27/2015 1039   UROBILINOGEN 0.2 12/27/2015 1039   NITRITE Negative 01/22/2018 1000   NITRITE NEGATIVE 12/27/2015 1039  LEUKOCYTESUR Negative 01/22/2018 1000   Sepsis Labs: @LABRCNTIP (procalcitonin:4,lacticidven:4) )No results found for this or any previous visit (from the past 240 hour(s)).   Radiological Exams on Admission: Dg Chest 2 View  Result Date: 04/30/2018 CLINICAL DATA:  Cough and abdominal distension for 1 week EXAM: CHEST - 2 VIEW COMPARISON:  November 04, 2017 FINDINGS: The mediastinal contour is normal. The heart size is enlarged. There is central pulmonary vascular congestion. Tracheostomy tube is identified unchanged. Mild atelectasis of both lung bases are noted. There is no focal pneumonia, pleural effusion. The visualized skeletal structures are stable. IMPRESSION: Cardiomegaly with central pulmonary vascular congestion. No frank pulmonary edema is identified. Mild atelectasis of both lung bases. Electronically Signed   By: Abelardo Diesel M.D.   On: 04/30/2018 16:52   Dg Abd 1 View  Result Date: 04/30/2018 CLINICAL DATA:  Shortness of breath and abdominal distention EXAM: ABDOMEN - 1 VIEW COMPARISON:  09/06/2017 abdominal CT FINDINGS: Large liver correlating with 09/06/2017 abdominal CT. Fibrotic changes at the lung bases. No evidence of bowel obstruction. No concerning calcification. IMPRESSION: Normal bowel gas pattern. Electronically Signed   By: Monte Fantasia M.D.   On: 04/30/2018 20:28    EKG: Independently reviewed. Sinus rhythm, LAFB, mild peaking of T-waves.   Assessment/Plan   1. Hyperkalemia  - Serum potassium is 6.9 in ED with peaked T-waves in setting of AKI superimposed on CKD IV  - Temporized with NS bolus, Lasix, albuterol neb, and insulin/dextrose in ED  - Continue cardiac monitoring, follow serial potassium levels until normal, repeat temporizing measures as-needed, low-potassium  diet   2. Acute kidney injury superimposed on CKD IV  - SCr is 3.23 on admission, up from 2.7 in October  - Likely prerenal azotemia in setting of recent poor appetite  - Check UA and urine chemistries, continue gentle IVF hydration, renally-dose medications, avoid nephrotoxins, and repeat chem panel in am    3. Chronic hypoxic respiratory failure - Reports 2 wks of increased SOB and cough, not in any distress in ED, speaking full sentences  - Continue supplemental O2, as-needed nebs, trach care    4. Chronic pulmonary HTN  - Likely secondary to sarcoidosis; echo from 2016 with preserved LVEF, moderate TR, severely increased PA pressures, diastolic and systolic flattening of intraventricular septum  - Cephalization noted on CXR, wt appears stable, no rales or JVD   - Given Lasix and IVF in ED in setting of hyperkalemia  - Continue gentle IVF hydration as above, follow daily wt and I/O's    5. Cirrhosis  - Appears compensated, attributed to sarcoidosis    6. Insulin-dependent DM  - A1c was 9.5% in December 2019  - Managed at home with Levemir 50 units qHS, Victoza, and glipizide  - Check CBG's, continue Levemir with dose-reduction in light of AKI, and use SSI with Novolog while in hospital    7. Hypertension  - BP at goal, continue Norvasc and Coreg     DVT prophylaxis: sq heparin  Code Status: Full  Family Communication: Discussed with patient  Consults called: None  Admission status: Observation    Vianne Bulls, MD [facility] Pager [phone]  If 7PM-7AM, please contact night-coverage [email or web address] Password [email or web address]  04/30/2018, 9:20 PM

## 2018-04-30 NOTE — ED Notes (Signed)
Date and time results received: 04/30/18 8:28 PM  (use smartphrase ".now" to insert current time)  Test: Potassium Critical Value: 6.9  Name of Provider Notified: Sophia EDPA  Orders Received? Or Actions Taken?: Orders Received - See Orders for details

## 2018-04-30 NOTE — ED Triage Notes (Signed)
Patient c/o cough and abdominal distension x1 week. Hx CHF. Sent from PCP for low O2. On 2L [place] at baseline. Patient has a trach.

## 2018-04-30 NOTE — ED Provider Notes (Signed)
[facility] DEPT Provider Note   CSN: [id number] Arrival date & time: 04/30/18  1516     History   Chief Complaint Chief Complaint  Patient presents with  . Cough    HPI Melody Gray is a 47 y.o. female presenting for evaluation of cough, shortness of breath, and abdominal distention.  Patient states for the past week, she has been having gradually worsening shortness of breath and cough.  Additionally, she feels like her abdomen is distended.  She states she is concerned that she is fluid overloaded.  She called the provider who manages her trach, who recommended she comes to the ER for further evaluation.  She denies fevers, chills, sore throat, chest pain, nausea, vomiting, abdominal pain, urinary symptoms, or leg swelling.  Patient states this feels different from when she is normally fluid overloaded.  Patient has significant medical history including diabetes, hypertension, CHF, sarcoidosis, tracheostomy placed in 2015 for OSA, and CKD.  Patient states she has had difficulty having bowel movements over the past several weeks, states she has not had a good or normal bowel movement for the past 2 weeks.  She has to strain a lot, and not very much comes out.  HPI  Past Medical History:  Diagnosis Date  . Abnormal LFTs (liver function tests)    Liver U/S and exam c/w HSM. Hep B serology neg. but Hep C ab +, HIV neg. 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 Acute bronchitis 07/29/2014  . Anemia   . Cardiomyopathy, nonischemic ([place])    a. Varying EF over the years - initially 35% in 09/2010. Normal cors 12/2010. b. Echo 07/2014: EF 55-60%, no RWMA, + diastolic flattening and systolic flattening c/w RV volume and pressure overload, mild LAE/RAE, mod dilated RV, mod TR, PASP severely increased at 75mmHg. c. RHC 07/2014: moderate pulmonary HTN likely WHO group 3 with markedly elevated CVP and relatively  normal left sided pressures.  . Chronic diastolic CHF (congestive heart failure) ([place])   . Chronic respiratory failure ([place])    a. became O2 dependent in July 2011. b. She required trache placement in 07/2013 and has been followed by pulmonology as well.  . CKD (chronic kidney disease), stage II   . Complication of anesthesia    " difficult waking "  . Diabetes mellitus    insulin dependent  . Diabetic retinopathy    Right eye 2/11  . Essential hypertension   . Health maintenance examination    Mammogram 05/2010 Negative; Last Pap smear 03/2008; Last DM eye exam 2/11> mild non-proliferative diabetic retinopathy. OD  . Helicobacter pylori ab+ 05/2011   Pt was symptomatic and treatment planned for 05/2011  . Hx of cardiac cath 2/08   No CAD, no RAS,  normal EF  . Hypokalemia   . Hypomagnesemia   . Hypoxemia    CT angiogram 9/11>> No PE; PFTs 10/11- FEV1 1.20 (49%) with 16% better p B2, DLCO 33%> corrects to 84; O2 sats ok on 4 lpm X rapid walk X 3 laps 05/2010  . Long term current use of systemic steroids   . Morbid obesity ([place])    Target wt= 153 for BMI <30  . Obesity hypoventilation syndrome ([place])   . Pulmonary hypertension ([place])   . QT prolongation   . Sarcoidosis    Followed by Dr. Melvyn Novas; w/ liver involvement per biopsy 12/09, Reversible airway component so started on [facility]  01/2010; HFA 75% p coaching 05/2010  . Seborrheic dermatitis of scalp   . Sleep apnea   . Tobacco abuse     Patient Active Problem List   Diagnosis Date Noted  . Hyperkalemia 04/30/2018  . Chronic right-sided CHF (congestive heart failure) ([place]) 04/30/2018  . Productive cough 05/07/2017  . Gallbladder polyp 05/07/2017  . Cirrhosis ([place]) 05/07/2017  . Chronic respiratory failure with hypoxia (HCC)/ trach dep 04/06/2017  . Osteoporosis screening 12/09/2015  . OSA (obstructive sleep apnea)   . Difficult airway   . Morbid obesity due to excess calories ([place]) complicated by OHS/ hbp 16/96/7893  .  Tracheostomy dependent ([place])   . Cigarette smoker   . Obesity hypoventilation syndrome ([place])   . CKD stage 4 due to type 2 diabetes mellitus ([place])   . Cardiomyopathy, nonischemic ([place])   . Hypomagnesemia 09/15/2014  . Long term current use of systemic steroids 05/05/2014  . Health care maintenance 02/22/2014  . Sleep apnea 12/17/2013  . Adjustment disorder with depressed mood 11/13/2013  . Tracheostomy status ([place]) 10/28/2013  . Hyperlipidemia with target LDL less than 100 09/15/2013  . Smoker 07/25/2013  . ASCUS (atypical squamous cells of undetermined significance) on Pap smear 09/03/2012  . Left foot pain 09/03/2012  . Arterial epistaxis 11/13/2011  . AKI (acute kidney injury) ([place]) 05/10/2011  . Helicobacter pylori ab+ 05/02/2011  . Pulmonary hypertension associated with sarcoidosis ([place]) 10/05/2010  . Left hip pain 08/02/2010  . Anemia in chronic renal disease 01/19/2010  . Lesion of liver and cirrhosis on imaging 10/01/2009  . Mild nonproliferative diabetic retinopathy associated with type 2 diabetes mellitus ([place]) 06/16/2009  . Chronic diastolic heart failure, grade 2 04/28/2009  . NEPHROTIC SYNDROME 04/12/2009  . Body mass index (BMI) of 40.1-44.9 in adult ([place]) 04/06/2009  . GERD (gastroesophageal reflux disease) 06/12/2008  . Sarcoidosis ([place]) 05/22/2008  . Insulin-requiring or dependent type II diabetes mellitus ([place]) 02/10/2007  . Hypertension complicating diabetes ([place]) Dec 14, 202008  . History of prolonged Q-T interval on ECG Dec 14, 202008    Past Surgical History:  Procedure Laterality Date  . BREAST SURGERY    . CESAREAN SECTION    . RIGHT HEART CATHETERIZATION N/A 08/12/2014   Procedure: RIGHT HEART CATH;  Surgeon: Jolaine Artist, MD;  Location: [facility] CATH LAB;  Service: Cardiovascular;  Laterality: N/A;  . TRACHEOSTOMY TUBE PLACEMENT N/A 08/10/2013   Procedure: TRACHEOSTOMY;  Surgeon: Melissa Montane, MD;  Location: [facility];  Service: ENT;  Laterality: N/A;  . TUBAL  LIGATION       OB History   No obstetric history on file.      Home Medications    Prior to Admission medications   Medication Sig Start Date End Date Taking? Authorizing Provider  amLODipine (NORVASC) 10 MG tablet Take 1 tablet (10 mg total) by mouth daily. 01/22/18 01/22/19 Yes Alphonzo Grieve, MD  carvedilol (COREG) 6.25 MG tablet Take 1 tablet (6.25 mg total) by mouth 2 (two) times daily. 07/23/17  Yes Lorella Nimrod, MD  glipiZIDE (GLUCOTROL) 5 MG tablet Take 0.5 tablets (2.5 mg total) by mouth daily before breakfast. 04/16/18  Yes Neva Seat, MD  insulin detemir (LEVEMIR) 100 UNIT/ML injection ADMINISTER 50 UNITS UNDER THE SKIN AT BEDTIME 04/16/18  Yes Neva Seat, MD  liraglutide (VICTOZA) 18 MG/3ML SOPN Inject 0.3 mLs (1.8 mg total) into the skin daily. 05/07/17  Yes Alphonzo Grieve, MD  Pseudoephedrine-DM-GG (ROBITUSSIN COLD & COUGH PO) Take 15 mLs by mouth daily as needed (cough).  Yes [provider]  ranitidine (ZANTAC) 150 MG tablet Take 1 tablet (150 mg total) by mouth 2 (two) times daily. 04/11/18 04/11/19 Yes [name], NP  atorvastatin (LIPITOR) 40 MG tablet Take 1 tablet (40 mg total) by mouth daily. Patient not taking: Reported on 04/30/2018 01/17/18   Neva Seat, MD  BD INSULIN SYRINGE U/F 30G X 1/2" 0.5 ML MISC USE AS DIRECTED 04/16/18   Neva Seat, MD  glucose blood test strip Use to check blood sugar 3 times daily. Dx code E11.65. Insulin dependent 04/17/18   Neva Seat, MD  Insulin Pen Needle (B-D UF III MINI PEN NEEDLES) 31G X 5 MM MISC USE TWICE A DAY 11/22/17   Neva Seat, MD    Family History Family History  Problem Relation Age of Onset  . Cancer Mother        colon cancer  . Multiple sclerosis Father   . Diabetes Father   . Asthma Sister        in childhood  . Hypertension Other     Social History Social History   Tobacco Use  . Smoking status: Current Some Day Smoker    Packs/day: 0.33     Years: 20.00    Pack years: 6.60    Types: Cigarettes    Start date: 10/05/2014  . Smokeless tobacco: Never Used  . Tobacco comment: 1 pack every 3 days  Substance Use Topics  . Alcohol use: No    Alcohol/week: 0.0 standard drinks  . Drug use: No     Allergies   Vicodin [hydrocodone-acetaminophen]   Review of Systems Review of Systems  Respiratory: Positive for cough and shortness of breath.   Gastrointestinal: Positive for abdominal distention and constipation.  All other systems reviewed and are negative.    Physical Exam Updated Vital Signs BP (!) 151/85   Pulse 86   Temp 98.2 F (36.8 C) (Oral)   Resp 18   Ht 5\' 1"  (1.549 m)   Wt 98.4 kg   SpO2 98%   BMI 41.00 kg/m   Physical Exam Vitals signs and nursing note reviewed.  Constitutional:      General: She is not in acute distress.    Appearance: She is well-developed.     Comments: Obese female who appears nontoxic.  HENT:     Head: Normocephalic and atraumatic.     Mouth/Throat:     Mouth: Mucous membranes are dry.     Comments: MM dry Eyes:     Conjunctiva/sclera: Conjunctivae normal.     Pupils: Pupils are equal, round, and reactive to light.  Neck:     Musculoskeletal: Normal range of motion and neck supple.  Cardiovascular:     Rate and Rhythm: Normal rate and regular rhythm.  Pulmonary:     Effort: Pulmonary effort is normal. No respiratory distress.     Breath sounds: Wheezing present.     Comments: Scattered wheezes. Speaking in full sentences. Trach in place.  Abdominal:     General: Bowel sounds are normal. There is no distension.     Palpations: Abdomen is soft. There is no mass.     Tenderness: There is no abdominal tenderness. There is no rebound.     Comments: No TTP. Soft without rigidity or distention. Negative rebound  Musculoskeletal: Normal range of motion.     Right lower leg: No edema.     Left lower leg: No edema.     Comments: No pitting edema  Skin:  General: Skin is  warm and dry.     Capillary Refill: Capillary refill takes less than 2 seconds.  Neurological:     Mental Status: She is alert and oriented to person, place, and time.      ED Treatments / Results  Labs (all labs ordered are listed, but only abnormal results are displayed) Labs Reviewed  CBC WITH DIFFERENTIAL/PLATELET - Abnormal; Notable for the following components:      Result Value   RBC 3.65 (*)    Hemoglobin 10.8 (*)    MCV 102.2 (*)    MCHC 29.0 (*)    All other components within normal limits  COMPREHENSIVE METABOLIC PANEL - Abnormal; Notable for the following components:   Potassium 6.9 (*)    Glucose, Bld 143 (*)    BUN 37 (*)    Creatinine, Ser 3.23 (*)    Calcium 8.6 (*)    Alkaline Phosphatase 209 (*)    GFR calc non Af Amer 16 (*)    GFR calc Af Amer 19 (*)    All other components within normal limits  POCT I-STAT, CHEM 8 - Abnormal; Notable for the following components:   Potassium 6.6 (*)    BUN 35 (*)    Creatinine, Ser 3.40 (*)    Glucose, Bld 102 (*)    Hemoglobin 11.9 (*)    HCT 35.0 (*)    All other components within normal limits  BRAIN NATRIURETIC PEPTIDE  MAGNESIUM  POTASSIUM  POTASSIUM  POTASSIUM  BASIC METABOLIC PANEL  CBC  SODIUM, URINE, RANDOM  CREATININE, URINE, RANDOM  URINALYSIS, ROUTINE W REFLEX MICROSCOPIC  UREA NITROGEN, URINE  I-STAT CHEM 8, ED    EKG EKG Interpretation  Date/Time:  Tuesday April 30 2018 19:08:36 EST Ventricular Rate:  85 PR Interval:    QRS Duration: 90 QT Interval:  368 QTC Calculation: 438 R Axis:   -46 Text Interpretation:  Sinus rhythm Left anterior fascicular block Consider anterior infarct peaked t waves similar to prior Confirmed by Deno Etienne [phone]) on 04/30/2018 8:33:12 PM   Radiology Dg Chest 2 View  Result Date: 04/30/2018 CLINICAL DATA:  Cough and abdominal distension for 1 week EXAM: CHEST - 2 VIEW COMPARISON:  November 04, 2017 FINDINGS: The mediastinal contour is normal. The heart  size is enlarged. There is central pulmonary vascular congestion. Tracheostomy tube is identified unchanged. Mild atelectasis of both lung bases are noted. There is no focal pneumonia, pleural effusion. The visualized skeletal structures are stable. IMPRESSION: Cardiomegaly with central pulmonary vascular congestion. No frank pulmonary edema is identified. Mild atelectasis of both lung bases. Electronically Signed   By: Abelardo Diesel M.D.   On: 04/30/2018 16:52   Dg Abd 1 View  Result Date: 04/30/2018 CLINICAL DATA:  Shortness of breath and abdominal distention EXAM: ABDOMEN - 1 VIEW COMPARISON:  09/06/2017 abdominal CT FINDINGS: Large liver correlating with 09/06/2017 abdominal CT. Fibrotic changes at the lung bases. No evidence of bowel obstruction. No concerning calcification. IMPRESSION: Normal bowel gas pattern. Electronically Signed   By: Monte Fantasia M.D.   On: 04/30/2018 20:28    Procedures .Critical Care Performed by: Franchot Heidelberg, PA-C Authorized by: Franchot Heidelberg, PA-C   Critical care provider statement:    Critical care time (minutes):  35   Critical care time was exclusive of:  Separately billable procedures and treating other patients and teaching time   Critical care was necessary to treat or prevent imminent or life-threatening deterioration of the following conditions:  Metabolic crisis   Critical care was time spent personally by me on the following activities:  Blood draw for specimens, development of treatment plan with patient or surrogate, evaluation of patient's response to treatment, examination of patient, obtaining history from patient or surrogate, ordering and performing treatments and interventions, ordering and review of laboratory studies, ordering and review of radiographic studies, pulse oximetry, re-evaluation of patient's condition and review of old charts   I assumed direction of critical care for this patient from another provider in my specialty: no    Comments:     Pt with critically high potassium. Nebulizer, insulin/glucose, and lasix given.    (including critical care time)  Medications Ordered in ED Medications  sodium chloride 0.9 % bolus 500 mL (500 mLs Intravenous New Bag/Given 04/30/18 2126)  amLODipine (NORVASC) tablet 10 mg (has no administration in time range)  carvedilol (COREG) tablet 6.25 mg (has no administration in time range)  famotidine (PEPCID) tablet 20 mg (has no administration in time range)  heparin injection 5,000 Units (has no administration in time range)  sodium chloride flush (NS) 0.9 % injection 3 mL (has no administration in time range)  sodium chloride flush (NS) 0.9 % injection 3 mL (3 mLs Intravenous Given 04/30/18 2128)  sodium chloride flush (NS) 0.9 % injection 3 mL (has no administration in time range)  0.9 %  sodium chloride infusion (has no administration in time range)  acetaminophen (TYLENOL) tablet 650 mg (has no administration in time range)    Or  acetaminophen (TYLENOL) suppository 650 mg (has no administration in time range)  ondansetron (ZOFRAN) tablet 4 mg (has no administration in time range)    Or  ondansetron (ZOFRAN) injection 4 mg (has no administration in time range)  insulin aspart (novoLOG) injection 0-9 Units (has no administration in time range)  insulin detemir (LEVEMIR) injection 20 Units (has no administration in time range)  insulin aspart (novoLOG) injection 0-5 Units (has no administration in time range)  insulin aspart (novoLOG) injection 5 Units (5 Units Intravenous Given 04/30/18 2125)    And  dextrose 50 % solution 50 mL (50 mLs Intravenous Given 04/30/18 2125)  albuterol (PROVENTIL) (2.5 MG/3ML) 0.083% nebulizer solution 10 mg (10 mg Nebulization Given 04/30/18 2126)  furosemide (LASIX) injection 20 mg (20 mg Intravenous Given 04/30/18 2126)     Initial Impression / Assessment and Plan / ED Course  I have reviewed the triage vital signs and the nursing  notes.  Pertinent labs & imaging results that were available during my care of the patient were reviewed by me and considered in my medical decision making (see chart for details).     Patient presenting for evaluation of cough, shortness of breath, and abdominal distention.  Concern for fluid overload.  On exam, patient appears nontoxic.  However, she does not appear fluid overloaded, if anything she appears dehydrated.  No leg swelling, which patient states is typical for her CHF exacerbations.  No rales or rhonchi, I do hear scattered wheezing.  Chest x-ray viewed interpreted by me, no pneumonia, thorax or effusion.  Will obtain labs, EKG, and [place] abdominal film for further evaluation of symptoms and to assess for possible constipation.  Favor constipation as cause of shortness of breath and abdominal distention at this time.  Abdominal x-ray viewed by me, no obvious constipation or obstruction.  Labs with critically high potassium at 6.9.  Additionally, patient with an AKI, 3.2 and baseline is 2.3.  Case discussed with attending, Dr.  Tyrone Nine evaluated the patient.  Will start treatment for hyperkalemia with nebulizer, insulin and glucose, and Lasix.  EKG with possible peaking of the T waves, although not severe or obvious, it appears similar to previous. As patient does appear dehydrated, will give a fluid bolus as well.  Will call for admission.  Discussed with Dr. Myna Hidalgo [facility], patient to be admitted.  Final Clinical Impressions(s) / ED Diagnoses   Final diagnoses:  Abdominal tightness  Hyperkalemia    ED Discharge Orders    None       Franchot Heidelberg, PA-C 04/30/18 2152    Deno Etienne, DO 04/30/18 2331

## 2018-04-30 NOTE — Addendum Note (Signed)
Encounter addended by: [name], NP on: 04/30/2018 2:48 PM  Actions taken: Problem List modified, Charge Capture section accepted

## 2018-04-30 NOTE — ED Notes (Signed)
ED TO INPATIENT HANDOFF REPORT  Name/Age/Gender Melody Gray 47 y.o. female  Code Status    Code Status Orders  (From admission, onward)         Start     Ordered   04/30/18 2117  Full code  Continuous     04/30/18 2120        Code Status History    Date Active Date Inactive Code Status Order ID Comments User Context   08/13/2014 0010 08/16/2014 1805 Full Code [id number]  Jolaine Artist, MD Inpatient   08/09/2014 0333 08/13/2014 0010 Full Code [id number]  Corky Sox, MD Inpatient   07/28/2014 1535 07/31/2014 2229 Full Code [id number]  Jessee Avers, MD Inpatient   05/05/2014 2035 05/09/2014 1525 Full Code [id number]  Juluis Mire, MD Inpatient   05/05/2014 1748 05/05/2014 2035 Full Code [id number]  Lucious Groves, DO Inpatient   11/22/2013 0129 11/26/2013 1824 Full Code [id number]  Frazier Richards, MD Inpatient   10/27/2013 1910 11/04/2013 1735 Full Code [id number]  Germain Osgood, PA-C ED   08/15/2013 2152 09/03/2013 1458 Full Code [id number]  Howell Rucks Inpatient   08/10/2013 1110 08/15/2013 2152 Full Code [id number]  Elsie Stain, MD Inpatient   07/20/2013 0138 07/21/2013 1912 Full Code [id number]  Jessee Avers, MD Inpatient   11/09/2011 1545 11/11/2011 0549 Full Code [id number]  Waldemar Dickens, MD Inpatient   05/10/2011 0705 05/11/2011 1538 Full Code [id number]  Michaela Corner, RN Inpatient      Home/SNF/Other Home  Chief Complaint O2 dropping on oxygen machine  Level of Care/Admitting Diagnosis ED Disposition    ED Disposition Condition [place] [facility] Area: [facility] [[id number]]  Level of Care: Telemetry [5]  Admit to tele based on following criteria: Other see comments  Comments: hyperkalemia  Diagnosis: Hyperkalemia [631497]  Admitting Physician: Vianne Bulls [[id number]]  Attending Physician: Vianne Bulls [[id number]]  PT Class (Do Not Modify): Observation [104]  PT Acc Code (Do Not Modify): Observation [10022]        Medical History Past Medical History:  Diagnosis Date  . Abnormal LFTs (liver function tests)    Liver U/S and exam c/w HSM. Hep B serology neg. but Hep C ab +, HIV neg. 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 Acute bronchitis 07/29/2014  . Anemia   . Cardiomyopathy, nonischemic ([place])    a. Varying EF over the years - initially 35% in 09/2010. Normal cors 12/2010. b. Echo 07/2014: EF 55-60%, no RWMA, + diastolic flattening and systolic flattening c/w RV volume and pressure overload, mild LAE/RAE, mod dilated RV, mod TR, PASP severely increased at 57mmHg. c. RHC 07/2014: moderate pulmonary HTN likely WHO group 3 with markedly elevated CVP and relatively normal left sided pressures.  . Chronic diastolic CHF (congestive heart failure) ([place])   . Chronic respiratory failure ([place])    a. became O2 dependent in July 2011. b. She required trache placement in 07/2013 and has been followed by pulmonology as well.  . CKD (chronic kidney disease), stage II   . Complication of anesthesia    " difficult waking "  . Diabetes mellitus    insulin dependent  . Diabetic retinopathy    Right eye 2/11  . Essential hypertension   . Health maintenance examination    Mammogram 05/2010 Negative; Last Pap smear 03/2008; Last DM eye exam 2/11> mild non-proliferative diabetic retinopathy.  OD  . Helicobacter pylori ab+ 05/2011   Pt was symptomatic and treatment planned for 05/2011  . Hx of cardiac cath 2/08   No CAD, no RAS,  normal EF  . Hypokalemia   . Hypomagnesemia   . Hypoxemia    CT angiogram 9/11>> No PE; PFTs 10/11- FEV1 1.20 (49%) with 16% better p B2, DLCO 33%> corrects to 84; O2 sats ok on 4 lpm X rapid walk X 3 laps 05/2010  . Long term current use of systemic steroids   . Morbid obesity ([place])    Target wt= 153 for BMI <30  . Obesity hypoventilation syndrome ([place])   . Pulmonary hypertension ([place])   . QT prolongation   . Sarcoidosis    Followed by Dr.  Melvyn Novas; w/ liver involvement per biopsy 12/09, Reversible airway component so started on [facility] 01/2010; HFA 75% p coaching 05/2010  . Seborrheic dermatitis of scalp   . Sleep apnea   . Tobacco abuse     Allergies Allergies  Allergen Reactions  . Vicodin [Hydrocodone-Acetaminophen] Itching    IV Location/Drains/Wounds Patient Lines/Drains/Airways Status   Active Line/Drains/Airways    Name:   Placement date:   Placement time:   Site:   Days:   Peripheral IV 04/30/18 Right Antecubital   04/30/18    1905    Antecubital   less than 1   Tracheostomy [place] 6 mm Uncuffed   08/08/14    -    6 mm   1361   Tracheostomy [place] 4 mm Uncuffed   01/08/15    -    4 mm   1208          Labs/Imaging Results for orders placed or performed during the hospital encounter of 04/30/18 (from the past 48 hour(s))  CBC with Differential     Status: Abnormal   Collection Time: 04/30/18  7:02 PM  Result Value Ref Range   WBC 4.0 4.0 - 10.5 K/uL   RBC 3.65 (L) 3.87 - 5.11 MIL/uL   Hemoglobin 10.8 (L) 12.0 - 15.0 g/dL   HCT 37.3 36.0 - 46.0 %   MCV 102.2 (H) 80.0 - 100.0 fL   MCH 29.6 26.0 - 34.0 pg   MCHC 29.0 (L) 30.0 - 36.0 g/dL   RDW 12.8 11.5 - 15.5 %   Platelets 162 150 - 400 K/uL   nRBC 0.0 0.0 - 0.2 %   Neutrophils Relative % 44 %   Neutro Abs 1.8 1.7 - 7.7 K/uL   Lymphocytes Relative 33 %   Lymphs Abs 1.3 0.7 - 4.0 K/uL   Monocytes Relative 16 %   Monocytes Absolute 0.7 0.1 - 1.0 K/uL   Eosinophils Relative 6 %   Eosinophils Absolute 0.2 0.0 - 0.5 K/uL   Basophils Relative 0 %   Basophils Absolute 0.0 0.0 - 0.1 K/uL   Immature Granulocytes 1 %   Abs Immature Granulocytes 0.02 0.00 - 0.07 K/uL    Comment: Performed at [facility], [place] [address], [place], [place] [address]  Comprehensive metabolic panel     Status: Abnormal   Collection Time: 04/30/18  7:02 PM  Result Value Ref Range   Sodium 140 135 - 145 mmol/L   Potassium 6.9 (HH) 3.5 - 5.1 mmol/L    Comment:  CRITICAL RESULT CALLED TO, READ BACK BY AND VERIFIED WITH: Vikki Ports RN 2028 04/30/18 A NAVARRO NO VISIBLE HEMOLYSIS    Chloride 109 98 - 111 mmol/L   CO2 26  22 - 32 mmol/L   Glucose, Bld 143 (H) 70 - 99 mg/dL   BUN 37 (H) 6 - 20 mg/dL   Creatinine, Ser 3.23 (H) 0.44 - 1.00 mg/dL   Calcium 8.6 (L) 8.9 - 10.3 mg/dL   Total Protein 7.5 6.5 - 8.1 g/dL   Albumin 3.5 3.5 - 5.0 g/dL   AST 28 15 - 41 U/L   ALT 24 0 - 44 U/L   Alkaline Phosphatase 209 (H) 38 - 126 U/L   Total Bilirubin 0.3 0.3 - 1.2 mg/dL   GFR calc non Af Amer 16 (L) >60 mL/min   GFR calc Af Amer 19 (L) >60 mL/min   Anion gap 5 5 - 15    Comment: Performed at [facility], [place] [address], [place], [place] [address]  Brain natriuretic peptide     Status: None   Collection Time: 04/30/18  7:02 PM  Result Value Ref Range   B Natriuretic Peptide 80.6 0.0 - 100.0 pg/mL    Comment: Performed at [facility], [place] [address] [place], [place] [address]  Magnesium     Status: None   Collection Time: 04/30/18  8:35 PM  Result Value Ref Range   Magnesium 2.4 1.7 - 2.4 mg/dL    Comment: Performed at [facility], [place] [address] [place], [place] [address]  I-STAT, [place] Cl[place] [address]     Status: Abnormal   Collection Time: 04/30/18  9:16 PM  Result Value Ref Range   Sodium 139 135 - 145 mmol/L   Potassium 6.6 (HH) 3.5 - 5.1 mmol/L   Chloride 109 98 - 111 mmol/L   BUN 35 (H) 6 - 20 mg/dL   Creatinine, Ser 3.40 (H) 0.44 - 1.00 mg/dL   Glucose, Bld 102 (H) 70 - 99 mg/dL   Calcium, Ion 1.20 1.15 - 1.40 mmol/L   TCO2 27 22 - 32 mmol/L   Hemoglobin 11.9 (L) 12.0 - 15.0 g/dL   HCT 35.0 (L) 36.0 - 46.0 %   Comment NOTIFIED PHYSICIAN   Glucose, capillary     Status: Abnormal   Collection Time: 04/30/18 10:03 PM  Result Value Ref Range   Glucose-Capillary 118 (H) 70 - 99 mg/dL   Comment 1 Notify RN    Dg Chest 2 View  Result Date: 04/30/2018 CLINICAL DATA:  Cough and abdominal  distension for 1 week EXAM: CHEST - 2 VIEW COMPARISON:  November 04, 2017 FINDINGS: The mediastinal contour is normal. The heart size is enlarged. There is central pulmonary vascular congestion. Tracheostomy tube is identified unchanged. Mild atelectasis of both lung bases are noted. There is no focal pneumonia, pleural effusion. The visualized skeletal structures are stable. IMPRESSION: Cardiomegaly with central pulmonary vascular congestion. No frank pulmonary edema is identified. Mild atelectasis of both lung bases. Electronically Signed   By: Abelardo Diesel M.D.   On: 04/30/2018 16:52   Dg Abd 1 View  Result Date: 04/30/2018 CLINICAL DATA:  Shortness of breath and abdominal distention EXAM: ABDOMEN - 1 VIEW COMPARISON:  09/06/2017 abdominal CT FINDINGS: Large liver correlating with 09/06/2017 abdominal CT. Fibrotic changes at the lung bases. No evidence of bowel obstruction. No concerning calcification. IMPRESSION: Normal bowel gas pattern. Electronically Signed   By: Monte Fantasia M.D.   On: 04/30/2018 20:28   EKG Interpretation  Date/Time:  Tuesday April 30 2018 19:08:36 EST Ventricular Rate:  85 PR Interval:    QRS Duration: 90 QT Interval:  368 QTC Calculation: 438  R Axis:   -46 Text Interpretation:  Sinus rhythm Left anterior fascicular block Consider anterior infarct peaked t waves similar to prior Confirmed by Deno Etienne [phone]) on 04/30/2018 8:33:12 PM   Pending Labs Unresulted Labs (From admission, onward)    Start     Ordered   05/01/18 4332  Basic metabolic panel  Tomorrow morning,   R     04/30/18 2120   05/01/18 0500  CBC  Tomorrow morning,   R     04/30/18 2120   04/30/18 2119  Sodium, urine, random  ONCE - STAT,   STAT     04/30/18 2120   04/30/18 2119  Creatinine, urine, random  ONCE - STAT,   STAT     04/30/18 2120   04/30/18 2119  Urinalysis, Routine w reflex microscopic  Once,   R     04/30/18 2120   04/30/18 2119  Urea nitrogen, urine  Once,   R     04/30/18  2120   04/30/18 2117  Potassium  Now then every 4 hours,   STAT    Comments:  Until normal twice.    04/30/18 2120          Vitals/Pain Today's Vitals   04/30/18 1856 04/30/18 2100 04/30/18 2136 04/30/18 2204  BP:  (!) 151/85    Pulse:  86    Resp:  18  18  Temp: 98.2 F (36.8 C)     TempSrc: Oral     SpO2:  98% 99%   Weight: 98.4 kg     Height: 5\' 1"  (1.549 m)     PainSc:        Isolation Precautions No active isolations  Medications Medications  amLODipine (NORVASC) tablet 10 mg (has no administration in time range)  carvedilol (COREG) tablet 6.25 mg (has no administration in time range)  heparin injection 5,000 Units (has no administration in time range)  sodium chloride flush (NS) 0.9 % injection 3 mL (has no administration in time range)  sodium chloride flush (NS) 0.9 % injection 3 mL (3 mLs Intravenous Given 04/30/18 2128)  sodium chloride flush (NS) 0.9 % injection 3 mL (has no administration in time range)  0.9 %  sodium chloride infusion (has no administration in time range)  acetaminophen (TYLENOL) tablet 650 mg (has no administration in time range)    Or  acetaminophen (TYLENOL) suppository 650 mg (has no administration in time range)  ondansetron (ZOFRAN) tablet 4 mg (has no administration in time range)    Or  ondansetron (ZOFRAN) injection 4 mg (has no administration in time range)  insulin aspart (novoLOG) injection 0-9 Units (has no administration in time range)  insulin detemir (LEVEMIR) injection 20 Units (has no administration in time range)  insulin aspart (novoLOG) injection 0-5 Units (has no administration in time range)  0.9 %  sodium chloride infusion (has no administration in time range)  famotidine (PEPCID) tablet 20 mg (has no administration in time range)  albuterol (PROVENTIL) (2.5 MG/3ML) 0.083% nebulizer solution 2.5 mg (has no administration in time range)  insulin aspart (novoLOG) injection 5 Units (5 Units Intravenous Given 04/30/18  2125)    And  dextrose 50 % solution 50 mL (50 mLs Intravenous Given 04/30/18 2125)  albuterol (PROVENTIL) (2.5 MG/3ML) 0.083% nebulizer solution 10 mg (10 mg Nebulization Given 04/30/18 2126)  sodium chloride 0.9 % bolus 500 mL (0 mLs Intravenous Stopped 04/30/18 2212)  furosemide (LASIX) injection 20 mg (20 mg Intravenous Given 04/30/18 2126)  Mobility walks  

## 2018-05-01 ENCOUNTER — Other Ambulatory Visit: Payer: Self-pay

## 2018-05-01 DIAGNOSIS — K922 Gastrointestinal hemorrhage, unspecified: Secondary | ICD-10-CM | POA: Diagnosis not present

## 2018-05-01 DIAGNOSIS — Z794 Long term (current) use of insulin: Secondary | ICD-10-CM | POA: Diagnosis not present

## 2018-05-01 DIAGNOSIS — K2951 Unspecified chronic gastritis with bleeding: Secondary | ICD-10-CM | POA: Diagnosis not present

## 2018-05-01 DIAGNOSIS — J9690 Respiratory failure, unspecified, unspecified whether with hypoxia or hypercapnia: Secondary | ICD-10-CM | POA: Diagnosis not present

## 2018-05-01 DIAGNOSIS — J9601 Acute respiratory failure with hypoxia: Secondary | ICD-10-CM | POA: Diagnosis not present

## 2018-05-01 DIAGNOSIS — I998 Other disorder of circulatory system: Secondary | ICD-10-CM | POA: Diagnosis not present

## 2018-05-01 DIAGNOSIS — K7460 Unspecified cirrhosis of liver: Secondary | ICD-10-CM | POA: Diagnosis not present

## 2018-05-01 DIAGNOSIS — E875 Hyperkalemia: Secondary | ICD-10-CM | POA: Diagnosis not present

## 2018-05-01 DIAGNOSIS — N281 Cyst of kidney, acquired: Secondary | ICD-10-CM | POA: Diagnosis not present

## 2018-05-01 DIAGNOSIS — N179 Acute kidney failure, unspecified: Secondary | ICD-10-CM | POA: Diagnosis not present

## 2018-05-01 DIAGNOSIS — N170 Acute kidney failure with tubular necrosis: Secondary | ICD-10-CM | POA: Diagnosis not present

## 2018-05-01 DIAGNOSIS — D8689 Sarcoidosis of other sites: Secondary | ICD-10-CM | POA: Diagnosis not present

## 2018-05-01 DIAGNOSIS — I10 Essential (primary) hypertension: Secondary | ICD-10-CM | POA: Diagnosis not present

## 2018-05-01 DIAGNOSIS — N183 Chronic kidney disease, stage 3 (moderate): Secondary | ICD-10-CM | POA: Diagnosis not present

## 2018-05-01 DIAGNOSIS — I110 Hypertensive heart disease with heart failure: Secondary | ICD-10-CM | POA: Diagnosis not present

## 2018-05-01 DIAGNOSIS — R0602 Shortness of breath: Secondary | ICD-10-CM | POA: Diagnosis not present

## 2018-05-01 DIAGNOSIS — I428 Other cardiomyopathies: Secondary | ICD-10-CM | POA: Diagnosis not present

## 2018-05-01 DIAGNOSIS — J9602 Acute respiratory failure with hypercapnia: Secondary | ICD-10-CM | POA: Diagnosis not present

## 2018-05-01 DIAGNOSIS — N184 Chronic kidney disease, stage 4 (severe): Secondary | ICD-10-CM | POA: Diagnosis not present

## 2018-05-01 DIAGNOSIS — R0789 Other chest pain: Secondary | ICD-10-CM | POA: Diagnosis not present

## 2018-05-01 DIAGNOSIS — Y95 Nosocomial condition: Secondary | ICD-10-CM | POA: Diagnosis not present

## 2018-05-01 DIAGNOSIS — J181 Lobar pneumonia, unspecified organism: Secondary | ICD-10-CM | POA: Diagnosis not present

## 2018-05-01 DIAGNOSIS — Z885 Allergy status to narcotic agent status: Secondary | ICD-10-CM | POA: Diagnosis not present

## 2018-05-01 DIAGNOSIS — D62 Acute posthemorrhagic anemia: Secondary | ICD-10-CM | POA: Diagnosis not present

## 2018-05-01 DIAGNOSIS — I5033 Acute on chronic diastolic (congestive) heart failure: Secondary | ICD-10-CM | POA: Diagnosis not present

## 2018-05-01 DIAGNOSIS — T884XXA Failed or difficult intubation, initial encounter: Secondary | ICD-10-CM | POA: Diagnosis not present

## 2018-05-01 DIAGNOSIS — M79A22 Nontraumatic compartment syndrome of left lower extremity: Secondary | ICD-10-CM | POA: Diagnosis not present

## 2018-05-01 DIAGNOSIS — I2729 Other secondary pulmonary hypertension: Secondary | ICD-10-CM | POA: Diagnosis present

## 2018-05-01 DIAGNOSIS — F17210 Nicotine dependence, cigarettes, uncomplicated: Secondary | ICD-10-CM | POA: Diagnosis not present

## 2018-05-01 DIAGNOSIS — Z430 Encounter for attention to tracheostomy: Secondary | ICD-10-CM | POA: Diagnosis not present

## 2018-05-01 DIAGNOSIS — R918 Other nonspecific abnormal finding of lung field: Secondary | ICD-10-CM | POA: Diagnosis not present

## 2018-05-01 DIAGNOSIS — N185 Chronic kidney disease, stage 5: Secondary | ICD-10-CM | POA: Diagnosis present

## 2018-05-01 DIAGNOSIS — D869 Sarcoidosis, unspecified: Secondary | ICD-10-CM | POA: Diagnosis not present

## 2018-05-01 DIAGNOSIS — I272 Pulmonary hypertension, unspecified: Secondary | ICD-10-CM | POA: Diagnosis not present

## 2018-05-01 DIAGNOSIS — E662 Morbid (severe) obesity with alveolar hypoventilation: Secondary | ICD-10-CM | POA: Diagnosis not present

## 2018-05-01 DIAGNOSIS — J15212 Pneumonia due to Methicillin resistant Staphylococcus aureus: Secondary | ICD-10-CM | POA: Diagnosis not present

## 2018-05-01 DIAGNOSIS — E1122 Type 2 diabetes mellitus with diabetic chronic kidney disease: Secondary | ICD-10-CM | POA: Diagnosis not present

## 2018-05-01 DIAGNOSIS — I132 Hypertensive heart and chronic kidney disease with heart failure and with stage 5 chronic kidney disease, or end stage renal disease: Secondary | ICD-10-CM | POA: Diagnosis not present

## 2018-05-01 DIAGNOSIS — K2971 Gastritis, unspecified, with bleeding: Secondary | ICD-10-CM | POA: Diagnosis not present

## 2018-05-01 DIAGNOSIS — N2889 Other specified disorders of kidney and ureter: Secondary | ICD-10-CM | POA: Diagnosis not present

## 2018-05-01 DIAGNOSIS — K254 Chronic or unspecified gastric ulcer with hemorrhage: Secondary | ICD-10-CM | POA: Diagnosis not present

## 2018-05-01 DIAGNOSIS — G4733 Obstructive sleep apnea (adult) (pediatric): Secondary | ICD-10-CM | POA: Diagnosis not present

## 2018-05-01 DIAGNOSIS — J9621 Acute and chronic respiratory failure with hypoxia: Secondary | ICD-10-CM | POA: Diagnosis not present

## 2018-05-01 DIAGNOSIS — R161 Splenomegaly, not elsewhere classified: Secondary | ICD-10-CM | POA: Diagnosis not present

## 2018-05-01 DIAGNOSIS — M940 Chondrocostal junction syndrome [Tietze]: Secondary | ICD-10-CM | POA: Diagnosis not present

## 2018-05-01 DIAGNOSIS — I2721 Secondary pulmonary arterial hypertension: Secondary | ICD-10-CM | POA: Diagnosis present

## 2018-05-01 DIAGNOSIS — J398 Other specified diseases of upper respiratory tract: Secondary | ICD-10-CM | POA: Diagnosis present

## 2018-05-01 DIAGNOSIS — B3781 Candidal esophagitis: Secondary | ICD-10-CM | POA: Diagnosis not present

## 2018-05-01 DIAGNOSIS — B9562 Methicillin resistant Staphylococcus aureus infection as the cause of diseases classified elsewhere: Secondary | ICD-10-CM | POA: Diagnosis not present

## 2018-05-01 DIAGNOSIS — E1159 Type 2 diabetes mellitus with other circulatory complications: Secondary | ICD-10-CM | POA: Diagnosis not present

## 2018-05-01 DIAGNOSIS — Z9911 Dependence on respirator [ventilator] status: Secondary | ICD-10-CM | POA: Diagnosis not present

## 2018-05-01 DIAGNOSIS — I5032 Chronic diastolic (congestive) heart failure: Secondary | ICD-10-CM | POA: Diagnosis not present

## 2018-05-01 DIAGNOSIS — M7981 Nontraumatic hematoma of soft tissue: Secondary | ICD-10-CM | POA: Diagnosis not present

## 2018-05-01 DIAGNOSIS — Z930 Tracheostomy status: Secondary | ICD-10-CM | POA: Diagnosis not present

## 2018-05-01 DIAGNOSIS — R579 Shock, unspecified: Secondary | ICD-10-CM | POA: Diagnosis not present

## 2018-05-01 DIAGNOSIS — Y92230 Patient room in hospital as the place of occurrence of the external cause: Secondary | ICD-10-CM | POA: Diagnosis not present

## 2018-05-01 DIAGNOSIS — Z6841 Body Mass Index (BMI) 40.0 and over, adult: Secondary | ICD-10-CM | POA: Diagnosis not present

## 2018-05-01 DIAGNOSIS — J690 Pneumonitis due to inhalation of food and vomit: Secondary | ICD-10-CM | POA: Diagnosis not present

## 2018-05-01 DIAGNOSIS — Z452 Encounter for adjustment and management of vascular access device: Secondary | ICD-10-CM | POA: Diagnosis not present

## 2018-05-01 DIAGNOSIS — E119 Type 2 diabetes mellitus without complications: Secondary | ICD-10-CM | POA: Diagnosis not present

## 2018-05-01 DIAGNOSIS — R7881 Bacteremia: Secondary | ICD-10-CM | POA: Diagnosis not present

## 2018-05-01 DIAGNOSIS — I2781 Cor pulmonale (chronic): Secondary | ICD-10-CM | POA: Diagnosis present

## 2018-05-01 DIAGNOSIS — D631 Anemia in chronic kidney disease: Secondary | ICD-10-CM | POA: Diagnosis present

## 2018-05-01 DIAGNOSIS — Y828 Other medical devices associated with adverse incidents: Secondary | ICD-10-CM | POA: Diagnosis not present

## 2018-05-01 DIAGNOSIS — J189 Pneumonia, unspecified organism: Secondary | ICD-10-CM | POA: Diagnosis not present

## 2018-05-01 DIAGNOSIS — Z8674 Personal history of sudden cardiac arrest: Secondary | ICD-10-CM | POA: Diagnosis not present

## 2018-05-01 DIAGNOSIS — N189 Chronic kidney disease, unspecified: Secondary | ICD-10-CM | POA: Diagnosis not present

## 2018-05-01 DIAGNOSIS — J9622 Acute and chronic respiratory failure with hypercapnia: Secondary | ICD-10-CM | POA: Diagnosis not present

## 2018-05-01 DIAGNOSIS — I469 Cardiac arrest, cause unspecified: Secondary | ICD-10-CM | POA: Diagnosis not present

## 2018-05-01 DIAGNOSIS — E872 Acidosis: Secondary | ICD-10-CM | POA: Diagnosis not present

## 2018-05-01 DIAGNOSIS — M109 Gout, unspecified: Secondary | ICD-10-CM | POA: Diagnosis not present

## 2018-05-01 DIAGNOSIS — R198 Other specified symptoms and signs involving the digestive system and abdomen: Secondary | ICD-10-CM | POA: Diagnosis not present

## 2018-05-01 DIAGNOSIS — T79A22A Traumatic compartment syndrome of left lower extremity, initial encounter: Secondary | ICD-10-CM | POA: Diagnosis not present

## 2018-05-01 DIAGNOSIS — J9611 Chronic respiratory failure with hypoxia: Secondary | ICD-10-CM | POA: Diagnosis not present

## 2018-05-01 DIAGNOSIS — Z4682 Encounter for fitting and adjustment of non-vascular catheter: Secondary | ICD-10-CM | POA: Diagnosis not present

## 2018-05-01 DIAGNOSIS — I130 Hypertensive heart and chronic kidney disease with heart failure and stage 1 through stage 4 chronic kidney disease, or unspecified chronic kidney disease: Secondary | ICD-10-CM | POA: Diagnosis not present

## 2018-05-01 DIAGNOSIS — E113291 Type 2 diabetes mellitus with mild nonproliferative diabetic retinopathy without macular edema, right eye: Secondary | ICD-10-CM | POA: Diagnosis present

## 2018-05-01 DIAGNOSIS — K229 Disease of esophagus, unspecified: Secondary | ICD-10-CM | POA: Diagnosis not present

## 2018-05-01 DIAGNOSIS — K5900 Constipation, unspecified: Secondary | ICD-10-CM | POA: Diagnosis not present

## 2018-05-01 LAB — BASIC METABOLIC PANEL
Anion gap: 6 (ref 5–15)
BUN: 38 mg/dL — ABNORMAL HIGH (ref 6–20)
CO2: 24 mmol/L (ref 22–32)
Calcium: 8.7 mg/dL — ABNORMAL LOW (ref 8.9–10.3)
Chloride: 111 mmol/L (ref 98–111)
Creatinine, Ser: 3.22 mg/dL — ABNORMAL HIGH (ref 0.44–1.00)
GFR calc Af Amer: 19 mL/min — ABNORMAL LOW (ref >60)
GFR calc non Af Amer: 16 mL/min — ABNORMAL LOW (ref >60)
Glucose, Bld: 202 mg/dL — ABNORMAL HIGH (ref 70–99)
Potassium: 6.6 mmol/L (ref 3.5–5.1)
Sodium: 141 mmol/L (ref 135–145)

## 2018-05-01 LAB — CBC
HCT: 36.2 % (ref 36.0–46.0)
Hemoglobin: 10.3 g/dL — ABNORMAL LOW (ref 12.0–15.0)
MCH: 29.4 pg (ref 26.0–34.0)
MCHC: 28.5 g/dL — ABNORMAL LOW (ref 30.0–36.0)
MCV: 103.4 fL — ABNORMAL HIGH (ref 80.0–100.0)
Platelets: 136 10*3/uL — ABNORMAL LOW (ref 150–400)
RBC: 3.5 MIL/uL — ABNORMAL LOW (ref 3.87–5.11)
RDW: 12.7 % (ref 11.5–15.5)
WBC: 4.7 10*3/uL (ref 4.0–10.5)
nRBC: 0 % (ref 0.0–0.2)

## 2018-05-01 LAB — GLUCOSE, CAPILLARY
Glucose-Capillary: 149 mg/dL — ABNORMAL HIGH (ref 70–99)
Glucose-Capillary: 141 mg/dL — ABNORMAL HIGH (ref 70–99)
Glucose-Capillary: 254 mg/dL — ABNORMAL HIGH (ref 70–99)
Glucose-Capillary: 106 mg/dL — ABNORMAL HIGH (ref 70–99)

## 2018-05-01 LAB — MRSA PCR SCREENING: MRSA by PCR: NEGATIVE

## 2018-05-01 LAB — POTASSIUM
Potassium: 6.8 mmol/L (ref 3.5–5.1)
Potassium: 7 mmol/L (ref 3.5–5.1)
Potassium: 5.9 mmol/L — ABNORMAL HIGH (ref 3.5–5.1)

## 2018-05-01 MED ORDER — CALCIUM GLUCONATE-NACL 1-0.675 GM/50ML-% IV SOLN
1.0000 g | Freq: Once | INTRAVENOUS | Status: AC
  Administered 2018-05-01: 1000 mg via INTRAVENOUS
  Filled 2018-05-01: qty 50

## 2018-05-01 MED ORDER — ORAL CARE MOUTH RINSE
15.0000 mL | Freq: Two times a day (BID) | OROMUCOSAL | Status: DC
  Administered 2018-05-01 – 2018-05-03 (×5): 15 mL via OROMUCOSAL

## 2018-05-01 MED ORDER — SODIUM CHLORIDE 0.9 % IV SOLN: INTRAVENOUS | Status: AC

## 2018-05-01 MED ORDER — DEXTROSE 50 % IV SOLN
1.0000 | Freq: Once | INTRAVENOUS | Status: AC
  Administered 2018-05-01: 50 mL via INTRAVENOUS
  Filled 2018-05-01: qty 50

## 2018-05-01 MED ORDER — INSULIN ASPART 100 UNIT/ML IV SOLN
10.0000 [IU] | Freq: Once | INTRAVENOUS | Status: AC
  Administered 2018-05-01: 10 [IU] via INTRAVENOUS

## 2018-05-01 MED ORDER — CHLORHEXIDINE GLUCONATE 0.12 % MT SOLN
15.0000 mL | Freq: Two times a day (BID) | OROMUCOSAL | Status: DC
  Administered 2018-05-01 – 2018-05-03 (×6): 15 mL via OROMUCOSAL
  Filled 2018-05-01 (×6): qty 15

## 2018-05-01 MED ORDER — SODIUM POLYSTYRENE SULFONATE 15 GM/60ML PO SUSP
30.0000 g | Freq: Four times a day (QID) | ORAL | Status: AC
  Administered 2018-05-01 (×2): 30 g via ORAL
  Filled 2018-05-01 (×2): qty 120

## 2018-05-01 NOTE — Progress Notes (Signed)
CRITICAL VALUE STICKER  CRITICAL VALUE: K+ 6.3  RECEIVER (on-site recipient of call): Myriam Jacobson, [place] NOTIFIED: 04/30/2018  2354  MESSENGER (representative from lab): Amy  MD NOTIFIED: Baltazar Najjar  TIME OF NOTIFICATION: 0057  RESPONSE: Awaiting response

## 2018-05-01 NOTE — Progress Notes (Signed)
CRITICAL VALUE STICKER  CRITICAL VALUE: K+ 6.6  RECEIVER (on-site recipient of call): Myriam Jacobson, [place] NOTIFIED: 05/01/2018 0701  MESSENGER (representative from lab): Tammy  MD NOTIFIED: [place]  TIME OF NOTIFICATION: 0702  RESPONSE: Awaiting new orders

## 2018-05-01 NOTE — Progress Notes (Signed)
CRITICAL VALUE STICKER  CRITICAL VALUE: K+ 6.8  RECEIVER (on-site recipient of call): Myriam Jacobson, [place] NOTIFIED: 05/01/2018 0125  MESSENGER (representative from lab): Amy  MD NOTIFIED: Baltazar Najjar  TIME OF NOTIFICATION: 0126  RESPONSE: Will carry out previous orders.

## 2018-05-01 NOTE — Progress Notes (Signed)
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  Sarcoidosis diagnosed 2001-O2 dependent 10/2009-pulmonary arterial hypertension OHS requiring trach 07/2013 Right heart cath 08/11/2013 showing elevated CVP  Admit for cough increased dyspnea anorexia X 2 weeks on 12/31 Found to have new onset AKI creatinine 3.2 up from 2.7 potassium 6.9-Rx in the emergency room IV Lasix IV fluids insulin dextrose  A & Plan AKI superimposed on CKD 3-4-3 is unclear patient is passing reasonable amounts of urine however has not given a specimen for urinary studies-given she is passing urine fairly well doubt that this is postobstructive in etiology probably ATN from volume depletion from not eating and drinking for the past couple of weeks because of upper respiratory infection and cough-hopeful with saline that her kidney function tends to trend better Hyperkalemia-stop checking every 4 hourly potassium is checked twice daily given Kayexalate 30 mg x 2 doses and observe trend and potassium Pulmonary hypertension continue Coreg 6.25 twice daily amlodipine 10 daily and will need outpatient therapy and treatment Cirrhosis?  2/2 sarcoid-needs weight loss and monitoring BMI is 41 which is likely the cause of her fatty liver and cirrhosis Insulin-dependent DM last A1c 9.512/2019-demonstrating in hospital reasonable control 142/200-continue Lantus 20 units and sliding scale coverage-holding glipizide 0.5 but resume Victoza Chronic respiratory failure requiring trach-follows as an outpatient will need trach care   Melody Peeters, MD  [facility] Direct contact: [phone] --Via amion app OR  --[email or web address]; password [email or web address]  7PM-7AM contact night coverage as above 05/01/2018, 7:27 AM  LOS: 0 days   Consultants:  Pulmonary  Procedures:  No  Antimicrobials:  no  Interval  history/Subjective: Awake alert standing at the bedside no distress feels a little better than previously passing good urine had 1 stool no chest pain at this time no fever no cough  Objective:  Vitals:  Vitals:   05/01/18 0321 05/01/18 0416  BP:  129/69  Pulse: 99 90  Resp: (!) 23 16  Temp:  98.7 F (37.1 C)  SpO2: 95% 95%    Exam:  Awake alert pleasant no distress standing on the bedside Chest is clear no added sound Abdomen is soft nontender no rebound no guarding Neurologically intact cranial nerves II through XII grossly intact power is 5/5 sensory is intact [name] is in place she phonates well I did not examine the stoma Extraocular movements are intact Sensory is grossly intact   I have personally reviewed the following:  DATA   Labs:  BUN/creatinine 38/3.2 down from 35/3.4, potassium 6.6  Hemoglobin 10.3 MCV 103 platelet 136  Scheduled Meds: . amLODipine  10 mg Oral Daily  . carvedilol  6.25 mg Oral BID  . chlorhexidine  15 mL Mouth Rinse BID  . famotidine  20 mg Oral Daily  . heparin  5,000 Units Subcutaneous Q8H  . insulin aspart  0-5 Units Subcutaneous QHS  . insulin aspart  0-9 Units Subcutaneous TID WC  . insulin detemir  20 Units Subcutaneous QHS  . mouth rinse  15 mL Mouth Rinse q12n4p  . sodium chloride flush  3 mL Intravenous Q12H  . sodium chloride flush  3 mL Intravenous Q12H  . sodium polystyrene  30 g Oral Q6H   Continuous Infusions: . sodium chloride    . sodium chloride 75 mL/hr at 05/01/18 0600    Principal Problem:   Hyperkalemia Active Problems:   Lesion of liver  and cirrhosis on imaging   Insulin-requiring or dependent type II diabetes mellitus ([place])   Hypertension complicating diabetes ([place])   Acute renal failure superimposed on stage 4 chronic kidney disease (HCC)   Chronic respiratory failure with hypoxia (HCC)/ trach dep   Chronic pulmonary hypertension ([place])   LOS: 0 days

## 2018-05-01 NOTE — Progress Notes (Signed)
CRITICAL VALUE ALERT  Critical Value:  Potassium 7.0  Date & Time Notied:  05/01/2018 at 1042  Provider Notified: MD Verlon Au  Orders Received/Actions taken: Awaiting new orders.

## 2018-05-02 ENCOUNTER — Inpatient Hospital Stay (HOSPITAL_COMMUNITY): Payer: Medicare Other

## 2018-05-02 LAB — CBC WITH DIFFERENTIAL/PLATELET
Abs Immature Granulocytes: 0.1 10*3/uL — ABNORMAL HIGH (ref 0.00–0.07)
Basophils Absolute: 0 10*3/uL (ref 0.0–0.1)
Basophils Relative: 0 %
Eosinophils Absolute: 0.2 10*3/uL (ref 0.0–0.5)
Eosinophils Relative: 2 %
HCT: 35.2 % — ABNORMAL LOW (ref 36.0–46.0)
Hemoglobin: 9.9 g/dL — ABNORMAL LOW (ref 12.0–15.0)
Immature Granulocytes: 1 %
Lymphocytes Relative: 12 %
Lymphs Abs: 1.1 10*3/uL (ref 0.7–4.0)
MCH: 29.3 pg (ref 26.0–34.0)
MCHC: 28.1 g/dL — ABNORMAL LOW (ref 30.0–36.0)
MCV: 104.1 fL — ABNORMAL HIGH (ref 80.0–100.0)
Monocytes Absolute: 0.7 10*3/uL (ref 0.1–1.0)
Monocytes Relative: 8 %
Neutro Abs: 7 10*3/uL (ref 1.7–7.7)
Neutrophils Relative %: 77 %
Platelets: 148 10*3/uL — ABNORMAL LOW (ref 150–400)
RBC: 3.38 MIL/uL — ABNORMAL LOW (ref 3.87–5.11)
RDW: 12.7 % (ref 11.5–15.5)
WBC: 9.1 10*3/uL (ref 4.0–10.5)
nRBC: 0 % (ref 0.0–0.2)

## 2018-05-02 LAB — GLUCOSE, CAPILLARY
Glucose-Capillary: 153 mg/dL — ABNORMAL HIGH (ref 70–99)
Glucose-Capillary: 162 mg/dL — ABNORMAL HIGH (ref 70–99)
Glucose-Capillary: 163 mg/dL — ABNORMAL HIGH (ref 70–99)
Glucose-Capillary: 256 mg/dL — ABNORMAL HIGH (ref 70–99)

## 2018-05-02 LAB — BASIC METABOLIC PANEL
Anion gap: 5 (ref 5–15)
BUN: 34 mg/dL — ABNORMAL HIGH (ref 6–20)
CO2: 25 mmol/L (ref 22–32)
Calcium: 8.1 mg/dL — ABNORMAL LOW (ref 8.9–10.3)
Chloride: 109 mmol/L (ref 98–111)
Creatinine, Ser: 3.22 mg/dL — ABNORMAL HIGH (ref 0.44–1.00)
GFR calc Af Amer: 19 mL/min — ABNORMAL LOW (ref >60)
GFR calc non Af Amer: 16 mL/min — ABNORMAL LOW (ref >60)
Glucose, Bld: 163 mg/dL — ABNORMAL HIGH (ref 70–99)
Potassium: 5.3 mmol/L — ABNORMAL HIGH (ref 3.5–5.1)
Sodium: 139 mmol/L (ref 135–145)

## 2018-05-02 LAB — UREA NITROGEN, URINE: Urea Nitrogen, Ur: 257 mg/dL

## 2018-05-02 LAB — POTASSIUM: Potassium: 6.2 mmol/L — ABNORMAL HIGH (ref 3.5–5.1)

## 2018-05-02 MED ORDER — FLUTICASONE PROPIONATE 50 MCG/ACT NA SUSP
1.0000 | Freq: Every day | NASAL | Status: DC
  Administered 2018-05-02 – 2018-05-18 (×8): 1 via NASAL
  Filled 2018-05-02 (×2): qty 16

## 2018-05-02 NOTE — Progress Notes (Signed)
Pt unable to lay flat. Pt's head was raised to over 30 degrees with pillows on MRI table as high as you can be raised for ones head to clear the bore. Pt unable to lay flat more than about 20 secs before having to sit up immediately for unable to breath. Pt stated there was no way she could do this exam. Pt wants to speak to her physician about inabilty to preform test and discuss other possible options.

## 2018-05-02 NOTE — Care Management Note (Signed)
Case Management Note  Patient Details  Name: Melody Gray MRN: [id number] Date of Birth: [birth year]  Subjective/Objective: Hyperkalemia. Hx: OSA. From home. Active w/[employer]-home 02,trach collar. CM referral for asst w/humidifier for home 02-TC [employer] spoke to  Helen-will need home dme order for humidifier for home 02 indicating the need-MD notified. Will fax order to [place] w/s office-[phone].                   Action/Plan:d/c plan home   Expected Discharge Date:  (unknown)               Expected Discharge Plan:  Home/Self Care  In-House Referral:     Discharge planning Services  CM Consult  Post Acute Care Choice:  Durable Medical Equipment([employer]-home 02,trach collar) Choice offered to:     DME Arranged:    DME Agency:     HH Arranged:    HH Agency:     Status of Service:  In process, will continue to follow  If discussed at Long Length of Stay Meetings, dates discussed:    Additional Comments:  Dessa Phi, RN 05/02/2018, 12:33 PM

## 2018-05-02 NOTE — Progress Notes (Signed)
SLP Cancellation Note  Patient Details Name: Melody Gray MRN: [id number] DOB: 1970/12/04   Cancelled treatment:       Reason Eval/Treat Not Completed: SLP screened, no needs identified, will sign off. Pts swallow function and use of PMSV are stable. No SLP interventions needed in this case. Pt complains of a cold though and would like this addressed.    Autry Droege, Katherene Ponto 05/02/2018, 12:14 PM

## 2018-05-02 NOTE — Progress Notes (Signed)
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  Sarcoidosis diagnosed 2001-O2 dependent 10/2009-pulmonary arterial hypertension OHS requiring trach 07/2013 Right heart cath 08/11/2013 showing elevated CVP  Admit for cough increased dyspnea anorexia X 2 weeks on 12/31 Found to have new onset AKI creatinine 3.2 up from 2.7 potassium 6.9-Rx in the emergency room IV Lasix IV fluids insulin dextrose  A & Plan AKI superimposed on CKD 3-4-3-passing reasonable amounts of urine-Fena calculated shows intrinsic renal disease--will eventually need--further investigation reveals worked up 01/2018 PCP office ultrasound ordered showing kidney mass-had not received MRI that was ordered to help characterize the same so we will order without contrast at this time  Hyperkalemia-improved holding Kayexalate at this time  Pulmonary hypertension continue Coreg 6.25 twice daily amlodipine 10 daily and will need outpatient therapy and treatment  Cirrhosis?  2/2 sarcoid-needs weight loss and monitoring BMI is 41 which is likely the cause of her fatty liver and cirrhosis  Insulin-dependent DM last A1c 9.512/2019-CBGs 153-106 continue Lantus 20 units and sliding scale coverage-holding glipizide 0.5 but resume Victoza  Chronic respiratory failure requiring trach-follows as an outpatient will need trach care   Hashim Eichhorst, MD  [facility] Direct contact: [phone] --Via amion app OR  --[email or web address]; password [email or web address]  7PM-7AM contact night coverage as above 05/02/2018, 11:44 AM  LOS: 1 day   Consultants:  Pulmonary  Procedures:  No  Antimicrobials:  no  Interval history/Subjective:  Sleepy this morning does not use oxygen continuously and found to be in with sats in the 70s however now is awake alert coherent  Objective:  Vitals:  Vitals:   05/02/18 0735  05/02/18 1118  BP:    Pulse:    Resp:    Temp:    SpO2: 90% 91%    Exam:  Awake sitting bedside Chest is clear no added sound Abdomen is soft nontender no rebound no guarding Neurologically intact cranial nerves II through XII grossly intact power is 5/5 sensory is intact [name] is in place she phonates well I did not examine the stoma Extraocular movements are intact Sensory is grossly intact   I have personally reviewed the following:  DATA   Labs:  BUN/creatinine stable in the 34/3.2 range  Potassium 6.6 down to 5.3  Hemoglobin 10.3 MCV 103 platelet 136  Scheduled Meds: . amLODipine  10 mg Oral Daily  . carvedilol  6.25 mg Oral BID  . chlorhexidine  15 mL Mouth Rinse BID  . famotidine  20 mg Oral Daily  . heparin  5,000 Units Subcutaneous Q8H  . insulin aspart  0-5 Units Subcutaneous QHS  . insulin aspart  0-9 Units Subcutaneous TID WC  . insulin detemir  20 Units Subcutaneous QHS  . mouth rinse  15 mL Mouth Rinse q12n4p  . sodium chloride flush  3 mL Intravenous Q12H  . sodium chloride flush  3 mL Intravenous Q12H   Continuous Infusions: . sodium chloride      Principal Problem:   Hyperkalemia Active Problems:   Lesion of liver and cirrhosis on imaging   Insulin-requiring or dependent type II diabetes mellitus ([place])   Hypertension complicating diabetes ([place])   Acute renal failure superimposed on stage 4 chronic kidney disease (HCC)   Chronic respiratory failure with hypoxia (HCC)/ trach dep   Chronic pulmonary hypertension ([place])   LOS: 1 day

## 2018-05-03 ENCOUNTER — Inpatient Hospital Stay (HOSPITAL_COMMUNITY): Payer: Medicare Other

## 2018-05-03 ENCOUNTER — Inpatient Hospital Stay (HOSPITAL_COMMUNITY): Payer: Medicare Other | Admitting: Anesthesiology

## 2018-05-03 ENCOUNTER — Encounter (HOSPITAL_COMMUNITY)
Admission: EM | Disposition: A | Discharge status: Home Health | Payer: Self-pay | Source: Home / Self Care | Attending: Internal Medicine

## 2018-05-03 ENCOUNTER — Encounter (HOSPITAL_COMMUNITY): Payer: Self-pay | Admitting: Anesthesiology

## 2018-05-03 DIAGNOSIS — I272 Pulmonary hypertension, unspecified: Secondary | ICD-10-CM

## 2018-05-03 DIAGNOSIS — J9611 Chronic respiratory failure with hypoxia: Secondary | ICD-10-CM

## 2018-05-03 DIAGNOSIS — I469 Cardiac arrest, cause unspecified: Secondary | ICD-10-CM

## 2018-05-03 DIAGNOSIS — E875 Hyperkalemia: Secondary | ICD-10-CM

## 2018-05-03 DIAGNOSIS — N179 Acute kidney failure, unspecified: Secondary | ICD-10-CM

## 2018-05-03 DIAGNOSIS — R579 Shock, unspecified: Secondary | ICD-10-CM

## 2018-05-03 DIAGNOSIS — M79A22 Nontraumatic compartment syndrome of left lower extremity: Secondary | ICD-10-CM

## 2018-05-03 DIAGNOSIS — I998 Other disorder of circulatory system: Secondary | ICD-10-CM

## 2018-05-03 DIAGNOSIS — M7981 Nontraumatic hematoma of soft tissue: Secondary | ICD-10-CM

## 2018-05-03 DIAGNOSIS — Z9911 Dependence on respirator [ventilator] status: Secondary | ICD-10-CM

## 2018-05-03 DIAGNOSIS — T79A22A Traumatic compartment syndrome of left lower extremity, initial encounter: Secondary | ICD-10-CM | POA: Diagnosis not present

## 2018-05-03 DIAGNOSIS — N184 Chronic kidney disease, stage 4 (severe): Secondary | ICD-10-CM

## 2018-05-03 DIAGNOSIS — J9602 Acute respiratory failure with hypercapnia: Secondary | ICD-10-CM

## 2018-05-03 DIAGNOSIS — N183 Chronic kidney disease, stage 3 (moderate): Secondary | ICD-10-CM

## 2018-05-03 DIAGNOSIS — E1159 Type 2 diabetes mellitus with other circulatory complications: Secondary | ICD-10-CM

## 2018-05-03 DIAGNOSIS — J9601 Acute respiratory failure with hypoxia: Secondary | ICD-10-CM

## 2018-05-03 DIAGNOSIS — I10 Essential (primary) hypertension: Secondary | ICD-10-CM

## 2018-05-03 HISTORY — PX: FASCIOTOMY: SHX132

## 2018-05-03 HISTORY — DX: Cardiac arrest, cause unspecified: I46.9

## 2018-05-03 LAB — CG4 I-STAT (LACTIC ACID): Lactic Acid, Venous: 1.72 mmol/L (ref 0.5–1.9)

## 2018-05-03 LAB — PROCALCITONIN: Procalcitonin: 0.42 ng/mL

## 2018-05-03 LAB — COMPREHENSIVE METABOLIC PANEL
ALT: 82 U/L — ABNORMAL HIGH (ref 0–44)
AST: 110 U/L — ABNORMAL HIGH (ref 15–41)
Albumin: 2.6 g/dL — ABNORMAL LOW (ref 3.5–5.0)
Alkaline Phosphatase: 224 U/L — ABNORMAL HIGH (ref 38–126)
Anion gap: 6 (ref 5–15)
BUN: 42 mg/dL — ABNORMAL HIGH (ref 6–20)
CO2: 26 mmol/L (ref 22–32)
Calcium: 8.5 mg/dL — ABNORMAL LOW (ref 8.9–10.3)
Chloride: 108 mmol/L (ref 98–111)
Creatinine, Ser: 4.67 mg/dL — ABNORMAL HIGH (ref 0.44–1.00)
GFR calc Af Amer: 12 mL/min — ABNORMAL LOW (ref >60)
GFR calc non Af Amer: 10 mL/min — ABNORMAL LOW (ref >60)
Glucose, Bld: 228 mg/dL — ABNORMAL HIGH (ref 70–99)
Potassium: 5.5 mmol/L — ABNORMAL HIGH (ref 3.5–5.1)
Sodium: 140 mmol/L (ref 135–145)
Total Bilirubin: 0.6 mg/dL (ref 0.3–1.2)
Total Protein: 6.5 g/dL (ref 6.5–8.1)

## 2018-05-03 LAB — LACTIC ACID, PLASMA: Lactic Acid, Venous: 1.2 mmol/L (ref 0.5–1.9)

## 2018-05-03 LAB — BLOOD GAS, ARTERIAL
Acid-base deficit: 5.9 mmol/L — ABNORMAL HIGH (ref 0.0–2.0)
Bicarbonate: 22.3 mmol/L (ref 20.0–28.0)
Drawn by: 270211
FIO2: 1
MECHVT: 360 mL
O2 Saturation: 85.5 %
PEEP: 5 cmH2O
Patient temperature: 96
RATE: 25 resp/min
pCO2 arterial: 58.4 mmHg — ABNORMAL HIGH (ref 32.0–48.0)
pH, Arterial: 7.196 — CL (ref 7.350–7.450)
pO2, Arterial: 48 mmHg — ABNORMAL LOW (ref 83.0–108.0)

## 2018-05-03 LAB — RENAL FUNCTION PANEL
Albumin: 3 g/dL — ABNORMAL LOW (ref 3.5–5.0)
Anion gap: 9 (ref 5–15)
BUN: 43 mg/dL — ABNORMAL HIGH (ref 6–20)
CO2: 23 mmol/L (ref 22–32)
Calcium: 8.2 mg/dL — ABNORMAL LOW (ref 8.9–10.3)
Chloride: 103 mmol/L (ref 98–111)
Creatinine, Ser: 4.52 mg/dL — ABNORMAL HIGH (ref 0.44–1.00)
GFR calc Af Amer: 12 mL/min — ABNORMAL LOW (ref >60)
GFR calc non Af Amer: 11 mL/min — ABNORMAL LOW (ref >60)
Glucose, Bld: 152 mg/dL — ABNORMAL HIGH (ref 70–99)
Phosphorus: 6.2 mg/dL — ABNORMAL HIGH (ref 2.5–4.6)
Potassium: 6.1 mmol/L — ABNORMAL HIGH (ref 3.5–5.1)
Sodium: 135 mmol/L (ref 135–145)

## 2018-05-03 LAB — GLUCOSE, CAPILLARY
Glucose-Capillary: 145 mg/dL — ABNORMAL HIGH (ref 70–99)
Glucose-Capillary: 125 mg/dL — ABNORMAL HIGH (ref 70–99)
Glucose-Capillary: 230 mg/dL — ABNORMAL HIGH (ref 70–99)
Glucose-Capillary: 236 mg/dL — ABNORMAL HIGH (ref 70–99)

## 2018-05-03 LAB — POCT I-STAT, CHEM 8
BUN: 38 mg/dL — ABNORMAL HIGH (ref 6–20)
Calcium, Ion: 1.3 mmol/L (ref 1.15–1.40)
Chloride: 112 mmol/L — ABNORMAL HIGH (ref 98–111)
Creatinine, Ser: 3.6 mg/dL — ABNORMAL HIGH (ref 0.44–1.00)
Glucose, Bld: 262 mg/dL — ABNORMAL HIGH (ref 70–99)
HCT: 27 % — ABNORMAL LOW (ref 36.0–46.0)
Hemoglobin: 9.2 g/dL — ABNORMAL LOW (ref 12.0–15.0)
Potassium: 4.4 mmol/L (ref 3.5–5.1)
Sodium: 141 mmol/L (ref 135–145)
TCO2: 23 mmol/L (ref 22–32)

## 2018-05-03 LAB — POCT I-STAT TROPONIN I: Troponin i, poc: 0.02 ng/mL (ref 0.00–0.08)

## 2018-05-03 LAB — CBC
HCT: 32.5 % — ABNORMAL LOW (ref 36.0–46.0)
Hemoglobin: 9.1 g/dL — ABNORMAL LOW (ref 12.0–15.0)
MCH: 29.4 pg (ref 26.0–34.0)
MCHC: 28 g/dL — ABNORMAL LOW (ref 30.0–36.0)
MCV: 104.8 fL — ABNORMAL HIGH (ref 80.0–100.0)
Platelets: 115 10*3/uL — ABNORMAL LOW (ref 150–400)
RBC: 3.1 MIL/uL — ABNORMAL LOW (ref 3.87–5.11)
RDW: 12.5 % (ref 11.5–15.5)
WBC: 10.1 10*3/uL (ref 4.0–10.5)
nRBC: 0 % (ref 0.0–0.2)

## 2018-05-03 SURGERY — FASCIOTOMY, UPPER EXTREMITY
Anesthesia: General | Laterality: Left

## 2018-05-03 MED ORDER — SODIUM BICARBONATE 8.4 % IV SOLN
INTRAVENOUS | Status: AC
  Administered 2018-05-03: 18:00:00
  Filled 2018-05-03: qty 100

## 2018-05-03 MED ORDER — FENTANYL BOLUS VIA INFUSION
50.0000 ug | INTRAVENOUS | Status: DC | PRN
  Filled 2018-05-03: qty 50

## 2018-05-03 MED ORDER — MIDAZOLAM HCL 2 MG/2ML IJ SOLN
INTRAMUSCULAR | Status: AC
  Administered 2018-05-03: 2 mg
  Filled 2018-05-03: qty 4

## 2018-05-03 MED ORDER — ALBUTEROL SULFATE (2.5 MG/3ML) 0.083% IN NEBU
2.5000 mg | INHALATION_SOLUTION | RESPIRATORY_TRACT | Status: DC | PRN
  Administered 2018-05-05: 2.5 mg via RESPIRATORY_TRACT
  Filled 2018-05-03: qty 3

## 2018-05-03 MED ORDER — DOCUSATE SODIUM 50 MG/5ML PO LIQD
100.0000 mg | Freq: Two times a day (BID) | ORAL | Status: DC | PRN
  Administered 2018-05-09: 100 mg
  Filled 2018-05-03: qty 10

## 2018-05-03 MED ORDER — HEPARIN (PORCINE) 25000 UT/250ML-% IV SOLN
1350.0000 [IU]/h | INTRAVENOUS | Status: DC
  Administered 2018-05-03: 1350 [IU]/h via INTRAVENOUS
  Filled 2018-05-03 (×2): qty 250

## 2018-05-03 MED ORDER — SODIUM CHLORIDE 0.9 % IV SOLN: 250.0000 mL | INTRAVENOUS | Status: DC

## 2018-05-03 MED ORDER — PROPOFOL 1000 MG/100ML IV EMUL
5.0000 ug/kg/min | INTRAVENOUS | Status: DC
  Administered 2018-05-03 – 2018-05-04 (×2): 15 ug/kg/min via INTRAVENOUS
  Administered 2018-05-04: 20.007 ug/kg/min via INTRAVENOUS
  Administered 2018-05-05: 30 ug/kg/min via INTRAVENOUS
  Administered 2018-05-05: 20 ug/kg/min via INTRAVENOUS
  Filled 2018-05-03 (×4): qty 100

## 2018-05-03 MED ORDER — CHLORHEXIDINE GLUCONATE 0.12% ORAL RINSE (MEDLINE KIT)
15.0000 mL | Freq: Two times a day (BID) | OROMUCOSAL | Status: DC
  Administered 2018-05-03 – 2018-05-10 (×13): 15 mL via OROMUCOSAL

## 2018-05-03 MED ORDER — FENTANYL 2500MCG IN NS 250ML (10MCG/ML) PREMIX INFUSION
25.0000 ug/h | INTRAVENOUS | Status: DC
  Administered 2018-05-03 – 2018-05-04 (×2): 100 ug/h via INTRAVENOUS
  Administered 2018-05-05: 150 ug/h via INTRAVENOUS
  Filled 2018-05-03 (×3): qty 250

## 2018-05-03 MED ORDER — ROCURONIUM BROMIDE 100 MG/10ML IV SOLN
INTRAVENOUS | Status: DC | PRN
  Administered 2018-05-03: 50 mg via INTRAVENOUS

## 2018-05-03 MED ORDER — SODIUM ZIRCONIUM CYCLOSILICATE 10 G PO PACK
10.0000 g | PACK | Freq: Once | ORAL | Status: DC
  Filled 2018-05-03: qty 1

## 2018-05-03 MED ORDER — ORAL CARE MOUTH RINSE
15.0000 mL | OROMUCOSAL | Status: DC
  Administered 2018-05-04 – 2018-05-10 (×59): 15 mL via OROMUCOSAL

## 2018-05-03 MED ORDER — 0.9 % SODIUM CHLORIDE (POUR BTL) OPTIME
TOPICAL | Status: DC | PRN
  Administered 2018-05-03: 1000 mL

## 2018-05-03 MED ORDER — FENTANYL CITRATE (PF) 100 MCG/2ML IJ SOLN: 100.0000 ug | INTRAMUSCULAR | Status: DC | PRN

## 2018-05-03 MED ORDER — NOREPINEPHRINE 4 MG/250ML-% IV SOLN
0.0000 ug/min | INTRAVENOUS | Status: DC
  Administered 2018-05-03: 4 ug/min via INTRAVENOUS
  Administered 2018-05-03: 2 ug/min via INTRAVENOUS
  Filled 2018-05-03 (×2): qty 250

## 2018-05-03 MED ORDER — LEVOFLOXACIN 500 MG PO TABS: 500.0000 mg | ORAL_TABLET | ORAL | Status: DC

## 2018-05-03 MED ORDER — PROPOFOL 1000 MG/100ML IV EMUL
INTRAVENOUS | Status: AC
  Filled 2018-05-03: qty 100

## 2018-05-03 MED ORDER — ALBUTEROL SULFATE (2.5 MG/3ML) 0.083% IN NEBU
2.5000 mg | INHALATION_SOLUTION | Freq: Once | RESPIRATORY_TRACT | Status: AC
  Administered 2018-05-03: 2.5 mg via RESPIRATORY_TRACT

## 2018-05-03 MED ORDER — FENTANYL CITRATE (PF) 100 MCG/2ML IJ SOLN
50.0000 ug | Freq: Once | INTRAMUSCULAR | Status: AC
  Administered 2018-05-03: 50 ug via INTRAVENOUS

## 2018-05-03 MED ORDER — SODIUM CHLORIDE 0.9 % IV SOLN
INTRAVENOUS | Status: DC | PRN
  Administered 2018-05-03: 21:00:00 via INTRAVENOUS

## 2018-05-03 MED ORDER — SODIUM CHLORIDE 0.9 % IV SOLN
1.0000 g | Freq: Two times a day (BID) | INTRAVENOUS | Status: DC
  Administered 2018-05-03 – 2018-05-06 (×7): 1 g via INTRAVENOUS
  Filled 2018-05-03 (×10): qty 1

## 2018-05-03 MED ORDER — SODIUM CHLORIDE 0.9 % IV SOLN
INTRAVENOUS | Status: DC
  Administered 2018-05-03 – 2018-05-05 (×4): via INTRAVENOUS

## 2018-05-03 MED ORDER — HEPARIN BOLUS VIA INFUSION
2200.0000 [IU] | INTRAVENOUS | Status: AC
  Administered 2018-05-03: 2200 [IU] via INTRAVENOUS
  Filled 2018-05-03: qty 2200

## 2018-05-03 MED FILL — Medication: Qty: 2 | Status: AC

## 2018-05-03 SURGICAL SUPPLY — 34 items
BANDAGE ACE 4X5 VEL STRL LF (GAUZE/BANDAGES/DRESSINGS) IMPLANT
BANDAGE ACE 6X5 VEL STRL LF (GAUZE/BANDAGES/DRESSINGS) IMPLANT
BANDAGE ELASTIC 6 VELCRO ST LF (GAUZE/BANDAGES/DRESSINGS) ×1 IMPLANT
BNDG GAUZE ELAST 4 BULKY (GAUZE/BANDAGES/DRESSINGS) ×2 IMPLANT
CANISTER SUCT 3000ML PPV (MISCELLANEOUS) ×2 IMPLANT
COVER WAND RF STERILE (DRAPES) ×2 IMPLANT
DRAPE ORTHO SPLIT 77X108 STRL (DRAPES) ×4
DRAPE SURG ORHT 6 SPLT 77X108 (DRAPES) IMPLANT
ELECT REM PT RETURN 9FT ADLT (ELECTROSURGICAL) ×2
ELECTRODE REM PT RTRN 9FT ADLT (ELECTROSURGICAL) ×1 IMPLANT
GAUZE SPONGE 4X4 12PLY STRL (GAUZE/BANDAGES/DRESSINGS) ×1 IMPLANT
GAUZE SPONGE 4X4 12PLY STRL LF (GAUZE/BANDAGES/DRESSINGS) ×2 IMPLANT
GLOVE BIO SURGEON STRL SZ7.5 (GLOVE) ×3 IMPLANT
GLOVE BIOGEL PI IND STRL 7.0 (GLOVE) IMPLANT
GLOVE BIOGEL PI IND STRL 7.5 (GLOVE) IMPLANT
GLOVE BIOGEL PI INDICATOR 7.0 (GLOVE) ×1
GLOVE BIOGEL PI INDICATOR 7.5 (GLOVE) ×2
GOWN STRL REUS W/ TWL LRG LVL3 (GOWN DISPOSABLE) ×3 IMPLANT
GOWN STRL REUS W/TWL LRG LVL3 (GOWN DISPOSABLE) ×4
KIT BASIN OR (CUSTOM PROCEDURE TRAY) ×2 IMPLANT
KIT TURNOVER KIT B (KITS) ×2 IMPLANT
NS IRRIG 1000ML POUR BTL (IV SOLUTION) ×2 IMPLANT
PACK GENERAL/GYN (CUSTOM PROCEDURE TRAY) ×1 IMPLANT
PACK UNIVERSAL I (CUSTOM PROCEDURE TRAY) IMPLANT
PAD ABD 8X10 STRL (GAUZE/BANDAGES/DRESSINGS) ×2 IMPLANT
PAD ARMBOARD 7.5X6 YLW CONV (MISCELLANEOUS) ×4 IMPLANT
STAPLER VISISTAT 35W (STAPLE) IMPLANT
SUT ETHILON 3 0 PS 1 (SUTURE) IMPLANT
SUT VIC AB 2-0 CTX 36 (SUTURE) IMPLANT
SUT VIC AB 3-0 SH 27 (SUTURE)
SUT VIC AB 3-0 SH 27X BRD (SUTURE) IMPLANT
SUT VICRYL 4-0 PS2 18IN ABS (SUTURE) IMPLANT
TOWEL GREEN STERILE (TOWEL DISPOSABLE) ×2 IMPLANT
WATER STERILE IRR 1000ML POUR (IV SOLUTION) ×2 IMPLANT

## 2018-05-03 NOTE — Progress Notes (Signed)
addedn  Called by RN to bedside-patient coding-- Dr. Eulis Foster of the ED running code at time of my arrival CPR in process for ~ 30 min and then ROSC after multiple interventions  ICU attending aware and Dr. Joelyn Oms aware of nephrology will also be seeing patient  Appreciate CCM and ED Nephrology input into care  Verneita Griffes, MD [facility] 2:58 PM

## 2018-05-03 NOTE — H&P (Signed)
PULMONARY / CRITICAL CARE MEDICINE   NAME:  Melody Gray, MRN:  [id number], DOB:  [birth year], LOS: 2 ADMISSION DATE:  04/30/2018, CONSULTATION DATE: 05/03/2018 REFERRING MD: Valeta Harms, CHIEF COMPLAINT: Cardiac arrest BRIEF HISTORY:    48 year old lady with a history of chronic tracheostomy and sarcoidosis post cardiac arrest now with left leg compartment syndrome secondary to enterolysis access post cardiac arrest.  HISTORY OF PRESENT ILLNESS   This is a 48 y.o., female admitted on 04/30/2018 for past medical history of sarcoidosis oxygen dependent chronic trach with obesity hypoventilation syndrome followed in the trach clinic by [name].  Patient was admitted to the hospital with new onset renal failure was found to be hyperkalemic was receiving treatments for this on the floor and developed a PEA arrest on the floor.  She was coded for approximately 40 minutes with spontaneous return of circulation.  During this time she had a intraosseous line placed to the left tibia and she was unable to be oxygenated through a cuffless trach.  Upon transfer to the ICU her initial sats were in the 63s.  An attempt of urgent trach exchange which was unsuccessful.    Also an attempt of  endotracheal intubation which was unsuccessful.  ENT was able to cannulate the trachea with a endotracheal tube through her ostomy.  Ultimately she was found to have a pulseless left extremity and is being anticoagulated.  Patient was transferred to [facility] for further management and to be evaluated by vascular surgery who are at bedside at the time of me evaluating her.  Patient does have palpable pulses on both feet but weaker on the left and right and very tense calf.  Patient is now moving much tidal volume and she is intubated through her stoma with a 6.5 ET tube.  Patient does wake up and respond to verbal stimuli.  SIGNIFICANT PAST MEDICAL HISTORY    . Abnormal LFTs (liver function tests)    Liver U/S and  exam c/w HSM. Hep B serology neg. but Hep C ab +, HIV neg. 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 Acute bronchitis 07/29/2014  . Anemia   . Cardiomyopathy, nonischemic ([place])    a. Varying EF over the years - initially 35% in 09/2010. Normal cors 12/2010. b. Echo 07/2014: EF 55-60%, no RWMA, + diastolic flattening and systolic flattening c/w RV volume and pressure overload, mild LAE/RAE, mod dilated RV, mod TR, PASP severely increased at 42mmHg. c. RHC 07/2014: moderate pulmonary HTN likely WHO group 3 with markedly elevated CVP and relatively normal left sided pressures.  . Chronic diastolic CHF (congestive heart failure) ([place])   . Chronic respiratory failure ([place])    a. became O2 dependent in July 2011. b. She required trache placement in 07/2013 and has been followed by pulmonology as well.  . CKD (chronic kidney disease), stage II   . Complication of anesthesia    " difficult waking "  . Diabetes mellitus    insulin dependent  . Diabetic retinopathy    Right eye 2/11  . Essential hypertension   . Health maintenance examination    Mammogram 05/2010 Negative; Last Pap smear 03/2008; Last DM eye exam 2/11> mild non-proliferative diabetic retinopathy. OD  . Helicobacter pylori ab+ 05/2011   Pt was symptomatic and treatment planned for 05/2011  . Hx of cardiac cath 2/08   No CAD, no RAS,  normal EF  . Hypokalemia   .  Hypomagnesemia   . Hypoxemia    CT angiogram 9/11>> No PE; PFTs 10/11- FEV1 1.20 (49%) with 16% better p B2, DLCO 33%> corrects to 84; O2 sats ok on 4 lpm X rapid walk X 3 laps 05/2010  . Long term current use of systemic steroids   . Morbid obesity ([place])    Target wt= 153 for BMI <30  . Obesity hypoventilation syndrome ([place])   . Pulmonary hypertension ([place])   . QT prolongation   . Sarcoidosis    Followed by Dr. Melvyn Novas; w/ liver involvement per biopsy 12/09, Reversible airway component so started on  [facility] 01/2010; HFA 75% p coaching 05/2010  . Seborrheic dermatitis of scalp   . Sleep apnea   . Tobacco abuse      SIGNIFICANT EVENTS:  Cardiac arrest 05/03/2018  STUDIES:    CULTURES:    ANTIBIOTICS:  Levaquin po 05/03/2018>> 05/03/2018 Cefepime 05/03/2018 >>   LINES/TUBES:  L Intr Os 05/03/2018 A-line 1/3>> TLC 1/3>> ET tube through stoma 05/03/2018 CONSULTANTS:   Vascular surgery SUBJECTIVE:    CONSTITUTIONAL: BP 96/60   Pulse 79   Temp (!) 97.5 F (36.4 C)   Resp (!) 28   Ht 5\' 1"  (1.549 m)   Wt 100.8 kg   SpO2 (!) 88%   BMI 41.98 kg/m   I/O last 3 completed shifts: In: -  Out: 500 [Urine:500]     Vent Mode: PRVC FiO2 (%):  [60 %-100 %] 100 % Set Rate:  [25 bmp-28 bmp] 28 bmp Vt Set:  [380 mL-420 mL] 420 mL PEEP:  [5 cmH20-10 cmH20] 10 cmH20 Plateau Pressure:  [33 cmH20] 33 cmH20  PHYSICAL EXAM: General: Acutely ill obese on mechanical ventilation through ET tube through her stoma  neuro: Wakes up nods her head HEENT: Stoma midline in her neck with 6.5 ET tube Cardiovascular: Normal heart sound no sounds or murmurs Lungs: Expiratory wheezes bilaterally with bilateral crackles Abdomen: Soft no tenderness no organomegaly Musculoskeletal: Patient has very tense left calf with weaker pulses on the dorsalis pedis of the left foot compared to the right foot Skin: No rash  RESOLVED PROBLEM LIST   ASSESSMENT AND PLAN   Assessment: -Post cardiac arrest/PEA -Left leg compartment syndrome -Acute on chronic hypoxemic respiratory failure require mechanical ventilation -Difficult intubation status post intubation through stoma -Sarcoidosis -Cardiogenic shock post cardiac arrest -Pulmonary hypertension -Left lower lobe pneumonia -Acute on chronic renal failure -Liver cirrhosis  Plan: -Admit to the intensive care unit at [facility] with propofol and fentanyl drips -Titrate Levophed to keep map above 65 -Patient had multiple attempts of central  line insertion at outside hospital with no success we will hold off on inserting a central line for now unless Levophed drip needed to be increased. -Discussed with vascular surgery at bedside patient will be taken for fasciotomy.  Heparin drip will be discontinued -Frequent Doppler lower extremities -Cefepime for pneumonia -Follow urine output and renal function -Stat chest x-ray patient not moving much tidal volume will need to assess for the site of the ET tube  I have spent 45 minutes of critical care time this time was spent bedside or in the unit this time was exclusive of any billable procedures patient is needing intensive care due to acute respiratory failure require mechanical ventilation SUMMARY OF TODAY'S PLAN:    Best Practice / Goals of Care / Disposition.   DVT PROPHYLAXIS: As per surgery SUP: PPI NUTRITION: N.p.o. MOBILITY: Bedrest GOALS OF CARE: Full code FAMILY  DISCUSSIONS: No family bedside DISPOSITION ICU admission  LABS  Glucose Recent Labs  Lab 05/02/18 0811 05/02/18 1232 05/02/18 1709 05/02/18 2236 05/03/18 0747 05/03/18 1142  GLUCAP 153* 162* 163* 256* 145* 125*    BMET Recent Labs  Lab 05/02/18 0815  05/03/18 0814 05/03/18 1456 05/03/18 1608  NA 139  --  135 141 140  K 5.3*   < > 6.1* 4.4 5.5*  CL 109  --  103 112* 108  CO2 25  --  23  --  26  BUN 34*  --  43* 38* 42*  CREATININE 3.22*  --  4.52* 3.60* 4.67*  GLUCOSE 163*  --  152* 262* 228*   < > = values in this interval not displayed.    Liver Enzymes Recent Labs  Lab 04/30/18 1902 05/03/18 0814 05/03/18 1608  AST 28  --  110*  ALT 24  --  82*  ALKPHOS 209*  --  224*  BILITOT 0.3  --  0.6  ALBUMIN 3.5 3.0* 2.6*    Electrolytes Recent Labs  Lab 04/30/18 2035  05/02/18 0815 05/03/18 0814 05/03/18 1608  CALCIUM  --    < > 8.1* 8.2* 8.5*  MG 2.4  --   --   --   --   PHOS  --   --   --  6.2*  --    < > = values in this interval not displayed.    CBC Recent Labs  Lab  05/01/18 0538 05/02/18 0815 05/03/18 1456 05/03/18 1608  WBC 4.7 9.1  --  10.1  HGB 10.3* 9.9* 9.2* 9.1*  HCT 36.2 35.2* 27.0* 32.5*  PLT 136* 148*  --  115*    ABG Recent Labs  Lab 05/03/18 1733  PHART 7.196*  PCO2ART 58.4*  PO2ART 48.0*    Coag's No results for input(s): APTT, INR in the last 168 hours.  Sepsis Markers Recent Labs  Lab 05/03/18 1452 05/03/18 1453 05/03/18 1608  LATICACIDVEN 1.72 1.75 1.2  PROCALCITON  --   --  0.42    Cardiac Enzymes No results for input(s): TROPONINI, PROBNP in the last 168 hours.  PAST MEDICAL HISTORY :   She  has a past medical history of Abnormal LFTs (liver function tests), Acute bronchitis (07/29/2014), Anemia, Cardiomyopathy, nonischemic ([facility]), Chronic diastolic CHF (congestive heart failure) ([place]), Chronic respiratory failure ([place]), CKD (chronic kidney disease), stage II, Complication of anesthesia, Diabetes mellitus, Diabetic retinopathy, Essential hypertension, Health maintenance examination, Helicobacter pylori ab+ (05/2011), cardiac cath (2/08), Hypokalemia, Hypomagnesemia, Hypoxemia, Long term current use of systemic steroids, Morbid obesity ([place]), Obesity hypoventilation syndrome ([place]), Pulmonary hypertension ([place]), QT prolongation, Sarcoidosis, Seborrheic dermatitis of scalp, Sleep apnea, and Tobacco abuse.  PAST SURGICAL HISTORY:  She  has a past surgical history that includes Tubal ligation; Breast surgery; Cesarean section; Tracheostomy tube placement (N/A, 08/10/2013); and right heart catheterization (N/A, 08/12/2014).  Allergies  Allergen Reactions  . Vicodin [Hydrocodone-Acetaminophen] Itching    No current facility-administered medications on file prior to encounter.    Current Outpatient Medications on File Prior to Encounter  Medication Sig  . amLODipine (NORVASC) 10 MG tablet Take 1 tablet (10 mg total) by mouth daily.  . carvedilol (COREG) 6.25 MG tablet Take 1 tablet (6.25 mg total) by mouth 2 (two) times  daily.  Marland Kitchen glipiZIDE (GLUCOTROL) 5 MG tablet Take 0.5 tablets (2.5 mg total) by mouth daily before breakfast.  . insulin detemir (LEVEMIR) 100 UNIT/ML injection ADMINISTER 50 UNITS UNDER THE SKIN AT  BEDTIME  . liraglutide (VICTOZA) 18 MG/3ML SOPN Inject 0.3 mLs (1.8 mg total) into the skin daily.  . Pseudoephedrine-DM-GG (ROBITUSSIN COLD & COUGH PO) Take 15 mLs by mouth daily as needed (cough).  . ranitidine (ZANTAC) 150 MG tablet Take 1 tablet (150 mg total) by mouth 2 (two) times daily.  Marland Kitchen atorvastatin (LIPITOR) 40 MG tablet Take 1 tablet (40 mg total) by mouth daily. (Patient not taking: Reported on 04/30/2018)  . BD INSULIN SYRINGE U/F 30G X 1/2" 0.5 ML MISC USE AS DIRECTED  . glucose blood test strip Use to check blood sugar 3 times daily. Dx code E11.65. Insulin dependent  . Insulin Pen Needle (B-D UF III MINI PEN NEEDLES) 31G X 5 MM MISC USE TWICE A DAY    FAMILY HISTORY:   Her family history includes Asthma in her sister; Cancer in her mother; Diabetes in her father; Hypertension in an other family member; Multiple sclerosis in her father.  SOCIAL HISTORY:  She  reports that she has been smoking cigarettes. She started smoking about 3 years ago. She has a 6.60 pack-year smoking history. She has never used smokeless tobacco. She reports that she does not drink alcohol or use drugs.  REVIEW OF SYSTEMS:    Could not be obtained due to patient altered mental status

## 2018-05-03 NOTE — Procedures (Signed)
Intubation Procedure Note Melody Gray [id number] March 09, 1971  Procedure: Intubation Indications: need for positive pressure ventilation in setting of existing uncuffed trach  Procedure Details Consent: Unable to obtain consent because of emergent medical necessity.  Called to ICU STAT for airway emergency. Patient is previously trached due to history of sleep apnea and difficult airway. Had code blue earlier in the day and was now requiring positive pressure ventilation with a small, cuffless tracheostomy tube in place. Critical care MD had attempted to replace the tube with a cuffed tube and was unsuccessful. He also attempted to intubate orally and was unsuccessful, at which point anesthesia was called to assist. On arrival, the original uncuffed trach was back in place and sats were low 90s. Unable to quantitatively assess ventilation but did appear to be achieving reasonable ventilation, however required covering the mouth to prevent back flow of air. I agreed to attempt oral intubation while continuing ventilation through the uncuffed trach. The patient had already been given paralytic prior to my arrival.   Verified all equipment available including monitors, suction, ETT with stylet, Glidescope with size 4 blade. Attempted to intubate orally and was able to achieve a very poor grade 2 view with very abnormal appearing airway anatomy. Attempted to pass a 6.0 cuffed tube with stylet but had a difficult time passing the tube through the mouth which had a very small opening. Eventually could see the tube coming into view, but it appeared to be below the pharyngeal mucosa, indicating it was within a false passage in the posterior pharynx. At this point there was also some bleeding coming from the mouth. Decided to abort intubation attempt at this point and recommended continuing ventilation through the uncuffed trach and consulting ENT for more definitive management of the airway.  [name] 05/03/2018

## 2018-05-03 NOTE — Progress Notes (Signed)
Patient transported on ventilator to OR and hooked up in OR. Vitals remained stable.

## 2018-05-03 NOTE — H&P (Signed)
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 She had witnessed PEA arrest  05/03/2018 at approx 1416. She received ACLS  With multiple cycles of Epi . Additionally she received epi, calcium chloride, bicarb and Insulin, D50 through intra os which was placed during the code.Marland Kitchen ROSC was achieved at 14:52 just as the medical team were going to call the code. PCCM have been asked to assume care post arrest. 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 She had witnessed PEA arrest  05/03/2018 at approx 1416. She received ACLS  With multiple cycles of Epi . Additionally she received epi, calcium chloride, bicarb and Insulin, D50 through intra os which was placed during the code.Marland Kitchen ROSC was achieved at 14:52 just as the medical team were going to call the code. PCCM have been asked to assume care post arrest. SIGNIFICANT PAST MEDICAL HISTORY    Past Medical History:  Diagnosis Date  . Abnormal LFTs (liver function tests)    Liver U/S and exam c/w HSM. Hep B serology neg. but Hep C ab +, HIV neg. 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 Acute bronchitis 07/29/2014  . Anemia   . Cardiomyopathy, nonischemic ([place])    a. Varying EF over the years - initially 35% in 09/2010. Normal cors  12/2010. b. Echo 07/2014: EF 55-60%, no RWMA, + diastolic flattening and systolic flattening c/w RV volume and pressure overload, mild LAE/RAE, mod dilated RV, mod TR, PASP severely increased at 22mmHg. c. RHC 07/2014: moderate pulmonary HTN likely WHO group 3 with markedly elevated CVP and relatively normal left sided pressures.  . Chronic diastolic CHF (congestive heart failure) ([place])   . Chronic respiratory failure ([place])    a. became O2 dependent in July 2011. b. She required trache placement in 07/2013 and has been followed by pulmonology as well.  . CKD (chronic kidney disease), stage II   . Complication of anesthesia    " difficult waking "  . Diabetes mellitus    insulin dependent  . Diabetic retinopathy    Right eye 2/11  . Essential hypertension   . Health maintenance examination    Mammogram 05/2010 Negative; Last Pap smear 03/2008; Last DM eye exam 2/11> mild non-proliferative diabetic retinopathy. OD  . Helicobacter pylori ab+ 05/2011   Pt was symptomatic and treatment planned for 05/2011  . Hx of cardiac cath 2/08   No CAD, no RAS,  normal EF  . Hypokalemia   . Hypomagnesemia   . Hypoxemia    CT angiogram 9/11>> No PE; PFTs 10/11- FEV1 1.20 (49%) with 16% better p B2, DLCO 33%> corrects to 84; O2 sats ok on 4 lpm X rapid walk X 3 laps 05/2010  .  Long term current use of systemic steroids   . Morbid obesity ([place])    Target wt= 153 for BMI <30  . Obesity hypoventilation syndrome ([place])   . Pulmonary hypertension ([place])   . QT prolongation   . Sarcoidosis    Followed by Dr. Melvyn Novas; w/ liver involvement per biopsy 12/09, Reversible airway component so started on [facility] 01/2010; HFA 75% p coaching 05/2010  . Seborrheic dermatitis of scalp   . Sleep apnea   . Tobacco abuse     SIGNIFICANT EVENTS:  05/01/2018>> Admission 05/03/2018>> Witnessed PEA arrest with 36 minute ACLS with good CPR. 05/03/2018>> Trach exchange/ very difficult airway STUDIES:   05/03/2018>>CT Abdomen/Pelvis Large left  lower lobe pneumonia is noted, with probable right middle lobe pneumonia as well. CULTURES:  1/3>> Blood 1/3>> Sputum 1/3 Urine for Strep 1/3 Urine for legionella  ANTIBIOTICS:  Levaquin po 05/03/2018>> 05/03/2018 Cefepime 05/03/2018 >>  LINES/TUBES:  L Intr Os 05/03/2018 A-line 1/3>> TLC 1/3>> CONSULTANTS:  05/03/2018 Renal 05/03/2018 ENT SUBJECTIVE:  Unresponsive unable Post Code on Levo at 14  Difficult airway>> #4 uncuffed place at bedside under emergent conditions Now 6.5 ETT per trach stoma  CONSTITUTIONAL: BP (!) 114/58 (BP Location: Right Arm)   Pulse 87   Temp 99.1 F (37.3 C) (Oral)   Resp 20   Ht 5\' 1"  (1.549 m)   Wt 100.8 kg   SpO2 94%   BMI 41.98 kg/m   I/O last 3 completed shifts: In: 240 [P.O.:240] Out: 500 [Urine:500]     FiO2 (%):  [35 %-60 %] 60 %  PHYSICAL EXAM: General:  Unresponsive , post arrest, #4 cuflfess trach Neuro:  Sedated. Paralyzed post arrest HEENT: NCAT, trach as above, thick neck, bloody secretions Cardiovascular:  S1, S2, RRR, no RMG Lungs:  Coarse throughout, bilateral chest excursion, #4 cuffless trach is secure Abdomen:  Obese, BS +, Distended Musculoskeletal:  No obvious deformities, obese extremities Skin:  Warm, dry and intact  RESOLVED PROBLEM LIST  05/03/2018>> PEA arrest ASSESSMENT AND PLAN    Witnessed PEA Arrest 2/2 matabolic derangement ACLS x 36 minutes with ROSC Lactate 1.75 Plan EKG now Trend Lactate Titrate Levo for MAP > 65 Consider cards consult Tele monitoring Trend troponins   Acute on Chronic Respiratory Failure Difficult Airway Chronic Trach displaced requiring emergent replacement 1/3 Cuffless #4 placed emergently 6.5 cuffed ETT placed by ENT 1/3 into trach stoma Home oxygen Sarcoidosis PAH Bleeding from upper airway LLL pneumonia, RML pneumonia Plan: ENT assist to secure cuffed airway ABG now Titrate oxygen and PEEP to maintain sats > 94% Fentanyl for vent synchrony WUA daily Attemp  CPAP/ PS in am  ABG in am CXR in am  And prn Maxipime as ordered  Acute on Chronic Renal Failure Making urine  Fena calculated shows intrinsic renal disease--as a follow-up MRI was attempted because of ultrasound 01/2018 showing mass-CT had to be done instead showing stable cystic mass Hyperkalemia, Lokelma x 1 on 1/2 and treated with insulin/ D 50/ Calcium during arrest Stable 3.1 cm rounded low density is seen in midpole of left kidney Plan Renal Consult>> appreciate assist Trend BMET daily Replete electrolytes daily Maintain renal perfusion ( MAP > 65 mm Hg) Avoid nephrotoxic medications Follow up US kidney in 6 months  ID T max 99.1 WBC 9.1 CXR >> pneumonia LLL and RML High risk aspiration during PEA  arrest Plan Trend fever curve and WBC Cefepime  per pharmacy Pan Culture Follow micro Culture as is clinically indicated Trend  PCT  GI Hepatic cirrhosis Mild splenomegaly Plan SUP TF if she does not wean quickly    SUMMARY OF TODAY'S PLAN:  Pt remains on pressors after PEA arrest.  Airway has been stabilized with 6.5 ETT per trach stoma. ABG, labs pending  Cefepime per pharmacy for pneumonia   Best Practice / Goals of Care / Disposition.   DVT PROPHYLAXIS:heparin ZOX:WRUEAV NUTRITION:NPO for now MOBILITY:BR GOALS OF CARE: Needed FAMILY DISCUSSIONS: Family updated 1/3 by Judson Roch NP and  Dr. Valeta Harms DISPOSITION ICU  LABS  Glucose Recent Labs  Lab 05/02/18 0811 05/02/18 1232 05/02/18 1709 05/02/18 2236 05/03/18 0747 05/03/18 1142  GLUCAP 153* 162* 163* 256* 145* 125*    BMET Recent Labs  Lab 05/01/18 0538  05/02/18 0815 05/02/18 1944 05/03/18 0814 05/03/18 1456  NA 141  --  139  --  135 141  K 6.6*   < > 5.3* 6.2* 6.1* 4.4  CL 111  --  109  --  103 112*  CO2 24  --  25  --  23  --   BUN 38*  --  34*  --  43* 38*  CREATININE 3.22*  --  3.22*  --  4.52* 3.60*  GLUCOSE 202*  --  163*  --  152* 262*   < > = values in this interval not  displayed.    Liver Enzymes Recent Labs  Lab 04/30/18 1902 05/03/18 0814  AST 28  --   ALT 24  --   ALKPHOS 209*  --   BILITOT 0.3  --   ALBUMIN 3.5 3.0*    Electrolytes Recent Labs  Lab 04/30/18 2035 05/01/18 0538 05/02/18 0815 05/03/18 0814  CALCIUM  --  8.7* 8.1* 8.2*  MG 2.4  --   --   --   PHOS  --   --   --  6.2*    CBC Recent Labs  Lab 04/30/18 1902  05/01/18 0538 05/02/18 0815 05/03/18 1456  WBC 4.0  --  4.7 9.1  --   HGB 10.8*   < > 10.3* 9.9* 9.2*  HCT 37.3   < > 36.2 35.2* 27.0*  PLT 162  --  136* 148*  --    < > = values in this interval not displayed.    ABG No results for input(s): PHART, PCO2ART, PO2ART in the last 168 hours.  Coag's No results for input(s): APTT, INR in the last 168 hours.  Sepsis Markers Recent Labs  Lab 05/03/18 1452 05/03/18 1453  LATICACIDVEN 1.72 1.75    Cardiac Enzymes No results for input(s): TROPONINI, PROBNP in the last 168 hours.  PAST MEDICAL HISTORY :   She  has a past medical history of Abnormal LFTs (liver function tests), Acute bronchitis (07/29/2014), Anemia, Cardiomyopathy, nonischemic (HCC), Chronic diastolic CHF (congestive heart failure) ([place]), Chronic respiratory failure ([place]), CKD (chronic kidney disease), stage II, Complication of anesthesia, Diabetes mellitus, Diabetic retinopathy, Essential hypertension, Health maintenance examination, Helicobacter pylori ab+ (05/2011), cardiac cath (2/08), Hypokalemia, Hypomagnesemia, Hypoxemia, Long term current use of systemic steroids, Morbid obesity ([place]), Obesity hypoventilation syndrome ([place]), Pulmonary hypertension ([place]), QT prolongation, Sarcoidosis, Seborrheic dermatitis of scalp, Sleep apnea, and Tobacco abuse.  PAST SURGICAL HISTORY:  She  has a past surgical history that includes Tubal ligation; Breast surgery; Cesarean section; Tracheostomy tube placement (N/A, 08/10/2013); and right heart catheterization (N/A, 08/12/2014).  Allergies  Allergen  Reactions  . Vicodin [Hydrocodone-Acetaminophen] Itching    No current facility-administered medications on file prior to encounter.  Current Outpatient Medications on File Prior to Encounter  Medication Sig  . amLODipine (NORVASC) 10 MG tablet Take 1 tablet (10 mg total) by mouth daily.  . carvedilol (COREG) 6.25 MG tablet Take 1 tablet (6.25 mg total) by mouth 2 (two) times daily.  Marland Kitchen glipiZIDE (GLUCOTROL) 5 MG tablet Take 0.5 tablets (2.5 mg total) by mouth daily before breakfast.  . insulin detemir (LEVEMIR) 100 UNIT/ML injection ADMINISTER 50 UNITS UNDER THE SKIN AT BEDTIME  . liraglutide (VICTOZA) 18 MG/3ML SOPN Inject 0.3 mLs (1.8 mg total) into the skin daily.  . Pseudoephedrine-DM-GG (ROBITUSSIN COLD & COUGH PO) Take 15 mLs by mouth daily as needed (cough).  . ranitidine (ZANTAC) 150 MG tablet Take 1 tablet (150 mg total) by mouth 2 (two) times daily.  Marland Kitchen atorvastatin (LIPITOR) 40 MG tablet Take 1 tablet (40 mg total) by mouth daily. (Patient not taking: Reported on 04/30/2018)  . BD INSULIN SYRINGE U/F 30G X 1/2" 0.5 ML MISC USE AS DIRECTED  . glucose blood test strip Use to check blood sugar 3 times daily. Dx code E11.65. Insulin dependent  . Insulin Pen Needle (B-D UF III MINI PEN NEEDLES) 31G X 5 MM MISC USE TWICE A DAY    FAMILY HISTORY:   Her family history includes Asthma in her sister; Cancer in her mother; Diabetes in her father; Hypertension in an other family member; Multiple sclerosis in her father.  SOCIAL HISTORY:  She  reports that she has been smoking cigarettes. She started smoking about 3 years ago. She has a 6.60 pack-year smoking history. She has never used smokeless tobacco. She reports that she does not drink alcohol or use drugs.  REVIEW OF SYSTEMS:     Intubated and sedated>> Unable   Magdalen Spatz, AGACNP-BC [place] Pager # [phone] After 3 pm [phone] 05/03/2018 3:01 PM

## 2018-05-03 NOTE — Progress Notes (Signed)
   05/03/18 1500  Clinical Encounter Type  Visited With Family  Visit Type Initial;Psychological support;Spiritual support;Code  Referral From Nurse  Consult/Referral To Chaplain  Spiritual Encounters  Spiritual Needs Emotional;Other (Comment) (Spiritual Care Conversation/Support)  Stress Factors  Patient Stress Factors Not reviewed  Family Stress Factors Health changes;Major life changes   I provided support for the patient's daughter, son, god-daughter and sister during a code. The patient's family were dealing with significant grief and anxiety around the patient's declining condition.  The patient's son stated that he did not know what the patient's healthcare wishes were and that they had not discussed who would make her healthcare decisions if she were unable. When I spoke more in depth with him, he stated that he thought that she would want to "keep pushing." The family understands the patient's condition and remains realistic.  I provided spiritual support throughout the code for the family and support once they were taken to the intensive care unit. I will continue to follow this patient as long as she is here at the hospital.   Please, contact Spiritual Care for further assistance.   Chaplain Shanon Ace M.Div., [facility]

## 2018-05-03 NOTE — Consult Note (Addendum)
Referring Physician: Dr Valeta Harms  Patient name: Melody Gray MRN: [id number] DOB: Nov 04, 1970 Sex: female  REASON FOR CONSULT: right leg ischemia with possible compartment syndrome  HPI: Melody Gray is a 48 y.o. female, s/p code at [place] earlier today.  Had interosseous cath in left leg.  After resuscitation pt was noted to have very swollen left leg and unable to find doppler signal.  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  She arrived at [facility] on Vent 4 mcg levophed and heparin drip.  She is not following commands but opens eyes.  She was originally admitted with pneumonia.  Past Medical History:  Diagnosis Date  . Abnormal LFTs (liver function tests)    Liver U/S and exam c/w HSM. Hep B serology neg. but Hep C ab +, HIV neg. 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 Acute bronchitis 07/29/2014  . Anemia   . Cardiomyopathy, nonischemic ([place])    a. Varying EF over the years - initially 35% in 09/2010. Normal cors 12/2010. b. Echo 07/2014: EF 55-60%, no RWMA, + diastolic flattening and systolic flattening c/w RV volume and pressure overload, mild LAE/RAE, mod dilated RV, mod TR, PASP severely increased at 80mmHg. c. RHC 07/2014: moderate pulmonary HTN likely WHO group 3 with markedly elevated CVP and relatively normal left sided pressures.  . Chronic diastolic CHF (congestive heart failure) ([place])   . Chronic respiratory failure ([place])    a. became O2 dependent in July 2011. b. She required trache placement in 07/2013 and has been followed by pulmonology as well.  . CKD (chronic kidney disease), stage II   . Complication of anesthesia    " difficult waking "  . Diabetes mellitus    insulin dependent  . Diabetic retinopathy    Right eye 2/11  . Essential hypertension   . Health maintenance examination    Mammogram  05/2010 Negative; Last Pap smear 03/2008; Last DM eye exam 2/11> mild non-proliferative diabetic retinopathy. OD  . Helicobacter pylori ab+ 05/2011   Pt was symptomatic and treatment planned for 05/2011  . Hx of cardiac cath 2/08   No CAD, no RAS,  normal EF  . Hypokalemia   . Hypomagnesemia   . Hypoxemia    CT angiogram 9/11>> No PE; PFTs 10/11- FEV1 1.20 (49%) with 16% better p B2, DLCO 33%> corrects to 84; O2 sats ok on 4 lpm X rapid walk X 3 laps 05/2010  . Long term current use of systemic steroids   . Morbid obesity ([place])    Target wt= 153 for BMI <30  . Obesity hypoventilation syndrome ([place])   . Pulmonary hypertension ([place])   . QT prolongation   . Sarcoidosis    Followed by Dr. Melvyn Novas; w/ liver involvement per biopsy 12/09, Reversible airway component so started on [facility] 01/2010; HFA 75% p coaching 05/2010  . Seborrheic dermatitis of scalp   . Sleep apnea   . Tobacco abuse    Past Surgical History:  Procedure Laterality Date  . BREAST SURGERY    . CESAREAN SECTION    . RIGHT HEART CATHETERIZATION N/A 08/12/2014   Procedure: RIGHT HEART CATH;  Surgeon: Jolaine Artist, MD;  Location: [facility] CATH LAB;  Service: Cardiovascular;  Laterality: N/A;  . TRACHEOSTOMY TUBE PLACEMENT N/A 08/10/2013   Procedure: TRACHEOSTOMY;  Surgeon: Jenny Reichmann  Janace Hoard, MD;  Location: [place];  Service: ENT;  Laterality: N/A;  . TUBAL LIGATION      Family History  Problem Relation Age of Onset  . Cancer Mother        colon cancer  . Multiple sclerosis Father   . Diabetes Father   . Asthma Sister        in childhood  . Hypertension Other     SOCIAL HISTORY: Social History   Socioeconomic History  . Marital status: Single    Spouse name: Not on file  . Number of children: 2  . Years of education: Not on file  . Highest education level: Not on file  Occupational History  . Occupation: works on a school bus Garment/textile technologist: UNEMPLOYED  Social Needs  . Financial resource strain: Not on file  . Food  insecurity:    Worry: Not on file    Inability: Not on file  . Transportation needs:    Medical: Not on file    Non-medical: Not on file  Tobacco Use  . Smoking status: Current Some Day Smoker    Packs/day: 0.33    Years: 20.00    Pack years: 6.60    Types: Cigarettes    Start date: 10/05/2014  . Smokeless tobacco: Never Used  . Tobacco comment: 1 pack every 3 days  Substance and Sexual Activity  . Alcohol use: No    Alcohol/week: 0.0 standard drinks  . Drug use: No  . Sexual activity: Yes    Partners: Female    Birth control/protection: None  Lifestyle  . Physical activity:    Days per week: Not on file    Minutes per session: Not on file  . Stress: Not on file  Relationships  . Social connections:    Talks on phone: Not on file    Gets together: Not on file    Attends religious service: Not on file    Active member of club or organization: Not on file    Attends meetings of clubs or organizations: Not on file    Relationship status: Not on file  . Intimate partner violence:    Fear of current or ex partner: Not on file    Emotionally abused: Not on file    Physically abused: Not on file    Forced sexual activity: Not on file  Other Topics Concern  . Not on file  Social History Narrative   Diabetic card given 05/03/2010.   Financial assistance approved for 100% discount at [facility] and has [facility] card. [name] 12/07/2009.      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    Allergies  Allergen Reactions  . Vicodin [Hydrocodone-Acetaminophen] Itching    Current Facility-Administered Medications  Medication Dose Route Frequency Provider Last Rate Last Dose  . 0.9 %  sodium chloride infusion  250 mL Intravenous PRN Opyd, Ilene Qua, MD      . 0.9 %  sodium chloride infusion   Intravenous Continuous Aldean Jewett, MD 75 mL/hr at 05/03/18 2048    . 0.9 %  sodium chloride infusion  250 mL Intravenous Continuous Aljishi, Estill Batten Z, MD      .  acetaminophen (TYLENOL) tablet 650 mg  650 mg Oral Q6H PRN Opyd, Ilene Qua, MD       Or  . acetaminophen (TYLENOL) suppository 650 mg  650 mg Rectal Q6H PRN Opyd, Ilene Qua, MD      .  albuterol (PROVENTIL) (2.5 MG/3ML) 0.083% nebulizer solution 2.5 mg  2.5 mg Nebulization Q2H PRN Aljishi, Estill Batten Z, MD      . ceFEPIme (MAXIPIME) 1 g in sodium chloride 0.9 % 100 mL IVPB  1 g Intravenous Q12H Shade, Christine E, RPH 200 mL/hr at 05/03/18 1832 1 g at 05/03/18 1832  . chlorhexidine (PERIDEX) 0.12 % solution 15 mL  15 mL Mouth Rinse BID Opyd, Ilene Qua, MD   15 mL at 05/03/18 0940  . docusate (COLACE) 50 MG/5ML liquid 100 mg  100 mg Per Tube BID PRN Magdalen Spatz, NP      . famotidine (PEPCID) tablet 20 mg  20 mg Oral Daily Opyd, Ilene Qua, MD   20 mg at 05/03/18 0939  . fentaNYL (SUBLIMAZE) bolus via infusion 50 mcg  50 mcg Intravenous Q1H PRN Magdalen Spatz, NP      . fentaNYL (SUBLIMAZE) injection 100 mcg  100 mcg Intravenous Q15 min PRN Aljishi, Virgina Norfolk, MD      . fentaNYL (SUBLIMAZE) injection 100 mcg  100 mcg Intravenous Q2H PRN Aljishi, Virgina Norfolk, MD      . fentaNYL 2551mcg in NS 226mL (14mcg/ml) infusion-PREMIX  25-400 mcg/hr Intravenous Continuous Magdalen Spatz, NP 10 mL/hr at 05/03/18 2046 100 mcg/hr at 05/03/18 2046  . fluticasone (FLONASE) 50 MCG/ACT nasal spray 1 spray  1 spray Each Nare Daily Nita Sells, MD   1 spray at 05/03/18 0940  . insulin aspart (novoLOG) injection 0-5 Units  0-5 Units Subcutaneous QHS Vianne Bulls, MD   3 Units at 05/02/18 2303  . insulin aspart (novoLOG) injection 0-9 Units  0-9 Units Subcutaneous TID WC Opyd, Ilene Qua, MD   1 Units at 05/03/18 1300  . insulin detemir (LEVEMIR) injection 20 Units  20 Units Subcutaneous QHS Vianne Bulls, MD   20 Units at 05/02/18 2259  . MEDLINE mouth rinse  15 mL Mouth Rinse q12n4p Opyd, Ilene Qua, MD   Stopped at 05/03/18 1844  . norepinephrine (LEVOPHED) 4mg  in D5W 249mL premix infusion  0-40 mcg/min Intravenous  Titrated Magdalen Spatz, NP 15 mL/hr at 05/03/18 2045 4 mcg/min at 05/03/18 2045  . ondansetron (ZOFRAN) tablet 4 mg  4 mg Oral Q6H PRN Opyd, Ilene Qua, MD       Or  . ondansetron (ZOFRAN) injection 4 mg  4 mg Intravenous Q6H PRN Opyd, Ilene Qua, MD      . propofol (DIPRIVAN) 1000 MG/100ML infusion  5-80 mcg/kg/min Intravenous Titrated Aldean Jewett, MD 9.07 mL/hr at 05/03/18 2043 15 mcg/kg/min at 05/03/18 2043  . propofol (DIPRIVAN) 1000 MG/100ML infusion           . sodium chloride flush (NS) 0.9 % injection 3 mL  3 mL Intravenous Q12H Opyd, Ilene Qua, MD   3 mL at 05/03/18 1000  . sodium chloride flush (NS) 0.9 % injection 3 mL  3 mL Intravenous Q12H Opyd, Ilene Qua, MD   3 mL at 05/03/18 1000  . sodium chloride flush (NS) 0.9 % injection 3 mL  3 mL Intravenous PRN Opyd, Ilene Qua, MD        ROS:   Unable to obtain pt on vent  Physical Examination  Vitals:   05/03/18 1900 05/03/18 2015 05/03/18 2025 05/03/18 2048  BP:  120/72    Pulse: 79 86    Resp: (!) 28 (!) 28    Temp: (!) 97.5 F (36.4 C)     TempSrc:  SpO2: 90% 95% (!) 88%   Weight:    105.5 kg  Height:    5\' 1"  (1.549 m)    Body mass index is 43.95 kg/m.  General:  Opens eyes Extremity Pulses:  2+ radial, brachial, femoral, dorsalis pedis bilaterally palpable bi to triphasic right biphasic left, 2+ right posterior tibial pulse absent left PT pulse but has biphasic doppler bilat  Musculoskeletal: No deformity edematous left leg from knee to ankle with tight compartments left 50% larger than right   Neurologic: does not follow commands or move extremities  DATA:  CBC    Component Value Date/Time   WBC 10.1 05/03/2018 1608   RBC 3.10 (L) 05/03/2018 1608   HGB 9.1 (L) 05/03/2018 1608   HCT 32.5 (L) 05/03/2018 1608   HCT 34.4 09/11/2014 1010   PLT 115 (L) 05/03/2018 1608   MCV 104.8 (H) 05/03/2018 1608   MCH 29.4 05/03/2018 1608   MCHC 28.0 (L) 05/03/2018 1608   RDW 12.5 05/03/2018 1608   LYMPHSABS 1.1  05/02/2018 0815   MONOABS 0.7 05/02/2018 0815   EOSABS 0.2 05/02/2018 0815   BASOSABS 0.0 05/02/2018 0815    BMET    Component Value Date/Time   NA 140 05/03/2018 1608   NA 140 02/04/2018 1141   K 5.5 (H) 05/03/2018 1608   CL 108 05/03/2018 1608   CO2 26 05/03/2018 1608   GLUCOSE 228 (H) 05/03/2018 1608   BUN 42 (H) 05/03/2018 1608   BUN 31 (H) 02/04/2018 1141   CREATININE 4.67 (H) 05/03/2018 1608   CREATININE 1.90 (H) 01/06/2016 1317   CALCIUM 8.5 (L) 05/03/2018 1608   CALCIUM 9.5 07/28/2010 1133   GFRNONAA 10 (L) 05/03/2018 1608   GFRNONAA 55 (L) 09/25/2014 1350   GFRAA 12 (L) 05/03/2018 1608   GFRAA 63 09/25/2014 1350     ASSESSMENT:  Left leg compartment syndrome.  No arterial compromise on my exam with palpable pedal pulses and doppler signals   PLAN:  To OR for left leg fasciotomy family informed of risk benefits complications including possible limb  loss   D/c heparin drip    Ruta Hinds, MD [facility] Office: [phone] Pager: [phone]

## 2018-05-03 NOTE — Procedures (Signed)
Arterial Catheter Insertion Procedure Note Melody Gray [id number] August 03, 1970  Procedure: Insertion of Arterial Catheter  Indications: Blood pressure monitoring  Procedure Details Consent: Risks of procedure as well as the alternatives and risks of each were explained to the (patient/caregiver).  Consent for procedure obtained. Time Out: Verified patient identification, verified procedure, site/side was marked, verified correct patient position, special equipment/implants available, medications/allergies/relevent history reviewed, required imaging and test results available.  Performed  Maximum sterile technique was used including antiseptics, cap, gloves, gown, hand hygiene and mask. Skin prep: Chlorhexidine; local anesthetic administered 20 gauge catheter was inserted into right radial artery using the Seldinger technique. ULTRASOUND GUIDANCE USED: YES Evaluation Blood flow good; BP tracing good. Complications: No apparent complications.   Tamera Reason 05/03/2018

## 2018-05-03 NOTE — Consult Note (Signed)
Reason for Consult: Tracheostomy problem Referring Physician: Nita Sells, MD  Melody Gray is an 48 y.o. female.  HPI: Long-term tracheostomy for severe obstructive sleep apnea and morbid obesity, had cardiovascular event today, coated, and it was necessary to ventilate with a cuffed tracheostomy tube.  She normally has a #4 cuffless tube.  Pulmonary critical care and anesthesia were unable to intubate from above or to replace with a cuffed tracheostomy tube.  They have been able to replace the #4 cuffless and ventilate through that but are unable to ventilate with a ventilator.  Saturations have been maintained.  No other history is known.  Past Medical History:  Diagnosis Date  . Abnormal LFTs (liver function tests)    Liver U/S and exam c/w HSM. Hep B serology neg. but Hep C ab +, HIV neg. 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 Acute bronchitis 07/29/2014  . Anemia   . Cardiomyopathy, nonischemic ([place])    a. Varying EF over the years - initially 35% in 09/2010. Normal cors 12/2010. b. Echo 07/2014: EF 55-60%, no RWMA, + diastolic flattening and systolic flattening c/w RV volume and pressure overload, mild LAE/RAE, mod dilated RV, mod TR, PASP severely increased at 65mmHg. c. RHC 07/2014: moderate pulmonary HTN likely WHO group 3 with markedly elevated CVP and relatively normal left sided pressures.  . Chronic diastolic CHF (congestive heart failure) ([place])   . Chronic respiratory failure ([place])    a. became O2 dependent in July 2011. b. She required trache placement in 07/2013 and has been followed by pulmonology as well.  . CKD (chronic kidney disease), stage II   . Complication of anesthesia    " difficult waking "  . Diabetes mellitus    insulin dependent  . Diabetic retinopathy    Right eye 2/11  . Essential hypertension   . Health maintenance examination    Mammogram 05/2010 Negative; Last Pap smear 03/2008; Last DM  eye exam 2/11> mild non-proliferative diabetic retinopathy. OD  . Helicobacter pylori ab+ 05/2011   Pt was symptomatic and treatment planned for 05/2011  . Hx of cardiac cath 2/08   No CAD, no RAS,  normal EF  . Hypokalemia   . Hypomagnesemia   . Hypoxemia    CT angiogram 9/11>> No PE; PFTs 10/11- FEV1 1.20 (49%) with 16% better p B2, DLCO 33%> corrects to 84; O2 sats ok on 4 lpm X rapid walk X 3 laps 05/2010  . Long term current use of systemic steroids   . Morbid obesity ([place])    Target wt= 153 for BMI <30  . Obesity hypoventilation syndrome ([place])   . Pulmonary hypertension ([place])   . QT prolongation   . Sarcoidosis    Followed by Dr. Melvyn Novas; w/ liver involvement per biopsy 12/09, Reversible airway component so started on [facility] 01/2010; HFA 75% p coaching 05/2010  . Seborrheic dermatitis of scalp   . Sleep apnea   . Tobacco abuse     Past Surgical History:  Procedure Laterality Date  . BREAST SURGERY    . CESAREAN SECTION    . RIGHT HEART CATHETERIZATION N/A 08/12/2014   Procedure: RIGHT HEART CATH;  Surgeon: Jolaine Artist, MD;  Location: [facility] C[facility] LAB;  Service: Cardiovascular;  Laterality: N/A;  . TRACHEOSTOMY TUBE PLACEMENT N/A 08/10/2013   Procedure: TRACHEOSTOMY;  Surgeon: Melissa Montane, MD;  Location: [place];  Service: ENT;  Laterality: N/A;  . TUBAL  LIGATION      Family History  Problem Relation Age of Onset  . Cancer Mother        colon cancer  . Multiple sclerosis Father   . Diabetes Father   . Asthma Sister        in childhood  . Hypertension Other     Social History:  reports that she has been smoking cigarettes. She started smoking about 3 years ago. She has a 6.60 pack-year smoking history. She has never used smokeless tobacco. She reports that she does not drink alcohol or use drugs.  Allergies:  Allergies  Allergen Reactions  . Vicodin [Hydrocodone-Acetaminophen] Itching    Medications: Reviewed  Results for orders placed or performed during the hospital  encounter of 04/30/18 (from the past 48 hour(s))  Glucose, capillary     Status: Abnormal   Collection Time: 05/01/18  4:40 PM  Result Value Ref Range   Glucose-Capillary 254 (H) 70 - 99 mg/dL  Potassium     Status: Abnormal   Collection Time: 05/01/18  7:42 PM  Result Value Ref Range   Potassium 5.9 (H) 3.5 - 5.1 mmol/L    Comment: Performed at [facility], [place] [address] [place], [place] [address]  Glucose, capillary     Status: Abnormal   Collection Time: 05/01/18  9:16 PM  Result Value Ref Range   Glucose-Capillary 106 (H) 70 - 99 mg/dL  Glucose, capillary     Status: Abnormal   Collection Time: 05/02/18  8:11 AM  Result Value Ref Range   Glucose-Capillary 153 (H) 70 - 99 mg/dL  CBC with Differential/Platelet     Status: Abnormal   Collection Time: 05/02/18  8:15 AM  Result Value Ref Range   WBC 9.1 4.0 - 10.5 K/uL   RBC 3.38 (L) 3.87 - 5.11 MIL/uL   Hemoglobin 9.9 (L) 12.0 - 15.0 g/dL   HCT 35.2 (L) 36.0 - 46.0 %   MCV 104.1 (H) 80.0 - 100.0 fL   MCH 29.3 26.0 - 34.0 pg   MCHC 28.1 (L) 30.0 - 36.0 g/dL   RDW 12.7 11.5 - 15.5 %   Platelets 148 (L) 150 - 400 K/uL   nRBC 0.0 0.0 - 0.2 %   Neutrophils Relative % 77 %   Neutro Abs 7.0 1.7 - 7.7 K/uL   Lymphocytes Relative 12 %   Lymphs Abs 1.1 0.7 - 4.0 K/uL   Monocytes Relative 8 %   Monocytes Absolute 0.7 0.1 - 1.0 K/uL   Eosinophils Relative 2 %   Eosinophils Absolute 0.2 0.0 - 0.5 K/uL   Basophils Relative 0 %   Basophils Absolute 0.0 0.0 - 0.1 K/uL   Immature Granulocytes 1 %   Abs Immature Granulocytes 0.10 (H) 0.00 - 0.07 K/uL    Comment: Performed at [facility], [place] [address] [place], [place] [address]  Basic metabolic panel     Status: Abnormal   Collection Time: 05/02/18  8:15 AM  Result Value Ref Range   Sodium 139 135 - 145 mmol/L   Potassium 5.3 (H) 3.5 - 5.1 mmol/L   Chloride 109 98 - 111 mmol/L   CO2 25 22 - 32 mmol/L   Glucose, Bld 163 (H) 70 - 99 mg/dL    BUN 34 (H) 6 - 20 mg/dL   Creatinine, Ser 3.22 (H) 0.44 - 1.00 mg/dL   Calcium 8.1 (L) 8.9 - 10.3 mg/dL   GFR calc non Af Amer 16 (L) >60  mL/min   GFR calc Af Amer 19 (L) >60 mL/min   Anion gap 5 5 - 15    Comment: Performed at [facility], [place] [address], [place], [place] [address]  Glucose, capillary     Status: Abnormal   Collection Time: 05/02/18 12:32 PM  Result Value Ref Range   Glucose-Capillary 162 (H) 70 - 99 mg/dL  Glucose, capillary     Status: Abnormal   Collection Time: 05/02/18  5:09 PM  Result Value Ref Range   Glucose-Capillary 163 (H) 70 - 99 mg/dL  Potassium     Status: Abnormal   Collection Time: 05/02/18  7:44 PM  Result Value Ref Range   Potassium 6.2 (H) 3.5 - 5.1 mmol/L    Comment: Performed at [facility], [place] [address] [place], [place] [address]  Glucose, capillary     Status: Abnormal   Collection Time: 05/02/18 10:36 PM  Result Value Ref Range   Glucose-Capillary 256 (H) 70 - 99 mg/dL  Glucose, capillary     Status: Abnormal   Collection Time: 05/03/18  7:47 AM  Result Value Ref Range   Glucose-Capillary 145 (H) 70 - 99 mg/dL  Renal function panel     Status: Abnormal   Collection Time: 05/03/18  8:14 AM  Result Value Ref Range   Sodium 135 135 - 145 mmol/L   Potassium 6.1 (H) 3.5 - 5.1 mmol/L   Chloride 103 98 - 111 mmol/L   CO2 23 22 - 32 mmol/L   Glucose, Bld 152 (H) 70 - 99 mg/dL   BUN 43 (H) 6 - 20 mg/dL   Creatinine, Ser 4.52 (H) 0.44 - 1.00 mg/dL   Calcium 8.2 (L) 8.9 - 10.3 mg/dL   Phosphorus 6.2 (H) 2.5 - 4.6 mg/dL   Albumin 3.0 (L) 3.5 - 5.0 g/dL   GFR calc non Af Amer 11 (L) >60 mL/min   GFR calc Af Amer 12 (L) >60 mL/min   Anion gap 9 5 - 15    Comment: Performed at [facility], [place] [address] [place],  [address]  Glucose, capillary     Status: Abnormal   Collection Time: 05/03/18 11:42 AM  Result Value Ref Range   Glucose-Capillary 125 (H) 70 - 99 mg/dL  POCT  i-Stat troponin I     Status: None   Collection Time: 05/03/18  2:50 PM  Result Value Ref Range   Troponin i, poc 0.02 0.00 - 0.08 ng/mL   Comment 3            Comment: Due to the release kinetics of cTnI, a negative result within the first hours of the onset of symptoms does not rule out myocardial infarction with certainty. If myocardial infarction is still suspected, repeat the test at appropriate intervals.   CG4 I-STAT (Lactic acid)     Status: None   Collection Time: 05/03/18  2:52 PM  Result Value Ref Range   Lactic Acid, Venous 1.72 0.5 - 1.9 mmol/L  CG4 I-STAT (Lactic acid)     Status: None   Collection Time: 05/03/18  2:53 PM  Result Value Ref Range   Lactic Acid, Venous 1.75 0.5 - 1.9 mmol/L  I-STAT, chem 8     Status: Abnormal   Collection Time: 05/03/18  2:56 PM  Result Value Ref Range   Sodium 141 135 - 145 mmol/L   Potassium 4.4 3.5 - 5.1 mmol/L   Chloride 112 (H) 98 - 111 mmol/L  BUN 38 (H) 6 - 20 mg/dL   Creatinine, Ser 3.60 (H) 0.44 - 1.00 mg/dL   Glucose, Bld 262 (H) 70 - 99 mg/dL   Calcium, Ion 1.30 1.15 - 1.40 mmol/L   TCO2 23 22 - 32 mmol/L   Hemoglobin 9.2 (L) 12.0 - 15.0 g/dL   HCT 27.0 (L) 36.0 - 46.0 %    Ct Abdomen Pelvis Wo Contrast  Result Date: 05/03/2018 CLINICAL DATA:  Renal mass. EXAM: CT ABDOMEN AND PELVIS WITHOUT CONTRAST TECHNIQUE: Multidetector CT imaging of the abdomen and pelvis was performed following the standard protocol without IV contrast. COMPARISON:  Ultrasound of January 31, 2018.  CT scan of Sep 06, 2017. FINDINGS: Lower chest: There is now noted opacification of the left lung base consistent with large pneumonia. Right middle lobe opacity is also noted concerning for pneumonia. Hepatobiliary: Cholelithiasis is noted. Nodular hepatic contours are noted consistent with cirrhosis. No biliary dilatation is noted. Pancreas: Unremarkable. No pancreatic ductal dilatation or surrounding inflammatory changes. Spleen: Mild splenomegaly is  noted. Adrenals/Urinary Tract: Adrenal glands appear normal. Stable 3.1 cm low density is noted in midpole of left kidney consistent with cystic abnormality seen on prior ultrasound. No hydronephrosis or renal obstruction is noted. No renal or ureteral calculi are noted. Urinary bladder is unremarkable. Stomach/Bowel: Stomach is within normal limits. Appendix appears normal. No evidence of bowel wall thickening, distention, or inflammatory changes. Sigmoid diverticulosis is noted. Vascular/Lymphatic: Aortic atherosclerosis. No enlarged abdominal or pelvic lymph nodes. Reproductive: Uterus and bilateral adnexa are unremarkable. Other: No abdominal wall hernia or abnormality. No abdominopelvic ascites. Musculoskeletal: No acute or significant osseous findings. IMPRESSION: Large left lower lobe pneumonia is noted, with probable right middle lobe pneumonia as well. 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 Stable 3.1 cm rounded low density is seen in midpole of left kidney corresponding with predominantly cystic abnormality seen on prior ultrasound and CT scan. If patient is unable to undergo MRI, continued follow-up with ultrasound in 6 months is recommended. Aortic Atherosclerosis (ICD10-I70.0). Electronically Signed   By: Marijo Conception, M.D.   On: 05/03/2018 11:52    TIR:[id number] except as listed in admit H&P  Blood pressure (!) 116/56, pulse 94, temperature 99.1 F (37.3 C), temperature source Oral, resp. rate 17, height 5\' 1"  (1.549 m), weight 100.8 kg, SpO2 (!) 89 %.  PHYSICAL EXAM: Overall appearance: Chronically ill-appearing obese lady, being ventilated through tracheostomy tube using Ambu bag. Head:  Normocephalic, atraumatic. Ears: External ears look healthy. Nose: External nose is healthy in appearance. Internal nasal exam free of any lesions or obstruction. Oral Cavity/Pharynx:  There are no mucosal lesions or masses identified.  There is some bloody secretions. Larynx/Hypopharynx:  Deferred Neuro: Unable to evaluate. Neck: No palpable neck masses.  #4 cuffless tracheostomy in place.  I was able to pass a suction cannula and suction out thick secretions.  I was able to pass a fiberoptic scope through the tracheostomy and see the carina.  There is nothing obstructing the tracheal airway.  Studies Reviewed: none  Procedures: Fiberoptic endoscopy through the tracheostomy tube.  Findings described above.  The #4 cuffless tracheostomy was removed and a #4 cuffed tracheostomy tube was attempted but I was unable to pass through the stoma.  I then passed a 6.5 endotracheal tube through the stoma without difficulty.  We were able to ventilate easily through there.  This was secured in place using adhesive dressing.  No further problems were encountered.   Assessment/Plan: Difficult airway, tracheostomy replaced  with a cuffed endotracheal tube without difficulty.  Saturations maintained.  Airway is now secure.  I will be available to return when the clinical situation allows for replacement to an uncuffed tracheostomy tube.  Izora Gala 05/03/2018, 4:36 PM

## 2018-05-03 NOTE — Progress Notes (Signed)
Pt coded with 4 cuffless after some normal rhythm restored Md attempted to change trach unsuccessfully. .MD unable to place 6 or 4 cuffed trach in airway.4 cuffless was replaced, CRNA and MD called. Oral intubation was attempted and was unsuccessful. ENT MD called. 6.5 ET tube placed in stoma by ent doctor. ETco2 confirmation of tube placement. Bilateral breath sonnds equal. Pt placed on ventilator.

## 2018-05-03 NOTE — Consult Note (Signed)
Reason for Consult: Moderately Complex Left Renal Cyst, Chronic Renal Failure  Referring Physician: Verneita Griffes MD  Melody Gray is an 48 y.o. female.   HPI:   1 - Moderately Complex Left Renal Cyst - Left lower mid 100% endophytic 3cm cyst by [name] and non-con CT 08/2017, new from 2016. No change by CT 05/2018. Appears 1 artery / 1 vein (lumbar inferior to artery) left renovascular anatomy. She cannot tollerate MRI and cannot have IV contrast. No mass effect.   2 - Chronic Renal Failure - Increasing Cr x several years. H/o sarcoid, DM2 with A1c 8's, UA with large proteinuria x several. Most recent baseline 2-3, now 3-4 early 2020. Renal imaging x several including this admission w/o hydro.   PMH sig for morbid obesity, DM2 (A1c 8s), Obesity hypoventilation / trach, Sarcoid, cardiomyopathy.  Today "Melody Gray" is seen in consultation for left renal cyst. She unfortunately had prolonged code blue today and is now in ICU on vent and has cold lower extremity.   Past Medical History:  Diagnosis Date  . Abnormal LFTs (liver function tests)    Liver U/S and exam c/w HSM. Hep B serology neg. but Hep C ab +, HIV neg. 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 Acute bronchitis 07/29/2014  . Anemia   . Cardiomyopathy, nonischemic ([place])    a. Varying EF over the years - initially 35% in 09/2010. Normal cors 12/2010. b. Echo 07/2014: EF 55-60%, no RWMA, + diastolic flattening and systolic flattening c/w RV volume and pressure overload, mild LAE/RAE, mod dilated RV, mod TR, PASP severely increased at 74mmHg. c. RHC 07/2014: moderate pulmonary HTN likely WHO group 3 with markedly elevated CVP and relatively normal left sided pressures.  . Chronic diastolic CHF (congestive heart failure) ([place])   . Chronic respiratory failure ([place])    a. became O2 dependent in July 2011. b. She required trache placement in 07/2013 and has been followed by pulmonology as well.   . CKD (chronic kidney disease), stage II   . Complication of anesthesia    " difficult waking "  . Diabetes mellitus    insulin dependent  . Diabetic retinopathy    Right eye 2/11  . Essential hypertension   . Health maintenance examination    Mammogram 05/2010 Negative; Last Pap smear 03/2008; Last DM eye exam 2/11> mild non-proliferative diabetic retinopathy. OD  . Helicobacter pylori ab+ 05/2011   Pt was symptomatic and treatment planned for 05/2011  . Hx of cardiac cath 2/08   No CAD, no RAS,  normal EF  . Hypokalemia   . Hypomagnesemia   . Hypoxemia    CT angiogram 9/11>> No PE; PFTs 10/11- FEV1 1.20 (49%) with 16% better p B2, DLCO 33%> corrects to 84; O2 sats ok on 4 lpm X rapid walk X 3 laps 05/2010  . Long term current use of systemic steroids   . Morbid obesity ([place])    Target wt= 153 for BMI <30  . Obesity hypoventilation syndrome ([place])   . Pulmonary hypertension ([place])   . QT prolongation   . Sarcoidosis    Followed by Dr. Melvyn Novas; w/ liver involvement per biopsy 12/09, Reversible airway component so started on [facility] 01/2010; HFA 75% p coaching 05/2010  . Seborrheic dermatitis of scalp   . Sleep apnea   . Tobacco abuse     Past Surgical History:  Procedure Laterality Date  . BREAST SURGERY    .  CESAREAN SECTION    . RIGHT HEART CATHETERIZATION N/A 08/12/2014   Procedure: RIGHT HEART CATH;  Surgeon: Jolaine Artist, MD;  Location[facility] CATH LAB;  Service: Cardiovascular;  Laterality: N/A;  . TRACHEOSTOMY TUBE PLACEMENT N/A 08/10/2013   Procedure: TRACHEOSTOMY;  Surgeon: Melissa Montane, MD;  Location[facility] OR;  Service: ENT;  Laterality: N/A;  . TUBAL LIGATION      Family History  Problem Relation Age of Onset  . Cancer Mother        colon cancer  . Multiple sclerosis Father   . Diabetes Father   . Asthma Sister        in childhood  . Hypertension Other     Social History:  reports that she has been smoking cigarettes. She started smoking about 3 years ago. She has a  6.60 pack-year smoking history. She has never used smokeless tobacco. She reports that she does not drink alcohol or use drugs.  Allergies:  Allergies  Allergen Reactions  . Vicodin [Hydrocodone-Acetaminophen] Itching    Medications: I have reviewed the patient's current medications.  Results for orders placed or performed during the hospital encounter of 04/30/18 (from the past 48 hour(s))  Potassium     Status: Abnormal   Collection Time: 05/01/18  7:42 PM  Result Value Ref Range   Potassium 5.9 (H) 3.5 - 5.1 mmol/L    Comment: Performed at [facility], [place] [address], [place], [place] [address]  Glucose, capillary     Status: Abnormal   Collection Time: 05/01/18  9:16 PM  Result Value Ref Range   Glucose-Capillary 106 (H) 70 - 99 mg/dL  Glucose, capillary     Status: Abnormal   Collection Time: 05/02/18  8:11 AM  Result Value Ref Range   Glucose-Capillary 153 (H) 70 - 99 mg/dL  CBC with Differential/Platelet     Status: Abnormal   Collection Time: 05/02/18  8:15 AM  Result Value Ref Range   WBC 9.1 4.0 - 10.5 K/uL   RBC 3.38 (L) 3.87 - 5.11 MIL/uL   Hemoglobin 9.9 (L) 12.0 - 15.0 g/dL   HCT 35.2 (L) 36.0 - 46.0 %   MCV 104.1 (H) 80.0 - 100.0 fL   MCH 29.3 26.0 - 34.0 pg   MCHC 28.1 (L) 30.0 - 36.0 g/dL   RDW 12.7 11.5 - 15.5 %   Platelets 148 (L) 150 - 400 K/uL   nRBC 0.0 0.0 - 0.2 %   Neutrophils Relative % 77 %   Neutro Abs 7.0 1.7 - 7.7 K/uL   Lymphocytes Relative 12 %   Lymphs Abs 1.1 0.7 - 4.0 K/uL   Monocytes Relative 8 %   Monocytes Absolute 0.7 0.1 - 1.0 K/uL   Eosinophils Relative 2 %   Eosinophils Absolute 0.2 0.0 - 0.5 K/uL   Basophils Relative 0 %   Basophils Absolute 0.0 0.0 - 0.1 K/uL   Immature Granulocytes 1 %   Abs Immature Granulocytes 0.10 (H) 0.00 - 0.07 K/uL    Comment: Performed at [facility], [place] [address], [place], [place] [address]  Basic metabolic panel     Status: Abnormal   Collection Time:  05/02/18  8:15 AM  Result Value Ref Range   Sodium 139 135 - 145 mmol/L   Potassium 5.3 (H) 3.5 - 5.1 mmol/L   Chloride 109 98 - 111 mmol/L   CO2 25 22 - 32 mmol/L   Glucose, Bld 163 (H) 70 - 99 mg/dL  BUN 34 (H) 6 - 20 mg/dL   Creatinine, Ser 3.22 (H) 0.44 - 1.00 mg/dL   Calcium 8.1 (L) 8.9 - 10.3 mg/dL   GFR calc non Af Amer 16 (L) >60 mL/min   GFR calc Af Amer 19 (L) >60 mL/min   Anion gap 5 5 - 15    Comment: Performed at [facility], [place] [address], [place], [place] [address]  Glucose, capillary     Status: Abnormal   Collection Time: 05/02/18 12:32 PM  Result Value Ref Range   Glucose-Capillary 162 (H) 70 - 99 mg/dL  Glucose, capillary     Status: Abnormal   Collection Time: 05/02/18  5:09 PM  Result Value Ref Range   Glucose-Capillary 163 (H) 70 - 99 mg/dL  Potassium     Status: Abnormal   Collection Time: 05/02/18  7:44 PM  Result Value Ref Range   Potassium 6.2 (H) 3.5 - 5.1 mmol/L    Comment: Performed at [facility], [place] [address], [place], [place] [address]  Glucose, capillary     Status: Abnormal   Collection Time: 05/02/18 10:36 PM  Result Value Ref Range   Glucose-Capillary 256 (H) 70 - 99 mg/dL  Glucose, capillary     Status: Abnormal   Collection Time: 05/03/18  7:47 AM  Result Value Ref Range   Glucose-Capillary 145 (H) 70 - 99 mg/dL  Renal function panel     Status: Abnormal   Collection Time: 05/03/18  8:14 AM  Result Value Ref Range   Sodium 135 135 - 145 mmol/L   Potassium 6.1 (H) 3.5 - 5.1 mmol/L   Chloride 103 98 - 111 mmol/L   CO2 23 22 - 32 mmol/L   Glucose, Bld 152 (H) 70 - 99 mg/dL   BUN 43 (H) 6 - 20 mg/dL   Creatinine, Ser 4.52 (H) 0.44 - 1.00 mg/dL   Calcium 8.2 (L) 8.9 - 10.3 mg/dL   Phosphorus 6.2 (H) 2.5 - 4.6 mg/dL   Albumin 3.0 (L) 3.5 - 5.0 g/dL   GFR calc non Af Amer 11 (L) >60 mL/min   GFR calc Af Amer 12 (L) >60 mL/min   Anion gap 9 5 - 15    Comment: Performed at [facility], [place] [address], [place], [place] [address]  Glucose, capillary     Status: Abnormal   Collection Time: 05/03/18 11:42 AM  Result Value Ref Range   Glucose-Capillary 125 (H) 70 - 99 mg/dL  POCT i-Stat troponin I     Status: None   Collection Time: 05/03/18  2:50 PM  Result Value Ref Range   Troponin i, poc 0.02 0.00 - 0.08 ng/mL   Comment 3            Comment: Due to the release kinetics of cTnI, a negative result within the first hours of the onset of symptoms does not rule out myocardial infarction with certainty. If myocardial infarction is still suspected, repeat the test at appropriate intervals.   CG4 I-STAT (Lactic acid)     Status: None   Collection Time: 05/03/18  2:52 PM  Result Value Ref Range   Lactic Acid, Venous 1.72 0.5 - 1.9 mmol/L  CG4 I-STAT (Lactic acid)     Status: None   Collection Time: 05/03/18  2:53 PM  Result Value Ref Range   Lactic Acid, Venous 1.75 0.5 - 1.9 mmol/L  I-STAT, chem 8     Status: Abnormal  Collection Time: 05/03/18  2:56 PM  Result Value Ref Range   Sodium 141 135 - 145 mmol/L   Potassium 4.4 3.5 - 5.1 mmol/L   Chloride 112 (H) 98 - 111 mmol/L   BUN 38 (H) 6 - 20 mg/dL   Creatinine, Ser 3.60 (H) 0.44 - 1.00 mg/dL   Glucose, Bld 262 (H) 70 - 99 mg/dL   Calcium, Ion 1.30 1.15 - 1.40 mmol/L   TCO2 23 22 - 32 mmol/L   Hemoglobin 9.2 (L) 12.0 - 15.0 g/dL   HCT 27.0 (L) 36.0 - 46.0 %  Comprehensive metabolic panel     Status: Abnormal   Collection Time: 05/03/18  4:08 PM  Result Value Ref Range   Sodium 140 135 - 145 mmol/L   Potassium 5.5 (H) 3.5 - 5.1 mmol/L    Comment: DELTA CHECK NOTED NO VISIBLE HEMOLYSIS    Chloride 108 98 - 111 mmol/L   CO2 26 22 - 32 mmol/L   Glucose, Bld 228 (H) 70 - 99 mg/dL   BUN 42 (H) 6 - 20 mg/dL   Creatinine, Ser 4.67 (H) 0.44 - 1.00 mg/dL   Calcium 8.5 (L) 8.9 - 10.3 mg/dL   Total Protein 6.5 6.5 - 8.1 g/dL   Albumin 2.6 (L) 3.5 - 5.0 g/dL   AST 110 (H) 15 - 41 U/L   ALT 82 (H) 0 - 44  U/L   Alkaline Phosphatase 224 (H) 38 - 126 U/L   Total Bilirubin 0.6 0.3 - 1.2 mg/dL   GFR calc non Af Amer 10 (L) >60 mL/min   GFR calc Af Amer 12 (L) >60 mL/min   Anion gap 6 5 - 15    Comment: Performed at [facility], [place] [address] [place], [place] [address]  Lactic acid, plasma     Status: None   Collection Time: 05/03/18  4:08 PM  Result Value Ref Range   Lactic Acid, Venous 1.2 0.5 - 1.9 mmol/L    Comment: Performed at [facility], [place] [address] [place], [place] [address]  CBC     Status: Abnormal   Collection Time: 05/03/18  4:08 PM  Result Value Ref Range   WBC 10.1 4.0 - 10.5 K/uL   RBC 3.10 (L) 3.87 - 5.11 MIL/uL   Hemoglobin 9.1 (L) 12.0 - 15.0 g/dL   HCT 32.5 (L) 36.0 - 46.0 %   MCV 104.8 (H) 80.0 - 100.0 fL   MCH 29.4 26.0 - 34.0 pg   MCHC 28.0 (L) 30.0 - 36.0 g/dL   RDW 12.5 11.5 - 15.5 %   Platelets 115 (L) 150 - 400 K/uL    Comment: REPEATED TO VERIFY PLATELET COUNT CONFIRMED BY SMEAR SPECIMEN CHECKED FOR CLOTS Immature Platelet Fraction may be clinically indicated, consider ordering this additional test [id number]    nRBC 0.0 0.0 - 0.2 %    Comment: Performed at [facility], [place] [address] [place], [place] [address]  Procalcitonin - Baseline     Status: None   Collection Time: 05/03/18  4:08 PM  Result Value Ref Range   Procalcitonin 0.42 ng/mL    Comment:        Interpretation: PCT (Procalcitonin) <= 0.5 ng/mL: Systemic infection (sepsis) is not likely. Local bacterial infection is possible. (NOTE)       Sepsis PCT Algorithm           Lower Respiratory Tract  Infection PCT Algorithm    ----------------------------     ----------------------------         PCT < 0.25 ng/mL                PCT < 0.10 ng/mL         Strongly encourage             Strongly discourage   discontinuation of antibiotics    initiation of antibiotics     ----------------------------     -----------------------------       PCT 0.25 - 0.50 ng/mL            PCT 0.10 - 0.25 ng/mL               OR       >80% decrease in PCT            Discourage initiation of                                            antibiotics      Encourage discontinuation           of antibiotics    ----------------------------     -----------------------------         PCT >= 0.50 ng/mL              PCT 0.26 - 0.50 ng/mL               AND        <80% decrease in PCT             Encourage initiation of                                             antibiotics       Encourage continuation           of antibiotics    ----------------------------     -----------------------------        PCT >= 0.50 ng/mL                  PCT > 0.50 ng/mL               AND         increase in PCT                  Strongly encourage                                      initiation of antibiotics    Strongly encourage escalation           of antibiotics                                     -----------------------------                                           PCT <= 0.25 ng/mL  OR                                        > 80% decrease in PCT                                     Discontinue / Do not initiate                                             antibiotics Performed at [place] [address], [place], [place] [address]   Blood gas, arterial     Status: Abnormal   Collection Time: 05/03/18  5:33 PM  Result Value Ref Range   FIO2 1.00    Delivery systems VENTILATOR    Mode PRESSURE REGULATED VOLUME CONTROL    VT 360 mL   LHR 25 resp/min   Peep/cpap 5.0 cm H20   pH, Arterial 7.196 (LL) 7.350 - 7.450    Comment: CRITICAL RESULT CALLED TO, READ BACK BY AND VERIFIED WITH: dr Valeta Harms by Eben Burow rrt rcp on 05/03/18 at 1735    pCO2 arterial 58.4 (H) 32.0 - 48.0 mmHg   pO2, Arterial 48.0 (L) 83.0 - 108.0 mmHg    Bicarbonate 22.3 20.0 - 28.0 mmol/L   Acid-base deficit 5.9 (H) 0.0 - 2.0 mmol/L   O2 Saturation 85.5 %   Patient temperature 96.0    Collection site LEFT RADIAL    Drawn by [id number]    Sample type ARTERIAL DRAW    Allens test (pass/fail) PASS PASS    Comment: Performed at [facility], [place] [address], [place],  [address]    Ct Abdomen Pelvis Wo Contrast  Result Date: 05/03/2018 CLINICAL DATA:  Renal mass. EXAM: CT ABDOMEN AND PELVIS WITHOUT CONTRAST TECHNIQUE: Multidetector CT imaging of the abdomen and pelvis was performed following the standard protocol without IV contrast. COMPARISON:  Ultrasound of January 31, 2018.  CT scan of Sep 06, 2017. FINDINGS: Lower chest: There is now noted opacification of the left lung base consistent with large pneumonia. Right middle lobe opacity is also noted concerning for pneumonia. Hepatobiliary: Cholelithiasis is noted. Nodular hepatic contours are noted consistent with cirrhosis. No biliary dilatation is noted. Pancreas: Unremarkable. No pancreatic ductal dilatation or surrounding inflammatory changes. Spleen: Mild splenomegaly is noted. Adrenals/Urinary Tract: Adrenal glands appear normal. Stable 3.1 cm low density is noted in midpole of left kidney consistent with cystic abnormality seen on prior ultrasound. No hydronephrosis or renal obstruction is noted. No renal or ureteral calculi are noted. Urinary bladder is unremarkable. Stomach/Bowel: Stomach is within normal limits. Appendix appears normal. No evidence of bowel wall thickening, distention, or inflammatory changes. Sigmoid diverticulosis is noted. Vascular/Lymphatic: Aortic atherosclerosis. No enlarged abdominal or pelvic lymph nodes. Reproductive: Uterus and bilateral adnexa are unremarkable. Other: No abdominal wall hernia or abnormality. No abdominopelvic ascites. Musculoskeletal: No acute or significant osseous findings. IMPRESSION: Large left lower lobe pneumonia is noted, with  probable right middle lobe pneumonia as well. 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 Stable 3.1 cm rounded low density is seen in midpole of left kidney corresponding with predominantly cystic abnormality seen on prior ultrasound and CT scan. If patient  is unable to undergo MRI, continued follow-up with ultrasound in 6 months is recommended. Aortic Atherosclerosis (ICD10-I70.0). Electronically Signed   By: Marijo Conception, M.D.   On: 05/03/2018 11:52    Review of Systems  Unable to perform ROS: Critical illness   Blood pressure 115/60, pulse 86, temperature (!) 92.1 F (33.4 C), resp. rate (!) 25, height 5\' 1"  (1.549 m), weight 100.8 kg, SpO2 93 %. Physical Exam  Constitutional:  Ill appearing in ICU on vent   Cardiovascular: Normal rate.  By bedisde monitor, sinus  Respiratory:  Non-coarse on vent  GI:  morbid truncal obesity limits sensitivity of exam.   Musculoskeletal:     Comments: Undergoing RLE femoral line placement at present.   Neurological:  GCS 3T  Skin: Skin is warm.    Assessment/Plan:  1 - Moderately Complex Left Renal Cyst - DDX benign or early cystic remal malignancy. Given her extreme comorbidity, surveillance first choice. It highly unlikely this will ever become clinically significant.   2 - Chronic Renal Failure - severe and worsening medical renal disease. Very likely she will progress to ESRD with multiple medical renal insults and now hypoxic injury.  Very poor overall even medium term prognosis. This is unfortunate. Palliative transition may make sense fairly soon pending her recovery from low flow / anoxic event.   I will request outpatient GU follow up for her left renal cyst and follow her on PRN basis in house at this point.  Please call me directly with questions anytime.    Alexis Frock 05/03/2018, 6:00 PM

## 2018-05-03 NOTE — Consult Note (Signed)
Reason for Consult: No pulses left lower extremity post placement of intraosseous line during a code Referring Physician: Verlon Au MD  Ronald Lobo Rottenberg is an 48 y.o. female.  HPI: Patient was coded due to loss of airway with low flow state difficulty with previous trach with stage IV kidney disease, morbid obesity, chronic pulmonary hypertension, chronic respiratory failure with hypoxia, cigarette smoking history, type 2 diabetes.  Code lasted longer than 30 minutes with low flow state difficult line access.  She is now stable on the vent with a trach which is functioning and it was noted that her left calf is swollen with no pulses in her ankle.  No attempted access was made in the left groin or femoral artery region during her code.  Past Medical History:  Diagnosis Date  . Abnormal LFTs (liver function tests)    Liver U/S and exam c/w HSM. Hep B serology neg. but Hep C ab +, HIV neg. 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 Acute bronchitis 07/29/2014  . Anemia   . Cardiomyopathy, nonischemic ([place])    a. Varying EF over the years - initially 35% in 09/2010. Normal cors 12/2010. b. Echo 07/2014: EF 55-60%, no RWMA, + diastolic flattening and systolic flattening c/w RV volume and pressure overload, mild LAE/RAE, mod dilated RV, mod TR, PASP severely increased at 51mmHg. c. RHC 07/2014: moderate pulmonary HTN likely WHO group 3 with markedly elevated CVP and relatively normal left sided pressures.  . Chronic diastolic CHF (congestive heart failure) ([place])   . Chronic respiratory failure ([place])    a. became O2 dependent in July 2011. b. She required trache placement in 07/2013 and has been followed by pulmonology as well.  . CKD (chronic kidney disease), stage II   . Complication of anesthesia    " difficult waking "  . Diabetes mellitus    insulin dependent  . Diabetic retinopathy    Right eye 2/11  . Essential hypertension   . Health  maintenance examination    Mammogram 05/2010 Negative; Last Pap smear 03/2008; Last DM eye exam 2/11> mild non-proliferative diabetic retinopathy. OD  . Helicobacter pylori ab+ 05/2011   Pt was symptomatic and treatment planned for 05/2011  . Hx of cardiac cath 2/08   No CAD, no RAS,  normal EF  . Hypokalemia   . Hypomagnesemia   . Hypoxemia    CT angiogram 9/11>> No PE; PFTs 10/11- FEV1 1.20 (49%) with 16% better p B2, DLCO 33%> corrects to 84; O2 sats ok on 4 lpm X rapid walk X 3 laps 05/2010  . Long term current use of systemic steroids   . Morbid obesity ([place])    Target wt= 153 for BMI <30  . Obesity hypoventilation syndrome ([place])   . Pulmonary hypertension ([place])   . QT prolongation   . Sarcoidosis    Followed by Dr. Melvyn Novas; w/ liver involvement per biopsy 12/09, Reversible airway component so started on [facility] 01/2010; HFA 75% p coaching 05/2010  . Seborrheic dermatitis of scalp   . Sleep apnea   . Tobacco abuse     Past Surgical History:  Procedure Laterality Date  . BREAST SURGERY    . CESAREAN SECTION    . RIGHT HEART CATHETERIZATION N/A 08/12/2014   Procedure: RIGHT HEART CATH;  Surgeon: Jolaine Artist, MD;  Location: [facility] CATH LAB;  Service: Cardiovascular;  Laterality: N/A;  . TRACHEOSTOMY TUBE PLACEMENT N/A 08/10/2013  Procedure: TRACHEOSTOMY;  Surgeon: Melissa Montane, MD;  Location: [facility] OR;  Service: ENT;  Laterality: N/A;  . TUBAL LIGATION      Family History  Problem Relation Age of Onset  . Cancer Mother        colon cancer  . Multiple sclerosis Father   . Diabetes Father   . Asthma Sister        in childhood  . Hypertension Other     Social History:  reports that she has been smoking cigarettes. She started smoking about 3 years ago. She has a 6.60 pack-year smoking history. She has never used smokeless tobacco. She reports that she does not drink alcohol or use drugs.  Allergies:  Allergies  Allergen Reactions  . Vicodin [Hydrocodone-Acetaminophen] Itching     Medications: I have reviewed the patient's current medications.  Results for orders placed or performed during the hospital encounter of 04/30/18 (from the past 48 hour(s))  Potassium     Status: Abnormal   Collection Time: 05/01/18  7:42 PM  Result Value Ref Range   Potassium 5.9 (H) 3.5 - 5.1 mmol/L    Comment: Performed at [facility], [place] [address], [place], [place] [address]  Glucose, capillary     Status: Abnormal   Collection Time: 05/01/18  9:16 PM  Result Value Ref Range   Glucose-Capillary 106 (H) 70 - 99 mg/dL  Glucose, capillary     Status: Abnormal   Collection Time: 05/02/18  8:11 AM  Result Value Ref Range   Glucose-Capillary 153 (H) 70 - 99 mg/dL  CBC with Differential/Platelet     Status: Abnormal   Collection Time: 05/02/18  8:15 AM  Result Value Ref Range   WBC 9.1 4.0 - 10.5 K/uL   RBC 3.38 (L) 3.87 - 5.11 MIL/uL   Hemoglobin 9.9 (L) 12.0 - 15.0 g/dL   HCT 35.2 (L) 36.0 - 46.0 %   MCV 104.1 (H) 80.0 - 100.0 fL   MCH 29.3 26.0 - 34.0 pg   MCHC 28.1 (L) 30.0 - 36.0 g/dL   RDW 12.7 11.5 - 15.5 %   Platelets 148 (L) 150 - 400 K/uL   nRBC 0.0 0.0 - 0.2 %   Neutrophils Relative % 77 %   Neutro Abs 7.0 1.7 - 7.7 K/uL   Lymphocytes Relative 12 %   Lymphs Abs 1.1 0.7 - 4.0 K/uL   Monocytes Relative 8 %   Monocytes Absolute 0.7 0.1 - 1.0 K/uL   Eosinophils Relative 2 %   Eosinophils Absolute 0.2 0.0 - 0.5 K/uL   Basophils Relative 0 %   Basophils Absolute 0.0 0.0 - 0.1 K/uL   Immature Granulocytes 1 %   Abs Immature Granulocytes 0.10 (H) 0.00 - 0.07 K/uL    Comment: Performed at [facility], [place] [address] [place], [place] [address]  Basic metabolic panel     Status: Abnormal   Collection Time: 05/02/18  8:15 AM  Result Value Ref Range   Sodium 139 135 - 145 mmol/L   Potassium 5.3 (H) 3.5 - 5.1 mmol/L   Chloride 109 98 - 111 mmol/L   CO2 25 22 - 32 mmol/L   Glucose, Bld 163 (H) 70 - 99 mg/dL   BUN 34 (H) 6 -  20 mg/dL   Creatinine, Ser 3.22 (H) 0.44 - 1.00 mg/dL   Calcium 8.1 (L) 8.9 - 10.3 mg/dL   GFR calc non Af Amer 16 (L) >60 mL/min   GFR calc  Af Amer 19 (L) >60 mL/min   Anion gap 5 5 - 15    Comment: Performed at [facility], [place] [address], [place], [place] [address]  Glucose, capillary     Status: Abnormal   Collection Time: 05/02/18 12:32 PM  Result Value Ref Range   Glucose-Capillary 162 (H) 70 - 99 mg/dL  Glucose, capillary     Status: Abnormal   Collection Time: 05/02/18  5:09 PM  Result Value Ref Range   Glucose-Capillary 163 (H) 70 - 99 mg/dL  Potassium     Status: Abnormal   Collection Time: 05/02/18  7:44 PM  Result Value Ref Range   Potassium 6.2 (H) 3.5 - 5.1 mmol/L    Comment: Performed at [facility], [place] [address] [place], [place] [address]  Glucose, capillary     Status: Abnormal   Collection Time: 05/02/18 10:36 PM  Result Value Ref Range   Glucose-Capillary 256 (H) 70 - 99 mg/dL  Glucose, capillary     Status: Abnormal   Collection Time: 05/03/18  7:47 AM  Result Value Ref Range   Glucose-Capillary 145 (H) 70 - 99 mg/dL  Renal function panel     Status: Abnormal   Collection Time: 05/03/18  8:14 AM  Result Value Ref Range   Sodium 135 135 - 145 mmol/L   Potassium 6.1 (H) 3.5 - 5.1 mmol/L   Chloride 103 98 - 111 mmol/L   CO2 23 22 - 32 mmol/L   Glucose, Bld 152 (H) 70 - 99 mg/dL   BUN 43 (H) 6 - 20 mg/dL   Creatinine, Ser 4.52 (H) 0.44 - 1.00 mg/dL   Calcium 8.2 (L) 8.9 - 10.3 mg/dL   Phosphorus 6.2 (H) 2.5 - 4.6 mg/dL   Albumin 3.0 (L) 3.5 - 5.0 g/dL   GFR calc non Af Amer 11 (L) >60 mL/min   GFR calc Af Amer 12 (L) >60 mL/min   Anion gap 9 5 - 15    Comment: Performed at [facility], [place] [address] [place], [place] [address]  Glucose, capillary     Status: Abnormal   Collection Time: 05/03/18 11:42 AM  Result Value Ref Range   Glucose-Capillary 125 (H) 70 - 99 mg/dL  POCT i-Stat troponin  I     Status: None   Collection Time: 05/03/18  2:50 PM  Result Value Ref Range   Troponin i, poc 0.02 0.00 - 0.08 ng/mL   Comment 3            Comment: Due to the release kinetics of cTnI, a negative result within the first hours of the onset of symptoms does not rule out myocardial infarction with certainty. If myocardial infarction is still suspected, repeat the test at appropriate intervals.   CG4 I-STAT (Lactic acid)     Status: None   Collection Time: 05/03/18  2:52 PM  Result Value Ref Range   Lactic Acid, Venous 1.72 0.5 - 1.9 mmol/L  CG4 I-STAT (Lactic acid)     Status: None   Collection Time: 05/03/18  2:53 PM  Result Value Ref Range   Lactic Acid, Venous 1.75 0.5 - 1.9 mmol/L  I-STAT, chem 8     Status: Abnormal   Collection Time: 05/03/18  2:56 PM  Result Value Ref Range   Sodium 141 135 - 145 mmol/L   Potassium 4.4 3.5 - 5.1 mmol/L   Chloride 112 (H) 98 - 111 mmol/L   BUN 38 (H)  6 - 20 mg/dL   Creatinine, Ser 3.60 (H) 0.44 - 1.00 mg/dL   Glucose, Bld 262 (H) 70 - 99 mg/dL   Calcium, Ion 1.30 1.15 - 1.40 mmol/L   TCO2 23 22 - 32 mmol/L   Hemoglobin 9.2 (L) 12.0 - 15.0 g/dL   HCT 27.0 (L) 36.0 - 46.0 %  Comprehensive metabolic panel     Status: Abnormal   Collection Time: 05/03/18  4:08 PM  Result Value Ref Range   Sodium 140 135 - 145 mmol/L   Potassium 5.5 (H) 3.5 - 5.1 mmol/L    Comment: DELTA CHECK NOTED NO VISIBLE HEMOLYSIS    Chloride 108 98 - 111 mmol/L   CO2 26 22 - 32 mmol/L   Glucose, Bld 228 (H) 70 - 99 mg/dL   BUN 42 (H) 6 - 20 mg/dL   Creatinine, Ser 4.67 (H) 0.44 - 1.00 mg/dL   Calcium 8.5 (L) 8.9 - 10.3 mg/dL   Total Protein 6.5 6.5 - 8.1 g/dL   Albumin 2.6 (L) 3.5 - 5.0 g/dL   AST 110 (H) 15 - 41 U/L   ALT 82 (H) 0 - 44 U/L   Alkaline Phosphatase 224 (H) 38 - 126 U/L   Total Bilirubin 0.6 0.3 - 1.2 mg/dL   GFR calc non Af Amer 10 (L) >60 mL/min   GFR calc Af Amer 12 (L) >60 mL/min   Anion gap 6 5 - 15    Comment: Performed at [facility], [place] [address] [place], [place] [address]  Lactic acid, plasma     Status: None   Collection Time: 05/03/18  4:08 PM  Result Value Ref Range   Lactic Acid, Venous 1.2 0.5 - 1.9 mmol/L    Comment: Performed at [facility], [place] [address], [place], [place] [address]  CBC     Status: Abnormal   Collection Time: 05/03/18  4:08 PM  Result Value Ref Range   WBC 10.1 4.0 - 10.5 K/uL   RBC 3.10 (L) 3.87 - 5.11 MIL/uL   Hemoglobin 9.1 (L) 12.0 - 15.0 g/dL   HCT 32.5 (L) 36.0 - 46.0 %   MCV 104.8 (H) 80.0 - 100.0 fL   MCH 29.4 26.0 - 34.0 pg   MCHC 28.0 (L) 30.0 - 36.0 g/dL   RDW 12.5 11.5 - 15.5 %   Platelets 115 (L) 150 - 400 K/uL    Comment: REPEATED TO VERIFY PLATELET COUNT CONFIRMED BY SMEAR SPECIMEN CHECKED FOR CLOTS Immature Platelet Fraction may be clinically indicated, consider ordering this additional test [id number]    nRBC 0.0 0.0 - 0.2 %    Comment: Performed at [facility], [place] [address], [place], [place] [address]  Procalcitonin - Baseline     Status: None   Collection Time: 05/03/18  4:08 PM  Result Value Ref Range   Procalcitonin 0.42 ng/mL    Comment:        Interpretation: PCT (Procalcitonin) <= 0.5 ng/mL: Systemic infection (sepsis) is not likely. Local bacterial infection is possible. (NOTE)       Sepsis PCT Algorithm           Lower Respiratory Tract                                      Infection PCT Algorithm    ----------------------------     ----------------------------  PCT < 0.25 ng/mL                PCT < 0.10 ng/mL         Strongly encourage             Strongly discourage   discontinuation of antibiotics    initiation of antibiotics    ----------------------------     -----------------------------       PCT 0.25 - 0.50 ng/mL            PCT 0.10 - 0.25 ng/mL               OR       >80% decrease in PCT            Discourage initiation of                                             antibiotics      Encourage discontinuation           of antibiotics    ----------------------------     -----------------------------         PCT >= 0.50 ng/mL              PCT 0.26 - 0.50 ng/mL               AND        <80% decrease in PCT             Encourage initiation of                                             antibiotics       Encourage continuation           of antibiotics    ----------------------------     -----------------------------        PCT >= 0.50 ng/mL                  PCT > 0.50 ng/mL               AND         increase in PCT                  Strongly encourage                                      initiation of antibiotics    Strongly encourage escalation           of antibiotics                                     -----------------------------                                           PCT <= 0.25 ng/mL  OR                                        > 80% decrease in PCT                                     Discontinue / Do not initiate                                             antibiotics Performed at [place] [address], [place], [place] [address]     Ct Abdomen Pelvis Wo Contrast  Result Date: 05/03/2018 CLINICAL DATA:  Renal mass. EXAM: CT ABDOMEN AND PELVIS WITHOUT CONTRAST TECHNIQUE: Multidetector CT imaging of the abdomen and pelvis was performed following the standard protocol without IV contrast. COMPARISON:  Ultrasound of January 31, 2018.  CT scan of Sep 06, 2017. FINDINGS: Lower chest: There is now noted opacification of the left lung base consistent with large pneumonia. Right middle lobe opacity is also noted concerning for pneumonia. Hepatobiliary: Cholelithiasis is noted. Nodular hepatic contours are noted consistent with cirrhosis. No biliary dilatation is noted. Pancreas: Unremarkable. No pancreatic ductal dilatation or surrounding inflammatory changes. Spleen: Mild  splenomegaly is noted. Adrenals/Urinary Tract: Adrenal glands appear normal. Stable 3.1 cm low density is noted in midpole of left kidney consistent with cystic abnormality seen on prior ultrasound. No hydronephrosis or renal obstruction is noted. No renal or ureteral calculi are noted. Urinary bladder is unremarkable. Stomach/Bowel: Stomach is within normal limits. Appendix appears normal. No evidence of bowel wall thickening, distention, or inflammatory changes. Sigmoid diverticulosis is noted. Vascular/Lymphatic: Aortic atherosclerosis. No enlarged abdominal or pelvic lymph nodes. Reproductive: Uterus and bilateral adnexa are unremarkable. Other: No abdominal wall hernia or abnormality. No abdominopelvic ascites. Musculoskeletal: No acute or significant osseous findings. IMPRESSION: Large left lower lobe pneumonia is noted, with probable right middle lobe pneumonia as well. 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 Stable 3.1 cm rounded low density is seen in midpole of left kidney corresponding with predominantly cystic abnormality seen on prior ultrasound and CT scan. If patient is unable to undergo MRI, continued follow-up with ultrasound in 6 months is recommended. Aortic Atherosclerosis (ICD10-I70.0). Electronically Signed   By: Marijo Conception, M.D.   On: 05/03/2018 11:52    ROS chart review only since patient has tracheostomy and is on the ventilator. Blood pressure 115/60, pulse 86, temperature (!) 92.1 F (33.4 C), resp. rate (!) 25, height 5\' 1"  (1.549 m), weight 100.8 kg, SpO2 93 %. Physical Exam morbidly obese.  Intraosseous line removed left tibia using vice grips.  Patient has rockhard posterior compartment hard anterior and lateral compartment consistent with left lower extremity compartment syndrome and no distal palpable pulses and no dopplerable pulses at the ankle.  Popliteal pulse not obtainable and there is an obvious temperature change mid to distal left  thigh.  Assessment/Plan: Dysvascular limb left lower extremity post code with low flow state.  Vascular surgery was called on emergent basis for evaluation, arteriogram as needed etc.  Patient does have compartment syndrome but needs vascular consultation likely with arterial thrombus during low flow state.  My cell [phone].  Marybelle Killings  05/03/2018, 5:49 PM

## 2018-05-03 NOTE — Progress Notes (Addendum)
PCCM:  I was paged to the floor by the hospitalist service during time of cardiac arrest.  At presentation the cardiac arrest had been under going for approximately 20 minutes.  I ran the remainder of the code for the next 20 to 25 minutes.  Emergency room staff placed left tibial intraosseous line due to the inability to obtain adequate peripheral IV access after several rounds of epinephrine during the final pulse check we were able to auscultate pulse via carotid Doppler.  Ultimately were able to auscultate pulse with Doppler in the radial.  After the return of spontaneous circulation patient was placed on a norepinephrine infusion and transferred to the intensive care unit.  At the time she still had her cuffless for trach in.  We were only able to oxygenate her the closing a bag-valve-mask over the face and occluding it to stop the escape of air during bagging through the cuffless trach.  On presentation to the ICU her sats were in the mid 60s and we were able to get them up into the low 80s with bagging.  Were unable to connected to the ventilator due to the persistent leak around the cuffless for tracheostomy tube.  The decision was made to pursue urgent exchange of the tracheostomy tube over bougie with all backup equipment for necessary endotracheal intubation.  We attempted exchange of the tracheostomy over bougie and a cuffed 4 and cuffed 6 trach were unsuccessful.  At that time we induced rapid sequence intubation with etomidate and rocuronium to facilitate an endotracheal attempt at intubation.  I was only able to view the epiglottis and occasionally had a glimpse at the base of the vocal cords.  We were able to pass the endotracheal tube underneath the epiglottis and into the region of the vocal cords however or an successful at advancing the tube over the stylette into the airway.  Decision at that time was to abort and continue to bag the patient through the replaced for cuffless  tracheostomy tube in her neck stoma.  I use the bronchoscope to confirm location of the cuffless for replacement back into the trachea.   Anesthesia was called to the bedside for their assistance and the decision was made for them to reattempt at endotracheal intubation however they were also unsuccessful at placing the ET tube.  ENT was called to the patient's bedside they were able to secure an airway with a cuff using endotracheal tube through the patient's ostomy.  After all of this was completed the left leg was examined with a very tense left calf compartment.  With Doppler we were unable to obtain a distal DP pulse or popliteal pulse.  Urgent consultation to orthopedic surgery and vascular surgery was placed.  All vasopressors and fluids were stopped through the entry is osseous line.  We were unable to manually pull the intraosseous access from the left tibia without difficulty.  Once orthopedics arrived they were able to use a pair of pliers to extract the intraosseous needle.  We attempted right femoral CVC placement but were unable to pass catheter easily and the procedure was abort. Radial arterial line was placed for BP monitoring   We started IV heparin due to the loss of left lower extremity pulse.  And concern for ischemia.  I have spoke with Dr. Oneida Alar from vascular surgery who would like the patient transferred to [facility] for further vascular evaluation.  Pulmonary critical care appreciates to the multiple specialties involved in the care of  this patient following cardiac arrest.  Garner Nash, DO [facility] 05/03/2018 7:09 PM  Personal pager: [phone] If unanswered, please page CCM On-call: [phone]

## 2018-05-03 NOTE — Consult Note (Signed)
Called to see patient on an emergent basis due to left lower leg compartment syndrome with hard posterior compartment lateral compartment after patient was coded extensively for greater than 35 minutes  due to loss of airway and an intraosseous line was placed into the left proximal tibia during the code due to lack of other access.  Pulmonary service called me since she had no palpable pulses in the foot of the left leg. Patient currently trached, on a ventilator.  Patient has obvious temperature change mid to distal thigh with absent popliteal pulse and no dopplerable distal pulses.  Pulse ox shows flatline with applied to toe and vascular surgery has been called for evaluation for a pulseless leg.  By clinical exam she has obvious compartment syndrome of the left leg.  Vascular surgery to see and can perform compartment releases as needed if her leg can be revascularized.  I am happy to help if needed.  My cell phone [phone].

## 2018-05-03 NOTE — Progress Notes (Signed)
Pharmacy Antibiotic Note  Melody Gray is a 48 y.o. female admitted on 04/30/2018 with cough, SOB, extensive PMH including sarcoidosis with pulmonary and hepatic involvement, and chronic respiratory failure with tracheostomy.  She was found to have pneumonia on CT on 1/3, also had Code blue with CPR and likely aspiration.  Pharmacy has been consulted for Cefepime dosing.  SCr 4.5 this morning, 3.6 now with CrCl ~ 20 ml/min  Plan: Cefepime 1g IV q12h. Follow up renal fxn, culture results, and clinical course.   Height: 5\' 1"  (154.9 cm) Weight: 222 lb 2.9 oz (100.8 kg) IBW/kg (Calculated) : 47.8  Temp (24hrs), Avg:99.4 F (37.4 C), Min:99.1 F (37.3 C), Max:99.9 F (37.7 C)  Recent Labs  Lab 04/30/18 1902 04/30/18 2116 05/01/18 0538 05/02/18 0815 05/03/18 0814 05/03/18 1452 05/03/18 1453 05/03/18 1456  WBC 4.0  --  4.7 9.1  --   --   --   --   CREATININE 3.23* 3.40* 3.22* 3.22* 4.52*  --   --  3.60*  LATICACIDVEN  --   --   --   --   --  1.72 1.75  --     Estimated Creatinine Clearance: 20.6 mL/min (A) (by C-G formula based on SCr of 3.6 mg/dL (H)).    Allergies  Allergen Reactions  . Vicodin [Hydrocodone-Acetaminophen] Itching    Antimicrobials this admission:  1/3 Cefepime >>  Dose adjustments this admission:   Microbiology results:  12/31 MRSA PCR: negative  Thank you for allowing pharmacy to be a part of this patient's care.  Leeroy Bock 05/03/2018 3:52 PM

## 2018-05-03 NOTE — Op Note (Signed)
Procedure: 4 compartment fasciotomy left leg  Preoperative diagnosis: Compartment syndrome left leg  Postoperative diagnosis: Same  Anesthesia: General  Assistant: Nurse  Operative findings: #1 viable muscle tissue posterior deep superficial anterior lateral compartments but severe edema  Operative details: After team informed consent, patient taken the operating.  The patient placed supine position operating table.  After induction of general anesthesia patient's left leg was prepped and draped in usual sterile fashion.  Next a longitudinal incision was made on the left lateral aspect of the leg carried down through subtenons tissues down the level of fascia.  The fascia for the anterior compartment was incised from just below the knee down to just above the ankle.  The anterior compartments bulge significantly.  A similar incision was then made in the lateral compartment.  There was also similar muscle bulge.  The muscle was pink viable and contractile.  The anterior compartment was slightly dusky upon initially opening the compartment but this had fairly good pink color by the end of the case.  Attention was then turned to the medial aspect of the left leg.  Similarly a longitudinal incision was made on the leg carried on through subtenons tissues down the level of the fascia.  The posterior superficial compartment was decompressed from just below the knee to just above the ankle.  I then decompressed the posterior deep compartment through the popliteal space and took down several of the soleus fibers to make sure this was fully decompressed as well.  There was more edematous fluid on this side of the leg but again the muscle was pink viable and contractile.  Normal saline wet-to-dry dressing was applied as well as an Ace wrap.  Patient was taken back to the intensive care unit in stable condition.  Instrument sponge needle counts correct the end of the case.  Ruta Hinds, MD [facility] Office: [phone] Pager: [phone]

## 2018-05-03 NOTE — Consult Note (Signed)
Melody Gray Admit Date: 04/30/2018 05/03/2018 Melody Gray Requesting Physician:  Melody Au Gray  Reason for Consult:  AoCKD, Hyperkalemia HPI:  48 year old female with multiple medical problems admitted 12/31 with dyspnea, cough and found to have a pneumonia.  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  She has had hyperkalemia as high as 7.0 with medical management, most recently 6.1.  Urine analysis on arrival with 4+ proteinuria, no hematuria, no pyuria.  She has been worked up for a complex renal lesion thought to be a cyst.  Earlier today the patient had a prolonged cardiac arrest and after more than 30 minutes had return of circulation.  She has significant issues with her airway at the current time.  She remains critically ill.  CT today without hydronephrosis or obstruction.  No contrast exposure.  No nephrotoxin exposure.  Initial post code K is 4.4.    Creat (mg/dL)  Date Value  01/06/2016 1.90 (H)  09/25/2014 1.20 (H)  09/11/2014 1.13 (H)  08/19/2014 1.34 (H)  08/07/2014 2.69 (H)  01/02/2014 1.26 (H)  09/12/2013 0.95  08/04/2013 1.10  07/25/2013 0.83  05/20/2013 0.84   Creatinine, Ser (mg/dL)  Date Value  05/03/2018 3.60 (H)  05/03/2018 4.52 (H)  05/02/2018 3.22 (H)  05/01/2018 3.22 (H)  04/30/2018 3.40 (H)  04/30/2018 3.23 (H)  02/04/2018 2.69 (H)  01/22/2018 2.33 (H)  01/17/2018 2.39 (H)  06/07/2017 1.72 (H)  ] I/Os: I/O last 3 completed shifts: In: 240 [P.O.:240] Out: 500 [Urine:500] \  ROS NSAIDS: none IV Contrast no exposure TMP/SMX none Balance of 12 systems is negative w/ exceptions as above  PMH  Past Medical History:  Diagnosis Date  . Abnormal LFTs (liver function tests)    Liver  U/S and exam c/w HSM. Hep B serology neg. but Hep C ab +, HIV neg. 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 Acute bronchitis 07/29/2014  . Anemia   . Cardiomyopathy, nonischemic ([place])    a. Varying EF over the years - initially 35% in 09/2010. Normal cors 12/2010. b. Echo 07/2014: EF 55-60%, no RWMA, + diastolic flattening and systolic flattening c/w RV volume and pressure overload, mild LAE/RAE, mod dilated RV, mod TR, PASP severely increased at 38mmHg. c. RHC 07/2014: moderate pulmonary HTN likely WHO group 3 with markedly elevated CVP and relatively normal left sided pressures.  . Chronic diastolic CHF (congestive heart failure) ([place])   . Chronic respiratory failure ([place])    a. became O2 dependent in July 2011. b. She required trache placement in 07/2013 and has been followed by pulmonology as well.  . CKD (chronic kidney disease), stage II   . Complication of anesthesia    " difficult waking "  . Diabetes mellitus    insulin dependent  . Diabetic retinopathy    Right eye 2/11  . Essential hypertension   . Health maintenance examination    Mammogram 05/2010 Negative; Last Pap smear 03/2008; Last DM eye exam 2/11> mild non-proliferative diabetic retinopathy. OD  . Helicobacter pylori ab+ 05/2011   Pt was symptomatic and treatment planned for 05/2011  . Hx of cardiac cath 2/08   No CAD, no RAS,  normal EF  . Hypokalemia   . Hypomagnesemia   .  Hypoxemia    CT angiogram 9/11>> No PE; PFTs 10/11- FEV1 1.20 (49%) with 16% better p B2, DLCO 33%> corrects to 84; O2 sats ok on 4 lpm X rapid walk X 3 laps 05/2010  . Long term current use of systemic steroids   . Morbid obesity ([place])    Target wt= 153 for BMI <30  . Obesity hypoventilation syndrome ([place])   . Pulmonary hypertension ([place])   . QT prolongation   . Sarcoidosis    Followed by Dr. Melvyn Novas; w/ liver involvement per biopsy 12/09, Reversible airway component so started on [facility] 01/2010; HFA 75% p  coaching 05/2010  . Seborrheic dermatitis of scalp   . Sleep apnea   . Tobacco abuse    PSH  Past Surgical History:  Procedure Laterality Date  . BREAST SURGERY    . CESAREAN SECTION    . RIGHT HEART CATHETERIZATION N/A 08/12/2014   Procedure: RIGHT HEART CATH;  Surgeon: Melody Artist, Gray;  Location: [facility] CATH LAB;  Service: Cardiovascular;  Laterality: N/A;  . TRACHEOSTOMY TUBE PLACEMENT N/A 08/10/2013   Procedure: TRACHEOSTOMY;  Surgeon: Melody Montane, Gray;  Location: [facility] OR;  Service: ENT;  Laterality: N/A;  . TUBAL LIGATION     FH  Family History  Problem Relation Age of Onset  . Cancer Mother        colon cancer  . Multiple sclerosis Father   . Diabetes Father   . Asthma Sister        in childhood  . Hypertension Other    [place]  reports that she has been smoking cigarettes. She started smoking about 3 years ago. She has a 6.60 pack-year smoking history. She has never used smokeless tobacco. She reports that she does not drink alcohol or use drugs. Allergies  Allergies  Allergen Reactions  . Vicodin [Hydrocodone-Acetaminophen] Itching   Home medications Prior to Admission medications   Medication Sig Start Date End Date Taking? Authorizing Provider  amLODipine (NORVASC) 10 MG tablet Take 1 tablet (10 mg total) by mouth daily. 01/22/18 01/22/19 Yes Melody Grieve, Gray  carvedilol (COREG) 6.25 MG tablet Take 1 tablet (6.25 mg total) by mouth 2 (two) times daily. 07/23/17  Yes Melody Nimrod, Gray  glipiZIDE (GLUCOTROL) 5 MG tablet Take 0.5 tablets (2.5 mg total) by mouth daily before breakfast. 04/16/18  Yes Melody Seat, Gray  insulin detemir (LEVEMIR) 100 UNIT/ML injection ADMINISTER 50 UNITS UNDER THE SKIN AT BEDTIME 04/16/18  Yes Melody Seat, Gray  liraglutide (VICTOZA) 18 MG/3ML SOPN Inject 0.3 mLs (1.8 mg total) into the skin daily. 05/07/17  Yes Melody Grieve, Gray  Pseudoephedrine-DM-GG (ROBITUSSIN COLD & COUGH PO) Take 15 mLs by mouth daily as needed (cough).   Yes  Provider, Historical, Gray  ranitidine (ZANTAC) 150 MG tablet Take 1 tablet (150 mg total) by mouth 2 (two) times daily. 04/11/18 04/11/19 Yes [name], NP  atorvastatin (LIPITOR) 40 MG tablet Take 1 tablet (40 mg total) by mouth daily. Patient not taking: Reported on 04/30/2018 01/17/18   Melody Seat, Gray  BD INSULIN SYRINGE U/F 30G X 1/2" 0.5 ML MISC USE AS DIRECTED 04/16/18   Melody Seat, Gray  glucose blood test strip Use to check blood sugar 3 times daily. Dx code E11.65. Insulin dependent 04/17/18   Melody Seat, Gray  Insulin Pen Needle (B-D UF III MINI PEN NEEDLES) 31G X 5 MM MISC USE TWICE A DAY 11/22/17   Melody Seat, Gray    Current Medications Scheduled Meds: .  amLODipine  10 mg Oral Daily  . carvedilol  6.25 mg Oral BID  . chlorhexidine  15 mL Mouth Rinse BID  . famotidine  20 mg Oral Daily  . fluticasone  1 spray Each Nare Daily  . heparin  5,000 Units Subcutaneous Q8H  . insulin aspart  0-5 Units Subcutaneous QHS  . insulin aspart  0-9 Units Subcutaneous TID WC  . insulin detemir  20 Units Subcutaneous QHS  . levofloxacin  500 mg Oral Q48H  . mouth rinse  15 mL Mouth Rinse q12n4p  . sodium bicarbonate      . sodium chloride flush  3 mL Intravenous Q12H  . sodium chloride flush  3 mL Intravenous Q12H  . sodium zirconium cyclosilicate  10 g Oral Once   Continuous Infusions: . sodium chloride     PRN Meds:.sodium chloride, acetaminophen **OR** acetaminophen, albuterol, ondansetron **OR** ondansetron (ZOFRAN) IV, sodium chloride flush  CBC Recent Labs  Lab 04/30/18 1902  05/01/18 0538 05/02/18 0815 05/03/18 1456  WBC 4.0  --  4.7 9.1  --   NEUTROABS 1.8  --   --  7.0  --   HGB 10.8*   < > 10.3* 9.9* 9.2*  HCT 37.3   < > 36.2 35.2* 27.0*  MCV 102.2*  --  103.4* 104.1*  --   PLT 162  --  136* 148*  --    < > = values in this interval not displayed.   Basic Metabolic Panel Recent Labs  Lab 04/30/18 1902 04/30/18 2116  05/01/18 0538  05/01/18 0913 05/01/18 1942 05/02/18 0815 05/02/18 1944 05/03/18 0814 05/03/18 1456  NA 140 139  --  141  --   --  139  --  135 141  K 6.9* 6.6*   < > 6.6* 7.0* 5.9* 5.3* 6.2* 6.1* 4.4  CL 109 109  --  111  --   --  109  --  103 112*  CO2 26  --   --  24  --   --  25  --  23  --   GLUCOSE 143* 102*  --  202*  --   --  163*  --  152* 262*  BUN 37* 35*  --  38*  --   --  34*  --  43* 38*  CREATININE 3.23* 3.40*  --  3.22*  --   --  3.22*  --  4.52* 3.60*  CALCIUM 8.6*  --   --  8.7*  --   --  8.1*  --  8.2*  --   PHOS  --   --   --   --   --   --   --   --  6.2*  --    < > = values in this interval not displayed.    Physical Exam  Blood pressure (!) 116/56, pulse 94, temperature 99.1 F (37.3 C), temperature source Oral, resp. rate 17, height 5\' 1"  (1.549 m), weight 100.8 kg, SpO2 (!) 89 %. GEN: post code EYES: eyes closed CV: regular, nl s1s2 PULM: Coarse BS b/l ABD: obese SKIN: IO catheter, dry skin EXT: Trace LEE   Assessment 10F admitted with AoCKD3-4 and hyperkalemia now s/p prolonged cardiac arrest 1/3  1. AOCKD3-4 2. Hyperkalemia, pre Code K 6.1; first post code 4.4 3. Prolonged cardiac arrest 05/03/17 4. Difficult airway was with cuffless trach 5. Shock 6. HCAP 7. Proteinuria  Plan 1. No immediate needs for HD, need to let the  dust settle a little ere 2. Will follow labs closely 3. High risk for svere anoxic injury 4. Can w/u proteinuria based on how she does here   Pearson Grippe Gray [phone] pgr 05/03/2018, 4:18 PM

## 2018-05-03 NOTE — Progress Notes (Addendum)
VAST RN called to code blue. Upon arrival pt had 2 patent PIV's in left upper extremity; RN utilizing one access for code meds. Advised CPR recorder VAST RN leaving, but if needed again at later time to overhead page VAST RN.

## 2018-05-03 NOTE — ED Provider Notes (Signed)
[place]  Department of Emergency Medicine   Code Blue CONSULT NOTE  Chief Complaint: Cardiac arrest/unresponsive   Level V Caveat: Unresponsive  History of present illness: I was contacted by the hospital for a CODE BLUE cardiac arrest upstairs and presented to the patient's bedside.  Patient was reportedly going to the bathroom when she collapsed.  Initial rhythm PEA.  Arrived about 5 minutes into the code.  She was receiving bag valve mask oxygen at 100% through her trach tube.  She was receiving cardiac compressions.  When I arrived she was in PEA after receiving a single dose of epinephrine.  ROS: Unable to obtain, Level V caveat  Scheduled Meds: . amLODipine  10 mg Oral Daily  . carvedilol  6.25 mg Oral BID  . chlorhexidine  15 mL Mouth Rinse BID  . famotidine  20 mg Oral Daily  . fentaNYL (SUBLIMAZE) injection  50 mcg Intravenous Once  . fluticasone  1 spray Each Nare Daily  . heparin  5,000 Units Subcutaneous Q8H  . insulin aspart  0-5 Units Subcutaneous QHS  . insulin aspart  0-9 Units Subcutaneous TID WC  . insulin detemir  20 Units Subcutaneous QHS  . mouth rinse  15 mL Mouth Rinse q12n4p  . sodium bicarbonate      . sodium chloride flush  3 mL Intravenous Q12H  . sodium chloride flush  3 mL Intravenous Q12H  . sodium zirconium cyclosilicate  10 g Oral Once   Continuous Infusions: . sodium chloride    . ceFEPime (MAXIPIME) IV    . fentaNYL infusion INTRAVENOUS    . norepinephrine (LEVOPHED) Adult infusion     PRN Meds:.sodium chloride, acetaminophen **OR** acetaminophen, albuterol, docusate, fentaNYL, ondansetron **OR** ondansetron (ZOFRAN) IV, sodium chloride flush Past Medical History:  Diagnosis Date  . Abnormal LFTs (liver function tests)    Liver U/S and exam c/w HSM. Hep B serology neg. but Hep C ab +, HIV neg. 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 Acute bronchitis 07/29/2014  . Anemia    . Cardiomyopathy, nonischemic ([place])    a. Varying EF over the years - initially 35% in 09/2010. Normal cors 12/2010. b. Echo 07/2014: EF 55-60%, no RWMA, + diastolic flattening and systolic flattening c/w RV volume and pressure overload, mild LAE/RAE, mod dilated RV, mod TR, PASP severely increased at 36mmHg. c. RHC 07/2014: moderate pulmonary HTN likely WHO group 3 with markedly elevated CVP and relatively normal left sided pressures.  . Chronic diastolic CHF (congestive heart failure) ([place])   . Chronic respiratory failure ([place])    a. became O2 dependent in July 2011. b. She required trache placement in 07/2013 and has been followed by pulmonology as well.  . CKD (chronic kidney disease), stage II   . Complication of anesthesia    " difficult waking "  . Diabetes mellitus    insulin dependent  . Diabetic retinopathy    Right eye 2/11  . Essential hypertension   . Health maintenance examination    Mammogram 05/2010 Negative; Last Pap smear 03/2008; Last DM eye exam 2/11> mild non-proliferative diabetic retinopathy. OD  . Helicobacter pylori ab+ 05/2011   Pt was symptomatic and treatment planned for 05/2011  . Hx of cardiac cath 2/08   No CAD, no RAS,  normal EF  . Hypokalemia   . Hypomagnesemia   . Hypoxemia    CT angiogram 9/11>> No PE; PFTs 10/11-  FEV1 1.20 (49%) with 16% better p B2, DLCO 33%> corrects to 84; O2 sats ok on 4 lpm X rapid walk X 3 laps 05/2010  . Long term current use of systemic steroids   . Morbid obesity ([place])    Target wt= 153 for BMI <30  . Obesity hypoventilation syndrome ([place])   . Pulmonary hypertension ([place])   . QT prolongation   . Sarcoidosis    Followed by Dr. Melvyn Novas; w/ liver involvement per biopsy 12/09, Reversible airway component so started on [facility] 01/2010; HFA 75% p coaching 05/2010  . Seborrheic dermatitis of scalp   . Sleep apnea   . Tobacco abuse    Past Surgical History:  Procedure Laterality Date  . BREAST SURGERY    . CESAREAN SECTION    . RIGHT  HEART CATHETERIZATION N/A 08/12/2014   Procedure: RIGHT HEART CATH;  Surgeon: Jolaine Artist, MD;  Location: [facility] CATH LAB;  Service: Cardiovascular;  Laterality: N/A;  . TRACHEOSTOMY TUBE PLACEMENT N/A 08/10/2013   Procedure: TRACHEOSTOMY;  Surgeon: Melissa Montane, MD;  Location: [place];  Service: ENT;  Laterality: N/A;  . TUBAL LIGATION     Social History   Socioeconomic History  . Marital status: Single    Spouse name: Not on file  . Number of children: 2  . Years of education: Not on file  . Highest education level: Not on file  Occupational History  . Occupation: works on a school bus Garment/textile technologist: UNEMPLOYED  Social Needs  . Financial resource strain: Not on file  . Food insecurity:    Worry: Not on file    Inability: Not on file  . Transportation needs:    Medical: Not on file    Non-medical: Not on file  Tobacco Use  . Smoking status: Current Some Day Smoker    Packs/day: 0.33    Years: 20.00    Pack years: 6.60    Types: Cigarettes    Start date: 10/05/2014  . Smokeless tobacco: Never Used  . Tobacco comment: 1 pack every 3 days  Substance and Sexual Activity  . Alcohol use: No    Alcohol/week: 0.0 standard drinks  . Drug use: No  . Sexual activity: Yes    Partners: Female    Birth control/protection: None  Lifestyle  . Physical activity:    Days per week: Not on file    Minutes per session: Not on file  . Stress: Not on file  Relationships  . Social connections:    Talks on phone: Not on file    Gets together: Not on file    Attends religious service: Not on file    Active member of club or organization: Not on file    Attends meetings of clubs or organizations: Not on file    Relationship status: Not on file  . Intimate partner violence:    Fear of current or ex partner: Not on file    Emotionally abused: Not on file    Physically abused: Not on file    Forced sexual activity: Not on file  Other Topics Concern  . Not on file  Social History  Narrative   Diabetic card given 05/03/2010.   Financial assistance approved for 100% discount at [facility] and has [facility] card. [name] 12/07/2009.      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   Allergies  Allergen Reactions  . Vicodin [Hydrocodone-Acetaminophen] Itching  Last set of Vital Signs (not current) Vitals:   05/03/18 1600 05/03/18 1602  BP: (!) 108/50 (!) 116/56  Pulse: 94 94  Resp: 16 17  Temp:    SpO2: (!) 88% (!) 89%      Physical Exam Obese Gen: unresponsive Cardiovascular: pulseless  Resp: apneic. Breath sounds equal bilaterally with bagging  Abd: nondistended  Neuro: GCS 3, unresponsive to pain  HEENT: Pale, oral mucous membranes dry.  No blood in oropharynx. Neck: Trach tube and trach site, both appear normal. Musculoskeletal: No deformity  Skin: warm    CRITICAL CARE Performed by: Daleen Bo Total critical care time: 25 Critical care time was exclusive of separately billable procedures and treating other patients. Critical care was necessary to treat or prevent imminent or life-threatening deterioration. Critical care was time spent personally by me on the following activities: development of treatment plan with patient and/or surrogate as well as nursing, discussions with consultants, evaluation of patient's response to treatment, examination of patient, obtaining history from patient or surrogate, ordering and performing treatments and interventions, ordering and review of laboratory studies, ordering and review of radiographic studies, pulse oximetry and re-evaluation of patient's condition.  Cardiopulmonary Resuscitation (CPR) Procedure Note  Directed/Performed by: Daleen Bo I personally directed ancillary staff and/or performed CPR in an effort to regain return of spontaneous circulation and to maintain cardiac, neuro and systemic perfusion.  Co-managed resuscitation with critical care after 15 minutes   Medical  Decision making  Cardiopulmonary arrest, with hyperkalemia.  Bagging was partially deficient secondary to non-cuffed tracheostomy tube, requiring placement of a mask over her mouth to prevent air escape.  No evidence for aspiration during the resuscitation.  Assessment and Plan  Cardiopulmonary arrest, with PEA.   Daleen Bo, MD 05/03/18 [phone]

## 2018-05-03 NOTE — Progress Notes (Signed)
VAST RN called back to code blue at 1430. PIV placed as charted on first attempt. Flushed and saline locked.

## 2018-05-03 NOTE — Progress Notes (Signed)
[facility] PROGRESS NOTE  Melody Gray PJA:[id number] DOB: 09/22/1970 DOA: 04/30/2018 PCP: Neva Seat, MD   Narrative: 48 year old female htn Dm ty ii ckd 3-4  Sarcoidosis diagnosed 2001-O2 dependent 10/2009-pulmonary arterial hypertension OHS requiring trach 07/2013 Right heart cath 08/11/2013 showing elevated CVP  Admit for cough increased dyspnea anorexia X 2 weeks on 12/31 Found to have new onset AKI creatinine 3.2 up from 2.7 potassium 6.9-Rx in the emergency room IV Lasix IV fluids insulin dextrose  A & Plan AKI superimposed on CKD 3-4-3-passing reasonable amounts of urine-Fena calculated shows intrinsic renal disease--as a follow-up MRI was attempted because of ultrasound 01/2018 showing mass-CT had to be done instead showing stable cystic mass Kidney function has continued to decline potassium is still high I have spoken to nephrology who will see the patient Patient also has mild hyperkalemia may need to start on binders  ?  Pneumonia CT of the abdomen pelvis shows right middle lobe and left basilar pneumonia-with these findings I feel it is reasonable start patient on Levaquin she is not having much in way of symptoms we will follow  Hyperkalemia-worsened again on 1/3 therefore using Lokelma x1 to see what potassium is tomorrow  Pulmonary hypertension continue Coreg 6.25 twice daily amlodipine 10 daily and will need outpatient therapy and treatment  Cirrhosis?  2/2 sarcoid-needs weight loss and monitoring BMI is 41 which is likely the cause of her fatty liver and cirrhosis  Insulin-dependent DM last A1c 9.512/2019-CBGs 120-150 continue Lantus 20 units and sliding scale coverage-holding glipizide 0.5 but resume Victoza  Chronic respiratory failure requiring trach-follows as an outpatient will need trach care   Aidian Salomon, MD  [facility] Direct contact: [phone] --Via amion app OR  --[email or web address]; password [email or web address]  7PM-7AM contact night  coverage as above 05/03/2018, 1:58 PM  LOS: 2 days   Consultants:  Pulmonary  Procedures:  No  Antimicrobials:  no  Interval history/Subjective:  Awake alert some cough some sore throat no other issues no fever no chills had some sputum yesterday per report Overall about the same  Objective:  Vitals:  Vitals:   05/03/18 1232 05/03/18 1310  BP: (!) 114/58   Pulse: 87   Resp: 20   Temp: 99.1 F (37.3 C)   SpO2: 94% 94%    Exam:  Awake sitting bedside Chest no fremitus no resonance no added sound Abdomen is soft nontender no rebound no guarding Neurologically intact cranial nerves II through XII grossly intact power is 5/5 sensory is intact [name] is in place she phonates well I did not examine the stoma Extraocular movements are intact Sensory is grossly intact   I have personally reviewed the following:  DATA   Labs:  BUN/creatinine stable in the up again to 43/4.5 from 3 range creat  Potassium 6.1  Hemoglobin 9.9  Scheduled Meds: . amLODipine  10 mg Oral Daily  . carvedilol  6.25 mg Oral BID  . chlorhexidine  15 mL Mouth Rinse BID  . famotidine  20 mg Oral Daily  . fluticasone  1 spray Each Nare Daily  . heparin  5,000 Units Subcutaneous Q8H  . insulin aspart  0-5 Units Subcutaneous QHS  . insulin aspart  0-9 Units Subcutaneous TID WC  . insulin detemir  20 Units Subcutaneous QHS  . mouth rinse  15 mL Mouth Rinse q12n4p  . sodium chloride flush  3 mL Intravenous Q12H  . sodium chloride flush  3 mL Intravenous Q12H  . sodium zirconium cyclosilicate  10 g  Oral Once   Continuous Infusions: . sodium chloride      Principal Problem:   Hyperkalemia Active Problems:   Lesion of liver and cirrhosis on imaging   Insulin-requiring or dependent type II diabetes mellitus (HCC)   Hypertension complicating diabetes ([place])   Acute renal failure superimposed on stage 4 chronic kidney disease (HCC)   Chronic respiratory failure with hypoxia (HCC)/ trach  dep   Chronic pulmonary hypertension ([place])   LOS: 2 days

## 2018-05-03 NOTE — Anesthesia Preprocedure Evaluation (Addendum)
Anesthesia Evaluation  Patient identified by MRN, date of birth, ID band Patient unresponsive    Reviewed: reviewed documented beta blocker date and time , Unable to perform ROS - Chart review onlyPreop documentation limited or incomplete due to emergent nature of procedure.  Airway Mallampati: Trach       Dental  (+) Teeth Intact   Pulmonary sleep apnea and Continuous Positive Airway Pressure Ventilation , Current Smoker,     + decreased breath sounds      Cardiovascular hypertension, Pt. on medications and Pt. on home beta blockers  Rhythm:Regular Rate:Normal     Neuro/Psych    GI/Hepatic GERD  Medicated,  Endo/Other  diabetes, Type 2, Insulin Dependent, Oral Hypoglycemic Agents  Renal/GU CRF and Renal InsufficiencyRenal disease     Musculoskeletal   Abdominal (+) + obese,   Peds  Hematology   Anesthesia Other Findings   Reproductive/Obstetrics                            Anesthesia Physical Anesthesia Plan  ASA: IV and emergent  Anesthesia Plan: General   Post-op Pain Management:    Induction: Intravenous  PONV Risk Score and Plan: 2 and Treatment may vary due to age or medical condition, Midazolam, Ondansetron and Propofol infusion  Airway Management Planned: Oral ETT  Additional Equipment: Arterial line  Intra-op Plan:   Post-operative Plan: Post-operative intubation/ventilation  Informed Consent: I have reviewed the patients History and Physical, chart, labs and discussed the procedure including the risks, benefits and alternatives for the proposed anesthesia with the patient or authorized representative who has indicated his/her understanding and acceptance.   Only emergency history available and History available from chart only  Plan Discussed with: CRNA  Anesthesia Plan Comments:       Anesthesia Quick Evaluation

## 2018-05-03 NOTE — Transfer of Care (Signed)
Immediate Anesthesia Transfer of Care Note  Patient: Melody Gray  Procedure(s) Performed: FOUR COMPARTMENT FASCIOTOMY, LEFT LOWER LEG (Left )  Patient Location: PACU  Anesthesia Type:General  Level of Consciousness: sedated  Airway & Oxygen Therapy: Patient remains intubated per anesthesia plan and Patient placed on Ventilator (see vital sign flow sheet for setting)  Post-op Assessment: Report given to RN and Post -op Vital signs reviewed and stable  Post vital signs: Reviewed and stable  Last Vitals:  Vitals Value Taken Time  BP    Temp    Pulse 75 05/03/2018 10:19 PM  Resp 28 05/03/2018 10:19 PM  SpO2 98 % 05/03/2018 10:19 PM  Vitals shown include unvalidated device data.  Last Pain:  Vitals:   05/03/18 1232  TempSrc: Oral  PainSc:       Patients Stated Pain Goal: 3 (44/97/53 0051)  Complications: No apparent anesthesia complications

## 2018-05-03 NOTE — Progress Notes (Signed)
Discussed pt with Dr Valeta Harms from Pulmonary approximately 6 pm and requested transfer to [facility] for possible vascular procedure.  Dr Valeta Harms and I both spoke with carelink around 630 pm and said any ICU bed at [facility] is acceptable.   Still awaiting transfer of patient currently  Melody Hinds, MD [facility] [place] Office: [phone] Pager: [phone]

## 2018-05-03 NOTE — Progress Notes (Signed)
ANTICOAGULATION CONSULT NOTE - Initial Consult  Pharmacy Consult for Heparin Indication: ischemic leg  Allergies  Allergen Reactions  . Vicodin [Hydrocodone-Acetaminophen] Itching    Patient Measurements: Height: 5\' 1"  (154.9 cm) Weight: 222 lb 2.9 oz (100.8 kg) IBW/kg (Calculated) : 47.8 Heparin Dosing Weight: 71.4 kg  Vital Signs: Temp: 92.1 F (33.4 C) (01/03 1720) Temp Source: Oral (01/03 1232) BP: 115/60 (01/03 1720) Pulse Rate: 86 (01/03 1720)  Labs: Recent Labs    05/01/18 0538 05/02/18 0815 05/03/18 0814 05/03/18 1456 05/03/18 1608  HGB 10.3* 9.9*  --  9.2* 9.1*  HCT 36.2 35.2*  --  27.0* 32.5*  PLT 136* 148*  --   --  115*  CREATININE 3.22* 3.22* 4.52* 3.60* 4.67*    Estimated Creatinine Clearance: 15.9 mL/min (A) (by C-G formula based on SCr of 4.67 mg/dL (H)).   Medical History: Past Medical History:  Diagnosis Date  . Abnormal LFTs (liver function tests)    Liver U/S and exam c/w HSM. Hep B serology neg. but Hep C ab +, HIV neg. 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 Acute bronchitis 07/29/2014  . Anemia   . Cardiomyopathy, nonischemic ([place])    a. Varying EF over the years - initially 35% in 09/2010. Normal cors 12/2010. b. Echo 07/2014: EF 55-60%, no RWMA, + diastolic flattening and systolic flattening c/w RV volume and pressure overload, mild LAE/RAE, mod dilated RV, mod TR, PASP severely increased at 62mmHg. c. RHC 07/2014: moderate pulmonary HTN likely WHO group 3 with markedly elevated CVP and relatively normal left sided pressures.  . Chronic diastolic CHF (congestive heart failure) ([place])   . Chronic respiratory failure ([place])    a. became O2 dependent in July 2011. b. She required trache placement in 07/2013 and has been followed by pulmonology as well.  . CKD (chronic kidney disease), stage II   . Complication of anesthesia    " difficult waking "  . Diabetes mellitus    insulin dependent  .  Diabetic retinopathy    Right eye 2/11  . Essential hypertension   . Health maintenance examination    Mammogram 05/2010 Negative; Last Pap smear 03/2008; Last DM eye exam 2/11> mild non-proliferative diabetic retinopathy. OD  . Helicobacter pylori ab+ 05/2011   Pt was symptomatic and treatment planned for 05/2011  . Hx of cardiac cath 2/08   No CAD, no RAS,  normal EF  . Hypokalemia   . Hypomagnesemia   . Hypoxemia    CT angiogram 9/11>> No PE; PFTs 10/11- FEV1 1.20 (49%) with 16% better p B2, DLCO 33%> corrects to 84; O2 sats ok on 4 lpm X rapid walk X 3 laps 05/2010  . Long term current use of systemic steroids   . Morbid obesity ([place])    Target wt= 153 for BMI <30  . Obesity hypoventilation syndrome ([place])   . Pulmonary hypertension ([place])   . QT prolongation   . Sarcoidosis    Followed by Dr. Melvyn Novas; w/ liver involvement per biopsy 12/09, Reversible airway component so started on [facility] 01/2010; HFA 75% p coaching 05/2010  . Seborrheic dermatitis of scalp   . Sleep apnea   . Tobacco abuse     Medications:  Scheduled:  . amLODipine  10 mg Oral Daily  . carvedilol  6.25 mg Oral BID  . chlorhexidine  15 mL Mouth Rinse BID  . famotidine  20 mg Oral Daily  .  fentaNYL (SUBLIMAZE) injection  50 mcg Intravenous Once  . fluticasone  1 spray Each Nare Daily  . insulin aspart  0-5 Units Subcutaneous QHS  . insulin aspart  0-9 Units Subcutaneous TID WC  . insulin detemir  20 Units Subcutaneous QHS  . mouth rinse  15 mL Mouth Rinse q12n4p  . midazolam      . sodium bicarbonate      . sodium chloride flush  3 mL Intravenous Q12H  . sodium chloride flush  3 mL Intravenous Q12H  . sodium zirconium cyclosilicate  10 g Oral Once   Infusions:  . sodium chloride    . ceFEPime (MAXIPIME) IV    . fentaNYL infusion INTRAVENOUS    . norepinephrine (LEVOPHED) Adult infusion      Assessment: 28 yoF found to have ischemic leg, LLL compartment syndrome.  She is s/p IO line placed in left proximal  tibia during code blue earlier today.  Today, 05/03/2018:  Baseline coags pending  CBC: Hgb 9.1 (baseline Hgb ~10.3), Plt 115  SCr 4.67, CrCl ~ 16 ml/min  Bleeding:  RN reports that patient is having bleeding from around the trach site from the exchanges earlier today.  She estimates about 241mL blood loss.  RN and MD to carefully monitor for further bleeding.    Goal of Therapy:  Heparin level 0.3-0.7 units/ml Monitor platelets by anticoagulation protocol: Yes   Plan:   D/C heparin SQ orders  Give heparin 2200 units bolus IV x 1 per [place] nomogram  Start heparin IV infusion at 1350 units/hr per [place] nomogram  Heparin level 8 hours after starting  Follow up vascular consult.   Gretta Arab PharmD, BCPS Pager [phone] 05/03/2018 5:52 PM

## 2018-05-03 NOTE — Progress Notes (Signed)
Patient's bed alarmed and RN immediately went to room. Pt was standing and turned to sit on BSC. She began  falling backward and stopped responding to verbal stimuli. Pt slumped forward over on RN. RN called for emergent help. CPR began at 1416. Eulas Post, RN

## 2018-05-03 NOTE — Progress Notes (Signed)
eLink Physician-Brief Progress Note Patient Name: Melody Gray DOB: [birth year] MRN: [id number]   Date of Service  05/03/2018  HPI/Events of Note  H and P, data, labs reviewed. S/p faciotomy for post PEA arrest, left lower limb compartment syndrome from I/O line during code.  doing fine . Discussed with bed side RN.  Camera eval done.  eICU Interventions  Ok to go for NG tube instead of OG due to her trach/ET.  Continue care        Elmer Sow 05/03/2018, 10:57 PM

## 2018-05-04 ENCOUNTER — Inpatient Hospital Stay (HOSPITAL_COMMUNITY): Payer: Medicare Other

## 2018-05-04 ENCOUNTER — Encounter (HOSPITAL_COMMUNITY): Payer: Self-pay | Admitting: Pulmonary Disease

## 2018-05-04 DIAGNOSIS — Z930 Tracheostomy status: Secondary | ICD-10-CM

## 2018-05-04 DIAGNOSIS — E119 Type 2 diabetes mellitus without complications: Secondary | ICD-10-CM

## 2018-05-04 DIAGNOSIS — N170 Acute kidney failure with tubular necrosis: Principal | ICD-10-CM

## 2018-05-04 DIAGNOSIS — J9621 Acute and chronic respiratory failure with hypoxia: Secondary | ICD-10-CM

## 2018-05-04 DIAGNOSIS — Z794 Long term (current) use of insulin: Secondary | ICD-10-CM

## 2018-05-04 LAB — BLOOD CULTURE ID PANEL (REFLEXED)
Acinetobacter baumannii: NOT DETECTED
Candida albicans: NOT DETECTED
Candida glabrata: NOT DETECTED
Candida krusei: NOT DETECTED
Candida parapsilosis: NOT DETECTED
Candida tropicalis: NOT DETECTED
Enterobacter cloacae complex: NOT DETECTED
Enterobacteriaceae species: NOT DETECTED
Enterococcus species: NOT DETECTED
Escherichia coli: NOT DETECTED
Haemophilus influenzae: NOT DETECTED
Klebsiella oxytoca: NOT DETECTED
Klebsiella pneumoniae: NOT DETECTED
Listeria monocytogenes: NOT DETECTED
Methicillin resistance: DETECTED — AB
Neisseria meningitidis: NOT DETECTED
Proteus species: NOT DETECTED
Pseudomonas aeruginosa: NOT DETECTED
Serratia marcescens: NOT DETECTED
Streptococcus agalactiae: NOT DETECTED
Streptococcus pneumoniae: NOT DETECTED
Streptococcus pyogenes: NOT DETECTED
Streptococcus species: NOT DETECTED

## 2018-05-04 LAB — GLUCOSE, CAPILLARY
Glucose-Capillary: 110 mg/dL — ABNORMAL HIGH (ref 70–99)
Glucose-Capillary: 119 mg/dL — ABNORMAL HIGH (ref 70–99)
Glucose-Capillary: 127 mg/dL — ABNORMAL HIGH (ref 70–99)
Glucose-Capillary: 148 mg/dL — ABNORMAL HIGH (ref 70–99)
Glucose-Capillary: 161 mg/dL — ABNORMAL HIGH (ref 70–99)
Glucose-Capillary: 173 mg/dL — ABNORMAL HIGH (ref 70–99)

## 2018-05-04 LAB — BLOOD GAS, ARTERIAL
Acid-base deficit: 4.4 mmol/L — ABNORMAL HIGH (ref 0.0–2.0)
Bicarbonate: 20.7 mmol/L (ref 20.0–28.0)
FIO2: 70
MECHVT: 420 mL
O2 Saturation: 92.6 %
PEEP: 10 cmH2O
Patient temperature: 98.1
RATE: 28 resp/min
pCO2 arterial: 41.2 mmHg (ref 32.0–48.0)
pH, Arterial: 7.321 — ABNORMAL LOW (ref 7.350–7.450)
pO2, Arterial: 63.6 mmHg — ABNORMAL LOW (ref 83.0–108.0)

## 2018-05-04 LAB — BASIC METABOLIC PANEL
Anion gap: 8 (ref 5–15)
Anion gap: 7 (ref 5–15)
BUN: 41 mg/dL — ABNORMAL HIGH (ref 6–20)
BUN: 44 mg/dL — ABNORMAL HIGH (ref 6–20)
CO2: 20 mmol/L — ABNORMAL LOW (ref 22–32)
CO2: 20 mmol/L — ABNORMAL LOW (ref 22–32)
Calcium: 8 mg/dL — ABNORMAL LOW (ref 8.9–10.3)
Calcium: 8.2 mg/dL — ABNORMAL LOW (ref 8.9–10.3)
Chloride: 111 mmol/L (ref 98–111)
Chloride: 113 mmol/L — ABNORMAL HIGH (ref 98–111)
Creatinine, Ser: 4.88 mg/dL — ABNORMAL HIGH (ref 0.44–1.00)
Creatinine, Ser: 4.9 mg/dL — ABNORMAL HIGH (ref 0.44–1.00)
GFR calc Af Amer: 11 mL/min — ABNORMAL LOW (ref >60)
GFR calc Af Amer: 11 mL/min — ABNORMAL LOW (ref >60)
GFR calc non Af Amer: 10 mL/min — ABNORMAL LOW (ref >60)
GFR calc non Af Amer: 10 mL/min — ABNORMAL LOW (ref >60)
Glucose, Bld: 167 mg/dL — ABNORMAL HIGH (ref 70–99)
Glucose, Bld: 147 mg/dL — ABNORMAL HIGH (ref 70–99)
Potassium: 4.6 mmol/L (ref 3.5–5.1)
Potassium: 4.3 mmol/L (ref 3.5–5.1)
Sodium: 139 mmol/L (ref 135–145)
Sodium: 140 mmol/L (ref 135–145)

## 2018-05-04 LAB — TRIGLYCERIDES: Triglycerides: 189 mg/dL — ABNORMAL HIGH (ref <150)

## 2018-05-04 LAB — STREP PNEUMONIAE URINARY ANTIGEN: Strep Pneumo Urinary Antigen: NEGATIVE

## 2018-05-04 MED ORDER — VANCOMYCIN VARIABLE DOSE PER UNSTABLE RENAL FUNCTION (PHARMACIST DOSING): Status: DC

## 2018-05-04 MED ORDER — INSULIN DETEMIR 100 UNIT/ML ~~LOC~~ SOLN
20.0000 [IU] | Freq: Every day | SUBCUTANEOUS | Status: DC
  Administered 2018-05-04 – 2018-05-10 (×7): 20 [IU] via SUBCUTANEOUS
  Filled 2018-05-04 (×7): qty 0.2

## 2018-05-04 MED ORDER — INSULIN ASPART 100 UNIT/ML ~~LOC~~ SOLN
0.0000 [IU] | SUBCUTANEOUS | Status: DC
  Administered 2018-05-04: 3 [IU] via SUBCUTANEOUS
  Administered 2018-05-04: 4 [IU] via SUBCUTANEOUS
  Administered 2018-05-04 – 2018-05-05 (×2): 3 [IU] via SUBCUTANEOUS
  Administered 2018-05-05: 4 [IU] via SUBCUTANEOUS
  Administered 2018-05-06: 3 [IU] via SUBCUTANEOUS
  Administered 2018-05-07 (×3): 4 [IU] via SUBCUTANEOUS
  Administered 2018-05-07: 7 [IU] via SUBCUTANEOUS
  Administered 2018-05-07: 3 [IU] via SUBCUTANEOUS
  Administered 2018-05-07: 4 [IU] via SUBCUTANEOUS
  Administered 2018-05-09: 7 [IU] via SUBCUTANEOUS
  Administered 2018-05-09: 3 [IU] via SUBCUTANEOUS
  Administered 2018-05-09: 4 [IU] via SUBCUTANEOUS
  Administered 2018-05-10 (×2): 7 [IU] via SUBCUTANEOUS

## 2018-05-04 MED ORDER — VANCOMYCIN HCL 10 G IV SOLR
2000.0000 mg | Freq: Once | INTRAVENOUS | Status: AC
  Administered 2018-05-04: 2000 mg via INTRAVENOUS
  Filled 2018-05-04: qty 2000

## 2018-05-04 MED ORDER — LINEZOLID 600 MG/300ML IV SOLN
600.0000 mg | Freq: Two times a day (BID) | INTRAVENOUS | Status: DC
  Administered 2018-05-04 – 2018-05-11 (×16): 600 mg via INTRAVENOUS
  Filled 2018-05-04 (×18): qty 300

## 2018-05-04 MED ORDER — FAMOTIDINE IN NACL 20-0.9 MG/50ML-% IV SOLN
20.0000 mg | INTRAVENOUS | Status: DC
  Administered 2018-05-04 – 2018-05-06 (×3): 20 mg via INTRAVENOUS
  Filled 2018-05-04 (×3): qty 50

## 2018-05-04 NOTE — Progress Notes (Signed)
[facility] [place]  Subjective  - sedated on vent follows commands   Objective 104/74 90 99.1 F (37.3 C) (Oral) (!) 28 100%  Intake/Output Summary (Last 24 hours) at 05/04/2018 1217 Last data filed at 05/04/2018 1000 Gross per 24 hour  Intake 1665.65 ml  Output 150 ml  Net 1515.65 ml   Muscle pink and viable wounds clean less edema  Assessment/Planning: S/p left leg fasciotomy Will return to OR Monday for closure Family updated at bedside [facility] today if possible otherwise wet to dry BID  Ruta Hinds 05/04/2018 12:17 PM --  Laboratory Lab Results: Recent Labs    05/02/18 0815 05/03/18 1456 05/03/18 1608  WBC 9.1  --  10.1  HGB 9.9* 9.2* 9.1*  HCT 35.2* 27.0* 32.5*  PLT 148*  --  115*   BMET Recent Labs    05/03/18 1608 05/04/18 0429  NA 140 139  K 5.5* 4.6  CL 108 111  CO2 26 20*  GLUCOSE 228* 167*  BUN 42* 41*  CREATININE 4.67* 4.88*  CALCIUM 8.5* 8.0*    COAG Lab Results  Component Value Date   INR 1.13 08/12/2014   INR 0.96 11/09/2011   INR 0.95 08/30/2011   No results found for: PTT

## 2018-05-04 NOTE — Progress Notes (Signed)
Admit: 04/30/2018 LOS: 3  71F admitted with AoCKD3-4 and hyperkalemia now s/p prolonged cardiac arrest 1/3  Subjective:  . Required 4 compartment fasciotomy of left leg overnight for compartment syndrome after prolonged resuscitation for cardiac arrest . Requiring norepinephrine at low-dose for blood pressure support . 0.5 L urine output overnight . This morning serum creatinine 4.9, BUN 41, potassium 4.6, bicarbonate 20 . AM. ABG 7.32/41/63. Marland Kitchen Updated sister at bedside  01/03 0701 - 01/04 0700 In: 1130.1 [I.V.:1030.1; IV Piggyback:100] Out: 105 [Urine:80; Blood:25]  Filed Weights   05/03/18 0827 05/03/18 2048 05/04/18 0331  Weight: 100.8 kg 105.5 kg 105.5 kg    Scheduled Meds: . chlorhexidine gluconate (MEDLINE KIT)  15 mL Mouth Rinse BID  . famotidine  20 mg Oral Daily  . fluticasone  1 spray Each Nare Daily  . insulin aspart  0-5 Units Subcutaneous QHS  . insulin aspart  0-9 Units Subcutaneous TID WC  . insulin detemir  20 Units Subcutaneous QHS  . mouth rinse  15 mL Mouth Rinse 10 times per day  . sodium chloride flush  3 mL Intravenous Q12H  . sodium chloride flush  3 mL Intravenous Q12H   Continuous Infusions: . sodium chloride    . sodium chloride 75 mL/hr at 05/04/18 0525  . sodium chloride Stopped (05/03/18 2251)  . ceFEPime (MAXIPIME) IV 1 g (05/04/18 0527)  . fentaNYL infusion INTRAVENOUS 100 mcg/hr (05/04/18 0500)  . norepinephrine (LEVOPHED) Adult infusion 4 mcg/min (05/04/18 0000)  . propofol (DIPRIVAN) infusion 15 mcg/kg/min (05/04/18 0500)   PRN Meds:.sodium chloride, acetaminophen **OR** acetaminophen, albuterol, docusate, fentaNYL, fentaNYL (SUBLIMAZE) injection, fentaNYL (SUBLIMAZE) injection, ondansetron **OR** ondansetron (ZOFRAN) IV, sodium chloride flush  Current Labs: reviewed    Physical Exam:  Blood pressure 93/69, pulse 88, temperature 98.1 F (36.7 C), temperature source Oral, resp. rate (!) 28, height '5\' 1"'  (1.549 m), weight 105.5 kg, SpO2  99 %. Intubated, not interactive No sig LEE; LLE bandaged RRR nl s1s Coarse BS b/l Obese, protuberant, not firm NCAT Foley in place  A 1. AOCKD3-4 2. Hyperkalemia, stable this AM 3. Prolonged PEA cardiac arrest 05/03/17 4. Difficult airway was with cuffless trach 5. LLE Compartment syndrome s/p 4 compartment f asciotomy 05/03/17  6. Shock 7. HCAP 8. Proteinuria 9. Sarcoidosis with lung/liver involvement 10. CIrrhosis 11. pulm HTN 12. Concern for significant anoxic brain injury  P . No immediate indication for CRRT at the current time; expressed global concerns to sister  . Continue supportive measures . Very poor prognosis . Will check labs twice daily   Pearson Grippe MD 05/04/2018, 6:44 AM  Recent Labs  Lab 05/03/18 0814 05/03/18 1456 05/03/18 1608 05/04/18 0429  NA 135 141 140 139  K 6.1* 4.4 5.5* 4.6  CL 103 112* 108 111  CO2 23  --  26 20*  GLUCOSE 152* 262* 228* 167*  BUN 43* 38* 42* 41*  CREATININE 4.52* 3.60* 4.67* 4.88*  CALCIUM 8.2*  --  8.5* 8.0*  PHOS 6.2*  --   --   --    Recent Labs  Lab 04/30/18 1902  05/01/18 0538 05/02/18 0815 05/03/18 1456 05/03/18 1608  WBC 4.0  --  4.7 9.1  --  10.1  NEUTROABS 1.8  --   --  7.0  --   --   HGB 10.8*   < > 10.3* 9.9* 9.2* 9.1*  HCT 37.3   < > 36.2 35.2* 27.0* 32.5*  MCV 102.2*  --  103.4* 104.1*  --  104.8*  PLT 162  --  136* 148*  --  115*   < > = values in this interval not displayed.

## 2018-05-04 NOTE — Anesthesia Postprocedure Evaluation (Signed)
Anesthesia Post Note  Patient: Melody Gray  Procedure(s) Performed: FOUR COMPARTMENT FASCIOTOMY, LEFT LOWER LEG (Left )     Patient location during evaluation: SICU Anesthesia Type: General Level of consciousness: sedated Pain management: pain level controlled Vital Signs Assessment: post-procedure vital signs reviewed and stable Respiratory status: patient remains intubated per anesthesia plan Cardiovascular status: stable Postop Assessment: no apparent nausea or vomiting Anesthetic complications: no    Last Vitals:  Vitals:   05/04/18 0230 05/04/18 0245  BP: 101/76 104/74  Pulse: 83   Resp:    Temp:    SpO2: 99%     Last Pain:  Vitals:   05/03/18 2358  TempSrc: Axillary  PainSc:                  [name]

## 2018-05-04 NOTE — Progress Notes (Signed)
Patient ID: Melody Gray, female   DOB: 04/09/1971, 48 y.o.   MRN: [id number]  Patient has been transferred to [facility], continued medical care in the ICU and overall situation has improved significantly.  She has been more awake today.  There is concerned about weaning her off the ventilator with the endotracheal tube in place.  I was asked to come see about replacing another tracheostomy tube.  Multiple attempts were made to replace a cuffed tracheostomy tube, multiple types and sizes.  I was unable to place a #4 [place] cuffed even after dilating with a percutaneous kit dilator.  There seems to be a severely stenotic cicatrix around the opening of the trachea.  I was unable to dilate around this.  I was able to place an #7 endotracheal tube and I was able to cut this down to the size and shape of the tracheostomy tube and secured in place with straps.  This will solve the problem of weaning with a long endotracheal tube.  We can transfer this back to a #4 uncuffed when able.  If necessary she can be brought back to the operating room to revise the tracheostomy.  That should be a last resort however.

## 2018-05-04 NOTE — Progress Notes (Signed)
PHARMACY - PHYSICIAN COMMUNICATION CRITICAL VALUE ALERT - BLOOD CULTURE IDENTIFICATION (BCID)  Melody Gray is an 48 y.o. female who presented to [facility] on 04/30/2018 with AKI and hyperkalemia then developed PEA arrest requiring CPR.   Assessment: Patient remains afebrile, WBC wnl. PCT 0.42. Some concern for HCAP on chest xray. 1/3 BCx now growing MRSA. Of note, patient has AKI with SCr of 4.88. No plans for CRRT at the moment per renal.   Name of physician (or Provider) Contacted: Noe Gens   Current antibiotics: Cefepime 1 gm IV Q 12 hours  Changes to prescribed antibiotics recommended: Add IV vancomycin to cover MRSA bacteremia. ID should be automatically consulted  CCM starting IV linezolid given AKI   Results for orders placed or performed during the hospital encounter of 04/30/18  Blood Culture ID Panel (Reflexed) (Collected: 05/03/2018  5:25 PM)  Result Value Ref Range   Enterococcus species NOT DETECTED NOT DETECTED   Listeria monocytogenes NOT DETECTED NOT DETECTED   Staphylococcus species (A) NOT DETECTED    CRITICAL RESULT CALLED TO, READ BACK BY AND VERIFIED WITH:   Staphylococcus aureus (BCID) (A) NOT DETECTED    CRITICAL RESULT CALLED TO, READ BACK BY AND VERIFIED WITH:   Methicillin resistance DETECTED (A) NOT DETECTED   Streptococcus species NOT DETECTED NOT DETECTED   Streptococcus agalactiae NOT DETECTED NOT DETECTED   Streptococcus pneumoniae NOT DETECTED NOT DETECTED   Streptococcus pyogenes NOT DETECTED NOT DETECTED   Acinetobacter baumannii NOT DETECTED NOT DETECTED   Enterobacteriaceae species NOT DETECTED NOT DETECTED   Enterobacter cloacae complex NOT DETECTED NOT DETECTED   Escherichia coli NOT DETECTED NOT DETECTED   Klebsiella oxytoca NOT DETECTED NOT DETECTED   Klebsiella pneumoniae NOT DETECTED NOT DETECTED   Proteus species NOT DETECTED NOT DETECTED   Serratia marcescens NOT DETECTED NOT DETECTED   Haemophilus influenzae NOT DETECTED  NOT DETECTED   Neisseria meningitidis NOT DETECTED NOT DETECTED   Pseudomonas aeruginosa NOT DETECTED NOT DETECTED   Candida albicans NOT DETECTED NOT DETECTED   Candida glabrata NOT DETECTED NOT DETECTED   Candida krusei NOT DETECTED NOT DETECTED   Candida parapsilosis NOT DETECTED NOT DETECTED   Candida tropicalis NOT DETECTED NOT DETECTED    Albertina Parr, PharmD., BCPS Clinical Pharmacist Clinical phone for 05/04/18 until 3:30pm: [phone] If after 3:30pm, please refer to [facility] for unit-specific pharmacist

## 2018-05-04 NOTE — Progress Notes (Signed)
Initial Nutrition Assessment  DOCUMENTATION CODES:   Morbid obesity  INTERVENTION:  If unable to extubate, Recommend TF with Vital High Protein at goal rate of 50 ml/h (1200 ml per day) and Prostat 30 ml once daily.  Tube feeding regimen with current propofol rate to provide 1379 kcals, 120 gm protein, 1008 ml free water daily.  NUTRITION DIAGNOSIS:   Inadequate oral intake related to inability to eat as evidenced by NPO status.  GOAL:   Patient will meet greater than or equal to 90% of their needs  MONITOR:   Vent status, Skin, Weight trends, I & O's, Labs  REASON FOR ASSESSMENT:   Ventilator    ASSESSMENT:   48 y.o., female with chronic trach, cirrhosis, CHF admitted on 04/30/2018 for acute renal failure, suffered cardiac arrest, PEA, with ROSC after ~40 mins, neuro intact after arrest, left LLE IO line, acute LLE compartment syndrome taken for fasciotomy.    Procedure(1/3): FOUR COMPARTMENT FASCIOTOMY, LEFT LOWER LEG (Left)  Patient is currently intubated on ventilator support MV: 11 L/min Temp (24hrs), Avg:97 F (36.1 C), Min:92.1 F (33.4 C), Max:99.2 F (37.3 C)  Propofol: 3 ml/hr which provides 79.2 kcal/day  Family at bedside. She reports pt was eating well PTA with no other difficulties. Noted pt with chronic trach. Plan for trach exchange this AM, however unable to replace with actual trach tube, thus #7 ETT was placed as trach replacement.   Labs and medications reviewed.   NUTRITION - FOCUSED PHYSICAL EXAM:    Most Recent Value  Orbital Region  No depletion  Upper Arm Region  No depletion  Thoracic and Lumbar Region  No depletion  Buccal Region  No depletion  Temple Region  No depletion  Clavicle Bone Region  No depletion  Clavicle and Acromion Bone Region  No depletion  Scapular Bone Region  Unable to assess  Dorsal Hand  Unable to assess  Patellar Region  No depletion  Anterior Thigh Region  No depletion  Posterior Calf Region  No depletion   Edema (RD Assessment)  Mild  Hair  Reviewed  Eyes  Unable to assess  Mouth  Unable to assess  Skin  Reviewed  Nails  Unable to assess       Diet Order:   Diet Order            Diet NPO time specified Except for: Sips with Meds  Diet effective midnight        Diet NPO time specified  Diet effective now              EDUCATION NEEDS:   Not appropriate for education at this time  Skin:  Skin Assessment: Skin Integrity Issues: Skin Integrity Issues:: Incisions Incisions: L leg  Last BM:  1/2  Height:   Ht Readings from Last 1 Encounters:  05/03/18 5\' 1"  (1.549 m)    Weight:   Wt Readings from Last 1 Encounters:  05/04/18 105.5 kg  Admit weight: 100.1 kg (221 lbs) Net I/O's: +3 L   Ideal Body Weight:  47.7 kg  BMI:  Body mass index is 43.95 kg/m.  Estimated Nutritional Needs:   Kcal:  2993-7169  Protein:  119-129 grams  Fluid:  >/= 1.5 L/day    Corrin Parker, MS, RD, LDN Pager # [phone] After hours/ weekend pager # [phone]

## 2018-05-04 NOTE — Progress Notes (Signed)
NAME:  Melody Gray, MRN:  [id number], DOB:  1971-02-17, LOS: 3 ADMISSION DATE:  04/30/2018, CONSULTATION DATE:  05/03/18 REFERRING MD:  Dr. Valeta Harms, CHIEF COMPLAINT:  Cardiac arrest    Brief History   48 y/o F with sarcoidosis & chronic tracheostomy in the setting of OHS admitted 12/31 with AKI and hyperkalemia.  Developed PEA arrest after admit requiring CPR ~ 40 minutes.  She required an emergent trach change during the arrest for ventilation which was unsuccessful.  ETT intubation was unsuccessful as well.  ENT placed an ETT via her tracheostomy stoma.  She was then found to have a pulseless left extremity and was placed on anticoagulation.  Tx to [facility] for VVS evaluation.    Past Medical History  Sarcoidosis - lung, liver involvement  OSA/OHS s/p trach  Pulmonary HTN - pa peak 83 on ECHO 07/2014 HTN DM  CKD II NICM - LVEF 55-60% in 07/2014   Significant Hospital Events   12/31  Admit with AKI, hyperkalemia  01/03  PEA arrest, 40 min before ROSC.  DIFFICULT AIRWAY.   Consults:  VVS  Procedures:  L I-O 1/3 >> 1/3  ALine 1/3 >>  TLC 1/3 >>  ETT via Stoma 1/3 >>   Significant Diagnostic Tests:  CT ABD/Pelvis 1/3 >> large LLL PNA, probable RML as well, hepatic cirrhosis, stable 3.1 cm low density in midpole of left kidney  Micro Data:  BCx2 1/3 >> 1/2 with GPC's >>  Tracheal aspirate 1/4 >>  UC 1/3 >>   Antimicrobials:  Levaquin 1/3 >> 1/3  Cefepime 1/3 >>   Interim history/subjective:  RN reports pt remains on levophed but weaning.  ETT in stoma.  No acute events overnight.   Objective   Blood pressure (!) 102/58, pulse 86, temperature 98.1 F (36.7 C), temperature source Oral, resp. rate (!) 28, height 5\' 1"  (1.549 m), weight 105.5 kg, SpO2 99 %.    Vent Mode: PRVC FiO2 (%):  [60 %-100 %] 60 % Set Rate:  [25 bmp-28 bmp] 28 bmp Vt Set:  [380 mL-420 mL] 420 mL PEEP:  [5 cmH20-10 cmH20] 10 cmH20 Plateau Pressure:  [27 cmH20-33 cmH20] 27 cmH20   Intake/Output  Summary (Last 24 hours) at 05/04/2018 3662 Last data filed at 05/04/2018 0600 Gross per 24 hour  Intake 1288.44 ml  Output 105 ml  Net 1183.44 ml   Filed Weights   05/03/18 0827 05/03/18 2048 05/04/18 0331  Weight: 100.8 kg 105.5 kg 105.5 kg    Examination: General: adult female lying in bed on vent in NAD HEENT: MM pink/moist, ETT secured at 14 cm via trach stoma  Neuro: Awakens, alert, follows commands, nods / interacts appropriately  CV: s1s2 rrr, no m/r/g PULM: even/non-labored, lungs bilaterally with wheezing  HU:TMLY, non-tender, bsx4 active  Extremities: warm/dry, trace UE edema. LLE wrapped in ACE dressing, c/d/i.  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 If she would be a long term HD candidate due to chronic trach (even though she is independent)  Hepatic Cirrhosis  P: Supportive care   Best practice:  Diet: NPO Pain/Anxiety/Delirium protocol (if indicated):  Fentanyl + propofol VAP protocol (if indicated): in place  DVT prophylaxis: SCD on RLE  GI prophylaxis: pepcid  Glucose control: SSI Mobility: bed rest  Code Status: Full code  Family Communication: Sister Justice Rocher, works in food services at [employer]) updated at bedside 1/4 Disposition: ICU  Labs   CBC: Recent Labs  Lab 04/30/18 1902 04/30/18 2116 05/01/18 0538 05/02/18 0815 05/03/18 1456 05/03/18 1608  WBC 4.0  --  4.7 9.1  --  10.1  NEUTROABS 1.8  --   --  7.0  --   --   HGB 10.8* 11.9* 10.3* 9.9* 9.2* 9.1*  HCT 37.3 35.0* 36.2 35.2* 27.0* 32.5*  MCV 102.2*  --  103.4* 104.1*  --  104.8*  PLT 162  --  136* 148*  --  115*    Basic Metabolic Panel: Recent Labs  Lab 04/30/18 2035  05/01/18 0538  05/02/18 0815 05/02/18 1944 05/03/18 0814 05/03/18 1456 05/03/18 1608 05/04/18 0429  NA  --    < > 141  --  139  --  135 141 140 139  K  --    < > 6.6*   < > 5.3* 6.2* 6.1* 4.4 5.5* 4.6  CL  --    < > 111  --  109  --  103 112* 108 111  CO2  --   --  24  --  25  --  23  --  26 20*  GLUCOSE  --    < > 202*  --  163*  --  152* 262* 228* 167*  BUN  --    < > 38*  --  34*  --  43* 38* 42* 41*  CREATININE  --    < > 3.22*  --  3.22*  --  4.52* 3.60* 4.67* 4.88*  CALCIUM  --   --  8.7*  --  8.1*  --  8.2*  --  8.5* 8.0*  MG 2.4  --   --   --   --   --   --   --   --   --   PHOS  --   --   --   --   --   --  6.2*  --   --   --    < > = values in this interval not displayed.   GFR: Estimated Creatinine Clearance: 15.6 mL/min (A) (by C-G formula based on SCr of 4.88 mg/dL (H)). Recent Labs  Lab 04/30/18 1902 05/01/18 0538 05/02/18 0815 05/03/18 1452 05/03/18 1453 05/03/18 1608  PROCALCITON  --   --   --   --   --  0.42  WBC 4.0 4.7 9.1  --   --  10.1  LATICACIDVEN  --   --   --  1.72 1.75 1.2    Liver Function Tests: Recent Labs  Lab 04/30/18 1902 05/03/18 0814 05/03/18 1608  AST 28  --  110*  ALT 24  --  82*  ALKPHOS 209*  --  224*  BILITOT 0.3  --  0.6  PROT 7.5   --  6.5  ALBUMIN 3.5 3.0* 2.6*   No results for input(s): LIPASE, AMYLASE in the last 168 hours. No results for input(s): AMMONIA in the last 168 hours.  ABG    Component Value Date/Time   PHART 7.321 (L) 05/04/2018 0355   PCO2ART 41.2 05/04/2018 0355   PO2ART 63.6 (L) 05/04/2018 0355   HCO3 20.7 05/04/2018 0355   TCO2 23 05/03/2018 1456   ACIDBASEDEF 4.4 (H) 05/04/2018 0355   O2SAT 92.6 05/04/2018 0355     Coagulation Profile: No results for input(s): INR, PROTIME in the last 168 hours.  Cardiac Enzymes: No results for input(s): CKTOTAL, CKMB, CKMBINDEX, TROPONINI in the last 168 hours.  HbA1C: Hemoglobin A1C  Date/Time Value Ref Range Status  04/16/2018 04:15 PM 9.5 (A) 4.0 - 5.6 % Final  01/17/2018 01:34 PM 7.8 (A) 4.0 - 5.6 % Final   Hgb A1c MFr Bld  Date/Time Value Ref Range Status  05/06/2014 12:39 AM 8.6 (H) <5.7 % Final    Comment:    (NOTE)                                                                       According to the ADA Clinical Practice Recommendations for 2011, when HbA1c is used as a screening test:  >=6.5%   Diagnostic of Diabetes Mellitus           (if abnormal result is confirmed) 5.7-6.4%   Increased risk of developing Diabetes Mellitus References:Diagnosis and Classification of Diabetes Mellitus,Diabetes VFIE,3329,51(OACZY 1):S62-S69 and Standards of Medical Care in         Diabetes - 2011,Diabetes SAYT,0160,10 (Suppl 1):S11-S61.   08/30/2013 05:47 AM 8.1 (H) <5.7 % Final    Comment:    (NOTE)                                                                       According to the ADA Clinical Practice Recommendations for 2011, when HbA1c is used as a screening test:  >=6.5%   Diagnostic of Diabetes Mellitus           (if abnormal result is confirmed) 5.7-6.4%   Increased risk of developing Diabetes Mellitus References:Diagnosis and Classification of Diabetes Mellitus,Diabetes XNAT,5573,22(GURKY 1):S62-S69 and Standards of Medical Care  in         Diabetes - 2011,Diabetes Care,2011,34 (Suppl 1):S11-S61.    CBG: Recent Labs  Lab 05/03/18 1142 05/03/18 2243 05/03/18 2314 05/04/18 0310 05/04/18 0754  GLUCAP 125* 236* 230* 173* 110*     Critical care time: 32 minutes      Noe Gens, NP-C [facility] Pgr: [phone] or if no answer [phone] 05/04/2018, 8:07 AM

## 2018-05-05 ENCOUNTER — Inpatient Hospital Stay (HOSPITAL_COMMUNITY): Payer: Medicare Other

## 2018-05-05 LAB — GLUCOSE, CAPILLARY
Glucose-Capillary: 111 mg/dL — ABNORMAL HIGH (ref 70–99)
Glucose-Capillary: 97 mg/dL (ref 70–99)
Glucose-Capillary: 108 mg/dL — ABNORMAL HIGH (ref 70–99)
Glucose-Capillary: 124 mg/dL — ABNORMAL HIGH (ref 70–99)
Glucose-Capillary: 112 mg/dL — ABNORMAL HIGH (ref 70–99)
Glucose-Capillary: 151 mg/dL — ABNORMAL HIGH (ref 70–99)

## 2018-05-05 LAB — BASIC METABOLIC PANEL
Anion gap: 8 (ref 5–15)
BUN: 43 mg/dL — ABNORMAL HIGH (ref 6–20)
CO2: 19 mmol/L — ABNORMAL LOW (ref 22–32)
Calcium: 8.3 mg/dL — ABNORMAL LOW (ref 8.9–10.3)
Chloride: 112 mmol/L — ABNORMAL HIGH (ref 98–111)
Creatinine, Ser: 5.01 mg/dL — ABNORMAL HIGH (ref 0.44–1.00)
GFR calc Af Amer: 11 mL/min — ABNORMAL LOW (ref >60)
GFR calc non Af Amer: 9 mL/min — ABNORMAL LOW (ref >60)
Glucose, Bld: 121 mg/dL — ABNORMAL HIGH (ref 70–99)
Potassium: 4.1 mmol/L (ref 3.5–5.1)
Sodium: 139 mmol/L (ref 135–145)

## 2018-05-05 LAB — URINE CULTURE: Culture: NO GROWTH

## 2018-05-05 LAB — PROCALCITONIN: Procalcitonin: 1.55 ng/mL

## 2018-05-05 MED ORDER — FENTANYL CITRATE (PF) 100 MCG/2ML IJ SOLN
50.0000 ug | INTRAMUSCULAR | Status: DC | PRN
  Administered 2018-05-05 – 2018-05-10 (×18): 50 ug via INTRAVENOUS
  Filled 2018-05-05 (×18): qty 2

## 2018-05-05 NOTE — Progress Notes (Signed)
Admit: 04/30/2018 LOS: 4  10F admitted with AoCKD3-4 and hyperkalemia now s/p prolonged cardiac arrest 1/3  Subjective:  . Awake, alert, following commands this morning . On low-dose norepinephrine . 70% FiO2 . 0.7 L urine output yesterday . Fairly stable renal function, creatinine 5.0, BUN 43, potassium 4.1, bicarbonate 19, anion gap 8. . Updated sister at bedside  01/04 0701 - 01/05 0700 In: 3481.6 [I.V.:2430; IV Piggyback:1051.6] Out: 905 [Urine:655; Emesis/NG output:250]  Filed Weights   05/03/18 2048 05/04/18 0331 05/05/18 0637  Weight: 105.5 kg 105.5 kg 107.5 kg    Scheduled Meds: . chlorhexidine gluconate (MEDLINE KIT)  15 mL Mouth Rinse BID  . fluticasone  1 spray Each Nare Daily  . insulin aspart  0-20 Units Subcutaneous Q4H  . insulin detemir  20 Units Subcutaneous QHS  . mouth rinse  15 mL Mouth Rinse 10 times per day  . sodium chloride flush  3 mL Intravenous Q12H  . sodium chloride flush  3 mL Intravenous Q12H   Continuous Infusions: . sodium chloride    . sodium chloride 75 mL/hr at 05/05/18 0700  . sodium chloride Stopped (05/03/18 2251)  . ceFEPime (MAXIPIME) IV Stopped (05/05/18 0530)  . famotidine (PEPCID) IV Stopped (05/04/18 1554)  . fentaNYL infusion INTRAVENOUS 150 mcg/hr (05/05/18 0700)  . linezolid (ZYVOX) IV Stopped (05/04/18 2210)  . norepinephrine (LEVOPHED) Adult infusion 2 mcg/min (05/05/18 0700)  . propofol (DIPRIVAN) infusion 30 mcg/kg/min (05/05/18 0700)   PRN Meds:.sodium chloride, acetaminophen **OR** acetaminophen, albuterol, docusate, fentaNYL, fentaNYL (SUBLIMAZE) injection, ondansetron **OR** ondansetron (ZOFRAN) IV, sodium chloride flush  Current Labs: reviewed    Physical Exam:  Blood pressure 110/69, pulse 74, temperature 99.1 F (37.3 C), temperature source Axillary, resp. rate (!) 28, height _0  (1.549 m), weight 107.5 kg, SpO2 100 %. Intubated through tracheostomy No sig LEE; LLE bandaged RRR nl s1s Coarse BS  b/l Obese, protuberant, not firm NCAT Foley in place Alert, interactive, follows commands on both sides  A 1. AOCKD3-4 2. Hyperkalemia, resolved 3. Prolonged PEA cardiac arrest 05/03/17, doing remarkably well at the current time 4. Difficult airway was with cuffless trach, ENT following 5. LLE Compartment syndrome s/p 4 compartment f asciotomy 05/03/17  6. Shock, improving remains on low-dose norepinephrine 7. HCAP Per CCM 8. Proteinuria 9. Sarcoidosis with lung/liver involvement 10. CIrrhosis 11. pulm HTN  P . Doing remarkably well 24 hours after prolonged cardiac arrest . No indication for CRRT at the current time, given reassurance that she has retained some neurological function I think it would be reasonable if necessary to provide RRT . Continue supportive measures  Pearson Grippe MD 05/05/2018, 8:45 AM  Recent Labs  Lab 05/03/18 0814  05/04/18 0429 05/04/18 1703 05/05/18 0552  NA 135   < > 139 140 139  K 6.1*   < > 4.6 4.3 4.1  CL 103   < > 111 113* 112*  CO2 23   < > 20* 20* 19*  GLUCOSE 152*   < > 167* 147* 121*  BUN 43*   < > 41* 44* 43*  CREATININE 4.52*   < > 4.88* 4.90* 5.01*  CALCIUM 8.2*   < > 8.0* 8.2* 8.3*  PHOS 6.2*  --   --   --   --    < > = values in this interval not displayed.   Recent Labs  Lab 04/30/18 1902  05/01/18 0538 05/02/18 0815 05/03/18 1456 05/03/18 1608  WBC 4.0  --  4.7 9.1  --  10.1  NEUTROABS 1.8  --   --  7.0  --   --   HGB 10.8*   < > 10.3* 9.9* 9.2* 9.1*  HCT 37.3   < > 36.2 35.2* 27.0* 32.5*  MCV 102.2*  --  103.4* 104.1*  --  104.8*  PLT 162  --  136* 148*  --  115*   < > = values in this interval not displayed.

## 2018-05-05 NOTE — Progress Notes (Signed)
Attempted ATC per Dr. Valeta Harms, pt was on 40% for 3 minutes and vitals were stable but was complaining she could not breathe. Dr. Valeta Harms came to bedside and stated place pt back on the vent on Pressure support.

## 2018-05-05 NOTE — Progress Notes (Signed)
NAME:  Melody Gray, MRN:  [id number], DOB:  05/26/1970, LOS: 4 ADMISSION DATE:  04/30/2018, CONSULTATION DATE:  05/03/18 REFERRING MD:  Dr. Valeta Harms, CHIEF COMPLAINT:  Cardiac arrest    Brief History   48 y/o F with sarcoidosis & chronic tracheostomy in the setting of OHS admitted 12/31 with AKI and hyperkalemia.  Developed PEA arrest after admit requiring CPR ~ 40 minutes.  She required an emergent trach change during the arrest for ventilation which was unsuccessful.  ETT intubation was unsuccessful as well.  ENT placed an ETT via her tracheostomy stoma.  She was then found to have a pulseless left extremity and was placed on anticoagulation.  Tx to [facility] for VVS evaluation.    Past Medical History  Sarcoidosis - lung, liver involvement  OSA/OHS s/p trach  Pulmonary HTN - pa peak 83 on ECHO 07/2014 HTN DM  CKD II NICM - LVEF 55-60% in 07/2014   Significant Hospital Events   12/31  Admit with AKI, hyperkalemia  01/03  PEA arrest, 40 min before ROSC.  DIFFICULT AIRWAY.  01/04  ENT created new trach tube out of #7 ETT   Consults:  VVS  Procedures:  L I-O 1/3 >> 1/3  ALine 1/3 >>  TLC 1/3 >>  ETT via Stoma 1/3 >>   Significant Diagnostic Tests:  CT ABD/Pelvis 1/3 >> large LLL PNA, probable RML as well, hepatic cirrhosis, stable 3.1 cm low density in midpole of left kidney  Micro Data:  BCx2 1/3 >> 1/2 with GPC's >>  Tracheal aspirate 1/4 >>  UC 1/3 >>   Antimicrobials:  Levaquin 1/3 >> 1/3  Cefepime 1/3 >>   Interim history/subjective:  Tmax 99.1.  RT reports pt not getting full return volumes on vent. Episodes of desaturation overnight on vent.  Remains on 60%/10 PEEP.  Objective   Blood pressure 110/69, pulse 74, temperature 99.1 F (37.3 C), temperature source Axillary, resp. rate (!) 28, height 5\' 1"  (1.549 m), weight 107.5 kg, SpO2 100 %.    Vent Mode: PRVC FiO2 (%):  [40 %-100 %] 70 % Set Rate:  [28 bmp] 28 bmp Vt Set:  [420 mL] 420 mL PEEP:  [10 cmH20]  10 cmH20 Plateau Pressure:  [25 cmH20-33 cmH20] 33 cmH20   Intake/Output Summary (Last 24 hours) at 05/05/2018 0834 Last data filed at 05/05/2018 0700 Gross per 24 hour  Intake 3387.4 ml  Output 860 ml  Net 2527.4 ml   Filed Weights   05/03/18 2048 05/04/18 0331 05/05/18 0637  Weight: 105.5 kg 105.5 kg 107.5 kg    Examination: General: adult female lying in bed in NAD on vent   HEENT: MM pink/moist, mild air leak from stoma site, ETT in place in trach stoma / secure  Neuro: Awakens to voice, nods/follows commands CV: s1s2 rrr, no m/r/g PULM: even/non-labored, lungs bilaterally diminished on left but clear throughout  YW:VPXT, non-tender, bsx4 active  Extremities: warm/dry, no edema  Skin: no rashes or lesions  Resolved Hospital Problem list      Assessment & Plan:   PEA Arrest  R/O Anoxic Injury  -40 min before ROSC, following commands after  P: Continue to monitor in ICU Early PT efforts   Left Leg Compartment Syndrome P: Per VVS Follow neurovascular checks   Acute on Chronic Hypoxemic Respiratory Failure  -difficult intubation, ETT via stoma  -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  Hoping to wean from vent and return to #4cuffless (would not require OR/upsize) Follow intermittent CXR   Cardiogenic Shock P: Wean levophed for MAP >65   Pulmonary HTN -in setting of sarcoidosis  P: Supportive care   Acute on Chronic Renal Failure  P: Appreciate Nephrology  Trend BMP / urinary output Replace electrolytes as indicated Avoid nephrotoxic agents, ensure adequate renal perfusion  Hepatic Cirrhosis  P: Supportive care   Best practice:  Diet:  NPO Pain/Anxiety/Delirium protocol (if indicated): Fentanyl  VAP protocol (if indicated): in place  DVT prophylaxis: SCD on RLE  GI prophylaxis: pepcid  Glucose control: SSI Mobility: bed rest  Code Status: Full code  Family Communication: Sister Justice Rocher, works in food services at [employer]) updated at bedside 1/4.  No family at bedside am 1/5.  Disposition: ICU  Labs   CBC: Recent Labs  Lab 04/30/18 1902 04/30/18 2116 05/01/18 0538 05/02/18 0815 05/03/18 1456 05/03/18 1608  WBC 4.0  --  4.7 9.1  --  10.1  NEUTROABS 1.8  --   --  7.0  --   --   HGB 10.8* 11.9* 10.3* 9.9* 9.2* 9.1*  HCT 37.3 35.0* 36.2 35.2* 27.0* 32.5*  MCV 102.2*  --  103.4* 104.1*  --  104.8*  PLT 162  --  136* 148*  --  115*    Basic Metabolic Panel: Recent Labs  Lab 04/30/18 2035  05/03/18 0814 05/03/18 1456 05/03/18 1608 05/04/18 0429 05/04/18 1703 05/05/18 0552  NA  --    < > 135 141 140 139 140 139  K  --    < > 6.1* 4.4 5.5* 4.6 4.3 4.1  CL  --    < > 103 112* 108 111 113* 112*  CO2  --    < > 23  --  26 20* 20* 19*  GLUCOSE  --    < > 152* 262* 228* 167* 147* 121*  BUN  --    < > 43* 38* 42* 41* 44* 43*  CREATININE  --    < > 4.52* 3.60* 4.67* 4.88* 4.90* 5.01*  CALCIUM  --    < > 8.2*  --  8.5* 8.0* 8.2* 8.3*  MG 2.4  --   --   --   --   --   --   --   PHOS  --   --  6.2*  --   --   --   --   --    < > = values in this interval not displayed.   GFR: Estimated Creatinine Clearance: 15.4 mL/min (A) (by C-G formula based on SCr of 5.01 mg/dL (H)). Recent Labs  Lab 04/30/18 1902 05/01/18 0538 05/02/18 0815 05/03/18 1452 05/03/18 1453 05/03/18 1608 05/05/18 0552  PROCALCITON  --   --   --   --   --  0.42 1.55  WBC 4.0 4.7 9.1  --   --  10.1  --   LATICACIDVEN  --   --   --  1.72 1.75 1.2  --     Liver Function Tests: Recent Labs  Lab 04/30/18 1902 05/03/18 0814 05/03/18 1608  AST 28  --  110*  ALT 24  --  82*  ALKPHOS 209*  --  224*  BILITOT 0.3  --  0.6  PROT 7.5  --  6.5   ALBUMIN 3.5 3.0* 2.6*   No results for input(s): LIPASE, AMYLASE in the last 168 hours. No results for input(s): AMMONIA in the last 168 hours.  ABG    Component Value Date/Time   PHART 7.321 (L) 05/04/2018 0355   PCO2ART 41.2 05/04/2018 0355   PO2ART 63.6 (L) 05/04/2018 0355   HCO3 20.7 05/04/2018 0355   TCO2 23 05/03/2018 1456   ACIDBASEDEF 4.4 (H) 05/04/2018 0355   O2SAT 92.6 05/04/2018 0355     Coagulation Profile: No results for input(s): INR, PROTIME in the last 168 hours.  Cardiac Enzymes: No results for input(s): CKTOTAL, CKMB, CKMBINDEX, TROPONINI in the last 168 hours.  HbA1C: Hemoglobin A1C  Date/Time Value Ref Range Status  04/16/2018 04:15 PM 9.5 (A) 4.0 - 5.6 % Final  01/17/2018 01:34 PM 7.8 (A) 4.0 - 5.6 % Final   Hgb A1c MFr Bld  Date/Time Value Ref Range Status  05/06/2014 12:39 AM 8.6 (H) <5.7 % Final    Comment:    (NOTE)                                                                       According to the ADA Clinical Practice Recommendations for 2011, when HbA1c is used as a screening test:  >=6.5%   Diagnostic of Diabetes Mellitus           (if abnormal result is confirmed) 5.7-6.4%   Increased risk of developing Diabetes Mellitus References:Diagnosis and Classification of Diabetes Mellitus,Diabetes YHCW,2376,28(BTDVV 1):S62-S69 and Standards of Medical Care in         Diabetes - 2011,Diabetes OHYW,7371,06 (Suppl 1):S11-S61.   08/30/2013 05:47 AM 8.1 (H) <5.7 % Final    Comment:    (NOTE)                                                                       According to the ADA Clinical Practice Recommendations for 2011, when HbA1c is used as a screening test:  >=6.5%   Diagnostic of Diabetes Mellitus           (if abnormal result is confirmed) 5.7-6.4%   Increased risk of developing Diabetes Mellitus References:Diagnosis and Classification of Diabetes Mellitus,Diabetes YIRS,8546,27(OJJKK 1):S62-S69 and Standards of Medical Care in          Diabetes - 2011,Diabetes Care,2011,34 (Suppl 1):S11-S61.    CBG: Recent Labs  Lab 05/04/18 1555 05/04/18 1959 05/04/18 2332 05/05/18 0349 05/05/18 0811  GLUCAP 127* 148* 161* 111* 97     Critical care time: 30 minutes      Noe Gens, NP-C [place] Pgr: [phone] or if no answer [phone] 05/05/2018, 8:34 AM

## 2018-05-05 NOTE — Progress Notes (Signed)
eLink Physician-Brief Progress Note Patient Name: Lakresha Stifter DOB: 02-16-1971 MRN: [id number]   Date of Service  05/05/2018  HPI/Events of Note  L lung whiteout.   eICU Interventions  Will order: 1. Chest PT and postural drainage now and Q 4 hours.  2. Repeat CXR in AM.         Sommer,Steven Eugene 05/05/2018, 10:22 PM

## 2018-05-05 NOTE — Progress Notes (Signed)
[facility]  Subjective  - sedated on vent but much more interactive   Objective 131/79 88 98.9 F (37.2 C) (Axillary) (!) 27 93%  Intake/Output Summary (Last 24 hours) at 05/05/2018 1110 Last data filed at 05/05/2018 0700 Gross per 24 hour  Intake 3198.46 ml  Output 860 ml  Net 2338.46 ml   Left foot 2+ DP pulse dressing dry  Assessment/Planning: To OR tomorrow for fasciotomy closure Consent NPo p midnight Family updated at bedside  Ruta Hinds 05/05/2018 11:10 AM --  Laboratory Lab Results: Recent Labs    05/03/18 1456 05/03/18 1608  WBC  --  10.1  HGB 9.2* 9.1*  HCT 27.0* 32.5*  PLT  --  115*   BMET Recent Labs    05/04/18 1703 05/05/18 0552  NA 140 139  K 4.3 4.1  CL 113* 112*  CO2 20* 19*  GLUCOSE 147* 121*  BUN 44* 43*  CREATININE 4.90* 5.01*  CALCIUM 8.2* 8.3*    COAG Lab Results  Component Value Date   INR 1.13 08/12/2014   INR 0.96 11/09/2011   INR 0.95 08/30/2011   No results found for: PTT

## 2018-05-05 NOTE — Progress Notes (Signed)
Patient ID: Melody Gray, female   DOB: [birth year], 48 y.o.   MRN: [id number] Having some mild difficulty ventilating through the night.  X-ray shows tube in the right mainstem.  A new tube was fashioned, this time at 12 cm and was replaced.  Excellent bilateral breath sounds.  Airway stable.  Will follow.

## 2018-05-06 ENCOUNTER — Inpatient Hospital Stay (HOSPITAL_COMMUNITY): Payer: Medicare Other | Admitting: Registered Nurse

## 2018-05-06 ENCOUNTER — Encounter (HOSPITAL_COMMUNITY): Payer: Self-pay | Admitting: Vascular Surgery

## 2018-05-06 ENCOUNTER — Inpatient Hospital Stay (HOSPITAL_COMMUNITY): Payer: Medicare Other

## 2018-05-06 ENCOUNTER — Encounter (HOSPITAL_COMMUNITY)
Admission: EM | Disposition: A | Discharge status: Home Health | Payer: Self-pay | Source: Home / Self Care | Attending: Internal Medicine

## 2018-05-06 DIAGNOSIS — J189 Pneumonia, unspecified organism: Secondary | ICD-10-CM | POA: Diagnosis present

## 2018-05-06 DIAGNOSIS — R7881 Bacteremia: Secondary | ICD-10-CM

## 2018-05-06 DIAGNOSIS — J181 Lobar pneumonia, unspecified organism: Secondary | ICD-10-CM

## 2018-05-06 DIAGNOSIS — E662 Morbid (severe) obesity with alveolar hypoventilation: Secondary | ICD-10-CM

## 2018-05-06 DIAGNOSIS — I5032 Chronic diastolic (congestive) heart failure: Secondary | ICD-10-CM

## 2018-05-06 DIAGNOSIS — I110 Hypertensive heart disease with heart failure: Secondary | ICD-10-CM

## 2018-05-06 DIAGNOSIS — B9562 Methicillin resistant Staphylococcus aureus infection as the cause of diseases classified elsewhere: Secondary | ICD-10-CM

## 2018-05-06 DIAGNOSIS — Z720 Tobacco use: Secondary | ICD-10-CM

## 2018-05-06 DIAGNOSIS — Z8674 Personal history of sudden cardiac arrest: Secondary | ICD-10-CM

## 2018-05-06 DIAGNOSIS — Z885 Allergy status to narcotic agent status: Secondary | ICD-10-CM

## 2018-05-06 HISTORY — PX: FASCIOTOMY CLOSURE: SHX5829

## 2018-05-06 HISTORY — DX: Methicillin resistant Staphylococcus aureus infection as the cause of diseases classified elsewhere: B95.62

## 2018-05-06 HISTORY — DX: Bacteremia: R78.81

## 2018-05-06 LAB — BASIC METABOLIC PANEL
Anion gap: 7 (ref 5–15)
BUN: 42 mg/dL — ABNORMAL HIGH (ref 6–20)
CO2: 18 mmol/L — ABNORMAL LOW (ref 22–32)
Calcium: 8.3 mg/dL — ABNORMAL LOW (ref 8.9–10.3)
Chloride: 115 mmol/L — ABNORMAL HIGH (ref 98–111)
Creatinine, Ser: 4.64 mg/dL — ABNORMAL HIGH (ref 0.44–1.00)
GFR calc Af Amer: 12 mL/min — ABNORMAL LOW (ref >60)
GFR calc non Af Amer: 10 mL/min — ABNORMAL LOW (ref >60)
Glucose, Bld: 103 mg/dL — ABNORMAL HIGH (ref 70–99)
Potassium: 4.2 mmol/L (ref 3.5–5.1)
Sodium: 140 mmol/L (ref 135–145)

## 2018-05-06 LAB — PHOSPHORUS: Phosphorus: 4.3 mg/dL (ref 2.5–4.6)

## 2018-05-06 LAB — CULTURE, RESPIRATORY W GRAM STAIN: Culture: NORMAL

## 2018-05-06 LAB — CULTURE, BLOOD (ROUTINE X 2): Special Requests: ADEQUATE

## 2018-05-06 LAB — GLUCOSE, CAPILLARY
Glucose-Capillary: 103 mg/dL — ABNORMAL HIGH (ref 70–99)
Glucose-Capillary: 91 mg/dL (ref 70–99)
Glucose-Capillary: 80 mg/dL (ref 70–99)
Glucose-Capillary: 94 mg/dL (ref 70–99)
Glucose-Capillary: 123 mg/dL — ABNORMAL HIGH (ref 70–99)

## 2018-05-06 LAB — MAGNESIUM: Magnesium: 1.2 mg/dL — ABNORMAL LOW (ref 1.7–2.4)

## 2018-05-06 LAB — CG4 I-STAT (LACTIC ACID): Lactic Acid, Venous: 1.75 mmol/L (ref 0.5–1.9)

## 2018-05-06 SURGERY — FASCIOTOMY CLOSURE
Anesthesia: General | Laterality: Left

## 2018-05-06 MED ORDER — PRO-STAT SUGAR FREE PO LIQD
30.0000 mL | Freq: Every day | ORAL | Status: DC
  Administered 2018-05-07 – 2018-05-09 (×2): 30 mL
  Filled 2018-05-06 (×3): qty 30

## 2018-05-06 MED ORDER — OXYCODONE HCL 5 MG/5ML PO SOLN
5.0000 mg | ORAL | Status: DC | PRN
  Administered 2018-05-06 – 2018-05-11 (×13): 5 mg via ORAL
  Filled 2018-05-06 (×13): qty 5

## 2018-05-06 MED ORDER — LACTATED RINGERS IV SOLN
INTRAVENOUS | Status: DC | PRN
  Administered 2018-05-06: 10:00:00 via INTRAVENOUS

## 2018-05-06 MED ORDER — ONDANSETRON HCL 4 MG/2ML IJ SOLN
INTRAMUSCULAR | Status: DC | PRN
  Administered 2018-05-06: 4 mg via INTRAVENOUS

## 2018-05-06 MED ORDER — 0.9 % SODIUM CHLORIDE (POUR BTL) OPTIME
TOPICAL | Status: DC | PRN
  Administered 2018-05-06: 1000 mL

## 2018-05-06 MED ORDER — MIDAZOLAM HCL 2 MG/2ML IJ SOLN
INTRAMUSCULAR | Status: AC
  Filled 2018-05-06: qty 2

## 2018-05-06 MED ORDER — LIDOCAINE 2% (20 MG/ML) 5 ML SYRINGE
INTRAMUSCULAR | Status: AC
  Filled 2018-05-06: qty 5

## 2018-05-06 MED ORDER — VITAL HIGH PROTEIN PO LIQD: 1000.0000 mL | ORAL | Status: DC

## 2018-05-06 MED ORDER — VITAL HIGH PROTEIN PO LIQD
1000.0000 mL | ORAL | Status: DC
  Administered 2018-05-06 – 2018-05-07 (×2): 1000 mL

## 2018-05-06 MED ORDER — EPHEDRINE 5 MG/ML INJ
INTRAVENOUS | Status: AC
  Filled 2018-05-06: qty 10

## 2018-05-06 MED ORDER — MIDAZOLAM HCL 5 MG/5ML IJ SOLN
INTRAMUSCULAR | Status: DC | PRN
  Administered 2018-05-06: 2 mg via INTRAVENOUS

## 2018-05-06 MED ORDER — ONDANSETRON HCL 4 MG/2ML IJ SOLN
INTRAMUSCULAR | Status: AC
  Filled 2018-05-06: qty 2

## 2018-05-06 MED ORDER — SODIUM CHLORIDE 0.9 % IV SOLN
1.0000 g | INTRAVENOUS | Status: AC
  Administered 2018-05-07 – 2018-05-09 (×3): 1 g via INTRAVENOUS
  Filled 2018-05-06 (×3): qty 1

## 2018-05-06 MED ORDER — FREE WATER
200.0000 mL | Freq: Three times a day (TID) | Status: DC
  Administered 2018-05-06 – 2018-05-09 (×7): 200 mL

## 2018-05-06 MED ORDER — EPHEDRINE SULFATE-NACL 50-0.9 MG/10ML-% IV SOSY
PREFILLED_SYRINGE | INTRAVENOUS | Status: DC | PRN
  Administered 2018-05-06: 10 mg via INTRAVENOUS
  Administered 2018-05-06: 5 mg via INTRAVENOUS

## 2018-05-06 MED ORDER — FENTANYL CITRATE (PF) 100 MCG/2ML IJ SOLN
INTRAMUSCULAR | Status: DC | PRN
  Administered 2018-05-06: 100 ug via INTRAVENOUS

## 2018-05-06 MED ORDER — PHENYLEPHRINE 40 MCG/ML (10ML) SYRINGE FOR IV PUSH (FOR BLOOD PRESSURE SUPPORT)
PREFILLED_SYRINGE | INTRAVENOUS | Status: DC | PRN
  Administered 2018-05-06 (×3): 120 ug via INTRAVENOUS

## 2018-05-06 MED ORDER — SODIUM CHLORIDE 0.9 % IV SOLN
INTRAVENOUS | Status: DC | PRN
  Administered 2018-05-06: 30 ug/min via INTRAVENOUS

## 2018-05-06 MED ORDER — SODIUM CHLORIDE 0.45 % IV SOLN
INTRAVENOUS | Status: DC
  Administered 2018-05-06 – 2018-05-08 (×2): via INTRAVENOUS

## 2018-05-06 MED ORDER — PRO-STAT SUGAR FREE PO LIQD: 30.0000 mL | Freq: Two times a day (BID) | ORAL | Status: DC

## 2018-05-06 MED ORDER — LIDOCAINE 2% (20 MG/ML) 5 ML SYRINGE
INTRAMUSCULAR | Status: DC | PRN
  Administered 2018-05-06: 60 mg via INTRAVENOUS

## 2018-05-06 MED ORDER — PHENYLEPHRINE 40 MCG/ML (10ML) SYRINGE FOR IV PUSH (FOR BLOOD PRESSURE SUPPORT)
PREFILLED_SYRINGE | INTRAVENOUS | Status: AC
  Filled 2018-05-06: qty 20

## 2018-05-06 MED ORDER — FENTANYL CITRATE (PF) 250 MCG/5ML IJ SOLN
INTRAMUSCULAR | Status: AC
  Filled 2018-05-06: qty 5

## 2018-05-06 MED ORDER — PROPOFOL 10 MG/ML IV BOLUS
INTRAVENOUS | Status: DC | PRN
  Administered 2018-05-06: 100 mg via INTRAVENOUS

## 2018-05-06 SURGICAL SUPPLY — 34 items
BAG ISL DRAPE 18X18 STRL (DRAPES) ×1
BAG ISOLATION DRAPE 18X18 (DRAPES) IMPLANT
CANISTER SUCT 3000ML PPV (MISCELLANEOUS) ×2 IMPLANT
COVER WAND RF STERILE (DRAPES) ×1 IMPLANT
DRAPE ISOLATION BAG 18X18 (DRAPES) ×1
DRAPE ORTHO SPLIT 77X108 STRL (DRAPES) ×2
DRAPE SURG ORHT 6 SPLT 77X108 (DRAPES) IMPLANT
DRSG COVADERM 4X14 (GAUZE/BANDAGES/DRESSINGS) ×2 IMPLANT
ELECT REM PT RETURN 9FT ADLT (ELECTROSURGICAL) ×2
ELECTRODE REM PT RTRN 9FT ADLT (ELECTROSURGICAL) ×1 IMPLANT
GAUZE SPONGE 4X4 12PLY STRL (GAUZE/BANDAGES/DRESSINGS) ×2 IMPLANT
GAUZE SPONGE 4X4 12PLY STRL LF (GAUZE/BANDAGES/DRESSINGS) ×1 IMPLANT
GLOVE BIO SURGEON STRL SZ7.5 (GLOVE) ×3 IMPLANT
GLOVE BIOGEL PI IND STRL 8 (GLOVE) ×1 IMPLANT
GLOVE BIOGEL PI INDICATOR 8 (GLOVE) ×1
GOWN STRL REUS W/ TWL LRG LVL3 (GOWN DISPOSABLE) ×3 IMPLANT
GOWN STRL REUS W/ TWL XL LVL3 (GOWN DISPOSABLE) ×2 IMPLANT
GOWN STRL REUS W/TWL LRG LVL3 (GOWN DISPOSABLE) ×2
GOWN STRL REUS W/TWL XL LVL3 (GOWN DISPOSABLE) ×4
KIT BASIN OR (CUSTOM PROCEDURE TRAY) ×2 IMPLANT
KIT TURNOVER KIT B (KITS) ×2 IMPLANT
NS IRRIG 1000ML POUR BTL (IV SOLUTION) ×2 IMPLANT
PACK GENERAL/GYN (CUSTOM PROCEDURE TRAY) ×2 IMPLANT
PACK UNIVERSAL I (CUSTOM PROCEDURE TRAY) ×2 IMPLANT
PAD ARMBOARD 7.5X6 YLW CONV (MISCELLANEOUS) ×3 IMPLANT
SUT ETHILON 2 0 PSLX (SUTURE) ×6 IMPLANT
SUT MNCRL AB 4-0 PS2 18 (SUTURE) IMPLANT
SUT SILK 3 0 (SUTURE) ×2
SUT SILK 3-0 18XBRD TIE 12 (SUTURE) IMPLANT
SUT VIC AB 2-0 CTX 36 (SUTURE) IMPLANT
SUT VIC AB 3-0 SH 27 (SUTURE)
SUT VIC AB 3-0 SH 27X BRD (SUTURE) IMPLANT
TOWEL GREEN STERILE (TOWEL DISPOSABLE) ×2 IMPLANT
WATER STERILE IRR 1000ML POUR (IV SOLUTION) ×2 IMPLANT

## 2018-05-06 NOTE — Progress Notes (Addendum)
[facility]  Subjective  - Trach/vent.  Remains in ICU.   Objective 102/68 92 99.6 F (37.6 C) (Axillary) (!) 28 100%  Intake/Output Summary (Last 24 hours) at 05/06/2018 1004 Last data filed at 05/06/2018 0900 Gross per 24 hour  Intake 2091.27 ml  Output 2810 ml  Net -718.73 ml   Left foot 2+ DP pulse  Assessment/Planning: To OR today for left leg fasciotomy closure   Marty Heck 05/06/2018 10:04 AM --  Laboratory Lab Results: Recent Labs    05/03/18 1456 05/03/18 1608  WBC  --  10.1  HGB 9.2* 9.1*  HCT 27.0* 32.5*  PLT  --  115*   BMET Recent Labs    05/04/18 1703 05/05/18 0552  NA 140 139  K 4.3 4.1  CL 113* 112*  CO2 20* 19*  GLUCOSE 147* 121*  BUN 44* 43*  CREATININE 4.90* 5.01*  CALCIUM 8.2* 8.3*    COAG Lab Results  Component Value Date   INR 1.13 08/12/2014   INR 0.96 11/09/2011   INR 0.95 08/30/2011   No results found for: PTT

## 2018-05-06 NOTE — Anesthesia Preprocedure Evaluation (Addendum)
Anesthesia Evaluation  Patient identified by MRN, date of birth, ID band Patient awake    Reviewed: Allergy & Precautions, NPO status , Patient's Chart, lab work & pertinent test results  Airway Mallampati: Intubated  TM Distance: >3 FB Neck ROM: Full    Dental   Pulmonary sleep apnea , Current Smoker,    Pulmonary exam normal        Cardiovascular hypertension, Pt. on medications Normal cardiovascular exam  Cardiomyopathy ECHO 06/2014 Study Conclusions  - Left ventricle: The cavity size was normal. Wall thickness was normal. Systolic function was normal. The estimated ejection fraction was in the range of 55% to 60%. Wall motion was normal; there were no regional wall motion abnormalities. Left ventricular diastolic function parameters were normal. - Ventricular septum: The contour showed diastolic flattening and systolic flattening. These changes are consistent with RV volume and pressure overload. - Left atrium: The atrium was mildly dilated. - Right ventricle: The cavity size was moderately dilated. - Right atrium: The atrium was mildly dilated. - Tricuspid valve: There was moderate regurgitation directed centrally. - Pulmonary arteries: Systolic pressure was severely increased. PA peak pressure: 83 mm Hg (S).   Neuro/Psych    GI/Hepatic GERD  Medicated and Controlled,  Endo/Other  diabetes, Type 2, Insulin Dependent  Renal/GU Renal InsufficiencyRenal disease     Musculoskeletal   Abdominal   Peds  Hematology   Anesthesia Other Findings   Reproductive/Obstetrics                            Anesthesia Physical Anesthesia Plan  ASA: III  Anesthesia Plan: General   Post-op Pain Management:    Induction: Intravenous  PONV Risk Score and Plan: 2 and Treatment may vary due to age or medical condition  Airway Management Planned: Oral ETT  Additional Equipment:    Intra-op Plan:   Post-operative Plan: Post-operative intubation/ventilation  Informed Consent: I have reviewed the patients History and Physical, chart, labs and discussed the procedure including the risks, benefits and alternatives for the proposed anesthesia with the patient or authorized representative who has indicated his/her understanding and acceptance.     Plan Discussed with: Surgeon and CRNA  Anesthesia Plan Comments:         Anesthesia Quick Evaluation

## 2018-05-06 NOTE — Transfer of Care (Signed)
Immediate Anesthesia Transfer of Care Note  Patient: Melody Gray  Procedure(s) Performed: FASCIOTOMY CLOSURE OF LEFT LEG (Left )  Patient Location: PACU and ICU  Anesthesia Type:General  Level of Consciousness: drowsy and Patient remains intubated per anesthesia plan  Airway & Oxygen Therapy: Patient remains intubated per anesthesia plan  Post-op Assessment: Report given to RN and Post -op Vital signs reviewed and stable  Post vital signs: Reviewed and stable  Last Vitals:  Vitals Value Taken Time  BP 124/59 05/06/2018 11:33 AM  Temp    Pulse 94 05/06/2018 11:34 AM  Resp 28 05/06/2018 11:34 AM  SpO2 93 % 05/06/2018 11:34 AM  Vitals shown include unvalidated device data.  Last Pain:  Vitals:   05/06/18 0800  TempSrc: Axillary  PainSc: 2       Patients Stated Pain Goal: 3 (44/03/47 4259)  Complications: No apparent anesthesia complications

## 2018-05-06 NOTE — Consult Note (Signed)
[facility] for Infectious Disease    Date of Admission:  04/30/2018     Total days of antibiotics 4               Reason for Consult: MRSA bacteremia  Referring Provider: CHAMP / [place] Primary Care Provider: Neva Seat, MD   Assessment/Plan:  [name] is a 48 year old female with MRSA bacteremia and pneumonia status post PEA arrest and complicated by left lower leg compartment syndrome. She remains intubated at present and clinically appears to be improving. She has been afebrile and repeat cultures have been drawn today. Source of infection may be pneumonia. Will need TTE and possible TEE to rule out endocarditis. Plan to continue current dose of linezolid for pneumonia and will likely change to daptomycin for prolonged treatment of bacteremia. Duration depending on testing results.  1. Continue current dose of linezolid. 2. Monitor fever curve, WBC count and repeat cultures. 3. AKI per primary team. 4. Compartment syndrome per vascular surgery.    Principal Problem:   Hyperkalemia Active Problems:   Lesion of liver and cirrhosis on imaging   Insulin-requiring or dependent type II diabetes mellitus (HCC)   Hypertension complicating diabetes ([place])   Acute on chronic respiratory failure with hypoxemia (HCC)   Acute kidney injury (AKI) with acute tubular necrosis (ATN) (HCC)   Acute renal failure superimposed on stage 4 chronic kidney disease (HCC)   Chronic respiratory failure with hypoxia (HCC)/ trach dep   Chronic pulmonary hypertension (HCC)   Cardiac arrest (HCC)   Nontraumatic compartment syndrome of left lower extremity   . chlorhexidine gluconate (MEDLINE KIT)  15 mL Mouth Rinse BID  . fluticasone  1 spray Each Nare Daily  . free water  200 mL Per Tube Q8H  . insulin aspart  0-20 Units Subcutaneous Q4H  . insulin detemir  20 Units Subcutaneous QHS  . mouth rinse  15 mL Mouth Rinse 10 times per day  . sodium chloride flush  3 mL Intravenous  Q12H     HPI: Melody Gray is a 48 y.o. female with previous medical history of CHF, diabetes, hypertension, obesity hypoventilation syndrome and tracheostomy who was admitted with hyperkalemia, worsening shortness of breath and cough, and acute kidney injury. She developed developed PEA arrest on May 26, 2018. She was coded for approximately 40 minutes prior to return of spontaneous circulation. This was complicated by development of left leg compartment syndrome in the left lower leg requiring emergent fasciotomy.    She was initially febrile on 1/1 with a max temperature of 100.6 and then an elevated temperature on 1/5. WBC count has remained in the normal ranges. CT imaging of the abdomen on 05/26/17 with left lung base opacification consistent with large pneumonia. There was also opacity of the right middle lobe. Most recent chest x-ray with improved aeration of the left lung and persistent densities or consolidation at the left lung base. Blood cultures from 1/4 were positive for MRSA in 1/4 bottles. Currently on Day 4 of antimicrobial therapy with Linezolid having previously completed 3 days of cefepime.    Review of Systems: Review of Systems  Constitutional: Negative for chills and fever.  Respiratory: Negative for cough, shortness of breath and wheezing.   Cardiovascular: Positive for leg swelling. Negative for chest pain.  Gastrointestinal: Negative for constipation, diarrhea, nausea and vomiting.  Skin: Negative for rash.  Neurological: Negative for weakness and headaches.     Past Medical History:  Diagnosis Date  .  Abnormal LFTs (liver function tests)    Liver U/S and exam c/w HSM. Hep B serology neg. but Hep C ab +, HIV neg. 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 Anemia   . Cardiomyopathy, nonischemic ([place])    a. Varying EF over the years - initially 35% in 09/2010. Normal cors 12/2010. b. Echo 07/2014: EF 55-60%, no RWMA, +  diastolic flattening and systolic flattening c/w RV volume and pressure overload, mild LAE/RAE, mod dilated RV, mod TR, PASP severely increased at 50mHg. c. RHC 07/2014: moderate pulmonary HTN likely WHO group 3 with markedly elevated CVP and relatively normal left sided pressures.  . Chronic diastolic CHF (congestive heart failure) ([place]   . Chronic respiratory failure ([place]    a. became O2 dependent in July 2011. b. She required trache placement in 07/2013 and has been followed by pulmonology as well.  . CKD (chronic kidney disease), stage II   . Complication of anesthesia    " difficult waking "  . Diabetes mellitus    insulin dependent  . Diabetic retinopathy    Right eye 2/11  . Essential hypertension   . Health maintenance examination    Mammogram 05/2010 Negative; Last Pap smear 03/2008; Last DM eye exam 2/11> mild non-proliferative diabetic retinopathy. OD  . Helicobacter pylori ab+ 05/2011   Pt was symptomatic and treatment planned for 05/2011  . Hx of cardiac cath 2/08   No CAD, no RAS,  normal EF  . Hypoxemia    CT angiogram 9/11>> No PE; PFTs 10/11- FEV1 1.20 (49%) with 16% better p B2, DLCO 33%> corrects to 84; O2 sats ok on 4 lpm X rapid walk X 3 laps 05/2010  . Long term current use of systemic steroids   . Morbid obesity ([facility] [place]    Target wt= 153 for BMI <30  . Obesity hypoventilation syndrome ([facility]   . Pulmonary hypertension ([facility]   . QT prolongation   . Sarcoidosis    Followed by Dr. WMelvyn Novas w/ liver involvement per biopsy 12/09, Reversible airway component [facility]; HFA 75% p coaching 05/2010  . Seborrheic dermatitis of scalp   . Sleep apnea   . Tobacco abuse     Social History   Tobacco Use  . Smoking status: Current Some Day Smoker    Packs/day: 0.33    Years: 20.00    Pack years: 6.60    Types: Cigarettes    Start date: 10/05/2014  . Smokeless tobacco: Never Used  . Tobacco comment: 1 pack every 3 days  Substance Use Topics  . Alcohol use: No     Alcohol/week: 0.0 standard drinks  . Drug use: No    Family History  Problem Relation Age of Onset  . Cancer Mother        colon cancer  . Multiple sclerosis Father   . Diabetes Father   . Asthma Sister        in childhood  . Hypertension Other     Allergies  Allergen Reactions  . Vicodin [Hydrocodone-Acetaminophen] Itching    OBJECTIVE: Blood pressure 120/70, pulse 100, temperature 98.7 F (37.1 C), temperature source Oral, resp. rate (!) 28, height '5\' 1"'  (1.549 m), weight 106.9 kg, SpO2 94 %.  Physical Exam Constitutional:      General: She is not in acute distress.    Appearance: She is well-developed.  Cardiovascular:     Rate and Rhythm: Normal  rate and regular rhythm.     Heart sounds: Normal heart sounds. No murmur.  Pulmonary:     Effort: Pulmonary effort is normal.     Breath sounds: Normal breath sounds.  Skin:    General: Skin is warm and dry.     Comments: Surgical dressing are clean and dry. Distal pulses are intact and appropriate.   Neurological:     Mental Status: She is alert and oriented to person, place, and time.  Psychiatric:        Behavior: Behavior normal.        Thought Content: Thought content normal.        Judgment: Judgment normal.     Lab Results Lab Results  Component Value Date   WBC 10.1 05/03/2018   HGB 9.1 (L) 05/03/2018   HCT 32.5 (L) 05/03/2018   MCV 104.8 (H) 05/03/2018   PLT 115 (L) 05/03/2018    Lab Results  Component Value Date   CREATININE 4.64 (H) 05/06/2018   BUN 42 (H) 05/06/2018   NA 140 05/06/2018   K 4.2 05/06/2018   CL 115 (H) 05/06/2018   CO2 18 (L) 05/06/2018    Lab Results  Component Value Date   ALT 82 (H) 05/03/2018   AST 110 (H) 05/03/2018   ALKPHOS 224 (H) 05/03/2018   BILITOT 0.6 05/03/2018     Microbiology: Recent Results (from the past 240 hour(s))  MRSA PCR Screening     Status: None   Collection Time: 04/30/18 11:34 PM  Result Value Ref Range Status   MRSA by PCR NEGATIVE  NEGATIVE Final    Comment:        The GeneXpert MRSA Assay (FDA approved for NASAL specimens only), is one component of a comprehensive MRSA colonization surveillance program. It is not intended to diagnose MRSA infection nor to guide or monitor treatment for MRSA infections. Performed at [facility], [place] [address], [place], [place] [address]   Culture, blood (routine x 2)     Status: Abnormal   Collection Time: 05/03/18  5:25 PM  Result Value Ref Range Status   Specimen Description BLOOD RIGHT ARM  Final   Special Requests   Final    BOTTLES DRAWN AEROBIC ONLY Blood Culture adequate volume Performed at [facility] [address] [place], [place] [address]    Culture  Setup Time   Final    GRAM POSITIVE COCCI AEROBIC BOTTLE ONLY CRITICAL RESULT CALLED TO, READ BACK BY AND VERIFIED WITH: M [place] [phone] BY GF    Culture METHICILLIN RESISTANT STAPHYLOCOCCUS AUREUS (A)  Final   Report Status 05/06/2018 FINAL  Final   Organism ID, Bacteria METHICILLIN RESISTANT STAPHYLOCOCCUS AUREUS  Final      Susceptibility   Methicillin resistant staphylococcus aureus - MIC*    CIPROFLOXACIN <=0.5 SENSITIVE Sensitive     ERYTHROMYCIN >=8 RESISTANT Resistant     GENTAMICIN <=0.5 SENSITIVE Sensitive     OXACILLIN >=4 RESISTANT Resistant     TETRACYCLINE <=1 SENSITIVE Sensitive     VANCOMYCIN <=0.5 SENSITIVE Sensitive     TRIMETH/SULFA <=10 SENSITIVE Sensitive     CLINDAMYCIN <=0.25 SENSITIVE Sensitive     RIFAMPIN <=0.5 SENSITIVE Sensitive     Inducible Clindamycin NEGATIVE Sensitive     * METHICILLIN RESISTANT STAPHYLOCOCCUS AUREUS  Blood Culture ID Panel (Reflexed)     Status: Abnormal   Collection Time: 05/03/18  5:25 PM  Result Value Ref Range Status  Enterococcus species NOT DETECTED NOT DETECTED Final   Listeria monocytogenes NOT DETECTED NOT DETECTED Final   Staphylococcus species (A) NOT DETECTED Final    CRITICAL RESULT  CALLED TO, READ BACK BY AND VERIFIED WITH:    Comment: Vernelle Emerald [phone] BY GF   Staphylococcus aureus (BCID) (A) NOT DETECTED Final    CRITICAL RESULT CALLED TO, READ BACK BY AND VERIFIED WITH:    CommentVernelle Emerald PHARMD [phone] BY GF   Methicillin resistance DETECTED (A) NOT DETECTED Final    Comment: CRITICAL RESULT CALLED TO, READ BACK BY AND VERIFIED WITH: Arsenio Loader PHARMD [phone] BY GF    Streptococcus species NOT DETECTED NOT DETECTED Final   Streptococcus agalactiae NOT DETECTED NOT DETECTED Final   Streptococcus pneumoniae NOT DETECTED NOT DETECTED Final   Streptococcus pyogenes NOT DETECTED NOT DETECTED Final   Acinetobacter baumannii NOT DETECTED NOT DETECTED Final   Enterobacteriaceae species NOT DETECTED NOT DETECTED Final   Enterobacter cloacae complex NOT DETECTED NOT DETECTED Final   Escherichia coli NOT DETECTED NOT DETECTED Final   Klebsiella oxytoca NOT DETECTED NOT DETECTED Final   Klebsiella pneumoniae NOT DETECTED NOT DETECTED Final   Proteus species NOT DETECTED NOT DETECTED Final   Serratia marcescens NOT DETECTED NOT DETECTED Final   Haemophilus influenzae NOT DETECTED NOT DETECTED Final   Neisseria meningitidis NOT DETECTED NOT DETECTED Final   Pseudomonas aeruginosa NOT DETECTED NOT DETECTED Final   Candida albicans NOT DETECTED NOT DETECTED Final   Candida glabrata NOT DETECTED NOT DETECTED Final   Candida krusei NOT DETECTED NOT DETECTED Final   Candida parapsilosis NOT DETECTED NOT DETECTED Final   Candida tropicalis NOT DETECTED NOT DETECTED Final    Comment: Performed at [facility], [place]. [address] [place], [place] [address]  Culture, blood (routine x 2)     Status: None (Preliminary result)   Collection Time: 05/03/18  6:02 PM  Result Value Ref Range Status   Specimen Description   Final    BLOOD LEFT HAND Performed at [facility] [address] [place], [place] [address]    Special Requests   Final      BOTTLES DRAWN AEROBIC AND ANAEROBIC Blood Culture adequate volume Performed at [facility], [place] [address], [place], [place] [address]    Culture NO GROWTH 2 DAYS  Final   Report Status PENDING  Incomplete  Culture, Urine     Status: None   Collection Time: 05/03/18  6:29 PM  Result Value Ref Range Status   Specimen Description   Final    URINE, CATHETERIZED Performed at [facility] [address] [place], [place] [address]    Special Requests   Final    NONE Performed at [facility], [place] [address] [place], [place] [address]    Culture   Final    NO GROWTH Performed at [facility], [place] [address], [place], [place] [address]    Report Status 05/05/2018 FINAL  Final  Culture, respiratory     Status: None   Collection Time: 05/04/18  1:50 AM  Result Value Ref Range Status   Specimen Description TRACHEAL ASPIRATE  Final   Special Requests NONE  Final   Gram Stain   Final    ABUNDANT WBC PRESENT, PREDOMINANTLY PMN NO ORGANISMS SEEN    Culture   Final    Consistent with normal respiratory flora. Performed at [facility],  [address], [place], [place] [address]    Report Status 05/06/2018 FINAL  Final     Terri Piedra, NP [place] [phone] Pager  05/06/2018  1:54 PM

## 2018-05-06 NOTE — Progress Notes (Signed)
RT NOTES: 1600 CPT held d/t patient recently coming back from OR.

## 2018-05-06 NOTE — Progress Notes (Signed)
Pharmacy Antibiotic Note  Melody Gray is a 48 y.o. female admitted on 04/30/2018 with cough, SOB, extensive PMH including sarcoidosis with pulmonary and hepatic involvement, and chronic respiratory failure with tracheostomy.  She was found to have pneumonia on CT on 1/3, also had Code blue with CPR and likely aspiration.  Pharmacy has been consulted for Cefepime dosing.  SCr 4.5 this morning, 3.6 now with CrCl ~ 20 ml/min  Plan: Adjust Cefepime to 1g IV every 24 hours.  Follow up renal fxn, culture results, and clinical course.   Height: 5\' 1"  (154.9 cm) Weight: 235 lb 10.8 oz (106.9 kg) IBW/kg (Calculated) : 47.8  Temp (24hrs), Avg:99.3 F (37.4 C), Min:98.7 F (37.1 C), Max:100 F (37.8 C)  Recent Labs  Lab 04/30/18 1902  05/01/18 0538 05/02/18 0815  05/03/18 1452 05/03/18 1453  05/03/18 1608 05/04/18 0429 05/04/18 1703 05/05/18 0552 05/06/18 0626  WBC 4.0  --  4.7 9.1  --   --   --   --  10.1  --   --   --   --   CREATININE 3.23*   < > 3.22* 3.22*   < >  --   --    < > 4.67* 4.88* 4.90* 5.01* 4.64*  LATICACIDVEN  --   --   --   --   --  1.72 1.75  --  1.2  --   --   --   --    < > = values in this interval not displayed.    Estimated Creatinine Clearance: 16.5 mL/min (A) (by C-G formula based on SCr of 4.64 mg/dL (H)).    Allergies  Allergen Reactions  . Vicodin [Hydrocodone-Acetaminophen] Itching    Antimicrobials this admission:  1/3 Cefepime >>  Dose adjustments this admission:   Microbiology results:  12/31 MRSA PCR: negative  Thank you for allowing pharmacy to be a part of this patient's care.  Brain Hilts 05/06/2018 10:09 PM

## 2018-05-06 NOTE — Consult Note (Signed)
[place] Nurse wound consult note Reason for Consult:Fasciotomy to left leg.  NPWT (VAC) in place.  Back to OR for planned closure today.  Will see tomorrow to assess for ongoing [place] needs. No intervention from [facility] team needed at this time.  [facility] team will monitor and follow along with vascular services as needed.  Domenic Moras MSN, RN, FNP-BC [facility] Wound, Ostomy, Continence Nurse Pager [phone]

## 2018-05-06 NOTE — Progress Notes (Signed)
RT NOTES: 1200 CPT held at this time d/t patient just returned from OR.

## 2018-05-06 NOTE — Anesthesia Postprocedure Evaluation (Signed)
Anesthesia Post Note  Patient: Melody Gray  Procedure(s) Performed: FASCIOTOMY CLOSURE OF LEFT LEG (Left )     Patient location during evaluation: SICU Anesthesia Type: General Level of consciousness: sedated Pain management: pain level controlled Vital Signs Assessment: post-procedure vital signs reviewed and stable Respiratory status: patient remains intubated per anesthesia plan Cardiovascular status: stable Postop Assessment: no apparent nausea or vomiting Anesthetic complications: no    Last Vitals:  Vitals:   05/06/18 1134 05/06/18 1200  BP:    Pulse: 94 100  Resp: (!) 28   Temp:  37.1 C  SpO2: 94% 94%    Last Pain:  Vitals:   05/06/18 1242  TempSrc:   PainSc: Asleep                 Rathana Viveros DAVID

## 2018-05-06 NOTE — Progress Notes (Signed)
Nutrition Follow-up  DOCUMENTATION CODES:   Morbid obesity  INTERVENTION:   Initiate Vital High Protein @ 50 ml/hr (1200 ml/day) via NG tube 30 ml Prostat daily  Provides: 1300 kcal, 120 grams protein, and 1003 ml free water.  Total free water: 1603 ml    NUTRITION DIAGNOSIS:   Inadequate oral intake related to inability to eat as evidenced by NPO status. Ongoing.   GOAL:   Patient will meet greater than or equal to 90% of their needs Progressing.   MONITOR:   Vent status, Skin, Weight trends, I & O's, Labs  REASON FOR ASSESSMENT:   Consult Enteral/tube feeding initiation and management  ASSESSMENT:   48 y.o., female with chronic trach, cirrhosis, CHF admitted on 04/30/2018 for acute renal failure, suffered cardiac arrest, PEA, with ROSC after ~40 mins, neuro intact after arrest, left LLE IO line, acute LLE compartment syndrome taken for fasciotomy.   1/3: Procedure:FOUR COMPARTMENT FASCIOTOMY, LEFT LOWER LEG (Left)  1/6 TF started; abd distended  Patient is currently intubated on ventilator support Temp (24hrs), Avg:99.1 F (37.3 C), Min:98.7 F (37.1 C), Max:100 F (37.8 C)  Propofol: none  Medications reviewed and include: SSI  200 ml free water every 8 hours = 600 ml Labs reviewed + 5 L since admission; weight elevated from admission  Diet Order:   Diet Order            Diet NPO time specified Except for: Sips with Meds  Diet effective midnight              EDUCATION NEEDS:   Not appropriate for education at this time  Skin:  Skin Assessment: Skin Integrity Issues: Skin Integrity Issues:: Incisions Incisions: L leg  Last BM:  1/2  Height:   Ht Readings from Last 1 Encounters:  05/05/18 5\' 1"  (1.549 m)    Weight:   Wt Readings from Last 1 Encounters:  05/06/18 106.9 kg    Ideal Body Weight:  47.7 kg  BMI:  Body mass index is 44.53 kg/m.  Estimated Nutritional Needs:   Kcal:  7035-0093  Protein:  119-129 grams  Fluid:   >/= 1.5 L/day  Maylon Peppers RD, LDN, CNSC [phone] Pager [phone] After Hours Pager

## 2018-05-06 NOTE — Progress Notes (Signed)
CPT will be started at 0000 rounds to get on to correct time schedule of every 4 hours.

## 2018-05-06 NOTE — Op Note (Signed)
Date: May 06, 2018  Preoperative diagnosis: Left lower extremity 4 compartment fasciotomies  Postoperative diagnosis: Same  Procedure: Closure of left lower extremity medial and lateral fasciotomy incisions  Surgeon: Dr. Marty Heck, MD  Assistant: Arlee Muslim, PA  Indications: Patient is a 48 year old female who presented with compartment syndrome in her left leg on Friday night and underwent left lower extremity fasciotomies by Dr. Oneida Alar.  Reportedly this occurred after infiltration of an intraosseous IV in the left leg after a code event.  She presents for fasciotomy closure after risks and benefits were discussed.  Findings: Left leg muscle viable and healthy-appearing.  Anesthesia: General  Details: Patient was taken to the operating room from the ICU with anesthesia transport.  She was placed on operative table in supine position.  Her left leg was prepped and draped in usual sterile fashion after a preop timeout was performed to identify patient, procedure, and site.  Initially used sterile saline and irrigated both the medial and lateral leg incisions and the underlying muscle was irrigated as well.  All the underlying muscle appeared viable.  We then used 2-0 nylon with a horizontal mattress to close initially the medial and then lateral incision.  We elected to use suture instead of staples given that there was some tension in order to get the skin adequately closed.  Patient had a nice palpable pulse in the left foot at completion of the case.  We then placed sterile 4 x 4 gauze and sterile Primapore dressing on both the medial and lateral incision.  She will be taken back to her room in stable condition.  Condition: Stable  Complications: None  Marty Heck, MD [facility] Office: [phone] Pager: [place]

## 2018-05-06 NOTE — Progress Notes (Addendum)
NAME:  Melody Gray, MRN:  [id number], DOB:  10-01-1970, LOS: 5 ADMISSION DATE:  04/30/2018, CONSULTATION DATE:  05/03/18 REFERRING MD:  Dr. Valeta Harms, CHIEF COMPLAINT:  Cardiac arrest    Brief History   48 y/o F with sarcoidosis & chronic tracheostomy in the setting of OHS admitted 12/31 with AKI and hyperkalemia.  Developed PEA arrest after admit requiring CPR ~ 40 minutes.  She required an emergent trach change during the arrest for ventilation which was unsuccessful.  ETT intubation was unsuccessful as well.  ENT placed an ETT via her tracheostomy stoma.  She was then found to have a pulseless left extremity and was placed on anticoagulation.  Tx to [facility] for VVS evaluation.    Past Medical History  Sarcoidosis - lung, liver involvement; OSA/OHS s/p trach  Pulmonary HTN - pa peak 83 on ECHO 07/2014; HTN; DM  CKD II; NICM - LVEF 55-60% in 07/2014   Significant Hospital Events   12/31  Admit with AKI, hyperkalemia  01/03  PEA arrest, 40 min before ROSC.  DIFFICULT AIRWAY.  01/04  ENT created new trach tube out of #7 ETT  1/6 back to OR for closure of LLE. Hemodynamics improved.  Consults:  VVS  Procedures:  L I-O 1/3 >> 1/3  ALine 1/3 >>  TLC 1/3 >>  ETT via Stoma 1/3 >>   Significant Diagnostic Tests:  CT ABD/Pelvis 1/3 >> large LLL PNA, probable RML as well, hepatic cirrhosis, stable 3.1 cm low density in midpole of left kidney  Micro Data:  BCx2 1/3 >> 1/2 with GPC's >> MRSA  Tracheal aspirate 1/4 >>  UC 1/3 >>   Antimicrobials:  Levaquin 1/3 >> 1/3  Cefepime 1/3 >>  linezolid 1/3>>>  Interim history/subjective:  No distress.  Awaiting surgery. Objective   Blood pressure 102/68, pulse 92, temperature 99.6 F (37.6 C), temperature source Axillary, resp. rate (Abnormal) 28, height 5\' 1"  (1.549 m), weight 106.9 kg, SpO2 100 %.    Vent Mode: PRVC FiO2 (%):  [40 %] 40 % Set Rate:  [28 bmp] 28 bmp Vt Set:  [420 mL] 420 mL PEEP:  [8 cmH20-10 cmH20] 10 cmH20 Pressure  Support:  [18 cmH20] 18 cmH20 Plateau Pressure:  [22 cmH20-32 cmH20] 22 cmH20   Intake/Output Summary (Last 24 hours) at 05/06/2018 1034 Last data filed at 05/06/2018 0900 Gross per 24 hour  Intake 2091.27 ml  Output 2810 ml  Net -718.73 ml   Filed Weights   05/04/18 0331 05/05/18 0637 05/06/18 0600  Weight: 105.5 kg 107.5 kg 106.9 kg    Examination:  General: Pleasant 48 year old female currently on full ventilator support she is in no acute distress.  She is awake, interactive.  She is at baseline cognitive status. HEENT: Oral endotracheal tube currently in tracheostomy stoma neck and face edematous sclera nonicteric Pulmonary: Clear, diminished bases Cardiac: Regular rate and rhythm Abdomen: Soft nontender no organomegaly Extremities: Warm, left leg wrapped.  CMS appears intact. Neuro: Awake, oriented, interactive.  She is at baseline.  Resolved Hospital Problem list     PEA arrest 1/3 Cardiogenic shock  Assessment & Plan:   Acute on chronic hypoxic respiratory failure in the setting of volume overload, and acute on chronic renal failure.  Superimposed on underlying advanced sarcoidosis, and sleep apnea -She has been tracheostomy dependent for several years secondary to difficult airway and prior traumatic intubation -Portable chest x-ray personally reviewed:Marked cardiomegaly.  Endotracheal tubes in satisfactory position through tracheostomy stoma.  There is marked  improvement in left side aeration, persistent right sided airspace disease.  Suspect element of volume overload superimposed on pneumonia Plan Continuing full ventilator support Daily assessment for weaning VAP bundle Assess for diuresis once hemodynamically stabilized  Probable aspiration pneumonia versus hcap -Chest x-ray improved some on left -Negative cultures to date Plan  Day 4 cefepime and day 3 linezolid   Chronic trach dependence  Plan Will eventually need either soft portex vs upsize and trach in  OR  MRSA bacteremia (1 of 3 bottles) Plan Repeat BCs Cont abx (zyvox)   Left Leg Compartment Syndrome s/p IO device  P: Back to the OR 1/6 for closure  Pulmonary HTN -in setting of sarcoidosis  P: Supportive care   Acute on chronic renal failure w/ worsening NAG metabolic acidosis -renal fxn better Chloride increased P Dc NS and change to 1/2NS Trend renal fxn Avoid hypotension stricy I&O Renal dose meds  Hepatic Cirrhosis prob 2/2 chronic PH P: Supportive care   Best practice:  Diet: NPO Pain/Anxiety/Delirium protocol (if indicated): Fentanyl  VAP protocol (if indicated): in place  DVT prophylaxis: SCD on RLE  GI prophylaxis: pepcid  Glucose control: SSI Mobility: bed rest  Code Status: Full code  Family Communication: Sister Justice Rocher, works in food services at [employer]) updated at bedside 1/4.family updated at bedside   Critical care time: 4min    Aayliah Rotenberry E Browning [place] ACNP-BC [place] Pager # [phone] OR # [phone] if no answer

## 2018-05-06 NOTE — Progress Notes (Signed)
[facility]    NEPHROLOGY PROGRESS NOTE  SUBJECTIVE: No complaints this morning.  Just returned from the OR.  Denies chest pain, shortness of breath, nausea, vomiting, diarrhea or dysuria.  Urine output is improving.  All other review of systems are negative.  Is no longer on pressors.     OBJECTIVE:  Vitals:   05/06/18 1134 05/06/18 1200  BP:    Pulse: 94 100  Resp: (!) 28   Temp:  98.7 F (37.1 C)  SpO2: 94% 94%   I/O last 3 completed shifts: In: 4067.3 [I.V.:3205.1; IV Piggyback:862.2] Out: 6834 [Urine:2490; Emesis/NG output:900]  Blood pressure 120/70, pulse 100, temperature 98.7 F (37.1 C), temperature source Oral, resp. rate (!) 28, height _0  (1.549 m), weight 106.9 kg, SpO2 94 %.    Genearl:  AAOx3 NAD HEENT: MMM [place] AT anicteric sclera flaking skin noted Neck:  No JVD, no adenopathy positive trach CV:  Heart RRR  Lungs:  L/S CTA bilaterally decreased breath sounds at the bases  abd: Mild distention, nontender with normal BS GU:  Bladder non-palpable Extremities: +1 bilateral lower extremity edema Skin: Normal turgor  MEDICATIONS:   Current Facility-Administered Medications:  .  0.45 % sodium chloride infusion, , Intravenous, Continuous, [name], NP, Last Rate: 50 mL/hr at 05/06/18 1203 .  0.9 %  sodium chloride infusion, 250 mL, Intravenous, Continuous, Fields, Jessy Oto, MD, Stopped at 05/03/18 2251 .  acetaminophen (TYLENOL) tablet 650 mg, 650 mg, Oral, Q6H PRN **OR** acetaminophen (TYLENOL) suppository 650 mg, 650 mg, Rectal, Q6H PRN, Fields, Charles E, MD .  albuterol (PROVENTIL) (2.5 MG/3ML) 0.083% nebulizer solution 2.5 mg, 2.5 mg, Nebulization, Q2H PRN, [name], MD, 2.5 mg at 05/05/18 1951 .  ceFEPIme (MAXIPIME) 1 g in sodium chloride 0.9 % 100 mL IVPB, 1 g, Intravenous, Q12H, Fields, Jessy Oto, MD, Stopped at 05/06/18 0630 .  chlorhexidine gluconate (MEDLINE KIT) (PERIDEX) 0.12 % solution 15 mL, 15 mL, Mouth Rinse,  BID, Aljishi, Virgina Norfolk, MD, 15 mL at 05/06/18 0846 .  docusate (COLACE) 50 MG/5ML liquid 100 mg, 100 mg, Per Tube, BID PRN, [name], MD .  famotidine (PEPCID) IVPB 20 mg premix, 20 mg, Intravenous, Q24H, Icard, Bradley L, DO, Stopped at 05/05/18 1448 .  fentaNYL (SUBLIMAZE) injection 50 mcg, 50 mcg, Intravenous, Q1H PRN, [name], NP, 50 mcg at 05/06/18 1212 .  fluticasone (FLONASE) 50 MCG/ACT nasal spray 1 spray, 1 spray, Each Nare, Daily, [name], MD, 1 spray at 05/03/18 0940 .  free water 200 mL, 200 mL, Per Tube, Q8H, Babcock, Peter E, NP .  insulin aspart (novoLOG) injection 0-20 Units, 0-20 Units, Subcutaneous, Q4H, Ollis, Brandi L, NP, 4 Units at 05/05/18 2357 .  insulin detemir (LEVEMIR) injection 20 Units, 20 Units, Subcutaneous, QHS, Ollis, Brandi L, NP, 20 Units at 05/05/18 2200 .  linezolid (ZYVOX) IVPB 600 mg, 600 mg, Intravenous, Q12H, Ollis, Brandi L, NP, Last Rate: 300 mL/hr at 05/06/18 1205, 600 mg at 05/06/18 1205 .  MEDLINE mouth rinse, 15 mL, Mouth Rinse, 10 times per day, Aldean Jewett, MD, 15 mL at 05/06/18 1208 .  ondansetron (ZOFRAN) tablet 4 mg, 4 mg, Oral, Q6H PRN **OR** ondansetron (ZOFRAN) injection 4 mg, 4 mg, Intravenous, Q6H PRN, Fields, Charles E, MD .  sodium chloride flush (NS) 0.9 % injection 3 mL, 3 mL, Intravenous, Q12H, Fields, Jessy Oto, MD, 3 mL at 05/05/18 1040     LABS:  CBC Latest Ref  Rng & Units 05/03/2018 05/03/2018 05/02/2018  WBC 4.0 - 10.5 K/uL 10.1 - 9.1  Hemoglobin 12.0 - 15.0 g/dL 9.1(L) 9.2(L) 9.9(L)  Hematocrit 36.0 - 46.0 % 32.5(L) 27.0(L) 35.2(L)  Platelets 150 - 400 K/uL 115(L) - 148(L)    CMP Latest Ref Rng & Units 05/06/2018 05/05/2018 05/04/2018  Glucose 70 - 99 mg/dL 103(H) 121(H) 147(H)  BUN 6 - 20 mg/dL 42(H) 43(H) 44(H)  Creatinine 0.44 - 1.00 mg/dL 4.64(H) 5.01(H) 4.90(H)  Sodium 135 - 145 mmol/L 140 139 140  Potassium 3.5 - 5.1 mmol/L 4.2 4.1 4.3  Chloride 98 - 111 mmol/L 115(H) 112(H) 113(H)  CO2 22 - 32  mmol/L 18(L) 19(L) 20(L)  Calcium 8.9 - 10.3 mg/dL 8.3(L) 8.3(L) 8.2(L)  Total Protein 6.5 - 8.1 g/dL - - -  Total Bilirubin 0.3 - 1.2 mg/dL - - -  Alkaline Phos 38 - 126 U/L - - -  AST 15 - 41 U/L - - -  ALT 0 - 44 U/L - - -    Lab Results  Component Value Date   PTH 14.9 07/28/2010   CALCIUM 8.3 (L) 05/06/2018   CAION 1.30 05/03/2018   PHOS 6.2 (H) 05/03/2018       Component Value Date/Time   COLORURINE YELLOW 04/30/2018 2221   APPEARANCEUR HAZY (A) 04/30/2018 2221   APPEARANCEUR Cloudy (A) 01/22/2018 1000   LABSPEC 1.010 04/30/2018 2221   PHURINE 5.0 04/30/2018 2221   GLUCOSEU 50 (A) 04/30/2018 2221   GLUCOSEU NEG mg/dL 09/29/2009 1450   HGBUR SMALL (A) 04/30/2018 2221   BILIRUBINUR NEGATIVE 04/30/2018 2221   BILIRUBINUR Negative 01/22/2018 1000   KETONESUR NEGATIVE 04/30/2018 2221   PROTEINUR >=300 (A) 04/30/2018 2221   UROBILINOGEN 0.2 12/27/2015 1039   NITRITE NEGATIVE 04/30/2018 2221   LEUKOCYTESUR NEGATIVE 04/30/2018 2221   LEUKOCYTESUR Negative 01/22/2018 1000      Component Value Date/Time   PHART 7.321 (L) 05/04/2018 0355   PCO2ART 41.2 05/04/2018 0355   PO2ART 63.6 (L) 05/04/2018 0355   HCO3 20.7 05/04/2018 0355   TCO2 23 05/03/2018 1456   ACIDBASEDEF 4.4 (H) 05/04/2018 0355   O2SAT 92.6 05/04/2018 0355       Component Value Date/Time   IRON 71 09/11/2014 1009   TIBC 337 09/11/2014 1009   FERRITIN 72 09/11/2014 1009   IRONPCTSAT 21 09/11/2014 1009       ASSESSMENT/PLAN:     Problem List Items Addressed This Visit      Other   * (Principal) Hyperkalemia - Primary    Other Visit Diagnoses    Abdominal tightness       Relevant Orders   DG Abd 1 View (Completed)   Acute respiratory failure ([place])       Relevant Orders   DG CHEST PORT 1 VIEW (Completed)   DG CHEST PORT 1 VIEW (Completed)   DG CHEST PORT 1 VIEW (Completed)   On mechanically assisted ventilation (HCC)       Relevant Orders   DG CHEST PORT 1 VIEW (Completed)   Respiratory  failure (HCC)       Relevant Orders   DG CHEST PORT 1 VIEW (Completed)   DG CHEST PORT 1 VIEW (Completed)   DG CHEST PORT 1 VIEW (Completed)   Encounter for nasogastric (NG) tube placement       Relevant Orders   DG Abd Portable 1V (Completed)   Acute respiratory failure with hypoxia ([place])       Relevant Orders   DG  CHEST PORT 1 VIEW (Completed)   DG CHEST PORT 1 VIEW (Completed)   Encounter for intubation       Relevant Orders   DG CHEST PORT 1 VIEW (Completed)   Aspiration pneumonia (HCC)       Relevant Medications   Pseudoephedrine-DM-GG (ROBITUSSIN COLD & COUGH PO)   albuterol (PROVENTIL) (2.5 MG/3ML) 0.083% nebulizer solution 10 mg (Completed)   fluticasone (FLONASE) 50 MCG/ACT nasal spray 1 spray   ceFEPIme (MAXIPIME) 1 g in sodium chloride 0.9 % 100 mL IVPB   albuterol (PROVENTIL) (2.5 MG/3ML) 0.083% nebulizer solution 2.5 mg (Completed)   albuterol (PROVENTIL) (2.5 MG/3ML) 0.083% nebulizer solution 2.5 mg   vancomycin (VANCOCIN) 2,000 mg in sodium chloride 0.9 % 500 mL IVPB (Completed)   linezolid (ZYVOX) IVPB 600 mg   Other Relevant Orders   DG Chest Port 1 View      48 year old female patient admitted with acute on chronic CKD stage III-IV and hyperkalemia who is now status post prolonged cardiac arrest on 05/03/2017.  1.  Chronic kidney disease stage III-IV with a baseline serum creatinine around 2.4-2.7 with underlying sarcoidosis.  2.  Acute kidney injury.  Likely secondary to shock and ATN related to cardiac arrest.  Urine output is improving.  Serum creatinine is improving.  No need for dialysis.  3.  Chronic respiratory failure secondary to sarcoidosis.  Continue with tracheostomy care.  4.  Hyperkalemia.  This has improved.  5.  Proteinuria.  Will likely need to be followed up as an outpatient.    Littleton Common, DO, [place]

## 2018-05-06 NOTE — Progress Notes (Signed)
Chaplain responded to referral from [place] at [facility]. Patient had coded as was transferred to [facility]. Pt's sister is a Furniture conservator/restorer in [employer].  It appears from note that pt has a 50 year old daughter and 95 year son.  Cecile Sheerer from [place] asked unit chaplain to visit family.  However patient is now recovering from surgery, asleep.  And no family is present.  Unit chaplain will continue to follow. Tamsen Snider Pager [phone]

## 2018-05-07 ENCOUNTER — Inpatient Hospital Stay (HOSPITAL_COMMUNITY): Payer: Medicare Other

## 2018-05-07 ENCOUNTER — Encounter (HOSPITAL_COMMUNITY): Payer: Self-pay | Admitting: Vascular Surgery

## 2018-05-07 DIAGNOSIS — J189 Pneumonia, unspecified organism: Secondary | ICD-10-CM

## 2018-05-07 DIAGNOSIS — R7881 Bacteremia: Secondary | ICD-10-CM

## 2018-05-07 LAB — COMPREHENSIVE METABOLIC PANEL
ALT: 21 U/L (ref 0–44)
AST: 14 U/L — ABNORMAL LOW (ref 15–41)
Albumin: 1.6 g/dL — ABNORMAL LOW (ref 3.5–5.0)
Alkaline Phosphatase: 114 U/L (ref 38–126)
Anion gap: 9 (ref 5–15)
BUN: 42 mg/dL — ABNORMAL HIGH (ref 6–20)
CO2: 19 mmol/L — ABNORMAL LOW (ref 22–32)
Calcium: 7.9 mg/dL — ABNORMAL LOW (ref 8.9–10.3)
Chloride: 113 mmol/L — ABNORMAL HIGH (ref 98–111)
Creatinine, Ser: 4.17 mg/dL — ABNORMAL HIGH (ref 0.44–1.00)
GFR calc Af Amer: 14 mL/min — ABNORMAL LOW (ref >60)
GFR calc non Af Amer: 12 mL/min — ABNORMAL LOW (ref >60)
Glucose, Bld: 171 mg/dL — ABNORMAL HIGH (ref 70–99)
Potassium: 4.1 mmol/L (ref 3.5–5.1)
Sodium: 141 mmol/L (ref 135–145)
Total Bilirubin: 0.7 mg/dL (ref 0.3–1.2)
Total Protein: 5.7 g/dL — ABNORMAL LOW (ref 6.5–8.1)

## 2018-05-07 LAB — GLUCOSE, CAPILLARY
Glucose-Capillary: 116 mg/dL — ABNORMAL HIGH (ref 70–99)
Glucose-Capillary: 146 mg/dL — ABNORMAL HIGH (ref 70–99)
Glucose-Capillary: 152 mg/dL — ABNORMAL HIGH (ref 70–99)
Glucose-Capillary: 156 mg/dL — ABNORMAL HIGH (ref 70–99)
Glucose-Capillary: 158 mg/dL — ABNORMAL HIGH (ref 70–99)
Glucose-Capillary: 167 mg/dL — ABNORMAL HIGH (ref 70–99)
Glucose-Capillary: 201 mg/dL — ABNORMAL HIGH (ref 70–99)

## 2018-05-07 LAB — PHOSPHORUS
Phosphorus: 4.1 mg/dL (ref 2.5–4.6)
Phosphorus: 5.2 mg/dL — ABNORMAL HIGH (ref 2.5–4.6)

## 2018-05-07 LAB — MAGNESIUM
Magnesium: 1.3 mg/dL — ABNORMAL LOW (ref 1.7–2.4)
Magnesium: 2.4 mg/dL (ref 1.7–2.4)

## 2018-05-07 MED ORDER — FAMOTIDINE 40 MG/5ML PO SUSR
20.0000 mg | Freq: Every day | ORAL | Status: DC
  Administered 2018-05-07 – 2018-05-08 (×2): 20 mg
  Filled 2018-05-07 (×3): qty 2.5

## 2018-05-07 MED ORDER — BISACODYL 10 MG RE SUPP
10.0000 mg | Freq: Every day | RECTAL | Status: DC | PRN
  Administered 2018-05-07 – 2018-05-11 (×3): 10 mg via RECTAL
  Filled 2018-05-07 (×3): qty 1

## 2018-05-07 MED ORDER — MAGNESIUM SULFATE 4 GM/100ML IV SOLN
4.0000 g | Freq: Once | INTRAVENOUS | Status: AC
  Administered 2018-05-07: 4 g via INTRAVENOUS
  Filled 2018-05-07: qty 100

## 2018-05-07 MED ORDER — FUROSEMIDE 10 MG/ML IJ SOLN
80.0000 mg | Freq: Once | INTRAMUSCULAR | Status: AC
  Administered 2018-05-07: 80 mg via INTRAVENOUS
  Filled 2018-05-07: qty 8

## 2018-05-07 NOTE — Progress Notes (Signed)
[facility] Infusion Coordinator will follow pt with ID Team to support Home Infusion Pharmacy services at [place] if home IV ABX needed.  If patient discharges after hours, please call [phone].   Larry Sierras 05/07/2018, 8:27 AM

## 2018-05-07 NOTE — Progress Notes (Signed)
[facility] for Infectious Disease  Date of Admission:  04/30/2018     Total days of antibiotics 5         ASSESSMENT/PLAN  Melody Gray continues to receive antimicrobial therapy for MRSA bacteremia and pneumonia status post PEA arrest complicated by left compartment syndrome. Chest x-ray remains with bilateral dense opacities. On Day 5 of cefepime and Day 4 of Linezolid. Current plan is to stop Cefepime at Day 7 and will continue Linezolid for MRSA bacteremia. TTE scheduled for today and is pending. Clinically she continues to do well and CCM is working on diuresis and weaning.   1. Continue current Cefepime and linezolid. May consider changing linezolid to Daptomycin at the completion of treatment for pneumonia.  2. Continue to monitor repeat cultures and fever. 3. Duration of treatment pending TTE and possible TEE results (if indicated).    Principal Problem:   MRSA bacteremia Active Problems:   Pneumonia   Lesion of liver and cirrhosis on imaging   Insulin-requiring or dependent type II diabetes mellitus ([place])   Hypertension complicating diabetes ([place])   Acute on chronic respiratory failure with hypoxemia (HCC)   Acute kidney injury (AKI) with acute tubular necrosis (ATN) (HCC)   Acute renal failure superimposed on stage 4 chronic kidney disease (HCC)   Chronic respiratory failure with hypoxia (HCC)/ trach dep   Hyperkalemia   Chronic pulmonary hypertension (HCC)   Cardiac arrest (HCC)   Nontraumatic compartment syndrome of left lower extremity   . chlorhexidine gluconate (MEDLINE KIT)  15 mL Mouth Rinse BID  . famotidine  20 mg Per Tube QHS  . feeding supplement (PRO-STAT SUGAR FREE 64)  30 mL Per Tube Daily  . fluticasone  1 spray Each Nare Daily  . free water  200 mL Per Tube Q8H  . insulin aspart  0-20 Units Subcutaneous Q4H  . insulin detemir  20 Units Subcutaneous QHS  . mouth rinse  15 mL Mouth Rinse 10 times per day  . sodium chloride flush  3 mL Intravenous  Q12H    SUBJECTIVE:  Afebrile overnight with no acute events. Repeat cultures from 1/6 without growth to date. Chest x-ray from today with unchanged bilateral lung opacities.   Allergies  Allergen Reactions  . Vicodin [Hydrocodone-Acetaminophen] Itching     Review of Systems: Review of Systems  Constitutional: Negative for chills and fever.  Respiratory: Negative for cough, sputum production, shortness of breath and wheezing.   Cardiovascular: Negative for chest pain and leg swelling.  Gastrointestinal: Negative for abdominal pain, constipation, nausea and vomiting.  Skin: Negative for rash.     OBJECTIVE: Vitals:   05/07/18 1035 05/07/18 1100 05/07/18 1129 05/07/18 1200  BP:  114/65  119/84  Pulse: (!) 105 (!) 103 (!) 111 (!) 106  Resp: 18  20   Temp:      TempSrc:      SpO2: 94% 94% 95% 94%  Weight:      Height:       Body mass index is 45.49 kg/m.  Physical Exam Constitutional:      General: She is not in acute distress.    Appearance: She is well-developed.     Comments: Lying in bed with head of bed elevated; resting receiving percussion therapy  Cardiovascular:     Rate and Rhythm: Normal rate and regular rhythm.     Heart sounds: Normal heart sounds.  Pulmonary:     Effort: Pulmonary effort is normal.  Breath sounds: Normal breath sounds. No wheezing, rhonchi or rales.  Musculoskeletal:     Comments: Dressing is clean and dry.   Skin:    General: Skin is warm and dry.  Neurological:     Mental Status: She is alert and oriented to person, place, and time.     Lab Results Lab Results  Component Value Date   WBC 10.1 05/03/2018   HGB 9.1 (L) 05/03/2018   HCT 32.5 (L) 05/03/2018   MCV 104.8 (H) 05/03/2018   PLT 115 (L) 05/03/2018    Lab Results  Component Value Date   CREATININE 4.17 (H) 05/07/2018   BUN 42 (H) 05/07/2018   NA 141 05/07/2018   K 4.1 05/07/2018   CL 113 (H) 05/07/2018   CO2 19 (L) 05/07/2018    Lab Results  Component  Value Date   ALT 21 05/07/2018   AST 14 (L) 05/07/2018   ALKPHOS 114 05/07/2018   BILITOT 0.7 05/07/2018     Microbiology: Recent Results (from the past 240 hour(s))  MRSA PCR Screening     Status: None   Collection Time: 04/30/18 11:34 PM  Result Value Ref Range Status   MRSA by PCR NEGATIVE NEGATIVE Final    Comment:        The GeneXpert MRSA Assay (FDA approved for NASAL specimens only), is one component of a comprehensive MRSA colonization surveillance program. It is not intended to diagnose MRSA infection nor to guide or monitor treatment for MRSA infections. Performed at [facility], [place] [address], [place], [place] [address]   Culture, blood (routine x 2)     Status: Abnormal   Collection Time: 05/03/18  5:25 PM  Result Value Ref Range Status   Specimen Description BLOOD RIGHT ARM  Final   Special Requests   Final    BOTTLES DRAWN AEROBIC ONLY Blood Culture adequate volume Performed at [facility] [place] [address], [place], [place] [address]    Culture  Setup Time   Final    GRAM POSITIVE COCCI AEROBIC BOTTLE ONLY CRITICAL RESULT CALLED TO, READ BACK BY AND VERIFIED WITH: M Winston [id number] BY GF    Culture METHICILLIN RESISTANT STAPHYLOCOCCUS AUREUS (A)  Final   Report Status 05/06/2018 FINAL  Final   Organism ID, Bacteria METHICILLIN RESISTANT STAPHYLOCOCCUS AUREUS  Final      Susceptibility   Methicillin resistant staphylococcus aureus - MIC*    CIPROFLOXACIN <=0.5 SENSITIVE Sensitive     ERYTHROMYCIN >=8 RESISTANT Resistant     GENTAMICIN <=0.5 SENSITIVE Sensitive     OXACILLIN >=4 RESISTANT Resistant     TETRACYCLINE <=1 SENSITIVE Sensitive     VANCOMYCIN <=0.5 SENSITIVE Sensitive     TRIMETH/SULFA <=10 SENSITIVE Sensitive     CLINDAMYCIN <=0.25 SENSITIVE Sensitive     RIFAMPIN <=0.5 SENSITIVE Sensitive     Inducible Clindamycin NEGATIVE Sensitive     * METHICILLIN RESISTANT STAPHYLOCOCCUS AUREUS  Blood  Culture ID Panel (Reflexed)     Status: Abnormal   Collection Time: 05/03/18  5:25 PM  Result Value Ref Range Status   Enterococcus species NOT DETECTED NOT DETECTED Final   Listeria monocytogenes NOT DETECTED NOT DETECTED Final   Staphylococcus species (A) NOT DETECTED Final    CRITICAL RESULT CALLED TO, READ BACK BY AND VERIFIED WITH:    CommentVernelle Emerald [id number] BY GF   Staphylococcus aureus (BCID) (A) NOT DETECTED Final    CRITICAL RESULT CALLED TO, READ BACK  BY AND VERIFIED WITH:    CommentVernelle Emerald PHARMD [phone] BY GF   Methicillin resistance DETECTED (A) NOT DETECTED Final    Comment: CRITICAL RESULT CALLED TO, READ BACK BY AND VERIFIED WITH: M Grawn [id number] BY GF    Streptococcus species NOT DETECTED NOT DETECTED Final   Streptococcus agalactiae NOT DETECTED NOT DETECTED Final   Streptococcus pneumoniae NOT DETECTED NOT DETECTED Final   Streptococcus pyogenes NOT DETECTED NOT DETECTED Final   Acinetobacter baumannii NOT DETECTED NOT DETECTED Final   Enterobacteriaceae species NOT DETECTED NOT DETECTED Final   Enterobacter cloacae complex NOT DETECTED NOT DETECTED Final   Escherichia coli NOT DETECTED NOT DETECTED Final   Klebsiella oxytoca NOT DETECTED NOT DETECTED Final   Klebsiella pneumoniae NOT DETECTED NOT DETECTED Final   Proteus species NOT DETECTED NOT DETECTED Final   Serratia marcescens NOT DETECTED NOT DETECTED Final   Haemophilus influenzae NOT DETECTED NOT DETECTED Final   Neisseria meningitidis NOT DETECTED NOT DETECTED Final   Pseudomonas aeruginosa NOT DETECTED NOT DETECTED Final   Candida albicans NOT DETECTED NOT DETECTED Final   Candida glabrata NOT DETECTED NOT DETECTED Final   Candida krusei NOT DETECTED NOT DETECTED Final   Candida parapsilosis NOT DETECTED NOT DETECTED Final   Candida tropicalis NOT DETECTED NOT DETECTED Final    Comment: Performed at [facility], [place]. [address] [place], [place] [address]  Culture,  blood (routine x 2)     Status: None (Preliminary result)   Collection Time: 05/03/18  6:02 PM  Result Value Ref Range Status   Specimen Description   Final    BLOOD LEFT HAND Performed at [place] [address] [place], [place] [address]    Special Requests   Final    BOTTLES DRAWN AEROBIC AND ANAEROBIC Blood Culture adequate volume   Culture   Final    NO GROWTH 4 DAYS Performed at [facility], [place] [address] [place], [place] [address]    Report Status PENDING  Incomplete  Culture, Urine     Status: None   Collection Time: 05/03/18  6:29 PM  Result Value Ref Range Status   Specimen Description   Final    URINE, CATHETERIZED Performed at [place] [address], [place], [place] [address]    Special Requests   Final    NONE Performed at [facility], [place] [address], [place], [place] [address]    Culture   Final    NO GROWTH Performed at [facility], [place] [address] [place], [place] [address]    Report Status 05/05/2018 FINAL  Final  Culture, respiratory     Status: None   Collection Time: 05/04/18  1:50 AM  Result Value Ref Range Status   Specimen Description TRACHEAL ASPIRATE  Final   Special Requests NONE  Final   Gram Stain   Final    ABUNDANT WBC PRESENT, PREDOMINANTLY PMN NO ORGANISMS SEEN    Culture   Final    Consistent with normal respiratory flora. Performed at [facility], [place] [address] [place],  [address]    Report Status 05/06/2018 FINAL  Final  Culture, blood (Routine X 2) w Reflex to ID Panel     Status: None (Preliminary result)   Collection Time: 05/06/18 12:00 PM  Result Value Ref Range Status   Specimen Description BLOOD RIGHT ANTECUBITAL  Final   Special Requests  Final    BOTTLES DRAWN AEROBIC ONLY Blood Culture results may not be optimal due to an inadequate volume of blood received in culture bottles   Culture   Final    NO GROWTH < 24  HOURS Performed at [place] [address], [place], [place] [address]    Report Status PENDING  Incomplete  Culture, blood (Routine X 2) w Reflex to ID Panel     Status: None (Preliminary result)   Collection Time: 05/06/18 12:05 PM  Result Value Ref Range Status   Specimen Description BLOOD RIGHT ANTECUBITAL  Final   Special Requests   Final    BOTTLES DRAWN AEROBIC ONLY Blood Culture results may not be optimal due to an inadequate volume of blood received in culture bottles   Culture   Final    NO GROWTH < 24 HOURS Performed at [facility], [place] [address] [place], [place] [address]    Report Status PENDING  Incomplete     Terri Piedra, [place] [facility] [phone] Pager  05/07/2018  12:22 PM

## 2018-05-07 NOTE — Progress Notes (Signed)
Patient vomited a small amount into an emesis bag. Zofran had already been given. Pt's HR in the 110's. She is very anxious. Tube feeding has been paused. Etty Isaac, Rande Brunt, RN

## 2018-05-07 NOTE — Progress Notes (Addendum)
NAME:  Melody Gray, MRN:  [id number], DOB:  09-08-1970, LOS: 6 ADMISSION DATE:  04/30/2018, CONSULTATION DATE:  05/03/18 REFERRING MD:  Dr. Valeta Harms, CHIEF COMPLAINT:  Cardiac arrest    Brief History   48 y/o F with sarcoidosis & chronic tracheostomy in the setting of OHS admitted 12/31 with AKI and hyperkalemia.  Developed PEA arrest after admit requiring CPR ~ 40 minutes.  She required an emergent trach change during the arrest for ventilation which was unsuccessful.  ETT intubation was unsuccessful as well.  ENT placed an ETT via her tracheostomy stoma.  She was then found to have a pulseless left extremity and was placed on anticoagulation.  Tx to [facility] for VVS evaluation.    Past Medical History  Sarcoidosis - lung, liver involvement; OSA/OHS s/p trach  Pulmonary HTN - pa peak 83 on ECHO 07/2014; HTN; DM  CKD II; NICM - LVEF 55-60% in 07/2014   Significant Hospital Events   12/31  Admit with AKI, hyperkalemia  01/03  PEA arrest, 40 min before ROSC.  DIFFICULT AIRWAY.  01/04  ENT created new trach tube out of #7 ETT  1/6 back to OR for closure of LLE. Hemodynamics improved.  1/7: Hemodynamically stable remains off pressors.  Echocardiogram pending for evaluation of bacteremia blood cultures pending.  Fully awake.  Starting diuresis, working on weaning. Consults:  VVS  Procedures:  L I-O 1/3 >> 1/3  ALine 1/3 >>  TLC 1/3 >>  ETT via Stoma 1/3 >>   Significant Diagnostic Tests:  CT ABD/Pelvis 1/3 >> large LLL PNA, probable RML as well, hepatic cirrhosis, stable 3.1 cm low density in midpole of left kidney ECHO 1/7>>> Micro Data:  BCx2 1/3 >> 1/2 with GPC's >> MRSA  Tracheal aspirate 1/4 >>  UC 1/3 >>   Antimicrobials:  Levaquin 1/3 >> 1/3  Cefepime 1/3 >>  linezolid 1/3>>>  Interim history/subjective:  No distress, tolerating weaning Objective   Blood pressure 130/71, pulse 98, temperature 99.2 F (37.3 C), temperature source Oral, resp. rate (Abnormal) 28, height  5\' 1"  (1.549 m), weight 109.2 kg, SpO2 95 %.    Vent Mode: PSV;CPAP FiO2 (%):  [40 %] 40 % Set Rate:  [28 bmp] 28 bmp Vt Set:  [420 mL] 420 mL PEEP:  [8 cmH20-10 cmH20] 8 cmH20 Pressure Support:  [13 cmH20] 13 cmH20 Plateau Pressure:  [24 cmH20-27 cmH20] 24 cmH20   Intake/Output Summary (Last 24 hours) at 05/07/2018 1007 Last data filed at 05/07/2018 1000 Gross per 24 hour  Intake 3431.63 ml  Output 1710 ml  Net 1721.63 ml   Filed Weights   05/05/18 0637 05/06/18 0600 05/07/18 0600  Weight: 107.5 kg 106.9 kg 109.2 kg    Examination:  General: This pleasant 48 year old female resting in bed she is in no acute distress HEENT normocephalic atraumatic still has facial edema tracheostomy is unremarkable in regards to the current endotracheal tube utilizes trach Pulmonary: Diminished throughout no accessory use Cardiac: Regular rate and rhythm Abdomen: Soft nontender Extremities: Warm dry brisk capillary refill dependent edema Neuro: Awake oriented no focal deficits equal strength bilaterally GU: Clear yellow  Resolved Hospital Problem list     PEA arrest 1/3 Cardiogenic shock  Assessment & Plan:   Acute on chronic hypoxic respiratory failure in the setting of volume overload, and acute on chronic renal failure.  Superimposed on underlying advanced sarcoidosis, and sleep apnea -She has been tracheostomy dependent for several years secondary to difficult airway and prior traumatic intubation -  Tolerating pressure support ventilation Plan Aerosol trach collar and pressure support as tolerated Out of bed Lasix VAP bundle Treating pneumonia   Probable aspiration pneumonia versus hcap Chest x-ray personally reviewed: Left-sided aeration continues to improve.  Ongoing right sided airspace disease which may be element of asymmetric edema superimposed on aspiration versus atelectasis Plan  Day #5 cefepime, day #4 Zyvox; will discontinue cefepime after day 7  Chronic trach  dependence  Plan We will have size 4 cuffless trach at bedside in case she inadvertently gets decannulated Keep current tracheostomy in place Eventually changed back to cuffless trach once off vent  MRSA bacteremia (1 of 3 bottles) Plan Continue antibiotics Repeat culture sent Transthoracic echocardiogram ordered If blood cultures negative and echo negative, will treat with 2 weeks of linezolid for simple bacteremia  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 chemistry Keep Foley in place at this point   Hepatic Cirrhosis prob 2/2 chronic PH P: Supportive care  Treating hypoxia   Best practice:  Diet: NPO Pain/Anxiety/Delirium protocol (if indicated): Fentanyl for pain VAP protocol (if indicated): in place  DVT prophylaxis: SCD on RLE  GI prophylaxis: pepcid  Glucose control: SSI Mobility: bed rest  Code Status: Full code  Family Communication: Sister Justice Rocher, works in food services at [employer]) updated at bedside 1/4.family updated at bedside  Disposition: Continues to improve clinically.  For today we will continue to work towards weaning.  Mobilize, follow-up on echocardiogram and continue supportive care.  Critical care time: 33 min   [name] ACNP-BC [place] Pager # [phone] OR # [phone] if no answer

## 2018-05-07 NOTE — Progress Notes (Signed)
[facility]  Subjective  - no new complaints post op fasciotomy closure.   Objective 120/82 89 99.1 F (37.3 C) (Oral) (!) 28 96%  Intake/Output Summary (Last 24 hours) at 05/07/2018 0747 Last data filed at 05/07/2018 7092 Gross per 24 hour  Intake 3395.07 ml  Output 1760 ml  Net 1635.07 ml    Active range of motion toes and minimal ankle motion left LE Compartments soft, dressing clean and dry. Foot well perfused  Assessment/Planning: POD # 1 Closure of left lower extremity medial and lateral fasciotomy incisions  Will change dressing tomorrow for incision inspection.   Roxy Horseman 05/07/2018 7:47 AM --  Laboratory Lab Results: No results for input(s): WBC, HGB, HCT, PLT in the last 72 hours. BMET Recent Labs    05/06/18 0626 05/07/18 0611  NA 140 141  K 4.2 4.1  CL 115* 113*  CO2 18* 19*  GLUCOSE 103* 171*  BUN 42* 42*  CREATININE 4.64* 4.17*  CALCIUM 8.3* 7.9*    COAG Lab Results  Component Value Date   INR 1.13 08/12/2014   INR 0.96 11/09/2011   INR 0.95 08/30/2011   No results found for: PTT

## 2018-05-07 NOTE — Progress Notes (Signed)
  Echocardiogram 2D Echocardiogram has been performed.  Melody Gray 05/07/2018, 5:34 PM

## 2018-05-07 NOTE — Progress Notes (Signed)
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  1.  Chronic kidney disease stage III-IV with a baseline serum creatinine around 2.4-2.7 with underlying sarcoidosis.  2.  Acute kidney injury.  Likely secondary to shock and ATN related to cardiac arrest.  Urine output is improving.  Serum creatinine is improving.  No need for dialysis. If renal function is improved tomorrow again then we will sign off.  3.  Chronic respiratory failure secondary to sarcoidosis.  Continue with tracheostomy care.  4.  Hyperkalemia.  This has improved.  5.  Proteinuria.  Will likely need to be followed up as an outpatient.  Subjective:   Nausea and vomiting + constipation and abd pain. Denies f/myalgias   Objective:   BP 131/84   Pulse 92   Temp 98.8 F (37.1 C) (Oral)   Resp 18   Ht _0  (1.549 m)   Wt 109.2 kg   SpO2 94%   BMI 45.49 kg/m   Intake/Output Summary (Last 24 hours) at 05/07/2018 1536 Last data filed at 05/07/2018 1500 Gross per 24 hour  Intake 3057.42 ml  Output 1800 ml  Net 1257.42 ml   Weight change: 2.3 kg  Physical Exam: General:  Alert, communicating, NAD, pleasant Neck:  No JVD, positive trach CV:  Heart RRR  Lungs:  L/S CTA bilaterally decreased breath sounds at the bases  abd: Mild distention, nontender with normal BS Extremities: +1 bilateral lower extremity edema  Imaging: Dg Chest Port 1 View  Result Date: 05/07/2018 CLINICAL DATA:  Aspiration pneumonia EXAM: PORTABLE CHEST 1 VIEW COMPARISON:  05/06/2018 FINDINGS: Tracheostomy tube in place. An orogastric tube at least reaches the stomach. Dense bilateral lower lung opacities. Chronic cardiomegaly. No visible effusion meniscus. No pneumothorax. IMPRESSION: 1. Unchanged bilateral airspace disease. 2. Stable hardware positioning.  Electronically Signed   By: Monte Fantasia M.D.   On: 05/07/2018 06:36   Dg Chest Port 1 View  Result Date: 05/06/2018 CLINICAL DATA:  Acute respiratory failure with hypoxia. EXAM: PORTABLE CHEST 1 VIEW COMPARISON:  05/05/2018 FINDINGS: Tracheostomy tube in stable position. Nasogastric tube extends into the abdomen. Improved aeration in the left lung with increased hazy densities in the mid and lower right chest. Negative for pneumothorax. Cardiac silhouette remains enlarged. Persistent densities at the left lung base. IMPRESSION: 1. Improved aeration in the left upper lung. 2. Persistent and slightly increased densities in right lung. Findings are concerning for airspace disease and possibly infection. 3. Persistent densities or consolidation at the left lung base. 4. Stable enlargement of the cardiac silhouette. Electronically Signed   By: Markus Daft M.D.   On: 05/06/2018 07:48    Labs: BMET Recent Labs  Lab 05/03/18 2637 05/03/18 1456 05/03/18 1608 05/04/18 0429 05/04/18 1703 05/05/18 0552 05/06/18 0626 05/06/18 1531 05/07/18 0611  NA 135 141 140 139 140 139 140  --  141  K 6.1* 4.4 5.5* 4.6 4.3 4.1 4.2  --  4.1  CL 103 112* 108 111 113* 112* 115*  --  113*  CO2 23  --  26 20* 20* 19* 18*  --  19*  GLUCOSE 152* 262* 228* 167* 147* 121* 103*  --  171*  BUN 43* 38* 42* 41* 44* 43* 42*  --  42*  CREATININE 4.52* 3.60* 4.67* 4.88* 4.90* 5.01* 4.64*  --  4.17*  CALCIUM 8.2*  --  8.5* 8.0* 8.2* 8.3* 8.3*  --  7.9*  PHOS 6.2*  --   --   --   --   --   --  4.3 4.1   CBC Recent Labs  Lab 04/30/18 1902  05/01/18 0538 05/02/18 0815 05/03/18 1456 05/03/18 1608  WBC 4.0  --  4.7 9.1  --  10.1  NEUTROABS 1.8  --   --  7.0  --   --   HGB 10.8*   < > 10.3* 9.9* 9.2* 9.1*  HCT 37.3   < > 36.2 35.2* 27.0* 32.5*  MCV 102.2*  --  103.4* 104.1*  --  104.8*  PLT 162  --  136* 148*  --  115*   < > = values in this interval not displayed.    Medications:    . chlorhexidine gluconate  (MEDLINE KIT)  15 mL Mouth Rinse BID  . famotidine  20 mg Per Tube QHS  . feeding supplement (PRO-STAT SUGAR FREE 64)  30 mL Per Tube Daily  . fluticasone  1 spray Each Nare Daily  . free water  200 mL Per Tube Q8H  . insulin aspart  0-20 Units Subcutaneous Q4H  . insulin detemir  20 Units Subcutaneous QHS  . mouth rinse  15 mL Mouth Rinse 10 times per day  . sodium chloride flush  3 mL Intravenous Q12H      Otelia Santee, MD 05/07/2018, 3:36 PM

## 2018-05-07 NOTE — Progress Notes (Signed)
Pt placed on ATC at 40% for approx 10 minutes.  Pt said she didn't feel like she was getting enough air. Placed pt back on PSV/CPAP  8/3.  Advised pt and family members we would try ATC later in afternoon.

## 2018-05-08 ENCOUNTER — Telehealth: Payer: Self-pay | Admitting: Vascular Surgery

## 2018-05-08 DIAGNOSIS — J15212 Pneumonia due to Methicillin resistant Staphylococcus aureus: Secondary | ICD-10-CM

## 2018-05-08 LAB — GLUCOSE, CAPILLARY
Glucose-Capillary: 104 mg/dL — ABNORMAL HIGH (ref 70–99)
Glucose-Capillary: 109 mg/dL — ABNORMAL HIGH (ref 70–99)
Glucose-Capillary: 76 mg/dL (ref 70–99)
Glucose-Capillary: 83 mg/dL (ref 70–99)
Glucose-Capillary: 90 mg/dL (ref 70–99)
Glucose-Capillary: 91 mg/dL (ref 70–99)

## 2018-05-08 LAB — CULTURE, BLOOD (ROUTINE X 2)
Culture: NO GROWTH
Special Requests: ADEQUATE

## 2018-05-08 LAB — COMPREHENSIVE METABOLIC PANEL
ALT: 18 U/L (ref 0–44)
AST: 15 U/L (ref 15–41)
Albumin: 1.7 g/dL — ABNORMAL LOW (ref 3.5–5.0)
Alkaline Phosphatase: 112 U/L (ref 38–126)
Anion gap: 7 (ref 5–15)
BUN: 43 mg/dL — ABNORMAL HIGH (ref 6–20)
CO2: 21 mmol/L — ABNORMAL LOW (ref 22–32)
Calcium: 8.7 mg/dL — ABNORMAL LOW (ref 8.9–10.3)
Chloride: 111 mmol/L (ref 98–111)
Creatinine, Ser: 4.02 mg/dL — ABNORMAL HIGH (ref 0.44–1.00)
GFR calc Af Amer: 14 mL/min — ABNORMAL LOW (ref >60)
GFR calc non Af Amer: 12 mL/min — ABNORMAL LOW (ref >60)
Glucose, Bld: 93 mg/dL (ref 70–99)
Potassium: 4 mmol/L (ref 3.5–5.1)
Sodium: 139 mmol/L (ref 135–145)
Total Bilirubin: 0.5 mg/dL (ref 0.3–1.2)
Total Protein: 6 g/dL — ABNORMAL LOW (ref 6.5–8.1)

## 2018-05-08 LAB — ECHOCARDIOGRAM COMPLETE
Height: 61 in
Weight: 3851.88 oz

## 2018-05-08 LAB — MAGNESIUM: Magnesium: 2.1 mg/dL (ref 1.7–2.4)

## 2018-05-08 LAB — PHOSPHORUS: Phosphorus: 4.4 mg/dL (ref 2.5–4.6)

## 2018-05-08 MED ORDER — FUROSEMIDE 10 MG/ML IJ SOLN
80.0000 mg | Freq: Once | INTRAMUSCULAR | Status: AC
  Administered 2018-05-08: 80 mg via INTRAVENOUS
  Filled 2018-05-08: qty 8

## 2018-05-08 NOTE — Telephone Encounter (Signed)
sch appt lvm mld ltr 05/23/2018 330pm p/o MD

## 2018-05-08 NOTE — Progress Notes (Signed)
112ml Fentanyl wasted in sink with Ilona Sorrel, RN

## 2018-05-08 NOTE — Progress Notes (Signed)
NAME:  Melody Gray, MRN:  [id number], DOB:  July 23, 1970, LOS: 7 ADMISSION DATE:  04/30/2018, CONSULTATION DATE:  05/03/18 REFERRING MD:  Dr. Valeta Harms, CHIEF COMPLAINT:  Cardiac arrest    Brief History   48 y/o F with sarcoidosis & chronic tracheostomy in the setting of OHS admitted 12/31 with AKI and hyperkalemia.  Developed PEA arrest after admit requiring CPR ~ 40 minutes.  She required an emergent trach change during the arrest for ventilation which was unsuccessful.  ETT intubation was unsuccessful as well.  ENT placed an ETT via her tracheostomy stoma.  She was then found to have a pulseless left extremity and was placed on anticoagulation.  Tx to [facility] for VVS evaluation.    Past Medical History  Sarcoidosis - lung, liver involvement; OSA/OHS s/p trach  Pulmonary HTN - pa peak 83 on ECHO 07/2014; HTN; DM  CKD II; NICM - LVEF 55-60% in 07/2014   Significant Hospital Events   12/31  Admit with AKI, hyperkalemia  01/03  PEA arrest, 40 min before ROSC.  DIFFICULT AIRWAY.  01/04  ENT created new trach tube out of #7 ETT  1/6 back to OR for closure of LLE. Hemodynamics improved.  1/7: Hemodynamically stable remains off pressors.  Echocardiogram pending for evaluation of bacteremia blood cultures pending.  Fully awake.  Starting diuresis, working on weaning. Consults:  VVS  Procedures:  L I-O 1/3 >> 1/3  ALine 1/3 >>  TLC 1/3 >>  ETT via Stoma 1/3 >>   Significant Diagnostic Tests:  CT ABD/Pelvis 1/3 >> large LLL PNA, probable RML as well, hepatic cirrhosis, stable 3.1 cm low density in midpole of left kidney ECHO 1/7>>> Micro Data:  BCx2 1/3 >> 1/2 with GPC's >> MRSA  Tracheal aspirate 1/4 >>  UC 1/3 >>   Antimicrobials:  Levaquin 1/3 >> 1/3  Cefepime 1/3 >>  linezolid 1/3>>>  Interim history/subjective:  No issues overnight.  Still on the ventilator.  Did not tolerate weaning trials with current trach in place.  Dressing change for left lower extremity fasciotomy,  incisions clean dry and intact  Objective   Blood pressure 111/63, pulse (!) 102, temperature 99 F (37.2 C), temperature source Axillary, resp. rate (!) 28, height 5\' 1"  (1.549 m), weight 107.7 kg, SpO2 94 %.    Vent Mode: PRVC FiO2 (%):  [40 %] 40 % Set Rate:  [28 bmp] 28 bmp Vt Set:  [420 mL] 420 mL PEEP:  [5 cmH20-8 cmH20] 5 cmH20 Pressure Support:  [8 cmH20-13 cmH20] 10 cmH20 Plateau Pressure:  [22 cmH20-23 cmH20] 23 cmH20   Intake/Output Summary (Last 24 hours) at 05/08/2018 0835 Last data filed at 05/08/2018 0700 Gross per 24 hour  Intake 1672.59 ml  Output 2785 ml  Net -1112.41 ml   Filed Weights   05/06/18 0600 05/07/18 0600 05/08/18 0500  Weight: 106.9 kg 109.2 kg 107.7 kg    Examination:  General: Pleasant 48 year old female, trached on mechanical ventilator, comfortable no acute distress HEENT: NCAT, sclera clear, pupils reactive Pulmonary: Bilateral ventilated breath sounds, diminished in the bases, no crackles Cardiac: Regular rate and rhythm, no MRG, S1-S2 Abdomen: Soft, nontender, nondistended Extremities: Left lower extremity status post fasciotomy, incisions clean dry and intact, stitches in place, minimal serosanguineous drainage Neuro: Wake alert, following commands, no focal deficit  Resolved Hospital Problem list     PEA arrest 1/3 Cardiogenic shock  Assessment & Plan:   Acute on chronic hypoxic respiratory failure in the setting of volume overload,  and acute on chronic renal failure.  Superimposed on underlying advanced sarcoidosis, and sleep apnea -She has been tracheostomy dependent for several years secondary to difficult airway and prior traumatic intubation -Tolerating pressure support ventilation Plan Did not tolerate trach collar.  Needed to stay on pressure support. Has trouble with the current tracheostomy. ENT managing difficult airway. I suspect will need placement of a new trach. Needs up out of bed as much as tolerated Early  mobility Patient remains in the intensive care unit due to her very difficult airway and need for close respiratory monitoring.  High risk for decompensation if airway was lost and likely death.  Probable aspiration pneumonia versus hcap Chest x-ray personally reviewed: Left-sided aeration continues to improve.  Ongoing right sided airspace disease which may be element of asymmetric edema superimposed on aspiration versus atelectasis Plan  Continue cefepime and linezolid.  Chronic trach dependence  Plan We will have size 4 cuffless trach at bedside in case she inadvertently gets decannulated Keep current tracheostomy in place Eventually changed back to cuffless trach once off vent  MRSA bacteremia (1 of 3 bottles) Plan If repeat blood cultures finalized as negative will need 2 weeks of empiric treatment for MRSA bacteremia.  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  Pulmonary HTN -in setting of sarcoidosis  P:  Supportive care, continue to follow nothing further needed Diuresis to maintain euvolemia  Acute on chronic renal failure w/ worsening NAG metabolic acidosis -Renal functions continuing to improve, chloride normalizing acid-base improved P Additional dose of diuresis today, 80 mg Lasix Positive cumulative fluid balance.  Will need to reduce.  Hepatic Cirrhosis prob 2/2 chronic PH P: No intervention at this time.  Maintain euvolemia as above.  Best practice:  Diet: NPO Pain/Anxiety/Delirium protocol (if indicated): Fentanyl for pain VAP protocol (if indicated): in place  DVT prophylaxis: SCD on RLE  GI prophylaxis: pepcid  Glucose control: SSI Mobility: bed rest  Code Status: Full code  Family Communication: Sister Justice Rocher, works in food services at [employer]) updated at bedside 1/4.family updated at bedside  Disposition: Continues to improve clinically.  For today we  will continue to work towards weaning.  Mobilize, follow-up on echocardiogram and continue supportive care.  This patient is critically ill with multiple organ system failure; which, requires frequent high complexity decision making, assessment, support, evaluation, and titration of therapies. This was completed through the application of advanced monitoring technologies and extensive interpretation of multiple databases. During this encounter critical care time was devoted to patient care services described in this note for 32 minutes.   Garner Nash, DO [facility] 05/08/2018 8:35 AM  Personal pager: [phone] If unanswered, please page CCM On-call: [phone]

## 2018-05-08 NOTE — Progress Notes (Signed)
[facility]  Date of Admission:  04/30/2018     Total days of antibiotics 6         ASSESSMENT/PLAN  Melody Gray is a 48 y/o female found to have MRSA bacteremia and pneumonia status post PEA arrest complicated by left lower extremity compartment syndrome requiring fasciotomy and closure. Continues to improve and doing well with no fevers. Currently on Day 6 of Cefepime and Day 5 of Linezolid. Awaiting TEE results to guide duration of treatment for MRSA bacteremia. Will plan to continue Cefepime through tomorrow and Linezolid for at least 2 weeks possible longer depending upon TTE results.   1. Continue cefepime through tomorrow. 2. Continue linezolid with duration to be determined and consideration for change to daptomycin if prolonged therapy is needed.  3. Check WBC count tomorrow. 4. Monitor fevers and cultures.  5. Weaning and trach per CCM.   Principal Problem:   MRSA bacteremia Active Problems:   Pneumonia   Lesion of liver and cirrhosis on imaging   Insulin-requiring or dependent type II diabetes mellitus ([place])   Hypertension complicating diabetes ([place])   Acute on chronic respiratory failure with hypoxemia (HCC)   Acute kidney injury (AKI) with acute tubular necrosis (ATN) (HCC)   Acute renal failure superimposed on stage 4 chronic kidney disease (HCC)   Chronic respiratory failure with hypoxia (HCC)/ trach dep   Hyperkalemia   Chronic pulmonary hypertension (HCC)   Cardiac arrest (HCC)   Nontraumatic compartment syndrome of left lower extremity   . chlorhexidine gluconate (MEDLINE KIT)  15 mL Mouth Rinse BID  . famotidine  20 mg Per Tube QHS  . feeding supplement (PRO-STAT SUGAR FREE 64)  30 mL Per Tube Daily  . fluticasone  1 spray Each Nare Daily  . free water  200 mL Per Tube Q8H  . insulin aspart  0-20 Units Subcutaneous Q4H  . insulin detemir  20 Units Subcutaneous QHS  . mouth rinse  15 mL Mouth Rinse 10 times per day  . sodium  chloride flush  3 mL Intravenous Q12H    SUBJECTIVE:  Afebrile with no acute events overnight. Doing well and receiving vibration therapy. Chest x-rays reviewed and appear slightly improved. Unable to tolerate weaning at present.   Allergies  Allergen Reactions  . Vicodin [Hydrocodone-Acetaminophen] Itching     Review of Systems: Review of Systems  Constitutional: Negative for chills and fever.  Respiratory: Negative for cough, sputum production, shortness of breath and wheezing.   Cardiovascular: Negative for chest pain and leg swelling.  Gastrointestinal: Negative for abdominal pain, constipation, diarrhea, nausea and vomiting.  Skin: Negative for rash.  Neurological: Negative for weakness.      OBJECTIVE: Vitals:   05/08/18 0900 05/08/18 1000 05/08/18 1100 05/08/18 1103  BP: 100/62 110/65 133/79   Pulse: 82 82 (!) 106   Resp:   19   Temp:      TempSrc:      SpO2: 92% 94% 93% 92%  Weight:      Height:       Body mass index is 44.86 kg/m.  Physical Exam Constitutional:      General: She is not in acute distress.    Appearance: She is well-developed.     Comments: Lying in bed with head of bed elevated; tracheostomy.   Cardiovascular:     Rate and Rhythm: Normal rate and regular rhythm.     Heart sounds: Normal heart sounds.  Pulmonary:  Effort: Pulmonary effort is normal.     Breath sounds: Normal breath sounds.  Abdominal:     General: Abdomen is flat.     Palpations: Abdomen is soft.  Musculoskeletal:     Comments: Lower extremity dressing appears clean and dry.   Skin:    General: Skin is warm and dry.  Neurological:     Mental Status: She is alert and oriented to person, place, and time.  Psychiatric:        Mood and Affect: Mood normal.        Behavior: Behavior normal.     Lab Results Lab Results  Component Value Date   WBC 10.1 05/03/2018   HGB 9.1 (L) 05/03/2018   HCT 32.5 (L) 05/03/2018   MCV 104.8 (H) 05/03/2018   PLT 115 (L)  05/03/2018    Lab Results  Component Value Date   CREATININE 4.02 (H) 05/08/2018   BUN 43 (H) 05/08/2018   NA 139 05/08/2018   K 4.0 05/08/2018   CL 111 05/08/2018   CO2 21 (L) 05/08/2018    Lab Results  Component Value Date   ALT 18 05/08/2018   AST 15 05/08/2018   ALKPHOS 112 05/08/2018   BILITOT 0.5 05/08/2018     Microbiology: Recent Results (from the past 240 hour(s))  MRSA PCR Screening     Status: None   Collection Time: 04/30/18 11:34 PM  Result Value Ref Range Status   MRSA by PCR NEGATIVE NEGATIVE Final    Comment:        The GeneXpert MRSA Assay (FDA approved for NASAL specimens only), is one component of a comprehensive MRSA colonization surveillance program. It is not intended to diagnose MRSA infection nor to guide or monitor treatment for MRSA infections. Performed at [facility], [place] [address], [place], [place] [address]   Culture, blood (routine x 2)     Status: Abnormal   Collection Time: 05/03/18  5:25 PM  Result Value Ref Range Status   Specimen Description BLOOD RIGHT ARM  Final   Special Requests   Final    BOTTLES DRAWN AEROBIC ONLY Blood Culture adequate volume Performed at [facility] [address], [place], [place] [address]    Culture  Setup Time   Final    GRAM POSITIVE COCCI AEROBIC BOTTLE ONLY CRITICAL RESULT CALLED TO, READ BACK BY AND VERIFIED WITH: M [place] [phone] BY GF    Culture METHICILLIN RESISTANT STAPHYLOCOCCUS AUREUS (A)  Final   Report Status 05/06/2018 FINAL  Final   Organism ID, Bacteria METHICILLIN RESISTANT STAPHYLOCOCCUS AUREUS  Final      Susceptibility   Methicillin resistant staphylococcus aureus - MIC*    CIPROFLOXACIN <=0.5 SENSITIVE Sensitive     ERYTHROMYCIN >=8 RESISTANT Resistant     GENTAMICIN <=0.5 SENSITIVE Sensitive     OXACILLIN >=4 RESISTANT Resistant     TETRACYCLINE <=1 SENSITIVE Sensitive     VANCOMYCIN <=0.5 SENSITIVE Sensitive      TRIMETH/SULFA <=10 SENSITIVE Sensitive     CLINDAMYCIN <=0.25 SENSITIVE Sensitive     RIFAMPIN <=0.5 SENSITIVE Sensitive     Inducible Clindamycin NEGATIVE Sensitive     * METHICILLIN RESISTANT STAPHYLOCOCCUS AUREUS  Blood Culture ID Panel (Reflexed)     Status: Abnormal   Collection Time: 05/03/18  5:25 PM  Result Value Ref Range Status   Enterococcus species NOT DETECTED NOT DETECTED Final   Listeria monocytogenes NOT DETECTED NOT DETECTED Final   Staphylococcus  species (A) NOT DETECTED Final    CRITICAL RESULT CALLED TO, READ BACK BY AND VERIFIED WITH:    Comment: Vernelle Emerald [phone] BY GF   Staphylococcus aureus (BCID) (A) NOT DETECTED Final    CRITICAL RESULT CALLED TO, READ BACK BY AND VERIFIED WITH:    CommentVernelle Emerald PHARMD [phone] BY GF   Methicillin resistance DETECTED (A) NOT DETECTED Final    Comment: CRITICAL RESULT CALLED TO, READ BACK BY AND VERIFIED WITH: Arsenio Loader PHARMD [phone] BY GF    Streptococcus species NOT DETECTED NOT DETECTED Final   Streptococcus agalactiae NOT DETECTED NOT DETECTED Final   Streptococcus pneumoniae NOT DETECTED NOT DETECTED Final   Streptococcus pyogenes NOT DETECTED NOT DETECTED Final   Acinetobacter baumannii NOT DETECTED NOT DETECTED Final   Enterobacteriaceae species NOT DETECTED NOT DETECTED Final   Enterobacter cloacae complex NOT DETECTED NOT DETECTED Final   Escherichia coli NOT DETECTED NOT DETECTED Final   Klebsiella oxytoca NOT DETECTED NOT DETECTED Final   Klebsiella pneumoniae NOT DETECTED NOT DETECTED Final   Proteus species NOT DETECTED NOT DETECTED Final   Serratia marcescens NOT DETECTED NOT DETECTED Final   Haemophilus influenzae NOT DETECTED NOT DETECTED Final   Neisseria meningitidis NOT DETECTED NOT DETECTED Final   Pseudomonas aeruginosa NOT DETECTED NOT DETECTED Final   Candida albicans NOT DETECTED NOT DETECTED Final   Candida glabrata NOT DETECTED NOT DETECTED Final   Candida krusei NOT DETECTED  NOT DETECTED Final   Candida parapsilosis NOT DETECTED NOT DETECTED Final   Candida tropicalis NOT DETECTED NOT DETECTED Final    Comment: Performed at [facility], [place]. [address] [place], [place] [address]  Culture, blood (routine x 2)     Status: None   Collection Time: 05/03/18  6:02 PM  Result Value Ref Range Status   Specimen Description   Final    BLOOD LEFT HAND Performed at [facility] [address], [place], [place] [address]    Special Requests   Final    BOTTLES DRAWN AEROBIC AND ANAEROBIC Blood Culture adequate volume   Culture   Final    NO GROWTH 5 DAYS Performed at [facility], [place] [name], [place], [place] [address]    Report Status 05/08/2018 FINAL  Final  Culture, Urine     Status: None   Collection Time: 05/03/18  6:29 PM  Result Value Ref Range Status   Specimen Description   Final    URINE, CATHETERIZED Performed at [facility] [address] [place], [place] [address]    Special Requests   Final    NONE Performed at [facility], [place] [address] [place], [place] [address]    Culture   Final    NO GROWTH Performed at [facility], [place] [address] [place], [place] [address]    Report Status 05/05/2018 FINAL  Final  Culture, respiratory     Status: None   Collection Time: 05/04/18  1:50 AM  Result Value Ref Range Status   Specimen Description TRACHEAL ASPIRATE  Final   Special Requests NONE  Final   Gram Stain   Final    ABUNDANT WBC PRESENT, PREDOMINANTLY PMN NO ORGANISMS SEEN    Culture   Final    Consistent with normal respiratory flora. Performed at [facility], [place] [address] [place], [place] [address]    Report Status 05/06/2018 FINAL  Final  Culture,  blood (Routine X 2) w Reflex to ID Panel     Status: None (Preliminary result)   Collection Time: 05/06/18 12:00 PM  Result Value Ref Range Status   Specimen Description BLOOD RIGHT ANTECUBITAL  Final    Special Requests   Final    BOTTLES DRAWN AEROBIC ONLY Blood Culture results may not be optimal due to an inadequate volume of blood received in culture bottles   Culture   Final    NO GROWTH 2 DAYS Performed at [facility] [address] [place], [place] [address]    Report Status PENDING  Incomplete  Culture, blood (Routine X 2) w Reflex to ID Panel     Status: None (Preliminary result)   Collection Time: 05/06/18 12:05 PM  Result Value Ref Range Status   Specimen Description BLOOD RIGHT ANTECUBITAL  Final   Special Requests   Final    BOTTLES DRAWN AEROBIC ONLY Blood Culture results may not be optimal due to an inadequate volume of blood received in culture bottles   Culture   Final    NO GROWTH 2 DAYS Performed at [facility], [place] [address] [place], [place] [address]    Report Status PENDING  Incomplete     Terri Piedra, [place] for [place] [phone] Pager  05/08/2018  12:06 PM

## 2018-05-08 NOTE — Progress Notes (Signed)
This note also relates to the following rows which could not be included: Pulse Rate - Cannot attach notes to unvalidated device data SpO2 - Cannot attach notes to unvalidated device data  Pt back on full vent support of previously documented settings due to sustained desaturation with bath.

## 2018-05-08 NOTE — Telephone Encounter (Signed)
-----   Message from Ulyses Amor, [place] sent at 05/08/2018  8:10 AM EST -----  S/P left LE fasciotomy f/u with Dr. Oneida Alar in 2weeks

## 2018-05-08 NOTE — Progress Notes (Addendum)
[facility]  Subjective  - Doing well over all responds to me with appropriate head nods and facial expressions.   Objective 111/63 (!) 102 99 F (37.2 C) (Axillary) (!) 28 94%  Intake/Output Summary (Last 24 hours) at 05/08/2018 0803 Last data filed at 05/08/2018 0700 Gross per 24 hour  Intake 1672.59 ml  Output 2785 ml  Net -1112.41 ml    Left LE active range of motion intact, foot warm to touch well perfused with intact sensation. Dry dressing removed, incision are healing well.  Lateral SS drainage.  Assessment/Planning: POD # 2 Closure of left lower extremity medial and lateral fasciotomy incisions  Incision healing well.  Motor and sensation intact. Dry dressing PRN to left LE until drainage stops. F/U with [name] in 2 weeks for suture removal.  Roxy Horseman 05/08/2018 8:03 AM -- Agree with above.  Will sign off.  Call if questions  Ruta Hinds, MD [facility] Office: [phone] Pager: [phone]  Laboratory Lab Results: No results for input(s): WBC, HGB, HCT, PLT in the last 72 hours. BMET Recent Labs    05/07/18 0611 05/08/18 0446  NA 141 139  K 4.1 4.0  CL 113* 111  CO2 19* 21*  GLUCOSE 171* 93  BUN 42* 43*  CREATININE 4.17* 4.02*  CALCIUM 7.9* 8.7*    COAG Lab Results  Component Value Date   INR 1.13 08/12/2014   INR 0.96 11/09/2011   INR 0.95 08/30/2011   No results found for: PTT

## 2018-05-08 NOTE — Evaluation (Signed)
Physical Therapy Evaluation Patient Details Name: Melody Gray MRN: [id number] DOB: 07-08-1970 Today's Date: 05/08/2018   History of Present Illness  48 y.o. female admitted on 05/01/18 for cough, SOB, and loss of appetite.  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  Her course was complicated by PEA arrest/code on 05/03/18 had to be ventilated (difficult airway despite already having a trach in place (it was cuffless) also found to have pulseless L lower extremity.  Pt dx with compartment syndrome and underwent faciotomy on 05/03/18 (left open with wound vac), faciotomies closed on 05/06/18.  She continues to be on/off the ventilator as of 05/08/18.    Clinical Impression  Limited bed level session today due to unstable trach site. RT advised no EOB or OOB mobility until a real trach can be put in as her current pseudo-trach is very easily dislodged.  LE HEP program given and practiced with pt and I was able to get her home set up information from her.   PT to follow acutely for deficits listed below.  She may be appropriate for post acute rehab depending on her progress. Medbridge HEP: [id number]   Follow Up Recommendations CIR    Equipment Recommendations  Rolling walker with 5" wheels    Recommendations for Other Services Rehab consult     Precautions / Restrictions Precautions Precautions: Other (comment) Precaution Comments: very unstable trach/pseudo trach.  Pt needs to have a real trach in place before mobility.              Pertinent Vitals/Pain Pain Assessment: Faces Faces Pain Scale: Hurts even more Pain Location: left lower leg and R dorsum of foot Pain Descriptors / Indicators: Aching;Burning Pain Intervention(s): Limited activity within patient's tolerance;Monitored during session;Repositioned    Home Living Family/patient expects to be discharged to:: Private residence Living Arrangements: Children(son and daughter, but son in in  school) Available Help at Discharge: Family;Available PRN/intermittently Type of Home: House Home Access: Stairs to enter Entrance Stairs-Rails: Left Entrance Stairs-Number of Steps: 4 Home Layout: One level Home Equipment: Bedside commode;Tub bench      Prior Function Level of Independence: Independent               Hand Dominance   Dominant Hand: Right    Extremity/Trunk Assessment   Upper Extremity Assessment Upper Extremity Assessment: Overall WFL for tasks assessed(NT as to not disturb the vent tubing)    Lower Extremity Assessment Lower Extremity Assessment: LLE deficits/detail LLE Deficits / Details: left leg with expected post op pain and weakness.  Ankle 3-/5, knee 3-/5, hip flexion 3-/5 LLE Sensation: WNL(intact to LT)    Cervical / Trunk Assessment Cervical / Trunk Assessment: Normal  Communication   Communication: Tracheostomy  Cognition Arousal/Alertness: Awake/alert Behavior During Therapy: WFL for tasks assessed/performed Overall Cognitive Status: Within Functional Limits for tasks assessed                                 General Comments: Despite prolonged code, she does not show signs of anoxic BI.  She is clear, follows commands without delay and is oriented.       General Comments      Exercises General Exercises - Lower Extremity Ankle Circles/Pumps: AAROM;Both;20 reps Quad Sets: AROM;Both;10 reps Heel Slides: AROM;Both;10 reps Hip ABduction/ADduction: AROM;Both;10 reps Straight Leg Raises: AROM;Both;10 reps Other Exercises Other Exercises: HEP handout given, encouraged 3-5 sessions of  LE HEP per day.   Assessment/Plan    PT Assessment Patient needs continued PT services  PT Problem List Decreased strength;Decreased activity tolerance;Decreased range of motion;Decreased balance;Decreased mobility;Decreased knowledge of use of DME;Decreased knowledge of precautions;Cardiopulmonary status limiting activity;Pain;Obesity        PT Treatment Interventions DME instruction;Gait training;Stair training;Functional mobility training;Therapeutic exercise;Therapeutic activities;Balance training;Patient/family education;Manual techniques;Modalities    PT Goals (Current goals can be found in the Care Plan section)  Acute Rehab PT Goals Patient Stated Goal: to get up OOB  PT Goal Formulation: With patient Time For Goal Achievement: 05/22/18 Potential to Achieve Goals: Good    Frequency Min 3X/week    AM-PAC PT "6 Clicks" Mobility  Outcome Measure Help needed turning from your back to your side while in a flat bed without using bedrails?: A Little Help needed moving from lying on your back to sitting on the side of a flat bed without using bedrails?: A Little Help needed moving to and from a bed to a chair (including a wheelchair)?: A Lot Help needed standing up from a chair using your arms (e.g., wheelchair or bedside chair)?: A Lot Help needed to walk in hospital room?: A Lot Help needed climbing 3-5 steps with a railing? : Total 6 Click Score: 13    End of Session Equipment Utilized During Treatment: Oxygen;Other (comment)(ventilator via pseudotrach, PRVC FiO2 40%, PEEP5) Activity Tolerance: Other (comment)(limited by difficult airway) Patient left: in bed;with call bell/phone within reach Nurse Communication: Mobility status PT Visit Diagnosis: Muscle weakness (generalized) (M62.81);Difficulty in walking, not elsewhere classified (R26.2);Pain Pain - Right/Left: Left Pain - part of body: Leg    Time: 0051-1021 PT Time Calculation (min) (ACUTE ONLY): 30 min   Charges:           Wells Guiles B. Ransome Helwig, PT, DPT  Acute Rehabilitation #[phone] pager #[phone] office   PT Evaluation $PT Eval Moderate Complexity: 1 Mod PT Treatments $Therapeutic Exercise: 8-22 mins       05/08/2018, 5:07 PM

## 2018-05-09 DIAGNOSIS — R198 Other specified symptoms and signs involving the digestive system and abdomen: Secondary | ICD-10-CM

## 2018-05-09 DIAGNOSIS — R0789 Other chest pain: Secondary | ICD-10-CM

## 2018-05-09 LAB — GLUCOSE, CAPILLARY
Glucose-Capillary: 98 mg/dL (ref 70–99)
Glucose-Capillary: 87 mg/dL (ref 70–99)
Glucose-Capillary: 138 mg/dL — ABNORMAL HIGH (ref 70–99)
Glucose-Capillary: 200 mg/dL — ABNORMAL HIGH (ref 70–99)
Glucose-Capillary: 221 mg/dL — ABNORMAL HIGH (ref 70–99)
Glucose-Capillary: 232 mg/dL — ABNORMAL HIGH (ref 70–99)

## 2018-05-09 LAB — CBC
HCT: 28.1 % — ABNORMAL LOW (ref 36.0–46.0)
Hemoglobin: 8.6 g/dL — ABNORMAL LOW (ref 12.0–15.0)
MCH: 28.6 pg (ref 26.0–34.0)
MCHC: 30.6 g/dL (ref 30.0–36.0)
MCV: 93.4 fL (ref 80.0–100.0)
Platelets: 221 10*3/uL (ref 150–400)
RBC: 3.01 MIL/uL — ABNORMAL LOW (ref 3.87–5.11)
RDW: 13 % (ref 11.5–15.5)
WBC: 8.7 10*3/uL (ref 4.0–10.5)
nRBC: 0 % (ref 0.0–0.2)

## 2018-05-09 MED ORDER — FAMOTIDINE 20 MG PO TABS
40.0000 mg | ORAL_TABLET | Freq: Every day | ORAL | Status: DC
  Administered 2018-05-09 – 2018-05-11 (×3): 40 mg via ORAL
  Filled 2018-05-09 (×3): qty 2

## 2018-05-09 MED ORDER — LIDOCAINE 5 % EX PTCH
2.0000 | MEDICATED_PATCH | CUTANEOUS | Status: DC
  Administered 2018-05-09: 1 via TRANSDERMAL
  Administered 2018-05-10 – 2018-05-18 (×9): 2 via TRANSDERMAL
  Filled 2018-05-09 (×12): qty 2

## 2018-05-09 MED ORDER — FUROSEMIDE 10 MG/ML IJ SOLN
80.0000 mg | Freq: Two times a day (BID) | INTRAMUSCULAR | Status: AC
  Administered 2018-05-09 (×2): 80 mg via INTRAVENOUS
  Filled 2018-05-09 (×2): qty 8

## 2018-05-09 MED ORDER — HEPARIN SODIUM (PORCINE) 5000 UNIT/ML IJ SOLN
5000.0000 [IU] | Freq: Three times a day (TID) | INTRAMUSCULAR | Status: DC
  Administered 2018-05-09 – 2018-05-12 (×9): 5000 [IU] via SUBCUTANEOUS
  Filled 2018-05-09 (×9): qty 1

## 2018-05-09 MED ORDER — POLYETHYLENE GLYCOL 3350 17 G PO PACK
17.0000 g | PACK | Freq: Every day | ORAL | Status: DC
  Administered 2018-05-09 – 2018-05-11 (×3): 17 g via ORAL
  Filled 2018-05-09 (×3): qty 1

## 2018-05-09 MED ORDER — FLEET ENEMA 7-19 GM/118ML RE ENEM: 1.0000 | ENEMA | Freq: Once | RECTAL | Status: DC

## 2018-05-09 NOTE — Progress Notes (Signed)
This note also relates to the following rows which could not be included: Pulse Rate - Cannot attach notes to unvalidated device data SpO2 - Cannot attach notes to unvalidated device data  Pt is on ATC. No distress noted

## 2018-05-09 NOTE — Procedures (Signed)
Tracheostomy change  The current #7 ETT was removed w/out difficulty.  Her usual size 4 cuffless was replaced w/out difficulty.  Pt tolerated well Excellent phonation w/ PMV  [name] ACNP-BC [place] Pager # [phone] OR # [phone] if no answer

## 2018-05-09 NOTE — Progress Notes (Addendum)
NAME:  Melody Gray, MRN:  [id number], DOB:  04/07/1971, LOS: 8 ADMISSION DATE:  04/30/2018, CONSULTATION DATE:  05/03/18 REFERRING MD:  Dr. Valeta Harms, CHIEF COMPLAINT:  Cardiac arrest    Brief History   48 y/o F with sarcoidosis & chronic tracheostomy in the setting of OHS admitted 12/31 with AKI and hyperkalemia.  Developed PEA arrest after admit requiring CPR ~ 40 minutes.  She required an emergent trach change during the arrest for ventilation which was unsuccessful.  ETT intubation was unsuccessful as well.  ENT placed an ETT via her tracheostomy stoma.  She was then found to have a pulseless left extremity and was placed on anticoagulation.  Tx to [facility] for VVS evaluation.    Past Medical History  Sarcoidosis - lung, liver involvement; OSA/OHS s/p trach  Pulmonary HTN - pa peak 83 on ECHO 07/2014; HTN; DM  CKD II; NICM - LVEF 55-60% in 07/2014   Significant Hospital Events   12/31  Admit with AKI, hyperkalemia  01/03  PEA arrest, 40 min before ROSC.  DIFFICULT AIRWAY.  01/04  ENT created new trach tube out of #7 ETT  1/6 back to OR for closure of LLE. Hemodynamics improved.  1/7: Hemodynamically stable remains off pressors.  Echocardiogram pending for evaluation of bacteremia blood cultures pending.  Fully awake.  Starting diuresis, working on weaning. 1/9: Echocardiogram without evidence of vegetation.  Continues to improve.  Renal function improved.  Work of breathing improved.  Tolerating pressure support ventilation.  Anxious to initiate trach collar Consults:  VVS  Procedures:  L I-O 1/3 >> 1/3  ALine 1/3 >>  TLC 1/3 >>  ETT via Stoma 1/3 >>   Significant Diagnostic Tests:  CT ABD/Pelvis 1/3 >> large LLL PNA, probable RML as well, hepatic cirrhosis, stable 3.1 cm low density in midpole of left kidney ECHO 1/7>>> Micro Data:  BCx2 1/3 >> 1/2 with GPC's >> MRSA  Tracheal aspirate 1/4 >>  UC 1/3 >>   Antimicrobials:  Levaquin 1/3 >> 1/3  Cefepime 1/3 >>  linezolid  1/3>>>  Interim history/subjective:  Denies pain.  Objective   Blood pressure (Abnormal) 148/71, pulse 94, temperature 99.2 F (37.3 C), temperature source Oral, resp. rate 14, height 5\' 1"  (1.549 m), weight 105.9 kg, SpO2 97 %.    Vent Mode: PSV;CPAP FiO2 (%):  [40 %] 40 % Set Rate:  [28 bmp] 28 bmp Vt Set:  [420 mL] 420 mL PEEP:  [5 cmH20] 5 cmH20 Pressure Support:  [8 cmH20-10 cmH20] 8 cmH20 Plateau Pressure:  [18 cmH20-24 cmH20] 24 cmH20   Intake/Output Summary (Last 24 hours) at 05/09/2018 0919 Last data filed at 05/09/2018 0800 Gross per 24 hour  Intake 902.64 ml  Output 3475 ml  Net -2572.36 ml   Filed Weights   05/07/18 0600 05/08/18 0500 05/09/18 0500  Weight: 109.2 kg 107.7 kg 105.9 kg    Examination:  General: This a pleasant 48 year old female patient currently resting on pressure support ventilation and in no acute distress HEENT normocephalic atraumatic facial edema improving fashioned tracheostomy from size 7 endotracheal tube remains unremarkable.  There were issues with requiring repositioning last night, currently stable. Pulmonary: Scattered rhonchi diminished bases excellent tidal volume on pressure support of 8 no accessory use Cardiac: Regular rate and rhythm without murmur rub or gallop Abdomen: Soft nontender no organomegaly Extremities: Left lower extremity post fasciotomy warm, strong pulses. Neuro: Awake, oriented, writing notes, moves all extremities, no focal deficits.  Resolved Hospital Problem list  PEA arrest 1/3 Cardiogenic shock  Assessment & Plan:   Acute on chronic hypoxic respiratory failure in the setting of volume overload, and acute on chronic renal failure.  Superimposed on underlying advanced sarcoidosis, and sleep apnea -She has been tracheostomy dependent for several years secondary to difficult airway and prior traumatic intubation -Tolerating pressure support ventilation, she is anxious to return to trach collar Plan Trach  collar trials today as tolerated Leaving ventilator at bedside and available PRN Keep current tracheostomy in place, there is a backup in the ventilator if needed If tolerating trach collar without difficulty we can transition back to cuffless trach on 1/10 Continue to push diuresis Get her out of bed Treat pneumonia Lidoderm patch for pain (CPR related)  Probable aspiration pneumonia versus hcap Portable chest x-ray personally reviewed 1/7:Tracheostomy placement satisfactory.  Right greater than left airspace disease.  Likely element of edema/pneumonia Plan  Now day number 7 of cefepime, will discontinue after today  Chronic trach dependence  Plan Eventually will go back to size 4 cuffless trach  MRSA bacteremia (1 of 3 bottles) -No evidence of valvular vegetation on echo Plan Follow-up blood cultures Continuing current Zyvox, anticipating 2-week total course assuming next blood cultures negative  Left Leg Compartment Syndrome s/p IO device requiring surgical intervention, status post closure in OR 1/6 P: Postoperative care per vascular surgery  Pulmonary HTN -in setting of sarcoidosis  P: Continuing supportive care Diuresis Treat hypoxia   Acute on chronic renal failure w/ worsening NAG metabolic acidosis -Renal function continuing to improve P Continue Lasix 80 mg twice daily Aiming towards negative fluid balance Follow-up a.m. chemistry  Constipation: no BP for 6 days P: mirilax and fleet today   Hepatic Cirrhosis prob 2/2 chronic PH P: Continue to maintain hemodynamics  Best practice:  Diet: NPO Pain/Anxiety/Delirium protocol (if indicated): Fentanyl for pain VAP protocol (if indicated): in place  DVT prophylaxis: SCD; change to [place] heparin  GI prophylaxis: pepcid  Glucose control: SSI Mobility: bed rest  Code Status: Full code  Family Communication: Sister Justice Rocher, works in food services at [employer]) updated at bedside 1/4.family updated at bedside    Disposition: She remains critically ill due to her need for constant titration of ventilatory support, as well as current airway fashion from endotracheal tube acting as tracheostomy.  I am hopeful she can transition to trach collar in the next 24 hours/today when she can tolerate trach collar for 24 to 48 hours we can transition to stepdown unit and consider more rehabilitative focus.  [name] ACNP-BC [place] Pager # [phone] OR # [phone] if no answer

## 2018-05-09 NOTE — Progress Notes (Signed)
eLink Physician-Brief Progress Note Patient Name: Melody Gray DOB: Nov 11, 1970 MRN: [id number]   Date of Service  05/09/2018  HPI/Events of Note  RN reported that endotracheal tube through the tracheostomy has been popping multiple times overnight, requiring repositioning by the RN.  Pt has not been tolerating trache collar well.  eICU Interventions  Pt will need a more stable airway.  Reconsult ENT for tracheostomy tube replacement.     Intervention Category Major Interventions: Respiratory failure - evaluation and management  Elsie Lincoln 05/09/2018, 5:21 AM

## 2018-05-09 NOTE — Progress Notes (Signed)
[facility]  Date of Admission:  04/30/2018     Total days of antibiotics 7         ASSESSMENT/PLAN  [name] continues to receive treatment for pneumonia and MRSA bacteremia having completed 7 days of therapy for pneumonia with cefepime. She appears clinically improved and tolerating trach collar today without fevers, cough or shortness of breath. [name] has completed the recommended therapy for pneumonia and will stop Cefepime today. Repeat blood cultures without growth to date with MRSA bacteremia likely being transient in 1/4 bottles. Would recommend treating for total of 14 days with linezolid.  1. Discontinue cefepime at end of the day today.  2. Continue linezolid 05/17/17. May switch to oral when able to take medications.  3. No additional ID follow up needed.  ID will sign off and be available as needed for the remainder of her hospitalization.    Principal Problem:   MRSA bacteremia Active Problems:   Pneumonia   Lesion of liver and cirrhosis on imaging   Insulin-requiring or dependent type II diabetes mellitus (Melody Gray)   Hypertension complicating diabetes (Melody Gray)   Acute on chronic respiratory failure with hypoxemia (HCC)   Acute kidney injury (AKI) with acute tubular necrosis (ATN) (HCC)   Acute renal failure superimposed on stage 4 chronic kidney disease (HCC)   Chronic respiratory failure with hypoxia (HCC)/ trach dep   Hyperkalemia   Chronic pulmonary hypertension (HCC)   Cardiac arrest (HCC)   Nontraumatic compartment syndrome of left lower extremity   . chlorhexidine gluconate (MEDLINE KIT)  15 mL Mouth Rinse BID  . famotidine  20 mg Per Tube QHS  . feeding supplement (PRO-STAT SUGAR FREE 64)  30 mL Per Tube Daily  . fluticasone  1 spray Each Nare Daily  . free water  200 mL Per Tube Q8H  . furosemide  80 mg Intravenous Q12H  . heparin injection (subcutaneous)  5,000 Units Subcutaneous Q8H  . insulin aspart  0-20 Units  Subcutaneous Q4H  . insulin detemir  20 Units Subcutaneous QHS  . lidocaine  2 patch Transdermal Q24H  . mouth rinse  15 mL Mouth Rinse 10 times per day  . polyethylene glycol  17 g Oral Daily  . sodium chloride flush  3 mL Intravenous Q12H    SUBJECTIVE:  Afebrile overnight with no leukocytosis. Repeat cultures have been without growth to date. No acute events overnight.   Doing well. Denies fevers, chills, or cough. Does have chest pain from where she received CPR.   Allergies  Allergen Reactions  . Vicodin [Hydrocodone-Acetaminophen] Itching     Review of Systems: Review of Systems  Constitutional: Negative for chills, fever and weight loss.  Respiratory: Negative for cough, shortness of breath and wheezing.   Cardiovascular: Positive for chest pain (Chest wall pain). Negative for leg swelling.  Gastrointestinal: Negative for abdominal pain, constipation, diarrhea, nausea and vomiting.  Skin: Negative for rash.      OBJECTIVE: Vitals:   05/09/18 0802 05/09/18 0900 05/09/18 1000 05/09/18 1030  BP:  (!) 152/82 (!) 165/153   Pulse:  90 95 99  Resp:    20  Temp:      TempSrc:      SpO2: 97% 96% 94% 96%  Weight:      Height:       Body mass index is 44.11 kg/m.  Physical Exam Constitutional:      General: She is not in acute distress.  Appearance: She is well-developed. She is obese. She is ill-appearing.  Cardiovascular:     Rate and Rhythm: Normal rate and regular rhythm.     Heart sounds: Normal heart sounds. No murmur.  Pulmonary:     Effort: Pulmonary effort is normal.     Breath sounds: Normal breath sounds. No stridor. No wheezing, rhonchi or rales.     Comments: On trach collar.  Abdominal:     General: Bowel sounds are normal. There is no distension.     Palpations: Abdomen is soft. There is no mass.     Tenderness: There is no abdominal tenderness.  Skin:    General: Skin is warm and dry.  Neurological:     Mental Status: She is alert and  oriented to person, place, and time.  Psychiatric:        Behavior: Behavior normal.        Thought Content: Thought content normal.        Judgment: Judgment normal.     Lab Results Lab Results  Component Value Date   WBC 8.7 05/09/2018   HGB 8.6 (L) 05/09/2018   HCT 28.1 (L) 05/09/2018   MCV 93.4 05/09/2018   PLT 221 05/09/2018    Lab Results  Component Value Date   CREATININE 4.02 (H) 05/08/2018   BUN 43 (H) 05/08/2018   NA 139 05/08/2018   K 4.0 05/08/2018   CL 111 05/08/2018   CO2 21 (L) 05/08/2018    Lab Results  Component Value Date   ALT 18 05/08/2018   AST 15 05/08/2018   ALKPHOS 112 05/08/2018   BILITOT 0.5 05/08/2018     Microbiology: Recent Results (from the past 240 hour(s))  MRSA PCR Screening     Status: None   Collection Time: 04/30/18 11:34 PM  Result Value Ref Range Status   MRSA by PCR NEGATIVE NEGATIVE Final    Comment:        The GeneXpert MRSA Assay (FDA approved for NASAL specimens only), is one component of a comprehensive MRSA colonization surveillance program. It is not intended to diagnose MRSA infection nor to guide or monitor treatment for MRSA infections. Performed at [facility], [place] [address] [place], [place] [address]   Culture, blood (routine x 2)     Status: Abnormal   Collection Time: 05/03/18  5:25 PM  Result Value Ref Range Status   Specimen Description BLOOD RIGHT ARM  Final   Special Requests   Final    BOTTLES DRAWN AEROBIC ONLY Blood Culture adequate volume Performed at [facility] [address], [place], [place] [address]    Culture  Setup Time   Final    GRAM POSITIVE COCCI AEROBIC BOTTLE ONLY CRITICAL RESULT CALLED TO, READ BACK BY AND VERIFIED WITH: M [place] [id number] BY GF    Culture METHICILLIN RESISTANT STAPHYLOCOCCUS AUREUS (A)  Final   Report Status 05/06/2018 FINAL  Final   Organism ID, Bacteria METHICILLIN RESISTANT STAPHYLOCOCCUS AUREUS  Final        Susceptibility   Methicillin resistant staphylococcus aureus - MIC*    CIPROFLOXACIN <=0.5 SENSITIVE Sensitive     ERYTHROMYCIN >=8 RESISTANT Resistant     GENTAMICIN <=0.5 SENSITIVE Sensitive     OXACILLIN >=4 RESISTANT Resistant     TETRACYCLINE <=1 SENSITIVE Sensitive     VANCOMYCIN <=0.5 SENSITIVE Sensitive     TRIMETH/SULFA <=10 SENSITIVE Sensitive     CLINDAMYCIN <=0.25 SENSITIVE Sensitive  RIFAMPIN <=0.5 SENSITIVE Sensitive     Inducible Clindamycin NEGATIVE Sensitive     * METHICILLIN RESISTANT STAPHYLOCOCCUS AUREUS  Blood Culture ID Panel (Reflexed)     Status: Abnormal   Collection Time: 05/03/18  5:25 PM  Result Value Ref Range Status   Enterococcus species NOT DETECTED NOT DETECTED Final   Listeria monocytogenes NOT DETECTED NOT DETECTED Final   Staphylococcus species (A) NOT DETECTED Final    CRITICAL RESULT CALLED TO, READ BACK BY AND VERIFIED WITH:    Comment: Vernelle Emerald [id number] BY GF   Staphylococcus aureus (BCID) (A) NOT DETECTED Final    CRITICAL RESULT CALLED TO, READ BACK BY AND VERIFIED WITH:    CommentVernelle Emerald PHARMD [phone] BY GF   Methicillin resistance DETECTED (A) NOT DETECTED Final    Comment: CRITICAL RESULT CALLED TO, READ BACK BY AND VERIFIED WITH: Arsenio Loader PHARMD [id number] BY GF    Streptococcus species NOT DETECTED NOT DETECTED Final   Streptococcus agalactiae NOT DETECTED NOT DETECTED Final   Streptococcus pneumoniae NOT DETECTED NOT DETECTED Final   Streptococcus pyogenes NOT DETECTED NOT DETECTED Final   Acinetobacter baumannii NOT DETECTED NOT DETECTED Final   Enterobacteriaceae species NOT DETECTED NOT DETECTED Final   Enterobacter cloacae complex NOT DETECTED NOT DETECTED Final   Escherichia coli NOT DETECTED NOT DETECTED Final   Klebsiella oxytoca NOT DETECTED NOT DETECTED Final   Klebsiella pneumoniae NOT DETECTED NOT DETECTED Final   Proteus species NOT DETECTED NOT DETECTED Final   Serratia marcescens NOT  DETECTED NOT DETECTED Final   Haemophilus influenzae NOT DETECTED NOT DETECTED Final   Neisseria meningitidis NOT DETECTED NOT DETECTED Final   Pseudomonas aeruginosa NOT DETECTED NOT DETECTED Final   Candida albicans NOT DETECTED NOT DETECTED Final   Candida glabrata NOT DETECTED NOT DETECTED Final   Candida krusei NOT DETECTED NOT DETECTED Final   Candida parapsilosis NOT DETECTED NOT DETECTED Final   Candida tropicalis NOT DETECTED NOT DETECTED Final    Comment: Performed at [facility], [place]. [address], [place], [place] [address]  Culture, blood (routine x 2)     Status: None   Collection Time: 05/03/18  6:02 PM  Result Value Ref Range Status   Specimen Description   Final    BLOOD LEFT HAND Performed at [facility] [address] [place], [place] [address]    Special Requests   Final    BOTTLES DRAWN AEROBIC AND ANAEROBIC Blood Culture adequate volume   Culture   Final    NO GROWTH 5 DAYS Performed at [facility], [place] [address] [place], [place] [address]    Report Status 05/08/2018 FINAL  Final  Culture, Urine     Status: None   Collection Time: 05/03/18  6:29 PM  Result Value Ref Range Status   Specimen Description   Final    URINE, CATHETERIZED Performed at [facility] [address], [place], [place] [address]    Special Requests   Final    NONE Performed at [facility], [place] [address], [place], [place] [address]    Culture   Final    NO GROWTH Performed at [facility], [place] [address] [place], [place] [address]    Report Status 05/05/2018 FINAL  Final  Culture, respiratory     Status: None   Collection Time: 05/04/18  1:50 AM  Result Value Ref Range Status   Specimen Description  TRACHEAL ASPIRATE  Final   Special Requests NONE  Final   Gram Stain   Final    ABUNDANT WBC PRESENT, PREDOMINANTLY PMN NO ORGANISMS SEEN    Culture   Final    Consistent with normal respiratory  flora. Performed at [facility], [place] [address], [place], [place] [address]    Report Status 05/06/2018 FINAL  Final  Culture, blood (Routine X 2) w Reflex to ID Panel     Status: None (Preliminary result)   Collection Time: 05/06/18 12:00 PM  Result Value Ref Range Status   Specimen Description BLOOD RIGHT ANTECUBITAL  Final   Special Requests   Final    BOTTLES DRAWN AEROBIC ONLY Blood Culture results may not be optimal due to an inadequate volume of blood received in culture bottles   Culture   Final    NO GROWTH 3 DAYS Performed at [facility], [place] [address] [place], [place] [address]    Report Status PENDING  Incomplete  Culture, blood (Routine X 2) w Reflex to ID Panel     Status: None (Preliminary result)   Collection Time: 05/06/18 12:05 PM  Result Value Ref Range Status   Specimen Description BLOOD RIGHT ANTECUBITAL  Final   Special Requests   Final    BOTTLES DRAWN AEROBIC ONLY Blood Culture results may not be optimal due to an inadequate volume of blood received in culture bottles   Culture   Final    NO GROWTH 3 DAYS Performed at [facility], [place] [address], [place], [place] [address]    Report Status PENDING  Incomplete     Terri Piedra, [place] [facility] [place] Pager  05/09/2018  12:09 PM

## 2018-05-09 NOTE — Psychosocial Assessment (Signed)
Inpatient Rehabilitation Admissions Coordinator  Limited bed level therapy assessment. I will follow her progress to assist with planning dispo as she works with therapy more.  Danne Baxter, RN, MSN Rehab Admissions Coordinator [phone] 05/09/2018 8:00 AM

## 2018-05-10 ENCOUNTER — Inpatient Hospital Stay (HOSPITAL_COMMUNITY): Payer: Medicare Other

## 2018-05-10 LAB — COMPREHENSIVE METABOLIC PANEL
ALT: 17 U/L (ref 0–44)
AST: 19 U/L (ref 15–41)
Albumin: 2 g/dL — ABNORMAL LOW (ref 3.5–5.0)
Alkaline Phosphatase: 135 U/L — ABNORMAL HIGH (ref 38–126)
Anion gap: 13 (ref 5–15)
BUN: 46 mg/dL — ABNORMAL HIGH (ref 6–20)
CO2: 21 mmol/L — ABNORMAL LOW (ref 22–32)
Calcium: 8.5 mg/dL — ABNORMAL LOW (ref 8.9–10.3)
Chloride: 104 mmol/L (ref 98–111)
Creatinine, Ser: 3.34 mg/dL — ABNORMAL HIGH (ref 0.44–1.00)
GFR calc Af Amer: 18 mL/min — ABNORMAL LOW (ref >60)
GFR calc non Af Amer: 15 mL/min — ABNORMAL LOW (ref >60)
Glucose, Bld: 96 mg/dL (ref 70–99)
Potassium: 4.7 mmol/L (ref 3.5–5.1)
Sodium: 138 mmol/L (ref 135–145)
Total Bilirubin: 0.7 mg/dL (ref 0.3–1.2)
Total Protein: 7.1 g/dL (ref 6.5–8.1)

## 2018-05-10 LAB — GLUCOSE, CAPILLARY
Glucose-Capillary: 117 mg/dL — ABNORMAL HIGH (ref 70–99)
Glucose-Capillary: 120 mg/dL — ABNORMAL HIGH (ref 70–99)
Glucose-Capillary: 217 mg/dL — ABNORMAL HIGH (ref 70–99)
Glucose-Capillary: 218 mg/dL — ABNORMAL HIGH (ref 70–99)
Glucose-Capillary: 310 mg/dL — ABNORMAL HIGH (ref 70–99)

## 2018-05-10 MED ORDER — PREDNISONE 20 MG PO TABS
30.0000 mg | ORAL_TABLET | Freq: Every day | ORAL | Status: DC
  Administered 2018-05-10 – 2018-05-11 (×2): 30 mg via ORAL
  Filled 2018-05-10 (×2): qty 2

## 2018-05-10 MED ORDER — FUROSEMIDE 10 MG/ML IJ SOLN
80.0000 mg | Freq: Two times a day (BID) | INTRAMUSCULAR | Status: AC
  Administered 2018-05-10 (×2): 80 mg via INTRAVENOUS
  Filled 2018-05-10 (×2): qty 8

## 2018-05-10 MED ORDER — INSULIN ASPART 100 UNIT/ML ~~LOC~~ SOLN
0.0000 [IU] | Freq: Three times a day (TID) | SUBCUTANEOUS | Status: DC
  Administered 2018-05-10: 7 [IU] via SUBCUTANEOUS
  Administered 2018-05-10: 15 [IU] via SUBCUTANEOUS
  Administered 2018-05-11: 4 [IU] via SUBCUTANEOUS
  Administered 2018-05-11: 15 [IU] via SUBCUTANEOUS
  Administered 2018-05-11: 7 [IU] via SUBCUTANEOUS
  Administered 2018-05-11: 15 [IU] via SUBCUTANEOUS
  Administered 2018-05-12: 11 [IU] via SUBCUTANEOUS
  Administered 2018-05-12: 7 [IU] via SUBCUTANEOUS
  Administered 2018-05-12: 4 [IU] via SUBCUTANEOUS
  Administered 2018-05-12: 11 [IU] via SUBCUTANEOUS
  Administered 2018-05-14 (×2): 4 [IU] via SUBCUTANEOUS
  Administered 2018-05-14 – 2018-05-16 (×7): 3 [IU] via SUBCUTANEOUS
  Administered 2018-05-16 (×2): 4 [IU] via SUBCUTANEOUS
  Administered 2018-05-17: 3 [IU] via SUBCUTANEOUS
  Administered 2018-05-17: 4 [IU] via SUBCUTANEOUS
  Administered 2018-05-18: 3 [IU] via SUBCUTANEOUS

## 2018-05-10 MED ORDER — ORAL CARE MOUTH RINSE
15.0000 mL | Freq: Two times a day (BID) | OROMUCOSAL | Status: DC
  Administered 2018-05-10 – 2018-05-18 (×11): 15 mL via OROMUCOSAL

## 2018-05-10 MED ORDER — PREDNISONE 20 MG PO TABS: 30.0000 mg | ORAL_TABLET | Freq: Every day | ORAL | Status: DC

## 2018-05-10 NOTE — Progress Notes (Signed)
Nutrition Follow-up  DOCUMENTATION CODES:   Morbid obesity  INTERVENTION:    RD to encourage PO intake and protein rich foods  Provide MVI daily  NUTRITION DIAGNOSIS:   Inadequate oral intake related to inability to eat as evidenced by NPO status.  Resolved   GOAL:   Patient will meet greater than or equal to 90% of their needs  Not meeting-diet advanced   MONITOR:   Vent status, Skin, Weight trends, I & O's, Labs  REASON FOR ASSESSMENT:   Consult Enteral/tube feeding initiation and management  ASSESSMENT:   49 y.o., female with chronic trach, cirrhosis, CHF admitted on 04/30/2018 for acute renal failure, suffered cardiac arrest, PEA, with ROSC after ~40 mins, neuro intact after arrest, left LLE IO line, acute LLE compartment syndrome taken for fasciotomy.    1/3: four compartment fasciotomy, left lower leg 1/6 TF started; abd distended/pt vomited, TF stopped, closure LLE 1/9- ETT removed, trach replaced with baseline trach (size 4 cuffless), advanced to clear liquid diet  Pt off vent today. Tolerated clears yesterday and diet advanced to regular today. Pt eager to eat and is awaiting first food tray. Discussed supplementation options and the importance of protein. Pt does not wish to have supplements as she feels her appetite is fine.   PTA pt would eat one meal with multiple snacks daily. Her meals consisted of a meat with vegetables or a deli sandwich. She denies any wt loss PTA. Records indicate pt has maintained her wt over the last year.   Plan for transfer out of intensive care today.   UOP: 3795 ml x 24 hrs, weight down from 235 lb on 1/6 to 224 lb today  Medications reviewed and include: SSI, Levemir, miralax, prednisone, lasix-d/c Labs reviewed: CBG [id number]   Diet Order:   Diet Order            Diet regular Room service appropriate? Yes; Fluid consistency: Thin  Diet effective now              EDUCATION NEEDS:   Not appropriate for education  at this time  Skin:  Skin Assessment: Skin Integrity Issues: Skin Integrity Issues:: Incisions Incisions: L leg  Last BM:  05/09/2018- smear   Height:   Ht Readings from Last 1 Encounters:  05/05/18 5\' 1"  (1.549 m)    Weight:   Wt Readings from Last 1 Encounters:  05/10/18 101.7 kg    Ideal Body Weight:  47.7 kg  BMI:  Body mass index is 42.36 kg/m.  Estimated Nutritional Needs:   Kcal:  1500-1700 kcal   Protein:  75-90 grams  Fluid:  >/= 1.5 L/day  Mariana Single RD, LDN Clinical Nutrition Pager # - [phone]

## 2018-05-10 NOTE — Consult Note (Signed)
Physical Medicine and Rehabilitation Consult Reason for Consult:  Decreased functional mobility Referring Physician:   Critical care   HPI: Melody Gray is a 48 y.o.right handed female with history of chronic kidney disease stage IV,obesity, insulin-dependent diabetes mellitus, sarcoidosis with pulmonary and hepatic involvement as well as chronic respiratory failure with chronic tracheostomy. Per chart review and patient, patient lives with son and daughter. Question assistance on discharge. One level home 4 steps to entry. Reportedly independent prior to admission. Presented 04/30/2018 with cough, shortness of breath or loss of appetite 2 weeks. Chest x-ray notable for cardiomegaly with pulmonary vascular congestion. Creatinine 3.23 up from 2.7 baseline. Patient did receive all his of IV fluids. Patient develop PEA arrest after admission requiring CPR 40 minutes. She required emergent trach change during arrest for ventilation which was unsuccessful. ETT intubation was unsuccessful as well. ENT placed in ETT via Melody Gray tracheostomy stoma. Echocardiogram performed showing no vegetation. Blood cultures 1 of 3 MRSA bacteremia completing a 7 day course of Zyvox. Patient maintained on contact precautions. Noted compartment syndrome left lower extremity required 4 compartment fasciotomy 05/03/2018 with closure 05/06/2018. Follow-up renal services for elevated creatinine from baseline 2.4 likely secondary to shock and ATN no need for dialysis at this time. Subcutaneous heparin for DVT prophylaxis. Tolerating a regular diet. Patient's latest trach change #4 cuffless 05/10/2018. Therapy evaluations completed and ongoing with recommendations of physical medicine rehabilitation consult.   Review of Systems  Constitutional: Negative for chills.  HENT: Negative for hearing loss.   Eyes: Negative for blurred vision and double vision.  Respiratory: Positive for cough, sputum production and shortness  of breath.   Cardiovascular: Positive for leg swelling.  Gastrointestinal: Positive for constipation. Negative for nausea and vomiting.  Genitourinary: Negative for dysuria, flank pain and hematuria.  Musculoskeletal: Positive for joint pain and myalgias.  Skin: Negative for rash.  Neurological: Positive for focal weakness.  All other systems reviewed and are negative.  Past Medical History:  Diagnosis Date  . Abnormal LFTs (liver function tests)    Liver U/S and exam c/w HSM. Hep B serology neg. but Hep C ab +, HIV neg. 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 Anemia   . Cardiomyopathy, nonischemic ([place])    a. Varying EF over the years - initially 35% in 09/2010. Normal cors 12/2010. b. Echo 07/2014: EF 55-60%, no RWMA, + diastolic flattening and systolic flattening c/w RV volume and pressure overload, mild LAE/RAE, mod dilated RV, mod TR, PASP severely increased at 17mmHg. c. RHC 07/2014: moderate pulmonary HTN likely WHO group 3 with markedly elevated CVP and relatively normal left sided pressures.  . Chronic diastolic CHF (congestive heart failure) ([place])   . Chronic respiratory failure ([place])    a. became O2 dependent in July 2011. b. She required trache placement in 07/2013 and has been followed by pulmonology as well.  . CKD (chronic kidney disease), stage II   . Complication of anesthesia    " difficult waking "  . Diabetes mellitus    insulin dependent  . Diabetic retinopathy    Right eye 2/11  . Essential hypertension   . Health maintenance examination    Mammogram 05/2010 Negative; Last Pap smear 03/2008; Last DM eye exam 2/11> mild non-proliferative diabetic retinopathy. OD  . Helicobacter pylori ab+ 05/2011   Pt was symptomatic and treatment planned for 05/2011  . Hx of cardiac cath 2/08  No CAD, no RAS,  normal EF  . Hypoxemia    CT angiogram 9/11>> No PE; PFTs 10/11- FEV1 1.20 (49%) with 16% better p B2, DLCO 33%> corrects to  84; O2 sats ok on 4 lpm X rapid walk X 3 laps 05/2010  . Long term current use of systemic steroids   . Morbid obesity ([place])    Target wt= 153 for BMI <30  . Obesity hypoventilation syndrome ([place])   . Pulmonary hypertension ([place])   . QT prolongation   . Sarcoidosis    Followed by Dr. Melvyn Novas; w/ liver involvement per biopsy 12/09, Reversible airway component so started on [facility] 01/2010; HFA 75% p coaching 05/2010  . Seborrheic dermatitis of scalp   . Sleep apnea   . Tobacco abuse    Past Surgical History:  Procedure Laterality Date  . BREAST SURGERY    . CESAREAN SECTION    . FASCIOTOMY Left 05/03/2018   Procedure: FOUR COMPARTMENT FASCIOTOMY, LEFT LOWER LEG;  Surgeon: [name], MD;  Location: [place];  Service: Vascular;  Laterality: Left;  . FASCIOTOMY CLOSURE Left 05/06/2018   Procedure: FASCIOTOMY CLOSURE OF LEFT LEG;  Surgeon: Marty Heck, MD;  Location: [place];  Service: Vascular;  Laterality: Left;  . RIGHT HEART CATHETERIZATION N/A 08/12/2014   Procedure: RIGHT HEART CATH;  Surgeon: Jolaine Artist, MD;  Location: [facility] CATH LAB;  Service: Cardiovascular;  Laterality: N/A;  . TRACHEOSTOMY TUBE PLACEMENT N/A 08/10/2013   Procedure: TRACHEOSTOMY;  Surgeon: Melissa Montane, MD;  Location: [facility] OR;  Service: ENT;  Laterality: N/A;  . TUBAL LIGATION     Family History  Problem Relation Age of Onset  . Cancer Mother        colon cancer  . Multiple sclerosis Father   . Diabetes Father   . Asthma Sister        in childhood  . Hypertension Other    Social History:  reports that she has been smoking cigarettes. She started smoking about 3 years ago. She has a 6.60 pack-year smoking history. She has never used smokeless tobacco. She reports that she does not drink alcohol or use drugs. Allergies:  Allergies  Allergen Reactions  . Vicodin [Hydrocodone-Acetaminophen] Itching   Medications Prior to Admission  Medication Sig Dispense Refill  . amLODipine (NORVASC) 10 MG tablet  Take 1 tablet (10 mg total) by mouth daily. 30 tablet 11  . carvedilol (COREG) 6.25 MG tablet Take 1 tablet (6.25 mg total) by mouth 2 (two) times daily. 180 tablet 2  . glipiZIDE (GLUCOTROL) 5 MG tablet Take 0.5 tablets (2.5 mg total) by mouth daily before breakfast. 30 tablet 1  . insulin detemir (LEVEMIR) 100 UNIT/ML injection ADMINISTER 50 UNITS UNDER THE SKIN AT BEDTIME 40 mL 1  . liraglutide (VICTOZA) 18 MG/3ML SOPN Inject 0.3 mLs (1.8 mg total) into the skin daily. 9 mL 11  . Pseudoephedrine-DM-GG (ROBITUSSIN COLD & COUGH PO) Take 15 mLs by mouth daily as needed (cough).    . ranitidine (ZANTAC) 150 MG tablet Take 1 tablet (150 mg total) by mouth 2 (two) times daily. 60 tablet 12  . atorvastatin (LIPITOR) 40 MG tablet Take 1 tablet (40 mg total) by mouth daily. (Patient not taking: Reported on 04/30/2018) 30 tablet 11  . BD INSULIN SYRINGE U/F 30G X 1/2" 0.5 ML MISC USE AS DIRECTED 200 each 1  . glucose blood test strip Use to check blood sugar 3 times daily. Dx code E11.65. Insulin dependent  100 each 11  . Insulin Pen Needle (B-D UF III MINI PEN NEEDLES) 31G X 5 MM MISC USE TWICE A DAY 200 each 0    Home: Home Living Family/patient expects to be discharged to:: Private residence Living Arrangements: Children(son and daughter, but son in in school) Available Help at Discharge: Family, Available PRN/intermittently Type of Home: House Home Access: Stairs to enter Technical brewer of Steps: 4 Entrance Stairs-Rails: Left Home Layout: One level Bathroom Shower/Tub: Chiropodist: Standard Home Equipment: Bedside commode, Tub bench  Functional History: Prior Function Level of Independence: Independent Functional Status:  Mobility: Bed Mobility Overal bed mobility: Needs Assistance Bed Mobility: Supine to Sit Supine to sit: Min assist, HOB elevated General bed mobility comments: Min hand held assist to come to EOB.  HOB ~30 degrees, pt using railing as  well Transfers Overall transfer level: Needs assistance Equipment used: Rolling walker (2 wheeled) Transfer via Lift Equipment: Stedy Transfers: Sit to/from Stand, [employer] Transfers Sit to Stand: Mod assist, +2 physical assistance Stand pivot transfers: (used steady standing frame) General transfer comment: Pt was able to stand EOB with RW and two person mod assist, wide BOS, painful bil legs/feet and pt hyperextended at both knees relying heavily on RW and support of lower legs on bed.  Unable to take pivotal steps safely at this time.  Swiched out RW for steady standing frame and pt was able to stand again and get to the chair via standing frame.  Ambulation/Gait General Gait Details: unable at this time.      ADL:    Cognition: Cognition Overall Cognitive Status: Within Functional Limits for tasks assessed Orientation Level: (P) Oriented X4 Cognition Arousal/Alertness: Awake/alert Behavior During Therapy: WFL for tasks assessed/performed Overall Cognitive Status: Within Functional Limits for tasks assessed General Comments: Despite prolonged code, she does not show signs of anoxic BI.  She is clear, follows commands without delay and is oriented.   Blood pressure 121/73, pulse 95, temperature 99.1 F (37.3 C), temperature source Oral, resp. rate 16, height 5\' 1"  (1.549 m), weight 100.2 kg, SpO2 96 %. Physical Exam  Constitutional: She appears well-developed.  Obese  HENT:  Head: Normocephalic and atraumatic.  Eyes: EOM are normal. Right eye exhibits no discharge. Left eye exhibits no discharge.  Neck:  + Trach  Cardiovascular: Regular rhythm.  Tachycardia  Respiratory: Effort normal and breath sounds normal.  + [place]  GI: Soft. Bowel sounds are normal.  Musculoskeletal:     Comments: Left lower extremity with edema and tenderness  Neurological: She is alert.  Follows commands. Awareness of deficits Motor: Bilateral upper extremities: 5/5 proximal distal Right lower  extremity: 4+/5 proximal distal Left lower extremity: Hip flexion 4-4+/5, knee extension (limited due to pain) Ankle dorsiflexion 4/5  Skin:  Left lower extremity with dressing C/D/I  Psychiatric: She has a normal mood and affect. Melody Gray behavior is normal.    Results for orders placed or performed during the hospital encounter of 04/30/18 (from the past 24 hour(s))  Glucose, capillary     Status: Abnormal   Collection Time: 05/10/18 12:14 PM  Result Value Ref Range   Glucose-Capillary 217 (H) 70 - 99 mg/dL  Glucose, capillary     Status: Abnormal   Collection Time: 05/10/18  4:06 PM  Result Value Ref Range   Glucose-Capillary 218 (H) 70 - 99 mg/dL  Glucose, capillary     Status: Abnormal   Collection Time: 05/10/18  9:54 PM  Result Value Ref Range  Glucose-Capillary 310 (H) 70 - 99 mg/dL  Basic metabolic panel     Status: Abnormal   Collection Time: 05/11/18  6:20 AM  Result Value Ref Range   Sodium 137 135 - 145 mmol/L   Potassium 4.9 3.5 - 5.1 mmol/L   Chloride 101 98 - 111 mmol/L   CO2 23 22 - 32 mmol/L   Glucose, Bld 187 (H) 70 - 99 mg/dL   BUN 54 (H) 6 - 20 mg/dL   Creatinine, Ser 3.43 (H) 0.44 - 1.00 mg/dL   Calcium 8.6 (L) 8.9 - 10.3 mg/dL   GFR calc non Af Amer 15 (L) >60 mL/min   GFR calc Af Amer 17 (L) >60 mL/min   Anion gap 13 5 - 15  Glucose, capillary     Status: Abnormal   Collection Time: 05/11/18  7:33 AM  Result Value Ref Range   Glucose-Capillary 180 (H) 70 - 99 mg/dL   Comment 1 Notify RN    Comment 2 Document in Chart   CBC     Status: Abnormal   Collection Time: 05/11/18  7:43 AM  Result Value Ref Range   WBC 16.4 (H) 4.0 - 10.5 K/uL   RBC 2.97 (L) 3.87 - 5.11 MIL/uL   Hemoglobin 8.3 (L) 12.0 - 15.0 g/dL   HCT 27.5 (L) 36.0 - 46.0 %   MCV 92.6 80.0 - 100.0 fL   MCH 27.9 26.0 - 34.0 pg   MCHC 30.2 30.0 - 36.0 g/dL   RDW 12.4 11.5 - 15.5 %   Platelets 278 150 - 400 K/uL   nRBC 0.2 0.0 - 0.2 %   Dg Chest Port 1 View  Result Date:  05/10/2018 CLINICAL DATA:  Check tracheostomy placement EXAM: PORTABLE CHEST 1 VIEW COMPARISON:  05/07/2018 FINDINGS: New tracheostomy tube is noted in satisfactory position. Cardiac shadow remains enlarged. Nasogastric catheter has been removed in the interval. Persistent bibasilar lung opacities are noted stable from the prior exam. No new focal abnormality is seen. IMPRESSION: Stable bibasilar opacities. Tracheostomy tube in satisfactory position. Electronically Signed   By: Inez Catalina M.D.   On: 05/10/2018 08:49    Assessment/Plan: Diagnosis: Debility Labs independently reviewed.  Records reviewed and summated above.  1. Does the need for close, 24 hr/day medical supervision in concert with the patient's rehab needs make it unreasonable for this patient to be served in a less intensive setting? Yes  2. Co-Morbidities requiring supervision/potential complications: chronic kidney disease stage IV (avoid nephrotoxic meds), morbid obesity (encourage weight loss), insulin-dependent DM (Monitor in accordance with exercise and adjust meds as necessary), sarcoidosis with pulmonary and hepatic involvement, chronic respiratory failure with chronic tracheostomy, leukocytosis (repeat labs, cont to monitor for signs and symptoms of infection, further workup if indicated), Tachycardia (monitor in accordance with pain and increasing activity) 3. Due to bladder management, bowel management, safety, skin/wound care, disease management, pain management and patient education, does the patient require 24 hr/day rehab nursing? Yes 4. Does the patient require coordinated care of a physician, rehab nurse, PT (1-2 hrs/day, 5 days/week) and OT (1-2 hrs/day, 5 days/week) to address physical and functional deficits in the context of the above medical diagnosis(es)? Yes Addressing deficits in the following areas: balance, endurance, locomotion, strength, transferring, bathing, dressing, toileting and psychosocial  support 5. Can the patient actively participate in an intensive therapy program of at least 3 hrs of therapy per day at least 5 days per week? Potentially 6. The potential for patient to make measurable  gains while on inpatient rehab is excellent 7. Anticipated functional outcomes upon discharge from inpatient rehab are supervision and min assist  with PT, supervision and min assist with OT, n/a with SLP. 8. Estimated rehab length of stay to reach the above functional goals is: 14-18 days. 9. Anticipated D/C setting: Home 10. Anticipated post D/C treatments: HH therapy and Home excercise program 11. Overall Rehab/Functional Prognosis: good  RECOMMENDATIONS: This patient's condition is appropriate for continued rehabilitative care in the following setting: CIR if caregiver support available upon discharge when medically stable. Patient has agreed to participate in recommended program. Potentially Note that insurance prior authorization may be required for reimbursement for recommended care.  Comment: Rehab Admissions Coordinator to follow up.   I have personally performed a face to face diagnostic evaluation, including, but not limited to relevant history and physical exam findings, of this patient and developed relevant assessment and plan.  Additionally, I have reviewed and concur with the physician assistant's documentation above.   Delice Lesch, MD, ABPMR Lavon Paganini Angiulli, PA-C 05/11/2018

## 2018-05-10 NOTE — Progress Notes (Signed)
Internal Medicine Teaching Service to take over patient care 05/11/18 at 7am

## 2018-05-10 NOTE — Progress Notes (Signed)
Physical Therapy Treatment Patient Details Name: Melody Gray MRN: [id number] DOB: 1970/07/19 Today's Date: 05/10/2018    History of Present Illness 48 y.o. female admitted on 05/01/18 for cough, SOB, and loss of appetite.  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  Her course was complicated by PEA arrest/code on 05/03/18 had to be ventilated (difficult airway despite already having a trach in place (it was cuffless) also found to have pulseless L lower extremity.  Pt dx with compartment syndrome and underwent faciotomy on 05/03/18 (left open with wound vac), faciotomies closed on 05/06/18.  She continues to be on/off the ventilator as of 05/08/18.  Pt changed to a #4 cuffless trach on 05/10/18 and is now on her home level of O2 (2 L O2 [place]).      PT Comments    Pt was able to stand several times EOB and ultimately ended up being safer to transfer OOB to chair with steady standing frame than with pivotal steps around with the RW.  She remains weak and is limited by left lower leg pain and right foot (gout) pain.  She remains appropriate for CIR and I will order OT as well.  She was changed to a #4 cuffless trach today and is back on her 2 L O2 [place] that she used at home.  PT will continue to follow acutely for safe mobility progression.   Follow Up Recommendations  CIR     Equipment Recommendations  Rolling walker with 5" wheels    Recommendations for Other Services   NA     Precautions / Restrictions Precautions Precautions: Fall;Other (comment)(monitor O2/vitals)    Mobility  Bed Mobility Overal bed mobility: Needs Assistance Bed Mobility: Supine to Sit     Supine to sit: Min assist;HOB elevated     General bed mobility comments: Min hand held assist to come to EOB.  HOB ~30 degrees, pt using railing as well  Transfers Overall transfer level: Needs assistance Equipment used: Rolling walker (2 wheeled) Transfers: Sit to/from [employer] Sit to Stand: Mod assist;+2 physical assistance Stand pivot transfers: (used steady standing frame)       General transfer comment: Pt was able to stand EOB with RW and two person mod assist, wide BOS, painful bil legs/feet and pt hyperextended at both knees relying heavily on RW and support of lower legs on bed.  Unable to take pivotal steps safely at this time.  Swiched out RW for steady standing frame and pt was able to stand again and get to the chair via standing frame.   Ambulation/Gait             General Gait Details: unable at this time.               Cognition Arousal/Alertness: Awake/alert Behavior During Therapy: WFL for tasks assessed/performed Overall Cognitive Status: Within Functional Limits for tasks assessed                                           General Comments General comments (skin integrity, edema, etc.): Pt reported lighteadedness EOB, BPs were mildly elevated, but not low, HR 120s, O2 89 on 2 L O2 [place], so bumped up to 3 L for mobility and turned back to 2 once in the chair.       Pertinent Vitals/Pain Pain Assessment:  Faces Faces Pain Scale: Hurts whole lot Pain Location: left lower leg and right foot (gout) Pain Descriptors / Indicators: Grimacing;Guarding Pain Intervention(s): Limited activity within patient's tolerance;Monitored during session;Repositioned           PT Goals (current goals can now be found in the care plan section) Acute Rehab PT Goals Patient Stated Goal: to decrease bil leg pain so she can walk again.  Progress towards PT goals: Progressing toward goals    Frequency    Min 3X/week      PT Plan Current plan remains appropriate       AM-PAC PT "6 Clicks" Mobility   Outcome Measure  Help needed turning from your back to your side while in a flat bed without using bedrails?: A Little Help needed moving from lying on your back to sitting on the side of a flat bed without using  bedrails?: A Little Help needed moving to and from a bed to a chair (including a wheelchair)?: A Lot Help needed standing up from a chair using your arms (e.g., wheelchair or bedside chair)?: A Lot Help needed to walk in hospital room?: Total Help needed climbing 3-5 steps with a railing? : Total 6 Click Score: 12    End of Session Equipment Utilized During Treatment: Gait belt;Oxygen(2 L O2 [place]) Activity Tolerance: Patient limited by fatigue;Patient limited by pain Patient left: in chair;with call bell/phone within reach;with chair alarm set;with family/visitor present Nurse Communication: Mobility status;Need for lift equipment(told RN, Lauren, steady standing frame was much easier ) PT Visit Diagnosis: Muscle weakness (generalized) (M62.81);Difficulty in walking, not elsewhere classified (R26.2);Pain Pain - Right/Left: Left(both) Pain - part of body: Leg;Ankle and joints of foot(L lower leg, right foot)     Time: 7290-2111 PT Time Calculation (min) (ACUTE ONLY): 32 min  Charges:  $Therapeutic Activity: 23-37 mins                    Alayja Armas B. Wendy Hoback, PT, DPT  Acute Rehabilitation [phone] pager #[phone] office   05/10/2018, 2:54 PM

## 2018-05-10 NOTE — Progress Notes (Signed)
NAME:  Melody Gray, MRN:  [id number], DOB:  12/04/1970, LOS: 9 ADMISSION DATE:  04/30/2018, CONSULTATION DATE:  05/03/18 REFERRING MD:  Dr. Valeta Harms, CHIEF COMPLAINT:  Cardiac arrest    Brief History   48 y/o F with sarcoidosis & chronic tracheostomy in the setting of OHS admitted 12/31 with AKI and hyperkalemia.  Developed PEA arrest after admit requiring CPR ~ 40 minutes.  She required an emergent trach change during the arrest for ventilation which was unsuccessful.  ETT intubation was unsuccessful as well.  ENT placed an ETT via her tracheostomy stoma.  She was then found to have a pulseless left extremity and was placed on anticoagulation.  Tx to [facility] for VVS evaluation.    Past Medical History  Sarcoidosis - lung, liver involvement; OSA/OHS s/p trach  Pulmonary HTN - pa peak 83 on ECHO 07/2014; HTN; DM  CKD II; NICM - LVEF 55-60% in 07/2014   Significant Hospital Events   12/31  Admit with AKI, hyperkalemia  01/03  PEA arrest, 40 min before ROSC.  DIFFICULT AIRWAY.  01/04  ENT created new trach tube out of #7 ETT  1/6 back to OR for closure of LLE. Hemodynamics improved.  1/7: Hemodynamically stable remains off pressors.  Echocardiogram pending for evaluation of bacteremia blood cultures pending.  Fully awake.  Starting diuresis, working on weaning. 1/9: Echocardiogram without evidence of vegetation.  Continues to improve.  Renal function improved.  Work of breathing improved.  Tolerated aerosol trach collar, transition back to her regular tracheostomy which was a size 4 cuffless initiated Passy-Muir valve 1/10: No issues overnight.  Anxious to eat.  Still constipated added daily MiraLAX.  Starting tube passed flatulence.  Advancing diet.  Mobilizing.  Developed some right foot discomfort consistent with gout started prednisone taper.  Ready to transfer out of the intensive care.  Consults:  VVS  Procedures:  L I-O 1/3 >> 1/3  ALine 1/3 >>  TLC 1/3 >>  ETT via Stoma 1/3 >>  1/9: Replaced her tracheostomy with size 4 cuffless (this is her baseline trach)  Significant Diagnostic Tests:  CT ABD/Pelvis 1/3 >> large LLL PNA, probable RML as well, hepatic cirrhosis, stable 3.1 cm low density in midpole of left kidney ECHO 1/7>>> Micro Data:  BCx2 1/3 >> 1/2 with GPC's >> MRSA  Tracheal aspirate 1/4 >> normal flora UC 1/3 >>   Antimicrobials:  Levaquin 1/3 >> 1/3  Cefepime 1/3 >> 1/9 linezolid 1/3>>>  Interim history/subjective:  Her primary complaint is pain involving the right foot and some discomfort with deep breaths Objective   Blood pressure (Abnormal) 158/85, pulse 90, temperature (Abnormal) 100.5 F (38.1 C), temperature source Oral, resp. rate (Abnormal) 22, height 5\' 1"  (1.549 m), weight 101.7 kg, SpO2 97 %.    Vent Mode: Stand-by FiO2 (%):  [40 %-60 %] 60 %   Intake/Output Summary (Last 24 hours) at 05/10/2018 0941 Last data filed at 05/10/2018 0700 Gross per 24 hour  Intake 1399.49 ml  Output 3560 ml  Net -2160.51 ml   Filed Weights   05/08/18 0500 05/09/18 0500 05/10/18 0500  Weight: 107.7 kg 105.9 kg 101.7 kg    Examination:  General: This is a pleasant 48 year old female who is resting in bed and in no acute distress HEENT normocephalic atraumatic facial swelling is improved.  She now has her regular size 4 cuffless [place] in place and is phonating strongly with Passy-Muir valve Pulmonary: Some scattered rhonchi but otherwise no accessory use Cardiac: Regular  rate and rhythm Abdomen: Soft, not tender.  Starting to pass gas. Extremities: Left lower extremity is wrapped CMS is intact she does have pain to palpation of the right foot specifically.  This is intermittent from time to time Neuro: Awake oriented no focal deficits.  Resolved Hospital Problem list     PEA arrest 1/3 Cardiogenic shock Aspiration pneumonia versus hcap, treated with 7 days of cefepime  Assessment & Plan:   Acute on chronic hypoxic respiratory failure in  the setting of volume overload, and acute on chronic renal failure.  Superimposed on underlying advanced sarcoidosis, and sleep apnea -She has been tracheostomy dependent for several years secondary to difficult airway and prior traumatic intubation Portable chest x-ray personally reviewed on 1/10: Persistent right lower lobe atelectasis/airspace disease however this is stable when comparing prior films  -Clinically she continues to improve we downsized her yesterday to her traditional tracheostomy she feels well and is improving  plan Wean oxygen Routine tracheostomy care Continuing diuresis  Continue mobility Treat chest discomfort following CPR  Chronic trach dependence  Plan Continue routine trach care She will follow back in trach clinic after discharge  MRSA bacteremia (1 of 3 bottles) -No evidence of valvular vegetation on echo Plan Plan is to continue 7 more days of oral Zyvox  for transient MRSA bacteremia as directed by infectious disease  Left Leg Compartment Syndrome s/p IO device requiring surgical intervention, status post closure in OR 1/6 P: Postop care per vascular surgery  Pulmonary HTN -in setting of sarcoidosis  P: Continuing supportive care avoiding hypoxia Continue diuresis   Acute on chronic renal failure w/ worsening NAG metabolic acidosis -Renal function continuing to improve P Continuing twice daily diuresis as long as BP, BUN and creatinine allow Follow-up a.m. chemistry  Constipation: no BP for 6 days, starting to move bowels now P: Daily Mirilax Adv diet   Hepatic Cirrhosis prob 2/2 chronic PH P: Avoid hypoxia and right heart strain  Gout involving right foot P: 5 days prednisone this should also help with her costochondritis  Best practice:  Diet: NPO Pain/Anxiety/Delirium protocol (if indicated): Fentanyl for pain VAP protocol (if indicated): in place  DVT prophylaxis: SCD; change to [place] heparin  GI prophylaxis: pepcid  Glucose  control: SSI Mobility: bed rest  Code Status: Full code  Family Communication: Sister Justice Rocher, works in food services at [employer]) updated at bedside 1/4.family updated at bedside  Disposition: Looks fantastic.  Downsize tracheostomy yesterday.  Ready to transition to medical ward.  We will have triad reassume care.    [name] ACNP-BC [place] Pager # [phone] OR # [phone] if no answer

## 2018-05-11 DIAGNOSIS — J9600 Acute respiratory failure, unspecified whether with hypoxia or hypercapnia: Secondary | ICD-10-CM | POA: Insufficient documentation

## 2018-05-11 DIAGNOSIS — G4733 Obstructive sleep apnea (adult) (pediatric): Secondary | ICD-10-CM

## 2018-05-11 DIAGNOSIS — M109 Gout, unspecified: Secondary | ICD-10-CM

## 2018-05-11 DIAGNOSIS — N281 Cyst of kidney, acquired: Secondary | ICD-10-CM

## 2018-05-11 DIAGNOSIS — R000 Tachycardia, unspecified: Secondary | ICD-10-CM

## 2018-05-11 DIAGNOSIS — D869 Sarcoidosis, unspecified: Secondary | ICD-10-CM

## 2018-05-11 DIAGNOSIS — Z79899 Other long term (current) drug therapy: Secondary | ICD-10-CM

## 2018-05-11 DIAGNOSIS — I5033 Acute on chronic diastolic (congestive) heart failure: Secondary | ICD-10-CM

## 2018-05-11 DIAGNOSIS — J9690 Respiratory failure, unspecified, unspecified whether with hypoxia or hypercapnia: Secondary | ICD-10-CM | POA: Insufficient documentation

## 2018-05-11 DIAGNOSIS — M940 Chondrocostal junction syndrome [Tietze]: Secondary | ICD-10-CM

## 2018-05-11 DIAGNOSIS — Z8701 Personal history of pneumonia (recurrent): Secondary | ICD-10-CM

## 2018-05-11 DIAGNOSIS — K5900 Constipation, unspecified: Secondary | ICD-10-CM

## 2018-05-11 DIAGNOSIS — D72829 Elevated white blood cell count, unspecified: Secondary | ICD-10-CM

## 2018-05-11 DIAGNOSIS — D8689 Sarcoidosis of other sites: Secondary | ICD-10-CM

## 2018-05-11 DIAGNOSIS — E1122 Type 2 diabetes mellitus with diabetic chronic kidney disease: Secondary | ICD-10-CM

## 2018-05-11 LAB — CBC
HCT: 27.5 % — ABNORMAL LOW (ref 36.0–46.0)
Hemoglobin: 8.3 g/dL — ABNORMAL LOW (ref 12.0–15.0)
MCH: 27.9 pg (ref 26.0–34.0)
MCHC: 30.2 g/dL (ref 30.0–36.0)
MCV: 92.6 fL (ref 80.0–100.0)
Platelets: 278 10*3/uL (ref 150–400)
RBC: 2.97 MIL/uL — ABNORMAL LOW (ref 3.87–5.11)
RDW: 12.4 % (ref 11.5–15.5)
WBC: 16.4 10*3/uL — ABNORMAL HIGH (ref 4.0–10.5)
nRBC: 0.2 % (ref 0.0–0.2)

## 2018-05-11 LAB — BASIC METABOLIC PANEL
Anion gap: 13 (ref 5–15)
BUN: 54 mg/dL — ABNORMAL HIGH (ref 6–20)
CO2: 23 mmol/L (ref 22–32)
Calcium: 8.6 mg/dL — ABNORMAL LOW (ref 8.9–10.3)
Chloride: 101 mmol/L (ref 98–111)
Creatinine, Ser: 3.43 mg/dL — ABNORMAL HIGH (ref 0.44–1.00)
GFR calc Af Amer: 17 mL/min — ABNORMAL LOW (ref >60)
GFR calc non Af Amer: 15 mL/min — ABNORMAL LOW (ref >60)
Glucose, Bld: 187 mg/dL — ABNORMAL HIGH (ref 70–99)
Potassium: 4.9 mmol/L (ref 3.5–5.1)
Sodium: 137 mmol/L (ref 135–145)

## 2018-05-11 LAB — GLUCOSE, CAPILLARY
Glucose-Capillary: 180 mg/dL — ABNORMAL HIGH (ref 70–99)
Glucose-Capillary: 338 mg/dL — ABNORMAL HIGH (ref 70–99)
Glucose-Capillary: 311 mg/dL — ABNORMAL HIGH (ref 70–99)
Glucose-Capillary: 227 mg/dL — ABNORMAL HIGH (ref 70–99)

## 2018-05-11 LAB — CULTURE, BLOOD (ROUTINE X 2)
Culture: NO GROWTH
Culture: NO GROWTH

## 2018-05-11 MED ORDER — LINEZOLID 600 MG PO TABS
600.0000 mg | ORAL_TABLET | Freq: Two times a day (BID) | ORAL | Status: DC
  Administered 2018-05-11 – 2018-05-12 (×3): 600 mg via ORAL
  Filled 2018-05-11 (×5): qty 1

## 2018-05-11 MED ORDER — SENNOSIDES-DOCUSATE SODIUM 8.6-50 MG PO TABS
2.0000 | ORAL_TABLET | Freq: Two times a day (BID) | ORAL | Status: DC
  Administered 2018-05-11 (×2): 2 via ORAL
  Filled 2018-05-11 (×2): qty 2

## 2018-05-11 MED ORDER — INSULIN DETEMIR 100 UNIT/ML ~~LOC~~ SOLN
30.0000 [IU] | Freq: Every day | SUBCUTANEOUS | Status: DC
  Administered 2018-05-11 – 2018-05-17 (×7): 30 [IU] via SUBCUTANEOUS
  Filled 2018-05-11 (×9): qty 0.3

## 2018-05-11 MED ORDER — ONDANSETRON HCL 4 MG/2ML IJ SOLN
4.0000 mg | Freq: Four times a day (QID) | INTRAMUSCULAR | Status: DC | PRN
  Administered 2018-05-11 (×2): 4 mg via INTRAVENOUS
  Filled 2018-05-11 (×3): qty 2

## 2018-05-11 MED ORDER — OXYCODONE HCL 5 MG PO TABS
10.0000 mg | ORAL_TABLET | ORAL | Status: DC | PRN
  Administered 2018-05-11 – 2018-05-17 (×12): 10 mg via ORAL
  Filled 2018-05-11 (×13): qty 2

## 2018-05-11 NOTE — Progress Notes (Addendum)
Transfer Summary: Melody Gray is a 48 yo female with history of sarcoidosis with pulmonary and hepatic involvement, chronic hypoxic respiratory failure 2/2 sarcoidosis and severe OSA trach-dependent, HFpEF with RV failure, CKD stage IV, and IDDM who presented to [facility] on 12/31 with dyspnea and cough and was found to be in acute HF exacerbation as well as have an AKI with hyperkalemia. She developed a multifocal pneumonia during her hospital course and was started on Levaquin.  On 1/3 she had a PEA arrest secondary to hyperkalemia and had a prolonged code (40-45 mins). She was started on low dose pressors and required a #7 ETT through ostomy due to her difficult airway.  She did not undergo cooling protocol as she was neurologically intact after our ROSC.  Her hospital course has been complicated by left lower extremity compartment syndrome s/p fasciotomy, acute renal failure, MRSA bacteremia, and gout flare. He was weaned off pressors on 1/5 and weaned off ventilator and switched to her regular size 4 cuffless trach on 1/9.   Subjective: Melody Gray is feeling well this morning. She was sitting up in the chair talking to her son. Her only complaint is chest wall pain and LLE pain. She is requesting an increased in her pain medication. She reports feeling happy that her son is visiting her. She is planning to play [employer] with him today.   Objective:  Vital signs in last 24 hours: Vitals:   05/11/18 0734 05/11/18 0800 05/11/18 0816 05/11/18 0916  BP: 138/84  121/73   Pulse: (!) 115  (!) 116 95  Resp:    16  Temp:  99.1 F (37.3 C)    TempSrc:  Oral    SpO2: 94%  97% 96%  Weight:      Height:       General: Young female who appears older than stated age, sitting up in chair eating breakfast, in no acute distress CV: Regular rate and rhythm, normal S1-S2, no mrg  Pulm: Coarse breath sounds on the right lung, otherwise clear to auscultation without wheezes or crackles, no increased work of breathing on 2  L Ext: Warm and well-perfused bilaterally, medial and lateral incisions on LLE are healing well and appears dry without signs of infection Neuro: Alert and oriented x3, full strength and sensation in all 4 extremities  Assessment/Plan:  Principal Problem:   MRSA bacteremia Active Problems:   Lesion of liver and cirrhosis on imaging   Insulin-requiring or dependent type II diabetes mellitus ([place])   Hypertension complicating diabetes ([place])   Acute on chronic respiratory failure with hypoxemia (HCC)   Acute respiratory failure with hypoxia (HCC)   Acute kidney injury (AKI) with acute tubular necrosis (ATN) (HCC)   Acute renal failure superimposed on stage 4 chronic kidney disease (HCC)   Chronic respiratory failure with hypoxia (HCC)/ trach dep   Hyperkalemia   Chronic pulmonary hypertension (HCC)   Cardiac arrest (HCC)   Nontraumatic compartment syndrome of left lower extremity   Pneumonia   Abdominal tightness  # Acute on chronic hypoxic respiratory failure 2/2 acute on chronic HFpEF exacerbation: Extubated on 1/9.  Respiratory status is stable.  She is now on her home 2 L of oxygen by nasal cannula as well as wearing her regular size cuffless trach.  She has been on IV Lasix 80 mg BID until yesterday.  She appears euvolemic on exam, and she is not on Lasix at home will therefore hold off on diuresis today.  # MRSA bacteremia:  1/3 bottles positive for MRSA bacteremia. TTE without evidence of vegetation.  TEE not done. On IV Zyvox, will switch to p.o. per ID recs.  - Switch to PO Zyxox, end date 1/16  # AoCKD Stage IV with hyperkalemia: Baseline Cr 2.4-2.7, currently stable at 3.3-3.4. Patient has not required HD or CRRT during this admission. I suspect her acutely worsened renal function is from either progression of CKD as she was being worked up for AKI as an outpatient vs shock/ATN from prolonged code. Nephrology consulted during this admission, signed off 1/7 after renal function  improved x 2 days. Will likely need outpatient nephrology referral when discharged given advanced CKD.   # LLE compartment syndrome: s/p fasciotomy 1/3 with closure on 1/6. VVS signed off. PT/OT recommending CIR.   # Multifocal PNA: S/p cefepime x 7 days. Completed tx on 1/9.  Will need repeat CXR in 6 weeks.   # R foot gout flare: Will continue prednisone x 5 days. Day 2.   # Constipation: Likely opiod-induced. Bowel regimen as below. Will continue Miralax QD and add Senokot 2 tabs BID   # Chest wall pain, costochondritis: 2/2 CPR. Will increase oxy to 10 mg q4h PRN. Bowel regimen as above.   # IDDM: On Levemir 50 units at home, glipizide 2.5 mg BID, and Victoza 1.8 mg QD. CBG have been elevated during this admission and I would expect them to continue to increase as she was started on prednisone yesterday for R foot gout flare. She is also tolerating PO intake very well.  - Increase Levemir to 30 units + continue SSI-R - Change regular--> renal/CM diet   # Sarcoidosis with pulmonary (pHTN) and hepatic (cirrhosis) involvement: Respiratory status back to baseline. Will continue to monitor LFTs.    # Difficult airway: Patient trach dependent due to severe sleep apnea and difficult airway from traumatic intubation in the past. Required intubation by ENT. Currently on her regular size 4 cuffles trach as well as on 2L O2 by [place] which is what she uses regularly at home. PCCM following for trach.   # Renal mass: Had renal US on 01/2018 that showed a complex renal cyst on the L kidney.  MRI recommended for further evaluation.  She had a CT abdomen pelvis on 1/3 that showed a stable 3.1 cm cyst in the midpole of left kidney.  Radiology continues to recommend MRI or ultrasound in 6 months if patient unable to undergo MRI.  # Tobacco use disorder: 10 pack-year history. Will continue to encourage cessation.   Dispo: Anticipated discharge in approximately 1-2 day(s).   Welford Roche,  MD 05/11/2018, 9:48 AM Pager: [phone]

## 2018-05-12 ENCOUNTER — Encounter (HOSPITAL_COMMUNITY): Payer: Self-pay | Admitting: Internal Medicine

## 2018-05-12 ENCOUNTER — Inpatient Hospital Stay (HOSPITAL_COMMUNITY): Payer: Medicare Other

## 2018-05-12 DIAGNOSIS — F17200 Nicotine dependence, unspecified, uncomplicated: Secondary | ICD-10-CM | POA: Diagnosis present

## 2018-05-12 DIAGNOSIS — Z9889 Other specified postprocedural states: Secondary | ICD-10-CM

## 2018-05-12 DIAGNOSIS — K922 Gastrointestinal hemorrhage, unspecified: Secondary | ICD-10-CM

## 2018-05-12 DIAGNOSIS — Z8719 Personal history of other diseases of the digestive system: Secondary | ICD-10-CM | POA: Diagnosis not present

## 2018-05-12 DIAGNOSIS — D62 Acute posthemorrhagic anemia: Secondary | ICD-10-CM | POA: Diagnosis present

## 2018-05-12 LAB — CBC
HCT: 16.2 % — ABNORMAL LOW (ref 36.0–46.0)
HCT: 25.5 % — ABNORMAL LOW (ref 36.0–46.0)
Hemoglobin: 5 g/dL — CL (ref 12.0–15.0)
Hemoglobin: 8.1 g/dL — ABNORMAL LOW (ref 12.0–15.0)
MCH: 29.1 pg (ref 26.0–34.0)
MCH: 28.6 pg (ref 26.0–34.0)
MCHC: 30.9 g/dL (ref 30.0–36.0)
MCHC: 31.8 g/dL (ref 30.0–36.0)
MCV: 94.2 fL (ref 80.0–100.0)
MCV: 90.1 fL (ref 80.0–100.0)
Platelets: 280 10*3/uL (ref 150–400)
Platelets: 226 10*3/uL (ref 150–400)
RBC: 1.72 MIL/uL — ABNORMAL LOW (ref 3.87–5.11)
RBC: 2.83 MIL/uL — ABNORMAL LOW (ref 3.87–5.11)
RDW: 12.7 % (ref 11.5–15.5)
RDW: 15.6 % — ABNORMAL HIGH (ref 11.5–15.5)
WBC: 16.7 10*3/uL — ABNORMAL HIGH (ref 4.0–10.5)
WBC: 16.6 10*3/uL — ABNORMAL HIGH (ref 4.0–10.5)
nRBC: 0.3 % — ABNORMAL HIGH (ref 0.0–0.2)
nRBC: 0.4 % — ABNORMAL HIGH (ref 0.0–0.2)

## 2018-05-12 LAB — COMPREHENSIVE METABOLIC PANEL
ALT: 21 U/L (ref 0–44)
AST: 26 U/L (ref 15–41)
Albumin: 2.1 g/dL — ABNORMAL LOW (ref 3.5–5.0)
Alkaline Phosphatase: 140 U/L — ABNORMAL HIGH (ref 38–126)
Anion gap: 13 (ref 5–15)
BUN: 126 mg/dL — ABNORMAL HIGH (ref 6–20)
CO2: 23 mmol/L (ref 22–32)
Calcium: 8 mg/dL — ABNORMAL LOW (ref 8.9–10.3)
Chloride: 97 mmol/L — ABNORMAL LOW (ref 98–111)
Creatinine, Ser: 4.68 mg/dL — ABNORMAL HIGH (ref 0.44–1.00)
GFR calc Af Amer: 12 mL/min — ABNORMAL LOW (ref >60)
GFR calc non Af Amer: 10 mL/min — ABNORMAL LOW (ref >60)
Glucose, Bld: 258 mg/dL — ABNORMAL HIGH (ref 70–99)
Potassium: 5.2 mmol/L — ABNORMAL HIGH (ref 3.5–5.1)
Sodium: 133 mmol/L — ABNORMAL LOW (ref 135–145)
Total Bilirubin: 0.3 mg/dL (ref 0.3–1.2)
Total Protein: 6.5 g/dL (ref 6.5–8.1)

## 2018-05-12 LAB — PROTIME-INR
INR: 1.05
Prothrombin Time: 13.7 seconds (ref 11.4–15.2)

## 2018-05-12 LAB — PREPARE RBC (CROSSMATCH)

## 2018-05-12 LAB — GLUCOSE, CAPILLARY
Glucose-Capillary: 261 mg/dL — ABNORMAL HIGH (ref 70–99)
Glucose-Capillary: 281 mg/dL — ABNORMAL HIGH (ref 70–99)
Glucose-Capillary: 223 mg/dL — ABNORMAL HIGH (ref 70–99)
Glucose-Capillary: 184 mg/dL — ABNORMAL HIGH (ref 70–99)
Glucose-Capillary: 168 mg/dL — ABNORMAL HIGH (ref 70–99)

## 2018-05-12 LAB — SURGICAL PCR SCREEN
MRSA, PCR: NEGATIVE
Staphylococcus aureus: NEGATIVE

## 2018-05-12 LAB — PHOSPHORUS: Phosphorus: 4.9 mg/dL — ABNORMAL HIGH (ref 2.5–4.6)

## 2018-05-12 LAB — MAGNESIUM: Magnesium: 1.8 mg/dL (ref 1.7–2.4)

## 2018-05-12 MED ORDER — PANTOPRAZOLE SODIUM 40 MG PO TBEC: 40.0000 mg | DELAYED_RELEASE_TABLET | Freq: Two times a day (BID) | ORAL | Status: DC

## 2018-05-12 MED ORDER — SODIUM CHLORIDE 0.9% IV SOLUTION
Freq: Once | INTRAVENOUS | Status: AC
  Administered 2018-05-12: 13:00:00 via INTRAVENOUS

## 2018-05-12 MED ORDER — INSULIN ASPART 100 UNIT/ML ~~LOC~~ SOLN
4.0000 [IU] | Freq: Three times a day (TID) | SUBCUTANEOUS | Status: DC
  Administered 2018-05-12 – 2018-05-18 (×16): 4 [IU] via SUBCUTANEOUS

## 2018-05-12 MED ORDER — SODIUM CHLORIDE 0.9 % IV BOLUS
1000.0000 mL | Freq: Once | INTRAVENOUS | Status: AC
  Administered 2018-05-12: 1000 mL via INTRAVENOUS

## 2018-05-12 MED ORDER — PANTOPRAZOLE SODIUM 40 MG IV SOLR
40.0000 mg | Freq: Two times a day (BID) | INTRAVENOUS | Status: DC
  Administered 2018-05-12 – 2018-05-14 (×4): 40 mg via INTRAVENOUS
  Filled 2018-05-12 (×5): qty 40

## 2018-05-12 NOTE — Consult Note (Signed)
PULMONARY / CRITICAL CARE MEDICINE   NAME:  Melody Gray, MRN:  [id number], DOB:  August 09, 1970, LOS: 25 ADMISSION DATE:  04/30/2018, CONSULTATION DATE:  05/12/2018 REFERRING MD:  Vivien Rossetti, CHIEF COMPLAINT:  GI Bleed nd need for venous access  BRIEF HISTORY:    [name] is a 48 yo F with history of sarcoidosis with pulmonary and hepatic involvement, chronic hypoxic respiratory failure 2/2 sarcoidosis and severe OSA, difficult airway from prior traumatic intubation, CKD IV, tobacco use disorder who presented with AoC HF exacerbation and had a prolonged PEA arrest 2/2 hyperkalemia. Hospital course was complicated by multifocal PNA for which she has completed tx and MRSA bacteremia.   Today, the patient developed a hemoglobin 5 this a.m.  8.3 yesterday. She is at risk for variceal bleeding given cirrhosis, stress gastric ulcer gastritis bleeding and other ischemic bleeding given her recent pulseless electrical activity arrest. As she has limited IV access, PCCM consulted for placement of a central line.  SUBJECTIVE:  No major c/o, e.g. SOB  CONSTITUTIONAL: BP (!) 149/90   Pulse 86   Temp 97.9 F (36.6 C) (Oral)   Resp 18   Ht 5\' 1"  (1.549 m)   Wt 100.7 kg   SpO2 100%   BMI 41.95 kg/m   I/O last 3 completed shifts: In: 2135.3 [P.O.:240; I.V.:229.7; Blood:701.5; NG/GT:400; IV Piggyback:564.1] Out: 1600 [Urine:1600]     FiO2 (%):  [28 %-35 %] 35 %  PHYSICAL EXAM: General:  WD/WN B F NAD Neuro:  Non focal, A&O HEENT:  [place]/AT, PRRL, EOMI;OP neg Cardiovascular:  RRR, nom/r/g Lungs:  Clear bilat Abdomen:  Mild abd enderness Musculoskeletal:  No active joints Skin:  No c/c/e  RESOLVED PROBLEM LIST   ASSESSMENT AND PLAN   Place TLC   LABS  Glucose Recent Labs  Lab 05/11/18 1144 05/11/18 1640 05/11/18 2145 05/12/18 0731 05/12/18 1156 05/12/18 1640  GLUCAP 338* 311* 227* 261* 281* 223*    BMET Recent Labs  Lab 05/10/18 0649 05/11/18 0620 05/12/18 0601   NA 138 137 133*  K 4.7 4.9 5.2*  CL 104 101 97*  CO2 21* 23 23  BUN 46* 54* 126*  CREATININE 3.34* 3.43* 4.68*  GLUCOSE 96 187* 258*    Liver Enzymes Recent Labs  Lab 05/08/18 0446 05/10/18 0649 05/12/18 0601  AST 15 19 26   ALT 18 17 21   ALKPHOS 112 135* 140*  BILITOT 0.5 0.7 0.3  ALBUMIN 1.7* 2.0* 2.1*    Electrolytes Recent Labs  Lab 05/07/18 1844 05/08/18 0446 05/10/18 0649 05/11/18 0620 05/12/18 0601  CALCIUM  --  8.7* 8.5* 8.6* 8.0*  MG 2.4 2.1  --   --  1.8  PHOS 5.2* 4.4  --   --  4.9*    CBC Recent Labs  Lab 05/09/18 0457 05/11/18 0743 05/12/18 0601  WBC 8.7 16.4* 16.7*  HGB 8.6* 8.3* 5.0*  HCT 28.1* 27.5* 16.2*  PLT 221 278 280    ABG No results for input(s): PHART, PCO2ART, PO2ART in the last 168 hours.  Coag's No results for input(s): APTT, INR in the last 168 hours.  Sepsis Markers No results for input(s): LATICACIDVEN, PROCALCITON, O2SATVEN in the last 168 hours.  Cardiac Enzymes No results for input(s): TROPONINI, PROBNP in the last 168 hours.  PAST MEDICAL HISTORY :   She  has a past medical history of Abnormal LFTs (liver function tests), Anemia, Cardiomyopathy, nonischemic (HCC), Chronic diastolic CHF (congestive heart failure) ([place]), Chronic respiratory failure ([place]), CKD (chronic  kidney disease), stage II, Complication of anesthesia, Diabetes mellitus, Diabetic retinopathy, Essential hypertension, Health maintenance examination, Helicobacter pylori ab+ (05/2011), cardiac cath (2/08), Hypoxemia, Long term current use of systemic steroids, Morbid obesity ([place]), Obesity hypoventilation syndrome ([place]), Pulmonary hypertension ([place]), QT prolongation, Sarcoidosis, Seborrheic dermatitis of scalp, Sleep apnea, and Tobacco abuse.  PAST SURGICAL HISTORY:  She  has a past surgical history that includes Tubal ligation; Breast surgery; Cesarean section; Tracheostomy tube placement (N/A, 08/10/2013); right heart catheterization (N/A, 08/12/2014);  Fasciotomy (Left, 05/03/2018); and Fasciotomy closure (Left, 05/06/2018).  Allergies  Allergen Reactions  . Vicodin [Hydrocodone-Acetaminophen] Itching    No current facility-administered medications on file prior to encounter.    Current Outpatient Medications on File Prior to Encounter  Medication Sig  . amLODipine (NORVASC) 10 MG tablet Take 1 tablet (10 mg total) by mouth daily.  . carvedilol (COREG) 6.25 MG tablet Take 1 tablet (6.25 mg total) by mouth 2 (two) times daily.  Marland Kitchen glipiZIDE (GLUCOTROL) 5 MG tablet Take 0.5 tablets (2.5 mg total) by mouth daily before breakfast.  . insulin detemir (LEVEMIR) 100 UNIT/ML injection ADMINISTER 50 UNITS UNDER THE SKIN AT BEDTIME  . liraglutide (VICTOZA) 18 MG/3ML SOPN Inject 0.3 mLs (1.8 mg total) into the skin daily.  . Pseudoephedrine-DM-GG (ROBITUSSIN COLD & COUGH PO) Take 15 mLs by mouth daily as needed (cough).  . ranitidine (ZANTAC) 150 MG tablet Take 1 tablet (150 mg total) by mouth 2 (two) times daily.  Marland Kitchen atorvastatin (LIPITOR) 40 MG tablet Take 1 tablet (40 mg total) by mouth daily. (Patient not taking: Reported on 04/30/2018)  . BD INSULIN SYRINGE U/F 30G X 1/2" 0.5 ML MISC USE AS DIRECTED  . glucose blood test strip Use to check blood sugar 3 times daily. Dx code E11.65. Insulin dependent  . Insulin Pen Needle (B-D UF III MINI PEN NEEDLES) 31G X 5 MM MISC USE TWICE A DAY    FAMILY HISTORY:   Her family history includes Asthma in her sister; Cancer in her mother; Diabetes in her father; Hypertension in an other family member; Multiple sclerosis in her father.  SOCIAL HISTORY:  She  reports that she has been smoking cigarettes. She started smoking about 3 years ago. She has a 6.60 pack-year smoking history. She has never used smokeless tobacco. She reports that she does not drink alcohol or use drugs.  REVIEW OF SYSTEMS:    Per HPI

## 2018-05-12 NOTE — Consult Note (Signed)
Consultation  Referring Provider:     Medical teaching service Primary Care Physician:  Neva Seat, MD Primary Gastroenterologist:    Previous patient of Dr. Deatra Ina Reason for Consultation:     GI bleed     Impression / Plan:   GI bleeding suspect upper bleed with melena.  Hemoglobin 5 this a.m.  8.3 yesterday. She is at risk for variceal bleeding given cirrhosis, stress gastric ulcer gastritis bleeding and other ischemic bleeding given her recent pulseless electrical activity arrest. 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  She is improved. Would be helpful if she could have better IV access.  If she gets shocky and needs more aggressive support then the IV she has will probably not be enough.  I have spoken to the covering resident and he will discuss possible central line with CCM.  Multiple attempts at IVs have been unsuccessful and a PICC line is not appropriate with her chronic kidney disease.  Given that she has the cirrhosis pattern on imaging a dose of IV ceftriaxone could be considered but she is on other antibiotics so will not do so.  Plan for an upper GI endoscopy tomorrow by Dr. Ardis Hughs.The risks and benefits as well as alternatives of endoscopic procedure(s) have been discussed and reviewed. All questions answered. The patient agrees to proceed.  Check INR with next hemoglobin  Gatha Mayer, MD, [facility] 05/12/2018 5:23 PM Pager [phone]          HPI:   Cassadee Vanzandt Wehner is a 48 y.o. female who had melena overnight and a hemoglobin from 8 23-5.  She had been admitted to the hospital April 30, 2018 because of new onset renal failure.  On January 3 she had a pulseless electrical activity arrest I believe associated with hyperkalemia, and had an  intraosseous line placed in the left leg, and had a long period of time with coding, and difficulty oxygenating and accessing her airway through her trach but was eventually accomplished.  She was transferred to the medical teaching service yesterday.  Overnight she had several melenic stools and her hemoglobin was 5 though her vital signs were relatively stable.  No abdominal pain and there is no history of GI bleeding in the past and she is never had an endoscopic evaluation.  She does have a history of hepatomegaly and liver biopsy in 2009 previously seen by my former partner Dr. Deatra Ina, and granulomatous hepatitis and fibrosis was seen.  Her platelets are normal.  There are no recent coags.  He has not vomited blood.  She has been kept n.p.o.  She only has one IV in the right arm a 20-gauge which is working well they have been multiple attempts to try to get a second IV that were unsuccessful.  She is chronically tracheostomy dependent with obesity hypoventilation syndrome.  Past Medical History:  Diagnosis Date  . Abnormal LFTs (liver function tests)    Liver U/S and exam c/w HSM. Hep B serology neg. but Hep C ab +, HIV neg. 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 Anemia   . Cardiomyopathy, nonischemic ([place])    a. Varying EF over the years - initially 35% in 09/2010. Normal cors 12/2010. b. Echo 07/2014: EF 55-60%,  no RWMA, + diastolic flattening and systolic flattening c/w RV volume and pressure overload, mild LAE/RAE, mod dilated RV, mod TR, PASP severely increased at 6mmHg. c. RHC 07/2014: moderate pulmonary HTN likely WHO group 3 with markedly elevated CVP and relatively normal left sided pressures.  . Chronic diastolic CHF (congestive heart failure) ([place])   . Chronic respiratory failure ([place])    a. became O2 dependent in July 2011. b. She required trache placement in 07/2013 and has been followed by pulmonology as well.  . CKD (chronic kidney  disease), stage II   . Complication of anesthesia    " difficult waking "  . Diabetes mellitus    insulin dependent  . Diabetic retinopathy    Right eye 2/11  . Essential hypertension   . Health maintenance examination    Mammogram 05/2010 Negative; Last Pap smear 03/2008; Last DM eye exam 2/11> mild non-proliferative diabetic retinopathy. OD  . Helicobacter pylori ab+ 05/2011   Pt was symptomatic and treatment planned for 05/2011  . Hx of cardiac cath 2/08   No CAD, no RAS,  normal EF  . Hypoxemia    CT angiogram 9/11>> No PE; PFTs 10/11- FEV1 1.20 (49%) with 16% better p B2, DLCO 33%> corrects to 84; O2 sats ok on 4 lpm X rapid walk X 3 laps 05/2010  . Long term current use of systemic steroids   . Morbid obesity ([place])    Target wt= 153 for BMI <30  . Obesity hypoventilation syndrome ([place])   . Pulmonary hypertension ([place])   . QT prolongation   . Sarcoidosis    Followed by Dr. Melvyn Novas; w/ liver involvement per biopsy 12/09, Reversible airway component so started on [facility] 01/2010; HFA 75% p coaching 05/2010  . Seborrheic dermatitis of scalp   . Sleep apnea   . Tobacco abuse     Past Surgical History:  Procedure Laterality Date  . BREAST SURGERY    . CESAREAN SECTION    . FASCIOTOMY Left 05/03/2018   Procedure: FOUR COMPARTMENT FASCIOTOMY, LEFT LOWER LEG;  Surgeon: [name], MD;  Location: [place];  Service: Vascular;  Laterality: Left;  . FASCIOTOMY CLOSURE Left 05/06/2018   Procedure: FASCIOTOMY CLOSURE OF LEFT LEG;  Surgeon: Marty Heck, MD;  Location: [place];  Service: Vascular;  Laterality: Left;  . RIGHT HEART CATHETERIZATION N/A 08/12/2014   Procedure: RIGHT HEART CATH;  Surgeon: Jolaine Artist, MD;  Location[facility] C[facility] LAB;  Service: Cardiovascular;  Laterality: N/A;  . TRACHEOSTOMY TUBE PLACEMENT N/A 08/10/2013   Procedure: TRACHEOSTOMY;  Surgeon: Melissa Montane, MD;  [facility];  Service: ENT;  Laterality: N/A;  . TUBAL LIGATION      Family History  Problem  Relation Age of Onset  . Cancer Mother        colon cancer  . Multiple sclerosis Father   . Diabetes Father   . Asthma Sister        in childhood  . Hypertension Other      Social History   Tobacco Use  . Smoking status: Current Some Day Smoker    Packs/day: 0.33    Years: 20.00    Pack years: 6.60    Types: Cigarettes    Start date: 10/05/2014  . Smokeless tobacco: Never Used  . Tobacco comment: 1 pack every 3 days  Substance Use Topics  . Alcohol use: No    Alcohol/week: 0.0 standard drinks  . Drug use: No    Prior  to Admission medications   Medication Sig Start Date End Date Taking? Authorizing Provider  amLODipine (NORVASC) 10 MG tablet Take 1 tablet (10 mg total) by mouth daily. 01/22/18 01/22/19 Yes Alphonzo Grieve, MD  carvedilol (COREG) 6.25 MG tablet Take 1 tablet (6.25 mg total) by mouth 2 (two) times daily. 07/23/17  Yes Lorella Nimrod, MD  glipiZIDE (GLUCOTROL) 5 MG tablet Take 0.5 tablets (2.5 mg total) by mouth daily before breakfast. 04/16/18  Yes Neva Seat, MD  insulin detemir (LEVEMIR) 100 UNIT/ML injection ADMINISTER 50 UNITS UNDER THE SKIN AT BEDTIME 04/16/18  Yes Neva Seat, MD  liraglutide (VICTOZA) 18 MG/3ML SOPN Inject 0.3 mLs (1.8 mg total) into the skin daily. 05/07/17  Yes Alphonzo Grieve, MD  Pseudoephedrine-DM-GG (ROBITUSSIN COLD & COUGH PO) Take 15 mLs by mouth daily as needed (cough).   Yes [provider]  ranitidine (ZANTAC) 150 MG tablet Take 1 tablet (150 mg total) by mouth 2 (two) times daily. 04/11/18 04/11/19 Yes [name], NP  atorvastatin (LIPITOR) 40 MG tablet Take 1 tablet (40 mg total) by mouth daily. Patient not taking: Reported on 04/30/2018 01/17/18   Neva Seat, MD  BD INSULIN SYRINGE U/F 30G X 1/2" 0.5 ML MISC USE AS DIRECTED 04/16/18   Neva Seat, MD  glucose blood test strip Use to check blood sugar 3 times daily. Dx code E11.65. Insulin dependent 04/17/18   Neva Seat, MD  Insulin Pen  Needle (B-D UF III MINI PEN NEEDLES) 31G X 5 MM MISC USE TWICE A DAY 11/22/17   Neva Seat, MD    Current Facility-Administered Medications  Medication Dose Route Frequency Provider Last Rate Last Dose  . 0.45 % sodium chloride infusion   Intravenous Continuous [name], NP   Stopped at 05/12/18 [phone]  . albuterol (PROVENTIL) (2.5 MG/3ML) 0.083% nebulizer solution 2.5 mg  2.5 mg Nebulization Q2H PRN [name], NP   2.5 mg at 05/05/18 1951  . bisacodyl (DULCOLAX) suppository 10 mg  10 mg Rectal Daily PRN [name], NP   10 mg at 05/11/18 1531  . fluticasone (FLONASE) 50 MCG/ACT nasal spray 1 spray  1 spray Each Nare Daily [name], NP   1 spray at 05/12/18 0835  . insulin aspart (novoLOG) injection 0-20 Units  0-20 Units Subcutaneous TID AC & HS [name], NP   11 Units at 05/12/18 1236  . insulin aspart (novoLOG) injection 4 Units  4 Units Subcutaneous TID WC Welford Roche, MD   4 Units at 05/12/18 1236  . insulin detemir (LEVEMIR) injection 30 Units  30 Units Subcutaneous QHS Welford Roche, MD   30 Units at 05/11/18 2231  . lidocaine (LIDODERM) 5 % 2 patch  2 patch Transdermal Q24H [name], NP   2 patch at 05/12/18 1244  . linezolid (ZYVOX) tablet 600 mg  600 mg Oral Q12H Santos-Sanchez, Merlene Morse, MD   600 mg at 05/12/18 0836  . MEDLINE mouth rinse  15 mL Mouth Rinse BID [name], NP   15 mL at 05/11/18 2215  . ondansetron (ZOFRAN) injection 4 mg  4 mg Intravenous Q6H PRN Welford Roche, MD   4 mg at 05/11/18 1517  . oxyCODONE (Oxy IR/ROXICODONE) immediate release tablet 10 mg  10 mg Oral Q4H PRN Welford Roche, MD   10 mg at 05/12/18 0529  . pantoprazole (PROTONIX) injection 40 mg  40 mg Intravenous Q12H Welford Roche, MD   40 mg at 05/12/18 0836  .  sodium chloride flush (NS) 0.9 % injection 3 mL  3 mL Intravenous Q12H [name], NP   3 mL at 05/12/18 1238    Allergies as of 04/30/2018 -  Review Complete 04/30/2018  Allergen Reaction Noted  . Vicodin [hydrocodone-acetaminophen] Itching 11/17/2010     Review of Systems:    This is positive for those things mentioned in the HPI All other review of systems are negative.       Physical Exam:  Vital signs in last 24 hours: Temp:  [97.7 F (36.5 C)-98.7 F (37.1 C)] 97.9 F (36.6 C) (01/12 1624) Pulse Rate:  [72-113] 72 (01/12 1624) Resp:  [18-20] 18 (01/12 1624) BP: (78-140)/(55-88) 140/77 (01/12 1624) SpO2:  [94 %-100 %] 100 % (01/12 1624) FiO2 (%):  [28 %-35 %] 35 % (01/12 0400) Weight:  [100.7 kg] 100.7 kg (01/12 0500) Last BM Date: 05/12/18  General:  Morbidly obese, chronically ill bright and alert Eyes:  anicteric. ENT:   Mouth and posterior pharynx free of lesions.  Neck:   Tracheostomy in place Lungs: Clear to auscultation bilaterally.  Anterior auscultation Heart:   S1S2, no rubs, murmurs, gallops. Abdomen: Obese soft, non-tender, no hepatosplenomegaly, hernia, or mass and BS+.  Extremities:  Fasciotomy wounds, stapled left leg Neuro:  A&O x 3.  Psych:  appropriate mood and  Affect.   Data Reviewed:   LAB RESULTS: Recent Labs    05/11/18 0743 05/12/18 0601  WBC 16.4* 16.7*  HGB 8.3* 5.0*  HCT 27.5* 16.2*  PLT 278 280   BMET Recent Labs    05/10/18 0649 05/11/18 0620 05/12/18 0601  NA 138 137 133*  K 4.7 4.9 5.2*  CL 104 101 97*  CO2 21* 23 23  GLUCOSE 96 187* 258*  BUN 46* 54* 126*  CREATININE 3.34* 3.43* 4.68*  CALCIUM 8.5* 8.6* 8.0*   LFT Recent Labs    05/12/18 0601  PROT 6.5  ALBUMIN 2.1*  AST 26  ALT 21  ALKPHOS 140*  BILITOT 0.3       Thanks   LOS: 11 days   @  Simonne Maffucci, MD, [facility] @  05/12/2018, 5:15 PM

## 2018-05-12 NOTE — Progress Notes (Signed)
Patient has one IV access. Attempts to get additional access was unsuccessful X 10 between nursing staff and IV team. Attending MD paged due to patient still needing 2 units of blood and a fluid bolus. I was advised to continue using current PIV for now.

## 2018-05-12 NOTE — Procedures (Signed)
Procedure: Placement of a triple lumen catheter  Indication: Venous access in patient with GIB  Consent: Risks of procedure as well as the alternatives and risks of each were explained to the (patient).  Consent for procedure obtained. Time Out: Verified patient identification, verified procedure, site/side was marked, verified correct patient position, special equipment/implants available, medications/allergies/relevent history reviewed, required imaging and test results available.  Performed  Maximum sterile technique was used including antiseptics, cap, gloves, gown, hand hygiene, mask and sheet. Skin prep: Chlorhexidine; local anesthetic administered A antimicrobial bonded/coated triple lumen catheter was placed in the right internal jugular vein using the Seldinger technique with U/S guidance.  The patient tolerated the procedure well. A PCXR will be obtained to verify catheter position and for evidence of a ptx.

## 2018-05-12 NOTE — Progress Notes (Addendum)
CSW received consult for assisted living placement. CSW notes PT recommendation CIR which CSW is not typically involved in. CSW is signing off, please re-consult if any social work needs arise.  Lamonte Richer, LCSW, [place] Worker II [phone]

## 2018-05-12 NOTE — Progress Notes (Signed)
Subjective:  No acute events overnight. Melody Gray reported dark emesis and several episodes of melena overnight. She also endorses an upset stomach. She, however, states that she is feeling overall somewhat better than yesterday. She worries about how well she will do at home once she is discharged.   Objective:  Vital signs in last 24 hours: Vitals:   05/12/18 0000 05/12/18 0359 05/12/18 0400 05/12/18 0500  BP: (!) 78/55  95/60   Pulse: 97  88   Resp:  20    Temp: 98.6 F (37 C)  98.6 F (37 C)   TempSrc: Axillary  Axillary   SpO2: 94%  94%   Weight:    100.7 kg  Height:       General: chronically ill appearing female, in bed in NAD  CV: RRR, no mrg  Pulm: coarse breath sounds throughout, no wheezes or crackles Ext: warm and well perfused, medial and lateral incisions on LLE healing well and without signs of infection, 1st R MTP with less swelling and warmth and without erythema   Assessment/Plan:  Principal Problem:   MRSA bacteremia Active Problems:   Insulin-requiring or dependent type II diabetes mellitus (HCC)   Hypertension complicating diabetes (HCC)   Acute on chronic respiratory failure with hypoxemia (HCC)   Acute kidney injury (AKI) with acute tubular necrosis (ATN) (HCC)   Chronic respiratory failure with hypoxia (HCC)/ trach dep   Hyperkalemia   Chronic pulmonary hypertension (HCC)   Cardiac arrest (HCC)   Nontraumatic compartment syndrome of left lower extremity   Pneumonia   Morbid obesity ([place])   Tobacco use disorder  Melody Gray is a 48 yo F with history of sarcoidosis with pulmonary and hepatic involvement, chronic hypoxic respiratory failure 2/2 sarcoidosis and severe OSA, difficult airway from prior traumatic intubation, CKD IV, tobacco use disorder who presented with AoC HF exacerbation and had a prolonged PEA arrest 2/2 hyperkalemia. Hospital course was complicated by multifocal PNA for which she has completed tx and MRSA bacteremia. She is doing well  and stable for discharge to CIR.   # Upper GI bleed: Several episodes of melena and dark emesis overnight.  Hemoglobin has dropped by 3 g this morning.  Suspect upper GI bleed likely from gastric/duodenal ulcer in the setting of acute illness as well as initiation of oral steroids 2 days ago for gout flare.  Order 1L NS bolus, 3 units of PRBCs and IV protonix 40 mg BID.  Will monitor volume status given her advanced CKD.   # Acute on chronic hypoxic respiratory failure 2/2 acute on chronic HFpEF exacerbation: Extubated on 1/9. Now on her home 2 L of O2 [place] as well as size 4 cuffless trach and speaking valve.  Respiratory status remains stable.   # MRSA bacteremia: Continue PO Zyvox x 5 days.   # AoCKD Stage IV with hyperkalemia: 2/2 CKD progression vs ATN. Baseline Cr 2.4-2.7. Renal function acutely worsened today in the setting of active bleeding with Cr 3.43->4.68 and mildly elevated K at 5.2. IVF and transfusing as above.  Will repeat BMP in PM.   # R foot gout flare: Improving. Steroids discontinued in the setting of upper GI bleed from likely gastric ulcer.   # Constipation: Resolved.  # Chest wall pain: 2/2 CPR. Continue oxy 10 q4h PRN. Bowel regimen as above.   # IDDM: CBG above goal 200-300s.  - Levemir to 30 units + SSI-R - Start Novolog 4U TID with meals    #  Renal mass: Radiology recommends MRI in 68mo or [name] if patient unable to undergo MRI.   Dispo: Anticipated discharge in approximately 2-4 day(s) pending GI evaluation for upper GI bleeding.   Melody Roche, MD 05/12/2018, 6:12 AM Pager: [phone]

## 2018-05-12 NOTE — Progress Notes (Signed)
Spoke with GI. Patient will go for endoscopy tomorrow. Concerns with IV access. Will consult PCCM for central line placement.

## 2018-05-12 NOTE — H&P (View-Only) (Signed)
Consultation  Referring Provider:     Medical teaching service Primary Care Physician:  Neva Seat, MD Primary Gastroenterologist:    Previous patient of Dr. Deatra Ina Reason for Consultation:     GI bleed     Impression / Plan:   GI bleeding suspect upper bleed with melena.  Hemoglobin 5 this a.m.  8.3 yesterday. She is at risk for variceal bleeding given cirrhosis, stress gastric ulcer gastritis bleeding and other ischemic bleeding given her recent pulseless electrical activity arrest. 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  She is improved. Would be helpful if she could have better IV access.  If she gets shocky and needs more aggressive support then the IV she has will probably not be enough.  I have spoken to the covering resident and he will discuss possible central line with CCM.  Multiple attempts at IVs have been unsuccessful and a PICC line is not appropriate with her chronic kidney disease.  Given that she has the cirrhosis pattern on imaging a dose of IV ceftriaxone could be considered but she is on other antibiotics so will not do so.  Plan for an upper GI endoscopy tomorrow by Dr. Ardis Hughs.The risks and benefits as well as alternatives of endoscopic procedure(s) have been discussed and reviewed. All questions answered. The patient agrees to proceed.  Check INR with next hemoglobin  Continue PPI  Gatha Mayer, MD, [facility] Gastroenterology 05/12/2018 5:32 PM Pager [phone]          HPI:   Melody Gray is a 48 y.o. female who had melena overnight and a hemoglobin from 8 23-5.  She had been admitted to the hospital April 30, 2018 because of new onset renal failure.  On January 3  she had a pulseless electrical activity arrest I believe associated with hyperkalemia, and had an intraosseous line placed in the left leg, and had a long period of time with coding, and difficulty oxygenating and accessing her airway through her trach but was eventually accomplished. Required left leg fasciotomy for release of compartment syndrome secondary to intraosseous line.  She was transferred to the medical teaching service yesterday.  Overnight she had several melenic stools and her hemoglobin was 5 though her vital signs were relatively stable.  No abdominal pain and there is no history of GI bleeding in the past and she is never had an endoscopic evaluation.  She does have a history of hepatomegaly and liver biopsy in 2009 previously seen by my former partner Dr. Deatra Ina, and granulomatous hepatitis and fibrosis was seen.  Her platelets are normal.  There are no recent coags.  He has not vomited blood.  She has been kept n.p.o.  She only has one IV in the right arm a 20-gauge which is working well they have been multiple attempts to try to get a second IV that were unsuccessful.  She is chronically tracheostomy dependent with obesity hypoventilation syndrome.  Is being treated for MRSA bacteremia that developed in setting of bilateral pneumonia.  Past Medical History:  Diagnosis Date  . Abnormal LFTs (liver function tests)    Liver U/S and exam c/w HSM. Hep B serology neg. but Hep C ab +, HIV neg. AMA and Hep  C viral load neg.; Liver biopsy 12/09 c/w liver sarcoid and portal fibrosis  . Anemia   . Cardiomyopathy, nonischemic ([place])    a. Varying EF over the years - initially 35% in 09/2010. Normal cors 12/2010. b. Echo 07/2014: EF 55-60%, no RWMA, + diastolic flattening and systolic flattening c/w RV volume and pressure overload, mild LAE/RAE, mod dilated RV, mod TR, PASP severely increased at 2mmHg. c. RHC 07/2014: moderate pulmonary HTN likely WHO group 3 with markedly elevated CVP and relatively  normal left sided pressures.  . Chronic diastolic CHF (congestive heart failure) ([place])   . Chronic respiratory failure ([place])    a. became O2 dependent in July 2011. b. She required trache placement in 07/2013 and has been followed by pulmonology as well.  . CKD (chronic kidney disease), stage II   . Complication of anesthesia    " difficult waking "  . Diabetes mellitus    insulin dependent  . Diabetic retinopathy    Right eye 2/11  . Essential hypertension   . Health maintenance examination    Mammogram 05/2010 Negative; Last Pap smear 03/2008; Last DM eye exam 2/11> mild non-proliferative diabetic retinopathy. OD  . Helicobacter pylori ab+ 05/2011   Pt was symptomatic and treatment planned for 05/2011  . Hx of cardiac cath 2/08   No CAD, no RAS,  normal EF  . Hypoxemia    CT angiogram 9/11>> No PE; PFTs 10/11- FEV1 1.20 (49%) with 16% better p B2, DLCO 33%> corrects to 84; O2 sats ok on 4 lpm X rapid walk X 3 laps 05/2010  . Long term current use of systemic steroids   . Morbid obesity ([place])    Target wt= 153 for BMI <30  . Obesity hypoventilation syndrome ([place])   . Pulmonary hypertension ([place])   . QT prolongation   . Sarcoidosis    Followed by Dr. Melvyn Novas; w/ liver involvement per biopsy 12/09, Reversible airway component so started on [facility] 01/2010; HFA 75% p coaching 05/2010  . Seborrheic dermatitis of scalp   . Sleep apnea   . Tobacco abuse     Past Surgical History:  Procedure Laterality Date  . BREAST SURGERY    . CESAREAN SECTION    . FASCIOTOMY Left 05/03/2018   Procedure: FOUR COMPARTMENT FASCIOTOMY, LEFT LOWER LEG;  Surgeon: [name], MD;  Location: [facility];  Service: Vascular;  Laterality: Left;  . FASCIOTOMY CLOSURE Left 05/06/2018   Procedure: FASCIOTOMY CLOSURE OF LEFT LEG;  Surgeon: Marty Heck, MD;  Location: [place];  Service: Vascular;  Laterality: Left;  . RIGHT HEART CATHETERIZATION N/A 08/12/2014   Procedure: RIGHT HEART CATH;  Surgeon: Jolaine Artist, MD;  Location: [facility] CATH LAB;  Service: Cardiovascular;  Laterality: N/A;  . TRACHEOSTOMY TUBE PLACEMENT N/A 08/10/2013   Procedure: TRACHEOSTOMY;  Surgeon: Melissa Montane, MD;  Location: [facility] OR;  Service: ENT;  Laterality: N/A;  . TUBAL LIGATION      Family History  Problem Relation Age of Onset  . Cancer Mother        colon cancer  . Multiple sclerosis Father   . Diabetes Father   . Asthma Sister        in childhood  . Hypertension Other     Social History   Tobacco Use  . Smoking status: Current Some Day Smoker    Packs/day: 0.33    Years: 20.00    Pack years: 6.60    Types: Cigarettes  Start date: 10/05/2014  . Smokeless tobacco: Never Used  . Tobacco comment: 1 pack every 3 days  Substance Use Topics  . Alcohol use: No    Alcohol/week: 0.0 standard drinks  . Drug use: No    Prior to Admission medications   Medication Sig Start Date End Date Taking? Authorizing Provider  amLODipine (NORVASC) 10 MG tablet Take 1 tablet (10 mg total) by mouth daily. 01/22/18 01/22/19 Yes Alphonzo Grieve, MD  carvedilol (COREG) 6.25 MG tablet Take 1 tablet (6.25 mg total) by mouth 2 (two) times daily. 07/23/17  Yes Lorella Nimrod, MD  glipiZIDE (GLUCOTROL) 5 MG tablet Take 0.5 tablets (2.5 mg total) by mouth daily before breakfast. 04/16/18  Yes Neva Seat, MD  insulin detemir (LEVEMIR) 100 UNIT/ML injection ADMINISTER 50 UNITS UNDER THE SKIN AT BEDTIME 04/16/18  Yes Neva Seat, MD  liraglutide (VICTOZA) 18 MG/3ML SOPN Inject 0.3 mLs (1.8 mg total) into the skin daily. 05/07/17  Yes Alphonzo Grieve, MD  Pseudoephedrine-DM-GG (ROBITUSSIN COLD & COUGH PO) Take 15 mLs by mouth daily as needed (cough).   Yes [provider]  ranitidine (ZANTAC) 150 MG tablet Take 1 tablet (150 mg total) by mouth 2 (two) times daily. 04/11/18 04/11/19 Yes [name], NP  atorvastatin (LIPITOR) 40 MG tablet Take 1 tablet (40 mg total) by mouth daily. Patient not taking: Reported on  04/30/2018 01/17/18   Neva Seat, MD  BD INSULIN SYRINGE U/F 30G X 1/2" 0.5 ML MISC USE AS DIRECTED 04/16/18   Neva Seat, MD  glucose blood test strip Use to check blood sugar 3 times daily. Dx code E11.65. Insulin dependent 04/17/18   Neva Seat, MD  Insulin Pen Needle (B-D UF III MINI PEN NEEDLES) 31G X 5 MM MISC USE TWICE A DAY 11/22/17   Neva Seat, MD    Current Facility-Administered Medications  Medication Dose Route Frequency Provider Last Rate Last Dose  . 0.45 % sodium chloride infusion   Intravenous Continuous [name], NP   Stopped at 05/12/18 [phone]  . albuterol (PROVENTIL) (2.5 MG/3ML) 0.083% nebulizer solution 2.5 mg  2.5 mg Nebulization Q2H PRN [name], NP   2.5 mg at 05/05/18 1951  . bisacodyl (DULCOLAX) suppository 10 mg  10 mg Rectal Daily PRN [name], NP   10 mg at 05/11/18 1531  . fluticasone (FLONASE) 50 MCG/ACT nasal spray 1 spray  1 spray Each Nare Daily [name], NP   1 spray at 05/12/18 0835  . insulin aspart (novoLOG) injection 0-20 Units  0-20 Units Subcutaneous TID AC & HS [name], NP   11 Units at 05/12/18 1236  . insulin aspart (novoLOG) injection 4 Units  4 Units Subcutaneous TID WC Welford Roche, MD   4 Units at 05/12/18 1236  . insulin detemir (LEVEMIR) injection 30 Units  30 Units Subcutaneous QHS Welford Roche, MD   30 Units at 05/11/18 2231  . lidocaine (LIDODERM) 5 % 2 patch  2 patch Transdermal Q24H [name], NP   2 patch at 05/12/18 1244  . linezolid (ZYVOX) tablet 600 mg  600 mg Oral Q12H Santos-Sanchez, Merlene Morse, MD   600 mg at 05/12/18 0836  . MEDLINE mouth rinse  15 mL Mouth Rinse BID [name], NP   15 mL at 05/11/18 2215  . ondansetron (ZOFRAN) injection 4 mg  4 mg Intravenous Q6H PRN Welford Roche, MD   4 mg at 05/11/18 1517  . oxyCODONE (Oxy IR/ROXICODONE) immediate release  tablet 10 mg  10 mg Oral Q4H PRN Welford Roche, MD   10 mg at  05/12/18 0529  . pantoprazole (PROTONIX) injection 40 mg  40 mg Intravenous Q12H Welford Roche, MD   40 mg at 05/12/18 0836  . sodium chloride flush (NS) 0.9 % injection 3 mL  3 mL Intravenous Q12H [name], NP   3 mL at 05/12/18 1238    Allergies as of 04/30/2018 - Review Complete 04/30/2018  Allergen Reaction Noted  . Vicodin [hydrocodone-acetaminophen] Itching 11/17/2010     Review of Systems:    This is positive for those things mentioned in the HPI All other review of systems are negative.       Physical Exam:  Vital signs in last 24 hours: Temp:  [97.7 F (36.5 C)-98.7 F (37.1 C)] 97.9 F (36.6 C) (01/12 1624) Pulse Rate:  [72-113] 72 (01/12 1624) Resp:  [18-20] 18 (01/12 1624) BP: (78-140)/(55-88) 140/77 (01/12 1624) SpO2:  [94 %-100 %] 100 % (01/12 1624) FiO2 (%):  [28 %-35 %] 35 % (01/12 0400) Weight:  [100.7 kg] 100.7 kg (01/12 0500) Last BM Date: 05/12/18  General:  Morbidly obese, chronically ill bright and alert Eyes:  anicteric. ENT:   Mouth and posterior pharynx free of lesions.  Neck:   Tracheostomy in place Lungs: Clear to auscultation bilaterally.  Anterior auscultation Heart:   S1S2, no rubs, murmurs, gallops. Abdomen: Obese soft, non-tender, no hepatosplenomegaly, hernia, or mass and BS+.  Extremities:  Fasciotomy wounds, stapled left leg Neuro:  A&O x 3.  Psych:  appropriate mood and  Affect.   Data Reviewed:   LAB RESULTS: Recent Labs    05/11/18 0743 05/12/18 0601  WBC 16.4* 16.7*  HGB 8.3* 5.0*  HCT 27.5* 16.2*  PLT 278 280   BMET Recent Labs    05/10/18 0649 05/11/18 0620 05/12/18 0601  NA 138 137 133*  K 4.7 4.9 5.2*  CL 104 101 97*  CO2 21* 23 23  GLUCOSE 96 187* 258*  BUN 46* 54* 126*  CREATININE 3.34* 3.43* 4.68*  CALCIUM 8.5* 8.6* 8.0*   LFT Recent Labs    05/12/18 0601  PROT 6.5  ALBUMIN 2.1*  AST 26  ALT 21  ALKPHOS 140*  BILITOT 0.3       Thanks   LOS: 11 days   @Carl  Simonne Maffucci, MD, [facility] @  05/12/2018, 5:32 PM

## 2018-05-12 NOTE — Progress Notes (Signed)
Consult was placed to obtain additional iv for more access;  Pt seen and attempted by 2 IV Team RNs using ultrasound;  Very poor veins;  Veins do not compress;  Pt has 1 piv at present and is receiving blood at this time.  RN aware;  4N staff also attempted .  Raynelle Fanning RN IV Team

## 2018-05-12 NOTE — Consult Note (Signed)
Consultation  Referring Provider:     Medical teaching service Primary Care Physician:  Neva Seat, MD Primary Gastroenterologist:    Previous patient of Dr. Deatra Ina Reason for Consultation:     GI bleed     Impression / Plan:   GI bleeding suspect upper bleed with melena.  Hemoglobin 5 this a.m.  8.3 yesterday. She is at risk for variceal bleeding given cirrhosis, stress gastric ulcer gastritis bleeding and other ischemic bleeding given her recent pulseless electrical activity arrest. 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  She is improved. Would be helpful if she could have better IV access.  If she gets shocky and needs more aggressive support then the IV she has will probably not be enough.  I have spoken to the covering resident and he will discuss possible central line with CCM.  Multiple attempts at IVs have been unsuccessful and a PICC line is not appropriate with her chronic kidney disease.  Given that she has the cirrhosis pattern on imaging a dose of IV ceftriaxone could be considered but she is on other antibiotics so will not do so.  Plan for an upper GI endoscopy tomorrow by Dr. Ardis Hughs.The risks and benefits as well as alternatives of endoscopic procedure(s) have been discussed and reviewed. All questions answered. The patient agrees to proceed.  Check INR with next hemoglobin  Continue PPI  Gatha Mayer, MD, [facility] Gastroenterology 05/12/2018 5:32 PM Pager [phone]          HPI:   Melody Gray is a 48 y.o. female who had melena overnight and a hemoglobin from 8 23-5.  She had been admitted to the hospital April 30, 2018 because of new onset renal failure.  On January 3  she had a pulseless electrical activity arrest I believe associated with hyperkalemia, and had an intraosseous line placed in the left leg, and had a long period of time with coding, and difficulty oxygenating and accessing her airway through her trach but was eventually accomplished. Required left leg fasciotomy for release of compartment syndrome secondary to intraosseous line.  She was transferred to the medical teaching service yesterday.  Overnight she had several melenic stools and her hemoglobin was 5 though her vital signs were relatively stable.  No abdominal pain and there is no history of GI bleeding in the past and she is never had an endoscopic evaluation.  She does have a history of hepatomegaly and liver biopsy in 2009 previously seen by my former partner Dr. Deatra Ina, and granulomatous hepatitis and fibrosis was seen.  Her platelets are normal.  There are no recent coags.  He has not vomited blood.  She has been kept n.p.o.  She only has one IV in the right arm a 20-gauge which is working well they have been multiple attempts to try to get a second IV that were unsuccessful.  She is chronically tracheostomy dependent with obesity hypoventilation syndrome.  Is being treated for MRSA bacteremia that developed in setting of bilateral pneumonia.  Past Medical History:  Diagnosis Date  . Abnormal LFTs (liver function tests)    Liver U/S and exam c/w HSM. Hep B serology neg. but Hep C ab +, HIV neg. AMA and Hep  C viral load neg.; Liver biopsy 12/09 c/w liver sarcoid and portal fibrosis  . Anemia   . Cardiomyopathy, nonischemic ([place])    a. Varying EF over the years - initially 35% in 09/2010. Normal cors 12/2010. b. Echo 07/2014: EF 55-60%, no RWMA, + diastolic flattening and systolic flattening c/w RV volume and pressure overload, mild LAE/RAE, mod dilated RV, mod TR, PASP severely increased at 53mmHg. c. RHC 07/2014: moderate pulmonary HTN likely WHO group 3 with markedly elevated CVP and relatively  normal left sided pressures.  . Chronic diastolic CHF (congestive heart failure) ([place])   . Chronic respiratory failure ([place])    a. became O2 dependent in July 2011. b. She required trache placement in 07/2013 and has been followed by pulmonology as well.  . CKD (chronic kidney disease), stage II   . Complication of anesthesia    " difficult waking "  . Diabetes mellitus    insulin dependent  . Diabetic retinopathy    Right eye 2/11  . Essential hypertension   . Health maintenance examination    Mammogram 05/2010 Negative; Last Pap smear 03/2008; Last DM eye exam 2/11> mild non-proliferative diabetic retinopathy. OD  . Helicobacter pylori ab+ 05/2011   Pt was symptomatic and treatment planned for 05/2011  . Hx of cardiac cath 2/08   No CAD, no RAS,  normal EF  . Hypoxemia    CT angiogram 9/11>> No PE; PFTs 10/11- FEV1 1.20 (49%) with 16% better p B2, DLCO 33%> corrects to 84; O2 sats ok on 4 lpm X rapid walk X 3 laps 05/2010  . Long term current use of systemic steroids   . Morbid obesity ([place])    Target wt= 153 for BMI <30  . Obesity hypoventilation syndrome ([place])   . Pulmonary hypertension ([place])   . QT prolongation   . Sarcoidosis    Followed by Dr. Melvyn Novas; w/ liver involvement per biopsy 12/09, Reversible airway component so started on [facility] 01/2010; HFA 75% p coaching 05/2010  . Seborrheic dermatitis of scalp   . Sleep apnea   . Tobacco abuse     Past Surgical History:  Procedure Laterality Date  . BREAST SURGERY    . CESAREAN SECTION    . FASCIOTOMY Left 05/03/2018   Procedure: FOUR COMPARTMENT FASCIOTOMY, LEFT LOWER LEG;  Surgeon: [name], MD;  Location: [place];  Service: Vascular;  Laterality: Left;  . FASCIOTOMY CLOSURE Left 05/06/2018   Procedure: FASCIOTOMY CLOSURE OF LEFT LEG;  Surgeon: Marty Heck, MD;  Location: [place];  Service: Vascular;  Laterality: Left;  . RIGHT HEART CATHETERIZATION N/A 08/12/2014   Procedure: RIGHT HEART CATH;  Surgeon: Jolaine Artist, MD;  Location: [facility] CATH LAB;  Service: Cardiovascular;  Laterality: N/A;  . TRACHEOSTOMY TUBE PLACEMENT N/A 08/10/2013   Procedure: TRACHEOSTOMY;  Surgeon: Melissa Montane, MD;  Location: [facility] OR;  Service: ENT;  Laterality: N/A;  . TUBAL LIGATION      Family History  Problem Relation Age of Onset  . Cancer Mother        colon cancer  . Multiple sclerosis Father   . Diabetes Father   . Asthma Sister        in childhood  . Hypertension Other     Social History   Tobacco Use  . Smoking status: Current Some Day Smoker    Packs/day: 0.33    Years: 20.00    Pack years: 6.60    Types: Cigarettes  Start date: 10/05/2014  . Smokeless tobacco: Never Used  . Tobacco comment: 1 pack every 3 days  Substance Use Topics  . Alcohol use: No    Alcohol/week: 0.0 standard drinks  . Drug use: No    Prior to Admission medications   Medication Sig Start Date End Date Taking? Authorizing Provider  amLODipine (NORVASC) 10 MG tablet Take 1 tablet (10 mg total) by mouth daily. 01/22/18 01/22/19 Yes Alphonzo Grieve, MD  carvedilol (COREG) 6.25 MG tablet Take 1 tablet (6.25 mg total) by mouth 2 (two) times daily. 07/23/17  Yes Lorella Nimrod, MD  glipiZIDE (GLUCOTROL) 5 MG tablet Take 0.5 tablets (2.5 mg total) by mouth daily before breakfast. 04/16/18  Yes Neva Seat, MD  insulin detemir (LEVEMIR) 100 UNIT/ML injection ADMINISTER 50 UNITS UNDER THE SKIN AT BEDTIME 04/16/18  Yes Neva Seat, MD  liraglutide (VICTOZA) 18 MG/3ML SOPN Inject 0.3 mLs (1.8 mg total) into the skin daily. 05/07/17  Yes Alphonzo Grieve, MD  Pseudoephedrine-DM-GG (ROBITUSSIN COLD & COUGH PO) Take 15 mLs by mouth daily as needed (cough).   Yes [provider]  ranitidine (ZANTAC) 150 MG tablet Take 1 tablet (150 mg total) by mouth 2 (two) times daily. 04/11/18 04/11/19 Yes [name], NP  atorvastatin (LIPITOR) 40 MG tablet Take 1 tablet (40 mg total) by mouth daily. Patient not taking: Reported on  04/30/2018 01/17/18   Neva Seat, MD  BD INSULIN SYRINGE U/F 30G X 1/2" 0.5 ML MISC USE AS DIRECTED 04/16/18   Neva Seat, MD  glucose blood test strip Use to check blood sugar 3 times daily. Dx code E11.65. Insulin dependent 04/17/18   Neva Seat, MD  Insulin Pen Needle (B-D UF III MINI PEN NEEDLES) 31G X 5 MM MISC USE TWICE A DAY 11/22/17   Neva Seat, MD    Current Facility-Administered Medications  Medication Dose Route Frequency Provider Last Rate Last Dose  . 0.45 % sodium chloride infusion   Intravenous Continuous [name], NP   Stopped at 05/12/18 [phone]  . albuterol (PROVENTIL) (2.5 MG/3ML) 0.083% nebulizer solution 2.5 mg  2.5 mg Nebulization Q2H PRN [name], NP   2.5 mg at 05/05/18 1951  . bisacodyl (DULCOLAX) suppository 10 mg  10 mg Rectal Daily PRN [name], NP   10 mg at 05/11/18 1531  . fluticasone (FLONASE) 50 MCG/ACT nasal spray 1 spray  1 spray Each Nare Daily [name], NP   1 spray at 05/12/18 0835  . insulin aspart (novoLOG) injection 0-20 Units  0-20 Units Subcutaneous TID AC & HS [name], NP   11 Units at 05/12/18 1236  . insulin aspart (novoLOG) injection 4 Units  4 Units Subcutaneous TID WC Welford Roche, MD   4 Units at 05/12/18 1236  . insulin detemir (LEVEMIR) injection 30 Units  30 Units Subcutaneous QHS Welford Roche, MD   30 Units at 05/11/18 2231  . lidocaine (LIDODERM) 5 % 2 patch  2 patch Transdermal Q24H [name], NP   2 patch at 05/12/18 1244  . linezolid (ZYVOX) tablet 600 mg  600 mg Oral Q12H Santos-Sanchez, Merlene Morse, MD   600 mg at 05/12/18 0836  . MEDLINE mouth rinse  15 mL Mouth Rinse BID [name], NP   15 mL at 05/11/18 2215  . ondansetron (ZOFRAN) injection 4 mg  4 mg Intravenous Q6H PRN Welford Roche, MD   4 mg at 05/11/18 1517  . oxyCODONE (Oxy IR/ROXICODONE) immediate release  tablet 10 mg  10 mg Oral Q4H PRN Welford Roche, MD   10 mg at  05/12/18 0529  . pantoprazole (PROTONIX) injection 40 mg  40 mg Intravenous Q12H Welford Roche, MD   40 mg at 05/12/18 0836  . sodium chloride flush (NS) 0.9 % injection 3 mL  3 mL Intravenous Q12H [name], NP   3 mL at 05/12/18 1238    Allergies as of 04/30/2018 - Review Complete 04/30/2018  Allergen Reaction Noted  . Vicodin [hydrocodone-acetaminophen] Itching 11/17/2010     Review of Systems:    This is positive for those things mentioned in the HPI All other review of systems are negative.       Physical Exam:  Vital signs in last 24 hours: Temp:  [97.7 F (36.5 C)-98.7 F (37.1 C)] 97.9 F (36.6 C) (01/12 1624) Pulse Rate:  [72-113] 72 (01/12 1624) Resp:  [18-20] 18 (01/12 1624) BP: (78-140)/(55-88) 140/77 (01/12 1624) SpO2:  [94 %-100 %] 100 % (01/12 1624) FiO2 (%):  [28 %-35 %] 35 % (01/12 0400) Weight:  [100.7 kg] 100.7 kg (01/12 0500) Last BM Date: 05/12/18  General:  Morbidly obese, chronically ill bright and alert Eyes:  anicteric. ENT:   Mouth and posterior pharynx free of lesions.  Neck:   Tracheostomy in place Lungs: Clear to auscultation bilaterally.  Anterior auscultation Heart:   S1S2, no rubs, murmurs, gallops. Abdomen: Obese soft, non-tender, no hepatosplenomegaly, hernia, or mass and BS+.  Extremities:  Fasciotomy wounds, stapled left leg Neuro:  A&O x 3.  Psych:  appropriate mood and  Affect.   Data Reviewed:   LAB RESULTS: Recent Labs    05/11/18 0743 05/12/18 0601  WBC 16.4* 16.7*  HGB 8.3* 5.0*  HCT 27.5* 16.2*  PLT 278 280   BMET Recent Labs    05/10/18 0649 05/11/18 0620 05/12/18 0601  NA 138 137 133*  K 4.7 4.9 5.2*  CL 104 101 97*  CO2 21* 23 23  GLUCOSE 96 187* 258*  BUN 46* 54* 126*  CREATININE 3.34* 3.43* 4.68*  CALCIUM 8.5* 8.6* 8.0*   LFT Recent Labs    05/12/18 0601  PROT 6.5  ALBUMIN 2.1*  AST 26  ALT 21  ALKPHOS 140*  BILITOT 0.3       Thanks   LOS: 11 days   @Anara Cowman  Simonne Maffucci, MD, [facility] @  05/12/2018, 5:32 PM

## 2018-05-12 NOTE — Progress Notes (Signed)
I had an extensive conversation with the patient about how concerned she is about going home alone because she does not feel like she has any help or support from anyone.  She stated to me that she feels like her health has deteriorated to a point that she cannot take care of herself on her own anymore and she needs someone to be able to assist her with ADL's.  She said that her kinds and friends will state that they will be there for her, but they are too busy with their own lives and rarely have time to help, let alone help as much as she feels she needs.  She also expressed that she is very worried about quitting smoking and she knows she will go back to smoking cigarettes when she gets home unless she has some help with that as well.

## 2018-05-12 NOTE — Progress Notes (Signed)
OT Cancellation Note  Patient Details Name: Melody Gray MRN: [id number] DOB: [birth year]   Cancelled Treatment:    Reason Eval/Treat Not Completed: Medical issues which prohibited therapy, patient's hemoglobin dropped to 5.0 and she is currently receiving PRBCs.  Will hold evaluation until appropriate, will follow.    Delight Stare, OT Acute Rehabilitation Services Pager [phone] Office [phone]   Delight Stare 05/12/2018, 11:44 AM

## 2018-05-12 NOTE — Anesthesia Preprocedure Evaluation (Addendum)
Anesthesia Evaluation  Patient identified by MRN, date of birth, ID band Patient awake    Reviewed: Allergy & Precautions, NPO status , Patient's Chart, lab work & pertinent test results  Airway Mallampati: Trach  TM Distance: >3 FB Neck ROM: Full    Dental no notable dental hx.    Pulmonary sleep apnea and Oxygen sleep apnea , Current Smoker,  Sarcoidosis Tracheostomy   Pulmonary exam normal breath sounds clear to auscultation       Cardiovascular hypertension, Pt. on medications +CHF  Normal cardiovascular exam Rhythm:Regular Rate:Normal  Pulmonary hypertension  ECG: NSR, rate 85  ECHO: LV EF: 55% - 60%   Neuro/Psych PSYCHIATRIC DISORDERS negative neurological ROS     GI/Hepatic Neg liver ROS, GERD  Medicated and Controlled,  Endo/Other  diabetes, Oral Hypoglycemic Agents, Insulin DependentMorbid obesity  Renal/GU CRFRenal disease     Musculoskeletal negative musculoskeletal ROS (+)   Abdominal (+) + obese,   Peds  Hematology  (+) anemia , HLD Central line   Anesthesia Other Findings GI bleed, upper  Reproductive/Obstetrics                            Anesthesia Physical Anesthesia Plan  ASA: III  Anesthesia Plan: MAC   Post-op Pain Management:    Induction: Intravenous  PONV Risk Score and Plan: 1 and Propofol infusion and Treatment may vary due to age or medical condition  Airway Management Planned: Tracheostomy  Additional Equipment:   Intra-op Plan:   Post-operative Plan:   Informed Consent: I have reviewed the patients History and Physical, chart, labs and discussed the procedure including the risks, benefits and alternatives for the proposed anesthesia with the patient or authorized representative who has indicated his/her understanding and acceptance.     Plan Discussed with: CRNA  Anesthesia Plan Comments:        Anesthesia Quick Evaluation

## 2018-05-13 ENCOUNTER — Inpatient Hospital Stay (HOSPITAL_COMMUNITY): Payer: Medicare Other | Admitting: Anesthesiology

## 2018-05-13 ENCOUNTER — Encounter (HOSPITAL_COMMUNITY): Payer: Self-pay | Admitting: *Deleted

## 2018-05-13 ENCOUNTER — Encounter (HOSPITAL_COMMUNITY)
Admission: EM | Disposition: A | Discharge status: Home Health | Payer: Self-pay | Source: Home / Self Care | Attending: Internal Medicine

## 2018-05-13 DIAGNOSIS — Z95828 Presence of other vascular implants and grafts: Secondary | ICD-10-CM

## 2018-05-13 DIAGNOSIS — K254 Chronic or unspecified gastric ulcer with hemorrhage: Secondary | ICD-10-CM | POA: Diagnosis present

## 2018-05-13 DIAGNOSIS — K229 Disease of esophagus, unspecified: Secondary | ICD-10-CM

## 2018-05-13 DIAGNOSIS — B3781 Candidal esophagitis: Secondary | ICD-10-CM | POA: Clinically undetermined

## 2018-05-13 HISTORY — PX: ESOPHAGOGASTRODUODENOSCOPY (EGD) WITH PROPOFOL: SHX5813

## 2018-05-13 HISTORY — PX: BIOPSY: SHX5522

## 2018-05-13 LAB — BASIC METABOLIC PANEL
Anion gap: 11 (ref 5–15)
Anion gap: 9 (ref 5–15)
BUN: 116 mg/dL — ABNORMAL HIGH (ref 6–20)
BUN: 116 mg/dL — ABNORMAL HIGH (ref 6–20)
CO2: 24 mmol/L (ref 22–32)
CO2: 25 mmol/L (ref 22–32)
Calcium: 8.2 mg/dL — ABNORMAL LOW (ref 8.9–10.3)
Calcium: 8.6 mg/dL — ABNORMAL LOW (ref 8.9–10.3)
Chloride: 102 mmol/L (ref 98–111)
Chloride: 102 mmol/L (ref 98–111)
Creatinine, Ser: 4.33 mg/dL — ABNORMAL HIGH (ref 0.44–1.00)
Creatinine, Ser: 4.45 mg/dL — ABNORMAL HIGH (ref 0.44–1.00)
GFR calc Af Amer: 13 mL/min — ABNORMAL LOW (ref >60)
GFR calc Af Amer: 13 mL/min — ABNORMAL LOW (ref >60)
GFR calc non Af Amer: 11 mL/min — ABNORMAL LOW (ref >60)
GFR calc non Af Amer: 11 mL/min — ABNORMAL LOW (ref >60)
Glucose, Bld: 179 mg/dL — ABNORMAL HIGH (ref 70–99)
Glucose, Bld: 114 mg/dL — ABNORMAL HIGH (ref 70–99)
Potassium: 4.1 mmol/L (ref 3.5–5.1)
Potassium: 4.3 mmol/L (ref 3.5–5.1)
Sodium: 137 mmol/L (ref 135–145)
Sodium: 136 mmol/L (ref 135–145)

## 2018-05-13 LAB — BPAM RBC
Blood Product Expiration Date: 202001142359
Blood Product Expiration Date: 202001282359
Blood Product Expiration Date: 202001282359
ISSUE DATE / TIME: 202001120925
ISSUE DATE / TIME: 202001121223
ISSUE DATE / TIME: 202001121602
Unit Type and Rh: 600
Unit Type and Rh: 6200
Unit Type and Rh: 6200

## 2018-05-13 LAB — TYPE AND SCREEN
ABO/RH(D): A POS
Antibody Screen: NEGATIVE
Unit division: 0
Unit division: 0
Unit division: 0

## 2018-05-13 LAB — CBC
HCT: 26.8 % — ABNORMAL LOW (ref 36.0–46.0)
HCT: 24.8 % — ABNORMAL LOW (ref 36.0–46.0)
Hemoglobin: 8.7 g/dL — ABNORMAL LOW (ref 12.0–15.0)
Hemoglobin: 7.8 g/dL — ABNORMAL LOW (ref 12.0–15.0)
MCH: 29.4 pg (ref 26.0–34.0)
MCH: 28.7 pg (ref 26.0–34.0)
MCHC: 32.5 g/dL (ref 30.0–36.0)
MCHC: 31.5 g/dL (ref 30.0–36.0)
MCV: 90.5 fL (ref 80.0–100.0)
MCV: 91.2 fL (ref 80.0–100.0)
Platelets: 258 10*3/uL (ref 150–400)
Platelets: 222 10*3/uL (ref 150–400)
RBC: 2.96 MIL/uL — ABNORMAL LOW (ref 3.87–5.11)
RBC: 2.72 MIL/uL — ABNORMAL LOW (ref 3.87–5.11)
RDW: 15.9 % — ABNORMAL HIGH (ref 11.5–15.5)
RDW: 15.5 % (ref 11.5–15.5)
WBC: 15.6 10*3/uL — ABNORMAL HIGH (ref 4.0–10.5)
WBC: 11.7 10*3/uL — ABNORMAL HIGH (ref 4.0–10.5)
nRBC: 0.6 % — ABNORMAL HIGH (ref 0.0–0.2)
nRBC: 0.3 % — ABNORMAL HIGH (ref 0.0–0.2)

## 2018-05-13 LAB — GLUCOSE, CAPILLARY
Glucose-Capillary: 90 mg/dL (ref 70–99)
Glucose-Capillary: 96 mg/dL (ref 70–99)
Glucose-Capillary: 100 mg/dL — ABNORMAL HIGH (ref 70–99)
Glucose-Capillary: 105 mg/dL — ABNORMAL HIGH (ref 70–99)

## 2018-05-13 SURGERY — ESOPHAGOGASTRODUODENOSCOPY (EGD) WITH PROPOFOL
Anesthesia: Monitor Anesthesia Care

## 2018-05-13 MED ORDER — PHENYLEPHRINE 40 MCG/ML (10ML) SYRINGE FOR IV PUSH (FOR BLOOD PRESSURE SUPPORT)
PREFILLED_SYRINGE | INTRAVENOUS | Status: DC | PRN
  Administered 2018-05-13 (×3): 80 ug via INTRAVENOUS

## 2018-05-13 MED ORDER — LINEZOLID 600 MG/300ML IV SOLN
600.0000 mg | Freq: Two times a day (BID) | INTRAVENOUS | Status: AC
  Administered 2018-05-13 (×2): 600 mg via INTRAVENOUS
  Filled 2018-05-13 (×4): qty 300

## 2018-05-13 MED ORDER — GLYCOPYRROLATE PF 0.2 MG/ML IJ SOSY
PREFILLED_SYRINGE | INTRAMUSCULAR | Status: DC | PRN
  Administered 2018-05-13: .1 mg via INTRAVENOUS

## 2018-05-13 MED ORDER — HYDROMORPHONE HCL 1 MG/ML IJ SOLN
0.5000 mg | INTRAMUSCULAR | Status: DC | PRN
  Administered 2018-05-13: 0.5 mg via INTRAVENOUS
  Filled 2018-05-13: qty 1

## 2018-05-13 MED ORDER — PROPOFOL 10 MG/ML IV BOLUS
INTRAVENOUS | Status: DC | PRN
  Administered 2018-05-13 (×3): 20 mg via INTRAVENOUS
  Administered 2018-05-13: 30 mg via INTRAVENOUS
  Administered 2018-05-13: 80 mg via INTRAVENOUS

## 2018-05-13 MED ORDER — LIDOCAINE 2% (20 MG/ML) 5 ML SYRINGE
INTRAMUSCULAR | Status: DC | PRN
  Administered 2018-05-13: 50 mg via INTRAVENOUS

## 2018-05-13 MED ORDER — LACTATED RINGERS IV SOLN
INTRAVENOUS | Status: DC | PRN
  Administered 2018-05-13: 09:00:00 via INTRAVENOUS

## 2018-05-13 SURGICAL SUPPLY — 15 items

## 2018-05-13 NOTE — Interval H&P Note (Signed)
History and Physical Interval Note:  05/13/2018 8:42 AM  Lovinia Katherene [place]onto [place]  has presented today for surgery, with the diagnosis of GI bleed, upper  The various methods of treatment have been discussed with the patient and family. After consideration of risks, benefits and other options for treatment, the patient has consented to  Procedure(s): ESOPHAGOGASTRODUODENOSCOPY (EGD) WITH PROPOFOL (N/A) as a surgical intervention .  The patient's history has been reviewed, patient examined, no change in status, stable for surgery.  I have reviewed the patient's chart and labs.  Questions were answered to the patient's satisfaction.     [place]

## 2018-05-13 NOTE — Anesthesia Procedure Notes (Signed)
Procedure Name: MAC Date/Time: 05/13/2018 9:15 AM Performed by: Orlie Dakin, CRNA Pre-anesthesia Checklist: Patient identified, Emergency Drugs available, Suction available and Patient being monitored Patient Re-evaluated:Patient Re-evaluated prior to induction Oxygen Delivery Method: Nasal cannula Preoxygenation: Pre-oxygenation with 100% oxygen Induction Type: IV induction

## 2018-05-13 NOTE — Progress Notes (Signed)
OT Cancellation Note  Patient Details Name: Melody Gray MRN: [id number] DOB: March 23, 1971   Cancelled Treatment:    Reason Eval/Treat Not Completed: Patient at procedure or test/ unavailable(endoscopy) Will return as schedule allows. Thank you.  [place], OTR/L Acute Rehab Pager: [phone] Office: [phone] 05/13/2018, 8:23 AM

## 2018-05-13 NOTE — Evaluation (Signed)
Occupational Therapy Evaluation Patient Details Name: Melody Gray MRN: [id number] DOB: August 24, 1970 Today's Date: 05/13/2018    History of Present Illness 48 y.o. female admitted on 05/01/18 for cough, SOB, and loss of appetite.  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  Her course was complicated by PEA arrest/code on 05/03/18 had to be ventilated (difficult airway despite already having a trach in place (it was cuffless) also found to have pulseless L lower extremity.  Pt dx with compartment syndrome and underwent faciotomy on 05/03/18 (left open with wound vac), faciotomies closed on 05/06/18.  She continues to be on/off the ventilator as of 05/08/18.  Pt changed to a #4 cuffless trach on 05/10/18 and is now on her home level of O2 (2 L O2 [place]).     Clinical Impression   PTA, pt was living with her husband and was performing ADLs. Pt currently requiring Min Guard A for LB ADLs, toileting, and functional mobility with RW. Pt presenting with decreased endurance and activity tolerance requiring increased time and seated rest break during session. SpO2 >90% on 4L O2. Pt would benefit from further acute OT to facilitate safe dc. Recommend dc to home with HHOT for further OT to optimize safety, independence with ADLs, and return to PLOF.      Follow Up Recommendations  Home health OT;Supervision/Assistance - 24 hour    Equipment Recommendations  None recommended by OT    Recommendations for Other Services PT consult     Precautions / Restrictions Precautions Precautions: Fall;Other (comment)(monitor O2/vitals) Precaution Comments: very unstable trach/pseudo trach.  Pt needs to have a real trach in place before mobility.  Restrictions Weight Bearing Restrictions: No      Mobility Bed Mobility Overal bed mobility: Needs Assistance Bed Mobility: Supine to Sit     Supine to sit: Min assist;HOB elevated     General bed mobility comments: Min A for  elevating trunk  Transfers Overall transfer level: Needs assistance Equipment used: Rolling walker (2 wheeled) Transfers: Sit to/from [facility] Sit to Stand: Min guard;+2 safety/equipment;From elevated surface         General transfer comment: Min Guard A for safety. Min A for initial standing from EOB. Overall, Min Guard A    Balance Overall balance assessment: Needs assistance Sitting-balance support: No upper extremity supported;Feet supported Sitting balance-Leahy Scale: Fair     Standing balance support: Bilateral upper extremity supported;During functional activity Standing balance-Leahy Scale: Poor Standing balance comment: Reliant on UE support                           ADL either performed or assessed with clinical judgement   ADL Overall ADL's : Needs assistance/impaired Eating/Feeding: NPO   Grooming: Min guard;Wash/dry face;Oral care;Standing   Upper Body Bathing: Min guard;Sitting   Lower Body Bathing: Min guard;Sit to/from stand   Upper Body Dressing : Min guard;Sitting Upper Body Dressing Details (indicate cue type and reason): Donned second gown like a jacket     Toilet Transfer: Min guard;Ambulation;RW;Regular Toilet;Grab bars Toilet Transfer Details (indicate cue type and reason): Min Guard A for safety  Toileting- Clothing Manipulation and Hygiene: Min guard;Sitting/lateral lean       Functional mobility during ADLs: Min guard;Rolling walker General ADL Comments: Pt performing ADLs at [place] level. Pt presenting decreased endurance and activity tolerance.      Vision  Perception     Praxis      Pertinent Vitals/Pain Pain Assessment: Faces Faces Pain Scale: Hurts whole lot Pain Location: Chest Pain Descriptors / Indicators: Grimacing;Guarding Pain Intervention(s): Monitored during session;Limited activity within patient's tolerance;Repositioned     Hand Dominance Right   Extremity/Trunk  Assessment Upper Extremity Assessment Upper Extremity Assessment: Overall WFL for tasks assessed   Lower Extremity Assessment Lower Extremity Assessment: Defer to PT evaluation LLE Deficits / Details: left leg with expected post op pain and weakness.  Ankle 3-/5, knee 3-/5, hip flexion 3-/5 LLE Sensation: WNL(intact to LT)   Cervical / Trunk Assessment Cervical / Trunk Assessment: Normal   Communication Communication Communication: Tracheostomy   Cognition Arousal/Alertness: Awake/alert Behavior During Therapy: WFL for tasks assessed/performed Overall Cognitive Status: Within Functional Limits for tasks assessed                                 General Comments: Despite prolonged code, she does not show signs of anoxic BI.  She is clear, follows commands without delay and is oriented.    General Comments  VSS. SpO2 >90s on 4L    Exercises     Shoulder Instructions      Home Living Family/patient expects to be discharged to:: Private residence Living Arrangements: Children(son and daughter, but son in in school) Available Help at Discharge: Family;Available PRN/intermittently Type of Home: House Home Access: Stairs to enter [employer] of Steps: 4 Entrance Stairs-Rails: Left Home Layout: One level     Bathroom Shower/Tub: Teacher, early years/pre: Standard     Home Equipment: Bedside commode;Tub bench          Prior Functioning/Environment Level of Independence: Independent                 OT Problem List: Decreased strength;Decreased range of motion;Decreased activity tolerance;Impaired balance (sitting and/or standing);Decreased knowledge of use of DME or AE;Decreased knowledge of precautions;Pain      OT Treatment/Interventions: Self-care/ADL training;Therapeutic exercise;Energy conservation;DME and/or AE instruction;Therapeutic activities;Patient/family education    OT Goals(Current goals can be found in the care plan  section) Acute Rehab OT Goals Patient Stated Goal: "Eat foot as soon as possible" OT Goal Formulation: With patient Time For Goal Achievement: 05/27/18 Potential to Achieve Goals: Good  OT Frequency: Min 2X/week   Barriers to D/C:            Co-evaluation PT/OT/SLP Co-Evaluation/Treatment: Yes Reason for Co-Treatment: For patient/therapist safety;To address functional/ADL transfers   OT goals addressed during session: ADL's and self-care      AM-PAC OT "6 Clicks" Daily Activity     Outcome Measure Help from another person eating meals?: Total Help from another person taking care of personal grooming?: A Little Help from another person toileting, which includes using toliet, bedpan, or urinal?: A Little Help from another person bathing (including washing, rinsing, drying)?: A Little Help from another person to put on and taking off regular upper body clothing?: A Little Help from another person to put on and taking off regular lower body clothing?: A Little 6 Click Score: 16   End of Session Equipment Utilized During Treatment: Rolling walker;Gait belt;Oxygen Nurse Communication: Mobility status  Activity Tolerance: Patient tolerated treatment well Patient left: in bed;with call bell/phone within reach;with SCD's reapplied  OT Visit Diagnosis: Unsteadiness on feet (R26.81);Other abnormalities of gait and mobility (R26.89);Muscle weakness (generalized) (M62.81);Pain Pain - part of body: (Chest)  Time: 2703-5009 OT Time Calculation (min): 32 min Charges:  OT General Charges $OT Visit: 1 Visit OT Evaluation $OT Eval Moderate Complexity: [place], OTR/L Acute Rehab Pager: [phone] Office: [place] 05/13/2018, 11:41 AM

## 2018-05-13 NOTE — Progress Notes (Signed)
Inpatient Rehabilitation-Admissions Coordinator   Met with pt at the bedside as follow up from PM&R consult. Pt interested in CIR program and thinks she could benefit greatly from intensive rehab. AC will await therapy notes today to determine if functionally pt requires CIR at this time. Also noted pt is scheduled to advance diet over next day or so, so will await medical readiness if CIR is next venue of care.   Jhonnie Garner, OTR/L  Rehab Admissions Coordinator  [phone] 05/13/2018 12:08 PM

## 2018-05-13 NOTE — Progress Notes (Signed)
Physical Therapy Treatment Patient Details Name: Melody Gray MRN: [id number] DOB: 03/26/1971 Today's Date: 05/13/2018    History of Present Illness 48 y.o. female admitted on 05/01/18 for cough, SOB, and loss of appetite.  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  Her course was complicated by PEA arrest/code on 05/03/18 had to be ventilated (difficult airway despite already having a trach in place (it was cuffless) also found to have pulseless L lower extremity.  Pt dx with compartment syndrome and underwent faciotomy on 05/03/18 (left open with wound vac), faciotomies closed on 05/06/18.  She continues to be on/off the ventilator as of 05/08/18.  Pt changed to a #4 cuffless trach on 05/10/18    PT Comments    Pt pleasant on arrival stating she does not want to sit in the chair because she can't have food. In the bed she can sleep and forget about being hungry. Pt agreeable to mobility with return to bed. Pt on 4L throughout session with sPO2 100% other than brief drop to 88% with fatigue after 80' of gait. Pt with improved mobility, walking with walker and very limited assist for transfers. Will continue to follow to maximize function.     Follow Up Recommendations  Home health PT;Supervision for mobility/OOB     Equipment Recommendations  Rolling walker with 5" wheels(rollator)    Recommendations for Other Services       Precautions / Restrictions Precautions Precautions: Fall Precaution Comments: trach, PMSV Restrictions Weight Bearing Restrictions: No    Mobility  Bed Mobility Overal bed mobility: Needs Assistance Bed Mobility: Supine to Sit     Supine to sit: Min assist;HOB elevated     General bed mobility comments: Min A for elevating trunk with HHA per pt request  Transfers Overall transfer level: Needs assistance Equipment used: Rolling walker (2 wheeled) Transfers: Sit to/from Stand Sit to Stand: Min guard          General transfer comment: minguard from bed and toilet  Ambulation/Gait Ambulation/Gait assistance: Min guard Gait Distance (Feet): 80 Feet Assistive device: Rolling walker (2 wheeled) Gait Pattern/deviations: Step-through pattern;Decreased stride length;Trunk flexed   Gait velocity interpretation: 1.31 - 2.62 ft/sec, indicative of limited community ambulator General Gait Details: pt walked 15', 15', then 31' with seated rest after each trial and limited by fatigue with SpO2 dropping to 88% on 4L at last trial. Cues for position in RW   Stairs             Wheelchair Mobility    Modified Rankin (Stroke Patients Only)       Balance Overall balance assessment: Needs assistance Sitting-balance support: No upper extremity supported;Feet supported Sitting balance-Leahy Scale: Good     Standing balance support: Bilateral upper extremity supported;During functional activity Standing balance-Leahy Scale: Poor Standing balance comment: Reliant on UE support                            Cognition Arousal/Alertness: Awake/alert Behavior During Therapy: WFL for tasks assessed/performed Overall Cognitive Status: Within Functional Limits for tasks assessed                                 General Comments: Despite prolonged code, she does not show signs of anoxic BI.  She is clear, follows commands without delay and is oriented.  Exercises      General Comments General comments (skin integrity, edema, etc.): VSS. SpO2 >90s on 4L      Pertinent Vitals/Pain Pain Assessment: Faces Faces Pain Scale: Hurts even more Pain Location: Chest Pain Descriptors / Indicators: Grimacing;Guarding Pain Intervention(s): Limited activity within patient's tolerance;Premedicated before session;Monitored during session;Repositioned    Home Living Family/patient expects to be discharged to:: Private residence Living Arrangements: Children(son and daughter, but  son in in school) Available Help at Discharge: Family;Available PRN/intermittently Type of Home: House Home Access: Stairs to enter Entrance Stairs-Rails: Left Home Layout: One level Home Equipment: Bedside commode;Tub bench      Prior Function Level of Independence: Independent          PT Goals (current goals can now be found in the care plan section) Acute Rehab PT Goals Patient Stated Goal: "Eat foot as soon as possible" Time For Goal Achievement: 05/27/18 Progress towards PT goals: Goals met and updated - see care plan    Frequency           PT Plan Discharge plan needs to be updated    Co-evaluation   Reason for Co-Treatment: For patient/therapist safety;To address functional/ADL transfers   OT goals addressed during session: ADL's and self-care      AM-PAC PT "6 Clicks" Mobility   Outcome Measure  Help needed turning from your back to your side while in a flat bed without using bedrails?: A Little Help needed moving from lying on your back to sitting on the side of a flat bed without using bedrails?: A Little Help needed moving to and from a bed to a chair (including a wheelchair)?: A Little Help needed standing up from a chair using your arms (e.g., wheelchair or bedside chair)?: A Little Help needed to walk in hospital room?: A Little Help needed climbing 3-5 steps with a railing? : A Lot 6 Click Score: 17    End of Session Equipment Utilized During Treatment: Oxygen Activity Tolerance: Patient tolerated treatment well Patient left: in bed;with call bell/phone within reach(pt declined OOB to chair due to no food) Nurse Communication: Mobility status PT Visit Diagnosis: Muscle weakness (generalized) (M62.81);Difficulty in walking, not elsewhere classified (R26.2);Pain     Time: 4098-1191 PT Time Calculation (min) (ACUTE ONLY): 31 min  Charges:  $Gait Training: 8-22 mins                     [place], PT Acute Rehabilitation  Services Pager: [phone] Office: [place] 05/13/2018, 12:46 PM

## 2018-05-13 NOTE — Transfer of Care (Signed)
Immediate Anesthesia Transfer of Care Note  Patient: Melody Gray  Procedure(s) Performed: ESOPHAGOGASTRODUODENOSCOPY (EGD) WITH PROPOFOL (N/A ) BIOPSY HEMOSTASIS CLIP PLACEMENT  Patient Location: Endoscopy Unit  Anesthesia Type:MAC  Level of Consciousness: awake, alert , oriented and patient cooperative  Airway & Oxygen Therapy: Patient Spontanous Breathing and Patient connected to nasal cannula oxygen  Post-op Assessment: Report given to RN and Post -op Vital signs reviewed and stable  Post vital signs: Reviewed and stable  Last Vitals:  Vitals Value Taken Time  BP 81/31 05/13/2018  9:33 AM  Temp 36.7 C 05/13/2018  9:33 AM  Pulse 107 05/13/2018  9:33 AM  Resp 14 05/13/2018  9:33 AM  SpO2 100 % 05/13/2018  9:33 AM    Last Pain:  Vitals:   05/13/18 0933  TempSrc: Oral  PainSc: 0-No pain      Patients Stated Pain Goal: 0 (98/92/11 9417)  Complications: No apparent anesthesia complications

## 2018-05-13 NOTE — Op Note (Signed)
[facility] Patient Name: Melody Gray Procedure Date : 05/13/2018 MRN: [id number] Attending MD: Carlota Raspberry. Havery Moros , MD Date of Birth: January 23, 1971 CSN: [id number] Age: 48 Admit Type: Inpatient Procedure:                Upper GI endoscopy Indications:              Suspected upper gastrointestinal bleeding (melena,                            rise in BUN, 3 point in drop in Hgb) Providers:                Carlota Raspberry. Havery Moros, MD, Carmie End, RN,                            William Dalton, Technician Referring MD:              Medicines:                Monitored Anesthesia Care Complications:            No immediate complications. Estimated blood loss:                            Minimal. Estimated Blood Loss:     Estimated blood loss was minimal. Procedure:                Pre-Anesthesia Assessment:                           - Prior to the procedure, a History and Physical                            was performed, and patient medications and                            allergies were reviewed. The patient's tolerance of                            previous anesthesia was also reviewed. The risks                            and benefits of the procedure and the sedation                            options and risks were discussed with the patient.                            All questions were answered, and informed consent                            was obtained. Prior Anticoagulants: The patient has                            taken no previous anticoagulant or antiplatelet  agents. ASA Grade Assessment: III - A patient with                            severe systemic disease. After reviewing the risks                            and benefits, the patient was deemed in                            satisfactory condition to undergo the procedure.                           After obtaining informed consent, the endoscope was   passed under direct vision. Throughout the                            procedure, the patient's blood pressure, pulse, and                            oxygen saturations were monitored continuously. The                            GIF-H190 ([id number]) Olympus gastroscope was                            introduced through the mouth, and advanced to the                            second part of duodenum. The upper GI endoscopy was                            accomplished without difficulty. The patient                            tolerated the procedure well. Scope In: Scope Out: Findings:      Esophagogastric landmarks were identified: the Z-line was found at 38       cm, the gastroesophageal junction was found at 38 cm and the upper       extent of the gastric folds was found at 38 cm from the incisors.      Diffuse, white plaques were found in the entire esophagus consistent       with esophageal candidiasis.      The exam of the esophagus was otherwise normal.      One slowly oozing cratered gastric ulcer with pigmented material and a       focal erythematous spot at the proximal edge, was found on the lesser       curvature of the gastric body. The lesion was 4 mm in largest dimension.       For hemostasis, two hemostatic clips were successfully placed to close       the ulcer. There was no bleeding at the end of the procedure.      Patchy mildly erythematous mucosa was found in the gastric body.      The exam of the stomach was otherwise normal.      Biopsies were  taken with a cold forceps in the gastric body, at the       incisura and in the gastric antrum for Helicobacter pylori testing.      The duodenal bulb and second portion of the duodenum were normal. Impression:               - Esophagogastric landmarks identified.                           - Esophageal plaques were found, consistent with                            candidiasis.                           - Slowly oozing gastric  ulcer with pigmented                            material and red spot. 2 Clips were placed with                            good result.                           - Erythematous mucosa in the gastric body.                           - Normal duodenal bulb and second portion of the                            duodenum.                           - Biopsies were taken with a cold forceps for                            Helicobacter pylori testing. Recommendation:           - Return patient to hospital ward for ongoing care.                           - NPO for today given endoscopic therapy and recent                            significant bleeding. Liquid diet okay tomorrow and                            then advance as tolerated                           - Continue present medications including IV                            protonix 40mg  every 12 hours                           - When tolerating PO start fluconazole 100mg   once                            daily for 2 weeks to treat candidiasis (reduced                            dose given CKD)                           - Await pathology results.                           - Contact us with recurrent bleeding symptoms                           - GI service will follow Procedure Code(s):        --- Professional ---                           86767, 29, Esophagogastroduodenoscopy, flexible,                            transoral; with control of bleeding, any method                           43239, Esophagogastroduodenoscopy, flexible,                            transoral; with biopsy, single or multiple Diagnosis Code(s):        --- Professional ---                           K22.9, Disease of esophagus, unspecified                           K25.4, Chronic or unspecified gastric ulcer with                            hemorrhage CPT copyright 2018 American Medical Association. All rights reserved. The codes documented in this report are preliminary and  upon coder review may  be revised to meet current compliance requirements. Remo Lipps P. Toluwanimi Radebaugh, MD 05/13/2018 9:39:36 AM This report has been signed electronically. Number of Addenda: 0

## 2018-05-13 NOTE — Progress Notes (Signed)
PT Cancellation Note  Patient Details Name: Melody Gray MRN: [id number] DOB: August 28, 1970   Cancelled Treatment:    Reason Eval/Treat Not Completed: Patient at procedure or test/unavailable endoscopy   Annisten Manchester B Geraldyne Barraclough 05/13/2018, 8:22 AM  Elwyn Reach, PT Acute Rehabilitation Services Pager: [phone] Office: [phone]

## 2018-05-13 NOTE — Progress Notes (Signed)
Subjective: Melody Gray was seen and evaluated at bedside. No acute events overnight. No further episodes of dark emesis or melena. Denies any abdominal pain. She states her central line placement yesterday was quite painful yesterday, but is doing better today. Her right leg pain has significantly improved, as well as her gout flare.   Objective:  Vital signs in last 24 hours: Vitals:   05/12/18 2347 05/13/18 0000 05/13/18 0315 05/13/18 0400  BP:  107/66    Pulse: 82 83  81  Resp: 20  20   Temp:  97.7 F (36.5 C)  97.8 F (36.6 C)  TempSrc:  Oral  Oral  SpO2:  100%  100%  Weight:      Height:       General: awake, alert, pleasant female lying in bed in NAD CV: RRR Pulm: coarse breath sounds throughout without wheezes or crackles Ext: LLE with well healing medial and lateral incisions; non-tender; 1+ pitting edema   Assessment/Plan:  Principal Problem:   MRSA bacteremia Active Problems:   Insulin-requiring or dependent type II diabetes mellitus (HCC)   Hypertension complicating diabetes ([place])   Acute on chronic respiratory failure with hypoxemia (HCC)   Acute kidney injury (AKI) with acute tubular necrosis (ATN) (HCC)   Chronic respiratory failure with hypoxia (HCC)/ trach dep   Hyperkalemia   Chronic pulmonary hypertension (HCC)   Cardiac arrest (HCC)   Nontraumatic compartment syndrome of left lower extremity   Pneumonia   Morbid obesity (HCC)   Tobacco use disorder   Upper GI bleed   Acute blood loss anemia  Melody Gray is a 48 yo F with history of sarcoidosis with pulmonary and hepatic involvement, chronic hypoxic respiratory failure 2/2 sarcoidosis and severe OSA, difficult airway from prior traumatic intubation, CKD IV, tobacco use disorder who presented with AoC HF exacerbation and had a prolonged PEA arrest 2/2 hyperkalemia. Hospital course was complicated by multifocal PNA for which she has completed tx and MRSA bacteremia. She then developed acute blood loss anemia  2/2 UGIB in the setting of acute illness and oral steroids for gout flare.   1. Upper GI bleed:  - received 3 units pRBCs yesterday with improvement in hgb to 8.7 - EGD today showed gastric ulcer which was clipped, as well as esophageal candidiasis  - she will remain NPO for today and advance to liquids tomorrow - initiate Fluconazole once she has resumed PO  - CBC this afternoon to ensure hgb remains stable  - continue IV protonix   2. Acute on chronic hypoxic respiratory failure 2/2 acute on chronic HFpEF exacerbation: Extubated on 1/9. Now on her home 2 L of O2 [place] as well as size 4 cuffless trach and speaking valve.  Respiratory status remains stable.   3. MRSA bacteremia: Transitioned to IV Zyvox today while she remains NPO; will receive total of 2 weeks   4. AoCKD Stage IV with hyperkalemia:  - Baseline Cr 2.4-2.7.  - Renal function acutely worsen yesterday due to acute anemia which likely led to new ATN - crt somewhat improved today fluid bolus; will likely take a few days to trend back down - K is stable   5. R foot gout flare: Improving. Steroids discontinued in the setting of upper GI bleed from likely gastric ulcer.   6. Constipation: Resolved.  7. Chest wall pain: 2/2 CPR.  - pain seems to be improving - have switched to IV dilaudid q 4 while she remains NPO - transition back to  Continue oxy 10 q4h PRN tomorrow - Bowel regimen as above.   8. IDDM:  - CBGs improved with increased Levemir.   9. Renal mass: Radiology recommends MRI in 3mo or [name] if patient unable to undergo MRI.  Dispo: Anticipated discharge to CIR pending approval as well as stable blood counts.   Modena Nunnery D, DO 05/13/2018, 6:47 AM Pager: [phone]

## 2018-05-13 NOTE — Anesthesia Postprocedure Evaluation (Signed)
Anesthesia Post Note  Patient: Melody Gray  Procedure(s) Performed: ESOPHAGOGASTRODUODENOSCOPY (EGD) WITH PROPOFOL (N/A ) BIOPSY HEMOSTASIS CLIP PLACEMENT     Patient location during evaluation: PACU Anesthesia Type: MAC Level of consciousness: awake Pain management: pain level controlled Vital Signs Assessment: post-procedure vital signs reviewed and stable Respiratory status: spontaneous breathing, nonlabored ventilation and respiratory function stable Cardiovascular status: stable and blood pressure returned to baseline Postop Assessment: no apparent nausea or vomiting Anesthetic complications: no    Last Vitals:  Vitals:   05/13/18 1514 05/13/18 1600  BP: 135/72 128/67  Pulse: 78 72  Resp: 16   Temp:    SpO2:  100%    Last Pain:  Vitals:   05/13/18 1600  TempSrc:   PainSc: Asleep                 Santhiago Collingsworth P Erisa Mehlman

## 2018-05-14 ENCOUNTER — Encounter (HOSPITAL_COMMUNITY): Payer: Self-pay | Admitting: Gastroenterology

## 2018-05-14 LAB — CBC
HCT: 26 % — ABNORMAL LOW (ref 36.0–46.0)
Hemoglobin: 8.2 g/dL — ABNORMAL LOW (ref 12.0–15.0)
MCH: 29.2 pg (ref 26.0–34.0)
MCHC: 31.5 g/dL (ref 30.0–36.0)
MCV: 92.5 fL (ref 80.0–100.0)
Platelets: 234 10*3/uL (ref 150–400)
RBC: 2.81 MIL/uL — ABNORMAL LOW (ref 3.87–5.11)
RDW: 15.2 % (ref 11.5–15.5)
WBC: 11.7 10*3/uL — ABNORMAL HIGH (ref 4.0–10.5)
nRBC: 0.2 % (ref 0.0–0.2)

## 2018-05-14 LAB — BASIC METABOLIC PANEL
Anion gap: 8 (ref 5–15)
BUN: 102 mg/dL — ABNORMAL HIGH (ref 6–20)
CO2: 27 mmol/L (ref 22–32)
Calcium: 8.7 mg/dL — ABNORMAL LOW (ref 8.9–10.3)
Chloride: 104 mmol/L (ref 98–111)
Creatinine, Ser: 4.08 mg/dL — ABNORMAL HIGH (ref 0.44–1.00)
GFR calc Af Amer: 14 mL/min — ABNORMAL LOW (ref >60)
GFR calc non Af Amer: 12 mL/min — ABNORMAL LOW (ref >60)
Glucose, Bld: 136 mg/dL — ABNORMAL HIGH (ref 70–99)
Potassium: 4.3 mmol/L (ref 3.5–5.1)
Sodium: 139 mmol/L (ref 135–145)

## 2018-05-14 LAB — GLUCOSE, CAPILLARY
Glucose-Capillary: 132 mg/dL — ABNORMAL HIGH (ref 70–99)
Glucose-Capillary: 157 mg/dL — ABNORMAL HIGH (ref 70–99)
Glucose-Capillary: 158 mg/dL — ABNORMAL HIGH (ref 70–99)
Glucose-Capillary: 148 mg/dL — ABNORMAL HIGH (ref 70–99)

## 2018-05-14 LAB — HIV ANTIBODY (ROUTINE TESTING W REFLEX): HIV Screen 4th Generation wRfx: NONREACTIVE

## 2018-05-14 MED ORDER — PANTOPRAZOLE SODIUM 40 MG PO TBEC: 40.0000 mg | DELAYED_RELEASE_TABLET | Freq: Two times a day (BID) | ORAL | Status: DC

## 2018-05-14 MED ORDER — PANTOPRAZOLE SODIUM 40 MG IV SOLR
40.0000 mg | Freq: Two times a day (BID) | INTRAVENOUS | Status: DC
  Administered 2018-05-14 – 2018-05-15 (×2): 40 mg via INTRAVENOUS
  Filled 2018-05-14 (×2): qty 40

## 2018-05-14 MED ORDER — FLUCONAZOLE 100 MG PO TABS
100.0000 mg | ORAL_TABLET | Freq: Every day | ORAL | Status: DC
  Administered 2018-05-14 – 2018-05-18 (×5): 100 mg via ORAL
  Filled 2018-05-14 (×5): qty 1

## 2018-05-14 NOTE — Progress Notes (Signed)
   Subjective: Melody Gray was seen and evaluated at bedside. No acute events overnight. She complains of wall pain and painful swallowing. She is looking forward to being able to advance to a regular diet. No hematemesis or melena. Also voices concern about going home due to deconditioning.   Objective:  Vital signs in last 24 hours: Vitals:   05/14/18 0808 05/14/18 0907 05/14/18 1143 05/14/18 1153  BP: 129/64   (!) 169/74  Pulse: 87 94 99 88  Resp: 14 16 13 15   Temp: 98.2 F (36.8 C)   98.8 F (37.1 C)  TempSrc: Oral   Oral  SpO2: 100% 97% 100% 100%  Weight:      Height:       General: awake, alert, lying in bed in NAD CV: RRR Pulm: lungs CTA in anterior fields; left chest wall with TTP  Assessment/Plan:  Principal Problem:   Upper GI bleed  secondary to gastric ulcer Active Problems:   Insulin-requiring or dependent type II diabetes mellitus (HCC)   Hypertension complicating diabetes (HCC)   Acute on chronic respiratory failure with hypoxemia (HCC)   Acute kidney injury (AKI) with acute tubular necrosis (ATN) (HCC)   Chronic respiratory failure with hypoxia (HCC)/ trach dep   Hyperkalemia   Chronic pulmonary hypertension (HCC)   Cardiac arrest (HCC)   Nontraumatic compartment syndrome of left lower extremity   MRSA bacteremia   Pneumonia   Morbid obesity (HCC)   Tobacco use disorder   Acute blood loss anemia   Gastric ulcer with hemorrhage   Esophageal candidiasis ([place])  Melody Gray is a 48 yo F with history of sarcoidosis with pulmonary and hepatic involvement, chronic hypoxic respiratory failure 2/2 sarcoidosis and severe OSA, difficult airway from prior traumatic intubation, CKD IV, tobacco use disorder who presented with AoC HF exacerbation and had a prolonged PEA arrest 2/2 hyperkalemia. Hospital course was complicated by multifocal PNA for which she has completed tx and MRSA bacteremia. She then developed acute blood loss anemia 2/2 UGIB in the setting of acute illness  and oral steroids for gout flare.   1. Upper GI bleed 2/2 gastric ulcer:  - s/p clipping during EGD yesterday  - hgb stable today at 8.2 - advancing diet  - continue IV protonix   2. Esophageal candidiasis  - start Fluconazole 100 mg daily x2 weeks   3. Acute on chronic hypoxic respiratory failure 2/2 acute on chronic HFpEF exacerbation: Extubated on 1/9.Now on her home 2 L ofO2 [place] well as size4cuffless trachand speaking valve. Respiratory status remainsstable.   4. MRSA bacteremia:on Zyvox x2 weeks   5. AoCKD Stage IV with hyperkalemia: -Baseline Cr 2.4-2.7.  - likely ATN in the setting of acute blood loss - crt improving from 4.45>4.08 - K is stable   5. R foot gout flare:Resolved   6. Chest wall pain: 2/2 CPR. - pain seems to be improving - continue Oxy 10 q4 PRN have switched to IV dilaudid q 4 while she remains NPO - Bowel regimen as above.   7. IDDM: - CBGs stable on Levemir 30 units; resistant SSI   8. Renal mass: Radiology recommendsMRI in 68mo or USif patient unable to undergo MRI.  Dispo: Anticipated discharge in approximately 2-3 day(s).   Melody Gray D, DO 05/14/2018, 2:35 PM Pager: [phone]

## 2018-05-14 NOTE — Progress Notes (Addendum)
Daily Rounding Note  05/14/2018, 10:06 AM  LOS: 13 days   SUBJECTIVE:   Chief complaint: bleeding gastric ulcer  Last BM was dark, burgundy on 1/12.   Ongoing pain in left chest, predates EGD.  Overall breathing nearing baseline Hungry.  Currently on clear liquids.  OBJECTIVE:         Vital signs in last 24 hours:    Temp:  [98.4 F (36.9 C)-98.6 F (37 C)] 98.4 F (36.9 C) (01/13 2323) Pulse Rate:  [58-95] 94 (01/14 0907) Resp:  [12-19] 16 (01/14 0907) BP: (111-159)/(62-92) 159/84 (01/14 0410) SpO2:  [96 %-100 %] 97 % (01/14 0907) FiO2 (%):  [28 %-35 %] 35 % (01/14 0335) Weight:  [102.8 kg] 102.8 kg (01/14 0500) Last BM Date: 05/12/18 Filed Weights   05/11/18 0500 05/12/18 0500 05/14/18 0500  Weight: 100.2 kg 100.7 kg 102.8 kg   General: Obese, chronically ill, cushingoid appearing.  Comfortable Neck: Trach in place. Heart: RRR.  No MRG. Chest: Hoarse vocal quality.  No cough.  No dyspnea.  Lungs clear with diminished breath sounds bilaterally. Abdomen: Soft, obese, active bowel sounds.  Nontender, nondistended. Extremities: Minor, nonpitting, pedal edema. Neuro/Psych: Fully oriented x3.  Moves all 4 limbs, strength not tested.  No tremor.  Engaged, affect not depressed.  Fluid speech.  Intake/Output from previous day: 01/13 0701 - 01/14 0700 In: 300 [I.V.:300] Out: 400 [Urine:400]  Intake/Output this shift: No intake/output data recorded.  Lab Results: Recent Labs    05/13/18 0350 05/13/18 1855 05/14/18 0653  WBC 15.6* 11.7* 11.7*  HGB 8.7* 7.8* 8.2*  HCT 26.8* 24.8* 26.0*  PLT 258 222 234   BMET Recent Labs    05/12/18 2326 05/13/18 0350 05/14/18 0653  NA 137 136 139  K 4.1 4.3 4.3  CL 102 102 104  CO2 24 25 27   GLUCOSE 179* 114* 136*  BUN 116* 116* 102*  CREATININE 4.33* 4.45* 4.08*  CALCIUM 8.2* 8.6* 8.7*   LFT Recent Labs    05/12/18 0601  PROT 6.5  ALBUMIN 2.1*  AST 26    ALT 21  ALKPHOS 140*  BILITOT 0.3   PT/INR Recent Labs    05/12/18 2326  LABPROT 13.7  INR 1.05   Hepatitis Panel No results for input(s): HEPBSAG, HCVAB, HEPAIGM, HEPBIGM in the last 72 hours.  Studies/Results: Dg Chest Port 1 View  Result Date: 05/12/2018 CLINICAL DATA:  Central venous catheter. History of congestive heart failure, diabetes, hypertension, heart disease, smoker. EXAM: PORTABLE CHEST 1 VIEW COMPARISON:  05/10/2018 FINDINGS: Tracheostomy tube. Right central venous catheter with tip over the right atrium. Cardiac enlargement. No vascular congestion or edema. Infiltrates or atelectasis in both lower lungs. No pneumothorax. IMPRESSION: Right central venous catheter with tip over the right atrium. No pneumothorax. Cardiac enlargement. Infiltration or atelectasis in both lung bases. Electronically Signed   By: Lucienne Capers M.D.   On: 05/12/2018 21:21   Scheduled Meds: . fluticasone  1 spray Each Nare Daily  . insulin aspart  0-20 Units Subcutaneous TID AC & HS  . insulin aspart  4 Units Subcutaneous TID WC  . insulin detemir  30 Units Subcutaneous QHS  . lidocaine  2 patch Transdermal Q24H  . mouth rinse  15 mL Mouth Rinse BID  . pantoprazole (PROTONIX) IV  40 mg Intravenous Q12H  . sodium chloride flush  3 mL Intravenous Q12H   Continuous Infusions: . sodium chloride Stopped (05/12/18 0944)  PRN Meds:.albuterol, bisacodyl, HYDROmorphone (DILAUDID) injection, ondansetron (ZOFRAN) IV, oxyCODONE  ASSESMENT:   *   GI bleed manifesting with melenic stool in the setting of SQ Heparin (now stopped) 05/13/2018 EGD with esophageal candidiasis, losing gastric ulcer with pigmented material and red spot oozing ceased after placement of 2 endoclips.  Gastritis.  Duodenal and duodenal bulb normal.  Biopsies obtained for H. pylori testing. Day 3 Protonix 40 mg IV BID.  Took Ranitidine 150 mg BID PTA.    *    Blood loss anemia requiring 3 U PRBCs.  Hgb 5 >> 8.2.    *    Cirrhosis and splenomegaly by imaging.  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  No evidence of portal hypertensive gastropathy or varices on the EGD.  *   CHF, Bil PNA.  Vanc>> Cefepime/Zyvox >> Zyvox.    Baseline sarcoidosis, OSA, trach/oxygent dependent.    *   Prolonged PEA arrest 05/03/2018.    *   Pulseless Left LE, compartment syndrome, s/p 05/03/18 fasciotomy.    *   AKI, Stage 5 CKD.  Not oliguric.  *   Left kidney cystic lesion.   *   IDDM    PLAN   *   Awaiting biopsy path report.  Switch to Protonix 40 mg p.o. twice daily.  Question of allow/advance to diabetic diet?    Melody Gray  05/14/2018, 10:06 AM Phone [phone]   ________________________________________________________________________  Velora Heckler GI MD note:  I personally examined the patient, reviewed the data and agree with the assessment and plan described above.  No BM in 2 days, Hb  Stable with appropriate bump from 5 to 8 after 3 unit transfusion 2 days ago. BID PPI IV for now to help heal the gastric ulcer.  Will advance diet to solid food now. She'll eventually need repeat EGD to confirm ulcer healing (2 months from now) and we'll start her on appropriate antibiotics if biopsies show H. Pylori.   Owens Loffler, MD [facility] Pager [phone]

## 2018-05-14 NOTE — Progress Notes (Signed)
Inpatient Rehabilitation-Admissions Coordinator   Based on most recent therapy notes, it does not appear that pt requires CIR at this time. Pt is progressing well at [place] level with therapy now recommending home with home health. Spoke with pt regarding recommendations, with pt stating she believes she will be okay at home.   AC will sign off at this time.   Jhonnie Garner, OTR/L  Rehab Admissions Coordinator  [phone] 05/14/2018 3:16 PM

## 2018-05-15 DIAGNOSIS — K254 Chronic or unspecified gastric ulcer with hemorrhage: Secondary | ICD-10-CM

## 2018-05-15 LAB — CBC
HCT: 24.3 % — ABNORMAL LOW (ref 36.0–46.0)
Hemoglobin: 7.3 g/dL — ABNORMAL LOW (ref 12.0–15.0)
MCH: 28.4 pg (ref 26.0–34.0)
MCHC: 30 g/dL (ref 30.0–36.0)
MCV: 94.6 fL (ref 80.0–100.0)
Platelets: 197 10*3/uL (ref 150–400)
RBC: 2.57 MIL/uL — ABNORMAL LOW (ref 3.87–5.11)
RDW: 14.7 % (ref 11.5–15.5)
WBC: 12.2 10*3/uL — ABNORMAL HIGH (ref 4.0–10.5)
nRBC: 0.2 % (ref 0.0–0.2)

## 2018-05-15 LAB — BASIC METABOLIC PANEL
Anion gap: 8 (ref 5–15)
BUN: 95 mg/dL — ABNORMAL HIGH (ref 6–20)
CO2: 27 mmol/L (ref 22–32)
Calcium: 8.6 mg/dL — ABNORMAL LOW (ref 8.9–10.3)
Chloride: 103 mmol/L (ref 98–111)
Creatinine, Ser: 4.51 mg/dL — ABNORMAL HIGH (ref 0.44–1.00)
GFR calc Af Amer: 12 mL/min — ABNORMAL LOW (ref >60)
GFR calc non Af Amer: 11 mL/min — ABNORMAL LOW (ref >60)
Glucose, Bld: 162 mg/dL — ABNORMAL HIGH (ref 70–99)
Potassium: 5 mmol/L (ref 3.5–5.1)
Sodium: 138 mmol/L (ref 135–145)

## 2018-05-15 LAB — GLUCOSE, CAPILLARY
Glucose-Capillary: 147 mg/dL — ABNORMAL HIGH (ref 70–99)
Glucose-Capillary: 148 mg/dL — ABNORMAL HIGH (ref 70–99)
Glucose-Capillary: 140 mg/dL — ABNORMAL HIGH (ref 70–99)
Glucose-Capillary: 199 mg/dL — ABNORMAL HIGH (ref 70–99)

## 2018-05-15 MED ORDER — PANTOPRAZOLE SODIUM 40 MG PO TBEC
40.0000 mg | DELAYED_RELEASE_TABLET | Freq: Two times a day (BID) | ORAL | Status: DC
  Administered 2018-05-15 – 2018-05-18 (×6): 40 mg via ORAL
  Filled 2018-05-15 (×7): qty 1

## 2018-05-15 MED ORDER — LINEZOLID 600 MG PO TABS
600.0000 mg | ORAL_TABLET | Freq: Two times a day (BID) | ORAL | Status: DC
  Administered 2018-05-15 – 2018-05-16 (×4): 600 mg via ORAL
  Filled 2018-05-15 (×5): qty 1

## 2018-05-15 NOTE — Social Work (Signed)
CSW received verbal hand off that CIR will be unable to take pt and "pt needs SNF."  Will complete fl2 and send referral. Pt placement options with trach may be minimal. CSW continuing to follow for support with disposition when medically appropriate.  Westley Hummer, MSW, [place] Work [phone]

## 2018-05-15 NOTE — NC FL2 (Signed)
[place] LEVEL OF CARE SCREENING TOOL     IDENTIFICATION  Patient Name: Melody Gray Birthdate: 1971-01-21 Sex: female Admission Date (Current Location): 04/30/2018  [facility] and [place] Number:  Herbalist and Address:  [facility], [place] [address] [place], [place] [address]      Provider Number: [id number]  Attending Physician Name and Address:  Lucious Groves, DO  Relative Name and Phone Number:  Camani Sesay; sister; [phone]    Current Level of Care: Hospital Recommended Level of Care: [place] Prior Approval Number:    Date Approved/Denied:   PASRR Number:   [id number]  Discharge Plan: SNF    Current Diagnoses: Patient Active Problem List   Diagnosis Date Noted  . Esophageal candidiasis ([place]) 05/13/2018  . Gastric ulcer with hemorrhage   . Tobacco use disorder 05/12/2018  . Upper GI bleed  secondary to gastric ulcer   . Acute blood loss anemia   . Acute respiratory failure ([place])   . Respiratory failure ([place])   . Chronic kidney disease (CKD), stage IV (severe) ([place])   . Morbid obesity ([place])   . Leukocytosis   . Sinus tachycardia   . Abdominal tightness   . MRSA bacteremia 05/06/2018  . Pneumonia 05/06/2018  . Cardiac arrest ([place]) 05/03/2018  . Nontraumatic compartment syndrome of left lower extremity   . Hyperkalemia 04/30/2018  . Chronic pulmonary hypertension ([place]) 04/30/2018  . Productive cough 05/07/2017  . Gallbladder polyp 05/07/2017  . Cirrhosis ([place]) 05/07/2017  . Chronic respiratory failure with hypoxia (HCC)/ trach dep 04/06/2017  . Osteoporosis screening 12/09/2015  . OSA (obstructive sleep apnea)   . Difficult airway   . Morbid obesity due to excess calories ([place]) complicated by OHS/ hbp 43/15/4008  . Tracheostomy dependent ([place])   . Cigarette smoker   . Obesity hypoventilation syndrome ([place])   . Acute renal failure superimposed on stage 4 chronic kidney  disease ([place])   . Cardiomyopathy, nonischemic ([place])   . Hypomagnesemia 09/15/2014  . Acute kidney injury (AKI) with acute tubular necrosis (ATN) (HCC)   . Acute respiratory failure with hypoxia ([place])   . Acute on chronic respiratory failure with hypoxemia ([place]) 05/05/2014  . Long term current use of systemic steroids 05/05/2014  . Health care maintenance 02/22/2014  . Sleep apnea 12/17/2013  . Adjustment disorder with depressed mood 11/13/2013  . Tracheostomy status ([place]) 10/28/2013  . Hyperlipidemia with target LDL less than 100 09/15/2013  . Smoker 07/25/2013  . ASCUS (atypical squamous cells of undetermined significance) on Pap smear 09/03/2012  . Left foot pain 09/03/2012  . Arterial epistaxis 11/13/2011  . AKI (acute kidney injury) ([place]) 05/10/2011  . Helicobacter pylori ab+ 05/02/2011  . Pulmonary hypertension associated with sarcoidosis ([place]) 10/05/2010  . Left hip pain 08/02/2010  . Anemia in chronic renal disease 01/19/2010  . 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 Chronic diastolic heart failure, grade 2 04/28/2009  . NEPHROTIC SYNDROME 04/12/2009  . Body mass index (BMI) of 40.1-44.9 in adult ([place]) 04/06/2009  . GERD (gastroesophageal reflux disease) 06/12/2008  . Sarcoidosis ([place]) 05/22/2008  . Insulin-requiring or dependent type II diabetes mellitus ([place]) 02/10/2007  . Hypertension complicating diabetes ([place]) 04-08-202008  . History of prolonged Q-T interval on ECG 04-08-202008    Orientation RESPIRATION BLADDER Height & Weight     Self, Time, Situation, Place  Tracheostomy, O2(32mm cuffless trach; 3 liters nasal canule) Continent,  External catheter Weight: 228 lb 13.4 oz (103.8 kg) Height:  5\' 1"  (154.9 cm)  BEHAVIORAL SYMPTOMS/MOOD NEUROLOGICAL BOWEL NUTRITION STATUS      Continent Diet(see discharge summary)  AMBULATORY STATUS COMMUNICATION OF NEEDS Skin   Limited Assist Verbally Surgical wounds, Other  (Comment)(surgical incision on left leg with sutures)                       Personal Care Assistance Level of Assistance  Bathing, Feeding, Dressing Bathing Assistance: Limited assistance Feeding assistance: Independent Dressing Assistance: Limited assistance     Functional Limitations Info  Hearing, Sight, Speech Sight Info: Adequate Hearing Info: Adequate Speech Info: Adequate    SPECIAL CARE FACTORS FREQUENCY  PT (By licensed PT), OT (By licensed OT)     PT Frequency: 5x week OT Frequency: 5x week            Contractures Contractures Info: Not present    Additional Factors Info  Code Status, Allergies, Insulin Sliding Scale Code Status Info: Full Code Allergies Info: VICODIN HYDROCODONE-ACETAMINOPHEN    Insulin Sliding Scale Info: insulin aspart (novoLOG) injection 0-20 Units 3x daily before meals and bedtime; insulin aspart (novoLOG) injection 4 Units 3x daily with meals; insulin detemir (LEVEMIR) injection 30 Units daily at bedtime       Current Medications (05/15/2018):  This is the current hospital active medication list Current Facility-Administered Medications  Medication Dose Route Frequency Provider Last Rate Last Dose  . 0.45 % sodium chloride infusion   Intravenous Continuous [name], NP   Stopped at 05/12/18 [phone]  . albuterol (PROVENTIL) (2.5 MG/3ML) 0.083% nebulizer solution 2.5 mg  2.5 mg Nebulization Q2H PRN [name], NP   2.5 mg at 05/05/18 1951  . bisacodyl (DULCOLAX) suppository 10 mg  10 mg Rectal Daily PRN [name], NP   10 mg at 05/11/18 1531  . fluconazole (DIFLUCAN) tablet 100 mg  100 mg Oral Daily [place], Carley D, DO   100 mg at 05/15/18 0900  . fluticasone (FLONASE) 50 MCG/ACT nasal spray 1 spray  1 spray Each Nare Daily [name], NP   1 spray at 05/15/18 0902  . HYDROmorphone (DILAUDID) injection 0.5 mg  0.5 mg Intravenous Q4H PRN [place], Carley D, DO   0.5 mg at 05/13/18 1106  . insulin aspart  (novoLOG) injection 0-20 Units  0-20 Units Subcutaneous TID AC & HS [name], NP   3 Units at 05/15/18 1159  . insulin aspart (novoLOG) injection 4 Units  4 Units Subcutaneous TID WC Welford Roche, MD   4 Units at 05/15/18 1200  . insulin detemir (LEVEMIR) injection 30 Units  30 Units Subcutaneous QHS Welford Roche, MD   30 Units at 05/14/18 2117  . lidocaine (LIDODERM) 5 % 2 patch  2 patch Transdermal Q24H [name], NP   2 patch at 05/15/18 1159  . linezolid (ZYVOX) tablet 600 mg  600 mg Oral Q12H Susa Raring, RPH   600 mg at 05/15/18 4008  . MEDLINE mouth rinse  15 mL Mouth Rinse BID [name], NP   15 mL at 05/15/18 0903  . ondansetron (ZOFRAN) injection 4 mg  4 mg Intravenous Q6H PRN Welford Roche, MD   4 mg at 05/11/18 1517  . oxyCODONE (Oxy IR/ROXICODONE) immediate release tablet 10 mg  10 mg Oral Q4H PRN Welford Roche, MD   10 mg at 05/15/18 0918  . pantoprazole (PROTONIX) EC tablet 40 mg  40 mg Oral BID Santos-Sanchez, Merlene Morse, MD      . sodium chloride flush (NS) 0.9 % injection 3 mL  3 mL Intravenous Q12H [name], NP   3 mL at 05/15/18 7493     Discharge Medications: Please see discharge summary for a list of discharge medications.  Relevant Imaging Results:  Relevant Lab Results:   Additional Information [id number] [place], [place]

## 2018-05-15 NOTE — Consult Note (Signed)
[facility] ROUNDING NOTE   Subjective: No acute events overnight. Hgb 7.3 this AM from 8.2 and 7.8 the previous 2 days. Otherwise no overt GI blood loss.  She has not had a bowel movement in the last 2 days.  She denies abdominal pain and tolerating p.o. intake without issue.  Biopsies from EGD on 1/13 demonstrate gastritis but negative for H. pylori.  Objective: Vital signs in last 24 hours: Temp:  [97.8 F (36.6 C)-98.8 F (37.1 C)] 97.8 F (36.6 C) (01/15 0741) Pulse Rate:  [75-103] 103 (01/15 0817) Resp:  [13-20] 20 (01/15 0817) BP: (126-194)/(64-85) 131/76 (01/15 0817) SpO2:  [91 %-100 %] 100 % (01/15 0817) FiO2 (%):  [28 %-60 %] 35 % (01/15 0338) Weight:  [103.8 kg] 103.8 kg (01/15 0500) Last BM Date: 05/12/18 General: NAD Lungs: CTA bilaterally, no wheezes, rales, rhonchi.  Trach in place Heart: RRR, no murmurs, rubs, gallops Abdomen: Soft, nontender, nondistended, +bowel sounds Ext: LLE incision site c/d/i.  Otherwise no edema   Intake/Output from previous day: 01/14 0701 - 01/15 0700 In: -  Out: 1950 [Urine:1950] Intake/Output this shift: Total I/O In: 240 [P.O.:240] Out: -    Lab Results: Recent Labs    05/13/18 1855 05/14/18 0653 05/15/18 0500  WBC 11.7* 11.7* 12.2*  HGB 7.8* 8.2* 7.3*  PLT 222 234 197  MCV 91.2 92.5 94.6   BMET Recent Labs    05/13/18 0350 05/14/18 0653 05/15/18 0500  NA 136 139 138  K 4.3 4.3 5.0  CL 102 104 103  CO2 25 27 27   GLUCOSE 114* 136* 162*  BUN 116* 102* 95*  CREATININE 4.45* 4.08* 4.51*  CALCIUM 8.6* 8.7* 8.6*   LFT No results for input(s): PROT, ALBUMIN, AST, ALT, ALKPHOS, BILITOT, BILIDIR, IBILI in the last 72 hours. PT/INR Recent Labs    05/12/18 2326  INR 1.05      Imaging/Other results: No results found.    Assessment   1) Gastric Ulcer:  EGD on 05/13/2018 with esophageal candidiasis, gastric ulcer with high-grade stigmata requiring 2 endoclips with successful endoscopic  treatment.  Biopsies demonstrating gastritis but otherwise negative for H. pylori. -Okay to transition to Protonix 40 mg IV twice daily to p.o. twice daily -Resume high-dose acid suppression therapy for at least 8 weeks -Repeat EGD in 8 weeks to document appropriate ulcer healing - Hemoglobin decreased to 7.3 from 8.2 yesterday and 7.8 the day prior.  Otherwise no overt GI blood loss.  Difficult to interpret BUN given underlying CKD, but is downtrending.  No plan for repeat endoscopic evaluation at this time given lack of overt GI blood loss and hemodynamic stability in favor of continued observation. - Resume daily H/H checks for now - Continue to advance diet as tolerated  2) Acute blood loss anemia: - Transfused 3 units PRBCs with appropriate rise in hemoglobin from 5-8.2.  Now 7.3 but without overt GI blood loss as above. ?  Equilibration. 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  Otherwise no portal hypertensive gastropathy or varices noted on EGD. - Can continue to evaluate and treat as outpatient.  4) CHF, bilateral pneumonia: -Antimicrobial therapy per primary Hospitalist service -Underlying Sarcoidosis, OSA, trach/oxygen dependent  5) status post PEA arrest 05/03/2018: -Controlled pain control status post compressions per primary Hospitalist service  6) LLP compartment syndrome status post  fasciotomy on 05/03/2018      Lavena Bullion, DO  05/15/2018, 11:41 AM [facility] Pager [phone]

## 2018-05-15 NOTE — Progress Notes (Signed)
Physical Therapy Treatment Patient Details Name: Melody Gray MRN: [id number] DOB: [birth year] Today's Date: 05/15/2018    History of Present Illness 48 y.o. female admitted on 05/01/18 for cough, SOB, and loss of appetite.  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  Her course was complicated by PEA arrest/code on 05/03/18 had to be ventilated (difficult airway despite already having a trach in place (it was cuffless) also found to have pulseless L lower extremity.  Pt dx with compartment syndrome and underwent faciotomy on 05/03/18 (left open with wound vac), faciotomies closed on 05/06/18.  She continues to be on/off the ventilator as of 05/08/18.  Pt changed to a #4 cuffless trach on 05/10/18    PT Comments    Pt is now getting more independent minded and struggles to do for herself, but needs min assist in her struggles to get to EOB, and still shows fatigue enough for her gait pattern to degrade and to decrease safety.   Follow Up Recommendations  CIR(post acute care so pt is fully Independent before home)     Equipment Recommendations  Rolling walker with 5" wheels    Recommendations for Other Services       Precautions / Restrictions Precautions Precautions: Fall Precaution Comments: trach, PMSV    Mobility  Bed Mobility Overal bed mobility: Needs Assistance Bed Mobility: Supine to Sit;Sit to Supine     Supine to sit: Min assist;HOB elevated Sit to supine: Min guard   General bed mobility comments: heavy use of the rail and HOB raised  Transfers Overall transfer level: Needs assistance Equipment used: Rolling walker (2 wheeled) Transfers: Sit to/from Stand Sit to Stand: Min guard         General transfer comment: struggles from lower surface.  Relatively easy from armchair.  Ambulation/Gait Ambulation/Gait assistance: Min assist;Min guard Gait Distance (Feet): 3 Feet(85' to the bathroom and 12' to the chair) Assistive  device: Rolling walker (2 wheeled) Gait Pattern/deviations: Step-to pattern;Decreased step length - right;Decreased stance time - left;Decreased stride length Gait velocity: slower Gait velocity interpretation: 1.31 - 2.62 ft/sec, indicative of limited community ambulator General Gait Details: Pt's L LE incisions cause pain toward toe off on the L making it difficult to get swing through on the right  even up to the level of the left foot.  So gait is antalgic and a little unsteady.  There is a general degrade with distance and pain versus fatigue.   Stairs             Wheelchair Mobility    Modified Rankin (Stroke Patients Only)       Balance Overall balance assessment: Needs assistance   Sitting balance-Leahy Scale: Good       Standing balance-Leahy Scale: Poor Standing balance comment: Reliant on UE support                            Cognition Arousal/Alertness: Awake/alert Behavior During Therapy: WFL for tasks assessed/performed Overall Cognitive Status: Within Functional Limits for tasks assessed                                        Exercises      General Comments General comments (skin integrity, edema, etc.): On 3L sats varied from 90 to 94% during gait.  HR in the 90's to  110's      Pertinent Vitals/Pain Pain Assessment: Faces Faces Pain Scale: Hurts a little bit Pain Location: Chest Pain Descriptors / Indicators: Grimacing;Guarding Pain Intervention(s): Monitored during session    Home Living                      Prior Function            PT Goals (current goals can now be found in the care plan section) Acute Rehab PT Goals Patient Stated Goal: "Eat food as soon as possible" PT Goal Formulation: With patient Time For Goal Achievement: 05/27/18 Potential to Achieve Goals: Good Progress towards PT goals: Progressing toward goals    Frequency    Min 3X/week      PT Plan Discharge plan needs to  be updated    Co-evaluation              AM-PAC PT "6 Clicks" Mobility   Outcome Measure  Help needed turning from your back to your side while in a flat bed without using bedrails?: A Little Help needed moving from lying on your back to sitting on the side of a flat bed without using bedrails?: A Little Help needed moving to and from a bed to a chair (including a wheelchair)?: A Little Help needed standing up from a chair using your arms (e.g., wheelchair or bedside chair)?: A Little Help needed to walk in hospital room?: A Little Help needed climbing 3-5 steps with a railing? : A Lot 6 Click Score: 17    End of Session Equipment Utilized During Treatment: Oxygen Activity Tolerance: Patient tolerated treatment well Patient left: in chair;with call bell/phone within reach;with chair alarm set Nurse Communication: Mobility status PT Visit Diagnosis: Muscle weakness (generalized) (M62.81);Difficulty in walking, not elsewhere classified (R26.2);Pain Pain - Right/Left: Left Pain - part of body: Leg     Time: 3009-2330 PT Time Calculation (min) (ACUTE ONLY): 39 min  Charges:  $Gait Training: 8-22 mins $Therapeutic Activity: 8-22 mins $Self Care/Home Management: 8-22                     05/15/2018  Donnella Sham, [place] [phone]  (pager) [phone]  (office)   Melody Gray 05/15/2018, 5:43 PM

## 2018-05-15 NOTE — Progress Notes (Signed)
Subjective: Melody Gray was seen and evaluated at bedside.  No acute events overnight. She is feeling better since she has been able to eat more. Hoarseness also seems better today. Chest wall pain has also improved. Patient notes that she lives with her daughter who works Monday-Friday so won't have a lot of help during the day at home.    Objective:  Vital signs in last 24 hours: Vitals:   05/15/18 0741 05/15/18 0817 05/15/18 1149 05/15/18 1205  BP: 131/76 131/76 128/70 128/70  Pulse: 92 (!) 103 89 99  Resp: 14 20 12 16   Temp: 97.8 F (36.6 C)  98.1 F (36.7 C)   TempSrc: Oral  Oral   SpO2: 100% 100% 100% 100%  Weight:      Height:       General: awake, alert, sitting up in bed in NAD Pulm: saturating well on 2L [place]; normal respiratory effort  Ext: well healing fasciotomy scars to LLE without evidence of erythema   Assessment/Plan:  Principal Problem:   Upper GI bleed  secondary to gastric ulcer Active Problems:   Insulin-requiring or dependent type II diabetes mellitus (HCC)   Hypertension complicating diabetes ([place])   Acute on chronic respiratory failure with hypoxemia (HCC)   Acute kidney injury (AKI) with acute tubular necrosis (ATN) (HCC)   Chronic respiratory failure with hypoxia (HCC)/ trach dep   Hyperkalemia   Chronic pulmonary hypertension (HCC)   Cardiac arrest (HCC)   Nontraumatic compartment syndrome of left lower extremity   MRSA bacteremia   Pneumonia   Morbid obesity (HCC)   Tobacco use disorder   Acute blood loss anemia   Gastric ulcer with hemorrhage   Esophageal candidiasis ([place])  Melody Gray is a 48 yo F with history of sarcoidosis with pulmonary and hepatic involvement, chronic hypoxic respiratory failure 2/2 sarcoidosis and severe OSA, difficult airway from prior traumatic intubation, CKD IV, tobacco use disorder who presented with AoC HF exacerbation and had a prolonged PEA arrest 2/2 hyperkalemia. Hospital course was complicated by multifocal PNA for  which she has completed tx and MRSA bacteremia.She then developed acute blood loss anemia 2/2 UGIB in the setting of acute illness and oral steroids for gout flare.  1.Upper GI bleed 2/2 gastric ulcer:  - s/p EGD with clipping 05/13/18  - biopsies negative for H. Pylori   - hgb down-trended slightly from 8.2>7.3; no signs or symptoms of active bleeding and remains hemodynamically stable; will continue to monitor  - tolerating full diet   - appreciate GI following; will transition to PO Protonix BID  - will need repeat EGD in 8 weeks   2. Esophageal candidiasis  -  Continue Fluconazole 100 mg daily x2 weeks   3.Acute on chronic hypoxic respiratory failure 2/2 acute on chronic HFpEF exacerbation: Extubated on 1/9.Now on her home 2 L ofO2 NCas well as size4cuffless trachand speaking valve. Respiratory status remainsstable.   4.MRSA bacteremia:on Zyvox x2 weeks   5.AoCKD Stage IV with hyperkalemia: -Baseline Cr 2.4-2.7. -crt remains elevated at 4.5; likely ATN in the setting of acute blood loss anemia that worsened from acute injury with PEA arrest  - other electrolytes remain stable   5.R foot gout flare:Resolved   6.Chest wall pain: 2/2 CPR. - pain well controlled on Oxy 10 q 4 PRN  -continue bowel regimen   7.IDDM: - CBGs stable on Levemir 30 units; resistant SSI   8.Renal mass: Radiology recommendsMRI in 86mo or USif patient unable to undergo MRI.  Dispo: Patient seems quite deconditioned from extensive ICU and prolong hospital course. Will touch base with PT for re-evaluation.   Melody Gray D, DO 05/15/2018, 2:31 PM Pager: [phone]

## 2018-05-16 ENCOUNTER — Encounter: Payer: Self-pay | Admitting: Gastroenterology

## 2018-05-16 DIAGNOSIS — N2889 Other specified disorders of kidney and ureter: Secondary | ICD-10-CM | POA: Diagnosis not present

## 2018-05-16 DIAGNOSIS — B3781 Candidal esophagitis: Secondary | ICD-10-CM

## 2018-05-16 LAB — BASIC METABOLIC PANEL
Anion gap: 7 (ref 5–15)
Anion gap: 9 (ref 5–15)
BUN: 93 mg/dL — ABNORMAL HIGH (ref 6–20)
BUN: 89 mg/dL — ABNORMAL HIGH (ref 6–20)
CO2: 26 mmol/L (ref 22–32)
CO2: 26 mmol/L (ref 22–32)
Calcium: 8.9 mg/dL (ref 8.9–10.3)
Calcium: 8.8 mg/dL — ABNORMAL LOW (ref 8.9–10.3)
Chloride: 107 mmol/L (ref 98–111)
Chloride: 106 mmol/L (ref 98–111)
Creatinine, Ser: 4.46 mg/dL — ABNORMAL HIGH (ref 0.44–1.00)
Creatinine, Ser: 4.06 mg/dL — ABNORMAL HIGH (ref 0.44–1.00)
GFR calc Af Amer: 13 mL/min — ABNORMAL LOW (ref >60)
GFR calc Af Amer: 14 mL/min — ABNORMAL LOW (ref >60)
GFR calc non Af Amer: 11 mL/min — ABNORMAL LOW (ref >60)
GFR calc non Af Amer: 12 mL/min — ABNORMAL LOW (ref >60)
Glucose, Bld: 169 mg/dL — ABNORMAL HIGH (ref 70–99)
Glucose, Bld: 169 mg/dL — ABNORMAL HIGH (ref 70–99)
Potassium: 5.4 mmol/L — ABNORMAL HIGH (ref 3.5–5.1)
Potassium: 5.7 mmol/L — ABNORMAL HIGH (ref 3.5–5.1)
Sodium: 140 mmol/L (ref 135–145)
Sodium: 141 mmol/L (ref 135–145)

## 2018-05-16 LAB — CBC
HCT: 25.5 % — ABNORMAL LOW (ref 36.0–46.0)
Hemoglobin: 7.7 g/dL — ABNORMAL LOW (ref 12.0–15.0)
MCH: 28.3 pg (ref 26.0–34.0)
MCHC: 30.2 g/dL (ref 30.0–36.0)
MCV: 93.8 fL (ref 80.0–100.0)
Platelets: 212 10*3/uL (ref 150–400)
RBC: 2.72 MIL/uL — ABNORMAL LOW (ref 3.87–5.11)
RDW: 14.3 % (ref 11.5–15.5)
WBC: 11.5 10*3/uL — ABNORMAL HIGH (ref 4.0–10.5)
nRBC: 0.3 % — ABNORMAL HIGH (ref 0.0–0.2)

## 2018-05-16 LAB — GLUCOSE, CAPILLARY
Glucose-Capillary: 167 mg/dL — ABNORMAL HIGH (ref 70–99)
Glucose-Capillary: 146 mg/dL — ABNORMAL HIGH (ref 70–99)
Glucose-Capillary: 143 mg/dL — ABNORMAL HIGH (ref 70–99)
Glucose-Capillary: 142 mg/dL — ABNORMAL HIGH (ref 70–99)

## 2018-05-16 MED ORDER — SENNA 8.6 MG PO TABS
2.0000 | ORAL_TABLET | Freq: Every day | ORAL | Status: DC
  Administered 2018-05-17 – 2018-05-18 (×2): 17.2 mg via ORAL
  Filled 2018-05-16 (×2): qty 2

## 2018-05-16 MED ORDER — SODIUM CHLORIDE 0.45 % IV SOLN
INTRAVENOUS | Status: DC
  Administered 2018-05-16: 21:00:00 via INTRAVENOUS

## 2018-05-16 MED ORDER — SODIUM ZIRCONIUM CYCLOSILICATE 10 G PO PACK
10.0000 g | PACK | Freq: Once | ORAL | Status: AC
  Administered 2018-05-16: 10 g via ORAL
  Filled 2018-05-16: qty 1

## 2018-05-16 NOTE — Clinical Social Work Note (Signed)
Clinical Social Work Assessment  Patient Details  Name: Melody Gray MRN: [id number] Date of Birth: 23-Jan-1971  Date of referral:  05/16/18               Reason for consult:  Facility Placement, Discharge Planning                Permission sought to share information with:  Facility Sport and exercise psychologist, Family Supports Permission granted to share information::  Yes, Verbal Permission Granted  Name::     Margi Edmundson  Agency::  SNFs  Relationship::  sister  Contact Information:  [phone]  Housing/Transportation Living arrangements for the past 2 months:  Single Family Home Source of Information:  Patient Patient Interpreter Needed:  None Criminal Activity/Legal Involvement Pertinent to Current Situation/Hospitalization:  No - Comment as needed Significant Relationships:  Adult Children, Siblings, Significant Other Lives with:  Adult Children Do you feel safe going back to the place where you live?  Yes Need for family participation in patient care:  Yes (Comment)  Care giving concerns: Pt lives with adult children however they work during the day and attend college classes. Pt requiring assistance with ambulation. She has a chronic trach at baseline and knows how to care for it on her own. She has supportive sister that has offered to provide assistance as able. Pt considering SNF vs Home with Home Health.   Social Worker assessment / plan:  CSW met with pt at bedside. Pt up in chair and alert. CSW introduced self, role, and reason for visit. Pt states that she lives at home with her son and her daughter. She chronically has a trach "I take care of it myself." Pt aware that recommendations are between home health and SNF vs CIR. Pt interested in CIR but if not would like to see SNF offers. CSW did explain that there are often few facilities which have staff trained for trach care even if she is comfortable with it on her own. Pt states understanding.   CSW will f/u with  pt offers when available.  Employment status:  Disabled (Comment on whether or not currently receiving Disability) Insurance information:  Medicare, Medicaid In [place] PT Recommendations:  [place], 24 Hour Supervision Information / Referral to community resources:  [place]  Patient/Family's Response to care:  Pt amenable to speaking with CSW, she states that she is ready for the "next steps," and that "my sister has told me I don't need to worry about things that she is going to help me."  Patient/Family's Understanding of and Emotional Response to Diagnosis, Current Treatment, and Prognosis:  Pt states understanding of diagnosis, current treatments and prognosis. Pt states that she has been having some feelings of sadness today since she knows that she is not going to be able to immediately be back to her normal routine. Provided emotional support and validation that transition is hard. Pt otherwise emotionally appropriate for circumstances throughout assessment.   Emotional Assessment Appearance:  Appears stated age Attitude/Demeanor/Rapport:  Engaged, Gracious, Self-Confident Affect (typically observed):  Accepting, Adaptable, Appropriate, Pleasant Orientation:  Oriented to Self, Oriented to Place, Oriented to  Time, Oriented to Situation Alcohol / Substance use:  Not Applicable Psych involvement (Current and /or in the community):  No (Comment)  Discharge Needs  Concerns to be addressed:  Care Coordination Readmission within the last 30 days:  No Current discharge risk:  Physical Impairment Barriers to Discharge:  Continued Medical Work up   [employer]  Melody Gray, LCSWA 05/16/2018, 1:46 PM

## 2018-05-16 NOTE — Progress Notes (Signed)
Inpatient Rehabilitation-Admissions Coordinator   Per PM&R MD, the pt is too high level for CIR and we are not able to accept her to our program. [facility] has communicated with Dr. Koleen Distance regarding recommendations as well as with SW regarding disposition needs.   AC will sign off.   Please call if questions.   Jhonnie Garner, OTR/L  Rehab Admissions Coordinator  [phone] 05/16/2018 12:29 PM

## 2018-05-16 NOTE — Progress Notes (Signed)
Upon RT entering room, pt sleeping and spo2 noted to be 85% with 5L [place] and 5L/28% ATC on at same time. Pt Melody Gray Valve removed and immediately spo2 improved to 93%. RN made aware. Pt reminded to take PM valve off for sleeping. No distress noted, no increased WOB. VS within normal limts after removal of PM.

## 2018-05-16 NOTE — Progress Notes (Addendum)
[facility] Progress Note  Chief Complaint: Bleeding gastric ulcer  History:  She denies abdominal pain, melena or hematemesis.  Hemoglobin remained stable from yesterday.  Patient is eating a full breakfast today without difficulty.  ROS: Cardiovascular:  no chest pain Respiratory: Chronic productive cough and dyspnea.  Objective:  Med list reviewed  Vital signs in last 24 hrs: Vitals:   05/16/18 0358 05/16/18 0403  BP: (!) 146/74 (!) 146/74  Pulse: (!) 102 (!) 104  Resp: 16 16  Temp: 98.2 F (36.8 C)   SpO2: 94% 94%    Physical Exam   HEENT: sclera anicteric, oral mucosa without lesions.  Tracheostomy in place  Neck: supple, no thyromegaly, JVD or lymphadenopathy  Cardiac: RRR without murmurs, S1S2 heard, + mild peripheral edema.   Pulm: Rhonchi bilaterally, normal RR and effort noted  Abdomen: soft, no tenderness, with active bowel sounds. No guarding or palpable hepatosplenomegaly, limited by BMI  Skin; warm and dry, no jaundice  Recent Labs:  Recent Labs  Lab 05/14/18 0653 05/15/18 0500 05/16/18 0500  WBC 11.7* 12.2* 11.5*  HGB 8.2* 7.3* 7.7*  HCT 26.0* 24.3* 25.5*  PLT 234 197 212   Recent Labs  Lab 05/12/18 0601  05/16/18 0500  NA 133*   < > 140  K 5.2*   < > 5.4*  CL 97*   < > 107  CO2 23   < > 26  BUN 126*   < > 93*  ALBUMIN 2.1*  --   --   ALKPHOS 140*  --   --   ALT 21  --   --   AST 26  --   --   GLUCOSE 258*   < > 169*   < > = values in this interval not displayed.   Recent Labs  Lab 05/12/18 2326  INR 1.05   Gastric biopsy negative for H. pylori.  Patient aware.   @ASSESSMENTPLANBEGIN @ Assessment: Bleeding gastric ulcer, most likely from NSAID use.  Patient reports she was taking these medicines for foot pain.  She was advised to stop them entirely explore other options for chronic pain control.  Anemia of acute blood loss on top of anemia of chronic kidney disease.  Stable now, bleeding has stopped after endoscopic  intervention 3 days ago.  Esophageal candidiasis, complete treatment course as outlined.  Anemia of chronic kidney disease  Plan: Twice daily PPI for 8 weeks Regular diet We will arrange follow-up in clinic after discharge, at which time repeat upper endoscopy will be arranged for about 8 weeks from now. Monitor hemoglobin and provide IV iron or transfusion of PRBCs as needed.  GI signing off, call as needed.  Total time 25 minutes, over half spent reviewing chart and discussion with patient. Nelida Meuse III Office: [phone]

## 2018-05-16 NOTE — Social Work (Signed)
Provided pt with three offers: [facility], [facility], and [facility]. She understands that these have offered placement. She prefers to discuss these with her family. Pt aware that if she does not d/c to SNF that we need to [facility] she has assistance recommended from family members and feels safe with d/c home. Pt requesting to speak with RNCM regarding home health. Before CSW left room pt stated "I don't know if they're going to let me go to a Nursing Home." Continuing to follow, this information was relayed to [facility].  Westley Hummer, MSW, [place] Work [phone]

## 2018-05-16 NOTE — Discharge Summary (Signed)
Name: Melody Gray MRN: [id number] DOB: 07/16/1970 48 y.o. 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   atorvastatin 40 MG tablet Commonly known as:  LIPITOR Take 1 tablet (40 mg total) by mouth daily.   BD INSULIN SYRINGE U/F 30G X 1/2" 0.5 ML Misc Generic drug:  Insulin Syringe-Needle U-100 USE AS DIRECTED   carvedilol 6.25 MG tablet Commonly known as:  COREG Take 1 tablet (6.25 mg total) by mouth 2 (two) times daily.   fluconazole 100 MG tablet Commonly known as:  DIFLUCAN Take 1 tablet (100 mg total) by mouth daily for 10 days.   glipiZIDE 5 MG tablet Commonly known as:  GLUCOTROL Take 0.5 tablets (2.5 mg total) by mouth daily before breakfast.   glucose blood test strip Use to check blood sugar 3 times daily. Dx code E11.65. Insulin dependent   insulin detemir 100 UNIT/ML injection Commonly known as:  LEVEMIR ADMINISTER 50 UNITS UNDER THE SKIN AT BEDTIME   Insulin Pen Needle 31G X 5 MM Misc Commonly known as:  B-D UF III MINI PEN NEEDLES USE TWICE A DAY   liraglutide 18 MG/3ML Sopn Commonly known as:   VICTOZA Inject 0.3 mLs (1.8 mg total) into the skin daily.   pantoprazole 40 MG tablet Commonly known as:  PROTONIX Take 1 tablet (40 mg total) by mouth 2 (two) times daily.   patiromer 8.4 g packet Commonly known as:  VELTASSA Take 1 packet (8.4 g total) by mouth daily for 30 days.   ROBITUSSIN COLD & COUGH PO Take 15 mLs by mouth daily as needed (cough).       Disposition and follow-up:   MelodyItaly Katherene Ponto Gray was discharged from [facility] in Stable condition.  At the hospital follow up visit please address:  1.  AKI on CKD III with hyperkalemia: Patient's renal function stabilized to 4.1 day of discharge (1/18) after acute injuries from prolonged cardiac arrest and subsequent acute blood loss anemia. Please monitor renal function closely. She was started on Valtessa for hyperkalemia (5.7 at discharge) which can be titrated weekly as needed.   UGIB 2/2 gastric ulcer: Required 3 units pRBCs. s/p EGD with clipping 1/13. Biopsies were negative for H. Pylori. She will be on Protonix BID for 8 weeks at which time she will have follow-up with GI for repeat EGD. Hgb stable at 7.9 on discharge.   Please ensure patient has gotten home health services including RN, PT, OT.   2.  Labs / imaging needed at time of follow-up: BMP, CBC, CXR in 6 weeks   3.  Pending labs/ test needing follow-up: none  Follow-up Appointments: Follow-up  Information    [name], MD Follow up in 2 week(s).   Specialties:  Vascular Surgery, Cardiology Contact information: [place] [address] [phone]        [name], MD Follow up on 06/19/2018.   Specialty:  Gastroenterology Why:  1:30 PM.  follow up of ulcer with GI doctor.  bring all your meds to the office visit.   Contact information: [address] [place] [address] [phone]        [facility] Follow up.   Specialty:  Home Health Services Why:   Nurse, physical therapist and occupational therapist to follow up with you at home. Contact information: [address] [place] [address] [place] [facility] Course by problem list:   Acute on chronic respiratory failure with hypoxemia (HCC)   Sarcoidosis (HCC)   Chronic pulmonary hypertension ([place])   Tracheostomy dependent ([place])   HFpEF with RV failure    LLL Pneumonia - Patient presented with dyspnea and cough. She was treated for LLL pneumonia with course of Cefepime x7 days. Her respiratory status had returned to baseline by time of discharge with cuffless trach at night and 2L O2 supplementation during the day. She was also in acute HF exacerbation and was diuresed with Lasix 80 mg BID. When her volume status returned to baseline, Lasix were discontinued and she was able to maintain optimal volume status without diuretics.   Will need repeat CXR in 6 weeks to ensure resolution of pneumonia.     Cardiac arrest (HCC)   Hyperkalemia  Acute kidney injury (AKI) on CKD III  Nontraumatic compartment syndrome of left lower extremity On initial presentation Melody Gray was found to have an AKI on CKD III with hyperkalemia which was being medically managed. On 1/3 she went into PEA arrest secondary to hyperkalemia with a prolonged code of 40-45 before ROSC was achieved. During the code she had an IO line placed and subsequently developed LLE compartment syndrome and underwent bilateral fasciotomy. Her lower extremity healed well, and she progressed with PT who recommended home health services at discharge. She will need to follow-up with vascular surgery outpatient.  In regards to her kidney function, it worsened due to cardiac arrest, as well as acute anemia secondary to UGIB several days later. She was followed by nephrology during hospital course and never required dialysis. Her hyperkalemia had resolved but increased again after worsening renal function in the setting of  ATN from acute blood loss anemia. She was started on Veltassa with K stable at 5.7 on discharge. This can be titrated on a weekly basis as needed. Her creatinine stabilized between 4-4.5 the last several days leading up to discharge. This is not at her baseline, but will likely recover in the next few weeks. She will nonetheless need close outpatient monitoring with labs and follow-up with nephrology.  Gastric ulcer with hemorrhage Acute blood loss anemia Esophageal candidiasis (HCC) Developed post ICU stay; likely in the setting of steroids she received for gout flare and ICU stress. Noted to have hematemesis and melena with acute blood loss anemia. Underwent EGD which revealed gastric ulcer which was H. Pylori negative. Underwent clipping and is to remain on Protonix BID x8 weeks. Will need repeat EGD at this time to ensure resolution. Hgb at discharge 7.9.  Esophageal candidiasis was also noted on EGD, and she was started on Fluconazole for 2 weeks.  MRSA bacteremia - completed a 14 day course of Zyvox per ID recommendations.   Left renal mass- 3.1cm  - Recommend MRI or U/S in 6 months if patient unable to undergo MRI   Discharge Vitals:   BP 140/73 (BP Location: Right Arm)   Pulse (!) 117   Temp 98.4 F (36.9 C) (Oral)   Resp 19   Ht 5\' 1"  (1.549 m)   Wt 99.1 kg   SpO2 100%   BMI 41.28 kg/m   Pertinent Labs, Studies, and Procedures:  CBC Latest Ref Rng & Units 05/17/2018 05/16/2018 05/15/2018  WBC 4.0 - 10.5 K/uL 12.3(H) 11.5(H) 12.2(H)  Hemoglobin 12.0 - 15.0 g/dL 7.9(L) 7.7(L) 7.3(L)  Hematocrit 36.0 - 46.0 % 26.5(L) 25.5(L) 24.3(L)  Platelets 150 - 400 K/uL 214 212 197   BMP Latest Ref Rng & Units 05/18/2018 05/18/2018 05/17/2018  Glucose 70 - 99 mg/dL 130(H) 142(H) 120(H)  BUN 6 - 20 mg/dL 71(H) 76(H) 81(H)  Creatinine 0.44 - 1.00 mg/dL 3.93(H) 4.05(H) 4.23(H)  BUN/Creat Ratio 9 - 23 - - -  Sodium 135 - 145 mmol/L 143 140 141  Potassium 3.5 - 5.1 mmol/L 5.2(H) 5.7(H) 5.7(H)    Chloride 98 - 111 mmol/L 107 106 107  CO2 22 - 32 mmol/L 27 24 26   Calcium 8.9 - 10.3 mg/dL 9.3 9.0 8.9      Discharge Instructions: Discharge Instructions    Call MD for:  difficulty breathing, headache or visual disturbances   Complete by:  As directed    Call MD for:  extreme fatigue   Complete by:  As directed    Call MD for:  persistant dizziness or light-headedness   Complete by:  As directed    Call MD for:  persistant nausea and vomiting   Complete by:  As directed    Call MD for:  severe uncontrolled pain   Complete by:  As directed    Call MD for:  temperature >100.4   Complete by:  As directed    Diet - low sodium heart healthy   Complete by:  As directed    Discharge instructions   Complete by:  As directed    Ms. Paulette, it was a pleasure taking care of you. I am glad you are feeling better.  I have sent a message to our clinic to schedule you for an appointment early next week to ensure you are doing well and to monitor your kidney function and potassium level. We have started you on a medication that will help you keep your potassium at a normal level by excreting it in your bowel movements. Please be sure to also follow up with your nephrologist.  You will continue the Protonix twice daily for your gastric ulcer until you see GI for follow-up in 8 weeks.  You will continue taking the fluconazole you are on for the fungal infection in your esophagus for another 10 days.  I have placed orders for your home health services which included physical and occupational therapy, as well as a nurse to come and check in on you.  Please feel free to call the clinic with any additional questions or concerns.   Take care! Dr. Koleen Distance   Increase activity slowly   Complete by:  As directed       Signed: Delice Bison, DO 05/20/2018, 6:03 PM   Pager: [phone]

## 2018-05-16 NOTE — Progress Notes (Signed)
Subjective: Melody Gray was seen and evaluated at bedside. No acute events overnight. She was quite drowsy when I saw her this morning, as she had not slept well the night before. Her pain is well controlled. No abdominal pain, n/v, or melena. She remains apprehensive about caring for herself at home.   Objective:  Vital signs in last 24 hours: Vitals:   05/16/18 0840 05/16/18 0900 05/16/18 1000 05/16/18 1115  BP: (!) 160/96     Pulse: (!) 113 (!) 107 (!) 104 (!) 111  Resp:  (!) 26 17 (!) 22  Temp: 98.8 F (37.1 C)     TempSrc: Oral     SpO2: 94% (!) 89% 95% (S) 91%  Weight:      Height:       General: awake, alert, sitting up in bed in NAD CV: RRR Pulm: normal respiratory effort; normal breath sounds  Ext: well healing fasciotomy scars on LLE; 1+ pitting edema on LLE; trace edema on RLE   Assessment/Plan:  Principal Problem:   Upper GI bleed  secondary to gastric ulcer Active Problems:   Insulin-requiring or dependent type II diabetes mellitus (HCC)   Hypertension complicating diabetes ([place])   Acute on chronic respiratory failure with hypoxemia (HCC)   Acute kidney injury (AKI) with acute tubular necrosis (ATN) (HCC)   Chronic respiratory failure with hypoxia (HCC)/ trach dep   Hyperkalemia   Chronic pulmonary hypertension (HCC)   Cardiac arrest (HCC)   Nontraumatic compartment syndrome of left lower extremity   MRSA bacteremia   Pneumonia   Morbid obesity (HCC)   Tobacco use disorder   Acute blood loss anemia   Gastric ulcer with hemorrhage   Esophageal candidiasis ([place])  Ms. Wachtel is a 48 yo F with history of sarcoidosis with pulmonary and hepatic involvement, chronic hypoxic respiratory failure 2/2 sarcoidosis and severe OSA, difficult airway from prior traumatic intubation, CKD IV, tobacco use disorder who presented with AoC HF exacerbation and had a prolonged PEA arrest 2/2 hyperkalemia. Hospital course was complicated by multifocal PNA for which she has completed tx  and MRSA bacteremia.She then developed acute blood loss anemia 2/2 UGIB in the setting of acute illness and oral steroids for gout flare.  1.Upper GI bleed2/2 gastric ulcer:  -s/p EGD with clipping 05/13/18  - biopsies negative for H. Pylori   - hgb stable at 7.7;  no signs or symptoms of active bleeding and remains hemodynamically stable -tolerating full diet   - appreciate GI following; she will continue BID Protonix for 8 weeks and will follow-up with GI outpatient for repeat EGD in 8 weeks   2. Esophageal candidiasis  -  Continue Fluconazole 100 mg daily x2 weeks  3.Acute on chronic hypoxic respiratory failure 2/2 acute on chronic HFpEF exacerbation: Extubated on 1/9.Now on her home 2 L ofO2 NCas well as size4cuffless trachand speaking valve. Respiratory status remainsstable.   4.MRSA bacteremia:completing final day of Zyvox today.   5.AoCKD Stage IV with hyperkalemia: -Baseline Cr 2.4-2.7. -crt remains elevated at 4.5; likely ATN in the setting of acute blood loss anemia that worsened from acute injury with PEA arrest  - potassium slightly elevated at 5.4; will repeat BMP this afternoon to ensure it is stable   5.R foot gout flare:Resolved  6.Chest wall pain: 2/2 CPR. - pain well controlled on Oxy 10 q 4 PRN  -resumed bowel regimen since she has not had BM in a few days  7.IDDM: - CBGsstable on Levemir 30 units; resistant  SSI  8.Renal mass: Radiology recommendsMRI in 27mo or USif patient unable to undergo MRI.  Dispo: Physical therapy eval yesterday agreed she is a good candidate for CIR to gain full independence prior to discharging home. Will touch base with coordinator today.   Modena Nunnery D, DO 05/16/2018, 11:37 AM Pager: [phone]

## 2018-05-16 NOTE — Progress Notes (Signed)
Occupational Therapy Treatment Patient Details Name: Melody Gray MRN: [id number] DOB: November 28, 1970 Today's Date: 05/16/2018    History of present illness 48 y.o. female admitted on 05/01/18 for cough, SOB, and loss of appetite.  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  Her course was complicated by PEA arrest/code on 05/03/18 had to be ventilated (difficult airway despite already having a trach in place (it was cuffless) also found to have pulseless L lower extremity.  Pt dx with compartment syndrome and underwent faciotomy on 05/03/18 (left open with wound vac), faciotomies closed on 05/06/18.  She continues to be on/off the ventilator as of 05/08/18.  Pt changed to a #4 cuffless trach on 05/10/18   OT comments  Pt motivated to sit upright in chair for lunch this session. Pt very dependent on purewick at this time and prefers it instead of using bedside commode. Question need to have purewick schedule for night time use and naps only. Pt currently can verbalize the need to Pat[place] prior to voiding.    Follow Up Recommendations  Home health OT;Supervision/Assistance - 24 hour    Equipment Recommendations  None recommended by OT    Recommendations for Other Services      Precautions / Restrictions Precautions Precautions: Fall Precaution Comments: trach, PMSV       Mobility Bed Mobility Overal bed mobility: Needs Assistance Bed Mobility: Rolling;Supine to Sit Rolling: Supervision   Supine to sit: Supervision     General bed mobility comments: pt requires use of bed rail to complete bed transfer with hob 30 degrees  Transfers Overall transfer level: Needs assistance Equipment used: Rolling walker (2 wheeled) Transfers: Sit to/from Stand Sit to Stand: Min guard         General transfer comment: pt able to progress to chair min guard (A) pt able to static stand for ~ 45 seconds with RW    Balance                                            ADL either performed or assessed with clinical judgement   ADL Overall ADL's : Needs assistance/impaired Eating/Feeding: Independent   Grooming: Min guard;Sitting   Upper Body Bathing: Min guard;Sitting   Lower Body Bathing: Moderate assistance       Lower Body Dressing: Moderate assistance Lower Body Dressing Details (indicate cue type and reason): pt was able to initiate figure 4 with R LE. pt requires socks done and able to pull up R one. pt unable to assist with L sock       Toileting - Clothing Manipulation Details (indicate cue type and reason): pt currently using purewick and declines bedside commode. pt reports "i dont want to " when asked in more depth explains urgency and pain is her main reason for always using purewick. pt voiding this session with purewick leaking so total (A) peri care provided and clean linen       General ADL Comments: pt agreeable to lunch in chair and initiating at the end of session.      Vision       Perception     Praxis      Cognition Arousal/Alertness: Awake/alert Behavior During Therapy: WFL for tasks assessed/performed Overall Cognitive Status: Within Functional Limits for tasks assessed  General Comments: pt apologized during session for "if I sounded rude" pt very abrupt in her request.         Exercises     Shoulder Instructions       General Comments [place] and trach oxygen on patient at this time with PMV    Pertinent Vitals/ Pain       Pain Assessment: No/denies pain  Home Living                                          Prior Functioning/Environment              Frequency  Min 2X/week        Progress Toward Goals  OT Goals(current goals can now be found in the care plan section)  Progress towards OT goals: Progressing toward goals  Acute Rehab OT Goals Patient Stated Goal: to sit up for lunch OT Goal Formulation:  With patient Time For Goal Achievement: 05/27/18 Potential to Achieve Goals: Good ADL Goals Pt Will Perform Grooming: with modified independence;standing Pt Will Perform Lower Body Dressing: with modified independence;sit to/from stand Pt Will Transfer to Toilet: with modified independence;ambulating;regular height toilet Pt Will Perform Toileting - Clothing Manipulation and hygiene: with modified independence;sit to/from stand  Plan Discharge plan remains appropriate    Co-evaluation                 AM-PAC OT "6 Clicks" Daily Activity     Outcome Measure   Help from another person eating meals?: A Little Help from another person taking care of personal grooming?: A Little Help from another person toileting, which includes using toliet, bedpan, or urinal?: A Little Help from another person bathing (including washing, rinsing, drying)?: A Little Help from another person to put on and taking off regular upper body clothing?: A Little Help from another person to put on and taking off regular lower body clothing?: A Lot 6 Click Score: 17    End of Session Equipment Utilized During Treatment: Rolling walker  OT Visit Diagnosis: Unsteadiness on feet (R26.81);Other abnormalities of gait and mobility (R26.89);Muscle weakness (generalized) (M62.81);Pain   Activity Tolerance Patient tolerated treatment well   Patient Left in chair;with call bell/phone within reach   Nurse Communication Mobility status;Precautions        Time: 1127(1127)-1143 OT Time Calculation (min): 16 min  Charges: OT General Charges $OT Visit: 1 Visit OT Treatments $Self Care/Home Management : 8-22 mins   Jeri Modena, OTR/L  Acute Rehabilitation Services Pager: [phone] Office: [phone] .    Jeri Modena 05/16/2018, 12:31 PM

## 2018-05-16 NOTE — Care Management Note (Signed)
Case Management Note  Patient Details  Name: [name] MRN: [id number] Date of Birth: Aug 01, 1970  Subjective/Objective:                  48 y.o. female admitted on 05/01/18 for cough, SOB, and loss of appetite.  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  Her course was complicated by PEA arrest/code on 05/03/18 had to be ventilated; also found to have pulseless L lower extremity.  Pt dx with compartment syndrome and underwent faciotomy on 05/03/18.  PTA, pt resided at home with daughter, who works.    Action/Plan: Pt considering possible SNF at discharge, as pt too high level for CIR.  Pt states she will not have 24h supervision at home, as daughter and sister both work.  She is considering possible home dc, but needs to think on it.  CMS list of [facility] providers given to pt for review.  Will follow up with pt on her decision.  Await medical stability for dc.   Expected Discharge Date:  (unknown)               Expected Discharge Plan:  [place]  In-House Referral:     Discharge planning Services  CM Consult  Post Acute Care Choice:  Durable Medical Equipment([employer]-home 02,trach collar) Choice offered to:     DME Arranged:    DME Agency:     HH Arranged:    HH Agency:     Status of Service:  In process, will continue to follow  If discussed at Long Length of Stay Meetings, dates discussed:    Additional Comments:  Reinaldo Raddle, RN, BSN  Trauma/Neuro ICU Case Manager [phone]

## 2018-05-16 NOTE — Progress Notes (Signed)
Inpatient Rehabilitation-Admissions Coordinator   [facility] spoke with Dr. Koleen Distance regarding CIR potential. We have been asked to reconsider pt for CIR. AC will follow and discuss case with PM&R physician today for possible admit.   AC has placed an IP Rehab Consult Order back in the chart as means to follow patient.   Please call if questions.   Jhonnie Garner, OTR/L  Rehab Admissions Coordinator  [phone] 05/16/2018 11:54 AM

## 2018-05-17 LAB — CBC
HCT: 26.5 % — ABNORMAL LOW (ref 36.0–46.0)
Hemoglobin: 7.9 g/dL — ABNORMAL LOW (ref 12.0–15.0)
MCH: 28.9 pg (ref 26.0–34.0)
MCHC: 29.8 g/dL — ABNORMAL LOW (ref 30.0–36.0)
MCV: 97.1 fL (ref 80.0–100.0)
Platelets: 214 10*3/uL (ref 150–400)
RBC: 2.73 MIL/uL — ABNORMAL LOW (ref 3.87–5.11)
RDW: 14.4 % (ref 11.5–15.5)
WBC: 12.3 10*3/uL — ABNORMAL HIGH (ref 4.0–10.5)
nRBC: 0.2 % (ref 0.0–0.2)

## 2018-05-17 LAB — GLUCOSE, CAPILLARY
Glucose-Capillary: 136 mg/dL — ABNORMAL HIGH (ref 70–99)
Glucose-Capillary: 184 mg/dL — ABNORMAL HIGH (ref 70–99)
Glucose-Capillary: 95 mg/dL (ref 70–99)
Glucose-Capillary: 111 mg/dL — ABNORMAL HIGH (ref 70–99)

## 2018-05-17 LAB — BASIC METABOLIC PANEL
Anion gap: 8 (ref 5–15)
Anion gap: 8 (ref 5–15)
BUN: 81 mg/dL — ABNORMAL HIGH (ref 6–20)
BUN: 81 mg/dL — ABNORMAL HIGH (ref 6–20)
CO2: 25 mmol/L (ref 22–32)
CO2: 26 mmol/L (ref 22–32)
Calcium: 9 mg/dL (ref 8.9–10.3)
Calcium: 8.9 mg/dL (ref 8.9–10.3)
Chloride: 106 mmol/L (ref 98–111)
Chloride: 107 mmol/L (ref 98–111)
Creatinine, Ser: 4.43 mg/dL — ABNORMAL HIGH (ref 0.44–1.00)
Creatinine, Ser: 4.23 mg/dL — ABNORMAL HIGH (ref 0.44–1.00)
GFR calc Af Amer: 13 mL/min — ABNORMAL LOW (ref >60)
GFR calc Af Amer: 13 mL/min — ABNORMAL LOW (ref >60)
GFR calc non Af Amer: 11 mL/min — ABNORMAL LOW (ref >60)
GFR calc non Af Amer: 12 mL/min — ABNORMAL LOW (ref >60)
Glucose, Bld: 161 mg/dL — ABNORMAL HIGH (ref 70–99)
Glucose, Bld: 120 mg/dL — ABNORMAL HIGH (ref 70–99)
Potassium: 5.5 mmol/L — ABNORMAL HIGH (ref 3.5–5.1)
Potassium: 5.7 mmol/L — ABNORMAL HIGH (ref 3.5–5.1)
Sodium: 139 mmol/L (ref 135–145)
Sodium: 141 mmol/L (ref 135–145)

## 2018-05-17 MED ORDER — SODIUM ZIRCONIUM CYCLOSILICATE 10 G PO PACK
10.0000 g | PACK | Freq: Every day | ORAL | Status: DC
  Administered 2018-05-17: 10 g via ORAL
  Filled 2018-05-17: qty 1

## 2018-05-17 MED ORDER — PATIROMER SORBITEX CALCIUM 8.4 G PO PACK
8.4000 g | PACK | Freq: Every day | ORAL | Status: DC
  Administered 2018-05-17: 8.4 g via ORAL
  Filled 2018-05-17 (×2): qty 1

## 2018-05-17 MED ORDER — OXYCODONE HCL 5 MG PO TABS
5.0000 mg | ORAL_TABLET | Freq: Four times a day (QID) | ORAL | Status: DC | PRN
  Administered 2018-05-18 (×2): 5 mg via ORAL
  Filled 2018-05-17 (×2): qty 1

## 2018-05-17 NOTE — Progress Notes (Signed)
Nutrition Follow-up  DOCUMENTATION CODES:   Morbid obesity  INTERVENTION:  Continue carbohydrate modified diet.   Encourage adequate PO intake.  NUTRITION DIAGNOSIS:   Inadequate oral intake related to inability to eat as evidenced by NPO status; diet advanced; improved  GOAL:   Patient will meet greater than or equal to 90% of their needs; met  MONITOR:   Vent status, Skin, Weight trends, I & O's, Labs  REASON FOR ASSESSMENT:   Consult Enteral/tube feeding initiation and management  ASSESSMENT:   48 y.o., female with chronic trach, cirrhosis, CHF admitted on 04/30/2018 for acute renal failure, suffered cardiac arrest, PEA, with ROSC after ~40 mins, neuro intact after arrest, left LLE IO line, acute LLE compartment syndrome taken for fasciotomy.   1/3: four compartment fasciotomy, left lower leg 1/6  closure LLE 1/9- ETT removed, trach replaced with baseline trach (size 4 cuffless) 1/10- off vent  Meal completion has been 75-100%. Pt reports having a good appetite with no other difficulties. Pt encouraged to eat her food at meals. Labs and medications reviewed. Potassium elevated at 5.5.   Diet Order:   Diet Order            Diet Carb Modified Fluid consistency: Thin; Room service appropriate? Yes  Diet effective now              EDUCATION NEEDS:   Not appropriate for education at this time  Skin:  Skin Assessment: Skin Integrity Issues: Skin Integrity Issues:: Incisions Incisions: L leg  Last BM:  1/15  Height:   Ht Readings from Last 1 Encounters:  05/05/18 _0  (1.549 m)    Weight:   Wt Readings from Last 1 Encounters:  05/16/18 99.1 kg    Ideal Body Weight:  47.7 kg  BMI:  Body mass index is 41.28 kg/m.  Estimated Nutritional Needs:   Kcal:  1500-1700 kcal   Protein:  75-90 grams  Fluid:  >/= 1.5 L/day    Corrin Parker, MS, RD, LDN Pager # [phone] After hours/ weekend pager # [phone]

## 2018-05-17 NOTE — Progress Notes (Addendum)
Physical Therapy Treatment Patient Details Name: Melody Gray MRN: [id number] DOB: August 05, 1970 Today's Date: 05/17/2018    History of Present Illness 48 y.o. female admitted on 05/01/18 for cough, SOB, and loss of appetite.  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  Her course was complicated by PEA arrest/code on 05/03/18 had to be ventilated (difficult airway despite already having a trach in place (it was cuffless) also found to have pulseless L lower extremity.  Pt dx with compartment syndrome and underwent faciotomy on 05/03/18 (left open with wound vac), faciotomies closed on 05/06/18.  She continues to be on/off the ventilator as of 05/08/18.  Pt changed to a #4 cuffless trach on 05/10/18    PT Comments    Pt pleasant and moving well with antalgic gait more pronounced this session than last time with this therapist. Pt reports concern over returning home but clearly stating she has no desire for SNF. Pt educated for activity progression using rollator at home to modify activity and for energy conservation. Pt educated for HEP and encouraged to perform 3x/day, as well as ambulation to toilet and increased gait in room and at home with slow progression. Pt required 6L during gait with sPO2 88-95%. Pt cleaning trach end of session and reports improved breathing however also noted with PMSV off during gait pt with increased SPO2 however pt adamant it was related to trach needing cleaning not PMSV.    Follow Up Recommendations  Home health PT;Supervision - Intermittent     Equipment Recommendations  Rolling walker with 5" wheels(rollator)    Recommendations for Other Services       Precautions / Restrictions Precautions Precautions: Fall Precaution Comments: trach, PMSV    Mobility  Bed Mobility               General bed mobility comments: pt sitting EOB on arrival  Transfers Overall transfer level: Modified independent                General transfer comment: pt able to stand from EOB and toilet with rail without assist, increased time  Ambulation/Gait Ambulation/Gait assistance: Min guard Gait Distance (Feet): 150 Feet Assistive device: Rolling walker (2 wheeled) Gait Pattern/deviations: Step-to pattern;Decreased step length - right;Decreased stance time - left;Decreased stride length;Trunk flexed;Antalgic   Gait velocity interpretation: <1.8 ft/sec, indicate of risk for recurrent falls General Gait Details: Pt with antalgic gait today due to LLE soreness with cues for normalization of gait/ increased swing through and equal strides. Pt with one seated rest after grossly 60' then able to stand and continue   Marine scientist [place] (Stroke Patients Only)       Balance Overall balance assessment: Needs assistance Sitting-balance support: No upper extremity supported;Feet supported Sitting balance-Leahy Scale: Good     Standing balance support: Single extremity supported Standing balance-Leahy Scale: Poor Standing balance comment: Reliant on UE support                            Cognition Arousal/Alertness: Awake/alert Behavior During Therapy: WFL for tasks assessed/performed Overall Cognitive Status: Within Functional Limits for tasks assessed  Exercises General Exercises - Lower Extremity Long Arc Quad: AROM;15 reps;Seated;Left Hip Flexion/Marching: AROM;15 reps;Seated;Left    General Comments        Pertinent Vitals/Pain Faces Pain Scale: Hurts little more Pain Location: left leg Pain Descriptors / Indicators: Aching;Sore Pain Intervention(s): Limited activity within patient's tolerance    Home Living                      Prior Function            PT Goals (current goals can now be found in the care plan section) Progress towards PT goals: Progressing toward goals     Frequency           PT Plan Discharge plan needs to be updated    Co-evaluation              AM-PAC PT "6 Clicks" Mobility   Outcome Measure  Help needed turning from your back to your side while in a flat bed without using bedrails?: A Little Help needed moving from lying on your back to sitting on the side of a flat bed without using bedrails?: A Little Help needed moving to and from a bed to a chair (including a wheelchair)?: A Little Help needed standing up from a chair using your arms (e.g., wheelchair or bedside chair)?: A Little Help needed to walk in hospital room?: A Little Help needed climbing 3-5 steps with a railing? : A Lot 6 Click Score: 17    End of Session Equipment Utilized During Treatment: Oxygen Activity Tolerance: Patient tolerated treatment well Patient left: in bed;with call bell/phone within reach;with family/visitor present;with nursing/sitter in room Nurse Communication: Mobility status PT Visit Diagnosis: Muscle weakness (generalized) (M62.81);Difficulty in walking, not elsewhere classified (R26.2);Pain     Time: 2094-7096 PT Time Calculation (min) (ACUTE ONLY): 39 min  Charges:  $Gait Training: 8-22 mins $Therapeutic Activity: 8-22 mins $Self Care/Home Management: [place], PT Acute Rehabilitation Services Pager: [phone] Office: [place] 05/17/2018, 1:37 PM

## 2018-05-17 NOTE — Progress Notes (Signed)
Subjective: Melody Gray was seen and evaluated at bedside. She was quite drowsy on our evaluation and did not interact very much. She had just received Oxycodone 10 mg a few hours beforehand. She was not in any distress and was resting comfortably.   Objective:  Vital signs in last 24 hours: Vitals:   05/17/18 0728 05/17/18 0832 05/17/18 1120 05/17/18 1213  BP:  129/67  (!) 146/72  Pulse: 93 78 98 84  Resp: 19 20 17 19   Temp:  97.8 F (36.6 C)  97.7 F (36.5 C)  TempSrc:  Oral  Oral  SpO2: 95% 96% 100% 98%  Weight:      Height:       General: drowsy but arousable; lying in bed in NAD CV: RRR; no murmurs, rubs or gallops Abd: BS+; abdomen is soft, non-tender, non-distended   Assessment/Plan:  Principal Problem:   Upper GI bleed  secondary to gastric ulcer Active Problems:   Sarcoidosis (HCC)   Insulin-requiring or dependent type II diabetes mellitus (HCC)   Hypertension complicating diabetes (HCC)   Acute on chronic respiratory failure with hypoxemia (HCC)   Acute kidney injury (AKI) with acute tubular necrosis (ATN) (HCC)   Tracheostomy dependent (HCC)   Chronic respiratory failure with hypoxia (HCC)/ trach dep   Hyperkalemia   Chronic pulmonary hypertension (HCC)   Cardiac arrest (HCC)   Nontraumatic compartment syndrome of left lower extremity   MRSA bacteremia   Pneumonia   Morbid obesity (HCC)   Tobacco use disorder   Acute blood loss anemia   Gastric ulcer with hemorrhage   Esophageal candidiasis (HCC)   Left renal mass- 3.1cm   Melody Gray is a 48 yo F with history of sarcoidosis with pulmonary and hepatic involvement, chronic hypoxic respiratory failure 2/2 sarcoidosis and severe OSA, difficult airway from prior traumatic intubation, CKD IV, tobacco use disorder who presented with AoC HF exacerbation and had a prolonged PEA arrest 2/2 hyperkalemia. Hospital course was complicated by multifocal PNA for which she has completed tx and MRSA bacteremia.She then  developed acute blood loss anemia 2/2 UGIB in the setting of acute illness and oral steroids for gout flare.  1.Upper GI bleed2/2 gastric ulcer:  -s/p EGD with clipping 05/13/18  - biopsies negative for H. Pylori - hgbstable at 7.9;  no signs or symptoms of active bleeding and remains hemodynamically stable -tolerating full diet - she will continue BID Protonix for 8 weeks and will follow-up with GI outpatient for repeat EGD in 8 weeks  2. Esophageal candidiasis  -ContinueFluconazole 100 mg daily x2 weeks  3.Acute on chronic hypoxic respiratory failure 2/2 acute on chronic HFpEF exacerbation: Extubated on 1/9.Now on her home 2 L ofO2 NCas well as size4cuffless trachand speaking valve. Respiratory status remainsstable.   4.MRSA bacteremia:completed 2 week course of Zyvox  5.AoCKD Stage IV with hyperkalemia: -Baseline Cr 2.4-2.7. -crt has been stable between 4-4.5 the last 2 days;likely ATN in the setting of acute blood lossanemia that worsened from acute injury with PEA arrest -potassium remains elevated at 5.5; she received IVF overnight and was started on Lokelma 10 g daily  - will recheck BMP in the morning and if remains stable will be stable for discharge home   5.R foot gout flare:Resolved  6.Chest wall pain: 2/2 CPR. - painwell controlled; decreasing Oxy from 10 to 5 mg given impaired renal function and potential increased sedation -continue bowel regimen   7.IDDM: - CBGsstable on Levemir 30 units and resistant SSI  8.Renal  mass: Radiology recommendsMRI in 14mo or USif patient unable to undergo MRI.  Dispo: Patient has decided she would rather go home rather than pursue SNF placement. Anticipated discharge home tomorrow pending stable renal function and potassium.   Modena Nunnery D, DO 05/17/2018, 2:45 PM Pager: [phone]

## 2018-05-17 NOTE — Care Management Note (Signed)
Case Management Note  Patient Details  Name: Melody Gray MRN: [id number] Date of Birth: 1970/11/08  Subjective/Objective:                  48 y.o. female admitted on 05/01/18 for cough, SOB, and loss of appetite.  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  Her course was complicated by PEA arrest/code on 05/03/18 had to be ventilated; also found to have pulseless L lower extremity.  Pt dx with compartment syndrome and underwent faciotomy on 05/03/18.  PTA, pt resided at home with daughter, who works.    Action/Plan: Pt considering possible SNF at discharge, as pt too high level for CIR.  Pt states she will not have 24h supervision at home, as daughter and sister both work.  She is considering possible home dc, but needs to think on it.  CMS list of [facility] providers given to pt for review.  Will follow up with pt on her decision.  Await medical stability for dc.   Expected Discharge Date:  05/18/2018 Expected Discharge Plan:  [place]  In-House Referral:     Discharge planning Services  CM Consult  Post Acute Care Choice:  Durable Medical Equipment, Home Health([facility]-home 02,trach collar) Choice offered to:  Patient  DME Arranged:  Walker rolling with seat DME Agency:  [place] Arranged:  RN, PT, Disease Management, OT [place] Agency:  [place]  Status of Service:  Completed, signed off  If discussed at [place] of Stay Meetings, dates discussed:    Additional Comments:  05/17/2018 J.Sarann Tregre, RN, BSN Met with pt and her husband to discuss dc planning.  She desires to go home with [facility] care.  Husband Jeneen Rinks states he plans to adjust his work schedule so that someone will be with pt 24/7 for the first couple of weeks.  He is so happy that she is coming home, and is tearful.  Referral to [facility] for [facility] follow up, per pt choice.  Start of care 24-48h post dc date.  Rollator RW ordered from [facility] to be delivered to  bedside prior to dc.    Reinaldo Raddle, RN, BSN  Trauma/Neuro ICU Case Manager [phone]

## 2018-05-18 DIAGNOSIS — Z8739 Personal history of other diseases of the musculoskeletal system and connective tissue: Secondary | ICD-10-CM

## 2018-05-18 DIAGNOSIS — Z8614 Personal history of Methicillin resistant Staphylococcus aureus infection: Secondary | ICD-10-CM

## 2018-05-18 DIAGNOSIS — Z9981 Dependence on supplemental oxygen: Secondary | ICD-10-CM

## 2018-05-18 DIAGNOSIS — N2889 Other specified disorders of kidney and ureter: Secondary | ICD-10-CM

## 2018-05-18 LAB — BASIC METABOLIC PANEL
Anion gap: 10 (ref 5–15)
Anion gap: 9 (ref 5–15)
BUN: 76 mg/dL — ABNORMAL HIGH (ref 6–20)
BUN: 71 mg/dL — ABNORMAL HIGH (ref 6–20)
CO2: 24 mmol/L (ref 22–32)
CO2: 27 mmol/L (ref 22–32)
Calcium: 9 mg/dL (ref 8.9–10.3)
Calcium: 9.3 mg/dL (ref 8.9–10.3)
Chloride: 106 mmol/L (ref 98–111)
Chloride: 107 mmol/L (ref 98–111)
Creatinine, Ser: 4.05 mg/dL — ABNORMAL HIGH (ref 0.44–1.00)
Creatinine, Ser: 3.93 mg/dL — ABNORMAL HIGH (ref 0.44–1.00)
GFR calc Af Amer: 14 mL/min — ABNORMAL LOW (ref >60)
GFR calc Af Amer: 15 mL/min — ABNORMAL LOW (ref >60)
GFR calc non Af Amer: 12 mL/min — ABNORMAL LOW (ref >60)
GFR calc non Af Amer: 13 mL/min — ABNORMAL LOW (ref >60)
Glucose, Bld: 142 mg/dL — ABNORMAL HIGH (ref 70–99)
Glucose, Bld: 130 mg/dL — ABNORMAL HIGH (ref 70–99)
Potassium: 5.7 mmol/L — ABNORMAL HIGH (ref 3.5–5.1)
Potassium: 5.2 mmol/L — ABNORMAL HIGH (ref 3.5–5.1)
Sodium: 140 mmol/L (ref 135–145)
Sodium: 143 mmol/L (ref 135–145)

## 2018-05-18 LAB — GLUCOSE, CAPILLARY
Glucose-Capillary: 117 mg/dL — ABNORMAL HIGH (ref 70–99)
Glucose-Capillary: 126 mg/dL — ABNORMAL HIGH (ref 70–99)
Glucose-Capillary: 131 mg/dL — ABNORMAL HIGH (ref 70–99)

## 2018-05-18 MED ORDER — PATIROMER SORBITEX CALCIUM 8.4 G PO PACK: 8.4000 g | PACK | Freq: Every day | ORAL | 0 refills | Status: DC

## 2018-05-18 MED ORDER — FLUCONAZOLE 100 MG PO TABS: 100.0000 mg | ORAL_TABLET | Freq: Every day | ORAL | 0 refills | Status: AC

## 2018-05-18 MED ORDER — PANTOPRAZOLE SODIUM 40 MG PO TBEC: 40.0000 mg | DELAYED_RELEASE_TABLET | Freq: Two times a day (BID) | ORAL | 1 refills | Status: AC

## 2018-05-18 NOTE — Progress Notes (Signed)
Medicine attending discharge note: I personally examined this patient today together with resident physician Modena Nunnery and I attest to the accuracy of her discharge evaluation and plan as recorded in her final progress note and discharge summary to follow.  48 year old woman with history of sarcoidosis involving liver and lung with advanced hypoxic respiratory failure, chronic tracheostomy from previous traumatic intubation, CKD 4, admitted on January 1 with acute on chronic hypoxic respiratory failure.  She developed a PEA arrest concomitant with worsening in her renal function on day of admission with potassium up to 7.  She required prolonged resuscitation.  Subsequent acute upper GI bleed with stress ulceration of the stomach on January 12 with fall in hemoglobin down to 5.  Additional complications included MRSA bacteremia and pneumonia.  Treated with a two-week course of Zyvox.  Additional findings of esophageal candidiasis on endoscopy treated with fluconazole. Renal function has stabilized but she has persistent hyperkalemia.  Peak creatinine 5.0 on January 5, initial improvement down to 3.3 with subsequent deterioration in view of hypotension associated with acute upper GI bleed.  Peak creatinine back up to 4.7 by January 12.  Slowly improving since that time and creatinine on day of discharge January 18, 4.1.  Bicarbonate 24, potassium 5.7, anion gap 10.  Slowly declining hemoglobin from 8.7 on January 13 to 7.9 on day of discharge.  Disposition: Condition stable enough for discharge She would like to go home.  Home physical therapy. Multiple hospital complications as summarized above. Follow-up in our general medicine clinic and with nephrology

## 2018-05-18 NOTE — Progress Notes (Signed)
Pt to be discharge to home. Home Health to follow up. AVS summary reviewed in detail with patient and all questions answered or referred to AVS. All appointments reviewed and transportation available. Pt states she has trach supplies and O2 at home. No distress at this time awaiting family. IV dc and all belongings accounted for. Phone and computer with patient belongings. Simmie Davies RN

## 2018-05-18 NOTE — Plan of Care (Signed)
Pt states she has spoken to providers about her discharge planning. Simmie Davies RN

## 2018-05-18 NOTE — Progress Notes (Signed)
Subjective: Melody Gray was seen and evaluated on morning rounds. No acute events overnight. She is feeling well. Her leg pain and chest wall pain are well controlled. No n/v, melena. She is slightly apprehensive about going home which she states is normal for her before any hospital discharge. We assured her that she will have close follow-up in our clinic and will also need to see her nephrologist soon.  Objective:  Vital signs in last 24 hours: Vitals:   05/18/18 0300 05/18/18 0352 05/18/18 0828 05/18/18 1142  BP: 129/70 129/70 (!) 146/77 140/76  Pulse: (!) 102 (!) 105 (!) 105 81  Resp: 14 14 16 13   Temp: 98.7 F (37.1 C)  98.6 F (37 C) 98.3 F (36.8 C)  TempSrc: Oral  Oral Oral  SpO2: 98% 99% 100% 100%  Weight:      Height:       General: awake, alert, sitting up in bed in NAD CV: RRR; no murmurs, rubs or gallops Ext: LLE with well healing fasciotomy scar and 1+ pitting edema   Assessment/Plan:  Principal Problem:   Upper GI bleed  secondary to gastric ulcer Active Problems:   Sarcoidosis ([place])   Insulin-requiring or dependent type II diabetes mellitus ([place])   Hypertension complicating diabetes ([place])   Acute on chronic respiratory failure with hypoxemia (HCC)   Acute kidney injury (AKI) with acute tubular necrosis (ATN) (HCC)   Tracheostomy dependent (HCC)   Chronic respiratory failure with hypoxia (HCC)/ trach dep   Hyperkalemia   Chronic pulmonary hypertension (HCC)   Cardiac arrest (HCC)   Nontraumatic compartment syndrome of left lower extremity   MRSA bacteremia   Pneumonia   Morbid obesity (HCC)   Tobacco use disorder   Acute blood loss anemia   Gastric ulcer with hemorrhage   Esophageal candidiasis (HCC)   Left renal mass- 3.1cm   [name] is a 48 yo F with history of sarcoidosis with pulmonary and hepatic involvement, chronic hypoxic respiratory failure 2/2 sarcoidosis and severe OSA, difficult airway from prior traumatic intubation, CKD IV, tobacco use  disorder who presented with AoC HF exacerbation and had a prolonged PEA arrest 2/2 hyperkalemia. Hospital course was complicated by multifocal PNA for which she has completed tx and MRSA bacteremia.She then developed acute blood loss anemia 2/2 UGIB in the setting of acute illness and oral steroids for gout flare.  1.Upper GI bleed2/2 gastric ulcer:  -s/p EGD with clipping 05/13/18  - biopsies negative for H. Pylori - hgb has remained stable for the last 3 days; no signs of further bleeding -tolerating full diet -she will continue BID Protonix for 8 weeks and will follow-up with GI outpatient forrepeat EGD in 8 weeks  2. Esophageal candidiasis  -ContinueFluconazole 100 mg daily x2 weeks  3.Acute on chronic hypoxic respiratory failure 2/2 acute on chronic HFpEF exacerbation: Extubated on 1/9.Now on her home 2 L ofO2 NCas well as size4cuffless trachand speaking valve. Respiratory status remainsstable.   4.MRSA bacteremia:completed 2 week course of Zyvox  5.AoCKD Stage IV with hyperkalemia: -Baseline Cr 2.4-2.7. -crt has been stable between 4-4.5;likely ATN in the setting of acute blood lossanemia that worsened from acute injury with PEA arrest; renal function should continue to improve over the next few days -potassium remains elevated at 5.7; have switched therapy to [facility] which can be increased weekly as needed - suspect her potassium will improve as her bowel movements return to normal when she gets home and will be off opiates  - we  are rechecking BMP this afternoon with plans to discharge home today with close follow-up in our clinic early next week to recheck BMP  - she also has established care with [facility] and will need follow-up outpatient   5.R foot gout flare:Resolved  6.Chest wall pain: 2/2 CPR. - pain has significantly improved - can continue using Tylenol as needed for pain   7.IDDM: - CBGshave been well  controlled - will resume her home regimen at discharge   8.Renal mass: Radiology recommendsMRI in 51mo or USif patient unable to undergo MRI.  Dispo: Patient is medically stable for discharge home with home health services.   Modena Nunnery D, DO 05/18/2018, 11:45 AM Pager: [phone]

## 2018-05-19 ENCOUNTER — Other Ambulatory Visit: Payer: Self-pay | Admitting: Internal Medicine

## 2018-05-19 DIAGNOSIS — Z794 Long term (current) use of insulin: Secondary | ICD-10-CM

## 2018-05-19 DIAGNOSIS — E0800 Diabetes mellitus due to underlying condition with hyperosmolarity without nonketotic hyperglycemic-hyperosmolar coma (NKHHC): Principal | ICD-10-CM

## 2018-05-21 DIAGNOSIS — Z930 Tracheostomy status: Secondary | ICD-10-CM | POA: Diagnosis not present

## 2018-05-21 DIAGNOSIS — E11319 Type 2 diabetes mellitus with unspecified diabetic retinopathy without macular edema: Secondary | ICD-10-CM | POA: Diagnosis not present

## 2018-05-21 DIAGNOSIS — Z794 Long term (current) use of insulin: Secondary | ICD-10-CM | POA: Diagnosis not present

## 2018-05-21 DIAGNOSIS — Z9981 Dependence on supplemental oxygen: Secondary | ICD-10-CM | POA: Diagnosis not present

## 2018-05-21 DIAGNOSIS — I272 Pulmonary hypertension, unspecified: Secondary | ICD-10-CM | POA: Diagnosis not present

## 2018-05-21 DIAGNOSIS — J9611 Chronic respiratory failure with hypoxia: Secondary | ICD-10-CM | POA: Diagnosis not present

## 2018-05-21 DIAGNOSIS — D860 Sarcoidosis of lung: Secondary | ICD-10-CM | POA: Diagnosis not present

## 2018-05-21 DIAGNOSIS — Z4789 Encounter for other orthopedic aftercare: Secondary | ICD-10-CM | POA: Diagnosis not present

## 2018-05-21 NOTE — Telephone Encounter (Signed)
Refill approved.

## 2018-05-22 ENCOUNTER — Ambulatory Visit (INDEPENDENT_AMBULATORY_CARE_PROVIDER_SITE_OTHER): Payer: Medicare Other | Admitting: Internal Medicine

## 2018-05-22 ENCOUNTER — Telehealth: Payer: Self-pay | Admitting: *Deleted

## 2018-05-22 VITALS — BP 130/63 | HR 85 | Temp 98.7°F | Wt 211.6 lb

## 2018-05-22 DIAGNOSIS — Z8701 Personal history of pneumonia (recurrent): Secondary | ICD-10-CM | POA: Diagnosis not present

## 2018-05-22 DIAGNOSIS — E113299 Type 2 diabetes mellitus with mild nonproliferative diabetic retinopathy without macular edema, unspecified eye: Secondary | ICD-10-CM | POA: Diagnosis not present

## 2018-05-22 DIAGNOSIS — Z930 Tracheostomy status: Secondary | ICD-10-CM

## 2018-05-22 DIAGNOSIS — Z9889 Other specified postprocedural states: Secondary | ICD-10-CM | POA: Diagnosis not present

## 2018-05-22 DIAGNOSIS — F17211 Nicotine dependence, cigarettes, in remission: Secondary | ICD-10-CM | POA: Diagnosis not present

## 2018-05-22 DIAGNOSIS — M79A22 Nontraumatic compartment syndrome of left lower extremity: Secondary | ICD-10-CM | POA: Diagnosis not present

## 2018-05-22 DIAGNOSIS — Z8719 Personal history of other diseases of the digestive system: Secondary | ICD-10-CM

## 2018-05-22 DIAGNOSIS — M79A21 Nontraumatic compartment syndrome of right lower extremity: Secondary | ICD-10-CM | POA: Diagnosis not present

## 2018-05-22 DIAGNOSIS — E875 Hyperkalemia: Secondary | ICD-10-CM | POA: Diagnosis not present

## 2018-05-22 DIAGNOSIS — K250 Acute gastric ulcer with hemorrhage: Secondary | ICD-10-CM | POA: Diagnosis not present

## 2018-05-22 DIAGNOSIS — J690 Pneumonitis due to inhalation of food and vomit: Secondary | ICD-10-CM

## 2018-05-22 DIAGNOSIS — Z8674 Personal history of sudden cardiac arrest: Secondary | ICD-10-CM | POA: Diagnosis not present

## 2018-05-22 DIAGNOSIS — Z794 Long term (current) use of insulin: Secondary | ICD-10-CM | POA: Diagnosis not present

## 2018-05-22 DIAGNOSIS — M79A29 Nontraumatic compartment syndrome of unspecified lower extremity: Secondary | ICD-10-CM

## 2018-05-22 LAB — BASIC METABOLIC PANEL
Anion gap: 10 (ref 5–15)
BUN: 23 mg/dL — ABNORMAL HIGH (ref 6–20)
CO2: 28 mmol/L (ref 22–32)
Calcium: 8.6 mg/dL — ABNORMAL LOW (ref 8.9–10.3)
Chloride: 102 mmol/L (ref 98–111)
Creatinine, Ser: 2.56 mg/dL — ABNORMAL HIGH (ref 0.44–1.00)
GFR calc Af Amer: 25 mL/min — ABNORMAL LOW (ref >60)
GFR calc non Af Amer: 21 mL/min — ABNORMAL LOW (ref >60)
Glucose, Bld: 72 mg/dL (ref 70–99)
Potassium: 4.5 mmol/L (ref 3.5–5.1)
Sodium: 140 mmol/L (ref 135–145)

## 2018-05-22 NOTE — Assessment & Plan Note (Signed)
Resume home medications. Follow-up in one week for home CBG monitoring after recent severe illness.

## 2018-05-22 NOTE — Assessment & Plan Note (Signed)
  History of gastric ulcer: Likely due to stress from her severe illness while hospitalized in the ICU for pneumonia.  She required multiple units of PRBCs.  Hemoglobin stabilized to discharge globin 7.9.  She denies melena, hematochezia, hematemesis, abdominal pain, weakness, fatigue, lightheadedness, or dyspnea.  Plan:  CBC ordered today, will follow-up when available.

## 2018-05-22 NOTE — Telephone Encounter (Signed)
[facility] front office staff contacted patient regarding HFU appt-pt informs front office that she has not been able to get her patiromer Melody Gray) filled and has not had any since her discharge on 1/18.  Pt's potassium 5.7 at time of discharge.  Pt scheduled for HFU today instead of Friday.  Will obtain labs on pt on arrival if appropriate and see what options we have in assisting her with getting her patiromer.[name]1/22/202010:46 AM

## 2018-05-22 NOTE — Patient Instructions (Addendum)
FOLLOW-UP INSTRUCTIONS When: In one week for a repeat BMP What to bring: All of your medications  I have not made any changes to your medications today. We can hold off on the [place] as your potassium is okay.  Today we discussed your recent hospitalization. I have obtained a lab today that shows your potassium as stable and in the normal range. In addition, I have ordered an additional lab for your blood count. I will notify you of the results of any labs from today's evaluation when available to me.   Please continue a low potassium diet for which I have provided you with a handout to assist with this. In addition, I have placed a referral for you to meet with the nutritionist.   I have ordered a chest xray for the last week of February. Please get this done at your convenience that week here at [facility].  Thank you for your visit to the [facility] today. If you have any questions or concerns please call us at [phone].

## 2018-05-22 NOTE — Telephone Encounter (Signed)
She needs a stat BMP on arrival. Thank you

## 2018-05-22 NOTE — Assessment & Plan Note (Signed)
  Hyperkalemia: Likely secondary to severe rhabdomyolysis.  Patient experienced cardiac arrest secondary to hyperkalemia with ROSC after ~40 seconds.  Although her hyperkalemia improved while inpatient she was discharged on [place] for continue therapy.  She was however, unable to obtain his medication due to affordability issues and approximate price with insurance coverage of $1100/month.  As such she has maintained control of her potassium with regular diet alone since discharge. Today she denies chest pain, palpitations, muscle aches or pain, dizziness, weakness, or lightheadedness.  Plan: BMP ordered stat returned with potassium at 4.5 representing decreased from 5.2 at discharge.  There was also improvement in her creatinine to 2.56 from 3.93 at discharge. Given the near normalization with diet alone, I recommended that she utilize a low potassium diet and have provided her with resources to assist with this process. In addition I have placed a referral for diabetes and low potassium diet management. Return in 1 week for BMP

## 2018-05-22 NOTE — Assessment & Plan Note (Signed)
  Pneumonia: She has notable improvement in her symptoms.  However she is still trach dependent and will follow-up with [facility] for [facility]ach care.  She is to call them today to schedule.  Plan:  CXR ordered for February 28th.  She may obtain this at her leisure anytime the last week and February to which she is aware.

## 2018-05-22 NOTE — Progress Notes (Signed)
   CC: Hospital discharge follow-up for pneumonia, hyperkalemia, cardiac,, bilateral lower extremity fasciotomy, and hemorrhagic gastric ulcer  HPI:Melody Gray is a 48 y.o. female who presents for evaluation of hypokalemia status post cardiac arrest, pneumonia, and emergent gastric ulcer. Please see individual problem based A/P for details.  PHQ-9: Based on the patients    Office Visit from 05/22/2018 in [place]  PHQ-9 Total Score  0     score we have decided to monitor.  Past Medical History:  Diagnosis Date  . Abnormal LFTs (liver function tests)    Liver U/S and exam c/w HSM. Hep B serology neg. but Hep C ab +, HIV neg. 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 Anemia   . Cardiac arrest ([place]) 05/03/2018  . Cardiomyopathy, nonischemic ([place])    a. Varying EF over the years - initially 35% in 09/2010. Normal cors 12/2010. b. Echo 07/2014: EF 55-60%, no RWMA, + diastolic flattening and systolic flattening c/w RV volume and pressure overload, mild LAE/RAE, mod dilated RV, mod TR, PASP severely increased at 35mmHg. c. RHC 07/2014: moderate pulmonary HTN likely WHO group 3 with markedly elevated CVP and relatively normal left sided pressures.  . Chronic diastolic CHF (congestive heart failure) ([place])   . Chronic respiratory failure ([place])    a. became O2 dependent in July 2011. b. She required trache placement in 07/2013 and has been followed by pulmonology as well.  . CKD (chronic kidney disease), stage II   . Complication of anesthesia    " difficult waking "  . Diabetes mellitus    insulin dependent  . Diabetic retinopathy    Right eye 2/11  . Difficult airway   . Essential hypertension   . Health maintenance examination    Mammogram 05/2010 Negative; Last Pap smear 03/2008; Last DM eye exam 2/11> mild non-proliferative diabetic retinopathy. OD  . Helicobacter pylori ab+ 05/2011   Pt was symptomatic and  treatment planned for 05/2011  . Hx of cardiac cath 2/08   No CAD, no RAS,  normal EF  . Hypoxemia    CT angiogram 9/11>> No PE; PFTs 10/11- FEV1 1.20 (49%) with 16% better p B2, DLCO 33%> corrects to 84; O2 sats ok on 4 lpm X rapid walk X 3 laps 05/2010  . Long term current use of systemic steroids   . Morbid obesity ([place])    Target wt= 153 for BMI <30  . Obesity hypoventilation syndrome ([place])   . Pulmonary hypertension ([place])   . QT prolongation   . Sarcoidosis    Followed by Dr. Melvyn Novas; w/ liver involvement per biopsy 12/09, Reversible airway component so started on [facility] 01/2010; HFA 75% p coaching 05/2010  . Seborrheic dermatitis of scalp   . Sleep apnea   . Tobacco abuse    Review of Systems: ROS negative except as per HPI  Physical Exam: Vitals:   05/22/18 1348  BP: 130/63  Pulse: 85  Temp: 98.7 F (37.1 C)  TempSrc: Oral  SpO2: 100%  Weight: 211 lb 9.6 oz (96 kg)   General: A/O x4, in no acute distress, afebrile, nondiaphoretic Cardio: RRR, no mrg's Pulmonary: CTA bilaterally MSK: BLE nontender, nonedematous, wounds nontender, nonedematous with clear surgical lines  Assessment & Plan:   See Encounters Tab for problem based charting.  Patient discussed with Dr. Evette Doffing

## 2018-05-22 NOTE — Assessment & Plan Note (Signed)
Bilateral Fasciotomies: 2/2 to compartment syndrome. These appear to be healing nicely. I have advised her to continue refraining from submerging the wounds until cleared by her surgeons.

## 2018-05-23 ENCOUNTER — Ambulatory Visit (INDEPENDENT_AMBULATORY_CARE_PROVIDER_SITE_OTHER): Payer: Medicare Other | Admitting: Vascular Surgery

## 2018-05-23 ENCOUNTER — Other Ambulatory Visit: Payer: Self-pay

## 2018-05-23 ENCOUNTER — Encounter: Payer: Self-pay | Admitting: Vascular Surgery

## 2018-05-23 VITALS — BP 141/85 | HR 90 | Temp 98.7°F | Resp 20 | Ht 61.0 in | Wt 211.0 lb

## 2018-05-23 DIAGNOSIS — J9611 Chronic respiratory failure with hypoxia: Secondary | ICD-10-CM | POA: Diagnosis not present

## 2018-05-23 DIAGNOSIS — Z9981 Dependence on supplemental oxygen: Secondary | ICD-10-CM | POA: Diagnosis not present

## 2018-05-23 DIAGNOSIS — E11319 Type 2 diabetes mellitus with unspecified diabetic retinopathy without macular edema: Secondary | ICD-10-CM | POA: Diagnosis not present

## 2018-05-23 DIAGNOSIS — T79A22D Traumatic compartment syndrome of left lower extremity, subsequent encounter: Secondary | ICD-10-CM

## 2018-05-23 DIAGNOSIS — I272 Pulmonary hypertension, unspecified: Secondary | ICD-10-CM | POA: Diagnosis not present

## 2018-05-23 DIAGNOSIS — Z4789 Encounter for other orthopedic aftercare: Secondary | ICD-10-CM | POA: Diagnosis not present

## 2018-05-23 DIAGNOSIS — D860 Sarcoidosis of lung: Secondary | ICD-10-CM | POA: Diagnosis not present

## 2018-05-23 LAB — CBC WITH DIFFERENTIAL/PLATELET
Basophils Absolute: 0 10*3/uL (ref 0.0–0.2)
Basos: 0 %
EOS (ABSOLUTE): 0.3 10*3/uL (ref 0.0–0.4)
Eos: 4 %
Hematocrit: 25.1 % — ABNORMAL LOW (ref 34.0–46.6)
Hemoglobin: 8.1 g/dL — ABNORMAL LOW (ref 11.1–15.9)
Immature Grans (Abs): 0.1 10*3/uL (ref 0.0–0.1)
Immature Granulocytes: 1 %
Lymphocytes Absolute: 2 10*3/uL (ref 0.7–3.1)
Lymphs: 25 %
MCH: 28.3 pg (ref 26.6–33.0)
MCHC: 32.3 g/dL (ref 31.5–35.7)
MCV: 88 fL (ref 79–97)
Monocytes Absolute: 0.8 10*3/uL (ref 0.1–0.9)
Monocytes: 10 %
Neutrophils Absolute: 4.9 10*3/uL (ref 1.4–7.0)
Neutrophils: 60 %
Platelets: 238 10*3/uL (ref 150–450)
RBC: 2.86 x10E6/uL — ABNORMAL LOW (ref 3.77–5.28)
RDW: 14 % (ref 11.7–15.4)
WBC: 8.2 10*3/uL (ref 3.4–10.8)

## 2018-05-23 NOTE — Progress Notes (Signed)
Patient is a 48 year old female who returns for follow-up today.  She underwent left leg 4 compartment fasciotomy for compartment syndrome after an interosseous catheter recently in the hospital.  She denies any numbness or tingling in her leg.  She does still have some residual swelling in her left leg.  She is able to walk.   Physical exam:  Vitals:   05/23/18 1540  BP: (!) 141/85  Pulse: 90  Resp: 20  Temp: 98.7 F (37.1 C)  TempSrc: Oral  SpO2: 100%  Weight: 211 lb (95.7 kg)  Height: 5\' 1"  (1.549 m)    Right lower extremity: Approximately 20% larger than the left lower extremity both fasciotomy incisions are well-healed with nylons in place these were removed today right foot is pink warm palpable 2+ dorsalis pedis pulse  Assessment: Well-healed fasciotomy wounds still with some residual swelling hopefully will resolve over time.  Plan: Patient will follow-up with me on as-needed basis.  Ruta Hinds, MD [facility] Office: [phone] Pager: [phone]

## 2018-05-23 NOTE — Progress Notes (Signed)
Internal Medicine Clinic Attending  Case discussed with Dr. Harbrecht at the time of the visit.  We reviewed the resident's history and exam and pertinent patient test results.  I agree with the assessment, diagnosis, and plan of care documented in the resident's note.   

## 2018-05-24 ENCOUNTER — Ambulatory Visit: Payer: Medicare Other

## 2018-05-24 ENCOUNTER — Other Ambulatory Visit: Payer: Self-pay

## 2018-05-24 ENCOUNTER — Telehealth: Payer: Self-pay

## 2018-05-24 ENCOUNTER — Telehealth: Payer: Self-pay | Admitting: Internal Medicine

## 2018-05-24 DIAGNOSIS — I272 Pulmonary hypertension, unspecified: Secondary | ICD-10-CM | POA: Diagnosis not present

## 2018-05-24 DIAGNOSIS — Z9981 Dependence on supplemental oxygen: Secondary | ICD-10-CM | POA: Diagnosis not present

## 2018-05-24 DIAGNOSIS — J9611 Chronic respiratory failure with hypoxia: Secondary | ICD-10-CM | POA: Diagnosis not present

## 2018-05-24 DIAGNOSIS — Z794 Long term (current) use of insulin: Principal | ICD-10-CM

## 2018-05-24 DIAGNOSIS — E11319 Type 2 diabetes mellitus with unspecified diabetic retinopathy without macular edema: Secondary | ICD-10-CM | POA: Diagnosis not present

## 2018-05-24 DIAGNOSIS — Z4789 Encounter for other orthopedic aftercare: Secondary | ICD-10-CM | POA: Diagnosis not present

## 2018-05-24 DIAGNOSIS — E113299 Type 2 diabetes mellitus with mild nonproliferative diabetic retinopathy without macular edema, unspecified eye: Secondary | ICD-10-CM

## 2018-05-24 DIAGNOSIS — D860 Sarcoidosis of lung: Secondary | ICD-10-CM | POA: Diagnosis not present

## 2018-05-24 NOTE — Telephone Encounter (Signed)
Received TC from [place] with [facility] and she states she spoke with pt about 10 min ago, pt c/o being hungry and sweating.  Angel with advanced instructed pt to eat and check BS.  Glenard Haring states she will not be making a home visit and tried calling pt to check on her, unable to get an answer.  This RN called pt and pt states she is now feeling much better. Pt states she did not eat much today, but has now eaten candy and a sandwich. Pt has not checked her BS yet because she can't find her meter.  Pt also states she needs RX for test strips.  RX was sent in 03/2018, transmission failed, this RN called RX in at this time. Importance of adhering to normal eating pattern reiterated to patient.   Pt informed to call triage on Monday and report BS readings, she verbalized understanding. Will forward to PCP. SChaplin, RN,BSN

## 2018-05-24 NOTE — Telephone Encounter (Signed)
Called patient, no response. Will discuss results on Monday.

## 2018-05-24 NOTE — Telephone Encounter (Signed)
Angel with [facility] requesting Accu-chek test strip to be filled @  [place], [address] [phone] (Phone) [phone] (Fax)   Please call back.

## 2018-05-25 MED ORDER — GLUCOSE BLOOD VI STRP: ORAL_STRIP | 11 refills | Status: AC

## 2018-05-25 NOTE — Addendum Note (Signed)
Addended by: Neva Seat B on: 05/25/2018 02:34 PM   Modules accepted: Orders

## 2018-05-25 NOTE — Telephone Encounter (Signed)
I agree with this. Refill for strips recently sent again just in case there is any further issue. Thank you.  Pearson Grippe

## 2018-05-25 NOTE — Telephone Encounter (Signed)
Refill Approved

## 2018-05-27 ENCOUNTER — Telehealth: Payer: Self-pay | Admitting: Internal Medicine

## 2018-05-27 DIAGNOSIS — J9611 Chronic respiratory failure with hypoxia: Secondary | ICD-10-CM | POA: Diagnosis not present

## 2018-05-27 DIAGNOSIS — D860 Sarcoidosis of lung: Secondary | ICD-10-CM | POA: Diagnosis not present

## 2018-05-27 DIAGNOSIS — I272 Pulmonary hypertension, unspecified: Secondary | ICD-10-CM | POA: Diagnosis not present

## 2018-05-27 DIAGNOSIS — E11319 Type 2 diabetes mellitus with unspecified diabetic retinopathy without macular edema: Secondary | ICD-10-CM | POA: Diagnosis not present

## 2018-05-27 DIAGNOSIS — Z9981 Dependence on supplemental oxygen: Secondary | ICD-10-CM | POA: Diagnosis not present

## 2018-05-27 DIAGNOSIS — Z4789 Encounter for other orthopedic aftercare: Secondary | ICD-10-CM | POA: Diagnosis not present

## 2018-05-27 NOTE — Telephone Encounter (Signed)
Needs prior authorization prescription# [id number]

## 2018-05-27 NOTE — Telephone Encounter (Addendum)
Call to pharmacy medication for PA was [place].  Medication has been placed on hold as patients labs had improved per Dr. Berline Lopes.  Pharmacy was called and notified of.  Sander Nephew, RN 05/27/2018. 12:10 PM.

## 2018-05-28 DIAGNOSIS — I272 Pulmonary hypertension, unspecified: Secondary | ICD-10-CM | POA: Diagnosis not present

## 2018-05-28 DIAGNOSIS — Z4789 Encounter for other orthopedic aftercare: Secondary | ICD-10-CM | POA: Diagnosis not present

## 2018-05-28 DIAGNOSIS — Z9981 Dependence on supplemental oxygen: Secondary | ICD-10-CM | POA: Diagnosis not present

## 2018-05-28 DIAGNOSIS — D860 Sarcoidosis of lung: Secondary | ICD-10-CM | POA: Diagnosis not present

## 2018-05-28 DIAGNOSIS — E11319 Type 2 diabetes mellitus with unspecified diabetic retinopathy without macular edema: Secondary | ICD-10-CM | POA: Diagnosis not present

## 2018-05-28 DIAGNOSIS — J9611 Chronic respiratory failure with hypoxia: Secondary | ICD-10-CM | POA: Diagnosis not present

## 2018-05-29 ENCOUNTER — Other Ambulatory Visit: Payer: Medicare Other

## 2018-05-30 ENCOUNTER — Other Ambulatory Visit (INDEPENDENT_AMBULATORY_CARE_PROVIDER_SITE_OTHER): Payer: Medicare Other

## 2018-05-30 DIAGNOSIS — I272 Pulmonary hypertension, unspecified: Secondary | ICD-10-CM | POA: Diagnosis not present

## 2018-05-30 DIAGNOSIS — Z9981 Dependence on supplemental oxygen: Secondary | ICD-10-CM | POA: Diagnosis not present

## 2018-05-30 DIAGNOSIS — E11319 Type 2 diabetes mellitus with unspecified diabetic retinopathy without macular edema: Secondary | ICD-10-CM | POA: Diagnosis not present

## 2018-05-30 DIAGNOSIS — D860 Sarcoidosis of lung: Secondary | ICD-10-CM | POA: Diagnosis not present

## 2018-05-30 DIAGNOSIS — J9611 Chronic respiratory failure with hypoxia: Secondary | ICD-10-CM | POA: Diagnosis not present

## 2018-05-30 DIAGNOSIS — E875 Hyperkalemia: Secondary | ICD-10-CM | POA: Diagnosis not present

## 2018-05-30 DIAGNOSIS — Z4789 Encounter for other orthopedic aftercare: Secondary | ICD-10-CM | POA: Diagnosis not present

## 2018-05-31 ENCOUNTER — Encounter: Payer: Medicare Other | Admitting: Dietician

## 2018-05-31 ENCOUNTER — Telehealth: Payer: Self-pay | Admitting: Internal Medicine

## 2018-05-31 LAB — BMP8+ANION GAP
Anion Gap: 18 mmol/L (ref 10.0–18.0)
BUN/Creatinine Ratio: 10 (ref 9–23)
BUN: 24 mg/dL (ref 6–24)
CO2: 23 mmol/L (ref 20–29)
Calcium: 8.8 mg/dL (ref 8.7–10.2)
Chloride: 103 mmol/L (ref 96–106)
Creatinine, Ser: 2.38 mg/dL — ABNORMAL HIGH (ref 0.57–1.00)
GFR calc Af Amer: 27 mL/min/{1.73_m2} — ABNORMAL LOW (ref >59)
GFR calc non Af Amer: 23 mL/min/{1.73_m2} — ABNORMAL LOW (ref >59)
Glucose: 118 mg/dL — ABNORMAL HIGH (ref 65–99)
Potassium: 4.8 mmol/L (ref 3.5–5.2)
Sodium: 144 mmol/L (ref 134–144)

## 2018-05-31 NOTE — Telephone Encounter (Signed)
Called to notify the patient of her CBC and BMP results. She understood that her renal function was slowly improving and that her Hgb was stable. She denied acute issues today.   Kathi Ludwig, MD [facility] Internal Medicine, PGY-2

## 2018-06-04 DIAGNOSIS — I272 Pulmonary hypertension, unspecified: Secondary | ICD-10-CM | POA: Diagnosis not present

## 2018-06-04 DIAGNOSIS — Z4789 Encounter for other orthopedic aftercare: Secondary | ICD-10-CM | POA: Diagnosis not present

## 2018-06-04 DIAGNOSIS — J9611 Chronic respiratory failure with hypoxia: Secondary | ICD-10-CM | POA: Diagnosis not present

## 2018-06-04 DIAGNOSIS — Z9981 Dependence on supplemental oxygen: Secondary | ICD-10-CM | POA: Diagnosis not present

## 2018-06-04 DIAGNOSIS — D860 Sarcoidosis of lung: Secondary | ICD-10-CM | POA: Diagnosis not present

## 2018-06-04 DIAGNOSIS — E11319 Type 2 diabetes mellitus with unspecified diabetic retinopathy without macular edema: Secondary | ICD-10-CM | POA: Diagnosis not present

## 2018-06-05 DIAGNOSIS — I272 Pulmonary hypertension, unspecified: Secondary | ICD-10-CM | POA: Diagnosis not present

## 2018-06-05 DIAGNOSIS — E11319 Type 2 diabetes mellitus with unspecified diabetic retinopathy without macular edema: Secondary | ICD-10-CM | POA: Diagnosis not present

## 2018-06-05 DIAGNOSIS — J9611 Chronic respiratory failure with hypoxia: Secondary | ICD-10-CM | POA: Diagnosis not present

## 2018-06-05 DIAGNOSIS — D860 Sarcoidosis of lung: Secondary | ICD-10-CM | POA: Diagnosis not present

## 2018-06-05 DIAGNOSIS — Z9981 Dependence on supplemental oxygen: Secondary | ICD-10-CM | POA: Diagnosis not present

## 2018-06-05 DIAGNOSIS — Z4789 Encounter for other orthopedic aftercare: Secondary | ICD-10-CM | POA: Diagnosis not present

## 2018-06-06 ENCOUNTER — Encounter: Payer: Medicare Other | Admitting: Dietician

## 2018-06-10 DIAGNOSIS — Z9981 Dependence on supplemental oxygen: Secondary | ICD-10-CM | POA: Diagnosis not present

## 2018-06-10 DIAGNOSIS — D860 Sarcoidosis of lung: Secondary | ICD-10-CM | POA: Diagnosis not present

## 2018-06-10 DIAGNOSIS — J9611 Chronic respiratory failure with hypoxia: Secondary | ICD-10-CM | POA: Diagnosis not present

## 2018-06-10 DIAGNOSIS — E11319 Type 2 diabetes mellitus with unspecified diabetic retinopathy without macular edema: Secondary | ICD-10-CM | POA: Diagnosis not present

## 2018-06-10 DIAGNOSIS — Z4789 Encounter for other orthopedic aftercare: Secondary | ICD-10-CM | POA: Diagnosis not present

## 2018-06-10 DIAGNOSIS — I272 Pulmonary hypertension, unspecified: Secondary | ICD-10-CM | POA: Diagnosis not present

## 2018-06-11 ENCOUNTER — Telehealth: Payer: Self-pay | Admitting: Internal Medicine

## 2018-06-11 DIAGNOSIS — J9611 Chronic respiratory failure with hypoxia: Secondary | ICD-10-CM | POA: Diagnosis not present

## 2018-06-11 DIAGNOSIS — D860 Sarcoidosis of lung: Secondary | ICD-10-CM | POA: Diagnosis not present

## 2018-06-11 DIAGNOSIS — Z4789 Encounter for other orthopedic aftercare: Secondary | ICD-10-CM | POA: Diagnosis not present

## 2018-06-11 DIAGNOSIS — Z9981 Dependence on supplemental oxygen: Secondary | ICD-10-CM | POA: Diagnosis not present

## 2018-06-11 DIAGNOSIS — E11319 Type 2 diabetes mellitus with unspecified diabetic retinopathy without macular edema: Secondary | ICD-10-CM | POA: Diagnosis not present

## 2018-06-11 DIAGNOSIS — I272 Pulmonary hypertension, unspecified: Secondary | ICD-10-CM | POA: Diagnosis not present

## 2018-06-11 NOTE — Telephone Encounter (Signed)
PT is in home at this time and states he used pulse ox and pt 02 sat dropped to 70's, then fluctuated into low 90's, he states pt denies short of breath, chest pain, dizziness, weakness, denies all resp distress. Pt does not appear to him to be struggling for breath, she is at rest at [employer] watching tv and chatting with him. A HHN will visit in appr 1 to 2 hrs and will assess pt resp status at that time. She will call to give an update

## 2018-06-12 ENCOUNTER — Emergency Department (HOSPITAL_COMMUNITY): Payer: Medicare Other

## 2018-06-12 ENCOUNTER — Observation Stay (HOSPITAL_COMMUNITY): Payer: Medicare Other

## 2018-06-12 ENCOUNTER — Other Ambulatory Visit: Payer: Self-pay

## 2018-06-12 ENCOUNTER — Inpatient Hospital Stay (HOSPITAL_COMMUNITY)
Admission: EM | Admit: 2018-06-12 | Discharge: 2018-06-20 | DRG: 291 | Disposition: A | Discharge status: Home Health | Payer: Medicare Other | Attending: Internal Medicine | Admitting: Internal Medicine

## 2018-06-12 ENCOUNTER — Encounter (HOSPITAL_COMMUNITY): Payer: Self-pay | Admitting: Emergency Medicine

## 2018-06-12 DIAGNOSIS — K5900 Constipation, unspecified: Secondary | ICD-10-CM | POA: Diagnosis present

## 2018-06-12 DIAGNOSIS — Z8719 Personal history of other diseases of the digestive system: Secondary | ICD-10-CM

## 2018-06-12 DIAGNOSIS — R0602 Shortness of breath: Secondary | ICD-10-CM | POA: Diagnosis not present

## 2018-06-12 DIAGNOSIS — I5043 Acute on chronic combined systolic (congestive) and diastolic (congestive) heart failure: Secondary | ICD-10-CM | POA: Diagnosis not present

## 2018-06-12 DIAGNOSIS — M7989 Other specified soft tissue disorders: Secondary | ICD-10-CM | POA: Diagnosis not present

## 2018-06-12 DIAGNOSIS — Z8679 Personal history of other diseases of the circulatory system: Secondary | ICD-10-CM

## 2018-06-12 DIAGNOSIS — F17201 Nicotine dependence, unspecified, in remission: Secondary | ICD-10-CM | POA: Diagnosis not present

## 2018-06-12 DIAGNOSIS — N179 Acute kidney failure, unspecified: Secondary | ICD-10-CM | POA: Diagnosis present

## 2018-06-12 DIAGNOSIS — Z885 Allergy status to narcotic agent status: Secondary | ICD-10-CM

## 2018-06-12 DIAGNOSIS — R52 Pain, unspecified: Secondary | ICD-10-CM | POA: Diagnosis not present

## 2018-06-12 DIAGNOSIS — Z9981 Dependence on supplemental oxygen: Secondary | ICD-10-CM

## 2018-06-12 DIAGNOSIS — E113299 Type 2 diabetes mellitus with mild nonproliferative diabetic retinopathy without macular edema, unspecified eye: Secondary | ICD-10-CM | POA: Diagnosis not present

## 2018-06-12 DIAGNOSIS — R609 Edema, unspecified: Secondary | ICD-10-CM

## 2018-06-12 DIAGNOSIS — J9611 Chronic respiratory failure with hypoxia: Secondary | ICD-10-CM | POA: Diagnosis not present

## 2018-06-12 DIAGNOSIS — N184 Chronic kidney disease, stage 4 (severe): Secondary | ICD-10-CM | POA: Diagnosis present

## 2018-06-12 DIAGNOSIS — R0600 Dyspnea, unspecified: Secondary | ICD-10-CM

## 2018-06-12 DIAGNOSIS — Z800 Family history of malignant neoplasm of digestive organs: Secondary | ICD-10-CM

## 2018-06-12 DIAGNOSIS — D8689 Sarcoidosis of other sites: Secondary | ICD-10-CM | POA: Diagnosis not present

## 2018-06-12 DIAGNOSIS — E1165 Type 2 diabetes mellitus with hyperglycemia: Secondary | ICD-10-CM | POA: Diagnosis present

## 2018-06-12 DIAGNOSIS — G4700 Insomnia, unspecified: Secondary | ICD-10-CM | POA: Diagnosis not present

## 2018-06-12 DIAGNOSIS — I5033 Acute on chronic diastolic (congestive) heart failure: Secondary | ICD-10-CM | POA: Diagnosis not present

## 2018-06-12 DIAGNOSIS — I509 Heart failure, unspecified: Secondary | ICD-10-CM

## 2018-06-12 DIAGNOSIS — D631 Anemia in chronic kidney disease: Secondary | ICD-10-CM | POA: Diagnosis present

## 2018-06-12 DIAGNOSIS — Z930 Tracheostomy status: Secondary | ICD-10-CM

## 2018-06-12 DIAGNOSIS — I428 Other cardiomyopathies: Secondary | ICD-10-CM | POA: Diagnosis present

## 2018-06-12 DIAGNOSIS — E1122 Type 2 diabetes mellitus with diabetic chronic kidney disease: Secondary | ICD-10-CM | POA: Diagnosis present

## 2018-06-12 DIAGNOSIS — R600 Localized edema: Secondary | ICD-10-CM

## 2018-06-12 DIAGNOSIS — N289 Disorder of kidney and ureter, unspecified: Secondary | ICD-10-CM | POA: Diagnosis not present

## 2018-06-12 DIAGNOSIS — Z820 Family history of epilepsy and other diseases of the nervous system: Secondary | ICD-10-CM

## 2018-06-12 DIAGNOSIS — I130 Hypertensive heart and chronic kidney disease with heart failure and stage 1 through stage 4 chronic kidney disease, or unspecified chronic kidney disease: Principal | ICD-10-CM | POA: Diagnosis present

## 2018-06-12 DIAGNOSIS — Z79899 Other long term (current) drug therapy: Secondary | ICD-10-CM

## 2018-06-12 DIAGNOSIS — R918 Other nonspecific abnormal finding of lung field: Secondary | ICD-10-CM | POA: Diagnosis not present

## 2018-06-12 DIAGNOSIS — Z794 Long term (current) use of insulin: Secondary | ICD-10-CM

## 2018-06-12 DIAGNOSIS — Z8614 Personal history of Methicillin resistant Staphylococcus aureus infection: Secondary | ICD-10-CM

## 2018-06-12 DIAGNOSIS — Z87891 Personal history of nicotine dependence: Secondary | ICD-10-CM

## 2018-06-12 DIAGNOSIS — Z8674 Personal history of sudden cardiac arrest: Secondary | ICD-10-CM

## 2018-06-12 DIAGNOSIS — E875 Hyperkalemia: Secondary | ICD-10-CM | POA: Diagnosis not present

## 2018-06-12 DIAGNOSIS — I110 Hypertensive heart disease with heart failure: Secondary | ICD-10-CM | POA: Diagnosis not present

## 2018-06-12 DIAGNOSIS — Z7952 Long term (current) use of systemic steroids: Secondary | ICD-10-CM

## 2018-06-12 DIAGNOSIS — D869 Sarcoidosis, unspecified: Secondary | ICD-10-CM | POA: Diagnosis present

## 2018-06-12 DIAGNOSIS — E119 Type 2 diabetes mellitus without complications: Secondary | ICD-10-CM

## 2018-06-12 DIAGNOSIS — R0989 Other specified symptoms and signs involving the circulatory and respiratory systems: Secondary | ICD-10-CM | POA: Diagnosis not present

## 2018-06-12 DIAGNOSIS — D649 Anemia, unspecified: Secondary | ICD-10-CM | POA: Diagnosis not present

## 2018-06-12 DIAGNOSIS — Z8249 Family history of ischemic heart disease and other diseases of the circulatory system: Secondary | ICD-10-CM

## 2018-06-12 DIAGNOSIS — Z6841 Body Mass Index (BMI) 40.0 and over, adult: Secondary | ICD-10-CM

## 2018-06-12 DIAGNOSIS — T79A22A Traumatic compartment syndrome of left lower extremity, initial encounter: Secondary | ICD-10-CM | POA: Diagnosis present

## 2018-06-12 DIAGNOSIS — E662 Morbid (severe) obesity with alveolar hypoventilation: Secondary | ICD-10-CM | POA: Diagnosis present

## 2018-06-12 DIAGNOSIS — I5032 Chronic diastolic (congestive) heart failure: Secondary | ICD-10-CM | POA: Diagnosis present

## 2018-06-12 DIAGNOSIS — Z8701 Personal history of pneumonia (recurrent): Secondary | ICD-10-CM

## 2018-06-12 DIAGNOSIS — Z825 Family history of asthma and other chronic lower respiratory diseases: Secondary | ICD-10-CM

## 2018-06-12 DIAGNOSIS — Z833 Family history of diabetes mellitus: Secondary | ICD-10-CM

## 2018-06-12 DIAGNOSIS — Z8711 Personal history of peptic ulcer disease: Secondary | ICD-10-CM

## 2018-06-12 DIAGNOSIS — I10 Essential (primary) hypertension: Secondary | ICD-10-CM | POA: Diagnosis present

## 2018-06-12 DIAGNOSIS — R0689 Other abnormalities of breathing: Secondary | ICD-10-CM | POA: Diagnosis not present

## 2018-06-12 DIAGNOSIS — N189 Chronic kidney disease, unspecified: Secondary | ICD-10-CM | POA: Diagnosis present

## 2018-06-12 HISTORY — DX: Dyspnea, unspecified: R06.00

## 2018-06-12 LAB — CBC
HCT: 27.4 % — ABNORMAL LOW (ref 36.0–46.0)
Hemoglobin: 8 g/dL — ABNORMAL LOW (ref 12.0–15.0)
MCH: 28.9 pg (ref 26.0–34.0)
MCHC: 29.2 g/dL — ABNORMAL LOW (ref 30.0–36.0)
MCV: 98.9 fL (ref 80.0–100.0)
Platelets: 246 10*3/uL (ref 150–400)
RBC: 2.77 MIL/uL — ABNORMAL LOW (ref 3.87–5.11)
RDW: 15.9 % — ABNORMAL HIGH (ref 11.5–15.5)
WBC: 8.2 10*3/uL (ref 4.0–10.5)
nRBC: 0 % (ref 0.0–0.2)

## 2018-06-12 LAB — TROPONIN I: Troponin I: <0.03 ng/mL (ref <0.03)

## 2018-06-12 LAB — BASIC METABOLIC PANEL
Anion gap: 9 (ref 5–15)
BUN: 21 mg/dL — ABNORMAL HIGH (ref 6–20)
CO2: 25 mmol/L (ref 22–32)
Calcium: 8.8 mg/dL — ABNORMAL LOW (ref 8.9–10.3)
Chloride: 105 mmol/L (ref 98–111)
Creatinine, Ser: 2.88 mg/dL — ABNORMAL HIGH (ref 0.44–1.00)
GFR calc Af Amer: 21 mL/min — ABNORMAL LOW (ref >60)
GFR calc non Af Amer: 18 mL/min — ABNORMAL LOW (ref >60)
Glucose, Bld: 130 mg/dL — ABNORMAL HIGH (ref 70–99)
Potassium: 4.6 mmol/L (ref 3.5–5.1)
Sodium: 139 mmol/L (ref 135–145)

## 2018-06-12 LAB — GLUCOSE, CAPILLARY
Glucose-Capillary: 311 mg/dL — ABNORMAL HIGH (ref 70–99)
Glucose-Capillary: 292 mg/dL — ABNORMAL HIGH (ref 70–99)

## 2018-06-12 LAB — I-STAT BETA HCG BLOOD, ED (MC, WL, AP ONLY): I-stat hCG, quantitative: <5 m[IU]/mL (ref <5)

## 2018-06-12 LAB — MAGNESIUM: Magnesium: 1.9 mg/dL (ref 1.7–2.4)

## 2018-06-12 LAB — CBG MONITORING, ED: Glucose-Capillary: 229 mg/dL — ABNORMAL HIGH (ref 70–99)

## 2018-06-12 LAB — BRAIN NATRIURETIC PEPTIDE: B Natriuretic Peptide: 459.1 pg/mL — ABNORMAL HIGH (ref 0.0–100.0)

## 2018-06-12 MED ORDER — CARVEDILOL 6.25 MG PO TABS
6.2500 mg | ORAL_TABLET | Freq: Two times a day (BID) | ORAL | Status: DC
  Administered 2018-06-12 – 2018-06-20 (×17): 6.25 mg via ORAL
  Filled 2018-06-12 (×17): qty 1

## 2018-06-12 MED ORDER — ACETAMINOPHEN 325 MG PO TABS: 650.0000 mg | ORAL_TABLET | Freq: Four times a day (QID) | ORAL | Status: DC | PRN

## 2018-06-12 MED ORDER — INSULIN ASPART 100 UNIT/ML ~~LOC~~ SOLN
0.0000 [IU] | Freq: Three times a day (TID) | SUBCUTANEOUS | Status: DC
  Administered 2018-06-12: 7 [IU] via SUBCUTANEOUS
  Administered 2018-06-13 (×3): 3 [IU] via SUBCUTANEOUS
  Administered 2018-06-14: 1 [IU] via SUBCUTANEOUS
  Administered 2018-06-14 – 2018-06-15 (×3): 2 [IU] via SUBCUTANEOUS
  Administered 2018-06-15: 1 [IU] via SUBCUTANEOUS
  Administered 2018-06-15: 2 [IU] via SUBCUTANEOUS
  Administered 2018-06-16 – 2018-06-20 (×3): 1 [IU] via SUBCUTANEOUS

## 2018-06-12 MED ORDER — INSULIN ASPART 100 UNIT/ML ~~LOC~~ SOLN
3.0000 [IU] | Freq: Three times a day (TID) | SUBCUTANEOUS | Status: DC
  Administered 2018-06-12 – 2018-06-13 (×3): 3 [IU] via SUBCUTANEOUS

## 2018-06-12 MED ORDER — HEPARIN SODIUM (PORCINE) 5000 UNIT/ML IJ SOLN
5000.0000 [IU] | Freq: Three times a day (TID) | INTRAMUSCULAR | Status: DC
  Administered 2018-06-12 – 2018-06-20 (×23): 5000 [IU] via SUBCUTANEOUS
  Filled 2018-06-12 (×23): qty 1

## 2018-06-12 MED ORDER — FUROSEMIDE 10 MG/ML IJ SOLN
40.0000 mg | Freq: Once | INTRAMUSCULAR | Status: AC
  Administered 2018-06-12: 40 mg via INTRAVENOUS
  Filled 2018-06-12: qty 4

## 2018-06-12 MED ORDER — FUROSEMIDE 10 MG/ML IJ SOLN
80.0000 mg | Freq: Once | INTRAMUSCULAR | Status: AC
  Administered 2018-06-12: 80 mg via INTRAVENOUS
  Filled 2018-06-12: qty 8

## 2018-06-12 MED ORDER — INSULIN DETEMIR 100 UNIT/ML ~~LOC~~ SOLN
30.0000 [IU] | Freq: Every day | SUBCUTANEOUS | Status: DC
  Administered 2018-06-12 – 2018-06-13 (×2): 30 [IU] via SUBCUTANEOUS
  Filled 2018-06-12 (×2): qty 0.3

## 2018-06-12 MED ORDER — INSULIN GLARGINE 100 UNIT/ML ~~LOC~~ SOLN
30.0000 [IU] | Freq: Every day | SUBCUTANEOUS | Status: DC
  Filled 2018-06-12: qty 0.3

## 2018-06-12 MED ORDER — MELATONIN 3 MG PO TABS
3.0000 mg | ORAL_TABLET | Freq: Every evening | ORAL | Status: DC | PRN
  Filled 2018-06-12: qty 1

## 2018-06-12 MED ORDER — PANTOPRAZOLE SODIUM 40 MG PO TBEC
40.0000 mg | DELAYED_RELEASE_TABLET | Freq: Two times a day (BID) | ORAL | Status: DC
  Administered 2018-06-12 – 2018-06-20 (×17): 40 mg via ORAL
  Filled 2018-06-12 (×17): qty 1

## 2018-06-12 MED ORDER — POLYETHYLENE GLYCOL 3350 17 G PO PACK: 17.0000 g | PACK | Freq: Every day | ORAL | Status: DC | PRN

## 2018-06-12 MED ORDER — IPRATROPIUM-ALBUTEROL 0.5-2.5 (3) MG/3ML IN SOLN
3.0000 mL | Freq: Once | RESPIRATORY_TRACT | Status: AC
  Administered 2018-06-12: 3 mL via RESPIRATORY_TRACT
  Filled 2018-06-12 (×2): qty 3

## 2018-06-12 MED ORDER — ACETAMINOPHEN 650 MG RE SUPP: 650.0000 mg | Freq: Four times a day (QID) | RECTAL | Status: DC | PRN

## 2018-06-12 MED ORDER — ATORVASTATIN CALCIUM 40 MG PO TABS
40.0000 mg | ORAL_TABLET | Freq: Every day | ORAL | Status: DC
  Administered 2018-06-12 – 2018-06-20 (×9): 40 mg via ORAL
  Filled 2018-06-12 (×9): qty 1

## 2018-06-12 NOTE — ED Notes (Signed)
Patient transported to X-ray 

## 2018-06-12 NOTE — ED Provider Notes (Signed)
[facility] EMERGENCY DEPARTMENT Provider Note   CSN: [id number] Arrival date & time: 06/12/18  0309     History   Chief Complaint Chief Complaint  Patient presents with  . Shortness of Breath    HPI Melody Gray is a 48 y.o. female.  The history is provided by the patient.  She has a complicated medical history including diabetes, hypertension, sarcoidosis, nonischemic cardiomyopathy with chronic diastolic heart failure, renal insufficiency, tracheostomy, and comes in with difficulty breathing for the last 2 days.  She has noted some pressure feeling in her upper abdomen which she thought was because she was constipated and she took laxative but has not noted any improvement.  There has been associated dyspnea.  She denies any cough.  She denies any chest discomfort, nausea, vomiting, diaphoresis.  She did receive an albuterol nebulizer treatment in the ED with some improvement.  She has albuterol inhaler at home, but the inhaler is empty.  Past Medical History:  Diagnosis Date  . Abnormal LFTs (liver function tests)    Liver U/S and exam c/w HSM. Hep B serology neg. but Hep C ab +, HIV neg. 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 Acute kidney injury (AKI) with acute tubular necrosis (ATN) (HCC)   . Acute on chronic respiratory failure with hypoxemia ([place]) 05/05/2014  . Acute renal failure superimposed on stage 4 chronic kidney disease ([place])   . Acute respiratory failure ([place])   . Acute respiratory failure with hypoxia ([place])   . AKI (acute kidney injury) ([place]) 05/10/2011  . Anemia   . Aspiration pneumonia ([place]) 11/22/2013  . Cardiac arrest ([place]) 05/03/2018  . Cardiomyopathy, nonischemic ([place])    a. Varying EF over the years - initially 35% in 09/2010. Normal cors 12/2010. b. Echo 07/2014: EF 55-60%, no RWMA, + diastolic flattening and systolic flattening c/w RV volume and pressure overload, mild LAE/RAE, mod  dilated RV, mod TR, PASP severely increased at 32mmHg. c. RHC 07/2014: moderate pulmonary HTN likely WHO group 3 with markedly elevated CVP and relatively normal left sided pressures.  . Chronic diastolic CHF (congestive heart failure) ([place])   . Chronic respiratory failure ([place])    a. became O2 dependent in July 2011. b. She required trache placement in 07/2013 and has been followed by pulmonology as well.  . CKD (chronic kidney disease), stage II   . Complication of anesthesia    " difficult waking "  . Diabetes mellitus    insulin dependent  . Diabetic retinopathy    Right eye 2/11  . Difficult airway   . Essential hypertension   . Health maintenance examination    Mammogram 05/2010 Negative; Last Pap smear 03/2008; Last DM eye exam 2/11> mild non-proliferative diabetic retinopathy. OD  . Helicobacter pylori ab+ 05/2011   Pt was symptomatic and treatment planned for 05/2011  . Hx of cardiac cath 2/08   No CAD, no RAS,  normal EF  . Hypoxemia    CT angiogram 9/11>> No PE; PFTs 10/11- FEV1 1.20 (49%) with 16% better p B2, DLCO 33%> corrects to 84; O2 sats ok on 4 lpm X rapid walk X 3 laps 05/2010  . Long term current use of systemic steroids   . Morbid obesity ([place])    Target wt= 153 for BMI <30  . MRSA bacteremia 05/06/2018  . Obesity hypoventilation syndrome ([place])   . Pulmonary hypertension ([place])   . Pulmonary  hypertension associated with sarcoidosis ([place]) 10/05/2010   Followed in Pulmonary clinic/ [facility]/ [place]   - repeat echo 10/14/10 LV much worse than RV so rx as L Ht failure by cards/[place] - CTa 07/25/13 ? Very small pe vs artifact on L > venous dopplers 07/28/2013 neg bilaterally  - Echo 08/10/13 The RV was dialted and severely hyponetic and there was a D-shaped septum suggestive of PAH with RV pressure volume overload.    . QT prolongation   . Sarcoidosis    Followed by Dr. Melvyn Novas; w/ liver involvement per biopsy 12/09, Reversible airway component so started on [facility] 01/2010;  HFA 75% p coaching 05/2010  . Seborrheic dermatitis of scalp   . Sleep apnea   . Tobacco abuse     Patient Active Problem List   Diagnosis Date Noted  . Left renal mass- 3.1cm  05/16/2018  . Esophageal candidiasis ([place]) 05/13/2018  . Gastric ulcer with hemorrhage   . Tobacco use disorder 05/12/2018  . History of upper gastrointestinal hemorrhage   . Acute blood loss anemia   . Chronic kidney disease (CKD), stage IV (severe) ([place])   . Morbid obesity ([place])   . Pneumonia 05/06/2018  . Nontraumatic compartment syndrome of leg   . Hyperkalemia 04/30/2018  . Chronic pulmonary hypertension ([place]) 04/30/2018  . Gallbladder polyp 05/07/2017  . Cirrhosis ([place]) 05/07/2017  . Chronic respiratory failure with hypoxia (HCC)/ trach dep 04/06/2017  . Osteoporosis screening 12/09/2015  . OSA (obstructive sleep apnea)   . Morbid obesity due to excess calories ([place]) complicated by OHS/ hbp 94/49/6759  . Tracheostomy dependent ([place])   . Cigarette smoker   . Obesity hypoventilation syndrome ([place])   . Cardiomyopathy, nonischemic ([place])   . Hypomagnesemia 09/15/2014  . Long term current use of systemic steroids 05/05/2014  . Health care maintenance 02/22/2014  . Sleep apnea 12/17/2013  . Adjustment disorder with depressed mood 11/13/2013  . Tracheostomy status ([place]) 10/28/2013  . Hyperlipidemia with target LDL less than 100 09/15/2013  . Smoker 07/25/2013  . ASCUS (atypical squamous cells of undetermined significance) on Pap smear 09/03/2012  . Arterial epistaxis 11/13/2011  . Left hip pain 08/02/2010  . Anemia in chronic renal disease 01/19/2010  . Mild nonproliferative diabetic retinopathy associated with type 2 diabetes mellitus ([place]) 06/16/2009  . Chronic diastolic heart failure, grade 2 04/28/2009  . NEPHROTIC SYNDROME 04/12/2009  . Body mass index (BMI) of 40.1-44.9 in adult ([place]) 04/06/2009  . GERD (gastroesophageal reflux disease) 06/12/2008  . Sarcoidosis ([place]) 05/22/2008  . Type 2  diabetes mellitus with mild nonproliferative retinopathy, with long-term current use of insulin ([place]) 02/10/2007  . Hypertension complicating diabetes ([place]) 2020-06-406  . History of prolonged Q-T interval on ECG 2020-06-406    Past Surgical History:  Procedure Laterality Date  . BIOPSY  05/13/2018   Procedure: BIOPSY;  Surgeon: [name], MD;  Location: [facility];  Service: Gastroenterology;;  . BREAST SURGERY    . CESAREAN SECTION    . ESOPHAGOGASTRODUODENOSCOPY (EGD) WITH PROPOFOL N/A 05/13/2018   Procedure: ESOPHAGOGASTRODUODENOSCOPY (EGD) WITH PROPOFOL;  Surgeon: [name], MD;  Location: [place] [facility];  Service: Gastroenterology;  Laterality: N/A;  . FASCIOTOMY Left 05/03/2018   Procedure: FOUR COMPARTMENT FASCIOTOMY, LEFT LOWER LEG;  Surgeon: [name], MD;  Location: [place];  Service: Vascular;  Laterality: Left;  . FASCIOTOMY CLOSURE Left 05/06/2018   Procedure: FASCIOTOMY CLOSURE OF LEFT LEG;  Surgeon: Marty Heck, MD;  Location: [facility];  Service: Vascular;  Laterality: Left;  . RIGHT HEART CATHETERIZATION N/A 08/12/2014   Procedure: RIGHT HEART CATH;  Surgeon: Jolaine Artist, MD;  Location: [facility] CATH LAB;  Service: Cardiovascular;  Laterality: N/A;  . TRACHEOSTOMY TUBE PLACEMENT N/A 08/10/2013   Procedure: TRACHEOSTOMY;  Surgeon: Melissa Montane, MD;  Location: [facility];  Service: ENT;  Laterality: N/A;  . TUBAL LIGATION       OB History   No obstetric history on file.      Home Medications    Prior to Admission medications   Medication Sig Start Date End Date Taking? Authorizing Provider  amLODipine (NORVASC) 10 MG tablet Take 1 tablet (10 mg total) by mouth daily. 01/22/18 01/22/19  Alphonzo Grieve, MD  atorvastatin (LIPITOR) 40 MG tablet Take 1 tablet (40 mg total) by mouth daily. 01/17/18   Neva Seat, MD  BD INSULIN SYRINGE U/F 30G X 1/2" 0.5 ML MISC USE AS DIRECTED 04/16/18   Neva Seat, MD  carvedilol (COREG) 6.25 MG tablet Take  1 tablet (6.25 mg total) by mouth 2 (two) times daily. 07/23/17   Lorella Nimrod, MD  glipiZIDE (GLUCOTROL) 5 MG tablet Take 0.5 tablets (2.5 mg total) by mouth daily before breakfast. 04/16/18   Neva Seat, MD  glucose blood test strip Use to check blood sugar 3 times daily. Dx code E11.65. Insulin dependent 05/25/18   Neva Seat, MD  insulin detemir (LEVEMIR) 100 UNIT/ML injection ADMINISTER 50 UNITS UNDER THE SKIN AT BEDTIME 04/16/18   Neva Seat, MD  Insulin Pen Needle (B-D UF III MINI PEN NEEDLES) 31G X 5 MM MISC USE TWICE A DAY 11/22/17   Neva Seat, MD  pantoprazole (PROTONIX) 40 MG tablet Take 1 tablet (40 mg total) by mouth 2 (two) times daily. 05/18/18 07/17/18  Ina Homes, MD  patiromer (VELTASSA) 8.4 g packet Take 1 packet (8.4 g total) by mouth daily for 30 days. 05/18/18 06/17/18  Ina Homes, MD  VICTOZA 18 MG/3ML SOPN INJECT 1.8 MILLIGRAM SUBCUTANEOUSLY DAILY 05/21/18   Neva Seat, MD    Family History Family History  Problem Relation Age of Onset  . Cancer Mother        colon cancer  . Multiple sclerosis Father   . Diabetes Father   . Asthma Sister        in childhood  . Hypertension Other     Social History Social History   Tobacco Use  . Smoking status: Former Smoker    Packs/day: 0.33    Years: 20.00    Pack years: 6.60    Types: Cigarettes    Start date: 05/17/2018  . Smokeless tobacco: Never Used  . Tobacco comment: quit Jan 2020  Substance Use Topics  . Alcohol use: No    Alcohol/week: 0.0 standard drinks  . Drug use: No     Allergies   Vicodin [hydrocodone-acetaminophen]   Review of Systems Review of Systems  All other systems reviewed and are negative.    Physical Exam Updated Vital Signs BP 128/77   Pulse 84   Temp 98.4 F (36.9 C) (Oral)   Resp (!) 23   Wt 95.7 kg   SpO2 100%   BMI 39.87 kg/m   Physical Exam Vitals signs and nursing note reviewed.    Morbidly obese 48 year old female,  resting comfortably and in no acute distress. Vital signs are significant for elevated respiratory rate. Oxygen saturation is 100%, which is normal. Head is normocephalic and atraumatic. PERRLA, EOMI. Oropharynx is clear. Neck is nontender  and supple without adenopathy or JVD.  Tracheostomy is present. Back is nontender and there is no CVA tenderness. Lungs have rales at the right base without wheezes or rhonchi. Chest is nontender. Heart has regular rate and rhythm without murmur. Abdomen is soft, flat, nontender without masses or hepatosplenomegaly and peristalsis is normoactive. Extremities: 2+ pretibial edema on the left leg with none on the right parenthesis leg has been swollen since she had fasciotomy done on that leg for compartment syndrome), full range of motion is present. Skin is warm and dry without rash. Neurologic: Mental status is normal, cranial nerves are intact, there are no motor or sensory deficits.  ED Treatments / Results  Labs (all labs ordered are listed, but only abnormal results are displayed) Labs Reviewed  BASIC METABOLIC PANEL - Abnormal; Notable for the following components:      Result Value   Glucose, Bld 130 (*)    BUN 21 (*)    Creatinine, Ser 2.88 (*)    Calcium 8.8 (*)    GFR calc non Af Amer 18 (*)    GFR calc Af Amer 21 (*)    All other components within normal limits  CBC - Abnormal; Notable for the following components:   RBC 2.77 (*)    Hemoglobin 8.0 (*)    HCT 27.4 (*)    MCHC 29.2 (*)    RDW 15.9 (*)    All other components within normal limits  TROPONIN I  MAGNESIUM  BRAIN NATRIURETIC PEPTIDE  I-STAT BETA HCG BLOOD, ED ([facility], [facility], AP ONLY)    EKG EKG Interpretation  Date/Time:  Wednesday June 12 2018 03:17:20 EST Ventricular Rate:  86 PR Interval:    QRS Duration: 91 QT Interval:  403 QTC Calculation: 482 R Axis:   -31 Text Interpretation:  Sinus rhythm Left axis deviation When compared with ECG of 05/04/2018, No significant  change was found Confirmed by Delora Fuel ([id number]) on 06/12/2018 3:22:07 AM   Radiology Dg Chest 2 View  Result Date: 06/12/2018 CLINICAL DATA:  Shortness of breath and wheezing EXAM: CHEST - 2 VIEW COMPARISON:  05/12/2018 FINDINGS: Cardiomegaly with main pulmonary artery enlargement. Tracheostomy tube is present and well seated. Indistinct opacities at both bases, also seen on recent priors. Artifact from EKG leads. IMPRESSION: 1. Chronic cardiomegaly and vascular congestion. 2. Indistinct parenchymal opacities at both bases that was also present on recent priors, atelectasis or infection could be present. Electronically Signed   By: Monte Fantasia M.D.   On: 06/12/2018 05:06    Procedures Procedures   Medications Ordered in ED Medications  ipratropium-albuterol (DUONEB) 0.5-2.5 (3) MG/3ML nebulizer solution 3 mL (3 mLs Nebulization Given 06/12/18 0443)     Initial Impression / Assessment and Plan / ED Course  I have reviewed the triage vital signs and the nursing notes.  Pertinent labs & imaging results that were available during my care of the patient were reviewed by me and considered in my medical decision making (see chart for details).  Dyspnea of uncertain cause and patient with history of asthma and heart failure.  She did have some subjective improvement with albuterol, so will give second nebulizer treatment with albuterol and ipratropium.  Will check chest x-ray and BNP.  Old records are reviewed showing hospitalization last month which included cardiac arrest.  Chest x-ray is unchanged from baseline.  Renal insufficiency is present which is worse than most recent value, but well within the range patient has been in recently.  Hemoglobin  is 8.0, but stable.  Troponin is normal.  BNP is pending.  Case is signed out to Dr. Maryan Rued.  Final Clinical Impressions(s) / ED Diagnoses   Final diagnoses:  Shortness of breath  Renal insufficiency  Normocytic anemia  Peripheral edema      ED Discharge Orders    None       Delora Fuel, MD 88/33/74 [phone]

## 2018-06-12 NOTE — ED Provider Notes (Signed)
Patient's BNP is elevated today to 459 with the last one being 80.  She complains of 3 days of worsening abdominal distention and discomfort with shortness of breath.  X-ray does show fluid and cardiomegaly.  Patient's renal function is better than it was in January but slowly creeping back up.  She is not currently on a diuretic but feels she needs diuresis.  However given her renal disease feel that it needs to be done cautiously to ensure no renal failure.  Patient was given IV Lasix here but will admit for close monitoring.   Blanchie Dessert, MD 06/12/18 [phone]

## 2018-06-12 NOTE — ED Notes (Signed)
Pt's sats 86% on 2L while sleeping, incr O2 to 4L while asleep. sats now 96%

## 2018-06-12 NOTE — ED Triage Notes (Addendum)
Pt in from home via [facility] with sob, wheezing and chest tightness since yesterday. Has trach, RA sats 96% on arrival, received 15mg  Albuterol, 1mg  Atrovent and 125mg  Solumedrol en route. Able to speak in short sentences. Denies fevers, n/v or cp. Hx of CHF and MI, notes incr BLE edema and stomach tightness, hx of ulcers

## 2018-06-12 NOTE — ED Notes (Addendum)
Sent a recollect for BNP Phoned lab to make them aware

## 2018-06-12 NOTE — Progress Notes (Signed)
Bilateral lower extremity venous duplex completed. Refer to "CV Proc" under chart review to view preliminary results.  06/12/2018 5:17 PM Maudry Mayhew, MHA, RVT, RDCS, RDMS

## 2018-06-12 NOTE — H&P (Addendum)
Date: 06/12/2018               Patient Name:  Melody Gray MRN: [id number]  DOB: Apr 20, 1971 Age / Sex: 48 y.o., female   PCP: Neva Seat, MD         Medical Service: Internal Medicine Teaching Service         Attending Physician: Dr. Annia Belt, MD    First Contact: Dr. Truman Hayward Pager: [phone]  Second Contact: Dr. Trilby Drummer Pager: [phone]       After Hours (After 5p/  First Contact Pager: [phone]  weekends / holidays): Second Contact Pager: [phone]   Chief Complaint: shortness of breath  History of Present Illness:  Ms. Kowalczyk is a 48yo female with PMH of sarcoidosis, OSA, obesity hypoventilation syndrome, trach dependent and oxygen dependent (2L), dCHF, HTN, T2DM, CKD stage 4, h/o gastric ulcer (likely stress due to ICU admission), h/o LLE compartment syndrome after prolonged cardiac arrest, and left kidney lesion presenting to [facility] [place] for dyspnea and swelling.  Patient states that for the past week, she has noticed trouble sleeping with worsening orthopnea, cough productive of white, frothy sputum, increased abdominal girth; yesterday she noted LE swelling on the right leg which was new for her, as well as dyspnea on exertion. She has ongoing LLE swelling after her fasciotomy for compartment syndrome which she states is unchanged; she denies significant pain in the LLE and has ongoing, unchanged numbness.  She states when [facility] RN checked on O2 sat yesterday, the highest saturation was 90 after increasing her O2 to maybe 3L (she is unsure); despite increasing oxygen, she still felt short of breath. When EMS came to her house, she was asked to ambulate to the ambulance and didn't have her oxygen on which made her very dyspneic. This improved after she was placed back on oxygen.  Patient denies chest pain, headache, vision or hearing changes, nausea, vomiting, diarrhea, constipation. She has been taking her medications regularly. She doesn't keep track of her fluid  intake - she drinks at least 32 ounces of water, 24 ounces of sparkling water, 1 cup of coffee; she tried to eat low-sodium foods but eats frozen meals most of the time and does no calculate her sodium intake.  Via EMS she was given atrovent, albuterol nebulizers and IV solumedrol with mild at best improvement. In the ED, she was hemodynamically stable, satting in the mid-high 90s on 3L [place]. CBC revealed Hgb of 8 (stable from priors), WBC of 8.2. Bmet reveals stable electrolytes and bicarb; Cr increased to 2.88 (from 2.38 at last check). EKG was unchanged from prior. CXR was poor quality but appeared to have pulm edema. She was given IV lasix.  Meds:  Current Meds  Medication Sig  . amLODipine (NORVASC) 10 MG tablet Take 1 tablet (10 mg total) by mouth daily.  Marland Kitchen atorvastatin (LIPITOR) 40 MG tablet Take 1 tablet (40 mg total) by mouth daily.  . carvedilol (COREG) 6.25 MG tablet Take 1 tablet (6.25 mg total) by mouth 2 (two) times daily.  Marland Kitchen glipiZIDE (GLUCOTROL) 5 MG tablet Take 0.5 tablets (2.5 mg total) by mouth daily before breakfast.  . insulin detemir (LEVEMIR) 100 UNIT/ML injection ADMINISTER 50 UNITS UNDER THE SKIN AT BEDTIME (Patient taking differently: Inject 46 Units into the skin at bedtime. ADMINISTER 50 UNITS UNDER THE SKIN AT BEDTIME)  . Melatonin 3 MG TABS Take 3 mg by mouth at bedtime as needed (sleep).  . OXYGEN Inhale 2 L  into the lungs continuous.  . pantoprazole (PROTONIX) 40 MG tablet Take 1 tablet (40 mg total) by mouth 2 (two) times daily.  Marland Kitchen VICTOZA 18 MG/3ML SOPN INJECT 1.8 MILLIGRAM SUBCUTANEOUSLY DAILY (Patient taking differently: Inject 1.8 mg into the skin daily. )    Allergies: Allergies as of 06/12/2018 - Review Complete 06/12/2018  Allergen Reaction Noted  . Vicodin [hydrocodone-acetaminophen] Itching 11/17/2010   Past Medical History:  Diagnosis Date  . Abnormal LFTs (liver function tests)    Liver U/S and exam c/w HSM. Hep B serology neg. but Hep C ab +, HIV  neg. 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 Acute kidney injury (AKI) with acute tubular necrosis (ATN) (HCC)   . Acute on chronic respiratory failure with hypoxemia ([place]) 05/05/2014  . Acute renal failure superimposed on stage 4 chronic kidney disease ([place])   . Acute respiratory failure ([place])   . Acute respiratory failure with hypoxia ([place])   . AKI (acute kidney injury) ([place]) 05/10/2011  . Anemia   . Aspiration pneumonia ([place]) 11/22/2013  . Cardiac arrest ([place]) 05/03/2018  . Cardiomyopathy, nonischemic ([place])    a. Varying EF over the years - initially 35% in 09/2010. Normal cors 12/2010. b. Echo 07/2014: EF 55-60%, no RWMA, + diastolic flattening and systolic flattening c/w RV volume and pressure overload, mild LAE/RAE, mod dilated RV, mod TR, PASP severely increased at 31mmHg. c. RHC 07/2014: moderate pulmonary HTN likely WHO group 3 with markedly elevated CVP and relatively normal left sided pressures.  . Chronic diastolic CHF (congestive heart failure) ([place])   . Chronic respiratory failure ([place])    a. became O2 dependent in July 2011. b. She required trache placement in 07/2013 and has been followed by pulmonology as well.  . CKD (chronic kidney disease), stage II   . Complication of anesthesia    " difficult waking "  . Diabetes mellitus    insulin dependent  . Diabetic retinopathy    Right eye 2/11  . Difficult airway   . Essential hypertension   . Health maintenance examination    Mammogram 05/2010 Negative; Last Pap smear 03/2008; Last DM eye exam 2/11> mild non-proliferative diabetic retinopathy. OD  . Helicobacter pylori ab+ 05/2011   Pt was symptomatic and treatment planned for 05/2011  . Hx of cardiac cath 2/08   No CAD, no RAS,  normal EF  . Hypoxemia    CT angiogram 9/11>> No PE; PFTs 10/11- FEV1 1.20 (49%) with 16% better p B2, DLCO 33%> corrects to 84; O2 sats ok on 4 lpm X rapid walk X 3 laps 05/2010  . Long term current use of  systemic steroids   . Morbid obesity ([place])    Target wt= 153 for BMI <30  . MRSA bacteremia 05/06/2018  . Obesity hypoventilation syndrome ([place])   . Pulmonary hypertension ([place])   . Pulmonary hypertension associated with sarcoidosis ([place]) 10/05/2010   Followed in Pulmonary clinic/ [facility]/ [place]   - repeat echo 10/14/10 LV much worse than RV so rx as L Ht failure by cards/[place] - CTa 07/25/13 ? Very small pe vs artifact on L > venous dopplers 07/28/2013 neg bilaterally  - Echo 08/10/13 The RV was dialted and severely hyponetic and there was a D-shaped septum suggestive of PAH with RV pressure volume overload.    . QT prolongation   . Sarcoidosis    Followed by Dr. Melvyn Novas; w/ liver involvement per  biopsy 12/09, Reversible airway component so started on [facility] 01/2010; HFA 75% p coaching 05/2010  . Seborrheic dermatitis of scalp   . Sleep apnea   . Tobacco abuse     Family History: Father with SM and DM; mother had colon cancer  Social History: Lives with boyfriend; recently quit smoking; denies etoh or illicit drug use.  Review of Systems: A complete ROS was negative except as per HPI.   Physical Exam: Blood pressure 138/71, pulse 87, temperature 97.8 F (36.6 C), temperature source Oral, resp. rate 18, weight 95.7 kg, SpO2 94 %. GENERAL- alert, co-operative, appears as stated age, not in any distress. HEENT- oral mucosa appears moist, difficult to evaluate JVD due to habitus CARDIAC- RRR, no murmurs, rubs or gallops. RESP- bibasilar crackles, no wheezing, no increased work of breathing. ABDOMEN- Soft, nontender, bowel sounds present. NEURO- No obvious Cr N abnormality. EXTREMITIES- pulse 2+ PT/DP, symmetric. Decreased sensation in LLE (unchanged) SKIN- Warm, dry, no rash or lesion. LLE with fasciotomy incisions which are well healed; mild LLE erythema compared to RLE. Bil LE pitting edema to knees R>L PSYCH- Normal mood and affect, appropriate thought content and speech.  EKG:  personally reviewed my interpretation is sinus rhythm, no ST or TW changes compared to prior.  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 CXR and exam suggestive of volume overload contributing to her dyspnea. She has no CP, had negative troponin and unchanged EKG so ischemia and PE are less likely to be contributing. There is still residual RLE swelling (recent fasciotomy for compartment syndrome) that is greater than the left LE - in setting of surgery and recent hospitalization there is concern for DVT; will evaluate. --IV lasix - received 40mg  in ED; team will assess urine output later today and redose if inadequate output --strict ins/outs, daily weights, fluid restriction --will obtain RLE doppler to evaluate for DVT --continue coreg 6.25mg  BID  Chronic hypoxic respiratory failure, on 2L [place], trach dependent Obesity hypoventilation syndrome OSA: Requiring 3L  at this time likely in setting of CHF exacerbation. Expect this to improve with diuresis. --continue supplemental oxygen  HTN: BP well controlled today. --continue coreg 6.25mg  BID --holding amlodipine for now while diuresing as BP is well controlled; can restart if needed  CKD stage 4: Cr slightly worsened from prior (2.4>2.8) in setting of volume overload. Bicarb stable. --diurese with IV lasix --f/u am bmet  T2DM: Patient on home regimen of victoza 1.8mg  daily, glipizide  2.5mg  daily and lantus 46 units qhs. --Lantus 30 units qhs, novolog 3 units TID wc +SSI - adjust as necessary based on CBGs  Diet: renal/CM 1.8L fluid restriction IVF: n/a VTE ppx: heparin sq Code: FULL - discussed on admission  Dispo: Admit patient to Observation with expected length of stay less than 2 midnights.  Signed: Alphonzo Grieve, MD 06/12/2018, 3:18 PM  [phone]

## 2018-06-13 ENCOUNTER — Inpatient Hospital Stay (HOSPITAL_COMMUNITY): Payer: Medicare Other

## 2018-06-13 DIAGNOSIS — I5033 Acute on chronic diastolic (congestive) heart failure: Secondary | ICD-10-CM | POA: Diagnosis not present

## 2018-06-13 DIAGNOSIS — E875 Hyperkalemia: Secondary | ICD-10-CM

## 2018-06-13 DIAGNOSIS — E1165 Type 2 diabetes mellitus with hyperglycemia: Secondary | ICD-10-CM | POA: Diagnosis present

## 2018-06-13 DIAGNOSIS — E1122 Type 2 diabetes mellitus with diabetic chronic kidney disease: Secondary | ICD-10-CM | POA: Diagnosis not present

## 2018-06-13 DIAGNOSIS — Z8614 Personal history of Methicillin resistant Staphylococcus aureus infection: Secondary | ICD-10-CM | POA: Diagnosis not present

## 2018-06-13 DIAGNOSIS — Z9981 Dependence on supplemental oxygen: Secondary | ICD-10-CM | POA: Diagnosis not present

## 2018-06-13 DIAGNOSIS — D631 Anemia in chronic kidney disease: Secondary | ICD-10-CM

## 2018-06-13 DIAGNOSIS — E113299 Type 2 diabetes mellitus with mild nonproliferative diabetic retinopathy without macular edema, unspecified eye: Secondary | ICD-10-CM | POA: Diagnosis not present

## 2018-06-13 DIAGNOSIS — Z833 Family history of diabetes mellitus: Secondary | ICD-10-CM | POA: Diagnosis not present

## 2018-06-13 DIAGNOSIS — N179 Acute kidney failure, unspecified: Secondary | ICD-10-CM | POA: Diagnosis not present

## 2018-06-13 DIAGNOSIS — Z6841 Body Mass Index (BMI) 40.0 and over, adult: Secondary | ICD-10-CM | POA: Diagnosis not present

## 2018-06-13 DIAGNOSIS — Z800 Family history of malignant neoplasm of digestive organs: Secondary | ICD-10-CM | POA: Diagnosis not present

## 2018-06-13 DIAGNOSIS — Z885 Allergy status to narcotic agent status: Secondary | ICD-10-CM | POA: Diagnosis not present

## 2018-06-13 DIAGNOSIS — I130 Hypertensive heart and chronic kidney disease with heart failure and stage 1 through stage 4 chronic kidney disease, or unspecified chronic kidney disease: Secondary | ICD-10-CM | POA: Diagnosis not present

## 2018-06-13 DIAGNOSIS — I5043 Acute on chronic combined systolic (congestive) and diastolic (congestive) heart failure: Secondary | ICD-10-CM | POA: Diagnosis not present

## 2018-06-13 DIAGNOSIS — N184 Chronic kidney disease, stage 4 (severe): Secondary | ICD-10-CM | POA: Diagnosis not present

## 2018-06-13 DIAGNOSIS — D8689 Sarcoidosis of other sites: Secondary | ICD-10-CM | POA: Diagnosis not present

## 2018-06-13 DIAGNOSIS — Z8249 Family history of ischemic heart disease and other diseases of the circulatory system: Secondary | ICD-10-CM | POA: Diagnosis not present

## 2018-06-13 DIAGNOSIS — I272 Pulmonary hypertension, unspecified: Secondary | ICD-10-CM | POA: Diagnosis not present

## 2018-06-13 DIAGNOSIS — Z79899 Other long term (current) drug therapy: Secondary | ICD-10-CM | POA: Diagnosis not present

## 2018-06-13 DIAGNOSIS — G4700 Insomnia, unspecified: Secondary | ICD-10-CM | POA: Diagnosis not present

## 2018-06-13 DIAGNOSIS — J9611 Chronic respiratory failure with hypoxia: Secondary | ICD-10-CM | POA: Diagnosis not present

## 2018-06-13 DIAGNOSIS — J9621 Acute and chronic respiratory failure with hypoxia: Secondary | ICD-10-CM | POA: Diagnosis not present

## 2018-06-13 DIAGNOSIS — Z794 Long term (current) use of insulin: Secondary | ICD-10-CM | POA: Diagnosis not present

## 2018-06-13 DIAGNOSIS — Z7952 Long term (current) use of systemic steroids: Secondary | ICD-10-CM | POA: Diagnosis not present

## 2018-06-13 DIAGNOSIS — R0602 Shortness of breath: Secondary | ICD-10-CM | POA: Diagnosis not present

## 2018-06-13 DIAGNOSIS — D869 Sarcoidosis, unspecified: Secondary | ICD-10-CM | POA: Diagnosis present

## 2018-06-13 DIAGNOSIS — Z4789 Encounter for other orthopedic aftercare: Secondary | ICD-10-CM | POA: Diagnosis not present

## 2018-06-13 DIAGNOSIS — E662 Morbid (severe) obesity with alveolar hypoventilation: Secondary | ICD-10-CM | POA: Diagnosis not present

## 2018-06-13 DIAGNOSIS — I428 Other cardiomyopathies: Secondary | ICD-10-CM | POA: Diagnosis not present

## 2018-06-13 DIAGNOSIS — K5900 Constipation, unspecified: Secondary | ICD-10-CM | POA: Diagnosis not present

## 2018-06-13 DIAGNOSIS — D860 Sarcoidosis of lung: Secondary | ICD-10-CM | POA: Diagnosis not present

## 2018-06-13 DIAGNOSIS — I5032 Chronic diastolic (congestive) heart failure: Secondary | ICD-10-CM | POA: Diagnosis not present

## 2018-06-13 DIAGNOSIS — Z930 Tracheostomy status: Secondary | ICD-10-CM | POA: Diagnosis not present

## 2018-06-13 DIAGNOSIS — D649 Anemia, unspecified: Secondary | ICD-10-CM | POA: Diagnosis not present

## 2018-06-13 DIAGNOSIS — E11319 Type 2 diabetes mellitus with unspecified diabetic retinopathy without macular edema: Secondary | ICD-10-CM | POA: Diagnosis not present

## 2018-06-13 DIAGNOSIS — N183 Chronic kidney disease, stage 3 (moderate): Secondary | ICD-10-CM | POA: Diagnosis not present

## 2018-06-13 LAB — POTASSIUM
Potassium: 6.4 mmol/L (ref 3.5–5.1)
Potassium: 5.7 mmol/L — ABNORMAL HIGH (ref 3.5–5.1)
Potassium: 5.3 mmol/L — ABNORMAL HIGH (ref 3.5–5.1)
Potassium: 5.4 mmol/L — ABNORMAL HIGH (ref 3.5–5.1)

## 2018-06-13 LAB — CBC WITH DIFFERENTIAL/PLATELET
Abs Immature Granulocytes: 0.29 10*3/uL — ABNORMAL HIGH (ref 0.00–0.07)
Basophils Absolute: 0 10*3/uL (ref 0.0–0.1)
Basophils Relative: 0 %
Eosinophils Absolute: 0 10*3/uL (ref 0.0–0.5)
Eosinophils Relative: 0 %
HCT: 25.1 % — ABNORMAL LOW (ref 36.0–46.0)
Hemoglobin: 7.3 g/dL — ABNORMAL LOW (ref 12.0–15.0)
Immature Granulocytes: 4 %
Lymphocytes Relative: 10 %
Lymphs Abs: 0.7 10*3/uL (ref 0.7–4.0)
MCH: 28.2 pg (ref 26.0–34.0)
MCHC: 29.1 g/dL — ABNORMAL LOW (ref 30.0–36.0)
MCV: 96.9 fL (ref 80.0–100.0)
Monocytes Absolute: 0.3 10*3/uL (ref 0.1–1.0)
Monocytes Relative: 4 %
Neutro Abs: 5.7 10*3/uL (ref 1.7–7.7)
Neutrophils Relative %: 82 %
Platelets: 230 10*3/uL (ref 150–400)
RBC: 2.59 MIL/uL — ABNORMAL LOW (ref 3.87–5.11)
RDW: 15.5 % (ref 11.5–15.5)
WBC: 6.9 10*3/uL (ref 4.0–10.5)
nRBC: 0.3 % — ABNORMAL HIGH (ref 0.0–0.2)

## 2018-06-13 LAB — BASIC METABOLIC PANEL
Anion gap: 9 (ref 5–15)
BUN: 40 mg/dL — ABNORMAL HIGH (ref 6–20)
CO2: 25 mmol/L (ref 22–32)
Calcium: 8.7 mg/dL — ABNORMAL LOW (ref 8.9–10.3)
Chloride: 103 mmol/L (ref 98–111)
Creatinine, Ser: 3.82 mg/dL — ABNORMAL HIGH (ref 0.44–1.00)
GFR calc Af Amer: 15 mL/min — ABNORMAL LOW (ref >60)
GFR calc non Af Amer: 13 mL/min — ABNORMAL LOW (ref >60)
Glucose, Bld: 273 mg/dL — ABNORMAL HIGH (ref 70–99)
Potassium: 6.4 mmol/L (ref 3.5–5.1)
Sodium: 137 mmol/L (ref 135–145)

## 2018-06-13 LAB — CBC
HCT: 25 % — ABNORMAL LOW (ref 36.0–46.0)
Hemoglobin: 7.2 g/dL — ABNORMAL LOW (ref 12.0–15.0)
MCH: 28.1 pg (ref 26.0–34.0)
MCHC: 28.8 g/dL — ABNORMAL LOW (ref 30.0–36.0)
MCV: 97.7 fL (ref 80.0–100.0)
Platelets: 223 10*3/uL (ref 150–400)
RBC: 2.56 MIL/uL — ABNORMAL LOW (ref 3.87–5.11)
RDW: 15.6 % — ABNORMAL HIGH (ref 11.5–15.5)
WBC: 6.9 10*3/uL (ref 4.0–10.5)
nRBC: 0.4 % — ABNORMAL HIGH (ref 0.0–0.2)

## 2018-06-13 LAB — GLUCOSE, CAPILLARY
Glucose-Capillary: 209 mg/dL — ABNORMAL HIGH (ref 70–99)
Glucose-Capillary: 222 mg/dL — ABNORMAL HIGH (ref 70–99)
Glucose-Capillary: 227 mg/dL — ABNORMAL HIGH (ref 70–99)
Glucose-Capillary: 228 mg/dL — ABNORMAL HIGH (ref 70–99)
Glucose-Capillary: 308 mg/dL — ABNORMAL HIGH (ref 70–99)

## 2018-06-13 LAB — SAVE SMEAR (SSMR)

## 2018-06-13 LAB — NA AND K (SODIUM & POTASSIUM), RAND UR
Potassium Urine: 25 mmol/L
Sodium, Ur: 71 mmol/L

## 2018-06-13 LAB — MAGNESIUM: Magnesium: 1.9 mg/dL (ref 1.7–2.4)

## 2018-06-13 MED ORDER — SODIUM ZIRCONIUM CYCLOSILICATE 5 G PO PACK
5.0000 g | PACK | Freq: Once | ORAL | Status: AC
  Administered 2018-06-13: 5 g via ORAL
  Filled 2018-06-13: qty 1

## 2018-06-13 MED ORDER — SODIUM ZIRCONIUM CYCLOSILICATE 10 G PO PACK
10.0000 g | PACK | Freq: Every day | ORAL | Status: DC
  Administered 2018-06-13 – 2018-06-17 (×5): 10 g via ORAL
  Filled 2018-06-13 (×5): qty 1

## 2018-06-13 MED ORDER — INSULIN ASPART 100 UNIT/ML ~~LOC~~ SOLN
6.0000 [IU] | Freq: Three times a day (TID) | SUBCUTANEOUS | Status: DC
  Administered 2018-06-13 – 2018-06-16 (×8): 6 [IU] via SUBCUTANEOUS

## 2018-06-13 MED ORDER — INSULIN ASPART 100 UNIT/ML IV SOLN
5.0000 [IU] | Freq: Once | INTRAVENOUS | Status: AC
  Administered 2018-06-13: 5 [IU] via INTRAVENOUS

## 2018-06-13 MED ORDER — SODIUM ZIRCONIUM CYCLOSILICATE 10 G PO PACK: 10.0000 g | PACK | Freq: Every day | ORAL | Status: DC

## 2018-06-13 MED ORDER — CALCIUM GLUCONATE-NACL 1-0.675 GM/50ML-% IV SOLN
1.0000 g | Freq: Once | INTRAVENOUS | Status: DC
  Filled 2018-06-13: qty 50

## 2018-06-13 MED ORDER — FUROSEMIDE 10 MG/ML IJ SOLN
40.0000 mg | Freq: Once | INTRAMUSCULAR | Status: AC
  Administered 2018-06-13: 40 mg via INTRAVENOUS
  Filled 2018-06-13: qty 4

## 2018-06-13 NOTE — Plan of Care (Signed)
  Problem: Clinical Measurements: Goal: Will remain free from infection Outcome: Progressing   Problem: Activity: Goal: Risk for activity intolerance will decrease Outcome: Progressing   Problem: Elimination: Goal: Will not experience complications related to bowel motility Outcome: Progressing   Problem: Safety: Goal: Ability to remain free from injury will improve Outcome: Progressing   

## 2018-06-13 NOTE — Progress Notes (Addendum)
CRITICAL VALUE ALERT  Critical Value:  Potassium 6.4  Date & Time Notied:  06/13/18 7858  Provider Notified: Dr Alain Marion   Orders Received/Actions taken: Stat EKG, 40mg  IV lasix. Calcium gluconate ivpb.  Orders initiated. Will continue to monitor patient.

## 2018-06-13 NOTE — Progress Notes (Signed)
Subjective:  Melody Gray is a 48 y.o. with PMH of HFpEF, trach-dependent OSH, HTN, CKD4, T2DM, and chronic respiratory failure on 2L O2 [place] admit for acute on chronic respiratory failure on hospital day 0  Melody Gray was examined and evaluated at bedside this AM. She states she feels well with minor complaints of dyspnea overnight requiring increasing her oxygen to 3L but has been able to titrate down back to 2L by morning. She denies any significant chest pain, palpitations, productive cough, fevers or chills. She denies any significant changes in her orthopnea or lower extremity swelling.  Objective:  Vital signs in last 24 hours: Vitals:   06/13/18 0012 06/13/18 0522 06/13/18 0919 06/13/18 1017  BP: 121/63 (!) 149/75  129/68  Pulse: 81 80 87 84  Resp: 18 18 18    Temp: 98.6 F (37 C) 98.4 F (36.9 C)  98.3 F (36.8 C)  TempSrc: Oral Oral  Oral  SpO2: 97% 93% 96% 90%  Weight:  97.3 kg    Height:       Physical Exam  Constitutional:  Obese, chronically ill-appearing  HENT:  Mouth/Throat: Oropharynx is clear and moist.  Eyes: Conjunctivae are normal.  Neck: Normal range of motion. Neck supple.  Cardiovascular: Normal rate, regular rhythm, normal heart sounds and intact distal pulses.  No murmur heard. Pulmonary/Chest: Effort normal. She has rales (bilateral basilar rales L>R).  Abdominal: Soft. Bowel sounds are normal. She exhibits no distension.  Musculoskeletal: Normal range of motion.        General: Edema (unilateral right lower leg 2+ edema up to upper shin) present.  Skin: Skin is warm and dry.  Surgical fasciotomy scars on right lower leg healing well without purulent exudate, warmth or significant surrounding tenderness.   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 Her chest X-ray showed pleural effusions thought to be due to her heart failure. Her most recent Echo (05/07/18) showed 60-65% EF w/ grade 1 diastolic dysfunction but had poor visualization of right sided heart due to body habitus. She was found to have hyperkalemia this morning despite receiving insulin and furosemide overnight. Unclear etiology but currently stable EKG. Will treat and monitor.  Hyperkalemia 2/2 acute on chronic renal failure K 4.6 -> 6.4 this am. BUN 40, Creatinine 3.82. Unclear etiology for acute renal failure. EKG re-assuring with no peaked T waves or widened QRS. Currently on insulin regimen for diabetes. Received 30 units long acting last night. - Trend potassium q4hrs - Start Lokelma 10g daily - C/w diabetic reigmen: Levemir 30 units qhs, novolog 3units TID qc, SSI - Hold nephrotoxic meds  Dyspnea 2/2 acute on chronic HFpEF vs acute on chronic OHS driven respiratory failure  RLE pitting edema. Chest X-ray show left sided pleural effusion. 600cc output overnight with IV furosemide. Weight trending up 95.7->97.3kg. Lower extremity ultrasound negative for DVT - Holding furosemide in setting of worsening renal function -  Strict I&Os - Daily weights - C/w coreg 6.25mg  BID - Replete Mag as needed - Keep O2 sat >88  Normocytic Anemia 2/2 anemia of chronic disease vs hemolysis Baseline hemoglobin 11. Trended down and stayed stable around 8.0 during last prolonged admission in January. Drop from 8.0->7.3 this am. Borderline normocytic/macrocytic.  - Trend CBC - Will get CMP to check  bili  T2DM Fasting am 227, consistently hyperglycemic - Mealtime coverage increase to 6 units TID qc - Increase Lantus to 32 units daily - Glucose checks  - Sliding scale insulin  DVT prophx: subqheparin Diet: Renal/Diabetic w/ fluid restrict Bowel: miralax Code: Full  Dispo: Anticipated discharge in approximately 2 day(s).   [name], MD 06/13/2018, 11:33 AM Pager: [phone]

## 2018-06-13 NOTE — Progress Notes (Signed)
Medicine attending: I examined this patient today together with resident physician Dr. Gilberto Better and I concur with his evaluation and management plan.  Still with intermittent dyspnea.  Oxygen transiently increased from baseline 2 L to 3 L during the night now back at 2 L.  Chronic trach collar.  Regular cardiac rhythm.  No JVD.  No right lower extremity edema on my exam.  Persistent 1-2+ edema on the left side related to bilateral calf fasciotomies from recent compartment syndrome.  Minimal bibasilar rales. Abrupt deterioration in her renal function.  BUN 21, creatinine 2.9 on admission February 12.  BUN 40, creatinine 3.8 today.  Potassium 6.4.  Reproducible so not a lab error.  She suffered acute renal injury at time of a prolonged resuscitation for cardiac arrest.  Peak creatinine 5.0 January 5.  Improvement with creatinine down to 2.4 by January 30.  Creatinine already elevated above this baseline yesterday at 2.9.  Not clear whether there is a prerenal component to the acute deterioration in her renal function related to current diuretic therapy given disproportionate rise of the BUN. She has had a fall in her hemoglobin from admission value but this may also elect progressive renal dysfunction.  She would be an appropriate person to receive an ESA. We will explore other potential etiologies for acute deterioration.  Renal ultrasound.  Hold diuretics.  Continue to monitor closely.

## 2018-06-13 NOTE — Care Management Note (Signed)
Case Management Note  Patient Details  Name: Melody Gray MRN: [id number] Date of Birth: [birth year]  Subjective/Objective:     Shortness of Breath, CHF              Action/Plan: Patient lives at home with spouse; PCP is Neva Seat, MD; has private insurance with Medicare; is active with [facility] for [facility] services as prior to admission/ [place]; DME- [place] and home oxygen through [place];   Expected Discharge Date:   possibly 06/17/2018               Expected Discharge Plan:  [place]  Discharge planning Services  CM Consult  HH Arranged:  RN, PT, OT [facility] Agency:  [place]  Status of Service:  In process, will continue to follow  Sherrilyn Rist [phone] 06/13/2018, 3:12 PM

## 2018-06-14 ENCOUNTER — Ambulatory Visit: Payer: Medicare Other | Admitting: Dietician

## 2018-06-14 DIAGNOSIS — Z9889 Other specified postprocedural states: Secondary | ICD-10-CM

## 2018-06-14 LAB — SODIUM, URINE, RANDOM: Sodium, Ur: 33 mmol/L

## 2018-06-14 LAB — CBC
HCT: 24.1 % — ABNORMAL LOW (ref 36.0–46.0)
HCT: 28.9 % — ABNORMAL LOW (ref 36.0–46.0)
Hemoglobin: 6.9 g/dL — CL (ref 12.0–15.0)
Hemoglobin: 8.8 g/dL — ABNORMAL LOW (ref 12.0–15.0)
MCH: 27.4 pg (ref 26.0–34.0)
MCH: 29.1 pg (ref 26.0–34.0)
MCHC: 28.6 g/dL — ABNORMAL LOW (ref 30.0–36.0)
MCHC: 30.4 g/dL (ref 30.0–36.0)
MCV: 95.6 fL (ref 80.0–100.0)
MCV: 95.7 fL (ref 80.0–100.0)
Platelets: 212 10*3/uL (ref 150–400)
Platelets: 236 10*3/uL (ref 150–400)
RBC: 2.52 MIL/uL — ABNORMAL LOW (ref 3.87–5.11)
RBC: 3.02 MIL/uL — ABNORMAL LOW (ref 3.87–5.11)
RDW: 15.5 % (ref 11.5–15.5)
RDW: 15.6 % — ABNORMAL HIGH (ref 11.5–15.5)
WBC: 8.7 10*3/uL (ref 4.0–10.5)
WBC: 8.6 10*3/uL (ref 4.0–10.5)
nRBC: 0.5 % — ABNORMAL HIGH (ref 0.0–0.2)
nRBC: 0.3 % — ABNORMAL HIGH (ref 0.0–0.2)

## 2018-06-14 LAB — COMPREHENSIVE METABOLIC PANEL
ALT: 35 U/L (ref 0–44)
AST: 43 U/L — ABNORMAL HIGH (ref 15–41)
Albumin: 2.8 g/dL — ABNORMAL LOW (ref 3.5–5.0)
Alkaline Phosphatase: 187 U/L — ABNORMAL HIGH (ref 38–126)
Anion gap: 7 (ref 5–15)
BUN: 52 mg/dL — ABNORMAL HIGH (ref 6–20)
CO2: 25 mmol/L (ref 22–32)
Calcium: 8.3 mg/dL — ABNORMAL LOW (ref 8.9–10.3)
Chloride: 104 mmol/L (ref 98–111)
Creatinine, Ser: 4.27 mg/dL — ABNORMAL HIGH (ref 0.44–1.00)
GFR calc Af Amer: 13 mL/min — ABNORMAL LOW (ref >60)
GFR calc non Af Amer: 11 mL/min — ABNORMAL LOW (ref >60)
Glucose, Bld: 279 mg/dL — ABNORMAL HIGH (ref 70–99)
Potassium: 5.3 mmol/L — ABNORMAL HIGH (ref 3.5–5.1)
Sodium: 136 mmol/L (ref 135–145)
Total Bilirubin: 0.3 mg/dL (ref 0.3–1.2)
Total Protein: 6.6 g/dL (ref 6.5–8.1)

## 2018-06-14 LAB — GLUCOSE, CAPILLARY
Glucose-Capillary: 162 mg/dL — ABNORMAL HIGH (ref 70–99)
Glucose-Capillary: 126 mg/dL — ABNORMAL HIGH (ref 70–99)
Glucose-Capillary: 157 mg/dL — ABNORMAL HIGH (ref 70–99)
Glucose-Capillary: 316 mg/dL — ABNORMAL HIGH (ref 70–99)

## 2018-06-14 LAB — POTASSIUM
Potassium: 5.2 mmol/L — ABNORMAL HIGH (ref 3.5–5.1)
Potassium: 4.5 mmol/L (ref 3.5–5.1)

## 2018-06-14 LAB — CREATININE, URINE, RANDOM: Creatinine, Urine: 120.3 mg/dL

## 2018-06-14 LAB — PREPARE RBC (CROSSMATCH)

## 2018-06-14 MED ORDER — INSULIN DETEMIR 100 UNIT/ML ~~LOC~~ SOLN
30.0000 [IU] | Freq: Every day | SUBCUTANEOUS | Status: DC
  Administered 2018-06-14 – 2018-06-19 (×6): 30 [IU] via SUBCUTANEOUS
  Filled 2018-06-14 (×7): qty 0.3

## 2018-06-14 MED ORDER — SODIUM CHLORIDE 0.9% IV SOLUTION
Freq: Once | INTRAVENOUS | Status: AC
  Administered 2018-06-14: 02:00:00 via INTRAVENOUS

## 2018-06-14 MED ORDER — INSULIN DETEMIR 100 UNIT/ML ~~LOC~~ SOLN: 33.0000 [IU] | Freq: Every day | SUBCUTANEOUS | Status: DC

## 2018-06-14 NOTE — Consult Note (Signed)
   [facility] CM Inpatient Consult   06/14/2018  Roshan Salamon Bulman Mar 10, 1971 [id number]  Patient assessed for high risk score and hospitalizations to check if potential [facility] services are needed for restart . Primary Care Provider is  [facility] and endorsed by patient.  This office is listed to provide the transition of care follow up. Chart review per MD notes reveals patient is a:  48 year old woman well-known to our service.  She has she has sarcoidosis with lung and liver involvement, biopsy-proven.  She has obesity sleep apnea syndrome.  She has chronic hypoxic respiratory failure.  She is oxygen dependent.  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  She has type 2 insulin-dependent diabetes, hypertension, chronic grade 2 diastolic heart dysfunction admitted with acute on chronic HF. Met with the patient at the bedside.  Patient states she is doing well managing at home.   Pharmacy is:  Estate manager/land agent to provider: She gets rides from family and friends.  Does need to contact [employer] and Medical transportation if needs arise.  She states she hasn't had to use it. Patient could benefit from EMMI follow up.   For questions contact:   Natividad Brood, RN BSN [facility] Liaison  [phone] business mobile phone Toll free office [phone]

## 2018-06-14 NOTE — Progress Notes (Signed)
Medicine attending: I examined this patient today together with resident physician Dr. Gilberto Better and I concur with his evaluation and management plan. We appreciate prompt nephrology assistance.  Recommendations pending. Respiratory status has improved but renal function continues to deteriorate.  BUN 52.  Creatinine 4.3.  Potassium 5.3.  No signs of acidemia with bicarbonate 25, anion gap 7. Renal ultrasound with no signs of obstruction. Urine output 1 L yesterday.  Holding diuretics. Concomitant fall in hemoglobin down to 6.9 with rising creatinine.  1 unit of blood will be given today. 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 She presented with mild increase in her dyspnea and peripheral edema.  Elevated BNP with no baseline.  Initial attempt at diuresis but after only 1 or 2 doses of furosemide there was  a rapid decline in her renal function with rising BUN disproportionate to creatinine suggesting an element of volume depletion.  She has received no IV contrast.  No nephrotoxic drugs.  No evidence for an obstructive process. We are reluctant to push diuretics further at this time.  Awaiting nephrology opinion on management.

## 2018-06-14 NOTE — Progress Notes (Signed)
Patient did not want RCP to change anything or suction. No distress noted will continue to monitor.

## 2018-06-14 NOTE — Consult Note (Signed)
[facility]  Consult Note  Requesting MD: Dr. Beryle Beams Reason for consult: AKI on CKD  HPI:  48yo AAF with sarcoidosis, OSA, chronic resp failure with trach, DM, HFpEF, obesity who is currently admitted since 06/12/2018 after presenting with dyspnea.  Nephrology is consulted for AKI on CKD.  She had a recent admission 1/31-1/18 with PNA, respiratory failure, cardiac arrest, gastric ulcer, candida esophagitis, MRSA bacteremia, req'd fasciotomy LLE for compartment syndrome.  Her creatinine during that admission worsened from baseline 2.4 to 5 but improved to 2.4 by 05/30/18.  Of note 05/07/2018 TTE EF normal, grade 1 dd.  She was back home for about 2 weeks but presented 2/12 with a few day history of dyspnea and reported increased LE edema.  She endorsed productive cough and f/c at home.  Weight was stable compared to documented weight 2 weeks prior.  CXR poor study and inconclusive.  She rec'd lasix 40 then 80 IV 2/12 and lasix 40 2/13 AM.  Net I/Os for the admission are -0.5.  UOP 600 > 1000 > 300 so far today.  No contrast, NSAIDs.  Aside from BP on presentation 107/62, she's been normotensive during admission.  She remains on 2L [place], has at home.  She tells me she feels a bit better than when she presented.   She tries to eat low Na but has been eating some [employer] dinners due to convenience.   Cr trended 2.88 > 3.8 > 4.27.  Documented wt trend 2/12 213.3 > 2/13 214.4 > 2/14 216.9.   PMH: Past Medical History:  Diagnosis Date  . Abnormal LFTs (liver function tests)    Liver U/S and exam c/w HSM. Hep B serology neg. but Hep C ab +, HIV neg. 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 Acute kidney injury (AKI) with acute tubular necrosis (ATN) (HCC)   . Acute on chronic respiratory failure with hypoxemia ([place]) 05/05/2014  . Acute renal failure superimposed on stage 4 chronic kidney disease ([place])   . Acute respiratory failure  ([place])   . Acute respiratory failure with hypoxia ([place])   . AKI (acute kidney injury) ([place]) 05/10/2011  . Anemia   . Aspiration pneumonia ([place]) 11/22/2013  . Cardiac arrest ([place]) 05/03/2018  . Cardiomyopathy, nonischemic ([place])    a. Varying EF over the years - initially 35% in 09/2010. Normal cors 12/2010. b. Echo 07/2014: EF 55-60%, no RWMA, + diastolic flattening and systolic flattening c/w RV volume and pressure overload, mild LAE/RAE, mod dilated RV, mod TR, PASP severely increased at 15mmHg. c. RHC 07/2014: moderate pulmonary HTN likely WHO group 3 with markedly elevated CVP and relatively normal left sided pressures.  . Chronic diastolic CHF (congestive heart failure) ([place])   . Chronic respiratory failure ([place])    a. became O2 dependent in July 2011. b. She required trache placement in 07/2013 and has been followed by pulmonology as well.  . CKD (chronic kidney disease), stage II   . Complication of anesthesia    " difficult waking "  . Diabetes mellitus    insulin dependent  . Diabetic retinopathy    Right eye 2/11  . Difficult airway   . Dyspnea   . Essential hypertension   . Health maintenance examination    Mammogram 05/2010 Negative; Last Pap smear 03/2008; Last DM eye exam 2/11> mild non-proliferative diabetic retinopathy. OD  . Helicobacter pylori ab+ 05/2011   Pt was symptomatic and  treatment planned for 05/2011  . Hx of cardiac cath 2/08   No CAD, no RAS,  normal EF  . Hypoxemia    CT angiogram 9/11>> No PE; PFTs 10/11- FEV1 1.20 (49%) with 16% better p B2, DLCO 33%> corrects to 84; O2 sats ok on 4 lpm X rapid walk X 3 laps 05/2010  . Long term current use of systemic steroids   . Morbid obesity ([place])    Target wt= 153 for BMI <30  . MRSA bacteremia 05/06/2018  . Obesity hypoventilation syndrome ([place])   . Pulmonary hypertension ([place])   . Pulmonary hypertension associated with sarcoidosis ([place]) 10/05/2010   Followed in Pulmonary clinic/ [facility]/ [place]   - repeat echo 10/14/10  LV much worse than RV so rx as L Ht failure by cards/[place] - CTa 07/25/13 ? Very small pe vs artifact on L > venous dopplers 07/28/2013 neg bilaterally  - Echo 08/10/13 The RV was dialted and severely hyponetic and there was a D-shaped septum suggestive of PAH with RV pressure volume overload.    . QT prolongation   . Sarcoidosis    Followed by Dr. Melvyn Novas; w/ liver involvement per biopsy 12/09, Reversible airway component so started on [facility] 01/2010; HFA 75% p coaching 05/2010  . Seborrheic dermatitis of scalp   . Sleep apnea   . Tobacco abuse    PSH: Past Surgical History:  Procedure Laterality Date  . BIOPSY  05/13/2018   Procedure: BIOPSY;  Surgeon: [name], MD;  Location: [facility];  Service: Gastroenterology;;  . BREAST SURGERY    . CESAREAN SECTION    . ESOPHAGOGASTRODUODENOSCOPY (EGD) WITH PROPOFOL N/A 05/13/2018   Procedure: ESOPHAGOGASTRODUODENOSCOPY (EGD) WITH PROPOFOL;  Surgeon: [name], MD;  Location: [facility];  Service: Gastroenterology;  Laterality: N/A;  . FASCIOTOMY Left 05/03/2018   Procedure: FOUR COMPARTMENT FASCIOTOMY, LEFT LOWER LEG;  Surgeon: [name], MD;  Location: [facility];  Service: Vascular;  Laterality: Left;  . FASCIOTOMY CLOSURE Left 05/06/2018   Procedure: FASCIOTOMY CLOSURE OF LEFT LEG;  Surgeon: Marty Heck, MD;  Location: [facility];  Service: Vascular;  Laterality: Left;  . RIGHT HEART CATHETERIZATION N/A 08/12/2014   Procedure: RIGHT HEART CATH;  Surgeon: Jolaine Artist, MD;  Location: [facility];  Service: Cardiovascular;  Laterality: N/A;  . TRACHEOSTOMY TUBE PLACEMENT N/A 08/10/2013   Procedure: TRACHEOSTOMY;  Surgeon: Melissa Montane, MD;  Location: [facility];  Service: ENT;  Laterality: N/A;  . TUBAL LIGATION      Medications:  I have reviewed the patient's current medications.  Medications Prior to Admission  Medication Sig Dispense Refill  . amLODipine (NORVASC) 10 MG tablet Take 1 tablet (10 mg total) by mouth  daily. 30 tablet 11  . atorvastatin (LIPITOR) 40 MG tablet Take 1 tablet (40 mg total) by mouth daily. 30 tablet 11  . carvedilol (COREG) 6.25 MG tablet Take 1 tablet (6.25 mg total) by mouth 2 (two) times daily. 180 tablet 2  . glipiZIDE (GLUCOTROL) 5 MG tablet Take 0.5 tablets (2.5 mg total) by mouth daily before breakfast. 30 tablet 1  . insulin detemir (LEVEMIR) 100 UNIT/ML injection ADMINISTER 50 UNITS UNDER THE SKIN AT BEDTIME (Patient taking differently: Inject 46 Units into the skin at bedtime. ADMINISTER 50 UNITS UNDER THE SKIN AT BEDTIME) 40 mL 1  . Melatonin 3 MG TABS Take 3 mg by mouth at bedtime as needed (sleep).    . OXYGEN Inhale 2 L into the lungs  continuous.    . pantoprazole (PROTONIX) 40 MG tablet Take 1 tablet (40 mg total) by mouth 2 (two) times daily. 60 tablet 1  . VICTOZA 18 MG/3ML SOPN INJECT 1.8 MILLIGRAM SUBCUTANEOUSLY DAILY (Patient taking differently: Inject 1.8 mg into the skin daily. ) 9 mL 11  . BD INSULIN SYRINGE U/F 30G X 1/2" 0.5 ML MISC USE AS DIRECTED 200 each 1  . glucose blood test strip Use to check blood sugar 3 times daily. Dx code E11.65. Insulin dependent 100 each 11  . Insulin Pen Needle (B-D UF III MINI PEN NEEDLES) 31G X 5 MM MISC USE TWICE A DAY 200 each 0  . patiromer (VELTASSA) 8.4 g packet Take 1 packet (8.4 g total) by mouth daily for 30 days. (Patient not taking: Reported on 06/12/2018) 30 packet 0    ALLERGIES:   Allergies  Allergen Reactions  . Vicodin [Hydrocodone-Acetaminophen] Itching    FAM HX: Family History  Problem Relation Age of Onset  . Cancer Mother        colon cancer  . Multiple sclerosis Father   . Diabetes Father   . Asthma Sister        in childhood  . Hypertension Other     Social History:   reports that she has quit smoking. Her smoking use included cigarettes. She started smoking about 4 weeks ago. She has a 6.60 pack-year smoking history. She has never used smokeless tobacco. She reports that she does not  drink alcohol or use drugs.  ROS: ROS  Blood pressure (!) 144/83, pulse 81, temperature 97.9 F (36.6 C), temperature source Oral, resp. rate 18, height 5\' 1"  (1.549 m), weight 98.4 kg, SpO2 99 %. PHYSICAL EXAM: Physical Exam   Results for orders placed or performed during the hospital encounter of 06/12/18 (from the past 48 hour(s))  Glucose, capillary     Status: Abnormal   Collection Time: 06/12/18  9:16 PM  Result Value Ref Range   Glucose-Capillary 292 (H) 70 - 99 mg/dL  Basic metabolic panel     Status: Abnormal   Collection Time: 06/13/18  5:16 AM  Result Value Ref Range   Sodium 137 135 - 145 mmol/L   Potassium 6.4 (HH) 3.5 - 5.1 mmol/L    Comment: NO VISIBLE HEMOLYSIS CRITICAL RESULT CALLED TO, READ BACK BY AND VERIFIED WITH: DODOO E,RN 06/13/18 0617 WAYK    Chloride 103 98 - 111 mmol/L   CO2 25 22 - 32 mmol/L   Glucose, Bld 273 (H) 70 - 99 mg/dL   BUN 40 (H) 6 - 20 mg/dL   Creatinine, Ser 3.82 (H) 0.44 - 1.00 mg/dL   Calcium 8.7 (L) 8.9 - 10.3 mg/dL   GFR calc non Af Amer 13 (L) >60 mL/min   GFR calc Af Amer 15 (L) >60 mL/min   Anion gap 9 5 - 15    Comment: Performed at [facility], [address], [place], [place] [address]  CBC     Status: Abnormal   Collection Time: 06/13/18  5:16 AM  Result Value Ref Range   WBC 6.9 4.0 - 10.5 K/uL   RBC 2.56 (L) 3.87 - 5.11 MIL/uL   Hemoglobin 7.2 (L) 12.0 - 15.0 g/dL   HCT 25.0 (L) 36.0 - 46.0 %   MCV 97.7 80.0 - 100.0 fL   MCH 28.1 26.0 - 34.0 pg   MCHC 28.8 (L) 30.0 - 36.0 g/dL   RDW 15.6 (H) 11.5 - 15.5 %  Platelets 223 150 - 400 K/uL   nRBC 0.4 (H) 0.0 - 0.2 %    Comment: Performed at [facility], [place] [address] [place], [place] [address]  Magnesium     Status: None   Collection Time: 06/13/18  5:16 AM  Result Value Ref Range   Magnesium 1.9 1.7 - 2.4 mg/dL    Comment: Performed at [facility] [address], [place], [place] [address]  CBC with Differential/Platelet     Status:  Abnormal   Collection Time: 06/13/18  7:21 AM  Result Value Ref Range   WBC 6.9 4.0 - 10.5 K/uL   RBC 2.59 (L) 3.87 - 5.11 MIL/uL   Hemoglobin 7.3 (L) 12.0 - 15.0 g/dL   HCT 25.1 (L) 36.0 - 46.0 %   MCV 96.9 80.0 - 100.0 fL   MCH 28.2 26.0 - 34.0 pg   MCHC 29.1 (L) 30.0 - 36.0 g/dL   RDW 15.5 11.5 - 15.5 %   Platelets 230 150 - 400 K/uL   nRBC 0.3 (H) 0.0 - 0.2 %   Neutrophils Relative % 82 %   Neutro Abs 5.7 1.7 - 7.7 K/uL   Lymphocytes Relative 10 %   Lymphs Abs 0.7 0.7 - 4.0 K/uL   Monocytes Relative 4 %   Monocytes Absolute 0.3 0.1 - 1.0 K/uL   Eosinophils Relative 0 %   Eosinophils Absolute 0.0 0.0 - 0.5 K/uL   Basophils Relative 0 %   Basophils Absolute 0.0 0.0 - 0.1 K/uL   Immature Granulocytes 4 %   Abs Immature Granulocytes 0.29 (H) 0.00 - 0.07 K/uL    Comment: Performed at [facility] [address], [place], [place] [address]  Save Smear     Status: None   Collection Time: 06/13/18  7:21 AM  Result Value Ref Range   Smear Review SMEAR STAINED AND AVAILABLE FOR REVIEW     Comment: Performed at [facility] [address] [place], [place] [address]  Potassium     Status: Abnormal   Collection Time: 06/13/18  7:21 AM  Result Value Ref Range   Potassium 6.4 (HH) 3.5 - 5.1 mmol/L    Comment: NO VISIBLE HEMOLYSIS CRITICAL RESULT CALLED TO, READ BACK BY AND VERIFIED WITH: GRACE SISON,RN AT 0813 06/13/2018 BY ZBEECH. Performed at [facility], [place] [address], [place], [place] [address]   Glucose, capillary     Status: Abnormal   Collection Time: 06/13/18  8:19 AM  Result Value Ref Range   Glucose-Capillary 227 (H) 70 - 99 mg/dL  Type and screen [place]     Status: None (Preliminary result)   Collection Time: 06/13/18  8:26 AM  Result Value Ref Range   ABO/RH(D) A POS    Antibody Screen NEG    Sample Expiration 06/16/2018    Unit Number [id number]    Blood Component Type RED CELLS,LR    Unit division 00    Status of  Unit ISSUED    Transfusion Status OK TO TRANSFUSE    Crossmatch Result      Compatible Performed at [facility], [place] [address], [place], [place] [address]   Potassium     Status: Abnormal   Collection Time: 06/13/18 11:55 AM  Result Value Ref Range   Potassium 5.7 (H) 3.5 - 5.1 mmol/L    Comment: Performed at [name] [address], [place], [place] [address]  Glucose, capillary     Status: Abnormal   Collection Time: 06/13/18 12:20 PM  Result Value Ref Range   Glucose-Capillary 228 (H) 70 - 99 mg/dL  Na and K (sodium & potassium), rand urine     Status: None   Collection Time: 06/13/18 12:29 PM  Result Value Ref Range   Sodium, Ur 71 mmol/L   Potassium Urine 25 mmol/L    Comment: Performed at [facility], [place] [address], [place], [place] [address]  Glucose, capillary     Status: Abnormal   Collection Time: 06/13/18  3:35 PM  Result Value Ref Range   Glucose-Capillary 209 (H) 70 - 99 mg/dL  Potassium     Status: Abnormal   Collection Time: 06/13/18  3:44 PM  Result Value Ref Range   Potassium 5.3 (H) 3.5 - 5.1 mmol/L    Comment: Performed at [facility], [place] [address] [place], [place] [address]  Potassium     Status: Abnormal   Collection Time: 06/13/18  9:09 PM  Result Value Ref Range   Potassium 5.4 (H) 3.5 - 5.1 mmol/L    Comment: Performed at [facility], [place] [address] [place], [place] [address]  Glucose, capillary     Status: Abnormal   Collection Time: 06/13/18  9:46 PM  Result Value Ref Range   Glucose-Capillary 222 (H) 70 - 99 mg/dL  Glucose, capillary     Status: Abnormal   Collection Time: 06/13/18 11:51 PM  Result Value Ref Range   Glucose-Capillary 308 (H) 70 - 99 mg/dL  Comprehensive metabolic panel     Status: Abnormal   Collection Time: 06/14/18  1:05 AM  Result Value Ref Range   Sodium 136 135 - 145 mmol/L   Potassium 5.3 (H) 3.5 - 5.1 mmol/L   Chloride 104 98 - 111 mmol/L   CO2 25 22 - 32 mmol/L   Glucose,  Bld 279 (H) 70 - 99 mg/dL   BUN 52 (H) 6 - 20 mg/dL   Creatinine, Ser 4.27 (H) 0.44 - 1.00 mg/dL   Calcium 8.3 (L) 8.9 - 10.3 mg/dL   Total Protein 6.6 6.5 - 8.1 g/dL   Albumin 2.8 (L) 3.5 - 5.0 g/dL   AST 43 (H) 15 - 41 U/L   ALT 35 0 - 44 U/L   Alkaline Phosphatase 187 (H) 38 - 126 U/L   Total Bilirubin 0.3 0.3 - 1.2 mg/dL   GFR calc non Af Amer 11 (L) >60 mL/min   GFR calc Af Amer 13 (L) >60 mL/min   Anion gap 7 5 - 15    Comment: Performed at [facility], [place] [address], [place] [address]  CBC     Status: Abnormal   Collection Time: 06/14/18  1:05 AM  Result Value Ref Range   WBC 8.7 4.0 - 10.5 K/uL   RBC 2.52 (L) 3.87 - 5.11 MIL/uL   Hemoglobin 6.9 (LL) 12.0 - 15.0 g/dL    Comment: REPEATED TO VERIFY THIS CRITICAL RESULT HAS VERIFIED AND BEEN CALLED TO K.WALL,RN BY MELISSA BROGDON ON 02 14 2020 AT 0153, AND HAS BEEN READ BACK.     HCT 24.1 (L) 36.0 - 46.0 %   MCV 95.6 80.0 - 100.0 fL   MCH 27.4 26.0 - 34.0 pg   MCHC 28.6 (L) 30.0 - 36.0 g/dL   RDW 15.5 11.5 - 15.5 %   Platelets 212 150 - 400 K/uL   nRBC 0.5 (  H) 0.0 - 0.2 %    Comment: Performed at [facility], [place] [address], [place], [place] [address]  Prepare RBC     Status: None   Collection Time: 06/14/18  2:15 AM  Result Value Ref Range   Order Confirmation      ORDER PROCESSED BY BLOOD BANK Performed at [facility], [place] [address], [place], [place] [address]   Potassium     Status: Abnormal   Collection Time: 06/14/18  7:37 AM  Result Value Ref Range   Potassium 5.2 (H) 3.5 - 5.1 mmol/L    Comment: Performed at [facility] [address] [place], [place] [address]  Glucose, capillary     Status: Abnormal   Collection Time: 06/14/18  7:57 AM  Result Value Ref Range   Glucose-Capillary 162 (H) 70 - 99 mg/dL  Potassium     Status: None   Collection Time: 06/14/18 11:20 AM  Result Value Ref Range   Potassium 4.5 3.5 - 5.1 mmol/L    Comment: Performed at [facility], [place] [address], [place], [place] [address]  CBC     Status: Abnormal   Collection Time: 06/14/18 11:20 AM  Result Value Ref Range   WBC 8.6 4.0 - 10.5 K/uL   RBC 3.02 (L) 3.87 - 5.11 MIL/uL   Hemoglobin 8.8 (L) 12.0 - 15.0 g/dL    Comment: REPEATED TO VERIFY POST TRANSFUSION SPECIMEN    HCT 28.9 (L) 36.0 - 46.0 %   MCV 95.7 80.0 - 100.0 fL   MCH 29.1 26.0 - 34.0 pg   MCHC 30.4 30.0 - 36.0 g/dL   RDW 15.6 (H) 11.5 - 15.5 %   Platelets 236 150 - 400 K/uL   nRBC 0.3 (H) 0.0 - 0.2 %    Comment: Performed at [facility], [place] [address] [place], [place] [address]  Glucose, capillary     Status: Abnormal   Collection Time: 06/14/18 12:15 PM  Result Value Ref Range   Glucose-Capillary 126 (H) 70 - 99 mg/dL    Dg Chest 2 View  Result Date: 06/12/2018 CLINICAL DATA:  48 year old female with shortness of breath EXAM: CHEST - 2 VIEW COMPARISON:  Multiple prior including [id number] FINDINGS: Cardiomediastinal silhouette unchanged. Unchanged position of tracheostomy. Interlobular septal thickening with fullness in the central vasculature. Opacity at the left lung base. Blunting of the costophrenic sulcus on the lateral view. Thickening of the fissures. IMPRESSION: Similar appearance of the chest x-ray with low lung volumes, bibasilar opacities, potentially combination of edema, consolidation/atelectasis, left greater than right pleural effusion. Electronically Signed   By: Corrie Mckusick D.O.   On: 06/12/2018 19:35   US Renal  Result Date: 06/13/2018 CLINICAL DATA:  Acute kidney injury EXAM: RENAL / URINARY TRACT ULTRASOUND COMPLETE COMPARISON:  None. FINDINGS: Right Kidney: Renal measurements: 12.0 x 7.8 x 5.6 cm = volume: 272 mL. Mildly increased echotexture. 1.9 cm cyst in the lower pole. No hydronephrosis. Left Kidney: Renal measurements: 11.1 x 6.4 x 4.5 cm = volume: 164 mL. 2.7 cm cyst in the midpole. Increased echotexture. No hydronephrosis. Bladder: Appears normal for degree  of bladder distention. IMPRESSION: Increased echotexture in the kidneys bilaterally compatible with chronic medical renal disease. Small bilateral renal cysts. No acute findings.  No hydronephrosis. Electronically Signed   By: Rolm Baptise M.D.   On: 06/13/2018 20:02   Vas [name] Lower Extremity Venous (dvt)  Result Date: 06/13/2018  Lower Venous  Study Indications: Pain, and Swelling.  Performing Technologist: Maudry Mayhew MHA, RDMS, RVT, RDCS  Examination Guidelines: A complete evaluation includes B-mode imaging, spectral Doppler, color Doppler, and power Doppler as needed of all accessible portions of each vessel. Bilateral testing is considered an integral part of a complete examination. Limited examinations for reoccurring indications may be performed as noted.  Right Venous Findings: +---------+---------------+---------+-----------+----------+--------------+          CompressibilityPhasicitySpontaneityPropertiesSummary        +---------+---------------+---------+-----------+----------+--------------+ CFV      Full           Yes      Yes                                 +---------+---------------+---------+-----------+----------+--------------+ SFJ      Full                                                        +---------+---------------+---------+-----------+----------+--------------+ FV Prox  Full                                                        +---------+---------------+---------+-----------+----------+--------------+ FV Mid   Full                                                        +---------+---------------+---------+-----------+----------+--------------+ FV DistalFull                                                        +---------+---------------+---------+-----------+----------+--------------+ PFV      Full                                                        +---------+---------------+---------+-----------+----------+--------------+  POP      Full           Yes      Yes                                 +---------+---------------+---------+-----------+----------+--------------+ PTV      Full                                                        +---------+---------------+---------+-----------+----------+--------------+ PERO  Not visualized +---------+---------------+---------+-----------+----------+--------------+  Left Venous Findings: +---------+---------------+---------+-----------+----------+--------------+          CompressibilityPhasicitySpontaneityPropertiesSummary        +---------+---------------+---------+-----------+----------+--------------+ CFV      Full           Yes      Yes                                 +---------+---------------+---------+-----------+----------+--------------+ SFJ      Full                                                        +---------+---------------+---------+-----------+----------+--------------+ FV Prox  Full                                                        +---------+---------------+---------+-----------+----------+--------------+ FV Mid   Full                                                        +---------+---------------+---------+-----------+----------+--------------+ FV DistalFull                                                        +---------+---------------+---------+-----------+----------+--------------+ PFV      Full                                                        +---------+---------------+---------+-----------+----------+--------------+ POP      Full           Yes      Yes                                 +---------+---------------+---------+-----------+----------+--------------+ PTV                                                   Not visualized +---------+---------------+---------+-----------+----------+--------------+ PERO                                                   Not visualized +---------+---------------+---------+-----------+----------+--------------+    Summary: Right: There is no evidence of deep vein thrombosis in the lower extremity. However, portions of this examination were limited- see technologist comments above. No cystic structure found in the popliteal fossa. Left: There is no evidence of deep vein thrombosis in the  lower extremity. However, portions of this examination were limited- see technologist comments above. No cystic structure found in the popliteal fossa.  *See table(s) above for measurements and observations. Electronically signed by Monica Martinez MD on 06/13/2018 at 2:48:27 PM.    Final     Assessment/Plan  **AKI on CKD:  CKD with baseline Cr 2.4 who is seen for AKI.  The rise in creatinine following diuresis (albeit a very modest diuresis) is very suggestive of hypovolemia.  Her chronic LLE edema and habitus make the physical exam difficult to assess her true volume status.  She's had no other insults, has normal [name] and this timeline makes the most sense.   --Continue to hold diuretics.  --Hold on hydration given she's so tenuous --Urine sodium and creatinine - if sodium high or FeNa high can't conclude much but if it's low that will really suggest hypovolemia.  --Will follow closely with you  **Hyperkalemia:  Resolved, monitor  **Anemia: transfused 1u pRBC today.  No recent iron studies, won't check as just transfused. Could consider ESA.   **HFrEF: initially felt to be in decompensated state given dyspnea but in light of rising creatinine with diuresis may not have been the case.    Call with concerns. Will follow closely   Justin Mend 06/14/2018, 4:55 PM

## 2018-06-14 NOTE — Progress Notes (Signed)
Pt. Hb 6.9. On call for IM paged to make aware. New orders received.

## 2018-06-14 NOTE — Progress Notes (Addendum)
Subjective:  Melody Gray is a 48 y.o. with PMH of HFpEF, trach-dependent OSH, HTN, CKD4, T2DM, and chronic respiratory failure on 2L O2 [place] admit for acute on chronic respiratory failure on hospital 2  Melody Gray was examined and evaluated at bedside this AM. She was observed sitting up comfortably on bed. She states she had asymptomatic desaturation event overnight and respiratory therapist came to increase her oxygen to 5L [place]. Currently back down to 2L. She has no acute complaints at this time. Continuing to endorse chronic cough. Denies any significant chest pain, palpitations, respiratory distress, numbness, tingling, cramps. She states she had good urine output despite not getting lasix.  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 with PMH of HFpEF, trach-dependent OSH, HTN, CKD4, T2DM, and chronic respiratory failure on 2L O2 [place]  admit for acute on chronic respiratory failure. Her renal function is worsening despite no longer receiving diuretics. Currently appears volume overloaded. Her potassium is better controlled but unable to pinpoint etiology of worsening renal function. Will consult nephrology. Also had another drop in hemoglobin overnight. Finished receiving 1 unit of blood this morning.   Hyperkalemia 2/2 acute on chronic renal failure Improving on Lokelma K 6.4->5.2. Renal fx worsening w/ creatinine 3.82->4.27. Not on diuretics or obvious nephrotoxic meds. Renal US significant for medical renal disease and cysts but no hydronephrosis. - Consult to nephrology - Trend potassium q4hrs until normal x2 - C/wt Lokelma 10g daily - C/w diabetic reigmen: Levemir 33 units qhs, novolog 6units TID qc, SSI - Hold nephrotoxic meds  Dyspnea 2/2 acute on chronic HFpEF vs acute on chronic renal failure An[place]arca on exam. Continuing to have intermittent increase in oxygen requirement. Good urine output overnight but weight recorded as increasing. Diuretics held yesterday 2/2 worsening renal function - Strict I&Os - Daily weights - C/w coreg 6.25mg  BID - Replete Mag as needed - Keep O2 sat >88  Normocytic Anemia 2/2 anemia of chronic disease Baseline hemoglobin 11. Trended down and stayed stable around 8.0 during last prolonged admission in January. Drop from 8.0->7.3->6.9 this am. Given 1 unit pRBC. - F/u post-transfusion CBC - Trend CBC  T2DM Fasting am 162, Had nighttime bg of 308 given extra 5 units overnight. Hesitant to increase long acting in setting of renal failure. - C/w mealtime  6 units TID qc - C/w Lantus to 30 units daily - Glucose checks  - Sliding scale insulin  DVT prophx: subqheparin Diet: Renal/Diabetic w/ fluid restrict Bowel: Miralax Code: Full  Dispo: Anticipated discharge in approximately 2-3 day(s).   [name], MD 06/14/2018, 11:18 AM Pager: [phone]

## 2018-06-15 DIAGNOSIS — Z79899 Other long term (current) drug therapy: Secondary | ICD-10-CM

## 2018-06-15 LAB — TYPE AND SCREEN
ABO/RH(D): A POS
Antibody Screen: NEGATIVE
Unit division: 0

## 2018-06-15 LAB — CBC
HCT: 27.4 % — ABNORMAL LOW (ref 36.0–46.0)
Hemoglobin: 8.2 g/dL — ABNORMAL LOW (ref 12.0–15.0)
MCH: 28.9 pg (ref 26.0–34.0)
MCHC: 29.9 g/dL — ABNORMAL LOW (ref 30.0–36.0)
MCV: 96.5 fL (ref 80.0–100.0)
Platelets: 222 10*3/uL (ref 150–400)
RBC: 2.84 MIL/uL — ABNORMAL LOW (ref 3.87–5.11)
RDW: 15.6 % — ABNORMAL HIGH (ref 11.5–15.5)
WBC: 9.3 10*3/uL (ref 4.0–10.5)
nRBC: 0.4 % — ABNORMAL HIGH (ref 0.0–0.2)

## 2018-06-15 LAB — MAGNESIUM: Magnesium: 1.8 mg/dL (ref 1.7–2.4)

## 2018-06-15 LAB — GLUCOSE, CAPILLARY
Glucose-Capillary: 157 mg/dL — ABNORMAL HIGH (ref 70–99)
Glucose-Capillary: 149 mg/dL — ABNORMAL HIGH (ref 70–99)
Glucose-Capillary: 154 mg/dL — ABNORMAL HIGH (ref 70–99)
Glucose-Capillary: 160 mg/dL — ABNORMAL HIGH (ref 70–99)

## 2018-06-15 LAB — RENAL FUNCTION PANEL
Albumin: 2.9 g/dL — ABNORMAL LOW (ref 3.5–5.0)
Anion gap: 8 (ref 5–15)
BUN: 59 mg/dL — ABNORMAL HIGH (ref 6–20)
CO2: 26 mmol/L (ref 22–32)
Calcium: 8.6 mg/dL — ABNORMAL LOW (ref 8.9–10.3)
Chloride: 102 mmol/L (ref 98–111)
Creatinine, Ser: 4.36 mg/dL — ABNORMAL HIGH (ref 0.44–1.00)
GFR calc Af Amer: 13 mL/min — ABNORMAL LOW (ref >60)
GFR calc non Af Amer: 11 mL/min — ABNORMAL LOW (ref >60)
Glucose, Bld: 184 mg/dL — ABNORMAL HIGH (ref 70–99)
Phosphorus: 6.2 mg/dL — ABNORMAL HIGH (ref 2.5–4.6)
Potassium: 5 mmol/L (ref 3.5–5.1)
Sodium: 136 mmol/L (ref 135–145)

## 2018-06-15 LAB — BPAM RBC
Blood Product Expiration Date: 202002292359
ISSUE DATE / TIME: 202002140302
Unit Type and Rh: 6200

## 2018-06-15 NOTE — Plan of Care (Signed)
  Problem: Education: Goal: Knowledge of General Education information will improve Description Including pain rating scale, medication(s)/side effects and non-pharmacologic comfort measures Outcome: Progressing   Problem: Clinical Measurements: Goal: Ability to maintain clinical measurements within normal limits will improve Outcome: Progressing   Problem: Clinical Measurements: Goal: Cardiovascular complication will be avoided Outcome: Progressing   Problem: Activity: Goal: Risk for activity intolerance will decrease Outcome: Progressing   Problem: Coping: Goal: Level of anxiety will decrease Outcome: Progressing   Problem: Safety: Goal: Ability to remain free from injury will improve Outcome: Progressing

## 2018-06-15 NOTE — Progress Notes (Signed)
Per RRT, the patient refused trach O2. They will try again tonight.

## 2018-06-15 NOTE — Progress Notes (Signed)
Subjective:  Melody Gray is a 48 y.o. with PMH of HFpEF, trach-dependent OSH, HTN, CKD4, T2DM, and chronic respiratory failure on 2L O2 [place] admit for acute on chronic respiratory failure on hospital 2  Melody Gray was seen at bedside on rounds this morning. She was laying comfortably in bed, but was noted to be desaturating into the 70s on 2-3L while sleeping. She denies dyspnea nor other complaints this morning. She was informed that we continue to evaluate the cause of her AKI and are working with Nephrology on this. Regarding her desaturations, it was suggested that she try oxygen through her trach as opposed to [place]. States urine output remains good. No other complaints or questions this AM.  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 with PMH of HFpEF, trach-dependent OSH, HTN, CKD4, T2DM, and chronic respiratory failure on 2L O2 [place]  admit for acute on chronic respiratory failure. Her renal function is worsening despite no longer receiving diuretics. Currently appears volume overloaded. Her potassium is better controlled but unable to pinpoint etiology of worsening renal function. Nephrology following. Hgb stable at 8.2 this AM.   Hyperkalemia 2/2 acute on chronic renal failure Improving on Lokelma K 6.4->5.2->5.0. Renal fx worsening w/ creatinine 3.82->4.27->4.36. Not on diuretics or obvious nephrotoxic meds. Renal US significant for medical renal disease and cysts but no hydronephrosis. FENa (based on yesterdays serum labs) 0.9% indicating pre-renal. - Appreciate nephrology recs - C/wt Lokelma 10g daily - C/w diabetic reigmen: Levemir 33 units qhs, novolog 6units TID qc, SSI - Hold nephrotoxic meds - Daily RFP  Dyspnea 2/2 acute on chronic HFpEF vs acute on chronic renal failure Continuing to have intermittent increase in oxygen requirement. Good urine output overnight but weight recorded as increasing. Diuretics held yesterday 2/2 worsening renal function. Increased requirement overnight may be 2/2 OSA with O2 circulating in pharynx and not reaching lower airway due to trach. - Strict I&Os - Daily weights  - C/w coreg 6.25mg  BID - Replete Mag as needed - Keep O2 sat >88 - Trach O2 at night and during day if patient amenable  Normocytic Anemia 2/2 anemia of chronic disease Baseline hemoglobin 11. Trended down and stayed stable around 8.0 during last prolonged admission in January. Drop from 8.0->7.3->6.9. Hgb stable at 8.2 s/p 1 unit  pRBC on 2/14. - Trend CBC  T2DM: CBGs 150s-180s this AM. Would be hesitant to increase long acting in setting of renal failure. - C/w mealtime 6 units TID qc - C/w Lantus to 30 units daily - Glucose checks  - Sliding scale insulin  DVT prophx: subqheparin Diet: Renal/Diabetic w/ fluid restrict Bowel: Miralax Code: Full  Dispo: Anticipated discharge in approximately 2-3 day(s).    Melody Seat, MD 06/15/2018, 11:32 AM

## 2018-06-15 NOTE — Progress Notes (Signed)
[facility] Progress Note   Subjective:   I/Os 720/750 yesterday.  Cr 2.37 > 4.36.  She said she feels better.   Objective Vitals:   06/14/18 2322 06/15/18 0326 06/15/18 0457 06/15/18 0846  BP:   131/64 114/61  Pulse: 90 86 82 86  Resp: 18 16 20    Temp:   98.8 F (37.1 C) 98 F (36.7 C)  TempSrc:   Oral Oral  SpO2: 99% 93% 100%   Weight:   100 kg   Height:       Physical Exam General: sitting comfortably at EOB Heart: RRR Lungs: normal WOB, no rales Abdomen: soft obese Extremities: LLE with 1+ nonpitting edema and healing fasciotomy scar  Additional Objective Labs: Basic Metabolic Panel: Recent Labs  Lab 06/13/18 0516  06/14/18 0105 06/14/18 0737 06/14/18 1120 06/15/18 0651  NA 137  --  136  --   --  136  K 6.4*   < > 5.3* 5.2* 4.5 5.0  CL 103  --  104  --   --  102  CO2 25  --  25  --   --  26  GLUCOSE 273*  --  279*  --   --  184*  BUN 40*  --  52*  --   --  59*  CREATININE 3.82*  --  4.27*  --   --  4.36*  CALCIUM 8.7*  --  8.3*  --   --  8.6*  PHOS  --   --   --   --   --  6.2*   < > = values in this interval not displayed.   Liver Function Tests: Recent Labs  Lab 06/14/18 0105 06/15/18 0651  AST 43*  --   ALT 35  --   ALKPHOS 187*  --   BILITOT 0.3  --   PROT 6.6  --   ALBUMIN 2.8* 2.9*   No results for input(s): LIPASE, AMYLASE in the last 168 hours. CBC: Recent Labs  Lab 06/13/18 0516 06/13/18 0721 06/14/18 0105 06/14/18 1120 06/15/18 0651  WBC 6.9 6.9 8.7 8.6 9.3  NEUTROABS  --  5.7  --   --   --   HGB 7.2* 7.3* 6.9* 8.8* 8.2*  HCT 25.0* 25.1* 24.1* 28.9* 27.4*  MCV 97.7 96.9 95.6 95.7 96.5  PLT 223 230 212 236 222   Blood Culture    Component Value Date/Time   SDES BLOOD RIGHT ANTECUBITAL 05/06/2018 1205   SPECREQUEST  05/06/2018 1205    BOTTLES DRAWN AEROBIC ONLY Blood Culture results may not be optimal due to an inadequate volume of blood received in culture bottles   CULT  05/06/2018 1205    NO GROWTH 5  DAYS Performed at [facility], [place] [address], [place], [place] [address]    REPTSTATUS 05/11/2018 FINAL 05/06/2018 1205    Cardiac Enzymes: Recent Labs  Lab 06/12/18 0509  TROPONINI <0.03   CBG: Recent Labs  Lab 06/14/18 0757 06/14/18 1215 06/14/18 1715 06/14/18 2152 06/15/18 0847  GLUCAP 162* 126* 157* 316* 157*   Iron Studies: No results for input(s): IRON, TIBC, TRANSFERRIN, FERRITIN in the last 72 hours. @lablastinr3 @ Studies/Results: US Renal  Result Date: 06/13/2018 CLINICAL DATA:  Acute kidney injury EXAM: RENAL / URINARY TRACT ULTRASOUND COMPLETE COMPARISON:  None. FINDINGS: Right Kidney: Renal measurements: 12.0 x 7.8 x 5.6 cm = volume: 272 mL. Mildly increased echotexture. 1.9 cm cyst in the lower pole. No hydronephrosis. Left Kidney: Renal  measurements: 11.1 x 6.4 x 4.5 cm = volume: 164 mL. 2.7 cm cyst in the midpole. Increased echotexture. No hydronephrosis. Bladder: Appears normal for degree of bladder distention. IMPRESSION: Increased echotexture in the kidneys bilaterally compatible with chronic medical renal disease. Small bilateral renal cysts. No acute findings.  No hydronephrosis. Electronically Signed   By: Rolm Baptise M.D.   On: 06/13/2018 20:02   Medications:  . atorvastatin  40 mg Oral Daily  . carvedilol  6.25 mg Oral BID  . heparin  5,000 Units Subcutaneous Q8H  . insulin aspart  0-9 Units Subcutaneous TID WC  . insulin aspart  6 Units Subcutaneous TID WC  . insulin detemir  30 Units Subcutaneous QHS  . pantoprazole  40 mg Oral BID  . sodium zirconium cyclosilicate  10 g Oral Daily   Assessment/Plan  **AKI on CKD:  CKD with baseline Cr 2.4 who is seen for AKI.  The rise in creatinine following diuresis (albeit a very modest diuresis) is very suggestive of hypovolemia.  Her chronic LLE edema and habitus make the physical exam difficult to assess her true volume status.  She's had no other insults, has normal [name] and this timeline makes the  most sense.  FeNa 0.9% yesterday supports that.  --Cr relatively stable c/w yesterday in setting of net even I/Os --Continue to hold diuretics and also hold on hydration given she's so tenuous.  If unable to take po intake could give a small bolus (NS 279mL).   --Will follow closely with you  **Hyperkalemia:  Resolved, monitor  **Anemia: transfused 1u pRBC today.  No recent iron studies, won't check as just transfused. Consider ESA.   **HFrEF: initially felt to be in decompensated state given dyspnea but in light of rising creatinine with diuresis may not have been the case.    **Chronic hypoxic resp failure/ OSA: Chronic supplemental O2    **DM: per primary  Call with concerns. Will follow closely   Jannifer Hick MD 06/15/2018, 8:57 AM  [facility] Pager: [phone]

## 2018-06-15 NOTE — Progress Notes (Signed)
  Date: 06/15/2018  Patient name: Topaz Raglin Mrozek  Medical record number: [id number]  Date of birth: 1971-03-27   I have seen and evaluated this patient and I have discussed the plan of care with the house staff. Please see their note for complete details. I concur with their findings with the following additions/corrections:   Chemistry essentially unchanged from yesterday.  Net even since yesterday according to I/O.  Volume assessment is difficult on exam, but she appears relatively euvolemic.  She was quite hypoxic with a sat in the 70s when we rounded on her today, and I suspect that she has certain degree of OSA when she sleeps that prevents oxygen by nasal cannula been very effective for her.  She may need oxygen by her trach in order to be effective during sleep.  Her sat had improved to 88% by the time we left.  Appreciate nephrology consultation.  Agree that, given her tenuous volume status, we will continue to hold diuresis but not give any fluid back at this time.  We will continue to track her renal function and urine output.  Hopefully her kidneys will recover a little bit from the prerenal AKI.  Lenice Pressman, M.D., Ph.D. 06/15/2018, 2:46 PM

## 2018-06-15 NOTE — Progress Notes (Signed)
Increased O2 to 6L due to lowering saturation as patient sleeps. No distress noted. After educating patient, patient still does not want to wear trach collar at this time.

## 2018-06-16 DIAGNOSIS — I5032 Chronic diastolic (congestive) heart failure: Secondary | ICD-10-CM

## 2018-06-16 LAB — CBC
HCT: 30 % — ABNORMAL LOW (ref 36.0–46.0)
Hemoglobin: 8.9 g/dL — ABNORMAL LOW (ref 12.0–15.0)
MCH: 28.8 pg (ref 26.0–34.0)
MCHC: 29.7 g/dL — ABNORMAL LOW (ref 30.0–36.0)
MCV: 97.1 fL (ref 80.0–100.0)
Platelets: 216 10*3/uL (ref 150–400)
RBC: 3.09 MIL/uL — ABNORMAL LOW (ref 3.87–5.11)
RDW: 15.7 % — ABNORMAL HIGH (ref 11.5–15.5)
WBC: 9.4 10*3/uL (ref 4.0–10.5)
nRBC: 0.4 % — ABNORMAL HIGH (ref 0.0–0.2)

## 2018-06-16 LAB — RENAL FUNCTION PANEL
Albumin: 3 g/dL — ABNORMAL LOW (ref 3.5–5.0)
Anion gap: 9 (ref 5–15)
BUN: 62 mg/dL — ABNORMAL HIGH (ref 6–20)
CO2: 26 mmol/L (ref 22–32)
Calcium: 9.1 mg/dL (ref 8.9–10.3)
Chloride: 104 mmol/L (ref 98–111)
Creatinine, Ser: 4.46 mg/dL — ABNORMAL HIGH (ref 0.44–1.00)
GFR calc Af Amer: 13 mL/min — ABNORMAL LOW (ref >60)
GFR calc non Af Amer: 11 mL/min — ABNORMAL LOW (ref >60)
Glucose, Bld: 130 mg/dL — ABNORMAL HIGH (ref 70–99)
Phosphorus: 6.1 mg/dL — ABNORMAL HIGH (ref 2.5–4.6)
Potassium: 4.8 mmol/L (ref 3.5–5.1)
Sodium: 139 mmol/L (ref 135–145)

## 2018-06-16 LAB — GLUCOSE, CAPILLARY
Glucose-Capillary: 134 mg/dL — ABNORMAL HIGH (ref 70–99)
Glucose-Capillary: 61 mg/dL — ABNORMAL LOW (ref 70–99)
Glucose-Capillary: 67 mg/dL — ABNORMAL LOW (ref 70–99)
Glucose-Capillary: 105 mg/dL — ABNORMAL HIGH (ref 70–99)
Glucose-Capillary: 148 mg/dL — ABNORMAL HIGH (ref 70–99)
Glucose-Capillary: 331 mg/dL — ABNORMAL HIGH (ref 70–99)

## 2018-06-16 MED ORDER — DARBEPOETIN ALFA 60 MCG/0.3ML IJ SOSY
60.0000 ug | PREFILLED_SYRINGE | Freq: Once | INTRAMUSCULAR | Status: AC
  Administered 2018-06-16: 60 ug via SUBCUTANEOUS
  Filled 2018-06-16: qty 0.3

## 2018-06-16 MED ORDER — SODIUM CHLORIDE 0.9 % IV BOLUS
500.0000 mL | Freq: Once | INTRAVENOUS | Status: AC
  Administered 2018-06-16: 500 mL via INTRAVENOUS

## 2018-06-16 MED ORDER — INSULIN ASPART 100 UNIT/ML ~~LOC~~ SOLN
7.0000 [IU] | Freq: Three times a day (TID) | SUBCUTANEOUS | Status: DC
  Administered 2018-06-16 – 2018-06-20 (×12): 7 [IU] via SUBCUTANEOUS

## 2018-06-16 NOTE — Progress Notes (Signed)
[facility] Progress Note   Subjective:   I/Os 240/1050 yesterday without diuretic.  No new complaints.  She said she feels better.   Objective Vitals:   06/16/18 0052 06/16/18 0422 06/16/18 0437 06/16/18 0500  BP:   (!) 142/60   Pulse: 85  76   Resp: (!) 22  18   Temp:   98 F (36.7 C)   TempSrc:   Oral   SpO2: 92% (!) 89% 99%   Weight:    99.6 kg  Height:       Physical Exam General: lying flat in bed comfortably Heart: RRR Lungs: normal WOB, no rales Abdomen: soft obese Extremities: LLE with 1+ nonpitting edema and healing fasciotomy scar  Additional Objective Labs: Basic Metabolic Panel: Recent Labs  Lab 06/13/18 0516  06/14/18 0105 06/14/18 0737 06/14/18 1120 06/15/18 0651  NA 137  --  136  --   --  136  K 6.4*   < > 5.3* 5.2* 4.5 5.0  CL 103  --  104  --   --  102  CO2 25  --  25  --   --  26  GLUCOSE 273*  --  279*  --   --  184*  BUN 40*  --  52*  --   --  59*  CREATININE 3.82*  --  4.27*  --   --  4.36*  CALCIUM 8.7*  --  8.3*  --   --  8.6*  PHOS  --   --   --   --   --  6.2*   < > = values in this interval not displayed.   Liver Function Tests: Recent Labs  Lab 06/14/18 0105 06/15/18 0651  AST 43*  --   ALT 35  --   ALKPHOS 187*  --   BILITOT 0.3  --   PROT 6.6  --   ALBUMIN 2.8* 2.9*   No results for input(s): LIPASE, AMYLASE in the last 168 hours. CBC: Recent Labs  Lab 06/13/18 0516 06/13/18 0721 06/14/18 0105 06/14/18 1120 06/15/18 0651  WBC 6.9 6.9 8.7 8.6 9.3  NEUTROABS  --  5.7  --   --   --   HGB 7.2* 7.3* 6.9* 8.8* 8.2*  HCT 25.0* 25.1* 24.1* 28.9* 27.4*  MCV 97.7 96.9 95.6 95.7 96.5  PLT 223 230 212 236 222   Blood Culture    Component Value Date/Time   SDES BLOOD RIGHT ANTECUBITAL 05/06/2018 1205   SPECREQUEST  05/06/2018 1205    BOTTLES DRAWN AEROBIC ONLY Blood Culture results may not be optimal due to an inadequate volume of blood received in culture bottles   CULT  05/06/2018 1205    NO GROWTH 5  DAYS Performed at [facility], [place] [address] [place], [place] [address]    REPTSTATUS 05/11/2018 FINAL 05/06/2018 1205    Cardiac Enzymes: Recent Labs  Lab 06/12/18 0509  TROPONINI <0.03   CBG: Recent Labs  Lab 06/15/18 0847 06/15/18 1253 06/15/18 1636 06/15/18 2128 06/16/18 0734  GLUCAP 157* 149* 154* 160* 134*   Iron Studies: No results for input(s): IRON, TIBC, TRANSFERRIN, FERRITIN in the last 72 hours. @lablastinr3 @ Studies/Results: No results found. Medications:  . atorvastatin  40 mg Oral Daily  . carvedilol  6.25 mg Oral BID  . heparin  5,000 Units Subcutaneous Q8H  . insulin aspart  0-9 Units Subcutaneous TID WC  . insulin aspart  6 Units Subcutaneous TID WC  .  insulin detemir  30 Units Subcutaneous QHS  . pantoprazole  40 mg Oral BID  . sodium zirconium cyclosilicate  10 g Oral Daily   Assessment/Plan  **AKI on CKD:  CKD with baseline Cr 2.4 who is seen for AKI.  The rise in creatinine following diuresis (albeit a very modest diuresis) is very suggestive of hypovolemia.  Her chronic LLE edema and habitus make the physical exam difficult to assess her true volume status.  She's had no other insults, has normal [name] and this timeline makes the most sense.  FeNa 0.9% 2/14supports that.  --Cr a bit worse today (no fluids or diuretics yesterday but net neg 1L per I/Os).  --Will give NS 590mL as a small fluid challenge. --Will follow closely with you  **Hyperkalemia:  Resolved, monitor  **Anemia: transfused 1u pRBC 2/15, improved to 8.9.  No recent iron studies, won't check as just transfused. Aranesp 60 today.   **HFrEF: initially felt to be in decompensated state given dyspnea but in light of rising creatinine with diuresis may not have been the case.    **Chronic hypoxic resp failure/ OSA: Chronic supplemental O2    **DM: per primary  Call with concerns. Will follow closely   Jannifer Hick MD 06/16/2018, 8:18 AM  [facility] Pager: [phone]

## 2018-06-16 NOTE — Progress Notes (Signed)
Hypoglycemic Event  CBG: 67  Treatment: 15 minute protocol. Juice and crackers  Symptoms: asymptomatic Follow-up CBG: Time1230 CBG Result:105   Comments/MD notified: pager [place]

## 2018-06-16 NOTE — Progress Notes (Signed)
Unable to start sodium chloride 0.9% bolus 500 ml at this time. Current iv leaking when flushed, try fixing and reposition iv, it did not worked.  Tried starting and iv but patients hand was trembling. Put in an IV consult order per protocol. Awaiting on new peripheral IV to start fluids.

## 2018-06-16 NOTE — Progress Notes (Signed)
Hypoglycemic Event  CBG: 61  Treatment: apple juice and crackers   Symptoms: pt states feels sleepy.  Follow-up CBG: [id number] CBG Result:67    Comments/MD notified: pager: [place]

## 2018-06-16 NOTE — Progress Notes (Signed)
  Date: 06/16/2018  Patient name: Melody Gray  Medical record number: [id number]  Date of birth: [birth year]   I have seen and evaluated this patient and I have discussed the plan of care with the house staff. Please see their note for complete details. I concur with their findings with the following additions/corrections:   Acute kidney injury on CKD continues to worsen a little bit today despite holding diuretics.  However, she did have about 1 L net negative yesterday according to I/O.  Discussed with nephrology, appreciate their recommendations, agree to trial given 500 cc IV fluids and encouraging oral fluids today and see how she does tomorrow.  Respiratory status seems stable today, hopefully we will not tip her over with additional fluids, but would really like to see some improvement in her renal function.  Lenice Pressman, M.D., Ph.D. 06/16/2018, 2:15 PM

## 2018-06-16 NOTE — Progress Notes (Addendum)
   Subjective:  Melody Gray is a 48 y.o. with PMH of HFpEF, trach-dependent OSH, HTN, CKD4, T2DM, and chronic respiratory failure on 2L O2 [place] admit for acute on chronic respiratory failure on hospital 2  Melody Gray was examined and evaluated at bedside this AM. She was observed sitting comfortably up in bed with oxygen saturation in high 90s with blow-by oxygen. She states she feels about the same as on admission. Denies any significant respiratory distress overnight. She states she has been endorsing good urine output even w/o diuretics. Denies any significant cough.  Objective:  Vital signs in last 24 hours: Vitals:   06/16/18 0052 06/16/18 0422 06/16/18 0437 06/16/18 0500  BP:   (!) 142/60   Pulse: 85  76   Resp: (!) 22  18   Temp:   98 F (36.7 C)   TempSrc:   Oral   SpO2: 92% (!) 89% 99%   Weight:    99.6 kg  Height:       Gen: Well-developed, Obese, NAD Neck: supple, ROM intact, yellow mucus drainage from trach CV: RRR, S1, S2 normal Pulm: CTAB, No rales, no wheezes  Extm: ROM intact, Peripheral pulses intact, 2+ pitting edema up to upper shin. 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 with PMH of HFpEF, trach-dependent OSH, HTN, CKD4, T2DM, and chronic respiratory failure on 2L O2 [place] admit for acute on chronic respiratory failure.  Hemoglobin and potassium is stable and renal function stable for now although worse than baseline. Her respiratory status fluctuates but physical exam shows improving rales.   Hyperkalemia 2/2 acute on chronic renal failure K WNL @ 4.8 this am. Renal fx worsening w/ creatinine trend 3.82-> 4.27-> 4.36-> 4.43.  - Appreciate nephrology recs - C/w diabetic reigmen: Levemir 33 units qhs, novolog 6units TID qc, SSI - Hold nephrotoxic meds - Daily RFP  Dyspnea 2/2 acute on chronic HFpEF vs acute on chronic renal failure 94 O2 on 4L through trach. 1L urine output overnight. Rales on physical exam improving - Strict I&Os - Daily weights  - C/w coreg 6.25mg  BID - Replete Mag as needed - Keep O2 sat >88 - Trach O2 at night and during day if patient amenable  Normocytic Anemia 2/2 anemia of chronic disease Received 1 unit 2/14. 8.8->8.2 ->8.9 - Trend CBC  T2DM: CBGs 150s-180s this AM. Required 3 extra aspart yesterday. Would be hesitant to increase long acting in setting of renal failure.  - Increase to mealtime 7 units TID qc - C/w Lantus to 30 units daily - Glucose checks  - SSI  DVT prophx: subqheparin Diet: Renal/Diabetic w/ fluid restrict Bowel: Miralax Code: Full  Dispo: Anticipated discharge in approximately 1-2 day(s).   [name], MD 06/16/2018, 7:17 AM Pager: [phone]

## 2018-06-17 ENCOUNTER — Encounter: Payer: Medicare Other | Admitting: Dietician

## 2018-06-17 LAB — RENAL FUNCTION PANEL
Albumin: 2.6 g/dL — ABNORMAL LOW (ref 3.5–5.0)
Anion gap: 7 (ref 5–15)
BUN: 61 mg/dL — ABNORMAL HIGH (ref 6–20)
CO2: 27 mmol/L (ref 22–32)
Calcium: 8.6 mg/dL — ABNORMAL LOW (ref 8.9–10.3)
Chloride: 107 mmol/L (ref 98–111)
Creatinine, Ser: 3.85 mg/dL — ABNORMAL HIGH (ref 0.44–1.00)
GFR calc Af Amer: 15 mL/min — ABNORMAL LOW (ref >60)
GFR calc non Af Amer: 13 mL/min — ABNORMAL LOW (ref >60)
Glucose, Bld: 136 mg/dL — ABNORMAL HIGH (ref 70–99)
Phosphorus: 4.9 mg/dL — ABNORMAL HIGH (ref 2.5–4.6)
Potassium: 4.3 mmol/L (ref 3.5–5.1)
Sodium: 141 mmol/L (ref 135–145)

## 2018-06-17 LAB — CBC
HCT: 27.4 % — ABNORMAL LOW (ref 36.0–46.0)
Hemoglobin: 8 g/dL — ABNORMAL LOW (ref 12.0–15.0)
MCH: 28.2 pg (ref 26.0–34.0)
MCHC: 29.2 g/dL — ABNORMAL LOW (ref 30.0–36.0)
MCV: 96.5 fL (ref 80.0–100.0)
Platelets: 168 10*3/uL (ref 150–400)
RBC: 2.84 MIL/uL — ABNORMAL LOW (ref 3.87–5.11)
RDW: 15.4 % (ref 11.5–15.5)
WBC: 7.8 10*3/uL (ref 4.0–10.5)
nRBC: 0 % (ref 0.0–0.2)

## 2018-06-17 LAB — MAGNESIUM: Magnesium: 1.8 mg/dL (ref 1.7–2.4)

## 2018-06-17 LAB — GLUCOSE, CAPILLARY
Glucose-Capillary: 116 mg/dL — ABNORMAL HIGH (ref 70–99)
Glucose-Capillary: 99 mg/dL (ref 70–99)
Glucose-Capillary: 116 mg/dL — ABNORMAL HIGH (ref 70–99)
Glucose-Capillary: 151 mg/dL — ABNORMAL HIGH (ref 70–99)

## 2018-06-17 MED ORDER — SODIUM CHLORIDE 0.9 % IV BOLUS
500.0000 mL | Freq: Once | INTRAVENOUS | Status: AC
  Administered 2018-06-17: 500 mL via INTRAVENOUS

## 2018-06-17 NOTE — Progress Notes (Signed)
Subjective:  Melody Gray is a 48 y.o. with PMH of HFpEF, trach-dependent OSH, HTN, CKD4, T2DM, and chronic respiratory failure on 2L O2 [place] admit for dyspnea on hospital day 4  MelodyGray was examined and evaluated at bedside this AM. She was observed sitting comfortably in bed this am. She states some concerns about her memory issues, stating her family noted that she has difficulty retaining memory. Discussed the option of performing MOCA test later today to evaluate baseline cognitive ability. She is continuing to urinate without issues and states her respiratory status seems unchanged. Continuing to have hypoxic events at nighttime but she states she does not feel out of breath.  Objective:  Vital signs in last 24 hours: Vitals:   06/17/18 0908 06/17/18 0917 06/17/18 1110 06/17/18 1213  BP:  (!) 153/73  132/70  Pulse: 86 82 85 74  Resp: 18  16 15   Temp:    98.8 F (37.1 C)  TempSrc:    Oral  SpO2: 96% 98% 100% 99%  Weight:      Height:       Physical Exam  Constitutional: She is oriented to person, place, and time and well-developed, well-nourished, and in no distress. No distress.  obese  Neck:  Tracheostomy in place with oxygen plugged in  Cardiovascular: Normal rate, regular rhythm, normal heart sounds and intact distal pulses.  Pulmonary/Chest: Effort normal and breath sounds normal. She has no wheezes. She has no rales.  Abdominal: Soft. Bowel sounds are normal. There is no abdominal tenderness.  Musculoskeletal: Normal range of motion.        General: Tenderness (bilateral 2+ pitting edema up to lower thighs) present.  Neurological: She is alert and oriented to person, place, and time.  Skin: Skin is warm and dry.   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 She underwent fluid challenge yesterday with improvement in her renal function despite her anasarca on exam. Will attempt to add on more fluid for her. If she decompensates, she will need diuresis.  Acute on chronic renal failure K trend 5.0->4.8->4.3. Renal function show slight improvement 4.46->3.85 after fluid challenge. Baseline creatinine 2.38.  - Will give additional 500cc NS and trend renal function - Appreciate nephrology recs:  - Hold nephrotoxic meds  Dyspnea 2/2 acute on chronic HFpEF vs acute on chronic renal failure 1.2L urine output overnight. Mag 1.8. Currently satting 99 with O2 through trach. Overnight note about patient refusing oxygen but she states she cannot recall. She has concerns about her short term memory issues - May be having altered mentation due to CO2 retention/hypoxemia - MOCA to assess baseline mental status - patient refused - Strict I&Os - Daily weights  - C/w coreg 6.25mg  BID - Replete Mag as needed - Keep O2 sat >88 - Trach O2 at  night and during day if patient amenable  Normocytic Anemia 2/2 anemia of chronic disease Hgb trend 8.2->8.9->8.0 - Trend CBC  T2DM CBGs 116s this AM. Required 2 extra aspart yesterday - C/w diabetic reigmen: Levemir 30 units qhs, novolog 6units TID qc - Glucose checks  - SSI  DVT prophx:subqheparin Diet:Renal/Diabetic w/ fluid restrict Bowel:Miralax Code:Full  Dispo: Anticipated discharge in  approximately 2-3 day(s).   Melody Anis, MD 06/17/2018, 1:30 PM Pager: [phone]

## 2018-06-17 NOTE — Progress Notes (Signed)
[facility] Progress Note   Subjective:     SCr improvd and UOP stable with some IVF yesterday  No SOB, on PM with trach collar  Has been given another 0.5L bolus this AM  No fevers, BP stable.    Objective Vitals:   06/17/18 0353 06/17/18 0531 06/17/18 0908 06/17/18 0917  BP:  (!) 145/68  (!) 153/73  Pulse:  80 86 82  Resp:  18 18   Temp:  98.8 F (37.1 C)    TempSrc:  Oral    SpO2:  91% 96% 98%  Weight: 99.5 kg     Height:       Physical Exam General: lying flat in bed comfortably Heart: RRR Lungs: normal WOB, no rales Abdomen: soft obese Extremities: LLE with 1+ nonpitting edema and healing fasciotomy scar  Additional Objective Labs: Basic Metabolic Panel: Recent Labs  Lab 06/15/18 0651 06/16/18 0710 06/17/18 0614  NA 136 139 141  K 5.0 4.8 4.3  CL 102 104 107  CO2 26 26 27   GLUCOSE 184* 130* 136*  BUN 59* 62* 61*  CREATININE 4.36* 4.46* 3.85*  CALCIUM 8.6* 9.1 8.6*  PHOS 6.2* 6.1* 4.9*   Liver Function Tests: Recent Labs  Lab 06/14/18 0105 06/15/18 0651 06/16/18 0710 06/17/18 0614  AST 43*  --   --   --   ALT 35  --   --   --   ALKPHOS 187*  --   --   --   BILITOT 0.3  --   --   --   PROT 6.6  --   --   --   ALBUMIN 2.8* 2.9* 3.0* 2.6*   No results for input(s): LIPASE, AMYLASE in the last 168 hours. CBC: Recent Labs  Lab 06/13/18 0721 06/14/18 0105 06/14/18 1120 06/15/18 0651 06/16/18 0710 06/17/18 0614  WBC 6.9 8.7 8.6 9.3 9.4 7.8  NEUTROABS 5.7  --   --   --   --   --   HGB 7.3* 6.9* 8.8* 8.2* 8.9* 8.0*  HCT 25.1* 24.1* 28.9* 27.4* 30.0* 27.4*  MCV 96.9 95.6 95.7 96.5 97.1 96.5  PLT 230 212 236 222 216 168   Blood Culture    Component Value Date/Time   SDES BLOOD RIGHT ANTECUBITAL 05/06/2018 1205   SPECREQUEST  05/06/2018 1205    BOTTLES DRAWN AEROBIC ONLY Blood Culture results may not be optimal due to an inadequate volume of blood received in culture bottles   CULT  05/06/2018 1205    NO GROWTH 5  DAYS Performed at [facility], [place] [address] [place], [place] [address]    REPTSTATUS 05/11/2018 FINAL 05/06/2018 1205    Cardiac Enzymes: Recent Labs  Lab 06/12/18 0509  TROPONINI <0.03   CBG: Recent Labs  Lab 06/16/18 1221 06/16/18 1240 06/16/18 1744 06/16/18 2114 06/17/18 0806  GLUCAP 67* 105* 148* 331* 116*   Iron Studies: No results for input(s): IRON, TIBC, TRANSFERRIN, FERRITIN in the last 72 hours. @lablastinr3 @ Studies/Results: No results found. Medications: . sodium chloride 500 mL (06/17/18 0925)   . atorvastatin  40 mg Oral Daily  . carvedilol  6.25 mg Oral BID  . heparin  5,000 Units Subcutaneous Q8H  . insulin aspart  0-9 Units Subcutaneous TID WC  . insulin aspart  7 Units Subcutaneous TID WC  . insulin detemir  30 Units Subcutaneous QHS  . pantoprazole  40 mg Oral BID  . sodium zirconium cyclosilicate  10 g Oral Daily  Assessment/Plan  **AKI on CKD:  CKD with baseline Cr 2.4 who is seen for AKI.  Possibly was overdiuresed / hypovolemic; improved with holding diuretics and gentle hydration.  --Will follow closely with you  **Hyperkalemia:  Resolved, monitor; stop Lokelma for now.   **Anemia: transfused 1u pRBC 2/15, stable in 8s.   Rec Aranesp.   **HFrEF: initially felt to be in decompensated state given dyspnea but in light of rising creatinine with diuresis may not have been the case.  Resp status stable.  40% FIO2  **Chronic hypoxic resp failure/ OSA: Chronic supplemental O2    **DM: per primary  Call with concerns. Will follow closely   Pearson Grippe MD

## 2018-06-17 NOTE — Progress Notes (Signed)
MEDICATION RELATED CONSULT NOTE - INITIAL   Pharmacy Consult for aranesp Indication: CKD  Allergies  Allergen Reactions  . Vicodin [Hydrocodone-Acetaminophen] Itching    Patient Measurements: Height: 5\' 1"  (154.9 cm) Weight: 219 lb 4.8 oz (99.5 kg) IBW/kg (Calculated) : 47.8  Vital Signs: Temp: 98.8 F (37.1 C) (02/17 0531) Temp Source: Oral (02/17 0531) BP: 153/73 (02/17 0917) Pulse Rate: 85 (02/17 1110) Intake/Output from previous day: 02/16 0701 - 02/17 0700 In: 1462.3 [P.O.:960; IV Piggyback:502.3] Out: 1200 [Urine:1200] Intake/Output from this shift: Total I/O In: 360 [P.O.:360] Out: -   Labs: Recent Labs    06/14/18 1715 06/15/18 0651 06/16/18 0710 06/17/18 0614  WBC  --  9.3 9.4 7.8  HGB  --  8.2* 8.9* 8.0*  HCT  --  27.4* 30.0* 27.4*  PLT  --  222 216 168  CREATININE  --  4.36* 4.46* 3.85*  LABCREA 120.30  --   --   --   MG  --  1.8  --  1.8  PHOS  --  6.2* 6.1* 4.9*  ALBUMIN  --  2.9* 3.0* 2.6*   Estimated Creatinine Clearance: 19.1 mL/min (A) (by C-G formula based on SCr of 3.85 mg/dL (H)).   Microbiology: No results found for this or any previous visit (from the past 720 hour(s)).  Medical History: Past Medical History:  Diagnosis Date  . Abnormal LFTs (liver function tests)    Liver U/S and exam c/w HSM. Hep B serology neg. but Hep C ab +, HIV neg. 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 Acute kidney injury (AKI) with acute tubular necrosis (ATN) (HCC)   . Acute on chronic respiratory failure with hypoxemia ([place]) 05/05/2014  . Acute renal failure superimposed on stage 4 chronic kidney disease ([place])   . Acute respiratory failure ([place])   . Acute respiratory failure with hypoxia ([place])   . AKI (acute kidney injury) ([place]) 05/10/2011  . Anemia   . Aspiration pneumonia ([place]) 11/22/2013  . Cardiac arrest ([place]) 05/03/2018  . Cardiomyopathy, nonischemic ([place])    a. Varying EF over the years - initially  35% in 09/2010. Normal cors 12/2010. b. Echo 07/2014: EF 55-60%, no RWMA, + diastolic flattening and systolic flattening c/w RV volume and pressure overload, mild LAE/RAE, mod dilated RV, mod TR, PASP severely increased at 67mmHg. c. RHC 07/2014: moderate pulmonary HTN likely WHO group 3 with markedly elevated CVP and relatively normal left sided pressures.  . Chronic diastolic CHF (congestive heart failure) ([place])   . Chronic respiratory failure ([place])    a. became O2 dependent in July 2011. b. She required trache placement in 07/2013 and has been followed by pulmonology as well.  . CKD (chronic kidney disease), stage II   . Complication of anesthesia    " difficult waking "  . Diabetes mellitus    insulin dependent  . Diabetic retinopathy    Right eye 2/11  . Difficult airway   . Dyspnea   . Essential hypertension   . Health maintenance examination    Mammogram 05/2010 Negative; Last Pap smear 03/2008; Last DM eye exam 2/11> mild non-proliferative diabetic retinopathy. OD  . Helicobacter pylori ab+ 05/2011   Pt was symptomatic and treatment planned for 05/2011  . Hx of cardiac cath 2/08   No CAD, no RAS,  normal EF  . Hypoxemia    CT angiogram 9/11>> No PE; PFTs 10/11- FEV1 1.20 (49%) with 16%  better p B2, DLCO 33%> corrects to 84; O2 sats ok on 4 lpm X rapid walk X 3 laps 05/2010  . Long term current use of systemic steroids   . Morbid obesity ([place])    Target wt= 153 for BMI <30  . MRSA bacteremia 05/06/2018  . Obesity hypoventilation syndrome ([place])   . Pulmonary hypertension ([place])   . 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 Very small pe vs artifact on L > venous dopplers 07/28/2013 neg bilaterally  - Echo 08/10/13 The RV was dialted and severely hyponetic and there was a D-shaped septum suggestive of PAH with RV  pressure volume overload.    . QT prolongation   . Sarcoidosis    Followed by Dr. Melvyn Novas; w/ liver involvement per biopsy 12/09, Reversible airway component so started on [facility] 01/2010; HFA 75% p coaching 05/2010  . Seborrheic dermatitis of scalp   . Sleep apnea   . Tobacco abuse    48 year old female with AoCKD, baseline scr of 2.4. We are currently seeing some improvement in her AKI with scr trending down from 4.4 to 3.8 this morning.   New orders received to start aranesp to assist with her anemia of chronic disease.   Patient received 57mcg of aranesp sq yesterday 2/16 dosed by renal. Will communicate with primary team that we can readdress dosing next week if needed. Renal will likely provide dosing recommendations at that time. Pharmacy will follow peripherally.   Needs iron studies but just received transfusion 2/14.   Erin Hearing PharmD., BCPS Clinical Pharmacist 06/17/2018 11:46 AM

## 2018-06-17 NOTE — Progress Notes (Signed)
  Date: 06/17/2018  Patient name: Melody Gray  Medical record number: [id number]  Date of birth: 1971/04/28   I have seen and evaluated this patient and I have discussed the plan of care with the house staff. Please see their note for complete details. I concur with their findings with the following additions/corrections:   Renal function has improved after gentle hydration yesterday, will continue today and watch volume status.   Lenice Pressman, M.D., Ph.D. 06/17/2018, 3:53 PM

## 2018-06-17 NOTE — Care Management Important Message (Signed)
Important Message  Patient Details  Name: Melody Gray MRN: [id number] Date of Birth: Aug 11, 1970   Medicare Important Message Given:  Yes    [name] 06/17/2018, 4:22 PM

## 2018-06-18 DIAGNOSIS — G4700 Insomnia, unspecified: Secondary | ICD-10-CM

## 2018-06-18 LAB — RENAL FUNCTION PANEL
Albumin: 2.7 g/dL — ABNORMAL LOW (ref 3.5–5.0)
Anion gap: 10 (ref 5–15)
BUN: 56 mg/dL — ABNORMAL HIGH (ref 6–20)
CO2: 26 mmol/L (ref 22–32)
Calcium: 9.1 mg/dL (ref 8.9–10.3)
Chloride: 105 mmol/L (ref 98–111)
Creatinine, Ser: 3.68 mg/dL — ABNORMAL HIGH (ref 0.44–1.00)
GFR calc Af Amer: 16 mL/min — ABNORMAL LOW (ref >60)
GFR calc non Af Amer: 14 mL/min — ABNORMAL LOW (ref >60)
Glucose, Bld: 118 mg/dL — ABNORMAL HIGH (ref 70–99)
Phosphorus: 4.5 mg/dL (ref 2.5–4.6)
Potassium: 3.9 mmol/L (ref 3.5–5.1)
Sodium: 141 mmol/L (ref 135–145)

## 2018-06-18 LAB — CBC
HCT: 28 % — ABNORMAL LOW (ref 36.0–46.0)
Hemoglobin: 8 g/dL — ABNORMAL LOW (ref 12.0–15.0)
MCH: 27.8 pg (ref 26.0–34.0)
MCHC: 28.6 g/dL — ABNORMAL LOW (ref 30.0–36.0)
MCV: 97.2 fL (ref 80.0–100.0)
Platelets: 165 10*3/uL (ref 150–400)
RBC: 2.88 MIL/uL — ABNORMAL LOW (ref 3.87–5.11)
RDW: 15.4 % (ref 11.5–15.5)
WBC: 7.2 10*3/uL (ref 4.0–10.5)
nRBC: 0.3 % — ABNORMAL HIGH (ref 0.0–0.2)

## 2018-06-18 LAB — GLUCOSE, CAPILLARY
Glucose-Capillary: 92 mg/dL (ref 70–99)
Glucose-Capillary: 87 mg/dL (ref 70–99)
Glucose-Capillary: 77 mg/dL (ref 70–99)
Glucose-Capillary: 113 mg/dL — ABNORMAL HIGH (ref 70–99)

## 2018-06-18 MED ORDER — SODIUM CHLORIDE 0.9 % IV SOLN
INTRAVENOUS | Status: AC
  Administered 2018-06-18: 07:00:00 via INTRAVENOUS

## 2018-06-18 NOTE — Progress Notes (Signed)
   Subjective:  Melody Gray is a 48 y.o. with PMH of HFpEF, trach-dependent OSH, HTN, CKD4, T2DM, and chronic respiratory failure on 2L O2 [place] admit for dyspnea on hospital day 5  Melody Gray was examined and evaluated at bedside this AM. She was more somnolent than yesterday. She states she had poor night of sleep with inability to fall asleep. She was awake until 5am this morning. She states she was just starting to fall asleep when we came in. She has no acute complaints at this time. Denies any significant respiratory distress, chest pain, palpitations.  Objective:  Vital signs in last 24 hours: Vitals:   06/17/18 2348 06/18/18 0338 06/18/18 0403 06/18/18 0935  BP:   (!) 142/64   Pulse: 81 80 75 85  Resp: 16 16 20 18   Temp:   98.4 F (36.9 C)   TempSrc:   Oral   SpO2: 92% 95% 98% 98%  Weight:   99.6 kg   Height:       Gen: Well-developed, well nourished, NAD Neck: tracheostomy in place w/ oxygen CV: RRR, S1, S2 normal, No rubs, no murmurs, no gallops Pulm: CTAB, minimal bibasilar rales, no wheezes Extm: ROM intact, Peripheral pulses intact, 2+ pitting edema bilateral lower extremities up to mid shin Skin: Dry, Warm, normal turgor Neuro: Appear more somnolent but AAOx3, able to follow directions  Assessment/Plan:  Principal Problem:   AKI (acute kidney injury) ([place]) Active Problems:   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 Her kidney function mildly improved after more fluid resuscitation but still far from baseline. Still having good urinary output. Oxygenation appears stable but somnolent this morning due to insomnia overnight.  Acute on chronic renal failure K trend 5.0->4.8->4.3->3.9. Creatinine trend 4.46->3.85->3.68 may be approaching new baseline. Baseline creatinine 2.38.  - C/w IV NS 50/hr for 10 hrs - Trend renal panel - Appreciate nephrology recs - Hold nephrotoxic meds  Dyspnea 2/2 Trach dependence and OHS 1.65L urine output overnight. 98% on 4L O2 - Strict I&Os - Daily weights  - C/w coreg 6.25mg  BID - Replete Mag as needed - Keep O2 sat >88 - Trach O2 at night and during day if patient amenable  Normocytic Anemia 2/2 anemia of chronic disease Hgb trend 8.2->8.9->8.0->8 - Trend CBC  T2DM CBG 92 this AM - C/w diabetic reigmen: Levemir 30 units qhs, novolog 7units TID qc - Glucose checks  - SSI  DVT prophx:subqheparin Diet:Renal/Diabetic w/ fluid restrict Bowel:Miralax Code:Full  Dispo: Anticipated discharge in approximately 2-3 day(s).   Melody Anis, MD 06/18/2018, 11:20 AM Pager: [phone]

## 2018-06-18 NOTE — Progress Notes (Signed)
[facility] Progress Note   Subjective:     SCr lower at 3.68 and 1.7L UOP   On NS @ 44mL/h  No SOB, on PM and using [place]  Less confusion today  Eating decenlty  Objective Vitals:   06/17/18 2348 06/18/18 0338 06/18/18 0403 06/18/18 0935  BP:   (!) 142/64   Pulse: 81 80 75 85  Resp: 16 16 20 18   Temp:   98.4 F (36.9 C)   TempSrc:   Oral   SpO2: 92% 95% 98% 98%  Weight:   99.6 kg   Height:       Physical Exam General: lying flat in bed comfortably Heart: RRR Lungs: normal WOB, no rales Abdomen: soft obese Extremities: LLE with 1+ nonpitting edema and healing fasciotomy scar  Additional Objective Labs: Basic Metabolic Panel: Recent Labs  Lab 06/16/18 0710 06/17/18 0614 06/18/18 0445  NA 139 141 141  K 4.8 4.3 3.9  CL 104 107 105  CO2 26 27 26   GLUCOSE 130* 136* 118*  BUN 62* 61* 56*  CREATININE 4.46* 3.85* 3.68*  CALCIUM 9.1 8.6* 9.1  PHOS 6.1* 4.9* 4.5   Liver Function Tests: Recent Labs  Lab 06/14/18 0105  06/16/18 0710 06/17/18 0614 06/18/18 0445  AST 43*  --   --   --   --   ALT 35  --   --   --   --   ALKPHOS 187*  --   --   --   --   BILITOT 0.3  --   --   --   --   PROT 6.6  --   --   --   --   ALBUMIN 2.8*   < > 3.0* 2.6* 2.7*   < > = values in this interval not displayed.   No results for input(s): LIPASE, AMYLASE in the last 168 hours. CBC: Recent Labs  Lab 06/13/18 0721  06/14/18 1120 06/15/18 0651 06/16/18 0710 06/17/18 0614 06/18/18 0445  WBC 6.9   < > 8.6 9.3 9.4 7.8 7.2  NEUTROABS 5.7  --   --   --   --   --   --   HGB 7.3*   < > 8.8* 8.2* 8.9* 8.0* 8.0*  HCT 25.1*   < > 28.9* 27.4* 30.0* 27.4* 28.0*  MCV 96.9   < > 95.7 96.5 97.1 96.5 97.2  PLT 230   < > 236 222 216 168 165   < > = values in this interval not displayed.   Blood Culture    Component Value Date/Time   SDES BLOOD RIGHT ANTECUBITAL 05/06/2018 1205   SPECREQUEST  05/06/2018 1205    BOTTLES DRAWN AEROBIC ONLY Blood Culture results may  not be optimal due to an inadequate volume of blood received in culture bottles   CULT  05/06/2018 1205    NO GROWTH 5 DAYS Performed at [facility], [place] [address] [place], [place] [address]    REPTSTATUS 05/11/2018 FINAL 05/06/2018 1205    Cardiac Enzymes: Recent Labs  Lab 06/12/18 0509  TROPONINI <0.03   CBG: Recent Labs  Lab 06/17/18 0806 06/17/18 1210 06/17/18 1623 06/17/18 2133 06/18/18 0809  GLUCAP 116* 99 116* 151* 92   Iron Studies: No results for input(s): IRON, TIBC, TRANSFERRIN, FERRITIN in the last 72 hours. @lablastinr3 @ Studies/Results: No results found. Medications: . sodium chloride 50 mL/hr at 06/18/18 0642   . atorvastatin  40 mg Oral Daily  . carvedilol  6.25 mg Oral BID  . heparin  5,000 Units Subcutaneous Q8H  . insulin aspart  0-9 Units Subcutaneous TID WC  . insulin aspart  7 Units Subcutaneous TID WC  . insulin detemir  30 Units Subcutaneous QHS  . pantoprazole  40 mg Oral BID   Assessment/Plan  **AKI on CKD:  CKD with baseline Cr 2.4 who is seen for AKI.  Possibly was overdiuresed / hypovolemic; improving with holding diuretics and gentle hydration.  --Will follow closely with you  **Hyperkalemia:  Resolved, monitor; now is off Lokelma  **Anemia: transfused 1u pRBC 2/15, stable in 8s.   Rec Aranesp.   **HFrEF: initially felt to be in decompensated state given dyspnea but in light of rising creatinine with diuresis may not have been the case.  Resp status stable.  On [place]  **Chronic hypoxic resp failure/ OSA: Chronic supplemental O2    **DM: per primary  Call with concerns. Will follow closely   Pearson Grippe MD

## 2018-06-18 NOTE — Progress Notes (Signed)
  Date: 06/18/2018  Patient name: Melody Gray  Medical record number: [id number]  Date of birth: 1971/03/09   I have seen and evaluated this patient and I have discussed the plan of care with the house staff. Please see their note for complete details. I concur with their findings with the following additions/corrections:   Renal function slightly improved from yesterday after another 500 cc of IV fluids. Still may be autodiuresing a fair amount with more than 1.6L of UOP and minimal PO intake recorded. Will continue gentle hydration today, hopefully she will continue to improve.   Lenice Pressman, M.D., Ph.D. 06/18/2018, 11:43 AM

## 2018-06-19 ENCOUNTER — Ambulatory Visit: Payer: Medicare Other | Admitting: Gastroenterology

## 2018-06-19 LAB — RENAL FUNCTION PANEL
Albumin: 2.6 g/dL — ABNORMAL LOW (ref 3.5–5.0)
Anion gap: 7 (ref 5–15)
BUN: 49 mg/dL — ABNORMAL HIGH (ref 6–20)
CO2: 29 mmol/L (ref 22–32)
Calcium: 9 mg/dL (ref 8.9–10.3)
Chloride: 106 mmol/L (ref 98–111)
Creatinine, Ser: 3.31 mg/dL — ABNORMAL HIGH (ref 0.44–1.00)
GFR calc Af Amer: 18 mL/min — ABNORMAL LOW (ref >60)
GFR calc non Af Amer: 16 mL/min — ABNORMAL LOW (ref >60)
Glucose, Bld: 154 mg/dL — ABNORMAL HIGH (ref 70–99)
Phosphorus: 3.9 mg/dL (ref 2.5–4.6)
Potassium: 3.9 mmol/L (ref 3.5–5.1)
Sodium: 142 mmol/L (ref 135–145)

## 2018-06-19 LAB — GLUCOSE, CAPILLARY
Glucose-Capillary: 105 mg/dL — ABNORMAL HIGH (ref 70–99)
Glucose-Capillary: 117 mg/dL — ABNORMAL HIGH (ref 70–99)
Glucose-Capillary: 120 mg/dL — ABNORMAL HIGH (ref 70–99)
Glucose-Capillary: 162 mg/dL — ABNORMAL HIGH (ref 70–99)

## 2018-06-19 LAB — CBC
HCT: 27.9 % — ABNORMAL LOW (ref 36.0–46.0)
Hemoglobin: 8.3 g/dL — ABNORMAL LOW (ref 12.0–15.0)
MCH: 28.8 pg (ref 26.0–34.0)
MCHC: 29.7 g/dL — ABNORMAL LOW (ref 30.0–36.0)
MCV: 96.9 fL (ref 80.0–100.0)
Platelets: 171 10*3/uL (ref 150–400)
RBC: 2.88 MIL/uL — ABNORMAL LOW (ref 3.87–5.11)
RDW: 15.5 % (ref 11.5–15.5)
WBC: 6.2 10*3/uL (ref 4.0–10.5)
nRBC: 0 % (ref 0.0–0.2)

## 2018-06-19 LAB — IRON AND TIBC
Iron: 56 ug/dL (ref 28–170)
Saturation Ratios: 17 % (ref 10.4–31.8)
TIBC: 323 ug/dL (ref 250–450)
UIBC: 267 ug/dL

## 2018-06-19 LAB — FERRITIN: Ferritin: 47 ng/mL (ref 11–307)

## 2018-06-19 MED ORDER — SODIUM CHLORIDE 0.9 % IV SOLN
INTRAVENOUS | Status: DC
  Administered 2018-06-19: 08:00:00 via INTRAVENOUS

## 2018-06-19 NOTE — Progress Notes (Signed)
Subjective:  Melody Gray is a 48 y.o. with PMH of HFpEF, trach-dependent OSH, HTN, CKD4, T2DM, and chronic respiratory failure on 2L O2 Melody Gray admit for dyspnea on hospital day 6  Melody Gray was examined and evaluated at bedside this AM. She appeared much more alert and awake this morning, and was observed resting comfortably in bed. She states her sister brought some Gatorade over for her to drink and she has been attempting to have good oral intake. She mentions that her daughter thought she was 'not her self' last night but she does not recall such episode. She mentions that she was experiencing another night of poor sleep with worsening orthopnea and increased lower extremity edema. Discussed plans for discharge tomorrow as her renal function may have recovered as much as possible with fluid resuscitation and now she maybe heading toward hypervolemia. Melody Gray expressed understanding and agrees with the plan.   Objective:  Vital signs in last 24 hours: Vitals:   06/19/18 0820 06/19/18 0852 06/19/18 1118 06/19/18 1150  BP: (!) 160/77   132/86  Pulse: 78 89 81 73  Resp:  18 18 18   Temp:    98.5 F (36.9 C)  TempSrc:    Oral  SpO2: 100% 96% 95% 100%  Weight:      Height:       Gen: Well-developed, Chronically ill-appearing, NAD Neck: tracheostomy in place w/ oxygen CV: RRR, S1, S2 normal, No rubs, no murmurs, no gallops Pulm: CTAB, bibasilar rales up to 1/3 thorax, no wheezing Extm: ROM intact, Peripheral pulses intact, 2+ pitting edema bilateral lower extremities up to upper shin Skin: Dry, Warm, normal turgor Neuro: AAOx3  Assessment/Plan:  Principal Problem:   AKI (acute kidney injury) (Melody Gray) Active Problems:   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 Kidney function continuing to improve with gentle fluid resuscitation but more edema and rales on exam. Considering her tenuous renal function and history of diastolic heart failure, hesitant to continue fluids. She is continuing to have more than adequate diuresis. Will monitor overnight for improvement with additional fluid resuscitation and possible d/c tomorrow.  Acute on chronic renal failure K trend 4.8->4.3->3.9->3.9. Creatinine trend 4.46->3.85->3.68->3.31 (Baseline creatinine 2.38)  - Stop IV NS 50/hr for 10 hrs - Trend renal panel - Appreciate nephrology recs: Getting iron panel today - Hold nephrotoxic meds  Dyspnea 2/2 Trach dependence and OHS 2.5L urine output overnight. 98% on 3L O2 - Strict I&Os - Daily weights  - C/w coreg 6.25mg  BID - Replete Mag as needed - Keep O2 sat >88 - Trach O2 at night and during day if patient amenable  Normocytic Anemia 2/2 anemia of chronic disease Hgb trend 8.2->8.9->8.0->8 - Trend CBC - F/u iron panel - IV Ferraheme prior to dc?  T2DM CBG 92 this AM - C/w diabetic reigmen: Levemir 30 units qhs, novolog 7units TID qc - Glucose checks  - SSI  DVT prophx:subqheparin Diet:Renal/Diabetic w/ fluid restrict Bowel:Miralax Code:Full  Dispo: Anticipated discharge in approximately 1-2 day(s).   [name], MD 06/19/2018, 1:44 PM Pager: [phone]

## 2018-06-19 NOTE — Progress Notes (Signed)
[facility] Progress Note   Subjective:      No interval events, no dyspnea  UOP 2.5 L  Serum creatinine 3.3, further improved.  Potassium and acid level stable.  Objective Vitals:   06/19/18 0626 06/19/18 0628 06/19/18 0820 06/19/18 0852  BP:  (!) 148/69 (!) 160/77   Pulse:  79 78 89  Resp:  18  18  Temp:  98.6 F (37 C)    TempSrc:  Oral    SpO2:  98% 100% 96%  Weight: 99.2 kg     Height:       Physical Exam General: lying flat in bed comfortably Heart: RRR Lungs: normal WOB, no rales Abdomen: soft obese Extremities: LLE with 1+ nonpitting edema and healing fasciotomy scar  Additional Objective Labs: Basic Metabolic Panel: Recent Labs  Lab 06/17/18 0614 06/18/18 0445 06/19/18 0354  NA 141 141 142  K 4.3 3.9 3.9  CL 107 105 106  CO2 27 26 29   GLUCOSE 136* 118* 154*  BUN 61* 56* 49*  CREATININE 3.85* 3.68* 3.31*  CALCIUM 8.6* 9.1 9.0  PHOS 4.9* 4.5 3.9   Liver Function Tests: Recent Labs  Lab 06/14/18 0105  06/17/18 0614 06/18/18 0445 06/19/18 0354  AST 43*  --   --   --   --   ALT 35  --   --   --   --   ALKPHOS 187*  --   --   --   --   BILITOT 0.3  --   --   --   --   PROT 6.6  --   --   --   --   ALBUMIN 2.8*   < > 2.6* 2.7* 2.6*   < > = values in this interval not displayed.   No results for input(s): LIPASE, AMYLASE in the last 168 hours. CBC: Recent Labs  Lab 06/13/18 0721  06/15/18 0651 06/16/18 0710 06/17/18 0614 06/18/18 0445 06/19/18 0354  WBC 6.9   < > 9.3 9.4 7.8 7.2 6.2  NEUTROABS 5.7  --   --   --   --   --   --   HGB 7.3*   < > 8.2* 8.9* 8.0* 8.0* 8.3*  HCT 25.1*   < > 27.4* 30.0* 27.4* 28.0* 27.9*  MCV 96.9   < > 96.5 97.1 96.5 97.2 96.9  PLT 230   < > 222 216 168 165 171   < > = values in this interval not displayed.   Blood Culture    Component Value Date/Time   SDES BLOOD RIGHT ANTECUBITAL 05/06/2018 1205   SPECREQUEST  05/06/2018 1205    BOTTLES DRAWN AEROBIC ONLY Blood Culture results may  not be optimal due to an inadequate volume of blood received in culture bottles   CULT  05/06/2018 1205    NO GROWTH 5 DAYS Performed at [facility], [place] [address], [place],  [address]    REPTSTATUS 05/11/2018 FINAL 05/06/2018 1205    Cardiac Enzymes: No results for input(s): CKTOTAL, CKMB, CKMBINDEX, TROPONINI in the last 168 hours. CBG: Recent Labs  Lab 06/18/18 0809 06/18/18 1227 06/18/18 1635 06/18/18 2133 06/19/18 0807  GLUCAP 92 87 77 113* 105*   Iron Studies: No results for input(s): IRON, TIBC, TRANSFERRIN, FERRITIN in the last 72 hours. @lablastinr3 @ Studies/Results: No results found. Medications: . sodium chloride 50 mL/hr at 06/19/18 0817   . atorvastatin  40 mg Oral Daily  . carvedilol  6.25 mg  Oral BID  . heparin  5,000 Units Subcutaneous Q8H  . insulin aspart  0-9 Units Subcutaneous TID WC  . insulin aspart  7 Units Subcutaneous TID WC  . insulin detemir  30 Units Subcutaneous QHS  . pantoprazole  40 mg Oral BID   Assessment/Plan  **AKI on CKD:  CKD with baseline Cr 2.4.  Possibly was overdiuresed / hypovolemic; improving with holding diuretics and gentle hydration.   Much improved.    **Hyperkalemia:  Resolved, monitor; now is off [place]  **Anemia: transfused 1u pRBC 2/15, stable in 8s.   Rec Aranesp 60 mcg on 2/16.  No recent iron studies, will add on today.  **HFrEF: initially felt to be in decompensated state given dyspnea but in light of rising creatinine with diuresis may not have been the case.  Resp status stable.  On [place].  Not on diuretics at time of admission, likely will require low-dose furosemide at least discharge  **Chronic hypoxic resp failure/ OSA: Chronic supplemental O2    **DM: per primary  Pending improvement in renal function, excellent urine output, overall improved status with sign off at the current time.  I will arrange follow-up in the office at [facility].  Call with any questions or concerns.  Pearson Grippe MD

## 2018-06-19 NOTE — Progress Notes (Signed)
  Date: 06/19/2018  Patient name: Melody Gray  Medical record number: [id number]  Date of birth: [birth year]   I have seen and evaluated this patient and I have discussed the plan of care with the house staff. Please see their note for complete details. I concur with their findings with the following additions/corrections:   Creatinine continues to improve, good urine output.  However, she is beginning to look slightly volume overloaded today, with JVD sitting upright, crackles in both lung bases, and increasing swelling in her legs.  As result, we will hold further fluid administration and recheck her creatinine in the morning.  If it has continued to improve, we will discharge her home.  However, I am concerned that she may develop worsening volume overload and we may be in a delicate situation as far as fluid balance for her.  Lenice Pressman, M.D., Ph.D. 06/19/2018, 4:02 PM

## 2018-06-20 DIAGNOSIS — Z6841 Body Mass Index (BMI) 40.0 and over, adult: Secondary | ICD-10-CM

## 2018-06-20 LAB — RENAL FUNCTION PANEL
Albumin: 2.6 g/dL — ABNORMAL LOW (ref 3.5–5.0)
Anion gap: 8 (ref 5–15)
BUN: 42 mg/dL — ABNORMAL HIGH (ref 6–20)
CO2: 26 mmol/L (ref 22–32)
Calcium: 9 mg/dL (ref 8.9–10.3)
Chloride: 107 mmol/L (ref 98–111)
Creatinine, Ser: 3.02 mg/dL — ABNORMAL HIGH (ref 0.44–1.00)
GFR calc Af Amer: 20 mL/min — ABNORMAL LOW (ref >60)
GFR calc non Af Amer: 17 mL/min — ABNORMAL LOW (ref >60)
Glucose, Bld: 157 mg/dL — ABNORMAL HIGH (ref 70–99)
Phosphorus: 3.8 mg/dL (ref 2.5–4.6)
Potassium: 4 mmol/L (ref 3.5–5.1)
Sodium: 141 mmol/L (ref 135–145)

## 2018-06-20 LAB — GLUCOSE, CAPILLARY
Glucose-Capillary: 136 mg/dL — ABNORMAL HIGH (ref 70–99)
Glucose-Capillary: 110 mg/dL — ABNORMAL HIGH (ref 70–99)

## 2018-06-20 LAB — CBC
HCT: 29.3 % — ABNORMAL LOW (ref 36.0–46.0)
Hemoglobin: 8.4 g/dL — ABNORMAL LOW (ref 12.0–15.0)
MCH: 27.7 pg (ref 26.0–34.0)
MCHC: 28.7 g/dL — ABNORMAL LOW (ref 30.0–36.0)
MCV: 96.7 fL (ref 80.0–100.0)
Platelets: 157 10*3/uL (ref 150–400)
RBC: 3.03 MIL/uL — ABNORMAL LOW (ref 3.87–5.11)
RDW: 15.6 % — ABNORMAL HIGH (ref 11.5–15.5)
WBC: 6.7 10*3/uL (ref 4.0–10.5)
nRBC: 0.3 % — ABNORMAL HIGH (ref 0.0–0.2)

## 2018-06-20 MED ORDER — FERROUS SULFATE 325 (65 FE) MG PO TABS: 325.0000 mg | ORAL_TABLET | Freq: Every day | ORAL | 0 refills | Status: AC

## 2018-06-20 MED ORDER — INSULIN DETEMIR 100 UNIT/ML ~~LOC~~ SOLN: 46.0000 [IU] | Freq: Every day | SUBCUTANEOUS | Status: AC

## 2018-06-20 NOTE — Progress Notes (Signed)
   Subjective:  Tess Yamina Lenis is a 48 y.o. with PMH of HFpEF, trach-dependent OSH, HTN, CKD4, T2DM, and chronic respiratory failure on 2L O2 [place] admit for dyspnea on hospital day 7  Ms.Lucia was examined and evaluated at bedside this AM. She was observed sitting comfortably in bed. She states she had concerns about using the trach collar at home and having humidifier at home. She stated she wanted to speak with Laurey Arrow, the trach clinic provider. Informed Ms.Slagel that he is in [place] and will not be able to visit her in the hospital today. She is agreeable to speaking with the inpatient respiratory therapist to address her concerns. She is aware that she can reach out to her home health agency for management of her oxygen equipment at home. Denies any significant respiratory distress this am. States she noted her lower extremity swelling has improved.  Objective:  Vital signs in last 24 hours: Vitals:   06/20/18 0459 06/20/18 0502 06/20/18 0837 06/20/18 0858  BP:  (!) 149/80 (!) 153/85   Pulse:  81 85 89  Resp:  18  18  Temp:  98.3 F (36.8 C)    TempSrc:  Oral    SpO2:  100%  96%  Weight: 99.2 kg     Height:       Gen: Well-developed, Chronically ill-appearing, NAD Neck: tracheostomy in place w/ oxygen CV: RRR, S1, S2 normal, No rubs, no murmurs, no gallops Pulm: CTAB, bibasilar rales, no wheezing Extm: ROM intact, Peripheral pulses intact, 2+ pitting edema bilateral lower extremities. 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 She is currently saturating 99 on 2L which is her baseline oxygen requirement. Her renal function continues to improve without IV fluid resuscitation. Will discharge home today.  Acute on chronic renal failure K trend 4.8->4.3->3.9->3.9->4.0. Creatinine trend 3.85->3.68->3.31->3.02 (Baseline creatinine 2.38)  - Appreciate nephrology recs:  - Hold nephrotoxic meds - Discharge home today  Dyspnea 2/2 Trach dependence and OHS Stable on home oxygen 2L 99-98% - Strict I&Os - Daily weights  - C/w coreg 6.25mg  BID - Replete Mag as needed - Keep O2 sat >88 - Trach O2 at night and during day if patient amenable  Normocytic Anemia 2/2 anemia of chronic disease Hgb trend 8.9->8.0->8.3->8.4 Iron sat 17%, TIBC 323, Ferritin 47 - Trend CBC - Oral ferrous sulfate on discharge  DVT prophx:subqheparin Diet:Renal/Diabetic w/ fluid restrict Bowel:Miralax Code:Full  Dispo: Anticipated discharge in approximately today(s).   [name], MD 06/20/2018, 10:46 AM Pager: [phone]

## 2018-06-20 NOTE — Discharge Instructions (Signed)
Dear Melody Gray,  You came to [name] with complaints of shortness of breath. We have determined this was caused by fluid in your lungs. Here are our recommendations for you at discharge:  - Please continue to use your home oxygen especially at night with trach - Please start taking ferrous sulfate 325mg  tablet with breakfast - Please take your home medications as prescribed - The kidney clinic will give you a call and let you know when to come in for a follow up visits - Please follow up with [name] in the clinic at: 06/25/18 9:45 AM  Thank you for choosing [facility].  Acute Kidney Injury, Adult  Acute kidney injury is a sudden worsening of kidney function. The kidneys are organs that have several jobs. They filter the blood to remove waste products and extra fluid. They also maintain a healthy balance of minerals and hormones in the body, which helps control blood pressure and keep bones strong. With this condition, your kidneys do not do their jobs as well as they should. This condition ranges from mild to severe. Over time it may develop into long-lasting (chronic) kidney disease. Early detection and treatment may prevent acute kidney injury from developing into a chronic condition. What are the causes? Common causes of this condition include:  A problem with blood flow to the kidneys. This may be caused by: ? Low blood pressure (hypotension) or shock. ? Blood loss. ? Heart and blood vessel (cardiovascular) disease. ? Severe burns. ? Liver disease.  Direct damage to the kidneys. This may be caused by: ? Certain medicines. ? A kidney infection. ? Poisoning. ? Being around or in contact with toxic substances. ? A surgical wound. ? A hard, direct hit to the kidney area.  A sudden blockage of urine flow. This may be caused by: ? Cancer. ? Kidney stones. ? An enlarged prostate in males. What are the signs or symptoms? Symptoms of this condition may not be obvious until the  condition becomes severe. Symptoms of this condition can include:  Tiredness (lethargy), or difficulty staying awake.  Nausea or vomiting.  Swelling (edema) of the face, legs, ankles, or feet.  Problems with urination, such as: ? Abdominal pain, or pain along the side of your stomach (flank). ? Decreased urine production. ? Decrease in the force of urine flow.  Muscle twitches and cramps, especially in the legs.  Confusion or trouble concentrating.  Loss of appetite.  Fever. How is this diagnosed? This condition may be diagnosed with tests, including:  Blood tests.  Urine tests.  Imaging tests.  A test in which a sample of tissue is removed from the kidneys to be examined under a microscope (kidney biopsy). How is this treated? Treatment for this condition depends on the cause and how severe the condition is. In mild cases, treatment may not be needed. The kidneys may heal on their own. In more severe cases, treatment will involve:  Treating the cause of the kidney injury. This may involve changing any medicines you are taking or adjusting your dosage.  Fluids. You may need specialized IV fluids to balance your body's needs.  Having a catheter placed to drain urine and prevent blockages.  Preventing problems from occurring. This may mean avoiding certain medicines or procedures that can cause further injury to the kidneys. In some cases treatment may also require:  A procedure to remove toxic wastes from the body (dialysis or continuous renal replacement therapy - CRRT).  Surgery. This may be  done to repair a torn kidney, or to remove the blockage from the urinary system. Follow these instructions at home: Medicines  Take over-the-counter and prescription medicines only as told by your health care provider.  Do not take any new medicines without your health care provider's approval. Many medicines can worsen your kidney damage.  Do not take any vitamin and mineral  supplements without your health care provider's approval. Many nutritional supplements can worsen your kidney damage. Lifestyle  If your health care provider prescribed changes to your diet, follow them. You may need to decrease the amount of protein you eat.  Achieve and maintain a healthy weight. If you need help with this, ask your health care provider.  Start or continue an exercise plan. Try to exercise at least 30 minutes a day, 5 days a week.  Do not use any tobacco products, such as cigarettes, chewing tobacco, and e-cigarettes. If you need help quitting, ask your health care provider. General instructions  Keep track of your blood pressure. Report changes in your blood pressure as told by your health care provider.  Stay up to date with immunizations. Ask your health care provider which immunizations you need.  Keep all follow-up visits as told by your health care provider. This is important. Where to find more information  American Association of Kidney Patients: BombTimer.gl  National Kidney Foundation: www.kidney.[place]: https://mathis.com/  Life Options [facility]: ? www.lifeoptions.org ? www.kidneyschool.org Contact a health care provider if:  Your symptoms get worse.  You develop new symptoms. Get help right away if:  You develop symptoms of worsening kidney disease, which include: ? Headaches. ? Abnormally dark or light skin. ? Easy bruising. ? Frequent hiccups. ? Chest pain. ? Shortness of breath. ? End of menstruation in women. ? Seizures. ? Confusion or altered mental status. ? Abdominal or back pain. ? Itchiness.  You have a fever.  Your body is producing less urine.  You have pain or bleeding when you urinate. Summary  Acute kidney injury is a sudden worsening of kidney function.  Acute kidney injury can be caused by problems with blood flow to the kidneys, direct damage to the kidneys, and sudden blockage of  urine flow.  Symptoms of this condition may not be obvious until it becomes severe. Symptoms may include edema, lethargy, confusion, nausea or vomiting, and problems passing urine.  This condition can usually be diagnosed with blood tests, urine tests, and imaging tests. Sometimes a kidney biopsy is done to diagnose this condition.  Treatment for this condition often involves treating the underlying cause. It is treated with fluids, medicines, dialysis, diet changes, or surgery. This information is not intended to replace advice given to you by your health care provider. Make sure you discuss any questions you have with your health care provider. Document Released: 10/31/2010 Document Revised: 08/17/2016 Document Reviewed: 04/07/2016 Elsevier Interactive Patient Education  2019 [employer].

## 2018-06-20 NOTE — Care Management Important Message (Signed)
Important Message  Patient Details  Name: Carlisha Wisler MRN: [id number] Date of Birth: 04/11/1971   Medicare Important Message Given:  Yes    Juniper Cobey P Goehner 06/20/2018, 11:10 AM

## 2018-06-20 NOTE — Discharge Summary (Signed)
Name: Melody Gray MRN: [id number] DOB: 1970/11/09 48 y.o. PCP: Neva Seat, MD  Date of Admission: 06/12/2018  3:12 AM Date of Discharge: 06/20/2018 10:00 AM Attending Physician: Lenice Pressman, MD  Discharge Diagnosis: 1. Acute Kidney Injury on Chronic Kidney Disease 2. Chronic Hypoxic Respiratory Failure 3. Anemia of chronic disease  Discharge Medications: Allergies as of 06/20/2018      Reactions   Vicodin [hydrocodone-acetaminophen] Itching      Medication List    STOP taking these medications   patiromer 8.4 g packet Commonly known as:  VELTASSA     TAKE these medications   amLODipine 10 MG tablet Commonly known as:  NORVASC Take 1 tablet (10 mg total) by mouth daily.   atorvastatin 40 MG tablet Commonly known as:  LIPITOR Take 1 tablet (40 mg total) by mouth daily.   BD INSULIN SYRINGE U/F 30G X 1/2" 0.5 ML Misc Generic drug:  Insulin Syringe-Needle U-100 USE AS DIRECTED   carvedilol 6.25 MG tablet Commonly known as:  COREG Take 1 tablet (6.25 mg total) by mouth 2 (two) times daily.   ferrous sulfate 325 (65 FE) MG tablet Take 1 tablet (325 mg total) by mouth daily with breakfast.   glipiZIDE 5 MG tablet Commonly known as:  GLUCOTROL Take 0.5 tablets (2.5 mg total) by mouth daily before breakfast.   glucose blood test strip Use to check blood sugar 3 times daily. Dx code E11.65. Insulin dependent   insulin detemir 100 UNIT/ML injection Commonly known as:  LEVEMIR Inject 0.46 mLs (46 Units total) into the skin at bedtime. ADMINISTER 50 UNITS UNDER THE SKIN AT BEDTIME   Insulin Pen Needle 31G X 5 MM Misc Commonly known as:  B-D UF III MINI PEN NEEDLES USE TWICE A DAY   Melatonin 3 MG Tabs Take 3 mg by mouth at bedtime as needed (sleep).   OXYGEN Inhale 2 L into the lungs continuous.   pantoprazole 40 MG tablet Commonly known as:  PROTONIX Take 1 tablet (40 mg total) by mouth 2 (two) times daily.   VICTOZA 18 MG/3ML  Sopn Generic drug:  liraglutide INJECT 1.8 MILLIGRAM SUBCUTANEOUSLY DAILY What changed:  See the new instructions.       Disposition and follow-up:   Melody Gray was discharged from [facility] in Stable condition.  At the hospital follow up visit please address:  1. Acute Kidney Injury on Chronic Kidney Disease - Please check BMP to assess her renal function and electrolyte levels - Please ensure she has follow up with her nephrologist  2. Chronic Hypoxic Respiratory Failure - Please ensure she has all of her trach supplies at home  3. Anemia of chronic disease - Please check CBC to monitor her hemoglobin level - Consider increasing her iron supplement dose if she is able to tolerate  4.  Labs / imaging needed at time of follow-up: cbc, bmp  5.  Pending labs/ test needing follow-up: N/A  Follow-up Appointments: [facility].   Why:  They will continue to do your home health care at your home Contact information: [place] [address] [phone]        Justin Mend, MD Follow up.   Specialty:  Internal Medicine Why:  You will be contacted with instructions for follow-up Contact information: [place] [address] [phone]        [facility]. Go on 06/25/2018.   Why:  9:45 AM Contact information: [address] [place] Hospital Course by problem list: 1. Acute Kidney Injury on Chronic Kidney Disease: Melody Gray is a 48 yo F with PMH of HFpEF, trach-dependent OSH, HTN, CKD4, T2DM, and chronic respiratory failure on 2L O2 [place] presenting with dyspnea. Her chest X-ray showed significant pulmonary edema and she was given IV furosemide 80mg  BID. Her renal functioned worsened with creatinine rising from baseline of 2.4-2.6 to 43 with potassium rising to 6.4. She was given  insulin aspart, started on [facility] and nephrology was consulted. Renal ultrasound showed chronic medical renal disease without hydronephrosis. Diuretics were stopped and her renal function slowly improved with good urinary output. 500cc bolus fluid trial was performed which continued to show improvement in renal function. Her potassium level stabilized around 4.0 and her creatinine continued to downtrend slowly. She was discharged at creatinine of 3.02 with recommendation to follow up closely with nephrology.  2. Chronic Hypoxic Respiratory Failure: Presented with history of chronic hypoxic respiratory failure at 5L home O2. Initially had fluctuating oxygen requirements especially at night time with recommendation to continue to use trach collar despite patient preference not to. Oxygen requirement stabilized at home O2 after good urinary output and cessation of fluid resuscitation. Discharged with recommendation to continue trach collar use.  3. Anemia of chronic disease: Presented with hemoglobin at baseline of 8. Trended down to 6.9 with diuresis. Received 1 unit of pRBC. Hemoglobin stabilized at 8.0 and dose of Aranesp was given. Iron labs were drawn showing TIBC of 323, Sat of 17%, Ferritin of 47. Discharged w/ recommendation to start ferrous sulfate 325mg  daily.  Discharge Vitals:   BP (!) 153/85   Pulse 79   Temp 98.3 F (36.8 C) (Oral)   Resp 18   Ht 5\' 1"  (1.549 m)   Wt 99.2 kg   SpO2 99%   BMI 41.32 kg/m   Pertinent Labs, Studies, and Procedures:  BMP Latest Ref Rng & Units 06/20/2018 06/19/2018 06/18/2018  Glucose 70 - 99 mg/dL 157(H) 154(H) 118(H)  BUN 6 - 20 mg/dL 42(H) 49(H) 56(H)  Creatinine 0.44 - 1.00 mg/dL 3.02(H) 3.31(H) 3.68(H)  BUN/Creat Ratio 9 - 23 - - -  Sodium 135 - 145 mmol/L 141 142 141  Potassium 3.5 - 5.1 mmol/L 4.0 3.9 3.9  Chloride 98 - 111 mmol/L 107 106 105  CO2 22 - 32 mmol/L 26 29 26   Calcium 8.9 - 10.3 mg/dL 9.0 9.0 9.1   CBC Latest Ref Rng & Units  06/20/2018 06/19/2018 06/18/2018  WBC 4.0 - 10.5 K/uL 6.7 6.2 7.2  Hemoglobin 12.0 - 15.0 g/dL 8.4(L) 8.3(L) 8.0(L)  Hematocrit 36.0 - 46.0 % 29.3(L) 27.9(L) 28.0(L)  Platelets 150 - 400 K/uL 157 171 165   CHEST - 2 VIEW  COMPARISON:  Multiple prior including [id number]  FINDINGS: Cardiomediastinal silhouette unchanged.  Unchanged position of tracheostomy.  Interlobular septal thickening with fullness in the central vasculature. Opacity at the left lung base. Blunting of the costophrenic sulcus on the lateral view. Thickening of the fissures.  IMPRESSION: Similar appearance of the chest x-ray with low lung volumes, bibasilar opacities, potentially combination of edema, consolidation/atelectasis, left greater than right pleural effusion.  RENAL / URINARY TRACT ULTRASOUND COMPLETE  COMPARISON:  None.  FINDINGS: Right Kidney:  Renal measurements: 12.0 x 7.8 x 5.6 cm = volume: 272 mL. Mildly increased echotexture. 1.9 cm  cyst in the lower pole. No hydronephrosis.  Left Kidney:  Renal measurements: 11.1 x 6.4 x 4.5 cm = volume: 164 mL. 2.7 cm cyst in the midpole. Increased echotexture. No hydronephrosis.  Bladder:  Appears normal for degree of bladder distention.  IMPRESSION: Increased echotexture in the kidneys bilaterally compatible with chronic medical renal disease.  Small bilateral renal cysts.  No acute findings.  No hydronephrosis.    Discharge Instructions: Dear Melody Gray,  You came to [name] with complaints of shortness of breath. We have determined this was caused by fluid in your lungs. Here are our recommendations for you at discharge:  - Please continue to use your home oxygen especially at night with trach - Please start taking ferrous sulfate 325mg  tablet with breakfast - Please take your home medications as prescribed - The kidney clinic will give you a call and let you know when to come in for a follow up visits -  Please follow up with [name] in the clinic at: 06/25/18 9:45 AM  Thank you for choosing [facility].  Discharge Instructions    Call MD for:  difficulty breathing, headache or visual disturbances   Complete by:  As directed    Call MD for:  extreme fatigue   Complete by:  As directed    Call MD for:  persistant dizziness or light-headedness   Complete by:  As directed    Diet - low sodium heart healthy   Complete by:  As directed    Increase activity slowly   Complete by:  As directed       Signed: [name], MD 06/23/2018, 7:46 PM   Pager: [phone]

## 2018-06-21 ENCOUNTER — Other Ambulatory Visit: Payer: Self-pay

## 2018-06-21 DIAGNOSIS — Z4789 Encounter for other orthopedic aftercare: Secondary | ICD-10-CM | POA: Diagnosis not present

## 2018-06-21 DIAGNOSIS — D860 Sarcoidosis of lung: Secondary | ICD-10-CM | POA: Diagnosis not present

## 2018-06-21 DIAGNOSIS — I272 Pulmonary hypertension, unspecified: Secondary | ICD-10-CM | POA: Diagnosis not present

## 2018-06-21 DIAGNOSIS — E11319 Type 2 diabetes mellitus with unspecified diabetic retinopathy without macular edema: Secondary | ICD-10-CM | POA: Diagnosis not present

## 2018-06-21 DIAGNOSIS — J9611 Chronic respiratory failure with hypoxia: Secondary | ICD-10-CM | POA: Diagnosis not present

## 2018-06-21 DIAGNOSIS — Z9981 Dependence on supplemental oxygen: Secondary | ICD-10-CM | POA: Diagnosis not present

## 2018-06-21 DIAGNOSIS — D869 Sarcoidosis, unspecified: Secondary | ICD-10-CM

## 2018-06-24 ENCOUNTER — Ambulatory Visit (INDEPENDENT_AMBULATORY_CARE_PROVIDER_SITE_OTHER): Payer: Medicare Other | Admitting: Internal Medicine

## 2018-06-24 ENCOUNTER — Other Ambulatory Visit: Payer: Self-pay

## 2018-06-24 VITALS — BP 138/69 | HR 97 | Temp 98.2°F | Ht 61.0 in | Wt 216.1 lb

## 2018-06-24 DIAGNOSIS — Z8701 Personal history of pneumonia (recurrent): Secondary | ICD-10-CM

## 2018-06-24 DIAGNOSIS — D649 Anemia, unspecified: Secondary | ICD-10-CM | POA: Diagnosis not present

## 2018-06-24 DIAGNOSIS — Z930 Tracheostomy status: Secondary | ICD-10-CM

## 2018-06-24 DIAGNOSIS — I130 Hypertensive heart and chronic kidney disease with heart failure and stage 1 through stage 4 chronic kidney disease, or unspecified chronic kidney disease: Secondary | ICD-10-CM

## 2018-06-24 DIAGNOSIS — R222 Localized swelling, mass and lump, trunk: Secondary | ICD-10-CM | POA: Diagnosis not present

## 2018-06-24 DIAGNOSIS — Z9889 Other specified postprocedural states: Secondary | ICD-10-CM

## 2018-06-24 DIAGNOSIS — I2721 Secondary pulmonary arterial hypertension: Secondary | ICD-10-CM

## 2018-06-24 DIAGNOSIS — I5032 Chronic diastolic (congestive) heart failure: Secondary | ICD-10-CM

## 2018-06-24 DIAGNOSIS — Z6841 Body Mass Index (BMI) 40.0 and over, adult: Secondary | ICD-10-CM

## 2018-06-24 DIAGNOSIS — N189 Chronic kidney disease, unspecified: Secondary | ICD-10-CM | POA: Diagnosis not present

## 2018-06-24 DIAGNOSIS — Z9981 Dependence on supplemental oxygen: Secondary | ICD-10-CM

## 2018-06-24 DIAGNOSIS — D869 Sarcoidosis, unspecified: Secondary | ICD-10-CM

## 2018-06-24 DIAGNOSIS — K7460 Unspecified cirrhosis of liver: Secondary | ICD-10-CM | POA: Diagnosis not present

## 2018-06-24 DIAGNOSIS — E1122 Type 2 diabetes mellitus with diabetic chronic kidney disease: Secondary | ICD-10-CM

## 2018-06-24 DIAGNOSIS — E662 Morbid (severe) obesity with alveolar hypoventilation: Secondary | ICD-10-CM

## 2018-06-24 DIAGNOSIS — J9611 Chronic respiratory failure with hypoxia: Secondary | ICD-10-CM

## 2018-06-24 DIAGNOSIS — E785 Hyperlipidemia, unspecified: Secondary | ICD-10-CM | POA: Diagnosis not present

## 2018-06-24 DIAGNOSIS — Z794 Long term (current) use of insulin: Secondary | ICD-10-CM

## 2018-06-24 DIAGNOSIS — Z8719 Personal history of other diseases of the digestive system: Secondary | ICD-10-CM

## 2018-06-24 DIAGNOSIS — I428 Other cardiomyopathies: Secondary | ICD-10-CM

## 2018-06-24 DIAGNOSIS — J90 Pleural effusion, not elsewhere classified: Secondary | ICD-10-CM

## 2018-06-24 DIAGNOSIS — F17211 Nicotine dependence, cigarettes, in remission: Secondary | ICD-10-CM

## 2018-06-24 DIAGNOSIS — R0602 Shortness of breath: Secondary | ICD-10-CM | POA: Diagnosis not present

## 2018-06-24 DIAGNOSIS — N179 Acute kidney failure, unspecified: Secondary | ICD-10-CM

## 2018-06-24 DIAGNOSIS — Z8679 Personal history of other diseases of the circulatory system: Secondary | ICD-10-CM

## 2018-06-24 DIAGNOSIS — Z8674 Personal history of sudden cardiac arrest: Secondary | ICD-10-CM

## 2018-06-24 DIAGNOSIS — N2889 Other specified disorders of kidney and ureter: Secondary | ICD-10-CM

## 2018-06-24 DIAGNOSIS — R140 Abdominal distension (gaseous): Secondary | ICD-10-CM | POA: Insufficient documentation

## 2018-06-24 DIAGNOSIS — Z79899 Other long term (current) drug therapy: Secondary | ICD-10-CM

## 2018-06-24 MED ORDER — SIMETHICONE 80 MG PO CHEW: 80.0000 mg | CHEWABLE_TABLET | Freq: Four times a day (QID) | ORAL | 2 refills | Status: AC | PRN

## 2018-06-24 NOTE — Assessment & Plan Note (Addendum)
Patient with mild shortness of breath found to be hypoxic to 75 on her home 2L [place] - this improved to 91 on 4L [place]. Patient with extensive history of sarcoid with PAH, OSA, obesity hypoventilation syndrome and several recent hospitalization with major morbidities of cardiac arrest, acute limb ischemia, gastric ulcer.   POCUS today revealed simple, mild left pleural effusion ~2cm in depth and minimal right sided pleural effusion; would not expect these to be contributing to her signs and symptoms.   -Major concern for her with recent hospitalizations, hypoxia and possibly blood tinged phlegm is PE. She is also in the process of workup for a left renal mass, however has not been able to obtain an MRI yet. -She denies s/sx that would be consistent with pneumonia. Overall fluid status is stable from initial hospitalization and with her significant AKI with minor diuresis in the hospital would not attempt to diurese unless clearly volume overloaded.  -Other consideration is worsening of her PAH; she has not been on steroid for sarcoidosis for years per patient and has stopped following up with pulmonology and cardiology - she is willing to re-establish but is worried about the amount of appointments as she is already requiring frequent follow up for hospitalizations and her worsening renal function. -She also has chronic anemia and required 1u pRBCs this last hospitalization; if she is significantly anemic she can have symptoms of dyspnea and objective hypoxia.  Plan: --f/u CBC --f/u VQ scan to assess for PE as CTA would be very high risk for her in setting of still recovering from recent AKI, and VQ scan can also be more helpful in finding chronic PEs, f/u CXR --increase to 4L ; she will contact her DME company to help set up her humidifier so she can use O2 per trach at night --if current work up negative and no new symtpoms, consider un-resolving pneumonia (in January she had LLL pneumonia with some  infiltrates still seen earlier this month; at this time don't suspect it is worsening and it is not yet time for repeat CXR so will hold off) while also considering worsening PAH from sarcoidosis

## 2018-06-24 NOTE — Assessment & Plan Note (Signed)
Patient with AKI during recent hospitalization thought to be pre-renal in setting of diuresis. Peak Cr was 4.46 and at discharge Cr was 3.0 (previous b/l ~2.5).  Plan: --f/u Bmet --patient has nephrology appt next week

## 2018-06-24 NOTE — Patient Instructions (Signed)
We'll get imaging of your lungs to see if there is something else (like a clot in your lungs) causing your worsened oxygen status than your underlying lung issues It doesn't look like enough fluid outside of your lungs to cause your symptoms.  For your bloating, start taking simethicone with meals and at night before bed.

## 2018-06-24 NOTE — Assessment & Plan Note (Signed)
Patient with continued bloating; she is still on PPI in setting of recent gastric ulcer (while in ICU - thought to be due to NGT).   Plan: --try simethicone with meals

## 2018-06-24 NOTE — Assessment & Plan Note (Signed)
Patient with new subcutaneous nodule of mix-axillary abd wall, found to be ~2x1cm, firm, mobile. POCUS reveals non-compressable structure with contents isoechoic to surrounding soft tissue. As no increased warmth, tenderness or swelling this is unlikely an abscess. Other consideration is a hematoma in setting of prophylactic anticoagulation during recent hospitalization.  Plan: --monitor for now; if not resolving can re-u/s and consider referral to gen surg

## 2018-06-24 NOTE — Progress Notes (Signed)
CC: abdominal bloating  HPI:  [name] is a 48 y.o. with a PMH of sarcoidosis, OSA, obesity hypoventilation, trach dependence with chronic hypoxic respiratory failure on chronic 2L [place], HTN, HLD, dCHF, NICM, chronic pulmonary hypertension, cirrhosis, T2DM, CKD, h/o cardiac arrest (further complication of acute LLE ischemic limb during resuscitation s/p artherectomy and fasciotomy as well as NGT associated gastric ulcer requiring blood transfusions) presenting to clinic for hospital follow up for AKI, anemia, and symptoms of bloating.  Patient hospitalized earlier this month for complaints of shortness of breath with productive cough and bloating thought to be volume up initially, however despite mild diuresis she had worsening of her CKD with Cr trending up to 4.46; labs following this indicated pre-renal etiology and she was gently hydrated with Cr trending down to 3.0 (previous b/l ~2.5). She also had acute on chronic anemia and required 1 unit of pRBCs in the hospital with hgb at d/c being 8.0; she was started on iron supplementation orally at discharge. She was weaned down from 4L [place] back to her home 2L; she was noted to have desaturations at night due to patient preference of not wanting to use oxygen per trach - when used by trach her saturations improved.   Bloating: Today patient states she continues to have bloating which is her most concerning symptom; this has now been ongoing for at least a couple of weeks. She endorses early satiety due to the bloating but does have an appetite. She feels the bloating is preventing her from breathing comfortably and she thinks this is why she is short of breath. She denies abdominal pain, vomiting, constipation, melena, hematochezia. She continues to take pantoprazole and denies current symptoms of acid reflux.  Shortness of breath, cough: She continues to have mild productive cough that has been unchanged for the last 3 or so weeks  except that this morning she noticed the sputum was brown tinged (previously white). She denies chest pain, fevers, chills, congestion, post-nasal drip. She states overall her LLE swelling and tightness is slowly continuing to improve; she denies RLE swelling or pain.  Subcutaneous nodule: Patient endorses noticing a right side nodule about 3 days ago. It is not painful, uncomfortable; she has not noticed increased redness, warmth and has not had fevers. She states this nodule is not close in proximity to where she injects insulin or to her heparin injections in the hospital. She has not had similar nodules previously.   Please see problem based Assessment and Plan for status of patients chronic conditions.  Past Medical History:  Diagnosis Date  . Abnormal LFTs (liver function tests)    Liver U/S and exam c/w HSM. Hep B serology neg. but Hep C ab +, HIV neg. 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 Acute kidney injury (AKI) with acute tubular necrosis (ATN) (HCC)   . Acute on chronic respiratory failure with hypoxemia ([place]) 05/05/2014  . Acute renal failure superimposed on stage 4 chronic kidney disease ([place])   . Acute respiratory failure ([place])   . Acute respiratory failure with hypoxia ([place])   . AKI (acute kidney injury) ([place]) 05/10/2011  . Anemia   . Aspiration pneumonia ([place]) 11/22/2013  . Cardiac arrest ([place]) 05/03/2018  . Cardiomyopathy, nonischemic ([place])    a. Varying EF over the years - initially 35% in 09/2010. Normal cors 12/2010. b. Echo 07/2014: EF 55-60%, no RWMA, + diastolic flattening and systolic flattening c/w  RV volume and pressure overload, mild LAE/RAE, mod dilated RV, mod TR, PASP severely increased at 87mmHg. c. RHC 07/2014: moderate pulmonary HTN likely WHO group 3 with markedly elevated CVP and relatively normal left sided pressures.  . Chronic diastolic CHF (congestive heart failure) ([place])   . Chronic respiratory failure ([place])     a. became O2 dependent in July 2011. b. She required trache placement in 07/2013 and has been followed by pulmonology as well.  . CKD (chronic kidney disease), stage II   . Complication of anesthesia    " difficult waking "  . Diabetes mellitus    insulin dependent  . Diabetic retinopathy    Right eye 2/11  . Difficult airway   . Dyspnea   . Essential hypertension   . Health maintenance examination    Mammogram 05/2010 Negative; Last Pap smear 03/2008; Last DM eye exam 2/11> mild non-proliferative diabetic retinopathy. OD  . Helicobacter pylori ab+ 05/2011   Pt was symptomatic and treatment planned for 05/2011  . Hx of cardiac cath 2/08   No CAD, no RAS,  normal EF  . Hypoxemia    CT angiogram 9/11>> No PE; PFTs 10/11- FEV1 1.20 (49%) with 16% better p B2, DLCO 33%> corrects to 84; O2 sats ok on 4 lpm X rapid walk X 3 laps 05/2010  . Long term current use of systemic steroids   . Morbid obesity ([place])    Target wt= 153 for BMI <30  . MRSA bacteremia 05/06/2018  . Obesity hypoventilation syndrome ([place])   . Pulmonary hypertension ([place])   . Pulmonary hypertension associated with sarcoidosis ([place]) 10/05/2010   Followed in Pulmonary clinic/ [facility]/ [facility]   - repeat echo 10/14/10 LV much worse than RV so rx as L Ht failure by cards/[place] - CTa 07/25/13 ? Very small pe vs artifact on L > venous dopplers 07/28/2013 neg bilaterally  - Echo 08/10/13 The RV was dialted and severely hyponetic and there was a D-shaped septum suggestive of PAH with RV pressure volume overload.    . QT prolongation   . Sarcoidosis    Followed by Dr. Melvyn Novas; w/ liver involvement per biopsy 12/09, Reversible airway component so started on [facility] 01/2010; HFA 75% p coaching 05/2010  . Seborrheic dermatitis of scalp   . Sleep apnea   . Tobacco abuse     Review of Systems:   Per HPI  Physical Exam:  Vitals:   06/24/18 1430 06/24/18 1458  BP: 138/69   Pulse: 97   Temp: 98.2 F (36.8 C)   TempSrc: Oral   SpO2:  (!) 75% 91%  Weight: 216 lb 1.6 oz (98 kg)   Height: 5\' 1"  (1.549 m)    GENERAL- alert, co-operative, appears as stated age, not in any distress. HEENT- trach in place CARDIAC- RRR, no murmurs, rubs or gallops. RESP- Moving equal volumes of air, bibasilar crackles ABDOMEN- Soft but distended, nontender, bowel sounds present. EXTREMITIES- pulse 2+ PT, symmetric. LLE with pitting edema and erythema that is stable from previous examinations. SKIN- Warm, dry. R ~midaxillary line abdominal, subcutaneous nodule ~2x1.5cm in size, firm, well defined boarders, not compressible, easily mobile PSYCH- Normal mood and affect, appropriate thought content and speech.  Assessment & Plan:   See Encounters Tab for problem based charting.   Patient discussed with Dr. Gerrit Friends, MD Internal Medicine PGY-3

## 2018-06-25 ENCOUNTER — Telehealth: Payer: Self-pay | Admitting: Acute Care

## 2018-06-25 ENCOUNTER — Telehealth: Payer: Self-pay | Admitting: *Deleted

## 2018-06-25 ENCOUNTER — Ambulatory Visit: Payer: Medicare Other

## 2018-06-25 ENCOUNTER — Telehealth: Payer: Self-pay | Admitting: Internal Medicine

## 2018-06-25 DIAGNOSIS — Z9981 Dependence on supplemental oxygen: Secondary | ICD-10-CM | POA: Diagnosis not present

## 2018-06-25 DIAGNOSIS — J9611 Chronic respiratory failure with hypoxia: Secondary | ICD-10-CM | POA: Diagnosis not present

## 2018-06-25 DIAGNOSIS — Z4789 Encounter for other orthopedic aftercare: Secondary | ICD-10-CM | POA: Diagnosis not present

## 2018-06-25 DIAGNOSIS — I272 Pulmonary hypertension, unspecified: Secondary | ICD-10-CM | POA: Diagnosis not present

## 2018-06-25 DIAGNOSIS — D860 Sarcoidosis of lung: Secondary | ICD-10-CM | POA: Diagnosis not present

## 2018-06-25 DIAGNOSIS — E11319 Type 2 diabetes mellitus with unspecified diabetic retinopathy without macular edema: Secondary | ICD-10-CM | POA: Diagnosis not present

## 2018-06-25 LAB — BMP8+ANION GAP
Anion Gap: 16 mmol/L (ref 10.0–18.0)
BUN/Creatinine Ratio: 8 — ABNORMAL LOW (ref 9–23)
BUN: 27 mg/dL — ABNORMAL HIGH (ref 6–24)
CO2: 26 mmol/L (ref 20–29)
Calcium: 8.2 mg/dL — ABNORMAL LOW (ref 8.7–10.2)
Chloride: 102 mmol/L (ref 96–106)
Creatinine, Ser: 3.23 mg/dL — ABNORMAL HIGH (ref 0.57–1.00)
GFR calc Af Amer: 19 mL/min/{1.73_m2} — ABNORMAL LOW (ref >59)
GFR calc non Af Amer: 16 mL/min/{1.73_m2} — ABNORMAL LOW (ref >59)
Glucose: 46 mg/dL — ABNORMAL LOW (ref 65–99)
Potassium: 3.9 mmol/L (ref 3.5–5.2)
Sodium: 144 mmol/L (ref 134–144)

## 2018-06-25 LAB — CBC
Hematocrit: 30.2 % — ABNORMAL LOW (ref 34.0–46.6)
Hemoglobin: 9.3 g/dL — ABNORMAL LOW (ref 11.1–15.9)
MCH: 28.2 pg (ref 26.6–33.0)
MCHC: 30.8 g/dL — ABNORMAL LOW (ref 31.5–35.7)
MCV: 92 fL (ref 79–97)
Platelets: 201 10*3/uL (ref 150–450)
RBC: 3.3 x10E6/uL — ABNORMAL LOW (ref 3.77–5.28)
RDW: 14.6 % (ref 11.7–15.4)
WBC: 9.3 10*3/uL (ref 3.4–10.8)

## 2018-06-25 NOTE — Telephone Encounter (Signed)
[facility] angela calls and states she is at pt's home now and BP is 98/64, HR 88. Pt states she feels her normal, denies increased short of breath, chest pain, weakness, dizziness, no change in speech, vision, strength. States she is taking her meds as directed and eating/ drinking well. She is cautioned that if any above start to call 911 asap, she is agreeable Please advise Sending to dr Trilby Drummer and attending of afternoon

## 2018-06-25 NOTE — Telephone Encounter (Signed)
Urgent Staff Message received by [name], NP:  [name], NP  P [facility] Triage Pool        Hey guys  Can you have this patients home health DME get her a concentrator that will deliver up to 10 liters please?   Need it ASAP  -please have the DME team call her and let her know when it will be ready  Thanks!   Laurey Arrow    Order has been placed for the concentrator with DME: [facility]. Will send reply to staff message that was sent by [facility] to have him sign off on the order that was placed.

## 2018-06-25 NOTE — Progress Notes (Signed)
Internal Medicine Clinic Attending  Case discussed with Dr. Svalina  at the time of the visit.  We reviewed the resident's history and exam and pertinent patient test results.  I agree with the assessment, diagnosis, and plan of care documented in the resident's note.  

## 2018-06-25 NOTE — Assessment & Plan Note (Signed)
On calling patient about serum glucose of 46 on Bmet yesterday she states she has had feeling of hypoglycemia last night that improved with eating a snack; she was not able to each much yesterday due to appointments. She confirms she has been taking levemir 46 units at night, victoza 1.8mg  daily and glipizide 2.5mg  daily. CBGs over the last couple of days have ranged from 99-194.   For now we will hold her glipizide and continue victoza and levemir. We discussed importance of eating regular meals and to continue checking her CBGs throughout the day. She agrees that she will start checking CBGs if she feels hypoglycemic symptoms and stated understanding of management of hypoglycemia. She agrees to call the clinic if she continues to have hypoglycemic episodes for further adjustment of her diabetes medication.

## 2018-06-25 NOTE — Telephone Encounter (Signed)
I reviewed last note. This blood pressure seems ok as long as she is asymptomatic. Would continue with plan that was outlined by Dr. Jari Favre yesterday.

## 2018-06-25 NOTE — Addendum Note (Signed)
Addended by: Alphonzo Grieve on: 06/25/2018 11:15 AM   Modules accepted: Orders

## 2018-06-25 NOTE — Telephone Encounter (Signed)
[facility] VO for 1xweek for 1 week, 2x week for 1 week, 1x week for 3 weeks for continued disease management and medication education and management. Do you agree?

## 2018-06-25 NOTE — Telephone Encounter (Signed)
Per Glenard Haring need verbal orders [phone]; per Glenard Haring it has been 4 day please call back, they are unable to go back to the patient home until they received orders

## 2018-06-25 NOTE — Addendum Note (Signed)
Addended by: Alphonzo Grieve on: 06/25/2018 01:41 PM   Modules accepted: Orders

## 2018-06-26 ENCOUNTER — Telehealth: Payer: Self-pay

## 2018-06-26 ENCOUNTER — Ambulatory Visit (HOSPITAL_COMMUNITY)
Admission: RE | Admit: 2018-06-26 | Discharge: 2018-06-26 | Disposition: A | Discharge status: Home / Self Care | Payer: Medicare Other | Source: Ambulatory Visit | Attending: Internal Medicine | Admitting: Internal Medicine

## 2018-06-26 DIAGNOSIS — R0602 Shortness of breath: Secondary | ICD-10-CM

## 2018-06-26 DIAGNOSIS — R42 Dizziness and giddiness: Secondary | ICD-10-CM | POA: Diagnosis not present

## 2018-06-26 DIAGNOSIS — E11319 Type 2 diabetes mellitus with unspecified diabetic retinopathy without macular edema: Secondary | ICD-10-CM | POA: Diagnosis not present

## 2018-06-26 DIAGNOSIS — J9611 Chronic respiratory failure with hypoxia: Secondary | ICD-10-CM | POA: Diagnosis not present

## 2018-06-26 DIAGNOSIS — I132 Hypertensive heart and chronic kidney disease with heart failure and with stage 5 chronic kidney disease, or end stage renal disease: Secondary | ICD-10-CM | POA: Diagnosis not present

## 2018-06-26 DIAGNOSIS — I5032 Chronic diastolic (congestive) heart failure: Secondary | ICD-10-CM | POA: Diagnosis not present

## 2018-06-26 DIAGNOSIS — J9621 Acute and chronic respiratory failure with hypoxia: Secondary | ICD-10-CM | POA: Diagnosis not present

## 2018-06-26 DIAGNOSIS — I959 Hypotension, unspecified: Secondary | ICD-10-CM | POA: Diagnosis not present

## 2018-06-26 DIAGNOSIS — I428 Other cardiomyopathies: Secondary | ICD-10-CM | POA: Diagnosis not present

## 2018-06-26 DIAGNOSIS — H748X3 Other specified disorders of middle ear and mastoid, bilateral: Secondary | ICD-10-CM | POA: Diagnosis not present

## 2018-06-26 DIAGNOSIS — I272 Pulmonary hypertension, unspecified: Secondary | ICD-10-CM | POA: Diagnosis not present

## 2018-06-26 DIAGNOSIS — Z4789 Encounter for other orthopedic aftercare: Secondary | ICD-10-CM | POA: Diagnosis not present

## 2018-06-26 DIAGNOSIS — N170 Acute kidney failure with tubular necrosis: Secondary | ICD-10-CM | POA: Diagnosis not present

## 2018-06-26 DIAGNOSIS — D860 Sarcoidosis of lung: Secondary | ICD-10-CM | POA: Diagnosis not present

## 2018-06-26 DIAGNOSIS — Z9981 Dependence on supplemental oxygen: Secondary | ICD-10-CM | POA: Diagnosis not present

## 2018-06-26 DIAGNOSIS — J90 Pleural effusion, not elsewhere classified: Secondary | ICD-10-CM | POA: Diagnosis not present

## 2018-06-26 DIAGNOSIS — N179 Acute kidney failure, unspecified: Secondary | ICD-10-CM | POA: Diagnosis not present

## 2018-06-26 DIAGNOSIS — J9811 Atelectasis: Secondary | ICD-10-CM | POA: Diagnosis not present

## 2018-06-26 MED ORDER — TECHNETIUM TO 99M ALBUMIN AGGREGATED
4.0000 | Freq: Once | INTRAVENOUS | Status: AC | PRN
  Administered 2018-06-26: 4 via INTRAVENOUS

## 2018-06-26 MED ORDER — TECHNETIUM TC 99M DIETHYLENETRIAME-PENTAACETIC ACID
31.0000 | Freq: Once | INTRAVENOUS | Status: AC | PRN
  Administered 2018-06-26: 31 via RESPIRATORY_TRACT

## 2018-06-26 NOTE — Telephone Encounter (Signed)
Levada Dy with [facility] want to notified the office, pt vital signs are normal. Pt blood pressure for today is 128/64.

## 2018-06-27 ENCOUNTER — Emergency Department (HOSPITAL_COMMUNITY): Payer: Medicare Other

## 2018-06-27 ENCOUNTER — Inpatient Hospital Stay (HOSPITAL_COMMUNITY)
Admission: EM | Admit: 2018-06-27 | Discharge: 2018-07-31 | DRG: 682 | Disposition: E | Payer: Medicare Other | Attending: Oncology | Admitting: Oncology

## 2018-06-27 ENCOUNTER — Encounter: Payer: Self-pay | Admitting: Internal Medicine

## 2018-06-27 ENCOUNTER — Ambulatory Visit: Payer: Medicare Other

## 2018-06-27 ENCOUNTER — Other Ambulatory Visit: Payer: Self-pay

## 2018-06-27 DIAGNOSIS — Z930 Tracheostomy status: Secondary | ICD-10-CM | POA: Diagnosis not present

## 2018-06-27 DIAGNOSIS — Z9981 Dependence on supplemental oxygen: Secondary | ICD-10-CM | POA: Diagnosis not present

## 2018-06-27 DIAGNOSIS — N185 Chronic kidney disease, stage 5: Secondary | ICD-10-CM | POA: Diagnosis not present

## 2018-06-27 DIAGNOSIS — R42 Dizziness and giddiness: Secondary | ICD-10-CM | POA: Diagnosis not present

## 2018-06-27 DIAGNOSIS — D631 Anemia in chronic kidney disease: Secondary | ICD-10-CM | POA: Diagnosis not present

## 2018-06-27 DIAGNOSIS — J90 Pleural effusion, not elsewhere classified: Secondary | ICD-10-CM | POA: Diagnosis not present

## 2018-06-27 DIAGNOSIS — I428 Other cardiomyopathies: Secondary | ICD-10-CM | POA: Diagnosis present

## 2018-06-27 DIAGNOSIS — Z8674 Personal history of sudden cardiac arrest: Secondary | ICD-10-CM | POA: Diagnosis not present

## 2018-06-27 DIAGNOSIS — E119 Type 2 diabetes mellitus without complications: Secondary | ICD-10-CM

## 2018-06-27 DIAGNOSIS — J9621 Acute and chronic respiratory failure with hypoxia: Secondary | ICD-10-CM

## 2018-06-27 DIAGNOSIS — Z6839 Body mass index (BMI) 39.0-39.9, adult: Secondary | ICD-10-CM

## 2018-06-27 DIAGNOSIS — R5381 Other malaise: Secondary | ICD-10-CM | POA: Diagnosis not present

## 2018-06-27 DIAGNOSIS — D649 Anemia, unspecified: Secondary | ICD-10-CM | POA: Diagnosis present

## 2018-06-27 DIAGNOSIS — Z66 Do not resuscitate: Secondary | ICD-10-CM | POA: Diagnosis not present

## 2018-06-27 DIAGNOSIS — J9811 Atelectasis: Secondary | ICD-10-CM | POA: Diagnosis not present

## 2018-06-27 DIAGNOSIS — I5033 Acute on chronic diastolic (congestive) heart failure: Secondary | ICD-10-CM | POA: Diagnosis not present

## 2018-06-27 DIAGNOSIS — Z825 Family history of asthma and other chronic lower respiratory diseases: Secondary | ICD-10-CM

## 2018-06-27 DIAGNOSIS — I890 Lymphedema, not elsewhere classified: Secondary | ICD-10-CM | POA: Diagnosis present

## 2018-06-27 DIAGNOSIS — H903 Sensorineural hearing loss, bilateral: Secondary | ICD-10-CM | POA: Diagnosis not present

## 2018-06-27 DIAGNOSIS — I878 Other specified disorders of veins: Secondary | ICD-10-CM | POA: Diagnosis present

## 2018-06-27 DIAGNOSIS — R270 Ataxia, unspecified: Secondary | ICD-10-CM | POA: Diagnosis present

## 2018-06-27 DIAGNOSIS — E113299 Type 2 diabetes mellitus with mild nonproliferative diabetic retinopathy without macular edema, unspecified eye: Secondary | ICD-10-CM | POA: Diagnosis present

## 2018-06-27 DIAGNOSIS — D860 Sarcoidosis of lung: Secondary | ICD-10-CM | POA: Diagnosis present

## 2018-06-27 DIAGNOSIS — I5032 Chronic diastolic (congestive) heart failure: Secondary | ICD-10-CM | POA: Diagnosis present

## 2018-06-27 DIAGNOSIS — E662 Morbid (severe) obesity with alveolar hypoventilation: Secondary | ICD-10-CM | POA: Diagnosis present

## 2018-06-27 DIAGNOSIS — I132 Hypertensive heart and chronic kidney disease with heart failure and with stage 5 chronic kidney disease, or end stage renal disease: Secondary | ICD-10-CM | POA: Diagnosis not present

## 2018-06-27 DIAGNOSIS — Z79899 Other long term (current) drug therapy: Secondary | ICD-10-CM

## 2018-06-27 DIAGNOSIS — E1122 Type 2 diabetes mellitus with diabetic chronic kidney disease: Secondary | ICD-10-CM | POA: Diagnosis present

## 2018-06-27 DIAGNOSIS — Z8614 Personal history of Methicillin resistant Staphylococcus aureus infection: Secondary | ICD-10-CM

## 2018-06-27 DIAGNOSIS — I959 Hypotension, unspecified: Secondary | ICD-10-CM | POA: Diagnosis not present

## 2018-06-27 DIAGNOSIS — Z515 Encounter for palliative care: Secondary | ICD-10-CM

## 2018-06-27 DIAGNOSIS — J9611 Chronic respiratory failure with hypoxia: Secondary | ICD-10-CM | POA: Diagnosis not present

## 2018-06-27 DIAGNOSIS — H9193 Unspecified hearing loss, bilateral: Secondary | ICD-10-CM | POA: Diagnosis present

## 2018-06-27 DIAGNOSIS — H9319 Tinnitus, unspecified ear: Secondary | ICD-10-CM | POA: Diagnosis present

## 2018-06-27 DIAGNOSIS — E875 Hyperkalemia: Secondary | ICD-10-CM | POA: Diagnosis present

## 2018-06-27 DIAGNOSIS — I2729 Other secondary pulmonary hypertension: Secondary | ICD-10-CM | POA: Diagnosis present

## 2018-06-27 DIAGNOSIS — Z9889 Other specified postprocedural states: Secondary | ICD-10-CM | POA: Diagnosis not present

## 2018-06-27 DIAGNOSIS — Z6841 Body Mass Index (BMI) 40.0 and over, adult: Secondary | ICD-10-CM | POA: Diagnosis not present

## 2018-06-27 DIAGNOSIS — N170 Acute kidney failure with tubular necrosis: Secondary | ICD-10-CM | POA: Diagnosis present

## 2018-06-27 DIAGNOSIS — N183 Chronic kidney disease, stage 3 (moderate): Secondary | ICD-10-CM | POA: Diagnosis not present

## 2018-06-27 DIAGNOSIS — I130 Hypertensive heart and chronic kidney disease with heart failure and stage 1 through stage 4 chronic kidney disease, or unspecified chronic kidney disease: Secondary | ICD-10-CM | POA: Diagnosis not present

## 2018-06-27 DIAGNOSIS — Z800 Family history of malignant neoplasm of digestive organs: Secondary | ICD-10-CM

## 2018-06-27 DIAGNOSIS — R0902 Hypoxemia: Secondary | ICD-10-CM | POA: Diagnosis not present

## 2018-06-27 DIAGNOSIS — Z820 Family history of epilepsy and other diseases of the nervous system: Secondary | ICD-10-CM

## 2018-06-27 DIAGNOSIS — K7460 Unspecified cirrhosis of liver: Secondary | ICD-10-CM | POA: Diagnosis present

## 2018-06-27 DIAGNOSIS — D539 Nutritional anemia, unspecified: Secondary | ICD-10-CM | POA: Diagnosis not present

## 2018-06-27 DIAGNOSIS — H81399 Other peripheral vertigo, unspecified ear: Secondary | ICD-10-CM

## 2018-06-27 DIAGNOSIS — R34 Anuria and oliguria: Secondary | ICD-10-CM | POA: Diagnosis present

## 2018-06-27 DIAGNOSIS — E11649 Type 2 diabetes mellitus with hypoglycemia without coma: Secondary | ICD-10-CM | POA: Diagnosis not present

## 2018-06-27 DIAGNOSIS — I10 Essential (primary) hypertension: Secondary | ICD-10-CM | POA: Diagnosis not present

## 2018-06-27 DIAGNOSIS — N179 Acute kidney failure, unspecified: Secondary | ICD-10-CM | POA: Diagnosis not present

## 2018-06-27 DIAGNOSIS — D869 Sarcoidosis, unspecified: Secondary | ICD-10-CM | POA: Diagnosis not present

## 2018-06-27 DIAGNOSIS — Z7189 Other specified counseling: Secondary | ICD-10-CM

## 2018-06-27 DIAGNOSIS — N184 Chronic kidney disease, stage 4 (severe): Secondary | ICD-10-CM | POA: Diagnosis not present

## 2018-06-27 DIAGNOSIS — Z833 Family history of diabetes mellitus: Secondary | ICD-10-CM

## 2018-06-27 DIAGNOSIS — H748X3 Other specified disorders of middle ear and mastoid, bilateral: Secondary | ICD-10-CM | POA: Diagnosis not present

## 2018-06-27 DIAGNOSIS — Z7952 Long term (current) use of systemic steroids: Secondary | ICD-10-CM

## 2018-06-27 DIAGNOSIS — Z794 Long term (current) use of insulin: Secondary | ICD-10-CM

## 2018-06-27 DIAGNOSIS — Z87891 Personal history of nicotine dependence: Secondary | ICD-10-CM

## 2018-06-27 DIAGNOSIS — Z8249 Family history of ischemic heart disease and other diseases of the circulatory system: Secondary | ICD-10-CM

## 2018-06-27 DIAGNOSIS — G4730 Sleep apnea, unspecified: Secondary | ICD-10-CM | POA: Diagnosis present

## 2018-06-27 LAB — DIFFERENTIAL
Abs Immature Granulocytes: 0.07 10*3/uL (ref 0.00–0.07)
Basophils Absolute: 0 10*3/uL (ref 0.0–0.1)
Basophils Relative: 0 %
Eosinophils Absolute: 0.1 10*3/uL (ref 0.0–0.5)
Eosinophils Relative: 1 %
Immature Granulocytes: 1 %
Lymphocytes Relative: 17 %
Lymphs Abs: 1.7 10*3/uL (ref 0.7–4.0)
Monocytes Absolute: 0.6 10*3/uL (ref 0.1–1.0)
Monocytes Relative: 6 %
Neutro Abs: 7.4 10*3/uL (ref 1.7–7.7)
Neutrophils Relative %: 75 %

## 2018-06-27 LAB — CBC
HCT: 32 % — ABNORMAL LOW (ref 36.0–46.0)
Hemoglobin: 9 g/dL — ABNORMAL LOW (ref 12.0–15.0)
MCH: 28.2 pg (ref 26.0–34.0)
MCHC: 28.1 g/dL — ABNORMAL LOW (ref 30.0–36.0)
MCV: 100.3 fL — ABNORMAL HIGH (ref 80.0–100.0)
Platelets: 236 10*3/uL (ref 150–400)
RBC: 3.19 MIL/uL — ABNORMAL LOW (ref 3.87–5.11)
RDW: 16.9 % — ABNORMAL HIGH (ref 11.5–15.5)
WBC: 10 10*3/uL (ref 4.0–10.5)
nRBC: 0.7 % — ABNORMAL HIGH (ref 0.0–0.2)

## 2018-06-27 LAB — PROTIME-INR
INR: 1 (ref 0.8–1.2)
Prothrombin Time: 13.5 seconds (ref 11.4–15.2)

## 2018-06-27 LAB — COMPREHENSIVE METABOLIC PANEL
ALT: 38 U/L (ref 0–44)
AST: 28 U/L (ref 15–41)
Albumin: 2.8 g/dL — ABNORMAL LOW (ref 3.5–5.0)
Alkaline Phosphatase: 174 U/L — ABNORMAL HIGH (ref 38–126)
Anion gap: 12 (ref 5–15)
BUN: 52 mg/dL — ABNORMAL HIGH (ref 6–20)
CO2: 25 mmol/L (ref 22–32)
Calcium: 7.6 mg/dL — ABNORMAL LOW (ref 8.9–10.3)
Chloride: 103 mmol/L (ref 98–111)
Creatinine, Ser: 7.05 mg/dL — ABNORMAL HIGH (ref 0.44–1.00)
GFR calc Af Amer: 7 mL/min — ABNORMAL LOW (ref >60)
GFR calc non Af Amer: 6 mL/min — ABNORMAL LOW (ref >60)
Glucose, Bld: 85 mg/dL (ref 70–99)
Potassium: 4.3 mmol/L (ref 3.5–5.1)
Sodium: 140 mmol/L (ref 135–145)
Total Bilirubin: 0.5 mg/dL (ref 0.3–1.2)
Total Protein: 7.2 g/dL (ref 6.5–8.1)

## 2018-06-27 LAB — I-STAT CREATININE, ED: Creatinine, Ser: 7 mg/dL — ABNORMAL HIGH (ref 0.44–1.00)

## 2018-06-27 LAB — I-STAT BETA HCG BLOOD, ED (MC, WL, AP ONLY): I-stat hCG, quantitative: <5 m[IU]/mL (ref <5)

## 2018-06-27 LAB — GLUCOSE, CAPILLARY: Glucose-Capillary: 97 mg/dL (ref 70–99)

## 2018-06-27 LAB — CBG MONITORING, ED: Glucose-Capillary: 75 mg/dL (ref 70–99)

## 2018-06-27 LAB — APTT: aPTT: 28 seconds (ref 24–36)

## 2018-06-27 LAB — ETHANOL: Alcohol, Ethyl (B): <10 mg/dL (ref <10)

## 2018-06-27 MED ORDER — SIMETHICONE 80 MG PO CHEW
80.0000 mg | CHEWABLE_TABLET | Freq: Four times a day (QID) | ORAL | Status: DC | PRN
  Filled 2018-06-27: qty 1

## 2018-06-27 MED ORDER — PANTOPRAZOLE SODIUM 40 MG PO TBEC
40.0000 mg | DELAYED_RELEASE_TABLET | Freq: Two times a day (BID) | ORAL | Status: DC
  Administered 2018-06-27 – 2018-06-30 (×6): 40 mg via ORAL
  Filled 2018-06-27 (×6): qty 1

## 2018-06-27 MED ORDER — HEPARIN SODIUM (PORCINE) 5000 UNIT/ML IJ SOLN
5000.0000 [IU] | Freq: Three times a day (TID) | INTRAMUSCULAR | Status: DC
  Administered 2018-06-27 – 2018-06-30 (×9): 5000 [IU] via SUBCUTANEOUS
  Filled 2018-06-27 (×9): qty 1

## 2018-06-27 MED ORDER — INSULIN ASPART 100 UNIT/ML ~~LOC~~ SOLN
0.0000 [IU] | Freq: Three times a day (TID) | SUBCUTANEOUS | Status: DC
  Administered 2018-06-28: 1 [IU] via SUBCUTANEOUS
  Administered 2018-06-29: 3 [IU] via SUBCUTANEOUS
  Administered 2018-06-30: 1 [IU] via SUBCUTANEOUS

## 2018-06-27 MED ORDER — ACETAMINOPHEN 325 MG PO TABS: 650.0000 mg | ORAL_TABLET | Freq: Four times a day (QID) | ORAL | Status: DC | PRN

## 2018-06-27 MED ORDER — ATORVASTATIN CALCIUM 40 MG PO TABS
40.0000 mg | ORAL_TABLET | Freq: Every day | ORAL | Status: DC
  Administered 2018-06-27 – 2018-06-29 (×3): 40 mg via ORAL
  Filled 2018-06-27 (×3): qty 1

## 2018-06-27 MED ORDER — INSULIN ASPART 100 UNIT/ML ~~LOC~~ SOLN
4.0000 [IU] | Freq: Three times a day (TID) | SUBCUTANEOUS | Status: DC
  Administered 2018-06-28 – 2018-06-29 (×2): 4 [IU] via SUBCUTANEOUS

## 2018-06-27 MED ORDER — MELATONIN 3 MG PO TABS
3.0000 mg | ORAL_TABLET | Freq: Every evening | ORAL | Status: DC | PRN
  Administered 2018-06-27: 3 mg via ORAL
  Filled 2018-06-27 (×2): qty 1

## 2018-06-27 MED ORDER — SODIUM CHLORIDE 0.9 % IV SOLN: INTRAVENOUS | Status: AC

## 2018-06-27 MED ORDER — MECLIZINE HCL 25 MG PO TABS
50.0000 mg | ORAL_TABLET | Freq: Once | ORAL | Status: AC
  Administered 2018-06-27: 50 mg via ORAL
  Filled 2018-06-27: qty 2

## 2018-06-27 MED ORDER — ACETAMINOPHEN 650 MG RE SUPP: 650.0000 mg | Freq: Four times a day (QID) | RECTAL | Status: DC | PRN

## 2018-06-27 MED ORDER — INSULIN ASPART 100 UNIT/ML ~~LOC~~ SOLN
0.0000 [IU] | Freq: Every day | SUBCUTANEOUS | Status: DC
  Administered 2018-06-28: 1 [IU] via SUBCUTANEOUS

## 2018-06-27 MED ORDER — INSULIN DETEMIR 100 UNIT/ML ~~LOC~~ SOLN
18.0000 [IU] | Freq: Every day | SUBCUTANEOUS | Status: DC
  Administered 2018-06-28: 18 [IU] via SUBCUTANEOUS
  Filled 2018-06-27 (×3): qty 0.18

## 2018-06-27 MED ORDER — MELATONIN 3 MG PO TABS: 3.0000 mg | ORAL_TABLET | Freq: Every evening | ORAL | Status: DC | PRN

## 2018-06-27 NOTE — H&P (Addendum)
Date: 06/17/2018               Patient Name:  Melody Gray MRN: [id number]  DOB: [birth year] Age / Sex: 48 y.o., female   PCP: Neva Seat, MD         Medical Service: Internal Medicine Teaching Service         Attending Physician: Dr. Rebeca Alert Raynaldo Opitz, MD    First Contact: Dr. Truman Hayward Pager: [phone]  Second Contact: Dr. Trilby Drummer Pager: [phone]       After Hours (After 5p/  First Contact Pager: [phone]  weekends / holidays): Second Contact Pager: [phone]   Chief Complaint: Dizziness  History of Present Illness: 48 year old female with past medical history of pulmonary hypertension, morbid obesity, chronic hypoxic respiratory failure (tracheostomy), HTN, CKD4, sarcoidosis, resented to ED due to 4-day history of dizziness, bilateral decreased hearing. She discharged from hospital 1 week ago (Was admitted due to SOB, diuresed with IV furosemide. Developed some AKI on CKD that gradually improved).  She reports that she feels room spinning and dizzy, also in the morning and when she walks.  It is not positional.  She did not have any fall with it, but sometimes it is hard for her to walk due to severe dizziness.  She also noticed difficulty hearing on both ears.  No pain, no headache, fever or chills.  No nausea vomiting, numbness, tingling or weakness. She does 2 L nasal oxygen at home and endorses that she required to increase it to 4 L this week.  Having said that, she did not have major shortness of breath at home.  She has some baseline productive cough at mornings, and reports to times of dark brown sputum with it that resolved.  She denies any chest pain, abdominal pain, diarrhea.  She called EMS today due to worsening of dizziness. Per ED provider, she reported to be hypoxic on EMS arrival, which improved with 6 L nasal O2, also found to be ataxic.  In ED: She found to have AKI on CKD with creatinine 7.05 (Last time was 3.02 at last discharge) CXR with some worsening?  of pleural effusion/atelctasis. MRI showed bilateral mastoid effusion.   She was admitted for further evaluation and management.   Meds: No outpatient medications have been marked as taking for the 06/21/2018 encounter [facility] Encounter).     Allergies: Allergies as of 06/10/2018 - Review Complete 06/24/2018  Allergen Reaction Noted  . Vicodin [hydrocodone-acetaminophen] Itching 11/17/2010   Past Medical History:  Diagnosis Date  . Abnormal LFTs (liver function tests)    Liver U/S and exam c/w HSM. Hep B serology neg. but Hep C ab +, HIV neg. 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 Acute kidney injury (AKI) with acute tubular necrosis (ATN) (HCC)   . Acute on chronic respiratory failure with hypoxemia ([place]) 05/05/2014  . Acute renal failure superimposed on stage 4 chronic kidney disease ([place])   . Acute respiratory failure ([place])   . Acute respiratory failure with hypoxia ([place])   . AKI (acute kidney injury) ([place]) 05/10/2011  . Anemia   . Aspiration pneumonia ([place]) 11/22/2013  . Cardiac arrest ([place]) 05/03/2018  . Cardiomyopathy, nonischemic ([place])    a. Varying EF over the years - initially 35% in 09/2010. Normal cors 12/2010. b. Echo 07/2014: EF 55-60%, no RWMA, + diastolic flattening and systolic flattening c/w RV volume and pressure overload, mild LAE/RAE, mod dilated  RV, mod TR, PASP severely increased at 75mmHg. c. RHC 07/2014: moderate pulmonary HTN likely WHO group 3 with markedly elevated CVP and relatively normal left sided pressures.  . Chronic diastolic CHF (congestive heart failure) ([place])   . Chronic respiratory failure ([place])    a. became O2 dependent in July 2011. b. She required trache placement in 07/2013 and has been followed by pulmonology as well.  . CKD (chronic kidney disease), stage II   . Complication of anesthesia    " difficult waking "  . Diabetes mellitus    insulin dependent  . Diabetic retinopathy    Right eye 2/11    . Difficult airway   . Dyspnea   . Essential hypertension   . Health maintenance examination    Mammogram 05/2010 Negative; Last Pap smear 03/2008; Last DM eye exam 2/11> mild non-proliferative diabetic retinopathy. OD  . Helicobacter pylori ab+ 05/2011   Pt was symptomatic and treatment planned for 05/2011  . Hx of cardiac cath 2/08   No CAD, no RAS,  normal EF  . Hypoxemia    CT angiogram 9/11>> No PE; PFTs 10/11- FEV1 1.20 (49%) with 16% better p B2, DLCO 33%> corrects to 84; O2 sats ok on 4 lpm X rapid walk X 3 laps 05/2010  . Long term current use of systemic steroids   . Morbid obesity ([place])    Target wt= 153 for BMI <30  . MRSA bacteremia 05/06/2018  . Obesity hypoventilation syndrome ([place])   . Pulmonary hypertension ([place])   . Pulmonary hypertension associated with sarcoidosis ([place]) 10/05/2010   Followed in Pulmonary clinic/ [facility]/ [place]   - repeat echo 10/14/10 LV much worse than RV so rx as L Ht failure by cards/[place] - CTa 07/25/13 ? Very small pe vs artifact on L > venous dopplers 07/28/2013 neg bilaterally  - Echo 08/10/13 The RV was dialted and severely hyponetic and there was a D-shaped septum suggestive of PAH with RV pressure volume overload.    . QT prolongation   . Sarcoidosis    Followed by Dr. Melvyn Novas; w/ liver involvement per biopsy 12/09, Reversible airway component so started on [facility] 01/2010; HFA 75% p coaching 05/2010  . Seborrheic dermatitis of scalp   . Sleep apnea   . Tobacco abuse     Family History:  Father             Multiple sclerosis Mother            colon cancer Father             [place]atties   Social History:  Former smoker (8 cigarettes/day) No alcohol use Denies drug use  Review of Systems: A complete ROS was negative except as per HPI.  Physical Exam: Blood pressure (!) 92/56, pulse 80, temperature (!) 97.5 F (36.4 C), temperature source Oral, resp. rate (!) 22, SpO2 93 %. General: Morbidly obese, pleasant lady, lying in the bed with  no acute distress Head and neck: Has trach Otoscopic exam: External canals are normal without redness, discharge or tenderness bilaterally. TMs are normal bilaterally. CV: Tachycardic, no murmur Pulm: Saturating 95% on 5 L nasal O2, is in no acute distress, has some decreased breath sounds at bases with mild crackles bilaterally Abdomen is soft, nontender to palpation, BS are present Extremities: Pulses are normal bilaterally, scar of surgery visible without evidence of infection or nonhealing. trace pitting edema at the left lower leg. Neurologic exam: Bilateral nystagmus on upper gaze and  lateral gaze. Cranial nerves I to XII are normal.  Sensation and motor are normal bilateraly  EKG: personally reviewed my interpretation is SNR  CXR: personally reviewed my interpretation is bibasilar haziness, with probable effusion. (Present at previous chest x-ray with some worsening) Assessment & Plan by Problem: Active Problems:   AKI (acute kidney injury) ([place])   48 year old female with past medical history of pulmonary hypertension, morbid obesity, chronic hypoxic respiratory failure (tracheostomy), HTN, CKD, sarcoidosis, resented to ED due to 4-day history of dizziness, bilateral decreased hearing.    AKI on CKD Hypotension Patient report decreased urination despite drinking lots of fluid.  Significant worsening of creatinine today comparing to previous admission (7.05 from 3.23).  Patient has AKI, hypotension and pleural effusion at the same time. Despite, worsening of haziness/pleural effusion on chest x-ray, she does not look significantly hypervolemic on exam and favor IV fluid (will give at slow rate.)  -NS 75 ml/h -Will monitor her O2 sat and pulm symptoms meanwhile. -CMP -U/A -UDS -CBC tomorrow AM -CMP tomorrow AM  Dizziness Patient is hypotensive and her dizziness can be due to intravascular volume depletion. Dizziness and decreased hearing (improved today) and nystagmus on  exam can be associated with effusions in mastoid sinuses. Got 50 mg meclizine at ED and had some improvement. Etoh level: Nl   -Recommend to reevaluate after IV hydration.    HFpEF No significant volume overload on exam  -Lipitor 40 mg QD -We are holding home (antiHTN) meds:Carvedilol, Amlodipine due to hypotension   DMII: On Victoza 1.8 mg Subcu daily and Glipizide 2.5 mg QD, Detemir 46 u at bedtime at home  -Sensitive SSI -NovoLog 4 units 3 times daily -Levemir 18 units at bedtime -BG monitoring  Increased O2 requirement:  Chronic hypoxic respiratory failure PHTN  No focal infiltrate on CXR. Could be worsening of atelectasis. Currently stable and saturating well at 5 li.  -Continuous pulse oximetry -Nasal O2 to keep O2 saturation more than 94%    Diet: HH carb od IV fluid: NS VTE ppx:Hep Code status: Full  Dispo: Admit patient to Inpatient with expected length of stay greater than 2 midnights.  SignedDewayne Hatch, MD 06/23/2018, 8:50 PM  Pager: [phone]

## 2018-06-27 NOTE — ED Triage Notes (Signed)
Pt arrives via [facility] from home with dizziness since Monday. Pt reports feels off balance and spinning sensation. PT able to ambulate with high steps. 120/80, 82, 18, 95% on 4L via trach. cbg 92. Pt alert, oriented x4.

## 2018-06-27 NOTE — ED Notes (Signed)
Pharmacy messaged about unverified meds 

## 2018-06-27 NOTE — Telephone Encounter (Signed)
Thank you :)

## 2018-06-27 NOTE — ED Notes (Signed)
RN confirmed dinner tray is ordered

## 2018-06-27 NOTE — ED Notes (Signed)
Dinner tray delivered.

## 2018-06-27 NOTE — ED Provider Notes (Signed)
[facility] EMERGENCY DEPARTMENT Provider Note   CSN: [id number] Arrival date & time: 06/26/2018  1233    History   Chief Complaint Chief Complaint  Patient presents with  . Dizziness    HPI Melody Gray is a 48 y.o. female who presents to the ED with c/o vertigo. She has a pmh of diabetes, hypertension, normal nonischemic cardiomyopathy, pulmonary hypertension, morbid obesity, chronic hypoxic respiratory failure, chronic kidney failure, and sarcoid.  Patient states that she has been dizzy for the past 4 days.  She states that she has room spinning sensation however has been able to ambulate.  Today her symptoms became severe.  She complains that the room is spinning.  She is having associated hearing loss or tinnitus, she noticed she is having difficulty hearing her television.  She states that today when she was trying to walk she was so off balance she cannot ambulate.  Her daughter told her that she looked like she was "high-stepping."  She denies any history of the same.  She denies any ear pain.Denies headaches, light headedness, weakness, visual disturbances, UL weakness, sensation changes. Review of EMR shows that she has been dealing with hypoxia and had her 02 liters increased to 4L She has also been having hypotension. The patient denies Orthostatic symptoms.        HPI  Past Medical History:  Diagnosis Date  . Abnormal LFTs (liver function tests)    Liver U/S and exam c/w HSM. Hep B serology neg. but Hep C ab +, HIV neg. 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 Acute kidney injury (AKI) with acute tubular necrosis (ATN) (HCC)   . Acute on chronic respiratory failure with hypoxemia ([place]) 05/05/2014  . Acute renal failure superimposed on stage 4 chronic kidney disease ([place])   . Acute respiratory failure ([place])   . Acute respiratory failure with hypoxia ([place])   . AKI (acute kidney injury) ([place])  05/10/2011  . Anemia   . Aspiration pneumonia ([place]) 11/22/2013  . Cardiac arrest ([place]) 05/03/2018  . Cardiomyopathy, nonischemic ([place])    a. Varying EF over the years - initially 35% in 09/2010. Normal cors 12/2010. b. Echo 07/2014: EF 55-60%, no RWMA, + diastolic flattening and systolic flattening c/w RV volume and pressure overload, mild LAE/RAE, mod dilated RV, mod TR, PASP severely increased at 20mmHg. c. RHC 07/2014: moderate pulmonary HTN likely WHO group 3 with markedly elevated CVP and relatively normal left sided pressures.  . Chronic diastolic CHF (congestive heart failure) ([place])   . Chronic respiratory failure ([place])    a. became O2 dependent in July 2011. b. She required trache placement in 07/2013 and has been followed by pulmonology as well.  . CKD (chronic kidney disease), stage II   . Complication of anesthesia    " difficult waking "  . Diabetes mellitus    insulin dependent  . Diabetic retinopathy    Right eye 2/11  . Difficult airway   . Dyspnea   . Essential hypertension   . Health maintenance examination    Mammogram 05/2010 Negative; Last Pap smear 03/2008; Last DM eye exam 2/11> mild non-proliferative diabetic retinopathy. OD  . Helicobacter pylori ab+ 05/2011   Pt was symptomatic and treatment planned for 05/2011  . Hx of cardiac cath 2/08   No CAD, no RAS,  normal EF  . Hypoxemia    CT angiogram 9/11>> No PE; PFTs 10/11- FEV1  1.20 (49%) with 16% better p B2, DLCO 33%> corrects to 84; O2 sats ok on 4 lpm X rapid walk X 3 laps 05/2010  . Long term current use of systemic steroids   . Morbid obesity ([place])    Target wt= 153 for BMI <30  . MRSA bacteremia 05/06/2018  . Obesity hypoventilation syndrome ([place])   . Pulmonary hypertension ([place])   . Pulmonary hypertension associated with sarcoidosis ([place]) 10/05/2010   Followed in Pulmonary clinic/ [facility]/ [place]   - repeat echo 10/14/10 LV much worse than RV so rx as L Ht failure by cards/[place] - CTa 07/25/13 ? Very small pe  vs artifact on L > venous dopplers 07/28/2013 neg bilaterally  - Echo 08/10/13 The RV was dialted and severely hyponetic and there was a D-shaped septum suggestive of PAH with RV pressure volume overload.    . QT prolongation   . Sarcoidosis    Followed by Dr. Melvyn Novas; w/ liver involvement per biopsy 12/09, Reversible airway component so started on [facility] 01/2010; HFA 75% p coaching 05/2010  . Seborrheic dermatitis of scalp   . Sleep apnea   . Tobacco abuse     Patient Active Problem List   Diagnosis Date Noted  . 'light-for-dates' infant with signs of fetal malnutrition 06/23/2018  . Bloating 06/24/2018  . Subcutaneous nodule of abdominal wall 06/24/2018  . Shortness of breath 06/12/2018  . Left renal mass- 3.1cm  05/16/2018  . Esophageal candidiasis ([place]) 05/13/2018  . Gastric ulcer with hemorrhage   . Tobacco use disorder 05/12/2018  . History of upper gastrointestinal hemorrhage   . Acute blood loss anemia   . CKD (chronic kidney disease) stage 4, GFR 15-29 ml/min (HCC)   . Morbid obesity ([place])   . Pneumonia 05/06/2018  . Traumatic compartment syndrome of left lower extremity ([place])   . Chronic pulmonary hypertension ([place]) 04/30/2018  . Gallbladder polyp 05/07/2017  . Cirrhosis ([place]) 05/07/2017  . Chronic respiratory failure with hypoxia (HCC)/ trach dep 04/06/2017  . Osteoporosis screening 12/09/2015  . Morbid obesity due to excess calories ([place]) complicated by OHS/ hbp 20/25/4270  . Tracheostomy dependent ([place])   . Cigarette smoker   . Obesity hypoventilation syndrome ([place])   . Cardiomyopathy, nonischemic ([place])   . Long term current use of systemic steroids 05/05/2014  . Health care maintenance 02/22/2014  . Sleep apnea 12/17/2013  . Adjustment disorder with depressed mood 11/13/2013  . Tracheostomy status ([place]) 10/28/2013  . Hyperlipidemia with target LDL less than 100 09/15/2013  . Smoker 07/25/2013  . ASCUS (atypical squamous cells of undetermined significance) on Pap  smear 09/03/2012  . Arterial epistaxis 11/13/2011  . AKI (acute kidney injury) ([place]) 05/10/2011  . Left hip pain 08/02/2010  . Anemia associated with chronic renal failure 01/19/2010  . Mild nonproliferative diabetic retinopathy associated with type 2 diabetes mellitus ([place]) 06/16/2009  . Chronic diastolic heart failure, grade 2 04/28/2009  . NEPHROTIC SYNDROME 04/12/2009  . Body mass index (BMI) of 40.1-44.9 in adult ([place]) 04/06/2009  . GERD (gastroesophageal reflux disease) 06/12/2008  . Sarcoidosis ([place]) 05/22/2008  . 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 HTN (hypertension), benign 2020-04-1107  . History of prolonged Q-T interval on ECG 2020-04-1107    Past Surgical History:  Procedure Laterality Date  . BIOPSY  05/13/2018   Procedure: BIOPSY;  Surgeon: [name], MD;  Location: [facility];  Service: Gastroenterology;;  . BREAST SURGERY    .  CESAREAN SECTION    . ESOPHAGOGASTRODUODENOSCOPY (EGD) WITH PROPOFOL N/A 05/13/2018   Procedure: ESOPHAGOGASTRODUODENOSCOPY (EGD) WITH PROPOFOL;  Surgeon: [name], MD;  Location: [facility];  Service: Gastroenterology;  Laterality: N/A;  . FASCIOTOMY Left 05/03/2018   Procedure: FOUR COMPARTMENT FASCIOTOMY, LEFT LOWER LEG;  Surgeon: [name], MD;  Location: [facility];  Service: Vascular;  Laterality: Left;  . FASCIOTOMY CLOSURE Left 05/06/2018   Procedure: FASCIOTOMY CLOSURE OF LEFT LEG;  Surgeon: Marty Heck, MD;  Location: [facility];  Service: Vascular;  Laterality: Left;  . RIGHT HEART CATHETERIZATION N/A 08/12/2014   Procedure: RIGHT HEART CATH;  Surgeon: Jolaine Artist, MD;  Location: [facility] CATH LAB;  Service: Cardiovascular;  Laterality: N/A;  . TRACHEOSTOMY TUBE PLACEMENT N/A 08/10/2013   Procedure: TRACHEOSTOMY;  Surgeon: Melissa Montane, MD;  Location: [facility];  Service: ENT;  Laterality: N/A;  . TUBAL LIGATION       OB History    No obstetric history on file.      Home Medications    Prior to Admission medications   Medication Sig Start Date End Date Taking? Authorizing Provider  amLODipine (NORVASC) 10 MG tablet Take 1 tablet (10 mg total) by mouth daily. 01/22/18 01/22/19  Alphonzo Grieve, MD  atorvastatin (LIPITOR) 40 MG tablet Take 1 tablet (40 mg total) by mouth daily. 01/17/18   Neva Seat, MD  BD INSULIN SYRINGE U/F 30G X 1/2" 0.5 ML MISC USE AS DIRECTED 04/16/18   Neva Seat, MD  carvedilol (COREG) 6.25 MG tablet Take 1 tablet (6.25 mg total) by mouth 2 (two) times daily. 07/23/17   Lorella Nimrod, MD  ferrous sulfate 325 (65 FE) MG tablet Take 1 tablet (325 mg total) by mouth daily with breakfast. 06/20/18   [name], MD  glucose blood test strip Use to check blood sugar 3 times daily. Dx code E11.65. Insulin dependent 05/25/18   Neva Seat, MD  insulin detemir (LEVEMIR) 100 UNIT/ML injection Inject 0.46 mLs (46 Units total) into the skin at bedtime. ADMINISTER 50 UNITS UNDER THE SKIN AT BEDTIME 06/20/18   [name], MD  Insulin Pen Needle (B-D UF III MINI PEN NEEDLES) 31G X 5 MM MISC USE TWICE A DAY 11/22/17   Neva Seat, MD  Melatonin 3 MG TABS Take 3 mg by mouth at bedtime as needed (sleep).    [provider]  OXYGEN Inhale 2 L into the lungs continuous.    [provider]  pantoprazole (PROTONIX) 40 MG tablet Take 1 tablet (40 mg total) by mouth 2 (two) times daily. 05/18/18 07/17/18  Ina Homes, MD  simethicone (GAS-X) 80 MG chewable tablet Chew 1 tablet (80 mg total) by mouth 4 (four) times daily as needed for flatulence. 06/24/18 06/24/19  Alphonzo Grieve, MD  VICTOZA 18 MG/3ML SOPN INJECT 1.8 MILLIGRAM SUBCUTANEOUSLY DAILY Patient taking differently: Inject 1.8 mg into the skin daily.  05/21/18   Neva Seat, MD    Family History Family History  Problem Relation Age of Onset  . Cancer Mother        colon cancer  . Multiple sclerosis Father     . Diabetes Father   . Asthma Sister        in childhood  . Hypertension Other     Social History Social History   Tobacco Use  . Smoking status: Former Smoker    Packs/day: 0.33    Years: 20.00    Pack years: 6.60    Types:  Cigarettes    Start date: 05/17/2018  . Smokeless tobacco: Never Used  . Tobacco comment: quit Jan 2020  Substance Use Topics  . Alcohol use: No    Alcohol/week: 0.0 standard drinks  . Drug use: No     Allergies   Vicodin [hydrocodone-acetaminophen]   Review of Systems Review of Systems Ten systems reviewed and are negative for acute change, except as noted in the HPI.    Physical Exam Updated Vital Signs BP (!) 88/65 (BP Location: Right Arm)   Pulse 81   Temp 98.2 F (36.8 C)   Resp 14   SpO2 93%   Physical Exam Vitals signs and nursing note reviewed.  Constitutional:      General: She is not in acute distress.    Appearance: She is well-developed. She is not diaphoretic.  HENT:     Head: Normocephalic and atraumatic.     Right Ear: Tympanic membrane and ear canal normal.     Left Ear: Tympanic membrane and ear canal normal.  Eyes:     General: No scleral icterus.    Conjunctiva/sclera: Conjunctivae normal.     Comments: + nystagmus UL with swift beats to the left.  Neck:     Musculoskeletal: Normal range of motion.  Cardiovascular:     Rate and Rhythm: Normal rate and regular rhythm.     Heart sounds: Normal heart sounds. No murmur. No friction rub. No gallop.   Pulmonary:     Effort: Pulmonary effort is normal. No respiratory distress.     Breath sounds: Normal breath sounds.  Abdominal:     General: Bowel sounds are normal. There is no distension.     Palpations: Abdomen is soft. There is no mass.     Tenderness: There is no abdominal tenderness. There is no guarding.  Skin:    General: Skin is warm and dry.  Neurological:     Mental Status: She is alert and oriented to person, place, and time.     Comments: Speech is  clear and goal oriented, follows commands Major Cranial nerves without deficit (except nystagmus as noted in eye exam), no facial droop Normal strength in upper and lower extremities bilaterally including dorsiflexion and plantar flexion, strong and equal grip strength Sensation normal to light and sharp touch Moves extremities without ataxia, coordination intact Normal finger to nose and rapid alternating movements no pronator drift Normal heel-shin and balance  Gait deferred.   Psychiatric:        Behavior: Behavior normal.      ED Treatments / Results  Labs (all labs ordered are listed, but only abnormal results are displayed) Labs Reviewed  CBC - Abnormal; Notable for the following components:      Result Value   RBC 3.19 (*)    Hemoglobin 9.0 (*)    HCT 32.0 (*)    MCV 100.3 (*)    MCHC 28.1 (*)    RDW 16.9 (*)    nRBC 0.7 (*)    All other components within normal limits  COMPREHENSIVE METABOLIC PANEL - Abnormal; Notable for the following components:   BUN 52 (*)    Creatinine, Ser 7.05 (*)    Calcium 7.6 (*)    Albumin 2.8 (*)    Alkaline Phosphatase 174 (*)    GFR calc non Af Amer 6 (*)    GFR calc Af Amer 7 (*)    All other components within normal limits  I-STAT CREATININE, ED - Abnormal; Notable for  the following components:   Creatinine, Ser 7.00 (*)    All other components within normal limits  ETHANOL  PROTIME-INR  APTT  DIFFERENTIAL  URINALYSIS, ROUTINE W REFLEX MICROSCOPIC  RAPID URINE DRUG SCREEN, HOSP PERFORMED  COMPREHENSIVE METABOLIC PANEL  CBC  I-STAT BETA HCG BLOOD, ED ([facility], [facility], [facility] ONLY)  CBG MONITORING, ED    EKG None  Radiology Dg Chest 2 View  Result Date: 06/28/2018 CLINICAL DATA:  Cough.  Shortness of breath. EXAM: CHEST - 2 VIEW COMPARISON:  Chest x-ray 06/26/2018. FINDINGS: Tracheostomy tube noted with its tip over the trachea stable position. Cardiomegaly again noted. Progressive bibasilar atelectasis. Bibasilar infiltrates/edema  can not be excluded. Small bilateral pleural effusions. No pneumothorax. No acute bony abnormality. IMPRESSION: 1.  Tracheostomy tube in stable position. 2. Progressive bibasilar atelectasis. Bibasilar infiltrates/edema can not be excluded. Small bilateral pleural effusions. Electronically Signed   By: Marcello Moores  Register   On: 06/13/2018 14:24   Dg Chest 2 View  Result Date: 06/26/2018 CLINICAL DATA:  Shortness of breath for 3 weeks, chest tightness, tingling in LEFT hand, hypertension, diabetes mellitus EXAM: CHEST - 2 VIEW COMPARISON:  06/12/2018 FINDINGS: Tracheostomy tube noted. Enlargement of cardiac silhouette with pulmonary vascular congestion. Mediastinal contours normal. Bibasilar atelectasis. Perihilar infiltrates greater on RIGHT, question asymmetric pulmonary edema less likely infection. No pleural effusions or pneumothorax. Bones unremarkable. IMPRESSION: Bibasilar atelectasis. Large min of cardiac silhouette with pulmonary vascular congestion and asymmetric pulmonary infiltrates question asymmetric edema versus less likely pneumonia. Electronically Signed   By: Lavonia Dana M.D.   On: 06/26/2018 13:28   Mr Brain Wo Contrast  Result Date: 06/13/2018 CLINICAL DATA:  Dizziness and sensorineural hearing loss. EXAM: MRI HEAD WITHOUT CONTRAST TECHNIQUE: Multiplanar, multiecho pulse sequences of the brain and surrounding structures were obtained without intravenous contrast. COMPARISON:  None. FINDINGS: Examination is degraded by motion. BRAIN: There is no acute infarct, acute hemorrhage, hydrocephalus or extra-axial collection. The midline structures are normal. No midline shift or other mass effect. Multifocal white matter hyperintensity, most commonly due to chronic ischemic microangiopathy. The cerebral and cerebellar volume are age-appropriate. Susceptibility-sensitive sequences show no chronic microhemorrhage or superficial siderosis. VASCULAR: Major intracranial arterial and venous sinus flow  voids are normal. SKULL AND UPPER CERVICAL SPINE: Calvarial bone marrow signal is normal. There is no skull base mass. Visualized upper cervical spine and soft tissues are normal. SINUSES/ORBITS: Right-greater-than-left mastoid effusions. The orbits are normal. IMPRESSION: 1. Motion degraded examination without acute intracranial abnormality. 2. Mild findings of chronic small vessel disease. 3. Right-greater-than-left mastoid effusions, which could contribute to dizziness. Electronically Signed   By: Ulyses Jarred M.D.   On: 06/09/2018 15:43   Nm Pulmonary Perf And Vent  Result Date: 06/26/2018 CLINICAL DATA:  Shortness of breath. EXAM: NUCLEAR MEDICINE VENTILATION - PERFUSION LUNG SCAN VIEWS: Anterior, posterior, left lateral, right lateral, RPO, LPO, RAO, LAO-ventilation and perfusion RADIOPHARMACEUTICALS:  31.0 mCi of Tc-64m DTPA aerosol inhalation and 4.15 mCi Tc50m MAA IV COMPARISON:  Chest radiograph June 26, 2018 FINDINGS: Ventilation: There is mild photopenia in both lung bases in a nonsegmental distribution. There is no segmental distribution ventilation defect. Perfusion: Radiotracer uptake is essentially homogeneous and symmetric bilaterally. There is no appreciable perfusion defect. Heart is noted to be enlarged. IMPRESSION: No appreciable perfusion defects. There are areas of patchy photopenia in the lung bases consistent with areas of patchy opacity on the chest radiograph. There is cardiomegaly. This study constitutes an overall very low probability of pulmonary embolus. Electronically Signed  By: Lowella Grip III M.D.   On: 06/26/2018 14:11    Procedures .Critical Care Performed by: Margarita Mail, PA-C Authorized by: Margarita Mail, PA-C   Critical care provider statement:    Critical care time (minutes):  45   Critical care time was exclusive of:  Separately billable procedures and treating other patients   Critical care was necessary to treat or prevent imminent or  life-threatening deterioration of the following conditions:  Renal failure and respiratory failure   Critical care was time spent personally by me on the following activities:  Discussions with consultants, evaluation of patient's response to treatment, examination of patient, ordering and performing treatments and interventions, ordering and review of laboratory studies, ordering and review of radiographic studies, pulse oximetry, re-evaluation of patient's condition, obtaining history from patient or surrogate and review of old charts   (including critical care time)  Medications Ordered in ED Medications  heparin injection 5,000 Units (has no administration in time range)  acetaminophen (TYLENOL) tablet 650 mg (has no administration in time range)    Or  acetaminophen (TYLENOL) suppository 650 mg (has no administration in time range)  pantoprazole (PROTONIX) EC tablet 40 mg (has no administration in time range)  simethicone (MYLICON) chewable tablet 80 mg (has no administration in time range)  atorvastatin (LIPITOR) tablet 40 mg (has no administration in time range)  insulin detemir (LEVEMIR) injection 18 Units (has no administration in time range)  insulin aspart (novoLOG) injection 0-9 Units (has no administration in time range)  insulin aspart (novoLOG) injection 0-5 Units (has no administration in time range)  insulin aspart (novoLOG) injection 4 Units (has no administration in time range)  0.9 %  sodium chloride infusion (has no administration in time range)  Melatonin TABS 3 mg (has no administration in time range)  meclizine (ANTIVERT) tablet 50 mg (50 mg Oral Given 06/16/2018 1333)     Initial Impression / Assessment and Plan / ED Course  I have reviewed the triage vital signs and the nursing notes.  Pertinent labs & imaging results that were available during my care of the patient were reviewed by me and considered in my medical decision making (see chart for details).         48 year old female with multiple comorbidities who presents with dizziness, ataxia and nystagmus.  MRI shows no acute infarcts.  She does have mastoid effusions bilaterally.  Believe this is a peripheral vertigo.  He was also found to be in acute hypoxic respiratory failure with a kidney injury.  She had a recent admission for the same and will be admitted to the internal medicine teaching service.  Review of her PA lateral chest films are is worsening pulmonary edema and bilateral pleural effusions worse on the left.  I personally reviewed the films and agree with the radiologic interpretation.  Patient has persistent low blood pressures however do not feel that this reflects sepsis and according to EMR she has had some recent medication changes.  She does not have positive orthostatic vital signs.  On 6 L via nasal cannula with stable course here in the emergency department. Final Clinical Impressions(s) / ED Diagnoses   Final diagnoses:  Hypotension, unspecified hypotension type  Acute on chronic respiratory failure with hypoxia (HCC)  AKI (acute kidney injury) [facility])  Peripheral vertigo, unspecified laterality    ED Discharge Orders    None       Ned Grace 06/26/2018 2138    Jola Schmidt, MD 06/28/18 [phone]

## 2018-06-27 NOTE — ED Notes (Signed)
Attempted report 

## 2018-06-28 ENCOUNTER — Other Ambulatory Visit: Payer: Self-pay

## 2018-06-28 ENCOUNTER — Inpatient Hospital Stay (HOSPITAL_COMMUNITY): Payer: Medicare Other

## 2018-06-28 ENCOUNTER — Encounter (HOSPITAL_COMMUNITY): Payer: Self-pay

## 2018-06-28 DIAGNOSIS — E662 Morbid (severe) obesity with alveolar hypoventilation: Secondary | ICD-10-CM

## 2018-06-28 DIAGNOSIS — Z885 Allergy status to narcotic agent status: Secondary | ICD-10-CM

## 2018-06-28 DIAGNOSIS — Z794 Long term (current) use of insulin: Secondary | ICD-10-CM

## 2018-06-28 DIAGNOSIS — I5032 Chronic diastolic (congestive) heart failure: Secondary | ICD-10-CM

## 2018-06-28 DIAGNOSIS — R42 Dizziness and giddiness: Secondary | ICD-10-CM

## 2018-06-28 DIAGNOSIS — N184 Chronic kidney disease, stage 4 (severe): Secondary | ICD-10-CM

## 2018-06-28 DIAGNOSIS — I959 Hypotension, unspecified: Secondary | ICD-10-CM

## 2018-06-28 DIAGNOSIS — Z79899 Other long term (current) drug therapy: Secondary | ICD-10-CM

## 2018-06-28 DIAGNOSIS — I2729 Other secondary pulmonary hypertension: Secondary | ICD-10-CM

## 2018-06-28 DIAGNOSIS — Z930 Tracheostomy status: Secondary | ICD-10-CM

## 2018-06-28 DIAGNOSIS — Z9981 Dependence on supplemental oxygen: Secondary | ICD-10-CM

## 2018-06-28 DIAGNOSIS — H748X3 Other specified disorders of middle ear and mastoid, bilateral: Secondary | ICD-10-CM

## 2018-06-28 DIAGNOSIS — N179 Acute kidney failure, unspecified: Secondary | ICD-10-CM

## 2018-06-28 DIAGNOSIS — Z87891 Personal history of nicotine dependence: Secondary | ICD-10-CM

## 2018-06-28 DIAGNOSIS — I130 Hypertensive heart and chronic kidney disease with heart failure and stage 1 through stage 4 chronic kidney disease, or unspecified chronic kidney disease: Secondary | ICD-10-CM

## 2018-06-28 DIAGNOSIS — J9611 Chronic respiratory failure with hypoxia: Secondary | ICD-10-CM

## 2018-06-28 DIAGNOSIS — E1122 Type 2 diabetes mellitus with diabetic chronic kidney disease: Secondary | ICD-10-CM

## 2018-06-28 DIAGNOSIS — D869 Sarcoidosis, unspecified: Secondary | ICD-10-CM

## 2018-06-28 DIAGNOSIS — H903 Sensorineural hearing loss, bilateral: Secondary | ICD-10-CM

## 2018-06-28 LAB — CK: Total CK: 65 U/L (ref 38–234)

## 2018-06-28 LAB — COMPREHENSIVE METABOLIC PANEL
ALT: 33 U/L (ref 0–44)
AST: 23 U/L (ref 15–41)
Albumin: 2.7 g/dL — ABNORMAL LOW (ref 3.5–5.0)
Alkaline Phosphatase: 164 U/L — ABNORMAL HIGH (ref 38–126)
Anion gap: 9 (ref 5–15)
BUN: 56 mg/dL — ABNORMAL HIGH (ref 6–20)
CO2: 28 mmol/L (ref 22–32)
Calcium: 7.4 mg/dL — ABNORMAL LOW (ref 8.9–10.3)
Chloride: 101 mmol/L (ref 98–111)
Creatinine, Ser: 7.77 mg/dL — ABNORMAL HIGH (ref 0.44–1.00)
GFR calc Af Amer: 6 mL/min — ABNORMAL LOW (ref >60)
GFR calc non Af Amer: 6 mL/min — ABNORMAL LOW (ref >60)
Glucose, Bld: 148 mg/dL — ABNORMAL HIGH (ref 70–99)
Potassium: 5.1 mmol/L (ref 3.5–5.1)
Sodium: 138 mmol/L (ref 135–145)
Total Bilirubin: 0.9 mg/dL (ref 0.3–1.2)
Total Protein: 6.6 g/dL (ref 6.5–8.1)

## 2018-06-28 LAB — CBC
HCT: 29.9 % — ABNORMAL LOW (ref 36.0–46.0)
Hemoglobin: 8.2 g/dL — ABNORMAL LOW (ref 12.0–15.0)
MCH: 27.8 pg (ref 26.0–34.0)
MCHC: 27.4 g/dL — ABNORMAL LOW (ref 30.0–36.0)
MCV: 101.4 fL — ABNORMAL HIGH (ref 80.0–100.0)
Platelets: 260 10*3/uL (ref 150–400)
RBC: 2.95 MIL/uL — ABNORMAL LOW (ref 3.87–5.11)
RDW: 16.9 % — ABNORMAL HIGH (ref 11.5–15.5)
WBC: 11.5 10*3/uL — ABNORMAL HIGH (ref 4.0–10.5)
nRBC: 0.9 % — ABNORMAL HIGH (ref 0.0–0.2)

## 2018-06-28 LAB — GLUCOSE, CAPILLARY
Glucose-Capillary: 105 mg/dL — ABNORMAL HIGH (ref 70–99)
Glucose-Capillary: 135 mg/dL — ABNORMAL HIGH (ref 70–99)
Glucose-Capillary: 129 mg/dL — ABNORMAL HIGH (ref 70–99)

## 2018-06-28 LAB — FERRITIN: Ferritin: 56 ng/mL (ref 11–307)

## 2018-06-28 LAB — IRON AND TIBC
Iron: 40 ug/dL (ref 28–170)
Saturation Ratios: 11 % (ref 10.4–31.8)
TIBC: 365 ug/dL (ref 250–450)
UIBC: 325 ug/dL

## 2018-06-28 LAB — URIC ACID: Uric Acid, Serum: 11.7 mg/dL — ABNORMAL HIGH (ref 2.5–7.1)

## 2018-06-28 LAB — MRSA PCR SCREENING: MRSA by PCR: NEGATIVE

## 2018-06-28 LAB — VITAMIN B12: Vitamin B-12: 410 pg/mL (ref 180–914)

## 2018-06-28 MED ORDER — FUROSEMIDE 10 MG/ML IJ SOLN
120.0000 mg | Freq: Two times a day (BID) | INTRAVENOUS | Status: DC
  Administered 2018-06-28: 120 mg via INTRAVENOUS
  Filled 2018-06-28: qty 12
  Filled 2018-06-28: qty 2

## 2018-06-28 NOTE — Progress Notes (Signed)
[facility]  Patient Status: Active (receiving services up to time of hospitalization)  [facility] is providing the following services: RN, PT and OT  If patient discharges after hours, please call [phone].   Melody Gray 06/28/2018, 9:35 AM

## 2018-06-28 NOTE — Progress Notes (Signed)
Patient placed on ATC for the night and Melody Gray Valve removed and attached still to trach collar. Patient saturation levels better at this time.

## 2018-06-28 NOTE — Progress Notes (Signed)
Patient was bladder scanned 92 ml was found in scan

## 2018-06-28 NOTE — Progress Notes (Signed)
  Date: 06/28/2018  Patient name: Melody Gray  Medical record number: [id number]  Date of birth: [birth year]   I have seen and evaluated this patient and I have discussed the plan of care with the house staff. Please see their note for complete details. I concur with their findings with the following additions/corrections:   Please see my separate attestation of the H and P from 06/15/2018.  I personally spent >35 minutes with the patient gather history, performing my exam, and discussing the plan of care.  Lenice Pressman, M.D., Ph.D. 06/28/2018, 10:22 PM

## 2018-06-28 NOTE — Care Management Note (Signed)
Case Management Note  Patient Details  Name: Melody Gray MRN: [id number] Date of Birth: November 04, 1970  Subjective/Objective:    From home with family, AKI, active with [facility] for RN , PT, OT.                  Action/Plan: NCM will follow for transition of care needs.   Expected Discharge Date:  11-Jul-2018               Expected Discharge Plan:  [place]  In-House Referral:     Discharge planning Services  CM Consult  Post Acute Care Choice:  Resumption of Svcs/PTA Provider Choice offered to:  Patient  DME Arranged:    DME Agency:     HH Arranged:  RN, PT, OT [place] Agency:  [facility]  Status of Service:  In process, will continue to follow  If discussed at Long Length of Stay Meetings, dates discussed:    Additional Comments:  Zenon Mayo, RN 06/28/2018, 10:26 AM

## 2018-06-28 NOTE — Clinical Social Work Note (Signed)
CSW acknowledges consult for "advanced directives" however consult will need to be placed to chaplin. Clinical Social Worker will sign off for now as social work intervention is no longer needed. Please consult [name] again if new need arises.   Melody Gray 06/28/2018

## 2018-06-28 NOTE — Progress Notes (Signed)
Subjective:  Melody Gray is a 48 y.o. with PMH of HFpEF, trach-dependent OSH, HTN, CKD4, T2DM, and chronic respiratory failure on 2L O2 [place] admit for AKI on hospital day 1  Melody Gray was examined and evaluated at bedside this AM. She was observed resting comfortably in bed eating breakfast. She was able to provide additional history regarding her admission stating she had acute onset bilateral hearing loss 4 days ago with associated dizziness. She denies any inciting event and states she is continuing to endorse this hearing loss with difficulty hearing the hospital TV. She also mentions that her urinary output has significantly decreased the last couple days. Denies any significant dyspnea, chest pain, palpitations, or increasing lower extremity swelling.  Objective:  Vital signs in last 24 hours: Vitals:   06/28/18 0343 06/28/18 0819 06/28/18 1133 06/28/18 1137  BP: (!) 84/46 (!) 92/59 (!) 95/55   Pulse: 75 71 74 77  Resp: 16 20 19 18   Temp: 98.3 F (36.8 C) 97.8 F (36.6 C) 98.2 F (36.8 C)   TempSrc: Oral Oral Oral   SpO2: 92% 90% 92% 94%  Weight:      Height:       Physical Exam  Constitutional: She is oriented to person, place, and time and well-developed, well-nourished, and in no distress. No distress.  HENT:  Mouth/Throat: Oropharynx is clear and moist.  Eyes: Conjunctivae are normal. No scleral icterus.  Neck: Normal range of motion. Neck supple. No JVD present.  Cardiovascular: Normal rate, regular rhythm, normal heart sounds and intact distal pulses.  No murmur heard. Pulmonary/Chest: Effort normal. She has rales (bibasilar).  Abdominal: Soft. Bowel sounds are normal. There is no abdominal tenderness. There is no guarding.  Musculoskeletal: Normal range of motion.        General: Edema (left leg  2+ pitting edema up to upper shin) present.  Neurological: She is alert and oriented to person, place, and time.  Sensorineural bilateral hearing loss  Skin:  Skin is warm and dry. She is not diaphoretic.    Assessment/Plan:  Active Problems:   AKI (acute kidney injury) ([place])  Melody Gray is a 48 yo F w/ PMH of HFpEF, trach-dependent OSH, HTN, CKD4, T2DM, and chronic respiratory failure on 2L O2 [name] admit for AKI. Unclear etiology of her sudden onset AKI as she was seen 4 days ago in clinic with creatinine of 3.23 but on admission it is 7.00. She received some fluid in ED and aki worsened to 7.77. Also difficult to ascertain cause of her hearing loss. MRI was negative for stroke. Will consult nephro.  AKI on CKD Received 500cc bolus in ED. Creatine trend 7.0->7.77. Had baseline renal function 4 days prior in clinic. Poor urinary output. - Nephro consult - F/u Renal ultrasound - Trend renal panel  Dizziness and bilateral hearing loss Unclear etiology. MRI brain not showing stroke. Right greater than left mastoid effusions - C/w monitor - Could consider consulting ENT  Chronic Diastolic Heart Failure Does not appear grossly volume overloaded on exam. -Strict I&O - Daily weights - Holding anti-hypertensive in light of hypotension - C/w home med: lipitor 40mg  daily  Type 2 DM AM bg 105 - Glucose checks - SSI - Levemir 18 unit qhs - Novolog 4 units TID qc  Chronic hypoxic respiratory failure with trach dependence Satting 95% on O2 through trach - Keep O2 sat >88 - Encourage trach O2 use daily  DVT prophx: subqheparin Diet: Cardiac, Diabetic Bowel: Senokot Code: Full  Dispo:  Anticipated discharge in approximately 4-5 day(s).   [name], MD 06/28/2018, 1:13 PM Pager: [phone]

## 2018-06-28 NOTE — Consult Note (Signed)
Reason for Consult: AKI/CKD stage 4 Referring Physician: Rebeca Alert, MD  Ronald Lobo Boak is an 48 y.o. female.  HPI: Ms. Donnell has an extensive PMH significant for morbid obesity, pulmonary HTN, chronic hypoxic respiratory failure s/p tracheostomy, HTN, sarcoidosis (lung and liver involvement), diastolic CHF, cirrhosis by CT scan, and CKD stage 4 who was discharged on 06/20/18 following hospitalization for decompensated CHF complicated by AKI/CKD.  Her Cr peaked at 4.46 at that time and improved to 3.23 at discharge who presented to [facility] ED on 06/26/2018 with chief complaint of dizziness.  She also reported bilateral hearing loss.  An MRI was obtained which did not show an acute CVA but did show bilateral mastoid effusions.  While in the ED she was noted to be hypotensive and hypoxic.  Labs were obtained and were notable for BUN/Cr of 52/7.05.  Since admission, her Cr has continued to climb and was 7.77 today.  We were consulted to further evaluate and manage her AKI/CKD stage 4.  The trend in Scr is seen below.  Of note, she was discharged on ACE-Inhibitor but unclear if she is taking this at home.  She denies any N/V/D, dysuria, pyuria, hematuria, urgency, frequency, or retention.  She also denies any NSAIDs/Cox-II I's.    Trend in Creatinine: Creatinine, Ser  Date/Time Value Ref Range Status  06/28/2018 02:31 AM 7.77 (H) 0.44 - 1.00 mg/dL Final  06/28/2018 01:43 PM 7.00 (H) 0.44 - 1.00 mg/dL Final  06/21/2018 01:20 PM 7.05 (H) 0.44 - 1.00 mg/dL Final  06/24/2018 04:28 PM 3.23 (H) 0.57 - 1.00 mg/dL Final  06/20/2018 03:26 AM 3.02 (H) 0.44 - 1.00 mg/dL Final  06/19/2018 03:54 AM 3.31 (H) 0.44 - 1.00 mg/dL Final  06/18/2018 04:45 AM 3.68 (H) 0.44 - 1.00 mg/dL Final  06/17/2018 06:14 AM 3.85 (H) 0.44 - 1.00 mg/dL Final  06/16/2018 07:10 AM 4.46 (H) 0.44 - 1.00 mg/dL Final  06/15/2018 06:51 AM 4.36 (H) 0.44 - 1.00 mg/dL Final  06/14/2018 01:05 AM 4.27 (H) 0.44 - 1.00 mg/dL Final  06/13/2018 05:16  AM 3.82 (H) 0.44 - 1.00 mg/dL Final  06/12/2018 03:40 AM 2.88 (H) 0.44 - 1.00 mg/dL Final  05/30/2018 11:43 AM 2.38 (H) 0.57 - 1.00 mg/dL Final  05/22/2018 01:30 PM 2.56 (H) 0.44 - 1.00 mg/dL Final  05/18/2018 01:50 PM 3.93 (H) 0.44 - 1.00 mg/dL Final  05/18/2018 04:40 AM 4.05 (H) 0.44 - 1.00 mg/dL Final  05/17/2018 04:21 PM 4.23 (H) 0.44 - 1.00 mg/dL Final  05/17/2018 07:27 AM 4.43 (H) 0.44 - 1.00 mg/dL Final  05/16/2018 04:27 PM 4.06 (H) 0.44 - 1.00 mg/dL Final  05/16/2018 05:00 AM 4.46 (H) 0.44 - 1.00 mg/dL Final  05/15/2018 05:00 AM 4.51 (H) 0.44 - 1.00 mg/dL Final  05/14/2018 06:53 AM 4.08 (H) 0.44 - 1.00 mg/dL Final  05/13/2018 03:50 AM 4.45 (H) 0.44 - 1.00 mg/dL Final  05/12/2018 11:26 PM 4.33 (H) 0.44 - 1.00 mg/dL Final  05/12/2018 06:01 AM 4.68 (H) 0.44 - 1.00 mg/dL Final  05/11/2018 06:20 AM 3.43 (H) 0.44 - 1.00 mg/dL Final  05/10/2018 06:49 AM 3.34 (H) 0.44 - 1.00 mg/dL Final  05/08/2018 04:46 AM 4.02 (H) 0.44 - 1.00 mg/dL Final  05/07/2018 06:11 AM 4.17 (H) 0.44 - 1.00 mg/dL Final  05/06/2018 06:26 AM 4.64 (H) 0.44 - 1.00 mg/dL Final  05/05/2018 05:52 AM 5.01 (H) 0.44 - 1.00 mg/dL Final  05/04/2018 05:03 PM 4.90 (H) 0.44 - 1.00 mg/dL Final  05/04/2018 04:29 AM 4.88 (H) 0.44 -  1.00 mg/dL Final  05/03/2018 04:08 PM 4.67 (H) 0.44 - 1.00 mg/dL Final  05/03/2018 02:56 PM 3.60 (H) 0.44 - 1.00 mg/dL Final  05/03/2018 08:14 AM 4.52 (H) 0.44 - 1.00 mg/dL Final  05/02/2018 08:15 AM 3.22 (H) 0.44 - 1.00 mg/dL Final  05/01/2018 05:38 AM 3.22 (H) 0.44 - 1.00 mg/dL Final  04/30/2018 09:16 PM 3.40 (H) 0.44 - 1.00 mg/dL Final  04/30/2018 07:02 PM 3.23 (H) 0.44 - 1.00 mg/dL Final  02/04/2018 11:41 AM 2.69 (H) 0.57 - 1.00 mg/dL Final  01/22/2018 10:10 AM 2.33 (H) 0.57 - 1.00 mg/dL Final  01/17/2018 02:11 PM 2.39 (H) 0.57 - 1.00 mg/dL Final  06/07/2017 04:36 PM 1.72 (H) 0.57 - 1.00 mg/dL Final  12/07/2016 03:02 PM 1.40 (H) 0.57 - 1.00 mg/dL Final  11/09/2016 02:38 PM 1.41 (H) 0.57 - 1.00  mg/dL Final  08/19/2015 11:20 AM 1.31 (H) 0.57 - 1.00 mg/dL Final  08/16/2014 04:05 AM 1.31 (H) 0.50 - 1.10 mg/dL Final  08/15/2014 03:44 AM 1.43 (H) 0.50 - 1.10 mg/dL Final  08/14/2014 05:04 AM 1.63 (H) 0.50 - 1.10 mg/dL Final  08/13/2014 08:30 AM 2.02 (H) 0.50 - 1.10 mg/dL Final    PMH:   Past Medical History:  Diagnosis Date  . Abnormal LFTs (liver function tests)    Liver U/S and exam c/w HSM. Hep B serology neg. but Hep C ab +, HIV neg. 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 Acute kidney injury (AKI) with acute tubular necrosis (ATN) (H[facility])   . Acute on chronic respiratory failure with hypoxemia ([place]) 05/05/2014  . Acute renal failure superimposed on stage 4 chronic kidney disease ([place])   . Acute respiratory failure ([place])   . Acute respiratory failure with hypoxia ([place])   . AKI (acute kidney injury) ([place]) 05/10/2011  . Anemia   . Aspiration pneumonia ([place]) 11/22/2013  . Cardiac arrest ([place]) 05/03/2018  . Cardiomyopathy, nonischemic ([place])    a. Varying EF over the years - initially 35% in 09/2010. Normal cors 12/2010. b. Echo 07/2014: EF 55-60%, no RWMA, + diastolic flattening and systolic flattening c/w RV volume and pressure overload, mild LAE/RAE, mod dilated RV, mod TR, PASP severely increased at 56mmHg. c. RHC 07/2014: moderate pulmonary HTN likely WHO group 3 with markedly elevated CVP and relatively normal left sided pressures.  . Chronic diastolic CHF (congestive heart failure) ([place])   . Chronic respiratory failure ([place])    a. became O2 dependent in July 2011. b. She required trache placement in 07/2013 and has been followed by pulmonology as well.  . CKD (chronic kidney disease), stage II   . Complication of anesthesia    " difficult waking "  . Diabetes mellitus    insulin dependent  . Diabetic retinopathy    Right eye 2/11  . Difficult airway   . Dyspnea   . Essential hypertension   . Health maintenance examination     Mammogram 05/2010 Negative; Last Pap smear 03/2008; Last DM eye exam 2/11> mild non-proliferative diabetic retinopathy. OD  . Helicobacter pylori ab+ 05/2011   Pt was symptomatic and treatment planned for 05/2011  . Hx of cardiac cath 2/08   No CAD, no RAS,  normal EF  . Hypoxemia    CT angiogram 9/11>> No PE; PFTs 10/11- FEV1 1.20 (49%) with 16% better p B2, DLCO 33%> corrects to 84; O2 sats ok on 4 lpm X rapid walk X 3 laps 05/2010  .  Long term current use of systemic steroids   . Morbid obesity ([place])    Target wt= 153 for BMI <30  . MRSA bacteremia 05/06/2018  . Obesity hypoventilation syndrome ([place])   . Pulmonary hypertension ([place])   . Pulmonary hypertension associated with sarcoidosis ([place]) 10/05/2010   Followed in Pulmonary clinic/ [facility]/ [place]   - repeat echo 10/14/10 LV much worse than RV so rx as L Ht failure by cards/[place] - CTa 07/25/13 ? Very small pe vs artifact on L > venous dopplers 07/28/2013 neg bilaterally  - Echo 08/10/13 The RV was dialted and severely hyponetic and there was a D-shaped septum suggestive of PAH with RV pressure volume overload.    . QT prolongation   . Sarcoidosis    Followed by Dr. Melvyn Novas; w/ liver involvement per biopsy 12/09, Reversible airway component so started on [facility] 01/2010; HFA 75% p coaching 05/2010  . Seborrheic dermatitis of scalp   . Sleep apnea   . Tobacco abuse     PSH:   Past Surgical History:  Procedure Laterality Date  . BIOPSY  05/13/2018   Procedure: BIOPSY;  Surgeon: [name], MD;  Location: [place];  Service: Gastroenterology;;  . BREAST SURGERY    . CESAREAN SECTION    . ESOPHAGOGASTRODUODENOSCOPY (EGD) WITH PROPOFOL N/A 05/13/2018   Procedure: ESOPHAGOGASTRODUODENOSCOPY (EGD) WITH PROPOFOL;  Surgeon: [name], MD;  Location: [place];  Service: Gastroenterology;  Laterality: N/A;  . FASCIOTOMY Left 05/03/2018   Procedure: FOUR COMPARTMENT FASCIOTOMY, LEFT LOWER LEG;  Surgeon: [name], MD;  Location: [place];  Service: Vascular;  Laterality: Left;  . FASCIOTOMY CLOSURE Left 05/06/2018   Procedure: FASCIOTOMY CLOSURE OF LEFT LEG;  Surgeon: Marty Heck, MD;  Location: [place];  Service: Vascular;  Laterality: Left;  . RIGHT HEART CATHETERIZATION N/A 08/12/2014   Procedure: RIGHT HEART CATH;  Surgeon: Jolaine Artist, MD;  Location: [facility] CATH LAB;  Service: Cardiovascular;  Laterality: N/A;  . TRACHEOSTOMY TUBE PLACEMENT N/A 08/10/2013   Procedure: TRACHEOSTOMY;  Surgeon: Melissa Montane, MD;  Location: [place];  Service: ENT;  Laterality: N/A;  . TUBAL LIGATION      Allergies:  Allergies  Allergen Reactions  . Vicodin [Hydrocodone-Acetaminophen] Itching    Medications:   Prior to Admission medications   Medication Sig Start Date End Date Taking? Authorizing Provider  amLODipine (NORVASC) 10 MG tablet Take 1 tablet (10 mg total) by mouth daily. 01/22/18 01/22/19  Alphonzo Grieve, MD  atorvastatin (LIPITOR) 40 MG tablet Take 1 tablet (40 mg total) by mouth daily. 01/17/18   Neva Seat, MD  BD INSULIN SYRINGE U/F 30G X 1/2" 0.5 ML MISC USE AS DIRECTED 04/16/18   Neva Seat, MD  carvedilol (COREG) 6.25 MG tablet Take 1 tablet (6.25 mg total) by mouth 2 (two) times daily. 07/23/17   Lorella Nimrod, MD  ferrous sulfate 325 (65 FE) MG tablet Take 1 tablet (325 mg total) by mouth daily with breakfast. 06/20/18   [name], MD  glucose blood test strip Use to check blood sugar 3 times daily. Dx code E11.65. Insulin dependent 05/25/18   Neva Seat, MD  insulin detemir (LEVEMIR) 100 UNIT/ML injection Inject 0.46 mLs (46 Units total) into the skin at bedtime. ADMINISTER 50 UNITS UNDER THE SKIN AT BEDTIME Patient taking differently: Inject 46 Units into the skin at bedtime.  06/20/18   [name], MD  Insulin Pen Needle (B-D UF III MINI PEN NEEDLES) 31G  X 5 MM MISC USE TWICE A DAY 11/22/17   Neva Seat, MD  lisinopril (PRINIVIL,ZESTRIL) 10 MG tablet Take 10 mg  by mouth daily. 06/25/18   [provider]  Melatonin 3 MG TABS Take 3 mg by mouth at bedtime as needed (sleep).    [provider]  OXYGEN Inhale 2 L into the lungs continuous.    [provider]  pantoprazole (PROTONIX) 40 MG tablet Take 1 tablet (40 mg total) by mouth 2 (two) times daily. 05/18/18 07/17/18  Ina Homes, MD  simethicone (GAS-X) 80 MG chewable tablet Chew 1 tablet (80 mg total) by mouth 4 (four) times daily as needed for flatulence. 06/24/18 06/24/19  Alphonzo Grieve, MD  VICTOZA 18 MG/3ML SOPN INJECT 1.8 MILLIGRAM SUBCUTANEOUSLY DAILY Patient taking differently: Inject 1.8 mg into the skin daily.  05/21/18   Neva Seat, MD    Inpatient medications: . atorvastatin  40 mg Oral Q2000  . heparin  5,000 Units Subcutaneous Q8H  . insulin aspart  0-5 Units Subcutaneous QHS  . insulin aspart  0-9 Units Subcutaneous TID WC  . insulin aspart  4 Units Subcutaneous TID WC  . insulin detemir  18 Units Subcutaneous QHS  . pantoprazole  40 mg Oral BID    Discontinued Meds:   Medications Discontinued During This Encounter  Medication Reason  . Melatonin TABS 3 mg     Social History:  reports that she has quit smoking. Her smoking use included cigarettes. She started smoking about 6 weeks ago. She has a 6.60 pack-year smoking history. She has never used smokeless tobacco. She reports that she does not drink alcohol or use drugs.  Family History:   Family History  Problem Relation Age of Onset  . Cancer Mother        colon cancer  . Multiple sclerosis Father   . Diabetes Father   . Asthma Sister        in childhood  . Hypertension Other     Pertinent items are noted in HPI. Weight change:   Intake/Output Summary (Last 24 hours) at 06/28/2018 1407 Last data filed at 06/28/2018 1000 Gross per 24 hour  Intake 360 ml  Output -  Net 360 ml   BP (!) 95/55 (BP Location: Left Arm)   Pulse 77   Temp 98.2 F (36.8 C) (Oral)   Resp 18   Ht 5'  1" (1.549 m)   Wt 95.7 kg   SpO2 94%   BMI 39.86 kg/m  Vitals:   06/28/18 0343 06/28/18 0819 06/28/18 1133 06/28/18 1137  BP: (!) 84/46 (!) 92/59 (!) 95/55   Pulse: 75 71 74 77  Resp: 16 20 19 18   Temp: 98.3 F (36.8 C) 97.8 F (36.6 C) 98.2 F (36.8 C)   TempSrc: Oral Oral Oral   SpO2: 92% 90% 92% 94%  Weight:      Height:         General appearance: no distress, morbidly obese and slowed mentation Head: Normocephalic, without obvious abnormality, atraumatic Eyes: negative findings: lids and lashes normal, conjunctivae and sclerae normal and corneas clear Neck: no adenopathy, no carotid bruit, supple, symmetrical, trachea midline and thyroid not enlarged, symmetric, no tenderness/mass/nodules Resp: rales bibasilar Cardio: regular rate and rhythm and no rub GI: soft, non-tender; bowel sounds normal; no masses,  no organomegaly and obese Extremities: edema 1+ edema of left leg, trace on right  Labs: Basic Metabolic Panel: Recent Labs  Lab 06/24/18 1628 06/26/2018 1320 06/24/2018 1343  06/28/18 0231  NA 144 140  --  138  K 3.9 4.3  --  5.1  CL 102 103  --  101  CO2 26 25  --  28  GLUCOSE 46* 85  --  148*  BUN 27* 52*  --  56*  CREATININE 3.23* 7.05* 7.00* 7.77*  ALBUMIN  --  2.8*  --  2.7*  CALCIUM 8.2* 7.6*  --  7.4*   Liver Function Tests: Recent Labs  Lab 06/12/2018 1320 06/28/18 0231  AST 28 23  ALT 38 33  ALKPHOS 174* 164*  BILITOT 0.5 0.9  PROT 7.2 6.6  ALBUMIN 2.8* 2.7*   No results for input(s): LIPASE, AMYLASE in the last 168 hours. No results for input(s): AMMONIA in the last 168 hours. CBC: Recent Labs  Lab 06/24/18 1628 06/09/2018 1320 06/28/18 0231  WBC 9.3 10.0 11.5*  NEUTROABS  --  7.4  --   HGB 9.3* 9.0* 8.2*  HCT 30.2* 32.0* 29.9*  MCV 92 100.3* 101.4*  PLT 201 236 260   PT/INR: @LABRCNTIP (inr:5) Cardiac Enzymes: )No results for input(s): CKTOTAL, CKMB, CKMBINDEX, TROPONINI in the last 168 hours. CBG: Recent Labs  Lab 06/06/2018 1903  06/01/2018 2120 06/28/18 0821  GLUCAP 75 97 105*    Iron Studies: No results for input(s): IRON, TIBC, TRANSFERRIN, FERRITIN in the last 168 hours.  Xrays/Other Studies: Dg Chest 2 View  Result Date: 06/18/2018 CLINICAL DATA:  Cough.  Shortness of breath. EXAM: CHEST - 2 VIEW COMPARISON:  Chest x-ray 06/26/2018. FINDINGS: Tracheostomy tube noted with its tip over the trachea stable position. Cardiomegaly again noted. Progressive bibasilar atelectasis. Bibasilar infiltrates/edema can not be excluded. Small bilateral pleural effusions. No pneumothorax. No acute bony abnormality. IMPRESSION: 1.  Tracheostomy tube in stable position. 2. Progressive bibasilar atelectasis. Bibasilar infiltrates/edema can not be excluded. Small bilateral pleural effusions. Electronically Signed   By: Marcello Moores  Register   On: 06/28/2018 14:24   Mr Brain Wo Contrast  Result Date: 06/11/2018 CLINICAL DATA:  Dizziness and sensorineural hearing loss. EXAM: MRI HEAD WITHOUT CONTRAST TECHNIQUE: Multiplanar, multiecho pulse sequences of the brain and surrounding structures were obtained without intravenous contrast. COMPARISON:  None. FINDINGS: Examination is degraded by motion. BRAIN: There is no acute infarct, acute hemorrhage, hydrocephalus or extra-axial collection. The midline structures are normal. No midline shift or other mass effect. Multifocal white matter hyperintensity, most commonly due to chronic ischemic microangiopathy. The cerebral and cerebellar volume are age-appropriate. Susceptibility-sensitive sequences show no chronic microhemorrhage or superficial siderosis. VASCULAR: Major intracranial arterial and venous sinus flow voids are normal. SKULL AND UPPER CERVICAL SPINE: Calvarial bone marrow signal is normal. There is no skull base mass. Visualized upper cervical spine and soft tissues are normal. SINUSES/ORBITS: Right-greater-than-left mastoid effusions. The orbits are normal. IMPRESSION: 1. Motion degraded  examination without acute intracranial abnormality. 2. Mild findings of chronic small vessel disease. 3. Right-greater-than-left mastoid effusions, which could contribute to dizziness. Electronically Signed   By: Ulyses Jarred M.D.   On: 06/15/2018 15:43   US Renal  Result Date: 06/28/2018 CLINICAL DATA:  48 year old female with acute kidney injury. EXAM: RENAL / URINARY TRACT ULTRASOUND COMPLETE COMPARISON:  Renal ultrasound 06/13/2018. CT Abdomen and Pelvis 05/03/2018. FINDINGS: Right Kidney: Renal measurements: 11.0 x 5.8 x 6.8 centimeters = volume: 226 mL. Increased right renal cortical echogenicity relative to the liver redemonstrated (image 13). No hydronephrosis. Simple appearing 18 millimeter midpole cyst (image 24). Similar simple appearing 16 millimeter upper pole cyst. No solid right renal mass.  Left Kidney: Renal measurements: 10.9 x 6.7 x 6.1 centimeters = volume: 232 mL. Increased renal cortical echogenicity. Simple appearing 32 millimeter midpole cyst (image 39). No left hydronephrosis or solid mass. Bladder: Diminutive.  Appears normal for degree of bladder distention. IMPRESSION: Chronic medical renal disease and simple appearing bilateral renal cysts re-demonstrated with no acute findings. Electronically Signed   By: Genevie Ann M.D.   On: 06/28/2018 11:52     Assessment/Plan: 1.  AKI/CKD stage 4- profound increase in Cr in short period of time.  No inciting etiology but appears to be in decompensated CHF, so cardiorenal syndrome a high likelihood.  Also on DDx is ischemic ATN due to hypotension with concomitant ACE inhibition.  Will start IV lasix and follow.  She is a very poor candidate for dialysis given her morbid obesity, severe pulmonary HTN, chronic hypoxic respiratory failure, CMP, and tracheostomy among her many end-stage, irreversible disease processes.  Would recommend palliative care consult to help set goals/limits of care. 1. Will check FeNa, CK levels, and UA 2. Renal US  without obstruction and bladder scan with only 92 ml of urine 2. CHF- + edema on left leg and CXR with pleural effusions and bibasilar infiltrates.  Start IV lasix and follow 3. Hypotension- possibly related to CHF.  Will follow with diuresis.  She has a high probability of requiring transfer to the ICU given her chronic medical conditions. 4. Hyperkalemia- due to #1 5. Anemia of CKD stage 4- will check iron stores and start ESA.  Transfuse prn  6. Chronic hypoxic respiratory failure- check abg to r/o hypercarbia. 7. Dizziness- possibly related to mastoid infection   Melody Gray 06/28/2018, 2:07 PM

## 2018-06-29 DIAGNOSIS — H9193 Unspecified hearing loss, bilateral: Secondary | ICD-10-CM

## 2018-06-29 DIAGNOSIS — Z7189 Other specified counseling: Secondary | ICD-10-CM

## 2018-06-29 DIAGNOSIS — E11649 Type 2 diabetes mellitus with hypoglycemia without coma: Secondary | ICD-10-CM

## 2018-06-29 DIAGNOSIS — D539 Nutritional anemia, unspecified: Secondary | ICD-10-CM

## 2018-06-29 DIAGNOSIS — E875 Hyperkalemia: Secondary | ICD-10-CM

## 2018-06-29 DIAGNOSIS — Z515 Encounter for palliative care: Secondary | ICD-10-CM

## 2018-06-29 LAB — RENAL FUNCTION PANEL
Albumin: 2.7 g/dL — ABNORMAL LOW (ref 3.5–5.0)
Albumin: 2.7 g/dL — ABNORMAL LOW (ref 3.5–5.0)
Anion gap: 14 (ref 5–15)
Anion gap: 12 (ref 5–15)
BUN: 67 mg/dL — ABNORMAL HIGH (ref 6–20)
BUN: 81 mg/dL — ABNORMAL HIGH (ref 6–20)
CO2: 25 mmol/L (ref 22–32)
CO2: 23 mmol/L (ref 22–32)
Calcium: 7.3 mg/dL — ABNORMAL LOW (ref 8.9–10.3)
Calcium: 7.2 mg/dL — ABNORMAL LOW (ref 8.9–10.3)
Chloride: 101 mmol/L (ref 98–111)
Chloride: 103 mmol/L (ref 98–111)
Creatinine, Ser: 9.14 mg/dL — ABNORMAL HIGH (ref 0.44–1.00)
Creatinine, Ser: 9.49 mg/dL — ABNORMAL HIGH (ref 0.44–1.00)
GFR calc Af Amer: 5 mL/min — ABNORMAL LOW (ref >60)
GFR calc Af Amer: 5 mL/min — ABNORMAL LOW (ref >60)
GFR calc non Af Amer: 5 mL/min — ABNORMAL LOW (ref >60)
GFR calc non Af Amer: 4 mL/min — ABNORMAL LOW (ref >60)
Glucose, Bld: 93 mg/dL (ref 70–99)
Glucose, Bld: 172 mg/dL — ABNORMAL HIGH (ref 70–99)
Phosphorus: 10.2 mg/dL — ABNORMAL HIGH (ref 2.5–4.6)
Phosphorus: 10 mg/dL — ABNORMAL HIGH (ref 2.5–4.6)
Potassium: 5.5 mmol/L — ABNORMAL HIGH (ref 3.5–5.1)
Potassium: 5.3 mmol/L — ABNORMAL HIGH (ref 3.5–5.1)
Sodium: 140 mmol/L (ref 135–145)
Sodium: 138 mmol/L (ref 135–145)

## 2018-06-29 LAB — CBC
HCT: 29.7 % — ABNORMAL LOW (ref 36.0–46.0)
Hemoglobin: 7.9 g/dL — ABNORMAL LOW (ref 12.0–15.0)
MCH: 27.1 pg (ref 26.0–34.0)
MCHC: 26.6 g/dL — ABNORMAL LOW (ref 30.0–36.0)
MCV: 101.7 fL — ABNORMAL HIGH (ref 80.0–100.0)
Platelets: 259 10*3/uL (ref 150–400)
RBC: 2.92 MIL/uL — ABNORMAL LOW (ref 3.87–5.11)
RDW: 17 % — ABNORMAL HIGH (ref 11.5–15.5)
WBC: 10.3 10*3/uL (ref 4.0–10.5)
nRBC: 1.5 % — ABNORMAL HIGH (ref 0.0–0.2)

## 2018-06-29 LAB — GLUCOSE, CAPILLARY
Glucose-Capillary: 57 mg/dL — ABNORMAL LOW (ref 70–99)
Glucose-Capillary: 110 mg/dL — ABNORMAL HIGH (ref 70–99)
Glucose-Capillary: 119 mg/dL — ABNORMAL HIGH (ref 70–99)
Glucose-Capillary: 209 mg/dL — ABNORMAL HIGH (ref 70–99)
Glucose-Capillary: 191 mg/dL — ABNORMAL HIGH (ref 70–99)

## 2018-06-29 LAB — CREATININE, URINE, RANDOM: Creatinine, Urine: 365.73 mg/dL

## 2018-06-29 LAB — SODIUM, URINE, RANDOM: Sodium, Ur: 23 mmol/L

## 2018-06-29 MED ORDER — FLUCONAZOLE 150 MG PO TABS
150.0000 mg | ORAL_TABLET | Freq: Once | ORAL | Status: AC
  Administered 2018-06-29: 150 mg via ORAL
  Filled 2018-06-29: qty 1

## 2018-06-29 MED ORDER — SODIUM CHLORIDE 0.9 % IV SOLN: INTRAVENOUS | Status: AC

## 2018-06-29 MED ORDER — SODIUM ZIRCONIUM CYCLOSILICATE 10 G PO PACK
10.0000 g | PACK | Freq: Every day | ORAL | Status: DC
  Administered 2018-06-29 – 2018-06-30 (×2): 10 g via ORAL
  Filled 2018-06-29 (×2): qty 1

## 2018-06-29 MED ORDER — SODIUM POLYSTYRENE SULFONATE 15 GM/60ML PO SUSP
30.0000 g | Freq: Once | ORAL | Status: DC
  Filled 2018-06-29: qty 120

## 2018-06-29 MED ORDER — FUROSEMIDE 10 MG/ML IJ SOLN: 120.0000 mg | Freq: Two times a day (BID) | INTRAVENOUS | Status: DC

## 2018-06-29 MED ORDER — SODIUM CHLORIDE 0.9 % IV SOLN
INTRAVENOUS | Status: AC
  Administered 2018-06-29: 100 mL/h via INTRAVENOUS

## 2018-06-29 NOTE — Consult Note (Signed)
Consultation Note Date: 06/29/2018   Patient Name: Melody Gray  DOB: Dec 23, 1970  MRN: [id number]  Age / Sex: 48 y.o., female  PCP: Neva Seat, MD Referring Physician: Oda Kilts, MD  Reason for Consultation: Establishing goals of care and Psychosocial/spiritual support  HPI/Patient Profile: 48 y.o. female  with past medical history of chronic kidney disease stage IV, sarcoidosis (lung and liver; per patient, diagnosed by Dr. Melvyn Novas), obstructive sleep apnea, pulmonary hypertension, O2 dependency, diabetes type 2, diastolic heart failure grade 1, trach dependency, history of acute cardiac arrest on 05/03/2018, history of left calf fasciotomy/compartment syndrome, history of MRSA bacteremia admitted on 06/26/2018 with acute dizziness and hearing loss x4 days.  Patient has had multiple admissions in 2020 secondary to acute respiratory failure.  During  hospitalizationin January, she had a  cardiac arrest, subsequently developed compartment syndrome to left calf from venous access line placement requiring a fasciotomy.  She is now in with an acute elevation of her creatinine.  Creatinine on admission was 7.05.  As of 06/29/2018 it is now 9.14, potassium 5.5  Consult ordered for goals of care.   Clinical Assessment and Goals of Care: Patient seen, chart reviewed.  No family at the bedside currently but patient is able to speak for herself.  Despite having multiple admissions in 2020 as well as 2019 , multiple chronic medical issues over a number of years, patient is very overwhelmed by the acuity of her current condition specifically being told that she is in renal failure.  When I attempted to discuss this with her  she becomes very overwhelmed, tearful and repeats "I do not know, I do not know".  I did ask [name] if she had ever discussed with her family advanced directives and she was unable to  make eye contact with me or even to discuss this further.  We did talk about her renal failure and how she would not be able to tolerate hemodialysis in the community; there would be more risk than benefit because of her underlying sarcoidosis and severe pulmonary disease.  She seems to understand but she did state that if short-term dialysis was offered in the hospital setting she would be willing to undergo that in order to have more time to process what is going on and id necessary, prepare her family .  Patient does not have a designated healthcare power of attorney.  She is unmarried, she reports she has a significant other.  She also has 2 adult children, a son and daughter.  She describes her relationship with her sister, Melody Gray as 1 of her chief sources of support.  She states were she able to complete an advanced directive today, she would name her as her healthcare power of attorney.  She becomes very tearful and states "they tell me I am dying".  I asked her if she surprised to hear this and she stated yes.  I also asked her if she has become tired, given how long she has been sick and how  many hospitalizations she has gone through.  She  did verbalize that she has had thoughts at times about no further treatment but states that at this point she does not know which direction to go, or even what her choices are.   Patient at this point is able to speak for herself.  I am concerned however that she could rapidly be unable to do this.  She verbalizes that she would like for her sister Melody Gray to be her healthcare proxy.  However, she has 2 adult children who by  [facility] law would supersede Nadine.  I discussed with [name] the plan for tonight was to increase fluids and check her labs in the morning  SUMMARY OF RECOMMENDATIONS   Continue full scope of treatment I have attempted to reach patient's sister, Melody Gray.  I have left her a message with the goal of arranging a family  meeting If short-term dialysis was offered to patient in ICU setting, she would undergo this Palliative medicine to stay involved and work with patient and family on goals of care Will place spiritual care consult to arrange healthcare POA designation Code Status/Advance Care Planning:  Full code    Palliative Prophylaxis:   Aspiration, Bowel Regimen, Delirium Protocol, Eye Care, Frequent Pain Assessment, Oral Care and Turn Reposition  Additional Recommendations (Limitations, Scope, Preferences):  Full Scope Treatment  Psycho-social/Spiritual:   Desire for further Chaplaincy support:no   Prognosis:   Unable to determine but patient likely would qualify for her hospice benefit both in her home as well as if her creatinine continues to increase and dialysis is not an option, possibly residential hospice as well.  Discharge Planning: To Be Determined      Primary Diagnoses: Present on Admission: . AKI (acute kidney injury) ([place]) . HTN (hypertension), benign . Sleep apnea . Chronic diastolic heart failure, grade 2 . CKD (chronic kidney disease) stage 4, GFR 15-29 ml/min (HCC)   I have reviewed the medical record, interviewed the patient and family, and examined the patient. The following aspects are pertinent.  Past Medical History:  Diagnosis Date  . Abnormal LFTs (liver function tests)    Liver U/S and exam c/w HSM. Hep B serology neg. but Hep C ab +, HIV neg. 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 Acute kidney injury (AKI) with acute tubular necrosis (ATN) (HCC)   . Acute on chronic respiratory failure with hypoxemia ([place]) 05/05/2014  . Acute renal failure superimposed on stage 4 chronic kidney disease ([place])   . Acute respiratory failure ([place])   . Acute respiratory failure with hypoxia ([place])   . AKI (acute kidney injury) ([place]) 05/10/2011  . Anemia   . Aspiration pneumonia ([place]) 11/22/2013  . Cardiac arrest ([place])  05/03/2018  . Cardiomyopathy, nonischemic ([place])    a. Varying EF over the years - initially 35% in 09/2010. Normal cors 12/2010. b. Echo 07/2014: EF 55-60%, no RWMA, + diastolic flattening and systolic flattening c/w RV volume and pressure overload, mild LAE/RAE, mod dilated RV, mod TR, PASP severely increased at 88mmHg. c. RHC 07/2014: moderate pulmonary HTN likely WHO group 3 with markedly elevated CVP and relatively normal left sided pressures.  . Chronic diastolic CHF (congestive heart failure) ([place])   . Chronic respiratory failure ([place])    a. became O2 dependent in July 2011. b. She required trache placement in 07/2013 and has been followed by pulmonology as well.  Marland Kitchen  CKD (chronic kidney disease), stage II   . Complication of anesthesia    " difficult waking "  . Diabetes mellitus    insulin dependent  . Diabetic retinopathy    Right eye 2/11  . Difficult airway   . Dyspnea   . Essential hypertension   . Health maintenance examination    Mammogram 05/2010 Negative; Last Pap smear 03/2008; Last DM eye exam 2/11> mild non-proliferative diabetic retinopathy. OD  . Helicobacter pylori ab+ 05/2011   Pt was symptomatic and treatment planned for 05/2011  . Hx of cardiac cath 2/08   No CAD, no RAS,  normal EF  . Hypoxemia    CT angiogram 9/11>> No PE; PFTs 10/11- FEV1 1.20 (49%) with 16% better p B2, DLCO 33%> corrects to 84; O2 sats ok on 4 lpm X rapid walk X 3 laps 05/2010  . Long term current use of systemic steroids   . Morbid obesity ([place])    Target wt= 153 for BMI <30  . MRSA bacteremia 05/06/2018  . Obesity hypoventilation syndrome ([place])   . Pulmonary hypertension ([place])   . Pulmonary hypertension associated with sarcoidosis ([place]) 10/05/2010   Followed in Pulmonary clinic/ [facility]/ [place]   - repeat echo 10/14/10 LV much worse than RV so rx as L Ht failure by cards/[place] - CTa 07/25/13 ? Very small pe vs artifact on L > venous dopplers 07/28/2013 neg bilaterally  - Echo 08/10/13 The RV was  dialted and severely hyponetic and there was a D-shaped septum suggestive of PAH with RV pressure volume overload.    . QT prolongation   . Sarcoidosis    Followed by Dr. Melvyn Novas; w/ liver involvement per biopsy 12/09, Reversible airway component so started on [facility] 01/2010; HFA 75% p coaching 05/2010  . Seborrheic dermatitis of scalp   . Sleep apnea   . Tobacco abuse    Social History   Socioeconomic History  . Marital status: Single    Spouse name: Not on file  . Number of children: 2  . Years of education: Not on file  . Highest education level: Not on file  Occupational History  . Occupation: works on a school bus Garment/textile technologist: UNEMPLOYED  Social Needs  . Financial resource strain: Not on file  . Food insecurity:    Worry: Not on file    Inability: Not on file  . Transportation needs:    Medical: Not on file    Non-medical: Not on file  Tobacco Use  . Smoking status: Former Smoker    Packs/day: 0.33    Years: 20.00    Pack years: 6.60    Types: Cigarettes    Start date: 05/17/2018  . Smokeless tobacco: Never Used  . Tobacco comment: quit Jan 2020  Substance and Sexual Activity  . Alcohol use: No    Alcohol/week: 0.0 standard drinks  . Drug use: No  . Sexual activity: Yes    Partners: Female    Birth control/protection: None  Lifestyle  . Physical activity:    Days per week: Not on file    Minutes per session: Not on file  . Stress: Not on file  Relationships  . Social connections:    Talks on phone: Not on file    Gets together: Not on file    Attends religious service: Not on file    Active member of club or organization: Not on file    Attends meetings of clubs or organizations:  Not on file    Relationship status: Not on file  Other Topics Concern  . Not on file  Social History Narrative   Diabetic card given 05/03/2010.   Financial assistance approved for 100% discount at [facility] and has [facility] card. [name] 12/07/2009.      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   Family History  Problem Relation Age of Onset  . Cancer Mother        colon cancer  . Multiple sclerosis Father   . Diabetes Father   . Asthma Sister        in childhood  . Hypertension Other    Scheduled Meds: . atorvastatin  40 mg Oral Q2000  . heparin  5,000 Units Subcutaneous Q8H  . insulin aspart  0-5 Units Subcutaneous QHS  . insulin aspart  0-9 Units Subcutaneous TID WC  . insulin aspart  4 Units Subcutaneous TID WC  . pantoprazole  40 mg Oral BID  . sodium polystyrene  30 g Oral Once  . sodium zirconium cyclosilicate  10 g Oral Daily   Continuous Infusions: . sodium chloride 100 mL/hr (06/29/18 1444)   PRN Meds:.acetaminophen **OR** acetaminophen, Melatonin, simethicone Medications Prior to Admission:  Prior to Admission medications   Medication Sig Start Date End Date Taking? Authorizing Provider  amLODipine (NORVASC) 10 MG tablet Take 1 tablet (10 mg total) by mouth daily. 01/22/18 01/22/19  Alphonzo Grieve, MD  atorvastatin (LIPITOR) 40 MG tablet Take 1 tablet (40 mg total) by mouth daily. 01/17/18   Neva Seat, MD  BD INSULIN SYRINGE U/F 30G X 1/2" 0.5 ML MISC USE AS DIRECTED 04/16/18   Neva Seat, MD  carvedilol (COREG) 6.25 MG tablet Take 1 tablet (6.25 mg total) by mouth 2 (two) times daily. 07/23/17   Lorella Nimrod, MD  ferrous sulfate 325 (65 FE) MG tablet Take 1 tablet (325 mg total) by mouth daily with breakfast. 06/20/18   [name], MD  glucose blood test strip Use to check blood sugar 3 times daily. Dx code E11.65. Insulin dependent 05/25/18   Neva Seat, MD  insulin detemir (LEVEMIR) 100 UNIT/ML injection Inject 0.46 mLs (46 Units total) into the skin at bedtime. ADMINISTER 50 UNITS UNDER THE SKIN AT BEDTIME Patient taking differently: Inject 46 Units into the skin at bedtime.  06/20/18   [name], MD  Insulin Pen Needle (B-D UF III MINI PEN NEEDLES) 31G X 5 MM MISC USE TWICE A DAY  11/22/17   Neva Seat, MD  lisinopril (PRINIVIL,ZESTRIL) 10 MG tablet Take 10 mg by mouth daily. 06/25/18   [provider]  Melatonin 3 MG TABS Take 3 mg by mouth at bedtime as needed (sleep).    [provider]  OXYGEN Inhale 2 L into the lungs continuous.    [provider]  pantoprazole (PROTONIX) 40 MG tablet Take 1 tablet (40 mg total) by mouth 2 (two) times daily. 05/18/18 07/17/18  Ina Homes, MD  simethicone (GAS-X) 80 MG chewable tablet Chew 1 tablet (80 mg total) by mouth 4 (four) times daily as needed for flatulence. 06/24/18 06/24/19  Alphonzo Grieve, MD  VICTOZA 18 MG/3ML SOPN INJECT 1.8 MILLIGRAM SUBCUTANEOUSLY DAILY Patient taking differently: Inject 1.8 mg into the skin daily.  05/21/18   Neva Seat, MD   Allergies  Allergen Reactions  . Vicodin [Hydrocodone-Acetaminophen] Itching   Review of Systems  Unable to perform ROS: Other    Physical Exam  Vitals signs and nursing note reviewed.  Constitutional:      Appearance: She is obese. She is ill-appearing.     Comments: Ill-appearing middle-aged female; alert, conversant  HENT:     Head: Normocephalic and atraumatic.  Cardiovascular:     Rate and Rhythm: Normal rate.  Pulmonary:     Comments: Tracheostomy to trach collar Skin:    General: Skin is warm and dry.  Neurological:     Mental Status: She is oriented to person, place, and time.  Psychiatric:     Comments: Patient very emotional, anxious Thought processes are linear and goal-directed however she is very overwhelmed having difficulty processing difficulty information secondary to her clinical decline     Vital Signs: BP 117/61 (BP Location: Left Arm)   Pulse 89   Temp 99.1 F (37.3 C) (Oral)   Resp 19   Ht 5\' 1"  (1.549 m)   Wt 95.7 kg   SpO2 94%   BMI 39.86 kg/m  Pain Scale: 0-10   Pain Score: 0-No pain   SpO2: SpO2: 94 % O2 Device:SpO2: 94 % O2 Flow Rate: .O2 Flow Rate (L/min): 10 L/min  IO:  Intake/output summary: No intake or output data in the 24 hours ending 06/29/18 1652  LBM: Last BM Date: 06/19/18 Baseline Weight: Weight: 95.7 kg Most recent weight: Weight: 95.7 kg     Palliative Assessment/Data:   Flowsheet Rows     Most Recent Value  Intake Tab  Referral Department  Hospitalist  Unit at Time of Referral  Med/Surg Unit  Palliative Care Primary Diagnosis  Nephrology  Date Notified  06/29/18  Palliative Care Type  New Palliative care  Reason for referral  Clarify Goals of Care, Psychosocial or Spiritual support  Date of Admission  06/16/2018  Date first seen by Palliative Care  06/29/18  # of days Palliative referral response time  0 Day(s)  # of days IP prior to Palliative referral  2  Clinical Assessment  Palliative Performance Scale Score  40%  Pain Max last 24 hours  Not able to report  Pain Min Last 24 hours  Not able to report  Dyspnea Max Last 24 Hours  Not able to report  Dyspnea Min Last 24 hours  Not able to report  Nausea Max Last 24 Hours  Not able to report  Nausea Min Last 24 Hours  Not able to report  Anxiety Max Last 24 Hours  Not able to report  Anxiety Min Last 24 Hours  Not able to report  Other Max Last 24 Hours  Not able to report  Psychosocial & Spiritual Assessment  Palliative Care Outcomes  Patient/Family meeting held?  Yes  Who was at the meeting?  pt,  called and LM for sister  Palliative Care Outcomes  Provided psychosocial or spiritual support  Palliative Care follow-up planned  Yes, Facility      Time In: 1600 Time Out: 1720 Time Total: 80 min Greater than 50%  of this time was spent counseling and coordinating care related to the above assessment and plan. Staffed with Dr. Gilberto Better, Dr. Gilles Chiquito and Dr. Marval Regal Signed by: Dory Horn, NP   Please contact Palliative Medicine Team phone at [phone] for questions and concerns.  For individual provider: See Shea Evans

## 2018-06-29 NOTE — Progress Notes (Signed)
Spoke to pt daughter, Junie Panning she stated she and pt sister's Justice Rocher could meet with palliative RN at 1100 Sunday morning 06/30/18

## 2018-06-29 NOTE — Progress Notes (Signed)
  Date: 06/29/2018  Patient name: Melody Gray  Medical record number: [id number]  Date of birth: 1970-12-12   I have seen and evaluated this patient and I have discussed the plan of care with the house staff. Please see Dr. Marguerita Beards note for complete details. I concur with his findings.   Also reviewed Dr. Birdie Riddle note.  Residency team discussed with nephrology.  We will try a small dose of fluids and recheck Cr.  If improved, she may benefit from a trial of fluids to improve renal function.  However, if no improvement, will trial a higher dose of IV lasix.  Concern for cardiorenal syndrome and inability to get fluids off if she is overloaded as she has oliguria and rising Cr.  Palliative care will be consulted.  Family is aware of complicated situation.    Sid Falcon, MD 06/29/2018, 3:12 PM

## 2018-06-29 NOTE — Progress Notes (Signed)
Patient ID: Melody Gray, female   DOB: [birth year], 48 y.o.   MRN: [id number] S: Worsening renal function overnight along with hyperkalemia.  She denies any N/V or shortness of breath. O:BP 121/67   Pulse 88   Temp 99.4 F (37.4 C) (Oral)   Resp 19   Ht 5\' 1"  (1.549 m)   Wt 95.7 kg   SpO2 97%   BMI 39.86 kg/m  No intake or output data in the 24 hours ending 06/29/18 1003 Intake/Output: I/O last 3 completed shifts: In: 360 [P.O.:360] Out: -   Intake/Output this shift:  No intake/output data recorded. Weight change:  Gen: obese AAF sitting in bed in NAd CVS: RRR, distant heart sounds, no rub Resp: bibasilar crackles and occ rhonchi Abd: obese, +BS, nontender Ext: 2+ edema on left leg, trace presacral edema  Recent Labs  Lab 06/24/18 1628 06/03/2018 1320 06/14/2018 1343 06/28/18 0231 06/29/18 0225  NA 144 140  --  138 140  K 3.9 4.3  --  5.1 5.5*  CL 102 103  --  101 101  CO2 26 25  --  28 25  GLUCOSE 46* 85  --  148* 93  BUN 27* 52*  --  56* 67*  CREATININE 3.23* 7.05* 7.00* 7.77* 9.14*  ALBUMIN  --  2.8*  --  2.7* 2.7*  CALCIUM 8.2* 7.6*  --  7.4* 7.3*  PHOS  --   --   --   --  10.2*  AST  --  28  --  23  --   ALT  --  38  --  33  --    Liver Function Tests: Recent Labs  Lab 06/04/2018 1320 06/28/18 0231 06/29/18 0225  AST 28 23  --   ALT 38 33  --   ALKPHOS 174* 164*  --   BILITOT 0.5 0.9  --   PROT 7.2 6.6  --   ALBUMIN 2.8* 2.7* 2.7*   No results for input(s): LIPASE, AMYLASE in the last 168 hours. No results for input(s): AMMONIA in the last 168 hours. CBC: Recent Labs  Lab 06/24/18 1628 06/11/2018 1320 06/28/18 0231 06/29/18 0225  WBC 9.3 10.0 11.5* 10.3  NEUTROABS  --  7.4  --   --   HGB 9.3* 9.0* 8.2* 7.9*  HCT 30.2* 32.0* 29.9* 29.7*  MCV 92 100.3* 101.4* 101.7*  PLT 201 236 260 259   Cardiac Enzymes: Recent Labs  Lab 06/28/18 1711  CKTOTAL 65   CBG: Recent Labs  Lab 06/28/18 0821 06/28/18 1640 06/28/18 2236 06/29/18 0748  06/29/18 0834  GLUCAP 105* 135* 129* 57* 110*    Iron Studies:  Recent Labs    06/28/18 1711  IRON 40  TIBC 365  FERRITIN 56   Studies/Results: Dg Chest 2 View  Result Date: 06/15/2018 CLINICAL DATA:  Cough.  Shortness of breath. EXAM: CHEST - 2 VIEW COMPARISON:  Chest x-ray 06/26/2018. FINDINGS: Tracheostomy tube noted with its tip over the trachea stable position. Cardiomegaly again noted. Progressive bibasilar atelectasis. Bibasilar infiltrates/edema can not be excluded. Small bilateral pleural effusions. No pneumothorax. No acute bony abnormality. IMPRESSION: 1.  Tracheostomy tube in stable position. 2. Progressive bibasilar atelectasis. Bibasilar infiltrates/edema can not be excluded. Small bilateral pleural effusions. Electronically Signed   By: Marcello Moores  Register   On: 06/25/2018 14:24   Mr Brain Wo Contrast  Result Date: 06/25/2018 CLINICAL DATA:  Dizziness and sensorineural hearing loss. EXAM: MRI HEAD WITHOUT CONTRAST TECHNIQUE: Multiplanar, multiecho pulse sequences of  the brain and surrounding structures were obtained without intravenous contrast. COMPARISON:  None. FINDINGS: Examination is degraded by motion. BRAIN: There is no acute infarct, acute hemorrhage, hydrocephalus or extra-axial collection. The midline structures are normal. No midline shift or other mass effect. Multifocal white matter hyperintensity, most commonly due to chronic ischemic microangiopathy. The cerebral and cerebellar volume are age-appropriate. Susceptibility-sensitive sequences show no chronic microhemorrhage or superficial siderosis. VASCULAR: Major intracranial arterial and venous sinus flow voids are normal. SKULL AND UPPER CERVICAL SPINE: Calvarial bone marrow signal is normal. There is no skull base mass. Visualized upper cervical spine and soft tissues are normal. SINUSES/ORBITS: Right-greater-than-left mastoid effusions. The orbits are normal. IMPRESSION: 1. Motion degraded examination without acute  intracranial abnormality. 2. Mild findings of chronic small vessel disease. 3. Right-greater-than-left mastoid effusions, which could contribute to dizziness. Electronically Signed   By: Ulyses Jarred M.D.   On: 06/10/2018 15:43   US Renal  Result Date: 06/28/2018 CLINICAL DATA:  48 year old female with acute kidney injury. EXAM: RENAL / URINARY TRACT ULTRASOUND COMPLETE COMPARISON:  Renal ultrasound 06/13/2018. CT Abdomen and Pelvis 05/03/2018. FINDINGS: Right Kidney: Renal measurements: 11.0 x 5.8 x 6.8 centimeters = volume: 226 mL. Increased right renal cortical echogenicity relative to the liver redemonstrated (image 13). No hydronephrosis. Simple appearing 18 millimeter midpole cyst (image 24). Similar simple appearing 16 millimeter upper pole cyst. No solid right renal mass. Left Kidney: Renal measurements: 10.9 x 6.7 x 6.1 centimeters = volume: 232 mL. Increased renal cortical echogenicity. Simple appearing 32 millimeter midpole cyst (image 39). No left hydronephrosis or solid mass. Bladder: Diminutive.  Appears normal for degree of bladder distention. IMPRESSION: Chronic medical renal disease and simple appearing bilateral renal cysts re-demonstrated with no acute findings. Electronically Signed   By: Genevie Ann M.D.   On: 06/28/2018 11:52   . atorvastatin  40 mg Oral Q2000  . heparin  5,000 Units Subcutaneous Q8H  . insulin aspart  0-5 Units Subcutaneous QHS  . insulin aspart  0-9 Units Subcutaneous TID WC  . insulin aspart  4 Units Subcutaneous TID WC  . insulin detemir  18 Units Subcutaneous QHS  . pantoprazole  40 mg Oral BID  . sodium polystyrene  30 g Oral Once  . sodium zirconium cyclosilicate  10 g Oral Daily    BMET    Component Value Date/Time   NA 140 06/29/2018 0225   NA 144 06/24/2018 1628   K 5.5 (H) 06/29/2018 0225   CL 101 06/29/2018 0225   CO2 25 06/29/2018 0225   GLUCOSE 93 06/29/2018 0225   BUN 67 (H) 06/29/2018 0225   BUN 27 (H) 06/24/2018 1628   CREATININE 9.14  (H) 06/29/2018 0225   CREATININE 1.90 (H) 01/06/2016 1317   CALCIUM 7.3 (L) 06/29/2018 0225   CALCIUM 9.5 07/28/2010 1133   GFRNONAA 5 (L) 06/29/2018 0225   GFRNONAA 55 (L) 09/25/2014 1350   GFRAA 5 (L) 06/29/2018 0225   GFRAA 63 09/25/2014 1350   CBC    Component Value Date/Time   WBC 10.3 06/29/2018 0225   RBC 2.92 (L) 06/29/2018 0225   HGB 7.9 (L) 06/29/2018 0225   HGB 9.3 (L) 06/24/2018 1628   HCT 29.7 (L) 06/29/2018 0225   HCT 30.2 (L) 06/24/2018 1628   PLT 259 06/29/2018 0225   PLT 201 06/24/2018 1628   MCV 101.7 (H) 06/29/2018 0225   MCV 92 06/24/2018 1628   MCH 27.1 06/29/2018 0225   MCHC 26.6 (L) 06/29/2018 0225  RDW 17.0 (H) 06/29/2018 0225   RDW 14.6 06/24/2018 1628   LYMPHSABS 1.7 06/22/2018 1320   LYMPHSABS 2.0 05/22/2018 1330   MONOABS 0.6 06/11/2018 1320   EOSABS 0.1 06/19/2018 1320   EOSABS 0.3 05/22/2018 1330   BASOSABS 0.0 06/21/2018 1320   BASOSABS 0.0 05/22/2018 1330      Assessment/Plan: 1.  AKI/CKD stage 4- profound increase in Cr in short period of time.  No inciting etiology but appears to be in decompensated CHF, so cardiorenal syndrome a high likelihood.  Also on DDx is ischemic ATN due to hypotension with concomitant ACE inhibition.  She is a very poor candidate for dialysis given her morbid obesity, severe pulmonary HTN, chronic hypoxic respiratory failure, CMP, and tracheostomy among her many end-stage, irreversible disease processes.  Would recommend palliative care consult to help set goals/limits of care. 1. Ordered urine studies, however none have been collected as she is anuric 2. Will check bladder scan again today and place foley if >200 ml in bladder. 3. Renal US without obstruction  4. Worsening renal function overnight and did not respond to high dose lasix.  Discussed case with house staff.  Very difficult situation.  She is a poor candidate for longterm dialysis given her multiple end-stage disease processes.  If she wants to pursue  dialysis will need to transfer her to ICU for catheter placement and initiate CVVHD given her hypotension and high likelihood of worsening with HD.  I discussed comfort care with the patient and she simply stated "I don't know". 2. CHF- + edema on left leg and CXR with pleural effusions and bibasilar infiltrates.  Given one dose of IV lasix last night without response.  Would dose again but at higher dose to help with hyperkalemia and volume 3. Hyperkalemia- as above, start lokelma and IV lasix and discuss with pt and family regarding CVVHD vs comfort care 4. Hypotension- possibly related to CHF.  Will follow with diuresis.  She has a high probability of requiring transfer to the ICU given her chronic medical conditions. 5. Anemia of CKD stage 4- will check iron stores and start ESA.  Transfuse prn  6. Chronic hypoxic respiratory failure- check abg to r/o hypercarbia. 7. Dizziness- possibly related to mastoid infection  Donetta Potts, [place] [phone]

## 2018-06-29 NOTE — Progress Notes (Addendum)
Subjective:  Keyunna Talishia Betzler is a 48 y.o. F with PMH of HFpEF, Trach-dependent OSH, HTN, CKD4, T2DM and chronic respiratory failure on 2L O2 [place] admit for AKI on hospital day 2  Mrs.Helget was examined and evaluated at bedside this AM. She was observed somnolent in bed. She is able to follow directions and answer questions and is intermittent observed drowsy. Continues to have difficulty hearing. She states she has no acute complaints at this time. Denies any headache, blurry vision, nausea, vomiting.   Had a discussion with Mrs.Apfel with her sister, Akera Snowberger at bedside again about goals of care. Discussed her tenuous status and worsening renal function with poor prognosis. Discussed her chronic respiratory disease and hypotension and risk of early demise if starting dialysis. Mrs.Lax and her sister expressed understanding. Mrs.Scalici repeatedly states 'she does not know. Her sister expressed that she thinks bringing her home would be best for her but she would like to respect Mrs.Diluzio's wishes. She states she would like her other family members, son and daughter, involved in the conversation as well and would like to have palliative team involved. All other questions and concerns addressed.  Objective:  Vital signs in last 24 hours: Vitals:   06/29/18 0326 06/29/18 0441 06/29/18 0746 06/29/18 0759  BP:  (!) 103/56 121/67 121/67  Pulse: 85 83 84 88  Resp: 20 18 19 19   Temp:  99.3 F (37.4 C) 99.4 F (37.4 C)   TempSrc:  Oral Oral   SpO2: 97% 94% 94% 97%  Weight:      Height:       Physical Exam  Constitutional:  Somnolent, not in acute distress  HENT:  Decreased hearing bilaterally  Cardiovascular: Normal rate, regular rhythm, normal heart sounds and intact distal pulses.  No murmur heard. Pulmonary/Chest: Effort normal. She has no wheezes. She has rales (bibasilar rales).  Abdominal: Soft. Bowel sounds are normal. There is no abdominal tenderness.  Musculoskeletal: Normal  range of motion.        General: Edema (chronic left leg 2+ pitting edema.) present.  Neurological: No cranial nerve deficit.  Somnolent, not oriented, able to follow directions but falling asleep during conversation.  Skin: Skin is warm and dry.   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 Now endorsing hyperkalemia with worsening renal function. Minimal urinary output despite 120mg  IV furosemide yesterday. On prior admission, she did not respond to diuretic therapy but improved with fluid resuscitation. May be appearing hypervolemic but intravascularly dry. Will attempt to fluid resuscitate before restarting diuretic therapy.  AKIon CKD Creatinine trend 7.0->7.77->9.14. Renal ultrasound showing chronic medical renal disease. Minimal urinary output overnight. Unclear if renal function will recover on its own like last time. 360 cc on bladder scan. Discussed tenuous condition with family present and family and patient agreeable to get palliative care involved. Will attempt gentle fluid resuscitation. Appear more somnolent this morning which may in part be due to uremia vs OSA. - NS 100  cc/hr for 5 hours - Appreciate nephro recs: poor candidate for dialysis, will need ICU admission for CVVHD if pursuing full scope of care - Straight I&O cath - F/u Urine sodium, creatinine - Trend renal panel  Dizziness and bilateral hearing loss Unclear etiology. MRI brain not showing stroke. Right greater than left  mastoid effusions - C/w monitor - Will consult ENT when encphalopathy is resolved  Chronic Diastolic Heart Failure AM BP 121/67 - Strict I&O - Daily weights - Holding anti-hypertensive in light of hypotension - C/w home med: lipitor 40mg  daily  Type 2 DM Episode of hypoglycemia this am at 57 - Glucose checks - SSI - D/C levemir in setting of AKI - Novolog 4 units TID qc  Chronic hypoxic respiratory failure with trach dependence Satting 95% on O2 through trach - Keep O2 sat >88 - Encourage trach O2 use daily  Macrocytic anemia 2/2 folate deficiency vs anemia of chronic disease Baseline hemoglobin ~9.0. MCV 101.7 HGB trend 9.0->8.2->7.9 CKD 4 at baseline prior to acute renal failure. B12 WNL at 410. Folate pending. Getting ESA per nephro. Ferritin 56. Iron sat 11% - Trend CBC - F/u folate - Transfuse if <7  DVT prophx: subqheparin Diet: Cardiac, Diabetic Bowel: Senokot Code: Full  Dispo: Anticipated discharge in approximately 2-3 day(s).   [name], MD 06/29/2018, 10:31 AM Pager: [phone]

## 2018-06-30 ENCOUNTER — Inpatient Hospital Stay (HOSPITAL_COMMUNITY): Payer: Medicare Other

## 2018-06-30 ENCOUNTER — Encounter (HOSPITAL_COMMUNITY): Payer: Self-pay | Admitting: *Deleted

## 2018-06-30 LAB — RENAL FUNCTION PANEL
Albumin: 2.7 g/dL — ABNORMAL LOW (ref 3.5–5.0)
Anion gap: 14 (ref 5–15)
BUN: 82 mg/dL — ABNORMAL HIGH (ref 6–20)
CO2: 20 mmol/L — ABNORMAL LOW (ref 22–32)
Calcium: 7.4 mg/dL — ABNORMAL LOW (ref 8.9–10.3)
Chloride: 104 mmol/L (ref 98–111)
Creatinine, Ser: 9.78 mg/dL — ABNORMAL HIGH (ref 0.44–1.00)
GFR calc Af Amer: 5 mL/min — ABNORMAL LOW (ref >60)
GFR calc non Af Amer: 4 mL/min — ABNORMAL LOW (ref >60)
Glucose, Bld: 106 mg/dL — ABNORMAL HIGH (ref 70–99)
Phosphorus: 10 mg/dL — ABNORMAL HIGH (ref 2.5–4.6)
Potassium: 5.5 mmol/L — ABNORMAL HIGH (ref 3.5–5.1)
Sodium: 138 mmol/L (ref 135–145)

## 2018-06-30 LAB — CBC
HCT: 27.7 % — ABNORMAL LOW (ref 36.0–46.0)
Hemoglobin: 7.8 g/dL — ABNORMAL LOW (ref 12.0–15.0)
MCH: 28.4 pg (ref 26.0–34.0)
MCHC: 28.2 g/dL — ABNORMAL LOW (ref 30.0–36.0)
MCV: 100.7 fL — ABNORMAL HIGH (ref 80.0–100.0)
Platelets: 241 10*3/uL (ref 150–400)
RBC: 2.75 MIL/uL — ABNORMAL LOW (ref 3.87–5.11)
RDW: 17.1 % — ABNORMAL HIGH (ref 11.5–15.5)
WBC: 9.2 10*3/uL (ref 4.0–10.5)
nRBC: 1.1 % — ABNORMAL HIGH (ref 0.0–0.2)

## 2018-06-30 LAB — GLUCOSE, CAPILLARY
Glucose-Capillary: 79 mg/dL (ref 70–99)
Glucose-Capillary: 128 mg/dL — ABNORMAL HIGH (ref 70–99)
Glucose-Capillary: 100 mg/dL — ABNORMAL HIGH (ref 70–99)

## 2018-06-30 MED ORDER — LORAZEPAM 2 MG/ML IJ SOLN
0.5000 mg | INTRAMUSCULAR | Status: DC | PRN
  Filled 2018-06-30: qty 1

## 2018-06-30 MED ORDER — HYDROMORPHONE HCL 1 MG/ML IJ SOLN
0.5000 mg | INTRAMUSCULAR | Status: DC | PRN
  Administered 2018-06-30 – 2018-07-01 (×4): 0.5 mg via INTRAVENOUS
  Filled 2018-06-30 (×4): qty 1

## 2018-06-30 MED ORDER — FENTANYL CITRATE (PF) 100 MCG/2ML IJ SOLN
INTRAMUSCULAR | Status: AC
  Filled 2018-06-30: qty 2

## 2018-06-30 MED ORDER — INSULIN ASPART 100 UNIT/ML ~~LOC~~ SOLN: 1.0000 [IU] | SUBCUTANEOUS | Status: DC

## 2018-06-30 MED ORDER — GLYCOPYRROLATE 0.2 MG/ML IJ SOLN
0.2000 mg | INTRAMUSCULAR | Status: DC | PRN
  Administered 2018-06-30 – 2018-07-01 (×2): 0.2 mg via INTRAVENOUS
  Filled 2018-06-30 (×2): qty 1

## 2018-06-30 MED ORDER — PANTOPRAZOLE SODIUM 40 MG IV SOLR
40.0000 mg | Freq: Two times a day (BID) | INTRAVENOUS | Status: DC
  Administered 2018-06-30 – 2018-07-01 (×3): 40 mg via INTRAVENOUS
  Filled 2018-06-30 (×3): qty 40

## 2018-06-30 NOTE — Consult Note (Signed)
NAME:  Melody Gray, MRN:  [id number], DOB:  [birth year], LOS: 3 ADMISSION DATE:  06/13/2018, CONSULTATION DATE:  06/30/2018 REFERRING MD:  Dr Marval Regal and IMTS, CHIEF COMPLAINT:  Cardiorenal syndrome for CVVH3   Brief History   See belw  History of present illness   Melody Gray 48 y.o. female is morbidly obese African-American female with heart failure preserved ejection fraction with RV failure chronic tracheostomy dependent since 2015 based on chart review.  She has a history of sarcoidosis not otherwise specifie and also obesity hypoventilation syndrome.  At baseline she is on chronic trach collar with capping in the morning according to family but chart review also indicates she is on 2 L oxygen at home..    Based on chart review she had an admission in 2016 April for acute on chronic diastolic heart failure and right heart failure secondary to pulmonary hypertension and acute kidney injury and acute pulmonary edema associated with urinary tract infection.  After that no further admissions till April 30, 2018 when she was admitted for 19 days with acute on chronic hypoxemic respiratory failure left lower lobe pneumonia, cardiac arrest, is.  Gastric ulcer with hemorrhage, esophageal candidiasis, MRSA bacteremia and a new diagnosis of left renal mass 3 cm.  After the discharge in mid January 2020 she was readmitted June 12, 2018 for 8 days for acute on chronic kidney disease and chronic hypoxic respiratory failure.  Her discharge creatinine was around 3  She has now been readmitted this admission June 27, 2018 with 4-day history of dizziness, bilateral decreased hearing [?  She was on Lasix at home].  Increased oxygen requirement 4 L and in the ED required 6 L associated with ataxia.  Her creatinine now had jumped to 7 mg percent chest x-ray with some worsening effusion and atelectasis.  In the 3 days and submission her creatinine has worsened with renal failure now  creatinine almost 10 mg percent and mild hyperkalemia.  Also desaturating.  Goals of care conversation has been held by palliative care and renal.  This is documented in the chart and the son back-and-forth.  It appears at this point that plan is for a therapeutic trial of CVVH for 3-5 days.  In discussion with Dr. Bradd Burner the lateral renal it is of his opinion that given RV failure even though she is normotensive that she would not be able to handle hemodialysis.  On June 30, 2018 she has been completely anuric and in the afternoon she has been less responsive and difficult to arouse.  The past few years has been requested with transfer to critical care service unit.  Her sister works in [employer].  Past Medical History     has a past medical history of Abnormal LFTs (liver function tests), Acute kidney injury (AKI) with acute tubular necrosis (ATN) ([place]), Acute on chronic respiratory failure with hypoxemia ([place]) (05/05/2014), Acute renal failure superimposed on stage 4 chronic kidney disease ([place]), Acute respiratory failure (HCC), Acute respiratory failure with hypoxia ([place]), AKI (acute kidney injury) ([place]) (05/10/2011), Anemia, Aspiration pneumonia ([place]) (11/22/2013), Cardiac arrest ([place]) (05/03/2018), Cardiomyopathy, nonischemic (HCC), Chronic diastolic CHF (congestive heart failure) ([place]), Chronic respiratory failure ([place]), CKD (chronic kidney disease), stage II, Complication of anesthesia, Diabetes mellitus, Diabetic retinopathy, Difficult airway, Dyspnea, Essential hypertension, Health maintenance examination, Helicobacter pylori ab+ (05/2011), cardiac cath (2/08), Hypoxemia, Long term current use of systemic steroids, Morbid obesity ([place]), MRSA bacteremia (05/06/2018), Obesity hypoventilation syndrome ([place]), Pulmonary hypertension ([place]), Pulmonary hypertension  associated with sarcoidosis ([place]) (10/05/2010), QT prolongation, Sarcoidosis, Seborrheic dermatitis of scalp, Sleep apnea, and Tobacco  abuse.   reports that she has quit smoking. Her smoking use included cigarettes. She started smoking about 6 weeks ago. She has a 6.60 pack-year smoking history. She has never used smokeless tobacco.  Past Surgical History:  Procedure Laterality Date  . BIOPSY  05/13/2018   Procedure: BIOPSY;  Surgeon: [name], MD;  Location: [facility];  Service: Gastroenterology;;  . BREAST SURGERY    . CESAREAN SECTION    . ESOPHAGOGASTRODUODENOSCOPY (EGD) WITH PROPOFOL N/A 05/13/2018   Procedure: ESOPHAGOGASTRODUODENOSCOPY (EGD) WITH PROPOFOL;  Surgeon: [name], MD;  Location: [facility];  Service: Gastroenterology;  Laterality: N/A;  . FASCIOTOMY Left 05/03/2018   Procedure: FOUR COMPARTMENT FASCIOTOMY, LEFT LOWER LEG;  Surgeon: [name], MD;  Location: [facility];  Service: Vascular;  Laterality: Left;  . FASCIOTOMY CLOSURE Left 05/06/2018   Procedure: FASCIOTOMY CLOSURE OF LEFT LEG;  Surgeon: Marty Heck, MD;  Location: [facility];  Service: Vascular;  Laterality: Left;  . RIGHT HEART CATHETERIZATION N/A 08/12/2014   Procedure: RIGHT HEART CATH;  Surgeon: Jolaine Artist, MD;  Location: [facility] C[facility] LAB;  Service: Cardiovascular;  Laterality: N/A;  . TRACHEOSTOMY TUBE PLACEMENT N/A 08/10/2013   Procedure: TRACHEOSTOMY;  Surgeon: Melissa Montane, MD;  Location: [facility];  Service: ENT;  Laterality: N/A;  . TUBAL LIGATION      Allergies  Allergen Reactions  . Vicodin [Hydrocodone-Acetaminophen] Itching    Immunization History  Administered Date(s) Administered  . H1N1 04/08/2008  . Influenza Split 02/13/2011, 01/19/2012  . Influenza Whole 04/08/2008, 03/01/2009  . Influenza,inj,Quad PF,6+ Mos 05/20/2013, 02/16/2014, 01/14/2016, 01/17/2018  . Pneumococcal Conjugate-13 02/19/2014  . Pneumococcal Polysaccharide-23 03/01/2009, 05/20/2013  . Tdap 02/13/2011    Family History  Problem Relation Age of Onset  . Cancer Mother        colon cancer  . Multiple sclerosis Father    . Diabetes Father   . Asthma Sister        in childhood  . Hypertension Other      Current Facility-Administered Medications:  .  acetaminophen (TYLENOL) tablet 650 mg, 650 mg, Oral, Q6H PRN **OR** acetaminophen (TYLENOL) suppository 650 mg, 650 mg, Rectal, Q6H PRN, Winfrey, William B, MD .  atorvastatin (LIPITOR) tablet 40 mg, 40 mg, Oral, Q2000, Winfrey, William B, MD, 40 mg at 06/29/18 2240 .  heparin injection 5,000 Units, 5,000 Units, Subcutaneous, Q8H, Katherine Roan, MD, 5,000 Units at 06/30/18 1425 .  insulin aspart (novoLOG) injection 0-5 Units, 0-5 Units, Subcutaneous, QHS, Katherine Roan, MD, 1 Units at 06/28/18 2121 .  insulin aspart (novoLOG) injection 0-9 Units, 0-9 Units, Subcutaneous, TID WC, Katherine Roan, MD, 1 Units at 06/30/18 1425 .  Melatonin TABS 3 mg, 3 mg, Oral, QHS PRN, Oda Kilts, MD, 3 mg at 06/08/2018 2223 .  pantoprazole (PROTONIX) EC tablet 40 mg, 40 mg, Oral, BID, Katherine Roan, MD, 40 mg at 06/30/18 0902 .  simethicone (MYLICON) chewable tablet 80 mg, 80 mg, Oral, QID PRN, Shan Levans, William B, MD .  sodium polystyrene (KAYEXALATE) 15 GM/60ML suspension 30 g, 30 g, Oral, Once, [name], MD .  sodium zirconium cyclosilicate [facility]) packet 10 g, 10 g, Oral, Daily, Donato Heinz, MD, 10 g at 06/30/18 [facility]   06/23/2018 0 admit 2/28 - renal consult 06/30/2018 - pccm consult   Consults:  2/28 - renal  consult 06/30/2018 - pccm consult  Procedures:  Chronic track 2015  Significant Diagnostic Tests:  x  Micro Data:  x  Antimicrobials:   Oral diflucan 2/29 - 2/229  Interim history/subjective:  x  Objective   Blood pressure 113/75, pulse 80, temperature 98.2 F (36.8 C), resp. rate 17, height 5\' 1"  (1.549 m), weight 95.7 kg, SpO2 93 %.    FiO2 (%):  [50 %] 50 %   Intake/Output Summary (Last 24 hours) at 06/30/2018 1629 Last data filed at 06/30/2018 1039 Gross per 24 hour  Intake 240  ml  Output 100 ml  Net 140 ml   Filed Weights   06/19/2018 2049  Weight: 95.7 kg    Examination: General: Morbidly obese female in bed 2W06.  lying in bed HENT: Chronic tracheostomy that is capped with trach collar.  Short obese neck Lungs: Clear to auscultation distant breath sounds Cardiovascular: Regular rate and rhythm Abdomen: Obese Extremities: Chronic venous stasis edema Neuro: RASS-2/-3 equivalent.  She is able to open her eyes on loud voice stimulation and point to a third line upon request but not able to communicate or converse beyond that GU: Not examined   LABS    PULMONARY No results for input(s): PHART, PCO2ART, PO2ART, HCO3, TCO2, O2SAT in the last 168 hours.  Invalid input(s): PCO2, PO2  CBC Recent Labs  Lab 06/28/18 0231 06/29/18 0225 06/30/18 0208  HGB 8.2* 7.9* 7.8*  HCT 29.9* 29.7* 27.7*  WBC 11.5* 10.3 9.2  PLT 260 259 241    COAGULATION Recent Labs  Lab 06/15/2018 1320  INR 1.0    CARDIAC  No results for input(s): TROPONINI in the last 168 hours. No results for input(s): PROBNP in the last 168 hours.   CHEMISTRY Recent Labs  Lab 06/12/2018 1320 06/26/2018 1343 06/28/18 0231 06/29/18 0225 06/29/18 2237 06/30/18 0208  NA 140  --  138 140 138 138  K 4.3  --  5.1 5.5* 5.3* 5.5*  CL 103  --  101 101 103 104  CO2 25  --  28 25 23  20*  GLUCOSE 85  --  148* 93 172* 106*  BUN 52*  --  56* 67* 81* 82*  CREATININE 7.05* 7.00* 7.77* 9.14* 9.49* 9.78*  CALCIUM 7.6*  --  7.4* 7.3* 7.2* 7.4*  PHOS  --   --   --  10.2* 10.0* 10.0*   Estimated Creatinine Clearance: 7.4 mL/min (A) (by C-G formula based on SCr of 9.78 mg/dL (H)).   LIVER Recent Labs  Lab 06/05/2018 1320 06/28/18 0231 06/29/18 0225 06/29/18 2237 06/30/18 0208  AST 28 23  --   --   --   ALT 38 33  --   --   --   ALKPHOS 174* 164*  --   --   --   BILITOT 0.5 0.9  --   --   --   PROT 7.2 6.6  --   --   --   ALBUMIN 2.8* 2.7* 2.7* 2.7* 2.7*  INR 1.0  --   --   --   --       INFECTIOUS No results for input(s): LATICACIDVEN, PROCALCITON in the last 168 hours.   ENDOCRINE CBG (last 3)  Recent Labs    06/30/18 0833 06/30/18 1312 06/30/18 1643  GLUCAP 79 128* 100*         IMAGING x48h  - image(s) personally visualized  -   highlighted in bold No results found.    Resolved  Hospital Problem list   x  Assessment & Plan:  ASSESSMENT / PLAN:  PULMONARY A:  Chronic respiratory failure status post tracheostomy 2015.  Details not known.  06/30/2018 -> seems to be having an acute on chronic element at this point  P:   Continue trach collar with pulse ox monitoring Might have to remove the cath Might need pressure support ventilator support depending on mental status Check ABG  CARDIAC A:  history of cardiac arrest December 2019/January 2020 related to GI bleed Echo jan 2020 with good ef but RV dilattion - per CT -  pulm htn +  P: Repeat echo Check trop  VASCULAR A:   Currently normotensive  P:  MAP goal > 65 Can be tough access for HD cath  RENAL A:  Cardio-renal syndrome - worsening renal failure this admit  P:  CRRT per renal  - needs cath  ELECTROLYTES A:  Hyperkalemia. At risk for acidosis P: Lokelma Needs CVVH   GASTROINTESTINAL A:   Hx of GU hemorrhage dec 2019/jan 2020 admit Was po eating before obtundation  P:   NPO Panda PPI bid  HEMATOLOGIC A:  GU hemorrhage dec 2019/jan 2020 This admit: Now anemia of kidney/chronic disease and critical illness   P:  - PRBC for hgb </= 6.9gm%    - exceptions are   -  if ACS susepcted/confirmed then transfuse for hgb </= 8.0gm%,  or    -  active bleeding with hemodynamic instability, then transfuse regardless of hemoglobin value   At at all times try to transfuse 1 unit prbc as possible with exception of active hemorrhage    INFECTIOUS A:   No evidence of infection P:   monitor   ENDOCRINE A:   At risk hyper/hypoglycemia   P:    ssi  NEUROLOGIC A:   Drowsy 06/30/2018 PM P:   ABG CVVH  GLOBAL Multiple organ failure   Best practice:  Diet: npo Pain/Anxiety/Delirium protocol (if indicated): avoid pain VAP protocol (if indicated): HOB > 30 DVT prophylaxis: heparin 5000 units sq GI prophylaxis: ppi iv bid Glucose control: hyperglycemia protocol Mobility: bed rest Code Status: DNR but full medical care including do CVVH - per pallaitive care 06/30/2018 children have assigned decision making to [place] the sister Family Communication: Sister Acupuncturist updated) > Also d/w IMTS, renal and pall care Disposition:  ICU   ATTESTATION & SIGNATURE   The patient Melody Gray is critically ill with multiple organ systems failure and requires high complexity decision making for assessment and support, frequent evaluation and titration of therapies, application of advanced monitoring technologies and extensive interpretation of multiple databases.   Critical Care Time devoted to patient care services described in this note is  60  Minutes. This time reflects time of care of this signee Dr Brand Males. This critical care time does not reflect procedure time, or teaching time or supervisory time of PA/NP/Med student/Med Resident etc but could involve care discussion time     Dr. Brand Males, M.D., [facility].C.P Pulmonary and Critical Care Medicine Staff Physician [facility] Pager: [phone], If no answer or between  15:00h - 7:00h: call [phone]  06/30/2018 4:31 PM

## 2018-06-30 NOTE — Progress Notes (Signed)
Patient ID: Melody Gray, female   DOB: Feb 18, 1971, 48 y.o.   MRN: [id number] S: Discussions with family and palliative care noted.  Melody Gray is uncertain what to do and Melody family is at a loss as well.  Despite going over with she and Melody Gray the multiple end stage disease processes and the unlikelihood that she would tolerate IHD, they would like to attempt short term CVVHD to see if this will improve Melody mental status as well as renal function, knowing that it will not fix any of Melody underlying issues and may hasten Melody demise with hypotension. O:BP 113/75 (BP Location: Left Wrist)   Pulse 85   Temp 98.2 F (36.8 C)   Resp (!) 23   Ht 5\' 1"  (1.549 m)   Wt 95.7 kg   SpO2 (!) 89%   BMI 39.86 kg/m   Intake/Output Summary (Last 24 hours) at 06/30/2018 1338 Last data filed at 06/30/2018 1039 Gross per 24 hour  Intake 240 ml  Output 100 ml  Net 140 ml   Intake/Output: No intake/output data recorded.  Intake/Output this shift:  Total I/O In: 240 [P.O.:240] Out: 100 [Urine:100] Weight change:  PYP:[id number] ill-appearing AAF with trach, somnolent but arousable CVS:no rub Resp: scattered rhonchi and transmitted upper airway sounds Abd: obese, +BS, soft Ext: 1+ edema on right, 2+ on left  Recent Labs  Lab 06/24/18 1628 06/05/2018 1320 06/26/2018 1343 06/28/18 0231 06/29/18 0225 06/29/18 2237 06/30/18 0208  NA 144 140  --  138 140 138 138  K 3.9 4.3  --  5.1 5.5* 5.3* 5.5*  CL 102 103  --  101 101 103 104  CO2 26 25  --  28 25 23  20*  GLUCOSE 46* 85  --  148* 93 172* 106*  BUN 27* 52*  --  56* 67* 81* 82*  CREATININE 3.23* 7.05* 7.00* 7.77* 9.14* 9.49* 9.78*  ALBUMIN  --  2.8*  --  2.7* 2.7* 2.7* 2.7*  CALCIUM 8.2* 7.6*  --  7.4* 7.3* 7.2* 7.4*  PHOS  --   --   --   --  10.2* 10.0* 10.0*  AST  --  28  --  23  --   --   --   ALT  --  38  --  33  --   --   --    Liver Function Tests: Recent Labs  Lab 06/23/2018 1320 06/28/18 0231 06/29/18 0225 06/29/18 2237  06/30/18 0208  AST 28 23  --   --   --   ALT 38 33  --   --   --   ALKPHOS 174* 164*  --   --   --   BILITOT 0.5 0.9  --   --   --   PROT 7.2 6.6  --   --   --   ALBUMIN 2.8* 2.7* 2.7* 2.7* 2.7*   No results for input(s): LIPASE, AMYLASE in the last 168 hours. No results for input(s): AMMONIA in the last 168 hours. CBC: Recent Labs  Lab 06/24/18 1628  06/21/2018 1320 06/28/18 0231 06/29/18 0225 06/30/18 0208  WBC 9.3   < > 10.0 11.5* 10.3 9.2  NEUTROABS  --   --  7.4  --   --   --   HGB 9.3*   < > 9.0* 8.2* 7.9* 7.8*  HCT 30.2*   < > 32.0* 29.9* 29.7* 27.7*  MCV 92  --  100.3* 101.4* 101.7* 100.7*  PLT  201   < > 236 260 259 241   < > = values in this interval not displayed.   Cardiac Enzymes: Recent Labs  Lab 06/28/18 1711  CKTOTAL 65   CBG: Recent Labs  Lab 06/29/18 1140 06/29/18 1642 06/29/18 2054 06/30/18 0833 06/30/18 1312  GLUCAP 119* 209* 191* 79 128*    Iron Studies:  Recent Labs    06/28/18 1711  IRON 40  TIBC 365  FERRITIN 56   Studies/Results: No results found. Marland Kitchen atorvastatin  40 mg Oral Q2000  . heparin  5,000 Units Subcutaneous Q8H  . insulin aspart  0-5 Units Subcutaneous QHS  . insulin aspart  0-9 Units Subcutaneous TID WC  . pantoprazole  40 mg Oral BID  . sodium polystyrene  30 g Oral Once  . sodium zirconium cyclosilicate  10 g Oral Daily    BMET    Component Value Date/Time   NA 138 06/30/2018 0208   NA 144 06/24/2018 1628   K 5.5 (H) 06/30/2018 0208   CL 104 06/30/2018 0208   CO2 20 (L) 06/30/2018 0208   GLUCOSE 106 (H) 06/30/2018 0208   BUN 82 (H) 06/30/2018 0208   BUN 27 (H) 06/24/2018 1628   CREATININE 9.78 (H) 06/30/2018 0208   CREATININE 1.90 (H) 01/06/2016 1317   CALCIUM 7.4 (L) 06/30/2018 0208   CALCIUM 9.5 07/28/2010 1133   GFRNONAA 4 (L) 06/30/2018 0208   GFRNONAA 55 (L) 09/25/2014 1350   GFRAA 5 (L) 06/30/2018 0208   GFRAA 63 09/25/2014 1350   CBC    Component Value Date/Time   WBC 9.2 06/30/2018 0208    RBC 2.75 (L) 06/30/2018 0208   HGB 7.8 (L) 06/30/2018 0208   HGB 9.3 (L) 06/24/2018 1628   HCT 27.7 (L) 06/30/2018 0208   HCT 30.2 (L) 06/24/2018 1628   PLT 241 06/30/2018 0208   PLT 201 06/24/2018 1628   MCV 100.7 (H) 06/30/2018 0208   MCV 92 06/24/2018 1628   MCH 28.4 06/30/2018 0208   MCHC 28.2 (L) 06/30/2018 0208   RDW 17.1 (H) 06/30/2018 0208   RDW 14.6 06/24/2018 1628   LYMPHSABS 1.7 06/16/2018 1320   LYMPHSABS 2.0 05/22/2018 1330   MONOABS 0.6 06/23/2018 1320   EOSABS 0.1 06/08/2018 1320   EOSABS 0.3 05/22/2018 1330   BASOSABS 0.0 06/15/2018 1320   BASOSABS 0.0 05/22/2018 1330     Assessment/Plan: 1. AKI/CKD stage 4- profound increase in Cr in short period of time. No inciting etiology but appears to be in decompensated CHF, so cardiorenal syndrome a high likelihood. Also on DDx is ischemic ATN due to hypotension with concomitant ACE inhibition. She is a very poor candidate for dialysis given Melody morbid obesity, severe pulmonary HTN, chronic hypoxic respiratory failure, CMP, and tracheostomy among Melody many end-stage, irreversible disease processes. Would recommend palliative care consult to help set goals/limits of care. 1. Ordered urine studies, however none have been collected as she is anuric 2. Bladder scan with only 84ml and unable to place foley catheter. 3. Renal US without obstruction  4. Worsening renal function since Melody discharge on 06/20/18 and did not respond to high dose lasix or IVFs.   5. Discussed case with house staff.  Very difficult situation.  She is a poor candidate for longterm dialysis given Melody multiple end-stage disease processes.  Initially plan was to transition to comfort care, however she and the family could not decide on a plan but she was not ready to go home with  hospice.  6. Will plan short term trial of CVVHD to see if she can even tolerate it.  Hopefully Melody mentation will improve and she can participate with the discussions.  I did tell  Melody Gray that outpatient dialysis is not likely to be possible given Melody trach, CHF, cirrhosis, and poor functional status.  It may be that she would require LTC placement and likely out of state.   7. Plan to transfer to ICU for catheter placement and initiate CVVHD (short term, no more than 5 days) to see how she responds.  She has a high likelihood of worsening even with CVVHD. 8. I discussed comfort care with the patient and she simply stated "I don't know". 2. CHF- + edema on left leg and CXR with pleural effusions and bibasilar infiltrates. Given one dose of IV lasix last night without response.  Plan as outlined above 3. Hyperkalemia- as above, started lokelma and will plan on CVVHD 4. Hypotension- possibly related to CHF. She is too unstable for intermittent HD and will need to be transferred to ICU. 5. Anemia of CKD stage 4- will check iron stores and start ESA. Transfuse prn  6. Chronic hypoxic respiratory failure- worsening respiratory status with CHF.  Consult PCCM as above.  Also check abg to r/o hypercarbia. 7. Dizziness- possibly related to mastoid infection 8. Disposition - poor overall prognosis as highlighted above.  Doubt she could tolerate intermittent HD and will only do CVVHD short term so she can improve Melody mentation to participate in GOC/EOL meeting.  Dialysis will not correct Melody underlying issues and will only lessen Melody QOL.  Donetta Potts, MD [employer] [phone]

## 2018-06-30 NOTE — Progress Notes (Signed)
  Went back to see patient to see how she was doing.  During prior visit and family meeting, her sister, Justice Rocher, as well as myself had concerns whether patient was already becoming uremic and unable to speak for herself.  Her statements today  regarding healthcare decisions, have been inconsistent,   Patient has since become more somnolent as the day has gone on.  She will answer a few questions when asked but otherwise is sleeping.  Her sisters, Justice Rocher and Colletta [place] are at the bedside.  Adopted daughter, Junie Panning also joins [name] at the bedside.  Patient stated on 06/29/2018 that she would like her sister, Justice Rocher, to be her healthcare power of attorney were she unable to speak for herself.  At that time I placed a spiritual care consult to arrange this on 07/01/2018 but unfortunately patient has declined and now is unable to speak for herself  Justice Rocher has a good grasp of what her sister is dealing with and states she does not want to see her go through ICU, CRRT knowing that it will not help her to recover.  Justice Rocher understands that her sister is not a candidate for community dialysis.  Justice Rocher also shares that her sister has verbalized how tired she has become and that she would not want to go through another cardiac arrest/code and aggressive measures.  I did call patient's daughter, Roosvelt Harps , and her son Dorothea Ogle.  I spoke to both and they both verbalized that they were comfortable with their aunt Justice Rocher making healthcare decisions.  Given that, Justice Rocher shares that DNR would be in her sister's best interest and I supported that decision given the severity of her multisystem failure/disease, and inability to pursue community dialysis  Decision was made to switch to full comfort care, not pursue CRRT in ICU I will make patient DNR.  Family would also like patient transferred to [address] where it would be easier to spend time with her as she transitions towards end-of-life.  Family informed that prognosis could be a matter  of hours to days  Staffed with Dr. Chase Caller, PCCM as well as supervising physician Dr. Micheline Rough was present for my conversations with patient's sister.  Thank you, Romona Curls, NP

## 2018-06-30 NOTE — Progress Notes (Signed)
  Date: 06/30/2018  Patient name: Melody Gray  Medical record number: [id number]  Date of birth: 1970-11-21   This patient's plan of care was discussed with the house staff. Please see Dr. Nelma Rothman note for complete details. I concur with his findings and plan.  Family meeting today to discuss [place].  Options would include aggressive care including ICU admission and CRRT (if able to tolerate line placement etc) or a more palliative, symptom management approach.  Palliative care team assisting [name] today with a family meeting.    Sid Falcon, MD 06/30/2018, 11:45 AM

## 2018-06-30 NOTE — Progress Notes (Signed)
Subjective: The patient was lying in her bed today upon entering the room.  She was able to express to me her wishes to undergo dialysis if her medical team felt this was indicated.  She wishes to continue to proceed as aggressively as possible.  She denies worsening of her breathing, denies chest pain, abdominal pain, or lower extremity pain.  Objective:  Vital signs in last 24 hours: Vitals:   06/29/18 1956 06/29/18 2337 06/30/18 0332 06/30/18 0400  BP: (!) 125/55 109/61 102/60   Pulse: 90 65 87 86  Resp: (!) 22 (!) 21 20 (!) 22  Temp:  98.8 F (37.1 C) 99.6 F (37.6 C)   TempSrc:  Oral Oral   SpO2: 94% 95% 100% 100%  Weight:      Height:       General: A/O x4, on a trach collar no acute distress, afebrile, nondiaphoretic Cardio: RRR, no mrg's Pulmonary: Bilateral wheezing and rhonchi MSK: BLE nontender, nonedematous Neuro: Alert to person location as well as reason for hospitalization, unable to converse freely due to trach placement  Psych: attempts to engage appropriately  Assessment/Plan:  Principal Problem:   AKI (acute kidney injury) ([place]) Active Problems:   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 Neira is a 48 year old female with a past medical history of HFpEF, trach dependent OSH, HTN, CKD 4, T2DM, and chronic respiratory failure on 2 L O2  admitted for acute on chronic renal failure.  At admission she presented with additional findings of hyperkalemia in conjunction with the worsening renal function.  Patient continues to have poor urinary output despite  high-dose Lasix trial.  Fluid status is difficult to assess given her lymphedema of the left lower extremity as well as her lack of response to either fluids and diuretics.  However, given the negative response to diuretics we treated with gentle fluid rehydration.  Due to the patient's multiple comorbidities she is not an ideal candidate for hemodialysis long-term but wished to undergo CRRT if appropriate.  Acute renal injury on CKD: Creatinine continues to trend up to 9.78 today, 9.49 previously. potassium stable at 5.5 despite p.o. potassium binder administration and Lasix.  BUN continues to increase daily. She is at a critical junction in her treatment at this time given the continued worsening of her urinary function and development of uremic-like symptoms with mental slowing which had improved today.  I am in agreement with nephrology that it is unlikley she would do well with long term hemodialysis given her comorbidities.  We will reassess after the family's meeting today to determine if the patient is agreeable to CRRT in the ICU or would prefer a more palliative approach. We will discuss possibility of CRRT with nephrology Daily renal panel trend continued Continue to monitor electrolytes Continue PO potassium binders  Dizziness and bilateral hearing loss: Unclear etiology.  MRI did not demonstrate acute infarct of visible size.  Will consult ENT Encephalopathy resolved.  Chronic diastolic heart failure: Blood pressure remains stable but tentative. Continue strict I's and O's, daily weights, holding antihypertensives  Type 2 diabetes  mellitus: Patient remains hypoglycemic despite discontinuation of long-acting insulin. 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 Continue O2 sats rest 94% 2 Encouraged trach O2 use daily  DVT P PX: Subcu heparin Diet:  Cardiac, diabetic diet Bowel regimen: Senokot Code: Full Dispo: Anticipated discharge in approximately ? day(s).   Kathi Ludwig, MD 06/30/2018, 6:38 AM Pager: Pager# [phone]

## 2018-06-30 NOTE — Progress Notes (Signed)
Update from pall care  - they pall care spoke to sister Justice Rocher and patient now comfort care  - ccm will cancel tx for ICU and sign off     SIGNATURE    Dr. Brand Males, M.D., F.C.C.P,  Pulmonary and Critical Care Medicine Staff Physician, [place] Director - Interstitial Lung Disease  Program  Pulmonary [place] at [place], [place], [address]  Pager: [phone], If no answer or between  15:00h - 7:00h: call [phone] Telephone: [phone]  5:32 PM 06/30/2018

## 2018-06-30 NOTE — Progress Notes (Signed)
Patient complain of cramps and feeling urge to urinate despite placement of foley cath. Attempts made to reposition patient and foley without success. Foley removed, reinsertion attempted x2 with Clarise Cruz, NP at bedside, unsuccessful. Patient c/o pain, cramping during procedure. Patient assisted to [facility], unable to urinate.   Verbal order from [facility], ok not to attempt re-insertion at this time. Primary MD team has ordered scheduled bladder scans to monitor and intervene when necessary.

## 2018-06-30 NOTE — Progress Notes (Signed)
Patient sleeping after conversation with Dr. Marval Regal. Daughter and sister at bedside. They relate to this RN that patient said yesterday she wished to be DNR but still have HD. They also state their wishes for patient to be DNR. This RN educated that patient must make decision. Patient did not wake during sq med administration. Dr. Marval Regal on unit and made verbally aware. He is attempting transfer to ICU for patient today.

## 2018-06-30 NOTE — Progress Notes (Addendum)
   Patient seen, chart reviewed.  Patient's sister, Melody Gray as well as daughter Melody Gray at the bedside.  Patient has another son and daughter who were not present for today's meeting.  Patient appears more somnolent this afternoon.  She is less conversant and rapidly falls back asleep when not actively engaged in answering questions.  She is complaining of the need to urinate.  She has a Foley catheter in with minimal output noted.  Per bedside RN, Anderson Malta, bladder scan performed this morning only showed 92 cc in the bladder.  We did reattempt twice to reinsert a Foley to be sure that Foley was in correct position and patent but were unable to advance catheter, ? Spasms  She did reiterate that she would be willing to try dialysis in ICU and her sister and daughter seem to support this.  I discussed at length how dialysis in the community would likely not be an option because of her severe underlying cardiopulmonary disease.  She would be at high risk for another arrest  Attempted CODE STATUS discussion with family members and patient.  Unfortunately, as noted above, patient was not engaged and began to fall back asleep.  I did encourage them to consider DNR as well as reiterating DNR just not mean do not treat  Per chart review, I see that attendings note she indicated to him that she wanted to be as aggressive as possible.  I get the sense that family will support what ever patient decides.  She looks at her sister and her daughter and states "I just do not want to let you down".  We attempted to tell her this is about disease progression, not that she is giving up and not a fighter, if she elects DNR  Plan  Palliative medicine to stay involved and continue goals of care.  This is a very ill woman, very fragile and high risk for another cardiac event.  I am concerned about her going through another code given the severity of her underlying respiratory disease and now with worsening renal failure.  I did  share this with her family  Hard Choices for [employer] booklet given to family members to review  Patient would pursue CRRT in ICU to see if this could improve her mental status  Need urology to reinsert Foley catheter. Recommend starting anti-spasmotic medication such as ditropan, detrol  Paged attending to provide update  Thank you, Romona Curls, NP Time spent 40 minutes Greater than 50% of time spent in counseling and coordination of care

## 2018-06-30 DEATH — deceased

## 2018-07-01 DIAGNOSIS — I5033 Acute on chronic diastolic (congestive) heart failure: Secondary | ICD-10-CM

## 2018-07-01 DIAGNOSIS — Z66 Do not resuscitate: Secondary | ICD-10-CM

## 2018-07-01 DIAGNOSIS — N185 Chronic kidney disease, stage 5: Secondary | ICD-10-CM

## 2018-07-01 DIAGNOSIS — J9621 Acute and chronic respiratory failure with hypoxia: Secondary | ICD-10-CM

## 2018-07-01 DIAGNOSIS — Z8674 Personal history of sudden cardiac arrest: Secondary | ICD-10-CM

## 2018-07-01 DIAGNOSIS — I132 Hypertensive heart and chronic kidney disease with heart failure and with stage 5 chronic kidney disease, or end stage renal disease: Secondary | ICD-10-CM

## 2018-07-01 DIAGNOSIS — J9611 Chronic respiratory failure with hypoxia: Secondary | ICD-10-CM

## 2018-07-01 DIAGNOSIS — Z9889 Other specified postprocedural states: Secondary | ICD-10-CM

## 2018-07-01 LAB — FOLATE RBC
Folate, Hemolysate: 361 ng/mL
Folate, RBC: 1245 ng/mL (ref >498)
Hematocrit: 29 % — ABNORMAL LOW (ref 34.0–46.6)

## 2018-07-01 LAB — IMMUNOFIXATION ELECTROPHORESIS
IgA: 547 mg/dL — ABNORMAL HIGH (ref 87–352)
IgG (Immunoglobin G), Serum: 1840 mg/dL — ABNORMAL HIGH (ref 700–1600)
IgM (Immunoglobulin M), Srm: 76 mg/dL (ref 26–217)
Total Protein ELP: 7.2 g/dL (ref 6.0–8.5)

## 2018-07-01 LAB — KAPPA/LAMBDA LIGHT CHAINS
Kappa free light chain: 311.7 mg/L — ABNORMAL HIGH (ref 3.3–19.4)
Kappa, lambda light chain ratio: 1.69 — ABNORMAL HIGH (ref 0.26–1.65)
Lambda free light chains: 184.7 mg/L — ABNORMAL HIGH (ref 5.7–26.3)

## 2018-07-01 LAB — IMMUNOFIXATION, URINE

## 2018-07-01 MED ORDER — ACETYLCYSTEINE 20 % IN SOLN
4.0000 mL | Freq: Four times a day (QID) | RESPIRATORY_TRACT | Status: DC
  Administered 2018-07-01 – 2018-07-02 (×3): 4 mL via RESPIRATORY_TRACT
  Filled 2018-07-01 (×4): qty 4

## 2018-07-01 MED ORDER — HYDROMORPHONE HCL 1 MG/ML IJ SOLN: 0.5000 mg | INTRAMUSCULAR | Status: DC | PRN

## 2018-07-01 MED ORDER — ALBUTEROL SULFATE (2.5 MG/3ML) 0.083% IN NEBU
2.5000 mg | INHALATION_SOLUTION | Freq: Four times a day (QID) | RESPIRATORY_TRACT | Status: DC
  Administered 2018-07-01 – 2018-07-02 (×3): 2.5 mg via RESPIRATORY_TRACT
  Filled 2018-07-01 (×4): qty 3

## 2018-07-01 MED ORDER — ACETYLCYSTEINE 10 % IN SOLN
4.0000 mL | Freq: Four times a day (QID) | RESPIRATORY_TRACT | Status: DC
  Filled 2018-07-01 (×3): qty 4

## 2018-07-01 MED ORDER — ACETYLCYSTEINE 10 % IN SOLN
4.0000 mL | RESPIRATORY_TRACT | Status: DC
  Filled 2018-07-01: qty 4

## 2018-07-01 MED ORDER — LORAZEPAM 2 MG/ML IJ SOLN
0.2500 mg | Freq: Two times a day (BID) | INTRAMUSCULAR | Status: DC
  Administered 2018-07-01: 0.25 mg via INTRAVENOUS
  Filled 2018-07-01: qty 1

## 2018-07-01 MED ORDER — GLYCOPYRROLATE 0.2 MG/ML IJ SOLN
0.4000 mg | INTRAMUSCULAR | Status: DC | PRN
  Administered 2018-07-01 – 2018-07-02 (×4): 0.4 mg via INTRAVENOUS
  Filled 2018-07-01 (×4): qty 2

## 2018-07-01 MED ORDER — HYDROMORPHONE HCL 1 MG/ML IJ SOLN
1.0000 mg | INTRAMUSCULAR | Status: DC
  Administered 2018-07-01 – 2018-07-02 (×5): 1 mg via INTRAVENOUS
  Filled 2018-07-01 (×5): qty 1

## 2018-07-01 MED ORDER — ACETYLCYSTEINE 20 % IN SOLN
4.0000 mL | Freq: Four times a day (QID) | RESPIRATORY_TRACT | Status: DC
  Filled 2018-07-01 (×3): qty 4

## 2018-07-01 MED ORDER — SCOPOLAMINE 1 MG/3DAYS TD PT72
1.0000 | MEDICATED_PATCH | TRANSDERMAL | Status: DC
  Administered 2018-07-01: 1.5 mg via TRANSDERMAL
  Filled 2018-07-01: qty 1

## 2018-07-01 NOTE — Progress Notes (Signed)
Chaplain-ET passed spiritual care referral to unit chaplain-AD for follow up.

## 2018-07-01 NOTE — Progress Notes (Signed)
Medicine attending: I examined this patient today together with resident physician Dr. Modena Nunnery and I concur with her evaluation and management plan. We greatly appreciate input from all consultants over the weekend and especially the palliative care team. Patient and family ultimately decided to decline further aggressive care including attempts at peritoneal dialysis.  DO NOT RESUSCITATE status established. Husband and 1 of her sons at the bedside this morning.  Husband tearful. The patient is awake but chooses not to communicate verbally.  She is able to shake her head to indicate that she is not experiencing any pain. Impression:  48 year old woman with multiple progressive comorbid medical conditions summarized in the progress note by Dr. Koleen Distance, now with stage V renal insufficiency, chronic trach dependency, complicated by underlying diastolic cardiac dysfunction with recent prolonged resuscitation following a cardiac arrest, postarrest complications from a emergency tibial line with development of compartment syndrome requiring bilateral fasciotomies right lower extremity.  Type 2 insulin-dependent diabetes.  Hypertension.  Sarcoidosis.  Upper GI bleed dring January while recovering from cardiac resuscitation.  Readmitted on February 12 with acute on chronic diastolic heart failure and exacerbation of chronic hypoxic respiratory failure.  We reassured the family we would do everything in our power to keep her comfortable. We did not address transfer to a hospice facility today but this may be a possibility.  It would be a more intermittent setting for the patient and family none.

## 2018-07-01 NOTE — Care Management Important Message (Signed)
Important Message  Patient Details  Name: Melody Gray MRN: [id number] Date of Birth: 21-Jan-1971   Medicare Important Message Given:  Yes    Shanee Batch Montine Circle 07/01/2018, 4:39 PM

## 2018-07-01 NOTE — Progress Notes (Signed)
Daily Progress Note   Patient Name: Melody Gray       Date: 07/01/2018 DOB: 02-Apr-1971  Age: 48 y.o. MRN#: [id number] Attending Physician: Annia Belt, MD Primary Care Physician: Neva Seat, MD Admit Date: 06/25/2018  Reason for Consultation/Follow-up: Non pain symptom management, Pain control, Psychosocial/spiritual support and Terminal Care  Subjective: Patient is awake but does not respond to me.  Her breathing sounds wet.  Per family at bedside her trach was clogged earlier and they have suctioned her.  This seems to be a recurrent problem.  We discussed her comfort.  Family member (who works for [employer]) needs FMLA forms completed.   Assessment: Patient on trach collar at 10L with 80% FIO2.  She is awake and appears somewhat uncomfortable due to excess secretions blocking her trach.   Patient Profile/HPI:  48 y.o. female  with past medical history of chronic kidney disease stage IV, sarcoidosis (lung and liver; per patient, diagnosed by Dr. Melvyn Novas), obstructive sleep apnea, pulmonary hypertension, O2 dependency, diabetes type 2, diastolic heart failure grade 1, trach dependency, history of acute cardiac arrest on 05/03/2018, history of left calf fasciotomy/compartment syndrome, history of MRSA bacteremia admitted on 06/25/2018 with acute dizziness and hearing loss x4 days.  Patient has had multiple admissions in 2020 secondary to acute respiratory failure.  During  hospitalizationin January, she had a  cardiac arrest, subsequently developed compartment syndrome to left calf from venous access line placement requiring a fasciotomy.  She is now in with an acute elevation of her creatinine.  Creatinine on admission was 7.05.  As of 06/29/2018 it is now 9.14, potassium  5.5   Length of Stay: 4  Current Medications: Scheduled Meds:  . acetylcysteine  4 mL Nebulization Q4H  .  HYDROmorphone (DILAUDID) injection  1 mg Intravenous Q4H  . LORazepam  0.25 mg Intravenous BID  . pantoprazole (PROTONIX) IV  40 mg Intravenous Q12H  . scopolamine  1 patch Transdermal Q72H    Continuous Infusions:   PRN Meds: glycopyrrolate, HYDROmorphone (DILAUDID) injection, LORazepam  Physical Exam        Well developed obese female, lying in bed, eyes open on trach collar.  Does not appear comfortable. CV tachy resp sound coarse, increased work of breathing Abdomen obese, NT   Vital Signs: BP 108/64 (BP Location: Left Wrist)  Pulse 89   Temp 97.9 F (36.6 C) (Oral)   Resp 20   Ht 5\' 1"  (1.549 m)   Wt 95.7 kg   SpO2 92%   BMI 39.86 kg/m  SpO2: SpO2: 92 % O2 Device: O2 Device: Tracheostomy Collar O2 Flow Rate: O2 Flow Rate (L/min): 10 L/min  Intake/output summary: No intake or output data in the 24 hours ending 07/01/18 1133 LBM: Last BM Date: 05/29/18 Baseline Weight: Weight: 95.7 kg Most recent weight: Weight: 95.7 kg       Palliative Assessment/Data: 10%     Patient Active Problem List   Diagnosis Date Noted  . Palliative care by specialist   . 'light-for-dates' infant with signs of fetal malnutrition 06/17/2018  . Bloating 06/24/2018  . Subcutaneous nodule of abdominal wall 06/24/2018  . Shortness of breath 06/12/2018  . Left renal mass- 3.1cm  05/16/2018  . Esophageal candidiasis ([place]) 05/13/2018  . Gastric ulcer with hemorrhage   . Tobacco use disorder 05/12/2018  . History of upper gastrointestinal hemorrhage   . Acute blood loss anemia   . CKD (chronic kidney disease) stage 4, GFR 15-29 ml/min (HCC)   . Morbid obesity ([place])   . Pneumonia 05/06/2018  . Traumatic compartment syndrome of left lower extremity ([place])   . Chronic pulmonary hypertension ([place]) 04/30/2018  . Gallbladder polyp 05/07/2017  . Cirrhosis ([place]) 05/07/2017  .  Chronic respiratory failure with hypoxia (HCC)/ trach dep 04/06/2017  . Goals of care, counseling/discussion 12/09/2015  . Morbid obesity due to excess calories ([place]) complicated by OHS/ hbp 51/76/1607  . Tracheostomy dependent ([place])   . Cigarette smoker   . Obesity hypoventilation syndrome ([place])   . Cardiomyopathy, nonischemic ([place])   . Long term current use of systemic steroids 05/05/2014  . Health care maintenance 02/22/2014  . Sleep apnea 12/17/2013  . Acute on chronic respiratory failure with hypoxia ([place]) 11/22/2013  . Adjustment disorder with depressed mood 11/13/2013  . Tracheostomy status ([place]) 10/28/2013  . Hyperlipidemia with target LDL less than 100 09/15/2013  . Smoker 07/25/2013  . ASCUS (atypical squamous cells of undetermined significance) on Pap smear 09/03/2012  . Arterial epistaxis 11/13/2011  . AKI (acute kidney injury) ([place]) 05/10/2011  . Left hip pain 08/02/2010  . Anemia associated with chronic renal failure 01/19/2010  . 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 Chronic diastolic heart failure, grade 2 04/28/2009  . NEPHROTIC SYNDROME 04/12/2009  . Body mass index (BMI) of 40.1-44.9 in adult ([place]) 04/06/2009  . GERD (gastroesophageal reflux disease) 06/12/2008  . Sarcoidosis ([place]) 05/22/2008  . 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 HTN (hypertension), benign 31-Jul-202008  . History of prolonged Q-T interval on ECG 31-Jul-202008    Palliative Care Plan    Recommendations/Plan:  Discussed with RN and Palliative Director.   Will order mucomyst to thin secretions.    Scope patch  Increase dilaudid frequency and dose for comfort.  She will likely need a gtt.  When patient becomes comfortable we can begin to wean oxygen.  Palliative RN will complete FMLA forms for Nad[place].  Goals of Care and Additional  Recommendations:  Limitations on Scope of Treatment: Full Comfort Care  Code Status:  DNR  Prognosis:   Hours - Days   Discharge Planning:  Anticipated Hospital Death  Care plan was discussed with RN, family at bedside.  Thank you for allowing the Palliative Medicine  Team to assist in the care of this patient.  Total time spent:  35 min.     Greater than 50%  of this time was spent counseling and coordinating care related to the above assessment and plan.  Florentina Jenny, PA-C Palliative Medicine  Please contact Palliative MedicineTeam phone at [phone] for questions and concerns between 7 am - 7 pm.   Please see AMION for individual provider pager numbers.

## 2018-07-01 NOTE — Progress Notes (Signed)
   Subjective: No overnight events. This morning, Melody Gray is sleeping comfortably in bed. She shakes her head no when asked if she is in any pain. She otherwise does not interact with the medical team. Family is at bedside and reports that she seemed quiet and comfortable overnight. The family's questions were addressed.   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 Patient's renal function continued to worsen despite trials of IVF and high dose lasix. Due to multiple comorbidities, she is not a candidate for dialysis. Palliative care was consulted for [place]. After discussing the options of more aggressive approach for with CRRT in the ICU versus comfort care, patient and family have decided to transition to comfort care with anticipated in hospital death.   End of life care - greatly appreciate palliative  care following   - patient appears comfortable on exam this morning; will continue comfort measures   Dispo: Transitioned to comfort care for anticipated hospital death.   Delice Bison, DO 07/01/2018, 6:38 AM Pager: [phone]

## 2018-07-01 NOTE — Progress Notes (Signed)
Chart reviewed.  Patient is now hospice comfort care.  Will sign off.

## 2018-07-08 ENCOUNTER — Ambulatory Visit: Payer: Medicare Other

## 2018-07-11 ENCOUNTER — Ambulatory Visit (HOSPITAL_COMMUNITY): Payer: Medicare Other

## 2018-07-31 NOTE — Death Summary Note (Signed)
  Name: Melody Gray MRN: [id number] DOB: Apr 15, 1971 48 y.o.  Date of Admission: 06/10/2018 12:33 PM Date of Discharge: July 25, 2018 Attending Physician: Annia Belt, MD  Discharge Diagnosis: Principal Problem:   AKI (acute kidney injury) [facility]) Active Problems:   Diabetes mellitus type 2, insulin dependent ([place])   Body mass index (BMI) of 40.1-44.9 in adult ([place])   HTN (hypertension), benign   Chronic diastolic heart failure, grade 2   Acute on chronic respiratory failure with hypoxia (HCC)   Sleep apnea   Goals of care, counseling/discussion   Palliative care by specialist   Hypoxemic respiratory failure, chronic ([place])   Chronic kidney disease (CKD), stage V ([place])   Cause of death: Stage V Renal failure Time of death: 9:02 am  Disposition and follow-up:   [name] was discharged from [facility] in expired condition.    Hospital Course: [name] a 48 year old female with apast medical history ofHFpEF,trach dependent OSH, HTN, CKD 4, T2DM, and chronic respiratory failure on 2 L O2 [place] admitted for acute on chronic renal failure.Patient's renal function continued to worsen despite trials of IVF and high dose lasix. Due to multiple comorbidities, she is not a candidate for dialysis. Palliative care was consulted for [place]. After discussing the options of more aggressive approach with CRRT in the ICU versus comfort care, patient and family decided to transition to comfort care with anticipated hospital death.   SignedDelice Bison, DO 2018-07-25, 10:43 AM

## 2018-07-31 NOTE — Progress Notes (Signed)
Nutrition Brief Note  Chart reviewed. Pt now transitioning to comfort care.  No further nutrition interventions warranted at this time.  Please re-consult as needed.   Soua Lenk A. Ed Rayson, RD, LDN, CDCES Registered Dietitian II Certified Diabetes Care and Education Specialist Pager: [phone] After hours Pager: [phone]  

## 2018-07-31 NOTE — Progress Notes (Signed)
   Subjective: No overnight events. Per nursing, the patient was suctioned multiple times overnight for thick secretions. The patient does not respond to the medical team today and displays agonal breathing. Family is at bedside. All questions were addressed.   Objective:  Vital signs in last 24 hours: Vitals:   07/01/18 2310 07/30/18 0254 2018-07-30 0300 Jul 30, 2018 0632  BP:    (!) 89/60  Pulse: 80  65 69  Resp: 20  12 14   Temp:    97.8 F (36.6 C)  TempSrc:    Oral  SpO2: (!) 89% (!) 88%  (!) 82%  Weight:      Height:       Gen: lying in bed, unresponsive to voice or touch Pulm: agonal breathing, on trach collar  Assessment/Plan:  Principal Problem:   AKI (acute kidney injury) ([place]) Active Problems:   Diabetes mellitus type 2, insulin dependent (HCC)   Body mass index (BMI) of 40.1-44.9 in adult (HCC)   HTN (hypertension), benign   Chronic diastolic heart failure, grade 2   Acute on chronic respiratory failure with hypoxia (HCC)   Sleep apnea   Goals of care, counseling/discussion   CKD (chronic kidney disease) stage 4, GFR 15-29 ml/min (HCC)   Palliative care by specialist   Hypoxemic respiratory failure, chronic (HCC)   Chronic kidney disease (CKD), stage V ([place])  Melody Gray a 48 year old female with apast medical history ofHFpEF,trach dependent OSH, HTN, CKD 4, T2DM, and chronic respiratory failure on 2 L O2 [place] admitted for acute on chronic renal failure.Patient's renal function continued to worsen despite trials of IVF and high dose lasix. Due to multiple comorbidities, she is not a candidate for dialysis. Palliative care was consulted for [place]. After discussing the options of more aggressive approach for with CRRT in the ICU versus comfort care, patient and family have decided to transition to comfort care with anticipated in hospital death.   End of life care - greatly appreciate palliative care following; have added scopolamine patch and mucomyst to help manage  secretions  - patient displays agonal breathing but no signs of discomfort   Dispo: Patient is on comfort care for anticipated hospital death.   Delice Bison, DO 07/30/2018, 6:48 AM Pager: [phone]

## 2018-07-31 NOTE — Significant Event (Signed)
Nurse called to notify me that Melody Gray passed away at 9:02 am.   Delice Bison, DO 2018/07/31, 10:26 AM Pager: [phone]

## 2018-07-31 DEATH — deceased

## 2019-04-11 IMAGING — CR DG CHEST 2V
2 series · 2 of 2 positions shown · non-contrast
Comparison: Multiple prior including [id number]

CLINICAL DATA: 47-year-old female with shortness of breath

EXAM:
CHEST - 2 VIEW

[chest lat]
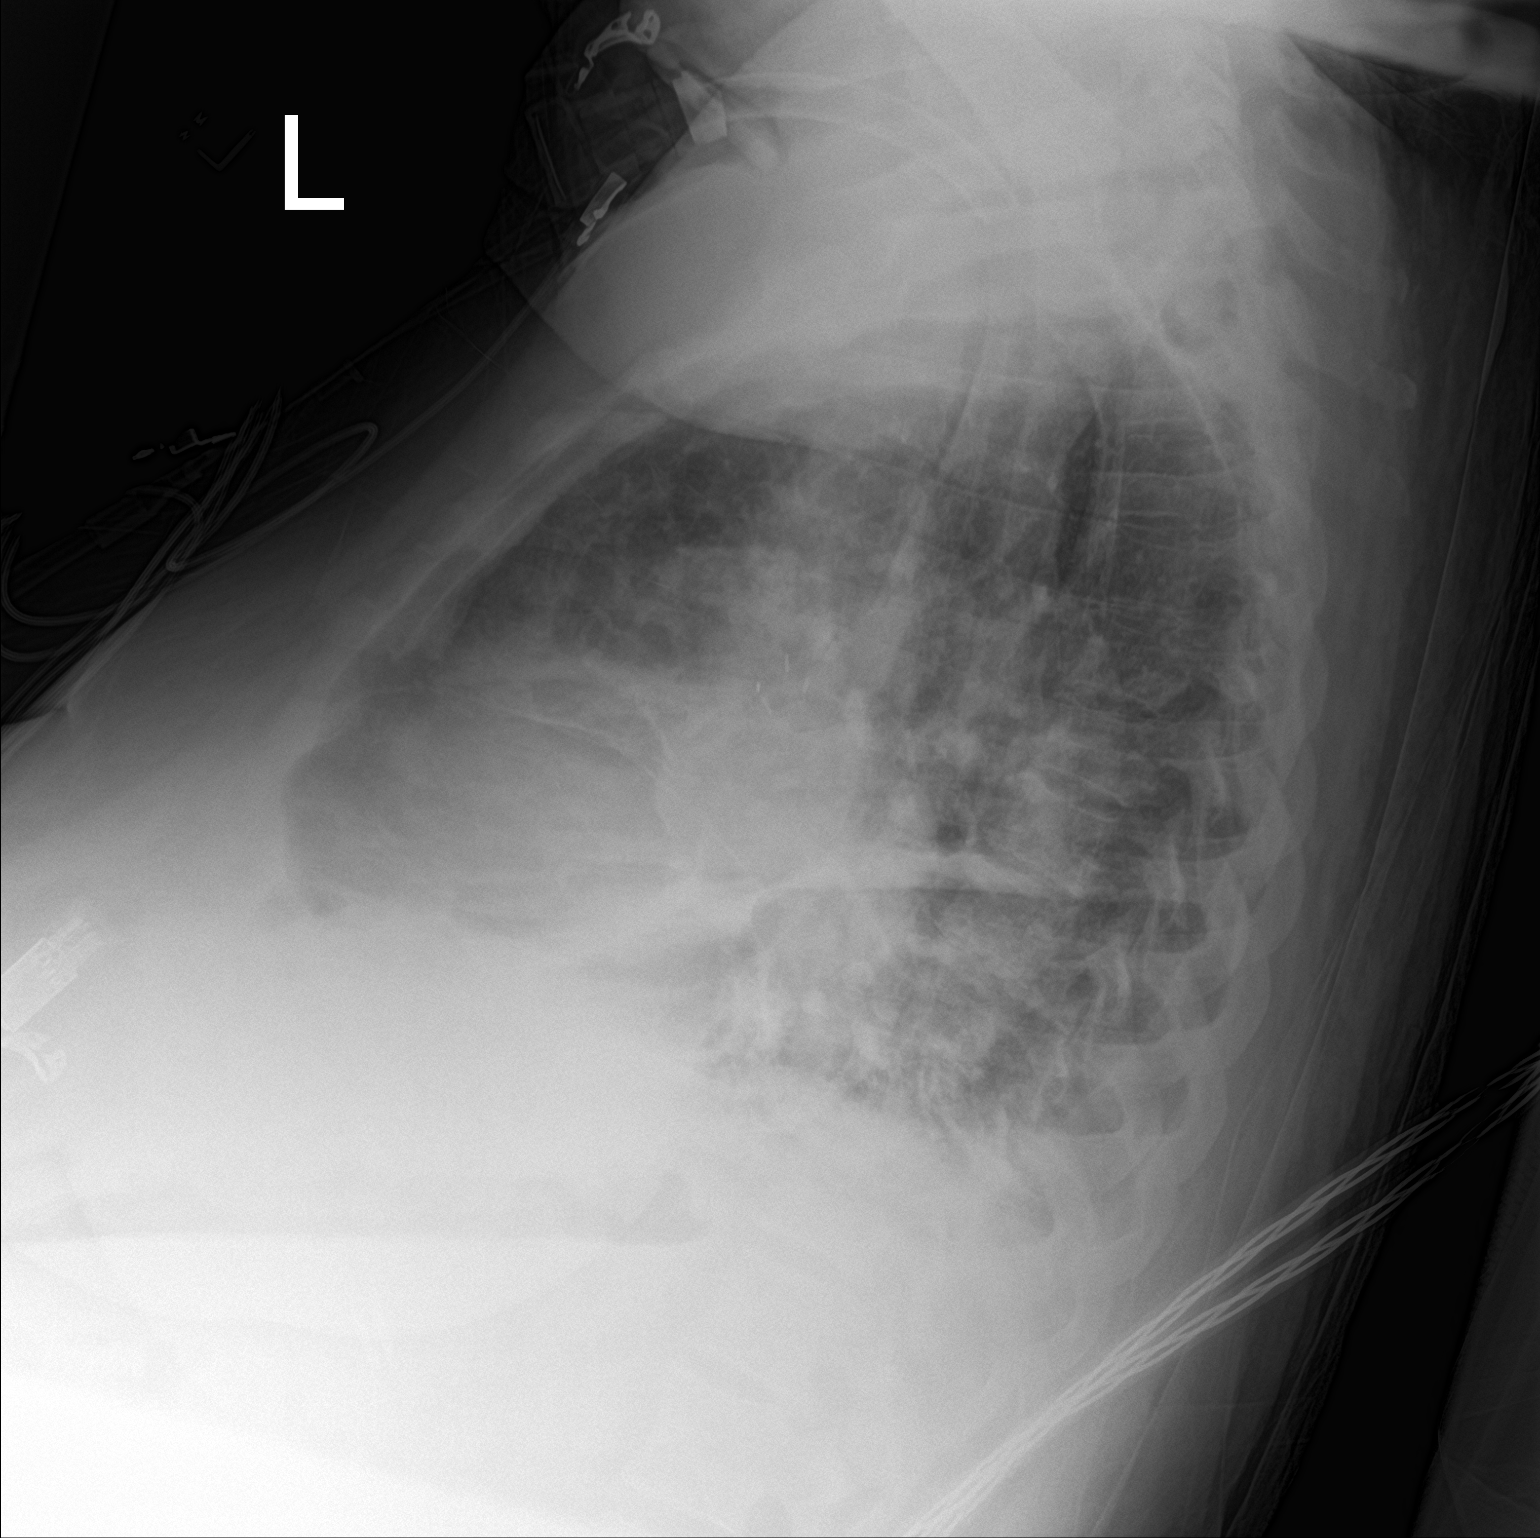

[chest ap]
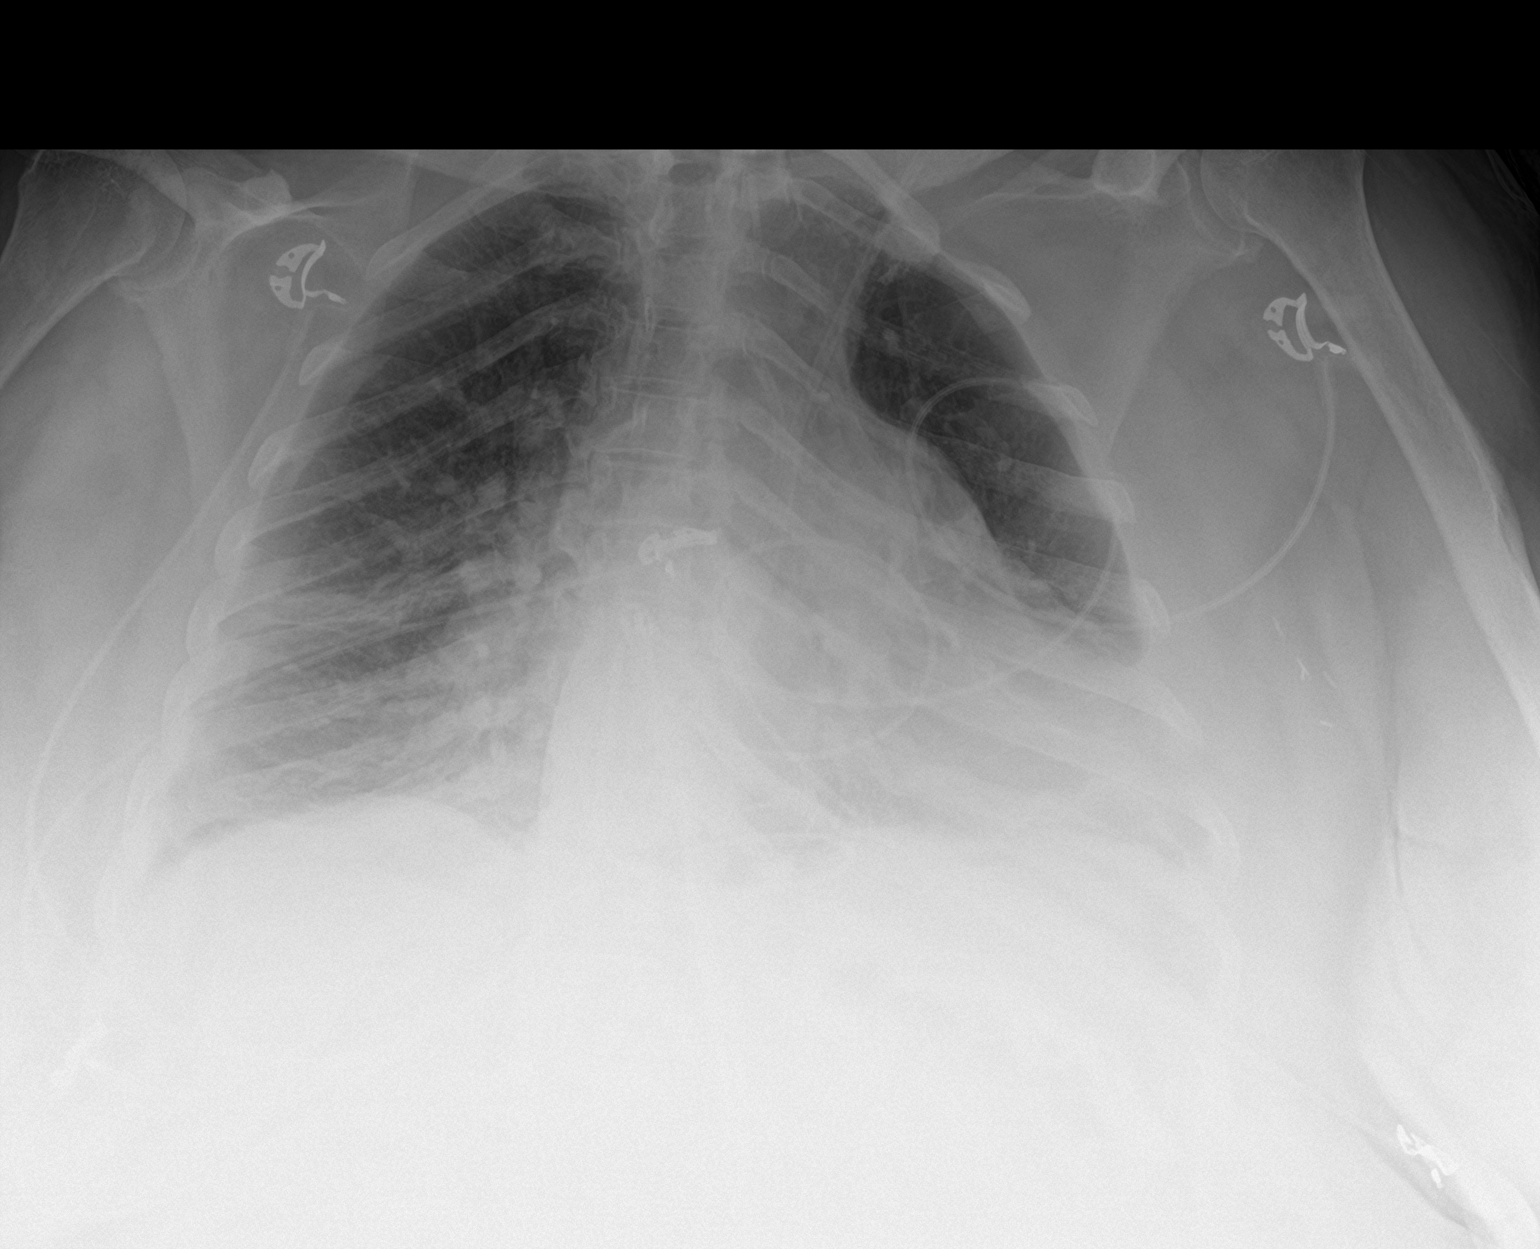

[2 of 2 positions shown; findings below may reference images not displayed]

FINDINGS: Cardiomediastinal silhouette unchanged.

Unchanged position of tracheostomy.

Interlobular septal thickening with fullness in the central
vasculature. Opacity at the left lung base. Blunting of the
costophrenic sulcus on the lateral view. Thickening of the fissures.
IMPRESSION: Similar appearance of the chest x-ray with low lung volumes,
bibasilar opacities, potentially combination of edema,
consolidation/atelectasis, left greater than right pleural effusion.

## 2019-04-12 IMAGING — US US RENAL
2 series · 14 of 25 positions shown · non-contrast
Comparison: None.

CLINICAL DATA: Acute kidney injury

EXAM:
RENAL / URINARY TRACT ULTRASOUND COMPLETE

[Series 1: us renal · 13 of 47 slices shown (1 of 2)]
[im 1/47]
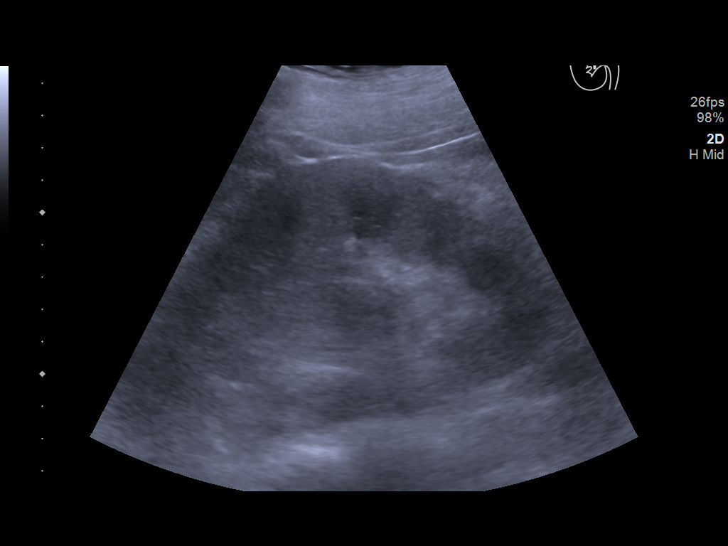
[im 5/47]
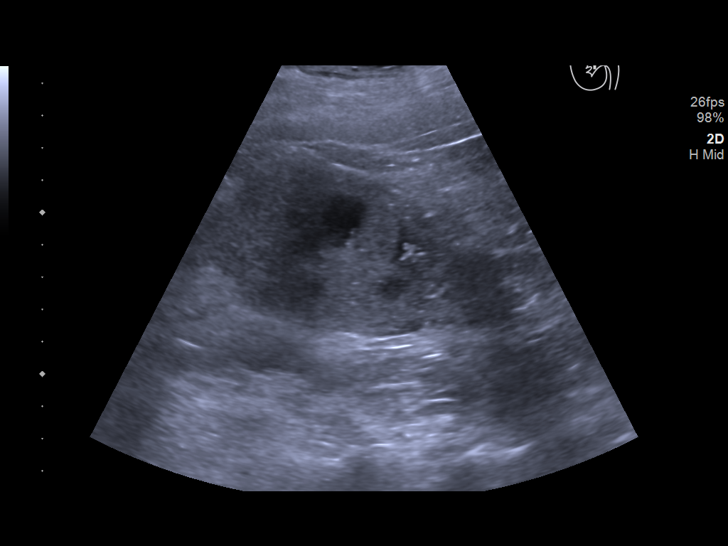
[im 9/47]
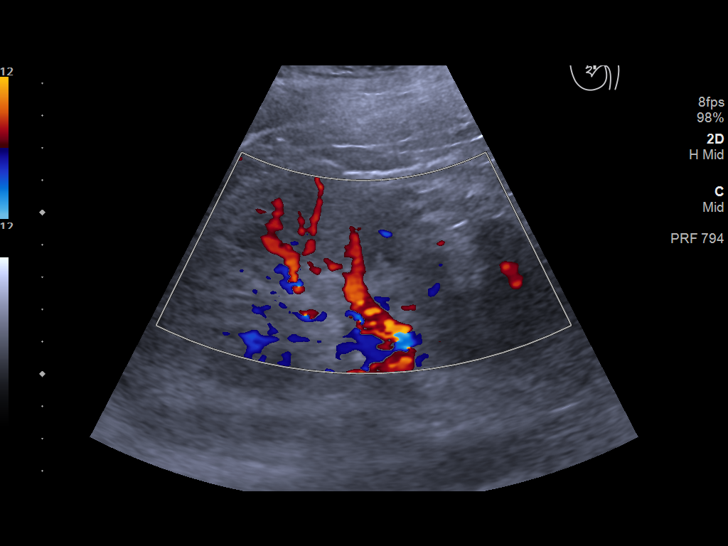
[im 13/47]
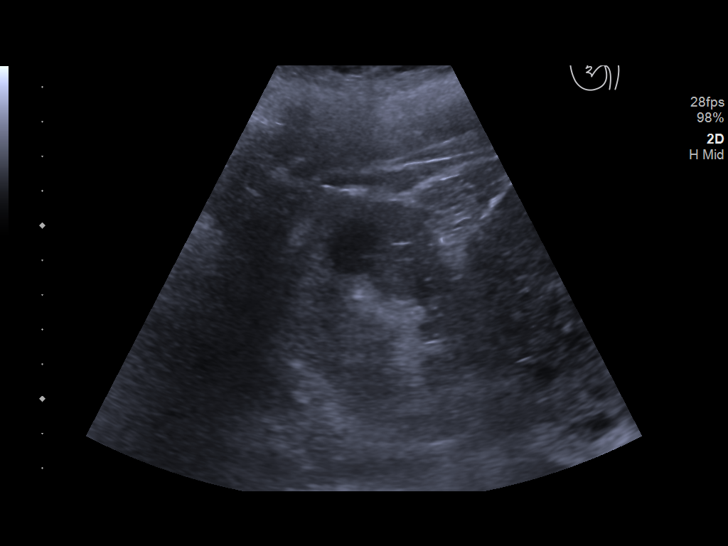
[im 17/47]
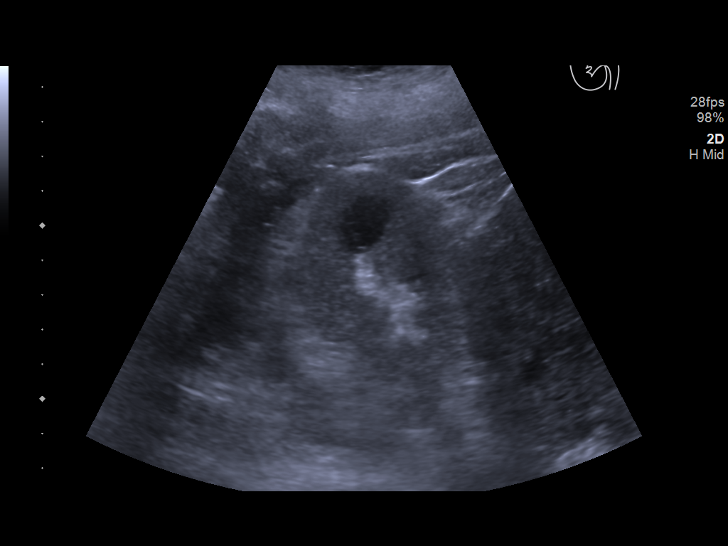
[im 19/47]
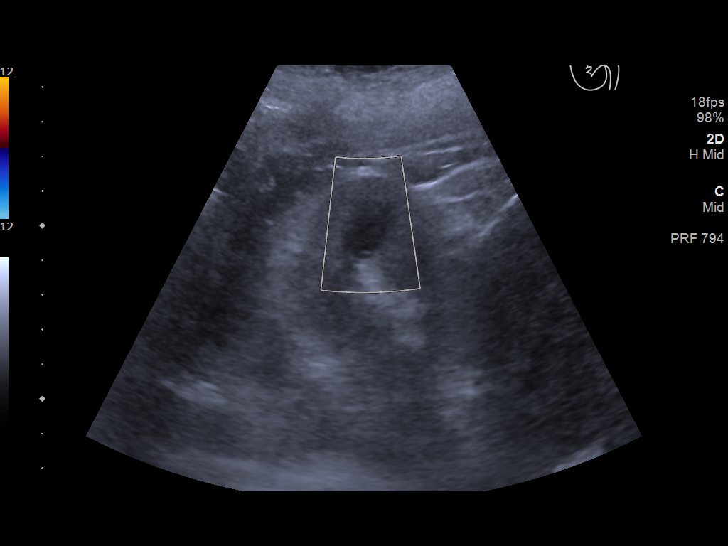
[im 23/47]
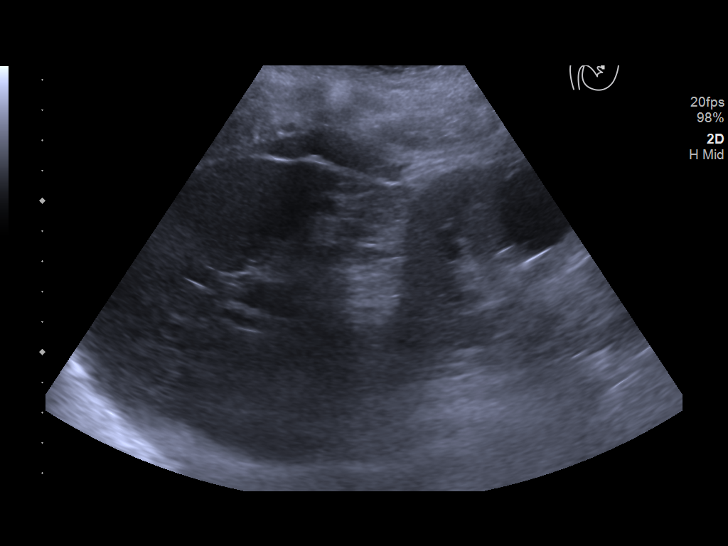
[im 27/47]
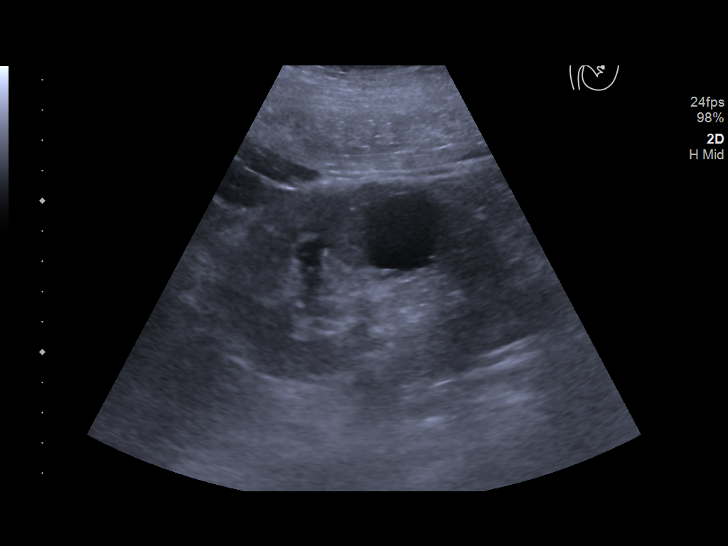
[im 31/47]
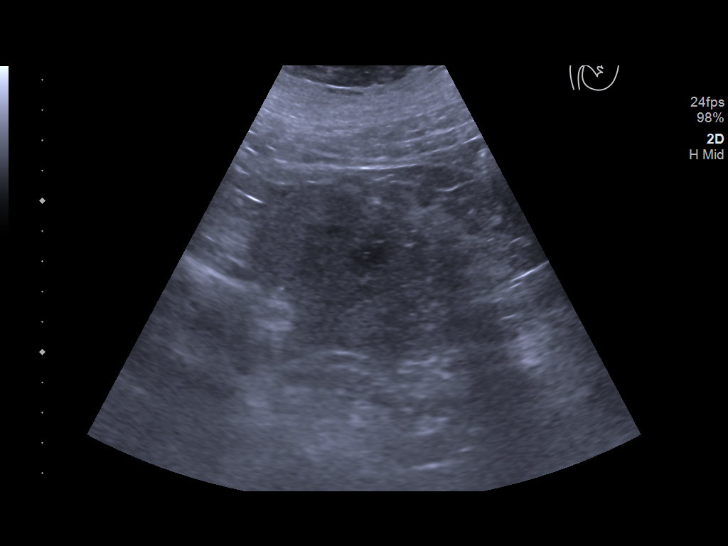
[im 33/47]
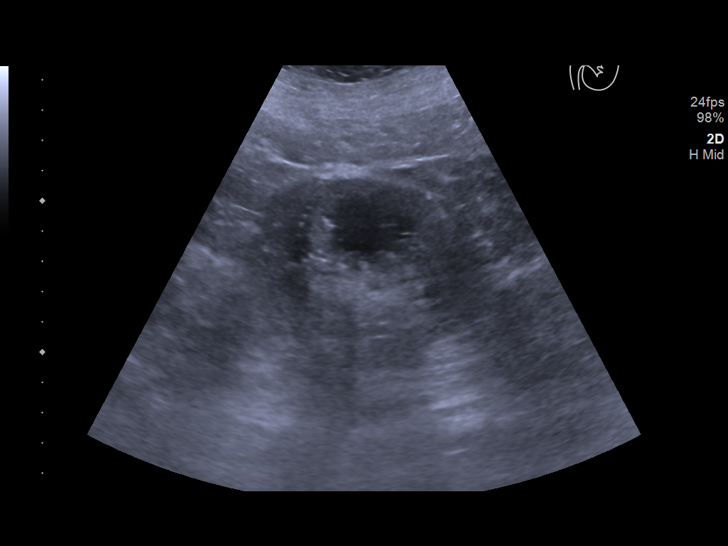
[im 37/47]
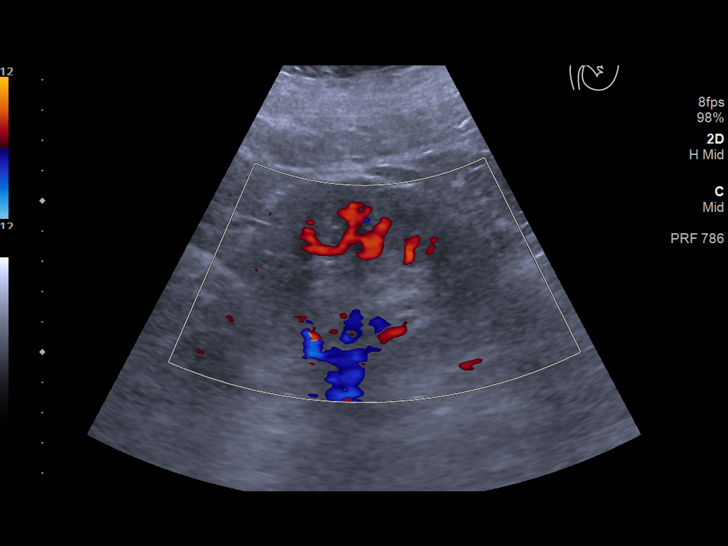
[im 41/47]
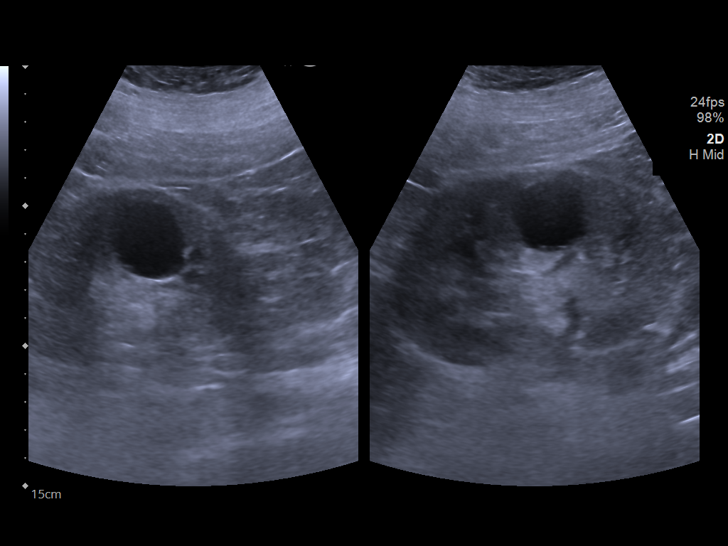
[im 45/47]
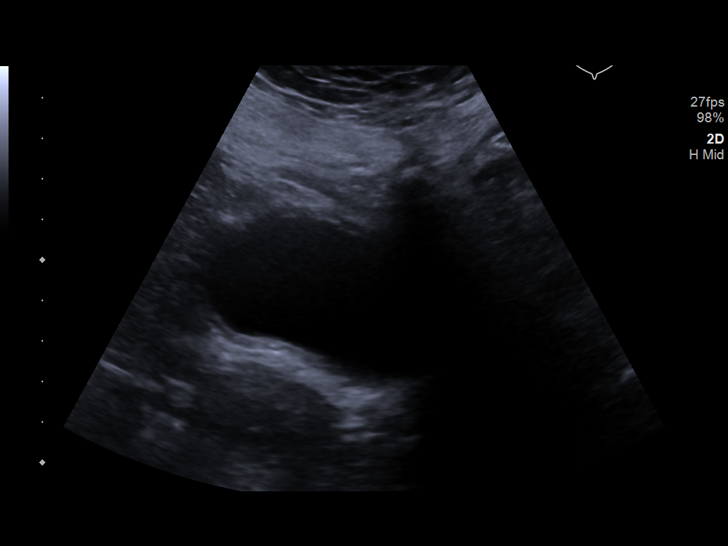

[Series 2: us renal · 1 of 1 slices shown (2 of 2)]
[im 1/1]
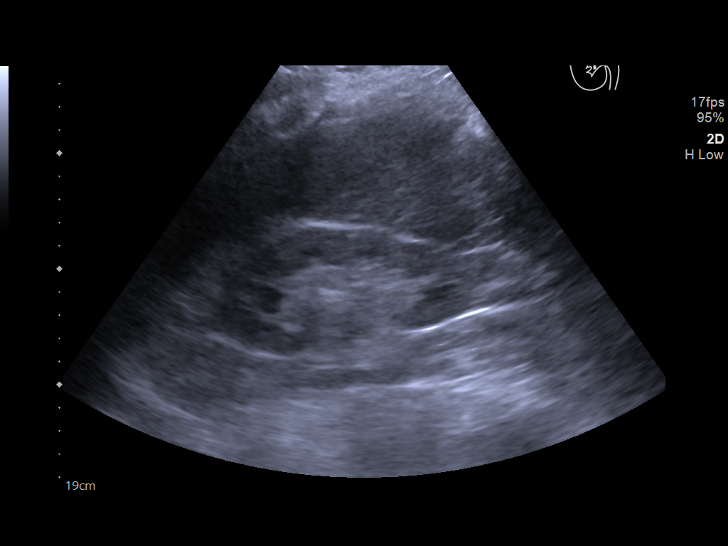

[14 of 25 positions shown; findings below may reference images not displayed]

FINDINGS: Right Kidney:

Renal measurements: 12.0 x 7.8 x 5.6 cm = volume: 272 mL. Mildly
increased echotexture. 1.9 cm cyst in the lower pole. No
hydronephrosis.

Left Kidney:

Renal measurements: 11.1 x 6.4 x 4.5 cm = volume: 164 mL. 2.7 cm
cyst in the midpole. Increased echotexture. No hydronephrosis.

Bladder:

Appears normal for degree of bladder distention.
IMPRESSION: Increased echotexture in the kidneys bilaterally compatible with
chronic medical renal disease.

Small bilateral renal cysts.

No acute findings.  No hydronephrosis.
